# Patient Record
Sex: Male | Born: 1955
Health system: Southern US, Community
[De-identification: ages and names within clinical notes are randomized; demographics above are authoritative.]

## PROBLEM LIST (undated history)

## (undated) DIAGNOSIS — I10 Essential (primary) hypertension: Secondary | ICD-10-CM

## (undated) DIAGNOSIS — R51 Headache: Secondary | ICD-10-CM

## (undated) DIAGNOSIS — Z720 Tobacco use: Secondary | ICD-10-CM

## (undated) DIAGNOSIS — R519 Headache, unspecified: Secondary | ICD-10-CM

## (undated) DIAGNOSIS — N289 Disorder of kidney and ureter, unspecified: Secondary | ICD-10-CM

## (undated) DIAGNOSIS — E669 Obesity, unspecified: Secondary | ICD-10-CM

## (undated) DIAGNOSIS — E785 Hyperlipidemia, unspecified: Secondary | ICD-10-CM

## (undated) DIAGNOSIS — J189 Pneumonia, unspecified organism: Secondary | ICD-10-CM

## (undated) DIAGNOSIS — F1010 Alcohol abuse, uncomplicated: Secondary | ICD-10-CM

## (undated) DIAGNOSIS — D472 Monoclonal gammopathy: Secondary | ICD-10-CM

## (undated) DIAGNOSIS — H9190 Unspecified hearing loss, unspecified ear: Secondary | ICD-10-CM

## (undated) DIAGNOSIS — R600 Localized edema: Secondary | ICD-10-CM

## (undated) DIAGNOSIS — G4730 Sleep apnea, unspecified: Secondary | ICD-10-CM

## (undated) DIAGNOSIS — I509 Heart failure, unspecified: Secondary | ICD-10-CM

## (undated) DIAGNOSIS — K219 Gastro-esophageal reflux disease without esophagitis: Secondary | ICD-10-CM

## (undated) DIAGNOSIS — Z974 Presence of external hearing-aid: Secondary | ICD-10-CM

## (undated) DIAGNOSIS — K759 Inflammatory liver disease, unspecified: Secondary | ICD-10-CM

## (undated) HISTORY — DX: Monoclonal gammopathy: D47.2

## (undated) HISTORY — DX: Localized edema: R60.0

## (undated) HISTORY — DX: Disorder of kidney and ureter, unspecified: N28.9

## (undated) HISTORY — PX: OTHER SURGICAL HISTORY: SHX169

## (undated) HISTORY — PX: EYE SURGERY: SHX253

## (undated) HISTORY — PX: COLONOSCOPY: SHX174

## (undated) HISTORY — DX: Heart failure, unspecified: I50.9

---

## 1998-09-27 ENCOUNTER — Emergency Department (HOSPITAL_COMMUNITY): Admission: EM | Admit: 1998-09-27 | Discharge: 1998-09-27 | Payer: Self-pay | Admitting: Emergency Medicine

## 1998-09-27 ENCOUNTER — Encounter: Payer: Self-pay | Admitting: Emergency Medicine

## 2006-01-13 HISTORY — PX: OTHER SURGICAL HISTORY: SHX169

## 2006-01-25 ENCOUNTER — Inpatient Hospital Stay (HOSPITAL_COMMUNITY): Admission: RE | Admit: 2006-01-25 | Discharge: 2006-02-02 | Payer: Self-pay | Admitting: Orthopedic Surgery

## 2006-01-26 ENCOUNTER — Ambulatory Visit: Payer: Self-pay | Admitting: Infectious Diseases

## 2006-02-21 ENCOUNTER — Emergency Department (HOSPITAL_COMMUNITY): Admission: EM | Admit: 2006-02-21 | Discharge: 2006-02-21 | Payer: Self-pay | Admitting: Emergency Medicine

## 2006-02-22 ENCOUNTER — Ambulatory Visit (HOSPITAL_COMMUNITY): Admission: RE | Admit: 2006-02-22 | Discharge: 2006-02-22 | Payer: Self-pay | Admitting: Orthopedic Surgery

## 2011-12-17 DIAGNOSIS — I509 Heart failure, unspecified: Secondary | ICD-10-CM

## 2011-12-17 HISTORY — DX: Heart failure, unspecified: I50.9

## 2012-01-10 ENCOUNTER — Inpatient Hospital Stay (HOSPITAL_COMMUNITY)
Admission: EM | Admit: 2012-01-10 | Discharge: 2012-01-13 | DRG: 292 | Disposition: A | Discharge status: Home / Self Care | Payer: 59 | Attending: Internal Medicine | Admitting: Internal Medicine

## 2012-01-10 ENCOUNTER — Encounter (HOSPITAL_COMMUNITY): Payer: Self-pay | Admitting: Emergency Medicine

## 2012-01-10 ENCOUNTER — Emergency Department (HOSPITAL_COMMUNITY): Payer: Self-pay

## 2012-01-10 DIAGNOSIS — Z79899 Other long term (current) drug therapy: Secondary | ICD-10-CM

## 2012-01-10 DIAGNOSIS — N189 Chronic kidney disease, unspecified: Secondary | ICD-10-CM | POA: Diagnosis present

## 2012-01-10 DIAGNOSIS — N184 Chronic kidney disease, stage 4 (severe): Secondary | ICD-10-CM | POA: Diagnosis present

## 2012-01-10 DIAGNOSIS — E669 Obesity, unspecified: Secondary | ICD-10-CM

## 2012-01-10 DIAGNOSIS — E785 Hyperlipidemia, unspecified: Secondary | ICD-10-CM

## 2012-01-10 DIAGNOSIS — I129 Hypertensive chronic kidney disease with stage 1 through stage 4 chronic kidney disease, or unspecified chronic kidney disease: Secondary | ICD-10-CM | POA: Diagnosis present

## 2012-01-10 DIAGNOSIS — I5031 Acute diastolic (congestive) heart failure: Principal | ICD-10-CM | POA: Diagnosis present

## 2012-01-10 DIAGNOSIS — F17200 Nicotine dependence, unspecified, uncomplicated: Secondary | ICD-10-CM | POA: Diagnosis present

## 2012-01-10 DIAGNOSIS — IMO0002 Reserved for concepts with insufficient information to code with codable children: Secondary | ICD-10-CM

## 2012-01-10 DIAGNOSIS — N289 Disorder of kidney and ureter, unspecified: Secondary | ICD-10-CM

## 2012-01-10 DIAGNOSIS — F1010 Alcohol abuse, uncomplicated: Secondary | ICD-10-CM

## 2012-01-10 DIAGNOSIS — E119 Type 2 diabetes mellitus without complications: Secondary | ICD-10-CM | POA: Diagnosis present

## 2012-01-10 DIAGNOSIS — I10 Essential (primary) hypertension: Secondary | ICD-10-CM

## 2012-01-10 DIAGNOSIS — D649 Anemia, unspecified: Secondary | ICD-10-CM

## 2012-01-10 DIAGNOSIS — I509 Heart failure, unspecified: Secondary | ICD-10-CM

## 2012-01-10 DIAGNOSIS — M109 Gout, unspecified: Secondary | ICD-10-CM | POA: Diagnosis present

## 2012-01-10 DIAGNOSIS — Z720 Tobacco use: Secondary | ICD-10-CM

## 2012-01-10 HISTORY — DX: Hyperlipidemia, unspecified: E78.5

## 2012-01-10 HISTORY — DX: Alcohol abuse, uncomplicated: F10.10

## 2012-01-10 HISTORY — DX: Tobacco use: Z72.0

## 2012-01-10 HISTORY — DX: Essential (primary) hypertension: I10

## 2012-01-10 LAB — CBC
HCT: 35.9 % — ABNORMAL LOW (ref 39.0–52.0)
HCT: 36.9 % — ABNORMAL LOW (ref 39.0–52.0)
Hemoglobin: 11.6 g/dL — ABNORMAL LOW (ref 13.0–17.0)
Hemoglobin: 11.8 g/dL — ABNORMAL LOW (ref 13.0–17.0)
MCH: 24.4 pg — ABNORMAL LOW (ref 26.0–34.0)
MCH: 24.3 pg — ABNORMAL LOW (ref 26.0–34.0)
MCHC: 32.3 g/dL (ref 30.0–36.0)
MCHC: 32 g/dL (ref 30.0–36.0)
MCV: 75.9 fL — ABNORMAL LOW (ref 78.0–100.0)
Platelets: 202 10*3/uL (ref 150–400)
RDW: 15.2 % (ref 11.5–15.5)
RDW: 15.2 % (ref 11.5–15.5)

## 2012-01-10 LAB — BASIC METABOLIC PANEL
BUN: 15 mg/dL (ref 6–23)
Creatinine, Ser: 2.62 mg/dL — ABNORMAL HIGH (ref 0.50–1.35)
Glucose, Bld: 219 mg/dL — ABNORMAL HIGH (ref 70–99)
Potassium: 3.8 mEq/L (ref 3.5–5.1)
Sodium: 134 mEq/L — ABNORMAL LOW (ref 135–145)

## 2012-01-10 LAB — CARDIAC PANEL(CRET KIN+CKTOT+MB+TROPI)
CK, MB: 2.6 ng/mL (ref 0.3–4.0)
Troponin I: <0.3 ng/mL (ref <0.30)

## 2012-01-10 LAB — DIFFERENTIAL
Lymphocytes Relative: 13 % (ref 12–46)
Neutrophils Relative %: 77 % (ref 43–77)

## 2012-01-10 LAB — HEPATIC FUNCTION PANEL
ALT: 39 U/L (ref 0–53)
Bilirubin, Direct: 0.1 mg/dL (ref 0.0–0.3)
Indirect Bilirubin: 0.6 mg/dL (ref 0.3–0.9)
Total Protein: 7.1 g/dL (ref 6.0–8.3)

## 2012-01-10 LAB — TROPONIN I: Troponin I: <0.3 ng/mL (ref <0.30)

## 2012-01-10 LAB — URINE MICROSCOPIC-ADD ON

## 2012-01-10 LAB — CREATININE, SERUM: GFR calc non Af Amer: 25 mL/min — ABNORMAL LOW (ref >90)

## 2012-01-10 LAB — URINALYSIS, ROUTINE W REFLEX MICROSCOPIC
Leukocytes, UA: NEGATIVE
Protein, ur: >300 mg/dL — AB
Urobilinogen, UA: 1 mg/dL (ref 0.0–1.0)

## 2012-01-10 LAB — GLUCOSE, CAPILLARY: Glucose-Capillary: 217 mg/dL — ABNORMAL HIGH (ref 70–99)

## 2012-01-10 MED ORDER — ASPIRIN EC 81 MG PO TBEC
81.0000 mg | DELAYED_RELEASE_TABLET | Freq: Every day | ORAL | Status: DC
  Administered 2012-01-10 – 2012-01-13 (×4): 81 mg via ORAL
  Filled 2012-01-10 (×5): qty 1

## 2012-01-10 MED ORDER — FUROSEMIDE 10 MG/ML IJ SOLN
40.0000 mg | Freq: Once | INTRAMUSCULAR | Status: AC
  Administered 2012-01-10: 40 mg via INTRAVENOUS
  Filled 2012-01-10 (×2): qty 4

## 2012-01-10 MED ORDER — POTASSIUM CHLORIDE CRYS ER 20 MEQ PO TBCR
20.0000 meq | EXTENDED_RELEASE_TABLET | Freq: Two times a day (BID) | ORAL | Status: DC
  Administered 2012-01-10 – 2012-01-13 (×6): 20 meq via ORAL
  Filled 2012-01-10 (×9): qty 1

## 2012-01-10 MED ORDER — FUROSEMIDE 10 MG/ML IJ SOLN
40.0000 mg | Freq: Two times a day (BID) | INTRAMUSCULAR | Status: DC
  Administered 2012-01-11 – 2012-01-12 (×3): 40 mg via INTRAVENOUS
  Filled 2012-01-10 (×5): qty 4

## 2012-01-10 MED ORDER — VITAMIN B-1 100 MG PO TABS
100.0000 mg | ORAL_TABLET | Freq: Every day | ORAL | Status: DC
Start: 2012-01-10 — End: 2012-01-13
  Administered 2012-01-10 – 2012-01-13 (×4): 100 mg via ORAL
  Filled 2012-01-10 (×5): qty 1

## 2012-01-10 MED ORDER — COLCHICINE 0.6 MG PO TABS
0.6000 mg | ORAL_TABLET | Freq: Every day | ORAL | Status: DC
  Administered 2012-01-10 – 2012-01-13 (×4): 0.6 mg via ORAL
  Filled 2012-01-10 (×5): qty 1

## 2012-01-10 MED ORDER — SODIUM CHLORIDE 0.9 % IJ SOLN: 3.0000 mL | INTRAMUSCULAR | Status: DC | PRN

## 2012-01-10 MED ORDER — ONDANSETRON HCL 4 MG/2ML IJ SOLN: 4.0000 mg | Freq: Four times a day (QID) | INTRAMUSCULAR | Status: DC | PRN

## 2012-01-10 MED ORDER — SODIUM CHLORIDE 0.9 % IJ SOLN
3.0000 mL | Freq: Two times a day (BID) | INTRAMUSCULAR | Status: DC
  Administered 2012-01-10 – 2012-01-12 (×3): 3 mL via INTRAVENOUS
  Administered 2012-01-12: 12:00:00 via INTRAVENOUS
  Administered 2012-01-12 – 2012-01-13 (×2): 3 mL via INTRAVENOUS

## 2012-01-10 MED ORDER — ACETAMINOPHEN 325 MG PO TABS: 650.0000 mg | ORAL_TABLET | ORAL | Status: DC | PRN

## 2012-01-10 MED ORDER — LORAZEPAM 1 MG PO TABS: 1.0000 mg | ORAL_TABLET | ORAL | Status: DC | PRN

## 2012-01-10 MED ORDER — SODIUM CHLORIDE 0.9 % IV SOLN: Freq: Once | INTRAVENOUS | Status: DC

## 2012-01-10 MED ORDER — SODIUM CHLORIDE 0.9 % IV SOLN: 250.0000 mL | INTRAVENOUS | Status: DC | PRN

## 2012-01-10 MED ORDER — CARVEDILOL 3.125 MG PO TABS
3.1250 mg | ORAL_TABLET | Freq: Two times a day (BID) | ORAL | Status: DC
  Administered 2012-01-10 – 2012-01-12 (×4): 3.125 mg via ORAL
  Filled 2012-01-10 (×5): qty 1

## 2012-01-10 MED ORDER — ENOXAPARIN SODIUM 40 MG/0.4ML ~~LOC~~ SOLN
40.0000 mg | SUBCUTANEOUS | Status: DC
  Administered 2012-01-10 – 2012-01-12 (×3): 40 mg via SUBCUTANEOUS
  Filled 2012-01-10 (×5): qty 0.4

## 2012-01-10 MED ORDER — INSULIN ASPART 100 UNIT/ML ~~LOC~~ SOLN
0.0000 [IU] | Freq: Every day | SUBCUTANEOUS | Status: DC
  Administered 2012-01-10 – 2012-01-12 (×2): 3 [IU] via SUBCUTANEOUS

## 2012-01-10 MED ORDER — INSULIN ASPART 100 UNIT/ML ~~LOC~~ SOLN
0.0000 [IU] | Freq: Three times a day (TID) | SUBCUTANEOUS | Status: DC
  Administered 2012-01-11 (×2): 3 [IU] via SUBCUTANEOUS
  Administered 2012-01-11: 5 [IU] via SUBCUTANEOUS
  Administered 2012-01-12 (×3): 3 [IU] via SUBCUTANEOUS
  Administered 2012-01-13: 8 [IU] via SUBCUTANEOUS
  Administered 2012-01-13 (×2): 2 [IU] via SUBCUTANEOUS
  Filled 2012-01-10: qty 3

## 2012-01-10 MED ORDER — FOLIC ACID 1 MG PO TABS
1.0000 mg | ORAL_TABLET | Freq: Every day | ORAL | Status: DC
Start: 2012-01-10 — End: 2012-01-13
  Administered 2012-01-10 – 2012-01-13 (×4): 1 mg via ORAL
  Filled 2012-01-10 (×5): qty 1

## 2012-01-10 NOTE — H&P (Signed)
Hospital Admission Note Date: 01/10/2012  Patient name: [name] Medical record number: [id number] Date of birth: [birth year] Age: 56 y.o. Gender: male PCP: Gara Kroner, MD, MD  Attending physician: Jacquelynn Cree, MD Emergency Contact:  Kyah Rosser (wife) [phone] Code Status: Full  Chief Complaint: Shortness of breath  History of Present Illness: [name] is an 56 y.o. male with PMH of HTN, DM, and alcohol abuse who presented to the hospital with a 2 day history of the gradual onset dyspnea.  He noticed it for the first time when he was at work exerting himself by "running".  No associated fever, chills, but does endorse an occasional cough which is non-productive.  No associated chest pain or tightness.  He is now short of breath with mild exertion.  He has also noticed the recent onset of lower extremity edema.  Upon initial evaluation by the ED physician, he was noted to be non-compliant with his anti-hypertensive medication, and a CXR showed pulmonary edema.  He was referred to the hospitalist service for evaluation of his CHF.  Past Medical History Past Medical History  Diagnosis Date  . Diabetes mellitus   . Hypertension   . Hyperlipidemia   . Gout   . Alcohol abuse   . Tobacco abuse     Past Surgical History Past Surgical History  Procedure Date  . Left knee arthroscopy with generalized joint synovectomy, 01/2006  . Ganglion cyst removal x 2     Meds: Prior to Admission medications   Medication Sig Start Date End Date Taking? Authorizing Provider  colchicine 0.6 MG tablet Take 0.6 mg by mouth daily.   Yes Historical Provider, MD  indomethacin (INDOCIN) 50 MG capsule Take 50 mg by mouth 3 (three) times daily.   Yes Historical Provider, MD  metFORMIN (GLUCOPHAGE) 1000 MG tablet Take 1,000 mg by mouth daily.   Yes Historical Provider, MD  predniSONE (DELTASONE) 20 MG tablet Take 60 mg by mouth daily.   Yes Historical Provider, MD     Allergies: Penicillins  Social History: History   Social History  . Marital Status: Married    Spouse Name: [name]    Number of Children: 2  . Years of Education: 12   Occupational History  . Security    Social History Main Topics  . Smoking status: Current Everyday Smoker -- 0.3 packs/day for 33 years    Types: Cigarettes  . Smokeless tobacco: Never Used  . Alcohol Use: 21.0 oz/week    42 drink(s) per week  . Drug Use: No  . Sexually Active: Not on file   Other Topics Concern  . Not on file   Social History Narrative   Married, lives in [place] with wife.    Family History:  Family History  Problem Relation Age of Onset  . Lung cancer Father   . Diabetes Mother   . Heart disease Mother     Review of Systems: Constitutional: No fever or chills;  Appetite normal; No weight loss, steady weight gain.  HEENT: No blurry vision or diplopia, no pharyngitis or dysphagia CV: No chest pain or arrhythmia.  Resp: + SOB, + cough. GI: No N/V, no diarrhea, no melena or hematochezia.  GU: No dysuria or hematuria.  MSK: no myalgias/arthralgias.  Neuro:  No headache or focal neurological deficits.  Psych: No depression or anxiety.  Endo: No thyroid disease, + DM.  Skin: No rashes or lesions.  Heme: No anemia or blood dyscrasia   Physical Exam:  Blood pressure 163/104, pulse 94, temperature 98.5 F (36.9 C), temperature source Oral, resp. rate 24, SpO2 99.00%. BP 163/104  Pulse 94  Temp(Src) 98.5 F (36.9 C) (Oral)  Resp 24  SpO2 99%  General Appearance:    Alert, cooperative, no distress, appears stated age  Head:    Normocephalic, without obvious abnormality, atraumatic  Eyes:    PERRL, conjunctiva/corneas clear, EOM's intact  Ears:    Normal external ear canals, both ears  Nose:   Nares normal, septum midline, mucosa normal, no drainage    or sinus tenderness  Throat:   Lips, mucosa, and tongue normal; teeth and gums normal  Neck:   Supple, symmetrical, trachea midline, no  adenopathy;       thyroid:  No enlargement/tenderness/nodules; no carotid   bruit or JVD  Back:     Symmetric, no curvature, ROM normal, no CVA tenderness  Lungs:     Crackles bilaterally in bases, respirations unlabored  Chest wall:    No tenderness or deformity  Heart:    Regular rate and rhythm, S1 and S2 normal, no murmur, rub   or gallop  Abdomen:     Obese, soft, non-tender, bowel sounds active all four quadrants, no masses, no organomegaly  Extremities:   Extremities with 2+ edema  Pulses:   2+ and symmetric all extremities  Skin:   Skin color, texture, turgor normal, no rashes or lesions  Neurologic:   Non-focal   Lab results: Basic Metabolic Panel:  Lab [id number] 1438  NA 134*  K 3.8  CL 100  CO2 25  GLUCOSE 219*  BUN 15  CREATININE 2.62*  CALCIUM 8.9  MG --  PHOS --   GFR CrCl is unknown because there is no height on file for the current visit.  CBC:  Lab 01/10/12 1438  WBC 10.6*  NEUTROABS 8.1*  HGB 11.6*  HCT 35.9*  MCV 75.4*  PLT 202   Cardiac Enzymes:  Lab 01/10/12 1437  CKTOTAL --  CKMB --  CKMBINDEX --  TROPONINI <0.30   BNP:   Ref. Range 01/10/2012 14:36  Pro B Natriuretic peptide (BNP) Latest Range: 0-125 pg/mL 834.4 (H)   CBG:  Lab 01/10/12 1403  GLUCAP 217*    Imaging results:  Dg Chest Portable 1 View  01/10/2012  *RADIOLOGY REPORT*  Clinical Data: Shortness of breath since Saturday.  Coughing. History of hypertension.  History of diabetes.  Smoking.  PORTABLE CHEST - 1 VIEW  Comparison: 04/02/2008  Findings: The heart is enlarged.  There is central pulmonary edema. No focal consolidations or pleural effusions.  Visualized osseous structures have a normal appearance.  IMPRESSION: Cardiomegaly and pulmonary edema.  Original Report Authenticated By: Glenice Bow, M.D.    Assessment & Plan: Principal Problem:  *CHF (congestive heart failure) The patient likely has acute CHF (uncertain if this is systolic versus diastolic or a  combination of both). Will admit to telemetry, diurese with 40 mg of lasix IV Q 12 hours, and obtain a 2 D Echocardiogram for further evaluation.  He may have hypertensive cardiomyopathy related to his medical non-compliance, or alcohol related cardiomyopathy.  We will cycle cardiac markers Q 8 hours x 3 sets. Active Problems:  Hypertension Uncontrolled with history of medical non-compliance.  Will start on Lasix and Coreg.  Will hold off on ACE-I or ARB given current impairment in renal function.  Hyperlipidemia Not on any lipid lowering medicines.  Will check a FLP in the morning.  Gout Continue daily  colchicine.  Hold NSAIDS due to renal impairment.  Alcohol abuse We will monitor for signs of alcohol withdrawal.  Will provide thiamine and folic acid supplementation.  Tobacco abuse Will ask the nursing staff to provide tobacco cessation counseling.  Obesity Will ask the dietician to evaluate.  Will weigh to get BMI.  Chronic kidney disease Baseline renal function not known.  ? Component of acute versus CKD from uncontrolled HTN and/or DM.  Will monitor renal function closely, especially with diuresis.  Diabetes  Will check hemoglobin A1c and monitor.  Start SSI.  Hold Metformin for now.  Prophylaxis: Lovenox for DVT prophylaxis.  Time Spent On Admission: 1 hour.  Lynnett Langlinais 01/10/2012, 3:53 PM Pager [phone]

## 2012-01-10 NOTE — ED Provider Notes (Signed)
History     CSN: [id number]  Arrival date & time 01/10/12  1345   First MD Initiated Contact with Patient 01/10/12 1357      Chief Complaint  Patient presents with  . Shortness of Breath    (Consider location/radiation/quality/duration/timing/severity/associated sxs/prior treatment) Patient is a 56 y.o. male presenting with shortness of breath. The history is provided by the patient.  Shortness of Breath  Associated symptoms include shortness of breath.  He has noticed dyspnea for the last 2 days. It is worse with exertion and worse with laying flat. Last night he was unable to sleep. There is no associated chest pain, heaviness, tightness, or pressure. He denies nausea and vomiting but did have one episode of diaphoresis. He's had some mild left-sided abdominal discomfort which is improved with passing flatus. Symptoms are severe in that he cannot even walk across a room without getting dyspneic. He has not had similar problems before. He does have a history of hypertension diabetes and smokes cigars on average of one week. He went to see his PCP who referred him to the emergency department. Of note, the patient states that he is a heavy consumer of beer although not quantify how much. He also states he has not been taking his medication for at least several months.  Past Medical History  Diagnosis Date  . Diabetes mellitus   . Hypertension   . Hyperlipidemia     No past surgical history on file.  No family history on file.  History  Substance Use Topics  . Smoking status: Not on file  . Smokeless tobacco: Not on file  . Alcohol Use:       Review of Systems  Respiratory: Positive for shortness of breath.   All other systems reviewed and are negative.    Allergies  Penicillins  Home Medications  No current outpatient prescriptions on file.  BP 163/104  Pulse 94  Temp(Src) 98.5 F (36.9 C) (Oral)  Resp 24  SpO2 99%  Physical Exam  Nursing note and vitals  reviewed.  56 year old male who appears dyspneic at rest. He is obese. Vital signs are significant for tachypnea with respiratory rate of 24 and hypertension with blood pressure 163/104. Oxygen saturation is 95% which is normal. Head is normocephalic and atraumatic. PERRLA, EOMI. Oropharynx is clear. Neck is supple with out adenopathy. JVD is present at 90. Back has a 1+ presacral edema. Lungs are clear without rales, wheezes, or rhonchi. Heart has regular rate and rhythm without murmur. There is no chest wall tenderness. Abdomen is obese, soft, nontender without masses or hepatosplenomegaly. Easily reducible small umbilical hernia is present. Extremities have 2+ edema without cyanosis her toe range of motion is present. Skin is warm and dry without rash. Neurologic: Mental status is normal, cranial nerves are intact, there no focal motor or sensory deficits.  ED Course  Procedures (including critical care time)   Labs Reviewed  URINALYSIS, ROUTINE W REFLEX MICROSCOPIC  CBC  DIFFERENTIAL  BASIC METABOLIC PANEL  PRO B NATRIURETIC PEPTIDE  TROPONIN I   No results found.   Date: 01/10/2012  Rate: 100  Rhythm: normal sinus rhythm  QRS Axis: normal  Intervals: normal  ST/T Wave abnormalities: nonspecific T wave changes  Conduction Disutrbances:none  Narrative Interpretation: Low voltage QRS. Nonspecific diffuse T wave flattening. No old ECG available for comparison.  Old EKG Reviewed: none available  Chest x-ray confirms congestive heart failure with pulmonary edema. He is given a dose of Lasix intravenously. BNP  has come back elevated but not as high as I would've expected. He is also noted to have renal insufficiency and anemia which will need further evaluation. Case is discussed with Dr. Rockne Menghini who agrees to admit the patient.  1. CHF (congestive heart failure)   2. Anemia   3. Renal insufficiency   4. Hypertension       MDM  Exertional dyspnea which seems most likely to be due to  congestive heart failure. He shows definite evidence of fluid overload and neck vein distention as well. Cardiac markers, chest x-ray, ECG, and electrolyte panel will be checked.        Delora Fuel, MD [id number]

## 2012-01-10 NOTE — ED Notes (Signed)
Per EMS pt from DR office who had sudden SOB that started Saturday and is only SOB when he walks. Pt denies chest pain at this time. EKG normal sinus with PVC. Hypertensive at 172/109. CBG 223. Sats 95% on room air. Pt in no acute distress.

## 2012-01-10 NOTE — ED Notes (Signed)
CBG 186 

## 2012-01-10 NOTE — ED Notes (Signed)
Attempted to call report to floor, RN unable to take report at this time, states she has to discharge two patients and will then call me back.

## 2012-01-11 ENCOUNTER — Other Ambulatory Visit: Payer: Self-pay

## 2012-01-11 ENCOUNTER — Inpatient Hospital Stay (HOSPITAL_COMMUNITY): Payer: Self-pay

## 2012-01-11 LAB — LIPID PANEL
HDL: 49 mg/dL (ref >39)
LDL Cholesterol: 138 mg/dL — ABNORMAL HIGH (ref 0–99)
Total CHOL/HDL Ratio: 5 RATIO
Triglycerides: 289 mg/dL — ABNORMAL HIGH (ref <150)

## 2012-01-11 LAB — BASIC METABOLIC PANEL
BUN: 16 mg/dL (ref 6–23)
CO2: 26 mEq/L (ref 19–32)
Calcium: 9.3 mg/dL (ref 8.4–10.5)
Chloride: 100 mEq/L (ref 96–112)
Creatinine, Ser: 2.73 mg/dL — ABNORMAL HIGH (ref 0.50–1.35)
Glucose, Bld: 178 mg/dL — ABNORMAL HIGH (ref 70–99)

## 2012-01-11 LAB — CARDIAC PANEL(CRET KIN+CKTOT+MB+TROPI)
CK, MB: 2.5 ng/mL (ref 0.3–4.0)
Relative Index: 1.2 (ref 0.0–2.5)
Total CK: 209 U/L (ref 7–232)
Total CK: 190 U/L (ref 7–232)
Troponin I: <0.3 ng/mL (ref <0.30)
Troponin I: <0.3 ng/mL (ref <0.30)

## 2012-01-11 LAB — GLUCOSE, CAPILLARY: Glucose-Capillary: 223 mg/dL — ABNORMAL HIGH (ref 70–99)

## 2012-01-11 MED ORDER — INSULIN ASPART PROT & ASPART (70-30 MIX) 100 UNIT/ML ~~LOC~~ SUSP
10.0000 [IU] | Freq: Two times a day (BID) | SUBCUTANEOUS | Status: DC
  Administered 2012-01-11 – 2012-01-12 (×2): 10 [IU] via SUBCUTANEOUS
  Filled 2012-01-11: qty 3

## 2012-01-11 MED ORDER — ZOLPIDEM TARTRATE 5 MG PO TABS
5.0000 mg | ORAL_TABLET | Freq: Every evening | ORAL | Status: DC | PRN
  Administered 2012-01-11 – 2012-01-13 (×2): 5 mg via ORAL
  Filled 2012-01-11 (×2): qty 1

## 2012-01-11 MED ORDER — SIMVASTATIN 20 MG PO TABS
20.0000 mg | ORAL_TABLET | Freq: Every day | ORAL | Status: DC
  Administered 2012-01-11 – 2012-01-13 (×3): 20 mg via ORAL
  Filled 2012-01-11 (×4): qty 1

## 2012-01-11 NOTE — Progress Notes (Signed)
   CARE MANAGEMENT NOTE 01/11/2012  Patient:  KVION, LELL   Account Number:  [id number]  Date Initiated:  01/11/2012  Documentation initiated by:  [facility]  Subjective/Objective Assessment:   ADMITTED W/SOB.     Action/Plan:   FROM HOME W/SPOUSE.HAS PCP,PHARMACY,TRANSP.   Anticipated DC Date:  01/13/2012   Anticipated DC Plan:  [place]  CM consult  Medication Assistance      Choice offered to / List presented to:  C-1 Patient           Status of service:  In process, will continue to follow Medicare Important Message given?   (If response is "NO", the following Medicare IM given date fields will be blank) Date Medicare IM given:   Date Additional Medicare IM given:    Discharge Disposition:    Per UR Regulation:  Reviewed for med. necessity/level of care/duration of stay  Comments:  01/11/12 Maryon Kemnitz RN,BSN NCM [phone] SPOKE TO PATIENT ABOUT HHC.[facility] CHOSEN TO FOLLOW.PATIENT HAS QUES ABOUT COST,SERVICES.SUSAN DALE INFORMED & WILL TALK TO PATIENT ABOUT COST,& FINANCIAL FORM.RECOMMEND HHRN,CSW @ D/C IF MD,& PATIENT AGREE.QUALIFIES FOR INDIGENT FUNDS IF NEEDED.PROVIDED W/$4 [facility] MED LIST.

## 2012-01-11 NOTE — Progress Notes (Addendum)
Subjective: "I am feeling a whole lot better". Pt ambulating in room , gait steady, no SOB. Denies chest pain  Objective: Vital signs Filed Vitals:   01/10/12 1801 01/10/12 2045 01/10/12 2115 01/11/12 0436  BP: 156/101 176/110 153/91 129/85  Pulse: 91 85 98 80  Temp: 98.6 F (37 C) 97.6 F (36.4 C) 99.2 F (37.3 C) 98.4 F (36.9 C)  TempSrc: Oral Oral Oral Oral  Resp: 18 20 20 18   Height: 6' (1.829 m)     Weight: 143.654 kg (316 lb 11.2 oz)   139 kg (306 lb 7 oz)  SpO2: 95%  96% 94%   Weight change:  Last BM Date: 01/10/12  Intake/Output from previous day: 02/25 0701 - 02/26 0700 In: 240 [P.O.:240] Out: -  Total I/O In: 640 [P.O.:640] Out: -    Physical Exam: General: Alert, awake, oriented x3, in no acute distress.  HEENT: No bruits, no goiter. PERRL. Poor dentition. Mucus membranes mouth moist/pink Heart: Regular rate and rhythm, without murmurs, rubs, gallops. Lungs: Normal effort. Breath sounds with very fine crackles bases particularly on left otherwise clear to auscultation bilaterally. Abdomen: Obese, Soft, nontender, nondistended, positive bowel sounds. Extremities: No clubbing cyanosis  with positive pedal pulses. Trace to 1+LEE Neuro: Grossly intact, nonfocal. Speech clear. Facial symmetry    Lab Results: Basic Metabolic Panel:  Basename 01/11/12 0439 01/10/12 1845 01/10/12 1438  NA 135 -- 134*  K 3.8 -- 3.8  CL 100 -- 100  CO2 26 -- 25  GLUCOSE 178* -- 219*  BUN 16 -- 15  CREATININE 2.73* 2.68* --  CALCIUM 9.3 -- 8.9  MG -- -- --  PHOS -- -- --   Liver Function Tests:  Basename 01/10/12 1845  AST 22  ALT 39  ALKPHOS 89  BILITOT 0.7  PROT 7.1  ALBUMIN 2.6*   No results found for this basename: LIPASE:2,AMYLASE:2 in the last 72 hours No results found for this basename: AMMONIA:2 in the last 72 hours CBC:  Basename 01/10/12 1845 01/10/12 1438  WBC 10.5 10.6*  NEUTROABS -- 8.1*  HGB 11.8* 11.6*  HCT 36.9* 35.9*  MCV 75.9* 75.4*  PLT  206 202   Cardiac Enzymes:  Basename 01/11/12 0752 01/11/12 0025 01/10/12 1627  CKTOTAL 190 209 223  CKMB 2.3 2.5 2.6  CKMBINDEX -- -- --  TROPONINI <0.30 <0.30 <0.30   BNP:  Basename 01/10/12 1436  PROBNP 834.4*   D-Dimer: No results found for this basename: DDIMER:2 in the last 72 hours CBG:  Basename 01/11/12 1123 01/11/12 0717 01/10/12 2024 01/10/12 1711 01/10/12 1403  GLUCAP 223* 196* 258* 186* 217*   Hemoglobin A1C: No results found for this basename: HGBA1C in the last 72 hours Fasting Lipid Panel:  Basename 01/11/12 0439  CHOL 245*  HDL 49  LDLCALC 138*  TRIG 289*  CHOLHDL 5.0  LDLDIRECT --   Thyroid Function Tests:  Basename 01/10/12 1845  TSH 3.289  T4TOTAL --  FREET4 --  T3FREE --  THYROIDAB --   Anemia Panel: No results found for this basename: VITAMINB12,FOLATE,FERRITIN,TIBC,IRON,RETICCTPCT in the last 72 hours Coagulation: No results found for this basename: LABPROT:2,INR:2 in the last 72 hours Urine Drug Screen: Drugs of Abuse  No results found for this basename: labopia, cocainscrnur, labbenz, amphetmu, thcu, labbarb    Alcohol Level: No results found for this basename: ETH:2 in the last 72 hours Urinalysis:  Basename 01/10/12 1404  COLORURINE YELLOW  LABSPEC 1.023  PHURINE 6.0  GLUCOSEU >1000*  HGBUR TRACE*  BILIRUBINUR NEGATIVE  KETONESUR NEGATIVE  PROTEINUR >300*  UROBILINOGEN 1.0  NITRITE NEGATIVE  LEUKOCYTESUR NEGATIVE   Misc. Labs:  No results found for this or any previous visit (from the past 240 hour(s)).  Studies/Results: Dg Chest Portable 1 View  01/10/2012  *RADIOLOGY REPORT*  Clinical Data: Shortness of breath since Saturday.  Coughing. History of hypertension.  History of diabetes.  Smoking.  PORTABLE CHEST - 1 VIEW  Comparison: 04/02/2008  Findings: The heart is enlarged.  There is central pulmonary edema. No focal consolidations or pleural effusions.  Visualized osseous structures have a normal appearance.   IMPRESSION: Cardiomegaly and pulmonary edema.  Original Report Authenticated By: Glenice Bow, M.D.    Medications: Scheduled Meds:   . aspirin EC  81 mg Oral Daily  . carvedilol  3.125 mg Oral BID WC  . colchicine  0.6 mg Oral Daily  . enoxaparin  40 mg Subcutaneous Q24H  . folic acid  1 mg Oral Daily  . furosemide  40 mg Intravenous Once  . furosemide  40 mg Intravenous Q12H  . insulin aspart  0-15 Units Subcutaneous TID WC  . insulin aspart  0-5 Units Subcutaneous QHS  . potassium chloride  20 mEq Oral BID  . sodium chloride  3 mL Intravenous Q12H  . thiamine  100 mg Oral Daily  . DISCONTD: sodium chloride   Intravenous Once   Continuous Infusions:  PRN Meds:.sodium chloride, acetaminophen, LORazepam, ondansetron (ZOFRAN) IV, sodium chloride  Assessment/Plan:  Principal Problem:  *CHF (congestive heart failure) Active Problems:  Hypertension  Hyperlipidemia  Gout  Alcohol abuse  Tobacco abuse  Obesity  Chronic kidney disease  Diabetes mellitus  *CHF (congestive heart failure)  The patient likely has acute CHF (uncertain if this is systolic versus diastolic or a combination of both). Improved. 2decho results pending.  He may have hypertensive cardiomyopathy related to his medical non-compliance, or alcohol related cardiomyopathy.  CE neg to date. Denies CP Will continue lasix, strict I&O's daily weights. Today's weight 139kg down from 143.6kg at admission. Will evaluate and likely reduce lasix tomorrow.  Active Problems:  Hypertension  Uncontrolled with history of medical non-compliance. Will start on Lasix and Coreg. Will hold off on ACE-I or ARB given current impairment in renal function. SBP range 129-176. DBP range 110-85. Each successive reading has improved so will leave lasix and coreg as they are and evaluate BP. Will provide prn hydralazine. Hyperlipidemia  Not on any lipid lowering medicines. Will add statin. Elevated total chol and triglyserides  Gout    Continue daily colchicine. Hold NSAIDS due to renal impairment.  Alcohol abuse  We will monitor for signs of alcohol withdrawal. Will provide thiamine and folic acid supplementation.  Tobacco abuse  Will ask the nursing staff to provide tobacco cessation counseling.  Obesity  Will ask the dietician to evaluate. Will weigh to get BMI.  Chronic kidney disease  Baseline renal function not known. ? Component of acute versus CKD from uncontrolled HTN and/or DM. Will monitor renal function closely, especially with diuresis.  Diabetes   CBG range 186-258. Hemoglobin A1c pending. Start SSI. Hold Metformin for now. Will start low dose 70/30.  Prophylaxis: Lovenox for DVT prophylaxis.       LOS: 1 day   [facility] M 01/11/2012, 1:59 PM  Addendum: Patient seen and examined. Chart reviewed. Agree with Dyanne Carrel, NP.  Reports feeling better.  Also reports improvement in swelling.  Blood pressure improved, but still not adequately controlled, titrated up Coreg.  2-D echocardiogram done and pending.  Uncertain if the patient has underlying chronic kidney disease, will attempt to get records from primary care physician.  Will get renal ultrasound to evaluate for obstruction.  Hemoglobin A1c pending.  Depending on patient's hemoglobin A1c will likely determine if the patient will need insulin.  Given uncontrolled diabetes suspect patient will need insulin.  Will start the patient on 70/30 10 units subcutaneous and titrated up as tolerated.  LDL 138, given patient's risk factors not well controlled, start simvastatin.  Counseled the patient on smoking cessation.  Zeena Starkel A, MD 01/11/2012, 5:01 PM

## 2012-01-11 NOTE — Progress Notes (Signed)
Inpatient Diabetes Program Recommendations  AACE/ADA: New Consensus Statement on Inpatient Glycemic Control (2009)  Target Ranges:  Prepandial:   less than 140 mg/dL      Peak postprandial:   less than 180 mg/dL (1-2 hours)      Critically ill patients:  140 - 180 mg/dL   Reason for Visit: Elevated fasting glucose: 196 mg/dL  Inpatient Diabetes Program Recommendations Insulin - Basal: Consider adding Lantus 10 units daily

## 2012-01-11 NOTE — Progress Notes (Signed)
  Echocardiogram 2D Echocardiogram has been performed.  [name] 01/11/2012, 2:26 PM

## 2012-01-11 NOTE — Plan of Care (Signed)
Problem: Food- and Nutrition-Related Knowledge Deficit (NB-1.1) Goal: Nutrition education Formal process to instruct or train a patient/client in a skill or to impart knowledge to help patients/clients voluntarily manage or modify food choices and eating behavior to maintain or improve health.  Outcome: Completed/Met Date Met:  01/11/12 Pt and wife request education for DM management.  Reviewed DM basics with pt as wife was unavailable at this time.  Pt is motivated to control his diabetes better.  Provided handout "Nutrition for Type 2 Diabetes."  All questions answered.  Developed a meal plan with pt.  Pt verbalized understanding.  Encouraged pt to contact RD via nursing staff if he or wife have additional questions.

## 2012-01-12 DIAGNOSIS — I509 Heart failure, unspecified: Secondary | ICD-10-CM

## 2012-01-12 LAB — CBC
HCT: 40.7 % (ref 39.0–52.0)
MCH: 24.3 pg — ABNORMAL LOW (ref 26.0–34.0)
MCV: 76.6 fL — ABNORMAL LOW (ref 78.0–100.0)
RBC: 5.31 MIL/uL (ref 4.22–5.81)
WBC: 7.2 10*3/uL (ref 4.0–10.5)

## 2012-01-12 LAB — GLUCOSE, CAPILLARY
Glucose-Capillary: 163 mg/dL — ABNORMAL HIGH (ref 70–99)
Glucose-Capillary: 177 mg/dL — ABNORMAL HIGH (ref 70–99)

## 2012-01-12 LAB — BASIC METABOLIC PANEL
CO2: 29 mEq/L (ref 19–32)
Chloride: 101 mEq/L (ref 96–112)
Creatinine, Ser: 3.16 mg/dL — ABNORMAL HIGH (ref 0.50–1.35)
Glucose, Bld: 185 mg/dL — ABNORMAL HIGH (ref 70–99)
Sodium: 140 mEq/L (ref 135–145)

## 2012-01-12 MED ORDER — BD GETTING STARTED TAKE HOME KIT: 3/10ML X 30G SYRINGES
1.0000 | Freq: Once | Status: AC
  Administered 2012-01-12: 1
  Filled 2012-01-12: qty 1

## 2012-01-12 MED ORDER — CARVEDILOL 6.25 MG PO TABS
6.2500 mg | ORAL_TABLET | Freq: Two times a day (BID) | ORAL | Status: DC
  Administered 2012-01-12 – 2012-01-13 (×3): 6.25 mg via ORAL
  Filled 2012-01-12 (×5): qty 1

## 2012-01-12 MED ORDER — INSULIN ASPART PROT & ASPART (70-30 MIX) 100 UNIT/ML ~~LOC~~ SUSP
13.0000 [IU] | Freq: Two times a day (BID) | SUBCUTANEOUS | Status: DC
  Administered 2012-01-12 – 2012-01-13 (×2): 13 [IU] via SUBCUTANEOUS
  Filled 2012-01-12: qty 3

## 2012-01-12 MED ORDER — FUROSEMIDE 40 MG PO TABS
40.0000 mg | ORAL_TABLET | Freq: Two times a day (BID) | ORAL | Status: DC
  Administered 2012-01-12: 40 mg via ORAL
  Filled 2012-01-12 (×4): qty 1

## 2012-01-12 MED ORDER — FUROSEMIDE 40 MG PO TABS
40.0000 mg | ORAL_TABLET | Freq: Every day | ORAL | Status: DC
  Administered 2012-01-13: 40 mg via ORAL
  Filled 2012-01-12 (×2): qty 1

## 2012-01-12 MED ORDER — LIVING WELL WITH DIABETES BOOK
Freq: Once | Status: AC
  Administered 2012-01-12: 17:00:00
  Filled 2012-01-12: qty 1

## 2012-01-12 NOTE — Progress Notes (Signed)
Inpatient Diabetes Program Recommendations  AACE/ADA: New Consensus Statement on Inpatient Glycemic Control (2009)  Target Ranges:  Prepandial:   less than 140 mg/dL      Peak postprandial:   less than 180 mg/dL (1-2 hours)      Critically ill patients:  140 - 180 mg/dL   Reason for Visit: Hyperglycemia / Consult  Results for [name], [name] (MRN [id number]) as of 01/12/2012 16:28  Ref. Range 01/10/2012 14:38 01/11/2012 04:39 01/12/2012 04:43  Glucose Latest Range: 70-99 mg/dL 219 (H) 178 (H) 185 (H)  Results for [name], [name] (MRN [id number]) as of 01/12/2012 16:28  Ref. Range 01/11/2012 17:42 01/11/2012 19:58 01/12/2012 00:10 01/12/2012 07:41 01/12/2012 12:18  Glucose-Capillary Latest Range: 70-99 mg/dL 154 (H) 245 (H) 163 (H) 177 (H) 173 (H)    Recommend increasing 70/30 insulin to 15 units bid.  Please add CHO-mod med to diet.    Note: Pt will need prescription for glucose meter and supplies.  Encourage him to view videos on pt ed channel and ordered Living Well With Diabetes book.  Discussed importance of controlling blood sugars at home.  If pt to go home on insulin, will order insulin starter kit and RN to instruct on administration.  Pt to followup with [facility] PCP in the next week or two.  Answered questions.  Pt appears motivated to make changes and control blood sugars.

## 2012-01-12 NOTE — Consult Note (Signed)
Patient ID: [name] MRN: [id number] DOB/AGE: June 30, 1956 56 y.o.  Admit date: 01/10/2012 Referring Physician: Sherryl Manges, MD Primary Physician: [facility] with [facility] Primary Cardiologist: New to [facility] Reason for Consultation: Congestive heart failure  HPI: 56 y.o. male with history of HTN, DM, HLD, gout, tobacco abuse  and alcohol abuse who presented to the hospital with a 2 day history of the gradual onset dyspnea. He noticed it for the first time when he was at work exerting himself. No associated fever, chills, but does endorse an occasional cough which is non-productive. No associated chest pain or tightness. He had been out of his medications when he lost his job and was trying to save money. His CXR showed pulmonary edema. BNP 834. BP 163/104 at time of admission. Echo with LVEF of 60-65%. Cardiac enzymes negative. He has diuresed well over the last 48 hours. He was found to have renal insufficiency on admission with acute worsening with diuresis. Cardiology consulted for cardiac recommendations.   He tells me today that he feels much better. Breathing is better. No chest pain. He is anxious to go home. Will take all meds and stop smoking.    Past Medical History  Diagnosis Date  . Diabetes mellitus   . Hypertension   . Hyperlipidemia   . Gout   . Alcohol abuse   . Tobacco abuse     Family History  Problem Relation Age of Onset  . Lung cancer Father   . Diabetes Mother   . Heart disease Mother     History   Social History  . Marital Status: Married    Spouse Name: [name]    Number of Children: 2  . Years of Education: 12   Occupational History  . Security    Social History Main Topics  . Smoking status: Current Everyday Smoker -- 0.3 packs/day for 33 years    Types: Cigarettes  . Smokeless tobacco: Never Used  . Alcohol Use: 21.0 oz/week    42 drink(s) per week  . Drug Use: No  . Sexually Active: No   Other Topics Concern  . Not on file    Social History Narrative   Married, lives in [place] with wife.    Past Surgical History  Procedure Date  . Left knee arthroscopy with generalized joint synovectomy, 01/2006  . Ganglion cyst removal x 2     Allergies  Allergen Reactions  . Vioxx (Rofecoxib) Other (See Comments)    Gi upset.  Marland Kitchen Penicillins     Prescriptions prior to admission  Medication Sig Dispense Refill  . colchicine 0.6 MG tablet Take 0.6 mg by mouth daily.      . indomethacin (INDOCIN) 50 MG capsule Take 50 mg by mouth 3 (three) times daily.      . metFORMIN (GLUCOPHAGE) 1000 MG tablet Take 1,000 mg by mouth daily.      . predniSONE (DELTASONE) 20 MG tablet Take 60 mg by mouth daily.        Inpatient Meds:     . aspirin EC  81 mg Oral Daily  . bd getting started take home kit  1 kit Other Once  . carvedilol  6.25 mg Oral BID WC  . colchicine  0.6 mg Oral Daily  . enoxaparin  40 mg Subcutaneous Q24H  . folic acid  1 mg Oral Daily  . furosemide  40 mg Oral Daily  . insulin aspart  0-15 Units Subcutaneous TID WC  . insulin aspart  0-5 Units Subcutaneous QHS  . insulin aspart protamine-insulin aspart  13 Units Subcutaneous BID WC  . living well with diabetes book   Does not apply Once  . potassium chloride  20 mEq Oral BID  . simvastatin  20 mg Oral q1800  . sodium chloride  3 mL Intravenous Q12H  . thiamine  100 mg Oral Daily  . DISCONTD: carvedilol  3.125 mg Oral BID WC  . DISCONTD: furosemide  40 mg Intravenous Q12H  . DISCONTD: furosemide  40 mg Oral BID  . DISCONTD: insulin aspart protamine-insulin aspart  10 Units Subcutaneous BID WC     Review of systems complete and found to be negative unless listed above    Physical Exam: Blood pressure 133/87, pulse 82, temperature 97.8 F (36.6 C), temperature source Oral, resp. rate 18, height 6' (1.829 m), weight 308 lb 3.3 oz (139.8 kg), SpO2 96.00%.    General: Well developed, well nourished, NAD  HEENT: OP clear, mucus membranes moist  SKIN:  warm, dry. No rashes.  Neuro: No focal deficits  Musculoskeletal: Muscle strength 5/5 all ext  Psychiatric: Mood and affect normal  Neck: No JVD, no carotid bruits, no thyromegaly, no lymphadenopathy.  Lungs:Clear bilaterally, no wheezes, rhonci, crackles  Cardiovascular: Regular rate and rhythm. No murmurs, gallops or rubs.  Abdomen:Soft. Bowel sounds present. Non-tender.  Extremities: No lower extremity edema. Pulses are 2 + in the bilateral DP/PT.   Labs:   Lab Results  Component Value Date   WBC 7.2 01/12/2012   HGB 12.9* 01/12/2012   HCT 40.7 01/12/2012   MCV 76.6* 01/12/2012   PLT 237 01/12/2012    Lab 01/12/12 0443 01/10/12 1845  NA 140 --  K 4.0 --  CL 101 --  CO2 29 --  BUN 23 --  CREATININE 3.16* --  CALCIUM 9.6 --  PROT -- 7.1  BILITOT -- 0.7  ALKPHOS -- 89  ALT -- 39  AST -- 22  GLUCOSE 185* --   Lab Results  Component Value Date   CKTOTAL 190 01/11/2012   CKMB 2.3 01/11/2012   TROPONINI <0.30 01/11/2012   Echo: 01/11/12:  Left ventricle: The cavity size was normal. There was moderate concentric hypertrophy. Systolic function was normal. The estimated ejection fraction was in the range of 60% to 65%. Wall motion was normal; there were no regional wall motion abnormalities. Doppler parameters are consistent with abnormal left ventricular relaxation (grade 1 diastolic dysfunction). The E/e' ratio is >10, suggesting elevated LV filling pressure. - Left atrium: The atrium was mildly dilated.    EKG:  ASSESSMENT AND PLAN:   1. Volume overload/acute diastolic heart failure: Pulmonary edema much improved with diuresis. This most likely represents acute diastolic heart failure. He has no known CAD but has multiple cardiac risk factors including HTN, DM, HLD and tobacco abuse. I agree with the current medication regimen. May need to hold Lasix with acute worsening of renal function. We will follow him in our office. He will likely need a stress myoview as an  outpatient to exclude CAD. I suspect that he has some underlying CAD. With good control of BP, we can hopefully prevent future episodes of CHF.     SignedLauree Chandler 01/12/2012, 5:47 PM

## 2012-01-12 NOTE — Plan of Care (Signed)
Problem: Food- and Nutrition-Related Knowledge Deficit (NB-1.1) Goal: Nutrition education Formal process to instruct or train a patient/client in a skill or to impart knowledge to help patients/clients voluntarily manage or modify food choices and eating behavior to maintain or improve health.  Outcome: Completed/Met Date Met:  01/12/12 Pt stopped RD while walking by room and asks questions about Heart Healthy diet.  Pt states that he wants to make overall, widespread changes to his diet.  Review Low Sodium Nutrition Therapy with pt and discussed barriers to compliance.  Pt highly motivated.  Discussed small, progressive, change.  All questions answered at this time.  Expect good compliance.

## 2012-01-12 NOTE — Progress Notes (Signed)
Subjective: Up in chair watching TV. Denies CP/SOB.   Objective: Vital signs Filed Vitals:   01/11/12 0436 01/11/12 1521 01/11/12 2159 01/12/12 0438  BP: 129/85 162/90 123/81 157/91  Pulse: 80 74 77 71  Temp: 98.4 F (36.9 C) 97.7 F (36.5 C) 98.2 F (36.8 C) 98.1 F (36.7 C)  TempSrc: Oral Oral Oral Oral  Resp: 18 18 20 18   Height:      Weight: 139 kg (306 lb 7 oz)   139.8 kg (308 lb 3.3 oz)  SpO2: 94% 97% 93% 97%   Weight change: -3.854 kg (-8 lb 8 oz) Last BM Date: 01/11/12  Intake/Output from previous day: 02/26 0701 - 02/27 0700 In: 1532 [P.O.:1520; IV Piggyback:12] Out: -      Physical Exam: General: Alert, awake, oriented x3, in no acute distress. HEENT: No bruits, no goiter. Mucus membranes slightly dry/pink. Poor dentition Heart: Regular rate and rhythm, without murmurs, rubs, gallops. Lungs:Normal effort. Breath sounds somewhat distant with fine crackles right base otherwise clear to auscultation bilaterally. No wheeze, rhonchi Abdomen:Obese, Soft, nontender, nondistended, positive bowel sounds. Extremities: No clubbing cyanosis with positive pedal pulses. Trace to 1+ LEE Neuro: Grossly intact, nonfocal. Speech clear    Lab Results: Basic Metabolic Panel:  Basename 01/12/12 0443 01/11/12 0439  NA 140 135  K 4.0 3.8  CL 101 100  CO2 29 26  GLUCOSE 185* 178*  BUN 23 16  CREATININE 3.16* 2.73*  CALCIUM 9.6 9.3  MG -- --  PHOS -- --   Liver Function Tests:  Basename 01/10/12 1845  AST 22  ALT 39  ALKPHOS 89  BILITOT 0.7  PROT 7.1  ALBUMIN 2.6*   No results found for this basename: LIPASE:2,AMYLASE:2 in the last 72 hours No results found for this basename: AMMONIA:2 in the last 72 hours CBC:  Basename 01/12/12 0443 01/10/12 1845 01/10/12 1438  WBC 7.2 10.5 --  NEUTROABS -- -- 8.1*  HGB 12.9* 11.8* --  HCT 40.7 36.9* --  MCV 76.6* 75.9* --  PLT 237 206 --   Cardiac Enzymes:  Basename 01/11/12 0752 01/11/12 0025 01/10/12 1627    CKTOTAL 190 209 223  CKMB 2.3 2.5 2.6  CKMBINDEX -- -- --  TROPONINI <0.30 <0.30 <0.30   BNP:  Basename 01/10/12 1436  PROBNP 834.4*   D-Dimer: No results found for this basename: DDIMER:2 in the last 72 hours CBG:  Basename 01/12/12 0741 01/12/12 0010 01/11/12 1958 01/11/12 1742 01/11/12 1123 01/11/12 0717  GLUCAP 177* 163* 245* 154* 223* 196*   Hemoglobin A1C: No results found for this basename: HGBA1C in the last 72 hours Fasting Lipid Panel:  Basename 01/11/12 0439  CHOL 245*  HDL 49  LDLCALC 138*  TRIG 289*  CHOLHDL 5.0  LDLDIRECT --   Thyroid Function Tests:  Basename 01/10/12 1845  TSH 3.289  T4TOTAL --  FREET4 --  T3FREE --  THYROIDAB --   Anemia Panel: No results found for this basename: VITAMINB12,FOLATE,FERRITIN,TIBC,IRON,RETICCTPCT in the last 72 hours Coagulation: No results found for this basename: LABPROT:2,INR:2 in the last 72 hours Urine Drug Screen: Drugs of Abuse  No results found for this basename: labopia, cocainscrnur, labbenz, amphetmu, thcu, labbarb    Alcohol Level: No results found for this basename: ETH:2 in the last 72 hours Urinalysis:  Basename 01/10/12 1404  COLORURINE YELLOW  LABSPEC 1.023  PHURINE 6.0  GLUCOSEU >1000*  HGBUR TRACE*  BILIRUBINUR NEGATIVE  KETONESUR NEGATIVE  PROTEINUR >300*  UROBILINOGEN 1.0  NITRITE NEGATIVE  LEUKOCYTESUR NEGATIVE   Misc. Labs:  No results found for this or any previous visit (from the past 240 hour(s)).  Studies/Results: US Renal  01/11/2012  *RADIOLOGY REPORT*  Clinical Data: Acute renal failure  RENAL/URINARY TRACT ULTRASOUND COMPLETE  Comparison:  None.  Findings:  Right Kidney:  Normal in size, measuring 13 cm in length, however there is mild thinning of the renal cortex with increased cortical echogenicity.  There is an approximately 1.3 x 1.1 x 1.5 cm anechoic cyst within the superior aspect of the right kidney.  No right-sided urinary obstruction.  No perinephric fluid.   Left Kidney:  The left kidney is normal in size, measuring 11.4 cm in length, however similar to the right, demonstrates mild thinning of the renal cortex with increased cortical echogenicity.  No discrete renal lesions.  No urinary obstruction.  No perinephric fluid.  Bladder:  Normal for degree of distension.  Bilateral ureteral jets are identified.  IMPRESSION: 1.  Findings suggestive of medical renal disease.  No urinary obstruction. 2.  Right-sided renal cyst.  Original Report Authenticated By: Rachel Moulds, M.D.   Dg Chest Portable 1 View  01/10/2012  *RADIOLOGY REPORT*  Clinical Data: Shortness of breath since Saturday.  Coughing. History of hypertension.  History of diabetes.  Smoking.  PORTABLE CHEST - 1 VIEW  Comparison: 04/02/2008  Findings: The heart is enlarged.  There is central pulmonary edema. No focal consolidations or pleural effusions.  Visualized osseous structures have a normal appearance.  IMPRESSION: Cardiomegaly and pulmonary edema.  Original Report Authenticated By: Glenice Bow, M.D.    Medications: Scheduled Meds:   . aspirin EC  81 mg Oral Daily  . carvedilol  3.125 mg Oral BID WC  . colchicine  0.6 mg Oral Daily  . enoxaparin  40 mg Subcutaneous Q24H  . folic acid  1 mg Oral Daily  . furosemide  40 mg Intravenous Q12H  . insulin aspart  0-15 Units Subcutaneous TID WC  . insulin aspart  0-5 Units Subcutaneous QHS  . insulin aspart protamine-insulin aspart  10 Units Subcutaneous BID WC  . potassium chloride  20 mEq Oral BID  . simvastatin  20 mg Oral q1800  . sodium chloride  3 mL Intravenous Q12H  . thiamine  100 mg Oral Daily   Continuous Infusions:  PRN Meds:.sodium chloride, acetaminophen, LORazepam, ondansetron (ZOFRAN) IV, sodium chloride, zolpidem  Assessment/Plan:  Principal Problem:  *CHF (congestive heart failure) Active Problems:  Hypertension  Hyperlipidemia  Gout  Alcohol abuse  Tobacco abuse  Obesity  Chronic kidney disease   Diabetes mellitus  *CHF (congestive heart failure)  The patient likely has acute CHF. 2decho  Yields EF 60-65% with grade 1 diastolic dysfunction. CE neg to date. Denies CP Will continue lasix but change to po.  strict I&O's daily weights. Today's weight 139.8kg up from 139 yesterday. Wt 143.6 at admission  .  LE edema improved Active Problems:  Hypertension   Improved but remains Uncontrolled. (history of medical non-compliance due to finances). Will continue Lasix and increase Coreg. Will hold off on ACE-I or ARB given current impairment in renal function. SBP range 123-162. DBP range 92-85.  Hyperlipidemia  Not on any lipid lowering medicines. Will add statin. Elevated total chol and triglyserides  Gout  Continue daily colchicine. Hold NSAIDS due to renal impairment.  Alcohol abuse  We will monitor for signs of alcohol withdrawal. Will provide thiamine and folic acid supplementation.  Tobacco abuse  Will ask the nursing  staff to provide tobacco cessation counseling.  Obesity  Will ask the dietician to evaluate. Will weigh to get BMI.  Chronic kidney disease  Baseline renal function not known. Spoke with PCP yesterday who reports most recent lab work 2010 and creatinine 1.3 at that time. This time frame matches time frame pt became unemployed and found it difficult to get meds. Renal US neg for obstruction.  ? Component of acute versus CKD from uncontrolled HTN and/or DM. Creatinine increased likely related to lasix as well as chronic component. Will change lasix to po. Monitor.   Diabetes  CBG 163, 177.  Hemoglobin A1c 9.8  Continue 70/30 and  SSI. Hold Metformin for now.     LOS: 2 days   [facility] M 01/12/2012, 8:41 AM  Addendum:  Have reviewed clinical data and personally examined patient. I concur with PA's assessment and plan, but in addition, the following are pertinent. 1. Acute diastolic CHF: Heart failure is better compensated, but this is a new diagnosis, and patient will  need cardiology input and follow up. Have consulted [facility], accordingly. 2. ARF-on-CK: Patient's baseline creatinine is unknown, but is certainly creeping up from 2.62 at admission. Diuretics may be contributory. Will decrease Lasix further, to 40 mg daily, and continue to avoid nephrotoxins. If Creatinine worsened by 01/13/12, will have to consult Nephrology. 3. DM-2: Now insulin-requiring. Control is sub-optimal. Will change Novolog 70/30, to 13 units b.i.d. Further titration may be needed.  Comment: Patient will need prescription for Glucometer at discharge.

## 2012-01-13 DIAGNOSIS — I10 Essential (primary) hypertension: Secondary | ICD-10-CM

## 2012-01-13 LAB — BASIC METABOLIC PANEL
BUN: 32 mg/dL — ABNORMAL HIGH (ref 6–23)
CO2: 28 mEq/L (ref 19–32)
Chloride: 98 mEq/L (ref 96–112)
Glucose, Bld: 124 mg/dL — ABNORMAL HIGH (ref 70–99)
Potassium: 3.6 mEq/L (ref 3.5–5.1)

## 2012-01-13 LAB — GLUCOSE, CAPILLARY
Glucose-Capillary: 148 mg/dL — ABNORMAL HIGH (ref 70–99)
Glucose-Capillary: 251 mg/dL — ABNORMAL HIGH (ref 70–99)
Glucose-Capillary: 145 mg/dL — ABNORMAL HIGH (ref 70–99)

## 2012-01-13 MED ORDER — CARVEDILOL 6.25 MG PO TABS: 6.2500 mg | ORAL_TABLET | Freq: Two times a day (BID) | ORAL | Status: DC

## 2012-01-13 MED ORDER — THIAMINE HCL 100 MG PO TABS: 100.0000 mg | ORAL_TABLET | Freq: Every day | ORAL | Status: AC

## 2012-01-13 MED ORDER — INSULIN ASPART PROT & ASPART (70-30 MIX) 100 UNIT/ML ~~LOC~~ SUSP
15.0000 [IU] | Freq: Two times a day (BID) | SUBCUTANEOUS | Status: DC
  Administered 2012-01-13: 15 [IU] via SUBCUTANEOUS

## 2012-01-13 MED ORDER — POTASSIUM CHLORIDE CRYS ER 20 MEQ PO TBCR: 20.0000 meq | EXTENDED_RELEASE_TABLET | Freq: Two times a day (BID) | ORAL | Status: DC

## 2012-01-13 MED ORDER — ASPIRIN 81 MG PO TBEC: 81.0000 mg | DELAYED_RELEASE_TABLET | Freq: Every day | ORAL | Status: AC

## 2012-01-13 MED ORDER — INSULIN ASPART PROT & ASPART (70-30 MIX) 100 UNIT/ML ~~LOC~~ SUSP: 15.0000 [IU] | Freq: Two times a day (BID) | SUBCUTANEOUS | Status: DC

## 2012-01-13 MED ORDER — FOLIC ACID 1 MG PO TABS: 1.0000 mg | ORAL_TABLET | Freq: Every day | ORAL | Status: AC

## 2012-01-13 MED ORDER — FUROSEMIDE 40 MG PO TABS: 40.0000 mg | ORAL_TABLET | Freq: Two times a day (BID) | ORAL | Status: DC

## 2012-01-13 MED ORDER — INSULIN ASPART 100 UNIT/ML ~~LOC~~ SOLN: 0.0000 [IU] | Freq: Every day | SUBCUTANEOUS | Status: DC

## 2012-01-13 MED ORDER — PRAVASTATIN SODIUM 40 MG PO TABS: 40.0000 mg | ORAL_TABLET | Freq: Every day | ORAL | Status: DC

## 2012-01-13 MED ORDER — INSULIN ASPART 100 UNIT/ML ~~LOC~~ SOLN: 0.0000 [IU] | Freq: Three times a day (TID) | SUBCUTANEOUS | Status: DC

## 2012-01-13 MED ORDER — AMLODIPINE BESYLATE 5 MG PO TABS: 5.0000 mg | ORAL_TABLET | Freq: Every day | ORAL | Status: DC

## 2012-01-13 MED ORDER — AMLODIPINE BESYLATE 5 MG PO TABS
5.0000 mg | ORAL_TABLET | Freq: Every day | ORAL | Status: DC
  Administered 2012-01-13: 5 mg via ORAL
  Filled 2012-01-13 (×2): qty 1

## 2012-01-13 NOTE — Progress Notes (Signed)
@   Subjective:  Denies CP or dyspnea   Objective:  Filed Vitals:   01/12/12 0438 01/12/12 1338 01/12/12 2224 01/13/12 0442  BP: 157/91 133/87 146/97 152/93  Pulse: 71 82 71 70  Temp: 98.1 F (36.7 C) 97.8 F (36.6 C) 97.4 F (36.3 C) 98.1 F (36.7 C)  TempSrc: Oral Oral Oral Oral  Resp: 18 18 20 18   Height:      Weight: 308 lb 3.3 oz (139.8 kg)   305 lb 1.9 oz (138.4 kg)  SpO2: 97% 96% 99% 97%    Intake/Output from previous day:  Intake/Output Summary (Last 24 hours) at 01/13/12 0749 Last data filed at 01/13/12 0400  Gross per 24 hour  Intake    680 ml  Output   1800 ml  Net  -1120 ml    Physical Exam: Physical exam: Well-developed well-nourished in no acute distress.  Skin is warm and dry.  HEENT is normal.  Neck is supple. No thyromegaly.  Chest is clear to auscultation with normal expansion.  Cardiovascular exam is regular rate and rhythm.  Abdominal exam nontender or distended. No masses palpated. Extremities show no edema. neuro grossly intact    Lab Results: Basic Metabolic Panel:  Basename 01/13/12 0450 01/12/12 0443  NA 135 140  K 3.6 4.0  CL 98 101  CO2 28 29  GLUCOSE 124* 185*  BUN 32* 23  CREATININE 3.29* 3.16*  CALCIUM 9.4 9.6  MG -- --  PHOS -- --   CBC:  Basename 01/12/12 0443 01/10/12 1845 01/10/12 1438  WBC 7.2 10.5 --  NEUTROABS -- -- 8.1*  HGB 12.9* 11.8* --  HCT 40.7 36.9* --  MCV 76.6* 75.9* --  PLT 237 206 --   Cardiac Enzymes:  Basename 01/11/12 0752 01/11/12 0025 01/10/12 1627  CKTOTAL 190 209 223  CKMB 2.3 2.5 2.6  CKMBINDEX -- -- --  TROPONINI <0.30 <0.30 <0.30     Assessment/Plan:  1) Acute diastolic CHF - Symptoms resolved; continue present dose of lasix; patient can be dced from a cardiac standpoint and FU with Dr Angelena Form for cardiac issues; he will arrange outpatient functional study. LV function normal on echo but diastolic dysfunction and LVH noted. 2) Hypertension - BP elevated; add norvasc 5 mg daily  and titrate as outpatient. 3) Acute on chronic renal insuff - will need close fu with nephrology 4) Hyperlipidemia - would change zocor to pravachol at dc given potential interaction with norvasc. We will sign off; please call with questions.  Kirk Ruths 01/13/2012, 7:49 AM

## 2012-01-13 NOTE — Consult Note (Signed)
[name] 01/13/2012 [name] D Requesting Physician:  Dr. Blenda Nicely  Reason for Consult:  Renal failure, new onset? HPI: The patient is a 56 y.o. year-old male with hx of DM, HTN, gout and Etoh and tobacco use.  He was admitted Feb 25 with SOB and leg edema of recent onset. Creatinine was 2.6 on admit and has risen to 3.2 with diuresis.  The SOB has resolved, weight is down 5 kg since admit.    He denies knowledge of kidney disease.  His last MD visit before this was in 2010 at [facility] and his creat was 1.40 then.  UA now shows > 300 protein and is otherwise negative.  He was going to have knee surgery 3 yrs ago when they discovered his diabetes, rushed him to the hospital.  He's not sure how long he had been diabetic, wasn't seeing doctors prior to that.  He was working for [employer] but was laid off about a year ago, has no insurance, is working part-time and stopped both his BP and DM meds recently for financial reasons.  He drinks beer, up to 6pk per day, but can go days without drinking and has never had DT's.     Creatinine, Ser  Date/Time Value Range Status  01/13/2012  4:50 AM 3.29* 0.50-1.35 (mg/dL) Final  01/12/2012  4:43 AM 3.16* 0.50-1.35 (mg/dL) Final  01/11/2012  4:39 AM 2.73* 0.50-1.35 (mg/dL) Final  01/10/2012  6:45 PM 2.68* 0.50-1.35 (mg/dL) Final  01/10/2012  2:38 PM 2.62* 0.50-1.35 (mg/dL) Final    Past Medical History:  Past Medical History  Diagnosis Date  . Diabetes mellitus   . Hypertension   . Hyperlipidemia   . Gout   . Alcohol abuse   . Tobacco abuse     Past Surgical History:  Past Surgical History  Procedure Date  . Left knee arthroscopy with generalized joint synovectomy, 01/2006  . Ganglion cyst removal x 2     Family History:  Family History  Problem Relation Age of Onset  . Lung cancer Father   . Diabetes Mother   . Heart disease Mother    Social History:  reports that he has been smoking Cigarettes.  He has a 9.9 pack-year smoking history. He  has never used smokeless tobacco. He reports that he drinks about 21 ounces of alcohol per week. He reports that he does not use illicit drugs.  Allergies:  Allergies  Allergen Reactions  . Vioxx (Rofecoxib) Other (See Comments)    Gi upset.  Marland Kitchen Penicillins     Home medications: Prior to Admission medications   Medication Sig Start Date End Date Taking? Authorizing Provider  colchicine 0.6 MG tablet Take 0.6 mg by mouth daily.   Yes Historical Provider, MD  indomethacin (INDOCIN) 50 MG capsule Take 50 mg by mouth 3 (three) times daily.   Yes Historical Provider, MD  metFORMIN (GLUCOPHAGE) 1000 MG tablet Take 1,000 mg by mouth daily.   Yes Historical Provider, MD  predniSONE (DELTASONE) 20 MG tablet Take 60 mg by mouth daily.   Yes Historical Provider, MD    Inpatient medications:    . amLODipine  5 mg Oral Daily  . aspirin EC  81 mg Oral Daily  . bd getting started take home kit  1 kit Other Once  . carvedilol  6.25 mg Oral BID WC  . colchicine  0.6 mg Oral Daily  . enoxaparin  40 mg Subcutaneous Q24H  . folic acid  1 mg Oral Daily  .  furosemide  40 mg Oral Daily  . insulin aspart  0-15 Units Subcutaneous TID WC  . insulin aspart  0-5 Units Subcutaneous QHS  . insulin aspart protamine-insulin aspart  13 Units Subcutaneous BID WC  . living well with diabetes book   Does not apply Once  . potassium chloride  20 mEq Oral BID  . simvastatin  20 mg Oral q1800  . sodium chloride  3 mL Intravenous Q12H  . thiamine  100 mg Oral Daily  . DISCONTD: furosemide  40 mg Oral BID  . DISCONTD: insulin aspart protamine-insulin aspart  10 Units Subcutaneous BID WC    Review of Systems Gen:  Denies headache, fever, chills, sweats.  No weight loss. HEENT:  No visual change, sore throat, difficulty swallowing. Resp:  No difficulty breathing, DOE.  No cough or hemoptysis. Cardiac:  No chest pain, orthopnea, PND. + edema GI:   Denies abdominal pain.   No nausea, vomiting, diarrhea.  No  constipation. GU:  Denies difficulty or change in voiding.  No change in urine color.     MS:  Denies joint pain or swelling.   Derm:  Denies skin rash or itching.  No chronic skin conditions.  Neuro:   Denies focal weakness, memory problems, hx stroke or TIA.   Psych:  Denies symptoms of depression of anxiety.  No hallucination.    Physical Exam:  Blood pressure 143/94, pulse 71, temperature 97.8 F (36.6 C), temperature source Oral, resp. rate 22, height 6' (1.829 m), weight 138.4 kg (305 lb 1.9 oz), SpO2 96.00%.  Gen: alert, large framed obese BM, no distress, up in chair Skin: no rash, cyanosis Neck: no JVD, bruits or LAN Chest: clear bilat Heart: regular, no rub or gallop Abdomen: soft, obese nontender, no ascites, no masses Ext: 1+ ankle edema bilat Neuro: alert, Ox3, no focal deficit Heme/Lymph: no bruising or LAN  Labs: Basic Metabolic Panel:  Lab [id number] 0450 01/12/12 0443 01/11/12 0439 01/10/12 1845 01/10/12 1438  NA 135 140 135 -- 134*  K 3.6 4.0 3.8 -- 3.8  CL 98 101 100 -- 100  CO2 28 29 26  -- 25  GLUCOSE 124* 185* 178* -- 219*  BUN 32* 23 16 -- 15  CREATININE 3.29* 3.16* 2.73* 2.68* 2.62*  ALB -- -- -- -- --  CALCIUM 9.4 9.6 9.3 -- 8.9  PHOS -- -- -- -- --   Liver Function Tests:  Lab 01/10/12 1845  AST 22  ALT 39  ALKPHOS 89  BILITOT 0.7  PROT 7.1  ALBUMIN 2.6*   No results found for this basename: LIPASE:3,AMYLASE:3 in the last 168 hours No results found for this basename: AMMONIA:3 in the last 168 hours CBC:  Lab 01/12/12 0443 01/10/12 1845 01/10/12 1438  WBC 7.2 10.5 10.6*  NEUTROABS -- -- 8.1*  HGB 12.9* 11.8* 11.6*  HCT 40.7 36.9* 35.9*  MCV 76.6* 75.9* 75.4*  PLT 237 206 202   PT/INR: @labrcntip (inr:5) Cardiac Enzymes:  Lab 01/11/12 0752 01/11/12 0025 01/10/12 1627 01/10/12 1437  CKTOTAL 190 209 223 --  CKMB 2.3 2.5 2.6 --  CKMBINDEX -- -- -- --  TROPONINI <0.30 <0.30 <0.30 <0.30   CBG:  Lab 01/13/12 1127 01/13/12 0720  01/12/12 2058 01/12/12 1727 01/12/12 1218  GLUCAP 251* 148* 281* 152* 173*    Iron Studies: No results found for this basename: IRON:30,TIBC:30,TRANSFERRIN:30,FERRITIN:30 in the last 168 hours  Xrays/Other Studies: US Renal  01/11/2012  *RADIOLOGY REPORT*  Clinical Data: Acute renal failure  RENAL/URINARY  TRACT ULTRASOUND COMPLETE  Comparison:  None.  Findings:  Right Kidney:  Normal in size, measuring 13 cm in length, however there is mild thinning of the renal cortex with increased cortical echogenicity.  There is an approximately 1.3 x 1.1 x 1.5 cm anechoic cyst within the superior aspect of the right kidney.  No right-sided urinary obstruction.  No perinephric fluid.  Left Kidney:  The left kidney is normal in size, measuring 11.4 cm in length, however similar to the right, demonstrates mild thinning of the renal cortex with increased cortical echogenicity.  No discrete renal lesions.  No urinary obstruction.  No perinephric fluid.  Bladder:  Normal for degree of distension.  Bilateral ureteral jets are identified.  IMPRESSION: 1.  Findings suggestive of medical renal disease.  No urinary obstruction. 2.  Right-sided renal cyst.  Original Report Authenticated By: Rachel Moulds, M.D.    Assessment/Plan 1. Renal failure, mostl likely CKD due to DM and/or HTN. Stage IV, est GFR 23 ml/min- + proteinuria on UA, no hematuria, US shows echogenic kidneys with mild cortical thinning.  Creatinine was 1.40 in 2012, last lab we have from PCP's office.  Rise from 2.6 to 3.2 in hospital is appropriate for diuresis.  Wants to go home, can't afford hospital stay, will not order any other tests here. OK for discharge.  Have scheduled f/u with Dr. Moshe Cipro at [facility] on March 7th at [place] office will send appt info to him in the mail.  Would d/c on lasix 40 mg bid. 2. Diabetes mellitus 3. Hypertension 4. Tobacco and Etoh use 5. Obesity.  6. Pulm edema, EF normal- probably secondary to fluid volume overload  from CKD. Resolved.  Kelly Splinter  MD [employer] [phone] pgr    [phone] cell 01/13/2012, 3:29 PM

## 2012-01-13 NOTE — Progress Notes (Signed)
Subjective: Sitting up in chair eating breakfast. Denies CP, SOB.   Objective: Vital signs Filed Vitals:   01/12/12 1338 01/12/12 2224 01/13/12 0442 01/13/12 0800  BP: 133/87 146/97 152/93 151/94  Pulse: 82 71 70 69  Temp: 97.8 F (36.6 C) 97.4 F (36.3 C) 98.1 F (36.7 C) 97.8 F (36.6 C)  TempSrc: Oral Oral Oral Oral  Resp: 18 20 18 24   Height:      Weight:   138.4 kg (305 lb 1.9 oz)   SpO2: 96% 99% 97% 99%   Weight change: -1.4 kg (-3 lb 1.4 oz) Last BM Date: 01/12/12  Intake/Output from previous day: 02/27 0701 - 02/28 0700 In: 800 [P.O.:800] Out: 1800 [Urine:1800]     Physical Exam: General: Alert, awake, oriented x3, in no acute distress. HEENT: No bruits, no goiter. PERRL Mucus membranes moist/pink Heart: Regular rate and rhythm, without murmurs, rubs, gallops. No LEE Lungs: Normal effort. Breath sounds clear to auscultation bilaterally. No wheeze, rale Abdomen: Obese, Soft, nontender, nondistended, positive bowel sounds. Extremities: No clubbing cyanosis or edema with positive pedal pulses. Neuro: Grossly intact, nonfocal. Speech clear    Lab Results: Basic Metabolic Panel:  Basename 01/13/12 0450 01/12/12 0443  NA 135 140  K 3.6 4.0  CL 98 101  CO2 28 29  GLUCOSE 124* 185*  BUN 32* 23  CREATININE 3.29* 3.16*  CALCIUM 9.4 9.6  MG -- --  PHOS -- --   Liver Function Tests:  [facility] 01/10/12 1845  AST 22  ALT 39  ALKPHOS 89  BILITOT 0.7  PROT 7.1  ALBUMIN 2.6*   No results found for this basename: LIPASE:2,AMYLASE:2 in the last 72 hours No results found for this basename: AMMONIA:2 in the last 72 hours CBC:  Basename 01/12/12 0443 01/10/12 1845 01/10/12 1438  WBC 7.2 10.5 --  NEUTROABS -- -- 8.1*  HGB 12.9* 11.8* --  HCT 40.7 36.9* --  MCV 76.6* 75.9* --  PLT 237 206 --   Cardiac Enzymes:  Basename 01/11/12 0752 01/11/12 0025 01/10/12 1627  CKTOTAL 190 209 223  CKMB 2.3 2.5 2.6  CKMBINDEX -- -- --  TROPONINI <0.30 <0.30 <0.30     BNP:  Basename 01/10/12 1436  PROBNP 834.4*   D-Dimer: No results found for this basename: DDIMER:2 in the last 72 hours CBG:  Basename 01/13/12 0720 01/12/12 2058 01/12/12 1727 01/12/12 1218 01/12/12 0741 01/12/12 0010  GLUCAP 148* 281* 152* 173* 177* 163*   Hemoglobin A1C:  Basename 01/12/12 0443  HGBA1C 9.8*   Fasting Lipid Panel:  Basename 01/11/12 0439  CHOL 245*  HDL 49  LDLCALC 138*  TRIG 289*  CHOLHDL 5.0  LDLDIRECT --   Thyroid Function Tests:  Basename 01/10/12 1845  TSH 3.289  T4TOTAL --  FREET4 --  T3FREE --  THYROIDAB --   Anemia Panel: No results found for this basename: VITAMINB12,FOLATE,FERRITIN,TIBC,IRON,RETICCTPCT in the last 72 hours Coagulation: No results found for this basename: LABPROT:2,INR:2 in the last 72 hours Urine Drug Screen: Drugs of Abuse  No results found for this basename: labopia, cocainscrnur, labbenz, amphetmu, thcu, labbarb    Alcohol Level: No results found for this basename: ETH:2 in the last 72 hours Urinalysis:  Basename 01/10/12 1404  COLORURINE YELLOW  LABSPEC 1.023  PHURINE 6.0  GLUCOSEU >1000*  HGBUR TRACE*  BILIRUBINUR NEGATIVE  KETONESUR NEGATIVE  PROTEINUR >300*  UROBILINOGEN 1.0  NITRITE NEGATIVE  LEUKOCYTESUR NEGATIVE   Misc. Labs:  No results found for this or any previous visit (  from the past 240 hour(s)).  Studies/Results: US Renal  01/11/2012  *RADIOLOGY REPORT*  Clinical Data: Acute renal failure  RENAL/URINARY TRACT ULTRASOUND COMPLETE  Comparison:  None.  Findings:  Right Kidney:  Normal in size, measuring 13 cm in length, however there is mild thinning of the renal cortex with increased cortical echogenicity.  There is an approximately 1.3 x 1.1 x 1.5 cm anechoic cyst within the superior aspect of the right kidney.  No right-sided urinary obstruction.  No perinephric fluid.  Left Kidney:  The left kidney is normal in size, measuring 11.4 cm in length, however similar to the right,  demonstrates mild thinning of the renal cortex with increased cortical echogenicity.  No discrete renal lesions.  No urinary obstruction.  No perinephric fluid.  Bladder:  Normal for degree of distension.  Bilateral ureteral jets are identified.  IMPRESSION: 1.  Findings suggestive of medical renal disease.  No urinary obstruction. 2.  Right-sided renal cyst.  Original Report Authenticated By: Rachel Moulds, M.D.    Medications: Scheduled Meds:   . amLODipine  5 mg Oral Daily  . aspirin EC  81 mg Oral Daily  . bd getting started take home kit  1 kit Other Once  . carvedilol  6.25 mg Oral BID WC  . colchicine  0.6 mg Oral Daily  . enoxaparin  40 mg Subcutaneous Q24H  . folic acid  1 mg Oral Daily  . furosemide  40 mg Oral Daily  . insulin aspart  0-15 Units Subcutaneous TID WC  . insulin aspart  0-5 Units Subcutaneous QHS  . insulin aspart protamine-insulin aspart  13 Units Subcutaneous BID WC  . living well with diabetes book   Does not apply Once  . potassium chloride  20 mEq Oral BID  . simvastatin  20 mg Oral q1800  . sodium chloride  3 mL Intravenous Q12H  . thiamine  100 mg Oral Daily  . DISCONTD: carvedilol  3.125 mg Oral BID WC  . DISCONTD: furosemide  40 mg Intravenous Q12H  . DISCONTD: furosemide  40 mg Oral BID  . DISCONTD: insulin aspart protamine-insulin aspart  10 Units Subcutaneous BID WC   Continuous Infusions:  PRN Meds:.sodium chloride, acetaminophen, LORazepam, ondansetron (ZOFRAN) IV, sodium chloride, zolpidem  Assessment/Plan:  Principal Problem:  *CHF (congestive heart failure) Active Problems:  Hypertension  Hyperlipidemia  Gout  Alcohol abuse  Tobacco abuse  Obesity  Chronic kidney disease  Diabetes mellitus  Acute diastolic CHF (congestive heart failure)  Compensated.  2decho Yields EF 60-65% with grade 1 diastolic dysfunction. CE neg to date. Denies CP. Appreciate cardiology input.  Will continue lasix at lower dose. strict I&O's daily weights.  Today's weight 138.4< 139.8kg <139 . Wt 143.6 at admission .  OP functional study per cards Active Problems:  Hypertension  Improved but remains Uncontrolled. (history of medical non-compliance due to finances). Will continue Lasix and  Coreg. Will hold off on ACE-I or ARB given current impairment in renal function. SBP range 123-162. DBP range 92-85. Norvasc added per cards.  Hyperlipidemia  Zocor started 2/27. Will change to pravachol at discharge per cardiology due to potential interaction with norvasc Gout  Continue daily colchicine. Hold NSAIDS due to renal impairment.  Alcohol abuse  No signs of  alcohol withdrawal. Will provide thiamine and folic acid supplementation.  Tobacco abuse  Tobacco cessation counseling.  Obesity  Will ask the dietician to evaluate. Will weigh to get BMI.  Chronic kidney disease  Baseline renal  function not known. Spoke with PCP  who reports most recent lab work 2010 and creatinine 1.3 at that time. Creatinine 2.6 on admission. Renal US neg for obstruction. ? Component of acute versus CKD from uncontrolled HTN and/or DM. Creatinine increased likely related to lasix as well as chronic component. Lasix decrease and creatinine continues to increase. Renal consult requested.   Diabetes  CBG 163, 177. Hemoglobin A1c 9.8 Continue 70/30 and SSI.  Will require insulin at discharge. 70/30 increased to 13 units BID CBG range 148-130. Will change diet to carb modified and consider increasing insulin to 15 units BID per diabetes coordinator.      LOS: 3 days   [facility] M 01/13/2012, 8:30 AM

## 2012-01-13 NOTE — Discharge Instructions (Addendum)
Diabetes and Small Vessel Disease Small vessel disease (microvascular disease) includes nephropathy, retinopathy, and neuropathy. People with diabetes are at risk for these problems, but keeping blood glucose (sugar) controlled is helpful in preventing problems. DIABETIC KIDNEY PROBLEMS (DIABETIC NEPHROPATHY)  Diabetic nephropathy occurs in many patients with diabetes.   Damage to the small vessels in the kidneys is the leading cause of end-stage renal disease (ESRD).   Protein in the urine (albuminuria) in the range of 30 to 300 mg/24 h (microalbuminuria) is a sign of the earliest stage of diabetic nephropathy.   Good blood glucose (sugar) and blood pressure control significantly reduce the progression of nephropathy.  DIABETIC EYE PROBLEMS (DIABETIC RETINOPATHY)  Diabetic retinopathy is the most common cause of new cases of blindness in adults. It is related to the number of years you have had diabetes.   Common risk factors include high blood sugar (hyperglycemia), high blood pressure (hypertension), and poorly controlled blood lipids such as high blood cholesterol (hypercholesterolemia).  DIABETIC NERVE PROBLEMS (DIABETIC NEUROPATHY) Diabetic neuropathy is the most common, long-term complication of diabetes. It is responsible for more than half of leg amputations not due to accidents. The main risk for developing diabetic neuropathy seems to be uncontrolled blood sugars. Hyperglycemia damages the nerve fibers causing sensation (feeling) problems. The closer you can keep the following guidelines, the better chance you will have avoiding problems from small vessel disease.  Working toward near normal blood glucose or as normal as possible. You will need to keep your blood glucose and A1c at the target range prescribed by your caregiver.   Keep your blood pressure less than 130/80.   Keep your low-density lipoprotein (LDL) cholesterol (one of the fats in your blood) at less than 100 mg/dL.  An LDL less than 70 mg/dL may be recommended for high risk patients.  You cannot change your family history, but it is important to change the risk factors that you can. Risk factors you can control include:  Controlling high blood pressure.   Stopping smoking.   Using alcohol only in moderation. Generally, this means about one drink per day for women and two drinks per day for men.   Controlling your blood lipids (cholesterol and triglycerides).   Treating heart problems, if these are contributing to risk.  SEEK MEDICAL CARE IF:   You are having problems keeping your blood glucose in goal range.   You notice a change in your vision or new problems with your vision.   You have wound or sore that does not heal.   Your blood pressure is above the target range.  Document Released: 11/04/2003 Document Revised: 07/14/2011 Document Reviewed: 04/11/2009 [facility] Patient Information 2012 [place].Diabetes and Small Vessel Disease Small vessel disease (microvascular disease) includes nephropathy, retinopathy, and neuropathy. People with diabetes are at risk for these problems, but keeping blood glucose (sugar) controlled is helpful in preventing problems. DIABETIC KIDNEY PROBLEMS (DIABETIC NEPHROPATHY)  Diabetic nephropathy occurs in many patients with diabetes.   Damage to the small vessels in the kidneys is the leading cause of end-stage renal disease (ESRD).   Protein in the urine (albuminuria) in the range of 30 to 300 mg/24 h (microalbuminuria) is a sign of the earliest stage of diabetic nephropathy.   Good blood glucose (sugar) and blood pressure control significantly reduce the progression of nephropathy.  DIABETIC EYE PROBLEMS (DIABETIC RETINOPATHY)  Diabetic retinopathy is the most common cause of new cases of blindness in adults. It is related to the number of  years you have had diabetes.   Common risk factors include high blood sugar (hyperglycemia), high blood pressure  (hypertension), and poorly controlled blood lipids such as high blood cholesterol (hypercholesterolemia).  DIABETIC NERVE PROBLEMS (DIABETIC NEUROPATHY) Diabetic neuropathy is the most common, long-term complication of diabetes. It is responsible for more than half of leg amputations not due to accidents. The main risk for developing diabetic neuropathy seems to be uncontrolled blood sugars. Hyperglycemia damages the nerve fibers causing sensation (feeling) problems. The closer you can keep the following guidelines, the better chance you will have avoiding problems from small vessel disease.  Working toward near normal blood glucose or as normal as possible. You will need to keep your blood glucose and A1c at the target range prescribed by your caregiver.   Keep your blood pressure less than 130/80.   Keep your low-density lipoprotein (LDL) cholesterol (one of the fats in your blood) at less than 100 mg/dL. An LDL less than 70 mg/dL may be recommended for high risk patients.  You cannot change your family history, but it is important to change the risk factors that you can. Risk factors you can control include:  Controlling high blood pressure.   Stopping smoking.   Using alcohol only in moderation. Generally, this means about one drink per day for women and two drinks per day for men.   Controlling your blood lipids (cholesterol and triglycerides).   Treating heart problems, if these are contributing to risk.  SEEK MEDICAL CARE IF:   You are having problems keeping your blood glucose in goal range.   You notice a change in your vision or new problems with your vision.   You have wound or sore that does not heal.   Your blood pressure is above the target range.  Document Released: 11/04/2003 Document Revised: 07/14/2011 Document Reviewed: 04/11/2009 [facility] Patient Information 2012 [place].Heart Failure Heart failure (HF) means your heart has trouble pumping blood. The blood  is not circulated very well in your body because your heart is weak. HF may cause blood to back up into your lungs. This is commonly called "fluid in the lungs." HF may also cause your ankles and legs to puff up (swell). It is important to take good care of yourself when you have HF. [place]  Take your medicine as told by your doctor.   Do not stop taking your medicine unless told to by your doctor.   Be sure to get your medicine refilled before it runs out.   Do not skip any doses of medicine.   Tell your doctor if you cannot afford your medicine.   Keep a list of all the medicine you take. This should include the name, how much you take, and when you take it.   Ask your doctor if you have any questions about your medicine. Do not take over-the-counter medicine unless your doctor says it is okay.  What you eat  Do not drink alcohol unless your doctor says it is okay.   Avoid food that is high in fat. Avoid foods fried in oil or made with fat.   Eat a healthy diet. A dietitian can help you with healthy food choices.   Limit how much salt you eat. Do not eat more than 1500 milligrams (mg) of salt (sodium) a day.   Do not add salt to your food.   Do not eat food made with a lot of salt. Here are some examples:   Canned  vegetables.   Canned soups.   Canned drinks.   Hot dogs.   Fast food.   Pizza.   Chips.  Check your weight  Weigh yourself every morning. You should do this after you pee (urinate) and before you eat breakfast.   Wear the same amount of clothes each time you weigh yourself.   Write down your weight every day. Tell your doctor if you gain 3 lb/1.4 kg or more in 1 day or 5 lb/2.3 kg in a week.  Blood pressure monitoring  Buy a home blood pressure cuff.   Check your blood pressure as told by your doctor. Write down your blood pressure numbers on a sheet of paper.   Bring your blood pressure numbers to your doctor visits.   Smoking  Smoking is bad for your heart.   Ask your doctor how to stop smoking.  Exercise  Talk to your doctor about exercise.   Ask how much exercise is right for you.   Exercise as much as you can. Stop if you feel tired, have problems breathing, or have chest pain.  Keep all your doctor appointments. GET HELP RIGHT AWAY IF:   You have trouble breathing.   You have a cough that does not go away.   You cannot sleep because you have trouble breathing.   You gain 3 lb/1.4 kg or more in 1 day or 5 lb/2.3 kg in a week.   You have puffy ankles or legs.   You have an enlarged (bloated) belly (abdomen).   You pass out (faint).   You have really bad chest pain or pressure. This includes pain or pressure in your:   Arms.   Jaw.   Neck.   Back.  If you have any of the above problems, call your local emergency services (911 in [place] Do not drive yourself to the hospital. MAKE SURE YOU:   Understand these instructions.   Will watch your condition.   Will get help right away if you are not doing well or get worse.  Document Released: 08/10/2008 Document Revised: 07/14/2011 Document Reviewed: 03/04/2009 [facility] Patient Information 2012 [place].Hypertension Hypertension is another name for high blood pressure. High blood pressure may mean that your heart needs to work harder to pump blood. Blood pressure consists of two numbers, which includes a higher number over a lower number (example: 110/72). HOME CARE   Make lifestyle changes as told by your doctor. This may include weight loss and exercise.   Take your blood pressure medicine every day.   Limit how much salt you use.   Stop smoking if you smoke.   Do not use drugs.   Talk to your doctor if you are using decongestants or birth control pills. These medicines might make blood pressure higher.   Females should not drink more than 1 alcoholic drink per day. Males should not drink more than 2 alcoholic  drinks per day.   See your doctor as told.  GET HELP RIGHT AWAY IF:   You have a blood pressure reading with a top number of 180 or higher.   You get a very bad headache.   You get blurred or changing vision.   You feel confused.   You feel weak, numb, or faint.   You get chest or belly (abdominal) pain.   You throw up (vomit).   You cannot breathe very well.  MAKE SURE YOU:   Understand these instructions.   Will watch your condition.  Will get help right away if you are not doing well or get worse.  Document Released: 04/19/2008 Document Revised: 07/14/2011 Document Reviewed: 04/19/2008 [facility] Patient Information 2012 [place].Kidney Failure Kidney failure happens when the kidneys cannot remove waste and excess fluid that naturally builds up in your blood after your body breaks down food. This leads to a dangerous buildup of waste products and fluid in the blood. HOME CARE  Follow your diet as told by your doctor.   Take all medicines as told by your doctor.   Keep all of your dialysis appointments. Call if you are unable to keep an appointment.  GET HELP RIGHT AWAY IF:   You make a lot more or very little pee (urine).   Your face or ankles puff up (swell).   You develop shortness of breath.   You develop weakness, feel tired, or you do not feel hungry (appetite loss).   You feel poorly for no known reason.  MAKE SURE YOU:   Understand these instructions.   Will watch your condition.   Will get help right away if you are not doing well or get worse.  Document Released: 01/26/2010 Document Revised: 07/14/2011 Document Reviewed: 03/04/2010 [facility] Patient Information 2012 [place].Smoking Cessation This document explains the best ways for you to quit smoking and new treatments to help. It lists new medicines that can double or triple your chances of quitting and quitting for good. It also considers ways to avoid relapses and concerns you may  have about quitting, including weight gain. NICOTINE: A POWERFUL ADDICTION If you have tried to quit smoking, you know how hard it can be. It is hard because nicotine is a very addictive drug. For some people, it can be as addictive as heroin or cocaine. Usually, people make 2 or 3 tries, or more, before finally being able to quit. Each time you try to quit, you can learn about what helps and what hurts. Quitting takes hard work and a lot of effort, but you can quit smoking. QUITTING SMOKING IS ONE OF THE MOST IMPORTANT THINGS YOU WILL EVER DO.  You will live longer, feel better, and live better.   The impact on your body of quitting smoking is felt almost immediately:   Within 20 minutes, blood pressure decreases. Pulse returns to its normal level.   After 8 hours, carbon monoxide levels in the blood return to normal. Oxygen level increases.   After 24 hours, chance of heart attack starts to decrease. Breath, hair, and body stop smelling like smoke.   After 48 hours, damaged nerve endings begin to recover. Sense of taste and smell improve.   After 72 hours, the body is virtually free of nicotine. Bronchial tubes relax and breathing becomes easier.   After 2 to 12 weeks, lungs can hold more air. Exercise becomes easier and circulation improves.   Quitting will reduce your risk of having a heart attack, stroke, cancer, or lung disease:   After 1 year, the risk of coronary heart disease is cut in half.   After 5 years, the risk of stroke falls to the same as a nonsmoker.   After 10 years, the risk of lung cancer is cut in half and the risk of other cancers decreases significantly.   After 15 years, the risk of coronary heart disease drops, usually to the level of a nonsmoker.   If you are pregnant, quitting smoking will improve your chances of having a healthy baby.   The  people you live with, especially your children, will be healthier.   You will have extra money to spend on things  other than cigarettes.  FIVE KEYS TO QUITTING Studies have shown that these 5 steps will help you quit smoking and quit for good. You have the best chances of quitting if you use them together: 1. Get ready.  2. Get support and encouragement.  3. Learn new skills and behaviors.  4. Get medicine to reduce your nicotine addiction and use it correctly.  5. Be prepared for relapse or difficult situations. Be determined to continue trying to quit, even if you do not succeed at first.  1. GET READY  Set a quit date.   Change your environment.   Get rid of ALL cigarettes, ashtrays, matches, and lighters in your home, car, and place of work.   Do not let people smoke in your home.   Review your past attempts to quit. Think about what worked and what did not.   Once you quit, do not smoke. NOT EVEN A PUFF!  2. GET SUPPORT AND ENCOURAGEMENT Studies have shown that you have a better chance of being successful if you have help. You can get support in many ways.  Tell your family, friends, and coworkers that you are going to quit and need their support. Ask them not to smoke around you.   Talk to your caregivers (doctor, dentist, nurse, pharmacist, psychologist, and/or smoking counselor).   Get individual, group, or telephone counseling and support. The more counseling you have, the better your chances are of quitting. Programs are available at [employer] and health centers. Call your local health department for information about programs in your area.   Spiritual beliefs and practices may help some smokers quit.   Quit meters are Insurance underwriter that keep track of quit statistics, such as amount of "quit-time," cigarettes not smoked, and money saved.   Many smokers find one or more of the many self-help books available useful in helping them quit and stay off tobacco.  3. LEARN NEW SKILLS AND BEHAVIORS  Try to distract yourself from urges to smoke. Talk to  someone, go for a walk, or occupy your time with a task.   When you first try to quit, change your routine. Take a different route to work. Drink tea instead of coffee. Eat breakfast in a different place.   Do something to reduce your stress. Take a hot bath, exercise, or read a book.   Plan something enjoyable to do every day. Reward yourself for not smoking.   Explore interactive web-based programs that specialize in helping you quit.  4. GET MEDICINE AND USE IT CORRECTLY Medicines can help you stop smoking and decrease the urge to smoke. Combining medicine with the above behavioral methods and support can quadruple your chances of successfully quitting smoking. The [place]. Food and Drug Administration (FDA) has approved 7 medicines to help you quit smoking. These medicines fall into 3 categories.  Nicotine replacement therapy (delivers nicotine to your body without the negative effects and risks of smoking):   Nicotine gum: Available over-the-counter.   Nicotine lozenges: Available over-the-counter.   Nicotine inhaler: Available by prescription.   Nicotine nasal spray: Available by prescription.   Nicotine skin patches (transdermal): Available by prescription and over-the-counter.   Antidepressant medicine (helps people abstain from smoking, but how this works is unknown):   Bupropion sustained-release (SR) tablets: Available by prescription.   Nicotinic receptor partial agonist (  simulates the effect of nicotine in your brain):   Varenicline tartrate tablets: Available by prescription.   Ask your caregiver for advice about which medicines to use and how to use them. Carefully read the information on the package.   Everyone who is trying to quit may benefit from using a medicine. If you are pregnant or trying to become pregnant, nursing an infant, you are under age 82, or you smoke fewer than 10 cigarettes per day, talk to your caregiver before taking any nicotine replacement  medicines.   You should stop using a nicotine replacement product and call your caregiver if you experience nausea, dizziness, weakness, vomiting, fast or irregular heartbeat, mouth problems with the lozenge or gum, or redness or swelling of the skin around the patch that does not go away.   Do not use any other product containing nicotine while using a nicotine replacement product.   Talk to your caregiver before using these products if you have diabetes, heart disease, asthma, stomach ulcers, you had a recent heart attack, you have high blood pressure that is not controlled with medicine, a history of irregular heartbeat, or you have been prescribed medicine to help you quit smoking.  5. BE PREPARED FOR RELAPSE OR DIFFICULT SITUATIONS  Most relapses occur within the first 3 months after quitting. Do not be discouraged if you start smoking again. Remember, most people try several times before they finally quit.   You may have symptoms of withdrawal because your body is used to nicotine. You may crave cigarettes, be irritable, feel very hungry, cough often, get headaches, or have difficulty concentrating.   The withdrawal symptoms are only temporary. They are strongest when you first quit, but they will go away within 10 to 14 days.  Here are some difficult situations to watch for:  Alcohol. Avoid drinking alcohol. Drinking lowers your chances of successfully quitting.   Caffeine. Try to reduce the amount of caffeine you consume. It also lowers your chances of successfully quitting.   Other smokers. Being around smoking can make you want to smoke. Avoid smokers.   Weight gain. Many smokers will gain weight when they quit, usually less than 10 pounds. Eat a healthy diet and stay active. Do not let weight gain distract you from your main goal, quitting smoking. Some medicines that help you quit smoking may also help delay weight gain. You can always lose the weight gained after you quit.   Bad  mood or depression. There are a lot of ways to improve your mood other than smoking.  If you are having problems with any of these situations, talk to your caregiver. SPECIAL SITUATIONS AND CONDITIONS Studies suggest that everyone can quit smoking. Your situation or condition can give you a special reason to quit.  Pregnant women/new mothers: By quitting, you protect your baby's health and your own.   Hospitalized patients: By quitting, you reduce health problems and help healing.   Heart attack patients: By quitting, you reduce your risk of a second heart attack.   Lung, head, and neck cancer patients: By quitting, you reduce your chance of a second cancer.   Parents of children and adolescents: By quitting, you protect your children from illnesses caused by secondhand smoke.  QUESTIONS TO THINK ABOUT Think about the following questions before you try to stop smoking. You may want to talk about your answers with your caregiver.  Why do you want to quit?   If you tried to quit in the past,  what helped and what did not?   What will be the most difficult situations for you after you quit? How will you plan to handle them?   Who can help you through the tough times? Your family? Friends? Caregiver?   What pleasures do you get from smoking? What ways can you still get pleasure if you quit?  Here are some questions to ask your caregiver:  How can you help me to be successful at quitting?   What medicine do you think would be best for me and how should I take it?   What should I do if I need more help?   What is smoking withdrawal like? How can I get information on withdrawal?  Quitting takes hard work and a lot of effort, but you can quit smoking. FOR MORE INFORMATION  Smokefree.gov (Inrails.tn) provides free, accurate, evidence-based information and professional assistance to help support the immediate and long-term needs of people trying to quit smoking. Document  Released: 10/26/2001 Document Revised: 07/14/2011 Document Reviewed: 08/18/2009 [facility] Patient Information 2012 [place].

## 2012-01-13 NOTE — Discharge Summary (Signed)
Physician Discharge Summary  Patient ID: [name] MRN: [id number] DOB/AGE: 55-Jul-1957 55 y.o.  Admit date: 01/10/2012 Discharge date: 01/13/2012  Primary Care Physician:  Gara Kroner, MD, MD   Discharge Diagnoses:    Active Problems:  Hypertension  Hyperlipidemia  Gout  Alcohol abuse  Tobacco abuse  Obesity  Chronic kidney disease  Diabetes mellitus   Medication List  As of 01/13/2012  4:30 PM   STOP taking these medications         indomethacin 50 MG capsule      metFORMIN 1000 MG tablet      predniSONE 20 MG tablet         TAKE these medications         amLODipine 5 MG tablet   Commonly known as: NORVASC   Take 1 tablet (5 mg total) by mouth daily.      aspirin 81 MG EC tablet   Take 1 tablet (81 mg total) by mouth daily.      carvedilol 6.25 MG tablet   Commonly known as: COREG   Take 1 tablet (6.25 mg total) by mouth 2 (two) times daily with a meal.      colchicine 0.6 MG tablet   Take 0.6 mg by mouth daily.      folic acid 1 MG tablet   Commonly known as: FOLVITE   Take 1 tablet (1 mg total) by mouth daily.      furosemide 40 MG tablet   Commonly known as: LASIX   Take 1 tablet (40 mg total) by mouth 2 (two) times daily.      insulin aspart 100 UNIT/ML injection   Commonly known as: novoLOG   Inject 0-15 Units into the skin 3 (three) times daily with meals.      insulin aspart 100 UNIT/ML injection   Commonly known as: novoLOG   Inject 0-5 Units into the skin at bedtime.      insulin aspart protamine-insulin aspart (70-30) 100 UNIT/ML injection   Commonly known as: NOVOLOG 70/30   Inject 15 Units into the skin 2 (two) times daily with a meal. Inject 15 Units into the skin 2 (two) times daily with a meal. 70/30 Insulin should be given with a meal. NOTE: PRODUCT IS Novolog MIX 70/30 --- NOT FLOORSTOCK      potassium chloride SA 20 MEQ tablet   Commonly known as: K-DUR,KLOR-CON   Take 1 tablet (20 mEq total) by mouth 2 (two) times daily.       pravastatin 40 MG tablet   Commonly known as: PRAVACHOL   Take 1 tablet (40 mg total) by mouth daily.      thiamine 100 MG tablet   Take 1 tablet (100 mg total) by mouth daily.             Disposition and Follow-up: Pt is medically stable and ready for discharge to home. Has appointment with renal 01/20/12. Has appointment with cards 02/14/12. Will call to make appointment with PCP 1 week  Consults:  cardiology and nephrology  Physical Exam: General: Alert, awake, oriented x3, in no acute distress.  HEENT: No bruits, no goiter. PERRL Mucus membranes moist/pink  Heart: Regular rate and rhythm, without murmurs, rubs, gallops. No LEE  Lungs: Normal effort. Breath sounds clear to auscultation bilaterally. No wheeze, rale  Abdomen: Obese, Soft, nontender, nondistended, positive bowel sounds.  Extremities: No clubbing cyanosis or edema with positive pedal pulses.  Neuro: Grossly intact, nonfocal. Speech clear  Significant Diagnostic Studies:  Dg Chest Portable 1 View  01/10/2012  *RADIOLOGY REPORT*  Clinical Data: Shortness of breath since Saturday.  Coughing. History of hypertension.  History of diabetes.  Smoking.  PORTABLE CHEST - 1 VIEW  Comparison: 04/02/2008  Findings: The heart is enlarged.  There is central pulmonary edema. No focal consolidations or pleural effusions.  Visualized osseous structures have a normal appearance.  IMPRESSION: Cardiomegaly and pulmonary edema.  Original Report Authenticated By: Glenice Bow, M.D.    Labs Reviewed  URINALYSIS, ROUTINE W REFLEX MICROSCOPIC - Abnormal; Notable for the following:    Glucose, UA >1000 (*)    Hgb urine dipstick TRACE (*)    Protein, ur >300 (*)    All other components within normal limits  CBC - Abnormal; Notable for the following:    WBC 10.6 (*)    Hemoglobin 11.6 (*)    HCT 35.9 (*)    MCV 75.4 (*)    MCH 24.4 (*)    All other components within normal limits  DIFFERENTIAL - Abnormal; Notable for the  following:    Neutro Abs 8.1 (*)    All other components within normal limits  BASIC METABOLIC PANEL - Abnormal; Notable for the following:    Sodium 134 (*)    Glucose, Bld 219 (*)    Creatinine, Ser 2.62 (*)    GFR calc non Af Amer 26 (*)    GFR calc Af Amer 30 (*)    All other components within normal limits  PRO B NATRIURETIC PEPTIDE - Abnormal; Notable for the following:    Pro B Natriuretic peptide (BNP) 834.4 (*)    All other components within normal limits  GLUCOSE, CAPILLARY - Abnormal; Notable for the following:    Glucose-Capillary 217 (*)    All other components within normal limits  URINE MICROSCOPIC-ADD ON - Abnormal; Notable for the following:    Squamous Epithelial / LPF FEW (*)    Bacteria, UA FEW (*)    Casts HYALINE CASTS (*) GRANULAR CAST   All other components within normal limits  GLUCOSE, CAPILLARY - Abnormal; Notable for the following:    Glucose-Capillary 186 (*)    All other components within normal limits  BASIC METABOLIC PANEL - Abnormal; Notable for the following:    Glucose, Bld 178 (*)    Creatinine, Ser 2.73 (*)    GFR calc non Af Amer 25 (*)    GFR calc Af Amer 29 (*)    All other components within normal limits  CBC - Abnormal; Notable for the following:    Hemoglobin 11.8 (*)    HCT 36.9 (*)    MCV 75.9 (*)    MCH 24.3 (*)    All other components within normal limits  CREATININE, SERUM - Abnormal; Notable for the following:    Creatinine, Ser 2.68 (*)    GFR calc non Af Amer 25 (*)    GFR calc Af Amer 29 (*)    All other components within normal limits  LIPID PANEL - Abnormal; Notable for the following:    Cholesterol 245 (*)    Triglycerides 289 (*)    VLDL 58 (*)    LDL Cholesterol 138 (*)    All other components within normal limits  HEPATIC FUNCTION PANEL - Abnormal; Notable for the following:    Albumin 2.6 (*)    All other components within normal limits  GLUCOSE, CAPILLARY - Abnormal; Notable for the following:  Glucose-Capillary 258 (*)    All other components within normal limits  GLUCOSE, CAPILLARY - Abnormal; Notable for the following:    Glucose-Capillary 196 (*)    All other components within normal limits  GLUCOSE, CAPILLARY - Abnormal; Notable for the following:    Glucose-Capillary 223 (*)    All other components within normal limits  BASIC METABOLIC PANEL - Abnormal; Notable for the following:    Glucose, Bld 185 (*)    Creatinine, Ser 3.16 (*)    GFR calc non Af Amer 21 (*)    GFR calc Af Amer 24 (*)    All other components within normal limits  CBC - Abnormal; Notable for the following:    Hemoglobin 12.9 (*)    MCV 76.6 (*)    MCH 24.3 (*)    All other components within normal limits  GLUCOSE, CAPILLARY - Abnormal; Notable for the following:    Glucose-Capillary 154 (*)    All other components within normal limits  GLUCOSE, CAPILLARY - Abnormal; Notable for the following:    Glucose-Capillary 245 (*)    All other components within normal limits  GLUCOSE, CAPILLARY - Abnormal; Notable for the following:    Glucose-Capillary 163 (*)    All other components within normal limits  GLUCOSE, CAPILLARY - Abnormal; Notable for the following:    Glucose-Capillary 177 (*)    All other components within normal limits  HEMOGLOBIN A1C - Abnormal; Notable for the following:    Hemoglobin A1C 9.8 (*)    Mean Plasma Glucose 235 (*)    All other components within normal limits  GLUCOSE, CAPILLARY - Abnormal; Notable for the following:    Glucose-Capillary 173 (*)    All other components within normal limits  BASIC METABOLIC PANEL - Abnormal; Notable for the following:    Glucose, Bld 124 (*)    BUN 32 (*)    Creatinine, Ser 3.29 (*)    GFR calc non Af Amer 20 (*)    GFR calc Af Amer 23 (*)    All other components within normal limits  GLUCOSE, CAPILLARY - Abnormal; Notable for the following:    Glucose-Capillary 152 (*)    All other components within normal limits  GLUCOSE,  CAPILLARY - Abnormal; Notable for the following:    Glucose-Capillary 281 (*)    All other components within normal limits  GLUCOSE, CAPILLARY - Abnormal; Notable for the following:    Glucose-Capillary 148 (*)    All other components within normal limits  GLUCOSE, CAPILLARY - Abnormal; Notable for the following:    Glucose-Capillary 251 (*)    All other components within normal limits  TROPONIN I  CARDIAC PANEL(CRET KIN+CKTOT+MB+TROPI)  CARDIAC PANEL(CRET KIN+CKTOT+MB+TROPI)  TSH  CARDIAC PANEL(CRET KIN+CKTOT+MB+TROPI)  HEMOGLOBIN A1C        US Renal  01/11/2012  *RADIOLOGY REPORT*  Clinical Data: Acute renal failure  RENAL/URINARY TRACT ULTRASOUND COMPLETE  Comparison:  None.  Findings:  Right Kidney:  Normal in size, measuring 13 cm in length, however there is mild thinning of the renal cortex with increased cortical echogenicity.  There is an approximately 1.3 x 1.1 x 1.5 cm anechoic cyst within the superior aspect of the right kidney.  No right-sided urinary obstruction.  No perinephric fluid.  Left Kidney:  The left kidney is normal in size, measuring 11.4 cm in length, however similar to the right, demonstrates mild thinning of the renal cortex with increased cortical echogenicity.  No discrete renal lesions.  No  urinary obstruction.  No perinephric fluid.  Bladder:  Normal for degree of distension.  Bilateral ureteral jets are identified.  IMPRESSION: 1.  Findings suggestive of medical renal disease.  No urinary obstruction. 2.  Right-sided renal cyst.  Original Report Authenticated By: Rachel Moulds, M.D.   Dg Chest Portable 1 View  01/10/2012  *RADIOLOGY REPORT*  Clinical Data: Shortness of breath since Saturday.  Coughing. History of hypertension.  History of diabetes.  Smoking.  PORTABLE CHEST - 1 VIEW  Comparison: 04/02/2008  Findings: The heart is enlarged.  There is central pulmonary edema. No focal consolidations or pleural effusions.  Visualized osseous structures have a  normal appearance.  IMPRESSION: Cardiomegaly and pulmonary edema.  Original Report Authenticated By: Glenice Bow, M.D.       Brief H and P: For complete details please refer to admission H and P, but in brief  [name] is an 56 y.o. male with PMH of HTN, DM, and alcohol abuse who presented to the hospital on 01/10/12 with a 2 day history of the gradual onset dyspnea. He noticed it for the first time when he was at work exerting himself by "running". No associated fever, chills, but does endorse an occasional cough which is non-productive. No associated chest pain or tightness. He is presented short of breath with mild exertion. He has also noticed the recent onset of lower extremity edema. Upon initial evaluation by the ED physician, he was noted to be non-compliant with his anti-hypertensive medication, and a CXR showed pulmonary edema. He was referred to the hospitalist service for evaluation of his CHF.    Hospital Course:   Active Problems:  Hypertension  Hyperlipidemia  Gout  Alcohol abuse  Tobacco abuse  Obesity  Chronic kidney disease  Diabetes mellitus Acute diastolic CHF (congestive heart failure)  New diagnosis. Pt admitted to tele. Chest xray as above, BNP 832 at discharge. Pt. Provided with IV lasix and responded positively. At time of discharge compensated. Pt had a 2decho yielding EF 60-65% with grade 1 diastolic dysfunction. CE neg.  Pt followed by cardiology. Cardiology will follow OP to rule out CAD. At discharge will continue lasix at 40mg  BID (per renal).Pt started on coreg as well.  Today's weight 138.4< 139.8kg <139 . Wt 143.6 at admission .  Active Problems:  Hypertension Pt unable to get meds since lost job therefore noncompliant. At admission lasix and coreg started with poor control. Cardiology added norvasc. At time of discharge SBP range 146-152 DBP range 90-96.  Will continue Lasix and Coreg and norvasc. Will hold off on ACE-I or ARB given current  impairment in renal function.   Hyperlipidemia  Zocor started 2/27. Will change to pravachol at discharge per cardiology due to potential interaction with norvasc  Gout  Continue daily colchicine. Hold NSAIDS due to renal impairment.  Alcohol abuse  No signs of alcohol withdrawal. Will provide thiamine and folic acid supplementation.  Tobacco abuse  Tobacco cessation counseling.  Obesity  Pt seen by dietician and diabetes coordinator. Educated to carb modified diet  Chronic kidney disease  Baseline renal function not known. Spoke with PCP who reports most recent lab work 2010 and creatinine 1.3 at that time. Creatinine 2.6 on admission. Renal US neg for obstruction. ? Component of acute versus CKD from uncontrolled HTN and/or DM. Creatinine increased likely related to lasix as well as chronic component. Creatinine went from 2.6 to 3.2 during admission. Pt seen by renal 2/28. Recommended continuing lasix 40mg   BID. Has follow up appointment 01/20/12  Diabetes  CBG 163, 177. Hemoglobin A1c 9.8 Pt. Started on insulin this hospitalization and will continue 70/30 and SSI.  70/30 increased to 15 units BID . Pt seen by diabetes coordinator, has viewed the education videos, has demonstrated his ability to administer insulin. Will be given script for home glucose meter as well Will need to follow up with PCP in 2 weeks.    Time spent on Discharge: 40 minutes  Signed: Radene Gunning 01/13/2012, 4:30 PM  Addendum: I have reviewed clinical data, personally examined patient and discussed in detail, with both patient and PA. Have also reviewed consultation notes of Drs Murvin Natal and Renaldo Reel, and appreciate their input and recommendations, which will be implemented. I concur with PA's assessment and disposition plans. Patient is stable for discharge today.  C. Genevieve Arbaugh. MD.

## 2012-01-26 NOTE — Progress Notes (Signed)
I reviewed clinical data, and personally examined patient. I concur with PA's assessment and plan.  C.Rhys Anchondo. MD.

## 2012-02-14 ENCOUNTER — Ambulatory Visit: Payer: Self-pay | Admitting: Cardiovascular Disease

## 2012-02-18 ENCOUNTER — Telehealth: Payer: Self-pay | Admitting: Oncology

## 2012-02-18 NOTE — Telephone Encounter (Signed)
New pt referral received in scheduling 4/4. Pt contacted w/appt 4/5 (EM). Per pt he does not want appt due to he is going away and it took [place] to long to contact him. Called referring and lm for dr Shelva Majestic assistant to call me re above.

## 2012-02-18 NOTE — Telephone Encounter (Signed)
called pts home no answer no vm called cellphone and it was not in service called work number and was told I had the wrong number.  Will try home number on 04/08

## 2012-02-22 ENCOUNTER — Telehealth: Payer: Self-pay | Admitting: Oncology

## 2012-02-22 NOTE — Telephone Encounter (Signed)
S/w pt today re appt w/FS for 4/18 @ 10:30 am. Per pt he will keep appt.

## 2012-02-23 ENCOUNTER — Telehealth: Payer: Self-pay | Admitting: Oncology

## 2012-02-23 NOTE — Telephone Encounter (Signed)
Referred by Dr. Corliss Parish Dx- +UPEP/SPEP

## 2012-02-25 ENCOUNTER — Encounter: Payer: Self-pay | Admitting: Oncology

## 2012-02-25 DIAGNOSIS — D472 Monoclonal gammopathy: Secondary | ICD-10-CM

## 2012-02-25 DIAGNOSIS — N289 Disorder of kidney and ureter, unspecified: Secondary | ICD-10-CM | POA: Insufficient documentation

## 2012-02-25 HISTORY — DX: Monoclonal gammopathy: D47.2

## 2012-02-28 ENCOUNTER — Other Ambulatory Visit: Payer: Self-pay | Admitting: Lab

## 2012-02-28 ENCOUNTER — Ambulatory Visit: Payer: Self-pay

## 2012-02-28 ENCOUNTER — Ambulatory Visit: Payer: Self-pay | Admitting: Oncology

## 2012-03-01 ENCOUNTER — Encounter: Payer: Self-pay | Admitting: Oncology

## 2012-03-02 ENCOUNTER — Encounter: Payer: Self-pay | Admitting: Oncology

## 2012-03-02 ENCOUNTER — Ambulatory Visit (HOSPITAL_BASED_OUTPATIENT_CLINIC_OR_DEPARTMENT_OTHER): Payer: Self-pay | Admitting: Oncology

## 2012-03-02 ENCOUNTER — Other Ambulatory Visit (HOSPITAL_BASED_OUTPATIENT_CLINIC_OR_DEPARTMENT_OTHER): Payer: Self-pay | Admitting: Lab

## 2012-03-02 ENCOUNTER — Telehealth: Payer: Self-pay | Admitting: Oncology

## 2012-03-02 ENCOUNTER — Ambulatory Visit: Payer: Self-pay

## 2012-03-02 VITALS — BP 125/80 | HR 64 | Temp 97.7°F | Ht 72.0 in | Wt 294.6 lb

## 2012-03-02 DIAGNOSIS — R600 Localized edema: Secondary | ICD-10-CM | POA: Insufficient documentation

## 2012-03-02 DIAGNOSIS — N289 Disorder of kidney and ureter, unspecified: Secondary | ICD-10-CM

## 2012-03-02 DIAGNOSIS — D472 Monoclonal gammopathy: Secondary | ICD-10-CM

## 2012-03-02 DIAGNOSIS — R609 Edema, unspecified: Secondary | ICD-10-CM

## 2012-03-02 LAB — CBC WITH DIFFERENTIAL/PLATELET
Eosinophils Absolute: 0.1 10*3/uL (ref 0.0–0.5)
MCV: 74.9 fL — ABNORMAL LOW (ref 79.3–98.0)
MONO#: 0.5 10*3/uL (ref 0.1–0.9)
MONO%: 9.2 % (ref 0.0–14.0)
NEUT#: 3.4 10*3/uL (ref 1.5–6.5)
RBC: 5.77 10*6/uL (ref 4.20–5.82)
RDW: 13.7 % (ref 11.0–14.6)
WBC: 5.5 10*3/uL (ref 4.0–10.3)
lymph#: 1.5 10*3/uL (ref 0.9–3.3)
nRBC: 0 % (ref 0–0)

## 2012-03-02 NOTE — Progress Notes (Signed)
Patient came  In today patient is uninsured, I gave patient an EPP application, and a medicaid application. Patient stated that he was receiving unemployment and his wife works, I advised patient to turn in income for both he and his wife. Patient stated that he would send application in with correct information.

## 2012-03-02 NOTE — Patient Instructions (Signed)
1.  Positive monoclonal protein: - Possibilities:  Multiple myeloma vs MGUS (monoclonal gammopathy of undetermined significance:  Pre-myeloma; only 1 out of 4 patients with MGUS develop active myeloma) vs other related disease (amyloidosis).  2.  Work up: - Bone marrow biopsy - If bone marrow biopsy is negative; consider kidney biopsy.

## 2012-03-02 NOTE — Progress Notes (Signed)
[facility]  [phone] [phone]   [place]    Referral MD:  Dr. Corliss Parish  Reason for Referral:  Positive M-spike   HPI:  [name] is a 56 year-old [place] American man with history of HTN, DM, CKD with baseline Cr of 1.3.  He was admitted in Feb 2013 for SOB, edema; and was found to have diastolic CHF, renal insufficiency.  He was diuresed with better symptom control.  He was evaluated by Dr. Moshe Cipro due to renal insufficiency.  He had a SPEP with positive M-spike at 0.6mg /dL.  He was thus kindly referred to the [facility] for evaluation.  [name] presented to the clinic by himself today.  He still has bilateral pedal edema; however, much improved from 12/2011.  He no longer has SOB, DOE.  He has watery eyes from seasonal allergy.  Patient denies fatigue, headache, visual changes, confusion, drenching night sweats, palpable lymph node swelling, mucositis, odynophagia, dysphagia, nausea vomiting, jaundice, chest pain, palpitation, productive cough, gum bleeding, epistaxis, hematemesis, hemoptysis, abdominal pain, abdominal swelling, early satiety, melena, hematochezia, hematuria, skin rash, spontaneous bleeding, joint swelling, joint pain, heat or cold intolerance, bowel bladder incontinence, back pain, focal motor weakness, paresthesia, depression, suicidal or homocidal ideation, feeling hopelessness.     Past Medical History  Diagnosis Date  . Diabetes mellitus   . Hypertension   . Hyperlipidemia   . Gout   . Alcohol abuse   . Tobacco abuse   . Renal insufficiency   . Congestive heart disease 12/2011  . Hyperlipidemia   . Monoclonal gammopathies 02/25/2012  . Pedal edema   :  Past Surgical History  Procedure Date  . Left knee arthroscopy with generalized joint synovectomy, 01/2006  . Ganglion cyst removal x 2   :  Current Outpatient Prescriptions  Medication Sig Dispense Refill   . amLODipine (NORVASC) 5 MG tablet Take 1 tablet (5 mg total) by mouth daily.  30 tablet  0  . aspirin EC 81 MG EC tablet Take 1 tablet (81 mg total) by mouth daily.      . carvedilol (COREG) 6.25 MG tablet Take 1 tablet (6.25 mg total) by mouth 2 (two) times daily with a meal.  60 tablet  0  . furosemide (LASIX) 40 MG tablet Take 1 tablet (40 mg total) by mouth 2 (two) times daily.  60 tablet  0  . insulin aspart (NOVOLOG) 100 UNIT/ML injection Inject 0-15 Units into the skin 3 (three) times daily with meals.  1 pen  1  . insulin aspart (NOVOLOG) 100 UNIT/ML injection Inject 0-5 Units into the skin at bedtime.  1 pen  1  . insulin aspart protamine-insulin aspart (NOVOLOG 70/30) (70-30) 100 UNIT/ML injection Inject 15 Units into the skin 2 (two) times daily with a meal. Inject 15 Units into the skin 2 (two) times daily with a meal. 70/30 Insulin should be given with a meal. NOTE: PRODUCT IS Novolog MIX 70/30 --- NOT FLOORSTOCK  10 mL  0  . potassium chloride SA (K-DUR,KLOR-CON) 20 MEQ tablet Take 1 tablet (20 mEq total) by mouth 2 (two) times daily.  60 tablet  0  . pravastatin (PRAVACHOL) 40 MG tablet Take 1 tablet (40 mg total) by mouth daily.  30 tablet  0  . colchicine 0.6 MG tablet Take 0.6 mg by mouth daily.      . folic acid (FOLVITE) 1 MG tablet Take 1 tablet (1 mg total)  by mouth daily.      Marland Kitchen thiamine 100 MG tablet Take 1 tablet (100 mg total) by mouth daily.         Allergies  Allergen Reactions  . Vioxx (Rofecoxib) Other (See Comments)    Gi upset.  Marland Kitchen Penicillins   :  Family History  Problem Relation Age of Onset  . Lung cancer Father   . Diabetes Mother   . Heart disease Mother   :  History   Social History  . Marital Status: Married    Spouse Name: [name]    Number of Children: 2  . Years of Education: 12   Occupational History  . Security    Social History Main Topics  . Smoking status: Current Everyday Smoker -- 0.3 packs/day for 33 years    Types: Cigarettes   . Smokeless tobacco: Never Used  . Alcohol Use: 21.0 oz/week    42 drink(s) per week  . Drug Use: No  . Sexually Active: No   Other Topics Concern  . Not on file   Social History Narrative   Married, lives in [place] with wife.  :  Pertinent items are noted in HPI.  Exam: @IPVITALS @  General:  Obese man in no acute distress.  Eyes:  no scleral icterus.  ENT:  There were no oropharyngeal lesions.  Neck was without thyromegaly.  Lymphatics:  Negative cervical, supraclavicular or axillary adenopathy.  Respiratory: lungs were clear bilaterally without wheezing or crackles.  Cardiovascular:  Regular rate and rhythm, S1/S2, without murmur, rub or gallop.  There was 2+ bilateral pedal edema.  GI:  abdomen was soft, flat, nontender, nondistended, without organomegaly.  Muscoloskeletal:  no spinal tenderness of palpation of vertebral spine.  Skin exam was without echymosis, petichae.  Neuro exam was nonfocal.  Patient was able to get on and off exam table without assistance.  Gait was normal.  Patient was alerted and oriented.  Attention was good.   Language was appropriate.  Mood was normal without depression.  Speech was not pressured.  Thought content was not tangential.     Lab Results  Component Value Date   WBC 5.5 03/02/2012   HGB 13.5 03/02/2012   HCT 43.2 03/02/2012   PLT 206 03/02/2012   GLUCOSE 124* 01/13/2012   CHOL 245* 01/11/2012   TRIG 289* 01/11/2012   HDL 49 01/11/2012   LDLCALC 138* 01/11/2012   ALT 39 01/10/2012   AST 22 01/10/2012   NA 135 01/13/2012   K 3.6 01/13/2012   CL 98 01/13/2012   CREATININE 3.29* 01/13/2012   BUN 32* 01/13/2012   CO2 28 01/13/2012   TSH 3.289 01/10/2012   HGBA1C 9.8* 01/12/2012      Assessment and Plan:   1.  Diabetes mellitus, type II:  He is on Novolog per PCP.  2.  HTN:  Better controlled now on Amlodipine, Carvedilol, Lasix.   3.  Hyperlipidemia:  He is on Pravachol per PCP.   4.  Diastolic CHF:  euvolumic on exam today.  He is on Lasix,  Coreg per PCP.  5.  Renal insufficiency: - Ddx:  Most likely from long standing HTN, DM.  However, given positive M-spike will need to rule out myeloma.  Given his pedal edema and CHF, will also need to rule out light chain deposition disease and amyloidosis. - Work up:  24 hour urine collection for light chain, proteinuria.  If work up shows Bence Jones protein and negative amyloid on bone marrow  biopsy, I will discuss with Renal to see if kidney biopsy is appropriate.   6.  Positive M-spike: - Ddx:  MGUS vs myeloma vs  light chain deposition disease amyloidosis. - Work up:  I sent for serum light chain; UPEP.   I strongly recommended diagnostic bone marrow biopsy to rule out for myeloma and amyloidosis.  He requested that bone marrow biopsy be done by IR for sedation.  7.  Follow up:  RV with me in about 3-4 weeks to go over result and finalize diagnosis and treatment options.   The length of time of the face-to-face encounter was 45 minutes. More than 50% of time was spent counseling and coordination of care.

## 2012-03-02 NOTE — Telephone Encounter (Signed)
Gave pt appt for 03/27/12 MD only, talked to tiffany to schedule Bone Marrow, they will call pt for appt date, also faxed order to [facility] @ radiology scheduling.

## 2012-03-03 LAB — KAPPA/LAMBDA LIGHT CHAINS
Kappa free light chain: 2.72 mg/dL — ABNORMAL HIGH (ref 0.33–1.94)
Kappa:Lambda Ratio: 1.55 (ref 0.26–1.65)
Lambda Free Lght Chn: 1.75 mg/dL (ref 0.57–2.63)

## 2012-03-03 LAB — BASIC METABOLIC PANEL
BUN: 27 mg/dL — ABNORMAL HIGH (ref 6–23)
Calcium: 9.5 mg/dL (ref 8.4–10.5)
Creatinine, Ser: 2.93 mg/dL — ABNORMAL HIGH (ref 0.50–1.35)

## 2012-03-07 ENCOUNTER — Ambulatory Visit: Payer: Self-pay | Admitting: Lab

## 2012-03-08 ENCOUNTER — Ambulatory Visit: Payer: Self-pay | Admitting: Oncology

## 2012-03-08 LAB — CREATININE CLEARANCE, URINE, 24 HOUR
Creatinine, Urine: 52.6 mg/dL
Creatinine: 2.55 mg/dL — ABNORMAL HIGH (ref 0.50–1.35)
Urine Total Volume-CRCL: 3000 mL

## 2012-03-09 ENCOUNTER — Other Ambulatory Visit: Payer: Self-pay | Admitting: Radiology

## 2012-03-09 LAB — UIFE/LIGHT CHAINS/TP QN, 24-HR UR
Albumin, U: DETECTED
Alpha 1, Urine: DETECTED — AB
Free Kappa/Lambda Ratio: 7.97 ratio (ref 2.04–10.37)
Free Lambda Excretion/Day: 18.6 mg/d
Gamma Globulin, Urine: DETECTED — AB
Time: 24 hours

## 2012-03-10 ENCOUNTER — Other Ambulatory Visit: Payer: Self-pay | Admitting: Radiology

## 2012-03-13 ENCOUNTER — Ambulatory Visit (HOSPITAL_COMMUNITY)
Admission: RE | Admit: 2012-03-13 | Discharge: 2012-03-13 | Disposition: A | Discharge status: Home / Self Care | Payer: Self-pay | Source: Ambulatory Visit | Attending: Oncology | Admitting: Oncology

## 2012-03-13 DIAGNOSIS — N289 Disorder of kidney and ureter, unspecified: Secondary | ICD-10-CM | POA: Insufficient documentation

## 2012-03-13 DIAGNOSIS — E785 Hyperlipidemia, unspecified: Secondary | ICD-10-CM | POA: Insufficient documentation

## 2012-03-13 DIAGNOSIS — R718 Other abnormality of red blood cells: Secondary | ICD-10-CM | POA: Insufficient documentation

## 2012-03-13 DIAGNOSIS — I10 Essential (primary) hypertension: Secondary | ICD-10-CM | POA: Insufficient documentation

## 2012-03-13 DIAGNOSIS — F17200 Nicotine dependence, unspecified, uncomplicated: Secondary | ICD-10-CM | POA: Insufficient documentation

## 2012-03-13 DIAGNOSIS — R600 Localized edema: Secondary | ICD-10-CM

## 2012-03-13 DIAGNOSIS — E119 Type 2 diabetes mellitus without complications: Secondary | ICD-10-CM | POA: Insufficient documentation

## 2012-03-13 DIAGNOSIS — M109 Gout, unspecified: Secondary | ICD-10-CM | POA: Insufficient documentation

## 2012-03-13 DIAGNOSIS — D472 Monoclonal gammopathy: Secondary | ICD-10-CM | POA: Insufficient documentation

## 2012-03-13 LAB — CBC
Platelets: 215 10*3/uL (ref 150–400)
RDW: 13.7 % (ref 11.5–15.5)
WBC: 5.5 10*3/uL (ref 4.0–10.5)

## 2012-03-13 LAB — APTT: aPTT: 42 seconds — ABNORMAL HIGH (ref 24–37)

## 2012-03-13 LAB — GLUCOSE, CAPILLARY: Glucose-Capillary: 159 mg/dL — ABNORMAL HIGH (ref 70–99)

## 2012-03-13 LAB — PROTIME-INR: INR: 0.96 (ref 0.00–1.49)

## 2012-03-13 MED ORDER — MIDAZOLAM HCL 5 MG/5ML IJ SOLN
INTRAMUSCULAR | Status: AC | PRN
  Administered 2012-03-13: 4 mg via INTRAVENOUS

## 2012-03-13 MED ORDER — MIDAZOLAM HCL 2 MG/2ML IJ SOLN
INTRAMUSCULAR | Status: AC
  Filled 2012-03-13: qty 4

## 2012-03-13 MED ORDER — FENTANYL CITRATE 0.05 MG/ML IJ SOLN
INTRAMUSCULAR | Status: AC | PRN
  Administered 2012-03-13: 100 ug via INTRAVENOUS

## 2012-03-13 MED ORDER — FENTANYL CITRATE 0.05 MG/ML IJ SOLN
INTRAMUSCULAR | Status: AC
  Filled 2012-03-13: qty 4

## 2012-03-13 MED ORDER — SODIUM CHLORIDE 0.9 % IV SOLN
INTRAVENOUS | Status: DC
  Administered 2012-03-13: 08:00:00 via INTRAVENOUS

## 2012-03-13 NOTE — Progress Notes (Signed)
Ambulated in room and tolerated this well. dsg to post iliac area CDI

## 2012-03-13 NOTE — H&P (Signed)
[name] is an 56 y.o. male.   Chief Complaint: "I'm here for a bone marrow biopsy" HPI: Patient with history of renal insufficiency/proteinuria and recent SPEP revealing positive M-Spike presents today for CT guided bone marrow biopsy to rule out myeloma vs MGUS vs amyloidosis.  Past Medical History  Diagnosis Date  . Diabetes mellitus   . Hypertension   . Hyperlipidemia   . Gout   . Alcohol abuse   . Tobacco abuse   . Renal insufficiency   . Congestive heart disease 12/2011  . Hyperlipidemia   . Monoclonal gammopathies 02/25/2012  . Pedal edema     Past Surgical History  Procedure Date  . Left knee arthroscopy with generalized joint synovectomy, 01/2006  . Ganglion cyst removal x 2     Family History  Problem Relation Age of Onset  . Lung cancer Father   . Diabetes Mother   . Heart disease Mother    Social History:  reports that he has been smoking Cigarettes.  He has a 9.9 pack-year smoking history. He has never used smokeless tobacco. He reports that he drinks about 21 ounces of alcohol per week. He reports that he does not use illicit drugs.  Allergies:  Allergies  Allergen Reactions  . Vioxx (Rofecoxib) Other (See Comments)    Gi upset.  Marland Kitchen Penicillins      (Not in a hospital admission)  Results for orders placed during the hospital encounter of 03/13/12 (from the past 48 hour(s))  APTT     Status: Abnormal   Collection Time   03/13/12  8:00 AM      Component Value Range Comment   aPTT 42 (*) 24 - 37 (seconds)   CBC     Status: Abnormal (Preliminary result)   Collection Time   03/13/12  8:00 AM      Component Value Range Comment   WBC 5.5  4.0 - 10.5 (K/uL)    RBC 5.76  4.22 - 5.81 (MIL/uL)    Hemoglobin 13.8  13.0 - 17.0 (g/dL)    HCT 42.7  39.0 - 52.0 (%)    MCV 74.1 (*) 78.0 - 100.0 (fL)    MCH 24.0 (*) 26.0 - 34.0 (pg)    MCHC 32.3  30.0 - 36.0 (g/dL)    RDW 13.7  11.5 - 15.5 (%)    Platelets PENDING  150 - 400 (K/uL)   PROTIME-INR     Status:  Normal   Collection Time   03/13/12  8:00 AM      Component Value Range Comment   Prothrombin Time 13.0  11.6 - 15.2 (seconds)    INR 0.96  0.00 - 1.49     Results for orders placed during the hospital encounter of 03/13/12  APTT      Component Value Range   aPTT 42 (*) 24 - 37 (seconds)  CBC      Component Value Range   WBC 5.5  4.0 - 10.5 (K/uL)   RBC 5.76  4.22 - 5.81 (MIL/uL)   Hemoglobin 13.8  13.0 - 17.0 (g/dL)   HCT 42.7  39.0 - 52.0 (%)   MCV 74.1 (*) 78.0 - 100.0 (fL)   MCH 24.0 (*) 26.0 - 34.0 (pg)   MCHC 32.3  30.0 - 36.0 (g/dL)   RDW 13.7  11.5 - 15.5 (%)   Platelets 215  150 - 400 (K/uL)  PROTIME-INR      Component Value Range   Prothrombin Time 13.0  11.6 - 15.2 (seconds)   INR 0.96  0.00 - 1.49     Review of Systems  Constitutional: Negative for fever and chills.  Respiratory: Negative for cough and shortness of breath.   Cardiovascular: Negative for chest pain.  Gastrointestinal: Negative for nausea, vomiting and abdominal pain.  Musculoskeletal: Negative for back pain.  Neurological: Negative for headaches.  Endo/Heme/Allergies: Does not bruise/bleed easily.   Vitals:   BP  155/83  HR  67  R 18  TEMP  98  O2 SATS   100% RA Physical Exam  Constitutional: He is oriented to person, place, and time. He appears well-developed and well-nourished.  Cardiovascular: Normal rate and regular rhythm.   Respiratory:       Distant but clear BS bilaterally  GI: Soft. Bowel sounds are normal. There is no tenderness.       obese  Musculoskeletal: Normal range of motion.       Trace to 1 + edema bilat  Neurological: He is alert and oriented to person, place, and time.     Assessment/Plan Patient with renal insufficiency/proteinuria and recent SPEP revealing positive M-spike; plan is for CT guided bone marrow biopsy. Details/risks of procedure d/w pt /wife with their understanding and consent.  Rona Tomson,D KEVIN 03/13/2012, 8:34 AM

## 2012-03-13 NOTE — Discharge Instructions (Signed)
Do not drive  For 24 hours Do not go into public places today May resume your regular diet and take home medications as usual May experience small amount of tingling in leg (biopsy side) May take shower and remove bandage in am For any questions or concerns, call dr If bleeding occurs at site, hold pressure x10 minutes  If continues, call doctor

## 2012-03-13 NOTE — Procedures (Signed)
CT bone marrow core biopsy No complication No blood loss. See complete dictation in [facility].

## 2012-03-27 ENCOUNTER — Ambulatory Visit: Payer: Self-pay | Admitting: Oncology

## 2012-03-27 NOTE — Patient Instructions (Addendum)
A.  Interpretation of bone marrow biopsy: - 4% plasma cell.  But not enough for me to label this presentation as active multiple myeloma.  Your diagnosis is best described as MGUS (monoclonal gammopathy of undetermined significance).   B.  Proteinuria:  24-hour urine collection showed more than 3gm of protein in the urine.  This is due to diabetes.  - You need to follow up with your kidney doctor for monitor.   C.  Follow up with [place] in about 4 months to ensure that you have not developed active myeloma from MGUS.

## 2012-07-23 NOTE — Progress Notes (Signed)
No show.  Follow up prn.  

## 2014-01-04 ENCOUNTER — Inpatient Hospital Stay (HOSPITAL_COMMUNITY)
Admission: EM | Admit: 2014-01-04 | Discharge: 2014-01-10 | DRG: 673 | Disposition: A | Discharge status: Home / Self Care | Payer: Medicaid Other | Attending: Internal Medicine | Admitting: Internal Medicine

## 2014-01-04 ENCOUNTER — Emergency Department (HOSPITAL_COMMUNITY): Payer: Medicaid Other

## 2014-01-04 ENCOUNTER — Encounter (HOSPITAL_COMMUNITY): Payer: Self-pay | Admitting: Emergency Medicine

## 2014-01-04 ENCOUNTER — Ambulatory Visit
Admission: RE | Admit: 2014-01-04 | Discharge: 2014-01-04 | Disposition: A | Discharge status: Home / Self Care | Payer: Medicaid Other | Source: Ambulatory Visit | Attending: Family Medicine | Admitting: Family Medicine

## 2014-01-04 ENCOUNTER — Other Ambulatory Visit: Payer: Self-pay | Admitting: Family Medicine

## 2014-01-04 DIAGNOSIS — J209 Acute bronchitis, unspecified: Secondary | ICD-10-CM

## 2014-01-04 DIAGNOSIS — Z888 Allergy status to other drugs, medicaments and biological substances status: Secondary | ICD-10-CM

## 2014-01-04 DIAGNOSIS — R209 Unspecified disturbances of skin sensation: Secondary | ICD-10-CM | POA: Diagnosis not present

## 2014-01-04 DIAGNOSIS — N269 Renal sclerosis, unspecified: Secondary | ICD-10-CM | POA: Diagnosis present

## 2014-01-04 DIAGNOSIS — Z833 Family history of diabetes mellitus: Secondary | ICD-10-CM

## 2014-01-04 DIAGNOSIS — E872 Acidosis, unspecified: Secondary | ICD-10-CM | POA: Diagnosis present

## 2014-01-04 DIAGNOSIS — N179 Acute kidney failure, unspecified: Principal | ICD-10-CM | POA: Diagnosis present

## 2014-01-04 DIAGNOSIS — M109 Gout, unspecified: Secondary | ICD-10-CM | POA: Diagnosis present

## 2014-01-04 DIAGNOSIS — R600 Localized edema: Secondary | ICD-10-CM

## 2014-01-04 DIAGNOSIS — F17200 Nicotine dependence, unspecified, uncomplicated: Secondary | ICD-10-CM | POA: Diagnosis present

## 2014-01-04 DIAGNOSIS — Z880 Allergy status to penicillin: Secondary | ICD-10-CM

## 2014-01-04 DIAGNOSIS — E1129 Type 2 diabetes mellitus with other diabetic kidney complication: Secondary | ICD-10-CM | POA: Diagnosis present

## 2014-01-04 DIAGNOSIS — N289 Disorder of kidney and ureter, unspecified: Secondary | ICD-10-CM

## 2014-01-04 DIAGNOSIS — N189 Chronic kidney disease, unspecified: Secondary | ICD-10-CM

## 2014-01-04 DIAGNOSIS — N186 End stage renal disease: Secondary | ICD-10-CM | POA: Diagnosis present

## 2014-01-04 DIAGNOSIS — Z801 Family history of malignant neoplasm of trachea, bronchus and lung: Secondary | ICD-10-CM

## 2014-01-04 DIAGNOSIS — Z9119 Patient's noncompliance with other medical treatment and regimen: Secondary | ICD-10-CM

## 2014-01-04 DIAGNOSIS — I10 Essential (primary) hypertension: Secondary | ICD-10-CM

## 2014-01-04 DIAGNOSIS — Z8249 Family history of ischemic heart disease and other diseases of the circulatory system: Secondary | ICD-10-CM

## 2014-01-04 DIAGNOSIS — J189 Pneumonia, unspecified organism: Secondary | ICD-10-CM | POA: Diagnosis present

## 2014-01-04 DIAGNOSIS — F1010 Alcohol abuse, uncomplicated: Secondary | ICD-10-CM

## 2014-01-04 DIAGNOSIS — Z79899 Other long term (current) drug therapy: Secondary | ICD-10-CM

## 2014-01-04 DIAGNOSIS — Z794 Long term (current) use of insulin: Secondary | ICD-10-CM

## 2014-01-04 DIAGNOSIS — N2581 Secondary hyperparathyroidism of renal origin: Secondary | ICD-10-CM | POA: Diagnosis present

## 2014-01-04 DIAGNOSIS — N058 Unspecified nephritic syndrome with other morphologic changes: Secondary | ICD-10-CM | POA: Diagnosis present

## 2014-01-04 DIAGNOSIS — D509 Iron deficiency anemia, unspecified: Secondary | ICD-10-CM | POA: Diagnosis present

## 2014-01-04 DIAGNOSIS — E8770 Fluid overload, unspecified: Secondary | ICD-10-CM

## 2014-01-04 DIAGNOSIS — E119 Type 2 diabetes mellitus without complications: Secondary | ICD-10-CM | POA: Diagnosis present

## 2014-01-04 DIAGNOSIS — E875 Hyperkalemia: Secondary | ICD-10-CM | POA: Diagnosis present

## 2014-01-04 DIAGNOSIS — Z6841 Body Mass Index (BMI) 40.0 and over, adult: Secondary | ICD-10-CM

## 2014-01-04 DIAGNOSIS — I509 Heart failure, unspecified: Secondary | ICD-10-CM | POA: Diagnosis present

## 2014-01-04 DIAGNOSIS — E785 Hyperlipidemia, unspecified: Secondary | ICD-10-CM

## 2014-01-04 DIAGNOSIS — Z720 Tobacco use: Secondary | ICD-10-CM

## 2014-01-04 DIAGNOSIS — I120 Hypertensive chronic kidney disease with stage 5 chronic kidney disease or end stage renal disease: Secondary | ICD-10-CM | POA: Diagnosis present

## 2014-01-04 DIAGNOSIS — D472 Monoclonal gammopathy: Secondary | ICD-10-CM | POA: Diagnosis present

## 2014-01-04 DIAGNOSIS — Z91199 Patient's noncompliance with other medical treatment and regimen due to unspecified reason: Secondary | ICD-10-CM

## 2014-01-04 DIAGNOSIS — E669 Obesity, unspecified: Secondary | ICD-10-CM

## 2014-01-04 DIAGNOSIS — Z7982 Long term (current) use of aspirin: Secondary | ICD-10-CM

## 2014-01-04 DIAGNOSIS — R911 Solitary pulmonary nodule: Secondary | ICD-10-CM | POA: Diagnosis present

## 2014-01-04 LAB — BASIC METABOLIC PANEL
BUN: 69 mg/dL — ABNORMAL HIGH (ref 6–23)
CHLORIDE: 104 meq/L (ref 96–112)
CO2: 19 meq/L (ref 19–32)
Calcium: 8.5 mg/dL (ref 8.4–10.5)
Creatinine, Ser: 9.41 mg/dL — ABNORMAL HIGH (ref 0.50–1.35)
GFR calc Af Amer: 6 mL/min — ABNORMAL LOW (ref >90)
GFR calc non Af Amer: 5 mL/min — ABNORMAL LOW (ref >90)
Glucose, Bld: 149 mg/dL — ABNORMAL HIGH (ref 70–99)
POTASSIUM: 5.3 meq/L (ref 3.7–5.3)
SODIUM: 139 meq/L (ref 137–147)

## 2014-01-04 LAB — CBC
HCT: 29.4 % — ABNORMAL LOW (ref 39.0–52.0)
Hemoglobin: 8.9 g/dL — ABNORMAL LOW (ref 13.0–17.0)
MCH: 22.9 pg — ABNORMAL LOW (ref 26.0–34.0)
MCHC: 30.3 g/dL (ref 30.0–36.0)
MCV: 75.8 fL — ABNORMAL LOW (ref 78.0–100.0)
PLATELETS: 225 10*3/uL (ref 150–400)
RBC: 3.88 MIL/uL — AB (ref 4.22–5.81)
RDW: 15.9 % — ABNORMAL HIGH (ref 11.5–15.5)
WBC: 5.9 10*3/uL (ref 4.0–10.5)

## 2014-01-04 LAB — CBG MONITORING, ED: Glucose-Capillary: 95 mg/dL (ref 70–99)

## 2014-01-04 LAB — I-STAT TROPONIN, ED: TROPONIN I, POC: 0.03 ng/mL (ref 0.00–0.08)

## 2014-01-04 LAB — GLUCOSE, CAPILLARY: GLUCOSE-CAPILLARY: 128 mg/dL — AB (ref 70–99)

## 2014-01-04 LAB — PRO B NATRIURETIC PEPTIDE: PRO B NATRI PEPTIDE: 2553 pg/mL — AB (ref 0–125)

## 2014-01-04 MED ORDER — FUROSEMIDE 10 MG/ML IJ SOLN
100.0000 mg | Freq: Once | INTRAMUSCULAR | Status: AC
Start: 2014-01-04 — End: 2014-01-04
  Administered 2014-01-04: 100 mg via INTRAVENOUS
  Filled 2014-01-04: qty 10

## 2014-01-04 MED ORDER — CARVEDILOL 6.25 MG PO TABS
6.2500 mg | ORAL_TABLET | Freq: Two times a day (BID) | ORAL | Status: DC
  Administered 2014-01-04 – 2014-01-09 (×9): 6.25 mg via ORAL
  Filled 2014-01-04 (×14): qty 1

## 2014-01-04 MED ORDER — FUROSEMIDE 10 MG/ML IJ SOLN: 80.0000 mg | Freq: Four times a day (QID) | INTRAMUSCULAR | Status: DC

## 2014-01-04 MED ORDER — INSULIN ASPART PROT & ASPART (70-30 MIX) 100 UNIT/ML ~~LOC~~ SUSP
12.0000 [IU] | Freq: Every day | SUBCUTANEOUS | Status: DC
  Administered 2014-01-05 – 2014-01-09 (×5): 12 [IU] via SUBCUTANEOUS
  Filled 2014-01-04 (×2): qty 10

## 2014-01-04 MED ORDER — FUROSEMIDE 10 MG/ML IJ SOLN
160.0000 mg | Freq: Four times a day (QID) | INTRAVENOUS | Status: DC
  Filled 2014-01-04 (×2): qty 16

## 2014-01-04 MED ORDER — ASPIRIN 325 MG PO TABS
325.0000 mg | ORAL_TABLET | Freq: Every day | ORAL | Status: DC
  Administered 2014-01-05 – 2014-01-10 (×5): 325 mg via ORAL
  Filled 2014-01-04 (×7): qty 1

## 2014-01-04 MED ORDER — INSULIN ASPART 100 UNIT/ML ~~LOC~~ SOLN: 0.0000 [IU] | SUBCUTANEOUS | Status: DC

## 2014-01-04 MED ORDER — FUROSEMIDE 40 MG PO TABS
40.0000 mg | ORAL_TABLET | Freq: Two times a day (BID) | ORAL | Status: DC
  Filled 2014-01-04 (×3): qty 1

## 2014-01-04 MED ORDER — HEPARIN SODIUM (PORCINE) 5000 UNIT/ML IJ SOLN
5000.0000 [IU] | Freq: Three times a day (TID) | INTRAMUSCULAR | Status: DC
  Administered 2014-01-05 – 2014-01-09 (×13): 5000 [IU] via SUBCUTANEOUS
  Filled 2014-01-04 (×19): qty 1

## 2014-01-04 MED ORDER — OXYCODONE HCL 5 MG PO TABS
5.0000 mg | ORAL_TABLET | ORAL | Status: DC | PRN
  Administered 2014-01-04 – 2014-01-10 (×10): 5 mg via ORAL
  Filled 2014-01-04 (×10): qty 1

## 2014-01-04 MED ORDER — DEXTROSE 5 % IV SOLN: 500.0000 mg | Freq: Once | INTRAVENOUS | Status: DC

## 2014-01-04 MED ORDER — DEXTROSE 5 % IV SOLN: 1.0000 g | Freq: Once | INTRAVENOUS | Status: DC

## 2014-01-04 NOTE — Progress Notes (Signed)
Patient complaining of sharp pain in right side.  MD notified; oxycodone 5 mg ordered.  Will continue to assess.

## 2014-01-04 NOTE — ED Notes (Signed)
Pt in stating over the last two weeks he has noted increased shortness of breath with exertion and swelling in lower extremities, states he went to his MD thinking he had a cold and was told to come here in reference to some abnormal renal labs, pt speaking in full sentences, no distress noted

## 2014-01-04 NOTE — ED Notes (Signed)
Patient transported to CT 

## 2014-01-04 NOTE — ED Notes (Signed)
Dr. Walden at bedside 

## 2014-01-04 NOTE — Progress Notes (Signed)
Patient arrived to unit via stretcher with nurse tech.  Family with patient.  Patient oriented to unit and staff.  Patient educated on call bell; verbalized understanding.  Call bell within reach.  Bed in lowest position.  Patient had no complaints of pain at this time.  Vital signs stable.  Will continue to monitor.

## 2014-01-04 NOTE — ED Notes (Signed)
Dr. Gardner at bedside 

## 2014-01-04 NOTE — ED Provider Notes (Signed)
CSN: [id number]     Arrival date & time 01/04/14  1626 History   First MD Initiated Contact with Patient 01/04/14 1827     Chief Complaint  Patient presents with  . Shortness of Breath  . Abnormal Lab     (Consider location/radiation/quality/duration/timing/severity/associated sxs/prior Treatment) Patient is a 58 y.o. male presenting with shortness of breath. The history is provided by the patient.  Shortness of Breath Severity:  Moderate Onset quality:  Gradual Duration:  1 month Timing:  Constant Progression:  Worsening Chronicity:  New Context: not activity and not URI   Relieved by:  Nothing Worsened by:  Nothing tried Associated symptoms: no abdominal pain, no chest pain, no cough, no fever, no rash, no vomiting and no wheezing     Past Medical History  Diagnosis Date  . Diabetes mellitus   . Hypertension   . Hyperlipidemia   . Gout   . Alcohol abuse   . Tobacco abuse   . Renal insufficiency   . Congestive heart disease 12/2011  . Hyperlipidemia   . Monoclonal gammopathies 02/25/2012  . Pedal edema    Past Surgical History  Procedure Laterality Date  . Left knee arthroscopy with generalized joint synovectomy,  01/2006  . Ganglion cyst removal x 2     Family History  Problem Relation Age of Onset  . Lung cancer Father   . Diabetes Mother   . Heart disease Mother    History  Substance Use Topics  . Smoking status: Current Every Day Smoker -- 0.30 packs/day for 33 years    Types: Cigarettes  . Smokeless tobacco: Never Used  . Alcohol Use: 21.0 oz/week    42 drink(s) per week    Review of Systems  Constitutional: Negative for fever and chills.  Respiratory: Positive for shortness of breath (with exertion). Negative for cough, wheezing and stridor.   Cardiovascular: Negative for chest pain.  Gastrointestinal: Negative for vomiting and abdominal pain.  Skin: Negative for rash.  All other systems reviewed and are negative.      Allergies  Penicillins  and Vioxx  Home Medications   Current Outpatient Rx  Name  Route  Sig  Dispense  Refill  . allopurinol (ZYLOPRIM) 100 MG tablet   Oral   Take 100 mg by mouth daily.         Marland Kitchen aspirin 325 MG tablet   Oral   Take 325 mg by mouth daily.         . carvedilol (COREG) 6.25 MG tablet   Oral   Take 1 tablet (6.25 mg total) by mouth 2 (two) times daily with a meal.   60 tablet   0   . insulin aspart protamine-insulin aspart (NOVOLOG 70/30) (70-30) 100 UNIT/ML injection   Subcutaneous   Inject 12 Units into the skin daily with breakfast.          . OVER THE COUNTER MEDICATION   Apply externally   Apply 1 application topically daily as needed (for muscle pains). "flex all" spray         . potassium chloride SA (K-DUR,KLOR-CON) 20 MEQ tablet   Oral   Take 1 tablet (20 mEq total) by mouth 2 (two) times daily.   60 tablet   0   . Pseudoeph-Doxylamine-DM-APAP 30-6.25-15-325 MG CAPS   Oral   Take 2 capsules by mouth at bedtime as needed (for cold).          BP 194/89  Pulse 82  Temp(Src) 98.8  F (37.1 C) (Oral)  Resp 20  Wt 330 lb (149.687 kg)  SpO2 100% Physical Exam  Nursing note and vitals reviewed. Constitutional: He is oriented to person, place, and time. He appears well-developed and well-nourished. No distress.  HENT:  Head: Normocephalic and atraumatic.  Mouth/Throat: No oropharyngeal exudate.  Eyes: EOM are normal. Pupils are equal, round, and reactive to light.  Neck: Normal range of motion. Neck supple. No JVD present.  Cardiovascular: Normal rate and regular rhythm.  Exam reveals no friction rub.   No murmur heard. Pulmonary/Chest: Breath sounds normal. Tachypnea noted. No respiratory distress. He has no decreased breath sounds. He has no wheezes. He has no rales.  Mild labored respirations. Still able to speak in full sentences  Abdominal: He exhibits no distension. There is no tenderness. There is no rebound.  Musculoskeletal: Normal range of motion.  He exhibits edema (2+ up entire tibia).  Neurological: He is alert and oriented to person, place, and time. No cranial nerve deficit. He exhibits normal muscle tone.  Skin: No rash noted. He is not diaphoretic.    ED Course  Procedures (including critical care time) Labs Review Labs Reviewed  CBC - Abnormal; Notable for the following:    RBC 3.88 (*)    Hemoglobin 8.9 (*)    HCT 29.4 (*)    MCV 75.8 (*)    MCH 22.9 (*)    RDW 15.9 (*)    All other components within normal limits  BASIC METABOLIC PANEL - Abnormal; Notable for the following:    Glucose, Bld 149 (*)    BUN 69 (*)    Creatinine, Ser 9.41 (*)    GFR calc non Af Amer 5 (*)    GFR calc Af Amer 6 (*)    All other components within normal limits  PRO B NATRIURETIC PEPTIDE - Abnormal; Notable for the following:    Pro B Natriuretic peptide (BNP) 2553.0 (*)    All other components within normal limits  Randolm Idol, ED   Imaging Review Dg Chest 2 View  01/04/2014   CLINICAL DATA:  Bronchitis. Shortness of breath. History of congestive heart failure.  EXAM: CHEST  2 VIEW  COMPARISON:  01/10/2012.  04/02/2008.  FINDINGS: Cardiac silhouette is enlarged. The aorta is unfolded. There is extensive overlying soft tissue density. I do not think there is any dense consolidation, collapse or a fusion. Difficulty completely rule out a patchy infiltrate in the right lung, though I favor this is overlying soft tissue. The right hilum is prominent, more so than seen on some of the older studies. Because of the question of right hilar enlargement and right pulmonary parenchymal density, I would suggest that a chest CT to ensure that we are not missing neoplastic disease. No effusions. No significant bony finding.  IMPRESSION: Cardiac enlargement.  Unfolded aorta up.  Question right pulmonary parenchymal density in the lower lung with enlarged right hilum. Whereas this could be due to inflammatory disease, consider chest CT to rule out  neoplastic disease.   Electronically Signed   By: Nelson Chimes M.D.   On: 01/04/2014 11:47    EKG Interpretation    Date/Time:  Friday January 04 2014 16:34:52 EST Ventricular Rate:  88 PR Interval:  142 QRS Duration: 72 QT Interval:  370 QTC Calculation: 447 R Axis:   -4 Text Interpretation:  Normal sinus rhythm Possible Inferior infarct , age undetermined Cannot rule out Anterior infarct , age undetermined No acute changes Confirmed by  Mingo Amber  MD, Benjie Karvonen [phone]) on 01/04/2014 6:31:44 PM            MDM   Final diagnoses:  Acute on chronic renal failure  Volume overload    42M presents from clinic with acute on chronic renal failure. Hx of chronic kidney disease, followed by [facility]. Patient having progressively worsening SOB with DOE and inability to lie flat. Now having peripheral edema that is persistent also. Worsening over past 4-5 days. Patient here without JVD, no rales. Speaking in full sentences, mildly labored. No wheezing.   Patient's CXR shows right sided pneumonia, possibly post-obstructive mass. Cultures obtained, treated with CAP coverage and will obtain CT chest. Nephrology states they will see patient in the morning. Admitted to medicine.     Osvaldo Shipper, MD 01/04/14 2352

## 2014-01-04 NOTE — H&P (Signed)
[facility] History and Physical  [name] [id number] DOB: 03-25-1956 DOA: 01/04/2014  Referring physician: EDP PCP: Gara Kroner, MD   Chief Complaint: SOB   HPI: [name] is a 58 y.o. male who presents to the ED with 3 day history of worsening cough, productive of whitish sputum; SOB, worsening peripheral edema.  Patient has h/o severe CKD but isnt very compliant with follow up.  After extensive review of history and meds with family, ultimately it is determined that the patient ran out of his lasix in December, and despite prodding from family members, did not follow up with nephrology to get this refilled.  Review of Systems: Systems reviewed.  As above, otherwise negative  Past Medical History  Diagnosis Date  . Diabetes mellitus   . Hypertension   . Hyperlipidemia   . Gout   . Alcohol abuse   . Tobacco abuse   . Renal insufficiency   . Congestive heart disease 12/2011  . Hyperlipidemia   . Monoclonal gammopathies 02/25/2012  . Pedal edema    Past Surgical History  Procedure Laterality Date  . Left knee arthroscopy with generalized joint synovectomy,  01/2006  . Ganglion cyst removal x 2     Social History:  reports that he has been smoking Cigarettes.  He has a 9.9 pack-year smoking history. He has never used smokeless tobacco. He reports that he drinks about 21.0 ounces of alcohol per week. He reports that he does not use illicit drugs.  Allergies  Allergen Reactions  . Penicillins Nausea And Vomiting and Other (See Comments)    Stomach pains also  . Vioxx [Rofecoxib] Nausea Only and Other (See Comments)    Gi upset.    Family History  Problem Relation Age of Onset  . Lung cancer Father   . Diabetes Mother   . Heart disease Mother      Prior to Admission medications   Medication Sig Start Date End Date Taking? Authorizing Provider  allopurinol (ZYLOPRIM) 100 MG tablet Take 100 mg by mouth daily.   Yes Historical Provider, MD  aspirin  325 MG tablet Take 325 mg by mouth daily.   Yes Historical Provider, MD  carvedilol (COREG) 6.25 MG tablet Take 1 tablet (6.25 mg total) by mouth 2 (two) times daily with a meal. 01/13/12 01/04/14 Yes Lezlie Octave Black, NP  insulin aspart protamine-insulin aspart (NOVOLOG 70/30) (70-30) 100 UNIT/ML injection Inject 12 Units into the skin daily with breakfast.    Yes Historical Provider, MD  OVER THE COUNTER MEDICATION Apply 1 application topically daily as needed (for muscle pains). "flex all" spray   Yes Historical Provider, MD  potassium chloride SA (K-DUR,KLOR-CON) 20 MEQ tablet Take 1 tablet (20 mEq total) by mouth 2 (two) times daily. 01/13/12 01/04/14 Yes Radene Gunning, NP  Pseudoeph-Doxylamine-DM-APAP 30-6.25-15-325 MG CAPS Take 2 capsules by mouth at bedtime as needed (for cold).   Yes Historical Provider, MD   Physical Exam: Filed Vitals:   01/04/14 2100  BP: 165/100  Pulse: 80  Temp:   Resp: 21    BP 165/100  Pulse 80  Temp(Src) 98.8 F (37.1 C) (Oral)  Resp 21  Wt 149.687 kg (330 lb)  SpO2 99%  General Appearance:    Alert, oriented, no distress, appears stated age  Head:    Normocephalic, atraumatic  Eyes:    PERRL, EOMI, sclera non-icteric        Nose:   Nares without drainage or epistaxis. Mucosa, turbinates normal  Throat:   Moist mucous membranes. Oropharynx without erythema or exudate.  Neck:   Supple. No carotid bruits.  No thyromegaly.  No lymphadenopathy.   Back:     No CVA tenderness, no spinal tenderness  Lungs:     Faint breath sounds due to patient size  Chest wall:    No tenderness to palpitation  Heart:    Regular rate and rhythm without murmurs, gallops, rubs  Abdomen:     Soft, non-tender, obese, normal bowel sounds, no organomegaly  Genitalia:    deferred  Rectal:    deferred  Extremities:   4+ pitting edema BLE  Pulses:   2+ and symmetric all extremities  Skin:   Skin color, texture, turgor normal, no rashes or lesions  Lymph nodes:   Cervical,  supraclavicular, and axillary nodes normal  Neurologic:   CNII-XII intact. Normal strength, sensation and reflexes      throughout    Labs on Admission:  Basic Metabolic Panel:  Recent Labs Lab 01/04/14 1649  NA 139  K 5.3  CL 104  CO2 19  GLUCOSE 149*  BUN 69*  CREATININE 9.41*  CALCIUM 8.5   Liver Function Tests: No results found for this basename: AST, ALT, ALKPHOS, BILITOT, PROT, ALBUMIN,  in the last 168 hours No results found for this basename: LIPASE, AMYLASE,  in the last 168 hours No results found for this basename: AMMONIA,  in the last 168 hours CBC:  Recent Labs Lab 01/04/14 1649  WBC 5.9  HGB 8.9*  HCT 29.4*  MCV 75.8*  PLT 225   Cardiac Enzymes: No results found for this basename: CKTOTAL, CKMB, CKMBINDEX, TROPONINI,  in the last 168 hours  BNP (last 3 results)  Recent Labs  01/04/14 1649  PROBNP 2553.0*   CBG:  Recent Labs Lab 01/04/14 2041  GLUCAP 95    Radiological Exams on Admission: Dg Chest 2 View  01/04/2014   CLINICAL DATA:  Right-sided chest pain with chest congestion and shortness of breath.  EXAM: CHEST  2 VIEW  COMPARISON:  01/04/2014 and 01/10/2012 and 04/02/2008  FINDINGS: There is cardiomegaly. Pulmonary vascularity appears normal. There is a faint right perihilar infiltrate with fullness of the right hilum. There are no effusions. Left lung is clear. No acute osseous abnormality.  IMPRESSION: Right perihilar infiltrate with fullness in the right hilum and in the azygos region. This could represent pneumonia with adenopathy but the possibility of adenopathy and a mass with postobstructive infiltrate should be considered. Follow-up is recommended.   Electronically Signed   By: Rozetta Nunnery M.D.   On: 01/04/2014 19:10   Dg Chest 2 View  01/04/2014   CLINICAL DATA:  Bronchitis. Shortness of breath. History of congestive heart failure.  EXAM: CHEST  2 VIEW  COMPARISON:  01/10/2012.  04/02/2008.  FINDINGS: Cardiac silhouette is  enlarged. The aorta is unfolded. There is extensive overlying soft tissue density. I do not think there is any dense consolidation, collapse or a fusion. Difficulty completely rule out a patchy infiltrate in the right lung, though I favor this is overlying soft tissue. The right hilum is prominent, more so than seen on some of the older studies. Because of the question of right hilar enlargement and right pulmonary parenchymal density, I would suggest that a chest CT to ensure that we are not missing neoplastic disease. No effusions. No significant bony finding.  IMPRESSION: Cardiac enlargement.  Unfolded aorta up.  Question right pulmonary parenchymal density in the lower lung  with enlarged right hilum. Whereas this could be due to inflammatory disease, consider chest CT to rule out neoplastic disease.   Electronically Signed   By: Nelson Chimes M.D.   On: 01/04/2014 11:47   Ct Chest Wo Contrast  01/04/2014   CLINICAL DATA:  Short of breath.  Right flank lower ribcage pain.  EXAM: CT CHEST WITHOUT CONTRAST  TECHNIQUE: Multidetector CT imaging of the chest was performed following the standard protocol without IV contrast.  COMPARISON:  Chest radiograph 01/04/2014  FINDINGS: No pleural effusion identified. Peripheral and basilar predominant interstitial reticulation is noted. Right upper lobe peribronchovascular densities are identified, image 24/series 3. A few scattered peribronchovascular densities are noted in the central right lower lobe. Right apical nodule is identified measuring 6 mm, image 10/series 3.  The heart size is mild to moderately enlarged. There is no pericardial effusion. Prominent mediastinal lymph nodes are identified. Left paratracheal lymph node measures 1.6 cm, image 19/series 2. Right paratracheal lymph node is equal to 1.2 cm, image 20/series 2. The sub- carinal lymph node measures 1.6 cm, image 28/series 2.  There is no axillary or supraclavicular adenopathy. Incidental imaging through  the upper abdomen is unremarkable.  Review of the visualized bony structures demonstrates no aggressive lytic or sclerotic bone lesions.  IMPRESSION: 1. Right upper lobe peribronchovascular densities are favored to represent early pneumonia. Findings may account for the patient's right sided chest pain. 2. Right apical nodule measures 6 mm. If the patient is at high risk for bronchogenic carcinoma, follow-up chest CT at 6-12 months is recommended. If the patient is at low risk for bronchogenic carcinoma, follow-up chest CT at 12 months is recommended. This recommendation follows the consensus statement: Guidelines for Management of Small Pulmonary Nodules Detected on CT Scans: A Statement from the [place] as published in Radiology 2005;237:395-400. 3. Enlarged mediastinal lymph nodes are nonspecific. In the setting of infection it is conceivable that these could be reactive in etiology. Differential considerations include metastatic adenopathy or lymphoproliferative disorder.   Electronically Signed   By: Kerby Moors M.D.   On: 01/04/2014 20:27    EKG: Independently reviewed.  Assessment/Plan Principal Problem:   Acute on chronic kidney failure Active Problems:   Diabetes mellitus   Monoclonal gammopathies   AKI on CKD - unclear what patients baseline now is, I am suspicious however that the current BUN and Creatinine may be the patients baseline and his current symptoms represent progression to ESRD.  Patient is fairly heavily fluid overloaded on exam, giving lasix 100mg  IV x1 to see if there is any effect, nephrology to see in AM, have ordered his home dose of lasix for tomorrow in the mean time.  No indications for emergent dialysis tonight.  CT reviewed, given the lack of fever or chills, no WBC, no other SIRS criteria, as well as the obvious findings of fluid overload on exam in the setting of a patient with GFR of <10 not currently taking any diuretic at home, PNA is lower on the  differential and fluid overload is felt to be the most likely cause of his cough at this point.  DM2 - patient takes 70/30 once a day at home, will continue this and monitor CBG checks AC/HS.  MGUS - patient with MGUS diagnosed in 2013    Code Status: Full Code  Family Communication: Family at bedside Disposition Plan: Admit to inpatient   Time spent: 70 min  GARDNER, JARED M. [facility] Pager [phone]  If 7AM-7PM,  please contact the day team taking care of the patient [email or web address] Password [email or web address] 01/04/2014, 9:15 PM

## 2014-01-04 NOTE — ED Notes (Signed)
Patient c/o right flank pain Patient told to come over from [place] office, Dr. Edward Qualia, after seeking care for sob and cold symptoms with productive cough.  Patient previously visit same physician in November for cold symptoms, but this episode has been going on for 3 weeks and last night patient was very sob and unable to sleep.  Shortness of breath at rest, worsens with exertion, swelling in ankles.  Denies chest pain. Patient states he didn't feel bad until after he took the flu shot.   Still urinating normally  Worsening kidney failure pneumonia

## 2014-01-04 NOTE — ED Notes (Signed)
Checked Pt CBG level was 95mg /dL

## 2014-01-05 ENCOUNTER — Inpatient Hospital Stay (HOSPITAL_COMMUNITY): Payer: Medicaid Other

## 2014-01-05 ENCOUNTER — Encounter (HOSPITAL_COMMUNITY): Payer: Self-pay | Admitting: *Deleted

## 2014-01-05 DIAGNOSIS — I10 Essential (primary) hypertension: Secondary | ICD-10-CM

## 2014-01-05 DIAGNOSIS — E785 Hyperlipidemia, unspecified: Secondary | ICD-10-CM

## 2014-01-05 DIAGNOSIS — E8779 Other fluid overload: Secondary | ICD-10-CM

## 2014-01-05 LAB — CBC
HCT: 29.1 % — ABNORMAL LOW (ref 39.0–52.0)
Hemoglobin: 8.9 g/dL — ABNORMAL LOW (ref 13.0–17.0)
MCH: 23.2 pg — AB (ref 26.0–34.0)
MCHC: 30.6 g/dL (ref 30.0–36.0)
MCV: 75.8 fL — AB (ref 78.0–100.0)
PLATELETS: 228 10*3/uL (ref 150–400)
RBC: 3.84 MIL/uL — ABNORMAL LOW (ref 4.22–5.81)
RDW: 15.9 % — AB (ref 11.5–15.5)
WBC: 7.6 10*3/uL (ref 4.0–10.5)

## 2014-01-05 LAB — BASIC METABOLIC PANEL
BUN: 73 mg/dL — ABNORMAL HIGH (ref 6–23)
CALCIUM: 8.5 mg/dL (ref 8.4–10.5)
CO2: 18 mEq/L — ABNORMAL LOW (ref 19–32)
CREATININE: 9.97 mg/dL — AB (ref 0.50–1.35)
Chloride: 103 mEq/L (ref 96–112)
GFR calc non Af Amer: 5 mL/min — ABNORMAL LOW (ref >90)
GFR, EST AFRICAN AMERICAN: 6 mL/min — AB (ref >90)
Glucose, Bld: 144 mg/dL — ABNORMAL HIGH (ref 70–99)
Potassium: 5.8 mEq/L — ABNORMAL HIGH (ref 3.7–5.3)
Sodium: 137 mEq/L (ref 137–147)

## 2014-01-05 LAB — GLUCOSE, CAPILLARY
GLUCOSE-CAPILLARY: 116 mg/dL — AB (ref 70–99)
Glucose-Capillary: 142 mg/dL — ABNORMAL HIGH (ref 70–99)
Glucose-Capillary: 139 mg/dL — ABNORMAL HIGH (ref 70–99)
Glucose-Capillary: 129 mg/dL — ABNORMAL HIGH (ref 70–99)

## 2014-01-05 MED ORDER — LEVOFLOXACIN 500 MG PO TABS
500.0000 mg | ORAL_TABLET | ORAL | Status: DC
  Administered 2014-01-09: 500 mg via ORAL
  Filled 2014-01-05 (×2): qty 1

## 2014-01-05 MED ORDER — PNEUMOCOCCAL VAC POLYVALENT 25 MCG/0.5ML IJ INJ
0.5000 mL | INJECTION | INTRAMUSCULAR | Status: AC | PRN
  Administered 2014-01-05: 0.5 mL via INTRAMUSCULAR
  Filled 2014-01-05: qty 0.5

## 2014-01-05 MED ORDER — LEVOFLOXACIN 750 MG PO TABS
750.0000 mg | ORAL_TABLET | Freq: Once | ORAL | Status: AC
  Administered 2014-01-05: 750 mg via ORAL
  Filled 2014-01-05: qty 1

## 2014-01-05 MED ORDER — FUROSEMIDE 40 MG PO TABS
40.0000 mg | ORAL_TABLET | Freq: Two times a day (BID) | ORAL | Status: DC
  Administered 2014-01-06 – 2014-01-08 (×5): 40 mg via ORAL
  Filled 2014-01-05 (×7): qty 1

## 2014-01-05 MED ORDER — KIDNEY FAILURE BOOK
Freq: Once | Status: AC
  Administered 2014-01-05: 19:00:00
  Filled 2014-01-05 (×2): qty 1

## 2014-01-05 MED ORDER — FUROSEMIDE 10 MG/ML IJ SOLN
100.0000 mg | Freq: Once | INTRAVENOUS | Status: AC
  Administered 2014-01-05: 100 mg via INTRAVENOUS
  Filled 2014-01-05: qty 10

## 2014-01-05 MED ORDER — BIOTENE DRY MOUTH MT LIQD
15.0000 mL | Freq: Two times a day (BID) | OROMUCOSAL | Status: DC
  Administered 2014-01-05 – 2014-01-09 (×7): 15 mL via OROMUCOSAL

## 2014-01-05 NOTE — Progress Notes (Addendum)
[facility] PROGRESS NOTE [name] L. [name], MD               Pager [phone] (if 7P to 7A, page night hospitalist on [email or web address] TYSDAL [id number] DOB: 02/13/1956 DOA: 01/04/2014 PCP: Gara Kroner, MD  Assessment/Plan: 1. CAP - upper lobe PNA on CT w/ LAD. Start on levaquin dosed per pharmacy.  2. AoCKD w/ volume overload - stage V CKD at this time w/ GFR < 10 - noncompliant w/ home lasix . Responded well to lasix 100mg  IV last night, will repeat dose this AM and await nephro recs. BMet daily. Check mag and phos. Daily weights  3. MGUS - appears he was diagnosed in 2013 and then was noncomplaint w/ follow up. Will check SPEP/UPEP today to further evaluate   Principal Problem:   Acute on chronic kidney failure Active Problems:   Diabetes mellitus   Monoclonal gammopathies   Code Status: full Family Communication: none  Disposition Plan: 3-5 days   Velna Hatchet, MD  Internal Medicine Pager [phone].  If 7PM-7AM, please contact night-coverage at [email or web address], password [email or web address] 01/05/2014, 8:03 AM   LOS: 1 day   Brief narrative: CKD patient seen by [facility] who failed compliance w/ their lasix and presents w/ cough. CT shows possible PNA and volume overload. Nephro consulted in ED, to see in AM   Consultants:  [facility]  Procedures:  None   Antibiotics:  levaquin 2/21 -->  ?  HPI/Subjective: No events overnight  Objective: Filed Vitals:   01/04/14 2130 01/04/14 2145 01/04/14 2227 01/05/14 0519  BP: 140/74 113/79 151/88 154/82  Pulse: 74 75 76 69  Temp:   97.1 F (36.2 C) 98.1 F (36.7 C)  TempSrc:   Oral Oral  Resp: 28 28 24 24   Height:   6\' 1"  (1.854 m)   Weight:   144.198 kg (317 lb 14.4 oz)   SpO2: 100% 100% 100% 95%    Intake/Output Summary (Last 24 hours) at 01/05/14 0803 Last data filed at 01/05/14 0522  Gross per 24 hour  Intake      0 ml  Output    925 ml  Net   -925 ml    Exam:   General:  [name] in NAD    HEENT: MMM, anicteric   Cardiovascular: RRR, no MRG   Respiratory: crackles at bases, good air movement throughout  Abd: ND, NT, normal BS  Ext: 2+ pitting edema to midway shin B/L     Data Reviewed: Basic Metabolic Panel:  Recent Labs Lab 01/04/14 1649 01/05/14 0358  NA 139 137  K 5.3 5.8*  CL 104 103  CO2 19 18*  GLUCOSE 149* 144*  BUN 69* 73*  CREATININE 9.41* 9.97*  CALCIUM 8.5 8.5   Liver Function Tests: No results found for this basename: AST, ALT, ALKPHOS, BILITOT, PROT, ALBUMIN,  in the last 168 hours No results found for this basename: LIPASE, AMYLASE,  in the last 168 hours No results found for this basename: AMMONIA,  in the last 168 hours CBC:  Recent Labs Lab 01/04/14 1649 01/05/14 0358  WBC 5.9 7.6  HGB 8.9* 8.9*  HCT 29.4* 29.1*  MCV 75.8* 75.8*  PLT 225 228   Cardiac Enzymes: No results found for this basename: CKTOTAL, CKMB, CKMBINDEX, TROPONINI,  in the last 168 hours BNP: No components found with this basename: POCBNP,  CBG:  Recent Labs Lab 01/04/14 2041 01/04/14 2235 01/05/14 0726  GLUCAP 95 128* 142*  No results found for this or any previous visit (from the past 240 hour(s)).   Studies: Dg Chest 2 View  01/04/2014   CLINICAL DATA:  Right-sided chest pain with chest congestion and shortness of breath.  EXAM: CHEST  2 VIEW  COMPARISON:  01/04/2014 and 01/10/2012 and 04/02/2008  FINDINGS: There is cardiomegaly. Pulmonary vascularity appears normal. There is a faint right perihilar infiltrate with fullness of the right hilum. There are no effusions. Left lung is clear. No acute osseous abnormality.  IMPRESSION: Right perihilar infiltrate with fullness in the right hilum and in the azygos region. This could represent pneumonia with adenopathy but the possibility of adenopathy and a mass with postobstructive infiltrate should be considered. Follow-up is recommended.   Electronically Signed   By: Rozetta Nunnery M.D.   On: 01/04/2014  19:10   Dg Chest 2 View  01/04/2014   CLINICAL DATA:  Bronchitis. Shortness of breath. History of congestive heart failure.  EXAM: CHEST  2 VIEW  COMPARISON:  01/10/2012.  04/02/2008.  FINDINGS: Cardiac silhouette is enlarged. The aorta is unfolded. There is extensive overlying soft tissue density. I do not think there is any dense consolidation, collapse or a fusion. Difficulty completely rule out a patchy infiltrate in the right lung, though I favor this is overlying soft tissue. The right hilum is prominent, more so than seen on some of the older studies. Because of the question of right hilar enlargement and right pulmonary parenchymal density, I would suggest that a chest CT to ensure that we are not missing neoplastic disease. No effusions. No significant bony finding.  IMPRESSION: Cardiac enlargement.  Unfolded aorta up.  Question right pulmonary parenchymal density in the lower lung with enlarged right hilum. Whereas this could be due to inflammatory disease, consider chest CT to rule out neoplastic disease.   Electronically Signed   By: Nelson Chimes M.D.   On: 01/04/2014 11:47   Ct Chest Wo Contrast  01/04/2014   CLINICAL DATA:  Short of breath.  Right flank lower ribcage pain.  EXAM: CT CHEST WITHOUT CONTRAST  TECHNIQUE: Multidetector CT imaging of the chest was performed following the standard protocol without IV contrast.  COMPARISON:  Chest radiograph 01/04/2014  FINDINGS: No pleural effusion identified. Peripheral and basilar predominant interstitial reticulation is noted. Right upper lobe peribronchovascular densities are identified, image 24/series 3. A few scattered peribronchovascular densities are noted in the central right lower lobe. Right apical nodule is identified measuring 6 mm, image 10/series 3.  The heart size is mild to moderately enlarged. There is no pericardial effusion. Prominent mediastinal lymph nodes are identified. Left paratracheal lymph node measures 1.6 cm, image  19/series 2. Right paratracheal lymph node is equal to 1.2 cm, image 20/series 2. The sub- carinal lymph node measures 1.6 cm, image 28/series 2.  There is no axillary or supraclavicular adenopathy. Incidental imaging through the upper abdomen is unremarkable.  Review of the visualized bony structures demonstrates no aggressive lytic or sclerotic bone lesions.  IMPRESSION: 1. Right upper lobe peribronchovascular densities are favored to represent early pneumonia. Findings may account for the patient's right sided chest pain. 2. Right apical nodule measures 6 mm. If the patient is at high risk for bronchogenic carcinoma, follow-up chest CT at 6-12 months is recommended. If the patient is at low risk for bronchogenic carcinoma, follow-up chest CT at 12 months is recommended. This recommendation follows the consensus statement: Guidelines for Management of Small Pulmonary Nodules Detected on CT Scans: A Statement from  the [employer] as published in Radiology 2005;237:395-400. 3. Enlarged mediastinal lymph nodes are nonspecific. In the setting of infection it is conceivable that these could be reactive in etiology. Differential considerations include metastatic adenopathy or lymphoproliferative disorder.   Electronically Signed   By: Kerby Moors M.D.   On: 01/04/2014 20:27    Scheduled Meds: . antiseptic oral rinse  15 mL Mouth Rinse BID  . aspirin  325 mg Oral Daily  . carvedilol  6.25 mg Oral BID WC  . furosemide  100 mg Intravenous Once  . [START ON 01/06/2014] furosemide  40 mg Oral BID  . heparin  5,000 Units Subcutaneous 3 times per day  . insulin aspart protamine- aspart  12 Units Subcutaneous Q breakfast   Continuous Infusions:    Time Spent: 30 minutes

## 2014-01-05 NOTE — Consult Note (Signed)
Reason for Consult:AKI/CKD vs progressive CKD Referring Physician: [name], MD  [name] is an 58 y.o. male.  HPI: Pt is a 58 yo M with PMH sig for DM, HTN, MGUS, Etoh abuse, tobacco abuse, medical noncompliance, and CKD who was admitted with increasing SOB and lower ext edema.  He admits to nonadherence with diuretics for the last month and a half but has been taking KCl supplements and eating bananas.  He also has not followed up with Dr. Moshe Cipro (for CKD) nor with Dr. Lamonte Sakai (for MGUS) in 2 years.  He has not had labs in months and was not aware of his most recent Scr.  He denies any NSAIDs/Cox-II I's, N/V/anorexia/dysuria/pyuria/hematuria.  Does have a cousin on HD Mikel Cella).  Trend in Creatinine:  Creatinine, Ser  Date/Time Value Ref Range Status  01/05/2014  3:58 AM 9.97* 0.50 - 1.35 mg/dL Final  01/04/2014  4:49 PM 9.41* 0.50 - 1.35 mg/dL Final  03/07/2012  8:45 AM 2.55* 0.50 - 1.35 mg/dL Final  03/02/2012 10:51 AM 2.93* 0.50 - 1.35 mg/dL Final  01/13/2012  4:50 AM 3.29* 0.50 - 1.35 mg/dL Final  01/12/2012  4:43 AM 3.16* 0.50 - 1.35 mg/dL Final  01/11/2012  4:39 AM 2.73* 0.50 - 1.35 mg/dL Final  01/10/2012  6:45 PM 2.68* 0.50 - 1.35 mg/dL Final  01/10/2012  2:38 PM 2.62* 0.50 - 1.35 mg/dL Final    PMH:   Past Medical History  Diagnosis Date  . Diabetes mellitus   . Hypertension   . Hyperlipidemia   . Gout   . Alcohol abuse   . Tobacco abuse   . Renal insufficiency   . Congestive heart disease 12/2011  . Hyperlipidemia   . Monoclonal gammopathies 02/25/2012  . Pedal edema     PSH:   Past Surgical History  Procedure Laterality Date  . Left knee arthroscopy with generalized joint synovectomy,  01/2006  . Ganglion cyst removal x 2      Allergies:  Allergies  Allergen Reactions  . Penicillins Nausea And Vomiting and Other (See Comments)    Stomach pains also  . Vioxx [Rofecoxib] Nausea Only and Other (See Comments)    Gi upset.    Medications:   Prior  to Admission medications   Medication Sig Start Date End Date Taking? Authorizing Provider  allopurinol (ZYLOPRIM) 100 MG tablet Take 100 mg by mouth daily.   Yes Historical Provider, MD  aspirin 325 MG tablet Take 325 mg by mouth daily.   Yes Historical Provider, MD  carvedilol (COREG) 6.25 MG tablet Take 1 tablet (6.25 mg total) by mouth 2 (two) times daily with a meal. 01/13/12 01/04/14 Yes Lezlie Octave Black, NP  insulin aspart protamine-insulin aspart (NOVOLOG 70/30) (70-30) 100 UNIT/ML injection Inject 12 Units into the skin daily with breakfast.    Yes Historical Provider, MD  OVER THE COUNTER MEDICATION Apply 1 application topically daily as needed (for muscle pains). "flex all" spray   Yes Historical Provider, MD  potassium chloride SA (K-DUR,KLOR-CON) 20 MEQ tablet Take 1 tablet (20 mEq total) by mouth 2 (two) times daily. 01/13/12 01/04/14 Yes Radene Gunning, NP  Pseudoeph-Doxylamine-DM-APAP 30-6.25-15-325 MG CAPS Take 2 capsules by mouth at bedtime as needed (for cold).   Yes Historical Provider, MD    Inpatient medications: . antiseptic oral rinse  15 mL Mouth Rinse BID  . aspirin  325 mg Oral Daily  . carvedilol  6.25 mg Oral BID WC  . furosemide  100 mg  Intravenous Once  . [START ON 01/06/2014] furosemide  40 mg Oral BID  . heparin  5,000 Units Subcutaneous 3 times per day  . insulin aspart protamine- aspart  12 Units Subcutaneous Q breakfast  . [START ON 01/07/2014] levofloxacin  500 mg Oral Q48H  . levofloxacin  750 mg Oral Once    Discontinued Meds:   Medications Discontinued During This Encounter  Medication Reason  . acetaminophen (TYLENOL) 500 MG tablet Patient has not taken in last 30 days  . colchicine 0.6 MG tablet Patient has not taken in last 30 days  . furosemide (LASIX) 40 MG tablet Patient has not taken in last 30 days  . insulin aspart (NOVOLOG) 100 UNIT/ML injection Inpatient Standard  . pravastatin (PRAVACHOL) 40 MG tablet Patient has not taken in last 30 days  .  amLODipine (NORVASC) 5 MG tablet Patient has not taken in last 30 days  . azithromycin (ZITHROMAX) 500 mg in dextrose 5 % 250 mL IVPB   . cefTRIAXone (ROCEPHIN) 1 g in dextrose 5 % 50 mL IVPB   . furosemide (LASIX) 160 mg in dextrose 5 % 50 mL IVPB   . furosemide (LASIX) injection 80 mg   . insulin aspart (novoLOG) injection 0-9 Units   . furosemide (LASIX) tablet 40 mg     Social History:  reports that he has been smoking Cigarettes.  He has a 9.9 pack-year smoking history. He has never used smokeless tobacco. He reports that he drinks about 21.0 ounces of alcohol per week. He reports that he does not use illicit drugs.  Family History:   Family History  Problem Relation Age of Onset  . Lung cancer Father   . Diabetes Mother   . Heart disease Mother     A comprehensive review of systems was negative except for: Respiratory: positive for dyspnea on exertion Cardiovascular: positive for lower extremity edema Weight change:   Intake/Output Summary (Last 24 hours) at 01/05/14 1005 Last data filed at 01/05/14 1004  Gross per 24 hour  Intake    240 ml  Output    925 ml  Net   -685 ml   BP 146/79  Pulse 78  Temp(Src) 99 F (37.2 C) (Oral)  Resp 23  Ht 6\' 1"  (1.854 m)  Wt 144.198 kg (317 lb 14.4 oz)  BMI 41.95 kg/m2  SpO2 99% Filed Vitals:   01/04/14 2145 01/04/14 2227 01/05/14 0519 01/05/14 0943  BP: 113/79 151/88 154/82 146/79  Pulse: 75 76 69 78  Temp:  97.1 F (36.2 C) 98.1 F (36.7 C) 99 F (37.2 C)  TempSrc:  Oral Oral Oral  Resp: 28 24 24 23   Height:  6\' 1"  (1.854 m)    Weight:  144.198 kg (317 lb 14.4 oz)    SpO2: 100% 100% 95% 99%     General appearance: alert, cooperative and no distress Head: Normocephalic, without obvious abnormality, atraumatic Neck: no adenopathy, no carotid bruit, supple, symmetrical, trachea midline and thyroid not enlarged, symmetric, no tenderness/mass/nodules Resp: diminished breath sounds bibasilar Cardio: faint HS, no precordial  rub appreciated GI: obese, distended, +BS, soft, NT Extremities: edema tr pretib edema Neurologic: Grossly normal  Labs: Basic Metabolic Panel:  Recent Labs Lab 01/04/14 1649 01/05/14 0358  NA 139 137  K 5.3 5.8*  CL 104 103  CO2 19 18*  GLUCOSE 149* 144*  BUN 69* 73*  CREATININE 9.41* 9.97*  CALCIUM 8.5 8.5   Liver Function Tests: No results found for this basename: AST,  ALT, ALKPHOS, BILITOT, PROT, ALBUMIN,  in the last 168 hours No results found for this basename: LIPASE, AMYLASE,  in the last 168 hours No results found for this basename: AMMONIA,  in the last 168 hours CBC:  Recent Labs Lab 01/04/14 1649 01/05/14 0358  WBC 5.9 7.6  HGB 8.9* 8.9*  HCT 29.4* 29.1*  MCV 75.8* 75.8*  PLT 225 228   PT/INR: @LABRCNTIP (inr:5) Cardiac Enzymes: )No results found for this basename: CKTOTAL, CKMB, CKMBINDEX, TROPONINI,  in the last 168 hours CBG:  Recent Labs Lab 01/04/14 2041 01/04/14 2235 01/05/14 0726  GLUCAP 95 128* 142*    Iron Studies: No results found for this basename: IRON, TIBC, TRANSFERRIN, FERRITIN,  in the last 168 hours  Xrays/Other Studies: Dg Chest 2 View  01/04/2014   CLINICAL DATA:  Right-sided chest pain with chest congestion and shortness of breath.  EXAM: CHEST  2 VIEW  COMPARISON:  01/04/2014 and 01/10/2012 and 04/02/2008  FINDINGS: There is cardiomegaly. Pulmonary vascularity appears normal. There is a faint right perihilar infiltrate with fullness of the right hilum. There are no effusions. Left lung is clear. No acute osseous abnormality.  IMPRESSION: Right perihilar infiltrate with fullness in the right hilum and in the azygos region. This could represent pneumonia with adenopathy but the possibility of adenopathy and a mass with postobstructive infiltrate should be considered. Follow-up is recommended.   Electronically Signed   By: Rozetta Nunnery M.D.   On: 01/04/2014 19:10   Dg Chest 2 View  01/04/2014   CLINICAL DATA:  Bronchitis.  Shortness of breath. History of congestive heart failure.  EXAM: CHEST  2 VIEW  COMPARISON:  01/10/2012.  04/02/2008.  FINDINGS: Cardiac silhouette is enlarged. The aorta is unfolded. There is extensive overlying soft tissue density. I do not think there is any dense consolidation, collapse or a fusion. Difficulty completely rule out a patchy infiltrate in the right lung, though I favor this is overlying soft tissue. The right hilum is prominent, more so than seen on some of the older studies. Because of the question of right hilar enlargement and right pulmonary parenchymal density, I would suggest that a chest CT to ensure that we are not missing neoplastic disease. No effusions. No significant bony finding.  IMPRESSION: Cardiac enlargement.  Unfolded aorta up.  Question right pulmonary parenchymal density in the lower lung with enlarged right hilum. Whereas this could be due to inflammatory disease, consider chest CT to rule out neoplastic disease.   Electronically Signed   By: Nelson Chimes M.D.   On: 01/04/2014 11:47   Ct Chest Wo Contrast  01/04/2014   CLINICAL DATA:  Short of breath.  Right flank lower ribcage pain.  EXAM: CT CHEST WITHOUT CONTRAST  TECHNIQUE: Multidetector CT imaging of the chest was performed following the standard protocol without IV contrast.  COMPARISON:  Chest radiograph 01/04/2014  FINDINGS: No pleural effusion identified. Peripheral and basilar predominant interstitial reticulation is noted. Right upper lobe peribronchovascular densities are identified, image 24/series 3. A few scattered peribronchovascular densities are noted in the central right lower lobe. Right apical nodule is identified measuring 6 mm, image 10/series 3.  The heart size is mild to moderately enlarged. There is no pericardial effusion. Prominent mediastinal lymph nodes are identified. Left paratracheal lymph node measures 1.6 cm, image 19/series 2. Right paratracheal lymph node is equal to 1.2 cm, image  20/series 2. The sub- carinal lymph node measures 1.6 cm, image 28/series 2.  There is no axillary or  supraclavicular adenopathy. Incidental imaging through the upper abdomen is unremarkable.  Review of the visualized bony structures demonstrates no aggressive lytic or sclerotic bone lesions.  IMPRESSION: 1. Right upper lobe peribronchovascular densities are favored to represent early pneumonia. Findings may account for the patient's right sided chest pain. 2. Right apical nodule measures 6 mm. If the patient is at high risk for bronchogenic carcinoma, follow-up chest CT at 6-12 months is recommended. If the patient is at low risk for bronchogenic carcinoma, follow-up chest CT at 12 months is recommended. This recommendation follows the consensus statement: Guidelines for Management of Small Pulmonary Nodules Detected on CT Scans: A Statement from the [employer] [place] as published in Radiology 2005;237:395-400. 3. Enlarged mediastinal lymph nodes are nonspecific. In the setting of infection it is conceivable that these could be reactive in etiology. Differential considerations include metastatic adenopathy or lymphoproliferative disorder.   Electronically Signed   By: Kerby Moors M.D.   On: 01/04/2014 20:27     Assessment/Plan: 1. AKI/CKD (possibly related to conversion of MGUS to MM) vs. Progressive CKD now at ESRD due to poorly controlled DM and HTN.   1. Responding to IV lasix 2. Will need to have vein mapping and permanent access placed and likely tunneled HD cath as well for HD if renal function continues to deteriorate 3. Educate on RRT 4. Renal US eval size and # of kidneys (small shrunken will support progressive CKD although nephromegaly could support amyloidosis) 5. Stress importance of compliance and follow up 2. Hyperkalemia- cont to treat medically with lasix and follow 3. Metabolic acidosis- as above, should respond to lasix, but may need bicarb  4. MGUS- will recheck SPEP/UPEP which  has not been done in 2 years 5. HTN- variable.  Would not add ACE/ARB given advanced CKD and hyperkalemia. 6. Anemia- microcytic.  Check iron stores as well as SPEP/UPEP as above 7. RUL densities on CT scan- worrisome for bronchogenic carcinoma in pt with +tob history and Etoh abuse.  F/u CT in 6 months per Radiology 8. MBD- will check ca, phos, iPTH but will likely require vitamin D 9.    Vidit Boissonneault A 01/05/2014, 10:05 AM

## 2014-01-05 NOTE — Progress Notes (Signed)
ANTIBIOTIC CONSULT NOTE - INITIAL  Pharmacy Consult for Levofloxacin Indication: CAP  Allergies  Allergen Reactions  . Penicillins Nausea And Vomiting and Other (See Comments)    Stomach pains also  . Vioxx [Rofecoxib] Nausea Only and Other (See Comments)    Gi upset.    Patient Measurements: Height: 6\' 1"  (185.4 cm) Weight: 317 lb 14.4 oz (144.198 kg) IBW/kg (Calculated) : 79.9  Vital Signs: Temp: 98.1 F (36.7 C) (02/21 0519) Temp src: Oral (02/21 0519) BP: 154/82 mmHg (02/21 0519) Pulse Rate: 69 (02/21 0519) Intake/Output from previous day: 02/20 0701 - 02/21 0700 In: -  Out: 925 [Urine:925] Intake/Output from this shift:    Labs:  Recent Labs  01/04/14 1649 01/05/14 0358  WBC 5.9 7.6  HGB 8.9* 8.9*  PLT 225 228  CREATININE 9.41* 9.97*   Estimated Creatinine Clearance: 12.4 ml/min (by C-G formula based on Cr of 9.97). No results found for this basename: VANCOTROUGH, VANCOPEAK, VANCORANDOM, GENTTROUGH, GENTPEAK, GENTRANDOM, TOBRATROUGH, TOBRAPEAK, TOBRARND, AMIKACINPEAK, AMIKACINTROU, AMIKACIN,  in the last 72 hours   Microbiology: No results found for this or any previous visit (from the past 720 hour(s)).  Medical History: Past Medical History  Diagnosis Date  . Diabetes mellitus   . Hypertension   . Hyperlipidemia   . Gout   . Alcohol abuse   . Tobacco abuse   . Renal insufficiency   . Congestive heart disease 12/2011  . Hyperlipidemia   . Monoclonal gammopathies 02/25/2012  . Pedal edema    Assessment: 58 yo male admitted on 2/20 with 3 day history of worsening cough, productive sputum, SOB, and worsening peripheral edema. Pharmacy has been consulted to dose levofloxacin for CAP. CT showed upper lobe PNA. WBC wnl, afebrile. Patient has acute on chronic kidney disease. Scr 9.97 and CrCl ~ 12 mL/min.   Goal of Therapy:  Eradication of infection  Plan:  - Start levofloxacin PO 750mg  x 1, then 500mg  q48h - Monitor renal function, CX, WBC, and  temp. - Follow-up need for HD  [name] A. Pincus Badder, PharmD Clinical Pharmacist - Resident Pager: [phone] Pharmacy: [phone] 01/05/2014 8:46 AM    [name] A 01/05/2014,8:42 AM

## 2014-01-06 DIAGNOSIS — N184 Chronic kidney disease, stage 4 (severe): Secondary | ICD-10-CM

## 2014-01-06 DIAGNOSIS — J189 Pneumonia, unspecified organism: Secondary | ICD-10-CM

## 2014-01-06 LAB — BASIC METABOLIC PANEL
BUN: 77 mg/dL — AB (ref 6–23)
CALCIUM: 8.6 mg/dL (ref 8.4–10.5)
CHLORIDE: 100 meq/L (ref 96–112)
CO2: 21 mEq/L (ref 19–32)
Creatinine, Ser: 10.8 mg/dL — ABNORMAL HIGH (ref 0.50–1.35)
GFR calc Af Amer: 5 mL/min — ABNORMAL LOW (ref >90)
GFR calc non Af Amer: 5 mL/min — ABNORMAL LOW (ref >90)
Glucose, Bld: 160 mg/dL — ABNORMAL HIGH (ref 70–99)
Potassium: 5.5 mEq/L — ABNORMAL HIGH (ref 3.7–5.3)
Sodium: 138 mEq/L (ref 137–147)

## 2014-01-06 LAB — GLUCOSE, CAPILLARY
GLUCOSE-CAPILLARY: 118 mg/dL — AB (ref 70–99)
Glucose-Capillary: 146 mg/dL — ABNORMAL HIGH (ref 70–99)
Glucose-Capillary: 114 mg/dL — ABNORMAL HIGH (ref 70–99)
Glucose-Capillary: 110 mg/dL — ABNORMAL HIGH (ref 70–99)

## 2014-01-06 LAB — FERRITIN: FERRITIN: 98 ng/mL (ref 22–322)

## 2014-01-06 LAB — FOLATE RBC: RBC Folate: 821 ng/mL — ABNORMAL HIGH (ref >280)

## 2014-01-06 LAB — IRON AND TIBC
IRON: 30 ug/dL — AB (ref 42–135)
SATURATION RATIOS: 13 % — AB (ref 20–55)
TIBC: 240 ug/dL (ref 215–435)
UIBC: 210 ug/dL (ref 125–400)

## 2014-01-06 LAB — VITAMIN B12: VITAMIN B 12: 468 pg/mL (ref 211–911)

## 2014-01-06 MED ORDER — CEFUROXIME SODIUM 1.5 G IJ SOLR
1.5000 g | INTRAMUSCULAR | Status: AC
  Filled 2014-01-06: qty 1.5

## 2014-01-06 MED ORDER — SODIUM POLYSTYRENE SULFONATE 15 GM/60ML PO SUSP
30.0000 g | Freq: Once | ORAL | Status: AC
  Administered 2014-01-06: 30 g via ORAL
  Filled 2014-01-06: qty 120

## 2014-01-06 NOTE — Consult Note (Signed)
[facility] HISTORY AND PHYSICAL  Reason for consult: hemodialysis access Requesting: [place] History of Present Illness:  Patient is a 58 y.o. year old male who presents for placement of a permanent hemodialysis access. The patient is left handed.  The patient is not currently on hemodialysis.  The cause of renal failure is thought to be secondary to hypertension and diabetes.  Other chronic medical problems include hyperlipidemia obesity, CHF.  Past Medical History  Diagnosis Date  . Diabetes mellitus   . Hypertension   . Hyperlipidemia   . Gout   . Alcohol abuse   . Tobacco abuse   . Renal insufficiency   . Congestive heart disease 12/2011  . Hyperlipidemia   . Monoclonal gammopathies 02/25/2012  . Pedal edema     Past Surgical History  Procedure Laterality Date  . Left knee arthroscopy with generalized joint synovectomy,  01/2006  . Ganglion cyst removal x 2       Social History History  Substance Use Topics  . Smoking status: Current Every Day Smoker -- 0.30 packs/day for 33 years    Types: Cigarettes  . Smokeless tobacco: Never Used  . Alcohol Use: 21.0 oz/week    42 drink(s) per week    Family History Family History  Problem Relation Age of Onset  . Lung cancer Father   . Diabetes Mother   . Heart disease Mother     Allergies  Allergies  Allergen Reactions  . Penicillins Nausea And Vomiting and Other (See Comments)    Stomach pains also  . Vioxx [Rofecoxib] Nausea Only and Other (See Comments)    Gi upset.     Current Facility-Administered Medications  Medication Dose Route Frequency Provider Last Rate Last Dose  . antiseptic oral rinse (BIOTENE) solution 15 mL  15 mL Mouth Rinse BID Etta Quill, DO   15 mL at 01/06/14 0800  . aspirin tablet 325 mg  325 mg Oral Daily Etta Quill, DO   325 mg at 01/06/14 [id number]  . carvedilol (COREG) tablet 6.25 mg  6.25 mg Oral BID WC Etta Quill, DO   6.25 mg at 01/06/14 [id number]   . [START ON 01/07/2014] cefUROXime (ZINACEF) 1.5 g in dextrose 5 % 50 mL IVPB  1.5 g Intravenous On Call to [place], MD      . furosemide (LASIX) tablet 40 mg  40 mg Oral BID Velna Hatchet, MD   40 mg at 01/06/14 [id number]  . heparin injection 5,000 Units  5,000 Units Subcutaneous 3 times per day Etta Quill, DO   5,000 Units at 01/06/14 1411  . insulin aspart protamine- aspart (NOVOLOG MIX 70/30) injection 12 Units  12 Units Subcutaneous Q breakfast Etta Quill, DO   12 Units at 01/06/14 0800  . [START ON 01/07/2014] levofloxacin (LEVAQUIN) tablet 500 mg  500 mg Oral Q48H Ace Gins, RPH      . oxyCODONE (Oxy IR/ROXICODONE) immediate release tablet 5 mg  5 mg Oral Q4H PRN Ritta Slot, NP   5 mg at 01/06/14 0422    ROS:   General:  No weight loss, Fever, chills  HEENT: No recent headaches, no nasal bleeding, no visual changes, no sore throat  Neurologic: No dizziness, blackouts, seizures. No recent symptoms of stroke or mini- stroke. No recent episodes of slurred speech, or temporary blindness.  Cardiac: No recent episodes of chest pain/pressure, no shortness of breath at rest.  No shortness of  breath with exertion.  Denies history of atrial fibrillation or irregular heartbeat  Vascular: No history of rest pain in feet.  No history of claudication.  No history of non-healing ulcer, No history of DVT   Pulmonary: No home oxygen, no productive cough, no hemoptysis,  No asthma or wheezing  Musculoskeletal:  [ ]  Arthritis, [ ]  Low back pain,  [ ]  Joint pain  Hematologic:No history of hypercoagulable state.  No history of easy bleeding.  No history of anemia  Gastrointestinal: No hematochezia or melena,  No gastroesophageal reflux, no trouble swallowing  Urinary: [x ] chronic Kidney disease, [ ]  on HD - [ ]  MWF or [ ]  TTHS, [ ]  Burning with urination, [ ]  Frequent urination, [ ]  Difficulty urinating;   Skin: No rashes  Psychological: No history of anxiety,  No history  of depression   Physical Examination  Filed Vitals:   01/06/14 0500 01/06/14 0531 01/06/14 0900 01/06/14 1406  BP:  129/78 142/68 133/62  Pulse:  72 70 67  Temp:  97.6 F (36.4 C) 97.8 F (36.6 C) 98.7 F (37.1 C)  TempSrc:  Oral Oral Oral  Resp:  28 23 22   Height:      Weight: 319 lb 7.1 oz (144.9 kg)  315 lb 14.4 oz (143.291 kg)   SpO2:  99% 100% 99%    Body mass index is 41.69 kg/(m^2).  General:  Alert and oriented, no acute distress HEENT: Normal Neck: No bruit or JVD Pulmonary: Clear to auscultation bilaterally Cardiac: Regular Rate and Rhythm without murmur Gastrointestinal: Soft, non-tender, non-distended, no mass, no scars Skin: No rash Extremity Pulses:  2+ radial, brachial pulses bilaterally Musculoskeletal: No deformity or edema  Neurologic: Upper and lower extremity motor 5/5 and symmetric  DATA: Vein map pending  CBC    Component Value Date/Time   WBC 7.6 01/05/2014 0358   WBC 5.5 03/02/2012 1051   RBC 3.84* 01/05/2014 0358   RBC 5.77 03/02/2012 1051   HGB 8.9* 01/05/2014 0358   HGB 13.5 03/02/2012 1051   HCT 29.1* 01/05/2014 0358   HCT 43.2 03/02/2012 1051   PLT 228 01/05/2014 0358   PLT 206 03/02/2012 1051   MCV 75.8* 01/05/2014 0358   MCV 74.9* 03/02/2012 1051   MCH 23.2* 01/05/2014 0358   MCH 23.5* 03/02/2012 1051   MCHC 30.6 01/05/2014 0358   MCHC 31.3* 03/02/2012 1051   RDW 15.9* 01/05/2014 0358   RDW 13.7 03/02/2012 1051   LYMPHSABS 1.5 03/02/2012 1051   LYMPHSABS 1.4 01/10/2012 1438   MONOABS 0.5 03/02/2012 1051   MONOABS 1.0 01/10/2012 1438   EOSABS 0.1 03/02/2012 1051   EOSABS 0.0 01/10/2012 1438   BASOSABS 0.0 03/02/2012 1051   BASOSABS 0.1 01/10/2012 1438    BMET    Component Value Date/Time   NA 138 01/06/2014 0845   K 5.5* 01/06/2014 0845   CL 100 01/06/2014 0845   CO2 21 01/06/2014 0845   GLUCOSE 160* 01/06/2014 0845   BUN 77* 01/06/2014 0845   CREATININE 10.80* 01/06/2014 0845   CREATININE 2.55* 03/07/2012 0845   CALCIUM 8.6 01/06/2014 0845    GFRNONAA 5* 01/06/2014 0845   GFRAA 5* 01/06/2014 0845       ASSESSMENT: CKD 5 starting dialysis.  Pt needs vein map to decide on permanent access plan.  He is left hand dominant so will try to use right arm   PLAN:  Diatek catheter tomorrow Dr Bridgett Larsson.  Permanent access later this week. NPO p  midnight, consent.  Ruta Hinds, MD [facility] [place] Office: [phone] Pager: [phone]

## 2014-01-06 NOTE — Progress Notes (Signed)
Patient ID: [name], male   DOB: 1956/06/03, 58 y.o.   MRN: [id number] S:no new complaints O:BP 142/68  Pulse 70  Temp(Src) 97.8 F (36.6 C) (Oral)  Resp 23  Ht 6\' 1"  (1.854 m)  Wt 143.291 kg (315 lb 14.4 oz)  BMI 41.69 kg/m2  SpO2 100%  Intake/Output Summary (Last 24 hours) at 01/06/14 1102 Last data filed at 01/06/14 0948  Gross per 24 hour  Intake   1080 ml  Output   1300 ml  Net   -220 ml   Intake/Output: I/O last 3 completed shifts: In: 1080 [P.O.:1080] Out: 2075 [Urine:2075]  Intake/Output this shift:  Total I/O In: 240 [P.O.:240] Out: 150 [Urine:150] Weight change: -4.787 kg (-10 lb 8.9 oz) Gen:WD [name] in NAD CVS:no rub Resp:cta [id number], +BS, soft Ext:tr edema   Recent Labs Lab 01/04/14 1649 01/05/14 0358 01/06/14 0845  NA 139 137 138  K 5.3 5.8* 5.5*  CL 104 103 100  CO2 19 18* 21  GLUCOSE 149* 144* 160*  BUN 69* 73* 77*  CREATININE 9.41* 9.97* 10.80*  CALCIUM 8.5 8.5 8.6   Liver Function Tests: No results found for this basename: AST, ALT, ALKPHOS, BILITOT, PROT, ALBUMIN,  in the last 168 hours No results found for this basename: LIPASE, AMYLASE,  in the last 168 hours No results found for this basename: AMMONIA,  in the last 168 hours CBC:  Recent Labs Lab 01/04/14 1649 01/05/14 0358  WBC 5.9 7.6  HGB 8.9* 8.9*  HCT 29.4* 29.1*  MCV 75.8* 75.8*  PLT 225 228   Cardiac Enzymes: No results found for this basename: CKTOTAL, CKMB, CKMBINDEX, TROPONINI,  in the last 168 hours CBG:  Recent Labs Lab 01/05/14 0726 01/05/14 1200 01/05/14 1722 01/05/14 2122 01/06/14 0811  GLUCAP 142* 139* 116* 129* 146*    Iron Studies: No results found for this basename: IRON, TIBC, TRANSFERRIN, FERRITIN,  in the last 72 hours Studies/Results: Dg Chest 2 View  01/04/2014   CLINICAL DATA:  Right-sided chest pain with chest congestion and shortness of breath.  EXAM: CHEST  2 VIEW  COMPARISON:  01/04/2014 and 01/10/2012 and 04/02/2008   FINDINGS: There is cardiomegaly. Pulmonary vascularity appears normal. There is a faint right perihilar infiltrate with fullness of the right hilum. There are no effusions. Left lung is clear. No acute osseous abnormality.  IMPRESSION: Right perihilar infiltrate with fullness in the right hilum and in the azygos region. This could represent pneumonia with adenopathy but the possibility of adenopathy and a mass with postobstructive infiltrate should be considered. Follow-up is recommended.   Electronically Signed   By: Rozetta Nunnery M.D.   On: 01/04/2014 19:10   Dg Chest 2 View  01/04/2014   CLINICAL DATA:  Bronchitis. Shortness of breath. History of congestive heart failure.  EXAM: CHEST  2 VIEW  COMPARISON:  01/10/2012.  04/02/2008.  FINDINGS: Cardiac silhouette is enlarged. The aorta is unfolded. There is extensive overlying soft tissue density. I do not think there is any dense consolidation, collapse or a fusion. Difficulty completely rule out a patchy infiltrate in the right lung, though I favor this is overlying soft tissue. The right hilum is prominent, more so than seen on some of the older studies. Because of the question of right hilar enlargement and right pulmonary parenchymal density, I would suggest that a chest CT to ensure that we are not missing neoplastic disease. No effusions. No significant bony finding.  IMPRESSION: Cardiac enlargement.  Unfolded aorta  up.  Question right pulmonary parenchymal density in the lower lung with enlarged right hilum. Whereas this could be due to inflammatory disease, consider chest CT to rule out neoplastic disease.   Electronically Signed   By: Nelson Chimes M.D.   On: 01/04/2014 11:47   Ct Chest Wo Contrast  01/04/2014   CLINICAL DATA:  Short of breath.  Right flank lower ribcage pain.  EXAM: CT CHEST WITHOUT CONTRAST  TECHNIQUE: Multidetector CT imaging of the chest was performed following the standard protocol without IV contrast.  COMPARISON:  Chest  radiograph 01/04/2014  FINDINGS: No pleural effusion identified. Peripheral and basilar predominant interstitial reticulation is noted. Right upper lobe peribronchovascular densities are identified, image 24/series 3. A few scattered peribronchovascular densities are noted in the central right lower lobe. Right apical nodule is identified measuring 6 mm, image 10/series 3.  The heart size is mild to moderately enlarged. There is no pericardial effusion. Prominent mediastinal lymph nodes are identified. Left paratracheal lymph node measures 1.6 cm, image 19/series 2. Right paratracheal lymph node is equal to 1.2 cm, image 20/series 2. The sub- carinal lymph node measures 1.6 cm, image 28/series 2.  There is no axillary or supraclavicular adenopathy. Incidental imaging through the upper abdomen is unremarkable.  Review of the visualized bony structures demonstrates no aggressive lytic or sclerotic bone lesions.  IMPRESSION: 1. Right upper lobe peribronchovascular densities are favored to represent early pneumonia. Findings may account for the patient's right sided chest pain. 2. Right apical nodule measures 6 mm. If the patient is at high risk for bronchogenic carcinoma, follow-up chest CT at 6-12 months is recommended. If the patient is at low risk for bronchogenic carcinoma, follow-up chest CT at 12 months is recommended. This recommendation follows the consensus statement: Guidelines for Management of Small Pulmonary Nodules Detected on CT Scans: A Statement from the [place] as published in Radiology 2005;237:395-400. 3. Enlarged mediastinal lymph nodes are nonspecific. In the setting of infection it is conceivable that these could be reactive in etiology. Differential considerations include metastatic adenopathy or lymphoproliferative disorder.   Electronically Signed   By: Kerby Moors M.D.   On: 01/04/2014 20:27   US Renal  01/05/2014   CLINICAL DATA:  Acute on chronic kidney failure. Monoclonal  gammopathy. Diabetes.  EXAM: RENAL/URINARY TRACT ULTRASOUND COMPLETE  COMPARISON:  01/11/2012  FINDINGS: Right Kidney:  Length: 12.2 cm. Diffusely increased parenchymal echogenicity which is increased since previous exam. Several small renal cysts noted, but no masses identified. No evidence of hydronephrosis.  Left Kidney:  Length: 10.0 cm. Diffusely increased parenchymal echogenicity which is increased since previous study. No evidence of renal mass or hydronephrosis.  Bladder:  Appears normal for degree of bladder distention.  IMPRESSION: Diffusely increased renal parenchymal echogenicity, consistent with medical renal disease. No evidence of hydronephrosis.   Electronically Signed   By: Earle Gell M.D.   On: 01/05/2014 20:34   . antiseptic oral rinse  15 mL Mouth Rinse BID  . aspirin  325 mg Oral Daily  . carvedilol  6.25 mg Oral BID WC  . furosemide  40 mg Oral BID  . heparin  5,000 Units Subcutaneous 3 times per day  . insulin aspart protamine- aspart  12 Units Subcutaneous Q breakfast  . [START ON 01/07/2014] levofloxacin  500 mg Oral Q48H    BMET    Component Value Date/Time   NA 138 01/06/2014 0845   K 5.5* 01/06/2014 0845   CL 100 01/06/2014 0845  CO2 21 01/06/2014 0845   GLUCOSE 160* 01/06/2014 0845   BUN 77* 01/06/2014 0845   CREATININE 10.80* 01/06/2014 0845   CREATININE 2.55* 03/07/2012 0845   CALCIUM 8.6 01/06/2014 0845   GFRNONAA 5* 01/06/2014 0845   GFRAA 5* 01/06/2014 0845   CBC    Component Value Date/Time   WBC 7.6 01/05/2014 0358   WBC 5.5 03/02/2012 1051   RBC 3.84* 01/05/2014 0358   RBC 5.77 03/02/2012 1051   HGB 8.9* 01/05/2014 0358   HGB 13.5 03/02/2012 1051   HCT 29.1* 01/05/2014 0358   HCT 43.2 03/02/2012 1051   PLT 228 01/05/2014 0358   PLT 206 03/02/2012 1051   MCV 75.8* 01/05/2014 0358   MCV 74.9* 03/02/2012 1051   MCH 23.2* 01/05/2014 0358   MCH 23.5* 03/02/2012 1051   MCHC 30.6 01/05/2014 0358   MCHC 31.3* 03/02/2012 1051   RDW 15.9* 01/05/2014 0358   RDW 13.7  03/02/2012 1051   LYMPHSABS 1.5 03/02/2012 1051   LYMPHSABS 1.4 01/10/2012 1438   MONOABS 0.5 03/02/2012 1051   MONOABS 1.0 01/10/2012 1438   EOSABS 0.1 03/02/2012 1051   EOSABS 0.0 01/10/2012 1438   BASOSABS 0.0 03/02/2012 1051   BASOSABS 0.1 01/10/2012 1438     Assessment/Plan:  1. AKI/CKD (possibly related to conversion of MGUS to MM) vs. Progressive CKD now at ESRD due to poorly controlled DM and HTN.  1. Responding to IV lasix, however BUN and Scr cont to rise 2. Discussed severity and chronicity of his disease process and pt is amenable to proceed with initiating dialysis. 3. Discussed with  Dr. Oneida Alar and plan for tunneled HD cath tomorrow followed by HD and then will have vein mapping and permanent access placed later in his hospital stay and prior to discharge 4. Educate on RRT 5. Renal US c/w chronic medical disease, no hydro.   6. Stress importance of compliance and follow up 2. Hyperkalemia- cont to treat medically with lasix and follow. Will add kayexalate due to [facility] placement tomorrow 3. Metabolic acidosis- as above, should respond to lasix, but may need bicarb  4. MGUS- will recheck SPEP/UPEP which has not been done in 2 years 5. HTN- variable. Would not add ACE/ARB given advanced CKD and hyperkalemia. 6. Anemia- microcytic. Check iron stores as well as SPEP/UPEP as above 7. RUL densities on CT scan- worrisome for bronchogenic carcinoma in pt with +tob history and Etoh abuse. F/u CT in 6 months per Radiology 8. MBD- will check ca, phos, iPTH but will likely require vitamin D 9. Dispo- will need SW consult to help with financial issues and dialysis clearance  Keinan Brouillet A

## 2014-01-06 NOTE — Progress Notes (Signed)
[facility] PROGRESS NOTE Manessa Buley L. [name], MD               Pager [phone] (if 7P to 7A, page night hospitalist on [email or web address] Duran [id number] DOB: [birth year] DOA: 01/04/2014 PCP: Gara Kroner, MD  Assessment/Plan: 1. CAP - upper lobe PNA on CT w/ LAD. Start on levaquin dosed per pharmacy  q48 hours 2. AoCKD w/ volume overload - stage V CKD at this time w/ GFR < 10 - noncompliant w/ home lasix. Could be 2/2 MGUS/MM picture that has been untreated/unfollowed for past 2years. Nephro handling diuresis and lab work up. Appreciate their help. Pt is being educated on dialysis and will have vein mapping performed. All labs are pending this AM.  3.   MGUS - appears he was diagnosed in 2013 and then was noncomplaint w/ follow up.  SPEP/UPEP pending   Principal Problem:   Acute on chronic kidney failure Active Problems:   Diabetes mellitus   Monoclonal gammopathies   Code Status: full Family Communication: none  Disposition Plan: 3-5 days   Velna Hatchet, MD  Internal Medicine Pager [phone].  If 7PM-7AM, please contact night-coverage at [email or web address], password [facility] 01/06/2014, 9:23 AM   LOS: 2 days   Brief narrative: CKD patient seen by [facility] who failed compliance w/ their lasix and presents w/ cough. CT shows possible PNA and volume overload. Nephro consulted in ED, to see in AM   Consultants:  [facility]  Procedures:  None   Antibiotics:  levaquin 2/21 -->  ?  HPI/Subjective: No events overnight  Objective: Filed Vitals:   01/05/14 1809 01/05/14 2051 01/06/14 0500 01/06/14 0531  BP: 122/70 139/79  129/78  Pulse: 72 71  72  Temp: 97.9 F (36.6 C) 98 F (36.7 C)  97.6 F (36.4 C)  TempSrc: Oral Oral  Oral  Resp: 24 24  28   Height:      Weight:  144.9 kg (319 lb 7.1 oz) 144.9 kg (319 lb 7.1 oz)   SpO2: 99% 100%  99%    Intake/Output Summary (Last 24 hours) at 01/06/14 [id number] Last data filed at 01/06/14 0400  Gross per 24  hour  Intake   1080 ml  Output   1150 ml  Net    -70 ml    Exam:   General:  [name] in NAD   HEENT: MMM, anicteric   Cardiovascular: RRR, no MRG   Respiratory: crackles at bases, good air movement throughout  Abd: ND, NT, normal BS  Ext: trace pitting edema B/L    Data Reviewed: Basic Metabolic Panel:  Recent Labs Lab 01/04/14 1649 01/05/14 0358  NA 139 137  K 5.3 5.8*  CL 104 103  CO2 19 18*  GLUCOSE 149* 144*  BUN 69* 73*  CREATININE 9.41* 9.97*  CALCIUM 8.5 8.5   Liver Function Tests: No results found for this basename: AST, ALT, ALKPHOS, BILITOT, PROT, ALBUMIN,  in the last 168 hours No results found for this basename: LIPASE, AMYLASE,  in the last 168 hours No results found for this basename: AMMONIA,  in the last 168 hours CBC:  Recent Labs Lab 01/04/14 1649 01/05/14 0358  WBC 5.9 7.6  HGB 8.9* 8.9*  HCT 29.4* 29.1*  MCV 75.8* 75.8*  PLT 225 228   Cardiac Enzymes: No results found for this basename: CKTOTAL, CKMB, CKMBINDEX, TROPONINI,  in the last 168 hours BNP: No components found with this basename: POCBNP,  CBG:  Recent Labs Lab  01/05/14 0726 01/05/14 1200 01/05/14 1722 01/05/14 2122 01/06/14 0811  GLUCAP 142* 139* 116* 129* 146*    No results found for this or any previous visit (from the past 240 hour(s)).   Studies: Dg Chest 2 View  01/04/2014   CLINICAL DATA:  Right-sided chest pain with chest congestion and shortness of breath.  EXAM: CHEST  2 VIEW  COMPARISON:  01/04/2014 and 01/10/2012 and 04/02/2008  FINDINGS: There is cardiomegaly. Pulmonary vascularity appears normal. There is a faint right perihilar infiltrate with fullness of the right hilum. There are no effusions. Left lung is clear. No acute osseous abnormality.  IMPRESSION: Right perihilar infiltrate with fullness in the right hilum and in the azygos region. This could represent pneumonia with adenopathy but the possibility of adenopathy and a mass with  postobstructive infiltrate should be considered. Follow-up is recommended.   Electronically Signed   By: Rozetta Nunnery M.D.   On: 01/04/2014 19:10   Dg Chest 2 View  01/04/2014   CLINICAL DATA:  Bronchitis. Shortness of breath. History of congestive heart failure.  EXAM: CHEST  2 VIEW  COMPARISON:  01/10/2012.  04/02/2008.  FINDINGS: Cardiac silhouette is enlarged. The aorta is unfolded. There is extensive overlying soft tissue density. I do not think there is any dense consolidation, collapse or a fusion. Difficulty completely rule out a patchy infiltrate in the right lung, though I favor this is overlying soft tissue. The right hilum is prominent, more so than seen on some of the older studies. Because of the question of right hilar enlargement and right pulmonary parenchymal density, I would suggest that a chest CT to ensure that we are not missing neoplastic disease. No effusions. No significant bony finding.  IMPRESSION: Cardiac enlargement.  Unfolded aorta up.  Question right pulmonary parenchymal density in the lower lung with enlarged right hilum. Whereas this could be due to inflammatory disease, consider chest CT to rule out neoplastic disease.   Electronically Signed   By: Nelson Chimes M.D.   On: 01/04/2014 11:47   Ct Chest Wo Contrast  01/04/2014   CLINICAL DATA:  Short of breath.  Right flank lower ribcage pain.  EXAM: CT CHEST WITHOUT CONTRAST  TECHNIQUE: Multidetector CT imaging of the chest was performed following the standard protocol without IV contrast.  COMPARISON:  Chest radiograph 01/04/2014  FINDINGS: No pleural effusion identified. Peripheral and basilar predominant interstitial reticulation is noted. Right upper lobe peribronchovascular densities are identified, image 24/series 3. A few scattered peribronchovascular densities are noted in the central right lower lobe. Right apical nodule is identified measuring 6 mm, image 10/series 3.  The heart size is mild to moderately enlarged.  There is no pericardial effusion. Prominent mediastinal lymph nodes are identified. Left paratracheal lymph node measures 1.6 cm, image 19/series 2. Right paratracheal lymph node is equal to 1.2 cm, image 20/series 2. The sub- carinal lymph node measures 1.6 cm, image 28/series 2.  There is no axillary or supraclavicular adenopathy. Incidental imaging through the upper abdomen is unremarkable.  Review of the visualized bony structures demonstrates no aggressive lytic or sclerotic bone lesions.  IMPRESSION: 1. Right upper lobe peribronchovascular densities are favored to represent early pneumonia. Findings may account for the patient's right sided chest pain. 2. Right apical nodule measures 6 mm. If the patient is at high risk for bronchogenic carcinoma, follow-up chest CT at 6-12 months is recommended. If the patient is at low risk for bronchogenic carcinoma, follow-up chest CT at 12 months is recommended.  This recommendation follows the consensus statement: Guidelines for Management of Small Pulmonary Nodules Detected on CT Scans: A Statement from the [place] as published in Radiology 2005;237:395-400. 3. Enlarged mediastinal lymph nodes are nonspecific. In the setting of infection it is conceivable that these could be reactive in etiology. Differential considerations include metastatic adenopathy or lymphoproliferative disorder.   Electronically Signed   By: Kerby Moors M.D.   On: 01/04/2014 20:27    Scheduled Meds: . antiseptic oral rinse  15 mL Mouth Rinse BID  . aspirin  325 mg Oral Daily  . carvedilol  6.25 mg Oral BID WC  . furosemide  40 mg Oral BID  . heparin  5,000 Units Subcutaneous 3 times per day  . insulin aspart protamine- aspart  12 Units Subcutaneous Q breakfast  . [START ON 01/07/2014] levofloxacin  500 mg Oral Q48H   Continuous Infusions:    Time Spent: 30 minutes

## 2014-01-07 ENCOUNTER — Inpatient Hospital Stay (HOSPITAL_COMMUNITY): Payer: Medicaid Other | Admitting: Anesthesiology

## 2014-01-07 ENCOUNTER — Inpatient Hospital Stay (HOSPITAL_COMMUNITY): Payer: Medicaid Other

## 2014-01-07 ENCOUNTER — Encounter (HOSPITAL_COMMUNITY)
Admission: EM | Disposition: A | Discharge status: Home / Self Care | Payer: Self-pay | Source: Home / Self Care | Attending: Family Medicine

## 2014-01-07 ENCOUNTER — Encounter (HOSPITAL_COMMUNITY): Payer: Self-pay | Admitting: Anesthesiology

## 2014-01-07 ENCOUNTER — Encounter (HOSPITAL_COMMUNITY): Payer: Medicaid Other | Admitting: Anesthesiology

## 2014-01-07 DIAGNOSIS — N185 Chronic kidney disease, stage 5: Secondary | ICD-10-CM

## 2014-01-07 DIAGNOSIS — Z01810 Encounter for preprocedural cardiovascular examination: Secondary | ICD-10-CM

## 2014-01-07 HISTORY — PX: INSERTION OF DIALYSIS CATHETER: SHX1324

## 2014-01-07 LAB — GLUCOSE, CAPILLARY
GLUCOSE-CAPILLARY: 109 mg/dL — AB (ref 70–99)
Glucose-Capillary: 99 mg/dL (ref 70–99)
Glucose-Capillary: 112 mg/dL — ABNORMAL HIGH (ref 70–99)
Glucose-Capillary: 122 mg/dL — ABNORMAL HIGH (ref 70–99)
Glucose-Capillary: 151 mg/dL — ABNORMAL HIGH (ref 70–99)
Glucose-Capillary: 153 mg/dL — ABNORMAL HIGH (ref 70–99)

## 2014-01-07 LAB — RENAL FUNCTION PANEL
Albumin: 3 g/dL — ABNORMAL LOW (ref 3.5–5.2)
BUN: 78 mg/dL — ABNORMAL HIGH (ref 6–23)
CHLORIDE: 102 meq/L (ref 96–112)
CO2: 19 meq/L (ref 19–32)
Calcium: 8.2 mg/dL — ABNORMAL LOW (ref 8.4–10.5)
Creatinine, Ser: 10.81 mg/dL — ABNORMAL HIGH (ref 0.50–1.35)
GFR, EST AFRICAN AMERICAN: 5 mL/min — AB (ref >90)
GFR, EST NON AFRICAN AMERICAN: 5 mL/min — AB (ref >90)
Glucose, Bld: 108 mg/dL — ABNORMAL HIGH (ref 70–99)
Phosphorus: 7.3 mg/dL — ABNORMAL HIGH (ref 2.3–4.6)
Potassium: 5 mEq/L (ref 3.7–5.3)
SODIUM: 139 meq/L (ref 137–147)

## 2014-01-07 LAB — HEPATITIS PANEL, ACUTE
HCV AB: NEGATIVE
HEP B S AG: NEGATIVE
Hep A IgM: NONREACTIVE
Hep B C IgM: NONREACTIVE

## 2014-01-07 LAB — HEPATITIS B SURFACE ANTIBODY,QUALITATIVE: Hep B S Ab: NEGATIVE

## 2014-01-07 LAB — SURGICAL PCR SCREEN
MRSA, PCR: NEGATIVE
Staphylococcus aureus: POSITIVE — AB

## 2014-01-07 LAB — HEPATITIS B SURFACE ANTIGEN: Hepatitis B Surface Ag: NEGATIVE

## 2014-01-07 LAB — HEPATITIS B CORE ANTIBODY, TOTAL: Hep B Core Total Ab: NONREACTIVE

## 2014-01-07 LAB — PARATHYROID HORMONE, INTACT (NO CA): PTH: 1539.8 pg/mL — AB (ref 14.0–72.0)

## 2014-01-07 SURGERY — INSERTION OF DIALYSIS CATHETER
Anesthesia: Monitor Anesthesia Care | Site: Neck

## 2014-01-07 MED ORDER — NEPRO/CARBSTEADY PO LIQD: 237.0000 mL | ORAL | Status: DC | PRN

## 2014-01-07 MED ORDER — FENTANYL CITRATE 0.05 MG/ML IJ SOLN
INTRAMUSCULAR | Status: AC
  Filled 2014-01-07: qty 10

## 2014-01-07 MED ORDER — LIDOCAINE-PRILOCAINE 2.5-2.5 % EX CREA: 1.0000 "application " | TOPICAL_CREAM | CUTANEOUS | Status: DC | PRN

## 2014-01-07 MED ORDER — ALTEPLASE 2 MG IJ SOLR: 2.0000 mg | Freq: Once | INTRAMUSCULAR | Status: DC | PRN

## 2014-01-07 MED ORDER — 0.9 % SODIUM CHLORIDE (POUR BTL) OPTIME
TOPICAL | Status: DC | PRN
  Administered 2014-01-07: 1000 mL

## 2014-01-07 MED ORDER — PROPOFOL 10 MG/ML IV BOLUS
INTRAVENOUS | Status: AC
  Filled 2014-01-07: qty 20

## 2014-01-07 MED ORDER — FENTANYL CITRATE 0.05 MG/ML IJ SOLN: 25.0000 ug | INTRAMUSCULAR | Status: DC | PRN

## 2014-01-07 MED ORDER — ZOLPIDEM TARTRATE 5 MG PO TABS
5.0000 mg | ORAL_TABLET | Freq: Once | ORAL | Status: AC
  Administered 2014-01-08: 5 mg via ORAL
  Filled 2014-01-07: qty 1

## 2014-01-07 MED ORDER — MIDAZOLAM HCL 2 MG/2ML IJ SOLN
INTRAMUSCULAR | Status: AC
  Filled 2014-01-07: qty 2

## 2014-01-07 MED ORDER — SODIUM CHLORIDE 0.9 % IV SOLN: 100.0000 mL | INTRAVENOUS | Status: DC | PRN

## 2014-01-07 MED ORDER — HEPARIN SODIUM (PORCINE) 1000 UNIT/ML IJ SOLN
INTRAMUSCULAR | Status: AC
  Filled 2014-01-07: qty 1

## 2014-01-07 MED ORDER — MIDAZOLAM HCL 5 MG/5ML IJ SOLN
INTRAMUSCULAR | Status: DC | PRN
  Administered 2014-01-07 (×2): 1 mg via INTRAVENOUS

## 2014-01-07 MED ORDER — SODIUM CHLORIDE 0.9 % IR SOLN
Status: DC | PRN
  Administered 2014-01-07: 10:00:00

## 2014-01-07 MED ORDER — HEPARIN SODIUM (PORCINE) 1000 UNIT/ML IJ SOLN
INTRAMUSCULAR | Status: DC | PRN
  Administered 2014-01-07: 10 mL via INTRAVENOUS

## 2014-01-07 MED ORDER — FENTANYL CITRATE 0.05 MG/ML IJ SOLN
INTRAMUSCULAR | Status: DC | PRN
  Administered 2014-01-07 (×2): 50 ug via INTRAVENOUS

## 2014-01-07 MED ORDER — SODIUM CHLORIDE 0.9 % IV SOLN
INTRAVENOUS | Status: DC
  Administered 2014-01-07: 10:00:00 via INTRAVENOUS

## 2014-01-07 MED ORDER — PENTAFLUOROPROP-TETRAFLUOROETH EX AERO: 1.0000 "application " | INHALATION_SPRAY | CUTANEOUS | Status: DC | PRN

## 2014-01-07 MED ORDER — LIDOCAINE HCL (PF) 1 % IJ SOLN: 5.0000 mL | INTRAMUSCULAR | Status: DC | PRN

## 2014-01-07 MED ORDER — SODIUM CHLORIDE 0.9 % IV SOLN
INTRAVENOUS | Status: DC | PRN
  Administered 2014-01-07: 10:00:00 via INTRAVENOUS

## 2014-01-07 MED ORDER — HEPARIN SODIUM (PORCINE) 1000 UNIT/ML DIALYSIS: 20.0000 [IU]/kg | INTRAMUSCULAR | Status: DC | PRN

## 2014-01-07 MED ORDER — LIDOCAINE-EPINEPHRINE (PF) 1 %-1:200000 IJ SOLN
INTRAMUSCULAR | Status: DC | PRN
  Administered 2014-01-07: 30 mL

## 2014-01-07 MED ORDER — VANCOMYCIN HCL IN DEXTROSE 1-5 GM/200ML-% IV SOLN
1000.0000 mg | INTRAVENOUS | Status: AC
  Administered 2014-01-10: 1000 mg via INTRAVENOUS
  Filled 2014-01-07 (×2): qty 200

## 2014-01-07 MED ORDER — HEPARIN SODIUM (PORCINE) 1000 UNIT/ML DIALYSIS: 1000.0000 [IU] | INTRAMUSCULAR | Status: DC | PRN

## 2014-01-07 SURGICAL SUPPLY — 47 items
BAG DECANTER FOR FLEXI CONT (MISCELLANEOUS) ×2 IMPLANT
CATH CANNON HEMO 15F 50CM (CATHETERS) IMPLANT
CATH CANNON HEMO 15FR 19 (HEMODIALYSIS SUPPLIES) IMPLANT
CATH CANNON HEMO 15FR 23CM (HEMODIALYSIS SUPPLIES) ×2 IMPLANT
CATH CANNON HEMO 15FR 31CM (HEMODIALYSIS SUPPLIES) IMPLANT
CATH CANNON HEMO 15FR 32CM (HEMODIALYSIS SUPPLIES) IMPLANT
CATH STRAIGHT 5FR 65CM (CATHETERS) ×2 IMPLANT
COVER PROBE W GEL 5X96 (DRAPES) ×2 IMPLANT
COVER SURGICAL LIGHT HANDLE (MISCELLANEOUS) ×2 IMPLANT
DERMABOND ADHESIVE PROPEN (GAUZE/BANDAGES/DRESSINGS) ×1
DERMABOND ADVANCED (GAUZE/BANDAGES/DRESSINGS)
DERMABOND ADVANCED .7 DNX12 (GAUZE/BANDAGES/DRESSINGS) IMPLANT
DERMABOND ADVANCED .7 DNX6 (GAUZE/BANDAGES/DRESSINGS) ×1 IMPLANT
DRAPE C-ARM 42X72 X-RAY (DRAPES) ×2 IMPLANT
DRAPE CHEST BREAST 15X10 FENES (DRAPES) ×2 IMPLANT
GAUZE SPONGE 2X2 8PLY STRL LF (GAUZE/BANDAGES/DRESSINGS) ×1 IMPLANT
GAUZE SPONGE 4X4 16PLY XRAY LF (GAUZE/BANDAGES/DRESSINGS) ×2 IMPLANT
GLOVE BIO SURGEON STRL SZ7 (GLOVE) ×2 IMPLANT
GLOVE BIOGEL PI IND STRL 7.5 (GLOVE) ×1 IMPLANT
GLOVE BIOGEL PI INDICATOR 7.5 (GLOVE) ×1
GOWN STRL REUS W/ TWL LRG LVL3 (GOWN DISPOSABLE) ×3 IMPLANT
GOWN STRL REUS W/TWL LRG LVL3 (GOWN DISPOSABLE) ×3
GUIDEWIRE AMPLATZ STIFF 0.35 (WIRE) ×2 IMPLANT
KIT BASIN OR (CUSTOM PROCEDURE TRAY) ×2 IMPLANT
KIT ROOM TURNOVER OR (KITS) ×2 IMPLANT
NEEDLE 18GX1X1/2 (RX/OR ONLY) (NEEDLE) ×2 IMPLANT
NEEDLE HYPO 25GX1X1/2 BEV (NEEDLE) ×2 IMPLANT
NS IRRIG 1000ML POUR BTL (IV SOLUTION) ×2 IMPLANT
PACK SURGICAL SETUP 50X90 (CUSTOM PROCEDURE TRAY) ×2 IMPLANT
PAD ARMBOARD 7.5X6 YLW CONV (MISCELLANEOUS) ×4 IMPLANT
SET MICROPUNCTURE 5F STIFF (MISCELLANEOUS) IMPLANT
SOAP 2 % CHG 4 OZ (WOUND CARE) ×2 IMPLANT
SPONGE GAUZE 2X2 STER 10/PKG (GAUZE/BANDAGES/DRESSINGS) ×1
SPONGE GAUZE 4X4 12PLY (GAUZE/BANDAGES/DRESSINGS) ×2 IMPLANT
SUT ETHILON 3 0 PS 1 (SUTURE) ×2 IMPLANT
SUT MNCRL AB 4-0 PS2 18 (SUTURE) ×2 IMPLANT
SYR 20CC LL (SYRINGE) ×4 IMPLANT
SYR 30ML LL (SYRINGE) IMPLANT
SYR 3ML LL SCALE MARK (SYRINGE) ×2 IMPLANT
SYR 5ML LL (SYRINGE) ×2 IMPLANT
SYR CONTROL 10ML LL (SYRINGE) ×2 IMPLANT
SYRINGE 10CC LL (SYRINGE) ×2 IMPLANT
TAPE CLOTH SURG 4X10 WHT LF (GAUZE/BANDAGES/DRESSINGS) ×2 IMPLANT
TOWEL OR 17X24 6PK STRL BLUE (TOWEL DISPOSABLE) ×2 IMPLANT
TOWEL OR 17X26 10 PK STRL BLUE (TOWEL DISPOSABLE) ×2 IMPLANT
WATER STERILE IRR 1000ML POUR (IV SOLUTION) ×2 IMPLANT
WIRE AMPLATZ SS-J .035X180CM (WIRE) IMPLANT

## 2014-01-07 NOTE — H&P (View-Only) (Signed)
[facility] [place] HISTORY AND PHYSICAL  Reason for consult: hemodialysis access Requesting: [place] History of Present Illness:  Patient is a 58 y.o. year old male who presents for placement of a permanent hemodialysis access. The patient is left handed.  The patient is not currently on hemodialysis.  The cause of renal failure is thought to be secondary to hypertension and diabetes.  Other chronic medical problems include hyperlipidemia obesity, CHF.  Past Medical History  Diagnosis Date  . Diabetes mellitus   . Hypertension   . Hyperlipidemia   . Gout   . Alcohol abuse   . Tobacco abuse   . Renal insufficiency   . Congestive heart disease 12/2011  . Hyperlipidemia   . Monoclonal gammopathies 02/25/2012  . Pedal edema     Past Surgical History  Procedure Laterality Date  . Left knee arthroscopy with generalized joint synovectomy,  01/2006  . Ganglion cyst removal x 2       Social History History  Substance Use Topics  . Smoking status: Current Every Day Smoker -- 0.30 packs/day for 33 years    Types: Cigarettes  . Smokeless tobacco: Never Used  . Alcohol Use: 21.0 oz/week    42 drink(s) per week    Family History Family History  Problem Relation Age of Onset  . Lung cancer Father   . Diabetes Mother   . Heart disease Mother     Allergies  Allergies  Allergen Reactions  . Penicillins Nausea And Vomiting and Other (See Comments)    Stomach pains also  . Vioxx [Rofecoxib] Nausea Only and Other (See Comments)    Gi upset.     Current Facility-Administered Medications  Medication Dose Route Frequency Provider Last Rate Last Dose  . antiseptic oral rinse (BIOTENE) solution 15 mL  15 mL Mouth Rinse BID Etta Quill, DO   15 mL at 01/06/14 0800  . aspirin tablet 325 mg  325 mg Oral Daily Etta Quill, DO   325 mg at 01/06/14 [id number]  . carvedilol (COREG) tablet 6.25 mg  6.25 mg Oral BID WC Etta Quill, DO   6.25 mg at 01/06/14 [id number]   . [START ON 01/07/2014] cefUROXime (ZINACEF) 1.5 g in dextrose 5 % 50 mL IVPB  1.5 g Intravenous On Call to [place], MD      . furosemide (LASIX) tablet 40 mg  40 mg Oral BID Velna Hatchet, MD   40 mg at 01/06/14 [id number]  . heparin injection 5,000 Units  5,000 Units Subcutaneous 3 times per day Etta Quill, DO   5,000 Units at 01/06/14 1411  . insulin aspart protamine- aspart (NOVOLOG MIX 70/30) injection 12 Units  12 Units Subcutaneous Q breakfast Etta Quill, DO   12 Units at 01/06/14 0800  . [START ON 01/07/2014] levofloxacin (LEVAQUIN) tablet 500 mg  500 mg Oral Q48H Ace Gins, RPH      . oxyCODONE (Oxy IR/ROXICODONE) immediate release tablet 5 mg  5 mg Oral Q4H PRN Ritta Slot, NP   5 mg at 01/06/14 0422    ROS:   General:  No weight loss, Fever, chills  HEENT: No recent headaches, no nasal bleeding, no visual changes, no sore throat  Neurologic: No dizziness, blackouts, seizures. No recent symptoms of stroke or mini- stroke. No recent episodes of slurred speech, or temporary blindness.  Cardiac: No recent episodes of chest pain/pressure, no shortness of breath at rest.  No shortness of  breath with exertion.  Denies history of atrial fibrillation or irregular heartbeat  Vascular: No history of rest pain in feet.  No history of claudication.  No history of non-healing ulcer, No history of DVT   Pulmonary: No home oxygen, no productive cough, no hemoptysis,  No asthma or wheezing  Musculoskeletal:  [ ]  Arthritis, [ ]  Low back pain,  [ ]  Joint pain  Hematologic:No history of hypercoagulable state.  No history of easy bleeding.  No history of anemia  Gastrointestinal: No hematochezia or melena,  No gastroesophageal reflux, no trouble swallowing  Urinary: [x ] chronic Kidney disease, [ ]  on HD - [ ]  MWF or [ ]  TTHS, [ ]  Burning with urination, [ ]  Frequent urination, [ ]  Difficulty urinating;   Skin: No rashes  Psychological: No history of anxiety,  No history  of depression   Physical Examination  Filed Vitals:   01/06/14 0500 01/06/14 0531 01/06/14 0900 01/06/14 1406  BP:  129/78 142/68 133/62  Pulse:  72 70 67  Temp:  97.6 F (36.4 C) 97.8 F (36.6 C) 98.7 F (37.1 C)  TempSrc:  Oral Oral Oral  Resp:  28 23 22   Height:      Weight: 319 lb 7.1 oz (144.9 kg)  315 lb 14.4 oz (143.291 kg)   SpO2:  99% 100% 99%    Body mass index is 41.69 kg/(m^2).  General:  Alert and oriented, no acute distress HEENT: Normal Neck: No bruit or JVD Pulmonary: Clear to auscultation bilaterally Cardiac: Regular Rate and Rhythm without murmur Gastrointestinal: Soft, non-tender, non-distended, no mass, no scars Skin: No rash Extremity Pulses:  2+ radial, brachial pulses bilaterally Musculoskeletal: No deformity or edema  Neurologic: Upper and lower extremity motor 5/5 and symmetric  DATA: Vein map pending  CBC    Component Value Date/Time   WBC 7.6 01/05/2014 0358   WBC 5.5 03/02/2012 1051   RBC 3.84* 01/05/2014 0358   RBC 5.77 03/02/2012 1051   HGB 8.9* 01/05/2014 0358   HGB 13.5 03/02/2012 1051   HCT 29.1* 01/05/2014 0358   HCT 43.2 03/02/2012 1051   PLT 228 01/05/2014 0358   PLT 206 03/02/2012 1051   MCV 75.8* 01/05/2014 0358   MCV 74.9* 03/02/2012 1051   MCH 23.2* 01/05/2014 0358   MCH 23.5* 03/02/2012 1051   MCHC 30.6 01/05/2014 0358   MCHC 31.3* 03/02/2012 1051   RDW 15.9* 01/05/2014 0358   RDW 13.7 03/02/2012 1051   LYMPHSABS 1.5 03/02/2012 1051   LYMPHSABS 1.4 01/10/2012 1438   MONOABS 0.5 03/02/2012 1051   MONOABS 1.0 01/10/2012 1438   EOSABS 0.1 03/02/2012 1051   EOSABS 0.0 01/10/2012 1438   BASOSABS 0.0 03/02/2012 1051   BASOSABS 0.1 01/10/2012 1438    BMET    Component Value Date/Time   NA 138 01/06/2014 0845   K 5.5* 01/06/2014 0845   CL 100 01/06/2014 0845   CO2 21 01/06/2014 0845   GLUCOSE 160* 01/06/2014 0845   BUN 77* 01/06/2014 0845   CREATININE 10.80* 01/06/2014 0845   CREATININE 2.55* 03/07/2012 0845   CALCIUM 8.6 01/06/2014 0845    GFRNONAA 5* 01/06/2014 0845   GFRAA 5* 01/06/2014 0845       ASSESSMENT: CKD 5 starting dialysis.  Pt needs vein map to decide on permanent access plan.  He is left hand dominant so will try to use right arm   PLAN:  Diatek catheter tomorrow Dr Bridgett Larsson.  Permanent access later this week. NPO p  midnight, consent.  Ruta Hinds, MD [facility] [place] Office: [phone] Pager: [phone]

## 2014-01-07 NOTE — Procedures (Signed)
I have seen and examined this patient and agree with the plan of care. Patient seen in dialysis no complaints [name] W 01/07/2014, 12:39 PM

## 2014-01-07 NOTE — Interval H&P Note (Signed)
History and Physical Interval Note:  01/07/2014 7:22 AM  [name]  has presented today for surgery, with the diagnosis of Monoclonal gammopathies  The various methods of treatment have been discussed with the patient and family. After consideration of risks, benefits and other options for treatment, the patient has consented to  Procedure(s): INSERTION OF DIALYSIS CATHETER (N/A) as a surgical intervention .  The patient's history has been reviewed, patient examined, no change in status, stable for surgery.  I have reviewed the patient's chart and labs.  Questions were answered to the patient's satisfaction.     Kelley Knoth LIANG-YU

## 2014-01-07 NOTE — Progress Notes (Signed)
Right  Upper Extremity Vein Map    Cephalic  Segment Diameter Depth Comment  1. Axilla 2.56mm mm   2. Mid upper arm 3.16mm mm   3. Above AC 4.12mm mm   4. In AC 4.65mm mm   5. Below AC 4.68mm mm   6. Mid forearm 4.74mm mm   7. Wrist 3.30mm mm    mm mm    mm mm    mm mm      Left Upper Extremity Vein Map    Cephalic  Segment Diameter Depth Comment  1. Axilla 4.59mm mm   2. Mid upper arm 38mm mm   3. Above AC 3.61mm mm   4. In [facility] 4.21mm mm   5. Below AC 3.29mm mm   6. Mid forearm 5.101mm mm Branch  7. Wrist 3.84mm mm Branch   mm mm    mm mm    mm mm

## 2014-01-07 NOTE — Preoperative (Signed)
Beta Blockers   Reason not to administer Beta Blockers:Not Applicable 

## 2014-01-07 NOTE — Op Note (Signed)
OPERATIVE NOTE  PROCEDURE: 1. Right internal jugular vein tunneled dialysis catheter placement 2. Right internal jugular vein cannulation under ultrasound guidance  PRE-OPERATIVE DIAGNOSIS: end-stage renal failure  POST-OPERATIVE DIAGNOSIS: same as above  SURGEON: Adele Barthel, MD  ANESTHESIA: local and MAC  ESTIMATED BLOOD LOSS: 30 cc  FINDING(S): 1.  Tips of the catheter in the right atrium on fluoroscopy 2.  No obvious pneumothorax on fluoroscopy  SPECIMEN(S):  none  INDICATIONS:   [name] is a 58 y.o. male who presents with end stage renal disease.  The patient presents for tunneled dialysis catheter placement.  The patient is aware the risks of tunneled dialysis catheter placement include but are not limited to: bleeding, infection, central venous injury, pneumothorax, possible venous stenosis, possible malpositioning in the venous system, and possible infections related to long-term catheter presence.  The patient was aware of these risks and agreed to proceed.  DESCRIPTION: After written full informed consent was obtained from the patient, the patient was taken back to the operating room.  Prior to induction, the patient was given IV antibiotics.  After obtaining adequate sedation, the patient was prepped and draped in the standard fashion for a chest or neck tunneled dialysis catheter placement.  I anesthesized the neck cannulation site with local anesthetic.  Under ultrasound guidance, the right internal jugular vein was cannulated with the 18 gauge needle.  A J-wire was then placed down in the inferior vena cava under fluroscopic guidance.  The wire was then secured in place with a clamp to the drapes.  The cannulation site, the catheter exit site, and tract for the subcutaneous tunnel were then anesthesized with a total of 20 cc of a 1:1 mixture of 0.5% Marcaine without epinepherine and 1% Lidocaine with epinepherine.  I then made stab incisions at the neck and exit  sites.   I dissected from the exit site to the cannulation site with a metal tunneler.   The subcutaneous tunnel was dilated by passing a plastic dilator over the metal dissector. The wire was then unclamped and I removed the needle.  The skin tract and venotomy was dilated serially with dilators.  The medium dilator had problems dilating the tract, so I exchanged it for an endhole catheter.  The wire was then exchanged for an Amplatz wire.   Finally, the dilator-sheath was placed under fluroscopic guidance into the superior vena cava.  The dilator and wire were removed.  A 23 cm Diatek catheter was placed under fluoroscopic guidance down into the right atrium.  The sheath was broken and peeled away while holding the catheter cuff at the level of the skin.  The back end of this catheter was transected, revealing the two lumens of this catheter.  The ports were docked onto these two lumens.  The catheter hub was then screwed into place.  Each port was tested by aspirating and flushing.  No resistance was noted.  Each port was then thoroughly flushed with heparinized saline.  The catheter was secured in placed with two interrupted stitches of 3-0 Nylon tied to the catheter.  The neck incision was closed with a U-stitch of 4-0 Monocryl.  The neck and chest incision were cleaned and sterile bandages applied.  Each port was then loaded with concentrated heparin (1000 Units/mL) at the manufacturer recommended volumes to each port.  Sterile caps were applied to each port.  On completion fluoroscopy, the tips of the catheter were in the right atrium, and there was no  evidence of pneumothorax.  COMPLICATIONS: none  CONDITION: stable  Adele Barthel, MD [facility] [place] Office: [phone] Pager: [phone]  01/07/2014, 10:44 AM

## 2014-01-07 NOTE — Consult Note (Signed)
Vein mapping reviewed Catheter placed today  Has IV in right arm.  This is his dominant arm.  Filed Vitals:   01/07/14 1400 01/07/14 1430 01/07/14 1500 01/07/14 1504  BP: 136/82 142/87 148/78 144/82  Pulse: 55 55 57 67  Temp:    98.3 F (36.8 C)  TempSrc:    Oral  Resp:      Height:      Weight:    305 lb 1.9 oz (138.4 kg)  SpO2:    96%   Data: Vein map >41mm bilat  Will schedule for left radial cephalic AVF by my partner Dr Scot Dock on Thursday. Will do as outpt if discharged.\  Ruta Hinds, MD [facility] [place] Office: [phone] Pager: [phone]

## 2014-01-07 NOTE — Progress Notes (Signed)
   CARE MANAGEMENT NOTE 01/07/2014  Patient:  [name], [name]   Account Number:  [id number]  Date Initiated:  01/07/2014  Documentation initiated by:  Lizabeth Leyden  Subjective/Objective Assessment:   admitted with acute on chronic renal failure, CR 10.80     Action/Plan:   progression of care and discharge planning   Anticipated DC Date:     Anticipated DC Plan:  HOME/SELF CARE         Choice offered to / List presented to:             Status of service:  In process, will continue to follow Medicare Important Message given?   (If response is "NO", the following Medicare IM given date fields will be blank) Date Medicare IM given:   Date Additional Medicare IM given:    Discharge Disposition:    Per UR Regulation:    If discussed at Long Length of Stay Meetings, dates discussed:    Comments:  01/07/2014  [place], [place]  #1 HD:  01/07/2014  Financial Counselor/Brandi called lm regarding start of HD

## 2014-01-07 NOTE — Transfer of Care (Signed)
Immediate Anesthesia Transfer of Care Note  Patient: [name]  Procedure(s) Performed: Procedure(s): INSERTION OF DIALYSIS CATHETER (N/A)  Patient Location: PACU  Anesthesia Type:MAC  Level of Consciousness: awake, alert , oriented and sedated  Airway & Oxygen Therapy: Patient Spontanous Breathing and Patient connected to nasal cannula oxygen  Post-op Assessment: Report given to PACU RN, Post -op Vital signs reviewed and stable and Patient moving all extremities  Post vital signs: Reviewed and stable  Complications: No apparent anesthesia complications

## 2014-01-07 NOTE — Anesthesia Preprocedure Evaluation (Addendum)
Anesthesia Evaluation  Patient identified by MRN, date of birth, ID band Patient awake    Airway Mallampati: II      Dental   Pulmonary Current Smoker,  breath sounds clear to auscultation        Cardiovascular hypertension, +CHF Rhythm:Regular Rate:Normal     Neuro/Psych    GI/Hepatic negative GI ROS,   Endo/Other  diabetes  Renal/GU Renal disease     Musculoskeletal   Abdominal   Peds  Hematology   Anesthesia Other Findings   Reproductive/Obstetrics                         Anesthesia Physical Anesthesia Plan  ASA: III  Anesthesia Plan: MAC   Post-op Pain Management:    Induction: Intravenous  Airway Management Planned: Simple Face Mask  Additional Equipment:   Intra-op Plan:   Post-operative Plan:   Informed Consent: I have reviewed the patients History and Physical, chart, labs and discussed the procedure including the risks, benefits and alternatives for the proposed anesthesia with the patient or authorized representative who has indicated his/her understanding and acceptance.   Dental advisory given  Plan Discussed with: CRNA and Anesthesiologist  Anesthesia Plan Comments:        Anesthesia Quick Evaluation

## 2014-01-07 NOTE — Progress Notes (Signed)
Patient remains npo as ordered.

## 2014-01-07 NOTE — Anesthesia Postprocedure Evaluation (Signed)
  Anesthesia Post-op Note  Patient: [name]  Procedure(s) Performed: Procedure(s): INSERTION OF DIALYSIS CATHETER (N/A)  Patient Location: PACU  Anesthesia Type:MAC  Level of Consciousness: awake  Airway and Oxygen Therapy: Patient Spontanous Breathing  Post-op Pain: mild  Post-op Assessment: Post-op Vital signs reviewed  Post-op Vital Signs: Reviewed  Complications: No apparent anesthesia complications

## 2014-01-07 NOTE — Progress Notes (Signed)
[facility] PROGRESS NOTE  [name] [id number] DOB: [birth year] DOA: 01/04/2014 PCP: Gara Kroner, MD  Assessment/Plan: 1. Community acquired pneumonia- CXR  Showed upper lobe pneumonia, on Levaquin . 2. Acute on CKD- Patient getting HD today. 3. MGUS- Patient was diagnosed in 2013, and was noncompliant with follow up.    Code Status: Full code Family Communication: *No family at bedside Disposition Plan: *home when stable   Consultants:  None  Procedures:  none  Antibiotics:  *levaquin  HPI/Subjective: Patient seen and examined, getting HD today after vascular access was placed.  Objective: Filed Vitals:   01/07/14 1504  BP: 144/82  Pulse: 67  Temp: 98.3 F (36.8 C)  Resp:     Intake/Output Summary (Last 24 hours) at 01/07/14 1747 Last data filed at 01/07/14 1504  Gross per 24 hour  Intake    560 ml  Output   2300 ml  Net  -1740 ml   Filed Weights   01/06/14 2057 01/07/14 1225 01/07/14 1504  Weight: 143.291 kg (315 lb 14.4 oz) 145.4 kg (320 lb 8.8 oz) 138.4 kg (305 lb 1.9 oz)    Exam:  Physical Exam: Head: Normocephalic, atraumatic.  Eyes: No signs of jaundice, EOMI Nose: Mucous membranes dry.  Throat: Oropharynx nonerythematous, no exudate appreciated.  Neck: supple,No deformities, masses, or tenderness noted. Lungs: Normal respiratory effort. B/L Clear to auscultation, no crackles or wheezes.  Heart: Regular RR. S1 and S2 normal  Abdomen: BS normoactive. Soft, Nondistended, non-tender.  Extremities: No pretibial edema, no erythema   Data Reviewed: Basic Metabolic Panel:  Recent Labs Lab 01/04/14 1649 01/05/14 0358 01/06/14 0845 01/07/14 0647  NA 139 137 138 139  K 5.3 5.8* 5.5* 5.0  CL 104 103 100 102  CO2 19 18* 21 19  GLUCOSE 149* 144* 160* 108*  BUN 69* 73* 77* 78*  CREATININE 9.41* 9.97* 10.80* 10.81*  CALCIUM 8.5 8.5 8.6 8.2*  PHOS  --   --   --  7.3*   Liver Function Tests:  Recent Labs Lab  01/07/14 0647  ALBUMIN 3.0*   No results found for this basename: LIPASE, AMYLASE,  in the last 168 hours No results found for this basename: AMMONIA,  in the last 168 hours CBC:  Recent Labs Lab 01/04/14 1649 01/05/14 0358  WBC 5.9 7.6  HGB 8.9* 8.9*  HCT 29.4* 29.1*  MCV 75.8* 75.8*  PLT 225 228   Cardiac Enzymes: No results found for this basename: CKTOTAL, CKMB, CKMBINDEX, TROPONINI,  in the last 168 hours BNP (last 3 results)  Recent Labs  01/04/14 1649  PROBNP 2553.0*   CBG:  Recent Labs Lab 01/06/14 2100 01/07/14 0729 01/07/14 0929 01/07/14 1056 01/07/14 1147  GLUCAP 110* 109* 99 112* 122*    Recent Results (from the past 240 hour(s))  CULTURE, BLOOD (ROUTINE X 2)     Status: None   Collection Time    01/04/14  7:29 PM      Result Value Ref Range Status   Specimen Description BLOOD ARM LEFT   Final   Special Requests BOTTLES DRAWN AEROBIC AND ANAEROBIC 5CC   Final   Culture  Setup Time     Final   Value: 01/05/2014 00:56     Performed at [facility]   Culture     Final   Value:        BLOOD CULTURE RECEIVED NO GROWTH TO DATE CULTURE WILL BE HELD FOR 5 DAYS BEFORE ISSUING A FINAL NEGATIVE  REPORT     Performed at [facility]   Report Status PENDING   Incomplete  CULTURE, BLOOD (ROUTINE X 2)     Status: None   Collection Time    01/04/14  7:36 PM      Result Value Ref Range Status   Specimen Description BLOOD ARM RIGHT   Final   Special Requests BOTTLES DRAWN AEROBIC AND ANAEROBIC 5CC   Final   Culture  Setup Time     Final   Value: 01/05/2014 00:55     Performed at [facility]   Culture     Final   Value:        BLOOD CULTURE RECEIVED NO GROWTH TO DATE CULTURE WILL BE HELD FOR 5 DAYS BEFORE ISSUING A FINAL NEGATIVE REPORT     Performed at [facility]   Report Status PENDING   Incomplete     Studies: Dg Chest Port 1 View  01/07/2014   CLINICAL DATA:  58 year old male status post dialysis catheter placement.  Initial encounter.  EXAM: PORTABLE CHEST - 1 VIEW  COMPARISON:  Chest CT 12/2013 and earlier.  FINDINGS: Portable AP semi upright view at at 1054 hrs. Right IJ approach dual lumen dialysis catheter. Catheter tips project at the level of the lower SVC and at or just above the cavoatrial junction. No pneumothorax. Mildly lower lung volumes. Stable cardiac size and mediastinal contours. Visualized tracheal air column is within normal limits. Pulmonary vascular congestion without overt edema. No effusion or consolidation identified.  IMPRESSION: 1. Right IJ approach dialysis catheter placed with no pneumothorax. 2. Lower lung volumes.  Vascular congestion without overt edema.   Electronically Signed   By: Lars Pinks M.D.   On: 01/07/2014 11:12    Scheduled Meds: . antiseptic oral rinse  15 mL Mouth Rinse BID  . aspirin  325 mg Oral Daily  . carvedilol  6.25 mg Oral BID WC  . cefUROXime (ZINACEF)  IV  1.5 g Intravenous On Call to OR  . furosemide  40 mg Oral BID  . heparin  5,000 Units Subcutaneous 3 times per day  . insulin aspart protamine- aspart  12 Units Subcutaneous Q breakfast  . levofloxacin  500 mg Oral Q48H  . [START ON 01/10/2014] vancomycin  1,000 mg Intravenous To OR   Continuous Infusions: . sodium chloride 20 mL/hr at 01/07/14 [id number]    Principal Problem:   Acute on chronic kidney failure Active Problems:   Diabetes mellitus   Monoclonal gammopathies    Time spent: 20 min    [place] [facility] Pager [phone]. If 7PM-7AM, please contact night-coverage at [email or web address], password [facility] 01/07/2014, 5:47 PM  LOS: 3 days

## 2014-01-08 ENCOUNTER — Encounter (HOSPITAL_COMMUNITY): Payer: Self-pay | Admitting: Vascular Surgery

## 2014-01-08 ENCOUNTER — Inpatient Hospital Stay (HOSPITAL_COMMUNITY): Payer: Medicaid Other

## 2014-01-08 LAB — GLUCOSE, CAPILLARY
GLUCOSE-CAPILLARY: 117 mg/dL — AB (ref 70–99)
GLUCOSE-CAPILLARY: 131 mg/dL — AB (ref 70–99)
Glucose-Capillary: 142 mg/dL — ABNORMAL HIGH (ref 70–99)
Glucose-Capillary: 146 mg/dL — ABNORMAL HIGH (ref 70–99)

## 2014-01-08 LAB — RENAL FUNCTION PANEL
Albumin: 3 g/dL — ABNORMAL LOW (ref 3.5–5.2)
BUN: 64 mg/dL — AB (ref 6–23)
CALCIUM: 8.3 mg/dL — AB (ref 8.4–10.5)
CO2: 23 meq/L (ref 19–32)
Chloride: 99 mEq/L (ref 96–112)
Creatinine, Ser: 9.22 mg/dL — ABNORMAL HIGH (ref 0.50–1.35)
GFR calc Af Amer: 6 mL/min — ABNORMAL LOW (ref >90)
GFR calc non Af Amer: 6 mL/min — ABNORMAL LOW (ref >90)
GLUCOSE: 113 mg/dL — AB (ref 70–99)
PHOSPHORUS: 6.3 mg/dL — AB (ref 2.3–4.6)
POTASSIUM: 4.4 meq/L (ref 3.7–5.3)
SODIUM: 140 meq/L (ref 137–147)

## 2014-01-08 LAB — VITAMIN D 25 HYDROXY (VIT D DEFICIENCY, FRACTURES): VIT D 25 HYDROXY: 16 ng/mL — AB (ref 30–89)

## 2014-01-08 MED ORDER — CALCITRIOL 0.5 MCG PO CAPS
0.5000 ug | ORAL_CAPSULE | Freq: Every day | ORAL | Status: DC
  Administered 2014-01-09 – 2014-01-10 (×2): 0.5 ug via ORAL
  Filled 2014-01-08 (×3): qty 1

## 2014-01-08 MED ORDER — CALCIUM CARBONATE ANTACID 500 MG PO CHEW
200.0000 mg | CHEWABLE_TABLET | Freq: Three times a day (TID) | ORAL | Status: DC
  Administered 2014-01-09 – 2014-01-10 (×3): 200 mg via ORAL
  Filled 2014-01-08 (×8): qty 1

## 2014-01-08 NOTE — Progress Notes (Signed)
[facility] ROUNDING NOTE   Subjective:   Interval History: no complaints   Objective:  Vital signs in last 24 hours:  Temp:  [98 F (36.7 C)-99 F (37.2 C)] 98 F (36.7 C) (02/24 1337) Pulse Rate:  [55-77] 66 (02/24 1337) Resp:  [20] 20 (02/24 1337) BP: (142-173)/(78-93) 152/85 mmHg (02/24 1337) SpO2:  [95 %-98 %] 96 % (02/24 1337) Weight:  [138.398 kg (305 lb 1.8 oz)-138.4 kg (305 lb 1.9 oz)] 138.398 kg (305 lb 1.8 oz) (02/23 2032)  Weight change: 2.109 kg (4 lb 10.4 oz) Filed Weights   01/07/14 1225 01/07/14 1504 01/07/14 2032  Weight: 145.4 kg (320 lb 8.8 oz) 138.4 kg (305 lb 1.9 oz) 138.398 kg (305 lb 1.8 oz)    Intake/Output: I/O last 3 completed shifts: In: [id number] [P.O.:840; I.V.:200] Out: 3600 [Urine:2600; Other:985; Blood:15]   Intake/Output this shift:  Total I/O In: 360 [P.O.:360] Out: -   CVS- RRR RS- CTA ABD- BS present soft non-distended EXT- no edema   Basic Metabolic Panel:  Recent Labs Lab 01/04/14 1649 01/05/14 0358 01/06/14 0845 01/07/14 0647 01/08/14 0519  NA 139 137 138 139 140  K 5.3 5.8* 5.5* 5.0 4.4  CL 104 103 100 102 99  CO2 19 18* 21 19 23   GLUCOSE 149* 144* 160* 108* 113*  BUN 69* 73* 77* 78* 64*  CREATININE 9.41* 9.97* 10.80* 10.81* 9.22*  CALCIUM 8.5 8.5 8.6 8.2* 8.3*  PHOS  --   --   --  7.3* 6.3*    Liver Function Tests:  Recent Labs Lab 01/07/14 0647 01/08/14 0519  ALBUMIN 3.0* 3.0*   No results found for this basename: LIPASE, AMYLASE,  in the last 168 hours No results found for this basename: AMMONIA,  in the last 168 hours  CBC:  Recent Labs Lab 01/04/14 1649 01/05/14 0358  WBC 5.9 7.6  HGB 8.9* 8.9*  HCT 29.4* 29.1*  MCV 75.8* 75.8*  PLT 225 228    Cardiac Enzymes: No results found for this basename: CKTOTAL, CKMB, CKMBINDEX, TROPONINI,  in the last 168 hours  BNP: No components found with this basename: POCBNP,   CBG:  Recent Labs Lab 01/07/14 1147 01/07/14 1747  01/07/14 2032 01/08/14 0749 01/08/14 1205  GLUCAP 122* 151* 153* 117* 142*    Microbiology: Results for orders placed during the hospital encounter of 01/04/14  CULTURE, BLOOD (ROUTINE X 2)     Status: None   Collection Time    01/04/14  7:29 PM      Result Value Ref Range Status   Specimen Description BLOOD ARM LEFT   Final   Special Requests BOTTLES DRAWN AEROBIC AND ANAEROBIC 5CC   Final   Culture  Setup Time     Final   Value: 01/05/2014 00:56     Performed at [facility]   Culture     Final   Value:        BLOOD CULTURE RECEIVED NO GROWTH TO DATE CULTURE WILL BE HELD FOR 5 DAYS BEFORE ISSUING A FINAL NEGATIVE REPORT     Performed at [facility]   Report Status PENDING   Incomplete  CULTURE, BLOOD (ROUTINE X 2)     Status: None   Collection Time    01/04/14  7:36 PM      Result Value Ref Range Status   Specimen Description BLOOD ARM RIGHT   Final   Special Requests BOTTLES DRAWN AEROBIC AND ANAEROBIC 5CC   Final  Culture  Setup Time     Final   Value: 01/05/2014 00:55     Performed at [facility]   Culture     Final   Value:        BLOOD CULTURE RECEIVED NO GROWTH TO DATE CULTURE WILL BE HELD FOR 5 DAYS BEFORE ISSUING A FINAL NEGATIVE REPORT     Performed at [facility]   Report Status PENDING   Incomplete  SURGICAL PCR SCREEN     Status: Abnormal   Collection Time    01/07/14  5:25 PM      Result Value Ref Range Status   MRSA, PCR NEGATIVE  NEGATIVE Final   Staphylococcus aureus POSITIVE (*) NEGATIVE Final   Comment:            The Xpert SA Assay (FDA     approved for NASAL specimens     in patients over 47 years of age),     is one component of     a comprehensive surveillance     program.  Test performance has     been validated by [name] for patients greater     than or equal to 22 year old.     It is not intended     to diagnose infection nor to     guide or monitor treatment.    Coagulation Studies: No  results found for this basename: LABPROT, INR,  in the last 72 hours  Urinalysis: No results found for this basename: COLORURINE, APPERANCEUR, LABSPEC, PHURINE, GLUCOSEU, HGBUR, BILIRUBINUR, KETONESUR, PROTEINUR, UROBILINOGEN, NITRITE, LEUKOCYTESUR,  in the last 72 hours    Imaging: Dg Chest Port 1 View  01/07/2014   CLINICAL DATA:  58 year old male status post dialysis catheter placement. Initial encounter.  EXAM: PORTABLE CHEST - 1 VIEW  COMPARISON:  Chest CT 12/2013 and earlier.  FINDINGS: Portable AP semi upright view at at 1054 hrs. Right IJ approach dual lumen dialysis catheter. Catheter tips project at the level of the lower SVC and at or just above the cavoatrial junction. No pneumothorax. Mildly lower lung volumes. Stable cardiac size and mediastinal contours. Visualized tracheal air column is within normal limits. Pulmonary vascular congestion without overt edema. No effusion or consolidation identified.  IMPRESSION: 1. Right IJ approach dialysis catheter placed with no pneumothorax. 2. Lower lung volumes.  Vascular congestion without overt edema.   Electronically Signed   By: Lars Pinks M.D.   On: 01/07/2014 11:12   Dg Fluoro Guide Cv Line-no Report  01/07/2014   CLINICAL DATA: dialysis catheter   FLOURO GUIDE CV LINE  Fluoroscopy was utilized by the requesting physician.  No radiographic  interpretation.      Medications:   . sodium chloride 20 mL/hr at 01/07/14 0936   . antiseptic oral rinse  15 mL Mouth Rinse BID  . aspirin  325 mg Oral Daily  . carvedilol  6.25 mg Oral BID WC  . furosemide  40 mg Oral BID  . heparin  5,000 Units Subcutaneous 3 times per day  . insulin aspart protamine- aspart  12 Units Subcutaneous Q breakfast  . levofloxacin  500 mg Oral Q48H  . [START ON 01/10/2014] vancomycin  1,000 mg Intravenous To OR  . zolpidem  5 mg Oral Once   oxyCODONE  Assessment/ Plan:   1. ESRD secondary to Diabetic nephrosclerosis CLIP process  2. Hyperkalemia- resolved  with dialysis  3. Metabolic acidosis- resolved  4. MGUS-  recommend Heme - Onc follow up  5. HTN- variable. Improved with volume control  6. Anemia- ESA and   Iron  7. RUL densities on CT scan- worrisome for bronchogenic carcinoma recommend pulmonary appointment  8. MBD-  PTH greater than 1000  Phos greater than 6   Start binders and Vit D  9. Dispo- will need SW consult to help with financial issues and dialysis clearance     LOS: 4 [name] W @TODAY @2 :13 PM

## 2014-01-08 NOTE — Progress Notes (Signed)
Painful darkened spot above HD catheter noted in PUP rounds.  Dr. Web notified and stated that this is possibly related to lidocaine injection site.  Will continue to monitor.

## 2014-01-08 NOTE — Progress Notes (Signed)
[facility] PROGRESS NOTE  [name] [id number] DOB: [birth year] DOA: 01/04/2014 PCP: Gara Kroner, MD  Assessment/Plan:  1. Community acquired pneumonia- CXR  Showed upper lobe pneumonia, on Levaquin . 2. Acute on CKD- Patient getting HD today.Discussed with Dr Justin Mend, patient is scheduled to get fistula on Thursday, and can be discharged after that. 3. MGUS- Patient was diagnosed in 2013, and was noncompliant with follow up. Will need to follow up hematology/oncology as outpatient 4. Pulmonary nodule- CT scan chest shows right apical 6 mm nodule, will need repeat CT scan in 6 months.Follow up Pulmonary as outpatient. 5. Left hand numbness- ? Cause, Does not involve whole arm or leg,  it is intermittent and lasts only for few minutes. Doubt TIA or CVA. Will check CT head, if negative and symptoms persist consider MRI brain.   Code Status: Full code Family Communication: *No family at bedside Disposition Plan: *home when stable   Consultants:  None  Procedures:  none  Antibiotics:  *levaquin  HPI/Subjective: 58 y.o. male who presents to the ED with 3 day history of worsening cough, productive of whitish sputum; SOB, worsening peripheral edema. Patient has h/o severe CKD but isnt very compliant with follow up. After extensive review of history and meds with family, ultimately it is determined that the patient ran out of his lasix in December, and despite prodding from family members, did not follow up with nephrology to get this refilled  Patient seen and examined, complains of intermittent numbness in left hand and lateral part of left thigh. Symptoms generally last for few minutes.  Objective: Filed Vitals:   01/08/14 1337  BP: 152/85  Pulse: 66  Temp: 98 F (36.7 C)  Resp: 20    Intake/Output Summary (Last 24 hours) at 01/08/14 1705 Last data filed at 01/08/14 1300  Gross per 24 hour  Intake    840 ml  Output    650 ml  Net    190 ml   Filed Weights    01/07/14 1225 01/07/14 1504 01/07/14 2032  Weight: 145.4 kg (320 lb 8.8 oz) 138.4 kg (305 lb 1.9 oz) 138.398 kg (305 lb 1.8 oz)    Exam:  Physical Exam: Head: Normocephalic, atraumatic.  Eyes: No signs of jaundice, EOMI Nose: Mucous membranes dry.  Throat: Oropharynx nonerythematous, no exudate appreciated.  Neck: supple,No deformities, masses, or tenderness noted. Lungs: Normal respiratory effort. B/L Clear to auscultation, no crackles or wheezes.  Heart: Regular RR. S1 and S2 normal  Abdomen: BS normoactive. Soft, Nondistended, non-tender.  Extremities: No pretibial edema, no erythema   Data Reviewed: Basic Metabolic Panel:  Recent Labs Lab 01/04/14 1649 01/05/14 0358 01/06/14 0845 01/07/14 0647 01/08/14 0519  NA 139 137 138 139 140  K 5.3 5.8* 5.5* 5.0 4.4  CL 104 103 100 102 99  CO2 19 18* 21 19 23   GLUCOSE 149* 144* 160* 108* 113*  BUN 69* 73* 77* 78* 64*  CREATININE 9.41* 9.97* 10.80* 10.81* 9.22*  CALCIUM 8.5 8.5 8.6 8.2* 8.3*  PHOS  --   --   --  7.3* 6.3*   Liver Function Tests:  Recent Labs Lab 01/07/14 0647 01/08/14 0519  ALBUMIN 3.0* 3.0*   No results found for this basename: LIPASE, AMYLASE,  in the last 168 hours No results found for this basename: AMMONIA,  in the last 168 hours CBC:  Recent Labs Lab 01/04/14 1649 01/05/14 0358  WBC 5.9 7.6  HGB 8.9* 8.9*  HCT 29.4* 29.1*  MCV  75.8* 75.8*  PLT 225 228   Cardiac Enzymes: No results found for this basename: CKTOTAL, CKMB, CKMBINDEX, TROPONINI,  in the last 168 hours BNP (last 3 results)  Recent Labs  01/04/14 1649  PROBNP 2553.0*   CBG:  Recent Labs Lab 01/07/14 1147 01/07/14 1747 01/07/14 2032 01/08/14 0749 01/08/14 1205  GLUCAP 122* 151* 153* 117* 142*    Recent Results (from the past 240 hour(s))  CULTURE, BLOOD (ROUTINE X 2)     Status: None   Collection Time    01/04/14  7:29 PM      Result Value Ref Range Status   Specimen Description BLOOD ARM LEFT   Final    Special Requests BOTTLES DRAWN AEROBIC AND ANAEROBIC 5CC   Final   Culture  Setup Time     Final   Value: 01/05/2014 00:56     Performed at [facility]   Culture     Final   Value:        BLOOD CULTURE RECEIVED NO GROWTH TO DATE CULTURE WILL BE HELD FOR 5 DAYS BEFORE ISSUING A FINAL NEGATIVE REPORT     Performed at [facility]   Report Status PENDING   Incomplete  CULTURE, BLOOD (ROUTINE X 2)     Status: None   Collection Time    01/04/14  7:36 PM      Result Value Ref Range Status   Specimen Description BLOOD ARM RIGHT   Final   Special Requests BOTTLES DRAWN AEROBIC AND ANAEROBIC 5CC   Final   Culture  Setup Time     Final   Value: 01/05/2014 00:55     Performed at [facility]   Culture     Final   Value:        BLOOD CULTURE RECEIVED NO GROWTH TO DATE CULTURE WILL BE HELD FOR 5 DAYS BEFORE ISSUING A FINAL NEGATIVE REPORT     Performed at [facility]   Report Status PENDING   Incomplete  SURGICAL PCR SCREEN     Status: Abnormal   Collection Time    01/07/14  5:25 PM      Result Value Ref Range Status   MRSA, PCR NEGATIVE  NEGATIVE Final   Staphylococcus aureus POSITIVE (*) NEGATIVE Final   Comment:            The Xpert SA Assay (FDA     approved for NASAL specimens     in patients over 32 years of age),     is one component of     a comprehensive surveillance     program.  Test performance has     been validated by [name] for patients greater     than or equal to 64 year old.     It is not intended     to diagnose infection nor to     guide or monitor treatment.     Studies: Dg Chest Port 1 View  01/07/2014   CLINICAL DATA:  58 year old male status post dialysis catheter placement. Initial encounter.  EXAM: PORTABLE CHEST - 1 VIEW  COMPARISON:  Chest CT 12/2013 and earlier.  FINDINGS: Portable AP semi upright view at at 1054 hrs. Right IJ approach dual lumen dialysis catheter. Catheter tips project at the level of the lower  SVC and at or just above the cavoatrial junction. No pneumothorax. Mildly lower lung volumes. Stable cardiac size and mediastinal contours. Visualized tracheal air  column is within normal limits. Pulmonary vascular congestion without overt edema. No effusion or consolidation identified.  IMPRESSION: 1. Right IJ approach dialysis catheter placed with no pneumothorax. 2. Lower lung volumes.  Vascular congestion without overt edema.   Electronically Signed   By: Lars Pinks M.D.   On: 01/07/2014 11:12   Dg Fluoro Guide Cv Line-no Report  01/07/2014   CLINICAL DATA: dialysis catheter   FLOURO GUIDE CV LINE  Fluoroscopy was utilized by the requesting physician.  No radiographic  interpretation.     Scheduled Meds: . antiseptic oral rinse  15 mL Mouth Rinse BID  . aspirin  325 mg Oral Daily  . calcitRIOL  0.5 mcg Oral Daily  . calcium carbonate  200 mg of elemental calcium Oral TID WC  . carvedilol  6.25 mg Oral BID WC  . heparin  5,000 Units Subcutaneous 3 times per day  . insulin aspart protamine- aspart  12 Units Subcutaneous Q breakfast  . levofloxacin  500 mg Oral Q48H  . [START ON 01/10/2014] vancomycin  1,000 mg Intravenous To OR  . zolpidem  5 mg Oral Once   Continuous Infusions: . sodium chloride 20 mL/hr at 01/07/14 [id number]    Principal Problem:   Acute on chronic kidney failure Active Problems:   Diabetes mellitus   Monoclonal gammopathies    Time spent: 20 min    [facility] Pager [phone]. If 7PM-7AM, please contact night-coverage at [email or web address], password [facility] 01/08/2014, 5:05 PM  LOS: 4 days

## 2014-01-08 NOTE — Progress Notes (Signed)
ANTIBIOTIC CONSULT NOTE - FOLLOW-UP  Pharmacy Consult for Levofloxacin Indication: CAP  Allergies  Allergen Reactions  . Penicillins Nausea And Vomiting and Other (See Comments)    Stomach pains also  . Vioxx [Rofecoxib] Nausea Only and Other (See Comments)    Gi upset.    Patient Measurements: Height: 6\' 1"  (185.4 cm) Weight: 305 lb 1.8 oz (138.398 kg) IBW/kg (Calculated) : 79.9  Vital Signs: Temp: 98 F (36.7 C) (02/24 1337) Temp src: Oral (02/24 1337) BP: 152/85 mmHg (02/24 1337) Pulse Rate: 66 (02/24 1337) Intake/Output from previous day: 02/23 0701 - 02/24 0700 In: 680 [P.O.:480; I.V.:200] Out: 2300 [Urine:1300; Blood:15] Intake/Output from this shift: Total I/O In: 360 [P.O.:360] Out: -   Labs:  Recent Labs  01/06/14 0845 01/07/14 0647 01/08/14 0519  CREATININE 10.80* 10.81* 9.22*   Estimated Creatinine Clearance: 13.1 ml/min (by C-G formula based on Cr of 9.22).    Microbiology: Recent Results (from the past 720 hour(s))  CULTURE, BLOOD (ROUTINE X 2)     Status: None   Collection Time    01/04/14  7:29 PM      Result Value Ref Range Status   Specimen Description BLOOD ARM LEFT   Final   Special Requests BOTTLES DRAWN AEROBIC AND ANAEROBIC 5CC   Final   Culture  Setup Time     Final   Value: 01/05/2014 00:56     Performed at [facility]   Culture     Final   Value:        BLOOD CULTURE RECEIVED NO GROWTH TO DATE CULTURE WILL BE HELD FOR 5 DAYS BEFORE ISSUING A FINAL NEGATIVE REPORT     Performed at [facility]   Report Status PENDING   Incomplete  CULTURE, BLOOD (ROUTINE X 2)     Status: None   Collection Time    01/04/14  7:36 PM      Result Value Ref Range Status   Specimen Description BLOOD ARM RIGHT   Final   Special Requests BOTTLES DRAWN AEROBIC AND ANAEROBIC 5CC   Final   Culture  Setup Time     Final   Value: 01/05/2014 00:55     Performed at [facility]   Culture     Final   Value:        BLOOD CULTURE  RECEIVED NO GROWTH TO DATE CULTURE WILL BE HELD FOR 5 DAYS BEFORE ISSUING A FINAL NEGATIVE REPORT     Performed at [facility]   Report Status PENDING   Incomplete  SURGICAL PCR SCREEN     Status: Abnormal   Collection Time    01/07/14  5:25 PM      Result Value Ref Range Status   MRSA, PCR NEGATIVE  NEGATIVE Final   Staphylococcus aureus POSITIVE (*) NEGATIVE Final   Comment:            The Xpert SA Assay (FDA     approved for NASAL specimens     in patients over 27 years of age),     is one component of     a comprehensive surveillance     program.  Test performance has     been validated by [name] for patients greater     than or equal to 72 year old.     It is not intended     to diagnose infection nor to     guide or monitor treatment.  Medical History: Past Medical History  Diagnosis Date  . Diabetes mellitus   . Hypertension   . Hyperlipidemia   . Gout   . Alcohol abuse   . Tobacco abuse   . Renal insufficiency   . Congestive heart disease 12/2011  . Hyperlipidemia   . Monoclonal gammopathies 02/25/2012  . Pedal edema    Assessment: 58 yo male admitted on 2/20 with 3 day history of worsening cough, productive sputum, SOB, and worsening peripheral edema. Pharmacy has been consulted to dose levofloxacin for CAP. CT showed upper lobe PNA. WBC wnl, afebrile. Patient has acute on chronic kidney disease. Scr 9.97 and CrCl ~ 12 mL/min.  Will be new start to HD this admission.  First HD was 2/23.   Goal of Therapy:  Eradication of infection  Plan:  - Continue Levaquin 500mg  q48h - Anticipate no further dose adjustments needed, Rx will sign off. - Recommended length of therapy is 8 days for CAP. - Consider changing to Levaquin PO when able.  [employer], Pharm.D., BCPS Clinical Pharmacist Pager [phone] 01/08/2014 2:49 PM

## 2014-01-08 NOTE — Progress Notes (Signed)
Utilization review completed.  

## 2014-01-08 NOTE — Progress Notes (Signed)
01/08/2014 3:20 PM Hemodialysis Outpatient Note; This patient has been accepted at the [facility] dialysis center on a Tuesday, Thursday and Saturday 2nd shift schedule. The center is able to begin treatment on Thursday February 26,2015 at 11:00.The center wants confirmation that a medicaid application has in fact been initiated( I have left a message with financial counselor Velna Hatchet and am waiting on a return call). Thank you. [employer]

## 2014-01-09 LAB — PROTEIN ELECTROPHORESIS, SERUM
ALBUMIN ELP: 49.1 % — AB (ref 55.8–66.1)
ALPHA-1-GLOBULIN: 7.7 % — AB (ref 2.9–4.9)
ALPHA-2-GLOBULIN: 14.7 % — AB (ref 7.1–11.8)
BETA GLOBULIN: 5.5 % (ref 4.7–7.2)
Beta 2: 6.9 % — ABNORMAL HIGH (ref 3.2–6.5)
Gamma Globulin: 16.1 % (ref 11.1–18.8)
M-Spike, %: 0.52 g/dL
Total Protein ELP: 5.7 g/dL — ABNORMAL LOW (ref 6.0–8.3)

## 2014-01-09 LAB — RENAL FUNCTION PANEL
Albumin: 3 g/dL — ABNORMAL LOW (ref 3.5–5.2)
BUN: 69 mg/dL — AB (ref 6–23)
CALCIUM: 8.6 mg/dL (ref 8.4–10.5)
CO2: 23 mEq/L (ref 19–32)
CREATININE: 10.2 mg/dL — AB (ref 0.50–1.35)
Chloride: 104 mEq/L (ref 96–112)
GFR calc Af Amer: 6 mL/min — ABNORMAL LOW (ref >90)
GFR calc non Af Amer: 5 mL/min — ABNORMAL LOW (ref >90)
Glucose, Bld: 123 mg/dL — ABNORMAL HIGH (ref 70–99)
Phosphorus: 5.6 mg/dL — ABNORMAL HIGH (ref 2.3–4.6)
Potassium: 4.9 mEq/L (ref 3.7–5.3)
Sodium: 142 mEq/L (ref 137–147)

## 2014-01-09 LAB — CBC
HEMATOCRIT: 29 % — AB (ref 39.0–52.0)
Hemoglobin: 9 g/dL — ABNORMAL LOW (ref 13.0–17.0)
MCH: 23 pg — ABNORMAL LOW (ref 26.0–34.0)
MCHC: 31 g/dL (ref 30.0–36.0)
MCV: 74 fL — AB (ref 78.0–100.0)
PLATELETS: 236 10*3/uL (ref 150–400)
RBC: 3.92 MIL/uL — ABNORMAL LOW (ref 4.22–5.81)
RDW: 15.4 % (ref 11.5–15.5)
WBC: 7.1 10*3/uL (ref 4.0–10.5)

## 2014-01-09 LAB — KAPPA/LAMBDA LIGHT CHAINS
Kappa free light chain: 9.38 mg/dL — ABNORMAL HIGH (ref 0.33–1.94)
Kappa, lambda light chain ratio: 1.88 — ABNORMAL HIGH (ref 0.26–1.65)
Lambda free light chains: 5 mg/dL — ABNORMAL HIGH (ref 0.57–2.63)

## 2014-01-09 LAB — IMMUNOFIXATION ELECTROPHORESIS
IgA: 197 mg/dL (ref 68–379)
IgG (Immunoglobin G), Serum: 934 mg/dL (ref 650–1600)
IgM, Serum: 82 mg/dL (ref 41–251)
TOTAL PROTEIN ELP: 6.2 g/dL (ref 6.0–8.3)

## 2014-01-09 LAB — GLUCOSE, CAPILLARY
Glucose-Capillary: 130 mg/dL — ABNORMAL HIGH (ref 70–99)
Glucose-Capillary: 111 mg/dL — ABNORMAL HIGH (ref 70–99)
Glucose-Capillary: 133 mg/dL — ABNORMAL HIGH (ref 70–99)

## 2014-01-09 MED ORDER — LIDOCAINE-PRILOCAINE 2.5-2.5 % EX CREA: 1.0000 "application " | TOPICAL_CREAM | CUTANEOUS | Status: DC | PRN

## 2014-01-09 MED ORDER — HEPARIN SODIUM (PORCINE) 1000 UNIT/ML DIALYSIS: 1000.0000 [IU] | INTRAMUSCULAR | Status: DC | PRN

## 2014-01-09 MED ORDER — HEPARIN SODIUM (PORCINE) 1000 UNIT/ML DIALYSIS
1000.0000 [IU] | INTRAMUSCULAR | Status: DC | PRN
  Filled 2014-01-09: qty 1

## 2014-01-09 MED ORDER — SODIUM CHLORIDE 0.9 % IV SOLN: 100.0000 mL | INTRAVENOUS | Status: DC | PRN

## 2014-01-09 MED ORDER — PENTAFLUOROPROP-TETRAFLUOROETH EX AERO: 1.0000 "application " | INHALATION_SPRAY | CUTANEOUS | Status: DC | PRN

## 2014-01-09 MED ORDER — NEPRO/CARBSTEADY PO LIQD: 237.0000 mL | ORAL | Status: DC | PRN

## 2014-01-09 MED ORDER — ALTEPLASE 2 MG IJ SOLR
2.0000 mg | Freq: Once | INTRAMUSCULAR | Status: AC | PRN
  Filled 2014-01-09: qty 2

## 2014-01-09 MED ORDER — LIDOCAINE HCL (PF) 1 % IJ SOLN: 5.0000 mL | INTRAMUSCULAR | Status: DC | PRN

## 2014-01-09 MED ORDER — TRAZODONE HCL 50 MG PO TABS
50.0000 mg | ORAL_TABLET | Freq: Every evening | ORAL | Status: DC | PRN
  Administered 2014-01-09: 50 mg via ORAL
  Filled 2014-01-09: qty 1

## 2014-01-09 MED ORDER — DIPHENHYDRAMINE HCL 25 MG PO CAPS: 25.0000 mg | ORAL_CAPSULE | Freq: Every evening | ORAL | Status: DC | PRN

## 2014-01-09 MED ORDER — ALTEPLASE 2 MG IJ SOLR: 2.0000 mg | Freq: Once | INTRAMUSCULAR | Status: DC | PRN

## 2014-01-09 NOTE — Progress Notes (Signed)
[facility]                                                                              Patient Demographics  [name], is a 58 y.o. male, DOB - 1956/01/20, [id number]  Admit date - 01/04/2014   Admitting Physician Etta Quill, DO  Outpatient Primary MD for the patient is Nicholas Kroner, MD  LOS - 5   Chief Complaint  Patient presents with  . Shortness of Breath  . Abnormal Lab        Assessment & Plan   Community-acquired pneumonia -Chest x-ray showed upper lobe pneumonia -Will continue on Levaquin and vancomycin  End-stage renal disease -Secondary to diabetic nephrosclerosis -Nephrology consulted and following -Patient will receive a fistula tomorrow, 01/10/2014, by vascular surgery -Continue rocaltrol, calcium carbonate  MGUS -Patient was diagnosed in 2013, was noncompliant with followup -Will need outpatient hematology oncology followup  Pulmonary nodule -Seen on CT of the chest, right apical 6 mm nodule -Will need outpatient follow up with pulmonary as well as repeat CT of the chest in 6 months  Left hand numbness -Secondary to unknown cause at this time, possibly positional -Numbness is intermittent and last a few minutes -CT head shows no acute intracranial abnormality  -B12 level 468  Hypertension -Not well-controlled, will continue Coreg  Code Status: Full  Family Communication: None at bedside  Disposition Plan: Admitted, will likely be discharged on 01/10/2014 after receiving fistula placement  Time Spent in minutes   30 minutes  Procedures  None  Consults   Nephrology Vascular surgery  DVT Prophylaxis heparin  Lab Results  Component Value Date   PLT 228 01/05/2014    Medications  Scheduled Meds: . antiseptic oral rinse  15 mL Mouth Rinse BID  . aspirin  325 mg Oral Daily  . calcitRIOL  0.5 mcg Oral Daily  . calcium carbonate  200 mg of elemental calcium Oral TID WC  . carvedilol  6.25 mg Oral BID WC  .  heparin  5,000 Units Subcutaneous 3 times per day  . insulin aspart protamine- aspart  12 Units Subcutaneous Q breakfast  . levofloxacin  500 mg Oral Q48H  . [START ON 01/10/2014] vancomycin  1,000 mg Intravenous To OR   Continuous Infusions: . sodium chloride 20 mL/hr at 01/07/14 0936   PRN Meds:.sodium chloride, sodium chloride, alteplase, feeding supplement (NEPRO CARB STEADY), heparin, lidocaine (PF), lidocaine-prilocaine, oxyCODONE, pentafluoroprop-tetrafluoroeth  Antibiotics    Anti-infectives   Start     Dose/Rate Route Frequency Ordered Stop   01/10/14 0800  vancomycin (VANCOCIN) IVPB 1000 mg/200 mL premix     1,000 mg 200 mL/hr over 60 Minutes Intravenous To Surgery 01/07/14 1627 01/11/14 0800   01/07/14 1000  levofloxacin (LEVAQUIN) tablet 500 mg     500 mg Oral Every 48 hours 01/05/14 0847     01/07/14 0600  cefUROXime (ZINACEF) 1.5 g in dextrose 5 % 50 mL IVPB     1.5 g 100 mL/hr over 30 Minutes Intravenous On call to O.R. 01/06/14 1445 01/08/14 0559   01/05/14 1000  levofloxacin (LEVAQUIN) tablet 750 mg     750 mg Oral  Once 01/05/14 0847 01/05/14 1152  01/04/14 1930  cefTRIAXone (ROCEPHIN) 1 g in dextrose 5 % 50 mL IVPB  Status:  Discontinued     1 g 100 mL/hr over 30 Minutes Intravenous  Once 01/04/14 1920 01/04/14 2012   01/04/14 1930  azithromycin (ZITHROMAX) 500 mg in dextrose 5 % 250 mL IVPB  Status:  Discontinued     500 mg 250 mL/hr over 60 Minutes Intravenous  Once 01/04/14 1920 01/04/14 2012        Subjective:   [name] seen and examined today.  Patient currently has no complaints this morning.  He states he is fearful and anxious about his situation and getting a fistula.  Objective:   Filed Vitals:   01/09/14 0817 01/09/14 1148 01/09/14 1205 01/09/14 1210  BP: 170/85 174/73 163/91 167/96  Pulse: 106 77 72 70  Temp: 98.1 F (36.7 C) 98.2 F (36.8 C)    TempSrc: Oral Oral    Resp: 20 18    Height:      Weight:  143.5 kg (316 lb 5.8 oz)     SpO2: 96% 92%      Wt Readings from Last 3 Encounters:  01/09/14 143.5 kg (316 lb 5.8 oz)  01/09/14 143.5 kg (316 lb 5.8 oz)  03/02/12 133.63 kg (294 lb 9.6 oz)     Intake/Output Summary (Last 24 hours) at 01/09/14 1216 Last data filed at 01/09/14 1023  Gross per 24 hour  Intake    720 ml  Output   1400 ml  Net   -680 ml    Exam  General: Well developed, well nourished, NAD, appears stated age  HEENT: NCAT, PERRLA, EOMI, Anicteic Sclera, mucous membranes moist.   Neck: Supple, no JVD, no masses  Cardiovascular: S1 S2 auscultated, no rubs, murmurs or gallops. Regular rate and rhythm.  Respiratory: Clear to auscultation bilaterally with equal chest rise  Abdomen: Soft, obese, nontender, nondistended, + bowel sounds  Extremities: warm dry without cyanosis clubbing or edema  Neuro: AAOx3, cranial nerves grossly intact.   Skin: Without rashes exudates or nodules  Psych: Normal affect and demeanor with intact judgement and insight, anxious  Data Review   Micro Results Recent Results (from the past 240 hour(s))  CULTURE, BLOOD (ROUTINE X 2)     Status: None   Collection Time    01/04/14  7:29 PM      Result Value Ref Range Status   Specimen Description BLOOD ARM LEFT   Final   Special Requests BOTTLES DRAWN AEROBIC AND ANAEROBIC 5CC   Final   Culture  Setup Time     Final   Value: 01/05/2014 00:56     Performed at [facility]   Culture     Final   Value:        BLOOD CULTURE RECEIVED NO GROWTH TO DATE CULTURE WILL BE HELD FOR 5 DAYS BEFORE ISSUING A FINAL NEGATIVE REPORT     Performed at [facility]   Report Status PENDING   Incomplete  CULTURE, BLOOD (ROUTINE X 2)     Status: None   Collection Time    01/04/14  7:36 PM      Result Value Ref Range Status   Specimen Description BLOOD ARM RIGHT   Final   Special Requests BOTTLES DRAWN AEROBIC AND ANAEROBIC 5CC   Final   Culture  Setup Time     Final   Value: 01/05/2014 00:55     Performed at  [facility]  Final   Value:        BLOOD CULTURE RECEIVED NO GROWTH TO DATE CULTURE WILL BE HELD FOR 5 DAYS BEFORE ISSUING A FINAL NEGATIVE REPORT     Performed at [facility]   Report Status PENDING   Incomplete  SURGICAL PCR SCREEN     Status: Abnormal   Collection Time    01/07/14  5:25 PM      Result Value Ref Range Status   MRSA, PCR NEGATIVE  NEGATIVE Final   Staphylococcus aureus POSITIVE (*) NEGATIVE Final   Comment:            The Xpert SA Assay (FDA     approved for NASAL specimens     in patients over 70 years of age),     is one component of     a comprehensive surveillance     program.  Test performance has     been validated by [name] for patients greater     than or equal to 43 year old.     It is not intended     to diagnose infection nor to     guide or monitor treatment.    Radiology Reports Dg Chest 2 View  01/04/2014   CLINICAL DATA:  Right-sided chest pain with chest congestion and shortness of breath.  EXAM: CHEST  2 VIEW  COMPARISON:  01/04/2014 and 01/10/2012 and 04/02/2008  FINDINGS: There is cardiomegaly. Pulmonary vascularity appears normal. There is a faint right perihilar infiltrate with fullness of the right hilum. There are no effusions. Left lung is clear. No acute osseous abnormality.  IMPRESSION: Right perihilar infiltrate with fullness in the right hilum and in the azygos region. This could represent pneumonia with adenopathy but the possibility of adenopathy and a mass with postobstructive infiltrate should be considered. Follow-up is recommended.   Electronically Signed   By: Rozetta Nunnery M.D.   On: 01/04/2014 19:10   Dg Chest 2 View  01/04/2014   CLINICAL DATA:  Bronchitis. Shortness of breath. History of congestive heart failure.  EXAM: CHEST  2 VIEW  COMPARISON:  01/10/2012.  04/02/2008.  FINDINGS: Cardiac silhouette is enlarged. The aorta is unfolded. There is extensive overlying soft tissue density. I do  not think there is any dense consolidation, collapse or a fusion. Difficulty completely rule out a patchy infiltrate in the right lung, though I favor this is overlying soft tissue. The right hilum is prominent, more so than seen on some of the older studies. Because of the question of right hilar enlargement and right pulmonary parenchymal density, I would suggest that a chest CT to ensure that we are not missing neoplastic disease. No effusions. No significant bony finding.  IMPRESSION: Cardiac enlargement.  Unfolded aorta up.  Question right pulmonary parenchymal density in the lower lung with enlarged right hilum. Whereas this could be due to inflammatory disease, consider chest CT to rule out neoplastic disease.   Electronically Signed   By: Nelson Chimes M.D.   On: 01/04/2014 11:47   Ct Head Wo Contrast  01/08/2014   CLINICAL DATA:  Left hand numbness.  EXAM: CT HEAD WITHOUT CONTRAST  TECHNIQUE: Contiguous axial images were obtained from the base of the skull through the vertex without intravenous contrast.  COMPARISON:  None.  FINDINGS: There is no evidence for acute hemorrhage, hydrocephalus, mass lesion, or abnormal extra-axial fluid collection. No definite CT evidence for acute infarction.  Mild chronic mucosal disease  is seen in the maxillary sinuses bilaterally. Small lipoma is visible in the right frontal scalp.  IMPRESSION: No acute intracranial abnormality.   Electronically Signed   By: Misty Stanley M.D.   On: 01/08/2014 18:23   Ct Chest Wo Contrast  01/04/2014   CLINICAL DATA:  Short of breath.  Right flank lower ribcage pain.  EXAM: CT CHEST WITHOUT CONTRAST  TECHNIQUE: Multidetector CT imaging of the chest was performed following the standard protocol without IV contrast.  COMPARISON:  Chest radiograph 01/04/2014  FINDINGS: No pleural effusion identified. Peripheral and basilar predominant interstitial reticulation is noted. Right upper lobe peribronchovascular densities are identified, image  24/series 3. A few scattered peribronchovascular densities are noted in the central right lower lobe. Right apical nodule is identified measuring 6 mm, image 10/series 3.  The heart size is mild to moderately enlarged. There is no pericardial effusion. Prominent mediastinal lymph nodes are identified. Left paratracheal lymph node measures 1.6 cm, image 19/series 2. Right paratracheal lymph node is equal to 1.2 cm, image 20/series 2. The sub- carinal lymph node measures 1.6 cm, image 28/series 2.  There is no axillary or supraclavicular adenopathy. Incidental imaging through the upper abdomen is unremarkable.  Review of the visualized bony structures demonstrates no aggressive lytic or sclerotic bone lesions.  IMPRESSION: 1. Right upper lobe peribronchovascular densities are favored to represent early pneumonia. Findings may account for the patient's right sided chest pain. 2. Right apical nodule measures 6 mm. If the patient is at high risk for bronchogenic carcinoma, follow-up chest CT at 6-12 months is recommended. If the patient is at low risk for bronchogenic carcinoma, follow-up chest CT at 12 months is recommended. This recommendation follows the consensus statement: Guidelines for Management of Small Pulmonary Nodules Detected on CT Scans: A Statement from the [place] as published in Radiology 2005;237:395-400. 3. Enlarged mediastinal lymph nodes are nonspecific. In the setting of infection it is conceivable that these could be reactive in etiology. Differential considerations include metastatic adenopathy or lymphoproliferative disorder.   Electronically Signed   By: Kerby Moors M.D.   On: 01/04/2014 20:27   US Renal  01/05/2014   CLINICAL DATA:  Acute on chronic kidney failure. Monoclonal gammopathy. Diabetes.  EXAM: RENAL/URINARY TRACT ULTRASOUND COMPLETE  COMPARISON:  01/11/2012  FINDINGS: Right Kidney:  Length: 12.2 cm. Diffusely increased parenchymal echogenicity which is increased  since previous exam. Several small renal cysts noted, but no masses identified. No evidence of hydronephrosis.  Left Kidney:  Length: 10.0 cm. Diffusely increased parenchymal echogenicity which is increased since previous study. No evidence of renal mass or hydronephrosis.  Bladder:  Appears normal for degree of bladder distention.  IMPRESSION: Diffusely increased renal parenchymal echogenicity, consistent with medical renal disease. No evidence of hydronephrosis.   Electronically Signed   By: Earle Gell M.D.   On: 01/05/2014 20:34   Dg Chest Port 1 View  01/07/2014   CLINICAL DATA:  58 year old male status post dialysis catheter placement. Initial encounter.  EXAM: PORTABLE CHEST - 1 VIEW  COMPARISON:  Chest CT 12/2013 and earlier.  FINDINGS: Portable AP semi upright view at at 1054 hrs. Right IJ approach dual lumen dialysis catheter. Catheter tips project at the level of the lower SVC and at or just above the cavoatrial junction. No pneumothorax. Mildly lower lung volumes. Stable cardiac size and mediastinal contours. Visualized tracheal air column is within normal limits. Pulmonary vascular congestion without overt edema. No effusion or consolidation identified.  IMPRESSION: 1. Right IJ  approach dialysis catheter placed with no pneumothorax. 2. Lower lung volumes.  Vascular congestion without overt edema.   Electronically Signed   By: Lars Pinks M.D.   On: 01/07/2014 11:12   Dg Fluoro Guide Cv Line-no Report  01/07/2014   CLINICAL DATA: dialysis catheter   FLOURO GUIDE CV LINE  Fluoroscopy was utilized by the requesting physician.  No radiographic  interpretation.     CBC  Recent Labs Lab 01/04/14 1649 01/05/14 0358  WBC 5.9 7.6  HGB 8.9* 8.9*  HCT 29.4* 29.1*  PLT 225 228  MCV 75.8* 75.8*  MCH 22.9* 23.2*  MCHC 30.3 30.6  RDW 15.9* 15.9*    Chemistries   Recent Labs Lab 01/04/14 1649 01/05/14 0358 01/06/14 0845 01/07/14 0647 01/08/14 0519  NA 139 137 138 139 140  K 5.3 5.8*  5.5* 5.0 4.4  CL 104 103 100 102 99  CO2 19 18* 21 19 23   GLUCOSE 149* 144* 160* 108* 113*  BUN 69* 73* 77* 78* 64*  CREATININE 9.41* 9.97* 10.80* 10.81* 9.22*  CALCIUM 8.5 8.5 8.6 8.2* 8.3*   ------------------------------------------------------------------------------------------------------------------ estimated creatinine clearance is 13.3 ml/min (by C-G formula based on Cr of 9.22). ------------------------------------------------------------------------------------------------------------------ No results found for this basename: HGBA1C,  in the last 72 hours ------------------------------------------------------------------------------------------------------------------ No results found for this basename: CHOL, HDL, LDLCALC, TRIG, CHOLHDL, LDLDIRECT,  in the last 72 hours ------------------------------------------------------------------------------------------------------------------ No results found for this basename: TSH, T4TOTAL, FREET3, T3FREE, THYROIDAB,  in the last 72 hours ------------------------------------------------------------------------------------------------------------------ No results found for this basename: VITAMINB12, FOLATE, FERRITIN, TIBC, IRON, RETICCTPCT,  in the last 72 hours  Coagulation profile No results found for this basename: INR, PROTIME,  in the last 168 hours  No results found for this basename: DDIMER,  in the last 72 hours  Cardiac Enzymes No results found for this basename: CK, CKMB, TROPONINI, MYOGLOBIN,  in the last 168 hours ------------------------------------------------------------------------------------------------------------------ No components found with this basename: POCBNP,     Kortny Lirette D.O. on 01/09/2014 at 12:16 PM  Between 7am to 7pm - Pager - [phone]  After 7pm go to [email or web address] - password [email or web address]  And look for the night coverage person covering for me after hours  [facility] Office   [phone]

## 2014-01-09 NOTE — Progress Notes (Signed)
[name] ROUNDING NOTE   Subjective:   Interval History: no complaints  Objective:  Vital signs in last 24 hours:  Temp:  [97.6 F (36.4 C)-98.2 F (36.8 C)] 98.1 F (36.7 C) (02/25 0817) Pulse Rate:  [64-106] 106 (02/25 0817) Resp:  [20] 20 (02/25 0817) BP: (144-181)/(85-127) 170/85 mmHg (02/25 0817) SpO2:  [95 %-99 %] 96 % (02/25 0817) Weight:  [138.398 kg (305 lb 1.8 oz)] 138.398 kg (305 lb 1.8 oz) (02/24 2109)  Weight change: -7.002 kg (-15 lb 7 oz) Filed Weights   01/07/14 1504 01/07/14 2032 01/08/14 2109  Weight: 138.4 kg (305 lb 1.9 oz) 138.398 kg (305 lb 1.8 oz) 138.398 kg (305 lb 1.8 oz)    Intake/Output: I/O last 3 completed shifts: In: 1320 [P.O.:1320] Out: [id number] [Urine:1650]   Intake/Output this shift:     CVS- RRR RS- CTA ABD- BS present soft non-distended EXT- no edema   Basic Metabolic Panel:  Recent Labs Lab 01/04/14 1649 01/05/14 0358 01/06/14 0845 01/07/14 0647 01/08/14 0519  NA 139 137 138 139 140  K 5.3 5.8* 5.5* 5.0 4.4  CL 104 103 100 102 99  CO2 19 18* 21 19 23   GLUCOSE 149* 144* 160* 108* 113*  BUN 69* 73* 77* 78* 64*  CREATININE 9.41* 9.97* 10.80* 10.81* 9.22*  CALCIUM 8.5 8.5 8.6 8.2* 8.3*  PHOS  --   --   --  7.3* 6.3*    Liver Function Tests:  Recent Labs Lab 01/07/14 0647 01/08/14 0519  ALBUMIN 3.0* 3.0*   No results found for this basename: LIPASE, AMYLASE,  in the last 168 hours No results found for this basename: AMMONIA,  in the last 168 hours  CBC:  Recent Labs Lab 01/04/14 1649 01/05/14 0358  WBC 5.9 7.6  HGB 8.9* 8.9*  HCT 29.4* 29.1*  MCV 75.8* 75.8*  PLT 225 228    Cardiac Enzymes: No results found for this basename: CKTOTAL, CKMB, CKMBINDEX, TROPONINI,  in the last 168 hours  BNP: No components found with this basename: POCBNP,   CBG:  Recent Labs Lab 01/08/14 0749 01/08/14 1205 01/08/14 1739 01/08/14 2115 01/09/14 0801  GLUCAP 117* 142* 131* 146* 130*     Microbiology: Results for orders placed during the hospital encounter of 01/04/14  CULTURE, BLOOD (ROUTINE X 2)     Status: None   Collection Time    01/04/14  7:29 PM      Result Value Ref Range Status   Specimen Description BLOOD ARM LEFT   Final   Special Requests BOTTLES DRAWN AEROBIC AND ANAEROBIC 5CC   Final   Culture  Setup Time     Final   Value: 01/05/2014 00:56     Performed at [facility]   Culture     Final   Value:        BLOOD CULTURE RECEIVED NO GROWTH TO DATE CULTURE WILL BE HELD FOR 5 DAYS BEFORE ISSUING A FINAL NEGATIVE REPORT     Performed at [facility]   Report Status PENDING   Incomplete  CULTURE, BLOOD (ROUTINE X 2)     Status: None   Collection Time    01/04/14  7:36 PM      Result Value Ref Range Status   Specimen Description BLOOD ARM RIGHT   Final   Special Requests BOTTLES DRAWN AEROBIC AND ANAEROBIC 5CC   Final   Culture  Setup Time     Final   Value: 01/05/2014 00:55  Performed at [facility]     Final   Value:        BLOOD CULTURE RECEIVED NO GROWTH TO DATE CULTURE WILL BE HELD FOR 5 DAYS BEFORE ISSUING A FINAL NEGATIVE REPORT     Performed at [facility]   Report Status PENDING   Incomplete  SURGICAL PCR SCREEN     Status: Abnormal   Collection Time    01/07/14  5:25 PM      Result Value Ref Range Status   MRSA, PCR NEGATIVE  NEGATIVE Final   Staphylococcus aureus POSITIVE (*) NEGATIVE Final   Comment:            The Xpert SA Assay (FDA     approved for NASAL specimens     in patients over 39 years of age),     is one component of     a comprehensive surveillance     program.  Test performance has     been validated by [name] for patients greater     than or equal to 70 year old.     It is not intended     to diagnose infection nor to     guide or monitor treatment.    Coagulation Studies: No results found for this basename: LABPROT, INR,  in the last 72  hours  Urinalysis: No results found for this basename: COLORURINE, APPERANCEUR, LABSPEC, PHURINE, GLUCOSEU, HGBUR, BILIRUBINUR, KETONESUR, PROTEINUR, UROBILINOGEN, NITRITE, LEUKOCYTESUR,  in the last 72 hours    Imaging: Ct Head Wo Contrast  01/08/2014   CLINICAL DATA:  Left hand numbness.  EXAM: CT HEAD WITHOUT CONTRAST  TECHNIQUE: Contiguous axial images were obtained from the base of the skull through the vertex without intravenous contrast.  COMPARISON:  None.  FINDINGS: There is no evidence for acute hemorrhage, hydrocephalus, mass lesion, or abnormal extra-axial fluid collection. No definite CT evidence for acute infarction.  Mild chronic mucosal disease is seen in the maxillary sinuses bilaterally. Small lipoma is visible in the right frontal scalp.  IMPRESSION: No acute intracranial abnormality.   Electronically Signed   By: Misty Stanley M.D.   On: 01/08/2014 18:23   Dg Chest Port 1 View  01/07/2014   CLINICAL DATA:  58 year old male status post dialysis catheter placement. Initial encounter.  EXAM: PORTABLE CHEST - 1 VIEW  COMPARISON:  Chest CT 12/2013 and earlier.  FINDINGS: Portable AP semi upright view at at 1054 hrs. Right IJ approach dual lumen dialysis catheter. Catheter tips project at the level of the lower SVC and at or just above the cavoatrial junction. No pneumothorax. Mildly lower lung volumes. Stable cardiac size and mediastinal contours. Visualized tracheal air column is within normal limits. Pulmonary vascular congestion without overt edema. No effusion or consolidation identified.  IMPRESSION: 1. Right IJ approach dialysis catheter placed with no pneumothorax. 2. Lower lung volumes.  Vascular congestion without overt edema.   Electronically Signed   By: Lars Pinks M.D.   On: 01/07/2014 11:12   Dg Fluoro Guide Cv Line-no Report  01/07/2014   CLINICAL DATA: dialysis catheter   FLOURO GUIDE CV LINE  Fluoroscopy was utilized by the requesting physician.  No radiographic   interpretation.      Medications:   . sodium chloride 20 mL/hr at 01/07/14 0936   . antiseptic oral rinse  15 mL Mouth Rinse BID  . aspirin  325 mg Oral Daily  . calcitRIOL  0.5 mcg Oral Daily  . calcium carbonate  200 mg of elemental calcium Oral TID WC  . carvedilol  6.25 mg Oral BID WC  . heparin  5,000 Units Subcutaneous 3 times per day  . insulin aspart protamine- aspart  12 Units Subcutaneous Q breakfast  . levofloxacin  500 mg Oral Q48H  . [START ON 01/10/2014] vancomycin  1,000 mg Intravenous To OR   oxyCODONE  Assessment/ Plan:  1. ESRD secondary to Diabetic nephrosclerosis outpatient [facility] 2. Hyperkalemia- resolved with dialysis 3. Metabolic acidosis- resolved 4. MGUS- recommend Heme - Onc follow up 5. HTN- variable. Improved with volume control 6. Anemia- ESA and Iron 7. RUL densities on CT scan- worrisome for bronchogenic carcinoma recommend pulmonary appointment 8. MBD- PTH greater than 1000 Phos greater than 6 Start binders and Vit D 9. Dispo- will need SW consult to help with financial issues and dialysis clearance 10. Fistula to be placed tomorrow may D/C after placement    LOS: 5 [name] @TODAY @9 :28 AM

## 2014-01-09 NOTE — Plan of Care (Signed)
Problem: Food- and Nutrition-Related Knowledge Deficit (NB-1.1) Goal: Nutrition education Formal process to instruct or train a patient/client in a skill or to impart knowledge to help patients/clients voluntarily manage or modify food choices and eating behavior to maintain or improve health.  Outcome: Completed/Met Date Met:  01/09/14 Nutrition Education Note  Dietetic Intern consulted for nutrition education regarding renal diet education for new HD patient.  Dietetic Intern provided "Make Me a Meal" renal diet education handbook and "Renal Snacks and Meals" handout. Dietetic Intern explained why it is important to follow the renal diet guidelines and discussed how fluids, sodium, phosphorous, and potassium are related to HD. Discussed foods to avoid. Dietetic intern encouraged intake of fruits and vegetables low in potassium. Discouraged intake of high sodium and high phosphorous foods. Dietetic intern explained what spices and seasonings are safe to use at home.   Patient and wife were receptive to education. Teach back method used.  Expect good compliance.  Body mass index is 41.75 kg/(m^2). Patient meets criteria for obesity class III based on current BMI.  Current diet order is Renal/CHO modified, patient is consuming approximately 65-100% of meals at this time. Laps and medications reviewed. No further nutrition interventions warranted at this time. If additional nutrition issues arise, please re-consult RD.  Ashley Bower, Dietetic Intern Pager: [phone]  I agree with the above information and made appropriate revisions.   MS, RD, LDN Inpatient Registered Dietitian Pager: [phone] After-hours pager: [phone]      

## 2014-01-09 NOTE — Progress Notes (Signed)
Procedure details discussed with pt AV fistula right arm tomorrow.  Ruta Hinds, MD [facility] Office: [phone] Pager: [phone]

## 2014-01-10 ENCOUNTER — Inpatient Hospital Stay (HOSPITAL_COMMUNITY): Payer: Medicaid Other | Admitting: Anesthesiology

## 2014-01-10 ENCOUNTER — Encounter (HOSPITAL_COMMUNITY): Payer: Self-pay | Admitting: Certified Registered Nurse Anesthetist

## 2014-01-10 ENCOUNTER — Encounter (HOSPITAL_COMMUNITY): Payer: Medicaid Other | Admitting: Anesthesiology

## 2014-01-10 ENCOUNTER — Telehealth: Payer: Self-pay | Admitting: Vascular Surgery

## 2014-01-10 ENCOUNTER — Encounter (HOSPITAL_COMMUNITY)
Admission: EM | Disposition: A | Discharge status: Home / Self Care | Payer: Self-pay | Source: Home / Self Care | Attending: Family Medicine

## 2014-01-10 ENCOUNTER — Other Ambulatory Visit: Payer: Self-pay | Admitting: *Deleted

## 2014-01-10 DIAGNOSIS — Z4931 Encounter for adequacy testing for hemodialysis: Secondary | ICD-10-CM

## 2014-01-10 DIAGNOSIS — N186 End stage renal disease: Secondary | ICD-10-CM

## 2014-01-10 HISTORY — PX: AV FISTULA PLACEMENT: SHX1204

## 2014-01-10 LAB — GLUCOSE, CAPILLARY
GLUCOSE-CAPILLARY: 159 mg/dL — AB (ref 70–99)
GLUCOSE-CAPILLARY: 160 mg/dL — AB (ref 70–99)
Glucose-Capillary: 153 mg/dL — ABNORMAL HIGH (ref 70–99)
Glucose-Capillary: 204 mg/dL — ABNORMAL HIGH (ref 70–99)

## 2014-01-10 LAB — UIFE/LIGHT CHAINS/TP QN, 24-HR UR
ALPHA 1 UR: DETECTED — AB
Albumin, U: DETECTED
Alpha 2, Urine: DETECTED — AB
Beta, Urine: DETECTED — AB
FREE KAPPA/LAMBDA RATIO: 8.06 ratio (ref 2.04–10.37)
Free Kappa Lt Chains,Ur: 44 mg/dL — ABNORMAL HIGH (ref 0.14–2.42)
Free Lambda Lt Chains,Ur: 5.46 mg/dL — ABNORMAL HIGH (ref 0.02–0.67)
Gamma Globulin, Urine: DETECTED — AB
TOTAL PROTEIN, URINE-UPE24: 303.6 mg/dL

## 2014-01-10 LAB — VITAMIN D 1,25 DIHYDROXY
Vitamin D 1, 25 (OH)2 Total: 23 pg/mL (ref 18–72)
Vitamin D2 1, 25 (OH)2: <8 pg/mL
Vitamin D3 1, 25 (OH)2: 23 pg/mL

## 2014-01-10 SURGERY — ARTERIOVENOUS (AV) FISTULA CREATION
Anesthesia: Monitor Anesthesia Care | Site: Arm Lower | Laterality: Right

## 2014-01-10 MED ORDER — OXYCODONE HCL 5 MG PO TABS: 5.0000 mg | ORAL_TABLET | Freq: Once | ORAL | Status: DC | PRN

## 2014-01-10 MED ORDER — 0.9 % SODIUM CHLORIDE (POUR BTL) OPTIME
TOPICAL | Status: DC | PRN
  Administered 2014-01-10: 1000 mL

## 2014-01-10 MED ORDER — PROTAMINE SULFATE 10 MG/ML IV SOLN
INTRAVENOUS | Status: DC | PRN
  Administered 2014-01-10: 10 mg via INTRAVENOUS
  Administered 2014-01-10: 30 mg via INTRAVENOUS

## 2014-01-10 MED ORDER — HEPARIN SODIUM (PORCINE) 1000 UNIT/ML IJ SOLN
INTRAMUSCULAR | Status: DC | PRN
  Administered 2014-01-10: 8 mL via INTRAVENOUS

## 2014-01-10 MED ORDER — PROPOFOL 10 MG/ML IV BOLUS
INTRAVENOUS | Status: AC
  Filled 2014-01-10: qty 20

## 2014-01-10 MED ORDER — THROMBIN 20000 UNITS EX SOLR
CUTANEOUS | Status: AC
  Filled 2014-01-10: qty 20000

## 2014-01-10 MED ORDER — CALCITRIOL 0.5 MCG PO CAPS: 0.5000 ug | ORAL_CAPSULE | Freq: Every day | ORAL | Status: DC

## 2014-01-10 MED ORDER — MIDAZOLAM HCL 5 MG/5ML IJ SOLN
INTRAMUSCULAR | Status: DC | PRN
  Administered 2014-01-10: 1 mg via INTRAVENOUS

## 2014-01-10 MED ORDER — FENTANYL CITRATE 0.05 MG/ML IJ SOLN
INTRAMUSCULAR | Status: DC | PRN
  Administered 2014-01-10 (×8): 25 ug via INTRAVENOUS

## 2014-01-10 MED ORDER — MORPHINE SULFATE 2 MG/ML IJ SOLN: 1.0000 mg | INTRAMUSCULAR | Status: DC | PRN

## 2014-01-10 MED ORDER — PROPOFOL INFUSION 10 MG/ML OPTIME
INTRAVENOUS | Status: DC | PRN
  Administered 2014-01-10: 25 ug/kg/min via INTRAVENOUS

## 2014-01-10 MED ORDER — FENTANYL CITRATE 0.05 MG/ML IJ SOLN
INTRAMUSCULAR | Status: AC
  Filled 2014-01-10: qty 5

## 2014-01-10 MED ORDER — PROPOFOL 10 MG/ML IV BOLUS
INTRAVENOUS | Status: DC | PRN
  Administered 2014-01-10: 20 mg via INTRAVENOUS
  Administered 2014-01-10 (×2): 10 mg via INTRAVENOUS

## 2014-01-10 MED ORDER — HEPARIN SODIUM (PORCINE) 1000 UNIT/ML IJ SOLN
INTRAMUSCULAR | Status: AC
  Filled 2014-01-10: qty 1

## 2014-01-10 MED ORDER — LIDOCAINE HCL (CARDIAC) 20 MG/ML IV SOLN
INTRAVENOUS | Status: DC | PRN
  Administered 2014-01-10 (×2): 50 mg via INTRAVENOUS

## 2014-01-10 MED ORDER — LIDOCAINE HCL (PF) 1 % IJ SOLN
INTRAMUSCULAR | Status: AC
  Filled 2014-01-10: qty 30

## 2014-01-10 MED ORDER — MIDAZOLAM HCL 2 MG/2ML IJ SOLN
INTRAMUSCULAR | Status: AC
  Filled 2014-01-10: qty 2

## 2014-01-10 MED ORDER — PROTAMINE SULFATE 10 MG/ML IV SOLN
INTRAVENOUS | Status: AC
  Filled 2014-01-10: qty 5

## 2014-01-10 MED ORDER — OXYCODONE HCL 5 MG/5ML PO SOLN: 5.0000 mg | Freq: Once | ORAL | Status: DC | PRN

## 2014-01-10 MED ORDER — PROMETHAZINE HCL 25 MG/ML IJ SOLN: 6.2500 mg | INTRAMUSCULAR | Status: DC | PRN

## 2014-01-10 MED ORDER — LIDOCAINE HCL (CARDIAC) 20 MG/ML IV SOLN
INTRAVENOUS | Status: AC
  Filled 2014-01-10: qty 5

## 2014-01-10 MED ORDER — TRAZODONE HCL 50 MG PO TABS: 50.0000 mg | ORAL_TABLET | Freq: Every evening | ORAL | Status: DC | PRN

## 2014-01-10 MED ORDER — SODIUM CHLORIDE 0.9 % IR SOLN
Status: DC | PRN
  Administered 2014-01-10: 08:00:00

## 2014-01-10 MED ORDER — SODIUM CHLORIDE 0.9 % IV SOLN
INTRAVENOUS | Status: DC | PRN
  Administered 2014-01-10 (×2): via INTRAVENOUS

## 2014-01-10 MED ORDER — LIDOCAINE HCL (PF) 1 % IJ SOLN
INTRAMUSCULAR | Status: DC | PRN
  Administered 2014-01-10: 11 mL

## 2014-01-10 MED ORDER — NEPRO/CARBSTEADY PO LIQD: 237.0000 mL | ORAL | Status: DC | PRN

## 2014-01-10 SURGICAL SUPPLY — 38 items
ARMBAND PINK RESTRICT EXTREMIT (MISCELLANEOUS) ×2 IMPLANT
CANISTER SUCTION 2500CC (MISCELLANEOUS) ×2 IMPLANT
CLIP TI MEDIUM 6 (CLIP) ×2 IMPLANT
CLIP TI WIDE RED SMALL 6 (CLIP) ×6 IMPLANT
COVER PROBE W GEL 5X96 (DRAPES) IMPLANT
COVER SURGICAL LIGHT HANDLE (MISCELLANEOUS) ×2 IMPLANT
DECANTER SPIKE VIAL GLASS SM (MISCELLANEOUS) ×2 IMPLANT
DERMABOND ADVANCED (GAUZE/BANDAGES/DRESSINGS) ×1
DERMABOND ADVANCED .7 DNX12 (GAUZE/BANDAGES/DRESSINGS) ×1 IMPLANT
DRAIN PENROSE 1/2X12 LTX STRL (WOUND CARE) IMPLANT
ELECT REM PT RETURN 9FT ADLT (ELECTROSURGICAL) ×2
ELECTRODE REM PT RTRN 9FT ADLT (ELECTROSURGICAL) ×1 IMPLANT
GLOVE BIO SURGEON STRL SZ 6.5 (GLOVE) ×4 IMPLANT
GLOVE BIO SURGEON STRL SZ7.5 (GLOVE) ×2 IMPLANT
GLOVE BIOGEL PI IND STRL 6.5 (GLOVE) ×1 IMPLANT
GLOVE BIOGEL PI IND STRL 7.0 (GLOVE) ×1 IMPLANT
GLOVE BIOGEL PI IND STRL 8 (GLOVE) ×2 IMPLANT
GLOVE BIOGEL PI INDICATOR 6.5 (GLOVE) ×1
GLOVE BIOGEL PI INDICATOR 7.0 (GLOVE) ×1
GLOVE BIOGEL PI INDICATOR 8 (GLOVE) ×2
GOWN STRL REUS W/ TWL LRG LVL3 (GOWN DISPOSABLE) ×3 IMPLANT
GOWN STRL REUS W/ TWL XL LVL3 (GOWN DISPOSABLE) ×1 IMPLANT
GOWN STRL REUS W/TWL LRG LVL3 (GOWN DISPOSABLE) ×3
GOWN STRL REUS W/TWL XL LVL3 (GOWN DISPOSABLE) ×1
KIT BASIN OR (CUSTOM PROCEDURE TRAY) ×2 IMPLANT
KIT ROOM TURNOVER OR (KITS) ×2 IMPLANT
NS IRRIG 1000ML POUR BTL (IV SOLUTION) ×2 IMPLANT
PACK CV ACCESS (CUSTOM PROCEDURE TRAY) ×2 IMPLANT
PAD ARMBOARD 7.5X6 YLW CONV (MISCELLANEOUS) ×4 IMPLANT
SPONGE SURGIFOAM ABS GEL 100 (HEMOSTASIS) IMPLANT
SUT PROLENE 6 0 BV (SUTURE) ×2 IMPLANT
SUT VIC AB 3-0 SH 27 (SUTURE) ×1
SUT VIC AB 3-0 SH 27X BRD (SUTURE) ×1 IMPLANT
SUT VICRYL 4-0 PS2 18IN ABS (SUTURE) ×2 IMPLANT
TOWEL OR 17X24 6PK STRL BLUE (TOWEL DISPOSABLE) ×2 IMPLANT
TOWEL OR 17X26 10 PK STRL BLUE (TOWEL DISPOSABLE) ×2 IMPLANT
UNDERPAD 30X30 INCONTINENT (UNDERPADS AND DIAPERS) ×2 IMPLANT
WATER STERILE IRR 1000ML POUR (IV SOLUTION) ×2 IMPLANT

## 2014-01-10 NOTE — Preoperative (Signed)
Beta Blockers   Reason not to administer Beta Blockers:Not Applicable, took last PM

## 2014-01-10 NOTE — Op Note (Signed)
    NAME: [name]    MRN: [id number] DOB: [birth year]    DATE OF OPERATION: 01/10/2014  PREOP DIAGNOSIS: Stage V chronic kidney disease  POSTOP DIAGNOSIS: Same  PROCEDURE: Right radiocephalic AV fistula  SURGEON: Judeth Cornfield. Scot Dock, MD, FACS  ASSIST: Leontine Locket, PA  ANESTHESIA: local with sedation   EBL: minimal  INDICATIONS: [name] is a 58 y.o. male who presents for new access.  FINDINGS: a 4 mm forearm cephalic vein  TECHNIQUE: Patient was taken to the operating room and sedated by anesthesia. The right upper extremity was prepped and draped in the usual sterile fashion. A curvilinear incision was made at the right wrist. This was done after the skin was anesthetized. The radial artery was dissected free beneath the fascia and had a good pulse and was free of disease. The cephalic vein was mobilized with branches divided between clips and 3-0 silk ties. It was ligated distally and irrigated out with heparinized saline. It was an approximately 4 mm vein. The patient was heparinized. The radial artery was clamped proximally and distally and a longitudinal arteriotomy was made. The vein was spatulated and sewn end-to-side to the artery using continuous 6-0 Prolene suture. At the completion there was an excellent thrill in the fistula. Heparin was partially reversed with protamine. The wound was closed with deep layer 3-0 Vicryl and the skin closed with 4-0 Vicryl. Dermabond was applied. Patient tolerated the procedure well and transferred to the recovery room in stable condition.  Deitra Mayo, MD, FACS [facility]  DATE OF DICTATION:   01/10/2014

## 2014-01-10 NOTE — Transfer of Care (Signed)
Immediate Anesthesia Transfer of Care Note  Patient: [name]  Procedure(s) Performed: Procedure(s): ARTERIOVENOUS (AV) FISTULA CREATION- RIGHT RADIOCEPHALIC  (Right)  Patient Location: PACU  Anesthesia Type:MAC  Level of Consciousness: awake, alert , oriented, patient cooperative and responds to stimulation  Airway & Oxygen Therapy: Patient Spontanous Breathing  Post-op Assessment: Report given to PACU RN, Post -op Vital signs reviewed and stable and Patient moving all extremities X 4  Post vital signs: Reviewed and stable  Complications: No apparent anesthesia complications

## 2014-01-10 NOTE — Anesthesia Preprocedure Evaluation (Addendum)
Anesthesia Evaluation  Patient identified by MRN, date of birth, ID band Patient awake    Reviewed: Allergy & Precautions, H&P , NPO status , Patient's Chart, lab work & pertinent test results, reviewed documented beta blocker date and time   Airway Mallampati: I TM Distance: >3 FB Neck ROM: Full    Dental  (+) Dental Advisory Given, Missing,    Pulmonary Current Smoker,  breath sounds clear to auscultation        Cardiovascular hypertension, Pt. on medications and Pt. on home beta blockers +CHF Rhythm:Regular Rate:Normal     Neuro/Psych    GI/Hepatic negative GI ROS,   Endo/Other  diabetesMorbid obesity  Renal/GU Renal disease     Musculoskeletal   Abdominal (+) + obese,   Peds  Hematology   Anesthesia Other Findings   Reproductive/Obstetrics                         Anesthesia Physical Anesthesia Plan  ASA: III  Anesthesia Plan: MAC   Post-op Pain Management:    Induction: Intravenous  Airway Management Planned: Natural Airway  Additional Equipment:   Intra-op Plan:   Post-operative Plan:   Informed Consent: I have reviewed the patients History and Physical, chart, labs and discussed the procedure including the risks, benefits and alternatives for the proposed anesthesia with the patient or authorized representative who has indicated his/her understanding and acceptance.     Plan Discussed with: CRNA, Surgeon and Anesthesiologist  Anesthesia Plan Comments:        Anesthesia Quick Evaluation

## 2014-01-10 NOTE — H&P (View-Only) (Signed)
Procedure details discussed with pt AV fistula right arm tomorrow.  Ruta Hinds, MD [facility] [place] Office: [phone] Pager: [phone]

## 2014-01-10 NOTE — Progress Notes (Signed)
Patient was discharged home with family. Patient was discharged with prescription information, dialysis information, and d/c education on ESRD and PNA. Patient was told to follow up with appointments and to call with questions/concerns. Patient was d/c with belongings and was stable upon discharge.

## 2014-01-10 NOTE — Progress Notes (Signed)
[facility] ROUNDING NOTE   Subjective:   Interval History: status post AVF this morning  Objective:  Vital signs in last 24 hours:  Temp:  [97.7 F (36.5 C)-98.4 F (36.9 C)] 97.8 F (36.6 C) (02/26 0930) Pulse Rate:  [67-114] 71 (02/26 0930) Resp:  [14-22] 22 (02/26 0930) BP: (128-174)/(71-103) 135/75 mmHg (02/26 0930) SpO2:  [92 %-100 %] 93 % (02/26 0930) Weight:  [140.207 kg (309 lb 1.6 oz)-143.5 kg (316 lb 5.8 oz)] 140.207 kg (309 lb 1.6 oz) (02/25 2226)  Weight change: 5.102 kg (11 lb 4 oz) Filed Weights   01/09/14 1148 01/09/14 1505 01/09/14 2226  Weight: 143.5 kg (316 lb 5.8 oz) 140.5 kg (309 lb 11.9 oz) 140.207 kg (309 lb 1.6 oz)    Intake/Output: I/O last 3 completed shifts: In: 840 [P.O.:840] Out: 5050 [Urine:2050; Other:3000]   Intake/Output this shift:  Total I/O In: 500 [I.V.:500] Out: -   CVS- RRR RS- CTA ABD- BS present soft non-distended EXT- no edema   Basic Metabolic Panel:  Recent Labs Lab 01/05/14 0358 01/06/14 0845 01/07/14 0647 01/08/14 0519 01/09/14 1223  NA 137 138 139 140 142  K 5.8* 5.5* 5.0 4.4 4.9  CL 103 100 102 99 104  CO2 18* 21 19 23 23   GLUCOSE 144* 160* 108* 113* 123*  BUN 73* 77* 78* 64* 69*  CREATININE 9.97* 10.80* 10.81* 9.22* 10.20*  CALCIUM 8.5 8.6 8.2* 8.3* 8.6  PHOS  --   --  7.3* 6.3* 5.6*    Liver Function Tests:  Recent Labs Lab 01/07/14 0647 01/08/14 0519 01/09/14 1223  ALBUMIN 3.0* 3.0* 3.0*   No results found for this basename: LIPASE, AMYLASE,  in the last 168 hours No results found for this basename: AMMONIA,  in the last 168 hours  CBC:  Recent Labs Lab 01/04/14 1649 01/05/14 0358 01/09/14 1223  WBC 5.9 7.6 7.1  HGB 8.9* 8.9* 9.0*  HCT 29.4* 29.1* 29.0*  MCV 75.8* 75.8* 74.0*  PLT 225 228 236    Cardiac Enzymes: No results found for this basename: CKTOTAL, CKMB, CKMBINDEX, TROPONINI,  in the last 168 hours  BNP: No components found with this basename: POCBNP,    CBG:  Recent Labs Lab 01/09/14 1550 01/09/14 2222 01/10/14 0645 01/10/14 0907 01/10/14 0946  GLUCAP 111* 133* 153* 159* 160*    Microbiology: Results for orders placed during the hospital encounter of 01/04/14  CULTURE, BLOOD (ROUTINE X 2)     Status: None   Collection Time    01/04/14  7:29 PM      Result Value Ref Range Status   Specimen Description BLOOD ARM LEFT   Final   Special Requests BOTTLES DRAWN AEROBIC AND ANAEROBIC 5CC   Final   Culture  Setup Time     Final   Value: 01/05/2014 00:56     Performed at [facility]   Culture     Final   Value:        BLOOD CULTURE RECEIVED NO GROWTH TO DATE CULTURE WILL BE HELD FOR 5 DAYS BEFORE ISSUING A FINAL NEGATIVE REPORT     Performed at [facility]   Report Status PENDING   Incomplete  CULTURE, BLOOD (ROUTINE X 2)     Status: None   Collection Time    01/04/14  7:36 PM      Result Value Ref Range Status   Specimen Description BLOOD ARM RIGHT   Final   Special Requests BOTTLES DRAWN  AEROBIC AND ANAEROBIC 5CC   Final   Culture  Setup Time     Final   Value: 01/05/2014 00:55     Performed at [facility]   Culture     Final   Value:        BLOOD CULTURE RECEIVED NO GROWTH TO DATE CULTURE WILL BE HELD FOR 5 DAYS BEFORE ISSUING A FINAL NEGATIVE REPORT     Performed at [facility]   Report Status PENDING   Incomplete  SURGICAL PCR SCREEN     Status: Abnormal   Collection Time    01/07/14  5:25 PM      Result Value Ref Range Status   MRSA, PCR NEGATIVE  NEGATIVE Final   Staphylococcus aureus POSITIVE (*) NEGATIVE Final   Comment:            The Xpert SA Assay (FDA     approved for NASAL specimens     in patients over 44 years of age),     is one component of     a comprehensive surveillance     program.  Test performance has     been validated by [name] for patients greater     than or equal to 55 year old.     It is not intended     to diagnose infection nor to      guide or monitor treatment.    Coagulation Studies: No results found for this basename: LABPROT, INR,  in the last 72 hours  Urinalysis: No results found for this basename: COLORURINE, APPERANCEUR, LABSPEC, PHURINE, GLUCOSEU, HGBUR, BILIRUBINUR, KETONESUR, PROTEINUR, UROBILINOGEN, NITRITE, LEUKOCYTESUR,  in the last 72 hours    Imaging: Ct Head Wo Contrast  01/08/2014   CLINICAL DATA:  Left hand numbness.  EXAM: CT HEAD WITHOUT CONTRAST  TECHNIQUE: Contiguous axial images were obtained from the base of the skull through the vertex without intravenous contrast.  COMPARISON:  None.  FINDINGS: There is no evidence for acute hemorrhage, hydrocephalus, mass lesion, or abnormal extra-axial fluid collection. No definite CT evidence for acute infarction.  Mild chronic mucosal disease is seen in the maxillary sinuses bilaterally. Small lipoma is visible in the right frontal scalp.  IMPRESSION: No acute intracranial abnormality.   Electronically Signed   By: Misty Stanley M.D.   On: 01/08/2014 18:23     Medications:   . sodium chloride 20 mL/hr at 01/07/14 0936   . antiseptic oral rinse  15 mL Mouth Rinse BID  . aspirin  325 mg Oral Daily  . calcitRIOL  0.5 mcg Oral Daily  . calcium carbonate  200 mg of elemental calcium Oral TID WC  . carvedilol  6.25 mg Oral BID WC  . heparin  5,000 Units Subcutaneous 3 times per day  . insulin aspart protamine- aspart  12 Units Subcutaneous Q breakfast  . levofloxacin  500 mg Oral Q48H   sodium chloride, sodium chloride, diphenhydrAMINE, feeding supplement (NEPRO CARB STEADY), heparin, lidocaine (PF), lidocaine-prilocaine, oxyCODONE, pentafluoroprop-tetrafluoroeth, traZODone  Assessment/ Plan:  1. ESRD secondary to Diabetic nephrosclerosis outpatient [facility] 2. Hyperkalemia- resolved with dialysis 3. Metabolic acidosis- resolved 4. MGUS- recommend Heme - Onc follow up 5. HTN- variable. Improved with volume control 6. Anemia- ESA and Iron 7. RUL  densities on CT scan- worrisome for bronchogenic carcinoma recommend pulmonary appointment 8. MBD- PTH greater than 1000 Phos greater than 6 Start binders and Vit D 9. Dispo- will need SW consult  to help with financial issues and dialysis clearance  Fistula  placed   Discharge today     LOS: 6 [name] W @TODAY @11 :17 AM

## 2014-01-10 NOTE — Discharge Summary (Signed)
Physician Discharge Summary  [name] [id number] DOB: June 08, 1956 DOA: 01/04/2014  PCP: Gara Kroner, MD  Admit date: 01/04/2014 Discharge date: 01/10/2014  Time spent: 35 minutes  Recommendations for Outpatient Follow-up:  Patient will be discharged home. He should follow up with primary care physician one week of discharge.  He will also need followup with hematology/oncology. She will also need a repeat CT chest within 6 months to evaluate his pulmonary nodule.  He continue dialysis as scheduled. Patient will need to present to the dialysis center on Friday, 01/11/2014 to complete paperwork. He should continue his medications as prescribed. Patient should follow a renal the diet.  Discharge Diagnoses:  Community-acquired pneumonia, treated Interstitial disease, requiring hemodialysis MGUS, requiring for outpatient followup Pulmonary nodule, requiring outpatient followup Left hand numbness, resolved Hypertension, stable  Discharge Condition: Stable  Diet recommendation: Renal diet  Filed Weights   01/09/14 1148 01/09/14 1505 01/09/14 2226  Weight: 143.5 kg (316 lb 5.8 oz) 140.5 kg (309 lb 11.9 oz) 140.207 kg (309 lb 1.6 oz)    History of present illness:  [name] is a 58 y.o. male who presents to the ED with 3 day history of worsening cough, productive of whitish sputum; SOB, worsening peripheral edema. Patient has h/o severe CKD but isnt very compliant with follow up. After extensive review of history and meds with family, ultimately it is determined that the patient ran out of his lasix in December, and despite prodding from family members, did not follow up with nephrology to get this refilled.   Hospital Course:  Community-acquired pneumonia  -Chest x-ray showed upper lobe pneumonia  -Patient was placed on Levaquin and vancomycin -Patient completed his course of Levaquin in the hospital. He will not need outpatient antibiotics.  End-stage renal disease    -Secondary to diabetic nephrosclerosis  -Nephrology consulted and following  -Patient received a fistula today, by vascular surgery  -Continue rocaltrol, calcium carbonate   MGUS  -Patient was diagnosed in 2013, was noncompliant with followup  -Will need outpatient hematology oncology followup   Pulmonary nodule  -Seen on CT of the chest, right apical 6 mm nodule  -Will need outpatient follow up with pulmonary as well as repeat CT of the chest in 6 months   Left hand numbness  -Secondary to unknown cause at this time, possibly positional  -Numbness is intermittent and last a few minutes  -CT head shows no acute intracranial abnormality  -B12 level 468   Hypertension  -Not well-controlled, will continue Coreg  Procedures: Placement of Right radiocephalic AV fistula [id number]  Consultations: Nephrology Vascular surgery  Discharge Exam: Filed Vitals:   01/10/14 0930  BP: 135/75  Pulse: 71  Temp: 97.8 F (36.6 C)  Resp: 22   Exam  General: Well developed, well nourished, NAD, appears stated age  HEENT: NCAT, mucous membranes moist.  Neck: Supple, no JVD, no masses  Cardiovascular: S1 S2 auscultated, Regular rate and rhythm.  Respiratory: Clear to auscultation bilaterally with equal chest rise  Abdomen: Soft, obese, nontender, nondistended, + bowel sounds  Extremities: warm dry without cyanosis clubbing or edema  Neuro: AAOx3, cranial nerves grossly intact.  Skin: Without rashes exudates or nodules  Psych: Normal affect and demeanor with intact judgement and insight  Discharge Instructions      Discharge Orders   Future Orders Complete By Expires   Diet - low sodium heart healthy  As directed    Discharge instructions  As directed    Comments:  Patient will be discharged home. He should follow up with primary care physician one week of discharge.  He will also need followup with hematology/oncology. She will also need a repeat CT chest within 6 months to  evaluate his pulmonary nodule.  He continue dialysis as scheduled. Patient will need to present to the dialysis center on Friday, 01/11/2014 to complete paperwork. He should continue his medications as prescribed. Patient should follow a renal the diet.   Increase activity slowly  As directed        Medication List    STOP taking these medications       potassium chloride SA 20 MEQ tablet  Commonly known as:  K-DUR,KLOR-CON     Pseudoeph-Doxylamine-DM-APAP 30-6.25-15-325 MG Caps      TAKE these medications       allopurinol 100 MG tablet  Commonly known as:  ZYLOPRIM  Take 100 mg by mouth daily.     aspirin 325 MG tablet  Take 325 mg by mouth daily.     calcitRIOL 0.5 MCG capsule  Commonly known as:  ROCALTROL  Take 1 capsule (0.5 mcg total) by mouth daily.     carvedilol 6.25 MG tablet  Commonly known as:  COREG  Take 1 tablet (6.25 mg total) by mouth 2 (two) times daily with a meal.     feeding supplement (NEPRO CARB STEADY) Liqd  Take 237 mLs by mouth as needed (missed meal during dialysis.).     insulin aspart protamine- aspart (70-30) 100 UNIT/ML injection  Commonly known as:  NOVOLOG MIX 70/30  Inject 12 Units into the skin daily with breakfast.     OVER THE COUNTER MEDICATION  Apply 1 application topically daily as needed (for muscle pains). "flex all" spray     traZODone 50 MG tablet  Commonly known as:  DESYREL  Take 1 tablet (50 mg total) by mouth at bedtime as needed for sleep.       Allergies  Allergen Reactions  . Penicillins Nausea And Vomiting and Other (See Comments)    Stomach pains also  . Vioxx [Rofecoxib] Nausea Only and Other (See Comments)    Gi upset.   Follow-up Information   Follow up with Gara Kroner, MD. Schedule an appointment as soon as possible for a visit in 1 week.   Specialty:  Family Medicine   Contact information:   [address] [place] [address] [phone]       Schedule an appointment as soon  as possible for a visit with [facility]. ([facility])       Schedule an appointment as soon as possible for a visit with [facility]. (Pulmonary nodule)    Specialty:  Pulmonology   Contact information:   [address] [place] [address] [phone]      Call DICKSON,CHRISTOPHER S, MD. (As needed)    Specialty:  Vascular Surgery   Contact information:   [address] [place] [address] [phone]        The results of significant diagnostics from this hospitalization (including imaging, microbiology, ancillary and laboratory) are listed below for reference.    Significant Diagnostic Studies: Dg Chest 2 View  01/04/2014   CLINICAL DATA:  Right-sided chest pain with chest congestion and shortness of breath.  EXAM: CHEST  2 VIEW  COMPARISON:  01/04/2014 and 01/10/2012 and 04/02/2008  FINDINGS: There is cardiomegaly. Pulmonary vascularity appears normal. There is a faint right perihilar infiltrate with fullness of the right  hilum. There are no effusions. Left lung is clear. No acute osseous abnormality.  IMPRESSION: Right perihilar infiltrate with fullness in the right hilum and in the azygos region. This could represent pneumonia with adenopathy but the possibility of adenopathy and a mass with postobstructive infiltrate should be considered. Follow-up is recommended.   Electronically Signed   By: Rozetta Nunnery M.D.   On: 01/04/2014 19:10   Dg Chest 2 View  01/04/2014   CLINICAL DATA:  Bronchitis. Shortness of breath. History of congestive heart failure.  EXAM: CHEST  2 VIEW  COMPARISON:  01/10/2012.  04/02/2008.  FINDINGS: Cardiac silhouette is enlarged. The aorta is unfolded. There is extensive overlying soft tissue density. I do not think there is any dense consolidation, collapse or a fusion. Difficulty completely rule out a patchy infiltrate in the right lung, though I favor this is overlying soft tissue. The right hilum is prominent, more so than seen on some  of the older studies. Because of the question of right hilar enlargement and right pulmonary parenchymal density, I would suggest that a chest CT to ensure that we are not missing neoplastic disease. No effusions. No significant bony finding.  IMPRESSION: Cardiac enlargement.  Unfolded aorta up.  Question right pulmonary parenchymal density in the lower lung with enlarged right hilum. Whereas this could be due to inflammatory disease, consider chest CT to rule out neoplastic disease.   Electronically Signed   By: Nelson Chimes M.D.   On: 01/04/2014 11:47   Ct Head Wo Contrast  01/08/2014   CLINICAL DATA:  Left hand numbness.  EXAM: CT HEAD WITHOUT CONTRAST  TECHNIQUE: Contiguous axial images were obtained from the base of the skull through the vertex without intravenous contrast.  COMPARISON:  None.  FINDINGS: There is no evidence for acute hemorrhage, hydrocephalus, mass lesion, or abnormal extra-axial fluid collection. No definite CT evidence for acute infarction.  Mild chronic mucosal disease is seen in the maxillary sinuses bilaterally. Small lipoma is visible in the right frontal scalp.  IMPRESSION: No acute intracranial abnormality.   Electronically Signed   By: Misty Stanley M.D.   On: 01/08/2014 18:23   Ct Chest Wo Contrast  01/04/2014   CLINICAL DATA:  Short of breath.  Right flank lower ribcage pain.  EXAM: CT CHEST WITHOUT CONTRAST  TECHNIQUE: Multidetector CT imaging of the chest was performed following the standard protocol without IV contrast.  COMPARISON:  Chest radiograph 01/04/2014  FINDINGS: No pleural effusion identified. Peripheral and basilar predominant interstitial reticulation is noted. Right upper lobe peribronchovascular densities are identified, image 24/series 3. A few scattered peribronchovascular densities are noted in the central right lower lobe. Right apical nodule is identified measuring 6 mm, image 10/series 3.  The heart size is mild to moderately enlarged. There is no  pericardial effusion. Prominent mediastinal lymph nodes are identified. Left paratracheal lymph node measures 1.6 cm, image 19/series 2. Right paratracheal lymph node is equal to 1.2 cm, image 20/series 2. The sub- carinal lymph node measures 1.6 cm, image 28/series 2.  There is no axillary or supraclavicular adenopathy. Incidental imaging through the upper abdomen is unremarkable.  Review of the visualized bony structures demonstrates no aggressive lytic or sclerotic bone lesions.  IMPRESSION: 1. Right upper lobe peribronchovascular densities are favored to represent early pneumonia. Findings may account for the patient's right sided chest pain. 2. Right apical nodule measures 6 mm. If the patient is at high risk for bronchogenic carcinoma, follow-up chest CT at 6-12 months is  recommended. If the patient is at low risk for bronchogenic carcinoma, follow-up chest CT at 12 months is recommended. This recommendation follows the consensus statement: Guidelines for Management of Small Pulmonary Nodules Detected on CT Scans: A Statement from the [place] as published in Radiology 2005;237:395-400. 3. Enlarged mediastinal lymph nodes are nonspecific. In the setting of infection it is conceivable that these could be reactive in etiology. Differential considerations include metastatic adenopathy or lymphoproliferative disorder.   Electronically Signed   By: Kerby Moors M.D.   On: 01/04/2014 20:27   US Renal  01/05/2014   CLINICAL DATA:  Acute on chronic kidney failure. Monoclonal gammopathy. Diabetes.  EXAM: RENAL/URINARY TRACT ULTRASOUND COMPLETE  COMPARISON:  01/11/2012  FINDINGS: Right Kidney:  Length: 12.2 cm. Diffusely increased parenchymal echogenicity which is increased since previous exam. Several small renal cysts noted, but no masses identified. No evidence of hydronephrosis.  Left Kidney:  Length: 10.0 cm. Diffusely increased parenchymal echogenicity which is increased since previous study. No  evidence of renal mass or hydronephrosis.  Bladder:  Appears normal for degree of bladder distention.  IMPRESSION: Diffusely increased renal parenchymal echogenicity, consistent with medical renal disease. No evidence of hydronephrosis.   Electronically Signed   By: Earle Gell M.D.   On: 01/05/2014 20:34   Dg Chest Port 1 View  01/07/2014   CLINICAL DATA:  59 year old male status post dialysis catheter placement. Initial encounter.  EXAM: PORTABLE CHEST - 1 VIEW  COMPARISON:  Chest CT 12/2013 and earlier.  FINDINGS: Portable AP semi upright view at at 1054 hrs. Right IJ approach dual lumen dialysis catheter. Catheter tips project at the level of the lower SVC and at or just above the cavoatrial junction. No pneumothorax. Mildly lower lung volumes. Stable cardiac size and mediastinal contours. Visualized tracheal air column is within normal limits. Pulmonary vascular congestion without overt edema. No effusion or consolidation identified.  IMPRESSION: 1. Right IJ approach dialysis catheter placed with no pneumothorax. 2. Lower lung volumes.  Vascular congestion without overt edema.   Electronically Signed   By: Lars Pinks M.D.   On: 01/07/2014 11:12   Dg Fluoro Guide Cv Line-no Report  01/07/2014   CLINICAL DATA: dialysis catheter   FLOURO GUIDE CV LINE  Fluoroscopy was utilized by the requesting physician.  No radiographic  interpretation.     Microbiology: Recent Results (from the past 240 hour(s))  CULTURE, BLOOD (ROUTINE X 2)     Status: None   Collection Time    01/04/14  7:29 PM      Result Value Ref Range Status   Specimen Description BLOOD ARM LEFT   Final   Special Requests BOTTLES DRAWN AEROBIC AND ANAEROBIC 5CC   Final   Culture  Setup Time     Final   Value: 01/05/2014 00:56     Performed at [facility]   Culture     Final   Value:        BLOOD CULTURE RECEIVED NO GROWTH TO DATE CULTURE WILL BE HELD FOR 5 DAYS BEFORE ISSUING A FINAL NEGATIVE REPORT     Performed at [facility]   Report Status PENDING   Incomplete  CULTURE, BLOOD (ROUTINE X 2)     Status: None   Collection Time    01/04/14  7:36 PM      Result Value Ref Range Status   Specimen Description BLOOD ARM RIGHT   Final   Special Requests BOTTLES DRAWN AEROBIC AND ANAEROBIC 5CC  Final   Culture  Setup Time     Final   Value: 01/05/2014 00:55     Performed at [facility]   Culture     Final   Value:        BLOOD CULTURE RECEIVED NO GROWTH TO DATE CULTURE WILL BE HELD FOR 5 DAYS BEFORE ISSUING A FINAL NEGATIVE REPORT     Performed at [facility]   Report Status PENDING   Incomplete  SURGICAL PCR SCREEN     Status: Abnormal   Collection Time    01/07/14  5:25 PM      Result Value Ref Range Status   MRSA, PCR NEGATIVE  NEGATIVE Final   Staphylococcus aureus POSITIVE (*) NEGATIVE Final   Comment:            The Xpert SA Assay (FDA     approved for NASAL specimens     in patients over 63 years of age),     is one component of     a comprehensive surveillance     program.  Test performance has     been validated by [name] [facility] for patients greater     than or equal to 43 year old.     It is not intended     to diagnose infection nor to     guide or monitor treatment.     Labs: Basic Metabolic Panel:  Recent Labs Lab 01/05/14 0358 01/06/14 0845 01/07/14 0647 01/08/14 0519 01/09/14 1223  NA 137 138 139 140 142  K 5.8* 5.5* 5.0 4.4 4.9  CL 103 100 102 99 104  CO2 18* 21 19 23 23   GLUCOSE 144* 160* 108* 113* 123*  BUN 73* 77* 78* 64* 69*  CREATININE 9.97* 10.80* 10.81* 9.22* 10.20*  CALCIUM 8.5 8.6 8.2* 8.3* 8.6  PHOS  --   --  7.3* 6.3* 5.6*   Liver Function Tests:  Recent Labs Lab 01/07/14 0647 01/08/14 0519 01/09/14 1223  ALBUMIN 3.0* 3.0* 3.0*   No results found for this basename: LIPASE, AMYLASE,  in the last 168 hours No results found for this basename: AMMONIA,  in the last 168 hours CBC:  Recent Labs Lab 01/04/14 1649  01/05/14 0358 01/09/14 1223  WBC 5.9 7.6 7.1  HGB 8.9* 8.9* 9.0*  HCT 29.4* 29.1* 29.0*  MCV 75.8* 75.8* 74.0*  PLT 225 228 236   Cardiac Enzymes: No results found for this basename: CKTOTAL, CKMB, CKMBINDEX, TROPONINI,  in the last 168 hours BNP: BNP (last 3 results)  Recent Labs  01/04/14 1649  PROBNP 2553.0*   CBG:  Recent Labs Lab 01/09/14 2222 01/10/14 0645 01/10/14 0907 01/10/14 0946 01/10/14 1118  GLUCAP 133* 153* 159* 160* 204*       Signed:  Daviel Allegretto  [facility] 01/10/2014, 12:45 PM

## 2014-01-10 NOTE — Telephone Encounter (Addendum)
Message copied by Gena Fray on Thu Jan 10, 2014  1:11 PM ------      Message from: Peter Minium K      Created: Thu Jan 10, 2014  9:39 AM      Regarding: Schedule                   ----- Message -----         From: Angelia Mould, MD         Sent: 01/10/2014   9:04 AM           To: [facility] Charge Pool      Subject: charge                                                   PROCEDURE: Right radiocephalic AV fistula            SURGEON: Judeth Cornfield. Scot Dock, MD, FACS            ASSIST: Leontine Locket, PA      He'll need a follow up visit in 6 weeks with a duplex of his right AV fistula to check on the maturation of this fistula. Thank you. CD ------  01/10/14: lm for pt re appt, dpm

## 2014-01-10 NOTE — Interval H&P Note (Signed)
History and Physical Interval Note:  01/10/2014 7:18 AM  [name]  has presented today for surgery, with the diagnosis of End Stage Renal Disease  The various methods of treatment have been discussed with the patient and family. After consideration of risks, benefits and other options for treatment, the patient has consented to  Procedure(s): ARTERIOVENOUS (AV) FISTULA CREATION- RIGHT RADIOCEPHALIC  (Right) as a surgical intervention .  The patient's history has been reviewed, patient examined, no change in status, stable for surgery.  I have reviewed the patient's chart and labs.  Questions were answered to the patient's satisfaction.     DICKSON,CHRISTOPHER S

## 2014-01-10 NOTE — Discharge Instructions (Signed)
Pneumonia, Adult °Pneumonia is an infection of the lungs.  °CAUSES °Pneumonia may be caused by bacteria or a virus. Usually, these infections are caused by breathing infectious particles into the lungs (respiratory tract). °SYMPTOMS  °· Cough. °· Fever. °· Chest pain. °· Increased rate of breathing. °· Wheezing. °· Mucus production. °DIAGNOSIS  °If you have the common symptoms of pneumonia, your caregiver will typically confirm the diagnosis with a chest X-ray. The X-ray will show an abnormality in the lung (pulmonary infiltrate) if you have pneumonia. Other tests of your blood, urine, or sputum may be done to find the specific cause of your pneumonia. Your caregiver may also do tests (blood gases or pulse oximetry) to see how well your lungs are working. °TREATMENT  °Some forms of pneumonia may be spread to other people when you cough or sneeze. You may be asked to wear a mask before and during your exam. Pneumonia that is caused by bacteria is treated with antibiotic medicine. Pneumonia that is caused by the influenza virus may be treated with an antiviral medicine. Most other viral infections must run their course. These infections will not respond to antibiotics.  °PREVENTION °A pneumococcal shot (vaccine) is available to prevent a common bacterial cause of pneumonia. This is usually suggested for: °· People over 65 years old. °· Patients on chemotherapy. °· People with chronic lung problems, such as bronchitis or emphysema. °· People with immune system problems. °If you are over 65 or have a high risk condition, you may receive the pneumococcal vaccine if you have not received it before. In some countries, a routine influenza vaccine is also recommended. This vaccine can help prevent some cases of pneumonia. You may be offered the influenza vaccine as part of your care. °If you smoke, it is time to quit. You may receive instructions on how to stop smoking. Your caregiver can provide medicines and counseling to  help you quit. °HOME CARE INSTRUCTIONS  °· Cough suppressants may be used if you are losing too much rest. However, coughing protects you by clearing your lungs. You should avoid using cough suppressants if you can. °· Your caregiver may have prescribed medicine if he or she thinks your pneumonia is caused by a bacteria or influenza. Finish your medicine even if you start to feel better. °· Your caregiver may also prescribe an expectorant. This loosens the mucus to be coughed up. °· Only take over-the-counter or prescription medicines for pain, discomfort, or fever as directed by your caregiver. °· Do not smoke. Smoking is a common cause of bronchitis and can contribute to pneumonia. If you are a smoker and continue to smoke, your cough may last several weeks after your pneumonia has cleared. °· A cold steam vaporizer or humidifier in your room or home may help loosen mucus. °· Coughing is often worse at night. Sleeping in a semi-upright position in a recliner or using a couple pillows under your head will help with this. °· Get rest as you feel it is needed. Your body will usually let you know when you need to rest. °SEEK IMMEDIATE MEDICAL CARE IF:  °· Your illness becomes worse. This is especially true if you are elderly or weakened from any other disease. °· You cannot control your cough with suppressants and are losing sleep. °· You begin coughing up blood. °· You develop pain which is getting worse or is uncontrolled with medicines. °· You have a fever. °· Any of the symptoms which initially brought you in for treatment   are getting worse rather than better.  You develop shortness of breath or chest pain. MAKE SURE YOU:   Understand these instructions.  Will watch your condition.  Will get help right away if you are not doing well or get worse. Document Released: 11/01/2005 Document Revised: 01/24/2012 Document Reviewed: 01/21/2011 [facility] Patient Information 2014 [place], [place]. End-Stage Kidney  Disease The kidneys are two organs that lie on either side of the spine between the middle of the back and the front of the abdomen. The kidneys:   Remove wastes and extra water from the blood.   Produce important hormones. These help keep bones strong, regulate blood pressure, and help create red blood cells.   Balance the fluids and chemicals in the blood and tissues. End-stage kidney disease occurs when the kidneys are so damaged that they cannot do their job. When the kidneys cannot do their job, life-threatening problems occur. The body cannot stay clean and strong without the help of the kidneys. In end-stage kidney disease, the kidneys cannot get better.You need a new kidney or treatments to do some of the work healthy kidneys do in order to stay alive. CAUSES  End-stage kidney disease usually occurs when a long-lasting (chronic) kidney disease gets worse. It may also occur after the kidneys are suddenly damaged (acute kidney injury).  SYMPTOMS   Swelling (edema) of the legs, ankles, or feet.   Tiredness (lethargy).   Nausea or vomiting.   Confusion.   Problems with urination, such as:   Decreased urine production.   Frequent urination, especially at night.   Frequent accidents in children who are potty trained.   Muscle twitches and cramps.   Persistent itchiness.   Loss of appetite.   Headaches.   Abnormally dark or light skin.   Numbness in the hands or feet.   Easy bruising.   Frequent hiccups.   Menstruation stops. DIAGNOSIS  Your caregiver will measure your blood pressure and take some tests. These may include:   Urine tests.   Blood tests.   Imaging tests, such as:   An ultrasound exam.   Computed tomography (CT).  A kidney biopsy. TREATMENT  There are two treatments for end-stage kidney disease:   A procedure that removes toxic wastes from the body (dialysis).   Receiving a new kidney (kidney transplant). Both of  these treatments have serious risks and consequences. Your caregiver will help you determine which treatment is best for you based on your health, age, and other factors. In addition to having dialysis or a kidney transplant, you may need to take medicines to control high blood pressure (hypertension) and cholesterol and to decrease phosphorus levels in your blood.  HOME CARE INSTRUCTIONS  Follow your prescribed diet.   Only take over-the-counter or prescription medicines as directed by your caregiver.   Do not take any new medicines (prescription, over-the-counter, or nutritional supplements) unless approved by your caregiver. Many medicines can worsen your kidney damage or need to have the dose adjusted.   Keep all follow-up appointments. MAKE SURE YOU:  Understand these instructions.  Will watch your condition.  Will get help right away if you are not doing well or get worse Document Released: 01/22/2004 Document Revised: 10/18/2012 Document Reviewed: 06/30/2012 [facility] Patient Information 2014 [place].

## 2014-01-10 NOTE — Anesthesia Postprocedure Evaluation (Signed)
  Anesthesia Post-op Note  Patient: [name]  Procedure(s) Performed: Procedure(s): ARTERIOVENOUS (AV) FISTULA CREATION- RIGHT RADIOCEPHALIC  (Right)  Patient Location: PACU  Anesthesia Type:MAC  Level of Consciousness: awake and alert   Airway and Oxygen Therapy: Patient Spontanous Breathing  Post-op Pain: none  Post-op Assessment: Post-op Vital signs reviewed  Post-op Vital Signs: stable  Complications: No apparent anesthesia complications

## 2014-01-11 ENCOUNTER — Encounter (HOSPITAL_COMMUNITY): Payer: Self-pay | Admitting: Vascular Surgery

## 2014-01-11 LAB — CULTURE, BLOOD (ROUTINE X 2)
Culture: NO GROWTH
Culture: NO GROWTH

## 2014-01-12 DIAGNOSIS — D689 Coagulation defect, unspecified: Secondary | ICD-10-CM | POA: Insufficient documentation

## 2014-01-12 DIAGNOSIS — N189 Chronic kidney disease, unspecified: Secondary | ICD-10-CM | POA: Insufficient documentation

## 2014-01-12 DIAGNOSIS — D631 Anemia in chronic kidney disease: Secondary | ICD-10-CM | POA: Insufficient documentation

## 2014-01-15 ENCOUNTER — Telehealth: Payer: Self-pay

## 2014-01-15 DIAGNOSIS — G8918 Other acute postprocedural pain: Secondary | ICD-10-CM

## 2014-01-15 MED ORDER — OXYCODONE-ACETAMINOPHEN 5-325 MG PO TABS: ORAL_TABLET | ORAL | Status: DC

## 2014-01-15 NOTE — Telephone Encounter (Signed)
Phone call from pt's wife regarding need for pain medication.  Stated pt. was not given a prescription for pain medication, when he left hospital, following his surgery.  Stated pt. Has tried OTC pain medication, but it has not been effective.  Stated pt. Bumped his arm this morning, and c/o the right arm throbbing, now.  Stated the incision is okay; no sign of separation of the incision.   Will d/w MD in the office, and return call to the pt's wife.

## 2014-01-15 NOTE — Telephone Encounter (Signed)
Discussed w/ Dr. Donnetta Hutching.  Gave approval for ordering Percocet, # 30, no refills.  Notified pt's. wife that Rx will be available for pick-up, at office front desk.  Agrees w/ plan.

## 2014-01-21 DIAGNOSIS — IMO0002 Reserved for concepts with insufficient information to code with codable children: Secondary | ICD-10-CM | POA: Insufficient documentation

## 2014-01-21 DIAGNOSIS — J189 Pneumonia, unspecified organism: Secondary | ICD-10-CM | POA: Insufficient documentation

## 2014-01-21 DIAGNOSIS — E1165 Type 2 diabetes mellitus with hyperglycemia: Secondary | ICD-10-CM | POA: Insufficient documentation

## 2014-01-25 ENCOUNTER — Telehealth: Payer: Self-pay | Admitting: Hematology and Oncology

## 2014-01-25 NOTE — Telephone Encounter (Signed)
CALLED PATIENT TO GAVE APPT PER PATIENT CALL BACK MONDAY.

## 2014-01-28 ENCOUNTER — Telehealth: Payer: Self-pay | Admitting: Hematology and Oncology

## 2014-01-28 NOTE — Telephone Encounter (Signed)
S/W PATIENT AND GAVE NEW PATIENT APPT FOR 3/26 @ 10:15 W/DR. [place].  REFERRING DR. Moshe Cipro

## 2014-01-29 ENCOUNTER — Telehealth: Payer: Self-pay | Admitting: Hematology and Oncology

## 2014-01-29 ENCOUNTER — Ambulatory Visit: Payer: Self-pay | Admitting: Hematology and Oncology

## 2014-01-29 NOTE — Telephone Encounter (Signed)
C/D 01/29/14 for appt. 02/07/14 °

## 2014-02-07 ENCOUNTER — Ambulatory Visit: Payer: Self-pay | Admitting: Hematology and Oncology

## 2014-02-08 ENCOUNTER — Ambulatory Visit: Payer: Self-pay | Admitting: Cardiology

## 2014-02-12 ENCOUNTER — Encounter: Payer: Self-pay | Admitting: Vascular Surgery

## 2014-02-12 ENCOUNTER — Institutional Professional Consult (permissible substitution): Payer: Self-pay | Admitting: Emergency Medicine

## 2014-02-13 ENCOUNTER — Encounter (INDEPENDENT_AMBULATORY_CARE_PROVIDER_SITE_OTHER): Payer: Self-pay

## 2014-02-13 ENCOUNTER — Ambulatory Visit (HOSPITAL_COMMUNITY)
Admission: RE | Admit: 2014-02-13 | Discharge: 2014-02-13 | Disposition: A | Discharge status: Home / Self Care | Payer: 59 | Source: Ambulatory Visit | Attending: Vascular Surgery | Admitting: Vascular Surgery

## 2014-02-13 ENCOUNTER — Ambulatory Visit (INDEPENDENT_AMBULATORY_CARE_PROVIDER_SITE_OTHER): Payer: 59 | Admitting: Vascular Surgery

## 2014-02-13 ENCOUNTER — Encounter: Payer: Self-pay | Admitting: Vascular Surgery

## 2014-02-13 VITALS — BP 151/87 | HR 99 | Resp 18 | Ht 72.0 in | Wt 298.0 lb

## 2014-02-13 DIAGNOSIS — Z4931 Encounter for adequacy testing for hemodialysis: Secondary | ICD-10-CM

## 2014-02-13 DIAGNOSIS — N186 End stage renal disease: Secondary | ICD-10-CM

## 2014-02-13 NOTE — Progress Notes (Signed)
   Patient name: [name] MRN: [id number] DOB: 12-08-1955 Sex: male  REASON FOR VISIT: Follow up of AV fistula  HPI: [name] is a 58 y.o. male who had a right radiocephalic AV fistula placed on 01/10/2014. The vein was approximately 4 mm. He comes in for a 6 week follow up visit. He dialyzes with a tunneled dialysis catheter.he has no specific complaints on the right upper extremity except for some occasional paresthesias on the ulnar aspect of his hand.  REVIEW OF SYSTEMS: Valu.Nieves ] denotes positive finding; [  ] denotes negative finding  CARDIOVASCULAR:  [ ]  chest pain   [ ]  dyspnea on exertion    CONSTITUTIONAL:  [ ]  fever   [ ]  chills  PHYSICAL EXAM: Filed Vitals:   02/13/14 1254  BP: 151/87  Pulse: 99  Resp: 18  Height: 6' (1.829 m)  Weight: 298 lb (135.172 kg)   Body mass index is 40.41 kg/(m^2). GENERAL: The patient is a well-nourished male, in no acute distress. The vital signs are documented above. CARDIOVASCULAR: There is a regular rate and rhythm. PULMONARY: There is good air exchange bilaterally without wheezing or rales. His fistula has a good thrill which can be palpated up the majority of the forearm.  I have independently interpreted his duplex scan which shows that the diameters range from 0.47-0.57 cm. Depths look reasonable at 0.36-0.56. Aero some increased velocities in the proximal fistula beyond the anastomosis.  MEDICAL ISSUES: End stage renal disease His right radiocephalic AV fistula appears to be maturing adequately. There is some increased velocities in the proximal fistula, however, the fistula has a good thrill beyond this level so I'm not sure that this is significant. I've ordered a follow duplex scan in 6 weeks and I'll see him back at that time. Hopefully with continued enlargement of the fistula it will be ready for access soon after that.   [facility] [place]: [phone]

## 2014-02-13 NOTE — Addendum Note (Signed)
Addended by: Dorthula Rue L on: 02/13/2014 05:39 PM   Modules accepted: Orders

## 2014-02-13 NOTE — Assessment & Plan Note (Signed)
His right radiocephalic AV fistula appears to be maturing adequately. There is some increased velocities in the proximal fistula, however, the fistula has a good thrill beyond this level so I'm not sure that this is significant. I've ordered a follow duplex scan in 6 weeks and I'll see him back at that time. Hopefully with continued enlargement of the fistula it will be ready for access soon after that.

## 2014-02-18 ENCOUNTER — Ambulatory Visit: Payer: Self-pay | Admitting: Hematology and Oncology

## 2014-02-26 ENCOUNTER — Ambulatory Visit: Payer: Self-pay | Admitting: Cardiology

## 2014-03-06 ENCOUNTER — Institutional Professional Consult (permissible substitution): Payer: Self-pay | Admitting: Emergency Medicine

## 2014-03-12 ENCOUNTER — Ambulatory Visit: Payer: 59 | Admitting: Cardiology

## 2014-03-26 ENCOUNTER — Encounter: Payer: Self-pay | Admitting: Vascular Surgery

## 2014-03-27 ENCOUNTER — Ambulatory Visit (INDEPENDENT_AMBULATORY_CARE_PROVIDER_SITE_OTHER): Payer: 59 | Admitting: Vascular Surgery

## 2014-03-27 ENCOUNTER — Other Ambulatory Visit: Payer: Self-pay | Admitting: Vascular Surgery

## 2014-03-27 ENCOUNTER — Encounter: Payer: Self-pay | Admitting: Vascular Surgery

## 2014-03-27 ENCOUNTER — Ambulatory Visit (HOSPITAL_COMMUNITY)
Admission: RE | Admit: 2014-03-27 | Discharge: 2014-03-27 | Disposition: A | Discharge status: Home / Self Care | Payer: 59 | Source: Ambulatory Visit | Attending: Vascular Surgery | Admitting: Vascular Surgery

## 2014-03-27 VITALS — BP 106/67 | HR 76 | Resp 18 | Ht 72.0 in | Wt 301.0 lb

## 2014-03-27 DIAGNOSIS — Z4931 Encounter for adequacy testing for hemodialysis: Secondary | ICD-10-CM | POA: Diagnosis not present

## 2014-03-27 DIAGNOSIS — N186 End stage renal disease: Secondary | ICD-10-CM

## 2014-03-27 NOTE — Progress Notes (Signed)
[facility]  Subjective  - S/P Right radio cephalic fistula creation 6 weeks ago.  He reports no symptoms of steal.  He is currently on dialysis using a right IJ catheter.  Objective 106/67 76   18    Duplex  depth .28-0.4 Diameter 0.47-0.68  Incision has healed well Palpable radial pulses equal bilaterally. N/V/M intact right upper extremity. Palpable thrill best at the anastomosis.  Assessment/Planning:  6 weeks s/p AV fistula creation right forearm The fistula appears to be maturing well We will have him follow up for another duplex in 6 weeks before they use it for dialysis The patient was seen in conjunction with Dr. Rosilyn Mings Lennie Muckle 03/27/2014 2:43 PM --  Agree with above. I'll see him back in 6 weeks with a duplex to be sure that the fistula continues to mature. If his branches continued to enlarge were there any other concerns he could potentially require a fistulogram.  Deitra Mayo, MD, [place] [phone] 03/27/2014

## 2014-04-02 ENCOUNTER — Ambulatory Visit (INDEPENDENT_AMBULATORY_CARE_PROVIDER_SITE_OTHER): Payer: 59 | Admitting: Emergency Medicine

## 2014-04-02 ENCOUNTER — Encounter: Payer: Self-pay | Admitting: Emergency Medicine

## 2014-04-02 VITALS — BP 124/72 | HR 85 | Ht 72.0 in | Wt 306.0 lb

## 2014-04-02 DIAGNOSIS — G4730 Sleep apnea, unspecified: Secondary | ICD-10-CM

## 2014-04-02 DIAGNOSIS — R911 Solitary pulmonary nodule: Secondary | ICD-10-CM

## 2014-04-02 DIAGNOSIS — J309 Allergic rhinitis, unspecified: Secondary | ICD-10-CM

## 2014-04-02 MED ORDER — LORATADINE 10 MG PO TABS: 10.0000 mg | ORAL_TABLET | Freq: Every day | ORAL | Status: DC

## 2014-04-02 NOTE — Assessment & Plan Note (Signed)
-   trial loratadine +/- chlorpheniramine

## 2014-04-02 NOTE — Patient Instructions (Signed)
Start using loratadine 10mg  daily (Claritin) Use OTC decongestants that contain chlorpheniramine as directed We will repeat your CT scan of the chest in August 2015 We will arrange for a sleep study We may decide to perform breathing testing in the future Please follow with Dr Lamonte Sakai in 3 months after your CT scan

## 2014-04-02 NOTE — Assessment & Plan Note (Signed)
-   will order a sleep study for suspected OSA

## 2014-04-02 NOTE — Assessment & Plan Note (Signed)
Risk profile is increased by family hx and tobacco hx - repeat CT scan in 8/'15

## 2014-04-02 NOTE — Progress Notes (Signed)
HPI:  58 yo former smoker (11 pk-yrs) with CKD, HTN, DM, MGUS. He was admitted to [facility] 2/20-26 and dx with RUL HCAP. He was treated with abx and improved. He is now newly on dialysis. CT scan chest done 2/20 showed a 11mm RUL apical nodule. He is referred for hosp f/u and to follow the R apical nodule. PPD negative  He denies any cough or dyspnea. No wheezing. Remains very active.     Past Medical History  Diagnosis Date  . Diabetes mellitus   . Hypertension   . Hyperlipidemia   . Gout   . Alcohol abuse   . Tobacco abuse   . Renal insufficiency   . Congestive heart disease 12/2011  . Hyperlipidemia   . Monoclonal gammopathies 02/25/2012  . Pedal edema      Family History  Problem Relation Age of Onset  . Lung cancer Father   . Diabetes Mother   . Heart disease Mother      History   Social History  . Marital Status: Married    Spouse Name: [name]    Number of Children: 2  . Years of Education: 12   Occupational History  . Security    Social History Main Topics  . Smoking status: Former Smoker -- 0.30 packs/day for 33 years    Types: Cigarettes    Quit date: 07/16/2010  . Smokeless tobacco: Never Used  . Alcohol Use: 21.0 oz/week    42 drink(s) per week  . Drug Use: No  . Sexual Activity: No   Other Topics Concern  . Not on file   Social History Narrative   Married, lives in [place] with wife.  He has worked in [employer] Has lived in [place], [place], [place] He was in [employer], went to [place] and [place]   Allergies  Allergen Reactions  . Penicillins Nausea And Vomiting and Other (See Comments)    Stomach pains also  . Vioxx [Rofecoxib] Nausea Only and Other (See Comments)    Gi upset.     Outpatient Prescriptions Prior to Visit  Medication Sig Dispense Refill  . allopurinol (ZYLOPRIM) 100 MG tablet Take 100 mg by mouth daily.      . carvedilol (COREG) 6.25 MG tablet Take 1 tablet (6.25 mg total) by mouth 2 (two) times daily with a meal.  60 tablet  0  . insulin aspart  protamine-insulin aspart (NOVOLOG 70/30) (70-30) 100 UNIT/ML injection Inject 12 Units into the skin daily with breakfast.       . Nutritional Supplements (FEEDING SUPPLEMENT, NEPRO CARB STEADY,) LIQD Take 237 mLs by mouth as needed (missed meal during dialysis.).    0  . OVER THE COUNTER MEDICATION Apply 1 application topically daily as needed (for muscle pains). "flex all" spray      . oxyCODONE-acetaminophen (PERCOCET/ROXICET) 5-325 MG per tablet May take 1 tab q 4-6 hrs/ prn / pain  30 tablet  0  . traZODone (DESYREL) 50 MG tablet Take 1 tablet (50 mg total) by mouth at bedtime as needed for sleep.  30 tablet  0  . aspirin 325 MG tablet Take 325 mg by mouth daily.      . calcitRIOL (ROCALTROL) 0.5 MCG capsule Take 1 capsule (0.5 mcg total) by mouth daily.  30 capsule  0   No facility-administered medications prior to visit.     Filed Vitals:   04/02/14 1129  BP: 124/72  Pulse: 85  Height: 6' (1.829 m)  Weight: 306 lb (  138.801 kg)  SpO2: 98%   Gen: Pleasant, overwt, in no distress,  normal affect  ENT: No lesions,  mouth clear,  oropharynx clear, no postnasal drip  Neck: No JVD, no TMG, no carotid bruits  Lungs: No use of accessory muscles, clear without rales or rhonchi  Cardiovascular: RRR, heart sounds normal, no murmur or gallops, no peripheral edema  Musculoskeletal: No deformities, no cyanosis or clubbing  Neuro: alert, non focal  Skin: Warm, no lesions or rashes     03/04/14 --  COMPARISON: Chest radiograph 01/04/2014  FINDINGS:  No pleural effusion identified. Peripheral and basilar predominant  interstitial reticulation is noted. Right upper lobe  peribronchovascular densities are identified, image 24/series 3. A  few scattered peribronchovascular densities are noted in the central  right lower lobe. Right apical nodule is identified measuring 6 mm,  image 10/series 3.  The heart size is mild to moderately enlarged. There is no  pericardial effusion.  Prominent mediastinal lymph nodes are  identified. Left paratracheal lymph node measures 1.6 cm, image  19/series 2. Right paratracheal lymph node is equal to 1.2 cm, image  20/series 2. The sub- carinal lymph node measures 1.6 cm, image  28/series 2.  There is no axillary or supraclavicular adenopathy. Incidental  imaging through the upper abdomen is unremarkable.  Review of the visualized bony structures demonstrates no aggressive  lytic or sclerotic bone lesions.  IMPRESSION:  1. Right upper lobe peribronchovascular densities are favored to  represent early pneumonia. Findings may account for the patient's  right sided chest pain.  2. Right apical nodule measures 6 mm. If the patient is at high risk  for bronchogenic carcinoma, follow-up chest CT at 6-12 months is  recommended. If the patient is at low risk for bronchogenic  carcinoma, follow-up chest CT at 12 months is recommended. This  recommendation follows the consensus statement: Guidelines for  Management of Small Pulmonary Nodules Detected on CT Scans: A  Statement from the [employer] as published in Radiology  2005;237:395-400.  3. Enlarged mediastinal lymph nodes are nonspecific. In the setting  of infection it is conceivable that these could be reactive in  etiology. Differential considerations include metastatic adenopathy  or lymphoproliferative disorder   A/P:   Unspecified sleep apnea - will order a sleep study for suspected OSA  Pulmonary nodule Risk profile is increased by family hx and tobacco hx - repeat CT scan in 8/'15  Allergic rhinitis - trial loratadine +/- chlorpheniramine

## 2014-04-03 ENCOUNTER — Encounter: Payer: Self-pay | Admitting: Cardiology

## 2014-04-03 ENCOUNTER — Ambulatory Visit: Payer: 59 | Admitting: Cardiology

## 2014-04-11 ENCOUNTER — Ambulatory Visit: Payer: 59 | Admitting: Cardiovascular Disease

## 2014-05-03 ENCOUNTER — Encounter: Payer: Self-pay | Admitting: Cardiovascular Disease

## 2014-05-03 ENCOUNTER — Ambulatory Visit (INDEPENDENT_AMBULATORY_CARE_PROVIDER_SITE_OTHER): Payer: 59 | Admitting: Cardiovascular Disease

## 2014-05-03 VITALS — BP 118/68 | HR 84 | Ht 71.0 in | Wt 300.0 lb

## 2014-05-03 DIAGNOSIS — E785 Hyperlipidemia, unspecified: Secondary | ICD-10-CM

## 2014-05-03 DIAGNOSIS — I10 Essential (primary) hypertension: Secondary | ICD-10-CM

## 2014-05-03 NOTE — Assessment & Plan Note (Signed)
On statin therapy followed by his PCP 

## 2014-05-03 NOTE — Patient Instructions (Signed)
Your physician wants you to follow-up in: 1 year with Dr Berry. You will receive a reminder letter in the mail two months in advance. If you don't receive a letter, please call our office to schedule the follow-up appointment.  

## 2014-05-03 NOTE — Assessment & Plan Note (Signed)
Under good control and her medications 

## 2014-05-03 NOTE — Progress Notes (Signed)
05/03/2014 [name]   1956-02-20  [id number]  Primary Physician Gara Kroner, MD Primary Cardiologist: Lorretta Harp MD Renae Gloss   HPI:  [name] is a delightful 58 year old moderately overweight married African American male father of 2 children, grandfather of her grandchildren He is being seen for cardiovascular evaluation because of risk factors. He works Careers adviser. His cardiac risk factors are remarkable for treated hypertension, hyperlipidemia and diabetes. His sister apparently myocardial infarction. He has never had a heart or stroke and denies chest pain or shortness of breath. He did start hemodialysis and back in February as a fistula in his right upper extremity. He is also being evaluated for obstructive sleep apnea. He carries a diagnosis of MGUS.   Current Outpatient Prescriptions  Medication Sig Dispense Refill  . allopurinol (ZYLOPRIM) 100 MG tablet Take 100 mg by mouth daily.      Marland Kitchen amLODipine (NORVASC) 5 MG tablet Take 5 mg by mouth daily.      Marland Kitchen aspirin 81 MG tablet Take 81 mg by mouth daily.      . carvedilol (COREG) 6.25 MG tablet Take 1 tablet (6.25 mg total) by mouth 2 (two) times daily with a meal.  60 tablet  0  . indomethacin (INDOCIN) 50 MG capsule Take 50 mg by mouth 2 (two) times daily with a meal.      . insulin aspart protamine-insulin aspart (NOVOLOG 70/30) (70-30) 100 UNIT/ML injection Inject 12 Units into the skin daily with breakfast.       . lanthanum (FOSRENOL) 1000 MG chewable tablet Chew 1,000 mg by mouth 2 (two) times daily with a meal.      . loratadine (CLARITIN) 10 MG tablet Take 1 tablet (10 mg total) by mouth daily.  30 tablet  4  . Nutritional Supplements (FEEDING SUPPLEMENT, NEPRO CARB STEADY,) LIQD Take 237 mLs by mouth as needed (missed meal during dialysis.).    0  . OVER THE COUNTER MEDICATION Apply 1 application topically daily as needed (for muscle pains). "flex all" spray      .  oxyCODONE-acetaminophen (PERCOCET/ROXICET) 5-325 MG per tablet May take 1 tab q 4-6 hrs/ prn / pain  30 tablet  0  . pravastatin (PRAVACHOL) 40 MG tablet Take 40 mg by mouth daily.      . traZODone (DESYREL) 50 MG tablet Take 1 tablet (50 mg total) by mouth at bedtime as needed for sleep.  30 tablet  0   No current facility-administered medications for this visit.    Allergies  Allergen Reactions  . Penicillins Nausea And Vomiting and Other (See Comments)    Stomach pains also  . Vioxx [Rofecoxib] Nausea Only and Other (See Comments)    Gi upset.    History   Social History  . Marital Status: Married    Spouse Name: [name]    Number of Children: 2  . Years of Education: 12   Occupational History  . Security    Social History Main Topics  . Smoking status: Former Smoker -- 0.30 packs/day for 33 years    Types: Cigarettes    Quit date: 07/16/2010  . Smokeless tobacco: Never Used  . Alcohol Use: 21.0 oz/week    42 drink(s) per week  . Drug Use: No  . Sexual Activity: No   Other Topics Concern  . Not on file   Social History Narrative   Married, lives in [place] with wife.     Review of Systems:  General: negative for chills, fever, night sweats or weight changes.  Cardiovascular: negative for chest pain, dyspnea on exertion, edema, orthopnea, palpitations, paroxysmal nocturnal dyspnea or shortness of breath Dermatological: negative for rash Respiratory: negative for cough or wheezing Urologic: negative for hematuria Abdominal: negative for nausea, vomiting, diarrhea, bright red blood per rectum, melena, or hematemesis Neurologic: negative for visual changes, syncope, or dizziness All other systems reviewed and are otherwise negative except as noted above.    Blood pressure 118/68, pulse 84, height 5\' 11"  (1.803 m), weight 300 lb (136.079 kg).  General appearance: alert and no distress Neck: no adenopathy, no carotid bruit, no JVD, supple, symmetrical, trachea midline  and thyroid not enlarged, symmetric, no tenderness/mass/nodules Lungs: clear to auscultation bilaterally Heart: regular rate and rhythm, S1, S2 normal, no murmur, click, rub or gallop Extremities: extremities normal, atraumatic, no cyanosis or edema and 2+ pedal pulses bilaterally  EKG normal sinus rhythm 84 with left axis deviation question old inferior myocardial infarction  ASSESSMENT AND PLAN:   Hypertension Under good control and her medications  Hyperlipidemia On statin therapy followed by his PCP      Lorretta Harp MD [facility], [facility] 05/03/2014 3:37 PM

## 2014-05-07 ENCOUNTER — Encounter: Payer: Self-pay | Admitting: Vascular Surgery

## 2014-05-08 ENCOUNTER — Ambulatory Visit (HOSPITAL_COMMUNITY)
Admission: RE | Admit: 2014-05-08 | Discharge: 2014-05-08 | Disposition: A | Discharge status: Home / Self Care | Payer: 59 | Source: Ambulatory Visit | Attending: Vascular Surgery | Admitting: Vascular Surgery

## 2014-05-08 ENCOUNTER — Encounter: Payer: Self-pay | Admitting: Vascular Surgery

## 2014-05-08 ENCOUNTER — Ambulatory Visit (INDEPENDENT_AMBULATORY_CARE_PROVIDER_SITE_OTHER): Payer: Self-pay | Admitting: Vascular Surgery

## 2014-05-08 VITALS — BP 109/70 | HR 87 | Resp 18 | Ht 72.0 in | Wt 302.0 lb

## 2014-05-08 DIAGNOSIS — N186 End stage renal disease: Secondary | ICD-10-CM

## 2014-05-08 DIAGNOSIS — Z4931 Encounter for adequacy testing for hemodialysis: Secondary | ICD-10-CM

## 2014-05-08 NOTE — Progress Notes (Signed)
    Established Dialysis Access  History of Present Illness  [name] is a 58 y.o. ([birth year]) male who presents for follow-up of his right radiocephalic AV fistula performed on 01/10/14 by Dr. Scot Dock. He currently receives dialysis through his right IJ catheter on Mondays, Wednesdays, and Fridays. He denies any steal symptoms. He does note intermittent numbness in his hands bilaterally.   The patient's PMH, PSH, SH, FamHx, Med, and Allergies are unchanged from 03/27/14.  On ROS today: he denies hand pain, cramping or non healing wounds.   Physical Examination  Filed Vitals:   05/08/14 1503  BP: 109/70  Pulse: 87  Resp: 18  Height: 6' (1.829 m)  Weight: 302 lb (136.986 kg)   Body mass index is 40.95 kg/(m^2).  General: A&O x 3, WD obese male in NAD  Eyes: Pupils equal  Pulmonary: Non labored breathing.   Vascular: Vessel Right Left  Radial Palpable Palpable  Ulnar Palpable Palpable   Musculoskeletal: M/S 5/5 upper extremities. Extremities without  ischemic changes. Palpable thrill in right radiocephalic fistula.   Neurologic: Pain and light touch intact in extremities  Non-Invasive Vascular Imaging  right arm Access Duplex  (Date: 05/08/2014):   Diameters:  .32 cm  Depth:  .42 cm  PSV:  238 c/s  Medical Decision Making  [name] is a 58 y.o. male who presents with ESRD requiring hemodialysis.   Based on today's duplex studies, his right radiocephalic fistula is not ready for use.  He denies any steal symptoms.   Recommended fistulogram for evaluation of access. Discussed procedure, risks and benefits of procedure.  The patient has agreed to proceed with the above procedure which will be scheduled on an upcoming Tuesday or Thursday. He receives dialysis on Mondays, Wednesdays and Fridays.   Virgina Jock, PA-C [facility] [place] Office: [phone]  Agree with above. His duplex today shows elevated velocities in  the proximal fistula in addition to a large branch just below the antecubital level. Diameters at this point are not adequate. There is no significant plaque or thrombus noted. Given that the fistula to this point has not matured adequately I have recommended that we proceed with a fistulogram. He dialyzes on Monday Wednesdays and Fridays so we will schedule this on a Tuesday or Thursday.  Deitra Mayo, MD, [place] [phone] 05/08/2014   Pager: [phone]  05/08/2014, 3:34 PM

## 2014-05-09 ENCOUNTER — Other Ambulatory Visit: Payer: Self-pay

## 2014-05-10 ENCOUNTER — Encounter (HOSPITAL_COMMUNITY): Payer: Self-pay

## 2014-05-13 MED ORDER — SODIUM CHLORIDE 0.9 % IJ SOLN: 3.0000 mL | INTRAMUSCULAR | Status: DC | PRN

## 2014-05-14 ENCOUNTER — Encounter (HOSPITAL_COMMUNITY)
Admission: RE | Disposition: A | Discharge status: Home / Self Care | Payer: Self-pay | Source: Ambulatory Visit | Attending: Vascular Surgery

## 2014-05-14 ENCOUNTER — Ambulatory Visit (HOSPITAL_COMMUNITY)
Admission: RE | Admit: 2014-05-14 | Discharge: 2014-05-14 | Disposition: A | Discharge status: Home / Self Care | Payer: 59 | Source: Ambulatory Visit | Attending: Vascular Surgery | Admitting: Vascular Surgery

## 2014-05-14 DIAGNOSIS — E669 Obesity, unspecified: Secondary | ICD-10-CM | POA: Insufficient documentation

## 2014-05-14 DIAGNOSIS — N186 End stage renal disease: Secondary | ICD-10-CM | POA: Insufficient documentation

## 2014-05-14 DIAGNOSIS — Y832 Surgical operation with anastomosis, bypass or graft as the cause of abnormal reaction of the patient, or of later complication, without mention of misadventure at the time of the procedure: Secondary | ICD-10-CM | POA: Insufficient documentation

## 2014-05-14 DIAGNOSIS — T82898A Other specified complication of vascular prosthetic devices, implants and grafts, initial encounter: Secondary | ICD-10-CM

## 2014-05-14 DIAGNOSIS — T82598A Other mechanical complication of other cardiac and vascular devices and implants, initial encounter: Secondary | ICD-10-CM | POA: Insufficient documentation

## 2014-05-14 DIAGNOSIS — Z992 Dependence on renal dialysis: Secondary | ICD-10-CM | POA: Insufficient documentation

## 2014-05-14 DIAGNOSIS — Z6841 Body Mass Index (BMI) 40.0 and over, adult: Secondary | ICD-10-CM | POA: Insufficient documentation

## 2014-05-14 HISTORY — PX: SHUNTOGRAM: SHX5491

## 2014-05-14 LAB — POCT I-STAT, CHEM 8
BUN: 61 mg/dL — AB (ref 6–23)
CHLORIDE: 98 meq/L (ref 96–112)
CREATININE: 11.8 mg/dL — AB (ref 0.50–1.35)
Calcium, Ion: 1.33 mmol/L — ABNORMAL HIGH (ref 1.12–1.23)
Glucose, Bld: 146 mg/dL — ABNORMAL HIGH (ref 70–99)
HCT: 45 % (ref 39.0–52.0)
HEMOGLOBIN: 15.3 g/dL (ref 13.0–17.0)
POTASSIUM: 4.6 meq/L (ref 3.7–5.3)
SODIUM: 138 meq/L (ref 137–147)
TCO2: 25 mmol/L (ref 0–100)

## 2014-05-14 LAB — GLUCOSE, CAPILLARY
Glucose-Capillary: 150 mg/dL — ABNORMAL HIGH (ref 70–99)
Glucose-Capillary: 127 mg/dL — ABNORMAL HIGH (ref 70–99)

## 2014-05-14 SURGERY — ASSESSMENT, SHUNT FUNCTION, WITH CONTRAST RADIOGRAPHIC STUDY
Anesthesia: LOCAL | Laterality: Right

## 2014-05-14 MED ORDER — ACETAMINOPHEN 325 MG PO TABS: 325.0000 mg | ORAL_TABLET | ORAL | Status: DC | PRN

## 2014-05-14 MED ORDER — HEPARIN (PORCINE) IN NACL 2-0.9 UNIT/ML-% IJ SOLN
INTRAMUSCULAR | Status: AC
  Filled 2014-05-14: qty 500

## 2014-05-14 MED ORDER — LABETALOL HCL 5 MG/ML IV SOLN: 10.0000 mg | INTRAVENOUS | Status: DC | PRN

## 2014-05-14 MED ORDER — ACETAMINOPHEN 325 MG RE SUPP: 325.0000 mg | RECTAL | Status: DC | PRN

## 2014-05-14 MED ORDER — HYDRALAZINE HCL 20 MG/ML IJ SOLN: 10.0000 mg | INTRAMUSCULAR | Status: DC | PRN

## 2014-05-14 MED ORDER — LIDOCAINE HCL (PF) 1 % IJ SOLN
INTRAMUSCULAR | Status: AC
  Filled 2014-05-14: qty 30

## 2014-05-14 MED ORDER — METOPROLOL TARTRATE 1 MG/ML IV SOLN: 2.0000 mg | INTRAVENOUS | Status: DC | PRN

## 2014-05-14 NOTE — Op Note (Signed)
    Patient name: [name] MRN: [id number] DOB: [birth year] Sex: male  05/14/2014 Pre-operative Diagnosis: Non-maturing right radiocephalic fistula Post-operative diagnosis:  Same Surgeon:  Eldridge Abrahams Procedure Performed:  1.  ultrasound-guided access, right cephalic vein  2.  fistulogram     Indications:  The patient is status post right radiocephalic fistula which has not matured he comes in for further evaluation  Procedure:  The patient was identified in the holding area and taken to room 8.  The patient was then placed supine on the table and prepped and draped in the usual sterile fashion.  A time out was called.  Ultrasound was used to evaluate the fistula.  The vein was patent and compressible.  A digital ultrasound image was acquired.  The fistula was then accessed under ultrasound guidance using a micropuncture needle.  An 018 wire was then asvanced without resistance and a micropuncture sheath was placed.  Contrast injections were then performed through the sheath.  Findings:  The arterial venous anastomosis is widely patent.  There is a competing branches in the mid forearm as well as several branches to the deep system at the antecubital crease.  The cephalic vein is widely patent in the upper arm.  There is no significant central venous stenosis however there is sluggish flow around the dialysis catheter.  There was a slight area of narrowing near the cannulation site in the cephalic vein of the forearm which was felt to be secondary to spasm.   Intervention:  None  Impression:  #1  widely patent right radiocephalic fistula  #2  if the fistula does not go on to full maturation, consider branch ligation    V. Annamarie Major, M.D. [facility] [place] Office: [phone] Pager:  [phone]

## 2014-05-14 NOTE — H&P (View-Only) (Signed)
    Established Dialysis Access  History of Present Illness  [name] is a 58 y.o. (February 06, 1956) male who presents for follow-up of his right radiocephalic AV fistula performed on 01/10/14 by Dr. Scot Dock. He currently receives dialysis through his right IJ catheter on Mondays, Wednesdays, and Fridays. He denies any steal symptoms. He does note intermittent numbness in his hands bilaterally.   The patient's PMH, PSH, SH, FamHx, Med, and Allergies are unchanged from 03/27/14.  On ROS today: he denies hand pain, cramping or non healing wounds.   Physical Examination  Filed Vitals:   05/08/14 1503  BP: 109/70  Pulse: 87  Resp: 18  Height: 6' (1.829 m)  Weight: 302 lb (136.986 kg)   Body mass index is 40.95 kg/(m^2).  General: A&O x 3, WD obese male in NAD  Eyes: Pupils equal  Pulmonary: Non labored breathing.   Vascular: Vessel Right Left  Radial Palpable Palpable  Ulnar Palpable Palpable   Musculoskeletal: M/S 5/5 upper extremities. Extremities without  ischemic changes. Palpable thrill in right radiocephalic fistula.   Neurologic: Pain and light touch intact in extremities  Non-Invasive Vascular Imaging  right arm Access Duplex  (Date: 05/08/2014):   Diameters:  .32 cm  Depth:  .42 cm  PSV:  238 c/s  Medical Decision Making  [name] is a 58 y.o. male who presents with ESRD requiring hemodialysis.   Based on today's duplex studies, his right radiocephalic fistula is not ready for use.  He denies any steal symptoms.   Recommended fistulogram for evaluation of access. Discussed procedure, risks and benefits of procedure.  The patient has agreed to proceed with the above procedure which will be scheduled on an upcoming Tuesday or Thursday. He receives dialysis on Mondays, Wednesdays and Fridays.   Virgina Jock, PA-C [facility] [place] Office: [phone]  Agree with above. His duplex today shows elevated velocities in  the proximal fistula in addition to a large branch just below the antecubital level. Diameters at this point are not adequate. There is no significant plaque or thrombus noted. Given that the fistula to this point has not matured adequately I have recommended that we proceed with a fistulogram. He dialyzes on Monday Wednesdays and Fridays so we will schedule this on a Tuesday or Thursday.  Deitra Mayo, MD, [place] [phone] 05/08/2014   Pager: [phone]  05/08/2014, 3:34 PM

## 2014-05-14 NOTE — Interval H&P Note (Signed)
History and Physical Interval Note:  05/14/2014 7:38 AM  [name]  has presented today for surgery, with the diagnosis of instage renal  The various methods of treatment have been discussed with the patient and family. After consideration of risks, benefits and other options for treatment, the patient has consented to  Procedure(s): FISTULOGRAM (Right) as a surgical intervention .  The patient's history has been reviewed, patient examined, no change in status, stable for surgery.  I have reviewed the patient's chart and labs.  Questions were answered to the patient's satisfaction.     BRABHAM IV, V. WELLS

## 2014-05-14 NOTE — Discharge Instructions (Signed)
CALL [phone] DR BRABHAM'S OFFICE IF ANY PROBLEMS,QUESTIONS, OR CONCERNS; CALL IF ANY BLEEDING,DRAINAGE,FEVER,PAIN,SWELLING, OR REDNESS AT FISTULOGRAM SITE

## 2014-05-15 ENCOUNTER — Telehealth: Payer: Self-pay | Admitting: Emergency Medicine

## 2014-05-15 ENCOUNTER — Other Ambulatory Visit: Payer: Self-pay | Admitting: *Deleted

## 2014-05-15 NOTE — Telephone Encounter (Signed)
lmomtcb x1 

## 2014-05-16 NOTE — Telephone Encounter (Signed)
i spoke with [name] and she stated that she spoke with libby earlier about this pt and libby was going to call the insurance company to find out about the study since the pt is scheduled for this on Monday and the office is closed on Friday. Will forward to libby to advise if this was taken care of.  thanks

## 2014-05-16 NOTE — Telephone Encounter (Signed)
Called, spoke with pt -  He has a split night sleep study scheduled for Monday, July 6.  Per pt, he received a letter from [facility] stating they did not want to pay for the study because they didn't see a need for this. Per pt, it sounded like the insurance co wanted MD office to call them.   [name], please advise ASAP as this is scheduled for Monday and we are closed tomorrow d/t holiday if you have received information regarding this. Thank you.

## 2014-05-17 ENCOUNTER — Encounter (HOSPITAL_COMMUNITY): Payer: Self-pay | Admitting: Pharmacy Technician

## 2014-05-20 ENCOUNTER — Encounter (HOSPITAL_BASED_OUTPATIENT_CLINIC_OR_DEPARTMENT_OTHER): Payer: 59

## 2014-05-20 NOTE — Telephone Encounter (Signed)
Message sent to dr byrum to send order to do home study attended study denied by [facility] pt is aware and will agree to do [place]

## 2014-05-20 NOTE — Telephone Encounter (Signed)
PCCs, pls advise on this.  Sleep study is scheduled for tonight.  Thank you.

## 2014-05-27 ENCOUNTER — Encounter (HOSPITAL_COMMUNITY): Payer: Self-pay | Admitting: *Deleted

## 2014-05-27 MED ORDER — VANCOMYCIN HCL 10 G IV SOLR
1500.0000 mg | INTRAVENOUS | Status: AC
  Administered 2014-05-28: 1500 mg via INTRAVENOUS
  Filled 2014-05-27: qty 1500

## 2014-05-27 NOTE — Progress Notes (Signed)
Pt denies SOB and chest pain but is currently under the care of Dr. Malvin Johns ( cardiology) at [facility].

## 2014-05-27 NOTE — Progress Notes (Signed)
05/27/14 1806  OBSTRUCTIVE SLEEP APNEA  Have you ever been diagnosed with sleep apnea through a sleep study? No  Do you snore loudly (loud enough to be heard through closed doors)?  0  Do you often feel tired, fatigued, or sleepy during the daytime? 1  Has anyone observed you stop breathing during your sleep? 0  Do you have, or are you being treated for high blood pressure? 0  BMI more than 35 kg/m2? 1  Age over 57 years old? 1  Gender: 1  Obstructive Sleep Apnea Score 4

## 2014-05-28 ENCOUNTER — Ambulatory Visit (HOSPITAL_COMMUNITY): Payer: 59 | Admitting: Anesthesiology

## 2014-05-28 ENCOUNTER — Ambulatory Visit (HOSPITAL_COMMUNITY)
Admission: RE | Admit: 2014-05-28 | Discharge: 2014-05-28 | Disposition: A | Discharge status: Home / Self Care | Payer: 59 | Source: Ambulatory Visit | Attending: Vascular Surgery | Admitting: Vascular Surgery

## 2014-05-28 ENCOUNTER — Encounter (HOSPITAL_COMMUNITY): Payer: 59 | Admitting: Anesthesiology

## 2014-05-28 ENCOUNTER — Other Ambulatory Visit: Payer: Self-pay | Admitting: *Deleted

## 2014-05-28 ENCOUNTER — Other Ambulatory Visit: Payer: Self-pay

## 2014-05-28 ENCOUNTER — Encounter (HOSPITAL_COMMUNITY): Payer: Self-pay | Admitting: Anesthesiology

## 2014-05-28 ENCOUNTER — Telehealth: Payer: Self-pay | Admitting: Vascular Surgery

## 2014-05-28 ENCOUNTER — Encounter (HOSPITAL_COMMUNITY)
Admission: RE | Disposition: A | Discharge status: Home / Self Care | Payer: Self-pay | Source: Ambulatory Visit | Attending: Vascular Surgery

## 2014-05-28 DIAGNOSIS — Z48812 Encounter for surgical aftercare following surgery on the circulatory system: Secondary | ICD-10-CM

## 2014-05-28 DIAGNOSIS — T82898A Other specified complication of vascular prosthetic devices, implants and grafts, initial encounter: Secondary | ICD-10-CM

## 2014-05-28 DIAGNOSIS — N186 End stage renal disease: Secondary | ICD-10-CM

## 2014-05-28 DIAGNOSIS — N185 Chronic kidney disease, stage 5: Secondary | ICD-10-CM

## 2014-05-28 DIAGNOSIS — G4730 Sleep apnea, unspecified: Secondary | ICD-10-CM | POA: Insufficient documentation

## 2014-05-28 DIAGNOSIS — I509 Heart failure, unspecified: Secondary | ICD-10-CM | POA: Insufficient documentation

## 2014-05-28 DIAGNOSIS — I120 Hypertensive chronic kidney disease with stage 5 chronic kidney disease or end stage renal disease: Secondary | ICD-10-CM | POA: Insufficient documentation

## 2014-05-28 DIAGNOSIS — Z4931 Encounter for adequacy testing for hemodialysis: Secondary | ICD-10-CM

## 2014-05-28 DIAGNOSIS — E119 Type 2 diabetes mellitus without complications: Secondary | ICD-10-CM | POA: Insufficient documentation

## 2014-05-28 DIAGNOSIS — Y832 Surgical operation with anastomosis, bypass or graft as the cause of abnormal reaction of the patient, or of later complication, without mention of misadventure at the time of the procedure: Secondary | ICD-10-CM | POA: Insufficient documentation

## 2014-05-28 DIAGNOSIS — Z87891 Personal history of nicotine dependence: Secondary | ICD-10-CM | POA: Insufficient documentation

## 2014-05-28 DIAGNOSIS — Z6841 Body Mass Index (BMI) 40.0 and over, adult: Secondary | ICD-10-CM | POA: Insufficient documentation

## 2014-05-28 HISTORY — PX: LIGATION OF COMPETING BRANCHES OF ARTERIOVENOUS FISTULA: SHX5949

## 2014-05-28 LAB — GLUCOSE, CAPILLARY
GLUCOSE-CAPILLARY: 115 mg/dL — AB (ref 70–99)
Glucose-Capillary: 159 mg/dL — ABNORMAL HIGH (ref 70–99)

## 2014-05-28 LAB — POCT I-STAT 4, (NA,K, GLUC, HGB,HCT)
Glucose, Bld: 144 mg/dL — ABNORMAL HIGH (ref 70–99)
HCT: 40 % (ref 39.0–52.0)
HEMOGLOBIN: 13.6 g/dL (ref 13.0–17.0)
Potassium: 5 mEq/L (ref 3.7–5.3)
SODIUM: 140 meq/L (ref 137–147)

## 2014-05-28 SURGERY — LIGATION OF COMPETING BRANCHES OF ARTERIOVENOUS FISTULA
Anesthesia: Monitor Anesthesia Care | Site: Arm Upper | Laterality: Right

## 2014-05-28 MED ORDER — THROMBIN 20000 UNITS EX SOLR
CUTANEOUS | Status: AC
Start: 2014-05-28 — End: 2014-05-28
  Filled 2014-05-28: qty 20000

## 2014-05-28 MED ORDER — MIDAZOLAM HCL 5 MG/5ML IJ SOLN
INTRAMUSCULAR | Status: DC | PRN
  Administered 2014-05-28: 2 mg via INTRAVENOUS

## 2014-05-28 MED ORDER — 0.9 % SODIUM CHLORIDE (POUR BTL) OPTIME
TOPICAL | Status: DC | PRN
  Administered 2014-05-28: 1000 mL

## 2014-05-28 MED ORDER — LIDOCAINE-EPINEPHRINE (PF) 1 %-1:200000 IJ SOLN
INTRAMUSCULAR | Status: DC | PRN
  Administered 2014-05-28: 20 mL via INTRADERMAL

## 2014-05-28 MED ORDER — CHLORHEXIDINE GLUCONATE CLOTH 2 % EX PADS: 6.0000 | MEDICATED_PAD | Freq: Once | CUTANEOUS | Status: DC

## 2014-05-28 MED ORDER — SODIUM CHLORIDE 0.9 % IV SOLN
INTRAVENOUS | Status: DC | PRN
  Administered 2014-05-28: 07:00:00 via INTRAVENOUS

## 2014-05-28 MED ORDER — SODIUM CHLORIDE 0.9 % IV SOLN: INTRAVENOUS | Status: DC

## 2014-05-28 MED ORDER — FENTANYL CITRATE 0.05 MG/ML IJ SOLN
INTRAMUSCULAR | Status: AC
  Filled 2014-05-28: qty 5

## 2014-05-28 MED ORDER — PROPOFOL 10 MG/ML IV BOLUS
INTRAVENOUS | Status: AC
  Filled 2014-05-28: qty 20

## 2014-05-28 MED ORDER — HYDROMORPHONE HCL PF 1 MG/ML IJ SOLN: 0.2500 mg | INTRAMUSCULAR | Status: DC | PRN

## 2014-05-28 MED ORDER — FENTANYL CITRATE 0.05 MG/ML IJ SOLN
INTRAMUSCULAR | Status: DC | PRN
  Administered 2014-05-28: 50 ug via INTRAVENOUS
  Administered 2014-05-28: 25 ug via INTRAVENOUS
  Administered 2014-05-28: 50 ug via INTRAVENOUS
  Administered 2014-05-28: 25 ug via INTRAVENOUS

## 2014-05-28 MED ORDER — OXYCODONE HCL 5 MG PO TABS: 5.0000 mg | ORAL_TABLET | Freq: Four times a day (QID) | ORAL | Status: DC | PRN

## 2014-05-28 MED ORDER — ONDANSETRON HCL 4 MG/2ML IJ SOLN: 4.0000 mg | Freq: Once | INTRAMUSCULAR | Status: DC | PRN

## 2014-05-28 MED ORDER — LIDOCAINE HCL (CARDIAC) 20 MG/ML IV SOLN
INTRAVENOUS | Status: DC | PRN
  Administered 2014-05-28: 50 mg via INTRAVENOUS

## 2014-05-28 MED ORDER — OXYCODONE HCL 5 MG/5ML PO SOLN: 5.0000 mg | Freq: Once | ORAL | Status: AC | PRN

## 2014-05-28 MED ORDER — OXYCODONE HCL 5 MG PO TABS
5.0000 mg | ORAL_TABLET | Freq: Once | ORAL | Status: AC | PRN
  Administered 2014-05-28: 5 mg via ORAL

## 2014-05-28 MED ORDER — MIDAZOLAM HCL 2 MG/2ML IJ SOLN
INTRAMUSCULAR | Status: AC
  Filled 2014-05-28: qty 2

## 2014-05-28 MED ORDER — LIDOCAINE HCL (CARDIAC) 20 MG/ML IV SOLN
INTRAVENOUS | Status: AC
  Filled 2014-05-28: qty 5

## 2014-05-28 MED ORDER — PROPOFOL 10 MG/ML IV BOLUS
INTRAVENOUS | Status: DC | PRN
  Administered 2014-05-28 (×8): 20 mg via INTRAVENOUS

## 2014-05-28 MED ORDER — MEPERIDINE HCL 25 MG/ML IJ SOLN: 6.2500 mg | INTRAMUSCULAR | Status: DC | PRN

## 2014-05-28 MED ORDER — OXYCODONE HCL 5 MG PO TABS
ORAL_TABLET | ORAL | Status: AC
  Filled 2014-05-28: qty 1

## 2014-05-28 SURGICAL SUPPLY — 34 items
BLADE 10 SAFETY STRL DISP (BLADE) ×2 IMPLANT
CANISTER SUCTION 2500CC (MISCELLANEOUS) ×2 IMPLANT
CLIP TI MEDIUM 6 (CLIP) ×2 IMPLANT
CLIP TI WIDE RED SMALL 6 (CLIP) ×2 IMPLANT
COVER SURGICAL LIGHT HANDLE (MISCELLANEOUS) ×2 IMPLANT
DERMABOND ADVANCED (GAUZE/BANDAGES/DRESSINGS) ×1
DERMABOND ADVANCED .7 DNX12 (GAUZE/BANDAGES/DRESSINGS) ×1 IMPLANT
ELECT REM PT RETURN 9FT ADLT (ELECTROSURGICAL) ×2
ELECTRODE REM PT RTRN 9FT ADLT (ELECTROSURGICAL) ×1 IMPLANT
GEL ULTRASOUND 20GR AQUASONIC (MISCELLANEOUS) IMPLANT
GLOVE BIO SURGEON STRL SZ7.5 (GLOVE) ×2 IMPLANT
GLOVE BIOGEL PI IND STRL 8 (GLOVE) ×1 IMPLANT
GLOVE BIOGEL PI INDICATOR 8 (GLOVE) ×1
GOWN STRL REUS W/ TWL LRG LVL3 (GOWN DISPOSABLE) ×3 IMPLANT
GOWN STRL REUS W/TWL LRG LVL3 (GOWN DISPOSABLE) ×3
KIT BASIN OR (CUSTOM PROCEDURE TRAY) ×2 IMPLANT
KIT ROOM TURNOVER OR (KITS) ×2 IMPLANT
NS IRRIG 1000ML POUR BTL (IV SOLUTION) ×2 IMPLANT
PACK CV ACCESS (CUSTOM PROCEDURE TRAY) ×2 IMPLANT
PAD ARMBOARD 7.5X6 YLW CONV (MISCELLANEOUS) ×4 IMPLANT
SPONGE GAUZE 4X4 12PLY (GAUZE/BANDAGES/DRESSINGS) ×2 IMPLANT
SPONGE SURGIFOAM ABS GEL 100 (HEMOSTASIS) IMPLANT
SUT ETHILON 3 0 PS 1 (SUTURE) IMPLANT
SUT PROLENE 6 0 BV (SUTURE) ×2 IMPLANT
SUT SILK 0 TIES 10X30 (SUTURE) ×2 IMPLANT
SUT VIC AB 3-0 SH 27 (SUTURE) ×2
SUT VIC AB 3-0 SH 27X BRD (SUTURE) ×2 IMPLANT
SUT VICRYL 4-0 PS2 18IN ABS (SUTURE) ×4 IMPLANT
SWAB COLLECTION DEVICE MRSA (MISCELLANEOUS) IMPLANT
TOWEL OR 17X24 6PK STRL BLUE (TOWEL DISPOSABLE) ×2 IMPLANT
TOWEL OR 17X26 10 PK STRL BLUE (TOWEL DISPOSABLE) ×2 IMPLANT
TUBE ANAEROBIC SPECIMEN COL (MISCELLANEOUS) IMPLANT
UNDERPAD 30X30 INCONTINENT (UNDERPADS AND DIAPERS) ×2 IMPLANT
WATER STERILE IRR 1000ML POUR (IV SOLUTION) ×2 IMPLANT

## 2014-05-28 NOTE — H&P (View-Only) (Signed)
    Established Dialysis Access  History of Present Illness  [name] is a 58 y.o. (1956-05-12) male who presents for follow-up of his right radiocephalic AV fistula performed on 01/10/14 by Dr. Scot Dock. He currently receives dialysis through his right IJ catheter on Mondays, Wednesdays, and Fridays. He denies any steal symptoms. He does note intermittent numbness in his hands bilaterally.   The patient's PMH, PSH, SH, FamHx, Med, and Allergies are unchanged from 03/27/14.  On ROS today: he denies hand pain, cramping or non healing wounds.   Physical Examination  Filed Vitals:   05/08/14 1503  BP: 109/70  Pulse: 87  Resp: 18  Height: 6' (1.829 m)  Weight: 302 lb (136.986 kg)   Body mass index is 40.95 kg/(m^2).  General: A&O x 3, WD obese male in NAD  Eyes: Pupils equal  Pulmonary: Non labored breathing.   Vascular: Vessel Right Left  Radial Palpable Palpable  Ulnar Palpable Palpable   Musculoskeletal: M/S 5/5 upper extremities. Extremities without  ischemic changes. Palpable thrill in right radiocephalic fistula.   Neurologic: Pain and light touch intact in extremities  Non-Invasive Vascular Imaging  right arm Access Duplex  (Date: 05/08/2014):   Diameters:  .32 cm  Depth:  .42 cm  PSV:  238 c/s  Medical Decision Making  [name] is a 58 y.o. male who presents with ESRD requiring hemodialysis.   Based on today's duplex studies, his right radiocephalic fistula is not ready for use.  He denies any steal symptoms.   Recommended fistulogram for evaluation of access. Discussed procedure, risks and benefits of procedure.  The patient has agreed to proceed with the above procedure which will be scheduled on an upcoming Tuesday or Thursday. He receives dialysis on Mondays, Wednesdays and Fridays.   Virgina Jock, PA-C [facility] [place] Office: [phone]  Agree with above. His duplex today shows elevated velocities in  the proximal fistula in addition to a large branch just below the antecubital level. Diameters at this point are not adequate. There is no significant plaque or thrombus noted. Given that the fistula to this point has not matured adequately I have recommended that we proceed with a fistulogram. He dialyzes on Monday Wednesdays and Fridays so we will schedule this on a Tuesday or Thursday.  Deitra Mayo, MD, [place] [phone] 05/08/2014   Pager: [phone]  05/08/2014, 3:34 PM

## 2014-05-28 NOTE — Op Note (Signed)
    NAME: [name]   MRN: [id number] DOB: 03-25-1956    DATE OF OPERATION: 05/28/2014  PREOP DIAGNOSIS: stage V chronic kidney disease  POSTOP DIAGNOSIS: same  PROCEDURE: ligation of 4 competing branches of right radialcephalic AV fistula  SURGEON: Judeth Cornfield. Scot Dock, MD, FACS  ASSIST: none  ANESTHESIA: local with sedation   EBL: minimal  INDICATIONS: [name] is a 58 y.o. male who presents with a poorly functioning right radial cephalic AV fistula. He underwent a fistulogram which showed no real problems except for some competing branches. He is brought in for ligation of his competing branches.  FINDINGS: by duplex identified for competing branches which were ligated through separate transverse incisions. In the more proximal forearm the patient looks like he had a previous infiltrate.  TECHNIQUE: The patient was taken to the operating room and sedated by anesthesia. The right upper extremity was prepped and draped in usual sterile fashion. Using the ultrasound scanner identified for competing branches which were marked. After the skin was anesthetized, a transverse incision was made over each area at each level the competing branch was dissected free and ligated between 3-0 silk ties and divided. In addition the vein was deep to the fascia and I mobilized the fascia superiorly and inferiorly through each of the incisions. At the 2 incisions in the distal forearm there was some hematoma present suggesting that he had previously had an infiltrate. Hemostasis was obtained in the wounds. Each of the wounds closed with 4-0 subcuticular stitch. Dermabond was applied. Patient tolerated the procedure well was transferred to the recovery room in stable condition. All needle and sponge counts were correct.  Deitra Mayo, MD, FACS [facility]  DATE OF DICTATION:   05/28/2014

## 2014-05-28 NOTE — Transfer of Care (Signed)
Immediate Anesthesia Transfer of Care Note  Patient: [name]  Procedure(s) Performed: Procedure(s): LIGATION OF COMPETING BRANCHES OF ARTERIOVENOUS FISTULA (Right)  Patient Location: PACU  Anesthesia Type:MAC  Level of Consciousness: awake, alert  and oriented  Airway & Oxygen Therapy: Patient Spontanous Breathing and Patient connected to nasal cannula oxygen  Post-op Assessment: Report given to PACU RN, Post -op Vital signs reviewed and stable and Patient moving all extremities X 4  Post vital signs: Reviewed and stable  Complications: No apparent anesthesia complications

## 2014-05-28 NOTE — Telephone Encounter (Addendum)
Message copied by Gena Fray on Tue May 28, 2014  2:26 PM ------      Message from: Denman George      Created: Tue May 28, 2014  1:26 PM      Regarding: Steph log; and needs 6 wk duplex of access with office visit                   ----- Message -----         From: Angelia Mould, MD         Sent: 05/28/2014   9:04 AM           To: Vvs Charge Pool      Subject: charge and f/u                                           PROCEDURE: ligation of 4 competing branches of right radialcephalic AV fistula                  SURGEON: Judeth Cornfield. Scot Dock, MD, FACS                  ASSIST: none            He will need a follow up visit at proximally 6 weeks with a duplex at that time of his right radial cephalic AV fistula. Thank you. CD ------  05/28/14: spoke with pt to schedule appointment, dpm

## 2014-05-28 NOTE — Anesthesia Postprocedure Evaluation (Signed)
Anesthesia Post Note  Patient: [name]  Procedure(s) Performed: Procedure(s) (LRB): LIGATION OF COMPETING BRANCHES OF ARTERIOVENOUS FISTULA (Right)  Anesthesia type: general  Patient location: PACU  Post pain: Pain level controlled  Post assessment: Patient's Cardiovascular Status Stable  Last Vitals:  Filed Vitals:   05/28/14 0938  BP:   Pulse:   Temp: 36.4 C  Resp:     Post vital signs: Reviewed and stable  Level of consciousness: sedated  Complications: No apparent anesthesia complications

## 2014-05-28 NOTE — Interval H&P Note (Signed)
History and Physical Interval Note:  05/28/2014 7:22 AM  [name]  has presented today for surgery, with the diagnosis of Mechanical complication of other vascular device, implant, and graft   The various methods of treatment have been discussed with the patient and family. After consideration of risks, benefits and other options for treatment, the patient has consented to  Procedure(s): LIGATION OF COMPETING BRANCHES OF ARTERIOVENOUS FISTULA (Right) as a surgical intervention .  The patient's history has been reviewed, patient examined, no change in status, stable for surgery.  I have reviewed the patient's chart and labs.  Questions were answered to the patient's satisfaction.     Derrian Rodak S

## 2014-05-28 NOTE — Anesthesia Procedure Notes (Signed)
Procedure Name: MAC Date/Time: 05/28/2014 7:44 AM Performed by: Neldon Newport Pre-anesthesia Checklist: Timeout performed, Patient being monitored, Suction available, Emergency Drugs available and Patient identified Patient Re-evaluated:Patient Re-evaluated prior to inductionOxygen Delivery Method: Simple face mask Placement Confirmation: positive ETCO2

## 2014-05-28 NOTE — Progress Notes (Signed)
CALLED DR.OSSEY FOR SIGN OUT

## 2014-05-28 NOTE — Progress Notes (Signed)
Pt reports that he normally does not take Coreg until around 1100 in the morning. Dr. Conrad Rugby informed that pt did not take PTA, no new orders received. Pt also reports taking 8 units (1/2 normal dose) of 70/30 insulin last night.

## 2014-05-28 NOTE — Anesthesia Preprocedure Evaluation (Addendum)
Anesthesia Evaluation  Patient identified by MRN, date of birth, ID band Patient awake    Reviewed: Allergy & Precautions, H&P , NPO status , Patient's Chart, lab work & pertinent test results  Airway Mallampati: II TM Distance: >3 FB Neck ROM: full    Dental  (+) Dental Advidsory Given, Missing   Pulmonary sleep apnea , former smoker,          Cardiovascular hypertension, Pt. on medications +CHF     Neuro/Psych    GI/Hepatic   Endo/Other  diabetesMorbid obesity  Renal/GU ESRFRenal disease     Musculoskeletal   Abdominal   Peds  Hematology   Anesthesia Other Findings   Reproductive/Obstetrics                        Anesthesia Physical Anesthesia Plan  ASA: III  Anesthesia Plan: MAC   Post-op Pain Management:    Induction: Intravenous  Airway Management Planned: Natural Airway  Additional Equipment:   Intra-op Plan:   Post-operative Plan:   Informed Consent: I have reviewed the patients History and Physical, chart, labs and discussed the procedure including the risks, benefits and alternatives for the proposed anesthesia with the patient or authorized representative who has indicated his/her understanding and acceptance.   Dental Advisory Given  Plan Discussed with: CRNA and Surgeon  Anesthesia Plan Comments:        Anesthesia Quick Evaluation

## 2014-05-31 ENCOUNTER — Encounter (HOSPITAL_COMMUNITY): Payer: Self-pay | Admitting: Vascular Surgery

## 2014-06-07 ENCOUNTER — Other Ambulatory Visit: Payer: Self-pay | Admitting: Emergency Medicine

## 2014-06-07 DIAGNOSIS — G4733 Obstructive sleep apnea (adult) (pediatric): Secondary | ICD-10-CM

## 2014-06-18 ENCOUNTER — Other Ambulatory Visit: Payer: 59

## 2014-07-09 ENCOUNTER — Encounter: Payer: Self-pay | Admitting: Vascular Surgery

## 2014-07-09 ENCOUNTER — Ambulatory Visit (INDEPENDENT_AMBULATORY_CARE_PROVIDER_SITE_OTHER)
Admission: RE | Admit: 2014-07-09 | Discharge: 2014-07-09 | Disposition: A | Discharge status: Home / Self Care | Payer: 59 | Source: Ambulatory Visit | Attending: Emergency Medicine | Admitting: Emergency Medicine

## 2014-07-09 DIAGNOSIS — R911 Solitary pulmonary nodule: Secondary | ICD-10-CM

## 2014-07-10 ENCOUNTER — Other Ambulatory Visit (HOSPITAL_COMMUNITY): Payer: 59

## 2014-07-10 ENCOUNTER — Encounter: Payer: Self-pay | Admitting: Vascular Surgery

## 2014-07-10 ENCOUNTER — Ambulatory Visit (INDEPENDENT_AMBULATORY_CARE_PROVIDER_SITE_OTHER): Payer: 59 | Admitting: Vascular Surgery

## 2014-07-10 ENCOUNTER — Encounter: Payer: 59 | Admitting: Vascular Surgery

## 2014-07-10 ENCOUNTER — Ambulatory Visit (HOSPITAL_COMMUNITY)
Admission: RE | Admit: 2014-07-10 | Discharge: 2014-07-10 | Disposition: A | Discharge status: Home / Self Care | Payer: 59 | Source: Ambulatory Visit | Attending: Vascular Surgery | Admitting: Vascular Surgery

## 2014-07-10 VITALS — BP 125/82 | HR 81 | Ht 72.0 in | Wt 305.6 lb

## 2014-07-10 DIAGNOSIS — N186 End stage renal disease: Secondary | ICD-10-CM

## 2014-07-10 DIAGNOSIS — Z48812 Encounter for surgical aftercare following surgery on the circulatory system: Secondary | ICD-10-CM

## 2014-07-10 NOTE — Progress Notes (Signed)
   Patient name: [name] MRN: [id number] DOB: 1956/02/17 Sex: male  REASON FOR VISIT: follow up after ligation of competing branches a right radiocephalic AV fistula.  HPI: [name] is a 58 y.o. male who had a right radiocephalic AV fistula placed on 01/10/2014. Follow this he was noted to have multiple competing branches. He underwent ligation of 4 competing branches of his AV fistula on 05/28/2014. He comes in for a follow up visit. He is still using his catheter for dialysis.  REVIEW OF SYSTEMS: Valu.Nieves ] denotes positive finding; [  ] denotes negative finding  CARDIOVASCULAR:  [ ]  chest pain   [ ]  dyspnea on exertion    CONSTITUTIONAL:  [ ]  fever   [ ]  chills  PHYSICAL EXAM: Filed Vitals:   07/10/14 1132  BP: 125/82  Pulse: 81  Height: 6' (1.829 m)  Weight: 305 lb 9.6 oz (138.619 kg)  SpO2: 100%   Body mass index is 41.44 kg/(m^2). GENERAL: The patient is a well-nourished male, in no acute distress. The vital signs are documented above. CARDIOVASCULAR: There is a regular rate and rhythm. PULMONARY: There is good air exchange bilaterally without wheezing or rales. His right radiocephalic fistula has an excellent bruit and thrill. His incisions are healing nicely.  MEDICAL ISSUES:  End stage renal disease His right radiocephalic AV fistula is maturing nicely and has acceptable diameters and an acceptable depth. At this point I think he can be used for dialysis. Once we know that the fistula is working well arrangements can be made to have his catheter removed. I will see him back as needed   Edmonia Gonser S [facility] [place]: [phone]

## 2014-07-10 NOTE — Assessment & Plan Note (Signed)
His right radiocephalic AV fistula is maturing nicely and has acceptable diameters and an acceptable depth. At this point I think he can be used for dialysis. Once we know that the fistula is working well arrangements can be made to have his catheter removed. I will see him back as needed

## 2014-08-01 DIAGNOSIS — T829XXA Unspecified complication of cardiac and vascular prosthetic device, implant and graft, initial encounter: Secondary | ICD-10-CM | POA: Insufficient documentation

## 2014-08-15 ENCOUNTER — Ambulatory Visit (INDEPENDENT_AMBULATORY_CARE_PROVIDER_SITE_OTHER): Payer: 59 | Admitting: Emergency Medicine

## 2014-08-15 ENCOUNTER — Encounter: Payer: Self-pay | Admitting: Emergency Medicine

## 2014-08-15 VITALS — BP 130/60 | HR 87 | Temp 97.1°F | Ht 73.0 in | Wt 315.8 lb

## 2014-08-15 DIAGNOSIS — G4730 Sleep apnea, unspecified: Secondary | ICD-10-CM

## 2014-08-15 DIAGNOSIS — R911 Solitary pulmonary nodule: Secondary | ICD-10-CM

## 2014-08-15 NOTE — Progress Notes (Signed)
  HPI:  57 yo former smoker (11 pk-yrs) with CKD, HTN, DM, MGUS. He was admitted to [facility] 2/20-26 and dx with RUL HCAP. He was treated with abx and improved. He is now newly on dialysis. CT scan chest done 2/20 showed a 6mm RUL apical nodule. He is referred for hosp f/u and to follow the R apical nodule. PPD negative  He denies any cough or dyspnea. No wheezing. Remains very active.   ROV 08/15/14 - follow up for hx tobacco, suspected OSA, 55mm RUL nodule. The nodule has fully resolved on repeat CT scan 07/09/14.  He is doing well, some occasional cough but no dyspnea of exertional intolerance. Needs his PSG, split night, end of this month.    Filed Vitals:   08/15/14 0924  BP: 130/60  Pulse: 87  Temp: 97.1 F (36.2 C)  TempSrc: Oral  Height: 6\' 1"  (1.854 m)  Weight: 315 lb 12.8 oz (143.246 kg)  SpO2: 95%   Gen: Pleasant, overwt, in no distress,  normal affect  ENT: No lesions,  mouth clear,  oropharynx clear, no postnasal drip  Neck: No JVD, no TMG, no carotid bruits  Lungs: No use of accessory muscles, clear without rales or rhonchi  Cardiovascular: RRR, heart sounds normal, no murmur or gallops, no peripheral edema  Musculoskeletal: No deformities, no cyanosis or clubbing  Neuro: alert, non focal  Skin: Warm, no lesions or rashes   CT chest 10/1 --  COMPARISON: Chest CT 01/04/2014.  FINDINGS:  Mediastinum: Heart size is normal. There is no significant  pericardial fluid, thickening or pericardial calcification. Aberrant  right subclavian artery (normal anatomical variant) incidentally  noted. Right-sided internal jugular central venous catheter with tip  terminating in the right atrium. No pathologically enlarged  mediastinal or hilar lymph nodes. Please note that accurate  exclusion of hilar adenopathy is limited on noncontrast CT scans.  Esophagus is unremarkable in appearance.  Lungs/Pleura: Previously noted 6 mm nodule in the apex of the right  upper lobe has  resolved. Likewise, patchy areas of  peribronchovascular interstitial thickening and micronodularity in  the right upper lobe has also resolved compared to the prior  examination, indicative of a bronchopneumonia on the prior study. No  new suspicious appearing pulmonary nodules or masses. No new areas  of airspace consolidation. No pleural effusions.  Upper Abdomen: Unremarkable.  Musculoskeletal: There are no aggressive appearing lytic or blastic  lesions noted in the visualized portions of the skeleton.  IMPRESSION:  1. Previously noted right upper lobe nodule has resolved. No  suspicious nodules are noted on today's examination.  2. Resolution of previously noted right upper lobe bronchopneumonia.  3. Additional incidental findings, as above.    A/P:  Sleep apnea Needs his PSG end of October. Will likely start CPAP    Pulmonary nodule Resolved on CT scan from 8/'15

## 2014-08-15 NOTE — Patient Instructions (Signed)
Get your sleep study as planned We will order CPAP mask for you if needed. Follow with Dr Lamonte Sakai in 3 months or sooner if you have any problems.

## 2014-08-15 NOTE — Assessment & Plan Note (Signed)
Needs his PSG end of October. Will likely start CPAP

## 2014-08-15 NOTE — Assessment & Plan Note (Signed)
Resolved on CT scan from 8/'15

## 2014-08-16 ENCOUNTER — Other Ambulatory Visit: Payer: Self-pay | Admitting: Emergency Medicine

## 2014-08-16 ENCOUNTER — Telehealth: Payer: Self-pay | Admitting: Emergency Medicine

## 2014-08-16 DIAGNOSIS — G4710 Hypersomnia, unspecified: Secondary | ICD-10-CM

## 2014-08-16 DIAGNOSIS — G4730 Sleep apnea, unspecified: Principal | ICD-10-CM

## 2014-08-16 NOTE — Telephone Encounter (Signed)
Spoke to pt he is willing to do home sleep study i just need an order Joellen [place]

## 2014-08-16 NOTE — Telephone Encounter (Signed)
OK I will order

## 2014-09-09 ENCOUNTER — Telehealth: Payer: Self-pay | Admitting: Emergency Medicine

## 2014-09-09 NOTE — Telephone Encounter (Signed)
THE PROBLEM HAS BEEN THAT [facility] DENIED A IN LAB SLEEP STUDY SO THIS PT HAS TO DO A HOME STUDY PT AND WIFE ARE AWARE OF THIS AND THE IN LAB STUDY HAS BEEN CANCELLED AGAIN HE WILL PICK UP HOME STUDY EQUIPT 09/11/14 [name]

## 2014-09-09 NOTE — Telephone Encounter (Signed)
Spoke with pt wife Pt received letter stating he is scheduled for sleep study 09/10/14 at 8pm- pt wife called to confirm appt and was advised by sleep center that his appt was cancelled and this slot no longer existed. Pt rescheduled to Monday Nov 16 @ 8pm.  Will send to [place] to confirm that insurance is covering this test. Pt received letter stating coverage but I was advised to by sleep lab to still check with [facility] to confirm cov'g. Pt needs to be contacted once confirmed. Please advise. Thanks.

## 2014-09-10 ENCOUNTER — Encounter (HOSPITAL_BASED_OUTPATIENT_CLINIC_OR_DEPARTMENT_OTHER): Payer: 59

## 2014-09-11 ENCOUNTER — Encounter (HOSPITAL_BASED_OUTPATIENT_CLINIC_OR_DEPARTMENT_OTHER): Payer: 59

## 2014-09-11 ENCOUNTER — Ambulatory Visit: Payer: 59 | Admitting: Pulmonary Disease

## 2014-09-11 DIAGNOSIS — G4733 Obstructive sleep apnea (adult) (pediatric): Secondary | ICD-10-CM

## 2014-09-13 ENCOUNTER — Other Ambulatory Visit: Payer: Self-pay | Admitting: Pulmonary Disease

## 2014-09-13 ENCOUNTER — Telehealth: Payer: Self-pay | Admitting: Emergency Medicine

## 2014-09-13 DIAGNOSIS — G4733 Obstructive sleep apnea (adult) (pediatric): Secondary | ICD-10-CM

## 2014-09-13 NOTE — Telephone Encounter (Signed)
PCC's have ya'll tried calling pt? I don't see anything in epic. thanks

## 2014-09-13 NOTE — Telephone Encounter (Signed)
I spoke with [name] and advised him that order has been placed for a cpap titration study and we have scheduled this study. However, pt is needing the results of his home sleep test. [name]

## 2014-09-14 ENCOUNTER — Ambulatory Visit (INDEPENDENT_AMBULATORY_CARE_PROVIDER_SITE_OTHER): Payer: 59 | Admitting: Pulmonary Disease

## 2014-09-14 DIAGNOSIS — G4733 Obstructive sleep apnea (adult) (pediatric): Secondary | ICD-10-CM

## 2014-09-16 NOTE — Telephone Encounter (Signed)
The results were given to Vallarie Mare to be scanned, may not be in the record yet. When it shows up, please send to him

## 2014-09-16 NOTE — Telephone Encounter (Signed)
Sleep study results not seen in chart.  Dr. Lamonte Sakai please advise on this pt's sleep study results.  Thanks!

## 2014-09-17 ENCOUNTER — Encounter: Payer: Self-pay | Admitting: Emergency Medicine

## 2014-09-17 NOTE — Telephone Encounter (Signed)
Results in Epic.  Spoke with pt and advised of results.  Pt to have cpap titration study as scheduled.

## 2014-09-24 ENCOUNTER — Other Ambulatory Visit: Payer: Self-pay | Admitting: Emergency Medicine

## 2014-09-30 ENCOUNTER — Encounter (HOSPITAL_BASED_OUTPATIENT_CLINIC_OR_DEPARTMENT_OTHER): Payer: 59

## 2014-10-24 ENCOUNTER — Encounter (HOSPITAL_COMMUNITY): Payer: Self-pay | Admitting: Surgery

## 2014-12-03 ENCOUNTER — Telehealth: Payer: Self-pay | Admitting: Emergency Medicine

## 2014-12-03 DIAGNOSIS — G4730 Sleep apnea, unspecified: Secondary | ICD-10-CM

## 2014-12-03 NOTE — Telephone Encounter (Signed)
Pt last seen 10.1.15 by RB: Patient Instructions       Get your sleep study as planned We will order CPAP mask for you if needed. Follow with Dr Lamonte Sakai in 3 months or sooner if you have any problems.   Home sleep study was done - recs were to do a titration study in the sleep lab to determine CPAP vs BiPAP and w/ or w/o O2 Per [place], insurance will not pay for a CPAP titration > pt will need to be set up on a CPAP first on auto.  RB please advise, thank you.

## 2014-12-04 ENCOUNTER — Encounter (HOSPITAL_BASED_OUTPATIENT_CLINIC_OR_DEPARTMENT_OTHER): Payer: 59

## 2014-12-04 NOTE — Telephone Encounter (Signed)
Ok please set up an auto-set home study

## 2014-12-04 NOTE — Telephone Encounter (Signed)
Order place for new start CPAP on auto. Will forward to [place].

## 2014-12-05 NOTE — Telephone Encounter (Signed)
Order faxed to [place]

## 2014-12-26 ENCOUNTER — Other Ambulatory Visit: Payer: Self-pay | Admitting: Emergency Medicine

## 2015-01-17 ENCOUNTER — Ambulatory Visit: Payer: 59 | Admitting: Emergency Medicine

## 2015-01-27 ENCOUNTER — Telehealth: Payer: Self-pay | Admitting: Hematology

## 2015-01-27 NOTE — Telephone Encounter (Signed)
CALLED SHIRLEE AT [facility] TO INFORM HER THAT PATIENT HAD TOO MANY NO SHOWS AND CANCELLATIONS TO BE RESCHEDULED IN OUR OFFICE.

## 2015-02-04 ENCOUNTER — Telehealth: Payer: Self-pay | Admitting: Cardiovascular Disease

## 2015-02-05 NOTE — Telephone Encounter (Signed)
Closed encounter °

## 2015-03-13 ENCOUNTER — Encounter: Payer: Self-pay | Admitting: Emergency Medicine

## 2015-03-13 ENCOUNTER — Ambulatory Visit (INDEPENDENT_AMBULATORY_CARE_PROVIDER_SITE_OTHER): Payer: 59 | Admitting: Emergency Medicine

## 2015-03-13 VITALS — BP 110/70 | HR 91 | Ht 73.0 in | Wt 330.0 lb

## 2015-03-13 DIAGNOSIS — G4730 Sleep apnea, unspecified: Secondary | ICD-10-CM | POA: Diagnosis not present

## 2015-03-13 NOTE — Patient Instructions (Signed)
We will discuss with your CPAP company the potential to increase the humidity on your machine.  Follow with Dr Lamonte Sakai in 12 months or sooner if you have any problems

## 2015-03-13 NOTE — Assessment & Plan Note (Signed)
He feels better on CPAP and tries to be compliant. The biggest barrier right now is problems with dry throat after several hours. He will check to see if the humidity can be increased on his machine and we will inquire with his DME to see if this possible. It may be that he needs another machine if he is unable to change the humidity on this one

## 2015-03-13 NOTE — Progress Notes (Signed)
HPI:  59 yo former smoker (11 pk-yrs) with CKD, HTN, DM, MGUS. He was admitted to [facility] 2/20-26 and dx with RUL HCAP. He was treated with abx and improved. He is now newly on dialysis. CT scan chest done 2/20 showed a 24mm RUL apical nodule. He is referred for hosp f/u and to follow the R apical nodule. PPD negative  He denies any cough or dyspnea. No wheezing. Remains very active.   ROV 08/15/14 - follow up for hx tobacco, suspected OSA, 37mm RUL nodule. The nodule has fully resolved on repeat CT scan 07/09/14.  He is doing well, some occasional cough but no dyspnea of exertional intolerance. Needs his PSG, split night, end of this month.   ROV 03/13/15 -- follow up visit for OSA. We had followed a 59mm nodule that resolved on subsequent CT. Former smoker. He is wearing CPAP - knows that it is helping him. He takes it off after a few hours because his throat is getting so dry. He is using humidity, ? Setting. His DME is ? [employer]. He is on HD, wt is up about 15 lbs since our last visit. His exertional tolerance is good. He does admit to some daytime naps, especially when he hasn't worn his CPAP.    Filed Vitals:   03/13/15 0908  BP: 110/70  Pulse: 91  Height: 6\' 1"  (1.854 m)  Weight: 330 lb (149.687 kg)  SpO2: 94%   Gen: Pleasant, overwt, in no distress,  normal affect  ENT: No lesions,  mouth clear,  oropharynx clear, no postnasal drip  Neck: No JVD, no TMG, no carotid bruits  Lungs: No use of accessory muscles, clear without rales or rhonchi  Cardiovascular: RRR, heart sounds normal, no murmur or gallops, no peripheral edema  Musculoskeletal: No deformities, no cyanosis or clubbing  Neuro: alert, non focal  Skin: Warm, no lesions or rashes   CT chest 10/1 --  COMPARISON: Chest CT 01/04/2014.  FINDINGS:  Mediastinum: Heart size is normal. There is no significant  pericardial fluid, thickening or pericardial calcification. Aberrant  right subclavian artery (normal anatomical  variant) incidentally  noted. Right-sided internal jugular central venous catheter with tip  terminating in the right atrium. No pathologically enlarged  mediastinal or hilar lymph nodes. Please note that accurate  exclusion of hilar adenopathy is limited on noncontrast CT scans.  Esophagus is unremarkable in appearance.  Lungs/Pleura: Previously noted 6 mm nodule in the apex of the right  upper lobe has resolved. Likewise, patchy areas of  peribronchovascular interstitial thickening and micronodularity in  the right upper lobe has also resolved compared to the prior  examination, indicative of a bronchopneumonia on the prior study. No  new suspicious appearing pulmonary nodules or masses. No new areas  of airspace consolidation. No pleural effusions.  Upper Abdomen: Unremarkable.  Musculoskeletal: There are no aggressive appearing lytic or blastic  lesions noted in the visualized portions of the skeleton.  IMPRESSION:  1. Previously noted right upper lobe nodule has resolved. No  suspicious nodules are noted on today's examination.  2. Resolution of previously noted right upper lobe bronchopneumonia.  3. Additional incidental findings, as above.    A/P:  Sleep apnea He feels better on CPAP and tries to be compliant. The biggest barrier right now is problems with dry throat after several hours. He will check to see if the humidity can be increased on his machine and we will inquire with his DME to see if this possible. It may  be that he needs another machine if he is unable to change the humidity on this one

## 2015-04-03 ENCOUNTER — Telehealth: Payer: Self-pay

## 2015-04-03 DIAGNOSIS — M79601 Pain in right arm: Secondary | ICD-10-CM

## 2015-04-03 DIAGNOSIS — T829XXA Unspecified complication of cardiac and vascular prosthetic device, implant and graft, initial encounter: Secondary | ICD-10-CM

## 2015-04-03 NOTE — Telephone Encounter (Signed)
Phone call from pt's wife.  Reported pt. Is c/o increased pain in the area of his fistula before and after treatment.  Stated this has been occurring x 1.5 weeks.  Deneids pt. Has fever/ chills, redness/warmth @ AVF site, or drainage.  Also, denied swelling in arm.  Stated there are "3 little knots" where they stick his fistula.  Discussed with Dr. Oneida Alar.  Recommended to schedule duplex of access and office visit.

## 2015-04-04 ENCOUNTER — Encounter: Payer: Self-pay | Admitting: Vascular Surgery

## 2015-04-08 ENCOUNTER — Encounter: Payer: Self-pay | Admitting: Vascular Surgery

## 2015-04-08 ENCOUNTER — Ambulatory Visit (HOSPITAL_COMMUNITY)
Admission: RE | Admit: 2015-04-08 | Discharge: 2015-04-08 | Disposition: A | Discharge status: Home / Self Care | Payer: 59 | Source: Ambulatory Visit | Attending: Vascular Surgery | Admitting: Vascular Surgery

## 2015-04-08 ENCOUNTER — Ambulatory Visit (INDEPENDENT_AMBULATORY_CARE_PROVIDER_SITE_OTHER): Payer: 59 | Admitting: Vascular Surgery

## 2015-04-08 VITALS — BP 164/88 | HR 62 | Resp 22 | Ht 73.0 in | Wt 335.0 lb

## 2015-04-08 DIAGNOSIS — N186 End stage renal disease: Secondary | ICD-10-CM

## 2015-04-08 DIAGNOSIS — M79601 Pain in right arm: Secondary | ICD-10-CM | POA: Diagnosis not present

## 2015-04-08 DIAGNOSIS — Z9889 Other specified postprocedural states: Secondary | ICD-10-CM | POA: Diagnosis not present

## 2015-04-08 DIAGNOSIS — Y841 Kidney dialysis as the cause of abnormal reaction of the patient, or of later complication, without mention of misadventure at the time of the procedure: Secondary | ICD-10-CM | POA: Diagnosis not present

## 2015-04-08 DIAGNOSIS — T829XXA Unspecified complication of cardiac and vascular prosthetic device, implant and graft, initial encounter: Secondary | ICD-10-CM

## 2015-04-08 NOTE — Progress Notes (Signed)
Subjective:     Patient ID: [name], male   DOB: August 04, 1956, 59 y.o.   MRN: [id number]  HPI patient has end-stage renal disease and right radial-cephalic AV fistula which was placed February 2015. Fistula has functioned nicely. Earlier this week when it was accessed he had severe pain at the puncture site in the mid forearm which is [name] this is ever occurred. He also complains of occasional aching discomfort near the shoulder level. There have been good flows with the fistula. He's had no evidence of infection or bleeding.  Past Medical History  Diagnosis Date  . Diabetes mellitus   . Hypertension   . Hyperlipidemia   . Gout   . Alcohol abuse   . Tobacco abuse   . Renal insufficiency   . Congestive heart disease 12/2011  . Hyperlipidemia   . Monoclonal gammopathies 02/25/2012  . Pedal edema     History  Substance Use Topics  . Smoking status: Former Smoker -- 0.30 packs/day for 33 years    Types: Cigarettes    Quit date: 07/16/2010  . Smokeless tobacco: Never Used  . Alcohol Use: 21.0 oz/week    42 drink(s) per week    Family History  Problem Relation Age of Onset  . Lung cancer Father   . Diabetes Mother   . Heart disease Mother     Allergies  Allergen Reactions  . Penicillins Nausea And Vomiting and Other (See Comments)    Stomach pains also  . Vioxx [Rofecoxib] Nausea Only and Other (See Comments)    Gi upset.     Current outpatient prescriptions:  .  allopurinol (ZYLOPRIM) 100 MG tablet, Take 100 mg by mouth 2 (two) times daily as needed. , Disp: , Rfl:  .  aspirin EC 81 MG tablet, Take 81 mg by mouth daily., Disp: , Rfl:  .  carvedilol (COREG) 6.25 MG tablet, Take 1 tablet (6.25 mg total) by mouth 2 (two) times daily with a meal., Disp: 60 tablet, Rfl: 0 .  cinacalcet (SENSIPAR) 30 MG tablet, Take 30 mg by mouth daily., Disp: , Rfl:  .  indomethacin (INDOCIN) 50 MG capsule, Take 50 mg by mouth 2 (two) times daily as needed (gout flair up). , Disp: ,  Rfl:  .  insulin lispro (HUMALOG) 100 UNIT/ML cartridge, Inject into the skin 3 (three) times daily with meals., Disp: , Rfl:  .  insulin NPH Human (HUMULIN N,NOVOLIN N) 100 UNIT/ML injection, Inject into the skin at bedtime., Disp: , Rfl:  .  lanthanum (FOSRENOL) 1000 MG chewable tablet, Chew 1,000 mg by mouth 2 (two) times daily with a meal., Disp: , Rfl:  .  loratadine (CLARITIN) 10 MG tablet, TAKE 1 TABLET EVERY DAY, Disp: 30 tablet, Rfl: 1 .  polyvinyl alcohol-povidone (REFRESH) 1.4-0.6 % ophthalmic solution, 1-2 drops as needed., Disp: , Rfl:  .  pravastatin (PRAVACHOL) 40 MG tablet, Take 40 mg by mouth daily., Disp: , Rfl:  .  Insulin Lispro Prot & Lispro (HUMALOG MIX 75/25 KWIKPEN [place]), Inject 16 Units into the skin 2 (two) times daily., Disp: , Rfl:   Filed Vitals:   04/08/15 1540 04/08/15 1541  BP: 167/94 164/88  Pulse: 62 62  Resp: 24 22  Height: 6\' 1"  (1.854 m)   Weight: 335 lb (151.955 kg)     Body mass index is 44.21 kg/(m^2).           Review of Systems denies chest pain, dyspnea on exertion, claudication.  Objective:   Physical Exam BP 164/88 mmHg  Pulse 62  Resp 22  Ht 6\' 1"  (1.854 m)  Wt 335 lb (151.955 kg)  BMI 44.21 kg/m2  General alert and oriented 3 in no apparent distress Right upper extremity with excellent pulse and palpable thrill in forearm radial-cephalic AV fistula a few areas of dilatation but no pseudoaneurysm No evidence of infection or fluctuance Right hand well perfused  Today I ordered a duplex scan of the fistula in the right forearm which I reviewed and interpreted. There is excellent flow throughout the fistula. No competing branches are noted. No obvious    abnormalities are noted  Assessment:     patient with end-stage renal disease with nicely functioning right radial-cephalic AV fistula   1 episode of severe pain at puncture site earlier this week-no abnormalities noted on duplex scanning and physical exam    Would  recommend avoiding the site where the pain occurred in the right mid forearm. Do not know the etiology of aching discomfort in the proximal right upper arm near shoulder level. Fistula is functioning well. Return to see me on when necessary basis

## 2015-04-08 NOTE — Progress Notes (Signed)
Filed Vitals:   04/08/15 1540 04/08/15 1541  BP: 167/94 164/88  Pulse: 62 62  Resp: 24 22  Height: 6\' 1"  (1.854 m)   Weight: 335 lb (151.955 kg)

## 2015-05-06 ENCOUNTER — Ambulatory Visit (INDEPENDENT_AMBULATORY_CARE_PROVIDER_SITE_OTHER): Payer: 59 | Admitting: Cardiovascular Disease

## 2015-05-06 ENCOUNTER — Encounter: Payer: Self-pay | Admitting: Cardiovascular Disease

## 2015-05-06 VITALS — BP 106/68 | HR 80 | Ht 74.0 in | Wt 337.0 lb

## 2015-05-06 DIAGNOSIS — I10 Essential (primary) hypertension: Secondary | ICD-10-CM | POA: Diagnosis not present

## 2015-05-06 DIAGNOSIS — E785 Hyperlipidemia, unspecified: Secondary | ICD-10-CM

## 2015-05-06 NOTE — Patient Instructions (Signed)
We request that you follow-up in: 1 year  with an extender and in 2 years with Dr Andria Rhein will receive a reminder letter in the mail two months in advance. If you don't receive a letter, please call our office to schedule the follow-up appointment.

## 2015-05-06 NOTE — Progress Notes (Signed)
05/06/2015 [name]   Jan 06, 1956  [id number]  Primary Physician Gara Kroner, MD Primary Cardiologist: Lorretta Harp MD Renae Gloss   HPI:  [name] is a delightful 59 year old moderately overweight married African American male father of 2 children, grandfather of her grandchildren He was seen for cardiovascular evaluation because of risk factors. He works Careers adviser. His cardiac risk factors are remarkable for treated hypertension, hyperlipidemia and diabetes. His sister apparently myocardial infarction. He has never had a heart or stroke and denies chest pain or shortness of breath. He did start hemodialysis and back in February 2015 as a fistula in his right upper extremity. He is also being evaluated for obstructive sleep apnea. He carries a diagnosis of MGUS. Since I saw him a year ago he's remained stable without symptoms.   Current Outpatient Prescriptions  Medication Sig Dispense Refill  . allopurinol (ZYLOPRIM) 100 MG tablet Take 100 mg by mouth 2 (two) times daily as needed.     Marland Kitchen amLODipine (NORVASC) 5 MG tablet Take 5 mg by mouth daily.  5  . aspirin EC 81 MG tablet Take 81 mg by mouth daily.    . carvedilol (COREG) 6.25 MG tablet Take 1 tablet (6.25 mg total) by mouth 2 (two) times daily with a meal. 60 tablet 0  . cinacalcet (SENSIPAR) 30 MG tablet Take 30 mg by mouth daily.    . indomethacin (INDOCIN) 50 MG capsule Take 50 mg by mouth 2 (two) times daily as needed (gout flair up).     . insulin lispro (HUMALOG) 100 UNIT/ML cartridge Inject into the skin 3 (three) times daily with meals.    . Insulin Lispro Prot & Lispro (HUMALOG MIX 75/25 KWIKPEN [place]) Inject 16 Units into the skin 2 (two) times daily.    . insulin NPH Human (HUMULIN N,NOVOLIN N) 100 UNIT/ML injection Inject into the skin at bedtime.    Marland Kitchen lanthanum (FOSRENOL) 1000 MG chewable tablet Chew 1,000 mg by mouth 2 (two) times daily with a meal.    . latanoprost (XALATAN) 0.005 %  ophthalmic solution Place 1 drop into both eyes at bedtime.  12  . loratadine (CLARITIN) 10 MG tablet TAKE 1 TABLET EVERY DAY 30 tablet 1  . polyvinyl alcohol-povidone (REFRESH) 1.4-0.6 % ophthalmic solution 1-2 drops as needed.    . pravastatin (PRAVACHOL) 40 MG tablet Take 40 mg by mouth daily.     No current facility-administered medications for this visit.    Allergies  Allergen Reactions  . Penicillins Nausea And Vomiting and Other (See Comments)    Stomach pains also  . Vioxx [Rofecoxib] Nausea Only and Other (See Comments)    Gi upset.    History   Social History  . Marital Status: Married    Spouse Name: [name]  . Number of Children: 2  . Years of Education: 12   Occupational History  . Security    Social History Main Topics  . Smoking status: Former Smoker -- 0.30 packs/day for 33 years    Types: Cigarettes    Quit date: 07/16/2010  . Smokeless tobacco: Never Used  . Alcohol Use: 21.0 oz/week    42 drink(s) per week  . Drug Use: No  . Sexual Activity: No   Other Topics Concern  . Not on file   Social History Narrative   Married, lives in [place] with wife.     Review of Systems: General: negative for chills, fever, night sweats or weight changes.  Cardiovascular: negative for chest pain, dyspnea on exertion, edema, orthopnea, palpitations, paroxysmal nocturnal dyspnea or shortness of breath Dermatological: negative for rash Respiratory: negative for cough or wheezing Urologic: negative for hematuria Abdominal: negative for nausea, vomiting, diarrhea, bright red blood per rectum, melena, or hematemesis Neurologic: negative for visual changes, syncope, or dizziness All other systems reviewed and are otherwise negative except as noted above.    Blood pressure 106/68, pulse 80, height 6\' 2"  (1.88 m), weight 337 lb (152.862 kg).  General appearance: alert and no distress Neck: no adenopathy, no carotid bruit, no JVD, supple, symmetrical, trachea midline and  thyroid not enlarged, symmetric, no tenderness/mass/nodules Lungs: clear to auscultation bilaterally Heart: regular rate and rhythm, S1, S2 normal, no murmur, click, rub or gallop Extremities: extremities normal, atraumatic, no cyanosis or edema  EKG normal sinus rhythm at 80 with left axis deviation, left atrial enlargement and nonspecific ST and T-wave changes. There were inferior Q waves. I personally reviewed this EKG  ASSESSMENT AND PLAN:   Hypertension History of hypertension blood pressure measured at 106/68. He is on amlodipine, carvedilol. Continue current meds at current dosing  Hyperlipidemia History of hyperlipidemia on pravastatin followed by his PCP      Lorretta Harp MD [facility], [facility] 05/06/2015 8:20 AM

## 2015-05-06 NOTE — Assessment & Plan Note (Signed)
History of hyperlipidemia on pravastatin followed by his PCP 

## 2015-05-06 NOTE — Assessment & Plan Note (Signed)
History of hypertension blood pressure measured at 106/68. He is on amlodipine, carvedilol. Continue current meds at current dosing

## 2015-09-03 DIAGNOSIS — Z992 Dependence on renal dialysis: Secondary | ICD-10-CM | POA: Insufficient documentation

## 2015-09-16 ENCOUNTER — Encounter: Payer: Self-pay | Admitting: Podiatry

## 2015-09-16 ENCOUNTER — Ambulatory Visit (INDEPENDENT_AMBULATORY_CARE_PROVIDER_SITE_OTHER): Payer: 59 | Admitting: Podiatry

## 2015-09-16 VITALS — BP 133/79 | HR 96 | Resp 12

## 2015-09-16 DIAGNOSIS — M79674 Pain in right toe(s): Secondary | ICD-10-CM | POA: Diagnosis not present

## 2015-09-16 DIAGNOSIS — E1140 Type 2 diabetes mellitus with diabetic neuropathy, unspecified: Secondary | ICD-10-CM

## 2015-09-16 DIAGNOSIS — M79675 Pain in left toe(s): Secondary | ICD-10-CM | POA: Diagnosis not present

## 2015-09-16 DIAGNOSIS — B351 Tinea unguium: Secondary | ICD-10-CM | POA: Diagnosis not present

## 2015-09-16 NOTE — Patient Instructions (Signed)
Diabetes and Foot Care Diabetes may cause you to have problems because of poor blood supply (circulation) to your feet and legs. This may cause the skin on your feet to become thinner, break easier, and heal more slowly. Your skin may become dry, and the skin may peel and crack. You may also have nerve damage in your legs and feet causing decreased feeling in them. You may not notice minor injuries to your feet that could lead to infections or more serious problems. Taking care of your feet is one of the most important things you can do for yourself.  HOME CARE INSTRUCTIONS  Wear shoes at all times, even in the house. Do not go barefoot. Bare feet are easily injured.  Check your feet daily for blisters, cuts, and redness. If you cannot see the bottom of your feet, use a mirror or ask someone for help.  Wash your feet with warm water (do not use hot water) and mild soap. Then pat your feet and the areas between your toes until they are completely dry. Do not soak your feet as this can dry your skin.  Apply a moisturizing lotion or petroleum jelly (that does not contain alcohol and is unscented) to the skin on your feet and to dry, brittle toenails. Do not apply lotion between your toes.  Trim your toenails straight across. Do not dig under them or around the cuticle. File the edges of your nails with an emery board or nail file.  Do not cut corns or calluses or try to remove them with medicine.  Wear clean socks or stockings every day. Make sure they are not too tight. Do not wear knee-high stockings since they may decrease blood flow to your legs.  Wear shoes that fit properly and have enough cushioning. To break in new shoes, wear them for just a few hours a day. This prevents you from injuring your feet. Always look in your shoes before you put them on to be sure there are no objects inside.  Do not cross your legs. This may decrease the blood flow to your feet.  If you find a minor scrape,  cut, or break in the skin on your feet, keep it and the skin around it clean and dry. These areas may be cleansed with mild soap and water. Do not cleanse the area with peroxide, alcohol, or iodine.  When you remove an adhesive bandage, be sure not to damage the skin around it.  If you have a wound, look at it several times a day to make sure it is healing.  Do not use heating pads or hot water bottles. They may burn your skin. If you have lost feeling in your feet or legs, you may not know it is happening until it is too late.  Make sure your health care provider performs a complete foot exam at least annually or more often if you have foot problems. Report any cuts, sores, or bruises to your health care provider immediately. SEEK MEDICAL CARE IF:   You have an injury that is not healing.  You have cuts or breaks in the skin.  You have an ingrown nail.  You notice redness on your legs or feet.  You feel burning or tingling in your legs or feet.  You have pain or cramps in your legs and feet.  Your legs or feet are numb.  Your feet always feel cold. SEEK IMMEDIATE MEDICAL CARE IF:   There is increasing redness,   swelling, or pain in or around a wound.  There is a red line that goes up your leg.  Pus is coming from a wound.  You develop a fever or as directed by your health care provider.  You notice a bad smell coming from an ulcer or wound.   This information is not intended to replace advice given to you by your health care provider. Make sure you discuss any questions you have with your health care provider.   Document Released: 10/29/2000 Document Revised: 07/04/2013 Document Reviewed: 04/10/2013 Elsevier Interactive Patient Education 2016 [employer]  

## 2015-09-16 NOTE — Progress Notes (Signed)
   Subjective:    Patient ID: [name], male    DOB: [birth year], 59 y.o.   MRN: [id number]  HPI   This patient presents today concerned about thick and discolored toenails that are gradually increasing in thickness and deformity over the past year. Nails are cough walks and wearing shoes. He is attempted to the nails himself, however, not able to reduce the discomfort in the nails. Is also complaining of dry skin and has used Vaseline in the past.  He is a diabetic and denies any history of foot ulceration, claudication or amputation Works as a Warden/ranger occupation  Review of Systems  Eyes: Positive for redness.  Genitourinary: Positive for dysuria.  Musculoskeletal: Positive for back pain.       Objective:   Physical Exam  Orientated 3  Vascular: DP pulses 1/4 right and 2/4 left PT pulses 0/4 right and 1/4 left Capillary reflex immediate bilaterally  Neurological: Sensation to 10 g monofilament wire intact 2/5 bilaterally Vibratory sensation nonreactive bilaterally Ankle reflex equal and reactive bilaterally  Dermatological: Dry skin bilaterally The toenails are hypertrophic, discolored, incurvated, deformed, and tender direct palpation 6-10 No open skin lesions bilaterally  Musculoskeletal: There is no restriction ankle, subtalar, midtarsal joints bilaterally      Assessment & Plan:   Assessment: Diabetic peripheral neuropathy Diabetic peripheral arterial disease Symptomatic onychomycoses 6-10 Dry skin bilaterally  Plan: Today I reviewed the results of examination with patient today and gave him general information about diabetic foot care We discussed treatment of mycotic toenails and today I recommended debridement. He consents for the debridement The toenails 10 are debrided mechanically and electronically without a bleeding Patient advised to use all-purpose skin lotion twice a day for dry skin  Reappoint 3 months

## 2015-12-30 ENCOUNTER — Ambulatory Visit: Payer: 59 | Admitting: Podiatry

## 2016-01-21 ENCOUNTER — Encounter: Payer: Self-pay | Admitting: Podiatry

## 2016-01-21 ENCOUNTER — Ambulatory Visit (INDEPENDENT_AMBULATORY_CARE_PROVIDER_SITE_OTHER): Payer: BLUE CROSS/BLUE SHIELD | Admitting: Podiatry

## 2016-01-21 DIAGNOSIS — E1140 Type 2 diabetes mellitus with diabetic neuropathy, unspecified: Secondary | ICD-10-CM

## 2016-01-21 DIAGNOSIS — B351 Tinea unguium: Secondary | ICD-10-CM

## 2016-01-21 DIAGNOSIS — M79674 Pain in right toe(s): Secondary | ICD-10-CM | POA: Diagnosis not present

## 2016-01-21 DIAGNOSIS — M79675 Pain in left toe(s): Secondary | ICD-10-CM

## 2016-01-21 NOTE — Patient Instructions (Signed)
Diabetes and Foot Care Diabetes may cause you to have problems because of poor blood supply (circulation) to your feet and legs. This may cause the skin on your feet to become thinner, break easier, and heal more slowly. Your skin may become dry, and the skin may peel and crack. You may also have nerve damage in your legs and feet causing decreased feeling in them. You may not notice minor injuries to your feet that could lead to infections or more serious problems. Taking care of your feet is one of the most important things you can do for yourself.  HOME CARE INSTRUCTIONS  Wear shoes at all times, even in the house. Do not go barefoot. Bare feet are easily injured.  Check your feet daily for blisters, cuts, and redness. If you cannot see the bottom of your feet, use a mirror or ask someone for help.  Wash your feet with warm water (do not use hot water) and mild soap. Then pat your feet and the areas between your toes until they are completely dry. Do not soak your feet as this can dry your skin.  Apply a moisturizing lotion or petroleum jelly (that does not contain alcohol and is unscented) to the skin on your feet and to dry, brittle toenails. Do not apply lotion between your toes.  Trim your toenails straight across. Do not dig under them or around the cuticle. File the edges of your nails with an emery board or nail file.  Do not cut corns or calluses or try to remove them with medicine.  Wear clean socks or stockings every day. Make sure they are not too tight. Do not wear knee-high stockings since they may decrease blood flow to your legs.  Wear shoes that fit properly and have enough cushioning. To break in new shoes, wear them for just a few hours a day. This prevents you from injuring your feet. Always look in your shoes before you put them on to be sure there are no objects inside.  Do not cross your legs. This may decrease the blood flow to your feet.  If you find a minor scrape,  cut, or break in the skin on your feet, keep it and the skin around it clean and dry. These areas may be cleansed with mild soap and water. Do not cleanse the area with peroxide, alcohol, or iodine.  When you remove an adhesive bandage, be sure not to damage the skin around it.  If you have a wound, look at it several times a day to make sure it is healing.  Do not use heating pads or hot water bottles. They may burn your skin. If you have lost feeling in your feet or legs, you may not know it is happening until it is too late.  Make sure your health care provider performs a complete foot exam at least annually or more often if you have foot problems. Report any cuts, sores, or bruises to your health care provider immediately. SEEK MEDICAL CARE IF:   You have an injury that is not healing.  You have cuts or breaks in the skin.  You have an ingrown nail.  You notice redness on your legs or feet.  You feel burning or tingling in your legs or feet.  You have pain or cramps in your legs and feet.  Your legs or feet are numb.  Your feet always feel cold. SEEK IMMEDIATE MEDICAL CARE IF:   There is increasing redness,   swelling, or pain in or around a wound.  There is a red line that goes up your leg.  Pus is coming from a wound.  You develop a fever or as directed by your health care provider.  You notice a bad smell coming from an ulcer or wound.   This information is not intended to replace advice given to you by your health care provider. Make sure you discuss any questions you have with your health care provider.   Document Released: 10/29/2000 Document Revised: 07/04/2013 Document Reviewed: 04/10/2013 Elsevier Interactive Patient Education 2016 [employer]  

## 2016-01-22 NOTE — Progress Notes (Signed)
Patient ID: [name], male   DOB: [birth year], 60 y.o.   MRN: [id number]  Subjective: This patient presents today for scheduled visit complaining of thickened and elongated toenails which are uncomfortable walking wearing shoes and request toenail debridement.      Physical Exam  Orientated 3  Vascular: DP pulses 1/4 right and 2/4 left PT pulses 0/4 right and 1/4 left Capillary reflex immediate bilaterally  Neurological: Sensation to 10 g monofilament wire intact 2/5 bilaterally Vibratory sensation nonreactive bilaterally Ankle reflex equal and reactive bilaterally  Dermatological: Dry skin bilaterally The toenails are hypertrophic, discolored, incurvated, deformed, and tender direct palpation 6-10 No open skin lesions bilaterally  Musculoskeletal: There is no restriction ankle, subtalar, midtarsal joints bilaterally      Assessment & Plan:   Assessment: Diabetic peripheral neuropathy Diabetic peripheral arterial disease Symptomatic onychomycoses 6-10 Dry skin bilaterally  Plan: The toenails 10 are debrided mechanically and electronically without a bleeding Patient advised to use all-purpose skin lotion twice a day for dry skin  Reappoint 3 months

## 2016-02-12 ENCOUNTER — Telehealth: Payer: Self-pay | Admitting: Oncology

## 2016-02-12 ENCOUNTER — Encounter: Payer: Self-pay | Admitting: Oncology

## 2016-02-12 NOTE — Telephone Encounter (Signed)
Verified address and insurance, faxed referring provider [facility]), mailed new pt packet, scheduled intake

## 2016-02-16 ENCOUNTER — Telehealth: Payer: Self-pay | Admitting: Oncology

## 2016-02-16 NOTE — Telephone Encounter (Signed)
Pt's wife called regarding changing his appt due to a schedule conflict

## 2016-02-20 ENCOUNTER — Telehealth: Payer: Self-pay | Admitting: Oncology

## 2016-02-20 NOTE — Telephone Encounter (Signed)
Requested additional record from [facility] for upcoming appt.to be faxed

## 2016-02-25 ENCOUNTER — Ambulatory Visit: Payer: Self-pay | Admitting: Oncology

## 2016-02-26 ENCOUNTER — Encounter: Payer: Self-pay | Admitting: *Deleted

## 2016-03-04 ENCOUNTER — Ambulatory Visit: Payer: Self-pay | Admitting: Oncology

## 2016-03-18 ENCOUNTER — Ambulatory Visit: Payer: Self-pay | Admitting: Emergency Medicine

## 2016-03-30 ENCOUNTER — Ambulatory Visit (INDEPENDENT_AMBULATORY_CARE_PROVIDER_SITE_OTHER): Payer: BLUE CROSS/BLUE SHIELD | Admitting: Emergency Medicine

## 2016-03-30 ENCOUNTER — Encounter: Payer: Self-pay | Admitting: Emergency Medicine

## 2016-03-30 VITALS — BP 130/70 | HR 73 | Ht 73.0 in | Wt 347.0 lb

## 2016-03-30 DIAGNOSIS — G4730 Sleep apnea, unspecified: Secondary | ICD-10-CM | POA: Diagnosis not present

## 2016-03-30 NOTE — Progress Notes (Signed)
HPI:  60 yo former smoker (11 pk-yrs) with CKD, HTN, DM, MGUS. He was admitted to [facility] 2/20-26 and dx with RUL HCAP. He was treated with abx and improved. He is now newly on dialysis. CT scan chest done 2/20 showed a 82mm RUL apical nodule. He is referred for hosp f/u and to follow the R apical nodule. PPD negative  He denies any cough or dyspnea. No wheezing. Remains very active.   ROV 08/15/14 - follow up for hx tobacco, suspected OSA, 66mm RUL nodule. The nodule has fully resolved on repeat CT scan 07/09/14.  He is doing well, some occasional cough but no dyspnea of exertional intolerance. Needs his PSG, split night, end of this month.   ROV 03/13/15 -- follow up visit for OSA. We had followed a 56mm nodule that resolved on subsequent CT. Former smoker. He is wearing CPAP - knows that it is helping him. He takes it off after a few hours because his throat is getting so dry. He is using humidity, ? Setting. His DME is ? [employer]. He is on HD, wt is up about 15 lbs since our last visit. His exertional tolerance is good. He does admit to some daytime naps, especially when he hasn't worn his CPAP.   ROV 03/30/16 -- patient is a former smoker, hx HD, with a history of obstructive sleep apnea. This is an annual follow-up visit. He is having trouble wearing CPAP every night. Has to take it off after a couple of hours. Usually it is because he gets dry, even after adding humidity. Full face mask. He can tell a significant difference in daytime sleepiness and energy when he uses the mask. [name] Vitals:   03/30/16 1331  BP: 130/70  Pulse: 73  Height: 6\' 1"  (1.854 m)  Weight: 347 lb (157.398 kg)  SpO2: 97%   Gen: Pleasant, overwt, in no distress,  normal affect  ENT: No lesions,  mouth clear,  oropharynx clear, no postnasal drip  Neck: No JVD, no TMG, no carotid bruits  Lungs: No use of accessory muscles, clear without rales or rhonchi  Cardiovascular: RRR, heart sounds normal, no murmur or gallops,  no peripheral edema  Musculoskeletal: No deformities, no cyanosis or clubbing  Neuro: alert, non focal  Skin: Warm, no lesions or rashes   CT chest 10/1 --  COMPARISON: Chest CT 01/04/2014.  FINDINGS:  Mediastinum: Heart size is normal. There is no significant  pericardial fluid, thickening or pericardial calcification. Aberrant  right subclavian artery (normal anatomical variant) incidentally  noted. Right-sided internal jugular central venous catheter with tip  terminating in the right atrium. No pathologically enlarged  mediastinal or hilar lymph nodes. Please note that accurate  exclusion of hilar adenopathy is limited on noncontrast CT scans.  Esophagus is unremarkable in appearance.  Lungs/Pleura: Previously noted 6 mm nodule in the apex of the right  upper lobe has resolved. Likewise, patchy areas of  peribronchovascular interstitial thickening and micronodularity in  the right upper lobe has also resolved compared to the prior  examination, indicative of a bronchopneumonia on the prior study. No  new suspicious appearing pulmonary nodules or masses. No new areas  of airspace consolidation. No pleural effusions.  Upper Abdomen: Unremarkable.  Musculoskeletal: There are no aggressive appearing lytic or blastic  lesions noted in the visualized portions of the skeleton.  IMPRESSION:  1. Previously noted right upper lobe nodule has resolved. No  suspicious nodules are noted on today's examination.  2.  Resolution of previously noted right upper lobe bronchopneumonia.  3. Additional incidental findings, as above.    A/P:  Sleep apnea I asked him to check to see if he can increase the humidity on his device. He believes the mask fit is good and the dryness is the biggest problem. He will work on increasing the time that he uses his CPAP every night.  Try to increase the humidity on your CPAP machine Work hard on wearing the CPAP mask for as long as you can each  night Follow with Dr [name] in 12 months or sooner if you have any problems

## 2016-03-30 NOTE — Assessment & Plan Note (Signed)
I asked him to check to see if he can increase the humidity on his device. He believes the mask fit is good and the dryness is the biggest problem. He will work on increasing the time that he uses his CPAP every night.  Try to increase the humidity on your CPAP machine Work hard on wearing the CPAP mask for as long as you can each night Follow with Dr Lamonte Sakai in 12 months or sooner if you have any problems

## 2016-03-30 NOTE — Patient Instructions (Signed)
Try to increase the humidity on your CPAP machine Work hard on wearing the CPAP mask for as long as you can each night Follow with Dr Lamonte Sakai in 12 months or sooner if you have any problems

## 2016-04-20 ENCOUNTER — Ambulatory Visit: Payer: BLUE CROSS/BLUE SHIELD | Admitting: Podiatry

## 2016-05-11 ENCOUNTER — Ambulatory Visit (INDEPENDENT_AMBULATORY_CARE_PROVIDER_SITE_OTHER): Payer: BLUE CROSS/BLUE SHIELD | Admitting: Podiatry

## 2016-05-11 ENCOUNTER — Encounter: Payer: Self-pay | Admitting: Podiatry

## 2016-05-11 DIAGNOSIS — B351 Tinea unguium: Secondary | ICD-10-CM

## 2016-05-11 DIAGNOSIS — M79674 Pain in right toe(s): Secondary | ICD-10-CM | POA: Diagnosis not present

## 2016-05-11 DIAGNOSIS — M79675 Pain in left toe(s): Secondary | ICD-10-CM | POA: Diagnosis not present

## 2016-05-11 NOTE — Progress Notes (Signed)
Patient ID: [name], male   DOB: 12/06/1955, 60 y.o.   MRN: [id number]  Subjective: This patient presents today for scheduled visit complaining of thickened and elongated toenails which are uncomfortable walking wearing shoes and request toenail debridement.  Physical Exam  Orientated 3  Vascular: DP pulses 1/4 right and 2/4 left PT pulses 0/4 right and 1/4 left Capillary reflex immediate bilaterally  Neurological: Sensation to 10 g monofilament wire intact 2/5 bilaterally Vibratory sensation nonreactive bilaterally Ankle reflex equal and reactive bilaterally  Dermatological: Dry skin bilaterally The toenails are hypertrophic, discolored, incurvated, deformed, and tender direct palpation 6-10 No open skin lesions bilaterally  Musculoskeletal: There is no restriction ankle, subtalar, midtarsal joints bilaterally     Assessment & Plan:   Assessment: Diabetic peripheral neuropathy Diabetic peripheral arterial disease Symptomatic onychomycoses 6-10 Dry skin bilaterally  Plan: The toenails 10 are debrided mechanically and electronically without a bleeding Patient advised to use all-purpose skin lotion twice a day for dry skin  Reappoint 3 months

## 2016-05-11 NOTE — Patient Instructions (Signed)
Diabetes and Foot Care Diabetes may cause you to have problems because of poor blood supply (circulation) to your feet and legs. This may cause the skin on your feet to become thinner, break easier, and heal more slowly. Your skin may become dry, and the skin may peel and crack. You may also have nerve damage in your legs and feet causing decreased feeling in them. You may not notice minor injuries to your feet that could lead to infections or more serious problems. Taking care of your feet is one of the most important things you can do for yourself.  HOME CARE INSTRUCTIONS  Wear shoes at all times, even in the house. Do not go barefoot. Bare feet are easily injured.  Check your feet daily for blisters, cuts, and redness. If you cannot see the bottom of your feet, use a mirror or ask someone for help.  Wash your feet with warm water (do not use hot water) and mild soap. Then pat your feet and the areas between your toes until they are completely dry. Do not soak your feet as this can dry your skin.  Apply a moisturizing lotion or petroleum jelly (that does not contain alcohol and is unscented) to the skin on your feet and to dry, brittle toenails. Do not apply lotion between your toes.  Trim your toenails straight across. Do not dig under them or around the cuticle. File the edges of your nails with an emery board or nail file.  Do not cut corns or calluses or try to remove them with medicine.  Wear clean socks or stockings every day. Make sure they are not too tight. Do not wear knee-high stockings since they may decrease blood flow to your legs.  Wear shoes that fit properly and have enough cushioning. To break in new shoes, wear them for just a few hours a day. This prevents you from injuring your feet. Always look in your shoes before you put them on to be sure there are no objects inside.  Do not cross your legs. This may decrease the blood flow to your feet.  If you find a minor scrape,  cut, or break in the skin on your feet, keep it and the skin around it clean and dry. These areas may be cleansed with mild soap and water. Do not cleanse the area with peroxide, alcohol, or iodine.  When you remove an adhesive bandage, be sure not to damage the skin around it.  If you have a wound, look at it several times a day to make sure it is healing.  Do not use heating pads or hot water bottles. They may burn your skin. If you have lost feeling in your feet or legs, you may not know it is happening until it is too late.  Make sure your health care provider performs a complete foot exam at least annually or more often if you have foot problems. Report any cuts, sores, or bruises to your health care provider immediately. SEEK MEDICAL CARE IF:   You have an injury that is not healing.  You have cuts or breaks in the skin.  You have an ingrown nail.  You notice redness on your legs or feet.  You feel burning or tingling in your legs or feet.  You have pain or cramps in your legs and feet.  Your legs or feet are numb.  Your feet always feel cold. SEEK IMMEDIATE MEDICAL CARE IF:   There is increasing redness,   swelling, or pain in or around a wound.  There is a red line that goes up your leg.  Pus is coming from a wound.  You develop a fever or as directed by your health care provider.  You notice a bad smell coming from an ulcer or wound.   This information is not intended to replace advice given to you by your health care provider. Make sure you discuss any questions you have with your health care provider.   Document Released: 10/29/2000 Document Revised: 07/04/2013 Document Reviewed: 04/10/2013 Elsevier Interactive Patient Education 2016 [employer]  

## 2016-06-16 DIAGNOSIS — N186 End stage renal disease: Secondary | ICD-10-CM | POA: Diagnosis not present

## 2016-06-16 DIAGNOSIS — D509 Iron deficiency anemia, unspecified: Secondary | ICD-10-CM | POA: Diagnosis not present

## 2016-06-16 DIAGNOSIS — E119 Type 2 diabetes mellitus without complications: Secondary | ICD-10-CM | POA: Diagnosis not present

## 2016-06-16 DIAGNOSIS — N2581 Secondary hyperparathyroidism of renal origin: Secondary | ICD-10-CM | POA: Diagnosis not present

## 2016-06-18 DIAGNOSIS — N186 End stage renal disease: Secondary | ICD-10-CM | POA: Diagnosis not present

## 2016-06-18 DIAGNOSIS — E119 Type 2 diabetes mellitus without complications: Secondary | ICD-10-CM | POA: Diagnosis not present

## 2016-06-18 DIAGNOSIS — D509 Iron deficiency anemia, unspecified: Secondary | ICD-10-CM | POA: Diagnosis not present

## 2016-06-18 DIAGNOSIS — N2581 Secondary hyperparathyroidism of renal origin: Secondary | ICD-10-CM | POA: Diagnosis not present

## 2016-06-21 DIAGNOSIS — N186 End stage renal disease: Secondary | ICD-10-CM | POA: Diagnosis not present

## 2016-06-21 DIAGNOSIS — E119 Type 2 diabetes mellitus without complications: Secondary | ICD-10-CM | POA: Diagnosis not present

## 2016-06-21 DIAGNOSIS — N2581 Secondary hyperparathyroidism of renal origin: Secondary | ICD-10-CM | POA: Diagnosis not present

## 2016-06-21 DIAGNOSIS — D509 Iron deficiency anemia, unspecified: Secondary | ICD-10-CM | POA: Diagnosis not present

## 2016-06-23 DIAGNOSIS — N186 End stage renal disease: Secondary | ICD-10-CM | POA: Diagnosis not present

## 2016-06-23 DIAGNOSIS — E119 Type 2 diabetes mellitus without complications: Secondary | ICD-10-CM | POA: Diagnosis not present

## 2016-06-23 DIAGNOSIS — D509 Iron deficiency anemia, unspecified: Secondary | ICD-10-CM | POA: Diagnosis not present

## 2016-06-23 DIAGNOSIS — N2581 Secondary hyperparathyroidism of renal origin: Secondary | ICD-10-CM | POA: Diagnosis not present

## 2016-06-25 DIAGNOSIS — N2581 Secondary hyperparathyroidism of renal origin: Secondary | ICD-10-CM | POA: Diagnosis not present

## 2016-06-25 DIAGNOSIS — D509 Iron deficiency anemia, unspecified: Secondary | ICD-10-CM | POA: Diagnosis not present

## 2016-06-25 DIAGNOSIS — N186 End stage renal disease: Secondary | ICD-10-CM | POA: Diagnosis not present

## 2016-06-25 DIAGNOSIS — E119 Type 2 diabetes mellitus without complications: Secondary | ICD-10-CM | POA: Diagnosis not present

## 2016-06-28 DIAGNOSIS — N2581 Secondary hyperparathyroidism of renal origin: Secondary | ICD-10-CM | POA: Diagnosis not present

## 2016-06-28 DIAGNOSIS — E119 Type 2 diabetes mellitus without complications: Secondary | ICD-10-CM | POA: Diagnosis not present

## 2016-06-28 DIAGNOSIS — D509 Iron deficiency anemia, unspecified: Secondary | ICD-10-CM | POA: Diagnosis not present

## 2016-06-28 DIAGNOSIS — N186 End stage renal disease: Secondary | ICD-10-CM | POA: Diagnosis not present

## 2016-06-30 DIAGNOSIS — N2581 Secondary hyperparathyroidism of renal origin: Secondary | ICD-10-CM | POA: Diagnosis not present

## 2016-06-30 DIAGNOSIS — E119 Type 2 diabetes mellitus without complications: Secondary | ICD-10-CM | POA: Diagnosis not present

## 2016-06-30 DIAGNOSIS — D509 Iron deficiency anemia, unspecified: Secondary | ICD-10-CM | POA: Diagnosis not present

## 2016-06-30 DIAGNOSIS — N186 End stage renal disease: Secondary | ICD-10-CM | POA: Diagnosis not present

## 2016-07-02 DIAGNOSIS — D509 Iron deficiency anemia, unspecified: Secondary | ICD-10-CM | POA: Diagnosis not present

## 2016-07-02 DIAGNOSIS — E119 Type 2 diabetes mellitus without complications: Secondary | ICD-10-CM | POA: Diagnosis not present

## 2016-07-02 DIAGNOSIS — N186 End stage renal disease: Secondary | ICD-10-CM | POA: Diagnosis not present

## 2016-07-02 DIAGNOSIS — N2581 Secondary hyperparathyroidism of renal origin: Secondary | ICD-10-CM | POA: Diagnosis not present

## 2016-07-05 DIAGNOSIS — D509 Iron deficiency anemia, unspecified: Secondary | ICD-10-CM | POA: Diagnosis not present

## 2016-07-05 DIAGNOSIS — E119 Type 2 diabetes mellitus without complications: Secondary | ICD-10-CM | POA: Diagnosis not present

## 2016-07-05 DIAGNOSIS — N186 End stage renal disease: Secondary | ICD-10-CM | POA: Diagnosis not present

## 2016-07-05 DIAGNOSIS — N2581 Secondary hyperparathyroidism of renal origin: Secondary | ICD-10-CM | POA: Diagnosis not present

## 2016-07-07 DIAGNOSIS — E119 Type 2 diabetes mellitus without complications: Secondary | ICD-10-CM | POA: Diagnosis not present

## 2016-07-07 DIAGNOSIS — N186 End stage renal disease: Secondary | ICD-10-CM | POA: Diagnosis not present

## 2016-07-07 DIAGNOSIS — N2581 Secondary hyperparathyroidism of renal origin: Secondary | ICD-10-CM | POA: Diagnosis not present

## 2016-07-07 DIAGNOSIS — D509 Iron deficiency anemia, unspecified: Secondary | ICD-10-CM | POA: Diagnosis not present

## 2016-07-09 DIAGNOSIS — E119 Type 2 diabetes mellitus without complications: Secondary | ICD-10-CM | POA: Diagnosis not present

## 2016-07-09 DIAGNOSIS — N186 End stage renal disease: Secondary | ICD-10-CM | POA: Diagnosis not present

## 2016-07-09 DIAGNOSIS — N2581 Secondary hyperparathyroidism of renal origin: Secondary | ICD-10-CM | POA: Diagnosis not present

## 2016-07-09 DIAGNOSIS — D509 Iron deficiency anemia, unspecified: Secondary | ICD-10-CM | POA: Diagnosis not present

## 2016-07-12 DIAGNOSIS — E119 Type 2 diabetes mellitus without complications: Secondary | ICD-10-CM | POA: Diagnosis not present

## 2016-07-12 DIAGNOSIS — D509 Iron deficiency anemia, unspecified: Secondary | ICD-10-CM | POA: Diagnosis not present

## 2016-07-12 DIAGNOSIS — N2581 Secondary hyperparathyroidism of renal origin: Secondary | ICD-10-CM | POA: Diagnosis not present

## 2016-07-12 DIAGNOSIS — N186 End stage renal disease: Secondary | ICD-10-CM | POA: Diagnosis not present

## 2016-07-14 DIAGNOSIS — E119 Type 2 diabetes mellitus without complications: Secondary | ICD-10-CM | POA: Diagnosis not present

## 2016-07-14 DIAGNOSIS — N186 End stage renal disease: Secondary | ICD-10-CM | POA: Diagnosis not present

## 2016-07-14 DIAGNOSIS — D509 Iron deficiency anemia, unspecified: Secondary | ICD-10-CM | POA: Diagnosis not present

## 2016-07-14 DIAGNOSIS — N2581 Secondary hyperparathyroidism of renal origin: Secondary | ICD-10-CM | POA: Diagnosis not present

## 2016-07-15 DIAGNOSIS — N186 End stage renal disease: Secondary | ICD-10-CM | POA: Diagnosis not present

## 2016-07-15 DIAGNOSIS — E1129 Type 2 diabetes mellitus with other diabetic kidney complication: Secondary | ICD-10-CM | POA: Diagnosis not present

## 2016-07-15 DIAGNOSIS — Z992 Dependence on renal dialysis: Secondary | ICD-10-CM | POA: Diagnosis not present

## 2016-07-16 DIAGNOSIS — Z23 Encounter for immunization: Secondary | ICD-10-CM | POA: Diagnosis not present

## 2016-07-16 DIAGNOSIS — E119 Type 2 diabetes mellitus without complications: Secondary | ICD-10-CM | POA: Diagnosis not present

## 2016-07-16 DIAGNOSIS — N2581 Secondary hyperparathyroidism of renal origin: Secondary | ICD-10-CM | POA: Diagnosis not present

## 2016-07-16 DIAGNOSIS — D509 Iron deficiency anemia, unspecified: Secondary | ICD-10-CM | POA: Diagnosis not present

## 2016-07-16 DIAGNOSIS — N186 End stage renal disease: Secondary | ICD-10-CM | POA: Diagnosis not present

## 2016-07-19 DIAGNOSIS — Z23 Encounter for immunization: Secondary | ICD-10-CM | POA: Diagnosis not present

## 2016-07-19 DIAGNOSIS — N2581 Secondary hyperparathyroidism of renal origin: Secondary | ICD-10-CM | POA: Diagnosis not present

## 2016-07-19 DIAGNOSIS — D509 Iron deficiency anemia, unspecified: Secondary | ICD-10-CM | POA: Diagnosis not present

## 2016-07-19 DIAGNOSIS — E119 Type 2 diabetes mellitus without complications: Secondary | ICD-10-CM | POA: Diagnosis not present

## 2016-07-19 DIAGNOSIS — N186 End stage renal disease: Secondary | ICD-10-CM | POA: Diagnosis not present

## 2016-07-21 DIAGNOSIS — E119 Type 2 diabetes mellitus without complications: Secondary | ICD-10-CM | POA: Diagnosis not present

## 2016-07-21 DIAGNOSIS — N2581 Secondary hyperparathyroidism of renal origin: Secondary | ICD-10-CM | POA: Diagnosis not present

## 2016-07-21 DIAGNOSIS — N186 End stage renal disease: Secondary | ICD-10-CM | POA: Diagnosis not present

## 2016-07-21 DIAGNOSIS — Z23 Encounter for immunization: Secondary | ICD-10-CM | POA: Diagnosis not present

## 2016-07-21 DIAGNOSIS — D509 Iron deficiency anemia, unspecified: Secondary | ICD-10-CM | POA: Diagnosis not present

## 2016-07-23 DIAGNOSIS — Z23 Encounter for immunization: Secondary | ICD-10-CM | POA: Diagnosis not present

## 2016-07-23 DIAGNOSIS — N186 End stage renal disease: Secondary | ICD-10-CM | POA: Diagnosis not present

## 2016-07-23 DIAGNOSIS — D509 Iron deficiency anemia, unspecified: Secondary | ICD-10-CM | POA: Diagnosis not present

## 2016-07-23 DIAGNOSIS — N2581 Secondary hyperparathyroidism of renal origin: Secondary | ICD-10-CM | POA: Diagnosis not present

## 2016-07-23 DIAGNOSIS — E119 Type 2 diabetes mellitus without complications: Secondary | ICD-10-CM | POA: Diagnosis not present

## 2016-07-26 DIAGNOSIS — N2581 Secondary hyperparathyroidism of renal origin: Secondary | ICD-10-CM | POA: Diagnosis not present

## 2016-07-26 DIAGNOSIS — D509 Iron deficiency anemia, unspecified: Secondary | ICD-10-CM | POA: Diagnosis not present

## 2016-07-26 DIAGNOSIS — E119 Type 2 diabetes mellitus without complications: Secondary | ICD-10-CM | POA: Diagnosis not present

## 2016-07-26 DIAGNOSIS — Z23 Encounter for immunization: Secondary | ICD-10-CM | POA: Diagnosis not present

## 2016-07-26 DIAGNOSIS — N186 End stage renal disease: Secondary | ICD-10-CM | POA: Diagnosis not present

## 2016-07-28 DIAGNOSIS — N186 End stage renal disease: Secondary | ICD-10-CM | POA: Diagnosis not present

## 2016-07-28 DIAGNOSIS — N2581 Secondary hyperparathyroidism of renal origin: Secondary | ICD-10-CM | POA: Diagnosis not present

## 2016-07-28 DIAGNOSIS — Z23 Encounter for immunization: Secondary | ICD-10-CM | POA: Diagnosis not present

## 2016-07-28 DIAGNOSIS — E119 Type 2 diabetes mellitus without complications: Secondary | ICD-10-CM | POA: Diagnosis not present

## 2016-07-28 DIAGNOSIS — D509 Iron deficiency anemia, unspecified: Secondary | ICD-10-CM | POA: Diagnosis not present

## 2016-07-30 DIAGNOSIS — Z23 Encounter for immunization: Secondary | ICD-10-CM | POA: Diagnosis not present

## 2016-07-30 DIAGNOSIS — N186 End stage renal disease: Secondary | ICD-10-CM | POA: Diagnosis not present

## 2016-07-30 DIAGNOSIS — E119 Type 2 diabetes mellitus without complications: Secondary | ICD-10-CM | POA: Diagnosis not present

## 2016-07-30 DIAGNOSIS — N2581 Secondary hyperparathyroidism of renal origin: Secondary | ICD-10-CM | POA: Diagnosis not present

## 2016-07-30 DIAGNOSIS — D509 Iron deficiency anemia, unspecified: Secondary | ICD-10-CM | POA: Diagnosis not present

## 2016-08-02 DIAGNOSIS — D509 Iron deficiency anemia, unspecified: Secondary | ICD-10-CM | POA: Diagnosis not present

## 2016-08-02 DIAGNOSIS — Z23 Encounter for immunization: Secondary | ICD-10-CM | POA: Diagnosis not present

## 2016-08-02 DIAGNOSIS — N2581 Secondary hyperparathyroidism of renal origin: Secondary | ICD-10-CM | POA: Diagnosis not present

## 2016-08-02 DIAGNOSIS — E119 Type 2 diabetes mellitus without complications: Secondary | ICD-10-CM | POA: Diagnosis not present

## 2016-08-02 DIAGNOSIS — N186 End stage renal disease: Secondary | ICD-10-CM | POA: Diagnosis not present

## 2016-08-04 DIAGNOSIS — D509 Iron deficiency anemia, unspecified: Secondary | ICD-10-CM | POA: Diagnosis not present

## 2016-08-04 DIAGNOSIS — E119 Type 2 diabetes mellitus without complications: Secondary | ICD-10-CM | POA: Diagnosis not present

## 2016-08-04 DIAGNOSIS — Z23 Encounter for immunization: Secondary | ICD-10-CM | POA: Diagnosis not present

## 2016-08-04 DIAGNOSIS — N2581 Secondary hyperparathyroidism of renal origin: Secondary | ICD-10-CM | POA: Diagnosis not present

## 2016-08-04 DIAGNOSIS — N186 End stage renal disease: Secondary | ICD-10-CM | POA: Diagnosis not present

## 2016-08-06 DIAGNOSIS — Z23 Encounter for immunization: Secondary | ICD-10-CM | POA: Diagnosis not present

## 2016-08-06 DIAGNOSIS — E119 Type 2 diabetes mellitus without complications: Secondary | ICD-10-CM | POA: Diagnosis not present

## 2016-08-06 DIAGNOSIS — N186 End stage renal disease: Secondary | ICD-10-CM | POA: Diagnosis not present

## 2016-08-06 DIAGNOSIS — N2581 Secondary hyperparathyroidism of renal origin: Secondary | ICD-10-CM | POA: Diagnosis not present

## 2016-08-06 DIAGNOSIS — D509 Iron deficiency anemia, unspecified: Secondary | ICD-10-CM | POA: Diagnosis not present

## 2016-08-09 DIAGNOSIS — N2581 Secondary hyperparathyroidism of renal origin: Secondary | ICD-10-CM | POA: Diagnosis not present

## 2016-08-09 DIAGNOSIS — D509 Iron deficiency anemia, unspecified: Secondary | ICD-10-CM | POA: Diagnosis not present

## 2016-08-09 DIAGNOSIS — N186 End stage renal disease: Secondary | ICD-10-CM | POA: Diagnosis not present

## 2016-08-09 DIAGNOSIS — Z23 Encounter for immunization: Secondary | ICD-10-CM | POA: Diagnosis not present

## 2016-08-09 DIAGNOSIS — E119 Type 2 diabetes mellitus without complications: Secondary | ICD-10-CM | POA: Diagnosis not present

## 2016-08-10 ENCOUNTER — Ambulatory Visit: Payer: BLUE CROSS/BLUE SHIELD | Admitting: Podiatry

## 2016-08-11 DIAGNOSIS — Z23 Encounter for immunization: Secondary | ICD-10-CM | POA: Diagnosis not present

## 2016-08-11 DIAGNOSIS — N2581 Secondary hyperparathyroidism of renal origin: Secondary | ICD-10-CM | POA: Diagnosis not present

## 2016-08-11 DIAGNOSIS — E119 Type 2 diabetes mellitus without complications: Secondary | ICD-10-CM | POA: Diagnosis not present

## 2016-08-11 DIAGNOSIS — N186 End stage renal disease: Secondary | ICD-10-CM | POA: Diagnosis not present

## 2016-08-11 DIAGNOSIS — D509 Iron deficiency anemia, unspecified: Secondary | ICD-10-CM | POA: Diagnosis not present

## 2016-08-13 DIAGNOSIS — N2581 Secondary hyperparathyroidism of renal origin: Secondary | ICD-10-CM | POA: Diagnosis not present

## 2016-08-13 DIAGNOSIS — E119 Type 2 diabetes mellitus without complications: Secondary | ICD-10-CM | POA: Diagnosis not present

## 2016-08-13 DIAGNOSIS — Z23 Encounter for immunization: Secondary | ICD-10-CM | POA: Diagnosis not present

## 2016-08-13 DIAGNOSIS — D509 Iron deficiency anemia, unspecified: Secondary | ICD-10-CM | POA: Diagnosis not present

## 2016-08-13 DIAGNOSIS — N186 End stage renal disease: Secondary | ICD-10-CM | POA: Diagnosis not present

## 2016-08-14 DIAGNOSIS — Z992 Dependence on renal dialysis: Secondary | ICD-10-CM | POA: Diagnosis not present

## 2016-08-14 DIAGNOSIS — N186 End stage renal disease: Secondary | ICD-10-CM | POA: Diagnosis not present

## 2016-08-14 DIAGNOSIS — E1129 Type 2 diabetes mellitus with other diabetic kidney complication: Secondary | ICD-10-CM | POA: Diagnosis not present

## 2016-08-16 DIAGNOSIS — N2581 Secondary hyperparathyroidism of renal origin: Secondary | ICD-10-CM | POA: Diagnosis not present

## 2016-08-16 DIAGNOSIS — N186 End stage renal disease: Secondary | ICD-10-CM | POA: Diagnosis not present

## 2016-08-17 DIAGNOSIS — I509 Heart failure, unspecified: Secondary | ICD-10-CM | POA: Diagnosis not present

## 2016-08-17 DIAGNOSIS — G4730 Sleep apnea, unspecified: Secondary | ICD-10-CM | POA: Diagnosis not present

## 2016-08-17 DIAGNOSIS — D472 Monoclonal gammopathy: Secondary | ICD-10-CM | POA: Diagnosis not present

## 2016-08-17 DIAGNOSIS — E782 Mixed hyperlipidemia: Secondary | ICD-10-CM | POA: Diagnosis not present

## 2016-08-17 DIAGNOSIS — B351 Tinea unguium: Secondary | ICD-10-CM | POA: Diagnosis not present

## 2016-08-17 DIAGNOSIS — E0842 Diabetes mellitus due to underlying condition with diabetic polyneuropathy: Secondary | ICD-10-CM | POA: Diagnosis not present

## 2016-08-17 DIAGNOSIS — E1122 Type 2 diabetes mellitus with diabetic chronic kidney disease: Secondary | ICD-10-CM | POA: Diagnosis not present

## 2016-08-17 DIAGNOSIS — N186 End stage renal disease: Secondary | ICD-10-CM | POA: Diagnosis not present

## 2016-08-17 DIAGNOSIS — R600 Localized edema: Secondary | ICD-10-CM | POA: Diagnosis not present

## 2016-08-17 DIAGNOSIS — R911 Solitary pulmonary nodule: Secondary | ICD-10-CM | POA: Diagnosis not present

## 2016-08-17 DIAGNOSIS — I10 Essential (primary) hypertension: Secondary | ICD-10-CM | POA: Diagnosis not present

## 2016-08-17 DIAGNOSIS — M109 Gout, unspecified: Secondary | ICD-10-CM | POA: Diagnosis not present

## 2016-08-18 DIAGNOSIS — N186 End stage renal disease: Secondary | ICD-10-CM | POA: Diagnosis not present

## 2016-08-18 DIAGNOSIS — N2581 Secondary hyperparathyroidism of renal origin: Secondary | ICD-10-CM | POA: Diagnosis not present

## 2016-08-20 DIAGNOSIS — N186 End stage renal disease: Secondary | ICD-10-CM | POA: Diagnosis not present

## 2016-08-20 DIAGNOSIS — N2581 Secondary hyperparathyroidism of renal origin: Secondary | ICD-10-CM | POA: Diagnosis not present

## 2016-08-23 DIAGNOSIS — N186 End stage renal disease: Secondary | ICD-10-CM | POA: Diagnosis not present

## 2016-08-23 DIAGNOSIS — N2581 Secondary hyperparathyroidism of renal origin: Secondary | ICD-10-CM | POA: Diagnosis not present

## 2016-08-25 ENCOUNTER — Encounter: Payer: Self-pay | Admitting: Podiatry

## 2016-08-25 ENCOUNTER — Ambulatory Visit (INDEPENDENT_AMBULATORY_CARE_PROVIDER_SITE_OTHER): Payer: Medicare Other | Admitting: Podiatry

## 2016-08-25 ENCOUNTER — Ambulatory Visit (INDEPENDENT_AMBULATORY_CARE_PROVIDER_SITE_OTHER): Payer: Medicare Other

## 2016-08-25 VITALS — BP 90/66 | HR 88 | Temp 97.6°F | Resp 18

## 2016-08-25 DIAGNOSIS — B351 Tinea unguium: Secondary | ICD-10-CM

## 2016-08-25 DIAGNOSIS — M79674 Pain in right toe(s): Secondary | ICD-10-CM

## 2016-08-25 DIAGNOSIS — R52 Pain, unspecified: Secondary | ICD-10-CM | POA: Diagnosis not present

## 2016-08-25 DIAGNOSIS — N186 End stage renal disease: Secondary | ICD-10-CM | POA: Diagnosis not present

## 2016-08-25 DIAGNOSIS — M79675 Pain in left toe(s): Secondary | ICD-10-CM

## 2016-08-25 DIAGNOSIS — R609 Edema, unspecified: Secondary | ICD-10-CM

## 2016-08-25 DIAGNOSIS — N2581 Secondary hyperparathyroidism of renal origin: Secondary | ICD-10-CM | POA: Diagnosis not present

## 2016-08-25 NOTE — Progress Notes (Signed)
   Subjective:    Patient ID: [name], male    DOB: 07-27-1956, 60 y.o.   MRN: [id number]  HPI     This patient presents today stating that he is ongoing swelling in his left lower leg for approximately one month. The swelling does tends to reduce slightly in the a.m. increase in the p.m. Patient states that his primary care physician ordered a circulation test/ultrasound and he was told that he had no clots in the vein and he did not need any further follow-up according to the patient. The exam was done at [facility] according to the patient and the results are not visible online today Patient still has some concern about this swelling and would like to have further evaluation. This patient was scheduled for debridement of his toenails which she said are on cough walking wearing shoes. This patient is a diabetic with a history of neuropathy and peripheral arterial disease       \Review of Systems  Eyes:       Blurry  All other systems reviewed and are negative.      Objective:   Physical Exam   BP of 90/66 Pulse 88 Respiration 18 Temperature 97.6 F  Orientated 3  Vascular: Mild lower extremity edema with mild calf tenderness left Mild lower extremity edema right DP pulses 1/4 bilaterally PT pulses 1/4 bilaterally Capillary reflex within normal limits  Neurological: Sensation to 10 g monofilament wire intact 2/5 bilaterally Vibratory sensation nonreactive bilaterally Ankle reflexes equal and reactive bilaterally  Dermatological: No open skin lesions bilaterally The toenails are hypertrophic, elongated, discolored, deformed and tender to direct palpation 6-10  Musculoskeletal: There is no restriction ankle, subtalar, midtarsal joints bilaterally Manual motor testing dorsi flexion, plantar flexion, inversion, eversion 5/5 bilaterally  Xray examination weightbearing left foot dated 08/25/2016  Intact bony structures without fracture and/or dislocation Bone  density appears adequate Joint spaces appear adequate Small inferior calcaneal spur Occasional vascular calcifications noted Increased soft tissue density No gas noted  Radiographic impression: No acute bony abnormality noted in the weightbearing x-ray of the left foot dated 08/25/2016 Gean Birchwood DPM      Assessment & Plan:   Assessment: Peripheral arterial disease associated with decreased pulses Diabetic peripheral neuropathy associated with decreased sensation Edema left lower leg has been evaluated and a DVT has been ruled out, per patient Mycotic toenails 6-10  Plan: At this time because patient has had a vascular evaluation and a DVT has been ruled out per patient I'm referring patient to vascular and vein for further evaluation of this lower extremity edema. He was instructed that if you notice any significant change in the left lower leg swelling or any other symptoms present to the emergency department or contact the vascular surgeon's office  Toenails 6-10 are debrided mechanically and left without any bleeding  Reappoint 3 months

## 2016-08-25 NOTE — Patient Instructions (Signed)
Today for your scheduled visit for debridement of fungal toenails you gave a 1 month history of swelling in your left lower leg. Your primary care physician ordered a circulation test and according to your primary care physician the circulation test confirmed that there were no clots in your left leg in the past week from the date of this visit and he did not recommend any further follow-up Today I was able to feel your pulses in your feet, there are no open wounds. There is mild edema in the left lower leg and calf tenderness left Our office will contact vascular and vein for a vascular consult to evaluate the edema in your left leg. If this swelling becomes worse presents to emergency department if you're not able to see the vascular surgeon prior  Diabetes and Foot Care Diabetes may cause you to have problems because of poor blood supply (circulation) to your feet and legs. This may cause the skin on your feet to become thinner, break easier, and heal more slowly. Your skin may become dry, and the skin may peel and crack. You may also have nerve damage in your legs and feet causing decreased feeling in them. You may not notice minor injuries to your feet that could lead to infections or more serious problems. Taking care of your feet is one of the most important things you can do for yourself.  HOME CARE INSTRUCTIONS  Wear shoes at all times, even in the house. Do not go barefoot. Bare feet are easily injured.  Check your feet daily for blisters, cuts, and redness. If you cannot see the bottom of your feet, use a mirror or ask someone for help.  Wash your feet with warm water (do not use hot water) and mild soap. Then pat your feet and the areas between your toes until they are completely dry. Do not soak your feet as this can dry your skin.  Apply a moisturizing lotion or petroleum jelly (that does not contain alcohol and is unscented) to the skin on your feet and to dry, brittle toenails. Do not  apply lotion between your toes.  Trim your toenails straight across. Do not dig under them or around the cuticle. File the edges of your nails with an emery board or nail file.  Do not cut corns or calluses or try to remove them with medicine.  Wear clean socks or stockings every day. Make sure they are not too tight. Do not wear knee-high stockings since they may decrease blood flow to your legs.  Wear shoes that fit properly and have enough cushioning. To break in new shoes, wear them for just a few hours a day. This prevents you from injuring your feet. Always look in your shoes before you put them on to be sure there are no objects inside.  Do not cross your legs. This may decrease the blood flow to your feet.  If you find a minor scrape, cut, or break in the skin on your feet, keep it and the skin around it clean and dry. These areas may be cleansed with mild soap and water. Do not cleanse the area with peroxide, alcohol, or iodine.  When you remove an adhesive bandage, be sure not to damage the skin around it.  If you have a wound, look at it several times a day to make sure it is healing.  Do not use heating pads or hot water bottles. They may burn your skin. If you have lost  feeling in your feet or legs, you may not know it is happening until it is too late.  Make sure your health care provider performs a complete foot exam at least annually or more often if you have foot problems. Report any cuts, sores, or bruises to your health care provider immediately. SEEK MEDICAL CARE IF:   You have an injury that is not healing.  You have cuts or breaks in the skin.  You have an ingrown nail.  You notice redness on your legs or feet.  You feel burning or tingling in your legs or feet.  You have pain or cramps in your legs and feet.  Your legs or feet are numb.  Your feet always feel cold. SEEK IMMEDIATE MEDICAL CARE IF:   There is increasing redness, swelling, or pain in or  around a wound.  There is a red line that goes up your leg.  Pus is coming from a wound.  You develop a fever or as directed by your health care provider.  You notice a bad smell coming from an ulcer or wound.   This information is not intended to replace advice given to you by your health care provider. Make sure you discuss any questions you have with your health care provider.   Document Released: 10/29/2000 Document Revised: 07/04/2013 Document Reviewed: 04/10/2013 Elsevier Interactive Patient Education [employer].

## 2016-08-27 ENCOUNTER — Other Ambulatory Visit: Payer: Self-pay | Admitting: Vascular Surgery

## 2016-08-27 DIAGNOSIS — R609 Edema, unspecified: Secondary | ICD-10-CM

## 2016-08-27 DIAGNOSIS — N2581 Secondary hyperparathyroidism of renal origin: Secondary | ICD-10-CM | POA: Diagnosis not present

## 2016-08-27 DIAGNOSIS — N186 End stage renal disease: Secondary | ICD-10-CM | POA: Diagnosis not present

## 2016-08-30 DIAGNOSIS — N186 End stage renal disease: Secondary | ICD-10-CM | POA: Diagnosis not present

## 2016-08-30 DIAGNOSIS — N2581 Secondary hyperparathyroidism of renal origin: Secondary | ICD-10-CM | POA: Diagnosis not present

## 2016-09-01 DIAGNOSIS — N186 End stage renal disease: Secondary | ICD-10-CM | POA: Diagnosis not present

## 2016-09-01 DIAGNOSIS — N2581 Secondary hyperparathyroidism of renal origin: Secondary | ICD-10-CM | POA: Diagnosis not present

## 2016-09-03 DIAGNOSIS — N186 End stage renal disease: Secondary | ICD-10-CM | POA: Diagnosis not present

## 2016-09-03 DIAGNOSIS — N2581 Secondary hyperparathyroidism of renal origin: Secondary | ICD-10-CM | POA: Diagnosis not present

## 2016-09-06 DIAGNOSIS — N186 End stage renal disease: Secondary | ICD-10-CM | POA: Diagnosis not present

## 2016-09-06 DIAGNOSIS — N2581 Secondary hyperparathyroidism of renal origin: Secondary | ICD-10-CM | POA: Diagnosis not present

## 2016-09-08 DIAGNOSIS — N186 End stage renal disease: Secondary | ICD-10-CM | POA: Diagnosis not present

## 2016-09-08 DIAGNOSIS — E119 Type 2 diabetes mellitus without complications: Secondary | ICD-10-CM | POA: Diagnosis not present

## 2016-09-08 DIAGNOSIS — N2581 Secondary hyperparathyroidism of renal origin: Secondary | ICD-10-CM | POA: Diagnosis not present

## 2016-09-10 DIAGNOSIS — N186 End stage renal disease: Secondary | ICD-10-CM | POA: Diagnosis not present

## 2016-09-10 DIAGNOSIS — N2581 Secondary hyperparathyroidism of renal origin: Secondary | ICD-10-CM | POA: Diagnosis not present

## 2016-09-13 DIAGNOSIS — N186 End stage renal disease: Secondary | ICD-10-CM | POA: Diagnosis not present

## 2016-09-13 DIAGNOSIS — N2581 Secondary hyperparathyroidism of renal origin: Secondary | ICD-10-CM | POA: Diagnosis not present

## 2016-09-14 DIAGNOSIS — E1129 Type 2 diabetes mellitus with other diabetic kidney complication: Secondary | ICD-10-CM | POA: Diagnosis not present

## 2016-09-14 DIAGNOSIS — Z992 Dependence on renal dialysis: Secondary | ICD-10-CM | POA: Diagnosis not present

## 2016-09-14 DIAGNOSIS — N186 End stage renal disease: Secondary | ICD-10-CM | POA: Diagnosis not present

## 2016-09-15 DIAGNOSIS — N186 End stage renal disease: Secondary | ICD-10-CM | POA: Diagnosis not present

## 2016-09-15 DIAGNOSIS — N2581 Secondary hyperparathyroidism of renal origin: Secondary | ICD-10-CM | POA: Diagnosis not present

## 2016-09-17 DIAGNOSIS — N2581 Secondary hyperparathyroidism of renal origin: Secondary | ICD-10-CM | POA: Diagnosis not present

## 2016-09-17 DIAGNOSIS — N186 End stage renal disease: Secondary | ICD-10-CM | POA: Diagnosis not present

## 2016-09-20 DIAGNOSIS — N2581 Secondary hyperparathyroidism of renal origin: Secondary | ICD-10-CM | POA: Diagnosis not present

## 2016-09-20 DIAGNOSIS — N186 End stage renal disease: Secondary | ICD-10-CM | POA: Diagnosis not present

## 2016-09-22 DIAGNOSIS — N2581 Secondary hyperparathyroidism of renal origin: Secondary | ICD-10-CM | POA: Diagnosis not present

## 2016-09-22 DIAGNOSIS — N186 End stage renal disease: Secondary | ICD-10-CM | POA: Diagnosis not present

## 2016-09-24 DIAGNOSIS — N2581 Secondary hyperparathyroidism of renal origin: Secondary | ICD-10-CM | POA: Diagnosis not present

## 2016-09-24 DIAGNOSIS — N186 End stage renal disease: Secondary | ICD-10-CM | POA: Diagnosis not present

## 2016-09-27 DIAGNOSIS — N2581 Secondary hyperparathyroidism of renal origin: Secondary | ICD-10-CM | POA: Diagnosis not present

## 2016-09-27 DIAGNOSIS — N186 End stage renal disease: Secondary | ICD-10-CM | POA: Diagnosis not present

## 2016-09-29 ENCOUNTER — Encounter: Payer: Self-pay | Admitting: Vascular Surgery

## 2016-09-29 DIAGNOSIS — N186 End stage renal disease: Secondary | ICD-10-CM | POA: Diagnosis not present

## 2016-09-29 DIAGNOSIS — N2581 Secondary hyperparathyroidism of renal origin: Secondary | ICD-10-CM | POA: Diagnosis not present

## 2016-10-01 DIAGNOSIS — N186 End stage renal disease: Secondary | ICD-10-CM | POA: Diagnosis not present

## 2016-10-01 DIAGNOSIS — N2581 Secondary hyperparathyroidism of renal origin: Secondary | ICD-10-CM | POA: Diagnosis not present

## 2016-10-03 DIAGNOSIS — N186 End stage renal disease: Secondary | ICD-10-CM | POA: Diagnosis not present

## 2016-10-03 DIAGNOSIS — N2581 Secondary hyperparathyroidism of renal origin: Secondary | ICD-10-CM | POA: Diagnosis not present

## 2016-10-05 DIAGNOSIS — N2581 Secondary hyperparathyroidism of renal origin: Secondary | ICD-10-CM | POA: Diagnosis not present

## 2016-10-05 DIAGNOSIS — N186 End stage renal disease: Secondary | ICD-10-CM | POA: Diagnosis not present

## 2016-10-08 DIAGNOSIS — N186 End stage renal disease: Secondary | ICD-10-CM | POA: Diagnosis not present

## 2016-10-08 DIAGNOSIS — N2581 Secondary hyperparathyroidism of renal origin: Secondary | ICD-10-CM | POA: Diagnosis not present

## 2016-10-11 DIAGNOSIS — N186 End stage renal disease: Secondary | ICD-10-CM | POA: Diagnosis not present

## 2016-10-11 DIAGNOSIS — N2581 Secondary hyperparathyroidism of renal origin: Secondary | ICD-10-CM | POA: Diagnosis not present

## 2016-10-12 ENCOUNTER — Encounter: Payer: Self-pay | Admitting: Vascular Surgery

## 2016-10-12 ENCOUNTER — Ambulatory Visit (HOSPITAL_COMMUNITY)
Admission: RE | Admit: 2016-10-12 | Discharge: 2016-10-12 | Disposition: A | Discharge status: Home / Self Care | Payer: Medicare Other | Source: Ambulatory Visit | Attending: Vascular Surgery | Admitting: Vascular Surgery

## 2016-10-12 ENCOUNTER — Ambulatory Visit (INDEPENDENT_AMBULATORY_CARE_PROVIDER_SITE_OTHER): Payer: Medicare Other | Admitting: Vascular Surgery

## 2016-10-12 VITALS — BP 133/80 | HR 75 | Temp 98.4°F | Resp 20 | Ht 73.0 in | Wt 349.0 lb

## 2016-10-12 DIAGNOSIS — E1165 Type 2 diabetes mellitus with hyperglycemia: Secondary | ICD-10-CM | POA: Diagnosis not present

## 2016-10-12 DIAGNOSIS — I83893 Varicose veins of bilateral lower extremities with other complications: Secondary | ICD-10-CM | POA: Insufficient documentation

## 2016-10-12 DIAGNOSIS — I10 Essential (primary) hypertension: Secondary | ICD-10-CM | POA: Diagnosis not present

## 2016-10-12 DIAGNOSIS — E7800 Pure hypercholesterolemia, unspecified: Secondary | ICD-10-CM | POA: Diagnosis not present

## 2016-10-12 DIAGNOSIS — R2243 Localized swelling, mass and lump, lower limb, bilateral: Secondary | ICD-10-CM | POA: Diagnosis not present

## 2016-10-12 DIAGNOSIS — R609 Edema, unspecified: Secondary | ICD-10-CM | POA: Diagnosis not present

## 2016-10-12 DIAGNOSIS — N186 End stage renal disease: Secondary | ICD-10-CM | POA: Diagnosis not present

## 2016-10-12 NOTE — Progress Notes (Signed)
Vascular and Vein Specialist of [facility]  Patient name: [name] MRN: [id number] DOB: 1956/06/22 Sex: male  REASON FOR VISIT: Leg swelling  HPI: [name] is a 60 y.o. male, who presents for evaluation of bilateral leg swelling and pain. He states that his swelling is worse if he is been on his feet all day. He is known to our practice from having undergone right radiocephalic fistula in 9528 by Dr. Scot Dock. He is currently on hemodialysis. He denies any issues with this fistula. He has no history of DVT.  His past medical history of diabetes, hyperlipidemia, hypertension and congestive heart disease. Former smoker.  Past Medical History:  Diagnosis Date  . Alcohol abuse   . Congestive heart disease ([place]) 12/2011  . Diabetes mellitus   . Gout   . Hyperlipidemia   . Hyperlipidemia   . Hypertension   . Monoclonal gammopathies 02/25/2012  . Pedal edema   . Renal insufficiency   . Tobacco abuse     Family History  Problem Relation Age of Onset  . Lung cancer Father   . Diabetes Mother   . Heart disease Mother     SOCIAL HISTORY: Social History   Social History  . Marital status: Married    Spouse name: [name]  . Number of children: 2  . Years of education: 12   Occupational History  . Security    Social History Main Topics  . Smoking status: Former Smoker    Packs/day: 0.30    Years: 33.00    Types: Cigarettes    Quit date: 07/16/2010  . Smokeless tobacco: Never Used  . Alcohol use 21.0 oz/week    42 drink(s) per week  . Drug use: No  . Sexual activity: No   Other Topics Concern  . Not on file   Social History Narrative   Married, lives in [place] with wife.    Allergies  Allergen Reactions  . Penicillins Nausea And Vomiting and Other (See Comments)    Stomach pains also  . Vioxx [Rofecoxib] Nausea Only and Other (See Comments)    Gi upset.    Current Outpatient Prescriptions  Medication Sig Dispense Refill  . allopurinol (ZYLOPRIM) 100 MG  tablet Take 100 mg by mouth 2 (two) times daily as needed.     Marland Kitchen amLODipine (NORVASC) 10 MG tablet Take 10 mg by mouth at bedtime.    Marland Kitchen amLODipine (NORVASC) 5 MG tablet Take 5 mg by mouth.   5  . aspirin EC 81 MG tablet Take 81 mg by mouth daily.    . calcium acetate, Phos Binder, (PHOSLYRA) 667 MG/5ML SOLN Take 667 mg by mouth 3 (three) times daily with meals. Takes 4 capsules with each meal (3 times daily).    . carvedilol (COREG) 6.25 MG tablet Take 1 tablet (6.25 mg total) by mouth 2 (two) times daily with a meal. 60 tablet 0  . cinacalcet (SENSIPAR) 30 MG tablet Take 30 mg by mouth daily.    Marland Kitchen COLCRYS 0.6 MG tablet TK 1 C PO QD PRF GOUT  5  . furosemide (LASIX) 40 MG tablet TK 1 T PO QD  5  . gabapentin (NEURONTIN) 100 MG capsule TK 1 TO 3 CS PO ONCE D FOR ITCHING  12  . indomethacin (INDOCIN) 50 MG capsule Take 50 mg by mouth 2 (two) times daily as needed (gout flair up).     Ernest Mallick FLEXTOUCH 100 UNIT/ML Pen INJ 30 UNITS [place] HS  6  . loratadine (CLARITIN) 10 MG tablet TAKE 1 TABLET EVERY DAY 30 tablet 1  . NOVOLOG FLEXPEN 100 UNIT/ML FlexPen 15 Units. Injects 15 units before each meal (3 times daily),  6  . pravastatin (PRAVACHOL) 40 MG tablet Take 40 mg by mouth daily.    . insulin lispro (HUMALOG) 100 UNIT/ML cartridge Inject into the skin 3 (three) times daily with meals.    . Insulin Lispro Prot & Lispro (HUMALOG MIX 75/25 KWIKPEN [place]) Inject 30 Units into the skin at bedtime.     . insulin NPH Human (HUMULIN N,NOVOLIN N) 100 UNIT/ML injection Inject into the skin at bedtime.    Marland Kitchen lanthanum (FOSRENOL) 1000 MG chewable tablet Chew 1,000 mg by mouth 2 (two) times daily with a meal.    . latanoprost (XALATAN) 0.005 % ophthalmic solution Place 1 drop into both eyes at bedtime.  12  . polyvinyl alcohol-povidone (REFRESH) 1.4-0.6 % ophthalmic solution 1-2 drops as needed.     No current facility-administered medications for this visit.     REVIEW OF SYSTEMS:  [X]  denotes positive  finding, [ ]  denotes negative finding Cardiac  Comments:  Chest pain or chest pressure:    Shortness of breath upon exertion: x   Short of breath when lying flat:    Irregular heart rhythm:        Vascular    Pain in calf, thigh, or hip brought on by ambulation: x   Pain in feet at night that wakes you up from your sleep:  x   Blood clot in your veins:    Leg swelling:  x       Pulmonary    Oxygen at home:    Productive cough:     Wheezing:         Neurologic    Sudden weakness in arms or legs:     Sudden numbness in arms or legs:     Sudden onset of difficulty speaking or slurred speech:    Temporary loss of vision in one eye:     Problems with dizziness:         Gastrointestinal    Blood in stool:     Vomited blood:         Genitourinary    Burning when urinating:     Blood in urine:        Psychiatric    Major depression:         Hematologic    Bleeding problems:    Problems with blood clotting too easily:        Skin    Rashes or ulcers:        Constitutional    Fever or chills:      PHYSICAL EXAM: Vitals:   10/12/16 1343  BP: 133/80  Pulse: 75  Resp: 20  Temp: 98.4 F (36.9 C)  TempSrc: Oral  SpO2: 95%  Weight: (!) 349 lb (158.3 kg)  Height: 6\' 1"  (1.854 m)    GENERAL: The patient is a well-nourished male, in no acute distress. The vital signs are documented above. VASCULAR: 2+ DP pulses bilaterally. Bilateral leg swelling. Right forearm AV fistula. PULMONARY: Nonlabored respiratory effort. MUSCULOSKELETAL: There are no major deformities or cyanosis. NEUROLOGIC: No focal weakness or paresthesias are detected. SKIN: There are no ulcers or rashes noted. PSYCHIATRIC: The patient has a normal affect.  DATA:  Lower extremity venous reflux evaluation 10/04/2016  There is no evidence of DVT. Right common femoral reflux present.  There is left popliteal venous reflux present. The great saphenous veins are competent bilaterally.   MEDICAL  ISSUES: Bilateral lower extremity swelling  The patient swelling is not related to chronic venous insufficiency. His venous reflux evaluation was essentially normal. Clinically, this does not look like lymphedema. His swelling may be related to excess fluid retention. Advised him to talk to his nephrologist regarding this. Discussed elevation of the legs and compression stockings as supportive care. Assured the patient that he is not at risk for any limb loss. He has normal arterial function on exam. He'll follow up on an as-needed basis.  Virgina Jock, PA-C [facility] [place]   I have examined the patient, reviewed and agree with above. Reassured the patient and his wife that this is not limb threatening. Normal arterial exam. Does not have any duplex evidence of any significant venous reflux or obstruction. Suspect this is related to volume overload. Explain the importance of elevation and potential use of compression. Due to his morbid obesity this may be quite difficult for him to tolerate. Will see [place] again on an as-needed basis  Curt Jews, MD 10/12/2016 2:11 PM

## 2016-10-13 DIAGNOSIS — N186 End stage renal disease: Secondary | ICD-10-CM | POA: Diagnosis not present

## 2016-10-13 DIAGNOSIS — N2581 Secondary hyperparathyroidism of renal origin: Secondary | ICD-10-CM | POA: Diagnosis not present

## 2016-10-14 DIAGNOSIS — Z992 Dependence on renal dialysis: Secondary | ICD-10-CM | POA: Diagnosis not present

## 2016-10-14 DIAGNOSIS — E1129 Type 2 diabetes mellitus with other diabetic kidney complication: Secondary | ICD-10-CM | POA: Diagnosis not present

## 2016-10-14 DIAGNOSIS — N186 End stage renal disease: Secondary | ICD-10-CM | POA: Diagnosis not present

## 2016-10-15 DIAGNOSIS — N186 End stage renal disease: Secondary | ICD-10-CM | POA: Diagnosis not present

## 2016-10-15 DIAGNOSIS — E119 Type 2 diabetes mellitus without complications: Secondary | ICD-10-CM | POA: Diagnosis not present

## 2016-10-15 DIAGNOSIS — N2581 Secondary hyperparathyroidism of renal origin: Secondary | ICD-10-CM | POA: Diagnosis not present

## 2016-10-18 DIAGNOSIS — N186 End stage renal disease: Secondary | ICD-10-CM | POA: Diagnosis not present

## 2016-10-18 DIAGNOSIS — E119 Type 2 diabetes mellitus without complications: Secondary | ICD-10-CM | POA: Diagnosis not present

## 2016-10-18 DIAGNOSIS — N2581 Secondary hyperparathyroidism of renal origin: Secondary | ICD-10-CM | POA: Diagnosis not present

## 2016-10-19 DIAGNOSIS — E113293 Type 2 diabetes mellitus with mild nonproliferative diabetic retinopathy without macular edema, bilateral: Secondary | ICD-10-CM | POA: Diagnosis not present

## 2016-10-19 DIAGNOSIS — H40013 Open angle with borderline findings, low risk, bilateral: Secondary | ICD-10-CM | POA: Diagnosis not present

## 2016-10-19 DIAGNOSIS — H25813 Combined forms of age-related cataract, bilateral: Secondary | ICD-10-CM | POA: Diagnosis not present

## 2016-10-19 DIAGNOSIS — E1165 Type 2 diabetes mellitus with hyperglycemia: Secondary | ICD-10-CM | POA: Diagnosis not present

## 2016-10-20 DIAGNOSIS — N186 End stage renal disease: Secondary | ICD-10-CM | POA: Diagnosis not present

## 2016-10-20 DIAGNOSIS — E119 Type 2 diabetes mellitus without complications: Secondary | ICD-10-CM | POA: Diagnosis not present

## 2016-10-20 DIAGNOSIS — N2581 Secondary hyperparathyroidism of renal origin: Secondary | ICD-10-CM | POA: Diagnosis not present

## 2016-10-22 DIAGNOSIS — E119 Type 2 diabetes mellitus without complications: Secondary | ICD-10-CM | POA: Diagnosis not present

## 2016-10-22 DIAGNOSIS — N2581 Secondary hyperparathyroidism of renal origin: Secondary | ICD-10-CM | POA: Diagnosis not present

## 2016-10-22 DIAGNOSIS — N186 End stage renal disease: Secondary | ICD-10-CM | POA: Diagnosis not present

## 2016-10-25 DIAGNOSIS — N186 End stage renal disease: Secondary | ICD-10-CM | POA: Diagnosis not present

## 2016-10-25 DIAGNOSIS — N2581 Secondary hyperparathyroidism of renal origin: Secondary | ICD-10-CM | POA: Diagnosis not present

## 2016-10-25 DIAGNOSIS — E119 Type 2 diabetes mellitus without complications: Secondary | ICD-10-CM | POA: Diagnosis not present

## 2016-10-27 DIAGNOSIS — N186 End stage renal disease: Secondary | ICD-10-CM | POA: Diagnosis not present

## 2016-10-27 DIAGNOSIS — E119 Type 2 diabetes mellitus without complications: Secondary | ICD-10-CM | POA: Diagnosis not present

## 2016-10-27 DIAGNOSIS — N2581 Secondary hyperparathyroidism of renal origin: Secondary | ICD-10-CM | POA: Diagnosis not present

## 2016-10-29 DIAGNOSIS — N2581 Secondary hyperparathyroidism of renal origin: Secondary | ICD-10-CM | POA: Diagnosis not present

## 2016-10-29 DIAGNOSIS — E119 Type 2 diabetes mellitus without complications: Secondary | ICD-10-CM | POA: Diagnosis not present

## 2016-10-29 DIAGNOSIS — N186 End stage renal disease: Secondary | ICD-10-CM | POA: Diagnosis not present

## 2016-11-01 DIAGNOSIS — N186 End stage renal disease: Secondary | ICD-10-CM | POA: Diagnosis not present

## 2016-11-01 DIAGNOSIS — N2581 Secondary hyperparathyroidism of renal origin: Secondary | ICD-10-CM | POA: Diagnosis not present

## 2016-11-01 DIAGNOSIS — E119 Type 2 diabetes mellitus without complications: Secondary | ICD-10-CM | POA: Diagnosis not present

## 2016-11-03 DIAGNOSIS — N2581 Secondary hyperparathyroidism of renal origin: Secondary | ICD-10-CM | POA: Diagnosis not present

## 2016-11-03 DIAGNOSIS — N186 End stage renal disease: Secondary | ICD-10-CM | POA: Diagnosis not present

## 2016-11-03 DIAGNOSIS — E119 Type 2 diabetes mellitus without complications: Secondary | ICD-10-CM | POA: Diagnosis not present

## 2016-11-04 DIAGNOSIS — H25811 Combined forms of age-related cataract, right eye: Secondary | ICD-10-CM | POA: Diagnosis not present

## 2016-11-04 DIAGNOSIS — H2511 Age-related nuclear cataract, right eye: Secondary | ICD-10-CM | POA: Diagnosis not present

## 2016-11-05 DIAGNOSIS — N2581 Secondary hyperparathyroidism of renal origin: Secondary | ICD-10-CM | POA: Diagnosis not present

## 2016-11-05 DIAGNOSIS — N186 End stage renal disease: Secondary | ICD-10-CM | POA: Diagnosis not present

## 2016-11-05 DIAGNOSIS — E119 Type 2 diabetes mellitus without complications: Secondary | ICD-10-CM | POA: Diagnosis not present

## 2016-11-07 DIAGNOSIS — E119 Type 2 diabetes mellitus without complications: Secondary | ICD-10-CM | POA: Diagnosis not present

## 2016-11-07 DIAGNOSIS — N186 End stage renal disease: Secondary | ICD-10-CM | POA: Diagnosis not present

## 2016-11-07 DIAGNOSIS — N2581 Secondary hyperparathyroidism of renal origin: Secondary | ICD-10-CM | POA: Diagnosis not present

## 2016-11-10 DIAGNOSIS — E119 Type 2 diabetes mellitus without complications: Secondary | ICD-10-CM | POA: Diagnosis not present

## 2016-11-10 DIAGNOSIS — N2581 Secondary hyperparathyroidism of renal origin: Secondary | ICD-10-CM | POA: Diagnosis not present

## 2016-11-10 DIAGNOSIS — N186 End stage renal disease: Secondary | ICD-10-CM | POA: Diagnosis not present

## 2016-11-12 DIAGNOSIS — E119 Type 2 diabetes mellitus without complications: Secondary | ICD-10-CM | POA: Diagnosis not present

## 2016-11-12 DIAGNOSIS — N2581 Secondary hyperparathyroidism of renal origin: Secondary | ICD-10-CM | POA: Diagnosis not present

## 2016-11-12 DIAGNOSIS — N186 End stage renal disease: Secondary | ICD-10-CM | POA: Diagnosis not present

## 2016-11-14 DIAGNOSIS — E119 Type 2 diabetes mellitus without complications: Secondary | ICD-10-CM | POA: Diagnosis not present

## 2016-11-14 DIAGNOSIS — N2581 Secondary hyperparathyroidism of renal origin: Secondary | ICD-10-CM | POA: Diagnosis not present

## 2016-11-14 DIAGNOSIS — E1129 Type 2 diabetes mellitus with other diabetic kidney complication: Secondary | ICD-10-CM | POA: Diagnosis not present

## 2016-11-14 DIAGNOSIS — Z992 Dependence on renal dialysis: Secondary | ICD-10-CM | POA: Diagnosis not present

## 2016-11-14 DIAGNOSIS — N186 End stage renal disease: Secondary | ICD-10-CM | POA: Diagnosis not present

## 2016-11-17 ENCOUNTER — Ambulatory Visit (INDEPENDENT_AMBULATORY_CARE_PROVIDER_SITE_OTHER): Payer: Medicare Other | Admitting: Internal Medicine

## 2016-11-17 ENCOUNTER — Encounter: Payer: Self-pay | Admitting: Internal Medicine

## 2016-11-17 DIAGNOSIS — N2581 Secondary hyperparathyroidism of renal origin: Secondary | ICD-10-CM | POA: Diagnosis not present

## 2016-11-17 DIAGNOSIS — E119 Type 2 diabetes mellitus without complications: Secondary | ICD-10-CM | POA: Diagnosis not present

## 2016-11-17 DIAGNOSIS — N186 End stage renal disease: Secondary | ICD-10-CM | POA: Diagnosis not present

## 2016-11-17 DIAGNOSIS — B182 Chronic viral hepatitis C: Secondary | ICD-10-CM

## 2016-11-17 DIAGNOSIS — D509 Iron deficiency anemia, unspecified: Secondary | ICD-10-CM | POA: Diagnosis not present

## 2016-11-17 LAB — PROTIME-INR
INR: 1.1
Prothrombin Time: 11.2 s (ref 9.0–11.5)

## 2016-11-17 NOTE — Progress Notes (Signed)
[facility] for Infectious Disease   CC: consideration for treatment for chronic hepatitis C  HPI:  +[name] is a 61 y.o. male who presents for initial evaluation and management of chronic hepatitis C.  Patient tested positive last year during routine screening. Hepatitis C-associated risk factors present are: none. Patient denies intranasal drug use, IV drug abuse, multiple sexual partners, tattoos. Patient has not had other studies performed. Results: none. Patient has not had prior treatment for Hepatitis C. Patient does not have a past history of liver disease. Patient does have a family history of liver disease. Patient does not  have associated signs or symptoms related to liver disease.  Records reviewed from Epic and no hepatitis C antibody found.      Patient does not have documented immunity to Hepatitis A. Patient does not have documented immunity to Hepatitis B.    Review of Systems:   Constitutional: negative for fatigue and malaise Cardiovascular: negative for dyspnea Gastrointestinal: negative for diarrhea Musculoskeletal: negative for myalgias and arthralgias All other systems reviewed and are negative       Past Medical History:  Diagnosis Date  . Alcohol abuse   . Congestive heart disease ([place]) 12/2011  . Diabetes mellitus   . Gout   . Hyperlipidemia   . Hyperlipidemia   . Hypertension   . Monoclonal gammopathies 02/25/2012  . Pedal edema   . Renal insufficiency   . Tobacco abuse     Prior to Admission medications   Medication Sig Start Date End Date Taking? Authorizing Provider  allopurinol (ZYLOPRIM) 100 MG tablet Take 100 mg by mouth 2 (two) times daily as needed.    Yes Historical Provider, MD  amLODipine (NORVASC) 10 MG tablet Take 10 mg by mouth at bedtime.   Yes Historical Provider, MD  amLODipine (NORVASC) 5 MG tablet Take 5 mg by mouth.  04/29/15  Yes Historical Provider, MD  aspirin EC 81 MG tablet Take 81 mg by mouth daily.   Yes  Historical Provider, MD  calcium acetate, Phos Binder, (PHOSLYRA) 667 MG/5ML SOLN Take 667 mg by mouth 3 (three) times daily with meals. Takes 4 capsules with each meal (3 times daily).   Yes Historical Provider, MD  carvedilol (COREG) 6.25 MG tablet Take 1 tablet (6.25 mg total) by mouth 2 (two) times daily with a meal. 01/13/12  Yes Radene Gunning, NP  cinacalcet (SENSIPAR) 30 MG tablet Take 30 mg by mouth daily.   Yes Historical Provider, MD  COLCRYS 0.6 MG tablet TK 1 C PO QD PRF GOUT 08/17/16  Yes Historical Provider, MD  gabapentin (NEURONTIN) 100 MG capsule TK 1 TO 3 CS PO ONCE D FOR ITCHING 09/23/16  Yes Historical Provider, MD  indomethacin (INDOCIN) 50 MG capsule Take 50 mg by mouth 2 (two) times daily as needed (gout flair up).    Yes Historical Provider, MD  lanthanum (FOSRENOL) 1000 MG chewable tablet Chew 1,000 mg by mouth 2 (two) times daily with a meal.   Yes Historical Provider, MD  latanoprost (XALATAN) 0.005 % ophthalmic solution Place 1 drop into both eyes at bedtime. 04/03/15  Yes Historical Provider, MD  LEVEMIR FLEXTOUCH 100 UNIT/ML Pen INJ 30 UNITS [place] [facility] 08/11/16  Yes Historical Provider, MD  loratadine (CLARITIN) 10 MG tablet TAKE 1 TABLET EVERY DAY 12/26/14  Yes Collene Gobble, MD  polyvinyl alcohol-povidone (REFRESH) 1.4-0.6 % ophthalmic solution 1-2 drops as needed.   Yes Historical Provider, MD  pravastatin (PRAVACHOL) 40  MG tablet Take 40 mg by mouth daily.   Yes Historical Provider, MD  NOVOLOG FLEXPEN 100 UNIT/ML FlexPen 15 Units. Injects 15 units before each meal (3 times daily), 08/26/16   Historical Provider, MD    Allergies  Allergen Reactions  . Penicillins Nausea And Vomiting and Other (See Comments)    Stomach pains also  . Vioxx [Rofecoxib] Nausea Only and Other (See Comments)    Gi upset.    Social History  Substance Use Topics  . Smoking status: Former Smoker    Packs/day: 0.30    Years: 33.00    Types: Cigarettes    Quit date: 07/16/2010  . Smokeless  tobacco: Never Used  . Alcohol use No    Family History  Problem Relation Age of Onset  . Lung cancer Father   . Diabetes Mother   . Heart disease Mother   he thinks a cousin had liver disease Cousin with renal disease   Objective:  Constitutional: in no apparent distress and alert,  Vitals:   11/17/16 0838  BP: (!) 160/82  Pulse: 81  Temp: 97.6 F (36.4 C)   Eyes: anicteric Cardiovascular: Cor RRR Respiratory: CTA B; normal respiratory effort Gastrointestinal: Bowel sounds are normal, liver is not enlarged, spleen is not enlarged Musculoskeletal: no pedal edema noted Skin: negatives: no rash; no porphyria cutanea tarda Lymphatic: no cervical lymphadenopathy   Laboratory Genotype: No results found for: HCVGENOTYPE HCV viral load: No results found for: HCVQUANT Lab Results  Component Value Date   WBC 7.1 01/09/2014   HGB 13.6 05/28/2014   HCT 40.0 05/28/2014   MCV 74.0 (L) 01/09/2014   PLT 236 01/09/2014    Lab Results  Component Value Date   CREATININE 11.80 (H) 05/14/2014   BUN 61 (H) 05/14/2014   NA 140 05/28/2014   K 5.0 05/28/2014   CL 98 05/14/2014   CO2 23 01/09/2014    Lab Results  Component Value Date   ALT 39 01/10/2012   AST 22 01/10/2012   ALKPHOS 89 01/10/2012     Labs and history reviewed and show CHILD-PUGH unknown  5-6 points: Child class A 7-9 points: Child class B 10-15 points: Child class C  Lab Results  Component Value Date   INR 0.96 03/13/2012   BILITOT 0.7 01/10/2012   ALBUMIN 3.0 (L) 01/09/2014     Assessment: New Patient with Chronic Hepatitis C genotype unknown, untreated, with ESRD.  I discussed with the patient the lab findings that confirm chronic hepatitis C as well as the natural history and progression of disease including about 30% of people who develop cirrhosis of the liver if left untreated and once cirrhosis is established there is a 2-7% risk per year of liver cancer and liver failure.  I discussed the  importance of treatment and benefits in reducing the risk, even if significant liver fibrosis exists.   Plan: 1) Patient counseled extensively on limiting acetaminophen to no more than 2 grams daily, avoidance of alcohol. 2) Transmission discussed with patient including sexual transmission, sharing razors and toothbrush.   3) Will need referral to gastroenterology if concern for cirrhosis 4) Will need referral for substance abuse counseling: No.; Further work up to include urine drug screen  No. 5) Will prescribe Mayvret for 8 weeks 12 weeks 6) Hepatitis A and B titers 8) Pneumovax vaccine previously given 9) Further work up to include liver staging with elastography 10) will follow up after starting medication 11) can use Mavyret or zepatier with  ESRD, will check NS5A 12) on pravastatin, will assess for myalgias during treatment

## 2016-11-17 NOTE — Patient Instructions (Signed)
Date 11/17/16  Dear Mr Villamizar, As discussed in the [facility], your hepatitis C therapy will include the following medications:          Mavyret (glecaprevir 100 mg/pibrentasvir 40 mg): Take 3 tablets by mouth once daily for 8 or 12 weeks                                                   OR      Zepatier 1 tab daily for 12 weeks ---------------------------------------------------------------- Your HCV Treatment Start Date: TBA   Your HCV genotype:  unknown    Liver Fibrosis: TBD    ---------------------------------------------------------------- YOUR PHARMACY CONTACT:   [place] [facility] [place] Phone: [phone] Hours: Monday to Friday 7:30 am to 6:00 pm   Please always contact your pharmacy at least 3-4 business days before you run out of medications to ensure your next month's medication is ready or 1 week prior to running out if you receive it by mail.  Remember, each prescription is for 28 days. ---------------------------------------------------------------- GENERAL NOTES REGARDING YOUR HEPATITIS C MEDICATION:  Mavyret: - tablets are pink, oblong shape - take 3 tablets daily with food. - The tablets should be stored at room temperature.  - Acid reducing agents such as H2 blockers (ie. Pepcid (famotidine), Zantac (ranitidine), Tagamet (cimetidine), Axid (nizatidine) and proton pump inhibitors (ie. Prilosec (omeprazole), Protonix (pantoprazole), Nexium (esomeprazole), or Aciphex (rabeprazole)) can decrease effectiveness of Harvoni. Do not take until you have discussed with a health care provider.    -Antacids that contain magnesium and/or aluminum hydroxide (ie. Milk of Magensia, Rolaids, Gaviscon, Maalox, Mylanta, an dArthritis Pain Formula) can reduce absorption of Harvoni, so take them at least 4 hours before or after Harvoni.  -Calcium carbonate (calcium supplements or antacids such as Tums, Caltrate,  Os-Cal) needs to be taken at least 4 hours hours before or after Harvoni.  -St. John's wort or any products that contain St. John's wort like some herbal supplements  Please inform the office prior to starting any of these medications.  - The common side effects associated with Mavyret include:      1. Fatigue      2. Headache      3. Nausea      4. Diarrhea      5. Insomnia  Please note that this only lists the most common side effects and is NOT a comprehensive list of the potential side effects of these medications. For more information, please review the drug information sheets that come with your medication package from the pharmacy.  ---------------------------------------------------------------- GENERAL HELPFUL HINTS ON HCV THERAPY: 1. Stay well-hydrated. 2. Notify the [facility] of any changes in your other over-the-counter/herbal or prescription medications. 3. If you miss a dose of your medication, take the missed dose as soon as you remember. Return to your regular time/dose schedule the next day.  4.  Do not stop taking your medications without first talking with your healthcare provider. 5.  You may take Tylenol (acetaminophen), as long as the dose is less than 2000 mg (OR no more than 4 tablets of the Tylenol Extra Strengths 500mg  tablet) in 24 hours. 6.  You will see our pharmacist-specialist within the first 2 weeks of starting your medication to monitor for any possible side effects.  7.  You will have labs once during treatment, after soon after treatment completion and one final lab 6 months after treatment completion to verify the virus is out of your system.  Scharlene Gloss, [place] for [place], [place]  [phone]

## 2016-11-18 ENCOUNTER — Telehealth: Payer: Self-pay | Admitting: *Deleted

## 2016-11-18 LAB — CBC WITH DIFFERENTIAL/PLATELET
BASOS PCT: 1 %
Basophils Absolute: 82 cells/uL (ref 0–200)
EOS PCT: 2 %
Eosinophils Absolute: 164 cells/uL (ref 15–500)
HEMATOCRIT: 43.9 % (ref 38.5–50.0)
Hemoglobin: 13.2 g/dL (ref 13.2–17.1)
LYMPHS PCT: 30 %
Lymphs Abs: 2460 cells/uL (ref 850–3900)
MCH: 24.1 pg — ABNORMAL LOW (ref 27.0–33.0)
MCHC: 30.1 g/dL — AB (ref 32.0–36.0)
MCV: 80.1 fL (ref 80.0–100.0)
MONO ABS: 492 {cells}/uL (ref 200–950)
MONOS PCT: 6 %
NEUTROS PCT: 61 %
Neutro Abs: 5002 cells/uL (ref 1500–7800)
Platelets: 185 10*3/uL (ref 140–400)
RBC: 5.48 MIL/uL (ref 4.20–5.80)
RDW: 18.4 % — AB (ref 11.0–15.0)
WBC: 8.2 10*3/uL (ref 3.8–10.8)

## 2016-11-18 LAB — HEPATITIS A ANTIBODY, TOTAL: Hep A Total Ab: REACTIVE — AB

## 2016-11-18 LAB — COMPLETE METABOLIC PANEL WITH GFR
ALBUMIN: 3.9 g/dL (ref 3.6–5.1)
ALK PHOS: 160 U/L — AB (ref 40–115)
ALT: 65 U/L — AB (ref 9–46)
AST: 51 U/L — AB (ref 10–35)
BILIRUBIN TOTAL: 0.6 mg/dL (ref 0.2–1.2)
BUN: 95 mg/dL — AB (ref 7–25)
CO2: 23 mmol/L (ref 20–31)
CREATININE: 16.29 mg/dL — AB (ref 0.70–1.33)
Calcium: 9.7 mg/dL (ref 8.6–10.3)
Chloride: 99 mmol/L (ref 98–110)
GFR, Est African American: <4 mL/min — ABNORMAL LOW (ref >=60)
GFR, Est Non African American: 3 mL/min — ABNORMAL LOW (ref >=60)
GLUCOSE: 181 mg/dL — AB (ref 65–99)
Potassium: 6.4 mmol/L (ref 3.5–5.3)
SODIUM: 142 mmol/L (ref 135–146)
TOTAL PROTEIN: 7.1 g/dL (ref 6.1–8.1)

## 2016-11-18 LAB — HEPATITIS B SURFACE ANTIBODY,QUALITATIVE: Hep B S Ab: POSITIVE — AB

## 2016-11-18 LAB — HEPATITIS B CORE ANTIBODY, TOTAL: Hep B Core Total Ab: NONREACTIVE

## 2016-11-18 LAB — HEPATITIS B SURFACE ANTIGEN: Hepatitis B Surface Ag: NEGATIVE

## 2016-11-18 NOTE — Telephone Encounter (Signed)
Received alert call from [facility] that the patient has an alert High potassium of 6.4 and Creat of 16.29 repeated and verified.

## 2016-11-18 NOTE — Telephone Encounter (Signed)
Pt is ESRD on dialysis. No need to address.

## 2016-11-19 DIAGNOSIS — N186 End stage renal disease: Secondary | ICD-10-CM | POA: Diagnosis not present

## 2016-11-19 DIAGNOSIS — N2581 Secondary hyperparathyroidism of renal origin: Secondary | ICD-10-CM | POA: Diagnosis not present

## 2016-11-19 DIAGNOSIS — E119 Type 2 diabetes mellitus without complications: Secondary | ICD-10-CM | POA: Diagnosis not present

## 2016-11-19 DIAGNOSIS — D509 Iron deficiency anemia, unspecified: Secondary | ICD-10-CM | POA: Diagnosis not present

## 2016-11-20 LAB — LIVER FIBROSIS, FIBROTEST-ACTITEST
ALPHA-2-MACROGLOBULIN: 220 mg/dL (ref 106–279)
ALT: 60 U/L — AB (ref 9–46)
APOLIPOPROTEIN A1: 126 mg/dL (ref 94–176)
BILIRUBIN: 0.6 mg/dL (ref 0.2–1.2)
FIBROSIS SCORE: 0.62
GGT: 149 U/L — ABNORMAL HIGH (ref 3–85)
Haptoglobin: 95 mg/dL (ref 43–212)
Necroinflammat ACT Score: 0.47
Reference ID: 1762838

## 2016-11-22 DIAGNOSIS — D509 Iron deficiency anemia, unspecified: Secondary | ICD-10-CM | POA: Diagnosis not present

## 2016-11-22 DIAGNOSIS — E119 Type 2 diabetes mellitus without complications: Secondary | ICD-10-CM | POA: Diagnosis not present

## 2016-11-22 DIAGNOSIS — N2581 Secondary hyperparathyroidism of renal origin: Secondary | ICD-10-CM | POA: Diagnosis not present

## 2016-11-22 DIAGNOSIS — N186 End stage renal disease: Secondary | ICD-10-CM | POA: Diagnosis not present

## 2016-11-22 LAB — HCV RNA, QUANT REAL-TIME PCR W/REFLEX
HCV RNA, PCR, QN (LOG): 6.1 {Log_IU}/mL — AB
HCV RNA, PCR, QN: 1260000 [IU]/mL — AB

## 2016-11-22 LAB — HCV RNA,LIPA RFLX NS5A DRUG RESIST

## 2016-11-22 MED ORDER — GLECAPREVIR-PIBRENTASVIR 100-40 MG PO TABS: 3.0000 | ORAL_TABLET | Freq: Every day | ORAL | 1 refills | Status: DC

## 2016-11-22 NOTE — Addendum Note (Signed)
Addended by: Thayer Headings on: 11/22/2016 06:54 AM   Modules accepted: Orders

## 2016-11-23 ENCOUNTER — Encounter: Payer: Self-pay | Admitting: Podiatry

## 2016-11-23 ENCOUNTER — Ambulatory Visit (HOSPITAL_COMMUNITY)
Admission: RE | Admit: 2016-11-23 | Discharge: 2016-11-23 | Disposition: A | Discharge status: Home / Self Care | Payer: Medicare Other | Source: Ambulatory Visit | Attending: Internal Medicine | Admitting: Internal Medicine

## 2016-11-23 ENCOUNTER — Ambulatory Visit (INDEPENDENT_AMBULATORY_CARE_PROVIDER_SITE_OTHER): Payer: Medicare Other | Admitting: Podiatry

## 2016-11-23 VITALS — BP 158/88 | HR 79 | Resp 18

## 2016-11-23 DIAGNOSIS — M79675 Pain in left toe(s): Secondary | ICD-10-CM

## 2016-11-23 DIAGNOSIS — K760 Fatty (change of) liver, not elsewhere classified: Secondary | ICD-10-CM | POA: Diagnosis not present

## 2016-11-23 DIAGNOSIS — M79674 Pain in right toe(s): Secondary | ICD-10-CM

## 2016-11-23 DIAGNOSIS — B351 Tinea unguium: Secondary | ICD-10-CM

## 2016-11-23 DIAGNOSIS — B182 Chronic viral hepatitis C: Secondary | ICD-10-CM

## 2016-11-23 NOTE — Patient Instructions (Signed)

## 2016-11-23 NOTE — Progress Notes (Signed)
Patient ID: [name], male   DOB: 10-30-1956, 61 y.o.   MRN: [id number]   Subjective: This patient presents for scheduled visit complaining of uncomfortable toenails walking wearing shoes and requests toenail debridement  Objective:  Orientated 3  Vascular: Mild lower extremity edema with mild calf tenderness left Mild lower extremity edema right DP pulses 1/4 bilaterally PT pulses 1/4 bilaterally Capillary reflex within normal limits  Neurological: Sensation to 10 g monofilament wire intact 2/5 bilaterally Vibratory sensation nonreactive bilaterally Ankle reflexes equal and reactive bilaterally  Dermatological: No open skin lesions bilaterally The toenails are hypertrophic, elongated, discolored, deformed and tender to direct palpation 6-10  Musculoskeletal: There is no restriction ankle, subtalar, midtarsal joints bilaterally Manual motor testing dorsi flexion, plantar flexion, inversion, eversion 5/5 bilaterally  Assessment: Peripheral arterial disease associated with decreased pulses Diabetic peripheral neuropathy associated with decreased sensation Edema left lower leg has been evaluated and a DVT has been ruled out, per patient Mycotic toenails 6-10  Plan: Debridement of toenails 6-10 mechanically annulectomy without any bleeding  Reappoint 3 months

## 2016-11-24 ENCOUNTER — Other Ambulatory Visit: Payer: Self-pay | Admitting: Pharmacist Clinician (PhC)/ Clinical Pharmacy Specialist

## 2016-11-24 DIAGNOSIS — N2581 Secondary hyperparathyroidism of renal origin: Secondary | ICD-10-CM | POA: Diagnosis not present

## 2016-11-24 DIAGNOSIS — N186 End stage renal disease: Secondary | ICD-10-CM | POA: Diagnosis not present

## 2016-11-24 DIAGNOSIS — B182 Chronic viral hepatitis C: Secondary | ICD-10-CM

## 2016-11-24 DIAGNOSIS — E119 Type 2 diabetes mellitus without complications: Secondary | ICD-10-CM | POA: Diagnosis not present

## 2016-11-24 DIAGNOSIS — D509 Iron deficiency anemia, unspecified: Secondary | ICD-10-CM | POA: Diagnosis not present

## 2016-11-24 MED ORDER — GLECAPREVIR-PIBRENTASVIR 100-40 MG PO TABS: 3.0000 | ORAL_TABLET | Freq: Every day | ORAL | 1 refills | Status: DC

## 2016-11-26 DIAGNOSIS — E119 Type 2 diabetes mellitus without complications: Secondary | ICD-10-CM | POA: Diagnosis not present

## 2016-11-26 DIAGNOSIS — D509 Iron deficiency anemia, unspecified: Secondary | ICD-10-CM | POA: Diagnosis not present

## 2016-11-26 DIAGNOSIS — N186 End stage renal disease: Secondary | ICD-10-CM | POA: Diagnosis not present

## 2016-11-26 DIAGNOSIS — N2581 Secondary hyperparathyroidism of renal origin: Secondary | ICD-10-CM | POA: Diagnosis not present

## 2016-11-29 DIAGNOSIS — N186 End stage renal disease: Secondary | ICD-10-CM | POA: Diagnosis not present

## 2016-11-29 DIAGNOSIS — H2512 Age-related nuclear cataract, left eye: Secondary | ICD-10-CM | POA: Diagnosis not present

## 2016-11-29 DIAGNOSIS — E119 Type 2 diabetes mellitus without complications: Secondary | ICD-10-CM | POA: Diagnosis not present

## 2016-11-29 DIAGNOSIS — N2581 Secondary hyperparathyroidism of renal origin: Secondary | ICD-10-CM | POA: Diagnosis not present

## 2016-11-29 DIAGNOSIS — D509 Iron deficiency anemia, unspecified: Secondary | ICD-10-CM | POA: Diagnosis not present

## 2016-12-01 DIAGNOSIS — D509 Iron deficiency anemia, unspecified: Secondary | ICD-10-CM | POA: Diagnosis not present

## 2016-12-01 DIAGNOSIS — N2581 Secondary hyperparathyroidism of renal origin: Secondary | ICD-10-CM | POA: Diagnosis not present

## 2016-12-01 DIAGNOSIS — E119 Type 2 diabetes mellitus without complications: Secondary | ICD-10-CM | POA: Diagnosis not present

## 2016-12-01 DIAGNOSIS — N186 End stage renal disease: Secondary | ICD-10-CM | POA: Diagnosis not present

## 2016-12-03 DIAGNOSIS — D509 Iron deficiency anemia, unspecified: Secondary | ICD-10-CM | POA: Diagnosis not present

## 2016-12-03 DIAGNOSIS — E119 Type 2 diabetes mellitus without complications: Secondary | ICD-10-CM | POA: Diagnosis not present

## 2016-12-03 DIAGNOSIS — N186 End stage renal disease: Secondary | ICD-10-CM | POA: Diagnosis not present

## 2016-12-03 DIAGNOSIS — N2581 Secondary hyperparathyroidism of renal origin: Secondary | ICD-10-CM | POA: Diagnosis not present

## 2016-12-06 DIAGNOSIS — D509 Iron deficiency anemia, unspecified: Secondary | ICD-10-CM | POA: Diagnosis not present

## 2016-12-06 DIAGNOSIS — N186 End stage renal disease: Secondary | ICD-10-CM | POA: Diagnosis not present

## 2016-12-06 DIAGNOSIS — E119 Type 2 diabetes mellitus without complications: Secondary | ICD-10-CM | POA: Diagnosis not present

## 2016-12-06 DIAGNOSIS — N2581 Secondary hyperparathyroidism of renal origin: Secondary | ICD-10-CM | POA: Diagnosis not present

## 2016-12-08 ENCOUNTER — Encounter: Payer: Self-pay | Admitting: Pharmacy Technician

## 2016-12-08 DIAGNOSIS — E119 Type 2 diabetes mellitus without complications: Secondary | ICD-10-CM | POA: Diagnosis not present

## 2016-12-08 DIAGNOSIS — N186 End stage renal disease: Secondary | ICD-10-CM | POA: Diagnosis not present

## 2016-12-08 DIAGNOSIS — N2581 Secondary hyperparathyroidism of renal origin: Secondary | ICD-10-CM | POA: Diagnosis not present

## 2016-12-08 DIAGNOSIS — D509 Iron deficiency anemia, unspecified: Secondary | ICD-10-CM | POA: Diagnosis not present

## 2016-12-10 DIAGNOSIS — D509 Iron deficiency anemia, unspecified: Secondary | ICD-10-CM | POA: Diagnosis not present

## 2016-12-10 DIAGNOSIS — N2581 Secondary hyperparathyroidism of renal origin: Secondary | ICD-10-CM | POA: Diagnosis not present

## 2016-12-10 DIAGNOSIS — E119 Type 2 diabetes mellitus without complications: Secondary | ICD-10-CM | POA: Diagnosis not present

## 2016-12-10 DIAGNOSIS — N186 End stage renal disease: Secondary | ICD-10-CM | POA: Diagnosis not present

## 2016-12-13 DIAGNOSIS — D509 Iron deficiency anemia, unspecified: Secondary | ICD-10-CM | POA: Diagnosis not present

## 2016-12-13 DIAGNOSIS — N2581 Secondary hyperparathyroidism of renal origin: Secondary | ICD-10-CM | POA: Diagnosis not present

## 2016-12-13 DIAGNOSIS — E119 Type 2 diabetes mellitus without complications: Secondary | ICD-10-CM | POA: Diagnosis not present

## 2016-12-13 DIAGNOSIS — N186 End stage renal disease: Secondary | ICD-10-CM | POA: Diagnosis not present

## 2016-12-14 ENCOUNTER — Ambulatory Visit (INDEPENDENT_AMBULATORY_CARE_PROVIDER_SITE_OTHER): Payer: Medicare Other | Admitting: Pharmacist Clinician (PhC)/ Clinical Pharmacy Specialist

## 2016-12-14 DIAGNOSIS — B182 Chronic viral hepatitis C: Secondary | ICD-10-CM

## 2016-12-14 NOTE — Progress Notes (Signed)
Agreed with Taylor's note. He is doing well on his [place]. We have schedule him for all of the labs and cure visit with [place]. No vaccines needed for him.

## 2016-12-14 NOTE — Progress Notes (Addendum)
HPI: [name] is a 61 y.o. male who presents today for Hepatitis C follow up.  No results found for: HCVGENOTYPE, HEPCGENOTYPE  Allergies: Allergies  Allergen Reactions  . Penicillins Nausea And Vomiting and Other (See Comments)    Stomach pains also  . Vioxx [Rofecoxib] Nausea Only and Other (See Comments)    Gi upset.    Vitals:    Past Medical History: Past Medical History:  Diagnosis Date  . Alcohol abuse   . Congestive heart disease ([place]) 12/2011  . Diabetes mellitus   . Gout   . Hyperlipidemia   . Hyperlipidemia   . Hypertension   . Monoclonal gammopathies 02/25/2012  . Pedal edema   . Renal insufficiency   . Tobacco abuse     Social History: Social History   Social History  . Marital status: Married    Spouse name: [name]  . Number of children: 2  . Years of education: 12   Occupational History  . Security    Social History Main Topics  . Smoking status: Former Smoker    Packs/day: 0.30    Years: 33.00    Types: Cigarettes    Quit date: 07/16/2010  . Smokeless tobacco: Never Used  . Alcohol use No  . Drug use: No  . Sexual activity: No   Other Topics Concern  . Not on file   Social History Narrative   Married, lives in [place] with wife.    Labs: Hep B S Ab (no units)  Date Value  11/17/2016 POS (A)   Hepatitis B Surface Ag (no units)  Date Value  11/17/2016 NEGATIVE   HCV Ab (no units)  Date Value  01/05/2014 NEGATIVE    No results found for: HCVGENOTYPE, HEPCGENOTYPE  No flowsheet data found.  AST (U/L)  Date Value  11/17/2016 51 (H)  01/10/2012 22   ALT (U/L)  Date Value  11/17/2016 65 (H)  11/17/2016 60 (H)  01/10/2012 39   INR (no units)  Date Value  11/17/2016 1.1  03/13/2012 0.96    CrCl: CrCl cannot be calculated (Patient's most recent lab result is older than the maximum 21 days allowed.).  Fibrosis Score: F3 as assessed by Fibrosure  Child-Pugh Score: A  Previous Treatment  Regimen: None  Assessment: [name] is a 61 year old male who recently started treatment for his Hepatitis C with Mavyret. He started treatment on 11/29/2016 (expected completion date 01/24/2017) and is getting his medications from [facility]. I called the pharmacy and confirmed his start date of 1/15. [name] reports that he has experienced a few headaches when he was starting the medication but they have since resolved and he is doing well. He has not started any new medications or over the counter products since starting [place].   Recommendations: Continue Mavyret daily for a total of 8 weeks Check VL in 2 weeks Scheduled SVR12 labs (04/13/2017) and follow up appointment with pharmacy clinic after completion of treatment  Dimitri Ped, PharmD, BCPS PGY-2 Infectious Diseases Pharmacy Resident Pager: [phone] 12/14/2016, 8:54 AM

## 2016-12-14 NOTE — Patient Instructions (Signed)
Continue taking Mavyret daily for a total of 8 weeks We will get labs when you come back in 2 weeks Remember to call the clinic if you have any questions or start any new medications

## 2016-12-15 DIAGNOSIS — D509 Iron deficiency anemia, unspecified: Secondary | ICD-10-CM | POA: Diagnosis not present

## 2016-12-15 DIAGNOSIS — E119 Type 2 diabetes mellitus without complications: Secondary | ICD-10-CM | POA: Diagnosis not present

## 2016-12-15 DIAGNOSIS — Z992 Dependence on renal dialysis: Secondary | ICD-10-CM | POA: Diagnosis not present

## 2016-12-15 DIAGNOSIS — N2581 Secondary hyperparathyroidism of renal origin: Secondary | ICD-10-CM | POA: Diagnosis not present

## 2016-12-15 DIAGNOSIS — E1129 Type 2 diabetes mellitus with other diabetic kidney complication: Secondary | ICD-10-CM | POA: Diagnosis not present

## 2016-12-15 DIAGNOSIS — N186 End stage renal disease: Secondary | ICD-10-CM | POA: Diagnosis not present

## 2016-12-17 DIAGNOSIS — D509 Iron deficiency anemia, unspecified: Secondary | ICD-10-CM | POA: Diagnosis not present

## 2016-12-17 DIAGNOSIS — E119 Type 2 diabetes mellitus without complications: Secondary | ICD-10-CM | POA: Diagnosis not present

## 2016-12-17 DIAGNOSIS — N186 End stage renal disease: Secondary | ICD-10-CM | POA: Diagnosis not present

## 2016-12-17 DIAGNOSIS — N2581 Secondary hyperparathyroidism of renal origin: Secondary | ICD-10-CM | POA: Diagnosis not present

## 2016-12-20 ENCOUNTER — Other Ambulatory Visit: Payer: Self-pay | Admitting: Internal Medicine

## 2016-12-20 ENCOUNTER — Telehealth: Payer: Self-pay | Admitting: Pharmacist Clinician (PhC)/ Clinical Pharmacy Specialist

## 2016-12-20 DIAGNOSIS — N2581 Secondary hyperparathyroidism of renal origin: Secondary | ICD-10-CM | POA: Diagnosis not present

## 2016-12-20 DIAGNOSIS — E119 Type 2 diabetes mellitus without complications: Secondary | ICD-10-CM | POA: Diagnosis not present

## 2016-12-20 DIAGNOSIS — N186 End stage renal disease: Secondary | ICD-10-CM | POA: Diagnosis not present

## 2016-12-20 DIAGNOSIS — D509 Iron deficiency anemia, unspecified: Secondary | ICD-10-CM | POA: Diagnosis not present

## 2016-12-20 DIAGNOSIS — B182 Chronic viral hepatitis C: Secondary | ICD-10-CM

## 2016-12-20 NOTE — Telephone Encounter (Signed)
Made [name] a cure appt for June for his hep C.

## 2016-12-20 NOTE — Telephone Encounter (Signed)
Yes.  Filled today and won't need any more after that.  thanks

## 2016-12-20 NOTE — Telephone Encounter (Signed)
Is this refill appropriate for Mavyret, last filled today?

## 2016-12-21 NOTE — Addendum Note (Signed)
Addended by: Janyce Llanos F on: 12/21/2016 11:02 AM   Modules accepted: Orders

## 2016-12-22 DIAGNOSIS — D509 Iron deficiency anemia, unspecified: Secondary | ICD-10-CM | POA: Diagnosis not present

## 2016-12-22 DIAGNOSIS — E119 Type 2 diabetes mellitus without complications: Secondary | ICD-10-CM | POA: Diagnosis not present

## 2016-12-22 DIAGNOSIS — N186 End stage renal disease: Secondary | ICD-10-CM | POA: Diagnosis not present

## 2016-12-22 DIAGNOSIS — N2581 Secondary hyperparathyroidism of renal origin: Secondary | ICD-10-CM | POA: Diagnosis not present

## 2016-12-24 DIAGNOSIS — E119 Type 2 diabetes mellitus without complications: Secondary | ICD-10-CM | POA: Diagnosis not present

## 2016-12-24 DIAGNOSIS — D509 Iron deficiency anemia, unspecified: Secondary | ICD-10-CM | POA: Diagnosis not present

## 2016-12-24 DIAGNOSIS — N2581 Secondary hyperparathyroidism of renal origin: Secondary | ICD-10-CM | POA: Diagnosis not present

## 2016-12-24 DIAGNOSIS — N186 End stage renal disease: Secondary | ICD-10-CM | POA: Diagnosis not present

## 2016-12-27 DIAGNOSIS — N186 End stage renal disease: Secondary | ICD-10-CM | POA: Diagnosis not present

## 2016-12-27 DIAGNOSIS — N2581 Secondary hyperparathyroidism of renal origin: Secondary | ICD-10-CM | POA: Diagnosis not present

## 2016-12-27 DIAGNOSIS — E119 Type 2 diabetes mellitus without complications: Secondary | ICD-10-CM | POA: Diagnosis not present

## 2016-12-27 DIAGNOSIS — D509 Iron deficiency anemia, unspecified: Secondary | ICD-10-CM | POA: Diagnosis not present

## 2016-12-29 DIAGNOSIS — N2581 Secondary hyperparathyroidism of renal origin: Secondary | ICD-10-CM | POA: Diagnosis not present

## 2016-12-29 DIAGNOSIS — D509 Iron deficiency anemia, unspecified: Secondary | ICD-10-CM | POA: Diagnosis not present

## 2016-12-29 DIAGNOSIS — N186 End stage renal disease: Secondary | ICD-10-CM | POA: Diagnosis not present

## 2016-12-29 DIAGNOSIS — E119 Type 2 diabetes mellitus without complications: Secondary | ICD-10-CM | POA: Diagnosis not present

## 2016-12-30 ENCOUNTER — Other Ambulatory Visit: Payer: Medicare Other

## 2016-12-30 DIAGNOSIS — B182 Chronic viral hepatitis C: Secondary | ICD-10-CM | POA: Diagnosis not present

## 2016-12-30 DIAGNOSIS — H2512 Age-related nuclear cataract, left eye: Secondary | ICD-10-CM | POA: Diagnosis not present

## 2016-12-31 DIAGNOSIS — D509 Iron deficiency anemia, unspecified: Secondary | ICD-10-CM | POA: Diagnosis not present

## 2016-12-31 DIAGNOSIS — N2581 Secondary hyperparathyroidism of renal origin: Secondary | ICD-10-CM | POA: Diagnosis not present

## 2016-12-31 DIAGNOSIS — E119 Type 2 diabetes mellitus without complications: Secondary | ICD-10-CM | POA: Diagnosis not present

## 2016-12-31 DIAGNOSIS — N186 End stage renal disease: Secondary | ICD-10-CM | POA: Diagnosis not present

## 2017-01-03 DIAGNOSIS — N186 End stage renal disease: Secondary | ICD-10-CM | POA: Diagnosis not present

## 2017-01-03 DIAGNOSIS — E119 Type 2 diabetes mellitus without complications: Secondary | ICD-10-CM | POA: Diagnosis not present

## 2017-01-03 DIAGNOSIS — N2581 Secondary hyperparathyroidism of renal origin: Secondary | ICD-10-CM | POA: Diagnosis not present

## 2017-01-03 DIAGNOSIS — D509 Iron deficiency anemia, unspecified: Secondary | ICD-10-CM | POA: Diagnosis not present

## 2017-01-05 DIAGNOSIS — N186 End stage renal disease: Secondary | ICD-10-CM | POA: Diagnosis not present

## 2017-01-05 DIAGNOSIS — N2581 Secondary hyperparathyroidism of renal origin: Secondary | ICD-10-CM | POA: Diagnosis not present

## 2017-01-05 DIAGNOSIS — D509 Iron deficiency anemia, unspecified: Secondary | ICD-10-CM | POA: Diagnosis not present

## 2017-01-05 DIAGNOSIS — E119 Type 2 diabetes mellitus without complications: Secondary | ICD-10-CM | POA: Diagnosis not present

## 2017-01-05 LAB — HEPATITIS C RNA QUANTITATIVE
HCV QUANT LOG: NOT DETECTED {Log_IU}/mL
HCV QUANT: NOT DETECTED [IU]/mL

## 2017-01-07 DIAGNOSIS — N2581 Secondary hyperparathyroidism of renal origin: Secondary | ICD-10-CM | POA: Diagnosis not present

## 2017-01-07 DIAGNOSIS — N186 End stage renal disease: Secondary | ICD-10-CM | POA: Diagnosis not present

## 2017-01-07 DIAGNOSIS — D509 Iron deficiency anemia, unspecified: Secondary | ICD-10-CM | POA: Diagnosis not present

## 2017-01-07 DIAGNOSIS — E119 Type 2 diabetes mellitus without complications: Secondary | ICD-10-CM | POA: Diagnosis not present

## 2017-01-10 DIAGNOSIS — E119 Type 2 diabetes mellitus without complications: Secondary | ICD-10-CM | POA: Diagnosis not present

## 2017-01-10 DIAGNOSIS — N2581 Secondary hyperparathyroidism of renal origin: Secondary | ICD-10-CM | POA: Diagnosis not present

## 2017-01-10 DIAGNOSIS — D509 Iron deficiency anemia, unspecified: Secondary | ICD-10-CM | POA: Diagnosis not present

## 2017-01-10 DIAGNOSIS — N186 End stage renal disease: Secondary | ICD-10-CM | POA: Diagnosis not present

## 2017-01-12 DIAGNOSIS — E1129 Type 2 diabetes mellitus with other diabetic kidney complication: Secondary | ICD-10-CM | POA: Diagnosis not present

## 2017-01-12 DIAGNOSIS — Z992 Dependence on renal dialysis: Secondary | ICD-10-CM | POA: Diagnosis not present

## 2017-01-12 DIAGNOSIS — N2581 Secondary hyperparathyroidism of renal origin: Secondary | ICD-10-CM | POA: Diagnosis not present

## 2017-01-12 DIAGNOSIS — E119 Type 2 diabetes mellitus without complications: Secondary | ICD-10-CM | POA: Diagnosis not present

## 2017-01-12 DIAGNOSIS — N186 End stage renal disease: Secondary | ICD-10-CM | POA: Diagnosis not present

## 2017-01-12 DIAGNOSIS — D509 Iron deficiency anemia, unspecified: Secondary | ICD-10-CM | POA: Diagnosis not present

## 2017-01-13 ENCOUNTER — Encounter: Payer: Self-pay | Admitting: Vascular Surgery

## 2017-01-13 ENCOUNTER — Telehealth: Payer: Self-pay | Admitting: Vascular Surgery

## 2017-01-13 ENCOUNTER — Ambulatory Visit: Payer: Medicare Other | Admitting: Vascular Surgery

## 2017-01-13 DIAGNOSIS — N2581 Secondary hyperparathyroidism of renal origin: Secondary | ICD-10-CM | POA: Diagnosis not present

## 2017-01-13 DIAGNOSIS — Z992 Dependence on renal dialysis: Secondary | ICD-10-CM | POA: Diagnosis not present

## 2017-01-13 DIAGNOSIS — T82898A Other specified complication of vascular prosthetic devices, implants and grafts, initial encounter: Secondary | ICD-10-CM | POA: Diagnosis not present

## 2017-01-13 DIAGNOSIS — E119 Type 2 diabetes mellitus without complications: Secondary | ICD-10-CM | POA: Diagnosis not present

## 2017-01-13 DIAGNOSIS — N186 End stage renal disease: Secondary | ICD-10-CM | POA: Diagnosis not present

## 2017-01-13 NOTE — Telephone Encounter (Signed)
LVM for pt at 10:30am for today's urgent appt at Dr.Patel's request.  I called the patient at 3:20pm due to him not coming for scheduled appt. I rescheduled him for 01/18/17 to see Dr.Early. I offered the patient an appt for tomorrow 01/14/17 but he declined due to transportation/work issues. awt

## 2017-01-14 DIAGNOSIS — E119 Type 2 diabetes mellitus without complications: Secondary | ICD-10-CM | POA: Diagnosis not present

## 2017-01-14 DIAGNOSIS — N2581 Secondary hyperparathyroidism of renal origin: Secondary | ICD-10-CM | POA: Diagnosis not present

## 2017-01-14 DIAGNOSIS — N186 End stage renal disease: Secondary | ICD-10-CM | POA: Diagnosis not present

## 2017-01-17 ENCOUNTER — Other Ambulatory Visit: Payer: Self-pay | Admitting: *Deleted

## 2017-01-17 ENCOUNTER — Encounter: Payer: Self-pay | Admitting: *Deleted

## 2017-01-17 ENCOUNTER — Ambulatory Visit (INDEPENDENT_AMBULATORY_CARE_PROVIDER_SITE_OTHER): Payer: Medicare Other | Admitting: Surgery

## 2017-01-17 ENCOUNTER — Encounter: Payer: Self-pay | Admitting: Surgery

## 2017-01-17 VITALS — BP 107/73 | HR 82 | Temp 98.0°F | Resp 24 | Ht 73.0 in | Wt 343.0 lb

## 2017-01-17 DIAGNOSIS — E119 Type 2 diabetes mellitus without complications: Secondary | ICD-10-CM | POA: Diagnosis not present

## 2017-01-17 DIAGNOSIS — N186 End stage renal disease: Secondary | ICD-10-CM

## 2017-01-17 DIAGNOSIS — Z992 Dependence on renal dialysis: Secondary | ICD-10-CM

## 2017-01-17 DIAGNOSIS — N2581 Secondary hyperparathyroidism of renal origin: Secondary | ICD-10-CM | POA: Diagnosis not present

## 2017-01-17 NOTE — Progress Notes (Signed)
Vascular and Vein Specialist of [facility]  Patient name: [name] MRN: [id number] DOB: 1956-05-06 Sex: male   REASON FOR VISIT:    Fistula ulcer  HISOTRY OF PRESENT ILLNESS:    [name] is a 61 y.o. male who comes in today for evaluation of a ulcer on his right radiocephalic fistula which was created several years ago.  He did undergo branch ligation in order for his fistula to mature.  The patient currently is on dialysis Monday Wednesday Friday.  He is medically managed for hypercholesterolemia.  He takes a statin for hypercholesterolemia.  He is on multiple medications for diabetes.   PAST MEDICAL HISTORY:   Past Medical History:  Diagnosis Date  . Alcohol abuse   . Congestive heart disease ([place]) 12/2011  . Diabetes mellitus   . Gout   . Hyperlipidemia   . Hyperlipidemia   . Hypertension   . Monoclonal gammopathies 02/25/2012  . Pedal edema   . Renal insufficiency   . Tobacco abuse      FAMILY HISTORY:   Family History  Problem Relation Age of Onset  . Diabetes Mother   . Heart disease Mother   . Lung cancer Father     SOCIAL HISTORY:   Social History  Substance Use Topics  . Smoking status: Former Smoker    Packs/day: 0.30    Years: 33.00    Types: Cigarettes    Quit date: 07/16/2010  . Smokeless tobacco: Never Used  . Alcohol use No     ALLERGIES:   Allergies  Allergen Reactions  . Penicillins Nausea And Vomiting and Other (See Comments)    Stomach pains also  . Vioxx [Rofecoxib] Nausea Only and Other (See Comments)    Gi upset.     CURRENT MEDICATIONS:   Current Outpatient Prescriptions  Medication Sig Dispense Refill  . allopurinol (ZYLOPRIM) 100 MG tablet Take 100 mg by mouth 2 (two) times daily as needed.     Marland Kitchen amLODipine (NORVASC) 10 MG tablet Take 10 mg by mouth at bedtime.    Marland Kitchen amLODipine (NORVASC) 5 MG tablet Take 5 mg by mouth.   5  . aspirin EC 81 MG tablet Take 81 mg by mouth  daily.    . calcium acetate, Phos Binder, (PHOSLYRA) 667 MG/5ML SOLN Take 667 mg by mouth 3 (three) times daily with meals. Takes 4 capsules with each meal (3 times daily).    . carvedilol (COREG) 6.25 MG tablet Take 1 tablet (6.25 mg total) by mouth 2 (two) times daily with a meal. 60 tablet 0  . cinacalcet (SENSIPAR) 30 MG tablet Take 30 mg by mouth daily.    Marland Kitchen COLCRYS 0.6 MG tablet TK 1 C PO QD PRF GOUT  5  . gabapentin (NEURONTIN) 100 MG capsule TK 1 TO 3 CS PO ONCE D FOR ITCHING  12  . indomethacin (INDOCIN) 50 MG capsule Take 50 mg by mouth 2 (two) times daily as needed (gout flair up).     Marland Kitchen lanthanum (FOSRENOL) 1000 MG chewable tablet Chew 1,000 mg by mouth 2 (two) times daily with a meal.    . latanoprost (XALATAN) 0.005 % ophthalmic solution Place 1 drop into both eyes at bedtime.  12  . LEVEMIR FLEXTOUCH 100 UNIT/ML Pen INJ 30 UNITS [place] HS  6  . loratadine (CLARITIN) 10 MG tablet TAKE 1 TABLET EVERY DAY 30 tablet 1  . NOVOLOG FLEXPEN 100 UNIT/ML FlexPen 15 Units. Injects 15 units before each meal (  3 times daily),  6  . polyvinyl alcohol-povidone (REFRESH) 1.4-0.6 % ophthalmic solution 1-2 drops as needed.    . pravastatin (PRAVACHOL) 40 MG tablet Take 40 mg by mouth daily.     No current facility-administered medications for this visit.     REVIEW OF SYSTEMS:   [X]  denotes positive finding, [ ]  denotes negative finding Cardiac  Comments:  Chest pain or chest pressure:    Shortness of breath upon exertion:    Short of breath when lying flat:    Irregular heart rhythm:        Vascular    Pain in calf, thigh, or hip brought on by ambulation:    Pain in feet at night that wakes you up from your sleep:     Blood clot in your veins:    Leg swelling:         Pulmonary    Oxygen at home:    Productive cough:     Wheezing:         Neurologic    Sudden weakness in arms or legs:     Sudden numbness in arms or legs:     Sudden onset of difficulty speaking or slurred speech:      Temporary loss of vision in one eye:     Problems with dizziness:         Gastrointestinal    Blood in stool:     Vomited blood:         Genitourinary    Burning when urinating:     Blood in urine:        Psychiatric    Major depression:         Hematologic    Bleeding problems:    Problems with blood clotting too easily:        Skin    Rashes or ulcers:        Constitutional    Fever or chills:      PHYSICAL EXAM:   Vitals:   01/17/17 1515  BP: 107/73  Pulse: 82  Resp: (!) 24  Temp: 98 F (36.7 C)  TempSrc: Oral  SpO2: 92%  Weight: (!) 343 lb (155.6 kg)  Height: 6\' 1"  (1.854 m)    GENERAL: The patient is a well-nourished male, in no acute distress. The vital signs are documented above. CARDIAC: There is a regular rate and rhythm.  VASCULAR: Palpable thrill within right radiocephalic fistula ulcer.  There are 2 aneurysmal areas the more distal one near the arterial venous anastomosis has a thinned out area with a eschar over top of this. PULMONARY: Non-labored respirations MUSCULOSKELETAL: There are no major deformities or cyanosis. NEUROLOGIC: No focal weakness or paresthesias are detected. SKIN: There are no ulcers or rashes noted. PSYCHIATRIC: The patient has a normal affect.  STUDIES:   I have reviewed the images from [facility] which did not show any evidence of stenosis within the fistula in the arm.  Central venous images were taken, however they were difficult to interpret.  I did not see any obvious central venous stenosis  MEDICAL ISSUES:   Ulcerated area on right radiocephalic fistula overlying a small pseudoaneurysm: I discussed with the patient that this needs to be repaired.  More than likely I will be able to resect the area of skin overlying the fistula and then perform plication of the aneurysm followed by closure.  This is going to be scheduled for Wednesday, March 7    Annamarie Major, MD  Vascular and Vein Specialists of  [facility] [phone] Pager [phone]

## 2017-01-18 ENCOUNTER — Encounter (HOSPITAL_COMMUNITY): Payer: Self-pay | Admitting: *Deleted

## 2017-01-18 ENCOUNTER — Ambulatory Visit: Payer: Medicare Other | Admitting: Vascular Surgery

## 2017-01-18 MED ORDER — VANCOMYCIN HCL 10 G IV SOLR
1500.0000 mg | INTRAVENOUS | Status: AC
  Administered 2017-01-19: 1500 mg via INTRAVENOUS
  Filled 2017-01-18: qty 1500

## 2017-01-18 MED ORDER — SODIUM CHLORIDE 0.9 % IV SOLN
INTRAVENOUS | Status: DC
  Administered 2017-01-19: 11:00:00 via INTRAVENOUS

## 2017-01-18 NOTE — Progress Notes (Signed)
Spoke with pt's wife for pre-op call, pt handed phone to his wife. She denies cardiac history, chest pain or sob for pt. Pt is a type 2 diabetic. She is not sure what his A1C but stated that it was drawn at Dr. Fransico Setters office not long ago. I called Dr. Fransico Setters office and they do not have him as a pt. Pt's wife states pt usually has the dialysis center test his blood sugar and he told her it's been running "high" lately, but couldn't give a number. Pt is to have 3 hours of dialysis in the AM and then to arrive here at 10:30 AM per Dr. Stephens Shire instruction. I instructed pt's wife to have pt take 1/2 of his regular dose of Levemir insulin this evening (will take 15 units) and none of his Novolog in the AM. I instructed her to have pt check blood sugar in the AM when he gets up and every 2 hours prior to arrival to the hospital. She states she will have him have dialysis to check it for him. If blood sugar is 70 or below, treat with 1/2 cup of clear juice (apple or cranberry) and recheck blood sugar 15 minutes after drinking juice. If blood sugar continues to be 70 or below, call the Short Stay department and ask to speak to a nurse. She voiced understanding.

## 2017-01-19 ENCOUNTER — Ambulatory Visit (HOSPITAL_COMMUNITY): Payer: Medicare Other | Admitting: Certified Registered Nurse Anesthetist

## 2017-01-19 ENCOUNTER — Ambulatory Visit (HOSPITAL_COMMUNITY)
Admission: RE | Admit: 2017-01-19 | Discharge: 2017-01-19 | Disposition: A | Discharge status: Home / Self Care | Payer: Medicare Other | Source: Ambulatory Visit | Attending: Surgery | Admitting: Surgery

## 2017-01-19 ENCOUNTER — Encounter (HOSPITAL_COMMUNITY)
Admission: RE | Disposition: A | Discharge status: Home / Self Care | Payer: Self-pay | Source: Ambulatory Visit | Attending: Surgery

## 2017-01-19 ENCOUNTER — Encounter (HOSPITAL_COMMUNITY): Payer: Self-pay | Admitting: *Deleted

## 2017-01-19 DIAGNOSIS — Z833 Family history of diabetes mellitus: Secondary | ICD-10-CM | POA: Diagnosis not present

## 2017-01-19 DIAGNOSIS — I132 Hypertensive heart and chronic kidney disease with heart failure and with stage 5 chronic kidney disease, or end stage renal disease: Secondary | ICD-10-CM | POA: Insufficient documentation

## 2017-01-19 DIAGNOSIS — Z992 Dependence on renal dialysis: Secondary | ICD-10-CM | POA: Insufficient documentation

## 2017-01-19 DIAGNOSIS — L98491 Non-pressure chronic ulcer of skin of other sites limited to breakdown of skin: Secondary | ICD-10-CM | POA: Insufficient documentation

## 2017-01-19 DIAGNOSIS — Z8249 Family history of ischemic heart disease and other diseases of the circulatory system: Secondary | ICD-10-CM | POA: Diagnosis not present

## 2017-01-19 DIAGNOSIS — Z880 Allergy status to penicillin: Secondary | ICD-10-CM | POA: Diagnosis not present

## 2017-01-19 DIAGNOSIS — E1122 Type 2 diabetes mellitus with diabetic chronic kidney disease: Secondary | ICD-10-CM | POA: Insufficient documentation

## 2017-01-19 DIAGNOSIS — I509 Heart failure, unspecified: Secondary | ICD-10-CM | POA: Insufficient documentation

## 2017-01-19 DIAGNOSIS — T82898A Other specified complication of vascular prosthetic devices, implants and grafts, initial encounter: Secondary | ICD-10-CM | POA: Insufficient documentation

## 2017-01-19 DIAGNOSIS — M109 Gout, unspecified: Secondary | ICD-10-CM | POA: Insufficient documentation

## 2017-01-19 DIAGNOSIS — Z7982 Long term (current) use of aspirin: Secondary | ICD-10-CM | POA: Diagnosis not present

## 2017-01-19 DIAGNOSIS — N186 End stage renal disease: Secondary | ICD-10-CM | POA: Insufficient documentation

## 2017-01-19 DIAGNOSIS — D472 Monoclonal gammopathy: Secondary | ICD-10-CM | POA: Insufficient documentation

## 2017-01-19 DIAGNOSIS — Z888 Allergy status to other drugs, medicaments and biological substances status: Secondary | ICD-10-CM | POA: Insufficient documentation

## 2017-01-19 DIAGNOSIS — Z79899 Other long term (current) drug therapy: Secondary | ICD-10-CM | POA: Diagnosis not present

## 2017-01-19 DIAGNOSIS — N2581 Secondary hyperparathyroidism of renal origin: Secondary | ICD-10-CM | POA: Diagnosis not present

## 2017-01-19 DIAGNOSIS — X58XXXA Exposure to other specified factors, initial encounter: Secondary | ICD-10-CM | POA: Insufficient documentation

## 2017-01-19 DIAGNOSIS — Z794 Long term (current) use of insulin: Secondary | ICD-10-CM | POA: Diagnosis not present

## 2017-01-19 DIAGNOSIS — I878 Other specified disorders of veins: Secondary | ICD-10-CM | POA: Insufficient documentation

## 2017-01-19 DIAGNOSIS — Z87891 Personal history of nicotine dependence: Secondary | ICD-10-CM | POA: Insufficient documentation

## 2017-01-19 DIAGNOSIS — I770 Arteriovenous fistula, acquired: Secondary | ICD-10-CM | POA: Diagnosis not present

## 2017-01-19 DIAGNOSIS — E119 Type 2 diabetes mellitus without complications: Secondary | ICD-10-CM | POA: Diagnosis not present

## 2017-01-19 DIAGNOSIS — Z801 Family history of malignant neoplasm of trachea, bronchus and lung: Secondary | ICD-10-CM | POA: Insufficient documentation

## 2017-01-19 DIAGNOSIS — E7800 Pure hypercholesterolemia, unspecified: Secondary | ICD-10-CM | POA: Diagnosis not present

## 2017-01-19 DIAGNOSIS — I10 Essential (primary) hypertension: Secondary | ICD-10-CM | POA: Diagnosis not present

## 2017-01-19 DIAGNOSIS — E785 Hyperlipidemia, unspecified: Secondary | ICD-10-CM | POA: Diagnosis not present

## 2017-01-19 HISTORY — DX: Sleep apnea, unspecified: G47.30

## 2017-01-19 HISTORY — DX: Inflammatory liver disease, unspecified: K75.9

## 2017-01-19 HISTORY — PX: REVISON OF ARTERIOVENOUS FISTULA: SHX6074

## 2017-01-19 LAB — GLUCOSE, CAPILLARY: Glucose-Capillary: 231 mg/dL — ABNORMAL HIGH (ref 65–99)

## 2017-01-19 LAB — POCT I-STAT 4, (NA,K, GLUC, HGB,HCT)
GLUCOSE: 298 mg/dL — AB (ref 65–99)
HCT: 47 % (ref 39.0–52.0)
Hemoglobin: 16 g/dL (ref 13.0–17.0)
Potassium: 4.2 mmol/L (ref 3.5–5.1)
Sodium: 137 mmol/L (ref 135–145)

## 2017-01-19 SURGERY — REVISON OF ARTERIOVENOUS FISTULA
Anesthesia: General | Site: Arm Lower | Laterality: Right

## 2017-01-19 MED ORDER — CHLORHEXIDINE GLUCONATE CLOTH 2 % EX PADS: 6.0000 | MEDICATED_PAD | Freq: Once | CUTANEOUS | Status: DC

## 2017-01-19 MED ORDER — LIDOCAINE-EPINEPHRINE (PF) 1 %-1:200000 IJ SOLN
INTRAMUSCULAR | Status: AC
  Filled 2017-01-19: qty 30

## 2017-01-19 MED ORDER — PHENYLEPHRINE HCL 10 MG/ML IJ SOLN
INTRAVENOUS | Status: DC | PRN
  Administered 2017-01-19: 25 ug/min via INTRAVENOUS

## 2017-01-19 MED ORDER — PROPOFOL 10 MG/ML IV BOLUS
INTRAVENOUS | Status: DC | PRN
  Administered 2017-01-19: 200 mg via INTRAVENOUS

## 2017-01-19 MED ORDER — PROPOFOL 10 MG/ML IV BOLUS
INTRAVENOUS | Status: AC
  Filled 2017-01-19: qty 40

## 2017-01-19 MED ORDER — HEPARIN SODIUM (PORCINE) 1000 UNIT/ML IJ SOLN
INTRAMUSCULAR | Status: DC | PRN
  Administered 2017-01-19: 3000 [IU] via INTRAVENOUS

## 2017-01-19 MED ORDER — ONDANSETRON HCL 4 MG/2ML IJ SOLN
INTRAMUSCULAR | Status: AC
  Filled 2017-01-19: qty 2

## 2017-01-19 MED ORDER — PROMETHAZINE HCL 25 MG/ML IJ SOLN: 6.2500 mg | INTRAMUSCULAR | Status: DC | PRN

## 2017-01-19 MED ORDER — EPHEDRINE 5 MG/ML INJ
INTRAVENOUS | Status: AC
  Filled 2017-01-19: qty 10

## 2017-01-19 MED ORDER — FENTANYL CITRATE (PF) 250 MCG/5ML IJ SOLN
INTRAMUSCULAR | Status: AC
  Filled 2017-01-19: qty 5

## 2017-01-19 MED ORDER — FENTANYL CITRATE (PF) 100 MCG/2ML IJ SOLN
INTRAMUSCULAR | Status: DC | PRN
  Administered 2017-01-19: 150 ug via INTRAVENOUS

## 2017-01-19 MED ORDER — HYDROMORPHONE HCL 1 MG/ML IJ SOLN
0.2500 mg | INTRAMUSCULAR | Status: DC | PRN
  Administered 2017-01-19: 0.5 mg via INTRAVENOUS

## 2017-01-19 MED ORDER — INSULIN ASPART 100 UNIT/ML ~~LOC~~ SOLN
SUBCUTANEOUS | Status: AC
  Filled 2017-01-19: qty 1

## 2017-01-19 MED ORDER — MIDAZOLAM HCL 2 MG/2ML IJ SOLN
INTRAMUSCULAR | Status: AC
  Filled 2017-01-19: qty 2

## 2017-01-19 MED ORDER — LIDOCAINE HCL (CARDIAC) 20 MG/ML IV SOLN
INTRAVENOUS | Status: DC | PRN
  Administered 2017-01-19: 100 mg via INTRAVENOUS

## 2017-01-19 MED ORDER — PROTAMINE SULFATE 10 MG/ML IV SOLN
INTRAVENOUS | Status: DC | PRN
  Administered 2017-01-19: 20 mg via INTRAVENOUS

## 2017-01-19 MED ORDER — SUCCINYLCHOLINE CHLORIDE 200 MG/10ML IV SOSY
PREFILLED_SYRINGE | INTRAVENOUS | Status: AC
  Filled 2017-01-19: qty 10

## 2017-01-19 MED ORDER — INSULIN ASPART 100 UNIT/ML ~~LOC~~ SOLN
12.0000 [IU] | Freq: Once | SUBCUTANEOUS | Status: AC
  Administered 2017-01-19: 12 [IU] via SUBCUTANEOUS

## 2017-01-19 MED ORDER — ONDANSETRON HCL 4 MG/2ML IJ SOLN
INTRAMUSCULAR | Status: DC | PRN
Start: 2017-01-19 — End: 2017-01-19
  Administered 2017-01-19: 4 mg via INTRAVENOUS

## 2017-01-19 MED ORDER — SUGAMMADEX SODIUM 200 MG/2ML IV SOLN
INTRAVENOUS | Status: AC
  Filled 2017-01-19: qty 2

## 2017-01-19 MED ORDER — LIDOCAINE 2% (20 MG/ML) 5 ML SYRINGE
INTRAMUSCULAR | Status: AC
  Filled 2017-01-19: qty 5

## 2017-01-19 MED ORDER — SUCCINYLCHOLINE CHLORIDE 200 MG/10ML IV SOSY
PREFILLED_SYRINGE | INTRAVENOUS | Status: DC | PRN
  Administered 2017-01-19: 120 mg via INTRAVENOUS

## 2017-01-19 MED ORDER — 0.9 % SODIUM CHLORIDE (POUR BTL) OPTIME
TOPICAL | Status: DC | PRN
  Administered 2017-01-19: 1000 mL

## 2017-01-19 MED ORDER — OXYCODONE-ACETAMINOPHEN 5-325 MG PO TABS: 1.0000 | ORAL_TABLET | Freq: Four times a day (QID) | ORAL | 0 refills | Status: DC | PRN

## 2017-01-19 MED ORDER — INSULIN ASPART 100 UNIT/ML ~~LOC~~ SOLN
20.0000 [IU] | Freq: Once | SUBCUTANEOUS | Status: AC
  Administered 2017-01-19: 20 [IU] via SUBCUTANEOUS

## 2017-01-19 MED ORDER — MIDAZOLAM HCL 5 MG/5ML IJ SOLN
INTRAMUSCULAR | Status: DC | PRN
  Administered 2017-01-19: 2 mg via INTRAVENOUS

## 2017-01-19 MED ORDER — ROCURONIUM BROMIDE 50 MG/5ML IV SOSY
PREFILLED_SYRINGE | INTRAVENOUS | Status: AC
  Filled 2017-01-19: qty 5

## 2017-01-19 MED ORDER — PHENYLEPHRINE 40 MCG/ML (10ML) SYRINGE FOR IV PUSH (FOR BLOOD PRESSURE SUPPORT)
PREFILLED_SYRINGE | INTRAVENOUS | Status: AC
  Filled 2017-01-19: qty 10

## 2017-01-19 MED ORDER — SODIUM CHLORIDE 0.9 % IV SOLN
INTRAVENOUS | Status: DC | PRN
  Administered 2017-01-19: 13:00:00

## 2017-01-19 MED ORDER — HYDROMORPHONE HCL 1 MG/ML IJ SOLN
INTRAMUSCULAR | Status: DC
Start: 2017-01-19 — End: 2017-01-19
  Filled 2017-01-19: qty 0.5

## 2017-01-19 SURGICAL SUPPLY — 39 items
ARMBAND PINK RESTRICT EXTREMIT (MISCELLANEOUS) ×2 IMPLANT
CANISTER SUCT 3000ML PPV (MISCELLANEOUS) ×2 IMPLANT
CLIP TI MEDIUM 6 (CLIP) ×2 IMPLANT
CLIP TI WIDE RED SMALL 6 (CLIP) ×2 IMPLANT
COVER PROBE W GEL 5X96 (DRAPES) ×2 IMPLANT
CUFF TOURNIQUET SINGLE 24IN (TOURNIQUET CUFF) ×2 IMPLANT
DERMABOND ADVANCED (GAUZE/BANDAGES/DRESSINGS) ×1
DERMABOND ADVANCED .7 DNX12 (GAUZE/BANDAGES/DRESSINGS) ×1 IMPLANT
DRAPE U-SHAPE 47X51 STRL (DRAPES) ×2 IMPLANT
ELECT REM PT RETURN 9FT ADLT (ELECTROSURGICAL) ×2
ELECTRODE REM PT RTRN 9FT ADLT (ELECTROSURGICAL) ×1 IMPLANT
GLOVE BIOGEL PI IND STRL 6.5 (GLOVE) ×3 IMPLANT
GLOVE BIOGEL PI IND STRL 7.0 (GLOVE) ×1 IMPLANT
GLOVE BIOGEL PI IND STRL 7.5 (GLOVE) ×2 IMPLANT
GLOVE BIOGEL PI INDICATOR 6.5 (GLOVE) ×3
GLOVE BIOGEL PI INDICATOR 7.0 (GLOVE) ×1
GLOVE BIOGEL PI INDICATOR 7.5 (GLOVE) ×2
GLOVE ECLIPSE 6.5 STRL STRAW (GLOVE) ×2 IMPLANT
GLOVE ECLIPSE 7.0 STRL STRAW (GLOVE) ×2 IMPLANT
GLOVE SURG SS PI 6.5 STRL IVOR (GLOVE) ×2 IMPLANT
GLOVE SURG SS PI 7.5 STRL IVOR (GLOVE) ×2 IMPLANT
GOWN STRL REUS W/ TWL LRG LVL3 (GOWN DISPOSABLE) ×2 IMPLANT
GOWN STRL REUS W/ TWL XL LVL3 (GOWN DISPOSABLE) ×1 IMPLANT
GOWN STRL REUS W/TWL LRG LVL3 (GOWN DISPOSABLE) ×2
GOWN STRL REUS W/TWL XL LVL3 (GOWN DISPOSABLE) ×1
HEMOSTAT SNOW SURGICEL 2X4 (HEMOSTASIS) IMPLANT
KIT BASIN OR (CUSTOM PROCEDURE TRAY) ×2 IMPLANT
KIT ROOM TURNOVER OR (KITS) ×2 IMPLANT
LOOP VESSEL MAXI BLUE (MISCELLANEOUS) ×2 IMPLANT
NS IRRIG 1000ML POUR BTL (IV SOLUTION) ×2 IMPLANT
PACK CV ACCESS (CUSTOM PROCEDURE TRAY) ×2 IMPLANT
PAD ARMBOARD 7.5X6 YLW CONV (MISCELLANEOUS) ×4 IMPLANT
SUT PROLENE 5 0 C 1 24 (SUTURE) ×2 IMPLANT
SUT PROLENE 6 0 CC (SUTURE) ×2 IMPLANT
SUT VIC AB 3-0 SH 27 (SUTURE) ×1
SUT VIC AB 3-0 SH 27X BRD (SUTURE) ×1 IMPLANT
SUT VICRYL 4-0 PS2 18IN ABS (SUTURE) IMPLANT
UNDERPAD 30X30 (UNDERPADS AND DIAPERS) ×2 IMPLANT
WATER STERILE IRR 1000ML POUR (IV SOLUTION) ×2 IMPLANT

## 2017-01-19 NOTE — Interval H&P Note (Signed)
History and Physical Interval Note:  01/19/2017 11:51 AM  [name]  has presented today for surgery, with the diagnosis of Right arm fistula pseudoaneurysm  The various methods of treatment have been discussed with the patient and family. After consideration of risks, benefits and other options for treatment, the patient has consented to  Procedure(s): [name] (Right) as a surgical intervention .  The patient's history has been reviewed, patient examined, no change in status, stable for surgery.  I have reviewed the patient's chart and labs.  Questions were answered to the patient's satisfaction.     Annamarie Major

## 2017-01-19 NOTE — Transfer of Care (Signed)
Immediate Anesthesia Transfer of Care Note  Patient: [name]  Procedure(s) Performed: Procedure(s): PLICATION REPAIR OF ARTERIOVENOUS FISTULA PSEUDOANEURYSM (Right)  Patient Location: PACU  Anesthesia Type:General  Level of Consciousness: awake, alert  and oriented  Airway & Oxygen Therapy: Patient Spontanous Breathing and Patient connected to nasal cannula oxygen  Post-op Assessment: Report given to RN, Post -op Vital signs reviewed and stable and Patient moving all extremities X 4  Post vital signs: Reviewed and stable  Last Vitals:  Vitals:   01/19/17 1042 01/19/17 1046  BP: (!) 94/51 (!) 94/51  Pulse: 81 81  Resp: 18 18  Temp: 36.6 C 36.6 C    Last Pain:  Vitals:   01/19/17 1046  TempSrc: Oral      Patients Stated Pain Goal: 6 (38/18/29 9371)  Complications: No apparent anesthesia complications

## 2017-01-19 NOTE — Progress Notes (Signed)
Dr foster at bedside aware of [id number] order obtained and carried out

## 2017-01-19 NOTE — Anesthesia Preprocedure Evaluation (Signed)
Anesthesia Evaluation  Patient identified by MRN, date of birth, ID band Patient awake    Reviewed: Allergy & Precautions, H&P , NPO status , Patient's Chart, lab work & pertinent test results  Airway Mallampati: II  TM Distance: >3 FB Neck ROM: full    Dental  (+) Dental Advidsory Given, Missing   Pulmonary sleep apnea , former smoker,    Pulmonary exam normal        Cardiovascular hypertension, Pt. on medications +CHF  Normal cardiovascular exam     Neuro/Psych    GI/Hepatic negative GI ROS,   Endo/Other  diabetesMorbid obesity  Renal/GU ESRFRenal disease     Musculoskeletal   Abdominal   Peds  Hematology   Anesthesia Other Findings   Reproductive/Obstetrics                             Anesthesia Physical  Anesthesia Plan  ASA: III  Anesthesia Plan: General   Post-op Pain Management:    Induction: Intravenous  Airway Management Planned: Oral ETT  Additional Equipment:   Intra-op Plan:   Post-operative Plan: Extubation in OR  Informed Consent: I have reviewed the patients History and Physical, chart, labs and discussed the procedure including the risks, benefits and alternatives for the proposed anesthesia with the patient or authorized representative who has indicated his/her understanding and acceptance.   Dental Advisory Given  Plan Discussed with: CRNA and Surgeon  Anesthesia Plan Comments:         Anesthesia Quick Evaluation

## 2017-01-19 NOTE — Anesthesia Procedure Notes (Signed)
Procedure Name: Intubation Date/Time: 01/19/2017 12:23 PM Performed by: Shirlyn Goltz Pre-anesthesia Checklist: Patient identified, Emergency Drugs available, Suction available, Patient being monitored and Timeout performed Patient Re-evaluated:Patient Re-evaluated prior to inductionOxygen Delivery Method: Circle system utilized Preoxygenation: Pre-oxygenation with 100% oxygen Intubation Type: IV induction Ventilation: Mask ventilation without difficulty Laryngoscope Size: Mac and 4 Grade View: Grade I Tube size: 8.0 mm Number of attempts: 1 Airway Equipment and Method: Stylet Placement Confirmation: ETT inserted through vocal cords under direct vision,  positive ETCO2,  CO2 detector and breath sounds checked- equal and bilateral Secured at: 24 cm Tube secured with: Tape Dental Injury: Teeth and Oropharynx as per pre-operative assessment

## 2017-01-19 NOTE — Progress Notes (Signed)
Dr. Tobias Alexander made aware of I stat glucose of 298, order to give novolog SQ.

## 2017-01-19 NOTE — Anesthesia Postprocedure Evaluation (Addendum)
Anesthesia Post Note  Patient: [name]  Procedure(s) Performed: Procedure(s) (LRB): PLICATION REPAIR OF ARTERIOVENOUS FISTULA PSEUDOANEURYSM (Right)  Patient location during evaluation: PACU Anesthesia Type: General Level of consciousness: sedated Pain management: pain level controlled Vital Signs Assessment: post-procedure vital signs reviewed and stable Respiratory status: spontaneous breathing and respiratory function stable Cardiovascular status: stable Anesthetic complications: no       Last Vitals:  Vitals:   01/19/17 1400 01/19/17 1415  BP: 107/66 112/67  Pulse: 81 79  Resp: 14 20  Temp:  36.6 C    Last Pain:  Vitals:   01/19/17 1415  TempSrc:   PainSc: 3                  Carrick Rijos DANIEL

## 2017-01-19 NOTE — H&P (View-Only) (Signed)
[facility]  Patient name: [name] MRN: [id number] DOB: 1956-06-20 Sex: male   REASON FOR VISIT:    Fistula ulcer  HISOTRY OF PRESENT ILLNESS:    [name] is a 61 y.o. male who comes in today for evaluation of a ulcer on his right radiocephalic fistula which was created several years ago.  He did undergo branch ligation in order for his fistula to mature.  The patient currently is on dialysis Monday Wednesday Friday.  He is medically managed for hypercholesterolemia.  He takes a statin for hypercholesterolemia.  He is on multiple medications for diabetes.   PAST MEDICAL HISTORY:   Past Medical History:  Diagnosis Date  . Alcohol abuse   . Congestive heart disease ([place]) 12/2011  . Diabetes mellitus   . Gout   . Hyperlipidemia   . Hyperlipidemia   . Hypertension   . Monoclonal gammopathies 02/25/2012  . Pedal edema   . Renal insufficiency   . Tobacco abuse      FAMILY HISTORY:   Family History  Problem Relation Age of Onset  . Diabetes Mother   . Heart disease Mother   . Lung cancer Father     SOCIAL HISTORY:   Social History  Substance Use Topics  . Smoking status: Former Smoker    Packs/day: 0.30    Years: 33.00    Types: Cigarettes    Quit date: 07/16/2010  . Smokeless tobacco: Never Used  . Alcohol use No     ALLERGIES:   Allergies  Allergen Reactions  . Penicillins Nausea And Vomiting and Other (See Comments)    Stomach pains also  . Vioxx [Rofecoxib] Nausea Only and Other (See Comments)    Gi upset.     CURRENT MEDICATIONS:   Current Outpatient Prescriptions  Medication Sig Dispense Refill  . allopurinol (ZYLOPRIM) 100 MG tablet Take 100 mg by mouth 2 (two) times daily as needed.     Marland Kitchen amLODipine (NORVASC) 10 MG tablet Take 10 mg by mouth at bedtime.    Marland Kitchen amLODipine (NORVASC) 5 MG tablet Take 5 mg by mouth.   5  . aspirin EC 81 MG tablet Take 81 mg by mouth  daily.    . calcium acetate, Phos Binder, (PHOSLYRA) 667 MG/5ML SOLN Take 667 mg by mouth 3 (three) times daily with meals. Takes 4 capsules with each meal (3 times daily).    . carvedilol (COREG) 6.25 MG tablet Take 1 tablet (6.25 mg total) by mouth 2 (two) times daily with a meal. 60 tablet 0  . cinacalcet (SENSIPAR) 30 MG tablet Take 30 mg by mouth daily.    Marland Kitchen COLCRYS 0.6 MG tablet TK 1 C PO QD PRF GOUT  5  . gabapentin (NEURONTIN) 100 MG capsule TK 1 TO 3 CS PO ONCE D FOR ITCHING  12  . indomethacin (INDOCIN) 50 MG capsule Take 50 mg by mouth 2 (two) times daily as needed (gout flair up).     Marland Kitchen lanthanum (FOSRENOL) 1000 MG chewable tablet Chew 1,000 mg by mouth 2 (two) times daily with a meal.    . latanoprost (XALATAN) 0.005 % ophthalmic solution Place 1 drop into both eyes at bedtime.  12  . LEVEMIR FLEXTOUCH 100 UNIT/ML Pen INJ 30 UNITS  HS  6  . loratadine (CLARITIN) 10 MG tablet TAKE 1 TABLET EVERY DAY 30 tablet 1  . NOVOLOG FLEXPEN 100 UNIT/ML FlexPen 15 Units. Injects 15 units before each meal (  3 times daily),  6  . polyvinyl alcohol-povidone (REFRESH) 1.4-0.6 % ophthalmic solution 1-2 drops as needed.    . pravastatin (PRAVACHOL) 40 MG tablet Take 40 mg by mouth daily.     No current facility-administered medications for this visit.     REVIEW OF SYSTEMS:   [X]  denotes positive finding, [ ]  denotes negative finding Cardiac  Comments:  Chest pain or chest pressure:    Shortness of breath upon exertion:    Short of breath when lying flat:    Irregular heart rhythm:        Vascular    Pain in calf, thigh, or hip brought on by ambulation:    Pain in feet at night that wakes you up from your sleep:     Blood clot in your veins:    Leg swelling:         Pulmonary    Oxygen at home:    Productive cough:     Wheezing:         Neurologic    Sudden weakness in arms or legs:     Sudden numbness in arms or legs:     Sudden onset of difficulty speaking or slurred speech:      Temporary loss of vision in one eye:     Problems with dizziness:         Gastrointestinal    Blood in stool:     Vomited blood:         Genitourinary    Burning when urinating:     Blood in urine:        Psychiatric    Major depression:         Hematologic    Bleeding problems:    Problems with blood clotting too easily:        Skin    Rashes or ulcers:        Constitutional    Fever or chills:      PHYSICAL EXAM:   Vitals:   01/17/17 1515  BP: 107/73  Pulse: 82  Resp: (!) 24  Temp: 98 F (36.7 C)  TempSrc: Oral  SpO2: 92%  Weight: (!) 343 lb (155.6 kg)  Height: 6\' 1"  (1.854 m)    GENERAL: The patient is a well-nourished male, in no acute distress. The vital signs are documented above. CARDIAC: There is a regular rate and rhythm.  VASCULAR: Palpable thrill within right radiocephalic fistula ulcer.  There are 2 aneurysmal areas the more distal one near the arterial venous anastomosis has a thinned out area with a eschar over top of this. PULMONARY: Non-labored respirations MUSCULOSKELETAL: There are no major deformities or cyanosis. NEUROLOGIC: No focal weakness or paresthesias are detected. SKIN: There are no ulcers or rashes noted. PSYCHIATRIC: The patient has a normal affect.  STUDIES:   I have reviewed the images from [facility] which did not show any evidence of stenosis within the fistula in the arm.  Central venous images were taken, however they were difficult to interpret.  I did not see any obvious central venous stenosis  MEDICAL ISSUES:   Ulcerated area on right radiocephalic fistula overlying a small pseudoaneurysm: I discussed with the patient that this needs to be repaired.  More than likely I will be able to resect the area of skin overlying the fistula and then perform plication of the aneurysm followed by closure.  This is going to be scheduled for Wednesday, March 7    Annamarie Major, MD  [facility] of  [facility] [phone] Pager [phone]

## 2017-01-19 NOTE — Op Note (Signed)
    Patient name: [name] MRN: [id number] DOB: 1956/01/03 Sex: male  01/19/2017 Pre-operative Diagnosis: Ulcerated right radiocephalic fistula aneurysm Post-operative diagnosis:  Same Surgeon:  Annamarie Major Assistants:  Gerri Lins Procedure:   #1: Plication of right radiocephalic fistula aneurysm   #2: Resection of ulcer on right radiocephalic fistula Anesthesia:  Gen. Blood Loss:  See anesthesia record Specimens:  None  Findings:  Large aneurysm underlying the skin ulcer.  This was completely excised and plication of the aneurysm was performed  Indications:  The patient presented to the office with a large pseudoaneurysm over his right radiocephalic fistula with a eschar over top of the aneurysm.  According to the patient this is been there for approximately one month.  He therefore comes in today for repair  Procedure:  The patient was identified in the holding area and taken to [facility]  The patient was then placed supine on the table. general anesthesia was administered.  The patient was prepped and draped in the usual sterile fashion.  A time out was called and antibiotics were administered.  An elliptical incision was made around the ulcer and underlying pseudoaneurysm.  I dissected out the fistula proximal and distal to the pseudoaneurysm and then fully mobilized pseudoaneurysm.  Once I had adequate exposure, 3000 units of heparin was given.  The fistula was then occluded.  A #11 blade was used to open the fistula and then I used Metzenbaum scissors to resect the overlying skin and approximately 50% of the aneurysmal vein.  I then used a 5-0 Prolene in running fashion to close the fistula.  Before completion, the fistula was appropriately flushed.  Both clamps were released and there was good hemostasis.  25 mg of protamine were administered.  There was excellent thrill within the fistula and also a adequate Doppler signal which was fistula dependent.  Once I was satisfied  with hemostasis, the incision was closed with 2 layers of 3-0 Vicryl followed by Dermabond.   Disposition:  To PACU stable   V. Annamarie Major, M.D. [facility] [place] Office: [phone] Pager:  [phone]

## 2017-01-19 NOTE — OR Nursing (Signed)
Pt arrived in Phase 2 for D/c home, no D/C instructions in chart. Called Dr. Trula Slade who said that Gwenette Greet was putting them in

## 2017-01-20 ENCOUNTER — Encounter (HOSPITAL_COMMUNITY): Payer: Self-pay | Admitting: Surgery

## 2017-01-21 DIAGNOSIS — N2581 Secondary hyperparathyroidism of renal origin: Secondary | ICD-10-CM | POA: Diagnosis not present

## 2017-01-21 DIAGNOSIS — E119 Type 2 diabetes mellitus without complications: Secondary | ICD-10-CM | POA: Diagnosis not present

## 2017-01-21 DIAGNOSIS — N186 End stage renal disease: Secondary | ICD-10-CM | POA: Diagnosis not present

## 2017-01-24 ENCOUNTER — Encounter: Payer: Self-pay | Admitting: Vascular Surgery

## 2017-01-24 ENCOUNTER — Ambulatory Visit (INDEPENDENT_AMBULATORY_CARE_PROVIDER_SITE_OTHER): Payer: Self-pay | Admitting: Vascular Surgery

## 2017-01-24 ENCOUNTER — Telehealth: Payer: Self-pay

## 2017-01-24 ENCOUNTER — Encounter: Payer: Self-pay | Admitting: Surgery

## 2017-01-24 VITALS — BP 132/79 | HR 86 | Temp 97.4°F | Resp 16 | Ht 73.0 in | Wt 350.0 lb

## 2017-01-24 DIAGNOSIS — Z992 Dependence on renal dialysis: Secondary | ICD-10-CM

## 2017-01-24 DIAGNOSIS — N186 End stage renal disease: Secondary | ICD-10-CM

## 2017-01-24 DIAGNOSIS — E119 Type 2 diabetes mellitus without complications: Secondary | ICD-10-CM | POA: Diagnosis not present

## 2017-01-24 DIAGNOSIS — N2581 Secondary hyperparathyroidism of renal origin: Secondary | ICD-10-CM | POA: Diagnosis not present

## 2017-01-24 NOTE — Progress Notes (Signed)
Patient name: [name] MRN: [id number] DOB: 18-Apr-1956 Sex: male  REASON FOR VISIT: add-on  HPI: [name] is a 61 y.o. male who presents as an add-on today due to redness and pain with his right radiocephalic AV fistula. The patient recently underwent plication of right radiocephalic fistula aneurysm on 01/19/17. His wife reports some minimal clear drainage from the incision. He denies any fever or chills. He denies any pain in his right hand. He denies any issues with cannulating his fistula away from incision.   Current Outpatient Prescriptions  Medication Sig Dispense Refill  . acetaminophen (TYLENOL) 500 MG tablet Take 1,000 mg by mouth every 6 (six) hours as needed for moderate pain or headache.    . allopurinol (ZYLOPRIM) 100 MG tablet Take 100 mg by mouth 2 (two) times daily.     Marland Kitchen amLODipine (NORVASC) 10 MG tablet Take 10 mg by mouth daily.     Marland Kitchen aspirin EC 81 MG tablet Take 81 mg by mouth daily.    . carvedilol (COREG) 6.25 MG tablet Take 1 tablet (6.25 mg total) by mouth 2 (two) times daily with a meal. 60 tablet 0  . cinacalcet (SENSIPAR) 60 MG tablet Take 60 mg by mouth daily.    . ferric citrate (AURYXIA) 1 GM 210 MG(Fe) tablet Take 210 mg by mouth 3 (three) times daily with meals.    . furosemide (LASIX) 40 MG tablet Take 40 mg by mouth daily.    Marland Kitchen gabapentin (NEURONTIN) 100 MG capsule Take 200mg  twice daily  12  . indomethacin (INDOCIN) 50 MG capsule Take 50 mg by mouth daily.     Marland Kitchen LEVEMIR FLEXTOUCH 100 UNIT/ML Pen Inject 30 units at bedtime  6  . loratadine (CLARITIN) 10 MG tablet TAKE 1 TABLET EVERY DAY (Patient taking differently: TAKE 1 TABLET EVERY DAY AS NEEDED FOR ALLERGIES) 30 tablet 1  . MAVYRET 100-40 MG TABS Take 3 tablets by mouth daily.  1  . NOVOLOG FLEXPEN 100 UNIT/ML FlexPen Inject 20 Units into the skin 3 (three) times daily with meals.   6  . oxyCODONE-acetaminophen (PERCOCET/ROXICET) 5-325 MG tablet Take 1 tablet by mouth every 6 (six) hours as  needed. 15 tablet 0  . pravastatin (PRAVACHOL) 40 MG tablet Take 20 mg by mouth daily.      No current facility-administered medications for this visit.     REVIEW OF SYSTEMS:  [X]  denotes positive finding, [ ]  denotes negative finding Cardiac  Comments:  Chest pain or chest pressure:    Shortness of breath upon exertion:    Short of breath when lying flat:    Irregular heart rhythm:    Constitutional    Fever or chills:      PHYSICAL EXAM: Vitals:   01/24/17 1344  BP: 132/79  Pulse: 86  Resp: 16  Temp: 97.4 F (36.3 C)  TempSrc: Oral  SpO2: 95%  Weight: (!) 350 lb (158.8 kg)  Height: 6\' 1"  (1.854 m)    GENERAL: The patient is a well-nourished male, in no acute distress. The vital signs are documented above. VASCULAR: Right wrist incision with minimal serous drainage. Incision is intact. No erythema or fluctuance. Small aneurysmal area distal to incision. No skin thinning or ulceration.   MEDICAL ISSUES: S/p plication of right radiocephalic AV fistula 01/14/34  Incision is healing well. No signs of infection. Advised continued cannulation away from incision. Will have patient follow-up in 3 weeks to evaluate incision and see if this area  can be cannulated.   Virgina Jock, PA-C [facility] MD: Trula Slade

## 2017-01-24 NOTE — Telephone Encounter (Signed)
Pt's wife called to report the right arm incision is opened approx. size of 1/2 of penny.  Stated since Friday, he has c/o increased pain and has noticed increased redness @ incision.  Denied fever/ chills.  Appt. given for today, at 2:00 PM with PA.  Wife agreed with plan.

## 2017-01-26 DIAGNOSIS — E119 Type 2 diabetes mellitus without complications: Secondary | ICD-10-CM | POA: Diagnosis not present

## 2017-01-26 DIAGNOSIS — N2581 Secondary hyperparathyroidism of renal origin: Secondary | ICD-10-CM | POA: Diagnosis not present

## 2017-01-26 DIAGNOSIS — N186 End stage renal disease: Secondary | ICD-10-CM | POA: Diagnosis not present

## 2017-01-28 DIAGNOSIS — N186 End stage renal disease: Secondary | ICD-10-CM | POA: Diagnosis not present

## 2017-01-28 DIAGNOSIS — E119 Type 2 diabetes mellitus without complications: Secondary | ICD-10-CM | POA: Diagnosis not present

## 2017-01-28 DIAGNOSIS — N2581 Secondary hyperparathyroidism of renal origin: Secondary | ICD-10-CM | POA: Diagnosis not present

## 2017-01-31 DIAGNOSIS — E119 Type 2 diabetes mellitus without complications: Secondary | ICD-10-CM | POA: Diagnosis not present

## 2017-01-31 DIAGNOSIS — N2581 Secondary hyperparathyroidism of renal origin: Secondary | ICD-10-CM | POA: Diagnosis not present

## 2017-01-31 DIAGNOSIS — N186 End stage renal disease: Secondary | ICD-10-CM | POA: Diagnosis not present

## 2017-02-02 DIAGNOSIS — N186 End stage renal disease: Secondary | ICD-10-CM | POA: Diagnosis not present

## 2017-02-02 DIAGNOSIS — E119 Type 2 diabetes mellitus without complications: Secondary | ICD-10-CM | POA: Diagnosis not present

## 2017-02-02 DIAGNOSIS — N2581 Secondary hyperparathyroidism of renal origin: Secondary | ICD-10-CM | POA: Diagnosis not present

## 2017-02-03 ENCOUNTER — Ambulatory Visit (INDEPENDENT_AMBULATORY_CARE_PROVIDER_SITE_OTHER): Payer: Self-pay | Admitting: Physician Assistant

## 2017-02-03 ENCOUNTER — Encounter: Payer: Self-pay | Admitting: Vascular Surgery

## 2017-02-03 ENCOUNTER — Telehealth: Payer: Self-pay

## 2017-02-03 VITALS — BP 127/80 | HR 80 | Temp 97.3°F | Resp 20 | Ht 72.0 in | Wt 340.0 lb

## 2017-02-03 DIAGNOSIS — N186 End stage renal disease: Secondary | ICD-10-CM

## 2017-02-03 DIAGNOSIS — Z992 Dependence on renal dialysis: Secondary | ICD-10-CM

## 2017-02-03 NOTE — Telephone Encounter (Signed)
Phone call from pt's wife.  Reported that "a stitch has opened up on right arm incision, approx. size of tip of finger."  Reported there has been small amt. of blood oozing from the opening.  Reported pt. c/o increased pain.  Is requesting an appt. today to check the incision.  Appt. given @ 2:30 PM with the PA.  Wife agrees with plan.

## 2017-02-03 NOTE — Progress Notes (Signed)
POST OPERATIVE OFFICE NOTE    CC:  F/u for surgery  HPI:  This is a 61 y.o. male who is s/p  Procedure:   #1: Plication of right radiocephalic fistula aneurysm                         #2: Resection of ulcer on right radiocephalic fistula Incisional wound.  He is currently in HD using the fistula above the incision without difficulty.  He reports no fever or chills.  No drainage at wound site.    Allergies  Allergen Reactions  . Penicillins Nausea And Vomiting and Other (See Comments)    STOMACH PAIN  Has patient had a PCN reaction causing immediate rash, facial/tongue/throat swelling, SOB or lightheadedness with hypotension: No Has patient had a PCN reaction causing severe rash involving mucus membranes or skin necrosis: No Has patient had a PCN reaction that required hospitalization No Has patient had a PCN reaction occurring within the last 10 years: No If all of the above answers are "NO", then may proceed with Cephalosporin use.   . Vioxx [Rofecoxib] Nausea Only and Other (See Comments)    Gi upset.    Current Outpatient Prescriptions  Medication Sig Dispense Refill  . acetaminophen (TYLENOL) 500 MG tablet Take 1,000 mg by mouth every 6 (six) hours as needed for moderate pain or headache.    . allopurinol (ZYLOPRIM) 100 MG tablet Take 100 mg by mouth 2 (two) times daily.     Marland Kitchen amLODipine (NORVASC) 10 MG tablet Take 10 mg by mouth daily.     Marland Kitchen aspirin EC 81 MG tablet Take 81 mg by mouth daily.    . carvedilol (COREG) 6.25 MG tablet Take 1 tablet (6.25 mg total) by mouth 2 (two) times daily with a meal. 60 tablet 0  . cinacalcet (SENSIPAR) 60 MG tablet Take 60 mg by mouth daily.    . ferric citrate (AURYXIA) 1 GM 210 MG(Fe) tablet Take 210 mg by mouth 3 (three) times daily with meals.    . furosemide (LASIX) 40 MG tablet Take 40 mg by mouth daily.    Marland Kitchen gabapentin (NEURONTIN) 100 MG capsule Take 200mg  twice daily  12  . indomethacin (INDOCIN) 50 MG capsule Take 50 mg by mouth  daily.     Marland Kitchen LEVEMIR FLEXTOUCH 100 UNIT/ML Pen Inject 30 units at bedtime  6  . loratadine (CLARITIN) 10 MG tablet TAKE 1 TABLET EVERY DAY (Patient taking differently: TAKE 1 TABLET EVERY DAY AS NEEDED FOR ALLERGIES) 30 tablet 1  . MAVYRET 100-40 MG TABS Take 3 tablets by mouth daily.  1  . NOVOLOG FLEXPEN 100 UNIT/ML FlexPen Inject 20 Units into the skin 3 (three) times daily with meals.   6  . pravastatin (PRAVACHOL) 40 MG tablet Take 20 mg by mouth daily.     Marland Kitchen oxyCODONE-acetaminophen (PERCOCET/ROXICET) 5-325 MG tablet Take 1 tablet by mouth every 6 (six) hours as needed. (Patient not taking: Reported on 02/03/2017) 15 tablet 0   No current facility-administered medications for this visit.      ROS:  See HPI  Physical Exam:  Vitals:   02/03/17 1425  BP: 127/80  Pulse: 80  Resp: 20  Temp: 97.3 F (36.3 C)    Incision:  Right UE distal arm at incision superficial opening.  No visible fistula, no erythema, or drainage. Extremities:  Grip 5/5, numbness 4th and 5th digits.  No muscle waisting.     Assessment/Plan:  This is a 61 y.o. male who is s/p: Procedure:   #1: Plication of right radiocephalic fistula aneurysm                         #2: Resection of ulcer on right radiocephalic fistula  Dehiscence of incision superficial.  We started wet to dry saline dressings today.  He will do this at home daily.  I'll schedule him to come back in 2 weeks for a wound check.  Do not stick at incision site until completely healed.        Laurence Slate [facility] PA-C [facility] [phone]  Clinic MD:  Oneida Alar

## 2017-02-04 ENCOUNTER — Encounter: Payer: Self-pay | Admitting: Surgery

## 2017-02-04 DIAGNOSIS — N186 End stage renal disease: Secondary | ICD-10-CM | POA: Diagnosis not present

## 2017-02-04 DIAGNOSIS — E119 Type 2 diabetes mellitus without complications: Secondary | ICD-10-CM | POA: Diagnosis not present

## 2017-02-04 DIAGNOSIS — N2581 Secondary hyperparathyroidism of renal origin: Secondary | ICD-10-CM | POA: Diagnosis not present

## 2017-02-07 DIAGNOSIS — N2581 Secondary hyperparathyroidism of renal origin: Secondary | ICD-10-CM | POA: Diagnosis not present

## 2017-02-07 DIAGNOSIS — N186 End stage renal disease: Secondary | ICD-10-CM | POA: Diagnosis not present

## 2017-02-07 DIAGNOSIS — E119 Type 2 diabetes mellitus without complications: Secondary | ICD-10-CM | POA: Diagnosis not present

## 2017-02-09 DIAGNOSIS — E119 Type 2 diabetes mellitus without complications: Secondary | ICD-10-CM | POA: Diagnosis not present

## 2017-02-09 DIAGNOSIS — N186 End stage renal disease: Secondary | ICD-10-CM | POA: Diagnosis not present

## 2017-02-09 DIAGNOSIS — N2581 Secondary hyperparathyroidism of renal origin: Secondary | ICD-10-CM | POA: Diagnosis not present

## 2017-02-11 DIAGNOSIS — N186 End stage renal disease: Secondary | ICD-10-CM | POA: Diagnosis not present

## 2017-02-11 DIAGNOSIS — N2581 Secondary hyperparathyroidism of renal origin: Secondary | ICD-10-CM | POA: Diagnosis not present

## 2017-02-11 DIAGNOSIS — E119 Type 2 diabetes mellitus without complications: Secondary | ICD-10-CM | POA: Diagnosis not present

## 2017-02-12 DIAGNOSIS — N186 End stage renal disease: Secondary | ICD-10-CM | POA: Diagnosis not present

## 2017-02-12 DIAGNOSIS — Z992 Dependence on renal dialysis: Secondary | ICD-10-CM | POA: Diagnosis not present

## 2017-02-12 DIAGNOSIS — E1129 Type 2 diabetes mellitus with other diabetic kidney complication: Secondary | ICD-10-CM | POA: Diagnosis not present

## 2017-02-14 ENCOUNTER — Encounter: Payer: Self-pay | Admitting: Physician Assistant

## 2017-02-14 ENCOUNTER — Ambulatory Visit (INDEPENDENT_AMBULATORY_CARE_PROVIDER_SITE_OTHER): Payer: Self-pay | Admitting: Physician Assistant

## 2017-02-14 VITALS — BP 111/72 | HR 94 | Temp 97.7°F | Resp 16 | Ht 72.0 in

## 2017-02-14 DIAGNOSIS — N2581 Secondary hyperparathyroidism of renal origin: Secondary | ICD-10-CM | POA: Diagnosis not present

## 2017-02-14 DIAGNOSIS — Z992 Dependence on renal dialysis: Secondary | ICD-10-CM

## 2017-02-14 DIAGNOSIS — N186 End stage renal disease: Secondary | ICD-10-CM | POA: Diagnosis not present

## 2017-02-14 DIAGNOSIS — D509 Iron deficiency anemia, unspecified: Secondary | ICD-10-CM | POA: Diagnosis not present

## 2017-02-14 NOTE — Progress Notes (Signed)
POST OPERATIVE OFFICE NOTE    CC:  F/u for surgery  HPI:  This is a 61 y.o. male who is s/p who underwent plication of right radial cephalic AVF aneurysm and resection of ulcer on right radial cephalic AVF by Dr. Trula Slade on 01/19/17.  He presented a couple of weeks ago with a superficial dehiscence of his incision.  Wet to dry saline dressing changes were started for daily.  Pt states the wound is improving and getting smaller.  He states that he does get some tingling in his fingers.  Denies any motor or sensation changes.   He presents today for reevaluation.   Allergies  Allergen Reactions  . Penicillins Nausea And Vomiting and Other (See Comments)    STOMACH PAIN  Has patient had a PCN reaction causing immediate rash, facial/tongue/throat swelling, SOB or lightheadedness with hypotension: No Has patient had a PCN reaction causing severe rash involving mucus membranes or skin necrosis: No Has patient had a PCN reaction that required hospitalization No Has patient had a PCN reaction occurring within the last 10 years: No If all of the above answers are "NO", then may proceed with Cephalosporin use.   . Vioxx [Rofecoxib] Nausea Only and Other (See Comments)    Gi upset.    Current Outpatient Prescriptions  Medication Sig Dispense Refill  . acetaminophen (TYLENOL) 500 MG tablet Take 1,000 mg by mouth every 6 (six) hours as needed for moderate pain or headache.    . allopurinol (ZYLOPRIM) 100 MG tablet Take 100 mg by mouth 2 (two) times daily.     Marland Kitchen amLODipine (NORVASC) 10 MG tablet Take 10 mg by mouth daily.     Marland Kitchen aspirin EC 81 MG tablet Take 81 mg by mouth daily.    . carvedilol (COREG) 6.25 MG tablet Take 1 tablet (6.25 mg total) by mouth 2 (two) times daily with a meal. 60 tablet 0  . cinacalcet (SENSIPAR) 60 MG tablet Take 60 mg by mouth daily.    . ferric citrate (AURYXIA) 1 GM 210 MG(Fe) tablet Take 210 mg by mouth 3 (three) times daily with meals.    . furosemide (LASIX) 40 MG  tablet Take 40 mg by mouth daily.    Marland Kitchen gabapentin (NEURONTIN) 100 MG capsule Take 200mg  twice daily  12  . indomethacin (INDOCIN) 50 MG capsule Take 50 mg by mouth daily.     Marland Kitchen LEVEMIR FLEXTOUCH 100 UNIT/ML Pen Inject 30 units at bedtime  6  . loratadine (CLARITIN) 10 MG tablet TAKE 1 TABLET EVERY DAY (Patient taking differently: TAKE 1 TABLET EVERY DAY AS NEEDED FOR ALLERGIES) 30 tablet 1  . MAVYRET 100-40 MG TABS Take 3 tablets by mouth daily.  1  . NOVOLOG FLEXPEN 100 UNIT/ML FlexPen Inject 20 Units into the skin 3 (three) times daily with meals.   6  . pravastatin (PRAVACHOL) 40 MG tablet Take 20 mg by mouth daily.     Marland Kitchen oxyCODONE-acetaminophen (PERCOCET/ROXICET) 5-325 MG tablet Take 1 tablet by mouth every 6 (six) hours as needed. (Patient not taking: Reported on 02/14/2017) 15 tablet 0   No current facility-administered medications for this visit.      ROS:  See HPI  Physical Exam:  Vitals:   02/14/17 1337  BP: 111/72  Pulse: 94  Resp: 16  Temp: 97.7 F (36.5 C)    Incision:  2-4mm wide dehiscence of incision.  There is good granulation tissue present in the wound. No drainage.   Extremities:  Good  right hand grip; easily palpable right radial pulse.  Sensation and motor are in tact.  Fingertips are warm.  Fistula with +thrill.   Assessment/Plan:  This is a 61 y.o. male who is s/p: Plication of right RC AVF and resection of ulcer on the right RC AVF  -pt doing well and wound is clean and healing. -pt is doing dressing changes once a day.  I have asked him to change this to twice a day since the gauze is dry when he removes it.   -he has inquired about swimming at the [facility] for exercise.  He is not to swim until this wound has completely healed. -he will f/u in 2 weeks with the PA on a day that Dr. Trula Slade is in the office for a wound check.   Leontine Locket, PA-C [facility] [phone]  Clinic MD:  none

## 2017-02-15 DIAGNOSIS — B351 Tinea unguium: Secondary | ICD-10-CM | POA: Diagnosis not present

## 2017-02-15 DIAGNOSIS — R600 Localized edema: Secondary | ICD-10-CM | POA: Diagnosis not present

## 2017-02-15 DIAGNOSIS — E782 Mixed hyperlipidemia: Secondary | ICD-10-CM | POA: Diagnosis not present

## 2017-02-15 DIAGNOSIS — I509 Heart failure, unspecified: Secondary | ICD-10-CM | POA: Diagnosis not present

## 2017-02-15 DIAGNOSIS — I10 Essential (primary) hypertension: Secondary | ICD-10-CM | POA: Diagnosis not present

## 2017-02-15 DIAGNOSIS — D472 Monoclonal gammopathy: Secondary | ICD-10-CM | POA: Diagnosis not present

## 2017-02-15 DIAGNOSIS — G4730 Sleep apnea, unspecified: Secondary | ICD-10-CM | POA: Diagnosis not present

## 2017-02-15 DIAGNOSIS — E1122 Type 2 diabetes mellitus with diabetic chronic kidney disease: Secondary | ICD-10-CM | POA: Diagnosis not present

## 2017-02-15 DIAGNOSIS — M109 Gout, unspecified: Secondary | ICD-10-CM | POA: Diagnosis not present

## 2017-02-15 DIAGNOSIS — N186 End stage renal disease: Secondary | ICD-10-CM | POA: Diagnosis not present

## 2017-02-15 DIAGNOSIS — E0842 Diabetes mellitus due to underlying condition with diabetic polyneuropathy: Secondary | ICD-10-CM | POA: Diagnosis not present

## 2017-02-15 DIAGNOSIS — Z794 Long term (current) use of insulin: Secondary | ICD-10-CM | POA: Diagnosis not present

## 2017-02-16 DIAGNOSIS — N186 End stage renal disease: Secondary | ICD-10-CM | POA: Diagnosis not present

## 2017-02-16 DIAGNOSIS — N2581 Secondary hyperparathyroidism of renal origin: Secondary | ICD-10-CM | POA: Diagnosis not present

## 2017-02-16 DIAGNOSIS — D509 Iron deficiency anemia, unspecified: Secondary | ICD-10-CM | POA: Diagnosis not present

## 2017-02-17 ENCOUNTER — Encounter: Payer: Self-pay | Admitting: Surgery

## 2017-02-18 DIAGNOSIS — D509 Iron deficiency anemia, unspecified: Secondary | ICD-10-CM | POA: Diagnosis not present

## 2017-02-18 DIAGNOSIS — N2581 Secondary hyperparathyroidism of renal origin: Secondary | ICD-10-CM | POA: Diagnosis not present

## 2017-02-18 DIAGNOSIS — N186 End stage renal disease: Secondary | ICD-10-CM | POA: Diagnosis not present

## 2017-02-21 DIAGNOSIS — N186 End stage renal disease: Secondary | ICD-10-CM | POA: Diagnosis not present

## 2017-02-21 DIAGNOSIS — D509 Iron deficiency anemia, unspecified: Secondary | ICD-10-CM | POA: Diagnosis not present

## 2017-02-21 DIAGNOSIS — N2581 Secondary hyperparathyroidism of renal origin: Secondary | ICD-10-CM | POA: Diagnosis not present

## 2017-02-22 ENCOUNTER — Encounter: Payer: Self-pay | Admitting: Podiatry

## 2017-02-22 ENCOUNTER — Ambulatory Visit: Payer: Medicare Other | Admitting: Podiatry

## 2017-02-22 DIAGNOSIS — M79674 Pain in right toe(s): Secondary | ICD-10-CM

## 2017-02-22 DIAGNOSIS — E1142 Type 2 diabetes mellitus with diabetic polyneuropathy: Secondary | ICD-10-CM

## 2017-02-22 DIAGNOSIS — B351 Tinea unguium: Secondary | ICD-10-CM

## 2017-02-22 DIAGNOSIS — M79675 Pain in left toe(s): Secondary | ICD-10-CM

## 2017-02-22 NOTE — Progress Notes (Signed)
Patient ID: [name], male   DOB: [birth year], 61 y.o.   MRN: [id number]   Subjective: This patient presents for scheduled visit complaining of uncomfortable toenails walking wearing shoes and requests toenail debridement. He has also scheduled for diabetic shoe measurements  Objective:  Orientated 3  Vascular: Mild lower extremity edema with mild calf tenderness left Mild lower extremity edema right DP pulses 1/4 bilaterally PT pulses 1/4 bilaterally Capillary reflex within normal limits  Neurological: Sensation to 10 g monofilament wire intact 2/5 bilaterally Vibratory sensation nonreactive bilaterally Ankle reflexes equal and reactive bilaterally  Dermatological: No open skin lesions bilaterally Atrophic, shiny skin with absent hair growth bilaterally The toenails are hypertrophic, elongated, discolored, deformed and tender to direct palpation 6-10  Musculoskeletal: There is no restriction ankle, subtalar, midtarsal joints bilaterally Manual motor testing dorsi flexion, plantar flexion, inversion, eversion 5/5 bilaterally  Assessment: Peripheral arterial disease associated with decreased pulses Diabetic peripheral neuropathy associated with decreased sensation Edema left lower leg has been evaluated and a DVT has been ruled out,per patient Mycotic toenails 6-10  Plan: Debridement of toenails 6-10 mechanically and electrically without any bleeding Measurements obtain for diabetic shoes. Notify patient on receipt of diabetic shoes   Reappoint 3 months for nail debridement

## 2017-02-22 NOTE — Patient Instructions (Signed)

## 2017-02-23 DIAGNOSIS — N186 End stage renal disease: Secondary | ICD-10-CM | POA: Diagnosis not present

## 2017-02-23 DIAGNOSIS — D509 Iron deficiency anemia, unspecified: Secondary | ICD-10-CM | POA: Diagnosis not present

## 2017-02-23 DIAGNOSIS — N2581 Secondary hyperparathyroidism of renal origin: Secondary | ICD-10-CM | POA: Diagnosis not present

## 2017-02-25 DIAGNOSIS — N2581 Secondary hyperparathyroidism of renal origin: Secondary | ICD-10-CM | POA: Diagnosis not present

## 2017-02-25 DIAGNOSIS — D509 Iron deficiency anemia, unspecified: Secondary | ICD-10-CM | POA: Diagnosis not present

## 2017-02-25 DIAGNOSIS — N186 End stage renal disease: Secondary | ICD-10-CM | POA: Diagnosis not present

## 2017-02-28 ENCOUNTER — Ambulatory Visit: Payer: Medicare Other

## 2017-02-28 DIAGNOSIS — N186 End stage renal disease: Secondary | ICD-10-CM | POA: Diagnosis not present

## 2017-02-28 DIAGNOSIS — N2581 Secondary hyperparathyroidism of renal origin: Secondary | ICD-10-CM | POA: Diagnosis not present

## 2017-02-28 DIAGNOSIS — D509 Iron deficiency anemia, unspecified: Secondary | ICD-10-CM | POA: Diagnosis not present

## 2017-03-02 DIAGNOSIS — N186 End stage renal disease: Secondary | ICD-10-CM | POA: Diagnosis not present

## 2017-03-02 DIAGNOSIS — N2581 Secondary hyperparathyroidism of renal origin: Secondary | ICD-10-CM | POA: Diagnosis not present

## 2017-03-02 DIAGNOSIS — D509 Iron deficiency anemia, unspecified: Secondary | ICD-10-CM | POA: Diagnosis not present

## 2017-03-04 ENCOUNTER — Emergency Department (HOSPITAL_COMMUNITY): Payer: Medicare Other

## 2017-03-04 ENCOUNTER — Inpatient Hospital Stay (HOSPITAL_COMMUNITY)
Admission: EM | Admit: 2017-03-04 | Discharge: 2017-03-14 | DRG: 853 | Disposition: A | Discharge status: Home / Self Care | Payer: Medicare Other | Attending: Internal Medicine | Admitting: Internal Medicine

## 2017-03-04 ENCOUNTER — Encounter (HOSPITAL_COMMUNITY): Payer: Self-pay

## 2017-03-04 DIAGNOSIS — Z87891 Personal history of nicotine dependence: Secondary | ICD-10-CM | POA: Diagnosis not present

## 2017-03-04 DIAGNOSIS — I10 Essential (primary) hypertension: Secondary | ICD-10-CM | POA: Diagnosis present

## 2017-03-04 DIAGNOSIS — D509 Iron deficiency anemia, unspecified: Secondary | ICD-10-CM | POA: Diagnosis not present

## 2017-03-04 DIAGNOSIS — N186 End stage renal disease: Secondary | ICD-10-CM | POA: Diagnosis not present

## 2017-03-04 DIAGNOSIS — R6520 Severe sepsis without septic shock: Secondary | ICD-10-CM | POA: Diagnosis present

## 2017-03-04 DIAGNOSIS — Z9889 Other specified postprocedural states: Secondary | ICD-10-CM | POA: Diagnosis not present

## 2017-03-04 DIAGNOSIS — Z7982 Long term (current) use of aspirin: Secondary | ICD-10-CM

## 2017-03-04 DIAGNOSIS — A4150 Gram-negative sepsis, unspecified: Secondary | ICD-10-CM

## 2017-03-04 DIAGNOSIS — F1010 Alcohol abuse, uncomplicated: Secondary | ICD-10-CM | POA: Diagnosis not present

## 2017-03-04 DIAGNOSIS — M898X9 Other specified disorders of bone, unspecified site: Secondary | ICD-10-CM | POA: Diagnosis present

## 2017-03-04 DIAGNOSIS — R7881 Bacteremia: Secondary | ICD-10-CM | POA: Diagnosis not present

## 2017-03-04 DIAGNOSIS — R609 Edema, unspecified: Secondary | ICD-10-CM | POA: Diagnosis not present

## 2017-03-04 DIAGNOSIS — E872 Acidosis: Secondary | ICD-10-CM | POA: Diagnosis not present

## 2017-03-04 DIAGNOSIS — Z794 Long term (current) use of insulin: Secondary | ICD-10-CM

## 2017-03-04 DIAGNOSIS — E871 Hypo-osmolality and hyponatremia: Secondary | ICD-10-CM | POA: Diagnosis not present

## 2017-03-04 DIAGNOSIS — A4159 Other Gram-negative sepsis: Principal | ICD-10-CM | POA: Diagnosis present

## 2017-03-04 DIAGNOSIS — Z992 Dependence on renal dialysis: Secondary | ICD-10-CM

## 2017-03-04 DIAGNOSIS — G4733 Obstructive sleep apnea (adult) (pediatric): Secondary | ICD-10-CM | POA: Diagnosis present

## 2017-03-04 DIAGNOSIS — N492 Inflammatory disorders of scrotum: Secondary | ICD-10-CM | POA: Diagnosis not present

## 2017-03-04 DIAGNOSIS — B964 Proteus (mirabilis) (morganii) as the cause of diseases classified elsewhere: Secondary | ICD-10-CM | POA: Diagnosis present

## 2017-03-04 DIAGNOSIS — J9811 Atelectasis: Secondary | ICD-10-CM | POA: Diagnosis not present

## 2017-03-04 DIAGNOSIS — I509 Heart failure, unspecified: Secondary | ICD-10-CM | POA: Diagnosis present

## 2017-03-04 DIAGNOSIS — K429 Umbilical hernia without obstruction or gangrene: Secondary | ICD-10-CM | POA: Diagnosis not present

## 2017-03-04 DIAGNOSIS — I959 Hypotension, unspecified: Secondary | ICD-10-CM | POA: Diagnosis not present

## 2017-03-04 DIAGNOSIS — Z833 Family history of diabetes mellitus: Secondary | ICD-10-CM

## 2017-03-04 DIAGNOSIS — D472 Monoclonal gammopathy: Secondary | ICD-10-CM | POA: Diagnosis present

## 2017-03-04 DIAGNOSIS — I132 Hypertensive heart and chronic kidney disease with heart failure and with stage 5 chronic kidney disease, or end stage renal disease: Secondary | ICD-10-CM | POA: Diagnosis present

## 2017-03-04 DIAGNOSIS — Z880 Allergy status to penicillin: Secondary | ICD-10-CM

## 2017-03-04 DIAGNOSIS — Z888 Allergy status to other drugs, medicaments and biological substances status: Secondary | ICD-10-CM

## 2017-03-04 DIAGNOSIS — E785 Hyperlipidemia, unspecified: Secondary | ICD-10-CM | POA: Diagnosis not present

## 2017-03-04 DIAGNOSIS — E1129 Type 2 diabetes mellitus with other diabetic kidney complication: Secondary | ICD-10-CM | POA: Diagnosis not present

## 2017-03-04 DIAGNOSIS — J9601 Acute respiratory failure with hypoxia: Secondary | ICD-10-CM | POA: Diagnosis present

## 2017-03-04 DIAGNOSIS — K0889 Other specified disorders of teeth and supporting structures: Secondary | ICD-10-CM | POA: Diagnosis not present

## 2017-03-04 DIAGNOSIS — A419 Sepsis, unspecified organism: Secondary | ICD-10-CM | POA: Diagnosis not present

## 2017-03-04 DIAGNOSIS — R509 Fever, unspecified: Secondary | ICD-10-CM | POA: Diagnosis not present

## 2017-03-04 DIAGNOSIS — B1920 Unspecified viral hepatitis C without hepatic coma: Secondary | ICD-10-CM | POA: Diagnosis present

## 2017-03-04 DIAGNOSIS — R0600 Dyspnea, unspecified: Secondary | ICD-10-CM | POA: Diagnosis not present

## 2017-03-04 DIAGNOSIS — Z79899 Other long term (current) drug therapy: Secondary | ICD-10-CM

## 2017-03-04 DIAGNOSIS — M109 Gout, unspecified: Secondary | ICD-10-CM | POA: Diagnosis not present

## 2017-03-04 DIAGNOSIS — J9600 Acute respiratory failure, unspecified whether with hypoxia or hypercapnia: Secondary | ICD-10-CM

## 2017-03-04 DIAGNOSIS — N478 Other disorders of prepuce: Secondary | ICD-10-CM | POA: Diagnosis not present

## 2017-03-04 DIAGNOSIS — N4889 Other specified disorders of penis: Secondary | ICD-10-CM | POA: Diagnosis not present

## 2017-03-04 DIAGNOSIS — N2581 Secondary hyperparathyroidism of renal origin: Secondary | ICD-10-CM | POA: Diagnosis not present

## 2017-03-04 DIAGNOSIS — Z6841 Body Mass Index (BMI) 40.0 and over, adult: Secondary | ICD-10-CM

## 2017-03-04 DIAGNOSIS — R197 Diarrhea, unspecified: Secondary | ICD-10-CM | POA: Diagnosis present

## 2017-03-04 DIAGNOSIS — E0829 Diabetes mellitus due to underlying condition with other diabetic kidney complication: Secondary | ICD-10-CM | POA: Diagnosis not present

## 2017-03-04 DIAGNOSIS — N451 Epididymitis: Secondary | ICD-10-CM | POA: Diagnosis not present

## 2017-03-04 DIAGNOSIS — R69 Illness, unspecified: Secondary | ICD-10-CM

## 2017-03-04 DIAGNOSIS — E669 Obesity, unspecified: Secondary | ICD-10-CM | POA: Diagnosis present

## 2017-03-04 DIAGNOSIS — E119 Type 2 diabetes mellitus without complications: Secondary | ICD-10-CM

## 2017-03-04 DIAGNOSIS — E43 Unspecified severe protein-calorie malnutrition: Secondary | ICD-10-CM | POA: Diagnosis present

## 2017-03-04 DIAGNOSIS — N5089 Other specified disorders of the male genital organs: Secondary | ICD-10-CM

## 2017-03-04 DIAGNOSIS — N5082 Scrotal pain: Secondary | ICD-10-CM

## 2017-03-04 DIAGNOSIS — I120 Hypertensive chronic kidney disease with stage 5 chronic kidney disease or end stage renal disease: Secondary | ICD-10-CM | POA: Diagnosis not present

## 2017-03-04 DIAGNOSIS — D631 Anemia in chronic kidney disease: Secondary | ICD-10-CM | POA: Diagnosis not present

## 2017-03-04 DIAGNOSIS — Z9119 Patient's noncompliance with other medical treatment and regimen: Secondary | ICD-10-CM

## 2017-03-04 DIAGNOSIS — R0602 Shortness of breath: Secondary | ICD-10-CM | POA: Diagnosis not present

## 2017-03-04 DIAGNOSIS — E1122 Type 2 diabetes mellitus with diabetic chronic kidney disease: Secondary | ICD-10-CM | POA: Diagnosis present

## 2017-03-04 DIAGNOSIS — R0603 Acute respiratory distress: Secondary | ICD-10-CM

## 2017-03-04 DIAGNOSIS — Z8249 Family history of ischemic heart disease and other diseases of the circulatory system: Secondary | ICD-10-CM

## 2017-03-04 DIAGNOSIS — E1121 Type 2 diabetes mellitus with diabetic nephropathy: Secondary | ICD-10-CM | POA: Diagnosis not present

## 2017-03-04 DIAGNOSIS — Z886 Allergy status to analgesic agent status: Secondary | ICD-10-CM | POA: Diagnosis not present

## 2017-03-04 DIAGNOSIS — K760 Fatty (change of) liver, not elsewhere classified: Secondary | ICD-10-CM | POA: Diagnosis not present

## 2017-03-04 DIAGNOSIS — M1A9XX0 Chronic gout, unspecified, without tophus (tophi): Secondary | ICD-10-CM | POA: Diagnosis not present

## 2017-03-04 LAB — DIFFERENTIAL
Basophils Absolute: 0 10*3/uL (ref 0.0–0.1)
Basophils Relative: 0 %
EOS ABS: 0 10*3/uL (ref 0.0–0.7)
EOS PCT: 0 %
Lymphocytes Relative: 8 %
Lymphs Abs: 1.9 10*3/uL (ref 0.7–4.0)
MONO ABS: 2.6 10*3/uL — AB (ref 0.1–1.0)
Monocytes Relative: 11 %
Neutro Abs: 18.7 10*3/uL — ABNORMAL HIGH (ref 1.7–7.7)
Neutrophils Relative %: 81 %

## 2017-03-04 LAB — COMPREHENSIVE METABOLIC PANEL
ALBUMIN: 2.9 g/dL — AB (ref 3.5–5.0)
ALT: 24 U/L (ref 17–63)
AST: 49 U/L — AB (ref 15–41)
Alkaline Phosphatase: 125 U/L (ref 38–126)
Anion gap: 17 — ABNORMAL HIGH (ref 5–15)
BILIRUBIN TOTAL: 0.6 mg/dL (ref 0.3–1.2)
BUN: 44 mg/dL — AB (ref 6–20)
CHLORIDE: 89 mmol/L — AB (ref 101–111)
CO2: 26 mmol/L (ref 22–32)
CREATININE: 11.68 mg/dL — AB (ref 0.61–1.24)
Calcium: 8.5 mg/dL — ABNORMAL LOW (ref 8.9–10.3)
GFR calc Af Amer: 5 mL/min — ABNORMAL LOW (ref >60)
GFR, EST NON AFRICAN AMERICAN: 4 mL/min — AB (ref >60)
GLUCOSE: 322 mg/dL — AB (ref 65–99)
Potassium: 4.1 mmol/L (ref 3.5–5.1)
Sodium: 132 mmol/L — ABNORMAL LOW (ref 135–145)
Total Protein: 8.2 g/dL — ABNORMAL HIGH (ref 6.5–8.1)

## 2017-03-04 LAB — CBC
HCT: 34.8 % — ABNORMAL LOW (ref 39.0–52.0)
HEMATOCRIT: 33.8 % — AB (ref 39.0–52.0)
Hemoglobin: 11.2 g/dL — ABNORMAL LOW (ref 13.0–17.0)
Hemoglobin: 10.7 g/dL — ABNORMAL LOW (ref 13.0–17.0)
MCH: 25.2 pg — ABNORMAL LOW (ref 26.0–34.0)
MCH: 24.5 pg — AB (ref 26.0–34.0)
MCHC: 32.2 g/dL (ref 30.0–36.0)
MCHC: 31.7 g/dL (ref 30.0–36.0)
MCV: 78.2 fL (ref 78.0–100.0)
MCV: 77.3 fL — AB (ref 78.0–100.0)
Platelets: 140 10*3/uL — ABNORMAL LOW (ref 150–400)
Platelets: 146 10*3/uL — ABNORMAL LOW (ref 150–400)
RBC: 4.45 MIL/uL (ref 4.22–5.81)
RBC: 4.37 MIL/uL (ref 4.22–5.81)
RDW: 16.3 % — ABNORMAL HIGH (ref 11.5–15.5)
RDW: 16.4 % — ABNORMAL HIGH (ref 11.5–15.5)
WBC: 23.2 10*3/uL — AB (ref 4.0–10.5)
WBC: 19.1 10*3/uL — AB (ref 4.0–10.5)

## 2017-03-04 LAB — CREATININE, SERUM
Creatinine, Ser: 11.96 mg/dL — ABNORMAL HIGH (ref 0.61–1.24)
GFR calc non Af Amer: 4 mL/min — ABNORMAL LOW (ref >60)
GFR, EST AFRICAN AMERICAN: 5 mL/min — AB (ref >60)

## 2017-03-04 LAB — PROTIME-INR
INR: 1.21
PROTHROMBIN TIME: 15.3 s — AB (ref 11.4–15.2)

## 2017-03-04 LAB — I-STAT CG4 LACTIC ACID, ED
Lactic Acid, Venous: 3.3 mmol/L (ref 0.5–1.9)
Lactic Acid, Venous: 1.94 mmol/L (ref 0.5–1.9)

## 2017-03-04 LAB — BLOOD GAS, ARTERIAL
ACID-BASE EXCESS: 1.9 mmol/L (ref 0.0–2.0)
Bicarbonate: 25.9 mmol/L (ref 20.0–28.0)
Drawn by: 295031
O2 CONTENT: 3 L/min
O2 SAT: 91 %
PCO2 ART: 40.3 mmHg (ref 32.0–48.0)
Patient temperature: 98.6
pH, Arterial: 7.425 (ref 7.350–7.450)
pO2, Arterial: 68.3 mmHg — ABNORMAL LOW (ref 83.0–108.0)

## 2017-03-04 LAB — INFLUENZA PANEL BY PCR (TYPE A & B)
Influenza A By PCR: NEGATIVE
Influenza B By PCR: NEGATIVE

## 2017-03-04 LAB — GLUCOSE, CAPILLARY: Glucose-Capillary: 329 mg/dL — ABNORMAL HIGH (ref 65–99)

## 2017-03-04 LAB — CBG MONITORING, ED
GLUCOSE-CAPILLARY: 295 mg/dL — AB (ref 65–99)
Glucose-Capillary: 249 mg/dL — ABNORMAL HIGH (ref 65–99)

## 2017-03-04 LAB — I-STAT TROPONIN, ED: TROPONIN I, POC: 0.03 ng/mL (ref 0.00–0.08)

## 2017-03-04 LAB — LACTIC ACID, PLASMA: LACTIC ACID, VENOUS: 1.5 mmol/L (ref 0.5–1.9)

## 2017-03-04 MED ORDER — HEPARIN SODIUM (PORCINE) 5000 UNIT/ML IJ SOLN
5000.0000 [IU] | Freq: Three times a day (TID) | INTRAMUSCULAR | Status: DC
  Administered 2017-03-04 – 2017-03-06 (×5): 5000 [IU] via SUBCUTANEOUS
  Filled 2017-03-04 (×4): qty 1

## 2017-03-04 MED ORDER — GABAPENTIN 100 MG PO CAPS
200.0000 mg | ORAL_CAPSULE | Freq: Two times a day (BID) | ORAL | Status: DC
  Administered 2017-03-04 – 2017-03-14 (×20): 200 mg via ORAL
  Filled 2017-03-04 (×20): qty 2

## 2017-03-04 MED ORDER — INDOMETHACIN 50 MG PO CAPS: 50.0000 mg | ORAL_CAPSULE | Freq: Every day | ORAL | Status: DC

## 2017-03-04 MED ORDER — VANCOMYCIN HCL 10 G IV SOLR
2500.0000 mg | Freq: Once | INTRAVENOUS | Status: AC
  Administered 2017-03-04: 2500 mg via INTRAVENOUS
  Filled 2017-03-04: qty 2000

## 2017-03-04 MED ORDER — ACETAMINOPHEN 650 MG RE SUPP: 650.0000 mg | Freq: Four times a day (QID) | RECTAL | Status: DC | PRN

## 2017-03-04 MED ORDER — ONDANSETRON HCL 4 MG PO TABS: 4.0000 mg | ORAL_TABLET | Freq: Four times a day (QID) | ORAL | Status: DC | PRN

## 2017-03-04 MED ORDER — CINACALCET HCL 30 MG PO TABS
60.0000 mg | ORAL_TABLET | Freq: Every day | ORAL | Status: DC
  Administered 2017-03-05 – 2017-03-13 (×8): 60 mg via ORAL
  Filled 2017-03-04 (×8): qty 2

## 2017-03-04 MED ORDER — AMLODIPINE BESYLATE 5 MG PO TABS: 5.0000 mg | ORAL_TABLET | Freq: Every day | ORAL | Status: DC

## 2017-03-04 MED ORDER — SODIUM CHLORIDE 0.9 % IV BOLUS (SEPSIS)
1000.0000 mL | Freq: Once | INTRAVENOUS | Status: AC
  Administered 2017-03-04: 1000 mL via INTRAVENOUS

## 2017-03-04 MED ORDER — CARVEDILOL 6.25 MG PO TABS
6.2500 mg | ORAL_TABLET | Freq: Two times a day (BID) | ORAL | Status: DC
  Administered 2017-03-04 – 2017-03-05 (×3): 6.25 mg via ORAL
  Filled 2017-03-04 (×3): qty 1

## 2017-03-04 MED ORDER — INSULIN ASPART 100 UNIT/ML ~~LOC~~ SOLN
10.0000 [IU] | Freq: Once | SUBCUTANEOUS | Status: AC
  Administered 2017-03-04: 10 [IU] via SUBCUTANEOUS
  Filled 2017-03-04: qty 1

## 2017-03-04 MED ORDER — ONDANSETRON HCL 4 MG/2ML IJ SOLN
INTRAMUSCULAR | Status: AC
  Administered 2017-03-04: 4 mg via INTRAVENOUS
  Filled 2017-03-04: qty 2

## 2017-03-04 MED ORDER — PIPERACILLIN-TAZOBACTAM 3.375 G IVPB 30 MIN
3.3750 g | Freq: Once | INTRAVENOUS | Status: AC
  Administered 2017-03-04: 3.375 g via INTRAVENOUS
  Filled 2017-03-04: qty 50

## 2017-03-04 MED ORDER — INSULIN ASPART 100 UNIT/ML ~~LOC~~ SOLN
10.0000 [IU] | Freq: Three times a day (TID) | SUBCUTANEOUS | Status: DC
  Administered 2017-03-05 – 2017-03-06 (×4): 10 [IU] via SUBCUTANEOUS

## 2017-03-04 MED ORDER — PRAVASTATIN SODIUM 40 MG PO TABS
40.0000 mg | ORAL_TABLET | Freq: Every day | ORAL | Status: DC
  Administered 2017-03-04 – 2017-03-13 (×10): 40 mg via ORAL
  Filled 2017-03-04 (×10): qty 1

## 2017-03-04 MED ORDER — KETOROLAC TROMETHAMINE 30 MG/ML IJ SOLN
30.0000 mg | Freq: Four times a day (QID) | INTRAMUSCULAR | Status: DC
Start: 2017-03-05 — End: 2017-03-04

## 2017-03-04 MED ORDER — INSULIN DETEMIR 100 UNIT/ML ~~LOC~~ SOLN
15.0000 [IU] | Freq: Every day | SUBCUTANEOUS | Status: DC
  Administered 2017-03-04: 15 [IU] via SUBCUTANEOUS
  Filled 2017-03-04: qty 0.15

## 2017-03-04 MED ORDER — IOPAMIDOL (ISOVUE-370) INJECTION 76%
100.0000 mL | Freq: Once | INTRAVENOUS | Status: AC | PRN
  Administered 2017-03-04: 100 mL via INTRAVENOUS

## 2017-03-04 MED ORDER — VANCOMYCIN HCL IN DEXTROSE 1-5 GM/200ML-% IV SOLN: 1000.0000 mg | Freq: Once | INTRAVENOUS | Status: DC

## 2017-03-04 MED ORDER — IPRATROPIUM-ALBUTEROL 0.5-2.5 (3) MG/3ML IN SOLN
3.0000 mL | Freq: Once | RESPIRATORY_TRACT | Status: AC
  Administered 2017-03-04: 3 mL via RESPIRATORY_TRACT
  Filled 2017-03-04: qty 3

## 2017-03-04 MED ORDER — FERRIC CITRATE 1 GM 210 MG(FE) PO TABS
210.0000 mg | ORAL_TABLET | Freq: Three times a day (TID) | ORAL | Status: DC
  Administered 2017-03-04 – 2017-03-06 (×5): 210 mg via ORAL
  Filled 2017-03-04 (×6): qty 1

## 2017-03-04 MED ORDER — KETOROLAC TROMETHAMINE 30 MG/ML IJ SOLN
30.0000 mg | Freq: Four times a day (QID) | INTRAMUSCULAR | Status: AC | PRN
  Administered 2017-03-07: 30 mg via INTRAVENOUS
  Filled 2017-03-04: qty 1

## 2017-03-04 MED ORDER — LORATADINE 10 MG PO TABS: 10.0000 mg | ORAL_TABLET | Freq: Every day | ORAL | Status: DC | PRN

## 2017-03-04 MED ORDER — ONDANSETRON HCL 4 MG/2ML IJ SOLN: 4.0000 mg | Freq: Four times a day (QID) | INTRAMUSCULAR | Status: DC | PRN

## 2017-03-04 MED ORDER — PIPERACILLIN-TAZOBACTAM 3.375 G IVPB
3.3750 g | Freq: Two times a day (BID) | INTRAVENOUS | Status: DC
  Administered 2017-03-04 – 2017-03-05 (×2): 3.375 g via INTRAVENOUS
  Filled 2017-03-04 (×3): qty 50

## 2017-03-04 MED ORDER — ONDANSETRON HCL 4 MG/2ML IJ SOLN
4.0000 mg | Freq: Once | INTRAMUSCULAR | Status: AC
  Administered 2017-03-04: 4 mg via INTRAVENOUS

## 2017-03-04 MED ORDER — ALLOPURINOL 100 MG PO TABS
100.0000 mg | ORAL_TABLET | Freq: Two times a day (BID) | ORAL | Status: DC
  Administered 2017-03-04 – 2017-03-07 (×7): 100 mg via ORAL
  Filled 2017-03-04 (×7): qty 1

## 2017-03-04 MED ORDER — FUROSEMIDE 40 MG PO TABS: 40.0000 mg | ORAL_TABLET | Freq: Every day | ORAL | Status: DC

## 2017-03-04 MED ORDER — ASPIRIN EC 81 MG PO TBEC
81.0000 mg | DELAYED_RELEASE_TABLET | Freq: Every day | ORAL | Status: DC
  Administered 2017-03-04 – 2017-03-14 (×10): 81 mg via ORAL
  Filled 2017-03-04 (×10): qty 1

## 2017-03-04 MED ORDER — ACETAMINOPHEN 325 MG PO TABS
650.0000 mg | ORAL_TABLET | Freq: Four times a day (QID) | ORAL | Status: DC | PRN
  Administered 2017-03-04 – 2017-03-11 (×10): 650 mg via ORAL
  Filled 2017-03-04 (×9): qty 2

## 2017-03-04 NOTE — ED Notes (Signed)
Patient taken off BiPap per Intensivist/intensivist NP

## 2017-03-04 NOTE — Consult Note (Signed)
Name: [name] MRN: [id number] DOB: 10/29/1956    ADMISSION DATE:  03/04/2017 CONSULTATION DATE:  03/04/17  REFERRING MD :  Dr. Johnney Killian / EDP   CHIEF COMPLAINT:  SOB   HISTORY OF PRESENT ILLNESS:  61 y/o M who presented to [place] ER on 4/20 with complaints of shortness of breath, chills, and low blood pressure.   The patient has a history of ESRD on HD (M,W,F).  He went to dialysis as regularly planned and was concerned as he thought he might be over his dry weight.  The patient had noticed increased scrotal swelling over the past week with chaffing.  He denies sharp testicular pain or pain radiating into his groin.  He states he has tried ice to the scrotum and placing an onion at the site with some improvement.  He also has noted a couple episodes of diarrhea and vomiting (after eating a rice crispy cookie) over the past two days.  He reports the diarrhea was small volume and no blood.  His wife reports concern over his R forearm AV fistula > he recently had it manipulated and she has been concerned that it is not healing well.  She reports they have gone back to [facility] 3 times out of concerns.  At the dialysis center on 4/20, he was under his dry weight.  He was unable to tolerate HD due to low blood pressure and he was sent to the ER for evaluation.  He was also short of breath.    Initial ER evaluation found him to be short of breath and he was placed on BiPAP.  ABG evaluation showed 7.425 / 40 / 68 / 25.9.  He was hypotensive with BP in the 70's.  The patient was given 2L NS bolus with improvement of BP to 129/74.  BiPAP was removed and patient was placed on 3L [place].  CTA was assessed for PE and was negative.  CT was also negative for PNA or acute cardio-pulmoanry process.  It did show subsegmental atelectasis. Scrotal US was obtained and was unable to exclude bilateral epididymitis, small 0.6 cm area of increased echogenicity in left testicle (fat vs tumor), no evidence of torsion.    Labs - Na 132, K 4.1, Cl 89, glucose 322, BUN 44 / Cr 11.68, AG 17, troponin 0.03, lactic acid 3.30, WBC 23.2, hgb 11.2 and platelets 140.   He denies chest pain, pain with inspiration, syncope, dizziness, lightheadedness, melena, hematochezia.  PCCM consulted for evaluation of possible sepsis.   PAST MEDICAL HISTORY :   has a past medical history of Alcohol abuse; Congestive heart disease ([place]) (12/2011); Diabetes mellitus; Gout; Hepatitis; Hyperlipidemia; Hyperlipidemia; Hypertension; Monoclonal gammopathies (02/25/2012); Pedal edema; Renal insufficiency; Sleep apnea; and Tobacco abuse.   has a past surgical history that includes Left knee arthroscopy with generalized joint synovectomy, (01/2006); Ganglion cyst removal x 2; Insertion of dialysis catheter (N/A, 01/07/2014); AV fistula placement (Right, 01/10/2014); Colonoscopy; Ligation of competing branches of arteriovenous fistula (Right, 05/28/2014); shuntogram (Right, 05/14/2014); Eye surgery; and Revison of arteriovenous fistula (Right, 01/19/2017).  Prior to Admission medications   Medication Sig Start Date End Date Taking? Authorizing Provider  acetaminophen (TYLENOL) 500 MG tablet Take 1,000 mg by mouth every 6 (six) hours as needed for mild pain, moderate pain, fever or headache.    Yes Historical Provider, MD  allopurinol (ZYLOPRIM) 100 MG tablet Take 100 mg by mouth 2 (two) times daily.    Yes Historical Provider, MD  amLODipine (NORVASC)  5 MG tablet Take 5 mg by mouth daily.   Yes Historical Provider, MD  aspirin EC 81 MG tablet Take 81 mg by mouth daily.   Yes Historical Provider, MD  carvedilol (COREG) 6.25 MG tablet Take 1 tablet (6.25 mg total) by mouth 2 (two) times daily with a meal. 01/13/12  Yes Radene Gunning, NP  cinacalcet (SENSIPAR) 60 MG tablet Take 60 mg by mouth daily.   Yes Historical Provider, MD  ferric citrate (AURYXIA) 1 GM 210 MG(Fe) tablet Take 210 mg by mouth 3 (three) times daily with meals.   Yes Historical Provider, MD    furosemide (LASIX) 40 MG tablet Take 40 mg by mouth daily.   Yes Historical Provider, MD  gabapentin (NEURONTIN) 100 MG capsule Take 200 mg by mouth 2 (two) times daily.   Yes Historical Provider, MD  indomethacin (INDOCIN) 50 MG capsule Take 50 mg by mouth daily.    Yes Historical Provider, MD  Insulin Detemir (LEVEMIR FLEXTOUCH) 100 UNIT/ML Pen Inject 30 Units into the skin at bedtime.   Yes Historical Provider, MD  loratadine (CLARITIN) 10 MG tablet Take 10 mg by mouth daily as needed for allergies.   Yes Historical Provider, MD  NOVOLOG FLEXPEN 100 UNIT/ML FlexPen Inject 20 Units into the skin 3 (three) times daily with meals.    Yes Historical Provider, MD  pravastatin (PRAVACHOL) 40 MG tablet Take 40 mg by mouth daily.    Yes Historical Provider, MD    Allergies  Allergen Reactions  . Penicillins Nausea And Vomiting and Other (See Comments)    Has patient had a PCN reaction causing immediate rash, facial/tongue/throat swelling, SOB or lightheadedness with hypotension: No Has patient had a PCN reaction causing severe rash involving mucus membranes or skin necrosis: No Has patient had a PCN reaction that required hospitalization No Has patient had a PCN reaction occurring within the last 10 years: No If all of the above answers are "NO", then may proceed with Cephalosporin use.  . Vioxx [Rofecoxib] Nausea And Vomiting    FAMILY HISTORY:  family history includes Diabetes in his mother; Heart disease in his mother; Lung cancer in his father.  SOCIAL HISTORY:  reports that he quit smoking about 6 years ago. His smoking use included Cigarettes. He has a 9.90 pack-year smoking history. He has never used smokeless tobacco. He reports that he does not drink alcohol or use drugs.  REVIEW OF SYSTEMS:  POSTIVES IN BOLD Constitutional: Negative for fever, chills, weight loss, malaise/fatigue and diaphoresis.  HENT: Negative for hearing loss, ear pain, nosebleeds, congestion, sore throat, neck  pain, tinnitus and ear discharge.   Eyes: Negative for blurred vision, double vision, photophobia, pain, discharge and redness.  Respiratory: Negative for cough, hemoptysis, sputum production, shortness of breath, wheezing and stridor.   Cardiovascular: Negative for chest pain, palpitations, orthopnea, claudication, leg swelling and PND.  Gastrointestinal: Negative for heartburn, nausea, vomiting, abdominal pain, diarrhea, constipation, blood in stool and melena.  Genitourinary: Negative for dysuria, urgency, frequency, hematuria and flank pain.  Musculoskeletal: Negative for myalgias, back pain, joint pain and falls.  Skin: Negative for itching and rash.  Neurological: Negative for dizziness, tingling, tremors, sensory change, speech change, focal weakness, seizures, loss of consciousness, weakness and headaches.  Endo/Heme/Allergies: Negative for environmental allergies and polydipsia. Does not bruise/bleed easily.  SUBJECTIVE:   VITAL SIGNS: Temp:  [98.5 F (36.9 C)-100.3 F (37.9 C)] 100.3 F (37.9 C) (04/20 1216) Pulse Rate:  [88-96] 95 (04/20 1216) Resp:  [  17-36] 21 (04/20 1216) BP: (79-145)/(56-79) 145/59 (04/20 1216) SpO2:  [84 %-100 %] 91 % (04/20 1216) Weight:  [343 lb 14.7 oz (156 kg)] 343 lb 14.7 oz (156 kg) (04/20 0904)  PHYSICAL EXAMINATION: General:  Obese adult male in NAD HEENT: MM pink/moist, unable to appreciate JVD PSY: calm/appropriate Neuro: AAOx4, speech clear, MAE CV: s1s2 rrr, no m/r/g PULM: even/non-labored, lungs bilaterally clear  MQ:KMMN, non-tender, bsx4 active  Extremities: warm/dry, no edema  Skin: no rashes or lesions    Recent Labs Lab 03/04/17 0930  NA 132*  K 4.1  CL 89*  CO2 26  BUN 44*  CREATININE 11.68*  GLUCOSE 322*     Recent Labs Lab 03/04/17 0930  HGB 11.2*  HCT 34.8*  WBC 23.2*  PLT 140*    Dg Chest 2 View  Result Date: 03/04/2017 CLINICAL DATA:  Increased shortness of breath and decreased O2 saturations. EXAM:  CHEST  2 VIEW COMPARISON:  01/07/2014 FINDINGS: Azygos shadow and central vascular structures are mildly prominent. Findings could be related to vascular congestion. There is no frank pulmonary edema. Heart size is within limits. No pleural effusions. No acute bone abnormality. Negative for a pneumothorax. IMPRESSION: Probable vascular congestion without overt pulmonary edema. Electronically Signed   By: Markus Daft M.D.   On: 03/04/2017 09:25   US Scrotum  Result Date: 03/04/2017 CLINICAL DATA:  Scrotal pain and swelling. EXAM: SCROTAL ULTRASOUND DOPPLER ULTRASOUND OF THE TESTICLES TECHNIQUE: Complete ultrasound examination of the testicles, epididymis, and other scrotal structures was performed. Color and spectral Doppler ultrasound were also utilized to evaluate blood flow to the testicles. COMPARISON:  No recent prior. FINDINGS: Right testicle Measurements: 3.4 x 2.0 x 2.5 cm. No mass or microlithiasis visualized. Left testicle Measurements: 3.7 x 2.4 x 2.8 cm. 0.6 x 0.3 x 0.4 small area of increased echogenicity noted in the left testicle. Although this may represent a focal area of fat or a at left testicular tumor cannot be completely excluded. No microlithiasis visualized . Right epididymis:  Tail of the epididymis is prominent. Left epididymis: Prominent epididymis with heterogeneous echotexture. 7 mm epididymal cyst. Hydrocele:  None visualized. Varicocele: Could not perform Valsalva maneuver due to patient's clinical condition. Bilateral scrotal skin thickening. Pulsed Doppler interrogation of both testes demonstrates normal low resistance arterial and venous waveforms bilaterally. IMPRESSION: 1. Cannot exclude bilateral epididymitis. 2. Small 0.6 cm area of increased echogenicity noted in the left testicle. Although this may represent small area of fat a small left testicular tumor cannot be excluded no evidence of torsion. Electronically Signed   By: Marcello Moores  Register   On: 03/04/2017 11:32   [place]  Art/ven Flow Abd Pelv Doppler  Result Date: 03/04/2017 CLINICAL DATA:  Scrotal pain and swelling. EXAM: SCROTAL ULTRASOUND DOPPLER ULTRASOUND OF THE TESTICLES TECHNIQUE: Complete ultrasound examination of the testicles, epididymis, and other scrotal structures was performed. Color and spectral Doppler ultrasound were also utilized to evaluate blood flow to the testicles. COMPARISON:  No recent prior. FINDINGS: Right testicle Measurements: 3.4 x 2.0 x 2.5 cm. No mass or microlithiasis visualized. Left testicle Measurements: 3.7 x 2.4 x 2.8 cm. 0.6 x 0.3 x 0.4 small area of increased echogenicity noted in the left testicle. Although this may represent a focal area of fat or a at left testicular tumor cannot be completely excluded. No microlithiasis visualized . Right epididymis:  Tail of the epididymis is prominent. Left epididymis: Prominent epididymis with heterogeneous echotexture. 7 mm epididymal cyst. Hydrocele:  None visualized. Varicocele:  Could not perform Valsalva maneuver due to patient's clinical condition. Bilateral scrotal skin thickening. Pulsed Doppler interrogation of both testes demonstrates normal low resistance arterial and venous waveforms bilaterally. IMPRESSION: 1. Cannot exclude bilateral epididymitis. 2. Small 0.6 cm area of increased echogenicity noted in the left testicle. Although this may represent small area of fat a small left testicular tumor cannot be excluded no evidence of torsion. Electronically Signed   By: Marcello Moores  Register   On: 03/04/2017 11:32      SIGNIFICANT EVENTS  4/20  Admit with hypotension, possible sepsis  STUDIES:  CTA Chest 4/20 >> neg for PE, negative for PNA or acute cardio-pulmoanry process, + subsegmental atelectasis Scrotal US 4/20 >> unable to exclude bilateral epididymitis, small 0.6 cm area of increased echogenicity in left testicle (fat vs tumor), no evidence of torsion.     ASSESSMENT / PLAN:  Discussion:  61 y/o morbidly obese male admitted  4/20 with hypotension, elevated lactic acid (3), SOB and testicular pain/swelling.  Initial work up concerning for possible sepsis.  Patient has responded well to volume and is alert / oriented.  DDx for infection include possible bacteremia with recent AVF manipulation, epididymitis (see [place] above) and possible component of volume depletion given N/V.  PE ruled out.  Testicular torsion ruled out.     Sepsis - ddx as above Plan: Admit per [facility] to SDU  Empiric abx for nosocomial coverage Trend Lactic acid  Completed 2L NS in ER  Monitor BP trend on SDU  Follow blood cultures   ESRD on HD  Lactic Acidosis - suspect element of poor clearance with renal disease + acute hypotension  Plan: Will need to be admitted at [facility] due to intermittent HD needs  Will need Nephrology input for HD  Trend BMP / urinary output Replace electrolytes as indicated Trend lactate   Epididymitis - torsion ruled out via [place] Plan: May need Urology evaluation  ABX per primary    Nausea / Vomiting - isolated episodes, none today  Plan: PRN antiemetics Diet as tolerated    IDDM - poorly controlled  Plan: SSI per primary  Consider input from DM Coordinator      Obstructive Sleep Apnea  Hx RUL Apical Nodule - subsequent resolution on CT Former Smoker Plan: Nocturnal CPAP  Follow up with Pulmonary as outpatient for OSA > due for an appointment with Dr. Lamonte Sakai in May 2018  Hx Hepatitis C+ - treated per ID with [facility]: Per primary MD     PCCM will follow up in am 4/21.    Noe Gens, NP-[facility] Pgr: [phone] or if no answer [phone] 03/04/2017, 12:27 PM

## 2017-03-04 NOTE — H&P (Addendum)
History and Physical    [name] OIN:[id number] DOB: Feb 02, 1956 DOA: 03/04/2017    PCP: Gara Kroner, MD  Patient coming from: home  Chief Complaint: dyspnea  HPI: [name] is a 61 y.o. male with medical history of ESRD on HD, MGUS, Gout, Hep C (recently treated), morbid obesity, sleep apnea who presents from dialysis for sudden shortness of breath. The patient keeps falling asleep and so the wife helps with history. His BP apparently kept dropping while in dialysis. He completed dialysis. 1 pound of fluid was removed to bring him back to his baseline weight. Due to severe dyspnea he was sent to the hospital. No cough, congestion, runny nose or sore thoat. No abdominal pain. No fever or chills.  When I am about to leave the room his wife tells me that she has been trying to get him to go to the doctor for 3 days now mainly for vomiting and diarrhea. They ate out at a restaurant on the day of the [place] (Sunday) and he has since had diarrhea and vomiting. Yesterday he felt exhausted and could not go to work. He vomited again last night.   He also states that his scrotum has been swollen for a few days now. It did not hurt until it was palpated in the ER.   Wife states his old fistula wound does not look right to her.  ED Course:  BP 79/61, RR in 30s, needing 3-4 L to keep pulse ox in low 90s, HR in normal range Lactic acid 3.30 >> 1.94 WBC 23.2 CTA does not show PE but unable to see distal vessels, no infiltrate  Review of Systems:  All other systems reviewed and apart from HPI, are negative.  Past Medical History:  Diagnosis Date  . Alcohol abuse   . Congestive heart disease ([place]) 12/2011  . Diabetes mellitus   . Gout   . Hepatitis    being treated for Hepatitis C  . Hyperlipidemia   . Hyperlipidemia   . Hypertension   . Monoclonal gammopathies 02/25/2012  . Pedal edema   . Sleep apnea    uses cpap  . Tobacco abuse     Past Surgical History:  Procedure  Laterality Date  . AV FISTULA PLACEMENT Right 01/10/2014   Procedure: ARTERIOVENOUS (AV) FISTULA CREATION- RIGHT RADIOCEPHALIC ;  Surgeon: Angelia Mould, MD;  Location: [facility];  Service: Vascular;  Laterality: Right;  . COLONOSCOPY    . EYE SURGERY     cataract surgery  . Ganglion cyst removal x 2    . INSERTION OF DIALYSIS CATHETER N/A 01/07/2014   Procedure: INSERTION OF DIALYSIS CATHETER;  Surgeon: Conrad Tamarac, MD;  Location: [facility];  Service: Vascular;  Laterality: N/A;  . Left knee arthroscopy with generalized joint synovectomy,  01/2006  . LIGATION OF COMPETING BRANCHES OF ARTERIOVENOUS FISTULA Right 05/28/2014   Procedure: LIGATION OF COMPETING BRANCHES OF ARTERIOVENOUS FISTULA;  Surgeon: Angelia Mould, MD;  Location: [facility];  Service: Vascular;  Laterality: Right;  . REVISON OF ARTERIOVENOUS FISTULA Right 7/0/9628   Procedure: PLICATION REPAIR OF ARTERIOVENOUS FISTULA PSEUDOANEURYSM;  Surgeon: Serafina Mitchell, MD;  Location: [facility];  Service: Vascular;  Laterality: Right;  . SHUNTOGRAM Right 05/14/2014   Procedure: FISTULOGRAM;  Surgeon: Serafina Mitchell, MD;  Location: [facility] CATH LAB;  Service: Cardiovascular;  Laterality: Right;    Social History:   reports that he quit smoking about 6 years ago. His smoking use included Cigarettes.  He has a 9.90 pack-year smoking history. He has never used smokeless tobacco. He reports that he does not drink alcohol or use drugs.  Allergies  Allergen Reactions  . Penicillins Nausea And Vomiting and Other (See Comments)    Has patient had a PCN reaction causing immediate rash, facial/tongue/throat swelling, SOB or lightheadedness with hypotension: No Has patient had a PCN reaction causing severe rash involving mucus membranes or skin necrosis: No Has patient had a PCN reaction that required hospitalization No Has patient had a PCN reaction occurring within the last 10 years: No If all of the above answers are "NO", then may proceed with  Cephalosporin use.  . Vioxx [Rofecoxib] Nausea And Vomiting    Family History  Problem Relation Age of Onset  . Diabetes Mother   . Heart disease Mother   . Lung cancer Father      Prior to Admission medications   Medication Sig Start Date End Date Taking? Authorizing Provider  acetaminophen (TYLENOL) 500 MG tablet Take 1,000 mg by mouth every 6 (six) hours as needed for mild pain, moderate pain, fever or headache.    Yes Historical Provider, MD  allopurinol (ZYLOPRIM) 100 MG tablet Take 100 mg by mouth 2 (two) times daily.    Yes Historical Provider, MD  amLODipine (NORVASC) 5 MG tablet Take 5 mg by mouth daily.   Yes Historical Provider, MD  aspirin EC 81 MG tablet Take 81 mg by mouth daily.   Yes Historical Provider, MD  carvedilol (COREG) 6.25 MG tablet Take 1 tablet (6.25 mg total) by mouth 2 (two) times daily with a meal. 01/13/12  Yes Radene Gunning, NP  cinacalcet (SENSIPAR) 60 MG tablet Take 60 mg by mouth daily.   Yes Historical Provider, MD  ferric citrate (AURYXIA) 1 GM 210 MG(Fe) tablet Take 210 mg by mouth 3 (three) times daily with meals.   Yes Historical Provider, MD  furosemide (LASIX) 40 MG tablet Take 40 mg by mouth daily.   Yes Historical Provider, MD  gabapentin (NEURONTIN) 100 MG capsule Take 200 mg by mouth 2 (two) times daily.   Yes Historical Provider, MD  indomethacin (INDOCIN) 50 MG capsule Take 50 mg by mouth daily.    Yes Historical Provider, MD  Insulin Detemir (LEVEMIR FLEXTOUCH) 100 UNIT/ML Pen Inject 30 Units into the skin at bedtime.   Yes Historical Provider, MD  loratadine (CLARITIN) 10 MG tablet Take 10 mg by mouth daily as needed for allergies.   Yes Historical Provider, MD  NOVOLOG FLEXPEN 100 UNIT/ML FlexPen Inject 20 Units into the skin 3 (three) times daily with meals.    Yes Historical Provider, MD  pravastatin (PRAVACHOL) 40 MG tablet Take 40 mg by mouth daily.    Yes Historical Provider, MD    Physical Exam: Wt Readings from Last 3 Encounters:   03/04/17 (!) 156 kg (343 lb 14.7 oz)  02/03/17 (!) 154.2 kg (340 lb)  01/24/17 (!) 158.8 kg (350 lb)   Vitals:   03/04/17 1257 03/04/17 1300 03/04/17 1315 03/04/17 1330  BP: (!) 162/84 111/76  (!) 102/52  Pulse: 94 93    Resp: (!) 33 17 (!) 24 (!) 31  Temp:      TempSrc:      SpO2: 93% 93%    Weight:      Height:          Constitutional: NAD, calm, comfortable Eyes: PERTLA, lids and conjunctivae normal ENMT: Mucous membranes are moist. Posterior pharynx clear of  any exudate or lesions. Normal dentition.  Neck: normal, supple, no masses, no thyromegaly Respiratory: clear to auscultation bilaterally, no wheezing, no crackles. Normal respiratory effort. No accessory muscle use.  Cardiovascular: S1 & S2 heard, regular rate and rhythm, no murmurs / rubs / gallops. No extremity edema. 2+ pedal pulses. No carotid bruits.  Abdomen: No distension, no tenderness, no masses palpated. No hepatosplenomegaly. Bowel sounds normal.  GU: scrotal swelling and tenderness Musculoskeletal: no clubbing / cyanosis. No joint deformity upper and lower extremities. Good ROM, no contractures. Normal muscle tone.  Skin: no rashes, lesions, ulcers. No induration Neurologic: CN 2-12 grossly intact. Sensation intact, DTR normal. Strength 5/5 in all 4 limbs.  Psychiatric: Normal judgment and insight. Alert and oriented x 3. Normal mood.     Labs on Admission: I have personally reviewed following labs and imaging studies  CBC:  Recent Labs Lab 03/04/17 0930  WBC 23.2*  NEUTROABS 18.7*  HGB 11.2*  HCT 34.8*  MCV 78.2  PLT 213*   Basic Metabolic Panel:  Recent Labs Lab 03/04/17 0930  NA 132*  K 4.1  CL 89*  CO2 26  GLUCOSE 322*  BUN 44*  CREATININE 11.68*  CALCIUM 8.5*   GFR: Estimated Creatinine Clearance: 10.5 mL/min (A) (by C-G formula based on SCr of 11.68 mg/dL (H)). Liver Function Tests:  Recent Labs Lab 03/04/17 0930  AST 49*  ALT 24  ALKPHOS 125  BILITOT 0.6  PROT 8.2*   ALBUMIN 2.9*   No results for input(s): LIPASE, AMYLASE in the last 168 hours. No results for input(s): AMMONIA in the last 168 hours. Coagulation Profile:  Recent Labs Lab 03/04/17 0930  INR 1.21   Cardiac Enzymes: No results for input(s): CKTOTAL, CKMB, CKMBINDEX, TROPONINI in the last 168 hours. BNP (last 3 results) No results for input(s): PROBNP in the last 8760 hours. HbA1C: No results for input(s): HGBA1C in the last 72 hours. CBG: No results for input(s): GLUCAP in the last 168 hours. Lipid Profile: No results for input(s): CHOL, HDL, LDLCALC, TRIG, CHOLHDL, LDLDIRECT in the last 72 hours. Thyroid Function Tests: No results for input(s): TSH, T4TOTAL, FREET4, T3FREE, THYROIDAB in the last 72 hours. Anemia Panel: No results for input(s): VITAMINB12, FOLATE, FERRITIN, TIBC, IRON, RETICCTPCT in the last 72 hours. Urine analysis:    Component Value Date/Time   COLORURINE YELLOW 01/10/2012 1404   APPEARANCEUR CLEAR 01/10/2012 1404   LABSPEC 1.023 01/10/2012 1404   PHURINE 6.0 01/10/2012 1404   GLUCOSEU >1000 (A) 01/10/2012 1404   HGBUR TRACE (A) 01/10/2012 1404   BILIRUBINUR NEGATIVE 01/10/2012 1404   KETONESUR NEGATIVE 01/10/2012 1404   PROTEINUR >300 (A) 01/10/2012 1404   UROBILINOGEN 1.0 01/10/2012 1404   NITRITE NEGATIVE 01/10/2012 1404   LEUKOCYTESUR NEGATIVE 01/10/2012 1404   Sepsis Labs: @LABRCNTIP (procalcitonin:4,lacticidven:4) )No results found for this or any previous visit (from the past 240 hour(s)).   Radiological Exams on Admission: Dg Chest 2 View  Result Date: 03/04/2017 CLINICAL DATA:  Increased shortness of breath and decreased O2 saturations. EXAM: CHEST  2 VIEW COMPARISON:  01/07/2014 FINDINGS: Azygos shadow and central vascular structures are mildly prominent. Findings could be related to vascular congestion. There is no frank pulmonary edema. Heart size is within limits. No pleural effusions. No acute bone abnormality. Negative for a  pneumothorax. IMPRESSION: Probable vascular congestion without overt pulmonary edema. Electronically Signed   By: Markus Daft M.D.   On: 03/04/2017 09:25   Ct Angio Chest Pe W/cm &/or Wo Cm  Result Date: 03/04/2017 CLINICAL DATA:  61 year old male with increased shortness of breath and hypoxia following dialysis earlier today. EXAM: CT ANGIOGRAPHY CHEST WITH CONTRAST TECHNIQUE: Multidetector CT imaging of the chest was performed using the standard protocol during bolus administration of intravenous contrast. Multiplanar CT image reconstructions and MIPs were obtained to evaluate the vascular anatomy. CONTRAST:  100 mL Isovue 370 COMPARISON:  Chest x-ray 03/04/2017; prior CT scan of the chest 07/09/2014 FINDINGS: Cardiovascular: Limited opacification of the pulmonary arteries secondary to transient physiologic interruption of contrast. No central or proximal or lobar PE. No evidence of aortic dissection or aneurysm. Aberrant right subclavian artery noticed incidentally. The heart is enlarged. No pericardial effusion. Mediastinum/Nodes: Unremarkable CT appearance of the thyroid gland. No suspicious mediastinal or hilar adenopathy. No soft tissue mediastinal mass. The thoracic esophagus is unremarkable. Lungs/Pleura: Respiratory motion artifact limits evaluation for small pulmonary nodules. No focal airspace consolidation, pleural effusion, pneumothorax or pulmonary edema. Mild dependent atelectasis present in both lower lobes. Upper Abdomen: Although the liver is incompletely evaluated, the left hepatic lobe and caudate lobe appear relatively hypertrophied which may suggest early cirrhotic change. No acute abnormalities within the upper abdomen. Musculoskeletal: No acute fracture or aggressive appearing lytic or blastic osseous lesion. Review of the MIP images confirms the above findings. IMPRESSION: 1. No evidence of acute pulmonary embolus to the level of the proximal lobar arteries. Evaluation of the more distal  pulmonary arterial tree is nondiagnostic secondary to a combination of relatively poor vessel opacification and respiratory motion artifact. 2. No evidence of pneumonia or other acute cardiopulmonary process. 3. Bibasilar subsegmental atelectasis. 4. Aberrant right subclavian artery. 5. Although incompletely imaged, the morphology of the liver suggests the possibility of cirrhotic change. Does the patient have clinical signs/symptoms of cirrhosis? Electronically Signed   By: Jacqulynn Cadet M.D.   On: 03/04/2017 12:31   US Scrotum  Result Date: 03/04/2017 CLINICAL DATA:  Scrotal pain and swelling. EXAM: SCROTAL ULTRASOUND DOPPLER ULTRASOUND OF THE TESTICLES TECHNIQUE: Complete ultrasound examination of the testicles, epididymis, and other scrotal structures was performed. Color and spectral Doppler ultrasound were also utilized to evaluate blood flow to the testicles. COMPARISON:  No recent prior. FINDINGS: Right testicle Measurements: 3.4 x 2.0 x 2.5 cm. No mass or microlithiasis visualized. Left testicle Measurements: 3.7 x 2.4 x 2.8 cm. 0.6 x 0.3 x 0.4 small area of increased echogenicity noted in the left testicle. Although this may represent a focal area of fat or a at left testicular tumor cannot be completely excluded. No microlithiasis visualized . Right epididymis:  Tail of the epididymis is prominent. Left epididymis: Prominent epididymis with heterogeneous echotexture. 7 mm epididymal cyst. Hydrocele:  None visualized. Varicocele: Could not perform Valsalva maneuver due to patient's clinical condition. Bilateral scrotal skin thickening. Pulsed Doppler interrogation of both testes demonstrates normal low resistance arterial and venous waveforms bilaterally. IMPRESSION: 1. Cannot exclude bilateral epididymitis. 2. Small 0.6 cm area of increased echogenicity noted in the left testicle. Although this may represent small area of fat a small left testicular tumor cannot be excluded no evidence of torsion.  Electronically Signed   By: Marcello Moores  Register   On: 03/04/2017 11:32   [place] Art/ven Flow Abd Pelv Doppler  Result Date: 03/04/2017 CLINICAL DATA:  Scrotal pain and swelling. EXAM: SCROTAL ULTRASOUND DOPPLER ULTRASOUND OF THE TESTICLES TECHNIQUE: Complete ultrasound examination of the testicles, epididymis, and other scrotal structures was performed. Color and spectral Doppler ultrasound were also utilized to evaluate blood flow to the testicles. COMPARISON:  No recent prior. FINDINGS: Right testicle Measurements: 3.4 x 2.0 x 2.5 cm. No mass or microlithiasis visualized. Left testicle Measurements: 3.7 x 2.4 x 2.8 cm. 0.6 x 0.3 x 0.4 small area of increased echogenicity noted in the left testicle. Although this may represent a focal area of fat or a at left testicular tumor cannot be completely excluded. No microlithiasis visualized . Right epididymis:  Tail of the epididymis is prominent. Left epididymis: Prominent epididymis with heterogeneous echotexture. 7 mm epididymal cyst. Hydrocele:  None visualized. Varicocele: Could not perform Valsalva maneuver due to patient's clinical condition. Bilateral scrotal skin thickening. Pulsed Doppler interrogation of both testes demonstrates normal low resistance arterial and venous waveforms bilaterally. IMPRESSION: 1. Cannot exclude bilateral epididymitis. 2. Small 0.6 cm area of increased echogenicity noted in the left testicle. Although this may represent small area of fat a small left testicular tumor cannot be excluded no evidence of torsion. Electronically Signed   By: Marcello Moores  Register   On: 03/04/2017 11:32     EKG : pending  Assessment/Plan Principal Problem:   Acute respiratory failure with hypoxia  - CT chest negative - check Resp Virus panel and Influenza - ? Aspiration from vomiting -was on BiPAP initially- pulse ox on room air is 87% when he is alert- cont O2 as needed  Nausea, vomiting diarrhea - apparently not an issue today- will allow him to  have a solid diet- follow stool output  Severe Sepsis - temp of 100.3, hypotension, leukocytosis with Lactic acidosis - cont Vanc and Zosyn for now- blood cultures done - 2 L NS given in ER    Scrotal swelling  - Ultrasound suggestive of possible b/l epidydimitis- Dr Beatrix Fetters has told ER that he will see patient at [facility]    End stage renal disease - underwent dialysis today- will consult nephro - d/c Lasix- he does not make urine    Hypertension - hold Norvasc - holding parameters on Coreg    Hyperlipidemia - cont statin    Gout - allopurinol    Morbid Obesity - Body mass index is 46.64 kg/m.   OSA - CPAP ordered  Hep C  - treated    Diabetes mellitus 2 - on Levemir 30 BID and Novololg 20 TID with meals - will place on Levemir 15 BID and Novolog 10 TID with no sliding scale for now    Monoclonal gammopathies   DVT prophylaxis: Lovenox  Code Status: Full code  Family Communication: wife  Disposition Plan: med/surg  Consults called: nephro, Urology called by ER  Admission status: inpatient    Debbe Odea MD [facility] Pager: [email or web address] Password [email or web address] [phone], please contact night-coverage   03/04/2017, 2:40 PM

## 2017-03-04 NOTE — ED Notes (Signed)
rn at bedside collecting labs

## 2017-03-04 NOTE — ED Notes (Signed)
[facility] picking up patient. 

## 2017-03-04 NOTE — Consult Note (Signed)
Renal Service Consult Note [facility]  [name] 03/04/2017 [name] Requesting Physician:  Dr. Wynelle Cleveland  Reason for Consult:  ESRD pt with fevers, scrotal pain/ swelling HPI: The patient is a 61 y.o. year-old w/ history of ESRD on HD MWF, tobacco , HTN, HL, gout ,DM2 presenting to ED with somnolence, scrotal pain and fevers for last 2 days.  Had 2 hours dialysis today.  In ED WBC ^ 19-20k, +low grade fevers, pt tachypneic and borderline hypoxemic.  Had CT chest neg for PE or other findings ( no PNA, effusion, etc).  CXR clear.  Got 2L for hypotension (BP 70's) in ED, now BP 100/70. Got IV vanc/ zosyn, for transfer to [facility].  Urology called to see for possible scrotal infection.  Asked to see for ESRD.    Doesn't miss dialysis.  On norvasc/ coreg/ lasix for HTN.  Lives with his wife in [place].  No CP, no abd pain, +n/v x 2, no diarrhea.  No hx trauma.  Had R arm fistula revision w/ plication of aneurysms causing superficial ulceration 4-5 wks ago on 01/19/17.  They are sticking the proximal part of the fistula until these wounds all heal. No hx drainage per pt.    ROS  denies CP  no joint pain   no HA  no blurry vision  no rash  no diarrhe  no dysuria  no difficulty voiding  no change in urine color    Past Medical History  Past Medical History:  Diagnosis Date  . Alcohol abuse   . Congestive heart disease ([place]) 12/2011  . Diabetes mellitus   . Gout   . Hepatitis    being treated for Hepatitis C  . Hyperlipidemia   . Hyperlipidemia   . Hypertension   . Monoclonal gammopathies 02/25/2012  . Pedal edema   . Renal insufficiency   . Sleep apnea    uses cpap  . Tobacco abuse    Past Surgical History  Past Surgical History:  Procedure Laterality Date  . AV FISTULA PLACEMENT Right 01/10/2014   Procedure: ARTERIOVENOUS (AV) FISTULA CREATION- RIGHT RADIOCEPHALIC ;  Surgeon: Angelia Mould, MD;  Location: [place];  Service: Vascular;  Laterality: Right;   . COLONOSCOPY    . EYE SURGERY     cataract surgery  . Ganglion cyst removal x 2    . INSERTION OF DIALYSIS CATHETER N/A 01/07/2014   Procedure: INSERTION OF DIALYSIS CATHETER;  Surgeon: Conrad Whiteland, MD;  Location: [place];  Service: Vascular;  Laterality: N/A;  . Left knee arthroscopy with generalized joint synovectomy,  01/2006  . LIGATION OF COMPETING BRANCHES OF ARTERIOVENOUS FISTULA Right 05/28/2014   Procedure: LIGATION OF COMPETING BRANCHES OF ARTERIOVENOUS FISTULA;  Surgeon: Angelia Mould, MD;  Location: [place];  Service: Vascular;  Laterality: Right;  . REVISON OF ARTERIOVENOUS FISTULA Right 11/22/8414   Procedure: PLICATION REPAIR OF ARTERIOVENOUS FISTULA PSEUDOANEURYSM;  Surgeon: Serafina Mitchell, MD;  Location: [place];  Service: Vascular;  Laterality: Right;  . SHUNTOGRAM Right 05/14/2014   Procedure: FISTULOGRAM;  Surgeon: Serafina Mitchell, MD;  Location: [facility] CATH LAB;  Service: Cardiovascular;  Laterality: Right;   Family History  Family History  Problem Relation Age of Onset  . Diabetes Mother   . Heart disease Mother   . Lung cancer Father    Social History  reports that he quit smoking about 6 years ago. His smoking use included Cigarettes. He has a 9.90 pack-year smoking history.  He has never used smokeless tobacco. He reports that he does not drink alcohol or use drugs. Allergies  Allergies  Allergen Reactions  . Penicillins Nausea And Vomiting and Other (See Comments)    Has patient had a PCN reaction causing immediate rash, facial/tongue/throat swelling, SOB or lightheadedness with hypotension: No Has patient had a PCN reaction causing severe rash involving mucus membranes or skin necrosis: No Has patient had a PCN reaction that required hospitalization No Has patient had a PCN reaction occurring within the last 10 years: No If all of the above answers are "NO", then may proceed with Cephalosporin use.  . Vioxx [Rofecoxib] Nausea And Vomiting   Home  medications Prior to Admission medications   Medication Sig Start Date End Date Taking? Authorizing Provider  acetaminophen (TYLENOL) 500 MG tablet Take 1,000 mg by mouth every 6 (six) hours as needed for mild pain, moderate pain, fever or headache.    Yes Historical Provider, MD  allopurinol (ZYLOPRIM) 100 MG tablet Take 100 mg by mouth 2 (two) times daily.    Yes Historical Provider, MD  amLODipine (NORVASC) 5 MG tablet Take 5 mg by mouth daily.   Yes Historical Provider, MD  aspirin EC 81 MG tablet Take 81 mg by mouth daily.   Yes Historical Provider, MD  carvedilol (COREG) 6.25 MG tablet Take 1 tablet (6.25 mg total) by mouth 2 (two) times daily with a meal. 01/13/12  Yes Radene Gunning, NP  cinacalcet (SENSIPAR) 60 MG tablet Take 60 mg by mouth daily.   Yes Historical Provider, MD  ferric citrate (AURYXIA) 1 GM 210 MG(Fe) tablet Take 210 mg by mouth 3 (three) times daily with meals.   Yes Historical Provider, MD  furosemide (LASIX) 40 MG tablet Take 40 mg by mouth daily.   Yes Historical Provider, MD  gabapentin (NEURONTIN) 100 MG capsule Take 200 mg by mouth 2 (two) times daily.   Yes Historical Provider, MD  indomethacin (INDOCIN) 50 MG capsule Take 50 mg by mouth daily.    Yes Historical Provider, MD  Insulin Detemir (LEVEMIR FLEXTOUCH) 100 UNIT/ML Pen Inject 30 Units into the skin at bedtime.   Yes Historical Provider, MD  loratadine (CLARITIN) 10 MG tablet Take 10 mg by mouth daily as needed for allergies.   Yes Historical Provider, MD  NOVOLOG FLEXPEN 100 UNIT/ML FlexPen Inject 20 Units into the skin 3 (three) times daily with meals.    Yes Historical Provider, MD  pravastatin (PRAVACHOL) 40 MG tablet Take 40 mg by mouth daily.    Yes Historical Provider, MD   Liver Function Tests  Recent Labs Lab 03/04/17 0930  AST 49*  ALT 24  ALKPHOS 125  BILITOT 0.6  PROT 8.2*  ALBUMIN 2.9*   No results for input(s): LIPASE, AMYLASE in the last 168 hours. CBC  Recent Labs Lab  03/04/17 0930 03/04/17 1457  WBC 23.2* 19.1*  NEUTROABS 18.7*  --   HGB 11.2* 10.7*  HCT 34.8* 33.8*  MCV 78.2 77.3*  PLT 140* 174*   Basic Metabolic Panel  Recent Labs Lab 03/04/17 0930 03/04/17 1457  NA 132*  --   K 4.1  --   CL 89*  --   CO2 26  --   GLUCOSE 322*  --   BUN 44*  --   CREATININE 11.68* 11.96*  CALCIUM 8.5*  --    Iron/TIBC/Ferritin/ %Sat    Component Value Date/Time   IRON 30 (L) 01/05/2014 1225   TIBC 240 01/05/2014  1225   FERRITIN 98 01/05/2014 1225   IRONPCTSAT 13 (L) 01/05/2014 1225    Vitals:   03/04/17 1515 03/04/17 1516 03/04/17 1530 03/04/17 1600  BP:  117/73 128/89 140/79  Pulse: 92 94 92 96  Resp: (!) 37 (!) 33 (!) 26 (!) 34  Temp:      TempSrc:      SpO2: 94% 96% 92% 93%  Weight:      Height:       Exam Gen obese pleaseant AAM, tachypneic, but not in distress, in pain No rash, cyanosis or gangrene Sclera anicteric, throat clear  No jvd or bruits Chest clear bilat to bases RRR no MRG Abd soft ntnd no mass or ascites +bs obese GU penile/ scrotal edema 2-3+ with diffuse tenderness of penis and scrotum to palpation. No erythema , induration or fluctuance noted.  Did not cooperate with exam entirely.  MS no joint effusions or deformity Ext no LE edema.  RUA AVF +eschar over distal aspect of AVF where it is healing, no signs of infection Neuro is alert, Ox 3 , nf    Dialysis: MWF HD   Assessment: 1. Hypotension/ fevers - w/ tachypnea prob Duran/t sepsis. Sig scrotal pain and tenderness x 2 days. Possible GU infection.  Started on BS abx and urology consulted.   2. ESRD on HD MWF - had 2hr HD today, no good indication for HD at this time.  Will follow and hold off on HD hopefully until patient doing better.  3. Volume - no signs of pulm edema, CXR clear, no ^vol on exam 4. HTN - bp's soft, BP meds on hold 5. DM2 - on insulin, per primary 6. Anemia of CKD - get records 7. MBD of CKD - get records    Plan - nothing to recommend  at this time, will follow and reassess daily any need for HD.    Kelly Splinter MD [employer] pager [phone]   03/04/2017, 4:35 PM

## 2017-03-04 NOTE — ED Notes (Signed)
[facility] called for a bariatric bed for the patient and when it arrives, will call the Ed to transport patient.

## 2017-03-04 NOTE — ED Notes (Signed)
MD aware of I-STAT results

## 2017-03-04 NOTE — ED Notes (Signed)
Attempted to call report. Receiving nurse not available and will call back for report.

## 2017-03-04 NOTE — ED Provider Notes (Signed)
[facility] DEPT [facility] Provider Note   CSN: [id number] Arrival date & time: 03/04/17  0900     History   Chief Complaint Chief Complaint  Patient presents with  . Shortness of Breath    HPI [name] is a 61 y.o. male.  HPI Patient reports SOB onset shortly before dialysis. No assoc CP. Went to HD and only up 1.1 L. Dialysed without improvement. Placed on O2 but saturation was dropping down to 80s. Reports chronic LE edema (on exam L>R, patient believes this is chronic. Denies H/O DVT ) Reports newscrotal pain and swelling.  Past Medical History:  Diagnosis Date  . Alcohol abuse   . Congestive heart disease ([place]) 12/2011  . Diabetes mellitus   . Gout   . Hepatitis    being treated for Hepatitis C  . Hyperlipidemia   . Hyperlipidemia   . Hypertension   . Monoclonal gammopathies 02/25/2012  . Pedal edema   . Renal insufficiency   . Sleep apnea    uses cpap  . Tobacco abuse     Patient Active Problem List   Diagnosis Date Noted  . Acute respiratory failure with hypoxia ([place]) 03/04/2017  . Scrotal swelling 03/04/2017  . Chronic hepatitis C without hepatic coma ([place]) 11/17/2016  . Pulmonary nodule 04/02/2014  . Sleep apnea 04/02/2014  . Allergic rhinitis 04/02/2014  . End stage renal disease ([place]) 02/13/2014  . Pedal edema   . Monoclonal gammopathies 02/25/2012  . Obesity 01/10/2012  . Chronic kidney disease 01/10/2012  . Diabetes mellitus ([place]) 01/10/2012  . Hypertension   . Hyperlipidemia   . Gout   . Alcohol abuse   . Tobacco abuse     Past Surgical History:  Procedure Laterality Date  . AV FISTULA PLACEMENT Right 01/10/2014   Procedure: ARTERIOVENOUS (AV) FISTULA CREATION- RIGHT RADIOCEPHALIC ;  Surgeon: Angelia Mould, MD;  Location: [facility];  Service: Vascular;  Laterality: Right;  . COLONOSCOPY    . EYE SURGERY     cataract surgery  . Ganglion cyst removal x 2    . INSERTION OF DIALYSIS CATHETER N/A 01/07/2014   Procedure: INSERTION OF  DIALYSIS CATHETER;  Surgeon: Conrad Coronaca, MD;  Location: [place] [facility];  Service: Vascular;  Laterality: N/A;  . Left knee arthroscopy with generalized joint synovectomy,  01/2006  . LIGATION OF COMPETING BRANCHES OF ARTERIOVENOUS FISTULA Right 05/28/2014   Procedure: LIGATION OF COMPETING BRANCHES OF ARTERIOVENOUS FISTULA;  Surgeon: Angelia Mould, MD;  Location: [facility];  Service: Vascular;  Laterality: Right;  . REVISON OF ARTERIOVENOUS FISTULA Right 0/06/6760   Procedure: PLICATION REPAIR OF ARTERIOVENOUS FISTULA PSEUDOANEURYSM;  Surgeon: Serafina Mitchell, MD;  Location: [facility];  Service: Vascular;  Laterality: Right;  . SHUNTOGRAM Right 05/14/2014   Procedure: FISTULOGRAM;  Surgeon: Serafina Mitchell, MD;  Location: [facility] CATH LAB;  Service: Cardiovascular;  Laterality: Right;       Home Medications    Prior to Admission medications   Medication Sig Start Date End Date Taking? Authorizing Provider  acetaminophen (TYLENOL) 500 MG tablet Take 1,000 mg by mouth every 6 (six) hours as needed for mild pain, moderate pain, fever or headache.    Yes Historical Provider, MD  allopurinol (ZYLOPRIM) 100 MG tablet Take 100 mg by mouth 2 (two) times daily.    Yes Historical Provider, MD  amLODipine (NORVASC) 5 MG tablet Take 5 mg by mouth daily.   Yes Historical Provider, MD  aspirin EC 81 MG tablet Take  81 mg by mouth daily.   Yes Historical Provider, MD  carvedilol (COREG) 6.25 MG tablet Take 1 tablet (6.25 mg total) by mouth 2 (two) times daily with a meal. 01/13/12  Yes Radene Gunning, NP  cinacalcet (SENSIPAR) 60 MG tablet Take 60 mg by mouth daily.   Yes Historical Provider, MD  ferric citrate (AURYXIA) 1 GM 210 MG(Fe) tablet Take 210 mg by mouth 3 (three) times daily with meals.   Yes Historical Provider, MD  furosemide (LASIX) 40 MG tablet Take 40 mg by mouth daily.   Yes Historical Provider, MD  gabapentin (NEURONTIN) 100 MG capsule Take 200 mg by mouth 2 (two) times daily.   Yes Historical Provider, MD    indomethacin (INDOCIN) 50 MG capsule Take 50 mg by mouth daily.    Yes Historical Provider, MD  Insulin Detemir (LEVEMIR FLEXTOUCH) 100 UNIT/ML Pen Inject 30 Units into the skin at bedtime.   Yes Historical Provider, MD  loratadine (CLARITIN) 10 MG tablet Take 10 mg by mouth daily as needed for allergies.   Yes Historical Provider, MD  NOVOLOG FLEXPEN 100 UNIT/ML FlexPen Inject 20 Units into the skin 3 (three) times daily with meals.    Yes Historical Provider, MD  pravastatin (PRAVACHOL) 40 MG tablet Take 40 mg by mouth daily.    Yes Historical Provider, MD    Family History Family History  Problem Relation Age of Onset  . Diabetes Mother   . Heart disease Mother   . Lung cancer Father     Social History Social History  Substance Use Topics  . Smoking status: Former Smoker    Packs/day: 0.30    Years: 33.00    Types: Cigarettes    Quit date: 07/16/2010  . Smokeless tobacco: Never Used  . Alcohol use No     Allergies   Penicillins and Vioxx [rofecoxib]   Review of Systems Review of Systems 10 Systems reviewed and are negative for acute change except as noted in the HPI.  Physical Exam Updated Vital Signs BP (!) 102/50 (BP Location: Left Wrist)   Pulse 90   Temp 99.8 F (37.7 C) (Oral)   Resp 20   Ht 6' (1.829 m)   Wt (!) 342 lb 6.4 oz (155.3 kg)   SpO2 96%   BMI 46.44 kg/m   Physical Exam  Constitutional: He is oriented to person, place, and time.  Morbid obesity. Moderate I ncreased WOB with Tachypnea. Nontoxic alert.  HENT:  Head: Normocephalic and atraumatic.  Mouth/Throat: Oropharynx is clear and moist.  Eyes: EOM are normal. Pupils are equal, round, and reactive to light.  Neck: Neck supple.  Cardiovascular:  Distant HS, Regular.  Pulmonary/Chest:  Increased WOB with tachypnea. BS clear.  Abdominal:  Morbid central obesity. Nontender.  Genitourinary:  Genitourinary Comments: Scrotum not sig enlarged but posterior moderate thickened and tender.  Testicles mld tender.  Musculoskeletal:  LLE 2+edema nontender. RLE 1+edema nontender  Neurological: He is alert and oriented to person, place, and time. No cranial nerve deficit. He exhibits normal muscle tone. Coordination normal.  Skin: Skin is warm and dry.  Psychiatric: He has a normal mood and affect.     ED Treatments / Results  Labs (all labs ordered are listed, but only abnormal results are displayed) Labs Reviewed  BLOOD CULTURE ID PANEL (REFLEXED) - Abnormal; Notable for the following:       Result Value   Enterobacteriaceae species DETECTED (*)    Proteus species DETECTED (*)  All other components within normal limits  CBC - Abnormal; Notable for the following:    WBC 23.2 (*)    Hemoglobin 11.2 (*)    HCT 34.8 (*)    MCH 25.2 (*)    RDW 16.3 (*)    Platelets 140 (*)    All other components within normal limits  COMPREHENSIVE METABOLIC PANEL - Abnormal; Notable for the following:    Sodium 132 (*)    Chloride 89 (*)    Glucose, Bld 322 (*)    BUN 44 (*)    Creatinine, Ser 11.68 (*)    Calcium 8.5 (*)    Total Protein 8.2 (*)    Albumin 2.9 (*)    AST 49 (*)    GFR calc non Af Amer 4 (*)    GFR calc Af Amer 5 (*)    Anion gap 17 (*)    All other components within normal limits  PROTIME-INR - Abnormal; Notable for the following:    Prothrombin Time 15.3 (*)    All other components within normal limits  BLOOD GAS, ARTERIAL - Abnormal; Notable for the following:    pO2, Arterial 68.3 (*)    All other components within normal limits  DIFFERENTIAL - Abnormal; Notable for the following:    Neutro Abs 18.7 (*)    Monocytes Absolute 2.6 (*)    All other components within normal limits  CBC - Abnormal; Notable for the following:    WBC 19.1 (*)    Hemoglobin 10.7 (*)    HCT 33.8 (*)    MCV 77.3 (*)    MCH 24.5 (*)    RDW 16.4 (*)    Platelets 146 (*)    All other components within normal limits  CREATININE, SERUM - Abnormal; Notable for the following:      Creatinine, Ser 11.96 (*)    GFR calc non Af Amer 4 (*)    GFR calc Af Amer 5 (*)    All other components within normal limits  GLUCOSE, CAPILLARY - Abnormal; Notable for the following:    Glucose-Capillary 329 (*)    All other components within normal limits  GLUCOSE, CAPILLARY - Abnormal; Notable for the following:    Glucose-Capillary 359 (*)    All other components within normal limits  RENAL FUNCTION PANEL - Abnormal; Notable for the following:    Sodium 130 (*)    Chloride 87 (*)    Glucose, Bld 337 (*)    BUN 56 (*)    Creatinine, Ser 14.60 (*)    Phosphorus 7.9 (*)    Albumin 2.4 (*)    GFR calc non Af Amer 3 (*)    GFR calc Af Amer 4 (*)    Anion gap 21 (*)    All other components within normal limits  I-STAT CG4 LACTIC ACID, ED - Abnormal; Notable for the following:    Lactic Acid, Venous 3.30 (*)    All other components within normal limits  I-STAT CG4 LACTIC ACID, ED - Abnormal; Notable for the following:    Lactic Acid, Venous 1.94 (*)    All other components within normal limits  CBG MONITORING, ED - Abnormal; Notable for the following:    Glucose-Capillary 295 (*)    All other components within normal limits  CBG MONITORING, ED - Abnormal; Notable for the following:    Glucose-Capillary 249 (*)    All other components within normal limits  CULTURE, BLOOD (ROUTINE X 2)  CULTURE,  BLOOD (ROUTINE X 2)  MRSA PCR SCREENING  RESPIRATORY PANEL BY PCR  INFLUENZA PANEL BY PCR (TYPE A & B)  HIV ANTIBODY (ROUTINE TESTING)  LACTIC ACID, PLASMA  URINALYSIS, ROUTINE W REFLEX MICROSCOPIC  CBC  I-STAT TROPOININ, ED  I-STAT TROPOININ, ED  I-STAT CG4 LACTIC ACID, ED    EKG  EKG Interpretation None       Radiology Dg Chest 2 View  Result Date: 03/04/2017 CLINICAL DATA:  Increased shortness of breath and decreased O2 saturations. EXAM: CHEST  2 VIEW COMPARISON:  01/07/2014 FINDINGS: Azygos shadow and central vascular structures are mildly prominent. Findings  could be related to vascular congestion. There is no frank pulmonary edema. Heart size is within limits. No pleural effusions. No acute bone abnormality. Negative for a pneumothorax. IMPRESSION: Probable vascular congestion without overt pulmonary edema. Electronically Signed   By: Markus Daft M.D.   On: 03/04/2017 09:25   Ct Angio Chest Pe W/cm &/or Wo Cm  Result Date: 03/04/2017 CLINICAL DATA:  60 year old male with increased shortness of breath and hypoxia following dialysis earlier today. EXAM: CT ANGIOGRAPHY CHEST WITH CONTRAST TECHNIQUE: Multidetector CT imaging of the chest was performed using the standard protocol during bolus administration of intravenous contrast. Multiplanar CT image reconstructions and MIPs were obtained to evaluate the vascular anatomy. CONTRAST:  100 mL Isovue 370 COMPARISON:  Chest x-ray 03/04/2017; prior CT scan of the chest 07/09/2014 FINDINGS: Cardiovascular: Limited opacification of the pulmonary arteries secondary to transient physiologic interruption of contrast. No central or proximal or lobar PE. No evidence of aortic dissection or aneurysm. Aberrant right subclavian artery noticed incidentally. The heart is enlarged. No pericardial effusion. Mediastinum/Nodes: Unremarkable CT appearance of the thyroid gland. No suspicious mediastinal or hilar adenopathy. No soft tissue mediastinal mass. The thoracic esophagus is unremarkable. Lungs/Pleura: Respiratory motion artifact limits evaluation for small pulmonary nodules. No focal airspace consolidation, pleural effusion, pneumothorax or pulmonary edema. Mild dependent atelectasis present in both lower lobes. Upper Abdomen: Although the liver is incompletely evaluated, the left hepatic lobe and caudate lobe appear relatively hypertrophied which may suggest early cirrhotic change. No acute abnormalities within the upper abdomen. Musculoskeletal: No acute fracture or aggressive appearing lytic or blastic osseous lesion. Review of the  MIP images confirms the above findings. IMPRESSION: 1. No evidence of acute pulmonary embolus to the level of the proximal lobar arteries. Evaluation of the more distal pulmonary arterial tree is nondiagnostic secondary to a combination of relatively poor vessel opacification and respiratory motion artifact. 2. No evidence of pneumonia or other acute cardiopulmonary process. 3. Bibasilar subsegmental atelectasis. 4. Aberrant right subclavian artery. 5. Although incompletely imaged, the morphology of the liver suggests the possibility of cirrhotic change. Does the patient have clinical signs/symptoms of cirrhosis? Electronically Signed   By: Jacqulynn Cadet M.D.   On: 03/04/2017 12:31   US Scrotum  Result Date: 03/04/2017 CLINICAL DATA:  Scrotal pain and swelling. EXAM: SCROTAL ULTRASOUND DOPPLER ULTRASOUND OF THE TESTICLES TECHNIQUE: Complete ultrasound examination of the testicles, epididymis, and other scrotal structures was performed. Color and spectral Doppler ultrasound were also utilized to evaluate blood flow to the testicles. COMPARISON:  No recent prior. FINDINGS: Right testicle Measurements: 3.4 x 2.0 x 2.5 cm. No mass or microlithiasis visualized. Left testicle Measurements: 3.7 x 2.4 x 2.8 cm. 0.6 x 0.3 x 0.4 small area of increased echogenicity noted in the left testicle. Although this may represent a focal area of fat or a at left testicular tumor cannot be completely excluded.  No microlithiasis visualized . Right epididymis:  Tail of the epididymis is prominent. Left epididymis: Prominent epididymis with heterogeneous echotexture. 7 mm epididymal cyst. Hydrocele:  None visualized. Varicocele: Could not perform Valsalva maneuver due to patient's clinical condition. Bilateral scrotal skin thickening. Pulsed Doppler interrogation of both testes demonstrates normal low resistance arterial and venous waveforms bilaterally. IMPRESSION: 1. Cannot exclude bilateral epididymitis. 2. Small 0.6 cm area of  increased echogenicity noted in the left testicle. Although this may represent small area of fat a small left testicular tumor cannot be excluded no evidence of torsion. Electronically Signed   By: Marcello Moores  Register   On: 03/04/2017 11:32   [place] Art/ven Flow Abd Pelv Doppler  Result Date: 03/04/2017 CLINICAL DATA:  Scrotal pain and swelling. EXAM: SCROTAL ULTRASOUND DOPPLER ULTRASOUND OF THE TESTICLES TECHNIQUE: Complete ultrasound examination of the testicles, epididymis, and other scrotal structures was performed. Color and spectral Doppler ultrasound were also utilized to evaluate blood flow to the testicles. COMPARISON:  No recent prior. FINDINGS: Right testicle Measurements: 3.4 x 2.0 x 2.5 cm. No mass or microlithiasis visualized. Left testicle Measurements: 3.7 x 2.4 x 2.8 cm. 0.6 x 0.3 x 0.4 small area of increased echogenicity noted in the left testicle. Although this may represent a focal area of fat or a at left testicular tumor cannot be completely excluded. No microlithiasis visualized . Right epididymis:  Tail of the epididymis is prominent. Left epididymis: Prominent epididymis with heterogeneous echotexture. 7 mm epididymal cyst. Hydrocele:  None visualized. Varicocele: Could not perform Valsalva maneuver due to patient's clinical condition. Bilateral scrotal skin thickening. Pulsed Doppler interrogation of both testes demonstrates normal low resistance arterial and venous waveforms bilaterally. IMPRESSION: 1. Cannot exclude bilateral epididymitis. 2. Small 0.6 cm area of increased echogenicity noted in the left testicle. Although this may represent small area of fat a small left testicular tumor cannot be excluded no evidence of torsion. Electronically Signed   By: Marcello Moores  Register   On: 03/04/2017 11:32    Procedures Procedures (including critical care time) CRITICAL CARE Performed by: Charlesetta Shanks   Total critical care time: 60 minutes  Critical care time was exclusive of separately  billable procedures and treating other patients.  Critical care was necessary to treat or prevent imminent or life-threatening deterioration.  Critical care was time spent personally by me on the following activities: development of treatment plan with patient and/or surrogate as well as nursing, discussions with consultants, evaluation of patient's response to treatment, examination of patient, obtaining history from patient or surrogate, ordering and performing treatments and interventions, ordering and review of laboratory studies, ordering and review of radiographic studies, pulse oximetry and re-evaluation of patient's condition. Medications Ordered in ED Medications  piperacillin-tazobactam (ZOSYN) IVPB 3.375 g (3.375 g Intravenous New Bag/Given 03/05/17 1025)  allopurinol (ZYLOPRIM) tablet 100 mg (100 mg Oral Given 03/05/17 1026)  aspirin EC tablet 81 mg (81 mg Oral Given 03/05/17 1026)  carvedilol (COREG) tablet 6.25 mg (6.25 mg Oral Given 03/05/17 0846)  cinacalcet (SENSIPAR) tablet 60 mg (60 mg Oral Given 03/05/17 0846)  ferric citrate (AURYXIA) tablet 210 mg (210 mg Oral Given 03/05/17 0846)  gabapentin (NEURONTIN) capsule 200 mg (200 mg Oral Given 03/05/17 1026)  insulin aspart (novoLOG) injection 10 Units (10 Units Subcutaneous Given 03/05/17 0846)  pravastatin (PRAVACHOL) tablet 40 mg (40 mg Oral Given 03/04/17 2138)  heparin injection 5,000 Units (5,000 Units Subcutaneous Given 03/05/17 0641)  acetaminophen (TYLENOL) tablet 650 mg (650 mg Oral Given 03/05/17 0709)  Or  acetaminophen (TYLENOL) suppository 650 mg ( Rectal See Alternative 03/05/17 0709)  ondansetron (ZOFRAN) tablet 4 mg (not administered)    Or  ondansetron (ZOFRAN) injection 4 mg (not administered)  ketorolac (TORADOL) 30 MG/ML injection 30 mg (not administered)  zolpidem (AMBIEN) tablet 5 mg (not administered)  sorbitol 70 % solution 30 mL (not administered)  docusate sodium (ENEMEEZ) enema 283 mg (not administered)    camphor-menthol (SARNA) lotion 1 application (not administered)    And  hydrOXYzine (ATARAX/VISTARIL) tablet 25 mg (not administered)  calcium carbonate (dosed in mg elemental calcium) suspension 500 mg of elemental calcium (not administered)  feeding supplement (NEPRO CARB STEADY) liquid 237 mL (not administered)  insulin detemir (LEVEMIR) injection 20 Units (not administered)  ipratropium-albuterol (DUONEB) 0.5-2.5 (3) MG/3ML nebulizer solution 3 mL (3 mLs Nebulization Given 03/04/17 0939)  sodium chloride 0.9 % bolus 1,000 mL (0 mLs Intravenous Stopped 03/04/17 1302)  piperacillin-tazobactam (ZOSYN) IVPB 3.375 g (0 g Intravenous Stopped 03/04/17 1114)  vancomycin (VANCOCIN) 2,500 mg in sodium chloride 0.9 % 500 mL IVPB (0 mg Intravenous Stopped 03/04/17 1339)  ondansetron (ZOFRAN) injection 4 mg (4 mg Intravenous Given 03/04/17 1100)  sodium chloride 0.9 % bolus 1,000 mL (0 mLs Intravenous Stopped 03/04/17 1302)  iopamidol (ISOVUE-370) 76 % injection 100 mL (100 mLs Intravenous Contrast Given 03/04/17 1154)  insulin aspart (novoLOG) injection 10 Units (10 Units Subcutaneous Given 03/04/17 1303)     Initial Impression / Assessment and Plan / ED Course  I have reviewed the triage vital signs and the nursing notes.  Pertinent labs & imaging results that were available during my care of the patient were reviewed by me and considered in my medical decision making (see chart for details).  Clinical Course as of Mar 06 1115  Fri Mar 04, 2017  1009 Lactic acid elevated and hypotension documented. Sepsis protocol initiated. DDx scrotal infection, PE, or cardiac ischemia. Will consult intensivist.  [MP]  1330 Dr. Beatrix Fetters of urology consulted regarding scrotal swelling as possible source of sepsis. OK to proceed with transfer to [facility] and he will see the patient in consult.  [MP]    Clinical Course User Index [MP] Charlesetta Shanks, MD   Consult: Intensivist service. Consults: [facility]  hospitalist  Final Clinical Impressions(s) / ED Diagnoses   Final diagnoses:  Sepsis, due to unspecified organism ([place])  ESRD (end stage renal disease) on dialysis ([place])  Severe comorbid illness  Patient presented with significantly increasing shortness of breath despite being dialyzed and not having significant volume overload at that time. Differential diagnosis included sepsis, PE, cardiac ischemic event. PE ruled out by CT. Sepsis protocol initiated. Patient did have positive response to treatment and was showing signs of improvement at time of admission. Source of sepsis uncertain however patient did have new tender area in scrotum, urology consult. Also report of poorly healing fistula but no erythema or swelling. Patient has multiple risk factors and severe comorbid illness.  New Prescriptions Current Discharge Medication List       Charlesetta Shanks, MD 03/05/17 1119

## 2017-03-04 NOTE — ED Notes (Signed)
Ice to the scrotum.

## 2017-03-04 NOTE — Progress Notes (Addendum)
Pharmacy Antibiotic Note  [name] is a 61 y.o. male admitted on 03/04/2017 with sepsis.  Pharmacy has been consulted for vancomycin and Zosyn dosing.  Plan:  Zosyn 3.375 gr IV q12h over 4 hours ( HD dosing )   Vancomycin 2500 mg IV x1   Will order further vancomycin doses pending scheduling of HD/possible transfer to [facility]  Monitor clinical course, renal function, cultures as available  Height: 6' (182.9 cm) Weight: (!) 343 lb 14.7 oz (156 kg) IBW/kg (Calculated) : 77.6  Temp (24hrs), Avg:99.2 F (37.3 C), Min:98.5 F (36.9 C), Max:100.3 F (37.9 C)   Recent Labs Lab 03/04/17 0930 03/04/17 0953  WBC 23.2*  --   CREATININE 11.68*  --   LATICACIDVEN  --  3.30*    Estimated Creatinine Clearance: 10.5 mL/min (A) (by C-G formula based on SCr of 11.68 mg/dL (H)).    Allergies  Allergen Reactions  . Penicillins Nausea And Vomiting and Other (See Comments)    Has patient had a PCN reaction causing immediate rash, facial/tongue/throat swelling, SOB or lightheadedness with hypotension: No Has patient had a PCN reaction causing severe rash involving mucus membranes or skin necrosis: No Has patient had a PCN reaction that required hospitalization No Has patient had a PCN reaction occurring within the last 10 years: No If all of the above answers are "NO", then may proceed with Cephalosporin use.  . Vioxx [Rofecoxib] Nausea And Vomiting    Antimicrobials this admission: 4/20 vancomycin >>  4/20 Zosyn >>   Dose adjustments this admission: ---  Microbiology results: 4/20 BCx: sent 4/20 UCx: sent   Thank you for allowing pharmacy to be a part of this patient's care.   Royetta Asal, PharmD, BCPS Pager [phone] 03/04/2017 1:20 PM

## 2017-03-04 NOTE — ED Notes (Signed)
I called to enquire about why bed was not made ready and was told it was ready a long time ago and it was not clicked off, She thought it was tho.

## 2017-03-04 NOTE — ED Notes (Signed)
Pt is not able to urinate. 

## 2017-03-04 NOTE — ED Notes (Signed)
CODE SEPSIS CALLED BY SHEILA R.N.

## 2017-03-04 NOTE — Consult Note (Signed)
Urology Consult  Consulting MD: Wynelle Cleveland  CC: scrotal swelling, fever  HPI: This is a 61 year old male admitted to the medical service for fever, probable infection.  The patient has been on long-term hemodialysis. He has gout, congestive heart disease, diabetes mellitus,history of hepatitis C, hypertension, hyperlipidemia, sleep apnea.  He has recently developed fever, and has also had diarrhea. He presented to the emergency room with dyspnea as well as his fever.  He had dialysis earlier today.  On examination, the patient is noted to have significant scrotal edema and tenderness. He denies any recent scrotal trauma.  He denies prior history of scrotal swelling or infections.  He does not make urine. Urologic consultation is requested.  PMH: Past Medical History:  Diagnosis Date  . Alcohol abuse   . Congestive heart disease ([place]) 12/2011  . Diabetes mellitus   . Gout   . Hepatitis    being treated for Hepatitis C  . Hyperlipidemia   . Hyperlipidemia   . Hypertension   . Monoclonal gammopathies 02/25/2012  . Pedal edema   . Renal insufficiency   . Sleep apnea    uses cpap  . Tobacco abuse     PSH: Past Surgical History:  Procedure Laterality Date  . AV FISTULA PLACEMENT Right 01/10/2014   Procedure: ARTERIOVENOUS (AV) FISTULA CREATION- RIGHT RADIOCEPHALIC ;  Surgeon: Angelia Mould, MD;  Location: [facility];  Service: Vascular;  Laterality: Right;  . COLONOSCOPY    . EYE SURGERY     cataract surgery  . Ganglion cyst removal x 2    . INSERTION OF DIALYSIS CATHETER N/A 01/07/2014   Procedure: INSERTION OF DIALYSIS CATHETER;  Surgeon: Conrad Christian, MD;  Location: [facility];  Service: Vascular;  Laterality: N/A;  . Left knee arthroscopy with generalized joint synovectomy,  01/2006  . LIGATION OF COMPETING BRANCHES OF ARTERIOVENOUS FISTULA Right 05/28/2014   Procedure: LIGATION OF COMPETING BRANCHES OF ARTERIOVENOUS FISTULA;  Surgeon: Angelia Mould, MD;  Location: [facility];   Service: Vascular;  Laterality: Right;  . REVISON OF ARTERIOVENOUS FISTULA Right 4/0/9811   Procedure: PLICATION REPAIR OF ARTERIOVENOUS FISTULA PSEUDOANEURYSM;  Surgeon: Serafina Mitchell, MD;  Location: [facility];  Service: Vascular;  Laterality: Right;  . SHUNTOGRAM Right 05/14/2014   Procedure: FISTULOGRAM;  Surgeon: Serafina Mitchell, MD;  Location: [facility] CATH LAB;  Service: Cardiovascular;  Laterality: Right;    Allergies: Allergies  Allergen Reactions  . Penicillins Nausea And Vomiting and Other (See Comments)    Has patient had a PCN reaction causing immediate rash, facial/tongue/throat swelling, SOB or lightheadedness with hypotension: No Has patient had a PCN reaction causing severe rash involving mucus membranes or skin necrosis: No Has patient had a PCN reaction that required hospitalization No Has patient had a PCN reaction occurring within the last 10 years: No If all of the above answers are "NO", then may proceed with Cephalosporin use.  . Vioxx [Rofecoxib] Nausea And Vomiting    Medications:  (Not in a hospital admission)   Social History: Social History   Social History  . Marital status: Married    Spouse name: [name]  . Number of children: 2  . Years of education: 12   Occupational History  . Security    Social History Main Topics  . Smoking status: Former Smoker    Packs/day: 0.30    Years: 33.00    Types: Cigarettes    Quit date: 07/16/2010  . Smokeless tobacco: Never Used  .  Alcohol use No  . Drug use: No  . Sexual activity: No   Other Topics Concern  . Not on file   Social History Narrative   Married, lives in [place] with wife.    Family History: Family History  Problem Relation Age of Onset  . Diabetes Mother   . Heart disease Mother   . Lung cancer Father     Review of Systems: Positive: scrotal/penile swelling, scrotal tenderness, dyspnea, fever, diarrhea, nausea and vomiting.  Fatigue. Negative:   A further 10 point review of systems was  negative except what is listed in the HPI.  Physical Exam: @VITALS2 @ General: No acute distress.  Awake. Head:  Normocephalic.  Atraumatic. ENT:  EOMI.  Mucous membranes moist Neck:  Supple.  No lymphadenopathy. CV:  Tachycardia. Pulmonary: Patient is tachypnea. Abdomen: Soft.  Morbidly obese, non-tender to palpation. Skin:  Normal turgor.  No visible rash. Extremity: Significant bilateral lower extremity edema. Neurologic: Alert. Appropriate mood. Rectal:             Large buttocks precluded full rectal exam.  However, there is no evidence of perianal                          swelling/tenderness/perirectal abscess.  Penis:  Uncircumcised.  No lesions, but the patient has significant penile edema.  No                                     discrete lesion noted, no tenderness.  No erythema.Marland Kitchen Urethra: I was unable to retract the foreskin to evaluate the patient's glans/urethra Scrotum: Significant generalized edema.  There is no crepitus.  There is generalized                                         tenderness with more tenderness posteriorly.  I was unable to palpate or visualize                               any lesions. Testicles:  nonpalpable as I could not palpate through the edematous scrotal wall. Epididymis: nonpalpable  Studies:  Recent Labs     03/04/17  0930  03/04/17  1457  HGB  11.2*  10.7*  WBC  23.2*  19.1*  PLT  140*  146*    Recent Labs     03/04/17  0930  03/04/17  1457  NA  132*   --   K  4.1   --   CL  89*   --   CO2  26   --   BUN  44*   --   CREATININE  11.68*  11.96*  CALCIUM  8.5*   --   GFRNONAA  4*  4*  GFRAA  5*  5*     Recent Labs     03/04/17  0930  INR  1.21    I reviewed the patient's scrotal ultrasound.  There is significant scrotal wall edema.  No discrete abscess noted.  Small intratesticular abnormality in the patient's left testicle is, to me, clinically insignificant. Invalid input(s): ABG    Assessment:  Scrotal process,  possibly epididymitis or, more likely skin infection.  There is associated scrotal wall edema as  well as penile edema.  I do not see a forming abscess at this point, but this may be early in the patient's course.  I am not suspicious of Fournier's gangrene at this point, as all of the skin seems to be viable and there is no crepitus.  Plan: I agree with aggressive IV antibiotic management.  I will make sure the scrotum is elevated  He will need serial GU examinations, and if an abscess starts pointing, incision and drainage.    Pager:[phone]

## 2017-03-04 NOTE — ED Triage Notes (Signed)
Patient stated he had increased SOB prior to dialysis this AM. Dialysis started and patient had increased SOB. Patient wanted to have family bring him to the ED. Patient also c/o increased swelling to scrotum. Room air sats on arrival 84%.

## 2017-03-04 NOTE — ED Notes (Signed)
Meredith/respiratory therapy notified of order for CPAP and patient falls asleep during the day.

## 2017-03-04 NOTE — Progress Notes (Signed)
New Admission Note:   Arrival Method: From [facility] ED via stretcher Mental Orientation: Alert and oriented x4 Telemetry: N/A Assessment: Completed Skin: See doc flowsheet IV: L  FA- NSL                                                                                                                   Pain: Denies Tubes: N/A Safety Measures: Safety Fall Prevention Plan has been given, discussed and signed Admission: Completed [facility] Orientation: Patient has been orientated to the room, unit and staff.  Family:  Orders have been reviewed and implemented. Will continue to monitor the patient. Call light has been placed within reach and bed alarm has been activated.   [employer], RN-BC Phone number: [phone]

## 2017-03-04 NOTE — ED Notes (Signed)
Attempted x 2 to call report to [facility] at [facility]. No one came to the phone for report.

## 2017-03-04 NOTE — Progress Notes (Signed)
Patient has a temp of 103.1,MD on call notified. Darshana Curnutt, Wonda Cheng, Therapist, sports

## 2017-03-05 DIAGNOSIS — A419 Sepsis, unspecified organism: Secondary | ICD-10-CM

## 2017-03-05 DIAGNOSIS — N5089 Other specified disorders of the male genital organs: Secondary | ICD-10-CM

## 2017-03-05 DIAGNOSIS — N5082 Scrotal pain: Secondary | ICD-10-CM

## 2017-03-05 DIAGNOSIS — Z992 Dependence on renal dialysis: Secondary | ICD-10-CM

## 2017-03-05 DIAGNOSIS — Z794 Long term (current) use of insulin: Secondary | ICD-10-CM

## 2017-03-05 DIAGNOSIS — E1121 Type 2 diabetes mellitus with diabetic nephropathy: Secondary | ICD-10-CM

## 2017-03-05 DIAGNOSIS — N186 End stage renal disease: Secondary | ICD-10-CM

## 2017-03-05 DIAGNOSIS — E785 Hyperlipidemia, unspecified: Secondary | ICD-10-CM

## 2017-03-05 LAB — RESPIRATORY PANEL BY PCR
ADENOVIRUS-RVPPCR: NOT DETECTED
Bordetella pertussis: NOT DETECTED
CORONAVIRUS 229E-RVPPCR: NOT DETECTED
CORONAVIRUS HKU1-RVPPCR: NOT DETECTED
CORONAVIRUS OC43-RVPPCR: NOT DETECTED
Chlamydophila pneumoniae: NOT DETECTED
Coronavirus NL63: NOT DETECTED
Influenza A: NOT DETECTED
Influenza B: NOT DETECTED
METAPNEUMOVIRUS-RVPPCR: NOT DETECTED
Mycoplasma pneumoniae: NOT DETECTED
PARAINFLUENZA VIRUS 1-RVPPCR: NOT DETECTED
PARAINFLUENZA VIRUS 2-RVPPCR: NOT DETECTED
Parainfluenza Virus 3: NOT DETECTED
Parainfluenza Virus 4: NOT DETECTED
RHINOVIRUS / ENTEROVIRUS - RVPPCR: NOT DETECTED
Respiratory Syncytial Virus: NOT DETECTED

## 2017-03-05 LAB — BLOOD CULTURE ID PANEL (REFLEXED)

## 2017-03-05 LAB — RENAL FUNCTION PANEL
Albumin: 2.4 g/dL — ABNORMAL LOW (ref 3.5–5.0)
Anion gap: 21 — ABNORMAL HIGH (ref 5–15)
BUN: 56 mg/dL — ABNORMAL HIGH (ref 6–20)
CO2: 22 mmol/L (ref 22–32)
Calcium: 9.3 mg/dL (ref 8.9–10.3)
Chloride: 87 mmol/L — ABNORMAL LOW (ref 101–111)
Creatinine, Ser: 14.6 mg/dL — ABNORMAL HIGH (ref 0.61–1.24)
GFR calc Af Amer: 4 mL/min — ABNORMAL LOW (ref >60)
GFR calc non Af Amer: 3 mL/min — ABNORMAL LOW (ref >60)
Glucose, Bld: 337 mg/dL — ABNORMAL HIGH (ref 65–99)
Phosphorus: 7.9 mg/dL — ABNORMAL HIGH (ref 2.5–4.6)
Potassium: 5 mmol/L (ref 3.5–5.1)
Sodium: 130 mmol/L — ABNORMAL LOW (ref 135–145)

## 2017-03-05 LAB — GLUCOSE, CAPILLARY
GLUCOSE-CAPILLARY: 298 mg/dL — AB (ref 65–99)
GLUCOSE-CAPILLARY: 264 mg/dL — AB (ref 65–99)
GLUCOSE-CAPILLARY: 313 mg/dL — AB (ref 65–99)
Glucose-Capillary: 359 mg/dL — ABNORMAL HIGH (ref 65–99)

## 2017-03-05 LAB — MRSA PCR SCREENING: MRSA by PCR: NEGATIVE

## 2017-03-05 LAB — HIV ANTIBODY (ROUTINE TESTING W REFLEX): HIV Screen 4th Generation wRfx: NONREACTIVE

## 2017-03-05 MED ORDER — WHITE PETROLATUM GEL
Status: AC
  Filled 2017-03-05: qty 1

## 2017-03-05 MED ORDER — CAMPHOR-MENTHOL 0.5-0.5 % EX LOTN
1.0000 "application " | TOPICAL_LOTION | Freq: Three times a day (TID) | CUTANEOUS | Status: DC | PRN
  Filled 2017-03-05: qty 222

## 2017-03-05 MED ORDER — ONDANSETRON HCL 4 MG/2ML IJ SOLN: 4.0000 mg | Freq: Four times a day (QID) | INTRAMUSCULAR | Status: DC | PRN

## 2017-03-05 MED ORDER — ZOLPIDEM TARTRATE 5 MG PO TABS
5.0000 mg | ORAL_TABLET | Freq: Every evening | ORAL | Status: DC | PRN
  Administered 2017-03-08 – 2017-03-11 (×2): 5 mg via ORAL
  Filled 2017-03-05 (×3): qty 1

## 2017-03-05 MED ORDER — ONDANSETRON HCL 4 MG PO TABS: 4.0000 mg | ORAL_TABLET | Freq: Four times a day (QID) | ORAL | Status: DC | PRN

## 2017-03-05 MED ORDER — INSULIN DETEMIR 100 UNIT/ML ~~LOC~~ SOLN
20.0000 [IU] | Freq: Every day | SUBCUTANEOUS | Status: DC
  Administered 2017-03-05: 20 [IU] via SUBCUTANEOUS
  Filled 2017-03-05 (×2): qty 0.2

## 2017-03-05 MED ORDER — SORBITOL 70 % SOLN: 30.0000 mL | Status: DC | PRN

## 2017-03-05 MED ORDER — ACETAMINOPHEN 325 MG PO TABS: 650.0000 mg | ORAL_TABLET | Freq: Four times a day (QID) | ORAL | Status: DC | PRN

## 2017-03-05 MED ORDER — CALCIUM CARBONATE ANTACID 1250 MG/5ML PO SUSP
500.0000 mg | Freq: Four times a day (QID) | ORAL | Status: DC | PRN
  Filled 2017-03-05: qty 5

## 2017-03-05 MED ORDER — NEPRO/CARBSTEADY PO LIQD: 237.0000 mL | Freq: Three times a day (TID) | ORAL | Status: DC | PRN

## 2017-03-05 MED ORDER — DEXTROSE 5 % IV SOLN
2.0000 g | INTRAVENOUS | Status: DC
  Administered 2017-03-05 – 2017-03-07 (×2): 2 g via INTRAVENOUS
  Filled 2017-03-05 (×3): qty 2

## 2017-03-05 MED ORDER — ACETAMINOPHEN 650 MG RE SUPP: 650.0000 mg | Freq: Four times a day (QID) | RECTAL | Status: DC | PRN

## 2017-03-05 MED ORDER — DOCUSATE SODIUM 283 MG RE ENEM
1.0000 | ENEMA | RECTAL | Status: DC | PRN
  Filled 2017-03-05: qty 1

## 2017-03-05 MED ORDER — HYDROXYZINE HCL 25 MG PO TABS: 25.0000 mg | ORAL_TABLET | Freq: Three times a day (TID) | ORAL | Status: DC | PRN

## 2017-03-05 MED ORDER — VANCOMYCIN HCL IN DEXTROSE 750-5 MG/150ML-% IV SOLN
750.0000 mg | INTRAVENOUS | Status: AC
  Administered 2017-03-05: 750 mg via INTRAVENOUS
  Filled 2017-03-05: qty 150

## 2017-03-05 NOTE — Progress Notes (Signed)
Subjective: Patient reports continued scrotal pain. Fever to 103 this am.  Objective: Vital signs in last 24 hours: Temp:  [99.8 F (37.7 C)-103.1 F (39.5 C)] 99.8 F (37.7 C) (04/21 1000) Pulse Rate:  [72-103] 90 (04/21 1000) Resp:  [17-39] 20 (04/21 1000) BP: (94-162)/(47-89) 102/50 (04/21 1000) SpO2:  [91 %-100 %] 96 % (04/21 1000) Weight:  [155.3 kg (342 lb 6.4 oz)] 155.3 kg (342 lb 6.4 oz) (04/20 2214)  Intake/Output from previous day: 04/20 0701 - 04/21 0700 In: 2900 [P.O.:300; IV Piggyback:2600] Out: 1 [Stool:1] Intake/Output this shift: Total I/O In: 240 [P.O.:240] Out: -   Physical Exam:  Constitutional: Vital signs reviewed. WD WN in NAD   Eyes: PERRL, No scleral icterus.   Pulmonary/Chest: Normal effort Genitourinary: Penile edema somewhat worse. Scrotal edema/tenderness appears about the same. Posterior scrotal skin erosion 1.5 cm. No fluctuance or crepitus on exam. Extremities: No cyanosis or edema   Lab Results:  Recent Labs  03/04/17 0930 03/04/17 1457  HGB 11.2* 10.7*  HCT 34.8* 33.8*   BMET  Recent Labs  03/04/17 0930 03/04/17 1457 03/05/17 0900  NA 132*  --  130*  K 4.1  --  5.0  CL 89*  --  87*  CO2 26  --  22  GLUCOSE 322*  --  337*  BUN 44*  --  56*  CREATININE 11.68* 11.96* 14.60*  CALCIUM 8.5*  --  9.3    Recent Labs  03/04/17 0930  INR 1.21   No results for input(s): LABURIN in the last 72 hours. Results for orders placed or performed during the hospital encounter of 03/04/17  Culture, blood (routine x 2)     Status: None (Preliminary result)   Collection Time: 03/04/17  9:27 AM  Result Value Ref Range Status   Specimen Description BLOOD LEFT ANTECUBITAL  Final   Special Requests   Final    BOTTLES DRAWN AEROBIC AND ANAEROBIC Blood Culture adequate volume   Culture   Final    NO GROWTH < 24 HOURS Performed at [facility], [place] [address], [place], [place] [address]    Report Status PENDING  Incomplete   Culture, blood (routine x 2)     Status: None (Preliminary result)   Collection Time: 03/04/17 10:31 AM  Result Value Ref Range Status   Specimen Description BLOOD LEFT HAND  Final   Special Requests   Final    IN PEDIATRIC BOTTLE Blood Culture results may not be optimal due to an excessive volume of blood received in culture bottles   Culture  Setup Time   Final    GRAM NEGATIVE RODS IN PEDIATRIC BOTTLE Organism ID to follow CRITICAL RESULT CALLED TO, READ BACK BY AND VERIFIED WITH: R RUMBARGER 03/05/17 @ 0914 M VESTAL Performed at [facility], [place] [address] [place], [place] [address]    Culture GRAM NEGATIVE RODS  Final   Report Status PENDING  Incomplete  Blood Culture ID Panel (Reflexed)     Status: Abnormal   Collection Time: 03/04/17 10:31 AM  Result Value Ref Range Status   Enterococcus species NOT DETECTED NOT DETECTED Final   Listeria monocytogenes NOT DETECTED NOT DETECTED Final   Staphylococcus species NOT DETECTED NOT DETECTED Final   Staphylococcus aureus NOT DETECTED NOT DETECTED Final   Streptococcus species NOT DETECTED NOT DETECTED Final   Streptococcus agalactiae NOT DETECTED NOT DETECTED Final   Streptococcus pneumoniae NOT DETECTED NOT DETECTED Final   Streptococcus pyogenes NOT  DETECTED NOT DETECTED Final   Acinetobacter baumannii NOT DETECTED NOT DETECTED Final   Enterobacteriaceae species DETECTED (A) NOT DETECTED Final    Comment: Enterobacteriaceae represent a large family of gram-negative bacteria, not a single organism. CRITICAL RESULT CALLED TO, READ BACK BY AND VERIFIED WITH: R RUMBARGER,PHARMD AT 0914 03/05/17 BY L BENFIELD    Enterobacter cloacae complex NOT DETECTED NOT DETECTED Final   Escherichia coli NOT DETECTED NOT DETECTED Final   Klebsiella oxytoca NOT DETECTED NOT DETECTED Final   Klebsiella pneumoniae NOT DETECTED NOT DETECTED Final   Proteus species DETECTED (A) NOT DETECTED Final    Comment: CRITICAL RESULT CALLED TO, READ BACK  BY AND VERIFIED WITH: R RUMBARGER,PHARMD AT 0914 03/05/17 BY M VESTAL    Serratia marcescens NOT DETECTED NOT DETECTED Final   Carbapenem resistance NOT DETECTED NOT DETECTED Final   Haemophilus influenzae NOT DETECTED NOT DETECTED Final   Neisseria meningitidis NOT DETECTED NOT DETECTED Final   Pseudomonas aeruginosa NOT DETECTED NOT DETECTED Final   Candida albicans NOT DETECTED NOT DETECTED Final   Candida glabrata NOT DETECTED NOT DETECTED Final   Candida krusei NOT DETECTED NOT DETECTED Final   Candida parapsilosis NOT DETECTED NOT DETECTED Final   Candida tropicalis NOT DETECTED NOT DETECTED Final    Comment: Performed at [facility], [place] [address], [place], [place] [address]  MRSA PCR Screening     Status: None   Collection Time: 03/04/17  9:50 PM  Result Value Ref Range Status   MRSA by PCR NEGATIVE NEGATIVE Final    Comment:        The GeneXpert MRSA Assay (FDA approved for NASAL specimens only), is one component of a comprehensive MRSA colonization surveillance program. It is not intended to diagnose MRSA infection nor to guide or monitor treatment for MRSA infections.     Studies/Results: Dg Chest 2 View  Result Date: 03/04/2017 CLINICAL DATA:  Increased shortness of breath and decreased O2 saturations. EXAM: CHEST  2 VIEW COMPARISON:  01/07/2014 FINDINGS: Azygos shadow and central vascular structures are mildly prominent. Findings could be related to vascular congestion. There is no frank pulmonary edema. Heart size is within limits. No pleural effusions. No acute bone abnormality. Negative for a pneumothorax. IMPRESSION: Probable vascular congestion without overt pulmonary edema. Electronically Signed   By: Markus Daft M.D.   On: 03/04/2017 09:25   Ct Angio Chest Pe W/cm &/or Wo Cm  Result Date: 03/04/2017 CLINICAL DATA:  61 year old male with increased shortness of breath and hypoxia following dialysis earlier today. EXAM: CT ANGIOGRAPHY CHEST WITH CONTRAST  TECHNIQUE: Multidetector CT imaging of the chest was performed using the standard protocol during bolus administration of intravenous contrast. Multiplanar CT image reconstructions and MIPs were obtained to evaluate the vascular anatomy. CONTRAST:  100 mL Isovue 370 COMPARISON:  Chest x-ray 03/04/2017; prior CT scan of the chest 07/09/2014 FINDINGS: Cardiovascular: Limited opacification of the pulmonary arteries secondary to transient physiologic interruption of contrast. No central or proximal or lobar PE. No evidence of aortic dissection or aneurysm. Aberrant right subclavian artery noticed incidentally. The heart is enlarged. No pericardial effusion. Mediastinum/Nodes: Unremarkable CT appearance of the thyroid gland. No suspicious mediastinal or hilar adenopathy. No soft tissue mediastinal mass. The thoracic esophagus is unremarkable. Lungs/Pleura: Respiratory motion artifact limits evaluation for small pulmonary nodules. No focal airspace consolidation, pleural effusion, pneumothorax or pulmonary edema. Mild dependent atelectasis present in both lower lobes. Upper Abdomen: Although the liver is incompletely evaluated, the left hepatic lobe  and caudate lobe appear relatively hypertrophied which may suggest early cirrhotic change. No acute abnormalities within the upper abdomen. Musculoskeletal: No acute fracture or aggressive appearing lytic or blastic osseous lesion. Review of the MIP images confirms the above findings. IMPRESSION: 1. No evidence of acute pulmonary embolus to the level of the proximal lobar arteries. Evaluation of the more distal pulmonary arterial tree is nondiagnostic secondary to a combination of relatively poor vessel opacification and respiratory motion artifact. 2. No evidence of pneumonia or other acute cardiopulmonary process. 3. Bibasilar subsegmental atelectasis. 4. Aberrant right subclavian artery. 5. Although incompletely imaged, the morphology of the liver suggests the possibility  of cirrhotic change. Does the patient have clinical signs/symptoms of cirrhosis? Electronically Signed   By: Jacqulynn Cadet M.D.   On: 03/04/2017 12:31   US Scrotum  Result Date: 03/04/2017 CLINICAL DATA:  Scrotal pain and swelling. EXAM: SCROTAL ULTRASOUND DOPPLER ULTRASOUND OF THE TESTICLES TECHNIQUE: Complete ultrasound examination of the testicles, epididymis, and other scrotal structures was performed. Color and spectral Doppler ultrasound were also utilized to evaluate blood flow to the testicles. COMPARISON:  No recent prior. FINDINGS: Right testicle Measurements: 3.4 x 2.0 x 2.5 cm. No mass or microlithiasis visualized. Left testicle Measurements: 3.7 x 2.4 x 2.8 cm. 0.6 x 0.3 x 0.4 small area of increased echogenicity noted in the left testicle. Although this may represent a focal area of fat or a at left testicular tumor cannot be completely excluded. No microlithiasis visualized . Right epididymis:  Tail of the epididymis is prominent. Left epididymis: Prominent epididymis with heterogeneous echotexture. 7 mm epididymal cyst. Hydrocele:  None visualized. Varicocele: Could not perform Valsalva maneuver due to patient's clinical condition. Bilateral scrotal skin thickening. Pulsed Doppler interrogation of both testes demonstrates normal low resistance arterial and venous waveforms bilaterally. IMPRESSION: 1. Cannot exclude bilateral epididymitis. 2. Small 0.6 cm area of increased echogenicity noted in the left testicle. Although this may represent small area of fat a small left testicular tumor cannot be excluded no evidence of torsion. Electronically Signed   By: Marcello Moores  Register   On: 03/04/2017 11:32   [place] Art/ven Flow Abd Pelv Doppler  Result Date: 03/04/2017 CLINICAL DATA:  Scrotal pain and swelling. EXAM: SCROTAL ULTRASOUND DOPPLER ULTRASOUND OF THE TESTICLES TECHNIQUE: Complete ultrasound examination of the testicles, epididymis, and other scrotal structures was performed. Color and  spectral Doppler ultrasound were also utilized to evaluate blood flow to the testicles. COMPARISON:  No recent prior. FINDINGS: Right testicle Measurements: 3.4 x 2.0 x 2.5 cm. No mass or microlithiasis visualized. Left testicle Measurements: 3.7 x 2.4 x 2.8 cm. 0.6 x 0.3 x 0.4 small area of increased echogenicity noted in the left testicle. Although this may represent a focal area of fat or a at left testicular tumor cannot be completely excluded. No microlithiasis visualized . Right epididymis:  Tail of the epididymis is prominent. Left epididymis: Prominent epididymis with heterogeneous echotexture. 7 mm epididymal cyst. Hydrocele:  None visualized. Varicocele: Could not perform Valsalva maneuver due to patient's clinical condition. Bilateral scrotal skin thickening. Pulsed Doppler interrogation of both testes demonstrates normal low resistance arterial and venous waveforms bilaterally. IMPRESSION: 1. Cannot exclude bilateral epididymitis. 2. Small 0.6 cm area of increased echogenicity noted in the left testicle. Although this may represent small area of fat a small left testicular tumor cannot be excluded no evidence of torsion. Electronically Signed   By: Marcello Moores  Register   On: 03/04/2017 11:32    Assessment/Plan:   Scrotal infection. At  this point well-covered w/ IV abx. I see no evidence of Fourniere's gangrene or process that needs to be drained at this point. I'll continue close f/u. I think dialysis/filtration will help w/ some of the scrotal/penile edema.   LOS: 1 day   Jorja Loa 03/05/2017, 12:44 PM

## 2017-03-05 NOTE — Progress Notes (Signed)
PROGRESS NOTE    [name]  VZC:[id number] DOB: Mar 21, 1956 DOA: 03/04/2017 PCP: Gara Kroner, MD   Brief Narrative:  [name] is a 61 y.o. male with medical history of ESRD on HD, MGUS, Gout, Hep C (recently treated), morbid obesity, sleep apnea who presents from dialysis for sudden shortness of breath. The patient keeps falling asleep and so the wife helps with history. His BP apparently kept dropping while in dialysis. He completed dialysis. 1 pound of fluid was removed to bring him back to his baseline weight. Due to severe dyspnea he was sent to the hospital. No cough, congestion, runny nose or sore thoat. No abdominal pain. No fever or chills.  When I am about to leave the room his wife tells me that she has been trying to get him to go to the doctor for 3 days now mainly for vomiting and diarrhea. They ate out at a restaurant on the day of the [place] (Sunday) and he has since had diarrhea and vomiting. Yesterday he felt exhausted and could not go to work. He vomited again last night.   He also states that his scrotum has been swollen for a few days now. It did not hurt until it was palpated in the ER.     Assessment & Plan:   Principal Problem:   Acute respiratory failure with hypoxia (HCC) Active Problems:   Hypertension   Hyperlipidemia   Gout   Obesity   Diabetes mellitus (HCC)   Monoclonal gammopathies   End stage renal disease (HCC)   Scrotal swelling  #1 acute respiratory failure with hypoxia Questionable etiology. Patient is a morbidly obese gentleman who had presented with hypotension, elevated lactic acid level shortness of breath testicular pain and swelling. Concern for sepsis. Blood cultures 1 of 2 positive for Proteus species. Also concerning for epididymitis. CT angiogram chest negative for PE. Patient currently on empiric IV vancomycin and IV Rocephin. Patient has been seen in consultation by pulmonary. Follow.  #2 probable Proteus  bacteremia/Sepsis Blood cultures 1/2 positive for Proteus. Patient noted to have fevers and hypotension on admission. Patient currently on IV vancomycin and IV Zosyn. Will discontinue IV Zosyn and place on IV Rocephin. Follow.  #3 hypertension Hold antihypertensive medications.  #4 scrotal swelling/scrotal infection Patient has been seen in association by urology. Continue empiric IV antibiotics. Urology feels dialysis will help with some of the scrotal edema. Elevate scrotum.   DVT prophylaxis: Heparin Code Status: Full Family Communication: Updated patient and family at bedside. Disposition Plan: Home once shortness of breath has improved, clinical improvement.   Consultants:   Urology: Dr. Diona Fanti 03/04/2017  Pulmonary: Dr. Melvyn Novas 03/05/2017  Nephrology: Dr.Schertz 03/04/2017  Procedures:   CT angiogram chest 03/04/2017  Chest x-ray 03/04/2017  Ultrasound of the scrotum 03/04/2017  Antimicrobials:   IV vancomycin 03/04/2017  IV Zosyn 03/04/2017>>>> 03/05/2017  IV Rocephin 03/05/2017   Subjective: Patient still with complaints of shortness of breath on ambulation. Patient states feels a little bit better than on admission. Patient still with complaints of scrotal swelling. Patient denies any chest pain. Patient denies any cough.  Objective: Vitals:   03/04/17 2051 03/04/17 2214 03/05/17 0657 03/05/17 1000  BP: 121/69  (!) 94/49 (!) 102/50  Pulse: 99  72 90  Resp: (!) 22  (!) 21 20  Temp: (!) 103.1 F (39.5 C)  (!) 102.9 F (39.4 C) 99.8 F (37.7 C)  TempSrc: Oral   Oral  SpO2: 100%  93% 96%  Weight:  Marland Kitchen)  155.3 kg (342 lb 6.4 oz)    Height:        Intake/Output Summary (Last 24 hours) at 03/05/17 1425 Last data filed at 03/05/17 1334  Gross per 24 hour  Intake              830 ml  Output                1 ml  Net              829 ml   Filed Weights   03/04/17 0904 03/04/17 2214  Weight: (!) 156 kg (343 lb 14.7 oz) (!) 155.3 kg (342 lb 6.4 oz)     Examination:  General exam: Appears calm and comfortable  Respiratory system: Clear to auscultation. Decreased breath sounds in the bases. Respiratory effort normal. Cardiovascular system: S1 & S2 heard, RRR. No JVD, murmurs, rubs, gallops or clicks. No pedal edema. Gastrointestinal system: Abdomen is nondistended, soft and nontender. No organomegaly or masses felt. Normal bowel sounds heard. Central nervous system: Alert and oriented. No focal neurological deficits. Extremities: Symmetric 5 x 5 power. Genitourinary: Edematous scrotum and penis. Skin: No rashes, lesions or ulcers Psychiatry: Judgement and insight appear normal. Mood & affect appropriate.     Data Reviewed: I have personally reviewed following labs and imaging studies  CBC:  Recent Labs Lab 03/04/17 0930 03/04/17 1457  WBC 23.2* 19.1*  NEUTROABS 18.7*  --   HGB 11.2* 10.7*  HCT 34.8* 33.8*  MCV 78.2 77.3*  PLT 140* 379*   Basic Metabolic Panel:  Recent Labs Lab 03/04/17 0930 03/04/17 1457 03/05/17 0900  NA 132*  --  130*  K 4.1  --  5.0  CL 89*  --  87*  CO2 26  --  22  GLUCOSE 322*  --  337*  BUN 44*  --  56*  CREATININE 11.68* 11.96* 14.60*  CALCIUM 8.5*  --  9.3  PHOS  --   --  7.9*   GFR: Estimated Creatinine Clearance: 8.4 mL/min (A) (by C-G formula based on SCr of 14.6 mg/dL (H)). Liver Function Tests:  Recent Labs Lab 03/04/17 0930 03/05/17 0900  AST 49*  --   ALT 24  --   ALKPHOS 125  --   BILITOT 0.6  --   PROT 8.2*  --   ALBUMIN 2.9* 2.4*   No results for input(s): LIPASE, AMYLASE in the last 168 hours. No results for input(s): AMMONIA in the last 168 hours. Coagulation Profile:  Recent Labs Lab 03/04/17 0930  INR 1.21   Cardiac Enzymes: No results for input(s): CKTOTAL, CKMB, CKMBINDEX, TROPONINI in the last 168 hours. BNP (last 3 results) No results for input(s): PROBNP in the last 8760 hours. HbA1C: No results for input(s): HGBA1C in the last 72  hours. CBG:  Recent Labs Lab 03/04/17 1515 03/04/17 1837 03/04/17 2135 03/05/17 0745 03/05/17 1139  GLUCAP 295* 249* 329* 359* 298*   Lipid Profile: No results for input(s): CHOL, HDL, LDLCALC, TRIG, CHOLHDL, LDLDIRECT in the last 72 hours. Thyroid Function Tests: No results for input(s): TSH, T4TOTAL, FREET4, T3FREE, THYROIDAB in the last 72 hours. Anemia Panel: No results for input(s): VITAMINB12, FOLATE, FERRITIN, TIBC, IRON, RETICCTPCT in the last 72 hours. Sepsis Labs:  Recent Labs Lab 03/04/17 0240 03/04/17 1317 03/04/17 1700  LATICACIDVEN 3.30* 1.94* 1.5    Recent Results (from the past 240 hour(s))  Culture, blood (routine x 2)     Status: None (Preliminary result)  Collection Time: 03/04/17  9:27 AM  Result Value Ref Range Status   Specimen Description BLOOD LEFT ANTECUBITAL  Final   Special Requests   Final    BOTTLES DRAWN AEROBIC AND ANAEROBIC Blood Culture adequate volume   Culture   Final    NO GROWTH < 24 HOURS Performed at [facility], [address], [place], [place] [address]    Report Status PENDING  Incomplete  Culture, blood (routine x 2)     Status: None (Preliminary result)   Collection Time: 03/04/17 10:31 AM  Result Value Ref Range Status   Specimen Description BLOOD LEFT HAND  Final   Special Requests   Final    IN PEDIATRIC BOTTLE Blood Culture results may not be optimal due to an excessive volume of blood received in culture bottles   Culture  Setup Time   Final    GRAM NEGATIVE RODS IN PEDIATRIC BOTTLE Organism ID to follow CRITICAL RESULT CALLED TO, READ BACK BY AND VERIFIED WITH: R RUMBARGER 03/05/17 @ 0914 M VESTAL Performed at [facility], [place] [address] [place], [place] [address]    Culture GRAM NEGATIVE RODS  Final   Report Status PENDING  Incomplete  Blood Culture ID Panel (Reflexed)     Status: Abnormal   Collection Time: 03/04/17 10:31 AM  Result Value Ref Range Status   Enterococcus species NOT DETECTED  NOT DETECTED Final   Listeria monocytogenes NOT DETECTED NOT DETECTED Final   Staphylococcus species NOT DETECTED NOT DETECTED Final   Staphylococcus aureus NOT DETECTED NOT DETECTED Final   Streptococcus species NOT DETECTED NOT DETECTED Final   Streptococcus agalactiae NOT DETECTED NOT DETECTED Final   Streptococcus pneumoniae NOT DETECTED NOT DETECTED Final   Streptococcus pyogenes NOT DETECTED NOT DETECTED Final   Acinetobacter baumannii NOT DETECTED NOT DETECTED Final   Enterobacteriaceae species DETECTED (A) NOT DETECTED Final    Comment: Enterobacteriaceae represent a large family of gram-negative bacteria, not a single organism. CRITICAL RESULT CALLED TO, READ BACK BY AND VERIFIED WITH: R RUMBARGER,PHARMD AT 0914 03/05/17 BY L BENFIELD    Enterobacter cloacae complex NOT DETECTED NOT DETECTED Final   Escherichia coli NOT DETECTED NOT DETECTED Final   Klebsiella oxytoca NOT DETECTED NOT DETECTED Final   Klebsiella pneumoniae NOT DETECTED NOT DETECTED Final   Proteus species DETECTED (A) NOT DETECTED Final    Comment: CRITICAL RESULT CALLED TO, READ BACK BY AND VERIFIED WITH: R RUMBARGER,PHARMD AT 0914 03/05/17 BY M VESTAL    Serratia marcescens NOT DETECTED NOT DETECTED Final   Carbapenem resistance NOT DETECTED NOT DETECTED Final   Haemophilus influenzae NOT DETECTED NOT DETECTED Final   Neisseria meningitidis NOT DETECTED NOT DETECTED Final   Pseudomonas aeruginosa NOT DETECTED NOT DETECTED Final   Candida albicans NOT DETECTED NOT DETECTED Final   Candida glabrata NOT DETECTED NOT DETECTED Final   Candida krusei NOT DETECTED NOT DETECTED Final   Candida parapsilosis NOT DETECTED NOT DETECTED Final   Candida tropicalis NOT DETECTED NOT DETECTED Final    Comment: Performed at [facility], [place] [address], [place],  [address]  MRSA PCR Screening     Status: None   Collection Time: 03/04/17  9:50 PM  Result Value Ref Range Status   MRSA by PCR NEGATIVE NEGATIVE  Final    Comment:        The GeneXpert MRSA Assay (FDA approved for NASAL specimens only), is one component of a comprehensive MRSA colonization surveillance  program. It is not intended to diagnose MRSA infection nor to guide or monitor treatment for MRSA infections.          Radiology Studies: Dg Chest 2 View  Result Date: 03/04/2017 CLINICAL DATA:  Increased shortness of breath and decreased O2 saturations. EXAM: CHEST  2 VIEW COMPARISON:  01/07/2014 FINDINGS: Azygos shadow and central vascular structures are mildly prominent. Findings could be related to vascular congestion. There is no frank pulmonary edema. Heart size is within limits. No pleural effusions. No acute bone abnormality. Negative for a pneumothorax. IMPRESSION: Probable vascular congestion without overt pulmonary edema. Electronically Signed   By: Markus Daft M.D.   On: 03/04/2017 09:25   Ct Angio Chest Pe W/cm &/or Wo Cm  Result Date: 03/04/2017 CLINICAL DATA:  61 year old male with increased shortness of breath and hypoxia following dialysis earlier today. EXAM: CT ANGIOGRAPHY CHEST WITH CONTRAST TECHNIQUE: Multidetector CT imaging of the chest was performed using the standard protocol during bolus administration of intravenous contrast. Multiplanar CT image reconstructions and MIPs were obtained to evaluate the vascular anatomy. CONTRAST:  100 mL Isovue 370 COMPARISON:  Chest x-ray 03/04/2017; prior CT scan of the chest 07/09/2014 FINDINGS: Cardiovascular: Limited opacification of the pulmonary arteries secondary to transient physiologic interruption of contrast. No central or proximal or lobar PE. No evidence of aortic dissection or aneurysm. Aberrant right subclavian artery noticed incidentally. The heart is enlarged. No pericardial effusion. Mediastinum/Nodes: Unremarkable CT appearance of the thyroid gland. No suspicious mediastinal or hilar adenopathy. No soft tissue mediastinal mass. The thoracic esophagus is  unremarkable. Lungs/Pleura: Respiratory motion artifact limits evaluation for small pulmonary nodules. No focal airspace consolidation, pleural effusion, pneumothorax or pulmonary edema. Mild dependent atelectasis present in both lower lobes. Upper Abdomen: Although the liver is incompletely evaluated, the left hepatic lobe and caudate lobe appear relatively hypertrophied which may suggest early cirrhotic change. No acute abnormalities within the upper abdomen. Musculoskeletal: No acute fracture or aggressive appearing lytic or blastic osseous lesion. Review of the MIP images confirms the above findings. IMPRESSION: 1. No evidence of acute pulmonary embolus to the level of the proximal lobar arteries. Evaluation of the more distal pulmonary arterial tree is nondiagnostic secondary to a combination of relatively poor vessel opacification and respiratory motion artifact. 2. No evidence of pneumonia or other acute cardiopulmonary process. 3. Bibasilar subsegmental atelectasis. 4. Aberrant right subclavian artery. 5. Although incompletely imaged, the morphology of the liver suggests the possibility of cirrhotic change. Does the patient have clinical signs/symptoms of cirrhosis? Electronically Signed   By: Jacqulynn Cadet M.D.   On: 03/04/2017 12:31   US Scrotum  Result Date: 03/04/2017 CLINICAL DATA:  Scrotal pain and swelling. EXAM: SCROTAL ULTRASOUND DOPPLER ULTRASOUND OF THE TESTICLES TECHNIQUE: Complete ultrasound examination of the testicles, epididymis, and other scrotal structures was performed. Color and spectral Doppler ultrasound were also utilized to evaluate blood flow to the testicles. COMPARISON:  No recent prior. FINDINGS: Right testicle Measurements: 3.4 x 2.0 x 2.5 cm. No mass or microlithiasis visualized. Left testicle Measurements: 3.7 x 2.4 x 2.8 cm. 0.6 x 0.3 x 0.4 small area of increased echogenicity noted in the left testicle. Although this may represent a focal area of fat or a at left  testicular tumor cannot be completely excluded. No microlithiasis visualized . Right epididymis:  Tail of the epididymis is prominent. Left epididymis: Prominent epididymis with heterogeneous echotexture. 7 mm epididymal cyst. Hydrocele:  None visualized. Varicocele: Could not perform Valsalva maneuver due to patient's clinical condition.  Bilateral scrotal skin thickening. Pulsed Doppler interrogation of both testes demonstrates normal low resistance arterial and venous waveforms bilaterally. IMPRESSION: 1. Cannot exclude bilateral epididymitis. 2. Small 0.6 cm area of increased echogenicity noted in the left testicle. Although this may represent small area of fat a small left testicular tumor cannot be excluded no evidence of torsion. Electronically Signed   By: Marcello Moores  Register   On: 03/04/2017 11:32   [place] Art/ven Flow Abd Pelv Doppler  Result Date: 03/04/2017 CLINICAL DATA:  Scrotal pain and swelling. EXAM: SCROTAL ULTRASOUND DOPPLER ULTRASOUND OF THE TESTICLES TECHNIQUE: Complete ultrasound examination of the testicles, epididymis, and other scrotal structures was performed. Color and spectral Doppler ultrasound were also utilized to evaluate blood flow to the testicles. COMPARISON:  No recent prior. FINDINGS: Right testicle Measurements: 3.4 x 2.0 x 2.5 cm. No mass or microlithiasis visualized. Left testicle Measurements: 3.7 x 2.4 x 2.8 cm. 0.6 x 0.3 x 0.4 small area of increased echogenicity noted in the left testicle. Although this may represent a focal area of fat or a at left testicular tumor cannot be completely excluded. No microlithiasis visualized . Right epididymis:  Tail of the epididymis is prominent. Left epididymis: Prominent epididymis with heterogeneous echotexture. 7 mm epididymal cyst. Hydrocele:  None visualized. Varicocele: Could not perform Valsalva maneuver due to patient's clinical condition. Bilateral scrotal skin thickening. Pulsed Doppler interrogation of both testes demonstrates  normal low resistance arterial and venous waveforms bilaterally. IMPRESSION: 1. Cannot exclude bilateral epididymitis. 2. Small 0.6 cm area of increased echogenicity noted in the left testicle. Although this may represent small area of fat a small left testicular tumor cannot be excluded no evidence of torsion. Electronically Signed   By: [place]   On: 03/04/2017 11:32        Scheduled Meds: . allopurinol  100 mg Oral BID  . aspirin EC  81 mg Oral Daily  . carvedilol  6.25 mg Oral BID WC  . cinacalcet  60 mg Oral Q breakfast  . ferric citrate  210 mg Oral TID WC  . gabapentin  200 mg Oral BID  . heparin  5,000 Units Subcutaneous Q8H  . insulin aspart  10 Units Subcutaneous TID WC  . insulin detemir  20 Units Subcutaneous QHS  . pravastatin  40 mg Oral QHS  . white petrolatum       Continuous Infusions: . piperacillin-tazobactam (ZOSYN)  IV 3.375 g (03/05/17 1025)  . vancomycin       LOS: 1 day    Time spent: 22 minutes    THOMPSON,DANIEL, MD [facility] Pager [phone]  If 7PM-7AM, please contact night-coverage [email or web address] Password [email or web address] 03/05/2017, 2:25 PM

## 2017-03-05 NOTE — Progress Notes (Signed)
Patient arrived to unit per bed.  Reviewed treatment plan and this RN agrees.  Report received from bedside RN, Gwenlyn Perking.  Consent obtained.  Patient A & O  X 4. Lung sounds diminished to ausculation in all fields. Generalized edema. Cardiac: regular rate and rhythm.  Prepped RLAVF with alcohol and cannulated with two 15 gauge needles.  Pulsation of blood noted.  Flushed access well with saline per protocol.  Connected and secured lines and initiated tx at 1517.  UF goal of 500 mL and net fluid removal of 0 mL.  Will continue to monitor.

## 2017-03-05 NOTE — Progress Notes (Signed)
RT NOTE:   Pt refuses CPAP tonight. He wants to wear [place] instead. Pt understands to call RT if he changes his mind.

## 2017-03-05 NOTE — Progress Notes (Signed)
PHARMACY - PHYSICIAN COMMUNICATION CRITICAL VALUE ALERT - BLOOD CULTURE IDENTIFICATION (BCID)  Results for orders placed or performed during the hospital encounter of 03/04/17  Blood Culture ID Panel (Reflexed) (Collected: 03/04/2017 10:31 AM)  Result Value Ref Range   Enterococcus species NOT DETECTED NOT DETECTED   Listeria monocytogenes NOT DETECTED NOT DETECTED   Staphylococcus species NOT DETECTED NOT DETECTED   Staphylococcus aureus NOT DETECTED NOT DETECTED   Streptococcus species NOT DETECTED NOT DETECTED   Streptococcus agalactiae NOT DETECTED NOT DETECTED   Streptococcus pneumoniae NOT DETECTED NOT DETECTED   Streptococcus pyogenes NOT DETECTED NOT DETECTED   Acinetobacter baumannii NOT DETECTED NOT DETECTED   Enterobacteriaceae species DETECTED (A) NOT DETECTED   Enterobacter cloacae complex NOT DETECTED NOT DETECTED   Escherichia coli NOT DETECTED NOT DETECTED   Klebsiella oxytoca NOT DETECTED NOT DETECTED   Klebsiella pneumoniae NOT DETECTED NOT DETECTED   Proteus species DETECTED (A) NOT DETECTED   Serratia marcescens NOT DETECTED NOT DETECTED   Carbapenem resistance NOT DETECTED NOT DETECTED   Haemophilus influenzae NOT DETECTED NOT DETECTED   Neisseria meningitidis NOT DETECTED NOT DETECTED   Pseudomonas aeruginosa NOT DETECTED NOT DETECTED   Candida albicans NOT DETECTED NOT DETECTED   Candida glabrata NOT DETECTED NOT DETECTED   Candida krusei NOT DETECTED NOT DETECTED   Candida parapsilosis NOT DETECTED NOT DETECTED   Candida tropicalis NOT DETECTED NOT DETECTED    Name of physician (or Provider) Contacted: [place]  Changes to prescribed antibiotics required: Consider narrowing antibiotics to ceftriaxone 2gm IV Q24H. MD to evaluate.  Avie Checo, Rande Lawman 03/05/2017  10:38 AM

## 2017-03-05 NOTE — Progress Notes (Signed)
Dialysis treatment completed.  0 mL ultrafiltrated and net fluid removal -500 mL.    Patient status unchanged. Lung sounds diminished to ausculation in all fields. Generalized non pitting edema. Cardiac: Regular R&R.  Disconnected lines and removed needles.  Pressure held for 10 minutes and band aid/gauze dressing applied.  Report given to bedside RN, Gwenlyn Perking.

## 2017-03-05 NOTE — Progress Notes (Signed)
[facility] Progress Note   Subjective: pain in scrotum worse  Vitals:   03/04/17 2051 03/04/17 2214 03/05/17 0657 03/05/17 1000  BP: 121/69  (!) 94/49 (!) 102/50  Pulse: 99  72 90  Resp: (!) 22  (!) 21 20  Temp: (!) 103.1 F (39.5 C)  (!) 102.9 F (39.4 C) 99.8 F (37.7 C)  TempSrc: Oral   Oral  SpO2: 100%  93% 96%  Weight:  (!) 155.3 kg (342 lb 6.4 oz)    Height:        Inpatient medications: . allopurinol  100 mg Oral BID  . aspirin EC  81 mg Oral Daily  . carvedilol  6.25 mg Oral BID WC  . cinacalcet  60 mg Oral Q breakfast  . ferric citrate  210 mg Oral TID WC  . gabapentin  200 mg Oral BID  . heparin  5,000 Units Subcutaneous Q8H  . insulin aspart  10 Units Subcutaneous TID WC  . insulin detemir  20 Units Subcutaneous QHS  . pravastatin  40 mg Oral QHS  . white petrolatum       . piperacillin-tazobactam (ZOSYN)  IV 3.375 g (03/05/17 1025)   acetaminophen **OR** acetaminophen, calcium carbonate (dosed in mg elemental calcium), camphor-menthol **AND** hydrOXYzine, docusate sodium, feeding supplement (NEPRO CARB STEADY), ketorolac, ondansetron **OR** ondansetron (ZOFRAN) IV, sorbitol, zolpidem  Exam: Gen [name], alert Standing wt was 157.6kg No jvd or bruits Chest clear bilat to bases RRR no MRG Abd soft ntnd no mass or ascites +bs obese GU penile/ scrotal edema 2-3+ , very tender, no fluctuance or erythema MS no joint effusions or deformity Ext no LE edema.   RUA AVF +eschar over distal aspect of AVF where it is healing, no signs of infection Neuro is alert, Ox 3 , nf    Dialysis: MWF HD 96.0 kg edw   Assessment: 1. Hypotension/ fevers - w/ scrotal/ penile pain and swelling.  Per urology/ primary, on IV abx.   2. ESRD on HD MWF - plan short HD today, 2 hrs 3. Volume - CXR clear, up 1-2kg, no vol ^ on exam 4. HTN - bp's soft, BP meds on hold 5. DM2 - on insulin, per primary 6. Anemia of CKD - get records 7. MBD of CKD - get  records  Plan - short HD today, min UF   Kelly Splinter MD [place] pager [phone]   03/05/2017, 12:30 PM    Recent Labs Lab 03/04/17 0930 03/04/17 1457 03/05/17 0900  NA 132*  --  130*  K 4.1  --  5.0  CL 89*  --  87*  CO2 26  --  22  GLUCOSE 322*  --  337*  BUN 44*  --  56*  CREATININE 11.68* 11.96* 14.60*  CALCIUM 8.5*  --  9.3  PHOS  --   --  7.9*    Recent Labs Lab 03/04/17 0930 03/05/17 0900  AST 49*  --   ALT 24  --   ALKPHOS 125  --   BILITOT 0.6  --   PROT 8.2*  --   ALBUMIN 2.9* 2.4*    Recent Labs Lab 03/04/17 0930 03/04/17 1457  WBC 23.2* 19.1*  NEUTROABS 18.7*  --   HGB 11.2* 10.7*  HCT 34.8* 33.8*  MCV 78.2 77.3*  PLT 140* 146*   Iron/TIBC/Ferritin/ %Sat    Component Value Date/Time   IRON 30 (L) 01/05/2014 1225   TIBC 240 01/05/2014 1225  FERRITIN 98 01/05/2014 1225   IRONPCTSAT 13 (L) 01/05/2014 1225

## 2017-03-05 NOTE — Consult Note (Signed)
Name: [name] MRN: [id number] DOB: [birth year]    ADMISSION DATE:  03/04/2017 CONSULTATION DATE:  03/04/17  REFERRING MD :  Dr. Johnney Killian / EDP   CHIEF COMPLAINT:  SOB   HISTORY OF PRESENT ILLNESS:  61 y/o M who presented to [facility] on 4/20 with complaints of shortness of breath, chills, and low blood pressure.   The patient has a history of ESRD on HD (M,W,F).  He went to dialysis as regularly planned and was concerned as he thought he might be over his dry weight.  The patient had noticed increased scrotal swelling over the past week with chaffing.  He denies sharp testicular pain or pain radiating into his groin.  He states he has tried ice to the scrotum and placing an onion at the site with some improvement.  He also has noted a couple episodes of diarrhea and vomiting (after eating a rice crispy cookie) over the past two days.  He reports the diarrhea was small volume and no blood.  His wife reports concern over his R forearm AV fistula > he recently had it manipulated and she has been concerned that it is not healing well.  She reports they have gone back to [facility] 3 times out of concerns.  At the dialysis center on 4/20, he was under his dry weight.  He was unable to tolerate HD due to low blood pressure and he was sent to the ER for evaluation.  He was also short of breath.    Initial ER evaluation found him to be short of breath and he was placed on BiPAP.  ABG evaluation showed 7.425 / 40 / 68 / 25.9.  He was hypotensive with BP in the 70's.  The patient was given 2L NS bolus with improvement of BP to 129/74.  BiPAP was removed and patient was placed on 3L [place].  CTA was assessed for PE and was negative.  CT was also negative for PNA or acute cardio-pulmoanry process.  It did show subsegmental atelectasis. Scrotal US was obtained and was unable to exclude bilateral epididymitis, small 0.6 cm area of increased echogenicity in left testicle (fat vs tumor), no evidence of torsion.    Labs - Na 132, K 4.1, Cl 89, glucose 322, BUN 44 / Cr 11.68, AG 17, troponin 0.03, lactic acid 3.30, WBC 23.2, hgb 11.2 and platelets 140.   He denies chest pain, pain with inspiration, syncope, dizziness, lightheadedness, melena, hematochezia.  PCCM consulted for evaluation of possible sepsis.  .  SUBJECTIVE:  Much better/ comfortable at 30 degrees HOB on RA   VITAL SIGNS: Temp:  [99.8 F (37.7 C)-103.1 F (39.5 C)] 99.8 F (37.7 C) (04/21 1000) Pulse Rate:  [72-103] 90 (04/21 1000) Resp:  [17-39] 20 (04/21 1000) BP: (94-162)/(47-89) 102/50 (04/21 1000) SpO2:  [91 %-100 %] 96 % (04/21 1000) Weight:  [342 lb 6.4 oz (155.3 kg)] 342 lb 6.4 oz (155.3 kg) (04/20 2214)  PHYSICAL EXAMINATION: General:  Obese adult male in NAD HEENT: MM pink/moist, unable to appreciate JVD PSY: calm/appropriate Neuro: AAOx4, speech clear, MAE CV: s1s2 rrr, no m/r/g PULM: even/non-labored, lungs bilaterally clear  JS:HFWY, non-tender, bsx4 active  Extremities: warm/dry, no edema  Skin: no rashes or lesions    Recent Labs Lab 03/04/17 0930 03/04/17 1457 03/05/17 0900  NA 132*  --  130*  K 4.1  --  5.0  CL 89*  --  87*  CO2 26  --  22  BUN  44*  --  56*  CREATININE 11.68* 11.96* 14.60*  GLUCOSE 322*  --  337*     Recent Labs Lab 03/04/17 0930 03/04/17 1457  HGB 11.2* 10.7*  HCT 34.8* 33.8*  WBC 23.2* 19.1*  PLT 140* 146*    Dg Chest 2 View  Result Date: 03/04/2017 CLINICAL DATA:  Increased shortness of breath and decreased O2 saturations. EXAM: CHEST  2 VIEW COMPARISON:  01/07/2014 FINDINGS: Azygos shadow and central vascular structures are mildly prominent. Findings could be related to vascular congestion. There is no frank pulmonary edema. Heart size is within limits. No pleural effusions. No acute bone abnormality. Negative for a pneumothorax. IMPRESSION: Probable vascular congestion without overt pulmonary edema. Electronically Signed   By: Markus Daft M.D.   On: 03/04/2017 09:25     Ct Angio Chest Pe W/cm &/or Wo Cm  Result Date: 03/04/2017 CLINICAL DATA:  61 year old male with increased shortness of breath and hypoxia following dialysis earlier today. EXAM: CT ANGIOGRAPHY CHEST WITH CONTRAST TECHNIQUE: Multidetector CT imaging of the chest was performed using the standard protocol during bolus administration of intravenous contrast. Multiplanar CT image reconstructions and MIPs were obtained to evaluate the vascular anatomy. CONTRAST:  100 mL Isovue 370 COMPARISON:  Chest x-ray 03/04/2017; prior CT scan of the chest 07/09/2014 FINDINGS: Cardiovascular: Limited opacification of the pulmonary arteries secondary to transient physiologic interruption of contrast. No central or proximal or lobar PE. No evidence of aortic dissection or aneurysm. Aberrant right subclavian artery noticed incidentally. The heart is enlarged. No pericardial effusion. Mediastinum/Nodes: Unremarkable CT appearance of the thyroid gland. No suspicious mediastinal or hilar adenopathy. No soft tissue mediastinal mass. The thoracic esophagus is unremarkable. Lungs/Pleura: Respiratory motion artifact limits evaluation for small pulmonary nodules. No focal airspace consolidation, pleural effusion, pneumothorax or pulmonary edema. Mild dependent atelectasis present in both lower lobes. Upper Abdomen: Although the liver is incompletely evaluated, the left hepatic lobe and caudate lobe appear relatively hypertrophied which may suggest early cirrhotic change. No acute abnormalities within the upper abdomen. Musculoskeletal: No acute fracture or aggressive appearing lytic or blastic osseous lesion. Review of the MIP images confirms the above findings. IMPRESSION: 1. No evidence of acute pulmonary embolus to the level of the proximal lobar arteries. Evaluation of the more distal pulmonary arterial tree is nondiagnostic secondary to a combination of relatively poor vessel opacification and respiratory motion artifact. 2. No  evidence of pneumonia or other acute cardiopulmonary process. 3. Bibasilar subsegmental atelectasis. 4. Aberrant right subclavian artery. 5. Although incompletely imaged, the morphology of the liver suggests the possibility of cirrhotic change. Does the patient have clinical signs/symptoms of cirrhosis? Electronically Signed   By: Jacqulynn Cadet M.D.   On: 03/04/2017 12:31   US Scrotum  Result Date: 03/04/2017 CLINICAL DATA:  Scrotal pain and swelling. EXAM: SCROTAL ULTRASOUND DOPPLER ULTRASOUND OF THE TESTICLES TECHNIQUE: Complete ultrasound examination of the testicles, epididymis, and other scrotal structures was performed. Color and spectral Doppler ultrasound were also utilized to evaluate blood flow to the testicles. COMPARISON:  No recent prior. FINDINGS: Right testicle Measurements: 3.4 x 2.0 x 2.5 cm. No mass or microlithiasis visualized. Left testicle Measurements: 3.7 x 2.4 x 2.8 cm. 0.6 x 0.3 x 0.4 small area of increased echogenicity noted in the left testicle. Although this may represent a focal area of fat or a at left testicular tumor cannot be completely excluded. No microlithiasis visualized . Right epididymis:  Tail of the epididymis is prominent. Left epididymis: Prominent epididymis with heterogeneous echotexture.  7 mm epididymal cyst. Hydrocele:  None visualized. Varicocele: Could not perform Valsalva maneuver due to patient's clinical condition. Bilateral scrotal skin thickening. Pulsed Doppler interrogation of both testes demonstrates normal low resistance arterial and venous waveforms bilaterally. IMPRESSION: 1. Cannot exclude bilateral epididymitis. 2. Small 0.6 cm area of increased echogenicity noted in the left testicle. Although this may represent small area of fat a small left testicular tumor cannot be excluded no evidence of torsion. Electronically Signed   By: Marcello Moores  Register   On: 03/04/2017 11:32   [place] Art/ven Flow Abd Pelv Doppler  Result Date: 03/04/2017 CLINICAL DATA:   Scrotal pain and swelling. EXAM: SCROTAL ULTRASOUND DOPPLER ULTRASOUND OF THE TESTICLES TECHNIQUE: Complete ultrasound examination of the testicles, epididymis, and other scrotal structures was performed. Color and spectral Doppler ultrasound were also utilized to evaluate blood flow to the testicles. COMPARISON:  No recent prior. FINDINGS: Right testicle Measurements: 3.4 x 2.0 x 2.5 cm. No mass or microlithiasis visualized. Left testicle Measurements: 3.7 x 2.4 x 2.8 cm. 0.6 x 0.3 x 0.4 small area of increased echogenicity noted in the left testicle. Although this may represent a focal area of fat or a at left testicular tumor cannot be completely excluded. No microlithiasis visualized . Right epididymis:  Tail of the epididymis is prominent. Left epididymis: Prominent epididymis with heterogeneous echotexture. 7 mm epididymal cyst. Hydrocele:  None visualized. Varicocele: Could not perform Valsalva maneuver due to patient's clinical condition. Bilateral scrotal skin thickening. Pulsed Doppler interrogation of both testes demonstrates normal low resistance arterial and venous waveforms bilaterally. IMPRESSION: 1. Cannot exclude bilateral epididymitis. 2. Small 0.6 cm area of increased echogenicity noted in the left testicle. Although this may represent small area of fat a small left testicular tumor cannot be excluded no evidence of torsion. Electronically Signed   By: Marcello Moores  Register   On: 03/04/2017 11:32        STUDIES:  CTA Chest 4/20 >> neg for PE, negative for PNA or acute cardio-pulmoanry process, + subsegmental atelectasis Scrotal US 4/20 >> unable to exclude bilateral epididymitis, small 0.6 cm area of increased echogenicity in left testicle (fat vs tumor), no evidence of torsion.     ASSESSMENT / PLAN:  Discussion:  61 y/o morbidly obese male admitted 4/20 with hypotension, elevated lactic acid (3), SOB and testicular pain/swelling.  Initial work up concerning for possible sepsis.  Patient  has responded well to volume and is alert / oriented.  DDx for infection include possible bacteremia with recent AVF manipulation, epididymitis (see [place] above) and possible component of volume depletion given N/V.  PE ruled out.  Testicular torsion ruled out.     Sepsis - ddx as above, resolved (note low bp while still had hbp meds on board is no the same severity as sepsis with the same parameter s the meds on board Plan: Per [facility]     ESRD on HD  Lactic Acidosis - resolved  Plan: Per Renal/ [facility]    Epididymitis - torsion ruled out via [place] Plan: May need Urology evaluation  ABX per primary        IDDM - poorly controlled  Plan: SSI per primary  Consider input from DM Coordinator      Obstructive Sleep Apnea  Hx RUL Apical Nodule - subsequent resolution on CT Former Smoker Plan: Nocturnal CPAP  Follow up with Pulmonary as outpatient for OSA > due for an appointment with Dr. Lamonte Sakai in May 2018    Christinia Gully, MD Pulmonary and  [place] [phone] After 5:30 PM or weekends, use [facility] [phone]

## 2017-03-05 NOTE — Progress Notes (Signed)
Pharmacy Antibiotic Note  [name] is a 60 y.o. male admitted on 03/04/2017 with sepsis and a possible scrotal infection.  Pharmacy has been consulted for vancomycin and Zosyn dosing. Blood cultures from 4/20 show 1/2 with BCID of proteus.  WBC 19.1 and Tmax in the last 24 hours is 103.1. Patient is ESRD with HD MWF. His session was shortened yesterday so he will go for a short ~2.5 hour make-up session today.   Plan: Vancomycin 750 mg post-HD today Follow-up HD schedule for further Vancomycin dosing. Continue Zosyn 3.375 gr IV q12h over 4 hours ( HD dosing )   Monitor clinical course, renal function, cultures as available Narrow when able   Height: 6' (182.9 cm) Weight: (!) 342 lb 6.4 oz (155.3 kg) IBW/kg (Calculated) : 77.6  Temp (24hrs), Avg:101.9 F (38.8 C), Min:99.8 F (37.7 C), Max:103.1 F (39.5 C)   Recent Labs Lab 03/04/17 0930 03/04/17 0953 03/04/17 1317 03/04/17 1457 03/04/17 1700 03/05/17 0900  WBC 23.2*  --   --  19.1*  --   --   CREATININE 11.68*  --   --  11.96*  --  14.60*  LATICACIDVEN  --  3.30* 1.94*  --  1.5  --     Estimated Creatinine Clearance: 8.4 mL/min (A) (by C-G formula based on SCr of 14.6 mg/dL (H)).    Allergies  Allergen Reactions  . Penicillins Nausea And Vomiting and Other (See Comments)    Has patient had a PCN reaction causing immediate rash, facial/tongue/throat swelling, SOB or lightheadedness with hypotension: No Has patient had a PCN reaction causing severe rash involving mucus membranes or skin necrosis: No Has patient had a PCN reaction that required hospitalization No Has patient had a PCN reaction occurring within the last 10 years: No If all of the above answers are "NO", then may proceed with Cephalosporin use.  . Vioxx [Rofecoxib] Nausea And Vomiting    Antimicrobials this admission:  4/20 vancomycin >>  4/20 Zosyn >>    HD today (2.5 hours planned at BFR 400)   Microbiology results:  4/20 BCx: GNR in 1/2;  BCID proteus species  4/20 MRSA PCR: Negative   Thank you for allowing pharmacy to be a part of this patient's care.  Uvaldo Bristle, PharmD PGY1 Pharmacy Resident Pager [phone] 03/05/2017 1:32 PM

## 2017-03-06 ENCOUNTER — Inpatient Hospital Stay (HOSPITAL_COMMUNITY): Payer: Medicare Other

## 2017-03-06 ENCOUNTER — Encounter (HOSPITAL_COMMUNITY): Payer: Self-pay | Admitting: Radiology

## 2017-03-06 DIAGNOSIS — N5089 Other specified disorders of the male genital organs: Secondary | ICD-10-CM

## 2017-03-06 DIAGNOSIS — A4150 Gram-negative sepsis, unspecified: Secondary | ICD-10-CM

## 2017-03-06 DIAGNOSIS — I10 Essential (primary) hypertension: Secondary | ICD-10-CM

## 2017-03-06 LAB — RENAL FUNCTION PANEL
ALBUMIN: 2.1 g/dL — AB (ref 3.5–5.0)
Anion gap: 17 — ABNORMAL HIGH (ref 5–15)
BUN: 57 mg/dL — AB (ref 6–20)
CALCIUM: 9 mg/dL (ref 8.9–10.3)
CO2: 22 mmol/L (ref 22–32)
Chloride: 89 mmol/L — ABNORMAL LOW (ref 101–111)
Creatinine, Ser: 13.56 mg/dL — ABNORMAL HIGH (ref 0.61–1.24)
GFR calc Af Amer: 4 mL/min — ABNORMAL LOW (ref >60)
GFR, EST NON AFRICAN AMERICAN: 3 mL/min — AB (ref >60)
Glucose, Bld: 275 mg/dL — ABNORMAL HIGH (ref 65–99)
PHOSPHORUS: 7.7 mg/dL — AB (ref 2.5–4.6)
Potassium: 4.2 mmol/L (ref 3.5–5.1)
SODIUM: 128 mmol/L — AB (ref 135–145)

## 2017-03-06 LAB — BLOOD GAS, ARTERIAL
ACID-BASE DEFICIT: 1.7 mmol/L (ref 0.0–2.0)
Bicarbonate: 22.6 mmol/L (ref 20.0–28.0)
FIO2: 28
O2 SAT: 89.9 %
PH ART: 7.386 (ref 7.350–7.450)
Patient temperature: 98.6
pCO2 arterial: 38.5 mmHg (ref 32.0–48.0)
pO2, Arterial: 71.1 mmHg — ABNORMAL LOW (ref 83.0–108.0)

## 2017-03-06 LAB — CBC WITH DIFFERENTIAL/PLATELET
BASOS ABS: 0.1 10*3/uL (ref 0.0–0.1)
BASOS PCT: 0 %
EOS ABS: 0.1 10*3/uL (ref 0.0–0.7)
Eosinophils Relative: 1 %
HCT: 30.4 % — ABNORMAL LOW (ref 39.0–52.0)
HEMOGLOBIN: 9.7 g/dL — AB (ref 13.0–17.0)
Lymphocytes Relative: 10 %
Lymphs Abs: 1.8 10*3/uL (ref 0.7–4.0)
MCH: 24.2 pg — ABNORMAL LOW (ref 26.0–34.0)
MCHC: 31.9 g/dL (ref 30.0–36.0)
MCV: 75.8 fL — ABNORMAL LOW (ref 78.0–100.0)
MONOS PCT: 9 %
Monocytes Absolute: 1.6 10*3/uL — ABNORMAL HIGH (ref 0.1–1.0)
NEUTROS PCT: 81 %
Neutro Abs: 14.6 10*3/uL — ABNORMAL HIGH (ref 1.7–7.7)
Platelets: 126 10*3/uL — ABNORMAL LOW (ref 150–400)
RBC: 4.01 MIL/uL — ABNORMAL LOW (ref 4.22–5.81)
RDW: 16 % — ABNORMAL HIGH (ref 11.5–15.5)
WBC: 18.1 10*3/uL — AB (ref 4.0–10.5)

## 2017-03-06 LAB — GLUCOSE, CAPILLARY
GLUCOSE-CAPILLARY: 293 mg/dL — AB (ref 65–99)
GLUCOSE-CAPILLARY: 350 mg/dL — AB (ref 65–99)
Glucose-Capillary: 273 mg/dL — ABNORMAL HIGH (ref 65–99)
Glucose-Capillary: 392 mg/dL — ABNORMAL HIGH (ref 65–99)

## 2017-03-06 LAB — VANCOMYCIN, RANDOM: VANCOMYCIN RM: 37

## 2017-03-06 MED ORDER — INSULIN DETEMIR 100 UNIT/ML ~~LOC~~ SOLN
25.0000 [IU] | Freq: Every day | SUBCUTANEOUS | Status: DC
  Filled 2017-03-06: qty 0.25

## 2017-03-06 MED ORDER — SODIUM CHLORIDE 0.9 % IV SOLN: 62.5000 mg | INTRAVENOUS | Status: DC

## 2017-03-06 MED ORDER — INSULIN ASPART 100 UNIT/ML ~~LOC~~ SOLN
15.0000 [IU] | Freq: Three times a day (TID) | SUBCUTANEOUS | Status: DC
  Administered 2017-03-06 (×2): 15 [IU] via SUBCUTANEOUS

## 2017-03-06 MED ORDER — INSULIN DETEMIR 100 UNIT/ML ~~LOC~~ SOLN
30.0000 [IU] | Freq: Every day | SUBCUTANEOUS | Status: DC
Start: 2017-03-06 — End: 2017-03-07
  Administered 2017-03-06: 30 [IU] via SUBCUTANEOUS
  Filled 2017-03-06: qty 0.3

## 2017-03-06 MED ORDER — CALCITRIOL 0.5 MCG PO CAPS
3.5000 ug | ORAL_CAPSULE | ORAL | Status: DC
  Administered 2017-03-07 – 2017-03-10 (×2): 3.5 ug via ORAL
  Filled 2017-03-06 (×3): qty 7

## 2017-03-06 MED ORDER — IOPAMIDOL (ISOVUE-300) INJECTION 61%
INTRAVENOUS | Status: AC
  Administered 2017-03-06: 100 mL
  Filled 2017-03-06: qty 100

## 2017-03-06 MED ORDER — DARBEPOETIN ALFA 40 MCG/0.4ML IJ SOSY
40.0000 ug | PREFILLED_SYRINGE | INTRAMUSCULAR | Status: DC
  Administered 2017-03-07 – 2017-03-14 (×2): 40 ug via INTRAVENOUS
  Filled 2017-03-06 (×2): qty 0.4

## 2017-03-06 MED ORDER — FERRIC CITRATE 1 GM 210 MG(FE) PO TABS
630.0000 mg | ORAL_TABLET | Freq: Three times a day (TID) | ORAL | Status: DC
  Administered 2017-03-06 – 2017-03-14 (×19): 630 mg via ORAL
  Filled 2017-03-06 (×27): qty 3

## 2017-03-06 MED ORDER — INSULIN ASPART 100 UNIT/ML ~~LOC~~ SOLN
0.0000 [IU] | Freq: Three times a day (TID) | SUBCUTANEOUS | Status: DC
  Administered 2017-03-06: 5 [IU] via SUBCUTANEOUS
  Administered 2017-03-06: 9 [IU] via SUBCUTANEOUS
  Administered 2017-03-07: 2 [IU] via SUBCUTANEOUS
  Administered 2017-03-07 – 2017-03-10 (×4): 3 [IU] via SUBCUTANEOUS
  Administered 2017-03-11: 2 [IU] via SUBCUTANEOUS
  Administered 2017-03-12: 3 [IU] via SUBCUTANEOUS
  Administered 2017-03-12: 2 [IU] via SUBCUTANEOUS
  Administered 2017-03-13 – 2017-03-14 (×3): 3 [IU] via SUBCUTANEOUS
  Administered 2017-03-14: 2 [IU] via SUBCUTANEOUS

## 2017-03-06 MED ORDER — CALCITRIOL 0.5 MCG PO CAPS: 3.5000 ug | ORAL_CAPSULE | ORAL | Status: DC

## 2017-03-06 NOTE — Progress Notes (Signed)
RT NOTE:  Pt was not ready for CPAP when RT arrived. Pt understands to let RN know when he is ready. RT will return @ that time.

## 2017-03-06 NOTE — Progress Notes (Signed)
Subjective: Patient reports continued scrotal pain--?possibly better.  Objective: Vital signs in last 24 hours: Temp:  [98.2 F (36.8 C)-99.6 F (37.6 C)] 99.4 F (37.4 C) (04/22 0928) Pulse Rate:  [81-89] 81 (04/22 0928) Resp:  [18-22] 18 (04/22 0928) BP: (88-126)/(39-76) 114/53 (04/22 0928) SpO2:  [84 %-98 %] 98 % (04/22 0928) Weight:  [152 kg (335 lb 1.6 oz)-155.8 kg (343 lb 7.6 oz)] 152 kg (335 lb 1.6 oz) (04/21 2244)  Intake/Output from previous day: 04/21 0701 - 04/22 0700 In: 870 [P.O.:820; IV Piggyback:50] Out: -638 [Stool:2] Intake/Output this shift: Total I/O In: 240 [P.O.:240] Out: -   Physical Exam:  Constitutional: Vital signs reviewed. WD WN in NAD   Eyes: PERRL, No scleral icterus.   Pulmonary/Chest: Normal effort Abdominal: Obese. Non-tender, non-distended, bowel sounds are normal, no masses, organomegaly, or guarding present.  Genitourinary: continued significant scrotal/penile edema. Excoriation in posterior midline scrotum. No fluctuance or crepitus noted in scrotum. No purulent drainage,. Extremities: No cyanosis or edema   Lab Results:  Recent Labs  03/04/17 0930 03/04/17 1457 03/06/17 0701  HGB 11.2* 10.7* 9.7*  HCT 34.8* 33.8* 30.4*   BMET  Recent Labs  03/05/17 0900 03/06/17 0701  NA 130* 128*  K 5.0 4.2  CL 87* 89*  CO2 22 22  GLUCOSE 337* 275*  BUN 56* 57*  CREATININE 14.60* 13.56*  CALCIUM 9.3 9.0    Recent Labs  03/04/17 0930  INR 1.21   No results for input(s): LABURIN in the last 72 hours. Results for orders placed or performed during the hospital encounter of 03/04/17  Culture, blood (routine x 2)     Status: None (Preliminary result)   Collection Time: 03/04/17  9:27 AM  Result Value Ref Range Status   Specimen Description BLOOD LEFT ANTECUBITAL  Final   Special Requests   Final    BOTTLES DRAWN AEROBIC AND ANAEROBIC Blood Culture adequate volume   Culture   Final    NO GROWTH < 24 HOURS Performed at [facility], [place] [address] [place], [place] [address]    Report Status PENDING  Incomplete  Culture, blood (routine x 2)     Status: None (Preliminary result)   Collection Time: 03/04/17 10:31 AM  Result Value Ref Range Status   Specimen Description BLOOD LEFT HAND  Final   Special Requests   Final    IN PEDIATRIC BOTTLE Blood Culture results may not be optimal due to an excessive volume of blood received in culture bottles   Culture  Setup Time   Final    GRAM NEGATIVE RODS IN PEDIATRIC BOTTLE CRITICAL RESULT CALLED TO, READ BACK BY AND VERIFIED WITH: R RUMBARGER 03/05/17 @ 0914 M VESTAL Performed at [facility], [place] [address], [place], [place] [address]    Culture GRAM NEGATIVE RODS  Final   Report Status PENDING  Incomplete  Blood Culture ID Panel (Reflexed)     Status: Abnormal   Collection Time: 03/04/17 10:31 AM  Result Value Ref Range Status   Enterococcus species NOT DETECTED NOT DETECTED Final   Listeria monocytogenes NOT DETECTED NOT DETECTED Final   Staphylococcus species NOT DETECTED NOT DETECTED Final   Staphylococcus aureus NOT DETECTED NOT DETECTED Final   Streptococcus species NOT DETECTED NOT DETECTED Final   Streptococcus agalactiae NOT DETECTED NOT DETECTED Final   Streptococcus pneumoniae NOT DETECTED NOT DETECTED Final   Streptococcus pyogenes NOT DETECTED NOT DETECTED Final   Acinetobacter baumannii NOT DETECTED NOT  DETECTED Final   Enterobacteriaceae species DETECTED (A) NOT DETECTED Final    Comment: Enterobacteriaceae represent a large family of gram-negative bacteria, not a single organism. CRITICAL RESULT CALLED TO, READ BACK BY AND VERIFIED WITH: R RUMBARGER,PHARMD AT 0914 03/05/17 BY L BENFIELD    Enterobacter cloacae complex NOT DETECTED NOT DETECTED Final   Escherichia coli NOT DETECTED NOT DETECTED Final   Klebsiella oxytoca NOT DETECTED NOT DETECTED Final   Klebsiella pneumoniae NOT DETECTED NOT DETECTED Final   Proteus species DETECTED  (A) NOT DETECTED Final    Comment: CRITICAL RESULT CALLED TO, READ BACK BY AND VERIFIED WITH: R RUMBARGER,PHARMD AT 0914 03/05/17 BY M VESTAL    Serratia marcescens NOT DETECTED NOT DETECTED Final   Carbapenem resistance NOT DETECTED NOT DETECTED Final   Haemophilus influenzae NOT DETECTED NOT DETECTED Final   Neisseria meningitidis NOT DETECTED NOT DETECTED Final   Pseudomonas aeruginosa NOT DETECTED NOT DETECTED Final   Candida albicans NOT DETECTED NOT DETECTED Final   Candida glabrata NOT DETECTED NOT DETECTED Final   Candida krusei NOT DETECTED NOT DETECTED Final   Candida parapsilosis NOT DETECTED NOT DETECTED Final   Candida tropicalis NOT DETECTED NOT DETECTED Final    Comment: Performed at [facility], [place] [address] [place], [place] [address]  Respiratory Panel by PCR     Status: None   Collection Time: 03/04/17  2:56 PM  Result Value Ref Range Status   Adenovirus NOT DETECTED NOT DETECTED Final   Coronavirus 229E NOT DETECTED NOT DETECTED Final   Coronavirus HKU1 NOT DETECTED NOT DETECTED Final   Coronavirus NL63 NOT DETECTED NOT DETECTED Final   Coronavirus OC43 NOT DETECTED NOT DETECTED Final   Metapneumovirus NOT DETECTED NOT DETECTED Final   Rhinovirus / Enterovirus NOT DETECTED NOT DETECTED Final   Influenza A NOT DETECTED NOT DETECTED Final   Influenza B NOT DETECTED NOT DETECTED Final   Parainfluenza Virus 1 NOT DETECTED NOT DETECTED Final   Parainfluenza Virus 2 NOT DETECTED NOT DETECTED Final   Parainfluenza Virus 3 NOT DETECTED NOT DETECTED Final   Parainfluenza Virus 4 NOT DETECTED NOT DETECTED Final   Respiratory Syncytial Virus NOT DETECTED NOT DETECTED Final   Bordetella pertussis NOT DETECTED NOT DETECTED Final   Chlamydophila pneumoniae NOT DETECTED NOT DETECTED Final   Mycoplasma pneumoniae NOT DETECTED NOT DETECTED Final    Comment: Performed at [facility], [place] [address], [place],  [address]  MRSA PCR Screening     Status: None    Collection Time: 03/04/17  9:50 PM  Result Value Ref Range Status   MRSA by PCR NEGATIVE NEGATIVE Final    Comment:        The GeneXpert MRSA Assay (FDA approved for NASAL specimens only), is one component of a comprehensive MRSA colonization surveillance program. It is not intended to diagnose MRSA infection nor to guide or monitor treatment for MRSA infections.     Studies/Results: Ct Angio Chest Pe W/cm &/or Wo Cm  Result Date: 03/04/2017 CLINICAL DATA:  61 year old male with increased shortness of breath and hypoxia following dialysis earlier today. EXAM: CT ANGIOGRAPHY CHEST WITH CONTRAST TECHNIQUE: Multidetector CT imaging of the chest was performed using the standard protocol during bolus administration of intravenous contrast. Multiplanar CT image reconstructions and MIPs were obtained to evaluate the vascular anatomy. CONTRAST:  100 mL Isovue 370 COMPARISON:  Chest x-ray 03/04/2017; prior CT scan of the chest 07/09/2014 FINDINGS: Cardiovascular: Limited opacification of the pulmonary arteries  secondary to transient physiologic interruption of contrast. No central or proximal or lobar PE. No evidence of aortic dissection or aneurysm. Aberrant right subclavian artery noticed incidentally. The heart is enlarged. No pericardial effusion. Mediastinum/Nodes: Unremarkable CT appearance of the thyroid gland. No suspicious mediastinal or hilar adenopathy. No soft tissue mediastinal mass. The thoracic esophagus is unremarkable. Lungs/Pleura: Respiratory motion artifact limits evaluation for small pulmonary nodules. No focal airspace consolidation, pleural effusion, pneumothorax or pulmonary edema. Mild dependent atelectasis present in both lower lobes. Upper Abdomen: Although the liver is incompletely evaluated, the left hepatic lobe and caudate lobe appear relatively hypertrophied which may suggest early cirrhotic change. No acute abnormalities within the upper abdomen. Musculoskeletal: No  acute fracture or aggressive appearing lytic or blastic osseous lesion. Review of the MIP images confirms the above findings. IMPRESSION: 1. No evidence of acute pulmonary embolus to the level of the proximal lobar arteries. Evaluation of the more distal pulmonary arterial tree is nondiagnostic secondary to a combination of relatively poor vessel opacification and respiratory motion artifact. 2. No evidence of pneumonia or other acute cardiopulmonary process. 3. Bibasilar subsegmental atelectasis. 4. Aberrant right subclavian artery. 5. Although incompletely imaged, the morphology of the liver suggests the possibility of cirrhotic change. Does the patient have clinical signs/symptoms of cirrhosis? Electronically Signed   By: Jacqulynn Cadet M.D.   On: 03/04/2017 12:31    Assessment/Plan:   Scrotal infection with gm neg bacteremia. He is on appropriate abx. I still see no obvious source to drai @ present. I agree with CT A/P w/o contrast. Would like for scan to go to anus/scrotum to r/o perirectal/anal source and look for scrotal abscess. I'll followup on films later .   LOS: 2 days   Franchot Gallo M 03/06/2017, 11:27 AM

## 2017-03-06 NOTE — Progress Notes (Signed)
Subjective:  Tolerated HD yest/ scrotum pain improved some   Objective Vital signs in last 24 hours: Vitals:   03/05/17 1812 03/05/17 2244 03/06/17 0527 03/06/17 0928  BP: 122/76 (!) 103/57 (!) 113/58 (!) 114/53  Pulse: 88 89 81 81  Resp: (!) 22 20 20 18   Temp: 98.8 F (37.1 C) 98.2 F (36.8 C) 99.6 F (37.6 C) 99.4 F (37.4 C)  TempSrc: Oral Oral Oral Oral  SpO2: 98% (!) 84% 97% 98%  Weight:  (!) 152 kg (335 lb 1.6 oz)    Height:       Weight change: -0.7 kg (-1 lb 8.7 oz)  Physical Exam: General-  [name], alert NAD  Chest-CTA   Card- RRR no MRG Abd-  soft  obese,ntnd  GU- penile/ scrotal edema 2-3+ , very tender, no fluctuance or erythema Ext- 2+bpedal  edema.  Hemodialysis Access RUA AVF= + bruit  And  +eschar over distal aspect of AVF where it is healing, no signs of infection      OP Dialysis:MWF [facility]  4hr 71min  EDW 156 kg 200 dialyzer                           2k/ 2ca bath                            Heparin 10,000 bolus , 2000 units intermit.   Calcitrol 3.5 mcg q hd  Venofer 50 mg q weekly hd                      OP labs = HGB 12 (03/02/17 )  Ca 8.3 , phos 8.8  pth 1215 (  On 02/23/17 )   Problem/Plan: 1.  ESRD - HD MWF ahd short run yest  Am k= 4.2 o2 sat 98 % rm air  Next HD tomor Mon on schedule   2. Scrotal infection - Urology /admit RX  On  iv abx  3. Hypotension/ Volume-  CXR  clear on admit / bp soft  on admit /Bp stable this am 113/58/ ,114/53.   on Carvedilol 6.25 mg bid 4. Anemia of CKD- HGB 9.7 /Fe weekly HD , No ESA  As op  Start tomor on hd  aranesp 40 mcg  03/07/17  5. MBD-  On phos binder Auryxia   3 ac as op  And vit D on HD  With PTH ^^ 1215 / sensipar 60 mg qday  6. OSA- reports OP  Fu fro newer CPAP that does not dry mouth , refuses hosp cpap    Ernest Haber, PA-C [place] [phone] 03/06/2017,9:47 AM  LOS: 2 days   Pt seen, examined and agree w A/P as above.  He is defervescing, blood cx + for GNR's  (Proteus). BP's better, septic state resolving with IV abx.  Chest CT done earlier didn't include the kidneys - have d/w urology > will get CT abd/ pelvis today r/o abscess/ Orpah Greek, etc.   Kelly Splinter MD [facility] pager [phone]   03/06/2017, 11:19 AM    Labs: Basic Metabolic Panel:  Recent Labs Lab 03/04/17 0930 03/04/17 1457 03/05/17 0900 03/06/17 0701  NA 132*  --  130* 128*  K 4.1  --  5.0 4.2  CL 89*  --  87* 89*  CO2 26  --  22 22  GLUCOSE 322*  --  337* 275*  BUN 44*  --  56* 57*  CREATININE 11.68* 11.96* 14.60* 13.56*  CALCIUM 8.5*  --  9.3 9.0  PHOS  --   --  7.9* 7.7*   Liver Function Tests:  Recent Labs Lab 03/04/17 0930 03/05/17 0900 03/06/17 0701  AST 49*  --   --   ALT 24  --   --   ALKPHOS 125  --   --   BILITOT 0.6  --   --   PROT 8.2*  --   --   ALBUMIN 2.9* 2.4* 2.1*   No results for input(s): LIPASE, AMYLASE in the last 168 hours. No results for input(s): AMMONIA in the last 168 hours. CBC:  Recent Labs Lab 03/04/17 0930 03/04/17 1457 03/06/17 0701  WBC 23.2* 19.1* 18.1*  NEUTROABS 18.7*  --  14.6*  HGB 11.2* 10.7* 9.7*  HCT 34.8* 33.8* 30.4*  MCV 78.2 77.3* 75.8*  PLT 140* 146* 126*   Cardiac Enzymes: No results for input(s): CKTOTAL, CKMB, CKMBINDEX, TROPONINI in the last 168 hours. CBG:  Recent Labs Lab 03/05/17 0745 03/05/17 1139 03/05/17 1802 03/05/17 2149 03/06/17 0736  GLUCAP 359* 298* 264* 313* 273*    Studies/Results: Ct Angio Chest Pe W/cm &/or Wo Cm  Result Date: 03/04/2017 CLINICAL DATA:  61 year old male with increased shortness of breath and hypoxia following dialysis earlier today. EXAM: CT ANGIOGRAPHY CHEST WITH CONTRAST TECHNIQUE: Multidetector CT imaging of the chest was performed using the standard protocol during bolus administration of intravenous contrast. Multiplanar CT image reconstructions and MIPs were obtained to evaluate the vascular anatomy. CONTRAST:  100 mL Isovue 370  COMPARISON:  Chest x-ray 03/04/2017; prior CT scan of the chest 07/09/2014 FINDINGS: Cardiovascular: Limited opacification of the pulmonary arteries secondary to transient physiologic interruption of contrast. No central or proximal or lobar PE. No evidence of aortic dissection or aneurysm. Aberrant right subclavian artery noticed incidentally. The heart is enlarged. No pericardial effusion. Mediastinum/Nodes: Unremarkable CT appearance of the thyroid gland. No suspicious mediastinal or hilar adenopathy. No soft tissue mediastinal mass. The thoracic esophagus is unremarkable. Lungs/Pleura: Respiratory motion artifact limits evaluation for small pulmonary nodules. No focal airspace consolidation, pleural effusion, pneumothorax or pulmonary edema. Mild dependent atelectasis present in both lower lobes. Upper Abdomen: Although the liver is incompletely evaluated, the left hepatic lobe and caudate lobe appear relatively hypertrophied which may suggest early cirrhotic change. No acute abnormalities within the upper abdomen. Musculoskeletal: No acute fracture or aggressive appearing lytic or blastic osseous lesion. Review of the MIP images confirms the above findings. IMPRESSION: 1. No evidence of acute pulmonary embolus to the level of the proximal lobar arteries. Evaluation of the more distal pulmonary arterial tree is nondiagnostic secondary to a combination of relatively poor vessel opacification and respiratory motion artifact. 2. No evidence of pneumonia or other acute cardiopulmonary process. 3. Bibasilar subsegmental atelectasis. 4. Aberrant right subclavian artery. 5. Although incompletely imaged, the morphology of the liver suggests the possibility of cirrhotic change. Does the patient have clinical signs/symptoms of cirrhosis? Electronically Signed   By: Jacqulynn Cadet M.D.   On: 03/04/2017 12:31   US Scrotum  Result Date: 03/04/2017 CLINICAL DATA:  Scrotal pain and swelling. EXAM: SCROTAL ULTRASOUND  DOPPLER ULTRASOUND OF THE TESTICLES TECHNIQUE: Complete ultrasound examination of the testicles, epididymis, and other scrotal structures was performed. Color and spectral Doppler ultrasound were also utilized to evaluate blood flow to the testicles. COMPARISON:  No recent prior. FINDINGS: Right testicle Measurements: 3.4 x 2.0 x  2.5 cm. No mass or microlithiasis visualized. Left testicle Measurements: 3.7 x 2.4 x 2.8 cm. 0.6 x 0.3 x 0.4 small area of increased echogenicity noted in the left testicle. Although this may represent a focal area of fat or a at left testicular tumor cannot be completely excluded. No microlithiasis visualized . Right epididymis:  Tail of the epididymis is prominent. Left epididymis: Prominent epididymis with heterogeneous echotexture. 7 mm epididymal cyst. Hydrocele:  None visualized. Varicocele: Could not perform Valsalva maneuver due to patient's clinical condition. Bilateral scrotal skin thickening. Pulsed Doppler interrogation of both testes demonstrates normal low resistance arterial and venous waveforms bilaterally. IMPRESSION: 1. Cannot exclude bilateral epididymitis. 2. Small 0.6 cm area of increased echogenicity noted in the left testicle. Although this may represent small area of fat a small left testicular tumor cannot be excluded no evidence of torsion. Electronically Signed   By: Marcello Moores  Register   On: 03/04/2017 11:32   [place] Art/ven Flow Abd Pelv Doppler  Result Date: 03/04/2017 CLINICAL DATA:  Scrotal pain and swelling. EXAM: SCROTAL ULTRASOUND DOPPLER ULTRASOUND OF THE TESTICLES TECHNIQUE: Complete ultrasound examination of the testicles, epididymis, and other scrotal structures was performed. Color and spectral Doppler ultrasound were also utilized to evaluate blood flow to the testicles. COMPARISON:  No recent prior. FINDINGS: Right testicle Measurements: 3.4 x 2.0 x 2.5 cm. No mass or microlithiasis visualized. Left testicle Measurements: 3.7 x 2.4 x 2.8 cm. 0.6 x 0.3  x 0.4 small area of increased echogenicity noted in the left testicle. Although this may represent a focal area of fat or a at left testicular tumor cannot be completely excluded. No microlithiasis visualized . Right epididymis:  Tail of the epididymis is prominent. Left epididymis: Prominent epididymis with heterogeneous echotexture. 7 mm epididymal cyst. Hydrocele:  None visualized. Varicocele: Could not perform Valsalva maneuver due to patient's clinical condition. Bilateral scrotal skin thickening. Pulsed Doppler interrogation of both testes demonstrates normal low resistance arterial and venous waveforms bilaterally. IMPRESSION: 1. Cannot exclude bilateral epididymitis. 2. Small 0.6 cm area of increased echogenicity noted in the left testicle. Although this may represent small area of fat a small left testicular tumor cannot be excluded no evidence of torsion. Electronically Signed   By: Marcello Moores  Register   On: 03/04/2017 11:32   Medications: . cefTRIAXone (ROCEPHIN)  IV Stopped (03/05/17 1849)   . allopurinol  100 mg Oral BID  . aspirin EC  81 mg Oral Daily  . carvedilol  6.25 mg Oral BID WC  . cinacalcet  60 mg Oral Q breakfast  . ferric citrate  210 mg Oral TID WC  . gabapentin  200 mg Oral BID  . heparin  5,000 Units Subcutaneous Q8H  . insulin aspart  0-9 Units Subcutaneous TID WC  . insulin aspart  15 Units Subcutaneous TID WC  . insulin detemir  20 Units Subcutaneous QHS  . pravastatin  40 mg Oral QHS

## 2017-03-06 NOTE — Progress Notes (Signed)
PROGRESS NOTE    [name]  GQQ:[id number] DOB: June 22, 1956 DOA: 03/04/2017 PCP: Gara Kroner, MD   Brief Narrative:  [name] is a 61 y.o. male with medical history of ESRD on HD, MGUS, Gout, Hep C (recently treated), morbid obesity, sleep apnea who presents from dialysis for sudden shortness of breath. The patient keeps falling asleep and so the wife helps with history. His BP apparently kept dropping while in dialysis. He completed dialysis. 1 pound of fluid was removed to bring him back to his baseline weight. Due to severe dyspnea he was sent to the hospital. No cough, congestion, runny nose or sore thoat. No abdominal pain. No fever or chills.  When I am about to leave the room his wife tells me that she has been trying to get him to go to the doctor for 3 days now mainly for vomiting and diarrhea. They ate out at a restaurant on the day of the [place] (Sunday) and he has since had diarrhea and vomiting. Yesterday he felt exhausted and could not go to work. He vomited again last night.   He also states that his scrotum has been swollen for a few days now. It did not hurt until it was palpated in the ER.     Assessment & Plan:   Principal Problem:   Acute respiratory failure with hypoxia (HCC) Active Problems:   Hypertension   Hyperlipidemia   Gout   Obesity   Diabetes mellitus (HCC)   Monoclonal gammopathies   End stage renal disease (HCC)   Scrotal swelling   ESRD (end stage renal disease) on dialysis (HCC)   Scrotal pain   Sepsis ([place])  #1 acute respiratory failure with hypoxia Questionable etiology. Patient is a morbidly obese gentleman who had presented with hypotension, elevated lactic acid level shortness of breath testicular pain and swelling. Concern for sepsis. Patient states some improvement with shortness of breath after hemodialysis. Influenza PCR is negative. Patient refused C Pap overnight. ABG essentially normal. Patient with her hypoxia. Blood  cultures 1 of 2 positive for Proteus species. Also concerning for epididymitis. CT angiogram chest negative for PE. Patient currently on empiric IV vancomycin and IV Rocephin. Patient has been seen in consultation by pulmonary. Will discontinue IV vancomycin. Continue IV Rocephin. Follow.  #2 probable Proteus bacteremia/Sepsis Blood cultures 1/2 positive for Proteus. Likely may have seeded from the urine. Patient noted to have fevers and hypotension on admission. CT abdomen and pelvis has been ordered per nephrology. Patient currently on IV vancomycin and IV Rocephin. MRSA PCR negative. Will discontinue vancomycin.  #3 hypertension Hold antihypertensive medications.  #4 scrotal swelling/scrotal infection Patient has been seen in association by urology. Blood cultures growing Proteus. CT abdomen and pelvis has been ordered per nephrology. Continue empiric IV antibiotics. Urology feels dialysis will help with some of the scrotal edema. Elevate scrotum.  #5 diabetes mellitus Hemoglobin A1c pending. CBGs have ranged from 273-313. Increase meal coverage insulin to 15 units 3 times a day. Place on sliding scale insulin. Increase Levemir to 25 units at bedtime.  #6 hyperlipidemia Continue statin.  #7 gout Continue allopurinol.  #8 morbid obesity  #9 obstructive sleep apnea Patient refusing CPAP QHS. Patient has been advised that it's in his best interest to use cc Pap at bedtime.  #10 hepatitis C Treated.  DVT prophylaxis: Heparin Code Status: Full Family Communication: Updated patient and family at bedside. Disposition Plan: Home once shortness of breath has improved, clinical improvement and per nephrology  and urology..   Consultants:   Urology: Dr. Diona Fanti 03/04/2017  Pulmonary: Dr. Melvyn Novas 03/05/2017  Nephrology: Dr.Schertz 03/04/2017  Procedures:   CT angiogram chest 03/04/2017  Chest x-ray 03/04/2017  Ultrasound of the scrotum 03/04/2017  Antimicrobials:   IV  vancomycin 03/04/2017>>>>>> 03/06/2017  IV Zosyn 03/04/2017>>>> 03/05/2017  IV Rocephin 03/05/2017   Subjective: Patient states improvement with shortness of breath after hemodialysis. Patient feels some improvement with scrotal swelling. No chest pain. No cough.   Objective: Vitals:   03/05/17 1812 03/05/17 2244 03/06/17 0527 03/06/17 0928  BP: 122/76 (!) 103/57 (!) 113/58 (!) 114/53  Pulse: 88 89 81 81  Resp: (!) 22 20 20 18   Temp: 98.8 F (37.1 C) 98.2 F (36.8 C) 99.6 F (37.6 C) 99.4 F (37.4 C)  TempSrc: Oral Oral Oral Oral  SpO2: 98% (!) 84% 97% 98%  Weight:  (!) 152 kg (335 lb 1.6 oz)    Height:        Intake/Output Summary (Last 24 hours) at 03/06/17 1427 Last data filed at 03/06/17 0900  Gross per 24 hour  Intake              630 ml  Output             -498 ml  Net             1128 ml   Filed Weights   03/05/17 1503 03/05/17 1717 03/05/17 2244  Weight: (!) 155.3 kg (342 lb 6 oz) (!) 155.8 kg (343 lb 7.6 oz) (!) 152 kg (335 lb 1.6 oz)    Examination:  General exam: Appears calm and comfortable  Respiratory system: Clear to auscultation. Decreased breath sounds in the bases. Respiratory effort normal. Cardiovascular system: S1 & S2 heard, RRR. No JVD, murmurs, rubs, gallops or clicks. No pedal edema. Gastrointestinal system: Abdomen is nondistended, soft and nontender. No organomegaly or masses felt. Normal bowel sounds heard. Central nervous system: Alert and oriented. No focal neurological deficits. Extremities: Symmetric 5 x 5 power. Genitourinary: Edematous scrotum and penis. Skin: No rashes, lesions or ulcers Psychiatry: Judgement and insight appear normal. Mood & affect appropriate.     Data Reviewed: I have personally reviewed following labs and imaging studies  CBC:  Recent Labs Lab 03/04/17 0930 03/04/17 1457 03/06/17 0701  WBC 23.2* 19.1* 18.1*  NEUTROABS 18.7*  --  14.6*  HGB 11.2* 10.7* 9.7*  HCT 34.8* 33.8* 30.4*  MCV 78.2 77.3*  75.8*  PLT 140* 146* 226*   Basic Metabolic Panel:  Recent Labs Lab 03/04/17 0930 03/04/17 1457 03/05/17 0900 03/06/17 0701  NA 132*  --  130* 128*  K 4.1  --  5.0 4.2  CL 89*  --  87* 89*  CO2 26  --  22 22  GLUCOSE 322*  --  337* 275*  BUN 44*  --  56* 57*  CREATININE 11.68* 11.96* 14.60* 13.56*  CALCIUM 8.5*  --  9.3 9.0  PHOS  --   --  7.9* 7.7*   GFR: Estimated Creatinine Clearance: 8.9 mL/min (A) (by C-G formula based on SCr of 13.56 mg/dL (H)). Liver Function Tests:  Recent Labs Lab 03/04/17 0930 03/05/17 0900 03/06/17 0701  AST 49*  --   --   ALT 24  --   --   ALKPHOS 125  --   --   BILITOT 0.6  --   --   PROT 8.2*  --   --   ALBUMIN 2.9* 2.4* 2.1*  No results for input(s): LIPASE, AMYLASE in the last 168 hours. No results for input(s): AMMONIA in the last 168 hours. Coagulation Profile:  Recent Labs Lab 03/04/17 0930  INR 1.21   Cardiac Enzymes: No results for input(s): CKTOTAL, CKMB, CKMBINDEX, TROPONINI in the last 168 hours. BNP (last 3 results) No results for input(s): PROBNP in the last 8760 hours. HbA1C: No results for input(s): HGBA1C in the last 72 hours. CBG:  Recent Labs Lab 03/05/17 1139 03/05/17 1802 03/05/17 2149 03/06/17 0736 03/06/17 1142  GLUCAP 298* 264* 313* 273* 293*   Lipid Profile: No results for input(s): CHOL, HDL, LDLCALC, TRIG, CHOLHDL, LDLDIRECT in the last 72 hours. Thyroid Function Tests: No results for input(s): TSH, T4TOTAL, FREET4, T3FREE, THYROIDAB in the last 72 hours. Anemia Panel: No results for input(s): VITAMINB12, FOLATE, FERRITIN, TIBC, IRON, RETICCTPCT in the last 72 hours. Sepsis Labs:  Recent Labs Lab 03/04/17 9381 03/04/17 1317 03/04/17 1700  LATICACIDVEN 3.30* 1.94* 1.5    Recent Results (from the past 240 hour(s))  Culture, blood (routine x 2)     Status: None (Preliminary result)   Collection Time: 03/04/17  9:27 AM  Result Value Ref Range Status   Specimen Description BLOOD LEFT  ANTECUBITAL  Final   Special Requests   Final    BOTTLES DRAWN AEROBIC AND ANAEROBIC Blood Culture adequate volume   Culture   Final    NO GROWTH 2 DAYS Performed at [facility], [place] [address] [place], [place] [address]    Report Status PENDING  Incomplete  Culture, blood (routine x 2)     Status: Abnormal (Preliminary result)   Collection Time: 03/04/17 10:31 AM  Result Value Ref Range Status   Specimen Description BLOOD LEFT HAND  Final   Special Requests   Final    IN PEDIATRIC BOTTLE Blood Culture results may not be optimal due to an excessive volume of blood received in culture bottles   Culture  Setup Time   Final    GRAM NEGATIVE RODS IN PEDIATRIC BOTTLE CRITICAL RESULT CALLED TO, READ BACK BY AND VERIFIED WITH: R RUMBARGER 03/05/17 @ 0914 M VESTAL    Culture (A)  Final    PROTEUS MIRABILIS SUSCEPTIBILITIES TO FOLLOW Performed at [facility], [place] [address], [place], [place] [address]    Report Status PENDING  Incomplete  Blood Culture ID Panel (Reflexed)     Status: Abnormal   Collection Time: 03/04/17 10:31 AM  Result Value Ref Range Status   Enterococcus species NOT DETECTED NOT DETECTED Final   Listeria monocytogenes NOT DETECTED NOT DETECTED Final   Staphylococcus species NOT DETECTED NOT DETECTED Final   Staphylococcus aureus NOT DETECTED NOT DETECTED Final   Streptococcus species NOT DETECTED NOT DETECTED Final   Streptococcus agalactiae NOT DETECTED NOT DETECTED Final   Streptococcus pneumoniae NOT DETECTED NOT DETECTED Final   Streptococcus pyogenes NOT DETECTED NOT DETECTED Final   Acinetobacter baumannii NOT DETECTED NOT DETECTED Final   Enterobacteriaceae species DETECTED (A) NOT DETECTED Final    Comment: Enterobacteriaceae represent a large family of gram-negative bacteria, not a single organism. CRITICAL RESULT CALLED TO, READ BACK BY AND VERIFIED WITH: R RUMBARGER,PHARMD AT 2778 03/05/17 BY L BENFIELD    Enterobacter cloacae complex NOT  DETECTED NOT DETECTED Final   Escherichia coli NOT DETECTED NOT DETECTED Final   Klebsiella oxytoca NOT DETECTED NOT DETECTED Final   Klebsiella pneumoniae NOT DETECTED NOT DETECTED Final   Proteus species DETECTED (A) NOT  DETECTED Final    Comment: CRITICAL RESULT CALLED TO, READ BACK BY AND VERIFIED WITH: R RUMBARGER,PHARMD AT 0914 03/05/17 BY M VESTAL    Serratia marcescens NOT DETECTED NOT DETECTED Final   Carbapenem resistance NOT DETECTED NOT DETECTED Final   Haemophilus influenzae NOT DETECTED NOT DETECTED Final   Neisseria meningitidis NOT DETECTED NOT DETECTED Final   Pseudomonas aeruginosa NOT DETECTED NOT DETECTED Final   Candida albicans NOT DETECTED NOT DETECTED Final   Candida glabrata NOT DETECTED NOT DETECTED Final   Candida krusei NOT DETECTED NOT DETECTED Final   Candida parapsilosis NOT DETECTED NOT DETECTED Final   Candida tropicalis NOT DETECTED NOT DETECTED Final    Comment: Performed at [facility], [place] [address], [place], [place] [address]  Respiratory Panel by PCR     Status: None   Collection Time: 03/04/17  2:56 PM  Result Value Ref Range Status   Adenovirus NOT DETECTED NOT DETECTED Final   Coronavirus 229E NOT DETECTED NOT DETECTED Final   Coronavirus HKU1 NOT DETECTED NOT DETECTED Final   Coronavirus NL63 NOT DETECTED NOT DETECTED Final   Coronavirus OC43 NOT DETECTED NOT DETECTED Final   Metapneumovirus NOT DETECTED NOT DETECTED Final   Rhinovirus / Enterovirus NOT DETECTED NOT DETECTED Final   Influenza A NOT DETECTED NOT DETECTED Final   Influenza B NOT DETECTED NOT DETECTED Final   Parainfluenza Virus 1 NOT DETECTED NOT DETECTED Final   Parainfluenza Virus 2 NOT DETECTED NOT DETECTED Final   Parainfluenza Virus 3 NOT DETECTED NOT DETECTED Final   Parainfluenza Virus 4 NOT DETECTED NOT DETECTED Final   Respiratory Syncytial Virus NOT DETECTED NOT DETECTED Final   Bordetella pertussis NOT DETECTED NOT DETECTED Final   Chlamydophila  pneumoniae NOT DETECTED NOT DETECTED Final   Mycoplasma pneumoniae NOT DETECTED NOT DETECTED Final    Comment: Performed at [facility], [place] [address] [place], [place] [address]  MRSA PCR Screening     Status: None   Collection Time: 03/04/17  9:50 PM  Result Value Ref Range Status   MRSA by PCR NEGATIVE NEGATIVE Final    Comment:        The GeneXpert MRSA Assay (FDA approved for NASAL specimens only), is one component of a comprehensive MRSA colonization surveillance program. It is not intended to diagnose MRSA infection nor to guide or monitor treatment for MRSA infections.          Radiology Studies: No results found.      Scheduled Meds: . allopurinol  100 mg Oral BID  . aspirin EC  81 mg Oral Daily  . [START ON 03/07/2017] calcitRIOL  3.5 mcg Oral Q M,W,F-HD  . cinacalcet  60 mg Oral Q breakfast  . [START ON 03/07/2017] darbepoetin (ARANESP) injection - DIALYSIS  40 mcg Intravenous Q Mon-HD  . ferric citrate  630 mg Oral TID WC  . gabapentin  200 mg Oral BID  . insulin aspart  0-9 Units Subcutaneous TID WC  . insulin aspart  15 Units Subcutaneous TID WC  . insulin detemir  20 Units Subcutaneous QHS  . pravastatin  40 mg Oral QHS   Continuous Infusions: . cefTRIAXone (ROCEPHIN)  IV Stopped (03/05/17 1849)  . [START ON 03/09/2017] ferric gluconate (FERRLECIT/NULECIT) IV       LOS: 2 days    Time spent: 35 minutes    Caydance Kuehnle, MD [facility] Pager [phone]  If 7PM-7AM, please contact night-coverage [email or web address] Password [facility] [phone] 03/06/2017, 2:27 PM

## 2017-03-07 ENCOUNTER — Inpatient Hospital Stay (HOSPITAL_COMMUNITY): Payer: Medicare Other

## 2017-03-07 ENCOUNTER — Ambulatory Visit: Payer: Medicare Other

## 2017-03-07 DIAGNOSIS — R7881 Bacteremia: Secondary | ICD-10-CM

## 2017-03-07 DIAGNOSIS — Z880 Allergy status to penicillin: Secondary | ICD-10-CM

## 2017-03-07 DIAGNOSIS — Z886 Allergy status to analgesic agent status: Secondary | ICD-10-CM

## 2017-03-07 DIAGNOSIS — Z801 Family history of malignant neoplasm of trachea, bronchus and lung: Secondary | ICD-10-CM

## 2017-03-07 DIAGNOSIS — F1010 Alcohol abuse, uncomplicated: Secondary | ICD-10-CM

## 2017-03-07 DIAGNOSIS — R0600 Dyspnea, unspecified: Secondary | ICD-10-CM

## 2017-03-07 DIAGNOSIS — K0889 Other specified disorders of teeth and supporting structures: Secondary | ICD-10-CM

## 2017-03-07 DIAGNOSIS — M1A9XX0 Chronic gout, unspecified, without tophus (tophi): Secondary | ICD-10-CM

## 2017-03-07 DIAGNOSIS — Z833 Family history of diabetes mellitus: Secondary | ICD-10-CM

## 2017-03-07 DIAGNOSIS — Z87891 Personal history of nicotine dependence: Secondary | ICD-10-CM

## 2017-03-07 DIAGNOSIS — Z8249 Family history of ischemic heart disease and other diseases of the circulatory system: Secondary | ICD-10-CM

## 2017-03-07 DIAGNOSIS — N492 Inflammatory disorders of scrotum: Secondary | ICD-10-CM

## 2017-03-07 DIAGNOSIS — E1122 Type 2 diabetes mellitus with diabetic chronic kidney disease: Secondary | ICD-10-CM

## 2017-03-07 LAB — RENAL FUNCTION PANEL
ALBUMIN: 2 g/dL — AB (ref 3.5–5.0)
Anion gap: 17 — ABNORMAL HIGH (ref 5–15)
BUN: 76 mg/dL — ABNORMAL HIGH (ref 6–20)
CALCIUM: 8.6 mg/dL — AB (ref 8.9–10.3)
CHLORIDE: 91 mmol/L — AB (ref 101–111)
CO2: 20 mmol/L — ABNORMAL LOW (ref 22–32)
CREATININE: 15.04 mg/dL — AB (ref 0.61–1.24)
GFR calc Af Amer: 4 mL/min — ABNORMAL LOW (ref >60)
GFR, EST NON AFRICAN AMERICAN: 3 mL/min — AB (ref >60)
Glucose, Bld: 336 mg/dL — ABNORMAL HIGH (ref 65–99)
PHOSPHORUS: 8.7 mg/dL — AB (ref 2.5–4.6)
Potassium: 4.8 mmol/L (ref 3.5–5.1)
SODIUM: 128 mmol/L — AB (ref 135–145)

## 2017-03-07 LAB — GLUCOSE, CAPILLARY
Glucose-Capillary: 181 mg/dL — ABNORMAL HIGH (ref 65–99)
Glucose-Capillary: 220 mg/dL — ABNORMAL HIGH (ref 65–99)
Glucose-Capillary: 197 mg/dL — ABNORMAL HIGH (ref 65–99)

## 2017-03-07 LAB — ECHOCARDIOGRAM COMPLETE
Height: 72 in
WEIGHTICAEL: 5569.7 [oz_av]

## 2017-03-07 LAB — CULTURE, BLOOD (ROUTINE X 2)

## 2017-03-07 LAB — CBC
HCT: 31.3 % — ABNORMAL LOW (ref 39.0–52.0)
Hemoglobin: 10.1 g/dL — ABNORMAL LOW (ref 13.0–17.0)
MCH: 24.2 pg — AB (ref 26.0–34.0)
MCHC: 32.3 g/dL (ref 30.0–36.0)
MCV: 75.1 fL — ABNORMAL LOW (ref 78.0–100.0)
PLATELETS: 132 10*3/uL — AB (ref 150–400)
RBC: 4.17 MIL/uL — ABNORMAL LOW (ref 4.22–5.81)
RDW: 16.2 % — ABNORMAL HIGH (ref 11.5–15.5)
WBC: 14.2 10*3/uL — AB (ref 4.0–10.5)

## 2017-03-07 MED ORDER — INSULIN ASPART 100 UNIT/ML ~~LOC~~ SOLN
20.0000 [IU] | Freq: Three times a day (TID) | SUBCUTANEOUS | Status: DC
  Administered 2017-03-07 – 2017-03-09 (×3): 20 [IU] via SUBCUTANEOUS

## 2017-03-07 MED ORDER — CEFAZOLIN SODIUM-DEXTROSE 2-4 GM/100ML-% IV SOLN
2.0000 g | Freq: Once | INTRAVENOUS | Status: AC
  Administered 2017-03-07: 2 g via INTRAVENOUS
  Filled 2017-03-07: qty 100

## 2017-03-07 MED ORDER — CEFAZOLIN SODIUM-DEXTROSE 2-4 GM/100ML-% IV SOLN: 2.0000 g | INTRAVENOUS | Status: DC

## 2017-03-07 MED ORDER — INSULIN DETEMIR 100 UNIT/ML ~~LOC~~ SOLN
30.0000 [IU] | Freq: Two times a day (BID) | SUBCUTANEOUS | Status: DC
  Administered 2017-03-07 – 2017-03-08 (×3): 30 [IU] via SUBCUTANEOUS
  Filled 2017-03-07 (×4): qty 0.3

## 2017-03-07 MED ORDER — DARBEPOETIN ALFA 40 MCG/0.4ML IJ SOSY
PREFILLED_SYRINGE | INTRAMUSCULAR | Status: AC
  Administered 2017-03-07: 40 ug via INTRAVENOUS
  Filled 2017-03-07: qty 0.4

## 2017-03-07 NOTE — Consult Note (Addendum)
[name] for Infectious Disease  Date of Admission:  03/04/2017  Date of Consult:  03/07/2017  Reason for Consult: Scrotal celluliits Referring Physician: Thompson  Impression/Recommendation Scrotal cellulits  No abscess or Fourniere's on CT Proteus bacteremia AVF  Would change anbx to ancef Complete 2 weeks of anbx with HD Keep scrotum elevated.  f/u clinically wil f/u with [facility] for his AVF  Available as needed.   Thank you so much for this interesting consult,   [name] (pager) [phone] [email or web address]  [name] is an 61 y.o. male.  HPI: 61 yo M with ETOH abuse and DM, obesity, ESRD, comes to ED on 4-20 with SOB and n/v/diarrhea. He also complained of scrotal pain for several days. In ED he was hypoxic, tachypneic, hypotensive. His WBC was 23.2. Temp was 103.1.  He and his wife also voiced concerns about his HD fistula- it has been manipulated and he has had f[facility]u at [facility].  He was started on zosyn/vanco --> ceftriaxone.  His BCx were + for Proteus. He was eval by urology who did not feel he had Fourniere's. This was also r/o on CT scan.   Past Medical History:  Diagnosis Date  . Alcohol abuse   . Congestive heart disease ([place]) 12/2011  . Diabetes mellitus   . Gout   . Hepatitis    being treated for Hepatitis C  . Hyperlipidemia   . Hyperlipidemia   . Hypertension   . Monoclonal gammopathies 02/25/2012  . Pedal edema   . Renal insufficiency   . Sleep apnea    uses cpap  . Tobacco abuse     Past Surgical History:  Procedure Laterality Date  . AV FISTULA PLACEMENT Right 01/10/2014   Procedure: ARTERIOVENOUS (AV) FISTULA CREATION- RIGHT RADIOCEPHALIC ;  Surgeon: Angelia Mould, MD;  Location: [facility];  Service: Vascular;  Laterality: Right;  . COLONOSCOPY    . EYE SURGERY     cataract surgery  . Ganglion cyst removal x 2    . INSERTION OF DIALYSIS CATHETER N/A 01/07/2014   Procedure: INSERTION OF DIALYSIS CATHETER;   Surgeon: Conrad Manasquan, MD;  Location: [facility];  Service: Vascular;  Laterality: N/A;  . Left knee arthroscopy with generalized joint synovectomy,  01/2006  . LIGATION OF COMPETING BRANCHES OF ARTERIOVENOUS FISTULA Right 05/28/2014   Procedure: LIGATION OF COMPETING BRANCHES OF ARTERIOVENOUS FISTULA;  Surgeon: Angelia Mould, MD;  Location: [facility];  Service: Vascular;  Laterality: Right;  . REVISON OF ARTERIOVENOUS FISTULA Right 0/01/8881   Procedure: PLICATION REPAIR OF ARTERIOVENOUS FISTULA PSEUDOANEURYSM;  Surgeon: Serafina Mitchell, MD;  Location: [facility];  Service: Vascular;  Laterality: Right;  . SHUNTOGRAM Right 05/14/2014   Procedure: FISTULOGRAM;  Surgeon: Serafina Mitchell, MD;  Location: [facility] CATH LAB;  Service: Cardiovascular;  Laterality: Right;     Allergies  Allergen Reactions  . Penicillins Nausea And Vomiting and Other (See Comments)    Has patient had a PCN reaction causing immediate rash, facial/tongue/throat swelling, SOB or lightheadedness with hypotension: No Has patient had a PCN reaction causing severe rash involving mucus membranes or skin necrosis: No Has patient had a PCN reaction that required hospitalization No Has patient had a PCN reaction occurring within the last 10 years: No If all of the above answers are "NO", then may proceed with Cephalosporin use.  . Vioxx [Rofecoxib] Nausea And Vomiting    Medications:  Scheduled: . allopurinol  100 mg Oral BID  .  aspirin EC  81 mg Oral Daily  . calcitRIOL  3.5 mcg Oral Q M,W,F-HD  . cinacalcet  60 mg Oral Q breakfast  . darbepoetin (ARANESP) injection - DIALYSIS  40 mcg Intravenous Q Mon-HD  . ferric citrate  630 mg Oral TID WC  . gabapentin  200 mg Oral BID  . insulin aspart  0-9 Units Subcutaneous TID WC  . insulin aspart  20 Units Subcutaneous TID WC  . insulin detemir  30 Units Subcutaneous BID  . pravastatin  40 mg Oral QHS    Abtx:  Anti-infectives    Start     Dose/Rate Route Frequency Ordered Stop    03/05/17 1800  vancomycin (VANCOCIN) IVPB 750 mg/150 ml premix     750 mg 150 mL/hr over 60 Minutes Intravenous Every T-Th-Sa (Hemodialysis) 03/05/17 1341 03/05/17 2020   03/05/17 1500  cefTRIAXone (ROCEPHIN) 2 g in dextrose 5 % 50 mL IVPB     2 g 100 mL/hr over 30 Minutes Intravenous Every 24 hours 03/05/17 1427     03/04/17 2200  piperacillin-tazobactam (ZOSYN) IVPB 3.375 g  Status:  Discontinued     3.375 g 12.5 mL/hr over 240 Minutes Intravenous Every 12 hours 03/04/17 1318 03/05/17 1427   03/04/17 1030  vancomycin (VANCOCIN) 2,500 mg in sodium chloride 0.9 % 500 mL IVPB     2,500 mg 250 mL/hr over 120 Minutes Intravenous  Once 03/04/17 1016 03/04/17 1339   03/04/17 1015  piperacillin-tazobactam (ZOSYN) IVPB 3.375 g     3.375 g 100 mL/hr over 30 Minutes Intravenous  Once 03/04/17 1007 03/04/17 1114   03/04/17 1015  vancomycin (VANCOCIN) IVPB 1000 mg/200 mL premix  Status:  Discontinued     1,000 mg 200 mL/hr over 60 Minutes Intravenous  Once 03/04/17 1007 03/04/17 1015      Total days of antibiotics: 3          Social History:  reports that he quit smoking about 6 years ago. His smoking use included Cigarettes. He has a 9.90 pack-year smoking history. He has never used smokeless tobacco. He reports that he does not drink alcohol or use drugs.  Family History  Problem Relation Age of Onset  . Diabetes Mother   . Heart disease Mother   . Lung cancer Father     General ROS: anuric, +BM, neuropathy+ Please see HPI. 12 point ROS o/w (-)  Blood pressure (!) 112/45, pulse 87, temperature 98.5 F (36.9 C), temperature source Oral, resp. rate 20, height 6' (1.829 m), weight (!) 157.9 kg (348 lb 1.7 oz), SpO2 98 %. General appearance: alert, cooperative, no distress and morbidly obese Eyes: negative findings: pupils equal, round, reactive to light and accomodation Throat: normal findings: oropharynx pink & moist without lesions or evidence of thrush and abnormal findings: dentition:  poor Neck: no adenopathy and supple, symmetrical, trachea midline Lungs: diminished breath sounds anterior - bilateral Heart: regular rate and rhythm Abdomen: normal findings: bowel sounds normal and soft, non-tender and abnormal findings:  distended Male genitalia: superficial wounds on penis, scrotum. significant edema.  Extremities: edema non-pitting.  and no diabetic foot lesions.  Some crusting, small open wound with blood at AVF  Results for orders placed or performed during the hospital encounter of 03/04/17 (from the past 48 hour(s))  Glucose, capillary     Status: Abnormal   Collection Time: 03/05/17  6:02 PM  Result Value Ref Range   Glucose-Capillary 264 (H) 65 - 99 mg/dL  Glucose, capillary  Status: Abnormal   Collection Time: 03/05/17  9:49 PM  Result Value Ref Range   Glucose-Capillary 313 (H) 65 - 99 mg/dL  Vancomycin, random     Status: None   Collection Time: 03/06/17  7:01 AM  Result Value Ref Range   Vancomycin Rm 37     Comment:        Random Vancomycin therapeutic range is dependent on dosage and time of specimen collection. A peak range is 20.0-40.0 ug/mL A trough range is 5.0-15.0 ug/mL          CBC with Differential/Platelet     Status: Abnormal   Collection Time: 03/06/17  7:01 AM  Result Value Ref Range   WBC 18.1 (H) 4.0 - 10.5 K/uL   RBC 4.01 (L) 4.22 - 5.81 MIL/uL   Hemoglobin 9.7 (L) 13.0 - 17.0 g/dL   HCT 30.4 (L) 39.0 - 52.0 %   MCV 75.8 (L) 78.0 - 100.0 fL   MCH 24.2 (L) 26.0 - 34.0 pg   MCHC 31.9 30.0 - 36.0 g/dL   RDW 16.0 (H) 11.5 - 15.5 %   Platelets 126 (L) 150 - 400 K/uL   Neutrophils Relative % 81 %   Neutro Abs 14.6 (H) 1.7 - 7.7 K/uL   Lymphocytes Relative 10 %   Lymphs Abs 1.8 0.7 - 4.0 K/uL   Monocytes Relative 9 %   Monocytes Absolute 1.6 (H) 0.1 - 1.0 K/uL   Eosinophils Relative 1 %   Eosinophils Absolute 0.1 0.0 - 0.7 K/uL   Basophils Relative 0 %   Basophils Absolute 0.1 0.0 - 0.1 K/uL  Renal function panel      Status: Abnormal   Collection Time: 03/06/17  7:01 AM  Result Value Ref Range   Sodium 128 (L) 135 - 145 mmol/L   Potassium 4.2 3.5 - 5.1 mmol/L   Chloride 89 (L) 101 - 111 mmol/L   CO2 22 22 - 32 mmol/L   Glucose, Bld 275 (H) 65 - 99 mg/dL   BUN 57 (H) 6 - 20 mg/dL   Creatinine, Ser 13.56 (H) 0.61 - 1.24 mg/dL   Calcium 9.0 8.9 - 10.3 mg/dL   Phosphorus 7.7 (H) 2.5 - 4.6 mg/dL   Albumin 2.1 (L) 3.5 - 5.0 g/dL   GFR calc non Af Amer 3 (L) >60 mL/min   GFR calc Af Amer 4 (L) >60 mL/min    Comment: (NOTE) The eGFR has been calculated using the CKD EPI equation. This calculation has not been validated in all clinical situations. eGFR's persistently <60 mL/min signify possible Chronic Kidney Disease.    Anion gap 17 (H) 5 - 15  Glucose, capillary     Status: Abnormal   Collection Time: 03/06/17  7:36 AM  Result Value Ref Range   Glucose-Capillary 273 (H) 65 - 99 mg/dL  Blood gas, arterial     Status: Abnormal   Collection Time: 03/06/17  8:30 AM  Result Value Ref Range   FIO2 28.00    pH, Arterial 7.386 7.350 - 7.450   pCO2 arterial 38.5 32.0 - 48.0 mmHg   pO2, Arterial 71.1 (L) 83.0 - 108.0 mmHg   Bicarbonate 22.6 20.0 - 28.0 mmol/L   Acid-base deficit 1.7 0.0 - 2.0 mmol/L   O2 Saturation 89.9 %   Patient temperature 98.6    Allens test (pass/fail) PASS PASS  Glucose, capillary     Status: Abnormal   Collection Time: 03/06/17 11:42 AM  Result Value Ref Range  Glucose-Capillary 293 (H) 65 - 99 mg/dL  Glucose, capillary     Status: Abnormal   Collection Time: 03/06/17  5:26 PM  Result Value Ref Range   Glucose-Capillary 392 (H) 65 - 99 mg/dL  Glucose, capillary     Status: Abnormal   Collection Time: 03/06/17 10:14 PM  Result Value Ref Range   Glucose-Capillary 350 (H) 65 - 99 mg/dL  Renal function panel     Status: Abnormal   Collection Time: 03/07/17  5:55 AM  Result Value Ref Range   Sodium 128 (L) 135 - 145 mmol/L   Potassium 4.8 3.5 - 5.1 mmol/L   Chloride 91  (L) 101 - 111 mmol/L   CO2 20 (L) 22 - 32 mmol/L   Glucose, Bld 336 (H) 65 - 99 mg/dL   BUN 76 (H) 6 - 20 mg/dL   Creatinine, Ser 15.04 (H) 0.61 - 1.24 mg/dL   Calcium 8.6 (L) 8.9 - 10.3 mg/dL   Phosphorus 8.7 (H) 2.5 - 4.6 mg/dL   Albumin 2.0 (L) 3.5 - 5.0 g/dL   GFR calc non Af Amer 3 (L) >60 mL/min   GFR calc Af Amer 4 (L) >60 mL/min    Comment: (NOTE) The eGFR has been calculated using the CKD EPI equation. This calculation has not been validated in all clinical situations. eGFR's persistently <60 mL/min signify possible Chronic Kidney Disease.    Anion gap 17 (H) 5 - 15  CBC     Status: Abnormal   Collection Time: 03/07/17  5:55 AM  Result Value Ref Range   WBC 14.2 (H) 4.0 - 10.5 K/uL   RBC 4.17 (L) 4.22 - 5.81 MIL/uL   Hemoglobin 10.1 (L) 13.0 - 17.0 g/dL   HCT 31.3 (L) 39.0 - 52.0 %   MCV 75.1 (L) 78.0 - 100.0 fL   MCH 24.2 (L) 26.0 - 34.0 pg   MCHC 32.3 30.0 - 36.0 g/dL   RDW 16.2 (H) 11.5 - 15.5 %   Platelets 132 (L) 150 - 400 K/uL  Glucose, capillary     Status: Abnormal   Collection Time: 03/07/17  1:45 PM  Result Value Ref Range   Glucose-Capillary 181 (H) 65 - 99 mg/dL  Glucose, capillary     Status: Abnormal   Collection Time: 03/07/17  4:53 PM  Result Value Ref Range   Glucose-Capillary 220 (H) 65 - 99 mg/dL      Component Value Date/Time   SDES BLOOD LEFT HAND 03/04/2017 1031   SPECREQUEST  03/04/2017 1031    IN PEDIATRIC BOTTLE Blood Culture results may not be optimal due to an excessive volume of blood received in culture bottles   CULT PROTEUS MIRABILIS (A) 03/04/2017 1031   REPTSTATUS 03/07/2017 FINAL 03/04/2017 1031   Ct Abdomen Pelvis W Contrast  Result Date: 03/06/2017 CLINICAL DATA:  Scrotal pain and swelling for about a week. Question abscess. EXAM: CT ABDOMEN AND PELVIS WITH CONTRAST TECHNIQUE: Multidetector CT imaging of the abdomen and pelvis was performed using the standard protocol following bolus administration of intravenous contrast.  CONTRAST:  13m ISOVUE-300 IOPAMIDOL (ISOVUE-300) INJECTION 61% COMPARISON:  Ultrasound exam from 03/04/2017. FINDINGS: Lower chest: Dependent atelectasis in the lower lobes. Hepatobiliary: The liver shows diffusely decreased attenuation suggesting steatosis. Gallbladder decompressed. No intrahepatic or extrahepatic biliary dilation. Pancreas: No focal mass lesion. No dilatation of the main duct. No intraparenchymal cyst. No peripancreatic edema. Spleen: No splenomegaly. No focal mass lesion. Adrenals/Urinary Tract: Bilateral adrenal thickening without nodule or mass. 14 mm  hyperattenuating lesion is identified in the interpolar right kidney (image 45 series 9). No suspicious mass lesion in the left kidney. No evidence for hydroureter. Bladder wall appears thickened although bladder is decompressed. Stomach/Bowel: Stomach is nondistended. No gastric wall thickening. No evidence of outlet obstruction. Duodenum is normally positioned as is the ligament of Treitz. No small bowel wall thickening. No small bowel dilatation. The terminal ileum is normal. The appendix is not visualized, but there is no edema or inflammation in the region of the cecum. Diverticular changes are noted in the left colon without evidence of diverticulitis. Vascular/Lymphatic: There is abdominal aortic atherosclerosis without aneurysm. 13 mm short axis hepatoduodenal ligament lymph node is borderline to mildly enlarged. No retroperitoneal lymphadenopathy. No pelvic sidewall lymphadenopathy. Reproductive: Prostate gland unremarkable. Marked skin thickening and subcutaneous edema noted in the penis and scrotum. No focal or rim enhancing fluid collection is identified to suggest abscess. No evidence for soft tissue gas in the tissues of the perineum. Other: No intraperitoneal free fluid. Musculoskeletal: Bone windows reveal no worrisome lytic or sclerotic osseous lesions. IMPRESSION: 1. Marked skin thickening and subcutaneous edema in the soft  tissues of the penis and scrotum. No focal or rim enhancing fluid collection to suggest abscess. No gas in the scrotum or perineum to suggest Fournier's gangrene. 2. 14 mm hyperattenuating lesion interpolar right kidney could represent neoplasm. MRI without and with contrast recommended to further evaluate. 3. 13 mm short axis hepatoduodenal ligament lymph node upper normal to borderline increased but nonspecific. 4. Bilateral lower lobe atelectasis. 5.  Abdominal Aortic Atherosclerois (ICD10-170.0) 6. Fatty liver with asymmetric enlargement of left hepatic lobe. No contour nodularity to suggest overt cirrhosis. Electronically Signed   By: Misty Stanley M.D.   On: 03/06/2017 16:26   Dg Chest Port 1 View  Result Date: 03/07/2017 CLINICAL DATA:  Dialysis, shortness of breath EXAM: PORTABLE CHEST 1 VIEW COMPARISON:  CT chest 03/04/2017 FINDINGS: There is no focal parenchymal opacity. There is no pleural effusion or pneumothorax. There is stable cardiomegaly. The osseous structures are unremarkable. IMPRESSION: No active disease. Electronically Signed   By: Kathreen Devoid   On: 03/07/2017 09:24   Recent Results (from the past 240 hour(s))  Culture, blood (routine x 2)     Status: None (Preliminary result)   Collection Time: 03/04/17  9:27 AM  Result Value Ref Range Status   Specimen Description BLOOD LEFT ANTECUBITAL  Final   Special Requests   Final    BOTTLES DRAWN AEROBIC AND ANAEROBIC Blood Culture adequate volume   Culture   Final    NO GROWTH 3 DAYS Performed at [facility], [address] [place], [place] [address]    Report Status PENDING  Incomplete  Culture, blood (routine x 2)     Status: Abnormal   Collection Time: 03/04/17 10:31 AM  Result Value Ref Range Status   Specimen Description BLOOD LEFT HAND  Final   Special Requests   Final    IN PEDIATRIC BOTTLE Blood Culture results may not be optimal due to an excessive volume of blood received in culture bottles   Culture  Setup  Time   Final    GRAM NEGATIVE RODS IN PEDIATRIC BOTTLE CRITICAL RESULT CALLED TO, READ BACK BY AND VERIFIED WITH: R RUMBARGER 03/05/17 @ 0914 M VESTAL    Culture PROTEUS MIRABILIS (A)  Final   Report Status 03/07/2017 FINAL  Final   Organism ID, Bacteria PROTEUS MIRABILIS  Final  Susceptibility   Proteus mirabilis - MIC*    AMPICILLIN <=2 SENSITIVE Sensitive     CEFAZOLIN <=4 SENSITIVE Sensitive     CEFEPIME <=1 SENSITIVE Sensitive     CEFTAZIDIME <=1 SENSITIVE Sensitive     CEFTRIAXONE <=1 SENSITIVE Sensitive     CIPROFLOXACIN <=0.25 SENSITIVE Sensitive     GENTAMICIN <=1 SENSITIVE Sensitive     IMIPENEM 4 SENSITIVE Sensitive     TRIMETH/SULFA <=20 SENSITIVE Sensitive     AMPICILLIN/SULBACTAM <=2 SENSITIVE Sensitive     PIP/TAZO <=4 SENSITIVE Sensitive     * PROTEUS MIRABILIS  Blood Culture ID Panel (Reflexed)     Status: Abnormal   Collection Time: 03/04/17 10:31 AM  Result Value Ref Range Status   Enterococcus species NOT DETECTED NOT DETECTED Final   Listeria monocytogenes NOT DETECTED NOT DETECTED Final   Staphylococcus species NOT DETECTED NOT DETECTED Final   Staphylococcus aureus NOT DETECTED NOT DETECTED Final   Streptococcus species NOT DETECTED NOT DETECTED Final   Streptococcus agalactiae NOT DETECTED NOT DETECTED Final   Streptococcus pneumoniae NOT DETECTED NOT DETECTED Final   Streptococcus pyogenes NOT DETECTED NOT DETECTED Final   Acinetobacter baumannii NOT DETECTED NOT DETECTED Final   Enterobacteriaceae species DETECTED (A) NOT DETECTED Final    Comment: Enterobacteriaceae represent a large family of gram-negative bacteria, not a single organism. CRITICAL RESULT CALLED TO, READ BACK BY AND VERIFIED WITH: R RUMBARGER,PHARMD AT 0914 03/05/17 BY L BENFIELD    Enterobacter cloacae complex NOT DETECTED NOT DETECTED Final   Escherichia coli NOT DETECTED NOT DETECTED Final   Klebsiella oxytoca NOT DETECTED NOT DETECTED Final   Klebsiella pneumoniae NOT  DETECTED NOT DETECTED Final   Proteus species DETECTED (A) NOT DETECTED Final    Comment: CRITICAL RESULT CALLED TO, READ BACK BY AND VERIFIED WITH: R RUMBARGER,PHARMD AT 0914 03/05/17 BY M VESTAL    Serratia marcescens NOT DETECTED NOT DETECTED Final   Carbapenem resistance NOT DETECTED NOT DETECTED Final   Haemophilus influenzae NOT DETECTED NOT DETECTED Final   Neisseria meningitidis NOT DETECTED NOT DETECTED Final   Pseudomonas aeruginosa NOT DETECTED NOT DETECTED Final   Candida albicans NOT DETECTED NOT DETECTED Final   Candida glabrata NOT DETECTED NOT DETECTED Final   Candida krusei NOT DETECTED NOT DETECTED Final   Candida parapsilosis NOT DETECTED NOT DETECTED Final   Candida tropicalis NOT DETECTED NOT DETECTED Final    Comment: Performed at [facility], [place] [address], [place], [place] [address]  Respiratory Panel by PCR     Status: None   Collection Time: 03/04/17  2:56 PM  Result Value Ref Range Status   Adenovirus NOT DETECTED NOT DETECTED Final   Coronavirus 229E NOT DETECTED NOT DETECTED Final   Coronavirus HKU1 NOT DETECTED NOT DETECTED Final   Coronavirus NL63 NOT DETECTED NOT DETECTED Final   Coronavirus OC43 NOT DETECTED NOT DETECTED Final   Metapneumovirus NOT DETECTED NOT DETECTED Final   Rhinovirus / Enterovirus NOT DETECTED NOT DETECTED Final   Influenza A NOT DETECTED NOT DETECTED Final   Influenza B NOT DETECTED NOT DETECTED Final   Parainfluenza Virus 1 NOT DETECTED NOT DETECTED Final   Parainfluenza Virus 2 NOT DETECTED NOT DETECTED Final   Parainfluenza Virus 3 NOT DETECTED NOT DETECTED Final   Parainfluenza Virus 4 NOT DETECTED NOT DETECTED Final   Respiratory Syncytial Virus NOT DETECTED NOT DETECTED Final   Bordetella pertussis NOT DETECTED NOT DETECTED Final   Chlamydophila pneumoniae NOT DETECTED NOT DETECTED  Final   Mycoplasma pneumoniae NOT DETECTED NOT DETECTED Final    Comment: Performed at [facility], [place] [address]  [place], [place] [address]  MRSA PCR Screening     Status: None   Collection Time: 03/04/17  9:50 PM  Result Value Ref Range Status   MRSA by PCR NEGATIVE NEGATIVE Final    Comment:        The GeneXpert MRSA Assay (FDA approved for NASAL specimens only), is one component of a comprehensive MRSA colonization surveillance program. It is not intended to diagnose MRSA infection nor to guide or monitor treatment for MRSA infections.       03/07/2017, 5:28 PM     LOS: 3 days    Records and images were personally reviewed where available.

## 2017-03-07 NOTE — Consult Note (Addendum)
[facility] Nurse wound consult note Reason for Consult: Consult requested for scrotum;  Penis and scrotum are very swollen and urology has already been consulted; refer to their progress notes.   Wound type: Patchy areas of partial thickness skin loss to posterior scrotum where skin has been stretched very tight Wound bed: Pink, moist, painful wound bed Drainage (amount, consistency, odor) No odor or drainage Dressing procedure/placement/frequency: Xeroform gauze to promote drying and healing of the affected area.  Discussed plan of care with patient and family member at the bedside and they verbalized understanding. Please re-consult if further assistance is needed.  Thank-you,  Julien Girt MSN, [place], [place], [place], [place]

## 2017-03-07 NOTE — Progress Notes (Signed)
Subjective: Patient reports that he has some decreased pain in his scrotal area.  Objective: Vital signs in last 24 hours: Temp:  [97.6 F (36.4 C)-99.6 F (37.6 C)] 97.6 F (36.4 C) (04/23 1223) Pulse Rate:  [50-79] 68 (04/23 1223) Resp:  [17-23] 23 (04/23 1223) BP: (83-136)/(44-118) 101/62 (04/23 1223) SpO2:  [94 %-99 %] 98 % (04/23 1223) Weight:  [154 kg (339 lb 8 oz)-160.2 kg (353 lb 2.8 oz)] 157.9 kg (348 lb 1.7 oz) (04/23 1223)  Intake/Output from previous day: 04/22 0701 - 04/23 0700 In: 1080 [P.O.:1080] Out: 1 [Stool:1] Intake/Output this shift: No intake/output data recorded.  Physical Exam:  Constitutional: Vital signs reviewed. WD WN in NAD   Eyes: PERRL, No scleral icterus.   Pulmonary/Chest: Normal effort Abdominal: Soft. Non-tender, non-distended, bowel sounds are normal, no masses, organomegaly, or guarding present.  Genitourinary: Persistent scrotal and penile swelling.  The scrotal swelling is somewhat less.  There is still an excoriated area in his posterior scrotum.  No obvious crepitus.  No obvious fluctuant area.  The skin anteriorly is somewhat dry/scaly, indicating resolving edema/infection.  Lab Results:  Recent Labs  03/04/17 1457 03/06/17 0701 03/07/17 0555  HGB 10.7* 9.7* 10.1*  HCT 33.8* 30.4* 31.3*   BMET  Recent Labs  03/06/17 0701 03/07/17 0555  NA 128* 128*  K 4.2 4.8  CL 89* 91*  CO2 22 20*  GLUCOSE 275* 336*  BUN 57* 76*  CREATININE 13.56* 15.04*  CALCIUM 9.0 8.6*   No results for input(s): LABPT, INR in the last 72 hours. No results for input(s): LABURIN in the last 72 hours. Results for orders placed or performed during the hospital encounter of 03/04/17  Culture, blood (routine x 2)     Status: None (Preliminary result)   Collection Time: 03/04/17  9:27 AM  Result Value Ref Range Status   Specimen Description BLOOD LEFT ANTECUBITAL  Final   Special Requests   Final    BOTTLES DRAWN AEROBIC AND ANAEROBIC Blood  Culture adequate volume   Culture   Final    NO GROWTH 2 DAYS Performed at [facility], [place] [address] [place], [place] [address]    Report Status PENDING  Incomplete  Culture, blood (routine x 2)     Status: Abnormal (Preliminary result)   Collection Time: 03/04/17 10:31 AM  Result Value Ref Range Status   Specimen Description BLOOD LEFT HAND  Final   Special Requests   Final    IN PEDIATRIC BOTTLE Blood Culture results may not be optimal due to an excessive volume of blood received in culture bottles   Culture  Setup Time   Final    GRAM NEGATIVE RODS IN PEDIATRIC BOTTLE CRITICAL RESULT CALLED TO, READ BACK BY AND VERIFIED WITH: R RUMBARGER 03/05/17 @ 0914 M VESTAL    Culture (A)  Final    PROTEUS MIRABILIS SUSCEPTIBILITIES TO FOLLOW Performed at [facility], [place] [address], [place], [place] [address]    Report Status PENDING  Incomplete  Blood Culture ID Panel (Reflexed)     Status: Abnormal   Collection Time: 03/04/17 10:31 AM  Result Value Ref Range Status   Enterococcus species NOT DETECTED NOT DETECTED Final   Listeria monocytogenes NOT DETECTED NOT DETECTED Final   Staphylococcus species NOT DETECTED NOT DETECTED Final   Staphylococcus aureus NOT DETECTED NOT DETECTED Final   Streptococcus species NOT DETECTED NOT DETECTED Final   Streptococcus agalactiae NOT DETECTED NOT DETECTED Final  Streptococcus pneumoniae NOT DETECTED NOT DETECTED Final   Streptococcus pyogenes NOT DETECTED NOT DETECTED Final   Acinetobacter baumannii NOT DETECTED NOT DETECTED Final   Enterobacteriaceae species DETECTED (A) NOT DETECTED Final    Comment: Enterobacteriaceae represent a large family of gram-negative bacteria, not a single organism. CRITICAL RESULT CALLED TO, READ BACK BY AND VERIFIED WITH: R RUMBARGER,PHARMD AT 0914 03/05/17 BY L BENFIELD    Enterobacter cloacae complex NOT DETECTED NOT DETECTED Final   Escherichia coli NOT DETECTED NOT DETECTED Final   Klebsiella  oxytoca NOT DETECTED NOT DETECTED Final   Klebsiella pneumoniae NOT DETECTED NOT DETECTED Final   Proteus species DETECTED (A) NOT DETECTED Final    Comment: CRITICAL RESULT CALLED TO, READ BACK BY AND VERIFIED WITH: R RUMBARGER,PHARMD AT 0914 03/05/17 BY M VESTAL    Serratia marcescens NOT DETECTED NOT DETECTED Final   Carbapenem resistance NOT DETECTED NOT DETECTED Final   Haemophilus influenzae NOT DETECTED NOT DETECTED Final   Neisseria meningitidis NOT DETECTED NOT DETECTED Final   Pseudomonas aeruginosa NOT DETECTED NOT DETECTED Final   Candida albicans NOT DETECTED NOT DETECTED Final   Candida glabrata NOT DETECTED NOT DETECTED Final   Candida krusei NOT DETECTED NOT DETECTED Final   Candida parapsilosis NOT DETECTED NOT DETECTED Final   Candida tropicalis NOT DETECTED NOT DETECTED Final    Comment: Performed at [facility], [place] [address] [place], [place] [address]  Respiratory Panel by PCR     Status: None   Collection Time: 03/04/17  2:56 PM  Result Value Ref Range Status   Adenovirus NOT DETECTED NOT DETECTED Final   Coronavirus 229E NOT DETECTED NOT DETECTED Final   Coronavirus HKU1 NOT DETECTED NOT DETECTED Final   Coronavirus NL63 NOT DETECTED NOT DETECTED Final   Coronavirus OC43 NOT DETECTED NOT DETECTED Final   Metapneumovirus NOT DETECTED NOT DETECTED Final   Rhinovirus / Enterovirus NOT DETECTED NOT DETECTED Final   Influenza A NOT DETECTED NOT DETECTED Final   Influenza B NOT DETECTED NOT DETECTED Final   Parainfluenza Virus 1 NOT DETECTED NOT DETECTED Final   Parainfluenza Virus 2 NOT DETECTED NOT DETECTED Final   Parainfluenza Virus 3 NOT DETECTED NOT DETECTED Final   Parainfluenza Virus 4 NOT DETECTED NOT DETECTED Final   Respiratory Syncytial Virus NOT DETECTED NOT DETECTED Final   Bordetella pertussis NOT DETECTED NOT DETECTED Final   Chlamydophila pneumoniae NOT DETECTED NOT DETECTED Final   Mycoplasma pneumoniae NOT DETECTED NOT DETECTED Final     Comment: Performed at [facility], [place] [address] [place], [place] [address]  MRSA PCR Screening     Status: None   Collection Time: 03/04/17  9:50 PM  Result Value Ref Range Status   MRSA by PCR NEGATIVE NEGATIVE Final    Comment:        The GeneXpert MRSA Assay (FDA approved for NASAL specimens only), is one component of a comprehensive MRSA colonization surveillance program. It is not intended to diagnose MRSA infection nor to guide or monitor treatment for MRSA infections.     Studies/Results: Ct Abdomen Pelvis W Contrast  Result Date: 03/06/2017 CLINICAL DATA:  Scrotal pain and swelling for about a week. Question abscess. EXAM: CT ABDOMEN AND PELVIS WITH CONTRAST TECHNIQUE: Multidetector CT imaging of the abdomen and pelvis was performed using the standard protocol following bolus administration of intravenous contrast. CONTRAST:  142mL ISOVUE-300 IOPAMIDOL (ISOVUE-300) INJECTION 61% COMPARISON:  Ultrasound exam from 03/04/2017. FINDINGS: Lower chest: Dependent atelectasis  in the lower lobes. Hepatobiliary: The liver shows diffusely decreased attenuation suggesting steatosis. Gallbladder decompressed. No intrahepatic or extrahepatic biliary dilation. Pancreas: No focal mass lesion. No dilatation of the main duct. No intraparenchymal cyst. No peripancreatic edema. Spleen: No splenomegaly. No focal mass lesion. Adrenals/Urinary Tract: Bilateral adrenal thickening without nodule or mass. 14 mm hyperattenuating lesion is identified in the interpolar right kidney (image 45 series 9). No suspicious mass lesion in the left kidney. No evidence for hydroureter. Bladder wall appears thickened although bladder is decompressed. Stomach/Bowel: Stomach is nondistended. No gastric wall thickening. No evidence of outlet obstruction. Duodenum is normally positioned as is the ligament of Treitz. No small bowel wall thickening. No small bowel dilatation. The terminal ileum is normal. The appendix is  not visualized, but there is no edema or inflammation in the region of the cecum. Diverticular changes are noted in the left colon without evidence of diverticulitis. Vascular/Lymphatic: There is abdominal aortic atherosclerosis without aneurysm. 13 mm short axis hepatoduodenal ligament lymph node is borderline to mildly enlarged. No retroperitoneal lymphadenopathy. No pelvic sidewall lymphadenopathy. Reproductive: Prostate gland unremarkable. Marked skin thickening and subcutaneous edema noted in the penis and scrotum. No focal or rim enhancing fluid collection is identified to suggest abscess. No evidence for soft tissue gas in the tissues of the perineum. Other: No intraperitoneal free fluid. Musculoskeletal: Bone windows reveal no worrisome lytic or sclerotic osseous lesions. IMPRESSION: 1. Marked skin thickening and subcutaneous edema in the soft tissues of the penis and scrotum. No focal or rim enhancing fluid collection to suggest abscess. No gas in the scrotum or perineum to suggest Fournier's gangrene. 2. 14 mm hyperattenuating lesion interpolar right kidney could represent neoplasm. MRI without and with contrast recommended to further evaluate. 3. 13 mm short axis hepatoduodenal ligament lymph node upper normal to borderline increased but nonspecific. 4. Bilateral lower lobe atelectasis. 5.  Abdominal Aortic Atherosclerois (ICD10-170.0) 6. Fatty liver with asymmetric enlargement of left hepatic lobe. No contour nodularity to suggest overt cirrhosis. Electronically Signed   By: Misty Stanley M.D.   On: 03/06/2017 16:26   Dg Chest Port 1 View  Result Date: 03/07/2017 CLINICAL DATA:  Dialysis, shortness of breath EXAM: PORTABLE CHEST 1 VIEW COMPARISON:  CT chest 03/04/2017 FINDINGS: There is no focal parenchymal opacity. There is no pleural effusion or pneumothorax. There is stable cardiomegaly. The osseous structures are unremarkable. IMPRESSION: No active disease. Electronically Signed   By: Kathreen Devoid   On: 03/07/2017 09:24   I placed a 16 French Foley catheter with significant difficulty due to the significant foreskin edema.  I held pressure on the patient's penis for approximately 5 minutes to decrease edema. I was able to find the urethral meatus and place catheter.  Approximately 25 mL of old discolored urine was obtained.  This was sent for culture.  The catheter will be left in at least temporarily for bladder irrigations.  I reviewed the patient's CT scan from yesterday.  There is no obvious abscess within the scrotum.  No obvious perirectal or perianal process. Assessment/Plan:   Scrotal infection/edema, somewhat improving.  He also has bacteremia, secondary to Proteus.  Possible source is urine from pyocystis.    I will leave the catheter in for daily irrigations with saline.    Urine was cultured    Continue close observation of scrotal/genital process.   LOS: 3 days   Franchot Gallo M 03/07/2017, 1:10 PM

## 2017-03-07 NOTE — Progress Notes (Signed)
Pharmacy Antibiotic Note  [name] is a 61 y.o. male admitted on 03/04/2017 with sepsis and a possible scrotal infection.  Pharmacy has been consulted for cefazolin dosing.  ID recommends 2 weeks of Ancef with HD. Afebrile, WBC down to 14.2.  Plan: Give cefazolin 2g IV x 1 tonight, then start cefazolin 2g IV QHD-MWF May need to repeat dose tomorrow if getting extra HD per Renal Monitor clinical picture, renal function   Height: 6' (182.9 cm) Weight: (!) 348 lb 1.7 oz (157.9 kg) IBW/kg (Calculated) : 77.6  Temp (24hrs), Avg:98.4 F (36.9 C), Min:97.6 F (36.4 C), Max:99.6 F (37.6 C)   Recent Labs Lab 03/04/17 0930 03/04/17 0953 03/04/17 1317 03/04/17 1457 03/04/17 1700 03/05/17 0900 03/06/17 0701 03/07/17 0555  WBC 23.2*  --   --  19.1*  --   --  18.1* 14.2*  CREATININE 11.68*  --   --  11.96*  --  14.60* 13.56* 15.04*  LATICACIDVEN  --  3.30* 1.94*  --  1.5  --   --   --   VANCORANDOM  --   --   --   --   --   --  37  --     Estimated Creatinine Clearance: 8.2 mL/min (A) (by C-G formula based on SCr of 15.04 mg/dL (H)).    Allergies  Allergen Reactions  . Penicillins Nausea And Vomiting and Other (See Comments)    Has patient had a PCN reaction causing immediate rash, facial/tongue/throat swelling, SOB or lightheadedness with hypotension: No Has patient had a PCN reaction causing severe rash involving mucus membranes or skin necrosis: No Has patient had a PCN reaction that required hospitalization No Has patient had a PCN reaction occurring within the last 10 years: No If all of the above answers are "NO", then may proceed with Cephalosporin use.  . Vioxx [Rofecoxib] Nausea And Vomiting    Elenor Quinones, PharmD, BCPS Clinical Pharmacist Pager [phone] 03/07/2017 6:00 PM

## 2017-03-07 NOTE — Progress Notes (Signed)
2D Echocardiogram has been performed.  [name] 03/07/2017, 3:14 PM

## 2017-03-07 NOTE — Procedures (Signed)
Patient seen on Hemodialysis. QB 350, UF goal 3L Treatment adjusted as needed.  Elmarie Shiley MD [facility]. Office # [phone] Pager # [phone] 10:14 AM

## 2017-03-07 NOTE — Progress Notes (Signed)
PROGRESS NOTE    [name]  FFM:[id number] DOB: 1956-06-11 DOA: 03/04/2017 PCP: Gara Kroner, MD   Brief Narrative:  [name] is a 61 y.o. male with medical history of ESRD on HD, MGUS, Gout, Hep C (recently treated), morbid obesity, sleep apnea who presents from dialysis for sudden shortness of breath. The patient keeps falling asleep and so the wife helps with history. His BP apparently kept dropping while in dialysis. He completed dialysis. 1 pound of fluid was removed to bring him back to his baseline weight. Due to severe dyspnea he was sent to the hospital. No cough, congestion, runny nose or sore thoat. No abdominal pain. No fever or chills.  When I am about to leave the room his wife tells me that she has been trying to get him to go to the doctor for 3 days now mainly for vomiting and diarrhea. They ate out at a restaurant on the day of the [place] (Sunday) and he has since had diarrhea and vomiting. Yesterday he felt exhausted and could not go to work. He vomited again last night.   He also states that his scrotum has been swollen for a few days now. It did not hurt until it was palpated in the ER.     Assessment & Plan:   Principal Problem:   Acute respiratory failure with hypoxia (HCC) Active Problems:   Hypertension   Hyperlipidemia   Gout   Obesity   Diabetes mellitus (HCC)   Monoclonal gammopathies   End stage renal disease (HCC)   Scrotal swelling   ESRD (end stage renal disease) on dialysis (HCC)   Scrotal pain   Sepsis (HCC)   Gram-neg septicemia (HCC)   Scrotal edema  #1 acute respiratory failure with hypoxia Questionable etiology. Patient is a morbidly obese gentleman who had presented with hypotension, elevated lactic acid level shortness of breath testicular pain and swelling. Concern for sepsis. Patient states some improvement with shortness of breath after hemodialysis. Influenza PCR is negative. Patient refused C Pap overnight. ABG  essentially normal. Patient with her hypoxia. Blood cultures 1 of 2 positive for Proteus species. Also concerning for epididymitis. CT angiogram chest negative for PE. Patient currently on empiric IV Rocephin. Check a 2-D echo. IV vancomycin has been discontinued. Patient has been seen in consultation by pulmonary. Follow.  #2 probable Proteus bacteremia/Sepsis Blood cultures 1/2 positive for Proteus. Likely may have seeded from the urine. Patient noted to have fevers and hypotension on admission. CT abdomen and pelvis has been ordered per nephrology. Patient currently on IV Rocephin. MRSA PCR negative. IV vancomycin has been discontinued. Consult with ID for further evaluation and recommendations.  #3 hypertension Holding antihypertensive medications.  #4 scrotal swelling/scrotal infection Patient has been seen in association by urology. Blood cultures growing Proteus. CT abdomen and pelvis has been ordered per nephrology. Continue empiric IV antibiotics. Urology feels dialysis will help with some of the scrotal edema. Elevate scrotum.  #5 diabetes mellitus Hemoglobin A1c pending. CBGs have ranged from 273-392. Increase meal coverage insulin to 20 units 3 times a day. Place on sliding scale insulin. Increase Levemir to 30 units BID  #6 hyperlipidemia Continue statin.  #7 gout Continue allopurinol.  #8 morbid obesity  #9 obstructive sleep apnea Patient per wife did not get C Pap overnight. Patient has been advised that it's in his best interest to use CPAP at bedtime.  #10 hepatitis C Treated.  DVT prophylaxis: Heparin Code Status: Full Family Communication: Updated patient  and family at bedside. Disposition Plan: Home once shortness of breath has improved, clinical improvement and per nephrology and urology and pending ID recommendations..   Consultants:   Urology: Dr. Diona Fanti 03/04/2017  Pulmonary: Dr. Melvyn Novas 03/05/2017  Nephrology: Dr.Schertz 03/04/2017  Infectious  diseases: Dr. Johnnye Sima pending 03/07/2017  Procedures:   CT angiogram chest 03/04/2017  Chest x-ray 03/04/2017  Ultrasound of the scrotum 03/04/2017  Antimicrobials:   IV vancomycin 03/04/2017>>>>>> 03/06/2017  IV Zosyn 03/04/2017>>>> 03/05/2017  IV Rocephin 03/05/2017   Subjective: Patient in hemodialysis with CPAP on. Patient states was short of breath last night and early on this morning however has some improvement during hemodialysis. No chest pain.   Objective: Vitals:   03/07/17 1115 03/07/17 1135 03/07/17 1205 03/07/17 1223  BP: (!) 102/48 94/64 (!) 92/52 101/62  Pulse: 70 72 71 68  Resp:    (!) 23  Temp:    97.6 F (36.4 C)  TempSrc:    Axillary  SpO2:    98%  Weight:    (!) 157.9 kg (348 lb 1.7 oz)  Height:        Intake/Output Summary (Last 24 hours) at 03/07/17 1250 Last data filed at 03/07/17 0950  Gross per 24 hour  Intake              840 ml  Output                1 ml  Net              839 ml   Filed Weights   03/06/17 2216 03/07/17 0653 03/07/17 1223  Weight: (!) 154 kg (339 lb 8 oz) (!) 160.2 kg (353 lb 2.8 oz) (!) 157.9 kg (348 lb 1.7 oz)    Examination:  General exam: In HD  Respiratory system: Clear to auscultation anterior lung fields.. Decreased breath sounds in the bases. Respiratory effort normal. Cardiovascular system: S1 & S2 heard, RRR. No JVD, murmurs, rubs, gallops or clicks. No pedal edema. Gastrointestinal system: Abdomen is nondistended, soft and nontender. No organomegaly or masses felt. Normal bowel sounds heard. Central nervous system: Alert and oriented. No focal neurological deficits. Extremities: Symmetric 5 x 5 power. Genitourinary: Edematous scrotum and penis. Skin: No rashes, lesions or ulcers Psychiatry: Judgement and insight appear normal. Mood & affect appropriate.     Data Reviewed: I have personally reviewed following labs and imaging studies  CBC:  Recent Labs Lab 03/04/17 0930 03/04/17 1457  03/06/17 0701 03/07/17 0555  WBC 23.2* 19.1* 18.1* 14.2*  NEUTROABS 18.7*  --  14.6*  --   HGB 11.2* 10.7* 9.7* 10.1*  HCT 34.8* 33.8* 30.4* 31.3*  MCV 78.2 77.3* 75.8* 75.1*  PLT 140* 146* 126* 998*   Basic Metabolic Panel:  Recent Labs Lab 03/04/17 0930 03/04/17 1457 03/05/17 0900 03/06/17 0701 03/07/17 0555  NA 132*  --  130* 128* 128*  K 4.1  --  5.0 4.2 4.8  CL 89*  --  87* 89* 91*  CO2 26  --  22 22 20*  GLUCOSE 322*  --  337* 275* 336*  BUN 44*  --  56* 57* 76*  CREATININE 11.68* 11.96* 14.60* 13.56* 15.04*  CALCIUM 8.5*  --  9.3 9.0 8.6*  PHOS  --   --  7.9* 7.7* 8.7*   GFR: Estimated Creatinine Clearance: 8.2 mL/min (A) (by C-G formula based on SCr of 15.04 mg/dL (H)). Liver Function Tests:  Recent Labs Lab 03/04/17 0930 03/05/17 0900 03/06/17 0701  03/07/17 0555  AST 49*  --   --   --   ALT 24  --   --   --   ALKPHOS 125  --   --   --   BILITOT 0.6  --   --   --   PROT 8.2*  --   --   --   ALBUMIN 2.9* 2.4* 2.1* 2.0*   No results for input(s): LIPASE, AMYLASE in the last 168 hours. No results for input(s): AMMONIA in the last 168 hours. Coagulation Profile:  Recent Labs Lab 03/04/17 0930  INR 1.21   Cardiac Enzymes: No results for input(s): CKTOTAL, CKMB, CKMBINDEX, TROPONINI in the last 168 hours. BNP (last 3 results) No results for input(s): PROBNP in the last 8760 hours. HbA1C: No results for input(s): HGBA1C in the last 72 hours. CBG:  Recent Labs Lab 03/05/17 2149 03/06/17 0736 03/06/17 1142 03/06/17 1726 03/06/17 2214  GLUCAP 313* 273* 293* 392* 350*   Lipid Profile: No results for input(s): CHOL, HDL, LDLCALC, TRIG, CHOLHDL, LDLDIRECT in the last 72 hours. Thyroid Function Tests: No results for input(s): TSH, T4TOTAL, FREET4, T3FREE, THYROIDAB in the last 72 hours. Anemia Panel: No results for input(s): VITAMINB12, FOLATE, FERRITIN, TIBC, IRON, RETICCTPCT in the last 72 hours. Sepsis Labs:  Recent Labs Lab 03/04/17 3267  03/04/17 1317 03/04/17 1700  LATICACIDVEN 3.30* 1.94* 1.5    Recent Results (from the past 240 hour(s))  Culture, blood (routine x 2)     Status: None (Preliminary result)   Collection Time: 03/04/17  9:27 AM  Result Value Ref Range Status   Specimen Description BLOOD LEFT ANTECUBITAL  Final   Special Requests   Final    BOTTLES DRAWN AEROBIC AND ANAEROBIC Blood Culture adequate volume   Culture   Final    NO GROWTH 2 DAYS Performed at [facility], [place] [address], [place], [place] [address]    Report Status PENDING  Incomplete  Culture, blood (routine x 2)     Status: Abnormal (Preliminary result)   Collection Time: 03/04/17 10:31 AM  Result Value Ref Range Status   Specimen Description BLOOD LEFT HAND  Final   Special Requests   Final    IN PEDIATRIC BOTTLE Blood Culture results may not be optimal due to an excessive volume of blood received in culture bottles   Culture  Setup Time   Final    GRAM NEGATIVE RODS IN PEDIATRIC BOTTLE CRITICAL RESULT CALLED TO, READ BACK BY AND VERIFIED WITH: R RUMBARGER 03/05/17 @ 0914 M VESTAL    Culture (A)  Final    PROTEUS MIRABILIS SUSCEPTIBILITIES TO FOLLOW Performed at [facility], [place] [address], [place], [place] [address]    Report Status PENDING  Incomplete  Blood Culture ID Panel (Reflexed)     Status: Abnormal   Collection Time: 03/04/17 10:31 AM  Result Value Ref Range Status   Enterococcus species NOT DETECTED NOT DETECTED Final   Listeria monocytogenes NOT DETECTED NOT DETECTED Final   Staphylococcus species NOT DETECTED NOT DETECTED Final   Staphylococcus aureus NOT DETECTED NOT DETECTED Final   Streptococcus species NOT DETECTED NOT DETECTED Final   Streptococcus agalactiae NOT DETECTED NOT DETECTED Final   Streptococcus pneumoniae NOT DETECTED NOT DETECTED Final   Streptococcus pyogenes NOT DETECTED NOT DETECTED Final   Acinetobacter baumannii NOT DETECTED NOT DETECTED Final   Enterobacteriaceae species  DETECTED (A) NOT DETECTED Final    Comment: Enterobacteriaceae represent a large family  of gram-negative bacteria, not a single organism. CRITICAL RESULT CALLED TO, READ BACK BY AND VERIFIED WITH: R RUMBARGER,PHARMD AT 0914 03/05/17 BY L BENFIELD    Enterobacter cloacae complex NOT DETECTED NOT DETECTED Final   Escherichia coli NOT DETECTED NOT DETECTED Final   Klebsiella oxytoca NOT DETECTED NOT DETECTED Final   Klebsiella pneumoniae NOT DETECTED NOT DETECTED Final   Proteus species DETECTED (A) NOT DETECTED Final    Comment: CRITICAL RESULT CALLED TO, READ BACK BY AND VERIFIED WITH: R RUMBARGER,PHARMD AT 0914 03/05/17 BY M VESTAL    Serratia marcescens NOT DETECTED NOT DETECTED Final   Carbapenem resistance NOT DETECTED NOT DETECTED Final   Haemophilus influenzae NOT DETECTED NOT DETECTED Final   Neisseria meningitidis NOT DETECTED NOT DETECTED Final   Pseudomonas aeruginosa NOT DETECTED NOT DETECTED Final   Candida albicans NOT DETECTED NOT DETECTED Final   Candida glabrata NOT DETECTED NOT DETECTED Final   Candida krusei NOT DETECTED NOT DETECTED Final   Candida parapsilosis NOT DETECTED NOT DETECTED Final   Candida tropicalis NOT DETECTED NOT DETECTED Final    Comment: Performed at [facility], [place] [address], [place], [place] [address]  Respiratory Panel by PCR     Status: None   Collection Time: 03/04/17  2:56 PM  Result Value Ref Range Status   Adenovirus NOT DETECTED NOT DETECTED Final   Coronavirus 229E NOT DETECTED NOT DETECTED Final   Coronavirus HKU1 NOT DETECTED NOT DETECTED Final   Coronavirus NL63 NOT DETECTED NOT DETECTED Final   Coronavirus OC43 NOT DETECTED NOT DETECTED Final   Metapneumovirus NOT DETECTED NOT DETECTED Final   Rhinovirus / Enterovirus NOT DETECTED NOT DETECTED Final   Influenza A NOT DETECTED NOT DETECTED Final   Influenza B NOT DETECTED NOT DETECTED Final   Parainfluenza Virus 1 NOT DETECTED NOT DETECTED Final   Parainfluenza Virus 2 NOT  DETECTED NOT DETECTED Final   Parainfluenza Virus 3 NOT DETECTED NOT DETECTED Final   Parainfluenza Virus 4 NOT DETECTED NOT DETECTED Final   Respiratory Syncytial Virus NOT DETECTED NOT DETECTED Final   Bordetella pertussis NOT DETECTED NOT DETECTED Final   Chlamydophila pneumoniae NOT DETECTED NOT DETECTED Final   Mycoplasma pneumoniae NOT DETECTED NOT DETECTED Final    Comment: Performed at [facility], [place] [address], [place], [place] [address]  MRSA PCR Screening     Status: None   Collection Time: 03/04/17  9:50 PM  Result Value Ref Range Status   MRSA by PCR NEGATIVE NEGATIVE Final    Comment:        The GeneXpert MRSA Assay (FDA approved for NASAL specimens only), is one component of a comprehensive MRSA colonization surveillance program. It is not intended to diagnose MRSA infection nor to guide or monitor treatment for MRSA infections.          Radiology Studies: Ct Abdomen Pelvis W Contrast  Result Date: 03/06/2017 CLINICAL DATA:  Scrotal pain and swelling for about a week. Question abscess. EXAM: CT ABDOMEN AND PELVIS WITH CONTRAST TECHNIQUE: Multidetector CT imaging of the abdomen and pelvis was performed using the standard protocol following bolus administration of intravenous contrast. CONTRAST:  176mL ISOVUE-300 IOPAMIDOL (ISOVUE-300) INJECTION 61% COMPARISON:  Ultrasound exam from 03/04/2017. FINDINGS: Lower chest: Dependent atelectasis in the lower lobes. Hepatobiliary: The liver shows diffusely decreased attenuation suggesting steatosis. Gallbladder decompressed. No intrahepatic or extrahepatic biliary dilation. Pancreas: No focal mass lesion. No dilatation of the main duct. No intraparenchymal cyst. No peripancreatic edema.  Spleen: No splenomegaly. No focal mass lesion. Adrenals/Urinary Tract: Bilateral adrenal thickening without nodule or mass. 14 mm hyperattenuating lesion is identified in the interpolar right kidney (image 45 series 9). No suspicious mass  lesion in the left kidney. No evidence for hydroureter. Bladder wall appears thickened although bladder is decompressed. Stomach/Bowel: Stomach is nondistended. No gastric wall thickening. No evidence of outlet obstruction. Duodenum is normally positioned as is the ligament of Treitz. No small bowel wall thickening. No small bowel dilatation. The terminal ileum is normal. The appendix is not visualized, but there is no edema or inflammation in the region of the cecum. Diverticular changes are noted in the left colon without evidence of diverticulitis. Vascular/Lymphatic: There is abdominal aortic atherosclerosis without aneurysm. 13 mm short axis hepatoduodenal ligament lymph node is borderline to mildly enlarged. No retroperitoneal lymphadenopathy. No pelvic sidewall lymphadenopathy. Reproductive: Prostate gland unremarkable. Marked skin thickening and subcutaneous edema noted in the penis and scrotum. No focal or rim enhancing fluid collection is identified to suggest abscess. No evidence for soft tissue gas in the tissues of the perineum. Other: No intraperitoneal free fluid. Musculoskeletal: Bone windows reveal no worrisome lytic or sclerotic osseous lesions. IMPRESSION: 1. Marked skin thickening and subcutaneous edema in the soft tissues of the penis and scrotum. No focal or rim enhancing fluid collection to suggest abscess. No gas in the scrotum or perineum to suggest Fournier's gangrene. 2. 14 mm hyperattenuating lesion interpolar right kidney could represent neoplasm. MRI without and with contrast recommended to further evaluate. 3. 13 mm short axis hepatoduodenal ligament lymph node upper normal to borderline increased but nonspecific. 4. Bilateral lower lobe atelectasis. 5.  Abdominal Aortic Atherosclerois (ICD10-170.0) 6. Fatty liver with asymmetric enlargement of left hepatic lobe. No contour nodularity to suggest overt cirrhosis. Electronically Signed   By: Misty Stanley M.D.   On: 03/06/2017 16:26    Dg Chest Port 1 View  Result Date: 03/07/2017 CLINICAL DATA:  Dialysis, shortness of breath EXAM: PORTABLE CHEST 1 VIEW COMPARISON:  CT chest 03/04/2017 FINDINGS: There is no focal parenchymal opacity. There is no pleural effusion or pneumothorax. There is stable cardiomegaly. The osseous structures are unremarkable. IMPRESSION: No active disease. Electronically Signed   By: Kathreen Devoid   On: 03/07/2017 09:24        Scheduled Meds: . allopurinol  100 mg Oral BID  . aspirin EC  81 mg Oral Daily  . calcitRIOL  3.5 mcg Oral Q M,W,F-HD  . cinacalcet  60 mg Oral Q breakfast  . darbepoetin (ARANESP) injection - DIALYSIS  40 mcg Intravenous Q Mon-HD  . ferric citrate  630 mg Oral TID WC  . gabapentin  200 mg Oral BID  . insulin aspart  0-9 Units Subcutaneous TID WC  . insulin aspart  20 Units Subcutaneous TID WC  . insulin detemir  30 Units Subcutaneous BID  . pravastatin  40 mg Oral QHS   Continuous Infusions: . cefTRIAXone (ROCEPHIN)  IV Stopped (03/05/17 1849)  . [START ON 03/09/2017] ferric gluconate (FERRLECIT/NULECIT) IV       LOS: 3 days    Time spent: 35 minutes    THOMPSON,DANIEL, MD [facility] Pager [phone]  If 7PM-7AM, please contact night-coverage [email or web address] Password [facility] 03/07/2017, 12:50 PM

## 2017-03-07 NOTE — Plan of Care (Signed)
Problem: Skin Integrity: Goal: Risk for impaired skin integrity will decrease Outcome: Progressing Wound care orders received, implemented and explained to patient. Patient verbalized understanding.

## 2017-03-07 NOTE — Progress Notes (Signed)
Patient ID: [name], male   DOB: 04-07-1956, 61 y.o.   MRN: [id number]  [facility] Progress Note   Assessment/ Plan:   1. Hypotension/fever/scrotal and penile pain with swelling: on BS ABx and CT pelvis yesterday showed subcutaneous swelling over penis and scrotum without focal abscess or evidence of Fournier's. Suspicious renal lesion noted and likely to be addressed later as OP by urology. No indications for surgery noted 2. ESRD: continue HD on MWF schedule with supplemental treatments for volume excess--anticipated HD again tomorrow to improve oxygenation. Hyponatremia likely from hyperglycemia and high IDWG. 3. Anemia:borderline hgb, continue ESA 4. CKD-MBD:elevated phosphorus levels noted- restarted binders and sensipar 5. Hypertension:fair BP control, monitor with HD/UF  Subjective:   Concerns raised with some shortness of breath/chest pressure and abnormal EKG this AM for which he is now getting dialysis for UF and is on CPAP for OHS/OSA   Objective:   BP (!) 93/52 (BP Location: Left Arm)   Pulse 69   Temp 97.6 F (36.4 C) (Oral)   Resp (!) 21   Ht 6' (1.829 m)   Wt (!) 160.2 kg (353 lb 2.8 oz)   SpO2 96%   BMI 47.90 kg/m   Physical Exam: KFE:[id number] on dialysis BUY:[id number] regular rhythm and normal rate (71) Resp:transmitted sounds bilaterally with fine rales KRC:VKFM, obese, NT Ext:No LE edema  Labs: BMET  Recent Labs Lab 03/04/17 0930 03/04/17 1457 03/05/17 0900 03/06/17 0701 03/07/17 0555  NA 132*  --  130* 128* 128*  K 4.1  --  5.0 4.2 4.8  CL 89*  --  87* 89* 91*  CO2 26  --  22 22 20*  GLUCOSE 322*  --  337* 275* 336*  BUN 44*  --  56* 57* 76*  CREATININE 11.68* 11.96* 14.60* 13.56* 15.04*  CALCIUM 8.5*  --  9.3 9.0 8.6*  PHOS  --   --  7.9* 7.7* 8.7*   CBC  Recent Labs Lab 03/04/17 0930 03/04/17 1457 03/06/17 0701 03/07/17 0555  WBC 23.2* 19.1* 18.1* 14.2*  NEUTROABS 18.7*  --  14.6*  --   HGB 11.2* 10.7* 9.7*  10.1*  HCT 34.8* 33.8* 30.4* 31.3*  MCV 78.2 77.3* 75.8* 75.1*  PLT 140* 146* 126* 132*   Medications:    . allopurinol  100 mg Oral BID  . aspirin EC  81 mg Oral Daily  . calcitRIOL  3.5 mcg Oral Q M,W,F-HD  . cinacalcet  60 mg Oral Q breakfast  . darbepoetin (ARANESP) injection - DIALYSIS  40 mcg Intravenous Q Mon-HD  . ferric citrate  630 mg Oral TID WC  . gabapentin  200 mg Oral BID  . insulin aspart  0-9 Units Subcutaneous TID WC  . insulin aspart  20 Units Subcutaneous TID WC  . insulin detemir  30 Units Subcutaneous BID  . pravastatin  40 mg Oral QHS   Elmarie Shiley, MD 03/07/2017, 10:04 AM

## 2017-03-08 LAB — CBC
HEMATOCRIT: 30.6 % — AB (ref 39.0–52.0)
HEMOGLOBIN: 9.7 g/dL — AB (ref 13.0–17.0)
MCH: 24 pg — ABNORMAL LOW (ref 26.0–34.0)
MCHC: 31.7 g/dL (ref 30.0–36.0)
MCV: 75.6 fL — ABNORMAL LOW (ref 78.0–100.0)
Platelets: 166 10*3/uL (ref 150–400)
RBC: 4.05 MIL/uL — AB (ref 4.22–5.81)
RDW: 16.1 % — AB (ref 11.5–15.5)
WBC: 16.9 10*3/uL — AB (ref 4.0–10.5)

## 2017-03-08 LAB — RENAL FUNCTION PANEL
ALBUMIN: 1.9 g/dL — AB (ref 3.5–5.0)
ANION GAP: 15 (ref 5–15)
BUN: 48 mg/dL — ABNORMAL HIGH (ref 6–20)
CO2: 24 mmol/L (ref 22–32)
Calcium: 8.3 mg/dL — ABNORMAL LOW (ref 8.9–10.3)
Chloride: 94 mmol/L — ABNORMAL LOW (ref 101–111)
Creatinine, Ser: 10.61 mg/dL — ABNORMAL HIGH (ref 0.61–1.24)
GFR, EST AFRICAN AMERICAN: 5 mL/min — AB (ref >60)
GFR, EST NON AFRICAN AMERICAN: 5 mL/min — AB (ref >60)
Glucose, Bld: 115 mg/dL — ABNORMAL HIGH (ref 65–99)
PHOSPHORUS: 5.6 mg/dL — AB (ref 2.5–4.6)
POTASSIUM: 3.9 mmol/L (ref 3.5–5.1)
Sodium: 133 mmol/L — ABNORMAL LOW (ref 135–145)

## 2017-03-08 LAB — URINE CULTURE: Culture: NO GROWTH

## 2017-03-08 LAB — GLUCOSE, CAPILLARY
GLUCOSE-CAPILLARY: 255 mg/dL — AB (ref 65–99)
Glucose-Capillary: 92 mg/dL (ref 65–99)
Glucose-Capillary: 202 mg/dL — ABNORMAL HIGH (ref 65–99)
Glucose-Capillary: 110 mg/dL — ABNORMAL HIGH (ref 65–99)

## 2017-03-08 LAB — HEMOGLOBIN A1C
Hgb A1c MFr Bld: 13 % — ABNORMAL HIGH (ref 4.8–5.6)
Mean Plasma Glucose: 326 mg/dL

## 2017-03-08 MED ORDER — HEPARIN SODIUM (PORCINE) 1000 UNIT/ML DIALYSIS: 1000.0000 [IU] | INTRAMUSCULAR | Status: DC | PRN

## 2017-03-08 MED ORDER — CEFAZOLIN SODIUM-DEXTROSE 2-4 GM/100ML-% IV SOLN
2.0000 g | INTRAVENOUS | Status: DC
  Administered 2017-03-08: 2 g via INTRAVENOUS

## 2017-03-08 MED ORDER — HEPARIN SODIUM (PORCINE) 1000 UNIT/ML DIALYSIS
10000.0000 [IU] | Freq: Once | INTRAMUSCULAR | Status: DC
  Filled 2017-03-08: qty 10

## 2017-03-08 MED ORDER — CHLORPROMAZINE HCL 25 MG PO TABS
25.0000 mg | ORAL_TABLET | Freq: Three times a day (TID) | ORAL | Status: DC | PRN
  Administered 2017-03-08: 25 mg via ORAL
  Filled 2017-03-08 (×3): qty 1

## 2017-03-08 MED ORDER — SODIUM CHLORIDE 0.9 % IV SOLN: 100.0000 mL | INTRAVENOUS | Status: DC | PRN

## 2017-03-08 MED ORDER — ALLOPURINOL 100 MG PO TABS
100.0000 mg | ORAL_TABLET | Freq: Every day | ORAL | Status: DC
  Administered 2017-03-09 – 2017-03-14 (×6): 100 mg via ORAL
  Filled 2017-03-08 (×6): qty 1

## 2017-03-08 MED ORDER — INSULIN DETEMIR 100 UNIT/ML ~~LOC~~ SOLN
25.0000 [IU] | Freq: Two times a day (BID) | SUBCUTANEOUS | Status: DC
Start: 2017-03-08 — End: 2017-03-12
  Administered 2017-03-08 – 2017-03-12 (×8): 25 [IU] via SUBCUTANEOUS
  Filled 2017-03-08 (×9): qty 0.25

## 2017-03-08 MED ORDER — LIDOCAINE-PRILOCAINE 2.5-2.5 % EX CREA: 1.0000 "application " | TOPICAL_CREAM | CUTANEOUS | Status: DC | PRN

## 2017-03-08 MED ORDER — HYDROMORPHONE HCL 1 MG/ML IJ SOLN
INTRAMUSCULAR | Status: AC
  Filled 2017-03-08: qty 1

## 2017-03-08 MED ORDER — CEFAZOLIN SODIUM-DEXTROSE 2-4 GM/100ML-% IV SOLN
2.0000 g | Freq: Once | INTRAVENOUS | Status: AC
  Administered 2017-03-08: 2 g via INTRAVENOUS
  Filled 2017-03-08: qty 100

## 2017-03-08 MED ORDER — PENTAFLUOROPROP-TETRAFLUOROETH EX AERO: 1.0000 "application " | INHALATION_SPRAY | CUTANEOUS | Status: DC | PRN

## 2017-03-08 MED ORDER — ALTEPLASE 2 MG IJ SOLR: 2.0000 mg | Freq: Once | INTRAMUSCULAR | Status: DC | PRN

## 2017-03-08 MED ORDER — LIDOCAINE HCL (PF) 1 % IJ SOLN
5.0000 mL | INTRAMUSCULAR | Status: DC | PRN
  Filled 2017-03-08: qty 5

## 2017-03-08 NOTE — Care Management Important Message (Signed)
Important Message  Patient Details  Name: [name] MRN: [id number] Date of Birth: 11-Aug-1956   Medicare Important Message Given:  Yes    [name] 03/08/2017, 1:47 PM

## 2017-03-08 NOTE — Procedures (Addendum)
Patient seen on Hemodialysis. QB 400, UF goal 2.0L Treatment adjusted as needed.  Elmarie Shiley MD [facility]. Office # [phone] Pager # [phone] 1:35 PM

## 2017-03-08 NOTE — Progress Notes (Signed)
Schorr, NP notified; patients hiccups have been persistent since beginning of shift. Patient has not been able to rest despite RT adjusting CPAP settings for more comfort and RN administration of Ambien. New orders received for PRN thorazine.

## 2017-03-08 NOTE — Progress Notes (Signed)
PROGRESS NOTE    [name]  HYQ:[id number] DOB: 12-Nov-1956 DOA: 03/04/2017 PCP: Gara Kroner, MD   Brief Narrative:  [name] is a 61 y.o. male with medical history of ESRD on HD, MGUS, Gout, Hep C (recently treated), morbid obesity, sleep apnea who presents from dialysis for sudden shortness of breath. The patient keeps falling asleep and so the wife helps with history. His BP apparently kept dropping while in dialysis. He completed dialysis. 1 pound of fluid was removed to bring him back to his baseline weight. Due to severe dyspnea he was sent to the hospital. No cough, congestion, runny nose or sore thoat. No abdominal pain. No fever or chills.  When I am about to leave the room his wife tells me that she has been trying to get him to go to the doctor for 3 days now mainly for vomiting and diarrhea. They ate out at a restaurant on the day of the [place] (Sunday) and he has since had diarrhea and vomiting. Yesterday he felt exhausted and could not go to work. He vomited again last night.   He also states that his scrotum has been swollen for a few days now. It did not hurt until it was palpated in the ER.     Assessment & Plan:   Principal Problem:   Acute respiratory failure with hypoxia (HCC) Active Problems:   Hypertension   Hyperlipidemia   Gout   Obesity   Diabetes mellitus (HCC)   Monoclonal gammopathies   End stage renal disease (HCC)   Scrotal swelling   ESRD (end stage renal disease) on dialysis (HCC)   Scrotal pain   Sepsis (HCC)   Gram-neg septicemia (HCC)   Scrotal edema  #1 acute respiratory failure with hypoxia Questionable etiology. Patient is a morbidly obese gentleman who had presented with hypotension, elevated lactic acid level shortness of breath testicular pain and swelling. Concern for sepsis. Patient states some improvement with shortness of breath after hemodialysis. Influenza PCR is negative. Patient with some difficulty in keeping CPAP  ON THROUGHOUT THE NIGHT. ABG essentially normal. Patient with her hypoxia. Blood cultures 1/2 positive for Proteus species. Also concerning for epididymitis. CT angiogram chest negative for PE. Patient currently on empiric IV Rocephin. 2-D echo with normal EF 55-60% and grade 2 diastolic dysfunction . IV vancomycin has been discontinued. Patient has been seen in consultation by pulmonary. Patient has been changed to IV Ancef.Follow.  #2 probable Proteus bacteremia/Sepsis Blood cultures 1/2 positive for Proteus. Likely may have seeded from the urine. Patient noted to have fevers and hypotension on admission. CT abdomen and pelvis has been ordered per nephrology. Patient currently on IV Rocephin. MRSA PCR negative. IV vancomycin has been discontinued. Patient has a seen in consultation by ID and changed IV Rocephin to IV Ancef and recommended two-week course of IV antibiotics with hemodialysis.   #3 hypertension Holding antihypertensive medications.  #4 scrotal swelling/scrotal infection Patient has been seen in association by urology. Blood cultures growing Proteus. CT abdomen and pelvis has been ordered per nephrology. Continue empiric IV antibiotics. Urology feels dialysis will help with some of the scrotal edema. Elevate scrotum. Foley catheter ordered per urology.  #5 diabetes mellitus Hemoglobin A1c 13. CBGs have ranged from 92-220. Decrease meal coverage insulin to 15 units 3 times a day. Continue sliding scale insulin. Decrease Levemir to 25 units BID  #6 hyperlipidemia Continue statin.  #7 gout Continue allopurinol.  #8 morbid obesity  #9 obstructive sleep apnea Patient  has been advised that it's in his best interest to use CPAP at bedtime.CPAP QHS .  #10 hepatitis C Treated.  DVT prophylaxis: Heparin Code Status: Full Family Communication: Updated patient and family at bedside. Disposition Plan: Home once shortness of breath has improved, clinical improvement and per nephrology  and urology..   Consultants:   Urology: Dr. Diona Fanti 03/04/2017  Pulmonary: Dr. Melvyn Novas 03/05/2017  Nephrology: Dr.Schertz 03/04/2017  Infectious diseases: Dr. Johnnye Sima pending 03/07/2017  Procedures:   CT angiogram chest 03/04/2017  Chest x-ray 03/04/2017  Ultrasound of the scrotum 03/04/2017  2-D echo 03/07/2017   Antimicrobials:   IV vancomycin 03/04/2017>>>>>> 03/06/2017  IV Zosyn 03/04/2017>>>> 03/05/2017  IV Rocephin 03/05/2017>>>>> 03/07/2017  IV Ancef 03/07/2017   Subjective: Patient in hemodialysis. Patient states some improvement with shortness of breath. No chest pain. Patient noted to have low-grade fever of 100F today. Foley catheter placed  Objective: Vitals:   03/08/17 0441 03/08/17 1031 03/08/17 1248 03/08/17 1253  BP: (!) 122/59 (!) 114/59 (!) 126/54 (!) 108/51  Pulse: 86 82 80 81  Resp: (!) 22 (!) 26 20   Temp: 100 F (37.8 C) 98.6 F (37 C) 98.8 F (37.1 C)   TempSrc: Oral Oral Oral   SpO2: 92% 98% 98%   Weight:   (!) 159.8 kg (352 lb 4.7 oz)   Height:        Intake/Output Summary (Last 24 hours) at 03/08/17 1411 Last data filed at 03/08/17 1008  Gross per 24 hour  Intake             1180 ml  Output               25 ml  Net             1155 ml   Filed Weights   03/07/17 1223 03/07/17 2057 03/08/17 1248  Weight: (!) 157.9 kg (348 lb 1.7 oz) (!) 157.6 kg (347 lb 7.1 oz) (!) 159.8 kg (352 lb 4.7 oz)    Examination:  General exam: In HD  Respiratory system: Clear to auscultation anterior lung fields.. Decreased breath sounds in the bases. Respiratory effort normal. Cardiovascular system: S1 & S2 heard, RRR. No JVD, murmurs, rubs, gallops or clicks. No pedal edema. Gastrointestinal system: Abdomen is distended, soft and nontender. No organomegaly or masses felt. Normal bowel sounds heard. Central nervous system: Alert and oriented. No focal neurological deficits. Extremities: Symmetric 5 x 5 power. Genitourinary: Edematous scrotum  and penis. Skin: No rashes, lesions or ulcers Psychiatry: Judgement and insight appear normal. Mood & affect appropriate.     Data Reviewed: I have personally reviewed following labs and imaging studies  CBC:  Recent Labs Lab 03/04/17 0930 03/04/17 1457 03/06/17 0701 03/07/17 0555 03/08/17 0259  WBC 23.2* 19.1* 18.1* 14.2* 16.9*  NEUTROABS 18.7*  --  14.6*  --   --   HGB 11.2* 10.7* 9.7* 10.1* 9.7*  HCT 34.8* 33.8* 30.4* 31.3* 30.6*  MCV 78.2 77.3* 75.8* 75.1* 75.6*  PLT 140* 146* 126* 132* 322   Basic Metabolic Panel:  Recent Labs Lab 03/04/17 0930 03/04/17 1457 03/05/17 0900 03/06/17 0701 03/07/17 0555 03/08/17 0259  NA 132*  --  130* 128* 128* 133*  K 4.1  --  5.0 4.2 4.8 3.9  CL 89*  --  87* 89* 91* 94*  CO2 26  --  22 22 20* 24  GLUCOSE 322*  --  337* 275* 336* 115*  BUN 44*  --  56* 57* 76* 48*  CREATININE 11.68* 11.96* 14.60* 13.56* 15.04* 10.61*  CALCIUM 8.5*  --  9.3 9.0 8.6* 8.3*  PHOS  --   --  7.9* 7.7* 8.7* 5.6*   GFR: Estimated Creatinine Clearance: 11.7 mL/min (A) (by C-G formula based on SCr of 10.61 mg/dL (H)). Liver Function Tests:  Recent Labs Lab 03/04/17 0930 03/05/17 0900 03/06/17 0701 03/07/17 0555 03/08/17 0259  AST 49*  --   --   --   --   ALT 24  --   --   --   --   ALKPHOS 125  --   --   --   --   BILITOT 0.6  --   --   --   --   PROT 8.2*  --   --   --   --   ALBUMIN 2.9* 2.4* 2.1* 2.0* 1.9*   No results for input(s): LIPASE, AMYLASE in the last 168 hours. No results for input(s): AMMONIA in the last 168 hours. Coagulation Profile:  Recent Labs Lab 03/04/17 0930  INR 1.21   Cardiac Enzymes: No results for input(s): CKTOTAL, CKMB, CKMBINDEX, TROPONINI in the last 168 hours. BNP (last 3 results) No results for input(s): PROBNP in the last 8760 hours. HbA1C: No results for input(s): HGBA1C in the last 72 hours. CBG:  Recent Labs Lab 03/07/17 1345 03/07/17 1653 03/07/17 2054 03/08/17 0742 03/08/17 1145    GLUCAP 181* 220* 197* 92 202*   Lipid Profile: No results for input(s): CHOL, HDL, LDLCALC, TRIG, CHOLHDL, LDLDIRECT in the last 72 hours. Thyroid Function Tests: No results for input(s): TSH, T4TOTAL, FREET4, T3FREE, THYROIDAB in the last 72 hours. Anemia Panel: No results for input(s): VITAMINB12, FOLATE, FERRITIN, TIBC, IRON, RETICCTPCT in the last 72 hours. Sepsis Labs:  Recent Labs Lab 03/04/17 6734 03/04/17 1317 03/04/17 1700  LATICACIDVEN 3.30* 1.94* 1.5    Recent Results (from the past 240 hour(s))  Culture, blood (routine x 2)     Status: None (Preliminary result)   Collection Time: 03/04/17  9:27 AM  Result Value Ref Range Status   Specimen Description BLOOD LEFT ANTECUBITAL  Final   Special Requests   Final    BOTTLES DRAWN AEROBIC AND ANAEROBIC Blood Culture adequate volume   Culture   Final    NO GROWTH 4 DAYS Performed at [facility], [place] [address], [place], [place] [address]    Report Status PENDING  Incomplete  Culture, blood (routine x 2)     Status: Abnormal   Collection Time: 03/04/17 10:31 AM  Result Value Ref Range Status   Specimen Description BLOOD LEFT HAND  Final   Special Requests   Final    IN PEDIATRIC BOTTLE Blood Culture results may not be optimal due to an excessive volume of blood received in culture bottles   Culture  Setup Time   Final    GRAM NEGATIVE RODS IN PEDIATRIC BOTTLE CRITICAL RESULT CALLED TO, READ BACK BY AND VERIFIED WITH: R RUMBARGER 03/05/17 @ 0914 M VESTAL    Culture PROTEUS MIRABILIS (A)  Final   Report Status 03/07/2017 FINAL  Final   Organism ID, Bacteria PROTEUS MIRABILIS  Final      Susceptibility   Proteus mirabilis - MIC*    AMPICILLIN <=2 SENSITIVE Sensitive     CEFAZOLIN <=4 SENSITIVE Sensitive     CEFEPIME <=1 SENSITIVE Sensitive     CEFTAZIDIME <=1 SENSITIVE Sensitive     CEFTRIAXONE <=1 SENSITIVE Sensitive     CIPROFLOXACIN <=0.25  SENSITIVE Sensitive     GENTAMICIN <=1 SENSITIVE Sensitive      IMIPENEM 4 SENSITIVE Sensitive     TRIMETH/SULFA <=20 SENSITIVE Sensitive     AMPICILLIN/SULBACTAM <=2 SENSITIVE Sensitive     PIP/TAZO <=4 SENSITIVE Sensitive     * PROTEUS MIRABILIS  Blood Culture ID Panel (Reflexed)     Status: Abnormal   Collection Time: 03/04/17 10:31 AM  Result Value Ref Range Status   Enterococcus species NOT DETECTED NOT DETECTED Final   Listeria monocytogenes NOT DETECTED NOT DETECTED Final   Staphylococcus species NOT DETECTED NOT DETECTED Final   Staphylococcus aureus NOT DETECTED NOT DETECTED Final   Streptococcus species NOT DETECTED NOT DETECTED Final   Streptococcus agalactiae NOT DETECTED NOT DETECTED Final   Streptococcus pneumoniae NOT DETECTED NOT DETECTED Final   Streptococcus pyogenes NOT DETECTED NOT DETECTED Final   Acinetobacter baumannii NOT DETECTED NOT DETECTED Final   Enterobacteriaceae species DETECTED (A) NOT DETECTED Final    Comment: Enterobacteriaceae represent a large family of gram-negative bacteria, not a single organism. CRITICAL RESULT CALLED TO, READ BACK BY AND VERIFIED WITH: R RUMBARGER,PHARMD AT 0914 03/05/17 BY L BENFIELD    Enterobacter cloacae complex NOT DETECTED NOT DETECTED Final   Escherichia coli NOT DETECTED NOT DETECTED Final   Klebsiella oxytoca NOT DETECTED NOT DETECTED Final   Klebsiella pneumoniae NOT DETECTED NOT DETECTED Final   Proteus species DETECTED (A) NOT DETECTED Final    Comment: CRITICAL RESULT CALLED TO, READ BACK BY AND VERIFIED WITH: R RUMBARGER,PHARMD AT 0914 03/05/17 BY M VESTAL    Serratia marcescens NOT DETECTED NOT DETECTED Final   Carbapenem resistance NOT DETECTED NOT DETECTED Final   Haemophilus influenzae NOT DETECTED NOT DETECTED Final   Neisseria meningitidis NOT DETECTED NOT DETECTED Final   Pseudomonas aeruginosa NOT DETECTED NOT DETECTED Final   Candida albicans NOT DETECTED NOT DETECTED Final   Candida glabrata NOT DETECTED NOT DETECTED Final   Candida krusei NOT DETECTED NOT  DETECTED Final   Candida parapsilosis NOT DETECTED NOT DETECTED Final   Candida tropicalis NOT DETECTED NOT DETECTED Final    Comment: Performed at [facility], [place] [address], [place], [place] [address]  Respiratory Panel by PCR     Status: None   Collection Time: 03/04/17  2:56 PM  Result Value Ref Range Status   Adenovirus NOT DETECTED NOT DETECTED Final   Coronavirus 229E NOT DETECTED NOT DETECTED Final   Coronavirus HKU1 NOT DETECTED NOT DETECTED Final   Coronavirus NL63 NOT DETECTED NOT DETECTED Final   Coronavirus OC43 NOT DETECTED NOT DETECTED Final   Metapneumovirus NOT DETECTED NOT DETECTED Final   Rhinovirus / Enterovirus NOT DETECTED NOT DETECTED Final   Influenza A NOT DETECTED NOT DETECTED Final   Influenza B NOT DETECTED NOT DETECTED Final   Parainfluenza Virus 1 NOT DETECTED NOT DETECTED Final   Parainfluenza Virus 2 NOT DETECTED NOT DETECTED Final   Parainfluenza Virus 3 NOT DETECTED NOT DETECTED Final   Parainfluenza Virus 4 NOT DETECTED NOT DETECTED Final   Respiratory Syncytial Virus NOT DETECTED NOT DETECTED Final   Bordetella pertussis NOT DETECTED NOT DETECTED Final   Chlamydophila pneumoniae NOT DETECTED NOT DETECTED Final   Mycoplasma pneumoniae NOT DETECTED NOT DETECTED Final    Comment: Performed at [facility], [place] [address], [place],  [address]  MRSA PCR Screening     Status: None   Collection Time: 03/04/17  9:50 PM  Result Value Ref Range Status  MRSA by PCR NEGATIVE NEGATIVE Final    Comment:        The GeneXpert MRSA Assay (FDA approved for NASAL specimens only), is one component of a comprehensive MRSA colonization surveillance program. It is not intended to diagnose MRSA infection nor to guide or monitor treatment for MRSA infections.   Urine culture     Status: None   Collection Time: 03/07/17  1:10 PM  Result Value Ref Range Status   Specimen Description URINE, CATHETERIZED  Final   Special Requests NONE  Final    Culture NO GROWTH  Final   Report Status 03/08/2017 FINAL  Final         Radiology Studies: Ct Abdomen Pelvis W Contrast  Result Date: 03/06/2017 CLINICAL DATA:  Scrotal pain and swelling for about a week. Question abscess. EXAM: CT ABDOMEN AND PELVIS WITH CONTRAST TECHNIQUE: Multidetector CT imaging of the abdomen and pelvis was performed using the standard protocol following bolus administration of intravenous contrast. CONTRAST:  137mL ISOVUE-300 IOPAMIDOL (ISOVUE-300) INJECTION 61% COMPARISON:  Ultrasound exam from 03/04/2017. FINDINGS: Lower chest: Dependent atelectasis in the lower lobes. Hepatobiliary: The liver shows diffusely decreased attenuation suggesting steatosis. Gallbladder decompressed. No intrahepatic or extrahepatic biliary dilation. Pancreas: No focal mass lesion. No dilatation of the main duct. No intraparenchymal cyst. No peripancreatic edema. Spleen: No splenomegaly. No focal mass lesion. Adrenals/Urinary Tract: Bilateral adrenal thickening without nodule or mass. 14 mm hyperattenuating lesion is identified in the interpolar right kidney (image 45 series 9). No suspicious mass lesion in the left kidney. No evidence for hydroureter. Bladder wall appears thickened although bladder is decompressed. Stomach/Bowel: Stomach is nondistended. No gastric wall thickening. No evidence of outlet obstruction. Duodenum is normally positioned as is the ligament of Treitz. No small bowel wall thickening. No small bowel dilatation. The terminal ileum is normal. The appendix is not visualized, but there is no edema or inflammation in the region of the cecum. Diverticular changes are noted in the left colon without evidence of diverticulitis. Vascular/Lymphatic: There is abdominal aortic atherosclerosis without aneurysm. 13 mm short axis hepatoduodenal ligament lymph node is borderline to mildly enlarged. No retroperitoneal lymphadenopathy. No pelvic sidewall lymphadenopathy. Reproductive: Prostate  gland unremarkable. Marked skin thickening and subcutaneous edema noted in the penis and scrotum. No focal or rim enhancing fluid collection is identified to suggest abscess. No evidence for soft tissue gas in the tissues of the perineum. Other: No intraperitoneal free fluid. Musculoskeletal: Bone windows reveal no worrisome lytic or sclerotic osseous lesions. IMPRESSION: 1. Marked skin thickening and subcutaneous edema in the soft tissues of the penis and scrotum. No focal or rim enhancing fluid collection to suggest abscess. No gas in the scrotum or perineum to suggest Fournier's gangrene. 2. 14 mm hyperattenuating lesion interpolar right kidney could represent neoplasm. MRI without and with contrast recommended to further evaluate. 3. 13 mm short axis hepatoduodenal ligament lymph node upper normal to borderline increased but nonspecific. 4. Bilateral lower lobe atelectasis. 5.  Abdominal Aortic Atherosclerois (ICD10-170.0) 6. Fatty liver with asymmetric enlargement of left hepatic lobe. No contour nodularity to suggest overt cirrhosis. Electronically Signed   By: Misty Stanley M.D.   On: 03/06/2017 16:26   Dg Chest Port 1 View  Result Date: 03/07/2017 CLINICAL DATA:  Dialysis, shortness of breath EXAM: PORTABLE CHEST 1 VIEW COMPARISON:  CT chest 03/04/2017 FINDINGS: There is no focal parenchymal opacity. There is no pleural effusion or pneumothorax. There is stable cardiomegaly. The osseous structures are unremarkable. IMPRESSION: No active disease.  Electronically Signed   By: Kathreen Devoid   On: 03/07/2017 09:24        Scheduled Meds: . Derrill Memo ON 03/09/2017] allopurinol  100 mg Oral Daily  . aspirin EC  81 mg Oral Daily  . calcitRIOL  3.5 mcg Oral Q M,W,F-HD  . cinacalcet  60 mg Oral Q breakfast  . darbepoetin (ARANESP) injection - DIALYSIS  40 mcg Intravenous Q Mon-HD  . ferric citrate  630 mg Oral TID WC  . gabapentin  200 mg Oral BID  . heparin  10,000 Units Dialysis Once in dialysis  .  insulin aspart  0-9 Units Subcutaneous TID WC  . insulin aspart  20 Units Subcutaneous TID WC  . insulin detemir  30 Units Subcutaneous BID  . pravastatin  40 mg Oral QHS   Continuous Infusions: . sodium chloride    . sodium chloride    .  ceFAZolin (ANCEF) IV    . [START ON 03/09/2017]  ceFAZolin (ANCEF) IV       LOS: 4 days    Time spent: 59 minutes    THOMPSON,DANIEL, MD [facility] Pager [phone]  If 7PM-7AM, please contact night-coverage [email or web address] Password [email or web address] 03/08/2017, 2:11 PM

## 2017-03-08 NOTE — Progress Notes (Signed)
Patient not consistent with CPAP use throughout shift. Patient politely refused to put mask back on after 3am due to his complaint of it not working and it causing his mouth to be more dry than normal. Patient educated regarding importance of wearing mask versus risks of not wearing it and has verbalized understanding.

## 2017-03-08 NOTE — Progress Notes (Signed)
[facility] Progress Note   Subjective: Patient lying flat in bed, C/O dry mouth from wearing CPAP during night. No C/O SOB. C/O pain in penis after foley cath insertion yesterday.  NAD  Objective Vitals:   03/07/17 1347 03/07/17 1712 03/07/17 2057 03/08/17 0441  BP: (!) 146/71 (!) 112/45 111/71 (!) 122/59  Pulse: 83 87 82 86  Resp: (!) 21 20 19  (!) 22  Temp: 98.6 F (37 C) 98.5 F (36.9 C) 98.6 F (37 C) 100 F (37.8 C)  TempSrc: Oral Oral Oral Oral  SpO2: 99% 98% 91% 92%  Weight:   (!) 157.6 kg (347 lb 7.1 oz)   Height:       Physical Exam General: WNWD, NAD Heart: HS distant S1,S2,  Lungs: BBS, CTAB sl decreased in bases.  Abdomen: obese, active BS. Foley cath with gross hematuria present Extremities: 1+ bilateral pitting edema Dialysis Access: RUA AVF + bruit    Additional Objective Labs: Basic Metabolic Panel:  Recent Labs Lab 03/06/17 0701 03/07/17 0555 03/08/17 0259  NA 128* 128* 133*  K 4.2 4.8 3.9  CL 89* 91* 94*  CO2 22 20* 24  GLUCOSE 275* 336* 115*  BUN 57* 76* 48*  CREATININE 13.56* 15.04* 10.61*  CALCIUM 9.0 8.6* 8.3*  PHOS 7.7* 8.7* 5.6*   Liver Function Tests:  Recent Labs Lab 03/04/17 0930  03/06/17 0701 03/07/17 0555 03/08/17 0259  AST 49*  --   --   --   --   ALT 24  --   --   --   --   ALKPHOS 125  --   --   --   --   BILITOT 0.6  --   --   --   --   PROT 8.2*  --   --   --   --   ALBUMIN 2.9*  < > 2.1* 2.0* 1.9*  < > = values in this interval not displayed. No results for input(s): LIPASE, AMYLASE in the last 168 hours. CBC:  Recent Labs Lab 03/04/17 0930 03/04/17 1457 03/06/17 0701 03/07/17 0555 03/08/17 0259  WBC 23.2* 19.1* 18.1* 14.2* 16.9*  NEUTROABS 18.7*  --  14.6*  --   --   HGB 11.2* 10.7* 9.7* 10.1* 9.7*  HCT 34.8* 33.8* 30.4* 31.3* 30.6*  MCV 78.2 77.3* 75.8* 75.1* 75.6*  PLT 140* 146* 126* 132* 166   Blood Culture    Component Value Date/Time   SDES BLOOD LEFT HAND 03/04/2017 1031    SPECREQUEST  03/04/2017 1031    IN PEDIATRIC BOTTLE Blood Culture results may not be optimal due to an excessive volume of blood received in culture bottles   CULT PROTEUS MIRABILIS (A) 03/04/2017 1031   REPTSTATUS 03/07/2017 FINAL 03/04/2017 1031    Cardiac Enzymes: No results for input(s): CKTOTAL, CKMB, CKMBINDEX, TROPONINI in the last 168 hours. CBG:  Recent Labs Lab 03/06/17 2214 03/07/17 1345 03/07/17 1653 03/07/17 2054 03/08/17 0742  GLUCAP 350* 181* 220* 197* 92   Iron Studies: No results for input(s): IRON, TIBC, TRANSFERRIN, FERRITIN in the last 72 hours. @lablastinr3 @ Studies/Results: Ct Abdomen Pelvis W Contrast  Result Date: 03/06/2017 CLINICAL DATA:  Scrotal pain and swelling for about a week. Question abscess. EXAM: CT ABDOMEN AND PELVIS WITH CONTRAST TECHNIQUE: Multidetector CT imaging of the abdomen and pelvis was performed using the standard protocol following bolus administration of intravenous contrast. CONTRAST:  127mL ISOVUE-300 IOPAMIDOL (ISOVUE-300) INJECTION 61% COMPARISON:  Ultrasound exam from 03/04/2017. FINDINGS: Lower chest:  Dependent atelectasis in the lower lobes. Hepatobiliary: The liver shows diffusely decreased attenuation suggesting steatosis. Gallbladder decompressed. No intrahepatic or extrahepatic biliary dilation. Pancreas: No focal mass lesion. No dilatation of the main duct. No intraparenchymal cyst. No peripancreatic edema. Spleen: No splenomegaly. No focal mass lesion. Adrenals/Urinary Tract: Bilateral adrenal thickening without nodule or mass. 14 mm hyperattenuating lesion is identified in the interpolar right kidney (image 45 series 9). No suspicious mass lesion in the left kidney. No evidence for hydroureter. Bladder wall appears thickened although bladder is decompressed. Stomach/Bowel: Stomach is nondistended. No gastric wall thickening. No evidence of outlet obstruction. Duodenum is normally positioned as is the ligament of Treitz. No small  bowel wall thickening. No small bowel dilatation. The terminal ileum is normal. The appendix is not visualized, but there is no edema or inflammation in the region of the cecum. Diverticular changes are noted in the left colon without evidence of diverticulitis. Vascular/Lymphatic: There is abdominal aortic atherosclerosis without aneurysm. 13 mm short axis hepatoduodenal ligament lymph node is borderline to mildly enlarged. No retroperitoneal lymphadenopathy. No pelvic sidewall lymphadenopathy. Reproductive: Prostate gland unremarkable. Marked skin thickening and subcutaneous edema noted in the penis and scrotum. No focal or rim enhancing fluid collection is identified to suggest abscess. No evidence for soft tissue gas in the tissues of the perineum. Other: No intraperitoneal free fluid. Musculoskeletal: Bone windows reveal no worrisome lytic or sclerotic osseous lesions. IMPRESSION: 1. Marked skin thickening and subcutaneous edema in the soft tissues of the penis and scrotum. No focal or rim enhancing fluid collection to suggest abscess. No gas in the scrotum or perineum to suggest Fournier's gangrene. 2. 14 mm hyperattenuating lesion interpolar right kidney could represent neoplasm. MRI without and with contrast recommended to further evaluate. 3. 13 mm short axis hepatoduodenal ligament lymph node upper normal to borderline increased but nonspecific. 4. Bilateral lower lobe atelectasis. 5.  Abdominal Aortic Atherosclerois (ICD10-170.0) 6. Fatty liver with asymmetric enlargement of left hepatic lobe. No contour nodularity to suggest overt cirrhosis. Electronically Signed   By: Misty Stanley M.D.   On: 03/06/2017 16:26   Dg Chest Port 1 View  Result Date: 03/07/2017 CLINICAL DATA:  Dialysis, shortness of breath EXAM: PORTABLE CHEST 1 VIEW COMPARISON:  CT chest 03/04/2017 FINDINGS: There is no focal parenchymal opacity. There is no pleural effusion or pneumothorax. There is stable cardiomegaly. The osseous  structures are unremarkable. IMPRESSION: No active disease. Electronically Signed   By: Kathreen Devoid   On: 03/07/2017 09:24   Medications: . [START ON 03/09/2017]  ceFAZolin (ANCEF) IV    . [START ON 03/09/2017] ferric gluconate (FERRLECIT/NULECIT) IV     . allopurinol  100 mg Oral BID  . aspirin EC  81 mg Oral Daily  . calcitRIOL  3.5 mcg Oral Q M,W,F-HD  . cinacalcet  60 mg Oral Q breakfast  . darbepoetin (ARANESP) injection - DIALYSIS  40 mcg Intravenous Q Mon-HD  . ferric citrate  630 mg Oral TID WC  . gabapentin  200 mg Oral BID  . insulin aspart  0-9 Units Subcutaneous TID WC  . insulin aspart  20 Units Subcutaneous TID WC  . insulin detemir  30 Units Subcutaneous BID  . pravastatin  40 mg Oral QHS   HD Orders: [facility] MWF 4 hour 45 minutes 156 kg 200 NRe 550/800 2.0 K/ 2.0 Ca  Heparin 10,000 units IV TIW beginning of HD Heparin 2000 unit IV Mid run TIW Calcitriol 3.5 mcg PO TIW  Venofer 50 mg  IV weekly  Assessment/Plan: 1. Scrotal and Penile Pain/Swelling: ID/Urology following. Now with foley catheter-has hematuria-most likely D/T traumatic insertion. BC +Proteus mirabilis. Has been started on Ancef.  2. Volume Overload: Still with BLE pitting edema. HD 03/07/17 Remains 1.9 above EDW. Serial HD for volume removal. Attempt to get to EDW today and continue lowering volume as tolerated. Believe this will assist with scrotal swelling.  2. ESRD -MWF HD today off schedule. K+ 3.9 3. Anemia - HGB 9.7. No OP ESA-follow HGB.  4. Secondary hyperparathyroidism - Cont binders, VDRA, Sensipar. Phos 5.6 Ca 8.3 C Ca 9.98 5. HTN/volume - BP controlled. Volume as noted above 6. Nutrition - Albumin 1.9 severe protein calorie malnutrition in setting of infection/morbid obesity. Add prostat, renal vits. Renal/Carb Mod diet 7. OSA: Non-compliant with CPAP during night. Advised to wear as directed.  8. DM: per primary  Tieshia Rettinger H. Sylvanna Burggraf NP-C 03/08/2017, 9:06 AM  [employer] [phone]

## 2017-03-08 NOTE — Progress Notes (Signed)
Pharmacy Antibiotic Note  [name] is a 61 y.o. male admitted on 03/04/2017 with sepsis and a possible scrotal infection.  Pharmacy has been consulted for cefazolin dosing.  ID recommends 2 weeks of Ancef with HD. Patient to get an extra HD session today to help with scrotal swelling.  Plan: Cefazolin 2g IV x 1 tonight due to extra HD session Cefazolin 2g IV QHD-MWF Monitor clinical picture, renal function   Height: 6' (182.9 cm) Weight: (!) 347 lb 7.1 oz (157.6 kg) IBW/kg (Calculated) : 77.6  Temp (24hrs), Avg:98.6 F (37 C), Min:97.6 F (36.4 C), Max:100 F (37.8 C)   Recent Labs Lab 03/04/17 0930 03/04/17 0953 03/04/17 1317 03/04/17 1457 03/04/17 1700 03/05/17 0900 03/06/17 0701 03/07/17 0555 03/08/17 0259  WBC 23.2*  --   --  19.1*  --   --  18.1* 14.2* 16.9*  CREATININE 11.68*  --   --  11.96*  --  14.60* 13.56* 15.04* 10.61*  LATICACIDVEN  --  3.30* 1.94*  --  1.5  --   --   --   --   VANCORANDOM  --   --   --   --   --   --  37  --   --     Estimated Creatinine Clearance: 11.6 mL/min (A) (by C-G formula based on SCr of 10.61 mg/dL (H)).    Allergies  Allergen Reactions  . Penicillins Nausea And Vomiting and Other (See Comments)    Has patient had a PCN reaction causing immediate rash, facial/tongue/throat swelling, SOB or lightheadedness with hypotension: No Has patient had a PCN reaction causing severe rash involving mucus membranes or skin necrosis: No Has patient had a PCN reaction that required hospitalization No Has patient had a PCN reaction occurring within the last 10 years: No If all of the above answers are "NO", then may proceed with Cephalosporin use.  . Vioxx [Rofecoxib] Nausea And Vomiting   Antimicrobials this admission:  4/20 vancomycin>>4/22 4/20 Zosyn>>4/21 4/21 CTX >> 4/23 4/23 Ancef >>  Microbiology results:  4/20BCx: 1/2 proteus- pan sensitive 4/20 BCID: proteus species  4/20MRSA PCR: negative  4/20 resp panel:  negative 4/20 MRSA PCR: negative   Brytney Somes D. Hanne Kegg, PharmD, BCPS Clinical Pharmacist Pager: [phone] 03/08/2017 11:17 AM

## 2017-03-09 DIAGNOSIS — E0829 Diabetes mellitus due to underlying condition with other diabetic kidney complication: Secondary | ICD-10-CM

## 2017-03-09 LAB — CBC WITH DIFFERENTIAL/PLATELET
Basophils Absolute: 0.2 10*3/uL — ABNORMAL HIGH (ref 0.0–0.1)
Basophils Relative: 1 %
EOS PCT: 1 %
Eosinophils Absolute: 0.2 10*3/uL (ref 0.0–0.7)
HEMATOCRIT: 31.6 % — AB (ref 39.0–52.0)
HEMOGLOBIN: 10.1 g/dL — AB (ref 13.0–17.0)
Lymphocytes Relative: 14 %
Lymphs Abs: 2.8 10*3/uL (ref 0.7–4.0)
MCH: 24.3 pg — ABNORMAL LOW (ref 26.0–34.0)
MCHC: 32 g/dL (ref 30.0–36.0)
MCV: 76 fL — ABNORMAL LOW (ref 78.0–100.0)
MONOS PCT: 9 %
Monocytes Absolute: 1.8 10*3/uL — ABNORMAL HIGH (ref 0.1–1.0)
NEUTROS ABS: 14.9 10*3/uL — AB (ref 1.7–7.7)
Neutrophils Relative %: 75 %
Platelets: 191 10*3/uL (ref 150–400)
RBC: 4.16 MIL/uL — AB (ref 4.22–5.81)
RDW: 16.3 % — ABNORMAL HIGH (ref 11.5–15.5)
WBC: 19.9 10*3/uL — AB (ref 4.0–10.5)

## 2017-03-09 LAB — RENAL FUNCTION PANEL
Albumin: 2 g/dL — ABNORMAL LOW (ref 3.5–5.0)
Anion gap: 15 (ref 5–15)
BUN: 41 mg/dL — AB (ref 6–20)
CHLORIDE: 92 mmol/L — AB (ref 101–111)
CO2: 23 mmol/L (ref 22–32)
Calcium: 9.1 mg/dL (ref 8.9–10.3)
Creatinine, Ser: 10.36 mg/dL — ABNORMAL HIGH (ref 0.61–1.24)
GFR calc Af Amer: 6 mL/min — ABNORMAL LOW (ref >60)
GFR calc non Af Amer: 5 mL/min — ABNORMAL LOW (ref >60)
GLUCOSE: 220 mg/dL — AB (ref 65–99)
Phosphorus: 5.2 mg/dL — ABNORMAL HIGH (ref 2.5–4.6)
Potassium: 3.4 mmol/L — ABNORMAL LOW (ref 3.5–5.1)
Sodium: 130 mmol/L — ABNORMAL LOW (ref 135–145)

## 2017-03-09 LAB — GLUCOSE, CAPILLARY
GLUCOSE-CAPILLARY: 134 mg/dL — AB (ref 65–99)
Glucose-Capillary: 244 mg/dL — ABNORMAL HIGH (ref 65–99)
Glucose-Capillary: 120 mg/dL — ABNORMAL HIGH (ref 65–99)

## 2017-03-09 LAB — CULTURE, BLOOD (ROUTINE X 2)
Culture: NO GROWTH
SPECIAL REQUESTS: ADEQUATE

## 2017-03-09 MED ORDER — LIDOCAINE-EPINEPHRINE 1 %-1:100000 IJ SOLN
5.0000 mL | Freq: Once | INTRAMUSCULAR | Status: DC
  Filled 2017-03-09: qty 5

## 2017-03-09 MED ORDER — VANCOMYCIN HCL IN DEXTROSE 1-5 GM/200ML-% IV SOLN: 1000.0000 mg | INTRAVENOUS | Status: DC

## 2017-03-09 MED ORDER — INSULIN ASPART 100 UNIT/ML ~~LOC~~ SOLN
15.0000 [IU] | Freq: Three times a day (TID) | SUBCUTANEOUS | Status: DC
  Administered 2017-03-10 – 2017-03-12 (×4): 15 [IU] via SUBCUTANEOUS

## 2017-03-09 MED ORDER — VANCOMYCIN HCL 10 G IV SOLR
1500.0000 mg | INTRAVENOUS | Status: DC
  Filled 2017-03-09: qty 1500

## 2017-03-09 MED ORDER — VANCOMYCIN HCL 10 G IV SOLR
1500.0000 mg | Freq: Once | INTRAVENOUS | Status: AC
  Administered 2017-03-09: 1500 mg via INTRAVENOUS
  Filled 2017-03-09 (×2): qty 1500

## 2017-03-09 NOTE — Progress Notes (Signed)
Foley catheter removed per MD order.  Patient tolerated well.  

## 2017-03-09 NOTE — Progress Notes (Signed)
Pt AVF arterial cannulated with 15needle without issue. Venous needle no blood return. Charge nurse [place] cannulated again which is not working well.Dr Elmarie Shiley notified. Order received as follow: send pt back to his room, use same order to do hemodialysis tomorrow.Report given to Cassell Smiles RN.

## 2017-03-09 NOTE — Progress Notes (Signed)
Subjective: Patient reports scrotal pain a bit less  Objective: Vital signs in last 24 hours: Temp:  [98 F (36.7 C)-100.8 F (38.2 C)] 100.8 F (38.2 C) (04/25 0522) Pulse Rate:  [72-92] 92 (04/25 0522) Resp:  [16-26] 24 (04/25 0522) BP: (108-170)/(51-110) 118/58 (04/25 0522) SpO2:  [92 %-99 %] 92 % (04/25 0522) Weight:  [149.6 kg (329 lb 12.9 oz)-159.8 kg (352 lb 4.7 oz)] 149.6 kg (329 lb 12.9 oz) (04/25 0520)  Intake/Output from previous day: 04/24 0701 - 04/25 0700 In: 650 [P.O.:480; IV Piggyback:100] Out: 1700 [Urine:200] Intake/Output this shift: No intake/output data recorded.  Physical Exam:  Constitutional: Vital signs reviewed. WD WN in NAD   Eyes: PERRL, No scleral icterus.   Pulmonary/Chest: Normal effort Abdominal: Soft. Non-tender, non-distended, bowel sounds are normal, no masses, organomegaly, or guarding present.  Genitourinary: scrotal skin desquamating--skin appears viable w/o crepitus.                        Posterior skin necrotic now over fluctuant 2 cm area.  Extremities: No cyanosis or edema   Lab Results:  Recent Labs  03/07/17 0555 03/08/17 0259  HGB 10.1* 9.7*  HCT 31.3* 30.6*   BMET  Recent Labs  03/07/17 0555 03/08/17 0259  NA 128* 133*  K 4.8 3.9  CL 91* 94*  CO2 20* 24  GLUCOSE 336* 115*  BUN 76* 48*  CREATININE 15.04* 10.61*  CALCIUM 8.6* 8.3*   No results for input(s): LABPT, INR in the last 72 hours. No results for input(s): LABURIN in the last 72 hours. Results for orders placed or performed during the hospital encounter of 03/04/17  Culture, blood (routine x 2)     Status: None (Preliminary result)   Collection Time: 03/04/17  9:27 AM  Result Value Ref Range Status   Specimen Description BLOOD LEFT ANTECUBITAL  Final   Special Requests   Final    BOTTLES DRAWN AEROBIC AND ANAEROBIC Blood Culture adequate volume   Culture   Final    NO GROWTH 4 DAYS Performed at [facility], [place] [address], [place],  [place] [address]    Report Status PENDING  Incomplete  Culture, blood (routine x 2)     Status: Abnormal   Collection Time: 03/04/17 10:31 AM  Result Value Ref Range Status   Specimen Description BLOOD LEFT HAND  Final   Special Requests   Final    IN PEDIATRIC BOTTLE Blood Culture results may not be optimal due to an excessive volume of blood received in culture bottles   Culture  Setup Time   Final    GRAM NEGATIVE RODS IN PEDIATRIC BOTTLE CRITICAL RESULT CALLED TO, READ BACK BY AND VERIFIED WITH: R RUMBARGER 03/05/17 @ 0914 M VESTAL    Culture PROTEUS MIRABILIS (A)  Final   Report Status 03/07/2017 FINAL  Final   Organism ID, Bacteria PROTEUS MIRABILIS  Final      Susceptibility   Proteus mirabilis - MIC*    AMPICILLIN <=2 SENSITIVE Sensitive     CEFAZOLIN <=4 SENSITIVE Sensitive     CEFEPIME <=1 SENSITIVE Sensitive     CEFTAZIDIME <=1 SENSITIVE Sensitive     CEFTRIAXONE <=1 SENSITIVE Sensitive     CIPROFLOXACIN <=0.25 SENSITIVE Sensitive     GENTAMICIN <=1 SENSITIVE Sensitive     IMIPENEM 4 SENSITIVE Sensitive     TRIMETH/SULFA <=20 SENSITIVE Sensitive     AMPICILLIN/SULBACTAM <=2 SENSITIVE Sensitive     PIP/TAZO <=  4 SENSITIVE Sensitive     * PROTEUS MIRABILIS  Blood Culture ID Panel (Reflexed)     Status: Abnormal   Collection Time: 03/04/17 10:31 AM  Result Value Ref Range Status   Enterococcus species NOT DETECTED NOT DETECTED Final   Listeria monocytogenes NOT DETECTED NOT DETECTED Final   Staphylococcus species NOT DETECTED NOT DETECTED Final   Staphylococcus aureus NOT DETECTED NOT DETECTED Final   Streptococcus species NOT DETECTED NOT DETECTED Final   Streptococcus agalactiae NOT DETECTED NOT DETECTED Final   Streptococcus pneumoniae NOT DETECTED NOT DETECTED Final   Streptococcus pyogenes NOT DETECTED NOT DETECTED Final   Acinetobacter baumannii NOT DETECTED NOT DETECTED Final   Enterobacteriaceae species DETECTED (A) NOT DETECTED Final    Comment: Enterobacteriaceae  represent a large family of gram-negative bacteria, not a single organism. CRITICAL RESULT CALLED TO, READ BACK BY AND VERIFIED WITH: R RUMBARGER,PHARMD AT 0914 03/05/17 BY L BENFIELD    Enterobacter cloacae complex NOT DETECTED NOT DETECTED Final   Escherichia coli NOT DETECTED NOT DETECTED Final   Klebsiella oxytoca NOT DETECTED NOT DETECTED Final   Klebsiella pneumoniae NOT DETECTED NOT DETECTED Final   Proteus species DETECTED (A) NOT DETECTED Final    Comment: CRITICAL RESULT CALLED TO, READ BACK BY AND VERIFIED WITH: R RUMBARGER,PHARMD AT 0914 03/05/17 BY M VESTAL    Serratia marcescens NOT DETECTED NOT DETECTED Final   Carbapenem resistance NOT DETECTED NOT DETECTED Final   Haemophilus influenzae NOT DETECTED NOT DETECTED Final   Neisseria meningitidis NOT DETECTED NOT DETECTED Final   Pseudomonas aeruginosa NOT DETECTED NOT DETECTED Final   Candida albicans NOT DETECTED NOT DETECTED Final   Candida glabrata NOT DETECTED NOT DETECTED Final   Candida krusei NOT DETECTED NOT DETECTED Final   Candida parapsilosis NOT DETECTED NOT DETECTED Final   Candida tropicalis NOT DETECTED NOT DETECTED Final    Comment: Performed at [facility], [place] [address], [place], [place] [address]  Respiratory Panel by PCR     Status: None   Collection Time: 03/04/17  2:56 PM  Result Value Ref Range Status   Adenovirus NOT DETECTED NOT DETECTED Final   Coronavirus 229E NOT DETECTED NOT DETECTED Final   Coronavirus HKU1 NOT DETECTED NOT DETECTED Final   Coronavirus NL63 NOT DETECTED NOT DETECTED Final   Coronavirus OC43 NOT DETECTED NOT DETECTED Final   Metapneumovirus NOT DETECTED NOT DETECTED Final   Rhinovirus / Enterovirus NOT DETECTED NOT DETECTED Final   Influenza A NOT DETECTED NOT DETECTED Final   Influenza B NOT DETECTED NOT DETECTED Final   Parainfluenza Virus 1 NOT DETECTED NOT DETECTED Final   Parainfluenza Virus 2 NOT DETECTED NOT DETECTED Final   Parainfluenza Virus 3 NOT  DETECTED NOT DETECTED Final   Parainfluenza Virus 4 NOT DETECTED NOT DETECTED Final   Respiratory Syncytial Virus NOT DETECTED NOT DETECTED Final   Bordetella pertussis NOT DETECTED NOT DETECTED Final   Chlamydophila pneumoniae NOT DETECTED NOT DETECTED Final   Mycoplasma pneumoniae NOT DETECTED NOT DETECTED Final    Comment: Performed at [facility], [place] [address] [place], [place] [address]  MRSA PCR Screening     Status: None   Collection Time: 03/04/17  9:50 PM  Result Value Ref Range Status   MRSA by PCR NEGATIVE NEGATIVE Final    Comment:        The GeneXpert MRSA Assay (FDA approved for NASAL specimens only), is one component of a comprehensive MRSA colonization surveillance program.  It is not intended to diagnose MRSA infection nor to guide or monitor treatment for MRSA infections.   Urine culture     Status: None   Collection Time: 03/07/17  1:10 PM  Result Value Ref Range Status   Specimen Description URINE, CATHETERIZED  Final   Special Requests NONE  Final   Culture NO GROWTH  Final   Report Status 03/08/2017 FINAL  Final   Urine culture negative  Studies/Results: Dg Chest Port 1 View  Result Date: 03/07/2017 CLINICAL DATA:  Dialysis, shortness of breath EXAM: PORTABLE CHEST 1 VIEW COMPARISON:  CT chest 03/04/2017 FINDINGS: There is no focal parenchymal opacity. There is no pleural effusion or pneumothorax. There is stable cardiomegaly. The osseous structures are unremarkable. IMPRESSION: No active disease. Electronically Signed   By: Kathreen Devoid   On: 03/07/2017 09:24    Assessment/Plan:   Scrotal process--fluctuant area now present--I'll I&D later today. Is MRSA covered by current abx rx?  Urine culture neg--will d/c foley.   LOS: 5 days   Franchot Gallo M 03/09/2017, 7:30 AM

## 2017-03-09 NOTE — Progress Notes (Signed)
PROGRESS NOTE    [name]  UEA:[id number] DOB: [birth year] DOA: 03/04/2017 PCP: Gara Kroner, MD   Brief Narrative:  [name] is a 61 y.o. male with medical history of ESRD on HD, MGUS, Gout, Hep C (recently treated), morbid obesity, sleep apnea who presents from dialysis for sudden shortness of breath. The patient keeps falling asleep and so the wife helps with history. His BP apparently kept dropping while in dialysis. He completed dialysis. 1 pound of fluid was removed to bring him back to his baseline weight. Due to severe dyspnea he was sent to the hospital. No cough, congestion, runny nose or sore thoat. No abdominal pain. No fever or chills.  When I am about to leave the room his wife tells me that she has been trying to get him to go to the doctor for 3 days now mainly for vomiting and diarrhea. They ate out at a restaurant on the day of the [place] (Sunday) and he has since had diarrhea and vomiting. Yesterday he felt exhausted and could not go to work. He vomited again last night.   He also states that his scrotum has been swollen for a few days now. It did not hurt until it was palpated in the ER.   Patient noted to have a Proteus bacteremia likely seeded from the urine. Patient empirically on IV antibiotics. ID following. Urology also fallen a urology for a I and D to be done on scrotum 03/09/2017.    Assessment & Plan:   Principal Problem:   Acute respiratory failure with hypoxia (HCC) Active Problems:   Hypertension   Hyperlipidemia   Gout   Obesity   Diabetes mellitus (HCC)   Monoclonal gammopathies   End stage renal disease (HCC)   Scrotal swelling   ESRD (end stage renal disease) on dialysis (HCC)   Scrotal pain   Sepsis (HCC)   Gram-neg septicemia (HCC)   Scrotal edema  #1 acute respiratory failure with hypoxia Questionable etiology. Patient is a morbidly obese gentleman who had presented with hypotension, elevated lactic acid level shortness of  breath testicular pain and swelling. Concern for sepsis. Patient states some improvement with shortness of breath after hemodialysis. Influenza PCR is negative. Patient with some difficulty in keeping CPAP ON THROUGHOUT THE NIGHT. ABG essentially normal. Patient with her hypoxia. Blood cultures 1/2 positive for Proteus species. Also concerning for epididymitis. CT angiogram chest negative for PE. Patient currently on empiric IV Rocephin. 2-D echo with normal EF 55-60% and grade 2 diastolic dysfunction . IV vancomycin has been discontinued. Patient has been seen in consultation by pulmonary. Patient has been changed to IV Ancef.Follow.  #2 probable Proteus bacteremia/Sepsis Blood cultures 1/2 positive for Proteus. Likely may have seeded from the urine. Patient noted to have fevers and hypotension on admission. CT abdomen and pelvis has been ordered per nephrology. Patient currently on IV Rocephin. MRSA PCR negative. IV vancomycin has been discontinued. Patient has a seen in consultation by ID and changed IV Rocephin to IV Ancef and recommended two-week course of IV antibiotics with hemodialysis.   #3 hypertension Holding antihypertensive medications.  #4 scrotal swelling/scrotal infection Patient has been seen in association by urology. Blood cultures growing Proteus. CT abdomen and pelvis has been ordered per nephrology. Continue empiric IV antibiotics. Urology feels dialysis will help with some of the scrotal edema. Scrotum posterior skin now with some fluctuance. Patient for  I and D per urology later on today 03/09/2017. Foley cath has been discontinued  per urology. Elevate scrotum.   #5 diabetes mellitus Hemoglobin A1c 13. CBGs have ranged from 110-244. Decrease meal coverage insulin to 15 units 3 times a day. Continue sliding scale insulin. Decreased Levemir to 25 units BID  #6 hyperlipidemia Continue statin.  #7 gout Continue allopurinol.  #8 morbid obesity  #9 obstructive sleep  apnea Patient has been advised that it's in his best interest to use CPAP at bedtime.CPAP QHS .  #10 hepatitis C Treated.  DVT prophylaxis: Heparin Code Status: Full Family Communication: Updated patient. No family at bedside. Disposition Plan: Home once shortness of breath has improved, clinical improvement and per nephrology and urology..   Consultants:   Urology: Dr. Diona Fanti 03/04/2017  Pulmonary: Dr. Melvyn Novas 03/05/2017  Nephrology: Dr.Schertz 03/04/2017  Infectious diseases: Dr. Johnnye Sima 03/07/2017  Procedures:   CT angiogram chest 03/04/2017  Chest x-ray 03/04/2017  Ultrasound of the scrotum 03/04/2017  2-D echo 03/07/2017   I and D scrotum per urology pending 03/09/2017  Antimicrobials:   IV vancomycin 03/04/2017>>>>>> 03/06/2017  IV Zosyn 03/04/2017>>>> 03/05/2017  IV Rocephin 03/05/2017>>>>> 03/07/2017  IV Ancef 03/07/2017   Subjective: Patient in bed. Patient denies any shortness of breath. Patient denies any chest pain. Foley catheter removed. MAXIMUM TEMPERATURE 100.8.   Objective: Vitals:   03/09/17 0231 03/09/17 0520 03/09/17 0522 03/09/17 0700  BP:   (!) 118/58 106/62  Pulse:   92 89  Resp:   (!) 24 (!) 24  Temp:   (!) 100.8 F (38.2 C) 99.1 F (37.3 C)  TempSrc:    Oral  SpO2:   92% 94%  Weight: (!) 153 kg (337 lb 4.9 oz) (!) 149.6 kg (329 lb 12.9 oz)    Height:        Intake/Output Summary (Last 24 hours) at 03/09/17 0953 Last data filed at 03/09/17 0601  Gross per 24 hour  Intake              650 ml  Output             1700 ml  Net            -1050 ml   Filed Weights   03/08/17 2017 03/09/17 0231 03/09/17 0520  Weight: (!) 153 kg (337 lb 4.9 oz) (!) 153 kg (337 lb 4.9 oz) (!) 149.6 kg (329 lb 12.9 oz)    Examination:  General exam: NAD  Respiratory system: Clear to auscultation anterior lung fields.. Decreased breath sounds in the bases. Respiratory effort normal. Cardiovascular system: S1 & S2 heard, RRR. No JVD, murmurs,  rubs, gallops or clicks. No pedal edema. Gastrointestinal system: Abdomen is distended, soft and nontender. No organomegaly or masses felt. Normal bowel sounds heard. Central nervous system: Alert and oriented. No focal neurological deficits. Extremities: Symmetric 5 x 5 power. Genitourinary: Edematous scrotum and penis.Scrotal skin with some disquamination with some posterior necrotic skin over a fluctuant area. Skin: No rashes, lesions or ulcers Psychiatry: Judgement and insight appear normal. Mood & affect appropriate.     Data Reviewed: I have personally reviewed following labs and imaging studies  CBC:  Recent Labs Lab 03/04/17 0930 03/04/17 1457 03/06/17 0701 03/07/17 0555 03/08/17 0259 03/09/17 0717  WBC 23.2* 19.1* 18.1* 14.2* 16.9* 19.9*  NEUTROABS 18.7*  --  14.6*  --   --  PENDING  HGB 11.2* 10.7* 9.7* 10.1* 9.7* 10.1*  HCT 34.8* 33.8* 30.4* 31.3* 30.6* 31.6*  MCV 78.2 77.3* 75.8* 75.1* 75.6* 76.0*  PLT 140* 146* 126* 132* 166 191  Basic Metabolic Panel:  Recent Labs Lab 03/05/17 0900 03/06/17 0701 03/07/17 0555 03/08/17 0259 03/09/17 0717  NA 130* 128* 128* 133* 130*  K 5.0 4.2 4.8 3.9 3.4*  CL 87* 89* 91* 94* 92*  CO2 22 22 20* 24 23  GLUCOSE 337* 275* 336* 115* 220*  BUN 56* 57* 76* 48* 41*  CREATININE 14.60* 13.56* 15.04* 10.61* 10.36*  CALCIUM 9.3 9.0 8.6* 8.3* 9.1  PHOS 7.9* 7.7* 8.7* 5.6* 5.2*   GFR: Estimated Creatinine Clearance: 11.6 mL/min (A) (by C-G formula based on SCr of 10.36 mg/dL (H)). Liver Function Tests:  Recent Labs Lab 03/04/17 0930 03/05/17 0900 03/06/17 0701 03/07/17 0555 03/08/17 0259 03/09/17 0717  AST 49*  --   --   --   --   --   ALT 24  --   --   --   --   --   ALKPHOS 125  --   --   --   --   --   BILITOT 0.6  --   --   --   --   --   PROT 8.2*  --   --   --   --   --   ALBUMIN 2.9* 2.4* 2.1* 2.0* 1.9* 2.0*   No results for input(s): LIPASE, AMYLASE in the last 168 hours. No results for input(s): AMMONIA in  the last 168 hours. Coagulation Profile:  Recent Labs Lab 03/04/17 0930  INR 1.21   Cardiac Enzymes: No results for input(s): CKTOTAL, CKMB, CKMBINDEX, TROPONINI in the last 168 hours. BNP (last 3 results) No results for input(s): PROBNP in the last 8760 hours. HbA1C: No results for input(s): HGBA1C in the last 72 hours. CBG:  Recent Labs Lab 03/08/17 0742 03/08/17 1145 03/08/17 1658 03/08/17 2212 03/09/17 0803  GLUCAP 92 202* 110* 255* 244*   Lipid Profile: No results for input(s): CHOL, HDL, LDLCALC, TRIG, CHOLHDL, LDLDIRECT in the last 72 hours. Thyroid Function Tests: No results for input(s): TSH, T4TOTAL, FREET4, T3FREE, THYROIDAB in the last 72 hours. Anemia Panel: No results for input(s): VITAMINB12, FOLATE, FERRITIN, TIBC, IRON, RETICCTPCT in the last 72 hours. Sepsis Labs:  Recent Labs Lab 03/04/17 7824 03/04/17 1317 03/04/17 1700  LATICACIDVEN 3.30* 1.94* 1.5    Recent Results (from the past 240 hour(s))  Culture, blood (routine x 2)     Status: None (Preliminary result)   Collection Time: 03/04/17  9:27 AM  Result Value Ref Range Status   Specimen Description BLOOD LEFT ANTECUBITAL  Final   Special Requests   Final    BOTTLES DRAWN AEROBIC AND ANAEROBIC Blood Culture adequate volume   Culture   Final    NO GROWTH 4 DAYS Performed at [facility], [place] [employer] [address], [place], [place] [address]    Report Status PENDING  Incomplete  Culture, blood (routine x 2)     Status: Abnormal   Collection Time: 03/04/17 10:31 AM  Result Value Ref Range Status   Specimen Description BLOOD LEFT HAND  Final   Special Requests   Final    IN PEDIATRIC BOTTLE Blood Culture results may not be optimal due to an excessive volume of blood received in culture bottles   Culture  Setup Time   Final    GRAM NEGATIVE RODS IN PEDIATRIC BOTTLE CRITICAL RESULT CALLED TO, READ BACK BY AND VERIFIED WITH: R RUMBARGER 03/05/17 @ 0914 M VESTAL    Culture PROTEUS MIRABILIS (A)   Final   Report Status 03/07/2017  FINAL  Final   Organism ID, Bacteria PROTEUS MIRABILIS  Final      Susceptibility   Proteus mirabilis - MIC*    AMPICILLIN <=2 SENSITIVE Sensitive     CEFAZOLIN <=4 SENSITIVE Sensitive     CEFEPIME <=1 SENSITIVE Sensitive     CEFTAZIDIME <=1 SENSITIVE Sensitive     CEFTRIAXONE <=1 SENSITIVE Sensitive     CIPROFLOXACIN <=0.25 SENSITIVE Sensitive     GENTAMICIN <=1 SENSITIVE Sensitive     IMIPENEM 4 SENSITIVE Sensitive     TRIMETH/SULFA <=20 SENSITIVE Sensitive     AMPICILLIN/SULBACTAM <=2 SENSITIVE Sensitive     PIP/TAZO <=4 SENSITIVE Sensitive     * PROTEUS MIRABILIS  Blood Culture ID Panel (Reflexed)     Status: Abnormal   Collection Time: 03/04/17 10:31 AM  Result Value Ref Range Status   Enterococcus species NOT DETECTED NOT DETECTED Final   Listeria monocytogenes NOT DETECTED NOT DETECTED Final   Staphylococcus species NOT DETECTED NOT DETECTED Final   Staphylococcus aureus NOT DETECTED NOT DETECTED Final   Streptococcus species NOT DETECTED NOT DETECTED Final   Streptococcus agalactiae NOT DETECTED NOT DETECTED Final   Streptococcus pneumoniae NOT DETECTED NOT DETECTED Final   Streptococcus pyogenes NOT DETECTED NOT DETECTED Final   Acinetobacter baumannii NOT DETECTED NOT DETECTED Final   Enterobacteriaceae species DETECTED (A) NOT DETECTED Final    Comment: Enterobacteriaceae represent a large family of gram-negative bacteria, not a single organism. CRITICAL RESULT CALLED TO, READ BACK BY AND VERIFIED WITH: R RUMBARGER,PHARMD AT 0914 03/05/17 BY L BENFIELD    Enterobacter cloacae complex NOT DETECTED NOT DETECTED Final   Escherichia coli NOT DETECTED NOT DETECTED Final   Klebsiella oxytoca NOT DETECTED NOT DETECTED Final   Klebsiella pneumoniae NOT DETECTED NOT DETECTED Final   Proteus species DETECTED (A) NOT DETECTED Final    Comment: CRITICAL RESULT CALLED TO, READ BACK BY AND VERIFIED WITH: R RUMBARGER,PHARMD AT 0914 03/05/17 BY M  VESTAL    Serratia marcescens NOT DETECTED NOT DETECTED Final   Carbapenem resistance NOT DETECTED NOT DETECTED Final   Haemophilus influenzae NOT DETECTED NOT DETECTED Final   Neisseria meningitidis NOT DETECTED NOT DETECTED Final   Pseudomonas aeruginosa NOT DETECTED NOT DETECTED Final   Candida albicans NOT DETECTED NOT DETECTED Final   Candida glabrata NOT DETECTED NOT DETECTED Final   Candida krusei NOT DETECTED NOT DETECTED Final   Candida parapsilosis NOT DETECTED NOT DETECTED Final   Candida tropicalis NOT DETECTED NOT DETECTED Final    Comment: Performed at [facility], [place] [address] [place], [place] [address]  Respiratory Panel by PCR     Status: None   Collection Time: 03/04/17  2:56 PM  Result Value Ref Range Status   Adenovirus NOT DETECTED NOT DETECTED Final   Coronavirus 229E NOT DETECTED NOT DETECTED Final   Coronavirus HKU1 NOT DETECTED NOT DETECTED Final   Coronavirus NL63 NOT DETECTED NOT DETECTED Final   Coronavirus OC43 NOT DETECTED NOT DETECTED Final   Metapneumovirus NOT DETECTED NOT DETECTED Final   Rhinovirus / Enterovirus NOT DETECTED NOT DETECTED Final   Influenza A NOT DETECTED NOT DETECTED Final   Influenza B NOT DETECTED NOT DETECTED Final   Parainfluenza Virus 1 NOT DETECTED NOT DETECTED Final   Parainfluenza Virus 2 NOT DETECTED NOT DETECTED Final   Parainfluenza Virus 3 NOT DETECTED NOT DETECTED Final   Parainfluenza Virus 4 NOT DETECTED NOT DETECTED Final   Respiratory Syncytial Virus NOT DETECTED NOT DETECTED Final  Bordetella pertussis NOT DETECTED NOT DETECTED Final   Chlamydophila pneumoniae NOT DETECTED NOT DETECTED Final   Mycoplasma pneumoniae NOT DETECTED NOT DETECTED Final    Comment: Performed at [facility], [place] [address] [place], [place] [address]  MRSA PCR Screening     Status: None   Collection Time: 03/04/17  9:50 PM  Result Value Ref Range Status   MRSA by PCR NEGATIVE NEGATIVE Final    Comment:        The  GeneXpert MRSA Assay (FDA approved for NASAL specimens only), is one component of a comprehensive MRSA colonization surveillance program. It is not intended to diagnose MRSA infection nor to guide or monitor treatment for MRSA infections.   Urine culture     Status: None   Collection Time: 03/07/17  1:10 PM  Result Value Ref Range Status   Specimen Description URINE, CATHETERIZED  Final   Special Requests NONE  Final   Culture NO GROWTH  Final   Report Status 03/08/2017 FINAL  Final         Radiology Studies: No results found.      Scheduled Meds: . allopurinol  100 mg Oral Daily  . aspirin EC  81 mg Oral Daily  . calcitRIOL  3.5 mcg Oral Q M,W,F-HD  . cinacalcet  60 mg Oral Q breakfast  . darbepoetin (ARANESP) injection - DIALYSIS  40 mcg Intravenous Q Mon-HD  . ferric citrate  630 mg Oral TID WC  . gabapentin  200 mg Oral BID  . heparin  10,000 Units Dialysis Once in dialysis  . insulin aspart  0-9 Units Subcutaneous TID WC  . insulin aspart  20 Units Subcutaneous TID WC  . insulin detemir  25 Units Subcutaneous BID  . lidocaine-EPINEPHrine  5 mL Intradermal Once  . pravastatin  40 mg Oral QHS   Continuous Infusions: . sodium chloride    . sodium chloride    . vancomycin       LOS: 5 days    Time spent: 73 minutes    THOMPSON,DANIEL, MD [facility] Pager [phone]  If 7PM-7AM, please contact night-coverage [email or web address] Password [facility] 03/09/2017, 9:53 AM

## 2017-03-09 NOTE — Progress Notes (Addendum)
I discussed with the patient incision and drainage of his scrotum.  The patient's posterior scrotum was prepped with Betadine.  10 mL 1% lidocaine were used to infiltrate around the necrotic area in the posterior mid scrotum.  This necrotic area was then debrided sharply, removing approximately a 2 x 2 by 1 centimeter area of necrotic skin.  At the base of the wound, I then probed the right and left hemiscrotum as much as the patient left me.  I did not come across any purulent pockets.  The wound was then packed with sterile gauze wet-to-dry.  He tolerated the procedure well.  I will come by tomorrow.  If the process does not look significantly better, I will consider operative debridement/scrotal exploration on Friday.

## 2017-03-09 NOTE — Progress Notes (Signed)
Pharmacy Antibiotic Note  [name] is a 61 y.o. male admitted on 03/04/2017 with sepsis and a possible scrotal infection, also found to have Proteus bacteremia 1/2 (UC negative).  Pharmacy has been consulted for cefazolin + vancomycin dosing (vancomycin added per per Renal with ongoing close eval by Urology with plans for I&D of suspicious area of scrotom).  Random vancomycin level was 37 on 4/22 - calculated level now down to ~13 s/p 2 HD sessions. Will give vancomycin 1500mg  IV x 1 dose post-HD today for level calculated ~18, then continue usual vancomycin 1g IV doses with HD for now (would check level sooner rather later with weight ~150kg)  ID recommends 2 weeks of antibiotics with HD. Patient got an extra HD session 4/24 to help with scrotal swelling. Now back on schedule and tolerating full HD sessions.  Plan: Vancomycin 1500mg  IV x 1 QHD today; then vancomycin 1g IV QHD-MWF Continue Ancef 2g IV QHD-MWF Monitor clinical progress, c/s Pre-HD vancomycin level as indicated Monitor HD schedule/tolerance   Height: 6' (182.9 cm) Weight: (!) 329 lb 12.9 oz (149.6 kg) IBW/kg (Calculated) : 77.6  Temp (24hrs), Avg:99.1 F (37.3 C), Min:98 F (36.7 C), Max:100.8 F (38.2 C)   Recent Labs Lab 03/04/17 0953 03/04/17 1317 03/04/17 1457 03/04/17 1700 03/05/17 0900 03/06/17 0701 03/07/17 0555 03/08/17 0259 03/09/17 0717  WBC  --   --  19.1*  --   --  18.1* 14.2* 16.9* 19.9*  CREATININE  --   --  11.96*  --  14.60* 13.56* 15.04* 10.61* 10.36*  LATICACIDVEN 3.30* 1.94*  --  1.5  --   --   --   --   --   VANCORANDOM  --   --   --   --   --  37  --   --   --     Estimated Creatinine Clearance: 11.6 mL/min (A) (by C-G formula based on SCr of 10.36 mg/dL (H)).    Allergies  Allergen Reactions  . Penicillins Nausea And Vomiting and Other (See Comments)    Has patient had a PCN reaction causing immediate rash, facial/tongue/throat swelling, SOB or lightheadedness with  hypotension: No Has patient had a PCN reaction causing severe rash involving mucus membranes or skin necrosis: No Has patient had a PCN reaction that required hospitalization No Has patient had a PCN reaction occurring within the last 10 years: No If all of the above answers are "NO", then may proceed with Cephalosporin use.  . Vioxx [Rofecoxib] Nausea And Vomiting   Antimicrobials this admission:  4/20 vancomycin>>4/22; 4/25>> 4/20 Zosyn>>4/21 4/21 CTX >> 4/23 4/23 Ancef >>  Microbiology results:  4/20BCx: 1/2 proteus- pan sensitive 4/20 BCID: proteus species  4/20MRSA PCR: negative  4/20 resp panel: negative 4/20 MRSA PCR: negative   Elicia Lamp, PharmD, BCPS Clinical Pharmacist 03/09/2017 10:16 AM

## 2017-03-09 NOTE — Progress Notes (Signed)
Patient ID: [name], male   DOB: September 30, 1956, 61 y.o.   MRN: [id number]  [facility] Progress Note   Assessment/ Plan:   1. Hypotension/fever/scrotal and penile pain with swelling: With Proteus bacteremia (1/2 blood culture) but negative urine cultures. Ongoing close evaluation by urology with plans for incision and drainage of suspicious area of fluctuance on posterior aspect of scrotum. Currently on cefazolin which does not cover MRSA-will need to switch to vancomycin which can be administered at dialysis upon discharge. Foley catheter discontinued per urology recommendations.  2. ESRD: Underwent additional hemodialysis yesterday to try and see if aggressive ultrafiltration could positively impact his shortness of breath. Scheduled for routine hemodialysis today per his usual outpatient Monday/Wednesday/Friday schedule. 3. Anemia: Hemoglobin levels borderline, continue ESA  4. CKD-MBD Phosphorus levels improving on current binders, continue Sensipar and calcitriol for PTH suppression  5. Hypertension:fair BP control, monitor with HD/UF  Subjective:   Reports to be feeling somewhat better with regards to scrotal swelling and pain and penile discomfort feels much better after removal of Foley catheter.    Objective:   BP 106/62 (BP Location: Left Arm)   Pulse 89   Temp 99.1 F (37.3 C) (Oral)   Resp (!) 24   Ht 6' (1.829 m)   Wt (!) 149.6 kg (329 lb 12.9 oz)   SpO2 94%   BMI 44.73 kg/m   Physical Exam: WUJ:[id number] to be resting comfortably in bed WGN:FAOZH regular rhythm and normal rate, normal S1 and S2 Resp: Anteriorly clear to auscultation, no rales/rhonchi.  YQM:VHQI, obese, NT Ext:No LE edema  Labs: BMET  Recent Labs Lab 03/04/17 0930 03/04/17 1457 03/05/17 0900 03/06/17 0701 03/07/17 0555 03/08/17 0259 03/09/17 0717  NA 132*  --  130* 128* 128* 133* 130*  K 4.1  --  5.0 4.2 4.8 3.9 3.4*  CL 89*  --  87* 89* 91* 94* 92*  CO2 26  --  22 22 20*  24 23  GLUCOSE 322*  --  337* 275* 336* 115* 220*  BUN 44*  --  56* 57* 76* 48* 41*  CREATININE 11.68* 11.96* 14.60* 13.56* 15.04* 10.61* 10.36*  CALCIUM 8.5*  --  9.3 9.0 8.6* 8.3* 9.1  PHOS  --   --  7.9* 7.7* 8.7* 5.6* 5.2*   CBC  Recent Labs Lab 03/04/17 0930  03/06/17 0701 03/07/17 0555 03/08/17 0259 03/09/17 0717  WBC 23.2*  < > 18.1* 14.2* 16.9* 19.9*  NEUTROABS 18.7*  --  14.6*  --   --  PENDING  HGB 11.2*  < > 9.7* 10.1* 9.7* 10.1*  HCT 34.8*  < > 30.4* 31.3* 30.6* 31.6*  MCV 78.2  < > 75.8* 75.1* 75.6* 76.0*  PLT 140*  < > 126* 132* 166 191  < > = values in this interval not displayed. Medications:    . allopurinol  100 mg Oral Daily  . aspirin EC  81 mg Oral Daily  . calcitRIOL  3.5 mcg Oral Q M,W,F-HD  . cinacalcet  60 mg Oral Q breakfast  . darbepoetin (ARANESP) injection - DIALYSIS  40 mcg Intravenous Q Mon-HD  . ferric citrate  630 mg Oral TID WC  . gabapentin  200 mg Oral BID  . heparin  10,000 Units Dialysis Once in dialysis  . insulin aspart  0-9 Units Subcutaneous TID WC  . insulin aspart  20 Units Subcutaneous TID WC  . insulin detemir  25 Units Subcutaneous BID  . lidocaine-EPINEPHrine  5 mL  Intradermal Once  . pravastatin  40 mg Oral QHS   Elmarie Shiley, MD 03/09/2017, 9:27 AM

## 2017-03-10 LAB — CBC WITH DIFFERENTIAL/PLATELET
BASOS ABS: 0 10*3/uL (ref 0.0–0.1)
BLASTS: 0 %
Band Neutrophils: 6 %
Basophils Relative: 0 %
Eosinophils Absolute: 0.7 10*3/uL (ref 0.0–0.7)
Eosinophils Relative: 3 %
HEMATOCRIT: 30.7 % — AB (ref 39.0–52.0)
HEMOGLOBIN: 9.9 g/dL — AB (ref 13.0–17.0)
LYMPHS PCT: 16 %
Lymphs Abs: 3.7 10*3/uL (ref 0.7–4.0)
MCH: 24.4 pg — ABNORMAL LOW (ref 26.0–34.0)
MCHC: 32.2 g/dL (ref 30.0–36.0)
MCV: 75.8 fL — ABNORMAL LOW (ref 78.0–100.0)
METAMYELOCYTES PCT: 0 %
MONOS PCT: 11 %
Monocytes Absolute: 2.5 10*3/uL — ABNORMAL HIGH (ref 0.1–1.0)
Myelocytes: 0 %
Neutro Abs: 16.2 10*3/uL — ABNORMAL HIGH (ref 1.7–7.7)
Neutrophils Relative %: 64 %
Other: 0 %
Platelets: 249 10*3/uL (ref 150–400)
Promyelocytes Absolute: 0 %
RBC: 4.05 MIL/uL — AB (ref 4.22–5.81)
RDW: 16.8 % — ABNORMAL HIGH (ref 11.5–15.5)
WBC: 23.1 10*3/uL — AB (ref 4.0–10.5)
nRBC: 1 /100 WBC — ABNORMAL HIGH

## 2017-03-10 LAB — RENAL FUNCTION PANEL
ALBUMIN: 2 g/dL — AB (ref 3.5–5.0)
ANION GAP: 16 — AB (ref 5–15)
BUN: 52 mg/dL — AB (ref 6–20)
CO2: 21 mmol/L — ABNORMAL LOW (ref 22–32)
Calcium: 8.8 mg/dL — ABNORMAL LOW (ref 8.9–10.3)
Chloride: 92 mmol/L — ABNORMAL LOW (ref 101–111)
Creatinine, Ser: 12.52 mg/dL — ABNORMAL HIGH (ref 0.61–1.24)
GFR, EST AFRICAN AMERICAN: 4 mL/min — AB (ref >60)
GFR, EST NON AFRICAN AMERICAN: 4 mL/min — AB (ref >60)
Glucose, Bld: 84 mg/dL (ref 65–99)
PHOSPHORUS: 6.6 mg/dL — AB (ref 2.5–4.6)
POTASSIUM: 3.8 mmol/L (ref 3.5–5.1)
Sodium: 129 mmol/L — ABNORMAL LOW (ref 135–145)

## 2017-03-10 LAB — GLUCOSE, CAPILLARY
GLUCOSE-CAPILLARY: 91 mg/dL (ref 65–99)
Glucose-Capillary: 234 mg/dL — ABNORMAL HIGH (ref 65–99)
Glucose-Capillary: 214 mg/dL — ABNORMAL HIGH (ref 65–99)

## 2017-03-10 LAB — SURGICAL PCR SCREEN
MRSA, PCR: NEGATIVE
STAPHYLOCOCCUS AUREUS: NEGATIVE

## 2017-03-10 MED ORDER — HEPARIN SODIUM (PORCINE) 1000 UNIT/ML DIALYSIS: 4000.0000 [IU] | INTRAMUSCULAR | Status: DC | PRN

## 2017-03-10 MED ORDER — LIDOCAINE-PRILOCAINE 2.5-2.5 % EX CREA: 1.0000 "application " | TOPICAL_CREAM | CUTANEOUS | Status: DC | PRN

## 2017-03-10 MED ORDER — HEPARIN SODIUM (PORCINE) 1000 UNIT/ML DIALYSIS: 1000.0000 [IU] | INTRAMUSCULAR | Status: DC | PRN

## 2017-03-10 MED ORDER — PENTAFLUOROPROP-TETRAFLUOROETH EX AERO: 1.0000 "application " | INHALATION_SPRAY | CUTANEOUS | Status: DC | PRN

## 2017-03-10 MED ORDER — SODIUM CHLORIDE 0.9 % IV SOLN: 100.0000 mL | INTRAVENOUS | Status: DC | PRN

## 2017-03-10 MED ORDER — LIDOCAINE HCL (PF) 1 % IJ SOLN: 5.0000 mL | INTRAMUSCULAR | Status: DC | PRN

## 2017-03-10 MED ORDER — ALTEPLASE 2 MG IJ SOLR: 2.0000 mg | Freq: Once | INTRAMUSCULAR | Status: DC | PRN

## 2017-03-10 MED ORDER — CEFAZOLIN SODIUM-DEXTROSE 2-4 GM/100ML-% IV SOLN
2.0000 g | Freq: Once | INTRAVENOUS | Status: AC
  Administered 2017-03-10: 2 g via INTRAVENOUS
  Filled 2017-03-10: qty 100

## 2017-03-10 MED ORDER — ACETAMINOPHEN 325 MG PO TABS
ORAL_TABLET | ORAL | Status: AC
  Administered 2017-03-10: 650 mg via ORAL
  Filled 2017-03-10: qty 2

## 2017-03-10 MED ORDER — VANCOMYCIN HCL IN DEXTROSE 1-5 GM/200ML-% IV SOLN
1000.0000 mg | Freq: Once | INTRAVENOUS | Status: AC
  Administered 2017-03-10: 1000 mg via INTRAVENOUS
  Filled 2017-03-10: qty 200

## 2017-03-10 NOTE — Progress Notes (Signed)
Subjective: Patient reports that he is not feeling worse, but these had a lot of drainage from his scrotal area overnight.  Objective: Vital signs in last 24 hours: Temp:  [97.8 F (36.6 C)-99.3 F (37.4 C)] 98.5 F (36.9 C) (04/26 1130) Pulse Rate:  [69-82] 81 (04/26 1130) Resp:  [18-24] 18 (04/26 1130) BP: (103-154)/(43-88) 136/69 (04/26 1130) SpO2:  [93 %-96 %] 96 % (04/26 1130) Weight:  [154.8 kg (341 lb 4.4 oz)-158.2 kg (348 lb 12.8 oz)] 154.8 kg (341 lb 4.4 oz) (04/26 1130)  Intake/Output from previous day: 04/25 0701 - 04/26 0700 In: 720 [P.O.:720] Out: 1 [Stool:1] Intake/Output this shift: Total I/O In: 0  Out: 2868 [Other:2868]  Physical Exam:  Constitutional: Vital signs reviewed. WD WN in NAD   Eyes: PERRL, No scleral icterus.   Pulmonary/Chest: Normal effort Abdominal: Soft. Non-tender, non-distended, bowel sounds are normal, no masses, organomegaly, or guarding present.  Genitourinary: Persistent scrotal and penile swelling.  Anterior scrotum and scrotal skin seems viable.  Posteriorly, there is still a necrotic area with drainage.   Lab Results:  Recent Labs  03/08/17 0259 03/09/17 0717 03/10/17 0542  HGB 9.7* 10.1* 9.9*  HCT 30.6* 31.6* 30.7*   BMET  Recent Labs  03/09/17 0717 03/10/17 0542  NA 130* 129*  K 3.4* 3.8  CL 92* 92*  CO2 23 21*  GLUCOSE 220* 84  BUN 41* 52*  CREATININE 10.36* 12.52*  CALCIUM 9.1 8.8*   No results for input(s): LABPT, INR in the last 72 hours. No results for input(s): LABURIN in the last 72 hours. Results for orders placed or performed during the hospital encounter of 03/04/17  Culture, blood (routine x 2)     Status: None   Collection Time: 03/04/17  9:27 AM  Result Value Ref Range Status   Specimen Description BLOOD LEFT ANTECUBITAL  Final   Special Requests   Final    BOTTLES DRAWN AEROBIC AND ANAEROBIC Blood Culture adequate volume   Culture   Final    NO GROWTH 5 DAYS Performed at [facility], [place] [address], [place],  [address]    Report Status 03/09/2017 FINAL  Final  Culture, blood (routine x 2)     Status: Abnormal   Collection Time: 03/04/17 10:31 AM  Result Value Ref Range Status   Specimen Description BLOOD LEFT HAND  Final   Special Requests   Final    IN PEDIATRIC BOTTLE Blood Culture results may not be optimal due to an excessive volume of blood received in culture bottles   Culture  Setup Time   Final    GRAM NEGATIVE RODS IN PEDIATRIC BOTTLE CRITICAL RESULT CALLED TO, READ BACK BY AND VERIFIED WITH: R RUMBARGER 03/05/17 @ 0914 M VESTAL    Culture PROTEUS MIRABILIS (A)  Final   Report Status 03/07/2017 FINAL  Final   Organism ID, Bacteria PROTEUS MIRABILIS  Final      Susceptibility   Proteus mirabilis - MIC*    AMPICILLIN <=2 SENSITIVE Sensitive     CEFAZOLIN <=4 SENSITIVE Sensitive     CEFEPIME <=1 SENSITIVE Sensitive     CEFTAZIDIME <=1 SENSITIVE Sensitive     CEFTRIAXONE <=1 SENSITIVE Sensitive     CIPROFLOXACIN <=0.25 SENSITIVE Sensitive     GENTAMICIN <=1 SENSITIVE Sensitive     IMIPENEM 4 SENSITIVE Sensitive     TRIMETH/SULFA <=20 SENSITIVE Sensitive     AMPICILLIN/SULBACTAM <=2 SENSITIVE Sensitive     PIP/TAZO <=4 SENSITIVE Sensitive     *  PROTEUS MIRABILIS  Blood Culture ID Panel (Reflexed)     Status: Abnormal   Collection Time: 03/04/17 10:31 AM  Result Value Ref Range Status   Enterococcus species NOT DETECTED NOT DETECTED Final   Listeria monocytogenes NOT DETECTED NOT DETECTED Final   Staphylococcus species NOT DETECTED NOT DETECTED Final   Staphylococcus aureus NOT DETECTED NOT DETECTED Final   Streptococcus species NOT DETECTED NOT DETECTED Final   Streptococcus agalactiae NOT DETECTED NOT DETECTED Final   Streptococcus pneumoniae NOT DETECTED NOT DETECTED Final   Streptococcus pyogenes NOT DETECTED NOT DETECTED Final   Acinetobacter baumannii NOT DETECTED NOT DETECTED Final   Enterobacteriaceae species DETECTED (A) NOT  DETECTED Final    Comment: Enterobacteriaceae represent a large family of gram-negative bacteria, not a single organism. CRITICAL RESULT CALLED TO, READ BACK BY AND VERIFIED WITH: R RUMBARGER,PHARMD AT 0914 03/05/17 BY L BENFIELD    Enterobacter cloacae complex NOT DETECTED NOT DETECTED Final   Escherichia coli NOT DETECTED NOT DETECTED Final   Klebsiella oxytoca NOT DETECTED NOT DETECTED Final   Klebsiella pneumoniae NOT DETECTED NOT DETECTED Final   Proteus species DETECTED (A) NOT DETECTED Final    Comment: CRITICAL RESULT CALLED TO, READ BACK BY AND VERIFIED WITH: R RUMBARGER,PHARMD AT 0914 03/05/17 BY M VESTAL    Serratia marcescens NOT DETECTED NOT DETECTED Final   Carbapenem resistance NOT DETECTED NOT DETECTED Final   Haemophilus influenzae NOT DETECTED NOT DETECTED Final   Neisseria meningitidis NOT DETECTED NOT DETECTED Final   Pseudomonas aeruginosa NOT DETECTED NOT DETECTED Final   Candida albicans NOT DETECTED NOT DETECTED Final   Candida glabrata NOT DETECTED NOT DETECTED Final   Candida krusei NOT DETECTED NOT DETECTED Final   Candida parapsilosis NOT DETECTED NOT DETECTED Final   Candida tropicalis NOT DETECTED NOT DETECTED Final    Comment: Performed at [facility], [place] [address] [place], [place] [address]  Respiratory Panel by PCR     Status: None   Collection Time: 03/04/17  2:56 PM  Result Value Ref Range Status   Adenovirus NOT DETECTED NOT DETECTED Final   Coronavirus 229E NOT DETECTED NOT DETECTED Final   Coronavirus HKU1 NOT DETECTED NOT DETECTED Final   Coronavirus NL63 NOT DETECTED NOT DETECTED Final   Coronavirus OC43 NOT DETECTED NOT DETECTED Final   Metapneumovirus NOT DETECTED NOT DETECTED Final   Rhinovirus / Enterovirus NOT DETECTED NOT DETECTED Final   Influenza A NOT DETECTED NOT DETECTED Final   Influenza B NOT DETECTED NOT DETECTED Final   Parainfluenza Virus 1 NOT DETECTED NOT DETECTED Final   Parainfluenza Virus 2 NOT DETECTED NOT  DETECTED Final   Parainfluenza Virus 3 NOT DETECTED NOT DETECTED Final   Parainfluenza Virus 4 NOT DETECTED NOT DETECTED Final   Respiratory Syncytial Virus NOT DETECTED NOT DETECTED Final   Bordetella pertussis NOT DETECTED NOT DETECTED Final   Chlamydophila pneumoniae NOT DETECTED NOT DETECTED Final   Mycoplasma pneumoniae NOT DETECTED NOT DETECTED Final    Comment: Performed at [facility], [place] [address] [place], [place] [address]  MRSA PCR Screening     Status: None   Collection Time: 03/04/17  9:50 PM  Result Value Ref Range Status   MRSA by PCR NEGATIVE NEGATIVE Final    Comment:        The GeneXpert MRSA Assay (FDA approved for NASAL specimens only), is one component of a comprehensive MRSA colonization surveillance program. It is not intended to diagnose MRSA infection  nor to guide or monitor treatment for MRSA infections.   Urine culture     Status: None   Collection Time: 03/07/17  1:10 PM  Result Value Ref Range Status   Specimen Description URINE, CATHETERIZED  Final   Special Requests NONE  Final   Culture NO GROWTH  Final   Report Status 03/08/2017 FINAL  Final    Studies/Results: No results found.  Assessment/Plan:  It does not seem like his scrotal process is improving significantly.  At this point, I feel it's worthwhile to perform debridement and scrotal exploration under anesthesia.  I cannot find a source of obvious infection, i.e., an abscess cavity.  However, with his swelling and this drainage, I worry that there is significant necrosis underneath the skin, and a procedure to perhaps open up the scrotum, debride the old dead tissue on the scrotum and, possibly, the penis is necessary.  I've discussed this with the patient.  I will see about getting this added onto the schedule tomorrow.  LOS: 6 days   Franchot Gallo M 03/10/2017, 12:53 PM

## 2017-03-10 NOTE — Progress Notes (Signed)
Patient with temp 101.5,tylenol given as previously ordered. K Kirby,NP text paged. Will continue to monitor. [name], Wonda Cheng, Therapist, sports

## 2017-03-10 NOTE — Progress Notes (Signed)
Patient ID: [name], male   DOB: 09-Apr-1956, 61 y.o.   MRN: [id number]  [facility] Progress Note   Assessment/ Plan:   1. Hypotension/fever/scrotal and penile pain with swelling: With Proteus bacteremia (1/2 blood culture) but negative urine cultures. He underwent incision and drainage as well as debridement of necrotic area over the posterior scrotum yesterday and reports significant serosanguineous versus purulent drainage overnight. He'll be evaluated further by urology to see if he warrants further surgical exploration. 2. ESRD: He is usually on a Monday/Wednesday/Friday schedule but unfortunately could not get dialysis yesterday because of difficulty cannulating his fistula. On hemodialysis today after successful cannulation of his fistula-appears that his fistula might be more medial than perceived on physical exam. Recommend using SonoSite while here in the hospital. 3. Anemia: Hemoglobin levels borderline, continue ESA . No indications for transfusion 4. CKD-MBD Phosphorus levels improving on current binders, continue Sensipar and calcitriol for PTH suppression  5. Hypertension:fair BP control, monitor with HD/UF  Subjective:   Reports to be doing fairly-states some discomfort over the penile/scrotal area and reports that his bed linens were changed at least 4 or 5 times overnight because of drainage.    Objective:   BP 103/88 (BP Location: Left Arm)   Pulse 82   Temp 98.3 F (36.8 C) (Oral)   Resp (!) 24   Ht 6' (1.829 m)   Wt (!) 158.2 kg (348 lb 12.8 oz)   SpO2 96%   BMI 47.31 kg/m   Physical Exam: Gen: Resting comfortably at hemodialysis YDX:AJOIN regular rhythm and normal rate, normal S1 and S2 Resp: Anteriorly clear to auscultation, no rales/rhonchi.  OMV:[facility], obese, NT Ext:No LE edema  Labs: BMET  Recent Labs Lab 03/04/17 0930 03/04/17 1457 03/05/17 0900 03/06/17 0701 03/07/17 0555 03/08/17 0259 03/09/17 0717 03/10/17 0542  NA 132*   --  130* 128* 128* 133* 130* 129*  K 4.1  --  5.0 4.2 4.8 3.9 3.4* 3.8  CL 89*  --  87* 89* 91* 94* 92* 92*  CO2 26  --  22 22 20* 24 23 21*  GLUCOSE 322*  --  337* 275* 336* 115* 220* 84  BUN 44*  --  56* 57* 76* 48* 41* 52*  CREATININE 11.68* 11.96* 14.60* 13.56* 15.04* 10.61* 10.36* 12.52*  CALCIUM 8.5*  --  9.3 9.0 8.6* 8.3* 9.1 8.8*  PHOS  --   --  7.9* 7.7* 8.7* 5.6* 5.2* 6.6*   CBC  Recent Labs Lab 03/04/17 0930  03/06/17 0701 03/07/17 0555 03/08/17 0259 03/09/17 0717 03/10/17 0542  WBC 23.2*  < > 18.1* 14.2* 16.9* 19.9* 23.1*  NEUTROABS 18.7*  --  14.6*  --   --  14.9* PENDING  HGB 11.2*  < > 9.7* 10.1* 9.7* 10.1* 9.9*  HCT 34.8*  < > 30.4* 31.3* 30.6* 31.6* 30.7*  MCV 78.2  < > 75.8* 75.1* 75.6* 76.0* 75.8*  PLT 140*  < > 126* 132* 166 191 249  < > = values in this interval not displayed. Medications:    . allopurinol  100 mg Oral Daily  . aspirin EC  81 mg Oral Daily  . calcitRIOL  3.5 mcg Oral Q M,W,F-HD  . cinacalcet  60 mg Oral Q breakfast  . darbepoetin (ARANESP) injection - DIALYSIS  40 mcg Intravenous Q Mon-HD  . ferric citrate  630 mg Oral TID WC  . gabapentin  200 mg Oral BID  . heparin  10,000 Units Dialysis Once in  dialysis  . insulin aspart  0-9 Units Subcutaneous TID WC  . insulin aspart  15 Units Subcutaneous TID WC  . insulin detemir  25 Units Subcutaneous BID  . lidocaine-EPINEPHrine  5 mL Intradermal Once  . pravastatin  40 mg Oral QHS   Elmarie Shiley, MD 03/10/2017, 8:22 AM

## 2017-03-10 NOTE — Procedures (Signed)
Patient seen on Hemodialysis. QB 400, UF goal 3.5L Treatment adjusted as needed.  Elmarie Shiley MD [facility]. Office # [phone] Pager # [phone] 8:25 AM

## 2017-03-10 NOTE — Progress Notes (Signed)
[facility]                                                                              Patient Demographics  [name], is a 61 y.o. male, DOB - August 03, 1956, OJJ:[id number]  Admit date - 03/04/2017   Admitting Physician Debbe Odea, MD  Outpatient Primary MD for the patient is Gara Kroner, MD  Outpatient specialists:   LOS - 6  days    Chief Complaint  Patient presents with  . Shortness of Breath       Brief summary   Patient is a 61 year old male with ESRD on HD, MGUS, Gout, Hep C (recently treated), morbid obesity, sleep apnea who presents from dialysis for sudden shortness of breath and hypotension during the dialysis. Due to severe dyspnea, patient was sent to the hospital from dialysis center. Patient's wife reported that he was also having vomiting and diarrhea for 3 days prior to admission, patient reported his scrotum had been swollen for a few days. Patient noted to have a Proteus bacteremia likely seeded from the urine. Patient empirically on IV antibiotics. ID and urology following, patient underwent I & D on scrotum 03/09/2017.   Assessment & Plan    Principal Problem:  acute respiratory failure with hypoxia - Questionable etiology, morbidly obese patient with hypotension, elevated lactic acid level, sepsis, Proteus bacteremia, likely from scrotal abscess.  -  Patient  reported improvement with shortness of breath after hemodialysis. Influenza PCR is negative.  - Patient with some difficulty in keeping CPAP on through the night. ABG essentially normal.  - Blood cultures 1/2 positive for Proteus species. Also concerning for epididymitis.  - CT angiogram chest negative for PE.  -  2-D echo with normal EF 55-60% and grade 2 diastolic dysfunction .Patient has been seen in consultation by pulmonary. Patient has been changed to IV Ancef.  Active problems Proteus bacteremia/Sepsis - Blood cultures 1/2 positive for Proteus. Likely may have  seeded from the urine, presented with fevers and hypotension at the time of admission. -CT abdomen and pelvis showed subcutaneous edema in the soft tissues of the penis and scrotum however no focal or REM enhancing fluid collection to suggest abscess or any Fournier's gangrene  - ID was consulted and changed IV Rocephin to IV Ancef and recommended two-week course of IV antibiotics with hemodialysis.    hypertension Holding antihypertensive medications.   scrotal swelling/scrotal infection - Urology consulted, underwent I&D on 4/25, blood cultures positive for Proteus  - CT abdomen and pelvis negative for any abscess or Fournier's gangrene  - Seen by urology today recommended possible repeat I&D on 4/27    diabetes mellitus Hemoglobin A1c 13.  -  Continue Levemir, meal coverage, sliding scale insulin   hyperlipidemia Continue statin.  gout Continue allopurinol.   morbid obesity -BMI 46.7, Patient counseled on diet and weight control  obstructive sleep apnea Patient has been advised that it's in his best interest to use CPAP at bedtime, continue CPAP QHS   .   hepatitis C Treated.   Code Status: Full CODE STATUS DVT Prophylaxis: Heparin subcutaneous Family Communication: Discussed in detail with the patient,  all imaging results, lab results explained to the patient  Disposition Plan:   Time Spent in minutes   25 minutes  Procedures:    CT angiogram chest 03/04/2017  Chest x-ray 03/04/2017  Ultrasound of the scrotum 03/04/2017  2-D echo 03/07/2017   I&D on 4/25    Consultants:   Nephrology Urology Pulmonary Infectious disease  Antimicrobials:   IV vancomycin 03/04/2017>>>>>> 03/06/2017  IV Zosyn 03/04/2017>>>> 03/05/2017  IV Rocephin 03/05/2017>>>>> 03/07/2017  IV Ancef 03/07/2017    Medications  Scheduled Meds: . allopurinol  100 mg Oral Daily  . aspirin EC  81 mg Oral Daily  . calcitRIOL  3.5 mcg Oral Q M,W,F-HD  . cinacalcet  60  mg Oral Q breakfast  . darbepoetin (ARANESP) injection - DIALYSIS  40 mcg Intravenous Q Mon-HD  . ferric citrate  630 mg Oral TID WC  . gabapentin  200 mg Oral BID  . insulin aspart  0-9 Units Subcutaneous TID WC  . insulin aspart  15 Units Subcutaneous TID WC  . insulin detemir  25 Units Subcutaneous BID  . pravastatin  40 mg Oral QHS   Continuous Infusions: .  ceFAZolin (ANCEF) IV 2 g (03/10/17 1435)  . vancomycin     PRN Meds:.acetaminophen **OR** acetaminophen, calcium carbonate (dosed in mg elemental calcium), camphor-menthol **AND** hydrOXYzine, chlorproMAZINE, docusate sodium, feeding supplement (NEPRO CARB STEADY), ondansetron **OR** ondansetron (ZOFRAN) IV, sorbitol, zolpidem   Antibiotics   Anti-infectives    Start     Dose/Rate Route Frequency Ordered Stop   03/11/17 1200  vancomycin (VANCOCIN) IVPB 1000 mg/200 mL premix  Status:  Discontinued     1,000 mg 200 mL/hr over 60 Minutes Intravenous Every M-W-F (Hemodialysis) 03/09/17 0935 03/10/17 1348   03/10/17 1430  ceFAZolin (ANCEF) IVPB 2g/100 mL premix     2 g 200 mL/hr over 30 Minutes Intravenous  Once 03/10/17 1342     03/10/17 1430  vancomycin (VANCOCIN) IVPB 1000 mg/200 mL premix     1,000 mg 200 mL/hr over 60 Minutes Intravenous  Once 03/10/17 1345     03/09/17 2000  ceFAZolin (ANCEF) IVPB 2g/100 mL premix  Status:  Discontinued     2 g 200 mL/hr over 30 Minutes Intravenous Every M-W-F (2000) 03/08/17 1118 03/09/17 0935   03/09/17 1800  ceFAZolin (ANCEF) IVPB 2g/100 mL premix  Status:  Discontinued     2 g 200 mL/hr over 30 Minutes Intravenous Every M-W-F (Hemodialysis) 03/07/17 1759 03/08/17 1118   03/09/17 1530  vancomycin (VANCOCIN) 1,500 mg in sodium chloride 0.9 % 500 mL IVPB     1,500 mg 250 mL/hr over 120 Minutes Intravenous  Once 03/09/17 1445 03/09/17 2048   03/09/17 1200  vancomycin (VANCOCIN) 1,500 mg in sodium chloride 0.9 % 500 mL IVPB  Status:  Discontinued     1,500 mg 250 mL/hr over 120 Minutes  Intravenous Every Wed (Hemodialysis) 03/09/17 1011 03/09/17 1445   03/08/17 2000  ceFAZolin (ANCEF) IVPB 2g/100 mL premix     2 g 200 mL/hr over 30 Minutes Intravenous  Once 03/08/17 1118 03/08/17 2114   03/07/17 1830  ceFAZolin (ANCEF) IVPB 2g/100 mL premix     2 g 200 mL/hr over 30 Minutes Intravenous  Once 03/07/17 1758 03/07/17 2056   03/05/17 1800  vancomycin (VANCOCIN) IVPB 750 mg/150 ml premix     750 mg 150 mL/hr over 60 Minutes Intravenous Every T-Th-Sa (Hemodialysis) 03/05/17 1341 03/05/17 2020   03/05/17 1500  cefTRIAXone (ROCEPHIN) 2 g in dextrose 5 %  50 mL IVPB  Status:  Discontinued     2 g 100 mL/hr over 30 Minutes Intravenous Every 24 hours 03/05/17 1427 03/07/17 1753   03/04/17 2200  piperacillin-tazobactam (ZOSYN) IVPB 3.375 g  Status:  Discontinued     3.375 g 12.5 mL/hr over 240 Minutes Intravenous Every 12 hours 03/04/17 1318 03/05/17 1427   03/04/17 1030  vancomycin (VANCOCIN) 2,500 mg in sodium chloride 0.9 % 500 mL IVPB     2,500 mg 250 mL/hr over 120 Minutes Intravenous  Once 03/04/17 1016 03/04/17 1339   03/04/17 1015  piperacillin-tazobactam (ZOSYN) IVPB 3.375 g     3.375 g 100 mL/hr over 30 Minutes Intravenous  Once 03/04/17 1007 03/04/17 1114   03/04/17 1015  vancomycin (VANCOCIN) IVPB 1000 mg/200 mL premix  Status:  Discontinued     1,000 mg 200 mL/hr over 60 Minutes Intravenous  Once 03/04/17 1007 03/04/17 1015        Subjective:   Huie Ghuman was seen and examined today. Improving, afebrile, scrotal pain is improving. Patient denies dizziness, chest pain, shortness of breath, abdominal pain, N/V/D/C, new weakness, numbess, tingling. No acute events overnight.    Objective:   Vitals:   03/10/17 1030 03/10/17 1100 03/10/17 1130 03/10/17 1311  BP: (!) 148/66 (!) 154/43 136/69 130/61  Pulse: 76 74 81 83  Resp:   18 18  Temp:   98.5 F (36.9 C) 98.5 F (36.9 C)  TempSrc:   Oral Oral  SpO2:   96% 94%  Weight:   (!) 154.8 kg (341 lb 4.4 oz)     Height:        Intake/Output Summary (Last 24 hours) at 03/10/17 1438 Last data filed at 03/10/17 1300  Gross per 24 hour  Intake              720 ml  Output             2869 ml  Net            -2149 ml     Wt Readings from Last 3 Encounters:  03/10/17 (!) 154.8 kg (341 lb 4.4 oz)  02/03/17 (!) 154.2 kg (340 lb)  01/24/17 (!) 158.8 kg (350 lb)     Exam  General: Alert and oriented x 3, NAD  HEENT:    Neck: Supple, no JVD, no masses  Cardiovascular: S1 S2 auscultated, no rubs, murmurs or gallops. Regular rate and rhythm.  Respiratory: Clear to auscultation bilaterally, no wheezing, rales or rhonchi  Gastrointestinal: Soft, nontender, nondistended, + bowel sounds  Ext: no cyanosis clubbing or edema  Neuro: AAOx3, Cr N's II- XII. Strength 5/5 upper and lower extremities bilaterally  Skin: No rashes  Psych: Normal affect and demeanor, alert and oriented x3   Genitourinary: Persistent scrotal and penile swelling   Data Reviewed:  I have personally reviewed following labs and imaging studies  Micro Results Recent Results (from the past 240 hour(s))  Culture, blood (routine x 2)     Status: None   Collection Time: 03/04/17  9:27 AM  Result Value Ref Range Status   Specimen Description BLOOD LEFT ANTECUBITAL  Final   Special Requests   Final    BOTTLES DRAWN AEROBIC AND ANAEROBIC Blood Culture adequate volume   Culture   Final    NO GROWTH 5 DAYS Performed at [facility], [place] [address], [place], [place] [address]    Report Status 03/09/2017 FINAL  Final  Culture, blood (routine x  2)     Status: Abnormal   Collection Time: 03/04/17 10:31 AM  Result Value Ref Range Status   Specimen Description BLOOD LEFT HAND  Final   Special Requests   Final    IN PEDIATRIC BOTTLE Blood Culture results may not be optimal due to an excessive volume of blood received in culture bottles   Culture  Setup Time   Final    GRAM NEGATIVE RODS IN PEDIATRIC BOTTLE CRITICAL  RESULT CALLED TO, READ BACK BY AND VERIFIED WITH: R RUMBARGER 03/05/17 @ 0914 M VESTAL    Culture PROTEUS MIRABILIS (A)  Final   Report Status 03/07/2017 FINAL  Final   Organism ID, Bacteria PROTEUS MIRABILIS  Final      Susceptibility   Proteus mirabilis - MIC*    AMPICILLIN <=2 SENSITIVE Sensitive     CEFAZOLIN <=4 SENSITIVE Sensitive     CEFEPIME <=1 SENSITIVE Sensitive     CEFTAZIDIME <=1 SENSITIVE Sensitive     CEFTRIAXONE <=1 SENSITIVE Sensitive     CIPROFLOXACIN <=0.25 SENSITIVE Sensitive     GENTAMICIN <=1 SENSITIVE Sensitive     IMIPENEM 4 SENSITIVE Sensitive     TRIMETH/SULFA <=20 SENSITIVE Sensitive     AMPICILLIN/SULBACTAM <=2 SENSITIVE Sensitive     PIP/TAZO <=4 SENSITIVE Sensitive     * PROTEUS MIRABILIS  Blood Culture ID Panel (Reflexed)     Status: Abnormal   Collection Time: 03/04/17 10:31 AM  Result Value Ref Range Status   Enterococcus species NOT DETECTED NOT DETECTED Final   Listeria monocytogenes NOT DETECTED NOT DETECTED Final   Staphylococcus species NOT DETECTED NOT DETECTED Final   Staphylococcus aureus NOT DETECTED NOT DETECTED Final   Streptococcus species NOT DETECTED NOT DETECTED Final   Streptococcus agalactiae NOT DETECTED NOT DETECTED Final   Streptococcus pneumoniae NOT DETECTED NOT DETECTED Final   Streptococcus pyogenes NOT DETECTED NOT DETECTED Final   Acinetobacter baumannii NOT DETECTED NOT DETECTED Final   Enterobacteriaceae species DETECTED (A) NOT DETECTED Final    Comment: Enterobacteriaceae represent a large family of gram-negative bacteria, not a single organism. CRITICAL RESULT CALLED TO, READ BACK BY AND VERIFIED WITH: R RUMBARGER,PHARMD AT 0914 03/05/17 BY L BENFIELD    Enterobacter cloacae complex NOT DETECTED NOT DETECTED Final   Escherichia coli NOT DETECTED NOT DETECTED Final   Klebsiella oxytoca NOT DETECTED NOT DETECTED Final   Klebsiella pneumoniae NOT DETECTED NOT DETECTED Final   Proteus species DETECTED (A) NOT DETECTED  Final    Comment: CRITICAL RESULT CALLED TO, READ BACK BY AND VERIFIED WITH: R RUMBARGER,PHARMD AT 0914 03/05/17 BY M VESTAL    Serratia marcescens NOT DETECTED NOT DETECTED Final   Carbapenem resistance NOT DETECTED NOT DETECTED Final   Haemophilus influenzae NOT DETECTED NOT DETECTED Final   Neisseria meningitidis NOT DETECTED NOT DETECTED Final   Pseudomonas aeruginosa NOT DETECTED NOT DETECTED Final   Candida albicans NOT DETECTED NOT DETECTED Final   Candida glabrata NOT DETECTED NOT DETECTED Final   Candida krusei NOT DETECTED NOT DETECTED Final   Candida parapsilosis NOT DETECTED NOT DETECTED Final   Candida tropicalis NOT DETECTED NOT DETECTED Final    Comment: Performed at [facility], [place] [address] [place], [place] [address]  Respiratory Panel by PCR     Status: None   Collection Time: 03/04/17  2:56 PM  Result Value Ref Range Status   Adenovirus NOT DETECTED NOT DETECTED Final   Coronavirus 229E NOT DETECTED NOT DETECTED Final   Coronavirus  HKU1 NOT DETECTED NOT DETECTED Final   Coronavirus NL63 NOT DETECTED NOT DETECTED Final   Coronavirus OC43 NOT DETECTED NOT DETECTED Final   Metapneumovirus NOT DETECTED NOT DETECTED Final   Rhinovirus / Enterovirus NOT DETECTED NOT DETECTED Final   Influenza A NOT DETECTED NOT DETECTED Final   Influenza B NOT DETECTED NOT DETECTED Final   Parainfluenza Virus 1 NOT DETECTED NOT DETECTED Final   Parainfluenza Virus 2 NOT DETECTED NOT DETECTED Final   Parainfluenza Virus 3 NOT DETECTED NOT DETECTED Final   Parainfluenza Virus 4 NOT DETECTED NOT DETECTED Final   Respiratory Syncytial Virus NOT DETECTED NOT DETECTED Final   Bordetella pertussis NOT DETECTED NOT DETECTED Final   Chlamydophila pneumoniae NOT DETECTED NOT DETECTED Final   Mycoplasma pneumoniae NOT DETECTED NOT DETECTED Final    Comment: Performed at [facility], [place] [address], [place], [place] [address]  MRSA PCR Screening     Status: None   Collection  Time: 03/04/17  9:50 PM  Result Value Ref Range Status   MRSA by PCR NEGATIVE NEGATIVE Final    Comment:        The GeneXpert MRSA Assay (FDA approved for NASAL specimens only), is one component of a comprehensive MRSA colonization surveillance program. It is not intended to diagnose MRSA infection nor to guide or monitor treatment for MRSA infections.   Urine culture     Status: None   Collection Time: 03/07/17  1:10 PM  Result Value Ref Range Status   Specimen Description URINE, CATHETERIZED  Final   Special Requests NONE  Final   Culture NO GROWTH  Final   Report Status 03/08/2017 FINAL  Final    Radiology Reports Dg Chest 2 View  Result Date: 03/04/2017 CLINICAL DATA:  Increased shortness of breath and decreased O2 saturations. EXAM: CHEST  2 VIEW COMPARISON:  01/07/2014 FINDINGS: Azygos shadow and central vascular structures are mildly prominent. Findings could be related to vascular congestion. There is no frank pulmonary edema. Heart size is within limits. No pleural effusions. No acute bone abnormality. Negative for a pneumothorax. IMPRESSION: Probable vascular congestion without overt pulmonary edema. Electronically Signed   By: Markus Daft M.D.   On: 03/04/2017 09:25   Ct Angio Chest Pe W/cm &/or Wo Cm  Result Date: 03/04/2017 CLINICAL DATA:  61 year old male with increased shortness of breath and hypoxia following dialysis earlier today. EXAM: CT ANGIOGRAPHY CHEST WITH CONTRAST TECHNIQUE: Multidetector CT imaging of the chest was performed using the standard protocol during bolus administration of intravenous contrast. Multiplanar CT image reconstructions and MIPs were obtained to evaluate the vascular anatomy. CONTRAST:  100 mL Isovue 370 COMPARISON:  Chest x-ray 03/04/2017; prior CT scan of the chest 07/09/2014 FINDINGS: Cardiovascular: Limited opacification of the pulmonary arteries secondary to transient physiologic interruption of contrast. No central or proximal or lobar  PE. No evidence of aortic dissection or aneurysm. Aberrant right subclavian artery noticed incidentally. The heart is enlarged. No pericardial effusion. Mediastinum/Nodes: Unremarkable CT appearance of the thyroid gland. No suspicious mediastinal or hilar adenopathy. No soft tissue mediastinal mass. The thoracic esophagus is unremarkable. Lungs/Pleura: Respiratory motion artifact limits evaluation for small pulmonary nodules. No focal airspace consolidation, pleural effusion, pneumothorax or pulmonary edema. Mild dependent atelectasis present in both lower lobes. Upper Abdomen: Although the liver is incompletely evaluated, the left hepatic lobe and caudate lobe appear relatively hypertrophied which may suggest early cirrhotic change. No acute abnormalities within the upper abdomen. Musculoskeletal: No acute fracture or aggressive  appearing lytic or blastic osseous lesion. Review of the MIP images confirms the above findings. IMPRESSION: 1. No evidence of acute pulmonary embolus to the level of the proximal lobar arteries. Evaluation of the more distal pulmonary arterial tree is nondiagnostic secondary to a combination of relatively poor vessel opacification and respiratory motion artifact. 2. No evidence of pneumonia or other acute cardiopulmonary process. 3. Bibasilar subsegmental atelectasis. 4. Aberrant right subclavian artery. 5. Although incompletely imaged, the morphology of the liver suggests the possibility of cirrhotic change. Does the patient have clinical signs/symptoms of cirrhosis? Electronically Signed   By: Jacqulynn Cadet M.D.   On: 03/04/2017 12:31   US Scrotum  Result Date: 03/04/2017 CLINICAL DATA:  Scrotal pain and swelling. EXAM: SCROTAL ULTRASOUND DOPPLER ULTRASOUND OF THE TESTICLES TECHNIQUE: Complete ultrasound examination of the testicles, epididymis, and other scrotal structures was performed. Color and spectral Doppler ultrasound were also utilized to evaluate blood flow to the  testicles. COMPARISON:  No recent prior. FINDINGS: Right testicle Measurements: 3.4 x 2.0 x 2.5 cm. No mass or microlithiasis visualized. Left testicle Measurements: 3.7 x 2.4 x 2.8 cm. 0.6 x 0.3 x 0.4 small area of increased echogenicity noted in the left testicle. Although this may represent a focal area of fat or a at left testicular tumor cannot be completely excluded. No microlithiasis visualized . Right epididymis:  Tail of the epididymis is prominent. Left epididymis: Prominent epididymis with heterogeneous echotexture. 7 mm epididymal cyst. Hydrocele:  None visualized. Varicocele: Could not perform Valsalva maneuver due to patient's clinical condition. Bilateral scrotal skin thickening. Pulsed Doppler interrogation of both testes demonstrates normal low resistance arterial and venous waveforms bilaterally. IMPRESSION: 1. Cannot exclude bilateral epididymitis. 2. Small 0.6 cm area of increased echogenicity noted in the left testicle. Although this may represent small area of fat a small left testicular tumor cannot be excluded no evidence of torsion. Electronically Signed   By: Marcello Moores  Register   On: 03/04/2017 11:32   Ct Abdomen Pelvis W Contrast  Result Date: 03/06/2017 CLINICAL DATA:  Scrotal pain and swelling for about a week. Question abscess. EXAM: CT ABDOMEN AND PELVIS WITH CONTRAST TECHNIQUE: Multidetector CT imaging of the abdomen and pelvis was performed using the standard protocol following bolus administration of intravenous contrast. CONTRAST:  163mL ISOVUE-300 IOPAMIDOL (ISOVUE-300) INJECTION 61% COMPARISON:  Ultrasound exam from 03/04/2017. FINDINGS: Lower chest: Dependent atelectasis in the lower lobes. Hepatobiliary: The liver shows diffusely decreased attenuation suggesting steatosis. Gallbladder decompressed. No intrahepatic or extrahepatic biliary dilation. Pancreas: No focal mass lesion. No dilatation of the main duct. No intraparenchymal cyst. No peripancreatic edema. Spleen: No  splenomegaly. No focal mass lesion. Adrenals/Urinary Tract: Bilateral adrenal thickening without nodule or mass. 14 mm hyperattenuating lesion is identified in the interpolar right kidney (image 45 series 9). No suspicious mass lesion in the left kidney. No evidence for hydroureter. Bladder wall appears thickened although bladder is decompressed. Stomach/Bowel: Stomach is nondistended. No gastric wall thickening. No evidence of outlet obstruction. Duodenum is normally positioned as is the ligament of Treitz. No small bowel wall thickening. No small bowel dilatation. The terminal ileum is normal. The appendix is not visualized, but there is no edema or inflammation in the region of the cecum. Diverticular changes are noted in the left colon without evidence of diverticulitis. Vascular/Lymphatic: There is abdominal aortic atherosclerosis without aneurysm. 13 mm short axis hepatoduodenal ligament lymph node is borderline to mildly enlarged. No retroperitoneal lymphadenopathy. No pelvic sidewall lymphadenopathy. Reproductive: Prostate gland unremarkable. Marked skin thickening and  subcutaneous edema noted in the penis and scrotum. No focal or rim enhancing fluid collection is identified to suggest abscess. No evidence for soft tissue gas in the tissues of the perineum. Other: No intraperitoneal free fluid. Musculoskeletal: Bone windows reveal no worrisome lytic or sclerotic osseous lesions. IMPRESSION: 1. Marked skin thickening and subcutaneous edema in the soft tissues of the penis and scrotum. No focal or rim enhancing fluid collection to suggest abscess. No gas in the scrotum or perineum to suggest Fournier's gangrene. 2. 14 mm hyperattenuating lesion interpolar right kidney could represent neoplasm. MRI without and with contrast recommended to further evaluate. 3. 13 mm short axis hepatoduodenal ligament lymph node upper normal to borderline increased but nonspecific. 4. Bilateral lower lobe atelectasis. 5.   Abdominal Aortic Atherosclerois (ICD10-170.0) 6. Fatty liver with asymmetric enlargement of left hepatic lobe. No contour nodularity to suggest overt cirrhosis. Electronically Signed   By: Misty Stanley M.D.   On: 03/06/2017 16:26   [place] Art/ven Flow Abd Pelv Doppler  Result Date: 03/04/2017 CLINICAL DATA:  Scrotal pain and swelling. EXAM: SCROTAL ULTRASOUND DOPPLER ULTRASOUND OF THE TESTICLES TECHNIQUE: Complete ultrasound examination of the testicles, epididymis, and other scrotal structures was performed. Color and spectral Doppler ultrasound were also utilized to evaluate blood flow to the testicles. COMPARISON:  No recent prior. FINDINGS: Right testicle Measurements: 3.4 x 2.0 x 2.5 cm. No mass or microlithiasis visualized. Left testicle Measurements: 3.7 x 2.4 x 2.8 cm. 0.6 x 0.3 x 0.4 small area of increased echogenicity noted in the left testicle. Although this may represent a focal area of fat or a at left testicular tumor cannot be completely excluded. No microlithiasis visualized . Right epididymis:  Tail of the epididymis is prominent. Left epididymis: Prominent epididymis with heterogeneous echotexture. 7 mm epididymal cyst. Hydrocele:  None visualized. Varicocele: Could not perform Valsalva maneuver due to patient's clinical condition. Bilateral scrotal skin thickening. Pulsed Doppler interrogation of both testes demonstrates normal low resistance arterial and venous waveforms bilaterally. IMPRESSION: 1. Cannot exclude bilateral epididymitis. 2. Small 0.6 cm area of increased echogenicity noted in the left testicle. Although this may represent small area of fat a small left testicular tumor cannot be excluded no evidence of torsion. Electronically Signed   By: Marcello Moores  Register   On: 03/04/2017 11:32   Dg Chest Port 1 View  Result Date: 03/07/2017 CLINICAL DATA:  Dialysis, shortness of breath EXAM: PORTABLE CHEST 1 VIEW COMPARISON:  CT chest 03/04/2017 FINDINGS: There is no focal parenchymal  opacity. There is no pleural effusion or pneumothorax. There is stable cardiomegaly. The osseous structures are unremarkable. IMPRESSION: No active disease. Electronically Signed   By: Kathreen Devoid   On: 03/07/2017 09:24    Lab Data:  CBC:  Recent Labs Lab 03/04/17 0930  03/06/17 0701 03/07/17 0555 03/08/17 0259 03/09/17 0717 03/10/17 0542  WBC 23.2*  < > 18.1* 14.2* 16.9* 19.9* 23.1*  NEUTROABS 18.7*  --  14.6*  --   --  14.9* 16.2*  HGB 11.2*  < > 9.7* 10.1* 9.7* 10.1* 9.9*  HCT 34.8*  < > 30.4* 31.3* 30.6* 31.6* 30.7*  MCV 78.2  < > 75.8* 75.1* 75.6* 76.0* 75.8*  PLT 140*  < > 126* 132* 166 191 249  < > = values in this interval not displayed. Basic Metabolic Panel:  Recent Labs Lab 03/06/17 0701 03/07/17 0555 03/08/17 0259 03/09/17 0717 03/10/17 0542  NA 128* 128* 133* 130* 129*  K 4.2 4.8 3.9 3.4* 3.8  CL  89* 91* 94* 92* 92*  CO2 22 20* 24 23 21*  GLUCOSE 275* 336* 115* 220* 84  BUN 57* 76* 48* 41* 53*  CREATININE 13.56* 15.04* 10.61* 10.36* 12.52*  CALCIUM 9.0 8.6* 8.3* 9.1 8.8*  PHOS 7.7* 8.7* 5.6* 5.2* 6.6*   GFR: Estimated Creatinine Clearance: 9.7 mL/min (A) (by C-G formula based on SCr of 12.52 mg/dL (H)). Liver Function Tests:  Recent Labs Lab 03/04/17 0930  03/06/17 0701 03/07/17 0555 03/08/17 0259 03/09/17 0717 03/10/17 0542  AST 49*  --   --   --   --   --   --   ALT 24  --   --   --   --   --   --   ALKPHOS 125  --   --   --   --   --   --   BILITOT 0.6  --   --   --   --   --   --   PROT 8.2*  --   --   --   --   --   --   ALBUMIN 2.9*  < > 2.1* 2.0* 1.9* 2.0* 2.0*  < > = values in this interval not displayed. No results for input(s): LIPASE, AMYLASE in the last 168 hours. No results for input(s): AMMONIA in the last 168 hours. Coagulation Profile:  Recent Labs Lab 03/04/17 0930  INR 1.21   Cardiac Enzymes: No results for input(s): CKTOTAL, CKMB, CKMBINDEX, TROPONINI in the last 168 hours. BNP (last 3 results) No results for  input(s): PROBNP in the last 8760 hours. HbA1C: No results for input(s): HGBA1C in the last 72 hours. CBG:  Recent Labs Lab 03/08/17 2212 03/09/17 0803 03/09/17 1205 03/09/17 2052 03/10/17 1308  GLUCAP 255* 244* 120* 134* 234*   Lipid Profile: No results for input(s): CHOL, HDL, LDLCALC, TRIG, CHOLHDL, LDLDIRECT in the last 72 hours. Thyroid Function Tests: No results for input(s): TSH, T4TOTAL, FREET4, T3FREE, THYROIDAB in the last 72 hours. Anemia Panel: No results for input(s): VITAMINB12, FOLATE, FERRITIN, TIBC, IRON, RETICCTPCT in the last 72 hours. Urine analysis:    Component Value Date/Time   COLORURINE YELLOW 01/10/2012 1404   APPEARANCEUR CLEAR 01/10/2012 1404   LABSPEC 1.023 01/10/2012 1404   PHURINE 6.0 01/10/2012 1404   GLUCOSEU >1000 (A) 01/10/2012 1404   HGBUR TRACE (A) 01/10/2012 1404   BILIRUBINUR NEGATIVE 01/10/2012 1404   KETONESUR NEGATIVE 01/10/2012 1404   PROTEINUR >300 (A) 01/10/2012 1404   UROBILINOGEN 1.0 01/10/2012 1404   NITRITE NEGATIVE 01/10/2012 1404   LEUKOCYTESUR NEGATIVE 01/10/2012 1404     Liesa Tsan M.D. [facility] 03/10/2017, 2:38 PM  Pager: [phone] Between 7am to 7pm - call Pager - [phone]  After 7pm go to [email or web address] - password [email or web address]  Call night coverage person covering after 7pm

## 2017-03-11 ENCOUNTER — Encounter (HOSPITAL_COMMUNITY)
Admission: EM | Disposition: A | Discharge status: Home / Self Care | Payer: Self-pay | Source: Home / Self Care | Attending: Internal Medicine

## 2017-03-11 ENCOUNTER — Encounter (HOSPITAL_COMMUNITY): Payer: Self-pay | Admitting: Certified Registered Nurse Anesthetist

## 2017-03-11 ENCOUNTER — Inpatient Hospital Stay (HOSPITAL_COMMUNITY): Payer: Medicare Other | Admitting: Certified Registered Nurse Anesthetist

## 2017-03-11 HISTORY — PX: SCROTAL EXPLORATION: SHX2386

## 2017-03-11 LAB — RENAL FUNCTION PANEL
Albumin: 2.1 g/dL — ABNORMAL LOW (ref 3.5–5.0)
Anion gap: 15 (ref 5–15)
BUN: 38 mg/dL — ABNORMAL HIGH (ref 6–20)
CHLORIDE: 94 mmol/L — AB (ref 101–111)
CO2: 22 mmol/L (ref 22–32)
CREATININE: 10.28 mg/dL — AB (ref 0.61–1.24)
Calcium: 8.8 mg/dL — ABNORMAL LOW (ref 8.9–10.3)
GFR calc non Af Amer: 5 mL/min — ABNORMAL LOW (ref >60)
GFR, EST AFRICAN AMERICAN: 6 mL/min — AB (ref >60)
GLUCOSE: 189 mg/dL — AB (ref 65–99)
Phosphorus: 5.4 mg/dL — ABNORMAL HIGH (ref 2.5–4.6)
Potassium: 3.4 mmol/L — ABNORMAL LOW (ref 3.5–5.1)
Sodium: 131 mmol/L — ABNORMAL LOW (ref 135–145)

## 2017-03-11 LAB — GLUCOSE, CAPILLARY
GLUCOSE-CAPILLARY: 179 mg/dL — AB (ref 65–99)
GLUCOSE-CAPILLARY: 138 mg/dL — AB (ref 65–99)
Glucose-Capillary: 178 mg/dL — ABNORMAL HIGH (ref 65–99)
Glucose-Capillary: 264 mg/dL — ABNORMAL HIGH (ref 65–99)

## 2017-03-11 SURGERY — EXPLORATION, SCROTUM
Anesthesia: General | Site: Scrotum

## 2017-03-11 MED ORDER — CALCITRIOL 0.5 MCG PO CAPS
ORAL_CAPSULE | ORAL | Status: AC
Start: 2017-03-11 — End: 2017-03-11
  Filled 2017-03-11: qty 7

## 2017-03-11 MED ORDER — ACETAMINOPHEN 500 MG PO TABS: 1000.0000 mg | ORAL_TABLET | Freq: Four times a day (QID) | ORAL | Status: DC | PRN

## 2017-03-11 MED ORDER — EPHEDRINE 5 MG/ML INJ
INTRAVENOUS | Status: AC
  Filled 2017-03-11: qty 10

## 2017-03-11 MED ORDER — LIDOCAINE 2% (20 MG/ML) 5 ML SYRINGE
INTRAMUSCULAR | Status: AC
  Filled 2017-03-11: qty 5

## 2017-03-11 MED ORDER — VANCOMYCIN HCL IN DEXTROSE 1-5 GM/200ML-% IV SOLN
INTRAVENOUS | Status: AC
  Administered 2017-03-11: 1000 mg via INTRAVENOUS
  Filled 2017-03-11: qty 200

## 2017-03-11 MED ORDER — LIDOCAINE HCL (CARDIAC) 20 MG/ML IV SOLN
INTRAVENOUS | Status: DC | PRN
  Administered 2017-03-11: 60 mg via INTRAVENOUS

## 2017-03-11 MED ORDER — HYDROCODONE-ACETAMINOPHEN 5-325 MG PO TABS
1.0000 | ORAL_TABLET | ORAL | Status: DC | PRN
  Administered 2017-03-12: 2 via ORAL
  Administered 2017-03-12 (×2): 1 via ORAL
  Administered 2017-03-13: 2 via ORAL
  Administered 2017-03-13: 1 via ORAL
  Administered 2017-03-14: 2 via ORAL
  Filled 2017-03-11: qty 2
  Filled 2017-03-11: qty 1
  Filled 2017-03-11 (×2): qty 2
  Filled 2017-03-11 (×2): qty 1

## 2017-03-11 MED ORDER — MIDAZOLAM HCL 5 MG/5ML IJ SOLN
INTRAMUSCULAR | Status: DC | PRN
  Administered 2017-03-11: 1 mg via INTRAVENOUS

## 2017-03-11 MED ORDER — CALCITRIOL 0.5 MCG PO CAPS
3.5000 ug | ORAL_CAPSULE | ORAL | Status: DC
  Administered 2017-03-11 – 2017-03-14 (×2): 3.5 ug via ORAL
  Filled 2017-03-11: qty 7

## 2017-03-11 MED ORDER — PHENYLEPHRINE 40 MCG/ML (10ML) SYRINGE FOR IV PUSH (FOR BLOOD PRESSURE SUPPORT)
PREFILLED_SYRINGE | INTRAVENOUS | Status: AC
  Filled 2017-03-11: qty 10

## 2017-03-11 MED ORDER — EPHEDRINE SULFATE 50 MG/ML IJ SOLN
INTRAMUSCULAR | Status: DC | PRN
  Administered 2017-03-11: 5 mg via INTRAVENOUS

## 2017-03-11 MED ORDER — PROPOFOL 10 MG/ML IV BOLUS
INTRAVENOUS | Status: DC | PRN
  Administered 2017-03-11: 100 mg via INTRAVENOUS
  Administered 2017-03-11: 200 mg via INTRAVENOUS

## 2017-03-11 MED ORDER — MORPHINE SULFATE (PF) 2 MG/ML IV SOLN
2.0000 mg | Freq: Once | INTRAVENOUS | Status: AC
  Administered 2017-03-11: 2 mg via INTRAVENOUS
  Filled 2017-03-11: qty 1

## 2017-03-11 MED ORDER — PHENYLEPHRINE HCL 10 MG/ML IJ SOLN
INTRAMUSCULAR | Status: DC | PRN
  Administered 2017-03-11 (×3): 120 ug via INTRAVENOUS
  Administered 2017-03-11: 80 ug via INTRAVENOUS

## 2017-03-11 MED ORDER — SUCCINYLCHOLINE CHLORIDE 200 MG/10ML IV SOSY
PREFILLED_SYRINGE | INTRAVENOUS | Status: AC
  Filled 2017-03-11: qty 10

## 2017-03-11 MED ORDER — CEFAZOLIN SODIUM-DEXTROSE 2-4 GM/100ML-% IV SOLN
2.0000 g | INTRAVENOUS | Status: DC
  Administered 2017-03-11 – 2017-03-14 (×2): 2 g via INTRAVENOUS
  Filled 2017-03-11 (×2): qty 100

## 2017-03-11 MED ORDER — SODIUM CHLORIDE 0.9 % IV SOLN
INTRAVENOUS | Status: DC
  Administered 2017-03-11: 11:00:00 via INTRAVENOUS

## 2017-03-11 MED ORDER — FENTANYL CITRATE (PF) 100 MCG/2ML IJ SOLN
INTRAMUSCULAR | Status: DC | PRN
  Administered 2017-03-11: 125 ug via INTRAVENOUS
  Administered 2017-03-11: 25 ug via INTRAVENOUS
  Administered 2017-03-11: 50 ug via INTRAVENOUS
  Administered 2017-03-11 (×2): 25 ug via INTRAVENOUS

## 2017-03-11 MED ORDER — OXYCODONE HCL 5 MG/5ML PO SOLN: 5.0000 mg | Freq: Once | ORAL | Status: DC | PRN

## 2017-03-11 MED ORDER — MIDAZOLAM HCL 2 MG/2ML IJ SOLN
INTRAMUSCULAR | Status: AC
  Filled 2017-03-11: qty 2

## 2017-03-11 MED ORDER — FENTANYL CITRATE (PF) 100 MCG/2ML IJ SOLN: 25.0000 ug | INTRAMUSCULAR | Status: DC | PRN

## 2017-03-11 MED ORDER — SUCCINYLCHOLINE CHLORIDE 200 MG/10ML IV SOSY
PREFILLED_SYRINGE | INTRAVENOUS | Status: DC | PRN
Start: 2017-03-11 — End: 2017-03-11
  Administered 2017-03-11: 120 mg via INTRAVENOUS

## 2017-03-11 MED ORDER — FENTANYL CITRATE (PF) 250 MCG/5ML IJ SOLN
INTRAMUSCULAR | Status: AC
  Filled 2017-03-11: qty 5

## 2017-03-11 MED ORDER — VANCOMYCIN HCL IN DEXTROSE 1-5 GM/200ML-% IV SOLN
1000.0000 mg | INTRAVENOUS | Status: DC
  Administered 2017-03-11: 1000 mg via INTRAVENOUS
  Filled 2017-03-11: qty 200

## 2017-03-11 MED ORDER — ONDANSETRON HCL 4 MG/2ML IJ SOLN
INTRAMUSCULAR | Status: AC
  Filled 2017-03-11: qty 2

## 2017-03-11 MED ORDER — PHENYLEPHRINE HCL 10 MG/ML IJ SOLN
INTRAMUSCULAR | Status: DC | PRN
  Administered 2017-03-11: 50 ug/min via INTRAVENOUS

## 2017-03-11 MED ORDER — OXYCODONE HCL 5 MG PO TABS: 5.0000 mg | ORAL_TABLET | Freq: Once | ORAL | Status: DC | PRN

## 2017-03-11 MED ORDER — PROPOFOL 10 MG/ML IV BOLUS
INTRAVENOUS | Status: AC
  Filled 2017-03-11: qty 40

## 2017-03-11 MED ORDER — ONDANSETRON HCL 4 MG/2ML IJ SOLN: 4.0000 mg | Freq: Four times a day (QID) | INTRAMUSCULAR | Status: DC | PRN

## 2017-03-11 SURGICAL SUPPLY — 28 items
BAG URO CATCHER STRL LF (MISCELLANEOUS) IMPLANT
BLADE 10 SAFETY STRL DISP (BLADE) ×2 IMPLANT
BLADE SURG 15 STRL LF DISP TIS (BLADE) ×2 IMPLANT
BLADE SURG 15 STRL SS (BLADE) ×2
BNDG GAUZE ELAST 4 BULKY (GAUZE/BANDAGES/DRESSINGS) ×2 IMPLANT
CANISTER SUCT 3000ML PPV (MISCELLANEOUS) ×2 IMPLANT
CONT SPEC 4OZ CLIKSEAL STRL BL (MISCELLANEOUS) ×2 IMPLANT
DRAPE LAPAROTOMY T 102X78X121 (DRAPES) ×2 IMPLANT
DRSG PAD ABDOMINAL 8X10 ST (GAUZE/BANDAGES/DRESSINGS) ×4 IMPLANT
ELECT REM PT RETURN 9FT ADLT (ELECTROSURGICAL) ×2
ELECTRODE REM PT RTRN 9FT ADLT (ELECTROSURGICAL) ×1 IMPLANT
GAUZE PACKING IODOFORM 2 (PACKING) ×2 IMPLANT
GAUZE SPONGE 4X4 12PLY STRL (GAUZE/BANDAGES/DRESSINGS) ×2 IMPLANT
GLOVE SURG ORTHO 8.0 STRL STRW (GLOVE) ×2 IMPLANT
KIT BASIN OR (CUSTOM PROCEDURE TRAY) ×2 IMPLANT
KIT ROOM TURNOVER OR (KITS) ×2 IMPLANT
NS IRRIG 1000ML POUR BTL (IV SOLUTION) ×2 IMPLANT
PACK GENERAL/GYN (CUSTOM PROCEDURE TRAY) ×2 IMPLANT
PAD ARMBOARD 7.5X6 YLW CONV (MISCELLANEOUS) ×4 IMPLANT
SOL PREP POV-IOD 4OZ 10% (MISCELLANEOUS) ×2 IMPLANT
SPONGE LAP 18X18 X RAY DECT (DISPOSABLE) IMPLANT
SUT CHROMIC 3 0 SH 27 (SUTURE) ×2 IMPLANT
SUT PROLENE 4 0 PS 2 18 (SUTURE) ×2 IMPLANT
SUT VICRYL 4-0 PS2 18IN ABS (SUTURE) ×2 IMPLANT
SWAB COLLECTION DEVICE MRSA (MISCELLANEOUS) ×2 IMPLANT
TOWEL OR 17X24 6PK STRL BLUE (TOWEL DISPOSABLE) ×2 IMPLANT
TOWEL OR 17X26 10 PK STRL BLUE (TOWEL DISPOSABLE) ×2 IMPLANT
WATER STERILE IRR 1000ML POUR (IV SOLUTION) ×2 IMPLANT

## 2017-03-11 NOTE — Progress Notes (Signed)
  [facility] Progress Note   Subjective:  Seen in room. No CP or dyspnea. Febrile again overnight. Sched for scrotal I&D today and then HD afterwards. Edema is slowly improving.   Objective Vitals:   03/10/17 2100 03/11/17 0017 03/11/17 0433 03/11/17 0902  BP: (!) 131/51  (!) 143/71 122/68  Pulse: 83  84 84  Resp:   20 18  Temp: (!) 101.5 F (38.6 C) 99.5 F (37.5 C) 99 F (37.2 C) 98.9 F (37.2 C)  TempSrc: Oral Oral Oral Oral  SpO2: 94%  99% 93%  Weight: (!) 155.6 kg (343 lb)     Height:       Physical Exam General: Well appearing man, NAD. Heart: RRR; no murmur. Lungs: CTAB; no rales Extremities: trace LE edema Dialysis Access: R forearm AVF + thrill  Additional Objective Labs: Basic Metabolic Panel:  Recent Labs Lab 03/09/17 0717 03/10/17 0542 03/11/17 0631  NA 130* 129* 131*  K 3.4* 3.8 3.4*  CL 92* 92* 94*  CO2 23 21* 22  GLUCOSE 220* 84 189*  BUN 41* 52* 38*  CREATININE 10.36* 12.52* 10.28*  CALCIUM 9.1 8.8* 8.8*  PHOS 5.2* 6.6* 5.4*   Liver Function Tests:  Recent Labs Lab 03/09/17 0717 03/10/17 0542 03/11/17 0631  ALBUMIN 2.0* 2.0* 2.1*   CBC:  Recent Labs Lab 03/06/17 0701 03/07/17 0555 03/08/17 0259 03/09/17 0717 03/10/17 0542  WBC 18.1* 14.2* 16.9* 19.9* 23.1*  NEUTROABS 14.6*  --   --  14.9* 16.2*  HGB 9.7* 10.1* 9.7* 10.1* 9.9*  HCT 30.4* 31.3* 30.6* 31.6* 30.7*  MCV 75.8* 75.1* 75.6* 76.0* 75.8*  PLT 126* 132* 166 191 249   CBG:  Recent Labs Lab 03/09/17 2052 03/10/17 1308 03/10/17 1624 03/10/17 2136 03/11/17 0817  GLUCAP 134* 234* 214* 91 178*   Medications:  . allopurinol  100 mg Oral Daily  . aspirin EC  81 mg Oral Daily  . calcitRIOL  3.5 mcg Oral Q M,W,F-HD  . cinacalcet  60 mg Oral Q breakfast  . darbepoetin (ARANESP) injection - DIALYSIS  40 mcg Intravenous Q Mon-HD  . ferric citrate  630 mg Oral TID WC  . gabapentin  200 mg Oral BID  . insulin aspart  0-9 Units Subcutaneous TID WC  .  insulin aspart  15 Units Subcutaneous TID WC  . insulin detemir  25 Units Subcutaneous BID  . pravastatin  40 mg Oral QHS    Dialysis Orders: [facility]WF [facility]  4hr 54min  EDW 156 kg 200 dialyzer  2k/ 2ca bath    - Heparin 10,000 bolus , 2000 units intermit.    - Calcitrol 3.5 mcg q hd   - Venofer 50 mg q weekly   Assessment/Plan: 1. Hypotension/fever/scrotal and penile pain with swelling: + Proteus bacteremia (1/2 blood culture) but negative urine cultures. S/p I&D/debridement of necrotic area over the posterior scrotum 4/25, plan for repeat I&D 4/27. On IV Cefazolin, needs 2 week course of abx with HD. 2. ESRD: Continue MWF schedule (Wed HD changed to [place] this week d/t cannulation difficulties). No heparin s/p procedure. 3. Anemia: Hgb 9.9, continue ESA . No indications for transfusion. 4. CKD-MBD Phos better, continue Auryxia as binder. Continue Sensipar and calcitriol for PTH suppression. 5. Hypertension: BP fine, continue to UF as tolerated. 6. DM: On insulin, per primary.  Veneta Penton, PA-C 03/11/2017, 9:37 AM  [facility] Pager: [phone]

## 2017-03-11 NOTE — Progress Notes (Signed)
[facility]                                                                              Patient Demographics  Lenny Fiumara, is a 61 y.o. male, DOB - [birth year], ENI:[id number]  Admit date - 03/04/2017   Admitting Physician Debbe Odea, MD  Outpatient Primary MD for the patient is Gara Kroner, MD  Outpatient specialists:   LOS - 7  days    Chief Complaint  Patient presents with  . Shortness of Breath       Brief summary   Patient is a 61 year old male with ESRD on HD, MGUS, Gout, Hep C (recently treated), morbid obesity, sleep apnea who presents from dialysis for sudden shortness of breath and hypotension during the dialysis. Due to severe dyspnea, patient was sent to the hospital from dialysis center. Patient's wife reported that he was also having vomiting and diarrhea for 3 days prior to admission, patient reported his scrotum had been swollen for a few days. Patient noted to have a Proteus bacteremia likely seeded from the urine. Patient empirically on IV antibiotics. ID and urology following, patient underwent I & D on scrotum 03/09/2017.   Assessment & Plan    Principal Problem:  acute respiratory failure with hypoxia - Questionable etiology, morbidly obese patient with hypotension, elevated lactic acid level, sepsis, Proteus bacteremia, likely from scrotal abscess.  -  Patient  reported improvement with shortness of breath after hemodialysis. Influenza PCR is negative.  - Patient with some difficulty in keeping CPAP on through the night. ABG essentially normal.  - Blood cultures 1/2 positive for Proteus species. Also concerning for epididymitis.  - CT angiogram chest negative for PE.  -  2-D echo with normal EF 55-60% and grade 2 diastolic dysfunction .Patient has been seen in consultation by pulmonary. Patient has been changed to IV Ancef.  Active problems Proteus bacteremia/Sepsis, Scrotal abscess - Blood cultures 1/2 positive for Proteus.  Likely may have seeded from the urine, presented with fevers and hypotension at the time of admission. -CT abdomen and pelvis showed subcutaneous edema in the soft tissues of the penis and scrotum however no focal or rim enhancing fluid collection to suggest abscess or any Fournier's gangrene  - ID was consulted and changed IV Rocephin to IV Ancef and recommended two-week course of IV antibiotics with hemodialysis.    hypertension BP currently stable, Holding antihypertensive medications.  scrotal abscess - Urology consulted, underwent I&D on 4/25, blood cultures positive for Proteus  - CT abdomen and pelvis negative for any abscess or Fournier's gangrene  - Undergoing I&D again today, follow cultures  ESRD on HD MWF - Nephrology consulted, hemodialysis per schedule   diabetes mellitus Hemoglobin A1c 13.  -  Continue Levemir, meal coverage, sliding scale insulin   hyperlipidemia Continue statin.  gout Continue allopurinol.   morbid obesity -BMI 46.7, Patient counseled on diet and weight control  obstructive sleep apnea Patient has been advised that it's in his best interest to use CPAP at bedtime - continue CPAP QHS   hepatitis C Treated.   Code Status: Full CODE STATUS DVT Prophylaxis: Heparin subcutaneous Family  Communication: Discussed in detail with the patient, all imaging results, lab results explained to the patient  Disposition Plan: When cleared by urology and nephrology  Time Spent in minutes   25 minutes  Procedures:    CT angiogram chest 03/04/2017  Chest x-ray 03/04/2017  Ultrasound of the scrotum 03/04/2017  2-D echo 03/07/2017   I&D on 4/25  I&D on 4/27    Consultants:   Nephrology Urology Pulmonary Infectious disease  Antimicrobials:   IV vancomycin 03/04/2017>>>>>> 03/06/2017  IV Zosyn 03/04/2017>>>> 03/05/2017  IV Rocephin 03/05/2017>>>>> 03/07/2017  IV Ancef 03/07/2017    Medications  Scheduled Meds: . [MAR  Hold] allopurinol  100 mg Oral Daily  . [MAR Hold] aspirin EC  81 mg Oral Daily  . [MAR Hold] calcitRIOL  3.5 mcg Oral Q M,W,F-HD  . [MAR Hold] cinacalcet  60 mg Oral Q breakfast  . [MAR Hold] darbepoetin (ARANESP) injection - DIALYSIS  40 mcg Intravenous Q Mon-HD  . [MAR Hold] ferric citrate  630 mg Oral TID WC  . [MAR Hold] gabapentin  200 mg Oral BID  . [MAR Hold] insulin aspart  0-9 Units Subcutaneous TID WC  . [MAR Hold] insulin aspart  15 Units Subcutaneous TID WC  . [MAR Hold] insulin detemir  25 Units Subcutaneous BID  . [MAR Hold] pravastatin  40 mg Oral QHS   Continuous Infusions: . sodium chloride 10 mL/hr at 03/11/17 1115   PRN Meds:.[MAR Hold] acetaminophen **OR** [MAR Hold] acetaminophen, [MAR Hold] calcium carbonate (dosed in mg elemental calcium), [MAR Hold] camphor-menthol **AND** [MAR Hold] hydrOXYzine, [MAR Hold] chlorproMAZINE, [MAR Hold] docusate sodium, [MAR Hold] feeding supplement (NEPRO CARB STEADY), [MAR Hold] ondansetron **OR** [MAR Hold] ondansetron (ZOFRAN) IV, [MAR Hold] sorbitol, [MAR Hold] zolpidem   Antibiotics   Anti-infectives    Start     Dose/Rate Route Frequency Ordered Stop   03/11/17 1200  vancomycin (VANCOCIN) IVPB 1000 mg/200 mL premix  Status:  Discontinued     1,000 mg 200 mL/hr over 60 Minutes Intravenous Every M-W-F (Hemodialysis) 03/09/17 0935 03/10/17 1348   03/10/17 1430  ceFAZolin (ANCEF) IVPB 2g/100 mL premix     2 g 200 mL/hr over 30 Minutes Intravenous  Once 03/10/17 1342 03/10/17 1505   03/10/17 1430  vancomycin (VANCOCIN) IVPB 1000 mg/200 mL premix     1,000 mg 200 mL/hr over 60 Minutes Intravenous  Once 03/10/17 1345 03/10/17 1617   03/09/17 2000  ceFAZolin (ANCEF) IVPB 2g/100 mL premix  Status:  Discontinued     2 g 200 mL/hr over 30 Minutes Intravenous Every M-W-F (2000) 03/08/17 1118 03/09/17 0935   03/09/17 1800  ceFAZolin (ANCEF) IVPB 2g/100 mL premix  Status:  Discontinued     2 g 200 mL/hr over 30 Minutes Intravenous  Every M-W-F (Hemodialysis) 03/07/17 1759 03/08/17 1118   03/09/17 1530  vancomycin (VANCOCIN) 1,500 mg in sodium chloride 0.9 % 500 mL IVPB     1,500 mg 250 mL/hr over 120 Minutes Intravenous  Once 03/09/17 1445 03/09/17 2048   03/09/17 1200  vancomycin (VANCOCIN) 1,500 mg in sodium chloride 0.9 % 500 mL IVPB  Status:  Discontinued     1,500 mg 250 mL/hr over 120 Minutes Intravenous Every Wed (Hemodialysis) 03/09/17 1011 03/09/17 1445   03/08/17 2000  ceFAZolin (ANCEF) IVPB 2g/100 mL premix     2 g 200 mL/hr over 30 Minutes Intravenous  Once 03/08/17 1118 03/08/17 2114   03/07/17 1830  ceFAZolin (ANCEF) IVPB 2g/100 mL premix     2  g 200 mL/hr over 30 Minutes Intravenous  Once 03/07/17 1758 03/07/17 2056   03/05/17 1800  vancomycin (VANCOCIN) IVPB 750 mg/150 ml premix     750 mg 150 mL/hr over 60 Minutes Intravenous Every T-Th-Sa (Hemodialysis) 03/05/17 1341 03/05/17 2020   03/05/17 1500  cefTRIAXone (ROCEPHIN) 2 g in dextrose 5 % 50 mL IVPB  Status:  Discontinued     2 g 100 mL/hr over 30 Minutes Intravenous Every 24 hours 03/05/17 1427 03/07/17 1753   03/04/17 2200  piperacillin-tazobactam (ZOSYN) IVPB 3.375 g  Status:  Discontinued     3.375 g 12.5 mL/hr over 240 Minutes Intravenous Every 12 hours 03/04/17 1318 03/05/17 1427   03/04/17 1030  vancomycin (VANCOCIN) 2,500 mg in sodium chloride 0.9 % 500 mL IVPB     2,500 mg 250 mL/hr over 120 Minutes Intravenous  Once 03/04/17 1016 03/04/17 1339   03/04/17 1015  piperacillin-tazobactam (ZOSYN) IVPB 3.375 g     3.375 g 100 mL/hr over 30 Minutes Intravenous  Once 03/04/17 1007 03/04/17 1114   03/04/17 1015  vancomycin (VANCOCIN) IVPB 1000 mg/200 mL premix  Status:  Discontinued     1,000 mg 200 mL/hr over 60 Minutes Intravenous  Once 03/04/17 1007 03/04/17 1015        Subjective:   Ladarion Munyon was seen and examined today.Seen in the morning, no complaints, awaiting surgery. Patient denies dizziness, chest pain, shortness of  breath, abdominal pain, N/V/D/C, new weakness, numbess, tingling. He spiked fever overnight 101.67F  Objective:   Vitals:   03/10/17 2100 03/11/17 0017 03/11/17 0433 03/11/17 0902  BP: (!) 131/51  (!) 143/71 122/68  Pulse: 83  84 84  Resp:   20 18  Temp: (!) 101.5 F (38.6 C) 99.5 F (37.5 C) 99 F (37.2 C) 98.9 F (37.2 C)  TempSrc: Oral Oral Oral Oral  SpO2: 94%  99% 93%  Weight: (!) 155.6 kg (343 lb)     Height:        Intake/Output Summary (Last 24 hours) at 03/11/17 1302 Last data filed at 03/11/17 0903  Gross per 24 hour  Intake             1020 ml  Output              901 ml  Net              119 ml     Wt Readings from Last 3 Encounters:  03/10/17 (!) 155.6 kg (343 lb)  02/03/17 (!) 154.2 kg (340 lb)  01/24/17 (!) 158.8 kg (350 lb)     Exam  General: Alert and oriented x 3, NAD  HEENT:    Neck: Supple, no JVD, no masses  Cardiovascular: S1 S2 auscultated, no rubs, murmurs or gallops. Regular rate and rhythm.  Respiratory: Clear to auscultation bilaterally, no wheezing, rales or rhonchi  Gastrointestinal: Soft, nontender, nondistended, + bowel sounds  Ext: no cyanosis clubbing or edema  Neuro: No new deficits  Skin: No rashes  Psych: Normal affect and demeanor, alert and oriented x3   Genitourinary: Persistent scrotal and penile swelling   Data Reviewed:  I have personally reviewed following labs and imaging studies  Micro Results Recent Results (from the past 240 hour(s))  Culture, blood (routine x 2)     Status: None   Collection Time: 03/04/17  9:27 AM  Result Value Ref Range Status   Specimen Description BLOOD LEFT ANTECUBITAL  Final   Special Requests   Final  BOTTLES DRAWN AEROBIC AND ANAEROBIC Blood Culture adequate volume   Culture   Final    NO GROWTH 5 DAYS Performed at [facility], [place] [address], [place], [place] [address]    Report Status 03/09/2017 FINAL  Final  Culture, blood (routine x 2)     Status: Abnormal     Collection Time: 03/04/17 10:31 AM  Result Value Ref Range Status   Specimen Description BLOOD LEFT HAND  Final   Special Requests   Final    IN PEDIATRIC BOTTLE Blood Culture results may not be optimal due to an excessive volume of blood received in culture bottles   Culture  Setup Time   Final    GRAM NEGATIVE RODS IN PEDIATRIC BOTTLE CRITICAL RESULT CALLED TO, READ BACK BY AND VERIFIED WITH: R RUMBARGER 03/05/17 @ 0914 M VESTAL    Culture PROTEUS MIRABILIS (A)  Final   Report Status 03/07/2017 FINAL  Final   Organism ID, Bacteria PROTEUS MIRABILIS  Final      Susceptibility   Proteus mirabilis - MIC*    AMPICILLIN <=2 SENSITIVE Sensitive     CEFAZOLIN <=4 SENSITIVE Sensitive     CEFEPIME <=1 SENSITIVE Sensitive     CEFTAZIDIME <=1 SENSITIVE Sensitive     CEFTRIAXONE <=1 SENSITIVE Sensitive     CIPROFLOXACIN <=0.25 SENSITIVE Sensitive     GENTAMICIN <=1 SENSITIVE Sensitive     IMIPENEM 4 SENSITIVE Sensitive     TRIMETH/SULFA <=20 SENSITIVE Sensitive     AMPICILLIN/SULBACTAM <=2 SENSITIVE Sensitive     PIP/TAZO <=4 SENSITIVE Sensitive     * PROTEUS MIRABILIS  Blood Culture ID Panel (Reflexed)     Status: Abnormal   Collection Time: 03/04/17 10:31 AM  Result Value Ref Range Status   Enterococcus species NOT DETECTED NOT DETECTED Final   Listeria monocytogenes NOT DETECTED NOT DETECTED Final   Staphylococcus species NOT DETECTED NOT DETECTED Final   Staphylococcus aureus NOT DETECTED NOT DETECTED Final   Streptococcus species NOT DETECTED NOT DETECTED Final   Streptococcus agalactiae NOT DETECTED NOT DETECTED Final   Streptococcus pneumoniae NOT DETECTED NOT DETECTED Final   Streptococcus pyogenes NOT DETECTED NOT DETECTED Final   Acinetobacter baumannii NOT DETECTED NOT DETECTED Final   Enterobacteriaceae species DETECTED (A) NOT DETECTED Final    Comment: Enterobacteriaceae represent a large family of gram-negative bacteria, not a single organism. CRITICAL RESULT CALLED  TO, READ BACK BY AND VERIFIED WITH: R RUMBARGER,PHARMD AT 0914 03/05/17 BY L BENFIELD    Enterobacter cloacae complex NOT DETECTED NOT DETECTED Final   Escherichia coli NOT DETECTED NOT DETECTED Final   Klebsiella oxytoca NOT DETECTED NOT DETECTED Final   Klebsiella pneumoniae NOT DETECTED NOT DETECTED Final   Proteus species DETECTED (A) NOT DETECTED Final    Comment: CRITICAL RESULT CALLED TO, READ BACK BY AND VERIFIED WITH: R RUMBARGER,PHARMD AT 0914 03/05/17 BY M VESTAL    Serratia marcescens NOT DETECTED NOT DETECTED Final   Carbapenem resistance NOT DETECTED NOT DETECTED Final   Haemophilus influenzae NOT DETECTED NOT DETECTED Final   Neisseria meningitidis NOT DETECTED NOT DETECTED Final   Pseudomonas aeruginosa NOT DETECTED NOT DETECTED Final   Candida albicans NOT DETECTED NOT DETECTED Final   Candida glabrata NOT DETECTED NOT DETECTED Final   Candida krusei NOT DETECTED NOT DETECTED Final   Candida parapsilosis NOT DETECTED NOT DETECTED Final   Candida tropicalis NOT DETECTED NOT DETECTED Final    Comment: Performed at [facility], [place]  [address], [place], [place] [address]  Respiratory Panel by PCR     Status: None   Collection Time: 03/04/17  2:56 PM  Result Value Ref Range Status   Adenovirus NOT DETECTED NOT DETECTED Final   Coronavirus 229E NOT DETECTED NOT DETECTED Final   Coronavirus HKU1 NOT DETECTED NOT DETECTED Final   Coronavirus NL63 NOT DETECTED NOT DETECTED Final   Coronavirus OC43 NOT DETECTED NOT DETECTED Final   Metapneumovirus NOT DETECTED NOT DETECTED Final   Rhinovirus / Enterovirus NOT DETECTED NOT DETECTED Final   Influenza A NOT DETECTED NOT DETECTED Final   Influenza B NOT DETECTED NOT DETECTED Final   Parainfluenza Virus 1 NOT DETECTED NOT DETECTED Final   Parainfluenza Virus 2 NOT DETECTED NOT DETECTED Final   Parainfluenza Virus 3 NOT DETECTED NOT DETECTED Final   Parainfluenza Virus 4 NOT DETECTED NOT DETECTED Final   Respiratory  Syncytial Virus NOT DETECTED NOT DETECTED Final   Bordetella pertussis NOT DETECTED NOT DETECTED Final   Chlamydophila pneumoniae NOT DETECTED NOT DETECTED Final   Mycoplasma pneumoniae NOT DETECTED NOT DETECTED Final    Comment: Performed at [facility], [place] [address], [place], [place] [address]  MRSA PCR Screening     Status: None   Collection Time: 03/04/17  9:50 PM  Result Value Ref Range Status   MRSA by PCR NEGATIVE NEGATIVE Final    Comment:        The GeneXpert MRSA Assay (FDA approved for NASAL specimens only), is one component of a comprehensive MRSA colonization surveillance program. It is not intended to diagnose MRSA infection nor to guide or monitor treatment for MRSA infections.   Urine culture     Status: None   Collection Time: 03/07/17  1:10 PM  Result Value Ref Range Status   Specimen Description URINE, CATHETERIZED  Final   Special Requests NONE  Final   Culture NO GROWTH  Final   Report Status 03/08/2017 FINAL  Final  Surgical pcr screen     Status: None   Collection Time: 03/10/17  3:34 PM  Result Value Ref Range Status   MRSA, PCR NEGATIVE NEGATIVE Final   Staphylococcus aureus NEGATIVE NEGATIVE Final    Comment:        The Xpert SA Assay (FDA approved for NASAL specimens in patients over 36 years of age), is one component of a comprehensive surveillance program.  Test performance has been validated by [facility] for patients greater than or equal to 15 year old. It is not intended to diagnose infection nor to guide or monitor treatment.     Radiology Reports Dg Chest 2 View  Result Date: 03/04/2017 CLINICAL DATA:  Increased shortness of breath and decreased O2 saturations. EXAM: CHEST  2 VIEW COMPARISON:  01/07/2014 FINDINGS: Azygos shadow and central vascular structures are mildly prominent. Findings could be related to vascular congestion. There is no frank pulmonary edema. Heart size is within limits. No pleural effusions. No acute  bone abnormality. Negative for a pneumothorax. IMPRESSION: Probable vascular congestion without overt pulmonary edema. Electronically Signed   By: Markus Daft M.D.   On: 03/04/2017 09:25   Ct Angio Chest Pe W/cm &/or Wo Cm  Result Date: 03/04/2017 CLINICAL DATA:  60 year old male with increased shortness of breath and hypoxia following dialysis earlier today. EXAM: CT ANGIOGRAPHY CHEST WITH CONTRAST TECHNIQUE: Multidetector CT imaging of the chest was performed using the standard protocol during bolus administration of intravenous contrast. Multiplanar CT image reconstructions and MIPs  were obtained to evaluate the vascular anatomy. CONTRAST:  100 mL Isovue 370 COMPARISON:  Chest x-ray 03/04/2017; prior CT scan of the chest 07/09/2014 FINDINGS: Cardiovascular: Limited opacification of the pulmonary arteries secondary to transient physiologic interruption of contrast. No central or proximal or lobar PE. No evidence of aortic dissection or aneurysm. Aberrant right subclavian artery noticed incidentally. The heart is enlarged. No pericardial effusion. Mediastinum/Nodes: Unremarkable CT appearance of the thyroid gland. No suspicious mediastinal or hilar adenopathy. No soft tissue mediastinal mass. The thoracic esophagus is unremarkable. Lungs/Pleura: Respiratory motion artifact limits evaluation for small pulmonary nodules. No focal airspace consolidation, pleural effusion, pneumothorax or pulmonary edema. Mild dependent atelectasis present in both lower lobes. Upper Abdomen: Although the liver is incompletely evaluated, the left hepatic lobe and caudate lobe appear relatively hypertrophied which may suggest early cirrhotic change. No acute abnormalities within the upper abdomen. Musculoskeletal: No acute fracture or aggressive appearing lytic or blastic osseous lesion. Review of the MIP images confirms the above findings. IMPRESSION: 1. No evidence of acute pulmonary embolus to the level of the proximal lobar  arteries. Evaluation of the more distal pulmonary arterial tree is nondiagnostic secondary to a combination of relatively poor vessel opacification and respiratory motion artifact. 2. No evidence of pneumonia or other acute cardiopulmonary process. 3. Bibasilar subsegmental atelectasis. 4. Aberrant right subclavian artery. 5. Although incompletely imaged, the morphology of the liver suggests the possibility of cirrhotic change. Does the patient have clinical signs/symptoms of cirrhosis? Electronically Signed   By: Jacqulynn Cadet M.D.   On: 03/04/2017 12:31   US Scrotum  Result Date: 03/04/2017 CLINICAL DATA:  Scrotal pain and swelling. EXAM: SCROTAL ULTRASOUND DOPPLER ULTRASOUND OF THE TESTICLES TECHNIQUE: Complete ultrasound examination of the testicles, epididymis, and other scrotal structures was performed. Color and spectral Doppler ultrasound were also utilized to evaluate blood flow to the testicles. COMPARISON:  No recent prior. FINDINGS: Right testicle Measurements: 3.4 x 2.0 x 2.5 cm. No mass or microlithiasis visualized. Left testicle Measurements: 3.7 x 2.4 x 2.8 cm. 0.6 x 0.3 x 0.4 small area of increased echogenicity noted in the left testicle. Although this may represent a focal area of fat or a at left testicular tumor cannot be completely excluded. No microlithiasis visualized . Right epididymis:  Tail of the epididymis is prominent. Left epididymis: Prominent epididymis with heterogeneous echotexture. 7 mm epididymal cyst. Hydrocele:  None visualized. Varicocele: Could not perform Valsalva maneuver due to patient's clinical condition. Bilateral scrotal skin thickening. Pulsed Doppler interrogation of both testes demonstrates normal low resistance arterial and venous waveforms bilaterally. IMPRESSION: 1. Cannot exclude bilateral epididymitis. 2. Small 0.6 cm area of increased echogenicity noted in the left testicle. Although this may represent small area of fat a small left testicular tumor  cannot be excluded no evidence of torsion. Electronically Signed   By: Marcello Moores  Register   On: 03/04/2017 11:32   Ct Abdomen Pelvis W Contrast  Result Date: 03/06/2017 CLINICAL DATA:  Scrotal pain and swelling for about a week. Question abscess. EXAM: CT ABDOMEN AND PELVIS WITH CONTRAST TECHNIQUE: Multidetector CT imaging of the abdomen and pelvis was performed using the standard protocol following bolus administration of intravenous contrast. CONTRAST:  124mL ISOVUE-300 IOPAMIDOL (ISOVUE-300) INJECTION 61% COMPARISON:  Ultrasound exam from 03/04/2017. FINDINGS: Lower chest: Dependent atelectasis in the lower lobes. Hepatobiliary: The liver shows diffusely decreased attenuation suggesting steatosis. Gallbladder decompressed. No intrahepatic or extrahepatic biliary dilation. Pancreas: No focal mass lesion. No dilatation of the main duct. No intraparenchymal cyst. No  peripancreatic edema. Spleen: No splenomegaly. No focal mass lesion. Adrenals/Urinary Tract: Bilateral adrenal thickening without nodule or mass. 14 mm hyperattenuating lesion is identified in the interpolar right kidney (image 45 series 9). No suspicious mass lesion in the left kidney. No evidence for hydroureter. Bladder wall appears thickened although bladder is decompressed. Stomach/Bowel: Stomach is nondistended. No gastric wall thickening. No evidence of outlet obstruction. Duodenum is normally positioned as is the ligament of Treitz. No small bowel wall thickening. No small bowel dilatation. The terminal ileum is normal. The appendix is not visualized, but there is no edema or inflammation in the region of the cecum. Diverticular changes are noted in the left colon without evidence of diverticulitis. Vascular/Lymphatic: There is abdominal aortic atherosclerosis without aneurysm. 13 mm short axis hepatoduodenal ligament lymph node is borderline to mildly enlarged. No retroperitoneal lymphadenopathy. No pelvic sidewall lymphadenopathy.  Reproductive: Prostate gland unremarkable. Marked skin thickening and subcutaneous edema noted in the penis and scrotum. No focal or rim enhancing fluid collection is identified to suggest abscess. No evidence for soft tissue gas in the tissues of the perineum. Other: No intraperitoneal free fluid. Musculoskeletal: Bone windows reveal no worrisome lytic or sclerotic osseous lesions. IMPRESSION: 1. Marked skin thickening and subcutaneous edema in the soft tissues of the penis and scrotum. No focal or rim enhancing fluid collection to suggest abscess. No gas in the scrotum or perineum to suggest Fournier's gangrene. 2. 14 mm hyperattenuating lesion interpolar right kidney could represent neoplasm. MRI without and with contrast recommended to further evaluate. 3. 13 mm short axis hepatoduodenal ligament lymph node upper normal to borderline increased but nonspecific. 4. Bilateral lower lobe atelectasis. 5.  Abdominal Aortic Atherosclerois (ICD10-170.0) 6. Fatty liver with asymmetric enlargement of left hepatic lobe. No contour nodularity to suggest overt cirrhosis. Electronically Signed   By: Misty Stanley M.D.   On: 03/06/2017 16:26   [place] Art/ven Flow Abd Pelv Doppler  Result Date: 03/04/2017 CLINICAL DATA:  Scrotal pain and swelling. EXAM: SCROTAL ULTRASOUND DOPPLER ULTRASOUND OF THE TESTICLES TECHNIQUE: Complete ultrasound examination of the testicles, epididymis, and other scrotal structures was performed. Color and spectral Doppler ultrasound were also utilized to evaluate blood flow to the testicles. COMPARISON:  No recent prior. FINDINGS: Right testicle Measurements: 3.4 x 2.0 x 2.5 cm. No mass or microlithiasis visualized. Left testicle Measurements: 3.7 x 2.4 x 2.8 cm. 0.6 x 0.3 x 0.4 small area of increased echogenicity noted in the left testicle. Although this may represent a focal area of fat or a at left testicular tumor cannot be completely excluded. No microlithiasis visualized . Right epididymis:   Tail of the epididymis is prominent. Left epididymis: Prominent epididymis with heterogeneous echotexture. 7 mm epididymal cyst. Hydrocele:  None visualized. Varicocele: Could not perform Valsalva maneuver due to patient's clinical condition. Bilateral scrotal skin thickening. Pulsed Doppler interrogation of both testes demonstrates normal low resistance arterial and venous waveforms bilaterally. IMPRESSION: 1. Cannot exclude bilateral epididymitis. 2. Small 0.6 cm area of increased echogenicity noted in the left testicle. Although this may represent small area of fat a small left testicular tumor cannot be excluded no evidence of torsion. Electronically Signed   By: Marcello Moores  Register   On: 03/04/2017 11:32   Dg Chest Port 1 View  Result Date: 03/07/2017 CLINICAL DATA:  Dialysis, shortness of breath EXAM: PORTABLE CHEST 1 VIEW COMPARISON:  CT chest 03/04/2017 FINDINGS: There is no focal parenchymal opacity. There is no pleural effusion or pneumothorax. There is stable cardiomegaly. The osseous structures  are unremarkable. IMPRESSION: No active disease. Electronically Signed   By: Kathreen Devoid   On: 03/07/2017 09:24    Lab Data:  CBC:  Recent Labs Lab 03/06/17 0701 03/07/17 0555 03/08/17 0259 03/09/17 0717 03/10/17 0542  WBC 18.1* 14.2* 16.9* 19.9* 23.1*  NEUTROABS 14.6*  --   --  14.9* 16.2*  HGB 9.7* 10.1* 9.7* 10.1* 9.9*  HCT 30.4* 31.3* 30.6* 31.6* 30.7*  MCV 75.8* 75.1* 75.6* 76.0* 75.8*  PLT 126* 132* 166 191 103   Basic Metabolic Panel:  Recent Labs Lab 03/07/17 0555 03/08/17 0259 03/09/17 0717 03/10/17 0542 03/11/17 0631  NA 128* 133* 130* 129* 131*  K 4.8 3.9 3.4* 3.8 3.4*  CL 91* 94* 92* 92* 94*  CO2 20* 24 23 21* 22  GLUCOSE 336* 115* 220* 84 189*  BUN 76* 48* 41* 52* 38*  CREATININE 15.04* 10.61* 10.36* 12.52* 10.28*  CALCIUM 8.6* 8.3* 9.1 8.8* 8.8*  PHOS 8.7* 5.6* 5.2* 6.6* 5.4*   GFR: Estimated Creatinine Clearance: 11.9 mL/min (A) (by C-G formula based on SCr  of 10.28 mg/dL (H)). Liver Function Tests:  Recent Labs Lab 03/07/17 0555 03/08/17 0259 03/09/17 0717 03/10/17 0542 03/11/17 0631  ALBUMIN 2.0* 1.9* 2.0* 2.0* 2.1*   No results for input(s): LIPASE, AMYLASE in the last 168 hours. No results for input(s): AMMONIA in the last 168 hours. Coagulation Profile: No results for input(s): INR, PROTIME in the last 168 hours. Cardiac Enzymes: No results for input(s): CKTOTAL, CKMB, CKMBINDEX, TROPONINI in the last 168 hours. BNP (last 3 results) No results for input(s): PROBNP in the last 8760 hours. HbA1C: No results for input(s): HGBA1C in the last 72 hours. CBG:  Recent Labs Lab 03/10/17 1308 03/10/17 1624 03/10/17 2136 03/11/17 0817 03/11/17 1046  GLUCAP 234* 214* 91 178* 179*   Lipid Profile: No results for input(s): CHOL, HDL, LDLCALC, TRIG, CHOLHDL, LDLDIRECT in the last 72 hours. Thyroid Function Tests: No results for input(s): TSH, T4TOTAL, FREET4, T3FREE, THYROIDAB in the last 72 hours. Anemia Panel: No results for input(s): VITAMINB12, FOLATE, FERRITIN, TIBC, IRON, RETICCTPCT in the last 72 hours. Urine analysis:    Component Value Date/Time   COLORURINE YELLOW 01/10/2012 1404   APPEARANCEUR CLEAR 01/10/2012 1404   LABSPEC 1.023 01/10/2012 1404   PHURINE 6.0 01/10/2012 1404   GLUCOSEU >1000 (A) 01/10/2012 1404   HGBUR TRACE (A) 01/10/2012 1404   BILIRUBINUR NEGATIVE 01/10/2012 1404   KETONESUR NEGATIVE 01/10/2012 1404   PROTEINUR >300 (A) 01/10/2012 1404   UROBILINOGEN 1.0 01/10/2012 1404   NITRITE NEGATIVE 01/10/2012 1404   LEUKOCYTESUR NEGATIVE 01/10/2012 1404     Beth Spackman M.D. [facility] 03/11/2017, 1:02 PM  Pager: [phone] Between 7am to 7pm - call Pager - [phone]  After 7pm go to [email or web address] - password [email or web address]  Call night coverage person covering after 7pm

## 2017-03-11 NOTE — Progress Notes (Signed)
Patient back from [facility].Patient requests for pain medicine aside from tylenol. MD on call text paged. Order received. [name], Wonda Cheng, Therapist, sports

## 2017-03-11 NOTE — Anesthesia Procedure Notes (Signed)
Procedure Name: Intubation Date/Time: 03/11/2017 12:06 PM Performed by: Candis Shine Pre-anesthesia Checklist: Patient identified, Emergency Drugs available, Suction available and Patient being monitored Patient Re-evaluated:Patient Re-evaluated prior to inductionOxygen Delivery Method: Circle System Utilized Preoxygenation: Pre-oxygenation with 100% oxygen Intubation Type: IV induction and Rapid sequence Laryngoscope Size: Mac and 4 Grade View: Grade I Tube type: Oral Tube size: 7.5 mm Number of attempts: 1 Airway Equipment and Method: Stylet Placement Confirmation: ETT inserted through vocal cords under direct vision,  positive ETCO2 and breath sounds checked- equal and bilateral Secured at: 22 cm Tube secured with: Tape Dental Injury: Teeth and Oropharynx as per pre-operative assessment

## 2017-03-11 NOTE — Progress Notes (Signed)
Spoke with patient and wife regarding A1C=13%.  They state that he will be following up with Dr. Criss Rosales next week.  Explained that A1C of 13% means that blood sugars are averaging in the 300's.  Gave patient and wife handout on A1C results and standards of care.  They verbalized understanding.  He states he has no questions/needs at this time.  Will follow.  Thanks, Adah Perl, RN, BC-ADM Inpatient Diabetes Coordinator Pager [phone] (8a-5p)

## 2017-03-11 NOTE — Transfer of Care (Signed)
Immediate Anesthesia Transfer of Care Note  Patient: [name]  Procedure(s) Performed: Procedure(s): SCROTUM EXPLORATION AND DEBRIDEMENT (N/A)  Patient Location: PACU  Anesthesia Type:General  Level of Consciousness: awake, alert  and oriented  Airway & Oxygen Therapy: Patient Spontanous Breathing and Patient connected to face mask oxygen  Post-op Assessment: Report given to RN and Post -op Vital signs reviewed and stable  Post vital signs: Reviewed and stable  Last Vitals:  Vitals:   03/11/17 0902 03/11/17 1324  BP: 122/68   Pulse: 84 85  Resp: 18 (!) 22  Temp: 37.2 C (P) 36.9 C    Last Pain:  Vitals:   03/11/17 1324  TempSrc:   PainSc: (P) 0-No pain      Patients Stated Pain Goal: 0 (87/19/94 1290)  Complications: No apparent anesthesia complications

## 2017-03-11 NOTE — Care Management Important Message (Signed)
Important Message  Patient Details  Name: [name] MRN: [id number] Date of Birth: February 07, 1956   Medicare Important Message Given:  Yes    Nathen May 03/11/2017, 2:39 PM

## 2017-03-11 NOTE — Procedures (Signed)
Patient seen on Hemodialysis. QB 300, UF goal 3L Treatment adjusted as needed.  Elmarie Shiley MD [facility]. Office # [phone] Pager # [phone] 3:20 PM

## 2017-03-11 NOTE — Op Note (Signed)
Preoperative diagnosis: Possible scrotal abscess versus scrotal necrosis in diabetic hemodialysis patient.  Postoperative diagnosis: Scrotal abscess, left upper scrotum with necrosis of posterior scrotal skin.  Procedure: Scrotal exploration, incision and drainage of scrotal abscess, debridement of posterior scrotal skin.  Surgeon: Diona Fanti  Anesthesia: Gen.  Complications: None  Specimen: Culture of scrotal wound, to microbiology.  Estimated blood loss: Less than 100 mL  Indications: 61 year old obese diabetic male on hemodialysis, presenting about a week ago with scrotal swelling.  He was admitted for fever and diarrhea.  Incidental complaints of scrotal swelling were given by the patient.  At that time, scrotal ultrasound revealed normal testicles, without evidence of abscess.  Physical examination by me at the time of consultation revealed erythema and edema of the entire scrotum, but no evidence of crepitus or necrosis of the scrotal skin.  The patient was put on broad-spectrum antibiotics and admitted for close follow-up.  He has grown Proteus from his blood culture, but the patient had urine drawn from his bladder which was free of any bacteria.  He has been on broad-spectrum antibiotics, but has had no significant resolution of the scrotal process.  2 days ago he had debridement of a small necrotic area in the posterior scrotal area.  There was a fair amount of drainage after this.  Because of the patient's persistent scrotal process, he is taken the operating room at this point for debridement of his posterior scrotum as well as scrotal exploration and possible debridement of any devitalized scrotal skin.  The patient was informed of the seriousness of his process, being diabetic, and the fact that he may end up having significant debridement performed.  He is agreeable to this.  Description of procedure: The patient was properly identified in the holding area.  He has been on  broad-spectrum antibiotic management.  He was taken to the operating room where general anesthesia was administered.  He is placed in the dorsolithotomy position.  Genitalia perineum and lower abdomen were prepped and draped.  Proper timeout was performed.  Careful inspection was made of the patient's penis, suprapubic area, as well as scrotum.  There was no evident fluctuance or erythema of the suprapubic area.  There was significant persistent edema of the patient's foreskin.  Most of the scrotal skin looked viable, but there was fibrinous exudate with necrotic tissue within the posterior scrotal wound.  His wound was probed, and the cavity tracked up laterally and superiorly along the left scrotum.  I probed deep into this area, and purulent fluid returned.  I cultured this.  The scrotal wound was debrided, and there was normal/bleeding tissue.  A proximally 3 millimeters lateral to the fibrinous/necrotic tissue.  All necrotic tissue was removed with the Bovie.  Skin edges were a letter coagulated to produce hemostasis.  I then tracked up superiorly with my finger, and made a counterincision high in the left lateral scrotum.  This was opened up for a couple of centimeters.  I was unable to track up a little bit higher into his infrapubic region, and to the base of his penis on the left side.  This cavity incorporated these areas.  These areas were then irrigated.  No further tracking could be found with vigorous palpation of the abscess cavity.  The entire right hemiscrotum was intact.  I then placed 2 inch iodoform gauze into the superior and inferior scrotal wounds.  At this point, no necrotic tissue was remaining.  Hemostasis was excellent.  A large gauze dressing  was then placed on the patient's scrotum, and held in place with mesh underpants.  No gross tissue was sent for pathology.  The patient was awakened and taken to PACU in stable condition.  He tolerated the procedure well.

## 2017-03-11 NOTE — Anesthesia Preprocedure Evaluation (Signed)
Anesthesia Evaluation  Patient identified by MRN, date of birth, ID band Patient awake    Reviewed: Allergy & Precautions, H&P , NPO status , Patient's Chart, lab work & pertinent test results  Airway Mallampati: II   Neck ROM: full    Dental   Pulmonary sleep apnea , former smoker,    breath sounds clear to auscultation       Cardiovascular hypertension, +CHF   Rhythm:regular Rate:Normal     Neuro/Psych    GI/Hepatic (+)     substance abuse  alcohol use,   Endo/Other  diabetes, Type 2Morbid obesity  Renal/GU ESRF and DialysisRenal disease     Musculoskeletal   Abdominal   Peds  Hematology  (+) Blood dyscrasia, anemia ,   Anesthesia Other Findings   Reproductive/Obstetrics                             Anesthesia Physical Anesthesia Plan  ASA: IV  Anesthesia Plan: General   Post-op Pain Management:    Induction: Intravenous  Airway Management Planned: Oral ETT  Additional Equipment:   Intra-op Plan:   Post-operative Plan: Extubation in OR  Informed Consent: I have reviewed the patients History and Physical, chart, labs and discussed the procedure including the risks, benefits and alternatives for the proposed anesthesia with the patient or authorized representative who has indicated his/her understanding and acceptance.     Plan Discussed with: CRNA, Anesthesiologist and Surgeon  Anesthesia Plan Comments:         Anesthesia Quick Evaluation

## 2017-03-12 LAB — RENAL FUNCTION PANEL
Albumin: 2 g/dL — ABNORMAL LOW (ref 3.5–5.0)
Anion gap: 12 (ref 5–15)
BUN: 25 mg/dL — AB (ref 6–20)
CALCIUM: 8.3 mg/dL — AB (ref 8.9–10.3)
CO2: 24 mmol/L (ref 22–32)
CREATININE: 8.26 mg/dL — AB (ref 0.61–1.24)
Chloride: 95 mmol/L — ABNORMAL LOW (ref 101–111)
GFR, EST AFRICAN AMERICAN: 7 mL/min — AB (ref >60)
GFR, EST NON AFRICAN AMERICAN: 6 mL/min — AB (ref >60)
Glucose, Bld: 211 mg/dL — ABNORMAL HIGH (ref 65–99)
Phosphorus: 5.1 mg/dL — ABNORMAL HIGH (ref 2.5–4.6)
Potassium: 3.5 mmol/L (ref 3.5–5.1)
SODIUM: 131 mmol/L — AB (ref 135–145)

## 2017-03-12 LAB — GLUCOSE, CAPILLARY
GLUCOSE-CAPILLARY: 203 mg/dL — AB (ref 65–99)
GLUCOSE-CAPILLARY: 169 mg/dL — AB (ref 65–99)
GLUCOSE-CAPILLARY: 86 mg/dL (ref 65–99)
Glucose-Capillary: 169 mg/dL — ABNORMAL HIGH (ref 65–99)

## 2017-03-12 MED ORDER — SODIUM CHLORIDE 0.9 % IV BOLUS (SEPSIS)
250.0000 mL | Freq: Once | INTRAVENOUS | Status: AC
  Administered 2017-03-12: 250 mL via INTRAVENOUS

## 2017-03-12 MED ORDER — INSULIN DETEMIR 100 UNIT/ML ~~LOC~~ SOLN
20.0000 [IU] | Freq: Two times a day (BID) | SUBCUTANEOUS | Status: DC
  Administered 2017-03-12 – 2017-03-14 (×4): 20 [IU] via SUBCUTANEOUS
  Filled 2017-03-12 (×5): qty 0.2

## 2017-03-12 MED ORDER — INSULIN ASPART 100 UNIT/ML ~~LOC~~ SOLN
10.0000 [IU] | Freq: Three times a day (TID) | SUBCUTANEOUS | Status: DC
  Administered 2017-03-13 – 2017-03-14 (×3): 10 [IU] via SUBCUTANEOUS

## 2017-03-12 NOTE — Progress Notes (Signed)
[facility] Progress Note   Subjective:   Seen in room. Denies CP or dyspnea. He did have cannulation issues again yesterday with his R AVF, fistulogram was ordered to assess the arm.  Objective Vitals:   03/11/17 1900 03/11/17 1915 03/11/17 2020 03/12/17 0450  BP: (!) 113/50 (!) 107/58 (!) 118/58 (!) 95/46  Pulse: 79 77 82 80  Resp:  (!) 24 20 20   Temp:  97.6 F (36.4 C) 98.1 F (36.7 C) 98.4 F (36.9 C)  TempSrc:  Oral Oral Oral  SpO2:  96% 97% 97%  Weight:  (!) 153.2 kg (337 lb 11.9 oz)  (!) 153.3 kg (337 lb 15.4 oz)  Height:       Physical Exam General: Well appearing man, NAD. Heart: RRR; no murmur. Lungs: CTAB; no rales Extremities: trace LE edema Dialysis Access: R forearm AVF + thrill  Additional Objective Labs: Basic Metabolic Panel:  Recent Labs Lab 03/10/17 0542 03/11/17 0631 03/12/17 0342  NA 129* 131* 131*  K 3.8 3.4* 3.5  CL 92* 94* 95*  CO2 21* 22 24  GLUCOSE 84 189* 211*  BUN 52* 38* 25*  CREATININE 12.52* 10.28* 8.26*  CALCIUM 8.8* 8.8* 8.3*  PHOS 6.6* 5.4* 5.1*   Liver Function Tests:  Recent Labs Lab 03/10/17 0542 03/11/17 0631 03/12/17 0342  ALBUMIN 2.0* 2.1* 2.0*   CBC:  Recent Labs Lab 03/06/17 0701 03/07/17 0555 03/08/17 0259 03/09/17 0717 03/10/17 0542  WBC 18.1* 14.2* 16.9* 19.9* 23.1*  NEUTROABS 14.6*  --   --  14.9* 16.2*  HGB 9.7* 10.1* 9.7* 10.1* 9.9*  HCT 30.4* 31.3* 30.6* 31.6* 30.7*  MCV 75.8* 75.1* 75.6* 76.0* 75.8*  PLT 126* 132* 166 191 249   CBG:  Recent Labs Lab 03/11/17 0817 03/11/17 1046 03/11/17 1326 03/11/17 2126 03/12/17 0823  GLUCAP 178* 179* 138* 264* 203*   Medications: . sodium chloride 10 mL/hr at 03/11/17 1115  .  ceFAZolin (ANCEF) IV Stopped (03/11/17 2145)  . vancomycin Stopped (03/11/17 2035)   . allopurinol  100 mg Oral Daily  . aspirin EC  81 mg Oral Daily  . calcitRIOL  3.5 mcg Oral Q M,W,F-HD  . cinacalcet  60 mg Oral Q breakfast  . darbepoetin (ARANESP)  injection - DIALYSIS  40 mcg Intravenous Q Mon-HD  . ferric citrate  630 mg Oral TID WC  . gabapentin  200 mg Oral BID  . insulin aspart  0-9 Units Subcutaneous TID WC  . insulin aspart  15 Units Subcutaneous TID WC  . insulin detemir  25 Units Subcutaneous BID  . pravastatin  40 mg Oral QHS    Dialysis Orders: MWF [facility] 4hr 12min EDW 156 kg 200 dialyzer 2k/ 2ca bath  - Heparin 10,000 bolus , 2000 units intermit.  - Calcitrol 3.5 mcg q hd  - Venofer 50 mg q weekly   Assessment/Plan: 1. Scrotal abscess/sepsis: + Proteus bacteremia (1/2 blood culture) but negative urine cultures. S/p I&D/debridement of necrotic area over the posterior scrotum 4/25 and 4/27. WBC rising. On IV Cefazolin, needs 2 week course of abx with HD. 2. ESRD: Continue MWF schedule, next due 4/30. EDW will be changed on d/c, now ~153kg. 3. Anemia: Hgb 9.9, continue ESA . No indications for transfusion. 4. CKD-MBDPhos better, continue Auryxia as binder. Continue Sensipar and calcitriol for PTH suppression. 5. Hypertension: BP fine, continue to UF as tolerated. 6. DM: On insulin, per primary.  Veneta Penton, PA-C 03/12/2017, 8:48 AM  [facility] Pager: [phone]  

## 2017-03-12 NOTE — Progress Notes (Signed)
Pharmacy Antibiotic Note  [name] is a 61 y.o. male admitted on 03/04/2017 with sepsis and a possible scrotal infection, also found to have Proteus bacteremia 1/2 (UC negative).  Pharmacy has been consulted for cefazolin + vancomycin dosing (vancomycin added per per Renal with ongoing close eval by Urology with plans for I&D of suspicious area of scrotom).  ID recommends 2 weeks of antibiotics with HD. Had I&D on 4/25 without improvement, repeat I&D on 4/27 in OR.  Plan: Vancomycin 1g IV QHD-MWF Continue Ancef 2g IV QHD-MWF Monitor clinical progress, c/s Pre-HD vancomycin level on 4/30 Monitor HD schedule/tolerance   Height: 6' (182.9 cm) Weight: (!) 337 lb 15.4 oz (153.3 kg) IBW/kg (Calculated) : 77.6  Temp (24hrs), Avg:98 F (36.7 C), Min:97.4 F (36.3 C), Max:98.4 F (36.9 C)   Recent Labs Lab 03/06/17 0701 03/07/17 0555 03/08/17 0259 03/09/17 0717 03/10/17 0542 03/11/17 0631 03/12/17 0342  WBC 18.1* 14.2* 16.9* 19.9* 23.1*  --   --   CREATININE 13.56* 15.04* 10.61* 10.36* 12.52* 10.28* 8.26*  VANCORANDOM 37  --   --   --   --   --   --     Estimated Creatinine Clearance: 14.7 mL/min (A) (by C-G formula based on SCr of 8.26 mg/dL (H)).    Allergies  Allergen Reactions  . Penicillins Nausea And Vomiting and Other (See Comments)    Has patient had a PCN reaction causing immediate rash, facial/tongue/throat swelling, SOB or lightheadedness with hypotension: No Has patient had a PCN reaction causing severe rash involving mucus membranes or skin necrosis: No Has patient had a PCN reaction that required hospitalization No Has patient had a PCN reaction occurring within the last 10 years: No If all of the above answers are "NO", then may proceed with Cephalosporin use.  . Vioxx [Rofecoxib] Nausea And Vomiting   Antimicrobials this admission:  4/20 vancomycin>>4/22; 4/25>> 4/20 Zosyn>>4/21 4/21 CTX >> 4/23 4/23 Ancef >>  Antibiotic adjustments: 4/22 VR  56  Microbiology results:  4/20BCx: 1/2 proteus- pan sens 4/20 BCID: proteus species  4/20MRSA PCR: Negative  4/20 resp panel: neg 4/20, 4/26 MRSA PCR: neg 4/23: UCx: neg 4/27 Scrotal abscess: NGTD   Renold Genta, PharmD, BCPS Clinical Pharmacist Phone for today - [place] - [phone] 03/12/2017 10:36 AM

## 2017-03-12 NOTE — Progress Notes (Addendum)
[facility]                                                                              Patient Demographics  [name], is a 61 y.o. male, DOB - 02/03/1956, WSF:[id number]  Admit date - 03/04/2017   Admitting Physician Debbe Odea, MD  Outpatient Primary MD for the patient is Gara Kroner, MD  Outpatient specialists:   LOS - 8  days    Chief Complaint  Patient presents with  . Shortness of Breath       Brief summary   Patient is a 61 year old male with ESRD on HD, MGUS, Gout, Hep C (recently treated), morbid obesity, sleep apnea who presents from dialysis for sudden shortness of breath and hypotension during the dialysis. Due to severe dyspnea, patient was sent to the hospital from dialysis center. Patient's wife reported that he was also having vomiting and diarrhea for 3 days prior to admission, patient reported his scrotum had been swollen for a few days. Patient noted to have a Proteus bacteremia likely seeded from the urine. Patient empirically on IV antibiotics. ID and urology following, patient underwent I & D on scrotum 03/09/2017.   Assessment & Plan    Principal Problem:  acute respiratory failure with hypoxia - Questionable etiology, morbidly obese patient with hypotension, elevated lactic acid level, sepsis, Proteus bacteremia, likely from scrotal abscess.  -  Patient  reported improvement with shortness of breath after hemodialysis. Influenza PCR is negative.  - Patient with some difficulty in keeping CPAP on through the night. ABG essentially normal.  - Blood cultures 1/2 positive for Proteus species. Also concerning for epididymitis.  - CT angiogram chest negative for PE.  -  2-D echo with normal EF 55-60% and grade 2 diastolic dysfunction .Patient has been seen in consultation by pulmonary. Patient has been changed to IV Ancef.  Active problems Proteus bacteremia/Sepsis, Scrotal abscess - Blood cultures 1/2 positive for Proteus.  Likely may have seeded from the urine, presented with fevers and hypotension at the time of admission. -CT abdomen and pelvis showed subcutaneous edema in the soft tissues of the penis and scrotum however no focal or rim enhancing fluid collection to suggest abscess or any Fournier's gangrene  - ID was consulted and changed IV Rocephin to IV Ancef and recommended two-week course of IV antibiotics with hemodialysis.    hypertension Episode of hypotension after vomiting, gave IV fluid bolus of 2 50 mL, continue to hold antihypertensives  scrotal abscess - Urology consulted, underwent I&D on 4/25, blood cultures positive for Proteus  - CT abdomen and pelvis negative for any abscess or Fournier's gangrene  - Underwent I&D again on 4/27 , follow cultures  ESRD on HD MWF - Nephrology consulted, hemodialysis per schedule   diabetes mellitus Hemoglobin A1c 13.  -  Continue Levemir, meal coverage, sliding scale insulin   hyperlipidemia Continue statin.  gout Continue allopurinol.   morbid obesity -BMI 46.7, Patient counseled on diet and weight control  obstructive sleep apnea Patient has been advised that it's in his best interest to use CPAP at bedtime - continue CPAP QHS   hepatitis C  Treated.   Code Status: Full CODE STATUS DVT Prophylaxis: Heparin subcutaneous Family Communication: Discussed in detail with the patient, all imaging results, lab results explained to the patient  Disposition Plan: When cleared by urology and nephrology  Time Spent in minutes   25 minutes  Procedures:    CT angiogram chest 03/04/2017  Chest x-ray 03/04/2017  Ultrasound of the scrotum 03/04/2017  2-D echo 03/07/2017   I&D on 4/25  I&D on 4/27    Consultants:   Nephrology Urology Pulmonary Infectious disease  Antimicrobials:   IV vancomycin 03/04/2017>>>>>> 03/06/2017  IV Zosyn 03/04/2017>>>> 03/05/2017  IV Rocephin 03/05/2017>>>>> 03/07/2017  IV Ancef  03/07/2017    Medications  Scheduled Meds: . allopurinol  100 mg Oral Daily  . aspirin EC  81 mg Oral Daily  . calcitRIOL  3.5 mcg Oral Q M,W,F-HD  . cinacalcet  60 mg Oral Q breakfast  . darbepoetin (ARANESP) injection - DIALYSIS  40 mcg Intravenous Q Mon-HD  . ferric citrate  630 mg Oral TID WC  . gabapentin  200 mg Oral BID  . insulin aspart  0-9 Units Subcutaneous TID WC  . insulin aspart  15 Units Subcutaneous TID WC  . insulin detemir  25 Units Subcutaneous BID  . pravastatin  40 mg Oral QHS   Continuous Infusions: . sodium chloride 10 mL/hr at 03/11/17 1115  .  ceFAZolin (ANCEF) IV Stopped (03/11/17 2145)  . vancomycin Stopped (03/11/17 2035)   PRN Meds:.acetaminophen **OR** acetaminophen, calcium carbonate (dosed in mg elemental calcium), camphor-menthol **AND** hydrOXYzine, chlorproMAZINE, docusate sodium, feeding supplement (NEPRO CARB STEADY), HYDROcodone-acetaminophen, ondansetron **OR** ondansetron (ZOFRAN) IV, sorbitol, zolpidem   Antibiotics   Anti-infectives    Start     Dose/Rate Route Frequency Ordered Stop   03/11/17 1800  ceFAZolin (ANCEF) IVPB 2g/100 mL premix     2 g 200 mL/hr over 30 Minutes Intravenous Every M-W-F (1800) 03/11/17 1433     03/11/17 1530  vancomycin (VANCOCIN) IVPB 1000 mg/200 mL premix     1,000 mg 200 mL/hr over 60 Minutes Intravenous Every M-W-F (Hemodialysis) 03/11/17 1433     03/11/17 1200  vancomycin (VANCOCIN) IVPB 1000 mg/200 mL premix  Status:  Discontinued     1,000 mg 200 mL/hr over 60 Minutes Intravenous Every M-W-F (Hemodialysis) 03/09/17 0935 03/10/17 1348   03/10/17 1430  ceFAZolin (ANCEF) IVPB 2g/100 mL premix     2 g 200 mL/hr over 30 Minutes Intravenous  Once 03/10/17 1342 03/10/17 1505   03/10/17 1430  vancomycin (VANCOCIN) IVPB 1000 mg/200 mL premix     1,000 mg 200 mL/hr over 60 Minutes Intravenous  Once 03/10/17 1345 03/10/17 1617   03/09/17 2000  ceFAZolin (ANCEF) IVPB 2g/100 mL premix  Status:  Discontinued      2 g 200 mL/hr over 30 Minutes Intravenous Every M-W-F (2000) 03/08/17 1118 03/09/17 0935   03/09/17 1800  ceFAZolin (ANCEF) IVPB 2g/100 mL premix  Status:  Discontinued     2 g 200 mL/hr over 30 Minutes Intravenous Every M-W-F (Hemodialysis) 03/07/17 1759 03/08/17 1118   03/09/17 1530  vancomycin (VANCOCIN) 1,500 mg in sodium chloride 0.9 % 500 mL IVPB     1,500 mg 250 mL/hr over 120 Minutes Intravenous  Once 03/09/17 1445 03/09/17 2048   03/09/17 1200  vancomycin (VANCOCIN) 1,500 mg in sodium chloride 0.9 % 500 mL IVPB  Status:  Discontinued     1,500 mg 250 mL/hr over 120 Minutes Intravenous Every Wed (Hemodialysis) 03/09/17 1011 03/09/17 1445  03/08/17 2000  ceFAZolin (ANCEF) IVPB 2g/100 mL premix     2 g 200 mL/hr over 30 Minutes Intravenous  Once 03/08/17 1118 03/08/17 2114   03/07/17 1830  ceFAZolin (ANCEF) IVPB 2g/100 mL premix     2 g 200 mL/hr over 30 Minutes Intravenous  Once 03/07/17 1758 03/07/17 2056   03/05/17 1800  vancomycin (VANCOCIN) IVPB 750 mg/150 ml premix     750 mg 150 mL/hr over 60 Minutes Intravenous Every T-Th-Sa (Hemodialysis) 03/05/17 1341 03/05/17 2020   03/05/17 1500  cefTRIAXone (ROCEPHIN) 2 g in dextrose 5 % 50 mL IVPB  Status:  Discontinued     2 g 100 mL/hr over 30 Minutes Intravenous Every 24 hours 03/05/17 1427 03/07/17 1753   03/04/17 2200  piperacillin-tazobactam (ZOSYN) IVPB 3.375 g  Status:  Discontinued     3.375 g 12.5 mL/hr over 240 Minutes Intravenous Every 12 hours 03/04/17 1318 03/05/17 1427   03/04/17 1030  vancomycin (VANCOCIN) 2,500 mg in sodium chloride 0.9 % 500 mL IVPB     2,500 mg 250 mL/hr over 120 Minutes Intravenous  Once 03/04/17 1016 03/04/17 1339   03/04/17 1015  piperacillin-tazobactam (ZOSYN) IVPB 3.375 g     3.375 g 100 mL/hr over 30 Minutes Intravenous  Once 03/04/17 1007 03/04/17 1114   03/04/17 1015  vancomycin (VANCOCIN) IVPB 1000 mg/200 mL premix  Status:  Discontinued     1,000 mg 200 mL/hr over 60 Minutes  Intravenous  Once 03/04/17 1007 03/04/17 1015        Subjective:   Boone Gear was seen and examined today. Seen in the morning, no complaints, Subsequently had feeling of nausea and episode of vomiting with hypotension received IV fluid bolus of 250 mL. Patient denies dizziness, chest pain, shortness of breath, abdominal pain,D/C, new weakness, numbess, tingling.   Objective:   Vitals:   03/12/17 0932 03/12/17 0942 03/12/17 1102 03/12/17 1200  BP: (!) 51/39 (!) 84/37 (!) 97/48 123/64  Pulse: 89     Resp: 19     Temp: 97.9 F (36.6 C)     TempSrc: Oral     SpO2: 97%     Weight:      Height:        Intake/Output Summary (Last 24 hours) at 03/12/17 1248 Last data filed at 03/12/17 0932  Gross per 24 hour  Intake           1228.5 ml  Output             2100 ml  Net           -871.5 ml     Wt Readings from Last 3 Encounters:  03/12/17 (!) 153.3 kg (337 lb 15.4 oz)  02/03/17 (!) 154.2 kg (340 lb)  01/24/17 (!) 158.8 kg (350 lb)     Exam  General: Alert and oriented x 3, NAD  HEENT:    Neck: Supple, no JVD, no masses  Cardiovascular: S1 S2Clear, RRR  Respiratory: Clear to auscultation bilaterally, no wheezing, rales or rhonchi  Gastrointestinal: Soft, nontender, nondistended, + bowel sounds  Ext: no cyanosis clubbing or edema  Neuro: No new deficits  Skin: No rashes  Psych: Normal affect and demeanor, alert and oriented x3   Genitourinary: Dressing intact   Data Reviewed:  I have personally reviewed following labs and imaging studies  Micro Results Recent Results (from the past 240 hour(s))  Culture, blood (routine x 2)     Status: None   Collection Time:  03/04/17  9:27 AM  Result Value Ref Range Status   Specimen Description BLOOD LEFT ANTECUBITAL  Final   Special Requests   Final    BOTTLES DRAWN AEROBIC AND ANAEROBIC Blood Culture adequate volume   Culture   Final    NO GROWTH 5 DAYS Performed at [facility], [address],  [place], [place] [address]    Report Status 03/09/2017 FINAL  Final  Culture, blood (routine x 2)     Status: Abnormal   Collection Time: 03/04/17 10:31 AM  Result Value Ref Range Status   Specimen Description BLOOD LEFT HAND  Final   Special Requests   Final    IN PEDIATRIC BOTTLE Blood Culture results may not be optimal due to an excessive volume of blood received in culture bottles   Culture  Setup Time   Final    GRAM NEGATIVE RODS IN PEDIATRIC BOTTLE CRITICAL RESULT CALLED TO, READ BACK BY AND VERIFIED WITH: R RUMBARGER 03/05/17 @ 0914 M VESTAL    Culture PROTEUS MIRABILIS (A)  Final   Report Status 03/07/2017 FINAL  Final   Organism ID, Bacteria PROTEUS MIRABILIS  Final      Susceptibility   Proteus mirabilis - MIC*    AMPICILLIN <=2 SENSITIVE Sensitive     CEFAZOLIN <=4 SENSITIVE Sensitive     CEFEPIME <=1 SENSITIVE Sensitive     CEFTAZIDIME <=1 SENSITIVE Sensitive     CEFTRIAXONE <=1 SENSITIVE Sensitive     CIPROFLOXACIN <=0.25 SENSITIVE Sensitive     GENTAMICIN <=1 SENSITIVE Sensitive     IMIPENEM 4 SENSITIVE Sensitive     TRIMETH/SULFA <=20 SENSITIVE Sensitive     AMPICILLIN/SULBACTAM <=2 SENSITIVE Sensitive     PIP/TAZO <=4 SENSITIVE Sensitive     * PROTEUS MIRABILIS  Blood Culture ID Panel (Reflexed)     Status: Abnormal   Collection Time: 03/04/17 10:31 AM  Result Value Ref Range Status   Enterococcus species NOT DETECTED NOT DETECTED Final   Listeria monocytogenes NOT DETECTED NOT DETECTED Final   Staphylococcus species NOT DETECTED NOT DETECTED Final   Staphylococcus aureus NOT DETECTED NOT DETECTED Final   Streptococcus species NOT DETECTED NOT DETECTED Final   Streptococcus agalactiae NOT DETECTED NOT DETECTED Final   Streptococcus pneumoniae NOT DETECTED NOT DETECTED Final   Streptococcus pyogenes NOT DETECTED NOT DETECTED Final   Acinetobacter baumannii NOT DETECTED NOT DETECTED Final   Enterobacteriaceae species DETECTED (A) NOT DETECTED Final    Comment:  Enterobacteriaceae represent a large family of gram-negative bacteria, not a single organism. CRITICAL RESULT CALLED TO, READ BACK BY AND VERIFIED WITH: R RUMBARGER,PHARMD AT 4235 03/05/17 BY L BENFIELD    Enterobacter cloacae complex NOT DETECTED NOT DETECTED Final   Escherichia coli NOT DETECTED NOT DETECTED Final   Klebsiella oxytoca NOT DETECTED NOT DETECTED Final   Klebsiella pneumoniae NOT DETECTED NOT DETECTED Final   Proteus species DETECTED (A) NOT DETECTED Final    Comment: CRITICAL RESULT CALLED TO, READ BACK BY AND VERIFIED WITH: R RUMBARGER,PHARMD AT 0914 03/05/17 BY M VESTAL    Serratia marcescens NOT DETECTED NOT DETECTED Final   Carbapenem resistance NOT DETECTED NOT DETECTED Final   Haemophilus influenzae NOT DETECTED NOT DETECTED Final   Neisseria meningitidis NOT DETECTED NOT DETECTED Final   Pseudomonas aeruginosa NOT DETECTED NOT DETECTED Final   Candida albicans NOT DETECTED NOT DETECTED Final   Candida glabrata NOT DETECTED NOT DETECTED Final   Candida krusei NOT DETECTED NOT DETECTED Final  Candida parapsilosis NOT DETECTED NOT DETECTED Final   Candida tropicalis NOT DETECTED NOT DETECTED Final    Comment: Performed at [facility], [place] [address] [place], [place] [address]  Respiratory Panel by PCR     Status: None   Collection Time: 03/04/17  2:56 PM  Result Value Ref Range Status   Adenovirus NOT DETECTED NOT DETECTED Final   Coronavirus 229E NOT DETECTED NOT DETECTED Final   Coronavirus HKU1 NOT DETECTED NOT DETECTED Final   Coronavirus NL63 NOT DETECTED NOT DETECTED Final   Coronavirus OC43 NOT DETECTED NOT DETECTED Final   Metapneumovirus NOT DETECTED NOT DETECTED Final   Rhinovirus / Enterovirus NOT DETECTED NOT DETECTED Final   Influenza A NOT DETECTED NOT DETECTED Final   Influenza B NOT DETECTED NOT DETECTED Final   Parainfluenza Virus 1 NOT DETECTED NOT DETECTED Final   Parainfluenza Virus 2 NOT DETECTED NOT DETECTED Final   Parainfluenza  Virus 3 NOT DETECTED NOT DETECTED Final   Parainfluenza Virus 4 NOT DETECTED NOT DETECTED Final   Respiratory Syncytial Virus NOT DETECTED NOT DETECTED Final   Bordetella pertussis NOT DETECTED NOT DETECTED Final   Chlamydophila pneumoniae NOT DETECTED NOT DETECTED Final   Mycoplasma pneumoniae NOT DETECTED NOT DETECTED Final    Comment: Performed at [facility], [place] [address], [place], [place] [address]  MRSA PCR Screening     Status: None   Collection Time: 03/04/17  9:50 PM  Result Value Ref Range Status   MRSA by PCR NEGATIVE NEGATIVE Final    Comment:        The GeneXpert MRSA Assay (FDA approved for NASAL specimens only), is one component of a comprehensive MRSA colonization surveillance program. It is not intended to diagnose MRSA infection nor to guide or monitor treatment for MRSA infections.   Urine culture     Status: None   Collection Time: 03/07/17  1:10 PM  Result Value Ref Range Status   Specimen Description URINE, CATHETERIZED  Final   Special Requests NONE  Final   Culture NO GROWTH  Final   Report Status 03/08/2017 FINAL  Final  Surgical pcr screen     Status: None   Collection Time: 03/10/17  3:34 PM  Result Value Ref Range Status   MRSA, PCR NEGATIVE NEGATIVE Final   Staphylococcus aureus NEGATIVE NEGATIVE Final    Comment:        The Xpert SA Assay (FDA approved for NASAL specimens in patients over 64 years of age), is one component of a comprehensive surveillance program.  Test performance has been validated by [facility] for patients greater than or equal to 2 year old. It is not intended to diagnose infection nor to guide or monitor treatment.   Aerobic/Anaerobic Culture (surgical/deep wound)     Status: None (Preliminary result)   Collection Time: 03/11/17 12:32 PM  Result Value Ref Range Status   Specimen Description ABSCESS SCROTUM  Final   Special Requests NONE  Final   Gram Stain   Final    RARE WBC PRESENT, PREDOMINANTLY  PMN NO ORGANISMS SEEN    Culture CULTURE REINCUBATED FOR BETTER GROWTH  Final   Report Status PENDING  Incomplete    Radiology Reports Dg Chest 2 View  Result Date: 03/04/2017 CLINICAL DATA:  Increased shortness of breath and decreased O2 saturations. EXAM: CHEST  2 VIEW COMPARISON:  01/07/2014 FINDINGS: Azygos shadow and central vascular structures are mildly prominent. Findings could be related to vascular congestion.  There is no frank pulmonary edema. Heart size is within limits. No pleural effusions. No acute bone abnormality. Negative for a pneumothorax. IMPRESSION: Probable vascular congestion without overt pulmonary edema. Electronically Signed   By: Markus Daft M.D.   On: 03/04/2017 09:25   Ct Angio Chest Pe W/cm &/or Wo Cm  Result Date: 03/04/2017 CLINICAL DATA:  61 year old male with increased shortness of breath and hypoxia following dialysis earlier today. EXAM: CT ANGIOGRAPHY CHEST WITH CONTRAST TECHNIQUE: Multidetector CT imaging of the chest was performed using the standard protocol during bolus administration of intravenous contrast. Multiplanar CT image reconstructions and MIPs were obtained to evaluate the vascular anatomy. CONTRAST:  100 mL Isovue 370 COMPARISON:  Chest x-ray 03/04/2017; prior CT scan of the chest 07/09/2014 FINDINGS: Cardiovascular: Limited opacification of the pulmonary arteries secondary to transient physiologic interruption of contrast. No central or proximal or lobar PE. No evidence of aortic dissection or aneurysm. Aberrant right subclavian artery noticed incidentally. The heart is enlarged. No pericardial effusion. Mediastinum/Nodes: Unremarkable CT appearance of the thyroid gland. No suspicious mediastinal or hilar adenopathy. No soft tissue mediastinal mass. The thoracic esophagus is unremarkable. Lungs/Pleura: Respiratory motion artifact limits evaluation for small pulmonary nodules. No focal airspace consolidation, pleural effusion, pneumothorax or  pulmonary edema. Mild dependent atelectasis present in both lower lobes. Upper Abdomen: Although the liver is incompletely evaluated, the left hepatic lobe and caudate lobe appear relatively hypertrophied which may suggest early cirrhotic change. No acute abnormalities within the upper abdomen. Musculoskeletal: No acute fracture or aggressive appearing lytic or blastic osseous lesion. Review of the MIP images confirms the above findings. IMPRESSION: 1. No evidence of acute pulmonary embolus to the level of the proximal lobar arteries. Evaluation of the more distal pulmonary arterial tree is nondiagnostic secondary to a combination of relatively poor vessel opacification and respiratory motion artifact. 2. No evidence of pneumonia or other acute cardiopulmonary process. 3. Bibasilar subsegmental atelectasis. 4. Aberrant right subclavian artery. 5. Although incompletely imaged, the morphology of the liver suggests the possibility of cirrhotic change. Does the patient have clinical signs/symptoms of cirrhosis? Electronically Signed   By: Jacqulynn Cadet M.D.   On: 03/04/2017 12:31   US Scrotum  Result Date: 03/04/2017 CLINICAL DATA:  Scrotal pain and swelling. EXAM: SCROTAL ULTRASOUND DOPPLER ULTRASOUND OF THE TESTICLES TECHNIQUE: Complete ultrasound examination of the testicles, epididymis, and other scrotal structures was performed. Color and spectral Doppler ultrasound were also utilized to evaluate blood flow to the testicles. COMPARISON:  No recent prior. FINDINGS: Right testicle Measurements: 3.4 x 2.0 x 2.5 cm. No mass or microlithiasis visualized. Left testicle Measurements: 3.7 x 2.4 x 2.8 cm. 0.6 x 0.3 x 0.4 small area of increased echogenicity noted in the left testicle. Although this may represent a focal area of fat or a at left testicular tumor cannot be completely excluded. No microlithiasis visualized . Right epididymis:  Tail of the epididymis is prominent. Left epididymis: Prominent epididymis  with heterogeneous echotexture. 7 mm epididymal cyst. Hydrocele:  None visualized. Varicocele: Could not perform Valsalva maneuver due to patient's clinical condition. Bilateral scrotal skin thickening. Pulsed Doppler interrogation of both testes demonstrates normal low resistance arterial and venous waveforms bilaterally. IMPRESSION: 1. Cannot exclude bilateral epididymitis. 2. Small 0.6 cm area of increased echogenicity noted in the left testicle. Although this may represent small area of fat a small left testicular tumor cannot be excluded no evidence of torsion. Electronically Signed   By: Marcello Moores  Register   On: 03/04/2017 11:32  Ct Abdomen Pelvis W Contrast  Result Date: 03/06/2017 CLINICAL DATA:  Scrotal pain and swelling for about a week. Question abscess. EXAM: CT ABDOMEN AND PELVIS WITH CONTRAST TECHNIQUE: Multidetector CT imaging of the abdomen and pelvis was performed using the standard protocol following bolus administration of intravenous contrast. CONTRAST:  117mL ISOVUE-300 IOPAMIDOL (ISOVUE-300) INJECTION 61% COMPARISON:  Ultrasound exam from 03/04/2017. FINDINGS: Lower chest: Dependent atelectasis in the lower lobes. Hepatobiliary: The liver shows diffusely decreased attenuation suggesting steatosis. Gallbladder decompressed. No intrahepatic or extrahepatic biliary dilation. Pancreas: No focal mass lesion. No dilatation of the main duct. No intraparenchymal cyst. No peripancreatic edema. Spleen: No splenomegaly. No focal mass lesion. Adrenals/Urinary Tract: Bilateral adrenal thickening without nodule or mass. 14 mm hyperattenuating lesion is identified in the interpolar right kidney (image 45 series 9). No suspicious mass lesion in the left kidney. No evidence for hydroureter. Bladder wall appears thickened although bladder is decompressed. Stomach/Bowel: Stomach is nondistended. No gastric wall thickening. No evidence of outlet obstruction. Duodenum is normally positioned as is the ligament of  Treitz. No small bowel wall thickening. No small bowel dilatation. The terminal ileum is normal. The appendix is not visualized, but there is no edema or inflammation in the region of the cecum. Diverticular changes are noted in the left colon without evidence of diverticulitis. Vascular/Lymphatic: There is abdominal aortic atherosclerosis without aneurysm. 13 mm short axis hepatoduodenal ligament lymph node is borderline to mildly enlarged. No retroperitoneal lymphadenopathy. No pelvic sidewall lymphadenopathy. Reproductive: Prostate gland unremarkable. Marked skin thickening and subcutaneous edema noted in the penis and scrotum. No focal or rim enhancing fluid collection is identified to suggest abscess. No evidence for soft tissue gas in the tissues of the perineum. Other: No intraperitoneal free fluid. Musculoskeletal: Bone windows reveal no worrisome lytic or sclerotic osseous lesions. IMPRESSION: 1. Marked skin thickening and subcutaneous edema in the soft tissues of the penis and scrotum. No focal or rim enhancing fluid collection to suggest abscess. No gas in the scrotum or perineum to suggest Fournier's gangrene. 2. 14 mm hyperattenuating lesion interpolar right kidney could represent neoplasm. MRI without and with contrast recommended to further evaluate. 3. 13 mm short axis hepatoduodenal ligament lymph node upper normal to borderline increased but nonspecific. 4. Bilateral lower lobe atelectasis. 5.  Abdominal Aortic Atherosclerois (ICD10-170.0) 6. Fatty liver with asymmetric enlargement of left hepatic lobe. No contour nodularity to suggest overt cirrhosis. Electronically Signed   By: Misty Stanley M.D.   On: 03/06/2017 16:26   [place] Art/ven Flow Abd Pelv Doppler  Result Date: 03/04/2017 CLINICAL DATA:  Scrotal pain and swelling. EXAM: SCROTAL ULTRASOUND DOPPLER ULTRASOUND OF THE TESTICLES TECHNIQUE: Complete ultrasound examination of the testicles, epididymis, and other scrotal structures was  performed. Color and spectral Doppler ultrasound were also utilized to evaluate blood flow to the testicles. COMPARISON:  No recent prior. FINDINGS: Right testicle Measurements: 3.4 x 2.0 x 2.5 cm. No mass or microlithiasis visualized. Left testicle Measurements: 3.7 x 2.4 x 2.8 cm. 0.6 x 0.3 x 0.4 small area of increased echogenicity noted in the left testicle. Although this may represent a focal area of fat or a at left testicular tumor cannot be completely excluded. No microlithiasis visualized . Right epididymis:  Tail of the epididymis is prominent. Left epididymis: Prominent epididymis with heterogeneous echotexture. 7 mm epididymal cyst. Hydrocele:  None visualized. Varicocele: Could not perform Valsalva maneuver due to patient's clinical condition. Bilateral scrotal skin thickening. Pulsed Doppler interrogation of both testes demonstrates normal low resistance arterial  and venous waveforms bilaterally. IMPRESSION: 1. Cannot exclude bilateral epididymitis. 2. Small 0.6 cm area of increased echogenicity noted in the left testicle. Although this may represent small area of fat a small left testicular tumor cannot be excluded no evidence of torsion. Electronically Signed   By: Marcello Moores  Register   On: 03/04/2017 11:32   Dg Chest Port 1 View  Result Date: 03/07/2017 CLINICAL DATA:  Dialysis, shortness of breath EXAM: PORTABLE CHEST 1 VIEW COMPARISON:  CT chest 03/04/2017 FINDINGS: There is no focal parenchymal opacity. There is no pleural effusion or pneumothorax. There is stable cardiomegaly. The osseous structures are unremarkable. IMPRESSION: No active disease. Electronically Signed   By: Kathreen Devoid   On: 03/07/2017 09:24    Lab Data:  CBC:  Recent Labs Lab 03/06/17 0701 03/07/17 0555 03/08/17 0259 03/09/17 0717 03/10/17 0542  WBC 18.1* 14.2* 16.9* 19.9* 23.1*  NEUTROABS 14.6*  --   --  14.9* 16.2*  HGB 9.7* 10.1* 9.7* 10.1* 9.9*  HCT 30.4* 31.3* 30.6* 31.6* 30.7*  MCV 75.8* 75.1* 75.6*  76.0* 75.8*  PLT 126* 132* 166 191 852   Basic Metabolic Panel:  Recent Labs Lab 03/08/17 0259 03/09/17 0717 03/10/17 0542 03/11/17 0631 03/12/17 0342  NA 133* 130* 129* 131* 131*  K 3.9 3.4* 3.8 3.4* 3.5  CL 94* 92* 92* 94* 95*  CO2 24 23 21* 22 24  GLUCOSE 115* 220* 84 189* 211*  BUN 48* 41* 52* 38* 25*  CREATININE 10.61* 10.36* 12.52* 10.28* 8.26*  CALCIUM 8.3* 9.1 8.8* 8.8* 8.3*  PHOS 5.6* 5.2* 6.6* 5.4* 5.1*   GFR: Estimated Creatinine Clearance: 14.7 mL/min (A) (by C-G formula based on SCr of 8.26 mg/dL (H)). Liver Function Tests:  Recent Labs Lab 03/08/17 0259 03/09/17 0717 03/10/17 0542 03/11/17 0631 03/12/17 0342  ALBUMIN 1.9* 2.0* 2.0* 2.1* 2.0*   No results for input(s): LIPASE, AMYLASE in the last 168 hours. No results for input(s): AMMONIA in the last 168 hours. Coagulation Profile: No results for input(s): INR, PROTIME in the last 168 hours. Cardiac Enzymes: No results for input(s): CKTOTAL, CKMB, CKMBINDEX, TROPONINI in the last 168 hours. BNP (last 3 results) No results for input(s): PROBNP in the last 8760 hours. HbA1C: No results for input(s): HGBA1C in the last 72 hours. CBG:  Recent Labs Lab 03/11/17 1046 03/11/17 1326 03/11/17 2126 03/12/17 0823 03/12/17 1223  GLUCAP 179* 138* 264* 203* 169*   Lipid Profile: No results for input(s): CHOL, HDL, LDLCALC, TRIG, CHOLHDL, LDLDIRECT in the last 72 hours. Thyroid Function Tests: No results for input(s): TSH, T4TOTAL, FREET4, T3FREE, THYROIDAB in the last 72 hours. Anemia Panel: No results for input(s): VITAMINB12, FOLATE, FERRITIN, TIBC, IRON, RETICCTPCT in the last 72 hours. Urine analysis:    Component Value Date/Time   COLORURINE YELLOW 01/10/2012 1404   APPEARANCEUR CLEAR 01/10/2012 1404   LABSPEC 1.023 01/10/2012 1404   PHURINE 6.0 01/10/2012 1404   GLUCOSEU >1000 (A) 01/10/2012 1404   HGBUR TRACE (A) 01/10/2012 1404   BILIRUBINUR NEGATIVE 01/10/2012 1404   KETONESUR NEGATIVE  01/10/2012 1404   PROTEINUR >300 (A) 01/10/2012 1404   UROBILINOGEN 1.0 01/10/2012 1404   NITRITE NEGATIVE 01/10/2012 1404   LEUKOCYTESUR NEGATIVE 01/10/2012 1404     Jesiah Grismer M.D. [facility] 03/12/2017, 12:48 PM  Pager: [phone] Between 7am to 7pm - call Pager - [phone]  After 7pm go to [email or web address] - password [email or web address]  Call night coverage person covering after 7pm

## 2017-03-12 NOTE — Progress Notes (Signed)
1 Day Post-Op  Subjective:  1 - Scrotal Abscess - s/p operative I+D by Dahlstedet 03/11/17. WCX pending / NGTD. ON empiric ancef + vanc pending further CX data.  2 - ESRD - on IHD MWF throgh Rt arm AVF. No hydron on renal imaging x many.  Today "[name]" is stable. NO high grade fevers.   Objective: Vital signs in last 24 hours: Temp:  [97.4 F (36.3 C)-98.9 F (37.2 C)] 98.4 F (36.9 C) (04/28 0450) Pulse Rate:  [76-85] 80 (04/28 0450) Resp:  [18-24] 20 (04/28 0450) BP: (86-128)/(46-90) 95/46 (04/28 0450) SpO2:  [90 %-100 %] 97 % (04/28 0450) Weight:  [153.2 kg (337 lb 11.9 oz)-155.4 kg (342 lb 9.5 oz)] 153.3 kg (337 lb 15.4 oz) (04/28 0450) Last BM Date: 03/11/17  Intake/Output from previous day: 04/27 0701 - 04/28 0700 In: 988.5 [P.O.:120; I.V.:568.5; IV Piggyback:300] Out: 2105 [Blood:5] Intake/Output this shift: No intake/output data recorded.  General appearance: alert, cooperative, appears stated age and family at bedside Eyes: negative Nose: Nares normal. Septum midline. Mucosa normal. No drainage or sinus tenderness. Throat: lips, mucosa, and tongue normal; teeth and gums normal Neck: supple, symmetrical, trachea midline Back: symmetric, no curvature. ROM normal. No CVA tenderness. Resp: non-labored on room air at present Cardio: RRR GI: morbid truncal obesity limits sensitivity of exam. NO r/g  Male genitalia: significant penoscrtotal edema w/o crepitus. Prior left I+D site with scant non-foul drainage and packing material wtih approx 4cm externalized wick.  Extremities: extremities normal, atraumatic, no cyanosis or edema and RUE AVF with thrill.  Pulses: 2+ and symmetric Lymph nodes: Cervical, supraclavicular, and axillary nodes normal.  Lab Results:   Recent Labs  03/10/17 0542  WBC 23.1*  HGB 9.9*  HCT 30.7*  PLT 249   BMET  Recent Labs  03/11/17 0631 03/12/17 0342  NA 131* 131*  K 3.4* 3.5  CL 94* 95*  CO2 22 24  GLUCOSE 189* 211*  BUN 38*  25*  CREATININE 10.28* 8.26*  CALCIUM 8.8* 8.3*   PT/INR No results for input(s): LABPROT, INR in the last 72 hours. ABG No results for input(s): PHART, HCO3 in the last 72 hours.  Invalid input(s): PCO2, PO2  Studies/Results: No results found.  Anti-infectives: Anti-infectives    Start     Dose/Rate Route Frequency Ordered Stop   03/11/17 1800  ceFAZolin (ANCEF) IVPB 2g/100 mL premix     2 g 200 mL/hr over 30 Minutes Intravenous Every M-W-F (1800) 03/11/17 1433     03/11/17 1530  vancomycin (VANCOCIN) IVPB 1000 mg/200 mL premix     1,000 mg 200 mL/hr over 60 Minutes Intravenous Every M-W-F (Hemodialysis) 03/11/17 1433     03/11/17 1200  vancomycin (VANCOCIN) IVPB 1000 mg/200 mL premix  Status:  Discontinued     1,000 mg 200 mL/hr over 60 Minutes Intravenous Every M-W-F (Hemodialysis) 03/09/17 0935 03/10/17 1348   03/10/17 1430  ceFAZolin (ANCEF) IVPB 2g/100 mL premix     2 g 200 mL/hr over 30 Minutes Intravenous  Once 03/10/17 1342 03/10/17 1505   03/10/17 1430  vancomycin (VANCOCIN) IVPB 1000 mg/200 mL premix     1,000 mg 200 mL/hr over 60 Minutes Intravenous  Once 03/10/17 1345 03/10/17 1617   03/09/17 2000  ceFAZolin (ANCEF) IVPB 2g/100 mL premix  Status:  Discontinued     2 g 200 mL/hr over 30 Minutes Intravenous Every M-W-F (2000) 03/08/17 1118 03/09/17 0935   03/09/17 1800  ceFAZolin (ANCEF) IVPB 2g/100 mL premix  Status:  Discontinued     2 g 200 mL/hr over 30 Minutes Intravenous Every M-W-F (Hemodialysis) 03/07/17 1759 03/08/17 1118   03/09/17 1530  vancomycin (VANCOCIN) 1,500 mg in sodium chloride 0.9 % 500 mL IVPB     1,500 mg 250 mL/hr over 120 Minutes Intravenous  Once 03/09/17 1445 03/09/17 2048   03/09/17 1200  vancomycin (VANCOCIN) 1,500 mg in sodium chloride 0.9 % 500 mL IVPB  Status:  Discontinued     1,500 mg 250 mL/hr over 120 Minutes Intravenous Every Wed (Hemodialysis) 03/09/17 1011 03/09/17 1445   03/08/17 2000  ceFAZolin (ANCEF) IVPB 2g/100 mL  premix     2 g 200 mL/hr over 30 Minutes Intravenous  Once 03/08/17 1118 03/08/17 2114   03/07/17 1830  ceFAZolin (ANCEF) IVPB 2g/100 mL premix     2 g 200 mL/hr over 30 Minutes Intravenous  Once 03/07/17 1758 03/07/17 2056   03/05/17 1800  vancomycin (VANCOCIN) IVPB 750 mg/150 ml premix     750 mg 150 mL/hr over 60 Minutes Intravenous Every T-Th-Sa (Hemodialysis) 03/05/17 1341 03/05/17 2020   03/05/17 1500  cefTRIAXone (ROCEPHIN) 2 g in dextrose 5 % 50 mL IVPB  Status:  Discontinued     2 g 100 mL/hr over 30 Minutes Intravenous Every 24 hours 03/05/17 1427 03/07/17 1753   03/04/17 2200  piperacillin-tazobactam (ZOSYN) IVPB 3.375 g  Status:  Discontinued     3.375 g 12.5 mL/hr over 240 Minutes Intravenous Every 12 hours 03/04/17 1318 03/05/17 1427   03/04/17 1030  vancomycin (VANCOCIN) 2,500 mg in sodium chloride 0.9 % 500 mL IVPB     2,500 mg 250 mL/hr over 120 Minutes Intravenous  Once 03/04/17 1016 03/04/17 1339   03/04/17 1015  piperacillin-tazobactam (ZOSYN) IVPB 3.375 g     3.375 g 100 mL/hr over 30 Minutes Intravenous  Once 03/04/17 1007 03/04/17 1114   03/04/17 1015  vancomycin (VANCOCIN) IVPB 1000 mg/200 mL premix  Status:  Discontinued     1,000 mg 200 mL/hr over 60 Minutes Intravenous  Once 03/04/17 1007 03/04/17 1015      Assessment/Plan:  1 - Scrotal Abscess - now s/p I+D. Current packing to remain for now. Agree with current ABX pending further CX data. Will follow. Most of this appears chronic edema.   2 - ESRD - per nerphrology.   [facility], Assunta Pupo 03/12/2017

## 2017-03-12 NOTE — Progress Notes (Signed)
IR consulted for shuntogram of right radiocephalic fistula due to cannulation issues.  Patient currently able to undergo HD. Last HD session was yesterday evening.  Will plan for shuntogram early next week if patient remains inpatient.   Brynda Greathouse, MS RD PA-C 10:02 AM

## 2017-03-13 LAB — RENAL FUNCTION PANEL
ANION GAP: 14 (ref 5–15)
Albumin: 2.1 g/dL — ABNORMAL LOW (ref 3.5–5.0)
BUN: 35 mg/dL — ABNORMAL HIGH (ref 6–20)
CHLORIDE: 96 mmol/L — AB (ref 101–111)
CO2: 21 mmol/L — ABNORMAL LOW (ref 22–32)
Calcium: 8.6 mg/dL — ABNORMAL LOW (ref 8.9–10.3)
Creatinine, Ser: 11.16 mg/dL — ABNORMAL HIGH (ref 0.61–1.24)
GFR calc non Af Amer: 4 mL/min — ABNORMAL LOW (ref >60)
GFR, EST AFRICAN AMERICAN: 5 mL/min — AB (ref >60)
Glucose, Bld: 133 mg/dL — ABNORMAL HIGH (ref 65–99)
Phosphorus: 7.3 mg/dL — ABNORMAL HIGH (ref 2.5–4.6)
Potassium: 3.6 mmol/L (ref 3.5–5.1)
Sodium: 131 mmol/L — ABNORMAL LOW (ref 135–145)

## 2017-03-13 LAB — GLUCOSE, CAPILLARY
GLUCOSE-CAPILLARY: 217 mg/dL — AB (ref 65–99)
GLUCOSE-CAPILLARY: 107 mg/dL — AB (ref 65–99)
GLUCOSE-CAPILLARY: 186 mg/dL — AB (ref 65–99)
Glucose-Capillary: 223 mg/dL — ABNORMAL HIGH (ref 65–99)

## 2017-03-13 NOTE — Progress Notes (Signed)
Patient placed on CPAP at 10 cmH2O. Tolerating well at this time. No issues will call if any further assistance needed.

## 2017-03-13 NOTE — Progress Notes (Signed)
2 Days Post-Op  Subjective:  1 - Scrotal Abscess - s/p operative I+D by Dahlstedet 03/11/17. WCX pending / NGTD. ON empiric ancef + vanc pending further CX data.  2 - ESRD - on IHD MWF throgh Rt arm AVF. No hydron on renal imaging x many.  Today "[name]" is stable. Had scrotal packing changed yesterday.   Objective: Vital signs in last 24 hours: Temp:  [97.5 F (36.4 C)-98.4 F (36.9 C)] 97.5 F (36.4 C) (04/29 0422) Pulse Rate:  [78-89] 88 (04/29 0422) Resp:  [18-20] 20 (04/29 0422) BP: (51-126)/(37-74) 126/64 (04/29 0422) SpO2:  [95 %-97 %] 96 % (04/29 0422) Weight:  [154.3 kg (340 lb 2.7 oz)] 154.3 kg (340 lb 2.7 oz) (04/29 0500) Last BM Date: 03/11/17  Intake/Output from previous day: 04/28 0701 - 04/29 0700 In: 600 [P.O.:480; I.V.:120] Out: 1 [Stool:1] Intake/Output this shift: Total I/O In: 0  Out: 1 [Stool:1]  General appearance: alert, cooperative and appears stated age Eyes: negative Nose: Nares normal. Septum midline. Mucosa normal. No drainage or sinus tenderness. Throat: lips, mucosa, and tongue normal; teeth and gums normal Neck: supple, symmetrical, trachea midline Back: symmetric, no curvature. ROM normal. No CVA tenderness. Resp: non-labored on [place] O2 at present GI: soft, non-tender; bowel sounds normal; no masses,  no organomegaly and large obesity, reducible umbilical hernia. Male genitalia: stable large penoscrotal edema. Left lateral scrotal packing with scant non-foul wound drainage. No crepitus. Extremities: extremities normal, atraumatic, no cyanosis or edema and RUE AVF noted.  Lymph nodes: Cervical, supraclavicular, and axillary nodes normal. Neurologic: Grossly normal  Lab Results:  No results for input(s): WBC, HGB, HCT, PLT in the last 72 hours. BMET  Recent Labs  03/12/17 0342 03/13/17 0457  NA 131* 131*  K 3.5 3.6  CL 95* 96*  CO2 24 21*  GLUCOSE 211* 133*  BUN 25* 35*  CREATININE 8.26* 11.16*  CALCIUM 8.3* 8.6*   PT/INR No  results for input(s): LABPROT, INR in the last 72 hours. ABG No results for input(s): PHART, HCO3 in the last 72 hours.  Invalid input(s): PCO2, PO2  Studies/Results: No results found.  Anti-infectives: Anti-infectives    Start     Dose/Rate Route Frequency Ordered Stop   03/11/17 1800  ceFAZolin (ANCEF) IVPB 2g/100 mL premix     2 g 200 mL/hr over 30 Minutes Intravenous Every M-W-F (1800) 03/11/17 1433     03/11/17 1530  vancomycin (VANCOCIN) IVPB 1000 mg/200 mL premix     1,000 mg 200 mL/hr over 60 Minutes Intravenous Every M-W-F (Hemodialysis) 03/11/17 1433     03/11/17 1200  vancomycin (VANCOCIN) IVPB 1000 mg/200 mL premix  Status:  Discontinued     1,000 mg 200 mL/hr over 60 Minutes Intravenous Every M-W-F (Hemodialysis) 03/09/17 0935 03/10/17 1348   03/10/17 1430  ceFAZolin (ANCEF) IVPB 2g/100 mL premix     2 g 200 mL/hr over 30 Minutes Intravenous  Once 03/10/17 1342 03/10/17 1505   03/10/17 1430  vancomycin (VANCOCIN) IVPB 1000 mg/200 mL premix     1,000 mg 200 mL/hr over 60 Minutes Intravenous  Once 03/10/17 1345 03/10/17 1617   03/09/17 2000  ceFAZolin (ANCEF) IVPB 2g/100 mL premix  Status:  Discontinued     2 g 200 mL/hr over 30 Minutes Intravenous Every M-W-F (2000) 03/08/17 1118 03/09/17 0935   03/09/17 1800  ceFAZolin (ANCEF) IVPB 2g/100 mL premix  Status:  Discontinued     2 g 200 mL/hr over 30 Minutes Intravenous Every M-W-F (Hemodialysis)  03/07/17 1759 03/08/17 1118   03/09/17 1530  vancomycin (VANCOCIN) 1,500 mg in sodium chloride 0.9 % 500 mL IVPB     1,500 mg 250 mL/hr over 120 Minutes Intravenous  Once 03/09/17 1445 03/09/17 2048   03/09/17 1200  vancomycin (VANCOCIN) 1,500 mg in sodium chloride 0.9 % 500 mL IVPB  Status:  Discontinued     1,500 mg 250 mL/hr over 120 Minutes Intravenous Every Wed (Hemodialysis) 03/09/17 1011 03/09/17 1445   03/08/17 2000  ceFAZolin (ANCEF) IVPB 2g/100 mL premix     2 g 200 mL/hr over 30 Minutes Intravenous  Once 03/08/17  1118 03/08/17 2114   03/07/17 1830  ceFAZolin (ANCEF) IVPB 2g/100 mL premix     2 g 200 mL/hr over 30 Minutes Intravenous  Once 03/07/17 1758 03/07/17 2056   03/05/17 1800  vancomycin (VANCOCIN) IVPB 750 mg/150 ml premix     750 mg 150 mL/hr over 60 Minutes Intravenous Every T-Th-Sa (Hemodialysis) 03/05/17 1341 03/05/17 2020   03/05/17 1500  cefTRIAXone (ROCEPHIN) 2 g in dextrose 5 % 50 mL IVPB  Status:  Discontinued     2 g 100 mL/hr over 30 Minutes Intravenous Every 24 hours 03/05/17 1427 03/07/17 1753   03/04/17 2200  piperacillin-tazobactam (ZOSYN) IVPB 3.375 g  Status:  Discontinued     3.375 g 12.5 mL/hr over 240 Minutes Intravenous Every 12 hours 03/04/17 1318 03/05/17 1427   03/04/17 1030  vancomycin (VANCOCIN) 2,500 mg in sodium chloride 0.9 % 500 mL IVPB     2,500 mg 250 mL/hr over 120 Minutes Intravenous  Once 03/04/17 1016 03/04/17 1339   03/04/17 1015  piperacillin-tazobactam (ZOSYN) IVPB 3.375 g     3.375 g 100 mL/hr over 30 Minutes Intravenous  Once 03/04/17 1007 03/04/17 1114   03/04/17 1015  vancomycin (VANCOCIN) IVPB 1000 mg/200 mL premix  Status:  Discontinued     1,000 mg 200 mL/hr over 60 Minutes Intravenous  Once 03/04/17 1007 03/04/17 1015      Assessment/Plan:  1 - Scrotal Abscess - now s/p I+D.  Most of his penoscrotal swelling appears chronic edema and not infectious. Agree with current ABC pending further CX data.   2 - ESRD - per nerphrology.   [facility], Alma Muegge 03/13/2017

## 2017-03-13 NOTE — Progress Notes (Signed)
[facility]                                                                              Patient Demographics  [name], is a 61 y.o. male, DOB - 11-15-1956, FIE:[id number]  Admit date - 03/04/2017   Admitting Physician Debbe Odea, MD  Outpatient Primary MD for the patient is Gara Kroner, MD  Outpatient specialists:   LOS - 9  days    Chief Complaint  Patient presents with  . Shortness of Breath       Brief summary   Patient is a 61 year old male with ESRD on HD, MGUS, Gout, Hep C (recently treated), morbid obesity, sleep apnea who presents from dialysis for sudden shortness of breath and hypotension during the dialysis. Due to severe dyspnea, patient was sent to the hospital from dialysis center. Patient's wife reported that he was also having vomiting and diarrhea for 3 days prior to admission, patient reported his scrotum had been swollen for a few days. Patient noted to have a Proteus bacteremia likely seeded from the urine. Patient empirically on IV antibiotics. ID and urology following, patient underwent I & D on scrotum 03/09/2017.   Assessment & Plan    Principal Problem:  acute respiratory failure with hypoxia - Questionable etiology, morbidly obese patient with hypotension, elevated lactic acid level, sepsis, Proteus bacteremia, likely from scrotal abscess.  -  Patient  reported improvement with shortness of breath after hemodialysis. Influenza PCR is negative.  - Patient with some difficulty in keeping CPAP on through the night. ABG essentially normal.  - Blood cultures 1/2 positive for Proteus species. Also concerning for epididymitis.  - CT angiogram chest negative for PE.  -  2-D echo with normal EF 55-60% and grade 2 diastolic dysfunction .Patient has been seen in consultation by pulmonary. Patient has been changed to IV Ancef.  Active problems Proteus bacteremia/Sepsis, Scrotal abscess - Blood cultures 1/2 positive for Proteus.  Likely may have seeded from the urine, presented with fevers and hypotension at the time of admission. -CT abdomen and pelvis showed subcutaneous edema in the soft tissues of the penis and scrotum however no focal or rim enhancing fluid collection to suggest abscess or any Fournier's gangrene  - ID was consulted and changed IV Rocephin to IV Ancef and recommended two-week course of IV antibiotics with hemodialysis.    hypertension BP now stable- continue to hold antihypertensives  scrotal abscess - Urology consulted, underwent I&D on 4/25, blood cultures positive for Proteus  - CT abdomen and pelvis negative for any abscess or Fournier's gangrene  - Underwent I&D again on 4/27 , cultures still pending   ESRD on HD MWF - Nephrology consulted, hemodialysis per schedule   diabetes mellitus Hemoglobin A1c 13.  -  Continue Levemir, meal coverage, sliding scale insulin   hyperlipidemia Continue statin.  gout Continue allopurinol.   morbid obesity -BMI 46.7, Patient counseled on diet and weight control  obstructive sleep apnea Patient has been advised that it's in his best interest to use CPAP at bedtime - continue CPAP QHS   hepatitis C Treated.   Code Status: Full CODE STATUS  DVT Prophylaxis: Heparin subcutaneous Family Communication: Discussed in detail with the patient, all imaging results, lab results explained to the patient  Disposition Plan: When cleared by urology and nephrology  Time Spent in minutes   25 minutes  Procedures:    CT angiogram chest 03/04/2017  Chest x-ray 03/04/2017  Ultrasound of the scrotum 03/04/2017  2-D echo 03/07/2017   I&D on 4/25  I&D on 4/27    Consultants:   Nephrology Urology Pulmonary Infectious disease  Antimicrobials:   IV vancomycin 03/04/2017>>>>>> 03/06/2017  IV Zosyn 03/04/2017>>>> 03/05/2017  IV Rocephin 03/05/2017>>>>> 03/07/2017  IV Ancef 03/07/2017    Medications  Scheduled Meds: .  allopurinol  100 mg Oral Daily  . aspirin EC  81 mg Oral Daily  . calcitRIOL  3.5 mcg Oral Q M,W,F-HD  . cinacalcet  60 mg Oral Q breakfast  . darbepoetin (ARANESP) injection - DIALYSIS  40 mcg Intravenous Q Mon-HD  . ferric citrate  630 mg Oral TID WC  . gabapentin  200 mg Oral BID  . insulin aspart  0-9 Units Subcutaneous TID WC  . insulin aspart  10 Units Subcutaneous TID WC  . insulin detemir  20 Units Subcutaneous BID  . pravastatin  40 mg Oral QHS   Continuous Infusions: . sodium chloride 10 mL/hr at 03/11/17 1115  .  ceFAZolin (ANCEF) IV Stopped (03/11/17 2145)  . vancomycin Stopped (03/11/17 2035)   PRN Meds:.acetaminophen **OR** acetaminophen, calcium carbonate (dosed in mg elemental calcium), camphor-menthol **AND** hydrOXYzine, chlorproMAZINE, docusate sodium, feeding supplement (NEPRO CARB STEADY), HYDROcodone-acetaminophen, ondansetron **OR** ondansetron (ZOFRAN) IV, sorbitol, zolpidem   Antibiotics   Anti-infectives    Start     Dose/Rate Route Frequency Ordered Stop   03/11/17 1800  ceFAZolin (ANCEF) IVPB 2g/100 mL premix     2 g 200 mL/hr over 30 Minutes Intravenous Every M-W-F (1800) 03/11/17 1433     03/11/17 1530  vancomycin (VANCOCIN) IVPB 1000 mg/200 mL premix     1,000 mg 200 mL/hr over 60 Minutes Intravenous Every M-W-F (Hemodialysis) 03/11/17 1433     03/11/17 1200  vancomycin (VANCOCIN) IVPB 1000 mg/200 mL premix  Status:  Discontinued     1,000 mg 200 mL/hr over 60 Minutes Intravenous Every M-W-F (Hemodialysis) 03/09/17 0935 03/10/17 1348   03/10/17 1430  ceFAZolin (ANCEF) IVPB 2g/100 mL premix     2 g 200 mL/hr over 30 Minutes Intravenous  Once 03/10/17 1342 03/10/17 1505   03/10/17 1430  vancomycin (VANCOCIN) IVPB 1000 mg/200 mL premix     1,000 mg 200 mL/hr over 60 Minutes Intravenous  Once 03/10/17 1345 03/10/17 1617   03/09/17 2000  ceFAZolin (ANCEF) IVPB 2g/100 mL premix  Status:  Discontinued     2 g 200 mL/hr over 30 Minutes Intravenous Every  M-W-F (2000) 03/08/17 1118 03/09/17 0935   03/09/17 1800  ceFAZolin (ANCEF) IVPB 2g/100 mL premix  Status:  Discontinued     2 g 200 mL/hr over 30 Minutes Intravenous Every M-W-F (Hemodialysis) 03/07/17 1759 03/08/17 1118   03/09/17 1530  vancomycin (VANCOCIN) 1,500 mg in sodium chloride 0.9 % 500 mL IVPB     1,500 mg 250 mL/hr over 120 Minutes Intravenous  Once 03/09/17 1445 03/09/17 2048   03/09/17 1200  vancomycin (VANCOCIN) 1,500 mg in sodium chloride 0.9 % 500 mL IVPB  Status:  Discontinued     1,500 mg 250 mL/hr over 120 Minutes Intravenous Every Wed (Hemodialysis) 03/09/17 1011 03/09/17 1445   03/08/17 2000  ceFAZolin (ANCEF) IVPB 2g/100  mL premix     2 g 200 mL/hr over 30 Minutes Intravenous  Once 03/08/17 1118 03/08/17 2114   03/07/17 1830  ceFAZolin (ANCEF) IVPB 2g/100 mL premix     2 g 200 mL/hr over 30 Minutes Intravenous  Once 03/07/17 1758 03/07/17 2056   03/05/17 1800  vancomycin (VANCOCIN) IVPB 750 mg/150 ml premix     750 mg 150 mL/hr over 60 Minutes Intravenous Every T-Th-Sa (Hemodialysis) 03/05/17 1341 03/05/17 2020   03/05/17 1500  cefTRIAXone (ROCEPHIN) 2 g in dextrose 5 % 50 mL IVPB  Status:  Discontinued     2 g 100 mL/hr over 30 Minutes Intravenous Every 24 hours 03/05/17 1427 03/07/17 1753   03/04/17 2200  piperacillin-tazobactam (ZOSYN) IVPB 3.375 g  Status:  Discontinued     3.375 g 12.5 mL/hr over 240 Minutes Intravenous Every 12 hours 03/04/17 1318 03/05/17 1427   03/04/17 1030  vancomycin (VANCOCIN) 2,500 mg in sodium chloride 0.9 % 500 mL IVPB     2,500 mg 250 mL/hr over 120 Minutes Intravenous  Once 03/04/17 1016 03/04/17 1339   03/04/17 1015  piperacillin-tazobactam (ZOSYN) IVPB 3.375 g     3.375 g 100 mL/hr over 30 Minutes Intravenous  Once 03/04/17 1007 03/04/17 1114   03/04/17 1015  vancomycin (VANCOCIN) IVPB 1000 mg/200 mL premix  Status:  Discontinued     1,000 mg 200 mL/hr over 60 Minutes Intravenous  Once 03/04/17 1007 03/04/17 1015         Subjective:   Dyllin Gulley was seen and examined today. Feeling a lot better today, no complaints.   Patient denies dizziness, chest pain, shortness of breath, abdominal pain,D/C, new weakness, numbess, tingling.   Objective:   Vitals:   03/13/17 0135 03/13/17 0422 03/13/17 0500 03/13/17 0800  BP:  126/64  127/61  Pulse: 89 88  88  Resp: 20 20  16   Temp:  97.5 F (36.4 C)  97.9 F (36.6 C)  TempSrc:  Axillary  Oral  SpO2: 97% 96%    Weight:   (!) 154.3 kg (340 lb 2.7 oz)   Height:        Intake/Output Summary (Last 24 hours) at 03/13/17 1150 Last data filed at 03/13/17 9485  Gross per 24 hour  Intake              560 ml  Output                1 ml  Net              559 ml     Wt Readings from Last 3 Encounters:  03/13/17 (!) 154.3 kg (340 lb 2.7 oz)  02/03/17 (!) 154.2 kg (340 lb)  01/24/17 (!) 158.8 kg (350 lb)     Exam  General: Alert and oriented x 3, NAD  HEENT:    Neck: Supple, no JVD  Cardiovascular: S1 S2Clear, RRR  Respiratory: CTAB  Gastrointestinal: Soft, nontender, nondistended, + bowel sounds  Ext: no cyanosis clubbing or edema  Neuro: No new deficits  Skin: No rashes  Psych: Normal affect and demeanor, alert and oriented x3   Genitourinary: Dressing intact   Data Reviewed:  I have personally reviewed following labs and imaging studies  Micro Results Recent Results (from the past 240 hour(s))  Culture, blood (routine x 2)     Status: None   Collection Time: 03/04/17  9:27 AM  Result Value Ref Range Status   Specimen Description BLOOD LEFT ANTECUBITAL  Final   Special Requests   Final    BOTTLES DRAWN AEROBIC AND ANAEROBIC Blood Culture adequate volume   Culture   Final    NO GROWTH 5 DAYS Performed at [facility], [address] [place], [place] [address]    Report Status 03/09/2017 FINAL  Final  Culture, blood (routine x 2)     Status: Abnormal   Collection Time: 03/04/17 10:31 AM  Result Value Ref Range  Status   Specimen Description BLOOD LEFT HAND  Final   Special Requests   Final    IN PEDIATRIC BOTTLE Blood Culture results may not be optimal due to an excessive volume of blood received in culture bottles   Culture  Setup Time   Final    GRAM NEGATIVE RODS IN PEDIATRIC BOTTLE CRITICAL RESULT CALLED TO, READ BACK BY AND VERIFIED WITH: R RUMBARGER 03/05/17 @ 0914 M VESTAL    Culture PROTEUS MIRABILIS (A)  Final   Report Status 03/07/2017 FINAL  Final   Organism ID, Bacteria PROTEUS MIRABILIS  Final      Susceptibility   Proteus mirabilis - MIC*    AMPICILLIN <=2 SENSITIVE Sensitive     CEFAZOLIN <=4 SENSITIVE Sensitive     CEFEPIME <=1 SENSITIVE Sensitive     CEFTAZIDIME <=1 SENSITIVE Sensitive     CEFTRIAXONE <=1 SENSITIVE Sensitive     CIPROFLOXACIN <=0.25 SENSITIVE Sensitive     GENTAMICIN <=1 SENSITIVE Sensitive     IMIPENEM 4 SENSITIVE Sensitive     TRIMETH/SULFA <=20 SENSITIVE Sensitive     AMPICILLIN/SULBACTAM <=2 SENSITIVE Sensitive     PIP/TAZO <=4 SENSITIVE Sensitive     * PROTEUS MIRABILIS  Blood Culture ID Panel (Reflexed)     Status: Abnormal   Collection Time: 03/04/17 10:31 AM  Result Value Ref Range Status   Enterococcus species NOT DETECTED NOT DETECTED Final   Listeria monocytogenes NOT DETECTED NOT DETECTED Final   Staphylococcus species NOT DETECTED NOT DETECTED Final   Staphylococcus aureus NOT DETECTED NOT DETECTED Final   Streptococcus species NOT DETECTED NOT DETECTED Final   Streptococcus agalactiae NOT DETECTED NOT DETECTED Final   Streptococcus pneumoniae NOT DETECTED NOT DETECTED Final   Streptococcus pyogenes NOT DETECTED NOT DETECTED Final   Acinetobacter baumannii NOT DETECTED NOT DETECTED Final   Enterobacteriaceae species DETECTED (A) NOT DETECTED Final    Comment: Enterobacteriaceae represent a large family of gram-negative bacteria, not a single organism. CRITICAL RESULT CALLED TO, READ BACK BY AND VERIFIED WITH: R RUMBARGER,PHARMD AT 5456  03/05/17 BY L BENFIELD    Enterobacter cloacae complex NOT DETECTED NOT DETECTED Final   Escherichia coli NOT DETECTED NOT DETECTED Final   Klebsiella oxytoca NOT DETECTED NOT DETECTED Final   Klebsiella pneumoniae NOT DETECTED NOT DETECTED Final   Proteus species DETECTED (A) NOT DETECTED Final    Comment: CRITICAL RESULT CALLED TO, READ BACK BY AND VERIFIED WITH: R RUMBARGER,PHARMD AT 0914 03/05/17 BY M VESTAL    Serratia marcescens NOT DETECTED NOT DETECTED Final   Carbapenem resistance NOT DETECTED NOT DETECTED Final   Haemophilus influenzae NOT DETECTED NOT DETECTED Final   Neisseria meningitidis NOT DETECTED NOT DETECTED Final   Pseudomonas aeruginosa NOT DETECTED NOT DETECTED Final   Candida albicans NOT DETECTED NOT DETECTED Final   Candida glabrata NOT DETECTED NOT DETECTED Final   Candida krusei NOT DETECTED NOT DETECTED Final   Candida parapsilosis NOT DETECTED NOT DETECTED Final   Candida tropicalis NOT DETECTED NOT DETECTED Final  Comment: Performed at [facility], [place] [address] [place], [place] [address]  Respiratory Panel by PCR     Status: None   Collection Time: 03/04/17  2:56 PM  Result Value Ref Range Status   Adenovirus NOT DETECTED NOT DETECTED Final   Coronavirus 229E NOT DETECTED NOT DETECTED Final   Coronavirus HKU1 NOT DETECTED NOT DETECTED Final   Coronavirus NL63 NOT DETECTED NOT DETECTED Final   Coronavirus OC43 NOT DETECTED NOT DETECTED Final   Metapneumovirus NOT DETECTED NOT DETECTED Final   Rhinovirus / Enterovirus NOT DETECTED NOT DETECTED Final   Influenza A NOT DETECTED NOT DETECTED Final   Influenza B NOT DETECTED NOT DETECTED Final   Parainfluenza Virus 1 NOT DETECTED NOT DETECTED Final   Parainfluenza Virus 2 NOT DETECTED NOT DETECTED Final   Parainfluenza Virus 3 NOT DETECTED NOT DETECTED Final   Parainfluenza Virus 4 NOT DETECTED NOT DETECTED Final   Respiratory Syncytial Virus NOT DETECTED NOT DETECTED Final   Bordetella  pertussis NOT DETECTED NOT DETECTED Final   Chlamydophila pneumoniae NOT DETECTED NOT DETECTED Final   Mycoplasma pneumoniae NOT DETECTED NOT DETECTED Final    Comment: Performed at [facility], [place] [address], [place], [place] [address]  MRSA PCR Screening     Status: None   Collection Time: 03/04/17  9:50 PM  Result Value Ref Range Status   MRSA by PCR NEGATIVE NEGATIVE Final    Comment:        The GeneXpert MRSA Assay (FDA approved for NASAL specimens only), is one component of a comprehensive MRSA colonization surveillance program. It is not intended to diagnose MRSA infection nor to guide or monitor treatment for MRSA infections.   Urine culture     Status: None   Collection Time: 03/07/17  1:10 PM  Result Value Ref Range Status   Specimen Description URINE, CATHETERIZED  Final   Special Requests NONE  Final   Culture NO GROWTH  Final   Report Status 03/08/2017 FINAL  Final  Surgical pcr screen     Status: None   Collection Time: 03/10/17  3:34 PM  Result Value Ref Range Status   MRSA, PCR NEGATIVE NEGATIVE Final   Staphylococcus aureus NEGATIVE NEGATIVE Final    Comment:        The Xpert SA Assay (FDA approved for NASAL specimens in patients over 66 years of age), is one component of a comprehensive surveillance program.  Test performance has been validated by [facility] for patients greater than or equal to 46 year old. It is not intended to diagnose infection nor to guide or monitor treatment.   Aerobic/Anaerobic Culture (surgical/deep wound)     Status: Abnormal (Preliminary result)   Collection Time: 03/11/17 12:32 PM  Result Value Ref Range Status   Specimen Description ABSCESS SCROTUM  Final   Special Requests NONE  Final   Gram Stain   Final    RARE WBC PRESENT, PREDOMINANTLY PMN NO ORGANISMS SEEN    Culture (A)  Final    MULTIPLE ORGANISMS PRESENT, NONE PREDOMINANT NO ANAEROBES ISOLATED; CULTURE IN PROGRESS FOR 5 DAYS    Report Status  PENDING  Incomplete    Radiology Reports Dg Chest 2 View  Result Date: 03/04/2017 CLINICAL DATA:  Increased shortness of breath and decreased O2 saturations. EXAM: CHEST  2 VIEW COMPARISON:  01/07/2014 FINDINGS: Azygos shadow and central vascular structures are mildly prominent. Findings could be related to vascular congestion. There is no frank pulmonary  edema. Heart size is within limits. No pleural effusions. No acute bone abnormality. Negative for a pneumothorax. IMPRESSION: Probable vascular congestion without overt pulmonary edema. Electronically Signed   By: Markus Daft M.D.   On: 03/04/2017 09:25   Ct Angio Chest Pe W/cm &/or Wo Cm  Result Date: 03/04/2017 CLINICAL DATA:  61 year old male with increased shortness of breath and hypoxia following dialysis earlier today. EXAM: CT ANGIOGRAPHY CHEST WITH CONTRAST TECHNIQUE: Multidetector CT imaging of the chest was performed using the standard protocol during bolus administration of intravenous contrast. Multiplanar CT image reconstructions and MIPs were obtained to evaluate the vascular anatomy. CONTRAST:  100 mL Isovue 370 COMPARISON:  Chest x-ray 03/04/2017; prior CT scan of the chest 07/09/2014 FINDINGS: Cardiovascular: Limited opacification of the pulmonary arteries secondary to transient physiologic interruption of contrast. No central or proximal or lobar PE. No evidence of aortic dissection or aneurysm. Aberrant right subclavian artery noticed incidentally. The heart is enlarged. No pericardial effusion. Mediastinum/Nodes: Unremarkable CT appearance of the thyroid gland. No suspicious mediastinal or hilar adenopathy. No soft tissue mediastinal mass. The thoracic esophagus is unremarkable. Lungs/Pleura: Respiratory motion artifact limits evaluation for small pulmonary nodules. No focal airspace consolidation, pleural effusion, pneumothorax or pulmonary edema. Mild dependent atelectasis present in both lower lobes. Upper Abdomen: Although the liver  is incompletely evaluated, the left hepatic lobe and caudate lobe appear relatively hypertrophied which may suggest early cirrhotic change. No acute abnormalities within the upper abdomen. Musculoskeletal: No acute fracture or aggressive appearing lytic or blastic osseous lesion. Review of the MIP images confirms the above findings. IMPRESSION: 1. No evidence of acute pulmonary embolus to the level of the proximal lobar arteries. Evaluation of the more distal pulmonary arterial tree is nondiagnostic secondary to a combination of relatively poor vessel opacification and respiratory motion artifact. 2. No evidence of pneumonia or other acute cardiopulmonary process. 3. Bibasilar subsegmental atelectasis. 4. Aberrant right subclavian artery. 5. Although incompletely imaged, the morphology of the liver suggests the possibility of cirrhotic change. Does the patient have clinical signs/symptoms of cirrhosis? Electronically Signed   By: Jacqulynn Cadet M.D.   On: 03/04/2017 12:31   US Scrotum  Result Date: 03/04/2017 CLINICAL DATA:  Scrotal pain and swelling. EXAM: SCROTAL ULTRASOUND DOPPLER ULTRASOUND OF THE TESTICLES TECHNIQUE: Complete ultrasound examination of the testicles, epididymis, and other scrotal structures was performed. Color and spectral Doppler ultrasound were also utilized to evaluate blood flow to the testicles. COMPARISON:  No recent prior. FINDINGS: Right testicle Measurements: 3.4 x 2.0 x 2.5 cm. No mass or microlithiasis visualized. Left testicle Measurements: 3.7 x 2.4 x 2.8 cm. 0.6 x 0.3 x 0.4 small area of increased echogenicity noted in the left testicle. Although this may represent a focal area of fat or a at left testicular tumor cannot be completely excluded. No microlithiasis visualized . Right epididymis:  Tail of the epididymis is prominent. Left epididymis: Prominent epididymis with heterogeneous echotexture. 7 mm epididymal cyst. Hydrocele:  None visualized. Varicocele: Could not  perform Valsalva maneuver due to patient's clinical condition. Bilateral scrotal skin thickening. Pulsed Doppler interrogation of both testes demonstrates normal low resistance arterial and venous waveforms bilaterally. IMPRESSION: 1. Cannot exclude bilateral epididymitis. 2. Small 0.6 cm area of increased echogenicity noted in the left testicle. Although this may represent small area of fat a small left testicular tumor cannot be excluded no evidence of torsion. Electronically Signed   By: Marcello Moores  Register   On: 03/04/2017 11:32   Ct Abdomen Pelvis W Contrast  Result Date: 03/06/2017 CLINICAL DATA:  Scrotal pain and swelling for about a week. Question abscess. EXAM: CT ABDOMEN AND PELVIS WITH CONTRAST TECHNIQUE: Multidetector CT imaging of the abdomen and pelvis was performed using the standard protocol following bolus administration of intravenous contrast. CONTRAST:  12mL ISOVUE-300 IOPAMIDOL (ISOVUE-300) INJECTION 61% COMPARISON:  Ultrasound exam from 03/04/2017. FINDINGS: Lower chest: Dependent atelectasis in the lower lobes. Hepatobiliary: The liver shows diffusely decreased attenuation suggesting steatosis. Gallbladder decompressed. No intrahepatic or extrahepatic biliary dilation. Pancreas: No focal mass lesion. No dilatation of the main duct. No intraparenchymal cyst. No peripancreatic edema. Spleen: No splenomegaly. No focal mass lesion. Adrenals/Urinary Tract: Bilateral adrenal thickening without nodule or mass. 14 mm hyperattenuating lesion is identified in the interpolar right kidney (image 45 series 9). No suspicious mass lesion in the left kidney. No evidence for hydroureter. Bladder wall appears thickened although bladder is decompressed. Stomach/Bowel: Stomach is nondistended. No gastric wall thickening. No evidence of outlet obstruction. Duodenum is normally positioned as is the ligament of Treitz. No small bowel wall thickening. No small bowel dilatation. The terminal ileum is normal. The  appendix is not visualized, but there is no edema or inflammation in the region of the cecum. Diverticular changes are noted in the left colon without evidence of diverticulitis. Vascular/Lymphatic: There is abdominal aortic atherosclerosis without aneurysm. 13 mm short axis hepatoduodenal ligament lymph node is borderline to mildly enlarged. No retroperitoneal lymphadenopathy. No pelvic sidewall lymphadenopathy. Reproductive: Prostate gland unremarkable. Marked skin thickening and subcutaneous edema noted in the penis and scrotum. No focal or rim enhancing fluid collection is identified to suggest abscess. No evidence for soft tissue gas in the tissues of the perineum. Other: No intraperitoneal free fluid. Musculoskeletal: Bone windows reveal no worrisome lytic or sclerotic osseous lesions. IMPRESSION: 1. Marked skin thickening and subcutaneous edema in the soft tissues of the penis and scrotum. No focal or rim enhancing fluid collection to suggest abscess. No gas in the scrotum or perineum to suggest Fournier's gangrene. 2. 14 mm hyperattenuating lesion interpolar right kidney could represent neoplasm. MRI without and with contrast recommended to further evaluate. 3. 13 mm short axis hepatoduodenal ligament lymph node upper normal to borderline increased but nonspecific. 4. Bilateral lower lobe atelectasis. 5.  Abdominal Aortic Atherosclerois (ICD10-170.0) 6. Fatty liver with asymmetric enlargement of left hepatic lobe. No contour nodularity to suggest overt cirrhosis. Electronically Signed   By: Misty Stanley M.D.   On: 03/06/2017 16:26   [place] Art/ven Flow Abd Pelv Doppler  Result Date: 03/04/2017 CLINICAL DATA:  Scrotal pain and swelling. EXAM: SCROTAL ULTRASOUND DOPPLER ULTRASOUND OF THE TESTICLES TECHNIQUE: Complete ultrasound examination of the testicles, epididymis, and other scrotal structures was performed. Color and spectral Doppler ultrasound were also utilized to evaluate blood flow to the testicles.  COMPARISON:  No recent prior. FINDINGS: Right testicle Measurements: 3.4 x 2.0 x 2.5 cm. No mass or microlithiasis visualized. Left testicle Measurements: 3.7 x 2.4 x 2.8 cm. 0.6 x 0.3 x 0.4 small area of increased echogenicity noted in the left testicle. Although this may represent a focal area of fat or a at left testicular tumor cannot be completely excluded. No microlithiasis visualized . Right epididymis:  Tail of the epididymis is prominent. Left epididymis: Prominent epididymis with heterogeneous echotexture. 7 mm epididymal cyst. Hydrocele:  None visualized. Varicocele: Could not perform Valsalva maneuver due to patient's clinical condition. Bilateral scrotal skin thickening. Pulsed Doppler interrogation of both testes demonstrates normal low resistance arterial and venous waveforms bilaterally. IMPRESSION: 1.  Cannot exclude bilateral epididymitis. 2. Small 0.6 cm area of increased echogenicity noted in the left testicle. Although this may represent small area of fat a small left testicular tumor cannot be excluded no evidence of torsion. Electronically Signed   By: Marcello Moores  Register   On: 03/04/2017 11:32   Dg Chest Port 1 View  Result Date: 03/07/2017 CLINICAL DATA:  Dialysis, shortness of breath EXAM: PORTABLE CHEST 1 VIEW COMPARISON:  CT chest 03/04/2017 FINDINGS: There is no focal parenchymal opacity. There is no pleural effusion or pneumothorax. There is stable cardiomegaly. The osseous structures are unremarkable. IMPRESSION: No active disease. Electronically Signed   By: Kathreen Devoid   On: 03/07/2017 09:24    Lab Data:  CBC:  Recent Labs Lab 03/07/17 0555 03/08/17 0259 03/09/17 0717 03/10/17 0542  WBC 14.2* 16.9* 19.9* 23.1*  NEUTROABS  --   --  14.9* 16.2*  HGB 10.1* 9.7* 10.1* 9.9*  HCT 31.3* 30.6* 31.6* 30.7*  MCV 75.1* 75.6* 76.0* 75.8*  PLT 132* 166 191 937   Basic Metabolic Panel:  Recent Labs Lab 03/09/17 0717 03/10/17 0542 03/11/17 0631 03/12/17 0342  03/13/17 0457  NA 130* 129* 131* 131* 131*  K 3.4* 3.8 3.4* 3.5 3.6  CL 92* 92* 94* 95* 96*  CO2 23 21* 22 24 21*  GLUCOSE 220* 84 189* 211* 133*  BUN 41* 52* 38* 25* 35*  CREATININE 10.36* 12.52* 10.28* 8.26* 11.16*  CALCIUM 9.1 8.8* 8.8* 8.3* 8.6*  PHOS 5.2* 6.6* 5.4* 5.1* 7.3*   GFR: Estimated Creatinine Clearance: 10.9 mL/min (A) (by C-G formula based on SCr of 11.16 mg/dL (H)). Liver Function Tests:  Recent Labs Lab 03/09/17 0717 03/10/17 0542 03/11/17 0631 03/12/17 0342 03/13/17 0457  ALBUMIN 2.0* 2.0* 2.1* 2.0* 2.1*   No results for input(s): LIPASE, AMYLASE in the last 168 hours. No results for input(s): AMMONIA in the last 168 hours. Coagulation Profile: No results for input(s): INR, PROTIME in the last 168 hours. Cardiac Enzymes: No results for input(s): CKTOTAL, CKMB, CKMBINDEX, TROPONINI in the last 168 hours. BNP (last 3 results) No results for input(s): PROBNP in the last 8760 hours. HbA1C: No results for input(s): HGBA1C in the last 72 hours. CBG:  Recent Labs Lab 03/12/17 0823 03/12/17 1223 03/12/17 1653 03/12/17 2307 03/13/17 0828  GLUCAP 203* 169* 86 169* 223*   Lipid Profile: No results for input(s): CHOL, HDL, LDLCALC, TRIG, CHOLHDL, LDLDIRECT in the last 72 hours. Thyroid Function Tests: No results for input(s): TSH, T4TOTAL, FREET4, T3FREE, THYROIDAB in the last 72 hours. Anemia Panel: No results for input(s): VITAMINB12, FOLATE, FERRITIN, TIBC, IRON, RETICCTPCT in the last 72 hours. Urine analysis:    Component Value Date/Time   COLORURINE YELLOW 01/10/2012 1404   APPEARANCEUR CLEAR 01/10/2012 1404   LABSPEC 1.023 01/10/2012 1404   PHURINE 6.0 01/10/2012 1404   GLUCOSEU >1000 (A) 01/10/2012 1404   HGBUR TRACE (A) 01/10/2012 1404   BILIRUBINUR NEGATIVE 01/10/2012 1404   KETONESUR NEGATIVE 01/10/2012 1404   PROTEINUR >300 (A) 01/10/2012 1404   UROBILINOGEN 1.0 01/10/2012 1404   NITRITE NEGATIVE 01/10/2012 1404   LEUKOCYTESUR  NEGATIVE 01/10/2012 1404     Rayli Wiederhold M.D. [facility] 03/13/2017, 11:50 AM  Pager: [phone] Between 7am to 7pm - call Pager - [phone]  After 7pm go to [email or web address] - password [email or web address]  Call night coverage person covering after 7pm

## 2017-03-13 NOTE — Progress Notes (Signed)
  [facility] Progress Note   Subjective:  Seen in room. No CP or dyspnea. No new complaints. Vascular surgery unable to do fistulogram until early next week (should not hold up discharge as this can be done as outpatient).  Objective Vitals:   03/12/17 2304 03/13/17 0135 03/13/17 0422 03/13/17 0500  BP: 115/74  126/64   Pulse: 82 89 88   Resp: 18 20 20    Temp: 98.4 F (36.9 C)  97.5 F (36.4 C)   TempSrc: Oral  Axillary   SpO2: 95% 97% 96%   Weight: (!) 154.3 kg (340 lb 2.7 oz)   (!) 154.3 kg (340 lb 2.7 oz)  Height:       Physical Exam General: Well appearing, obese man, NAD. Heart: RRR; no murmur. Lungs: CTAB; no rales Extremities: 1+ LE edema Dialysis Access: R forearm AVF + thrill  Additional Objective Labs: Basic Metabolic Panel:  Recent Labs Lab 03/11/17 0631 03/12/17 0342 03/13/17 0457  NA 131* 131* 131*  K 3.4* 3.5 3.6  CL 94* 95* 96*  CO2 22 24 21*  GLUCOSE 189* 211* 133*  BUN 38* 25* 35*  CREATININE 10.28* 8.26* 11.16*  CALCIUM 8.8* 8.3* 8.6*  PHOS 5.4* 5.1* 7.3*   Liver Function Tests:  Recent Labs Lab 03/11/17 0631 03/12/17 0342 03/13/17 0457  ALBUMIN 2.1* 2.0* 2.1*   CBC:  Recent Labs Lab 03/07/17 0555 03/08/17 0259 03/09/17 0717 03/10/17 0542  WBC 14.2* 16.9* 19.9* 23.1*  NEUTROABS  --   --  14.9* 16.2*  HGB 10.1* 9.7* 10.1* 9.9*  HCT 31.3* 30.6* 31.6* 30.7*  MCV 75.1* 75.6* 76.0* 75.8*  PLT 132* 166 191 249  CBG:  Recent Labs Lab 03/12/17 0823 03/12/17 1223 03/12/17 1653 03/12/17 2307 03/13/17 0828  GLUCAP 203* 169* 86 169* 223*   Medications: . sodium chloride 10 mL/hr at 03/11/17 1115  .  ceFAZolin (ANCEF) IV Stopped (03/11/17 2145)  . vancomycin Stopped (03/11/17 2035)   . allopurinol  100 mg Oral Daily  . aspirin EC  81 mg Oral Daily  . calcitRIOL  3.5 mcg Oral Q M,W,F-HD  . cinacalcet  60 mg Oral Q breakfast  . darbepoetin (ARANESP) injection - DIALYSIS  40 mcg Intravenous Q Mon-HD  . ferric  citrate  630 mg Oral TID WC  . gabapentin  200 mg Oral BID  . insulin aspart  0-9 Units Subcutaneous TID WC  . insulin aspart  10 Units Subcutaneous TID WC  . insulin detemir  20 Units Subcutaneous BID  . pravastatin  40 mg Oral QHS    Dialysis Orders: MWF [facility] 4hr 69min EDW 156 kg 200 dialyzer 2k/ 2ca bath  - Heparin 10,000 bolus , 2000 units intermit.  - Calcitrol 3.5 mcg q hd  - Venofer 50 mg q weekly   Assessment/Plan: 1. Scrotal abscess/sepsis: +Proteus bacteremia (1/2 blood culture) but negative urine cultures. S/p I&D/debridement of necrotic area over the posterior scrotum 4/25 and 4/27. WBC rising. On IV Vanc/Cefazolin, needs 2 week course of abx with HD. 2. ESRD: Continue MWF schedule, next due 4/30. EDW will be changed on d/c, now ~153kg. 3. Anemia: Hgb 9.9, continue ESA . No indications for transfusion. 4. CKD-MBDPhos better, continue Auryxia as binder. Continue Sensipar and calcitriol for PTH suppression. 5. Hypertension: BP fine, continue to UF as tolerated. 6. DM: On insulin, per primary.  Veneta Penton, PA-C 03/13/2017, 9:21 AM  [facility] Pager: [phone]

## 2017-03-14 ENCOUNTER — Encounter (HOSPITAL_COMMUNITY): Payer: Self-pay | Admitting: Urology

## 2017-03-14 DIAGNOSIS — E1129 Type 2 diabetes mellitus with other diabetic kidney complication: Secondary | ICD-10-CM | POA: Diagnosis not present

## 2017-03-14 DIAGNOSIS — N186 End stage renal disease: Secondary | ICD-10-CM | POA: Diagnosis not present

## 2017-03-14 DIAGNOSIS — R197 Diarrhea, unspecified: Secondary | ICD-10-CM

## 2017-03-14 DIAGNOSIS — Z992 Dependence on renal dialysis: Secondary | ICD-10-CM | POA: Diagnosis not present

## 2017-03-14 DIAGNOSIS — K429 Umbilical hernia without obstruction or gangrene: Secondary | ICD-10-CM

## 2017-03-14 DIAGNOSIS — Z9889 Other specified postprocedural states: Secondary | ICD-10-CM

## 2017-03-14 LAB — RENAL FUNCTION PANEL
ALBUMIN: 2.1 g/dL — AB (ref 3.5–5.0)
Anion gap: 16 — ABNORMAL HIGH (ref 5–15)
BUN: 46 mg/dL — ABNORMAL HIGH (ref 6–20)
CO2: 18 mmol/L — ABNORMAL LOW (ref 22–32)
Calcium: 8.3 mg/dL — ABNORMAL LOW (ref 8.9–10.3)
Chloride: 94 mmol/L — ABNORMAL LOW (ref 101–111)
Creatinine, Ser: 13.61 mg/dL — ABNORMAL HIGH (ref 0.61–1.24)
GFR, EST AFRICAN AMERICAN: 4 mL/min — AB (ref >60)
GFR, EST NON AFRICAN AMERICAN: 3 mL/min — AB (ref >60)
GLUCOSE: 143 mg/dL — AB (ref 65–99)
POTASSIUM: 3.9 mmol/L (ref 3.5–5.1)
Phosphorus: 8.1 mg/dL — ABNORMAL HIGH (ref 2.5–4.6)
SODIUM: 128 mmol/L — AB (ref 135–145)

## 2017-03-14 LAB — CBC
HEMATOCRIT: 30.9 % — AB (ref 39.0–52.0)
Hemoglobin: 9.8 g/dL — ABNORMAL LOW (ref 13.0–17.0)
MCH: 24.4 pg — ABNORMAL LOW (ref 26.0–34.0)
MCHC: 31.7 g/dL (ref 30.0–36.0)
MCV: 76.9 fL — ABNORMAL LOW (ref 78.0–100.0)
Platelets: 309 10*3/uL (ref 150–400)
RBC: 4.02 MIL/uL — AB (ref 4.22–5.81)
RDW: 17.1 % — ABNORMAL HIGH (ref 11.5–15.5)
WBC: 17.2 10*3/uL — AB (ref 4.0–10.5)

## 2017-03-14 LAB — GLUCOSE, CAPILLARY
Glucose-Capillary: 189 mg/dL — ABNORMAL HIGH (ref 65–99)
Glucose-Capillary: 179 mg/dL — ABNORMAL HIGH (ref 65–99)

## 2017-03-14 MED ORDER — CEFAZOLIN SODIUM-DEXTROSE 2-4 GM/100ML-% IV SOLN: 2.0000 g | INTRAVENOUS | Status: DC

## 2017-03-14 MED ORDER — HYDROCODONE-ACETAMINOPHEN 5-325 MG PO TABS: 1.0000 | ORAL_TABLET | ORAL | 0 refills | Status: DC | PRN

## 2017-03-14 MED ORDER — DARBEPOETIN ALFA 40 MCG/0.4ML IJ SOSY
PREFILLED_SYRINGE | INTRAMUSCULAR | Status: AC
  Filled 2017-03-14: qty 0.4

## 2017-03-14 MED ORDER — ONDANSETRON HCL 4 MG PO TABS: 4.0000 mg | ORAL_TABLET | Freq: Four times a day (QID) | ORAL | 0 refills | Status: DC | PRN

## 2017-03-14 MED ORDER — ALLOPURINOL 100 MG PO TABS: 100.0000 mg | ORAL_TABLET | Freq: Every day | ORAL | 2 refills | Status: DC

## 2017-03-14 MED ORDER — CALCITRIOL 0.5 MCG PO CAPS
ORAL_CAPSULE | ORAL | Status: AC
Start: 2017-03-14 — End: 2017-03-14
  Filled 2017-03-14: qty 7

## 2017-03-14 MED ORDER — ALLOPURINOL 100 MG PO TABS: 100.0000 mg | ORAL_TABLET | Freq: Every day | ORAL | 2 refills | Status: AC

## 2017-03-14 MED ORDER — ENOXAPARIN SODIUM 40 MG/0.4ML ~~LOC~~ SOLN
40.0000 mg | SUBCUTANEOUS | Status: DC
  Administered 2017-03-14: 40 mg via SUBCUTANEOUS
  Filled 2017-03-14 (×2): qty 0.4

## 2017-03-14 MED ORDER — NOVOLOG FLEXPEN 100 UNIT/ML ~~LOC~~ SOPN: 10.0000 [IU] | PEN_INJECTOR | Freq: Three times a day (TID) | SUBCUTANEOUS | 6 refills | Status: DC

## 2017-03-14 MED ORDER — HYDROCODONE-ACETAMINOPHEN 5-325 MG PO TABS: 1.0000 | ORAL_TABLET | Freq: Four times a day (QID) | ORAL | 0 refills | Status: DC | PRN

## 2017-03-14 MED ORDER — HEPARIN SODIUM (PORCINE) 1000 UNIT/ML DIALYSIS: 20.0000 [IU]/kg | INTRAMUSCULAR | Status: DC | PRN

## 2017-03-14 NOTE — Progress Notes (Signed)
Patient discharged to home with family. All scripts reviewed. Follow up appts reviewed. IV removed. Patient understands wound care instructions as ordered. Patient left the unit in stable condition.  Sheliah Plane RN

## 2017-03-14 NOTE — Progress Notes (Signed)
INFECTIOUS DISEASE PROGRESS NOTE  ID: [name] is a 61 y.o. male with  Principal Problem:   Acute respiratory failure with hypoxia ([place]) Active Problems:   Hypertension   Hyperlipidemia   Gout   Obesity   Diabetes mellitus (HCC)   Monoclonal gammopathies   End stage renal disease (HCC)   Scrotal swelling   ESRD (end stage renal disease) on dialysis (HCC)   Scrotal pain   Sepsis ([place])   Gram-neg septicemia ([place])   Scrotal edema  Subjective: [name] is a 61 y.o. male admitted from ED on 4-20 with hypotension s/p HD, SOB, N/V/D and scrotal pain x 1 week. IN ED was hypoxic, tachypneic, hypotensive; WBC 23.2, Temp 103.1. Found have proteus bacteremia (1/2 BCx on 4/20) and many organisms present on surgical wound culture 4/27 >> pending final report.   Antibiotics:  4/23 >> current - cefazolin  4/20 >>  current - vancomycin 4/20 >> 4/20 - zosyn 4/21 >> 4/23 - ceftriaxone  Reports he is feeling much better since admission. Breathing improved. Awaiting for scrotal packing to be changed today and reports "a lot of drainage still." Only complaint is mild scrotal/penile pain and diarrhea that he feels started with antibiotics "at least a week now." Reports 3 - 4 liquid-to-semiformed bowel movements a day.   Abtx:  Anti-infectives    Start     Dose/Rate Route Frequency Ordered Stop   03/11/17 1800  ceFAZolin (ANCEF) IVPB 2g/100 mL premix     2 g 200 mL/hr over 30 Minutes Intravenous Every M-W-F (1800) 03/11/17 1433     03/11/17 1530  vancomycin (VANCOCIN) IVPB 1000 mg/200 mL premix  Status:  Discontinued     1,000 mg 200 mL/hr over 60 Minutes Intravenous Every M-W-F (Hemodialysis) 03/11/17 1433 03/13/17 1230   03/11/17 1200  vancomycin (VANCOCIN) IVPB 1000 mg/200 mL premix  Status:  Discontinued     1,000 mg 200 mL/hr over 60 Minutes Intravenous Every M-W-F (Hemodialysis) 03/09/17 0935 03/10/17 1348   03/10/17 1430  ceFAZolin (ANCEF) IVPB 2g/100 mL premix     2  g 200 mL/hr over 30 Minutes Intravenous  Once 03/10/17 1342 03/10/17 1505   03/10/17 1430  vancomycin (VANCOCIN) IVPB 1000 mg/200 mL premix     1,000 mg 200 mL/hr over 60 Minutes Intravenous  Once 03/10/17 1345 03/10/17 1617   03/09/17 2000  ceFAZolin (ANCEF) IVPB 2g/100 mL premix  Status:  Discontinued     2 g 200 mL/hr over 30 Minutes Intravenous Every M-W-F (2000) 03/08/17 1118 03/09/17 0935   03/09/17 1800  ceFAZolin (ANCEF) IVPB 2g/100 mL premix  Status:  Discontinued     2 g 200 mL/hr over 30 Minutes Intravenous Every M-W-F (Hemodialysis) 03/07/17 1759 03/08/17 1118   03/09/17 1530  vancomycin (VANCOCIN) 1,500 mg in sodium chloride 0.9 % 500 mL IVPB     1,500 mg 250 mL/hr over 120 Minutes Intravenous  Once 03/09/17 1445 03/09/17 2048   03/09/17 1200  vancomycin (VANCOCIN) 1,500 mg in sodium chloride 0.9 % 500 mL IVPB  Status:  Discontinued     1,500 mg 250 mL/hr over 120 Minutes Intravenous Every Wed (Hemodialysis) 03/09/17 1011 03/09/17 1445   03/08/17 2000  ceFAZolin (ANCEF) IVPB 2g/100 mL premix     2 g 200 mL/hr over 30 Minutes Intravenous  Once 03/08/17 1118 03/08/17 2114   03/07/17 1830  ceFAZolin (ANCEF) IVPB 2g/100 mL premix     2 g 200 mL/hr over 30 Minutes Intravenous  Once 03/07/17 1758 03/07/17 2056   03/05/17 1800  vancomycin (VANCOCIN) IVPB 750 mg/150 ml premix     750 mg 150 mL/hr over 60 Minutes Intravenous Every T-Th-Sa (Hemodialysis) 03/05/17 1341 03/05/17 2020   03/05/17 1500  cefTRIAXone (ROCEPHIN) 2 g in dextrose 5 % 50 mL IVPB  Status:  Discontinued     2 g 100 mL/hr over 30 Minutes Intravenous Every 24 hours 03/05/17 1427 03/07/17 1753   03/04/17 2200  piperacillin-tazobactam (ZOSYN) IVPB 3.375 g  Status:  Discontinued     3.375 g 12.5 mL/hr over 240 Minutes Intravenous Every 12 hours 03/04/17 1318 03/05/17 1427   03/04/17 1030  vancomycin (VANCOCIN) 2,500 mg in sodium chloride 0.9 % 500 mL IVPB     2,500 mg 250 mL/hr over 120 Minutes Intravenous  Once  03/04/17 1016 03/04/17 1339   03/04/17 1015  piperacillin-tazobactam (ZOSYN) IVPB 3.375 g     3.375 g 100 mL/hr over 30 Minutes Intravenous  Once 03/04/17 1007 03/04/17 1114   03/04/17 1015  vancomycin (VANCOCIN) IVPB 1000 mg/200 mL premix  Status:  Discontinued     1,000 mg 200 mL/hr over 60 Minutes Intravenous  Once 03/04/17 1007 03/04/17 1015      Medications:  Scheduled: . allopurinol  100 mg Oral Daily  . aspirin EC  81 mg Oral Daily  . calcitRIOL  3.5 mcg Oral Q M,W,F-HD  . cinacalcet  60 mg Oral Q breakfast  . darbepoetin (ARANESP) injection - DIALYSIS  40 mcg Intravenous Q Mon-HD  . enoxaparin (LOVENOX) injection  40 mg Subcutaneous Q24H  . ferric citrate  630 mg Oral TID WC  . gabapentin  200 mg Oral BID  . insulin aspart  0-9 Units Subcutaneous TID WC  . insulin aspart  10 Units Subcutaneous TID WC  . insulin detemir  20 Units Subcutaneous BID  . pravastatin  40 mg Oral QHS    Objective: Vital signs in last 24 hours: Temp:  [97.8 F (36.6 C)-98.9 F (37.2 C)] 97.8 F (36.6 C) (04/30 1206) Pulse Rate:  [61-88] 82 (04/30 1206) Resp:  [14-18] 16 (04/30 1206) BP: (94-143)/(54-75) 127/66 (04/30 1206) SpO2:  [91 %-100 %] 100 % (04/30 1206) Weight:  [335 lb 1.6 oz (152 kg)-341 lb 11.4 oz (155 kg)] 335 lb 1.6 oz (152 kg) (04/30 1142)   General appearance: alert, cooperative, no distress and Lying flat in bed  Resp: diminished breath sounds bibasilar Cardio: regular rate and rhythm, S1, S2 normal, no murmur, click, rub or gallop GI: abnormal findings:  distended, obese and umbilical hernia and non-tender Extremities: extremities normal, atraumatic, no cyanosis or edema Neurologic: Alert and oriented X 3, normal strength and tone. Normal symmetric reflexes. Normal coordination and gait Mental status: Alert, oriented, thought content appropriate Incision/Wound: L upper scrotal surgical incision with saturated packing. Serosang, non-purulent drainage present. No odor.  Scattered partial thickness wounds to scrotum with xeroform gauze.   Lab Results  Recent Labs  03/13/17 0457 03/14/17 0444  WBC  --  17.2*  HGB  --  9.8*  HCT  --  30.9*  NA 131* 128*  K 3.6 3.9  CL 96* 94*  CO2 21* 18*  BUN 35* 46*  CREATININE 11.16* 13.61*   Liver Panel  Recent Labs  03/13/17 0457 03/14/17 0444  ALBUMIN 2.1* 2.1*   Sedimentation Rate No results for input(s): ESRSEDRATE in the last 72 hours. C-Reactive Protein No results for input(s): CRP in the last 72 hours.  Microbiology: Recent Results (  from the past 240 hour(s))  Respiratory Panel by PCR     Status: None   Collection Time: 03/04/17  2:56 PM  Result Value Ref Range Status   Adenovirus NOT DETECTED NOT DETECTED Final   Coronavirus 229E NOT DETECTED NOT DETECTED Final   Coronavirus HKU1 NOT DETECTED NOT DETECTED Final   Coronavirus NL63 NOT DETECTED NOT DETECTED Final   Coronavirus OC43 NOT DETECTED NOT DETECTED Final   Metapneumovirus NOT DETECTED NOT DETECTED Final   Rhinovirus / Enterovirus NOT DETECTED NOT DETECTED Final   Influenza A NOT DETECTED NOT DETECTED Final   Influenza B NOT DETECTED NOT DETECTED Final   Parainfluenza Virus 1 NOT DETECTED NOT DETECTED Final   Parainfluenza Virus 2 NOT DETECTED NOT DETECTED Final   Parainfluenza Virus 3 NOT DETECTED NOT DETECTED Final   Parainfluenza Virus 4 NOT DETECTED NOT DETECTED Final   Respiratory Syncytial Virus NOT DETECTED NOT DETECTED Final   Bordetella pertussis NOT DETECTED NOT DETECTED Final   Chlamydophila pneumoniae NOT DETECTED NOT DETECTED Final   Mycoplasma pneumoniae NOT DETECTED NOT DETECTED Final    Comment: Performed at [facility], [place]. [address], [place],  [place] [address]  MRSA PCR Screening     Status: None   Collection Time: 03/04/17  9:50 PM  Result Value Ref Range Status   MRSA by PCR NEGATIVE NEGATIVE Final    Comment:        The GeneXpert MRSA Assay (FDA approved for NASAL specimens only), is one  component of a comprehensive MRSA colonization surveillance program. It is not intended to diagnose MRSA infection nor to guide or monitor treatment for MRSA infections.   Urine culture     Status: None   Collection Time: 03/07/17  1:10 PM  Result Value Ref Range Status   Specimen Description URINE, CATHETERIZED  Final   Special Requests NONE  Final   Culture NO GROWTH  Final   Report Status 03/08/2017 FINAL  Final  Surgical pcr screen     Status: None   Collection Time: 03/10/17  3:34 PM  Result Value Ref Range Status   MRSA, PCR NEGATIVE NEGATIVE Final   Staphylococcus aureus NEGATIVE NEGATIVE Final    Comment:        The Xpert SA Assay (FDA approved for NASAL specimens in patients over 42 years of age), is one component of a comprehensive surveillance program.  Test performance has been validated by [facility] for patients greater than or equal to 36 year old. It is not intended to diagnose infection nor to guide or monitor treatment.   Aerobic/Anaerobic Culture (surgical/deep wound)     Status: Abnormal (Preliminary result)   Collection Time: 03/11/17 12:32 PM  Result Value Ref Range Status   Specimen Description ABSCESS SCROTUM  Final   Special Requests NONE  Final   Gram Stain   Final    RARE WBC PRESENT, PREDOMINANTLY PMN NO ORGANISMS SEEN    Culture (A)  Final    MULTIPLE ORGANISMS PRESENT, NONE PREDOMINANT NO ANAEROBES ISOLATED; CULTURE IN PROGRESS FOR 5 DAYS    Report Status PENDING  Incomplete    Studies/Results: No results found.   Assessment/Plan: Scrotal cellulitis   -No Fourniere's gangrene or abscess on CT >> 4/22  -s/p I&D >> 4/25, 4/27   -Await final cultures - no anaerobes identified on preliminary report.   -Continue current plan with clinical improvement, no fevers and WBC trending down.   -D/C Vancomycin if MRSA neg   -  Per urology re: wound care   Proteus Bacteremia   -Cefazolin : day 10, getting with HD.   -Stop Date: 14 days  post d/c.    Diarrhea   -Worry about contamination of scrotal wounds/incisions.   Encouraged pt to keep scrotum elevated.           Janene Madeira, MSN, NP-C [facility] Cell: [phone]  Reviewed, confirmed [name]  03/14/2017, 2:11 PM  LOS: 10 days

## 2017-03-14 NOTE — Progress Notes (Signed)
  [facility] Progress Note   Subjective: doing well, no HD now  Vitals:   03/14/17 1000 03/14/17 1030 03/14/17 1100 03/14/17 1115  BP: (!) 94/54 124/62 121/72 (!) 143/71  Pulse: 87 88 86 82  Resp:      Temp:      TempSrc:      SpO2:      Weight:      Height:        Inpatient medications: . allopurinol  100 mg Oral Daily  . aspirin EC  81 mg Oral Daily  . calcitRIOL  3.5 mcg Oral Q M,W,F-HD  . cinacalcet  60 mg Oral Q breakfast  . darbepoetin (ARANESP) injection - DIALYSIS  40 mcg Intravenous Q Mon-HD  . ferric citrate  630 mg Oral TID WC  . gabapentin  200 mg Oral BID  . insulin aspart  0-9 Units Subcutaneous TID WC  . insulin aspart  10 Units Subcutaneous TID WC  . insulin detemir  20 Units Subcutaneous BID  . pravastatin  40 mg Oral QHS   . sodium chloride 10 mL/hr at 03/11/17 1115  .  ceFAZolin (ANCEF) IV Stopped (03/11/17 2145)   acetaminophen **OR** acetaminophen, calcium carbonate (dosed in mg elemental calcium), camphor-menthol **AND** hydrOXYzine, chlorproMAZINE, docusate sodium, feeding supplement (NEPRO CARB STEADY), heparin, HYDROcodone-acetaminophen, ondansetron **OR** ondansetron (ZOFRAN) IV, sorbitol, zolpidem  Exam: General: Well appearing, obese man, NAD. Heart: RRR; no murmur. Lungs: CTAB; no rales Extremities: 1+ LE edema Dialysis Access: R forearm AVF + thrill   Dialysis: [facility]  4h 78min  156 kg   200 dialyzer   2k/ 2ca bath  R arm AVF  Hep 10,000 bolus + 2000 midrun prn - Calcitrol 3.5 mcg q hd  - Venofer 50 mg q weekly   Assessment: 1. Scrotal abscess/sepsis: +Proteus bacteremia (1/2 blood culture). S/p I&D/debridement of necrotic posterior left scrotum on 4/25 and4/27. WBC down. Per ID needs 2 wks of Cefazolin, which would be through 03/25/17.   2. ESRD: cont MWF, drop dry wt to 153kg approx 3. Anemia: Hgb 9.9, continue ESA . No indications for transfusion. 4. CKD-MBDPhos better, continue Auryxia as binder. Continue  Sensipar and calcitriol for PTH suppression. 5. Hypertension: BP fine, continue to UF as tolerated. 6. DM: On insulin, per primary.   Plan - HD today, lower dry wt at dc.  IV Ancef thru 5/11.    Kelly Splinter MD [facility] pager [phone]   03/14/2017, 11:17 AM    Recent Labs Lab 03/12/17 0342 03/13/17 0457 03/14/17 0444  NA 131* 131* 128*  K 3.5 3.6 3.9  CL 95* 96* 94*  CO2 24 21* 18*  GLUCOSE 211* 133* 143*  BUN 25* 35* 46*  CREATININE 8.26* 11.16* 13.61*  CALCIUM 8.3* 8.6* 8.3*  PHOS 5.1* 7.3* 8.1*    Recent Labs Lab 03/12/17 0342 03/13/17 0457 03/14/17 0444  ALBUMIN 2.0* 2.1* 2.1*    Recent Labs Lab 03/09/17 0717 03/10/17 0542 03/14/17 0444  WBC 19.9* 23.1* 17.2*  NEUTROABS 14.9* 16.2*  --   HGB 10.1* 9.9* 9.8*  HCT 31.6* 30.7* 30.9*  MCV 76.0* 75.8* 76.9*  PLT 191 249 309   Iron/TIBC/Ferritin/ %Sat    Component Value Date/Time   IRON 30 (L) 01/05/2014 1225   TIBC 240 01/05/2014 1225   FERRITIN 98 01/05/2014 1225   IRONPCTSAT 13 (L) 01/05/2014 1225

## 2017-03-14 NOTE — Anesthesia Postprocedure Evaluation (Signed)
Anesthesia Post Note  Patient: [name]  Procedure(s) Performed: Procedure(s) (LRB): SCROTUM EXPLORATION AND DEBRIDEMENT (N/A)  Patient location during evaluation: PACU Anesthesia Type: General Level of consciousness: awake and alert and patient cooperative Pain management: pain level controlled Vital Signs Assessment: post-procedure vital signs reviewed and stable Respiratory status: spontaneous breathing and respiratory function stable Cardiovascular status: stable Anesthetic complications: no       Last Vitals:  Vitals:   03/14/17 0830 03/14/17 0900  BP: 118/65 126/68  Pulse: 71 81  Resp:    Temp:      Last Pain:  Vitals:   03/14/17 0830  TempSrc:   PainSc: [place]

## 2017-03-14 NOTE — Discharge Summary (Signed)
Physician Discharge Summary   Patient ID: [name] MRN: [id number] DOB/AGE: 1956-01-07 61 y.o.  Admit date: 03/04/2017 Discharge date: 03/14/2017  Primary Care Physician:  Gara Kroner, MD  Discharge Diagnoses:    . Acute respiratory failure with hypoxia (HCC) Proteus bacteremia with sepsis Scrotal abscess . Hypertension . End stage renal disease (HCC) on hemodialysis MWF . Morbid Obesity, BMI 46.7 . Gout . Monoclonal gammopathies . Hyperlipidemia Obstructive sleep apnea on CPAP History of Hepatitis C, treated   Consults: Nephrology Infectious disease Urology Interventional radiology Pulmonology  Recommendations for Outpatient Follow-up:  1. Patient is recommended to continue IV Ancef 2 g every Monday Wednesday Friday with hemodialysis 2. Follow final cultures of the scrotal abscess from I&D 3. WOUND CARE: posterior scrotal wound, change the dressing every 8 hours, using sterile gauze wet-to-dry with normal saline. Place mesh underpants to keep the dressing in place.   DIET: Carb modified diet    Allergies:   Allergies  Allergen Reactions  . Penicillins Nausea And Vomiting and Other (See Comments)    Has patient had a PCN reaction causing immediate rash, facial/tongue/throat swelling, SOB or lightheadedness with hypotension: No Has patient had a PCN reaction causing severe rash involving mucus membranes or skin necrosis: No Has patient had a PCN reaction that required hospitalization No Has patient had a PCN reaction occurring within the last 10 years: No If all of the above answers are "NO", then may proceed with Cephalosporin use.  . Vioxx [Rofecoxib] Nausea And Vomiting     DISCHARGE MEDICATIONS: Current Discharge Medication List    START taking these medications   Details  ceFAZolin (ANCEF) 2-4 GM/100ML-% IVPB Inject 100 mLs (2 g total) into the vein every Monday, Wednesday, and Friday. For 2 more weeks Qty: 1 each    HYDROcodone-acetaminophen  (NORCO/VICODIN) 5-325 MG tablet Take 1-2 tablets by mouth every 6 (six) hours as needed for severe pain. Qty: 30 tablet, Refills: 0    ondansetron (ZOFRAN) 4 MG tablet Take 1 tablet (4 mg total) by mouth every 6 (six) hours as needed for nausea. Qty: 20 tablet, Refills: 0      CONTINUE these medications which have CHANGED   Details  allopurinol (ZYLOPRIM) 100 MG tablet Take 1 tablet (100 mg total) by mouth daily. Qty: 30 tablet, Refills: 2    NOVOLOG FLEXPEN 100 UNIT/ML FlexPen Inject 10 Units into the skin 3 (three) times daily with meals. Qty: 15 mL, Refills: 6      CONTINUE these medications which have NOT CHANGED   Details  acetaminophen (TYLENOL) 500 MG tablet Take 1,000 mg by mouth every 6 (six) hours as needed for mild pain, moderate pain, fever or headache.     aspirin EC 81 MG tablet Take 81 mg by mouth daily.    cinacalcet (SENSIPAR) 60 MG tablet Take 60 mg by mouth daily.    ferric citrate (AURYXIA) 1 GM 210 MG(Fe) tablet Take 210 mg by mouth 3 (three) times daily with meals.    gabapentin (NEURONTIN) 100 MG capsule Take 200 mg by mouth 2 (two) times daily.    Insulin Detemir (LEVEMIR FLEXTOUCH) 100 UNIT/ML Pen Inject 30 Units into the skin at bedtime.    loratadine (CLARITIN) 10 MG tablet Take 10 mg by mouth daily as needed for allergies.    pravastatin (PRAVACHOL) 40 MG tablet Take 40 mg by mouth daily.       STOP taking these medications     amLODipine (NORVASC) 5 MG tablet  carvedilol (COREG) 6.25 MG tablet      furosemide (LASIX) 40 MG tablet      indomethacin (INDOCIN) 50 MG capsule          Brief H and P: For complete details please refer to admission H and P, but in brief Patient is a 61 year old male with ESRD on HD, MGUS, Gout, Hep C (recently treated), morbid obesity, sleep apnea who presents from dialysis for sudden shortness of breath and hypotension during the dialysis. Due to severe dyspnea, patient was sent to the hospital from dialysis  center. Patient's wife reported that he was also having vomiting and diarrhea for 3 days prior to admission, patient reported his scrotum had been swollen for a few days. Patient noted to have a Proteus bacteremia likely seeded from the urine. Patient empirically on IV antibiotics. ID and urology following, patient underwent I & D on scrotum 03/09/2017.  Hospital Course:  acute respiratory failure with hypoxia - Questionable etiology, morbidly obese patient with hypotension, elevated lactic acid level, sepsis, Proteus bacteremia, scrotal abscess.  -  Patient  reported improvement with shortness of breath after hemodialysis. Influenza PCR was negative.  - Patient with some difficulty in keeping CPAP on through the night. ABG essentially was normal.  - Blood cultures 1/2 positive for Proteus species. Also concerning for epididymitis.  - CT angiogram chest was negative for PE.  -  2-D echo with normal EF 55-60% and grade 2 diastolic dysfunction.Patient has been seen in consultation by pulmonary. Patient has been changed to IV Ancef.  Proteus bacteremia/Sepsis, Scrotal abscess - Blood cultures 1/2 positive for Proteus. Likely may have seeded from the urine, presented with fevers and hypotension at the time of admission. -CT abdomen and pelvis showed subcutaneous edema in the soft tissues of the penis and scrotum however no focal or rim enhancing fluid collection to suggest abscess or any Fournier's gangrene  - ID was consulted and changed IV Rocephin to IV Ancef and recommended two-week course of IV antibiotics with hemodialysis.   scrotal abscess - Urology was consulted, patient underwent underwent I&D on 4/25, then again on 4/27, cultures has been negative so far - blood cultures were positive for Proteus  - CT abdomen and pelvis negative for any abscess or Fournier's gangrene  - Infectious disease closely followed the patient, at the time of discharge, Dr. Johnnye Sima recommended two-week course of  IV Ancef with hemodialysis after discharge  ESRD on HD MWF - Nephrology was consulted, hemodialysis per schedule   diabetes mellitus Hemoglobin A1c 13.  -  Continue Levemir, meal coverage, sliding scale insulin   hyperlipidemia Continue statin.  gout Continue allopurinol.   morbid obesity -BMI 46.7, Patient counseled on diet and weight control  obstructive sleep apnea Patient has been advised that it's in his best interest to use CPAP at bedtime - continue CPAP QHS   hepatitis C Treated.  Day of Discharge BP 127/66 (BP Location: Left Arm)   Pulse 82   Temp 97.8 F (36.6 C) (Oral)   Resp 16   Ht 6' (1.829 m)   Wt (!) 152 kg (335 lb 1.6 oz)   SpO2 100%   BMI 45.45 kg/m   Physical Exam: General: Alert and awake oriented x3 not in any acute distress. HEENT: anicteric sclera, pupils reactive to light and accommodation CVS: S1-S2 clear no murmur rubs or gallops Chest: clear to auscultation bilaterally, no wheezing rales or rhonchi Abdomen: soft nontender, nondistended, normal bowel sounds Extremities: no cyanosis, clubbing or  edema noted bilaterally Neuro: Cranial nerves II-XII intact, no focal neurological deficits GU scrotal edema improving    The results of significant diagnostics from this hospitalization (including imaging, microbiology, ancillary and laboratory) are listed below for reference.    LAB RESULTS: Basic Metabolic Panel:  Recent Labs Lab 03/13/17 0457 03/14/17 0444  NA 131* 128*  K 3.6 3.9  CL 96* 94*  CO2 21* 18*  GLUCOSE 133* 143*  BUN 35* 46*  CREATININE 11.16* 13.61*  CALCIUM 8.6* 8.3*  PHOS 7.3* 8.1*   Liver Function Tests:  Recent Labs Lab 03/13/17 0457 03/14/17 0444  ALBUMIN 2.1* 2.1*   No results for input(s): LIPASE, AMYLASE in the last 168 hours. No results for input(s): AMMONIA in the last 168 hours. CBC:  Recent Labs Lab 03/10/17 0542 03/14/17 0444  WBC 23.1* 17.2*  NEUTROABS 16.2*  --   HGB 9.9* 9.8*   HCT 30.7* 30.9*  MCV 75.8* 76.9*  PLT 249 309   Cardiac Enzymes: No results for input(s): CKTOTAL, CKMB, CKMBINDEX, TROPONINI in the last 168 hours. BNP: Invalid input(s): POCBNP CBG:  Recent Labs Lab 03/13/17 2113 03/14/17 1204  GLUCAP 186* 189*    Significant Diagnostic Studies:  Dg Chest 2 View  Result Date: 03/04/2017 CLINICAL DATA:  Increased shortness of breath and decreased O2 saturations. EXAM: CHEST  2 VIEW COMPARISON:  01/07/2014 FINDINGS: Azygos shadow and central vascular structures are mildly prominent. Findings could be related to vascular congestion. There is no frank pulmonary edema. Heart size is within limits. No pleural effusions. No acute bone abnormality. Negative for a pneumothorax. IMPRESSION: Probable vascular congestion without overt pulmonary edema. Electronically Signed   By: Markus Daft M.D.   On: 03/04/2017 09:25   Ct Angio Chest Pe W/cm &/or Wo Cm  Result Date: 03/04/2017 CLINICAL DATA:  62 year old male with increased shortness of breath and hypoxia following dialysis earlier today. EXAM: CT ANGIOGRAPHY CHEST WITH CONTRAST TECHNIQUE: Multidetector CT imaging of the chest was performed using the standard protocol during bolus administration of intravenous contrast. Multiplanar CT image reconstructions and MIPs were obtained to evaluate the vascular anatomy. CONTRAST:  100 mL Isovue 370 COMPARISON:  Chest x-ray 03/04/2017; prior CT scan of the chest 07/09/2014 FINDINGS: Cardiovascular: Limited opacification of the pulmonary arteries secondary to transient physiologic interruption of contrast. No central or proximal or lobar PE. No evidence of aortic dissection or aneurysm. Aberrant right subclavian artery noticed incidentally. The heart is enlarged. No pericardial effusion. Mediastinum/Nodes: Unremarkable CT appearance of the thyroid gland. No suspicious mediastinal or hilar adenopathy. No soft tissue mediastinal mass. The thoracic esophagus is unremarkable.  Lungs/Pleura: Respiratory motion artifact limits evaluation for small pulmonary nodules. No focal airspace consolidation, pleural effusion, pneumothorax or pulmonary edema. Mild dependent atelectasis present in both lower lobes. Upper Abdomen: Although the liver is incompletely evaluated, the left hepatic lobe and caudate lobe appear relatively hypertrophied which may suggest early cirrhotic change. No acute abnormalities within the upper abdomen. Musculoskeletal: No acute fracture or aggressive appearing lytic or blastic osseous lesion. Review of the MIP images confirms the above findings. IMPRESSION: 1. No evidence of acute pulmonary embolus to the level of the proximal lobar arteries. Evaluation of the more distal pulmonary arterial tree is nondiagnostic secondary to a combination of relatively poor vessel opacification and respiratory motion artifact. 2. No evidence of pneumonia or other acute cardiopulmonary process. 3. Bibasilar subsegmental atelectasis. 4. Aberrant right subclavian artery. 5. Although incompletely imaged, the morphology of the liver suggests the possibility of cirrhotic  change. Does the patient have clinical signs/symptoms of cirrhosis? Electronically Signed   By: Jacqulynn Cadet M.D.   On: 03/04/2017 12:31   US Scrotum  Result Date: 03/04/2017 CLINICAL DATA:  Scrotal pain and swelling. EXAM: SCROTAL ULTRASOUND DOPPLER ULTRASOUND OF THE TESTICLES TECHNIQUE: Complete ultrasound examination of the testicles, epididymis, and other scrotal structures was performed. Color and spectral Doppler ultrasound were also utilized to evaluate blood flow to the testicles. COMPARISON:  No recent prior. FINDINGS: Right testicle Measurements: 3.4 x 2.0 x 2.5 cm. No mass or microlithiasis visualized. Left testicle Measurements: 3.7 x 2.4 x 2.8 cm. 0.6 x 0.3 x 0.4 small area of increased echogenicity noted in the left testicle. Although this may represent a focal area of fat or a at left testicular tumor  cannot be completely excluded. No microlithiasis visualized . Right epididymis:  Tail of the epididymis is prominent. Left epididymis: Prominent epididymis with heterogeneous echotexture. 7 mm epididymal cyst. Hydrocele:  None visualized. Varicocele: Could not perform Valsalva maneuver due to patient's clinical condition. Bilateral scrotal skin thickening. Pulsed Doppler interrogation of both testes demonstrates normal low resistance arterial and venous waveforms bilaterally. IMPRESSION: 1. Cannot exclude bilateral epididymitis. 2. Small 0.6 cm area of increased echogenicity noted in the left testicle. Although this may represent small area of fat a small left testicular tumor cannot be excluded no evidence of torsion. Electronically Signed   By: Marcello Moores  Register   On: 03/04/2017 11:32   [place] Art/ven Flow Abd Pelv Doppler  Result Date: 03/04/2017 CLINICAL DATA:  Scrotal pain and swelling. EXAM: SCROTAL ULTRASOUND DOPPLER ULTRASOUND OF THE TESTICLES TECHNIQUE: Complete ultrasound examination of the testicles, epididymis, and other scrotal structures was performed. Color and spectral Doppler ultrasound were also utilized to evaluate blood flow to the testicles. COMPARISON:  No recent prior. FINDINGS: Right testicle Measurements: 3.4 x 2.0 x 2.5 cm. No mass or microlithiasis visualized. Left testicle Measurements: 3.7 x 2.4 x 2.8 cm. 0.6 x 0.3 x 0.4 small area of increased echogenicity noted in the left testicle. Although this may represent a focal area of fat or a at left testicular tumor cannot be completely excluded. No microlithiasis visualized . Right epididymis:  Tail of the epididymis is prominent. Left epididymis: Prominent epididymis with heterogeneous echotexture. 7 mm epididymal cyst. Hydrocele:  None visualized. Varicocele: Could not perform Valsalva maneuver due to patient's clinical condition. Bilateral scrotal skin thickening. Pulsed Doppler interrogation of both testes demonstrates normal low  resistance arterial and venous waveforms bilaterally. IMPRESSION: 1. Cannot exclude bilateral epididymitis. 2. Small 0.6 cm area of increased echogenicity noted in the left testicle. Although this may represent small area of fat a small left testicular tumor cannot be excluded no evidence of torsion. Electronically Signed   By: Marcello Moores  Register   On: 03/04/2017 11:32    2D ECHO:Study Conclusions  - Left ventricle: The cavity size was mildly dilated. There was   mild concentric hypertrophy. Systolic function was normal. The   estimated ejection fraction was in the range of 55% to 60%.   Features are consistent with a pseudonormal left ventricular   filling pattern, with concomitant abnormal relaxation and   increased filling pressure (grade 2 diastolic dysfunction). - Left atrium: The atrium was mildly dilated.  Impressions:  - Poor technical quality study. Limited echo windows and off axis   imaging, poorly aligned Doppler signals.   Disposition and Follow-up: Discharge Instructions    Diet - low sodium heart healthy    Complete by:  As  directed    Discharge instructions    Complete by:  As directed    posterior scrotal wound, change the dressing every 8 hours, using sterile gauze wet-to-dry with normal saline.  Place mesh underpants to keep the dressing in place.   Increase activity slowly    Complete by:  As directed        DISPOSITION: home    DISCHARGE FOLLOW-UP Follow-up Information    Gara Kroner, MD. Schedule an appointment as soon as possible for a visit in 2 week(s).   Specialty:  Family Medicine Contact information: [place] [address] [phone]        Jorja Loa, MD. Schedule an appointment as soon as possible for a visit in 1 week(s).   Specialty:  Urology Why:  hospital follow-up Contact information: [place] [address] [phone]            Time spent on Discharge: 21mins    Signed:   Estill Cotta M.D. [facility] 03/14/2017, 4:58 PM Pager: [phone]

## 2017-03-14 NOTE — Evaluation (Signed)
Physical Therapy Evaluation Patient Details Name: [name] MRN: [id number] DOB: 20-Feb-1956 Today's Date: 03/14/2017   History of Present Illness  Patient is a 61 year old male with ESRD on HD, MGUS, Gout, Hep C (recently treated), morbid obesity, sleep apnea who presents from dialysis for sudden shortness of breath and hypotension during the dialysis. Due to severe dyspnea, patient was sent to the hospital from dialysis center. Patient's wife reported that he was also having vomiting and diarrhea for 3 days prior to admission, patient reported his scrotum had been swollen for a few days. Proteus bacteremia  Clinical Impression  Patient evaluated by Physical Therapy with no further acute PT needs identified, as pt is to dc today. All education has been completed and the patient has no further questions. Overall moving well, independently managing in the room; Noted some DOE with amb on room air; O2 sats 100%;  See below for any follow-up Physical Therapy or equipment needs. PT is signing off. Thank you for this referral.     Follow Up Recommendations Other (comment) [facility] for dressign changes and education)    Equipment Recommendations  None recommended by PT    Recommendations for Other Services       Precautions / Restrictions Restrictions Weight Bearing Restrictions: No      Mobility  Bed Mobility Overal bed mobility: Modified Independent             General bed mobility comments: used rails  Transfers   Equipment used: None Transfers: Sit to/from Stand Sit to Stand: Modified independent (Device/Increase time)         General transfer comment: increased time  Ambulation/Gait Ambulation/Gait assistance: Supervision Ambulation Distance (Feet): 120 Feet Assistive device: None Gait Pattern/deviations: Wide base of support   Gait velocity interpretation: Below normal speed for age/gender General Gait Details: Cues to self-monitor for activity tolerance; noted  some DOE; O2 sats 100%  Stairs Stairs: Yes Stairs assistance: Supervision Stair Management: One rail Left;Step to pattern;Forwards Number of Stairs: 3 General stair comments: slow moving, but no gross difficulty  Wheelchair Mobility    Modified Rankin (Stroke Patients Only)       Balance                                             Pertinent Vitals/Pain Pain Assessment: Faces Faces Pain Scale: Hurts a little bit Pain Location: discomfort at scrotum Pain Descriptors / Indicators: Discomfort;Grimacing Pain Intervention(s): Monitored during session    Home Living Family/patient expects to be discharged to:: Private residence Living Arrangements: Spouse/significant other Available Help at Discharge: Family;Available PRN/intermittently Type of Home: House Home Access: Stairs to enter Entrance Stairs-Rails: Left Entrance Stairs-Number of Steps: 5 Home Layout: One level Home Equipment: None      Prior Function Level of Independence: Independent         Comments: occasionally works Land at [employer]        Extremity/Trunk Assessment   Upper Extremity Assessment Upper Extremity Assessment: Overall WFL for tasks assessed    Lower Extremity Assessment Lower Extremity Assessment: Overall WFL for tasks assessed       Communication   Communication: No difficulties  Cognition Arousal/Alertness: Awake/alert Behavior During Therapy: WFL for tasks assessed/performed Overall Cognitive Status: Within Functional Limits for tasks assessed  General Comments      Exercises     Assessment/Plan    PT Assessment Patent does not need any further PT services  PT Problem List         PT Treatment Interventions      PT Goals (Current goals can be found in the Care Plan section)  Acute Rehab PT Goals Patient Stated Goal: hopes to be home soon PT Goal Formulation:  All assessment and education complete, DC therapy    Frequency     Barriers to discharge        Co-evaluation               AM-PAC PT "6 Clicks" Daily Activity  Outcome Measure Difficulty turning over in bed (including adjusting bedclothes, sheets and blankets)?: A Little Difficulty moving from lying on back to sitting on the side of the bed? : A Lot Difficulty sitting down on and standing up from a chair with arms (e.g., wheelchair, bedside commode, etc,.)?: A Little Help needed moving to and from a bed to chair (including a wheelchair)?: None Help needed walking in hospital room?: None Help needed climbing 3-5 steps with a railing? : A Little 6 Click Score: 19    End of Session   Activity Tolerance: Patient tolerated treatment well Patient left: in chair;with call bell/phone within reach Nurse Communication: Mobility status PT Visit Diagnosis: Other abnormalities of gait and mobility (R26.89)    Time: 4142-3953 (minus 5-7 minutes while RN changed dressing) PT Time Calculation (min) (ACUTE ONLY): 32 min   Charges:   PT Evaluation $PT Eval Low Complexity: 1 Procedure PT Treatments $Gait Training: 8-22 mins   PT G Codes:        Roney Marion, PT  Acute Rehabilitation Services Pager [phone] Office [phone]   [place] 03/14/2017, 5:33 PM

## 2017-03-14 NOTE — Progress Notes (Signed)
Patient slept well last PM.  No change in assessment noted.  Scrotal abdominal pad changed this morning before scheduled dialysis. All questions and concerns addressed.  Patient left via bed for dialysis at 0650.

## 2017-03-15 DIAGNOSIS — R7881 Bacteremia: Secondary | ICD-10-CM | POA: Insufficient documentation

## 2017-03-16 DIAGNOSIS — N2581 Secondary hyperparathyroidism of renal origin: Secondary | ICD-10-CM | POA: Diagnosis not present

## 2017-03-16 DIAGNOSIS — D509 Iron deficiency anemia, unspecified: Secondary | ICD-10-CM | POA: Diagnosis not present

## 2017-03-16 DIAGNOSIS — N186 End stage renal disease: Secondary | ICD-10-CM | POA: Diagnosis not present

## 2017-03-16 DIAGNOSIS — E119 Type 2 diabetes mellitus without complications: Secondary | ICD-10-CM | POA: Diagnosis not present

## 2017-03-16 DIAGNOSIS — R7881 Bacteremia: Secondary | ICD-10-CM | POA: Diagnosis not present

## 2017-03-16 DIAGNOSIS — D631 Anemia in chronic kidney disease: Secondary | ICD-10-CM | POA: Diagnosis not present

## 2017-03-16 LAB — AEROBIC/ANAEROBIC CULTURE W GRAM STAIN (SURGICAL/DEEP WOUND)

## 2017-03-16 LAB — AEROBIC/ANAEROBIC CULTURE (SURGICAL/DEEP WOUND)

## 2017-03-18 ENCOUNTER — Encounter (HOSPITAL_COMMUNITY): Payer: Self-pay | Admitting: *Deleted

## 2017-03-18 ENCOUNTER — Other Ambulatory Visit: Payer: Self-pay | Admitting: Urology

## 2017-03-18 DIAGNOSIS — N2581 Secondary hyperparathyroidism of renal origin: Secondary | ICD-10-CM | POA: Diagnosis not present

## 2017-03-18 DIAGNOSIS — D509 Iron deficiency anemia, unspecified: Secondary | ICD-10-CM | POA: Diagnosis not present

## 2017-03-18 DIAGNOSIS — R7881 Bacteremia: Secondary | ICD-10-CM | POA: Diagnosis not present

## 2017-03-18 DIAGNOSIS — D631 Anemia in chronic kidney disease: Secondary | ICD-10-CM | POA: Diagnosis not present

## 2017-03-18 DIAGNOSIS — N186 End stage renal disease: Secondary | ICD-10-CM | POA: Diagnosis not present

## 2017-03-18 DIAGNOSIS — E119 Type 2 diabetes mellitus without complications: Secondary | ICD-10-CM | POA: Diagnosis not present

## 2017-03-18 NOTE — Progress Notes (Signed)
Pt denies SOB and chest pain but stated that he cannot remember the name of his cardiologist. Pt denies having a stress test and cardiac cath. Pt made aware to stop taking vitamins, fish oil and herbal medications. Do not take any NSAIDs ie: Ibuprofen, Advil, Naproxen BC and Goody Powder. Pt made aware to take 24 units of Levemir the night before surgery. Pt made aware to take no insulin the morning of surgery, check his BG every 2 hours prior to arrival to hospital, treat a BG < 70 with 4 ounces of Apple or Cranberry Juice, wait 15 minutes after drinking the juice to recheck BG, if it remains < 70 call and speak with a nurse on the SS unit. Pt verbalized understanding of all pre-op instructions.

## 2017-03-18 NOTE — Progress Notes (Signed)
Pt made aware that he can eat something light " non-fat " the morning of procedure ( Per Dr. Marcie Bal, Anesthesia). Pt made aware to be completed breakfast by 6:30 A.M.. Pt verbalized understanding.

## 2017-03-21 ENCOUNTER — Ambulatory Visit (HOSPITAL_COMMUNITY): Payer: Medicare Other | Admitting: Certified Registered Nurse Anesthetist

## 2017-03-21 ENCOUNTER — Encounter (HOSPITAL_COMMUNITY)
Admission: RE | Disposition: A | Discharge status: Home / Self Care | Payer: Self-pay | Source: Ambulatory Visit | Attending: Urology

## 2017-03-21 ENCOUNTER — Encounter (HOSPITAL_COMMUNITY): Payer: Self-pay | Admitting: *Deleted

## 2017-03-21 ENCOUNTER — Ambulatory Visit (HOSPITAL_COMMUNITY)
Admission: RE | Admit: 2017-03-21 | Discharge: 2017-03-21 | Disposition: A | Discharge status: Home / Self Care | Payer: Medicare Other | Source: Ambulatory Visit | Attending: Urology | Admitting: Urology

## 2017-03-21 DIAGNOSIS — Z888 Allergy status to other drugs, medicaments and biological substances status: Secondary | ICD-10-CM | POA: Insufficient documentation

## 2017-03-21 DIAGNOSIS — D509 Iron deficiency anemia, unspecified: Secondary | ICD-10-CM | POA: Diagnosis not present

## 2017-03-21 DIAGNOSIS — N186 End stage renal disease: Secondary | ICD-10-CM | POA: Diagnosis not present

## 2017-03-21 DIAGNOSIS — R7881 Bacteremia: Secondary | ICD-10-CM | POA: Diagnosis not present

## 2017-03-21 DIAGNOSIS — Z6841 Body Mass Index (BMI) 40.0 and over, adult: Secondary | ICD-10-CM | POA: Diagnosis not present

## 2017-03-21 DIAGNOSIS — B1920 Unspecified viral hepatitis C without hepatic coma: Secondary | ICD-10-CM | POA: Diagnosis not present

## 2017-03-21 DIAGNOSIS — F419 Anxiety disorder, unspecified: Secondary | ICD-10-CM | POA: Insufficient documentation

## 2017-03-21 DIAGNOSIS — E119 Type 2 diabetes mellitus without complications: Secondary | ICD-10-CM | POA: Diagnosis not present

## 2017-03-21 DIAGNOSIS — D631 Anemia in chronic kidney disease: Secondary | ICD-10-CM | POA: Diagnosis not present

## 2017-03-21 DIAGNOSIS — I120 Hypertensive chronic kidney disease with stage 5 chronic kidney disease or end stage renal disease: Secondary | ICD-10-CM | POA: Diagnosis not present

## 2017-03-21 DIAGNOSIS — G4730 Sleep apnea, unspecified: Secondary | ICD-10-CM | POA: Diagnosis not present

## 2017-03-21 DIAGNOSIS — M109 Gout, unspecified: Secondary | ICD-10-CM | POA: Insufficient documentation

## 2017-03-21 DIAGNOSIS — E1122 Type 2 diabetes mellitus with diabetic chronic kidney disease: Secondary | ICD-10-CM | POA: Insufficient documentation

## 2017-03-21 DIAGNOSIS — I132 Hypertensive heart and chronic kidney disease with heart failure and with stage 5 chronic kidney disease, or end stage renal disease: Secondary | ICD-10-CM | POA: Diagnosis not present

## 2017-03-21 DIAGNOSIS — Z8619 Personal history of other infectious and parasitic diseases: Secondary | ICD-10-CM | POA: Diagnosis not present

## 2017-03-21 DIAGNOSIS — K219 Gastro-esophageal reflux disease without esophagitis: Secondary | ICD-10-CM | POA: Insufficient documentation

## 2017-03-21 DIAGNOSIS — Z87891 Personal history of nicotine dependence: Secondary | ICD-10-CM | POA: Diagnosis not present

## 2017-03-21 DIAGNOSIS — Z992 Dependence on renal dialysis: Secondary | ICD-10-CM | POA: Insufficient documentation

## 2017-03-21 DIAGNOSIS — Z880 Allergy status to penicillin: Secondary | ICD-10-CM | POA: Insufficient documentation

## 2017-03-21 DIAGNOSIS — I509 Heart failure, unspecified: Secondary | ICD-10-CM | POA: Diagnosis not present

## 2017-03-21 DIAGNOSIS — N492 Inflammatory disorders of scrotum: Secondary | ICD-10-CM | POA: Diagnosis not present

## 2017-03-21 DIAGNOSIS — L02215 Cutaneous abscess of perineum: Secondary | ICD-10-CM | POA: Diagnosis not present

## 2017-03-21 DIAGNOSIS — E669 Obesity, unspecified: Secondary | ICD-10-CM | POA: Insufficient documentation

## 2017-03-21 DIAGNOSIS — N2581 Secondary hyperparathyroidism of renal origin: Secondary | ICD-10-CM | POA: Diagnosis not present

## 2017-03-21 DIAGNOSIS — N5089 Other specified disorders of the male genital organs: Secondary | ICD-10-CM | POA: Diagnosis not present

## 2017-03-21 DIAGNOSIS — Z9849 Cataract extraction status, unspecified eye: Secondary | ICD-10-CM | POA: Insufficient documentation

## 2017-03-21 DIAGNOSIS — E785 Hyperlipidemia, unspecified: Secondary | ICD-10-CM | POA: Insufficient documentation

## 2017-03-21 HISTORY — DX: Obesity, unspecified: E66.9

## 2017-03-21 HISTORY — PX: SCROTAL EXPLORATION: SHX2386

## 2017-03-21 HISTORY — DX: Headache: R51

## 2017-03-21 HISTORY — DX: Pneumonia, unspecified organism: J18.9

## 2017-03-21 HISTORY — DX: Headache, unspecified: R51.9

## 2017-03-21 HISTORY — DX: Gastro-esophageal reflux disease without esophagitis: K21.9

## 2017-03-21 LAB — POCT I-STAT 4, (NA,K, GLUC, HGB,HCT)
GLUCOSE: 144 mg/dL — AB (ref 65–99)
HEMATOCRIT: 34 % — AB (ref 39.0–52.0)
Hemoglobin: 11.6 g/dL — ABNORMAL LOW (ref 13.0–17.0)
Potassium: 3.6 mmol/L (ref 3.5–5.1)
SODIUM: 135 mmol/L (ref 135–145)

## 2017-03-21 LAB — GLUCOSE, CAPILLARY: Glucose-Capillary: 109 mg/dL — ABNORMAL HIGH (ref 65–99)

## 2017-03-21 SURGERY — EXPLORATION, SCROTUM
Anesthesia: General | Site: Scrotum

## 2017-03-21 MED ORDER — FENTANYL CITRATE (PF) 100 MCG/2ML IJ SOLN
INTRAMUSCULAR | Status: AC
  Filled 2017-03-21: qty 2

## 2017-03-21 MED ORDER — ONDANSETRON HCL 4 MG/2ML IJ SOLN
INTRAMUSCULAR | Status: AC
  Filled 2017-03-21: qty 2

## 2017-03-21 MED ORDER — HYDROCODONE-ACETAMINOPHEN 5-325 MG PO TABS: 1.0000 | ORAL_TABLET | Freq: Four times a day (QID) | ORAL | 0 refills | Status: DC | PRN

## 2017-03-21 MED ORDER — DEXAMETHASONE SODIUM PHOSPHATE 10 MG/ML IJ SOLN
INTRAMUSCULAR | Status: AC
  Filled 2017-03-21: qty 1

## 2017-03-21 MED ORDER — ONDANSETRON HCL 4 MG/2ML IJ SOLN
INTRAMUSCULAR | Status: DC | PRN
  Administered 2017-03-21: 4 mg via INTRAVENOUS

## 2017-03-21 MED ORDER — FENTANYL CITRATE (PF) 250 MCG/5ML IJ SOLN
INTRAMUSCULAR | Status: AC
  Filled 2017-03-21: qty 5

## 2017-03-21 MED ORDER — 0.9 % SODIUM CHLORIDE (POUR BTL) OPTIME
TOPICAL | Status: DC | PRN
  Administered 2017-03-21: 1000 mL

## 2017-03-21 MED ORDER — PROPOFOL 10 MG/ML IV BOLUS
INTRAVENOUS | Status: DC | PRN
  Administered 2017-03-21: 130 mg via INTRAVENOUS

## 2017-03-21 MED ORDER — FENTANYL CITRATE (PF) 100 MCG/2ML IJ SOLN
25.0000 ug | INTRAMUSCULAR | Status: DC | PRN
  Administered 2017-03-21 (×3): 50 ug via INTRAVENOUS

## 2017-03-21 MED ORDER — MIDAZOLAM HCL 2 MG/2ML IJ SOLN
INTRAMUSCULAR | Status: DC | PRN
  Administered 2017-03-21: 2 mg via INTRAVENOUS

## 2017-03-21 MED ORDER — PHENYLEPHRINE 40 MCG/ML (10ML) SYRINGE FOR IV PUSH (FOR BLOOD PRESSURE SUPPORT)
PREFILLED_SYRINGE | INTRAVENOUS | Status: DC | PRN
  Administered 2017-03-21: 120 ug via INTRAVENOUS
  Administered 2017-03-21: 160 ug via INTRAVENOUS
  Administered 2017-03-21: 120 ug via INTRAVENOUS

## 2017-03-21 MED ORDER — SUCCINYLCHOLINE CHLORIDE 200 MG/10ML IV SOSY
PREFILLED_SYRINGE | INTRAVENOUS | Status: AC
  Filled 2017-03-21: qty 10

## 2017-03-21 MED ORDER — FENTANYL CITRATE (PF) 100 MCG/2ML IJ SOLN
INTRAMUSCULAR | Status: DC | PRN
  Administered 2017-03-21: 100 ug via INTRAVENOUS
  Administered 2017-03-21 (×2): 50 ug via INTRAVENOUS

## 2017-03-21 MED ORDER — MIDAZOLAM HCL 2 MG/2ML IJ SOLN
INTRAMUSCULAR | Status: AC
  Filled 2017-03-21: qty 2

## 2017-03-21 MED ORDER — SODIUM CHLORIDE 0.9 % IV SOLN
INTRAVENOUS | Status: DC
  Administered 2017-03-21: 15:00:00 via INTRAVENOUS
  Administered 2017-03-21: 10 mL/h via INTRAVENOUS
  Administered 2017-03-21: 17:00:00 via INTRAVENOUS

## 2017-03-21 MED ORDER — PHENYLEPHRINE 40 MCG/ML (10ML) SYRINGE FOR IV PUSH (FOR BLOOD PRESSURE SUPPORT)
PREFILLED_SYRINGE | INTRAVENOUS | Status: AC
  Filled 2017-03-21: qty 10

## 2017-03-21 MED ORDER — LIDOCAINE 2% (20 MG/ML) 5 ML SYRINGE
INTRAMUSCULAR | Status: DC | PRN
  Administered 2017-03-21: 60 mg via INTRAVENOUS

## 2017-03-21 MED ORDER — BUPIVACAINE HCL (PF) 0.25 % IJ SOLN
INTRAMUSCULAR | Status: AC
  Filled 2017-03-21: qty 30

## 2017-03-21 MED ORDER — SUCCINYLCHOLINE CHLORIDE 200 MG/10ML IV SOSY
PREFILLED_SYRINGE | INTRAVENOUS | Status: DC | PRN
  Administered 2017-03-21: 120 mg via INTRAVENOUS

## 2017-03-21 SURGICAL SUPPLY — 31 items
BAG URO CATCHER STRL LF (MISCELLANEOUS) IMPLANT
BLADE 10 SAFETY STRL DISP (BLADE) ×2 IMPLANT
BLADE SURG 15 STRL LF DISP TIS (BLADE) ×2 IMPLANT
BLADE SURG 15 STRL SS (BLADE) ×2
BNDG GAUZE ELAST 4 BULKY (GAUZE/BANDAGES/DRESSINGS) ×2 IMPLANT
CANISTER SUCT 3000ML PPV (MISCELLANEOUS) ×2 IMPLANT
CONT SPEC 4OZ CLIKSEAL STRL BL (MISCELLANEOUS) ×2 IMPLANT
DRAIN PENROSE 1/4X12 LTX STRL (WOUND CARE) ×2 IMPLANT
DRAPE LAPAROTOMY T 102X78X121 (DRAPES) ×2 IMPLANT
DRSG PAD ABDOMINAL 8X10 ST (GAUZE/BANDAGES/DRESSINGS) ×4 IMPLANT
ELECT REM PT RETURN 9FT ADLT (ELECTROSURGICAL) ×2
ELECTRODE REM PT RTRN 9FT ADLT (ELECTROSURGICAL) ×1 IMPLANT
GAUZE PACKING IODOFORM 1X5 (MISCELLANEOUS) ×2 IMPLANT
GAUZE SPONGE 4X4 12PLY STRL (GAUZE/BANDAGES/DRESSINGS) ×2 IMPLANT
GLOVE SURG ORTHO 8.0 STRL STRW (GLOVE) ×2 IMPLANT
KIT BASIN OR (CUSTOM PROCEDURE TRAY) ×2 IMPLANT
KIT ROOM TURNOVER OR (KITS) ×2 IMPLANT
NS IRRIG 1000ML POUR BTL (IV SOLUTION) ×2 IMPLANT
PACK GENERAL/GYN (CUSTOM PROCEDURE TRAY) ×2 IMPLANT
PAD ABD 8X10 STRL (GAUZE/BANDAGES/DRESSINGS) ×2 IMPLANT
PAD ARMBOARD 7.5X6 YLW CONV (MISCELLANEOUS) ×4 IMPLANT
SOL PREP POV-IOD 4OZ 10% (MISCELLANEOUS) ×2 IMPLANT
SPONGE LAP 18X18 X RAY DECT (DISPOSABLE) IMPLANT
SUT CHROMIC 3 0 SH 27 (SUTURE) ×2 IMPLANT
SUT ETHILON 3 0 PS 1 (SUTURE) ×2 IMPLANT
SUT PROLENE 4 0 PS 2 18 (SUTURE) ×2 IMPLANT
SUT VICRYL 4-0 PS2 18IN ABS (SUTURE) ×2 IMPLANT
SWAB COLLECTION DEVICE MRSA (MISCELLANEOUS) ×2 IMPLANT
TOWEL OR 17X24 6PK STRL BLUE (TOWEL DISPOSABLE) ×2 IMPLANT
TOWEL OR 17X26 10 PK STRL BLUE (TOWEL DISPOSABLE) ×2 IMPLANT
WATER STERILE IRR 1000ML POUR (IV SOLUTION) ×2 IMPLANT

## 2017-03-21 NOTE — Anesthesia Postprocedure Evaluation (Signed)
Anesthesia Post Note  Patient: [name]  Procedure(s) Performed: Procedure(s) (LRB): REPEAT SCROTAL DEBRIDEMENT (N/A)  Patient location during evaluation: PACU Anesthesia Type: General Level of consciousness: awake and alert Pain management: pain level controlled Vital Signs Assessment: post-procedure vital signs reviewed and stable Respiratory status: spontaneous breathing, nonlabored ventilation, respiratory function stable and patient connected to nasal cannula oxygen Cardiovascular status: blood pressure returned to baseline and stable Postop Assessment: no signs of nausea or vomiting Anesthetic complications: no       Last Vitals:  Vitals:   03/21/17 1715 03/21/17 1722  BP:  116/65  Pulse: 75 72  Resp: 19 16  Temp:  36.7 C    Last Pain:  Vitals:   03/21/17 1722  TempSrc:   PainSc: 4                  Cosandra Plouffe,W. EDMOND

## 2017-03-21 NOTE — Anesthesia Preprocedure Evaluation (Addendum)
Anesthesia Evaluation  Patient identified by MRN, date of birth, ID band Patient awake    Reviewed: Allergy & Precautions, NPO status , Patient's Chart, lab work & pertinent test results  History of Anesthesia Complications (+) Emergence Delirium  Airway Mallampati: II  TM Distance: >3 FB     Dental  (+) Dental Advisory Given, Teeth Intact, Missing,    Pulmonary sleep apnea and Continuous Positive Airway Pressure Ventilation , pneumonia, former smoker,    breath sounds clear to auscultation       Cardiovascular Exercise Tolerance: Poor hypertension, Pt. on medications +CHF   Rhythm:Regular Rate:Normal     Neuro/Psych  Headaches, Anxiety    GI/Hepatic GERD  ,(+) Hepatitis -, C  Endo/Other  diabetes  Renal/GU Dialysis and ESRFRenal diseaseDialysis M-W-F     Musculoskeletal   Abdominal (+) + obese,   Peds  Hematology   Anesthesia Other Findings   Reproductive/Obstetrics                          Anesthesia Physical Anesthesia Plan  ASA: III  Anesthesia Plan: General   Post-op Pain Management:    Induction: Intravenous  Airway Management Planned: Oral ETT  Additional Equipment:   Intra-op Plan:   Post-operative Plan: Extubation in OR  Informed Consent: I have reviewed the patients History and Physical, chart, labs and discussed the procedure including the risks, benefits and alternatives for the proposed anesthesia with the patient or authorized representative who has indicated his/her understanding and acceptance.   Dental advisory given  Plan Discussed with: CRNA and Anesthesiologist  Anesthesia Plan Comments:        Anesthesia Quick Evaluation

## 2017-03-21 NOTE — Transfer of Care (Signed)
Immediate Anesthesia Transfer of Care Note  Patient: [name]  Procedure(s) Performed: Procedure(s): REPEAT SCROTAL DEBRIDEMENT (N/A)  Patient Location: PACU  Anesthesia Type:General  Level of Consciousness: awake, alert , oriented and patient cooperative  Airway & Oxygen Therapy: Patient Spontanous Breathing and Patient connected to face mask oxygen  Post-op Assessment: Report given to RN and Post -op Vital signs reviewed and stable  Post vital signs: Reviewed and stable  Last Vitals:  Vitals:   03/21/17 1652 03/21/17 1700  BP: 112/63   Pulse: 85 86  Resp: 17 (!) 22  Temp:      Last Pain:  Vitals:   03/21/17 1713  TempSrc:   PainSc: 5       Patients Stated Pain Goal: 2 (29/09/03 0149)  Complications: No apparent anesthesia complications

## 2017-03-21 NOTE — Interval H&P Note (Signed)
History and Physical Interval Note:  03/21/2017 3:38 PM  [name]  has presented today for surgery, with the diagnosis of scrotal infection  The various methods of treatment have been discussed with the patient and family. After consideration of risks, benefits and other options for treatment, the patient has consented to  Procedure(s): REPEAT SCROTAL DEBRIDEMENT (N/A) as a surgical intervention .  The patient's history has been reviewed, patient examined, no change in status, stable for surgery.  I have reviewed the patient's chart and labs.  Questions were answered to the patient's satisfaction.     Jorja Loa

## 2017-03-21 NOTE — Op Note (Signed)
Preoperative diagnosis: Skin necrosis of the scrotum with recent history of scrotal infection.  Postoperative diagnosis: Same  Principal procedure: Debridement of scrotum, irrigation of scrotal wounds, placement of drain.  Surgeon: Diona Fanti  Anesthesia: Gen.  Complications: None.  Specimens: None.  Estimated blood loss: 25 mL  Drain: Penrose drain bridging left and right scrotal wounds.  Indications: 61 year old male with persistent necrotic area of his anterior scrotum, following drainage of a left scrotal abscess approximately 10 days ago.  The patient was seen in the office last week and had been developing necrotic area in his anterior midline scrotum.  He presents at this time for scrotal debridement and wound inspection.  Description of procedure: The patient was properly identified in the holding area.  He received preoperative IV antibiotics and dialysis earlier in the day.  He was taken to the operating room where general anesthesia was administered.  He was placed in the dorsolithotomy position.  Genitalia and perineum were prepped and draped.  Proper timeout was performed.  Inspection of the scrotum revealed no evidence of crepitus.  There was no active drainage from his scrotal wounds.  There was a necrotic area is anterior midline scrotum, the largest area was approximately 2 square centimeters.  A smaller inferior was about half the size of that.  There was mild induration surrounding these wounds.  The previously drained abscess cavity between the inferior midline scrotum just at the perineum and the left superior lateral, or scrotum was inspected.  There seem to be good granulation tissue, especially in the posterior scrotal wound.  No necrotic tissue was seen.  There was no new abscess cavity that could be identified.  There is no fluctuance of any part of the scrotum.  I then used the Bovie to cut away the necrotic tissue underlying the midline necrotic areas.  This actually  invaded about 1-2 centimeters within the scrotum, where necrotic tissue was easily cut away down to healthy bleeding tissue.  Probing circumferentially within this wound, no abscess could be seen, and there was surrounding tissue that appeared healthy.  The cavity extended from the midline scrotum to the left scrotal wound.  I then irrigated copiously with antibiotic irrigation, all 3 wound sites.  I then placed a quarter-inch Penrose drain bridging the anterior midline scrotal wound to the left lateral scrotal wound.  It was sutured in 2 places with 3-0 nylon sutures.  After adequate hemostasis was achieved, I then packed the wounds with 1 inch iodoform gauze.  An ABG dressing and mesh underpants were then placed.  Patient tolerated procedure well.  His awakened and taken to PACU in stable condition.

## 2017-03-21 NOTE — Transfer of Care (Signed)
Immediate Anesthesia Transfer of Care Note  Patient: [name]  Procedure(s) Performed: Procedure(s): REPEAT SCROTAL DEBRIDEMENT (N/A)  Patient Location: PACU  Anesthesia Type:General  Level of Consciousness: awake, alert , oriented and patient cooperative  Airway & Oxygen Therapy: Patient Spontanous Breathing and Patient connected to face mask oxygen  Post-op Assessment: Report given to RN and Post -op Vital signs reviewed and stable  Post vital signs: Reviewed and stable  Last Vitals:  Vitals:   03/21/17 1652 03/21/17 1700  BP: 112/63   Pulse: 85 86  Resp: 17 (!) 22  Temp:      Last Pain:  Vitals:   03/21/17 1713  TempSrc:   PainSc: 5       Patients Stated Pain Goal: 2 (70/23/01 7209)  Complications: No apparent anesthesia complications

## 2017-03-21 NOTE — Discharge Instructions (Signed)
1.  We will arrange home health care.  2.  Change dressing at least once a day.  It is fine to shower and wash those areas off in the shower.  3.  We will call to set up an appropriate follow-up.  4.  The small rubber drain that is coming out of the wound edges will eventually be removed

## 2017-03-21 NOTE — Anesthesia Procedure Notes (Addendum)
Procedure Name: Intubation Date/Time: 03/21/2017 3:47 PM Performed by: Wanita Chamberlain Pre-anesthesia Checklist: Patient identified, Timeout performed, Emergency Drugs available, Suction available and Patient being monitored Patient Re-evaluated:Patient Re-evaluated prior to inductionOxygen Delivery Method: Circle system utilized Preoxygenation: Pre-oxygenation with 100% oxygen Intubation Type: IV induction and Cricoid Pressure applied Ventilation: Mask ventilation with difficulty and Oral airway inserted - appropriate to patient size Laryngoscope Size: Mac and 4 Grade View: Grade I Tube type: Oral Laser Tube: Cuffed inflated with minimal occlusive pressure - saline Tube size: 7.5 mm Number of attempts: 1 Airway Equipment and Method: Stylet and Oral airway

## 2017-03-21 NOTE — H&P (Signed)
H&P  Chief Complaint: scrotal swelling/infection  History of Present Illness: 61 year old male, diabetic with congestive heart disease, end-stage renal disease on hemodialysis with multiple other medical problems, presents at this time for repeat scrotal debridement.  On 03/11/2017 he underwent anesthetic procedure for scrotal exploration, debridement, drainage of abscesses in the left hemiscrotum.  He followed up late last week for repeat check.  He did have new development of a necrotic area on the right inferior scrotum.  He presents at this time for debridement and wound exploration.  Past Medical History:  Diagnosis Date  . Alcohol abuse   . Congestive heart disease ([place]) 12/2011  . Diabetes mellitus   . GERD (gastroesophageal reflux disease)   . Gout   . Headache   . Hepatitis    being treated for Hepatitis C  . Hyperlipidemia   . Hyperlipidemia   . Hypertension   . Monoclonal gammopathies 02/25/2012  . Obesity   . Pedal edema   . Pneumonia   . Renal insufficiency   . Sleep apnea    uses cpap  . Tobacco abuse     Past Surgical History:  Procedure Laterality Date  . AV FISTULA PLACEMENT Right 01/10/2014   Procedure: ARTERIOVENOUS (AV) FISTULA CREATION- RIGHT RADIOCEPHALIC ;  Surgeon: Angelia Mould, MD;  Location: [place];  Service: Vascular;  Laterality: Right;  . COLONOSCOPY    . EYE SURGERY     cataract surgery  . Ganglion cyst removal x 2    . INSERTION OF DIALYSIS CATHETER N/A 01/07/2014   Procedure: INSERTION OF DIALYSIS CATHETER;  Surgeon: Conrad Willmar, MD;  Location: [place];  Service: Vascular;  Laterality: N/A;  . Left knee arthroscopy with generalized joint synovectomy,  01/2006  . LIGATION OF COMPETING BRANCHES OF ARTERIOVENOUS FISTULA Right 05/28/2014   Procedure: LIGATION OF COMPETING BRANCHES OF ARTERIOVENOUS FISTULA;  Surgeon: Angelia Mould, MD;  Location: [place];  Service: Vascular;  Laterality: Right;  . REVISON OF ARTERIOVENOUS FISTULA Right  11/20/1094   Procedure: PLICATION REPAIR OF ARTERIOVENOUS FISTULA PSEUDOANEURYSM;  Surgeon: Serafina Mitchell, MD;  Location: [place];  Service: Vascular;  Laterality: Right;  . SCROTAL EXPLORATION N/A 03/11/2017   Procedure: SCROTUM EXPLORATION AND DEBRIDEMENT;  Surgeon: Franchot Gallo, MD;  Location: [place];  Service: Urology;  Laterality: N/A;  . SHUNTOGRAM Right 05/14/2014   Procedure: FISTULOGRAM;  Surgeon: Serafina Mitchell, MD;  Location: [facility] CATH LAB;  Service: Cardiovascular;  Laterality: Right;    Home Medications:    Allergies:  Allergies  Allergen Reactions  . Penicillins Nausea And Vomiting and Other (See Comments)    Has patient had a PCN reaction causing immediate rash, facial/tongue/throat swelling, SOB or lightheadedness with hypotension: No Has patient had a PCN reaction causing severe rash involving mucus membranes or skin necrosis: No Has patient had a PCN reaction that required hospitalization No Has patient had a PCN reaction occurring within the last 10 years: No If all of the above answers are "NO", then may proceed with Cephalosporin use.  . Vioxx [Rofecoxib] Nausea And Vomiting    Family History  Problem Relation Age of Onset  . Diabetes Mother   . Heart disease Mother   . Lung cancer Father     Social History:  reports that he quit smoking about 6 years ago. His smoking use included Cigarettes. He has a 9.90 pack-year smoking history. He has never used smokeless tobacco. He reports that he does not drink alcohol or use drugs.  ROS: A complete review of systems was performed.  All systems are negative except for pertinent findings as noted.  Physical Exam:  Vital signs in last 24 hours: Temp:  [98.9 F (37.2 C)] 98.9 F (37.2 C) (05/07 1313) Pulse Rate:  [90] 90 (05/07 1313) Resp:  [18] 18 (05/07 1313) BP: (124)/(70) 124/70 (05/07 1313) SpO2:  [97 %] 97 % (05/07 1313) Weight:  [151 kg (332 lb 14.3 oz)] 151 kg (332 lb 14.3 oz) (05/07 1313) Constitutional:   Alert and oriented, No acute distress Cardiovascular: Regular rate and rhythm, No JVD Respiratory: Normal respiratory effort, Lungs clear bilaterally GI: Abdomen is soft, nontender, nondistended, no abdominal masses Genitourinary: No CVAT. Phallus is uncircumcised,foreskin is somewhat edematous.  Scrotum is edematous.  2 wounds in the lower left scrotum appeared clean at last exam.  There is a necrotic area in the anterior right/mid scrotum.  No crepitus, no significant induration. Rectal: Normal sphincter tone, no rectal masses, prostate is non tender and without nodularity.   Laboratory Data:   Recent Labs  03/21/17 1328  HGB 11.6*  HCT 34.0*     Recent Labs  03/21/17 1328  NA 135  K 3.6  GLUCOSE 144*     Results for orders placed or performed during the hospital encounter of 03/21/17 (from the past 24 hour(s))  I-STAT 4, (NA,K, GLUC, HGB,HCT)     Status: Abnormal   Collection Time: 03/21/17  1:28 PM  Result Value Ref Range   Sodium 135 135 - 145 mmol/L   Potassium 3.6 3.5 - 5.1 mmol/L   Glucose, Bld 144 (H) 65 - 99 mg/dL   HCT 34.0 (L) 39.0 - 52.0 %   Hemoglobin 11.6 (L) 13.0 - 17.0 g/dL   No results found for this or any previous visit (from the past 240 hour(s)).  Renal Function: No results for input(s): CREATININE in the last 168 hours. Estimated Creatinine Clearance: 8.7 mL/min (A) (by C-G formula based on SCr of 13.61 mg/dL (H)).  Radiologic Imaging: No results found.  Impression/Assessment:  Continue scrotal infection/wound complications  Plan:  Scrotal wound debridement, drainage of any potential abscesses.

## 2017-03-21 NOTE — Anesthesia Procedure Notes (Signed)
Performed by: Wanita Chamberlain

## 2017-03-22 ENCOUNTER — Encounter (HOSPITAL_COMMUNITY): Payer: Self-pay | Admitting: Urology

## 2017-03-23 DIAGNOSIS — R7881 Bacteremia: Secondary | ICD-10-CM | POA: Diagnosis not present

## 2017-03-23 DIAGNOSIS — D509 Iron deficiency anemia, unspecified: Secondary | ICD-10-CM | POA: Diagnosis not present

## 2017-03-23 DIAGNOSIS — N2581 Secondary hyperparathyroidism of renal origin: Secondary | ICD-10-CM | POA: Diagnosis not present

## 2017-03-23 DIAGNOSIS — N186 End stage renal disease: Secondary | ICD-10-CM | POA: Diagnosis not present

## 2017-03-23 DIAGNOSIS — D631 Anemia in chronic kidney disease: Secondary | ICD-10-CM | POA: Diagnosis not present

## 2017-03-23 DIAGNOSIS — E119 Type 2 diabetes mellitus without complications: Secondary | ICD-10-CM | POA: Diagnosis not present

## 2017-03-24 DIAGNOSIS — N492 Inflammatory disorders of scrotum: Secondary | ICD-10-CM | POA: Diagnosis not present

## 2017-03-24 DIAGNOSIS — Z992 Dependence on renal dialysis: Secondary | ICD-10-CM | POA: Diagnosis not present

## 2017-03-24 DIAGNOSIS — Z794 Long term (current) use of insulin: Secondary | ICD-10-CM | POA: Diagnosis not present

## 2017-03-24 DIAGNOSIS — N186 End stage renal disease: Secondary | ICD-10-CM | POA: Diagnosis not present

## 2017-03-24 DIAGNOSIS — E1122 Type 2 diabetes mellitus with diabetic chronic kidney disease: Secondary | ICD-10-CM | POA: Diagnosis not present

## 2017-03-24 DIAGNOSIS — B9689 Other specified bacterial agents as the cause of diseases classified elsewhere: Secondary | ICD-10-CM | POA: Diagnosis not present

## 2017-03-25 DIAGNOSIS — E119 Type 2 diabetes mellitus without complications: Secondary | ICD-10-CM | POA: Diagnosis not present

## 2017-03-25 DIAGNOSIS — N2581 Secondary hyperparathyroidism of renal origin: Secondary | ICD-10-CM | POA: Diagnosis not present

## 2017-03-25 DIAGNOSIS — N186 End stage renal disease: Secondary | ICD-10-CM | POA: Diagnosis not present

## 2017-03-25 DIAGNOSIS — D509 Iron deficiency anemia, unspecified: Secondary | ICD-10-CM | POA: Diagnosis not present

## 2017-03-25 DIAGNOSIS — R7881 Bacteremia: Secondary | ICD-10-CM | POA: Diagnosis not present

## 2017-03-25 DIAGNOSIS — D631 Anemia in chronic kidney disease: Secondary | ICD-10-CM | POA: Diagnosis not present

## 2017-03-28 DIAGNOSIS — R7881 Bacteremia: Secondary | ICD-10-CM | POA: Diagnosis not present

## 2017-03-28 DIAGNOSIS — D509 Iron deficiency anemia, unspecified: Secondary | ICD-10-CM | POA: Diagnosis not present

## 2017-03-28 DIAGNOSIS — D631 Anemia in chronic kidney disease: Secondary | ICD-10-CM | POA: Diagnosis not present

## 2017-03-28 DIAGNOSIS — L02215 Cutaneous abscess of perineum: Secondary | ICD-10-CM | POA: Diagnosis not present

## 2017-03-28 DIAGNOSIS — E119 Type 2 diabetes mellitus without complications: Secondary | ICD-10-CM | POA: Diagnosis not present

## 2017-03-28 DIAGNOSIS — N2581 Secondary hyperparathyroidism of renal origin: Secondary | ICD-10-CM | POA: Diagnosis not present

## 2017-03-28 DIAGNOSIS — N186 End stage renal disease: Secondary | ICD-10-CM | POA: Diagnosis not present

## 2017-03-29 DIAGNOSIS — B9689 Other specified bacterial agents as the cause of diseases classified elsewhere: Secondary | ICD-10-CM | POA: Diagnosis not present

## 2017-03-29 DIAGNOSIS — Z992 Dependence on renal dialysis: Secondary | ICD-10-CM | POA: Diagnosis not present

## 2017-03-29 DIAGNOSIS — G4733 Obstructive sleep apnea (adult) (pediatric): Secondary | ICD-10-CM | POA: Diagnosis not present

## 2017-03-29 DIAGNOSIS — Z794 Long term (current) use of insulin: Secondary | ICD-10-CM | POA: Diagnosis not present

## 2017-03-29 DIAGNOSIS — N492 Inflammatory disorders of scrotum: Secondary | ICD-10-CM | POA: Diagnosis not present

## 2017-03-29 DIAGNOSIS — E1122 Type 2 diabetes mellitus with diabetic chronic kidney disease: Secondary | ICD-10-CM | POA: Diagnosis not present

## 2017-03-29 DIAGNOSIS — N186 End stage renal disease: Secondary | ICD-10-CM | POA: Diagnosis not present

## 2017-03-30 DIAGNOSIS — N186 End stage renal disease: Secondary | ICD-10-CM | POA: Diagnosis not present

## 2017-03-30 DIAGNOSIS — N2581 Secondary hyperparathyroidism of renal origin: Secondary | ICD-10-CM | POA: Diagnosis not present

## 2017-03-30 DIAGNOSIS — E119 Type 2 diabetes mellitus without complications: Secondary | ICD-10-CM | POA: Diagnosis not present

## 2017-03-30 DIAGNOSIS — D631 Anemia in chronic kidney disease: Secondary | ICD-10-CM | POA: Diagnosis not present

## 2017-03-30 DIAGNOSIS — D509 Iron deficiency anemia, unspecified: Secondary | ICD-10-CM | POA: Diagnosis not present

## 2017-03-30 DIAGNOSIS — R7881 Bacteremia: Secondary | ICD-10-CM | POA: Diagnosis not present

## 2017-03-31 DIAGNOSIS — B351 Tinea unguium: Secondary | ICD-10-CM | POA: Diagnosis not present

## 2017-03-31 DIAGNOSIS — N186 End stage renal disease: Secondary | ICD-10-CM | POA: Diagnosis not present

## 2017-03-31 DIAGNOSIS — I509 Heart failure, unspecified: Secondary | ICD-10-CM | POA: Diagnosis not present

## 2017-03-31 DIAGNOSIS — G4730 Sleep apnea, unspecified: Secondary | ICD-10-CM | POA: Diagnosis not present

## 2017-03-31 DIAGNOSIS — E782 Mixed hyperlipidemia: Secondary | ICD-10-CM | POA: Diagnosis not present

## 2017-03-31 DIAGNOSIS — M109 Gout, unspecified: Secondary | ICD-10-CM | POA: Diagnosis not present

## 2017-03-31 DIAGNOSIS — Z992 Dependence on renal dialysis: Secondary | ICD-10-CM | POA: Diagnosis not present

## 2017-03-31 DIAGNOSIS — I10 Essential (primary) hypertension: Secondary | ICD-10-CM | POA: Diagnosis not present

## 2017-03-31 DIAGNOSIS — D472 Monoclonal gammopathy: Secondary | ICD-10-CM | POA: Diagnosis not present

## 2017-03-31 DIAGNOSIS — E1122 Type 2 diabetes mellitus with diabetic chronic kidney disease: Secondary | ICD-10-CM | POA: Diagnosis not present

## 2017-03-31 DIAGNOSIS — Z794 Long term (current) use of insulin: Secondary | ICD-10-CM | POA: Diagnosis not present

## 2017-03-31 DIAGNOSIS — B9689 Other specified bacterial agents as the cause of diseases classified elsewhere: Secondary | ICD-10-CM | POA: Diagnosis not present

## 2017-03-31 DIAGNOSIS — R600 Localized edema: Secondary | ICD-10-CM | POA: Diagnosis not present

## 2017-03-31 DIAGNOSIS — E0842 Diabetes mellitus due to underlying condition with diabetic polyneuropathy: Secondary | ICD-10-CM | POA: Diagnosis not present

## 2017-03-31 DIAGNOSIS — N492 Inflammatory disorders of scrotum: Secondary | ICD-10-CM | POA: Diagnosis not present

## 2017-04-01 DIAGNOSIS — D509 Iron deficiency anemia, unspecified: Secondary | ICD-10-CM | POA: Diagnosis not present

## 2017-04-01 DIAGNOSIS — R7881 Bacteremia: Secondary | ICD-10-CM | POA: Diagnosis not present

## 2017-04-01 DIAGNOSIS — N186 End stage renal disease: Secondary | ICD-10-CM | POA: Diagnosis not present

## 2017-04-01 DIAGNOSIS — N2581 Secondary hyperparathyroidism of renal origin: Secondary | ICD-10-CM | POA: Diagnosis not present

## 2017-04-01 DIAGNOSIS — D631 Anemia in chronic kidney disease: Secondary | ICD-10-CM | POA: Diagnosis not present

## 2017-04-01 DIAGNOSIS — E119 Type 2 diabetes mellitus without complications: Secondary | ICD-10-CM | POA: Diagnosis not present

## 2017-04-04 DIAGNOSIS — D631 Anemia in chronic kidney disease: Secondary | ICD-10-CM | POA: Diagnosis not present

## 2017-04-04 DIAGNOSIS — N186 End stage renal disease: Secondary | ICD-10-CM | POA: Diagnosis not present

## 2017-04-04 DIAGNOSIS — D509 Iron deficiency anemia, unspecified: Secondary | ICD-10-CM | POA: Diagnosis not present

## 2017-04-04 DIAGNOSIS — R7881 Bacteremia: Secondary | ICD-10-CM | POA: Diagnosis not present

## 2017-04-04 DIAGNOSIS — N2581 Secondary hyperparathyroidism of renal origin: Secondary | ICD-10-CM | POA: Diagnosis not present

## 2017-04-04 DIAGNOSIS — E119 Type 2 diabetes mellitus without complications: Secondary | ICD-10-CM | POA: Diagnosis not present

## 2017-04-05 DIAGNOSIS — B9689 Other specified bacterial agents as the cause of diseases classified elsewhere: Secondary | ICD-10-CM | POA: Diagnosis not present

## 2017-04-05 DIAGNOSIS — N492 Inflammatory disorders of scrotum: Secondary | ICD-10-CM | POA: Diagnosis not present

## 2017-04-05 DIAGNOSIS — I871 Compression of vein: Secondary | ICD-10-CM | POA: Diagnosis not present

## 2017-04-05 DIAGNOSIS — T82858A Stenosis of vascular prosthetic devices, implants and grafts, initial encounter: Secondary | ICD-10-CM | POA: Diagnosis not present

## 2017-04-05 DIAGNOSIS — N186 End stage renal disease: Secondary | ICD-10-CM | POA: Diagnosis not present

## 2017-04-05 DIAGNOSIS — E1122 Type 2 diabetes mellitus with diabetic chronic kidney disease: Secondary | ICD-10-CM | POA: Diagnosis not present

## 2017-04-05 DIAGNOSIS — Z794 Long term (current) use of insulin: Secondary | ICD-10-CM | POA: Diagnosis not present

## 2017-04-05 DIAGNOSIS — Z992 Dependence on renal dialysis: Secondary | ICD-10-CM | POA: Diagnosis not present

## 2017-04-06 DIAGNOSIS — D631 Anemia in chronic kidney disease: Secondary | ICD-10-CM | POA: Diagnosis not present

## 2017-04-06 DIAGNOSIS — N2581 Secondary hyperparathyroidism of renal origin: Secondary | ICD-10-CM | POA: Diagnosis not present

## 2017-04-06 DIAGNOSIS — E119 Type 2 diabetes mellitus without complications: Secondary | ICD-10-CM | POA: Diagnosis not present

## 2017-04-06 DIAGNOSIS — D509 Iron deficiency anemia, unspecified: Secondary | ICD-10-CM | POA: Diagnosis not present

## 2017-04-06 DIAGNOSIS — N186 End stage renal disease: Secondary | ICD-10-CM | POA: Diagnosis not present

## 2017-04-06 DIAGNOSIS — R7881 Bacteremia: Secondary | ICD-10-CM | POA: Diagnosis not present

## 2017-04-07 DIAGNOSIS — E1122 Type 2 diabetes mellitus with diabetic chronic kidney disease: Secondary | ICD-10-CM | POA: Diagnosis not present

## 2017-04-07 DIAGNOSIS — N492 Inflammatory disorders of scrotum: Secondary | ICD-10-CM | POA: Diagnosis not present

## 2017-04-07 DIAGNOSIS — Z992 Dependence on renal dialysis: Secondary | ICD-10-CM | POA: Diagnosis not present

## 2017-04-07 DIAGNOSIS — N186 End stage renal disease: Secondary | ICD-10-CM | POA: Diagnosis not present

## 2017-04-07 DIAGNOSIS — Z794 Long term (current) use of insulin: Secondary | ICD-10-CM | POA: Diagnosis not present

## 2017-04-07 DIAGNOSIS — B9689 Other specified bacterial agents as the cause of diseases classified elsewhere: Secondary | ICD-10-CM | POA: Diagnosis not present

## 2017-04-08 DIAGNOSIS — E119 Type 2 diabetes mellitus without complications: Secondary | ICD-10-CM | POA: Diagnosis not present

## 2017-04-08 DIAGNOSIS — D631 Anemia in chronic kidney disease: Secondary | ICD-10-CM | POA: Diagnosis not present

## 2017-04-08 DIAGNOSIS — R7881 Bacteremia: Secondary | ICD-10-CM | POA: Diagnosis not present

## 2017-04-08 DIAGNOSIS — N186 End stage renal disease: Secondary | ICD-10-CM | POA: Diagnosis not present

## 2017-04-08 DIAGNOSIS — N2581 Secondary hyperparathyroidism of renal origin: Secondary | ICD-10-CM | POA: Diagnosis not present

## 2017-04-08 DIAGNOSIS — D509 Iron deficiency anemia, unspecified: Secondary | ICD-10-CM | POA: Diagnosis not present

## 2017-04-11 DIAGNOSIS — N2581 Secondary hyperparathyroidism of renal origin: Secondary | ICD-10-CM | POA: Diagnosis not present

## 2017-04-11 DIAGNOSIS — D631 Anemia in chronic kidney disease: Secondary | ICD-10-CM | POA: Diagnosis not present

## 2017-04-11 DIAGNOSIS — R7881 Bacteremia: Secondary | ICD-10-CM | POA: Diagnosis not present

## 2017-04-11 DIAGNOSIS — D509 Iron deficiency anemia, unspecified: Secondary | ICD-10-CM | POA: Diagnosis not present

## 2017-04-11 DIAGNOSIS — N186 End stage renal disease: Secondary | ICD-10-CM | POA: Diagnosis not present

## 2017-04-11 DIAGNOSIS — E119 Type 2 diabetes mellitus without complications: Secondary | ICD-10-CM | POA: Diagnosis not present

## 2017-04-12 DIAGNOSIS — Z992 Dependence on renal dialysis: Secondary | ICD-10-CM | POA: Diagnosis not present

## 2017-04-12 DIAGNOSIS — B9689 Other specified bacterial agents as the cause of diseases classified elsewhere: Secondary | ICD-10-CM | POA: Diagnosis not present

## 2017-04-12 DIAGNOSIS — N492 Inflammatory disorders of scrotum: Secondary | ICD-10-CM | POA: Diagnosis not present

## 2017-04-12 DIAGNOSIS — E1122 Type 2 diabetes mellitus with diabetic chronic kidney disease: Secondary | ICD-10-CM | POA: Diagnosis not present

## 2017-04-12 DIAGNOSIS — N186 End stage renal disease: Secondary | ICD-10-CM | POA: Diagnosis not present

## 2017-04-12 DIAGNOSIS — Z794 Long term (current) use of insulin: Secondary | ICD-10-CM | POA: Diagnosis not present

## 2017-04-13 DIAGNOSIS — L02215 Cutaneous abscess of perineum: Secondary | ICD-10-CM | POA: Diagnosis not present

## 2017-04-13 DIAGNOSIS — D509 Iron deficiency anemia, unspecified: Secondary | ICD-10-CM | POA: Diagnosis not present

## 2017-04-13 DIAGNOSIS — N2581 Secondary hyperparathyroidism of renal origin: Secondary | ICD-10-CM | POA: Diagnosis not present

## 2017-04-13 DIAGNOSIS — D631 Anemia in chronic kidney disease: Secondary | ICD-10-CM | POA: Diagnosis not present

## 2017-04-13 DIAGNOSIS — N186 End stage renal disease: Secondary | ICD-10-CM | POA: Diagnosis not present

## 2017-04-13 DIAGNOSIS — R7881 Bacteremia: Secondary | ICD-10-CM | POA: Diagnosis not present

## 2017-04-13 DIAGNOSIS — E119 Type 2 diabetes mellitus without complications: Secondary | ICD-10-CM | POA: Diagnosis not present

## 2017-04-14 DIAGNOSIS — N186 End stage renal disease: Secondary | ICD-10-CM | POA: Diagnosis not present

## 2017-04-14 DIAGNOSIS — E1129 Type 2 diabetes mellitus with other diabetic kidney complication: Secondary | ICD-10-CM | POA: Diagnosis not present

## 2017-04-14 DIAGNOSIS — Z992 Dependence on renal dialysis: Secondary | ICD-10-CM | POA: Diagnosis not present

## 2017-04-15 DIAGNOSIS — N2581 Secondary hyperparathyroidism of renal origin: Secondary | ICD-10-CM | POA: Diagnosis not present

## 2017-04-15 DIAGNOSIS — D631 Anemia in chronic kidney disease: Secondary | ICD-10-CM | POA: Diagnosis not present

## 2017-04-15 DIAGNOSIS — D509 Iron deficiency anemia, unspecified: Secondary | ICD-10-CM | POA: Diagnosis not present

## 2017-04-15 DIAGNOSIS — N186 End stage renal disease: Secondary | ICD-10-CM | POA: Diagnosis not present

## 2017-04-15 DIAGNOSIS — E119 Type 2 diabetes mellitus without complications: Secondary | ICD-10-CM | POA: Diagnosis not present

## 2017-04-15 DIAGNOSIS — Z794 Long term (current) use of insulin: Secondary | ICD-10-CM | POA: Diagnosis not present

## 2017-04-15 DIAGNOSIS — E1122 Type 2 diabetes mellitus with diabetic chronic kidney disease: Secondary | ICD-10-CM | POA: Diagnosis not present

## 2017-04-15 DIAGNOSIS — Z992 Dependence on renal dialysis: Secondary | ICD-10-CM | POA: Diagnosis not present

## 2017-04-15 DIAGNOSIS — B9689 Other specified bacterial agents as the cause of diseases classified elsewhere: Secondary | ICD-10-CM | POA: Diagnosis not present

## 2017-04-15 DIAGNOSIS — N492 Inflammatory disorders of scrotum: Secondary | ICD-10-CM | POA: Diagnosis not present

## 2017-04-16 NOTE — Addendum Note (Signed)
Addendum  created 04/16/17 1103 by Duane Boston, MD   Sign clinical note

## 2017-04-18 DIAGNOSIS — N2581 Secondary hyperparathyroidism of renal origin: Secondary | ICD-10-CM | POA: Diagnosis not present

## 2017-04-18 DIAGNOSIS — D631 Anemia in chronic kidney disease: Secondary | ICD-10-CM | POA: Diagnosis not present

## 2017-04-18 DIAGNOSIS — N186 End stage renal disease: Secondary | ICD-10-CM | POA: Diagnosis not present

## 2017-04-18 DIAGNOSIS — E119 Type 2 diabetes mellitus without complications: Secondary | ICD-10-CM | POA: Diagnosis not present

## 2017-04-18 DIAGNOSIS — D509 Iron deficiency anemia, unspecified: Secondary | ICD-10-CM | POA: Diagnosis not present

## 2017-04-19 ENCOUNTER — Other Ambulatory Visit: Payer: Medicare Other

## 2017-04-19 DIAGNOSIS — N186 End stage renal disease: Secondary | ICD-10-CM | POA: Diagnosis not present

## 2017-04-19 DIAGNOSIS — Z794 Long term (current) use of insulin: Secondary | ICD-10-CM | POA: Diagnosis not present

## 2017-04-19 DIAGNOSIS — B182 Chronic viral hepatitis C: Secondary | ICD-10-CM | POA: Diagnosis not present

## 2017-04-19 DIAGNOSIS — B9689 Other specified bacterial agents as the cause of diseases classified elsewhere: Secondary | ICD-10-CM | POA: Diagnosis not present

## 2017-04-19 DIAGNOSIS — Z992 Dependence on renal dialysis: Secondary | ICD-10-CM | POA: Diagnosis not present

## 2017-04-19 DIAGNOSIS — E1122 Type 2 diabetes mellitus with diabetic chronic kidney disease: Secondary | ICD-10-CM | POA: Diagnosis not present

## 2017-04-19 DIAGNOSIS — N492 Inflammatory disorders of scrotum: Secondary | ICD-10-CM | POA: Diagnosis not present

## 2017-04-20 DIAGNOSIS — D631 Anemia in chronic kidney disease: Secondary | ICD-10-CM | POA: Diagnosis not present

## 2017-04-20 DIAGNOSIS — D509 Iron deficiency anemia, unspecified: Secondary | ICD-10-CM | POA: Diagnosis not present

## 2017-04-20 DIAGNOSIS — E119 Type 2 diabetes mellitus without complications: Secondary | ICD-10-CM | POA: Diagnosis not present

## 2017-04-20 DIAGNOSIS — N186 End stage renal disease: Secondary | ICD-10-CM | POA: Diagnosis not present

## 2017-04-20 DIAGNOSIS — N2581 Secondary hyperparathyroidism of renal origin: Secondary | ICD-10-CM | POA: Diagnosis not present

## 2017-04-21 DIAGNOSIS — Z794 Long term (current) use of insulin: Secondary | ICD-10-CM | POA: Diagnosis not present

## 2017-04-21 DIAGNOSIS — N186 End stage renal disease: Secondary | ICD-10-CM | POA: Diagnosis not present

## 2017-04-21 DIAGNOSIS — N492 Inflammatory disorders of scrotum: Secondary | ICD-10-CM | POA: Diagnosis not present

## 2017-04-21 DIAGNOSIS — B9689 Other specified bacterial agents as the cause of diseases classified elsewhere: Secondary | ICD-10-CM | POA: Diagnosis not present

## 2017-04-21 DIAGNOSIS — Z992 Dependence on renal dialysis: Secondary | ICD-10-CM | POA: Diagnosis not present

## 2017-04-21 DIAGNOSIS — E1122 Type 2 diabetes mellitus with diabetic chronic kidney disease: Secondary | ICD-10-CM | POA: Diagnosis not present

## 2017-04-21 LAB — HEPATITIS C RNA QUANTITATIVE
HCV QUANT: NOT DETECTED [IU]/mL
HCV Quantitative Log: <1.18 Log IU/mL

## 2017-04-22 DIAGNOSIS — D631 Anemia in chronic kidney disease: Secondary | ICD-10-CM | POA: Diagnosis not present

## 2017-04-22 DIAGNOSIS — D509 Iron deficiency anemia, unspecified: Secondary | ICD-10-CM | POA: Diagnosis not present

## 2017-04-22 DIAGNOSIS — E119 Type 2 diabetes mellitus without complications: Secondary | ICD-10-CM | POA: Diagnosis not present

## 2017-04-22 DIAGNOSIS — N186 End stage renal disease: Secondary | ICD-10-CM | POA: Diagnosis not present

## 2017-04-22 DIAGNOSIS — N2581 Secondary hyperparathyroidism of renal origin: Secondary | ICD-10-CM | POA: Diagnosis not present

## 2017-04-25 DIAGNOSIS — D509 Iron deficiency anemia, unspecified: Secondary | ICD-10-CM | POA: Diagnosis not present

## 2017-04-25 DIAGNOSIS — N186 End stage renal disease: Secondary | ICD-10-CM | POA: Diagnosis not present

## 2017-04-25 DIAGNOSIS — D631 Anemia in chronic kidney disease: Secondary | ICD-10-CM | POA: Diagnosis not present

## 2017-04-25 DIAGNOSIS — E119 Type 2 diabetes mellitus without complications: Secondary | ICD-10-CM | POA: Diagnosis not present

## 2017-04-25 DIAGNOSIS — N2581 Secondary hyperparathyroidism of renal origin: Secondary | ICD-10-CM | POA: Diagnosis not present

## 2017-04-26 ENCOUNTER — Ambulatory Visit: Payer: Medicare Other

## 2017-04-26 DIAGNOSIS — Z992 Dependence on renal dialysis: Secondary | ICD-10-CM | POA: Diagnosis not present

## 2017-04-26 DIAGNOSIS — Z794 Long term (current) use of insulin: Secondary | ICD-10-CM | POA: Diagnosis not present

## 2017-04-26 DIAGNOSIS — N186 End stage renal disease: Secondary | ICD-10-CM | POA: Diagnosis not present

## 2017-04-26 DIAGNOSIS — E1122 Type 2 diabetes mellitus with diabetic chronic kidney disease: Secondary | ICD-10-CM | POA: Diagnosis not present

## 2017-04-26 DIAGNOSIS — B9689 Other specified bacterial agents as the cause of diseases classified elsewhere: Secondary | ICD-10-CM | POA: Diagnosis not present

## 2017-04-26 DIAGNOSIS — N492 Inflammatory disorders of scrotum: Secondary | ICD-10-CM | POA: Diagnosis not present

## 2017-04-27 DIAGNOSIS — E119 Type 2 diabetes mellitus without complications: Secondary | ICD-10-CM | POA: Diagnosis not present

## 2017-04-27 DIAGNOSIS — D631 Anemia in chronic kidney disease: Secondary | ICD-10-CM | POA: Diagnosis not present

## 2017-04-27 DIAGNOSIS — D509 Iron deficiency anemia, unspecified: Secondary | ICD-10-CM | POA: Diagnosis not present

## 2017-04-27 DIAGNOSIS — N186 End stage renal disease: Secondary | ICD-10-CM | POA: Diagnosis not present

## 2017-04-27 DIAGNOSIS — N2581 Secondary hyperparathyroidism of renal origin: Secondary | ICD-10-CM | POA: Diagnosis not present

## 2017-04-28 DIAGNOSIS — Z992 Dependence on renal dialysis: Secondary | ICD-10-CM | POA: Diagnosis not present

## 2017-04-28 DIAGNOSIS — N492 Inflammatory disorders of scrotum: Secondary | ICD-10-CM | POA: Diagnosis not present

## 2017-04-28 DIAGNOSIS — Z794 Long term (current) use of insulin: Secondary | ICD-10-CM | POA: Diagnosis not present

## 2017-04-28 DIAGNOSIS — E1122 Type 2 diabetes mellitus with diabetic chronic kidney disease: Secondary | ICD-10-CM | POA: Diagnosis not present

## 2017-04-28 DIAGNOSIS — B9689 Other specified bacterial agents as the cause of diseases classified elsewhere: Secondary | ICD-10-CM | POA: Diagnosis not present

## 2017-04-28 DIAGNOSIS — N186 End stage renal disease: Secondary | ICD-10-CM | POA: Diagnosis not present

## 2017-04-29 DIAGNOSIS — N2581 Secondary hyperparathyroidism of renal origin: Secondary | ICD-10-CM | POA: Diagnosis not present

## 2017-04-29 DIAGNOSIS — E119 Type 2 diabetes mellitus without complications: Secondary | ICD-10-CM | POA: Diagnosis not present

## 2017-04-29 DIAGNOSIS — D631 Anemia in chronic kidney disease: Secondary | ICD-10-CM | POA: Diagnosis not present

## 2017-04-29 DIAGNOSIS — D509 Iron deficiency anemia, unspecified: Secondary | ICD-10-CM | POA: Diagnosis not present

## 2017-04-29 DIAGNOSIS — N186 End stage renal disease: Secondary | ICD-10-CM | POA: Diagnosis not present

## 2017-05-02 DIAGNOSIS — D509 Iron deficiency anemia, unspecified: Secondary | ICD-10-CM | POA: Diagnosis not present

## 2017-05-02 DIAGNOSIS — N2581 Secondary hyperparathyroidism of renal origin: Secondary | ICD-10-CM | POA: Diagnosis not present

## 2017-05-02 DIAGNOSIS — D631 Anemia in chronic kidney disease: Secondary | ICD-10-CM | POA: Diagnosis not present

## 2017-05-02 DIAGNOSIS — E119 Type 2 diabetes mellitus without complications: Secondary | ICD-10-CM | POA: Diagnosis not present

## 2017-05-02 DIAGNOSIS — N186 End stage renal disease: Secondary | ICD-10-CM | POA: Diagnosis not present

## 2017-05-03 DIAGNOSIS — Z794 Long term (current) use of insulin: Secondary | ICD-10-CM | POA: Diagnosis not present

## 2017-05-03 DIAGNOSIS — N186 End stage renal disease: Secondary | ICD-10-CM | POA: Diagnosis not present

## 2017-05-03 DIAGNOSIS — Z992 Dependence on renal dialysis: Secondary | ICD-10-CM | POA: Diagnosis not present

## 2017-05-03 DIAGNOSIS — E1122 Type 2 diabetes mellitus with diabetic chronic kidney disease: Secondary | ICD-10-CM | POA: Diagnosis not present

## 2017-05-03 DIAGNOSIS — B9689 Other specified bacterial agents as the cause of diseases classified elsewhere: Secondary | ICD-10-CM | POA: Diagnosis not present

## 2017-05-03 DIAGNOSIS — N492 Inflammatory disorders of scrotum: Secondary | ICD-10-CM | POA: Diagnosis not present

## 2017-05-04 DIAGNOSIS — D631 Anemia in chronic kidney disease: Secondary | ICD-10-CM | POA: Diagnosis not present

## 2017-05-04 DIAGNOSIS — N2581 Secondary hyperparathyroidism of renal origin: Secondary | ICD-10-CM | POA: Diagnosis not present

## 2017-05-04 DIAGNOSIS — E119 Type 2 diabetes mellitus without complications: Secondary | ICD-10-CM | POA: Diagnosis not present

## 2017-05-04 DIAGNOSIS — D509 Iron deficiency anemia, unspecified: Secondary | ICD-10-CM | POA: Diagnosis not present

## 2017-05-04 DIAGNOSIS — N186 End stage renal disease: Secondary | ICD-10-CM | POA: Diagnosis not present

## 2017-05-05 DIAGNOSIS — N492 Inflammatory disorders of scrotum: Secondary | ICD-10-CM | POA: Diagnosis not present

## 2017-05-05 DIAGNOSIS — Z794 Long term (current) use of insulin: Secondary | ICD-10-CM | POA: Diagnosis not present

## 2017-05-05 DIAGNOSIS — E1122 Type 2 diabetes mellitus with diabetic chronic kidney disease: Secondary | ICD-10-CM | POA: Diagnosis not present

## 2017-05-05 DIAGNOSIS — B9689 Other specified bacterial agents as the cause of diseases classified elsewhere: Secondary | ICD-10-CM | POA: Diagnosis not present

## 2017-05-05 DIAGNOSIS — N186 End stage renal disease: Secondary | ICD-10-CM | POA: Diagnosis not present

## 2017-05-05 DIAGNOSIS — Z992 Dependence on renal dialysis: Secondary | ICD-10-CM | POA: Diagnosis not present

## 2017-05-06 DIAGNOSIS — D509 Iron deficiency anemia, unspecified: Secondary | ICD-10-CM | POA: Diagnosis not present

## 2017-05-06 DIAGNOSIS — D631 Anemia in chronic kidney disease: Secondary | ICD-10-CM | POA: Diagnosis not present

## 2017-05-06 DIAGNOSIS — N186 End stage renal disease: Secondary | ICD-10-CM | POA: Diagnosis not present

## 2017-05-06 DIAGNOSIS — N2581 Secondary hyperparathyroidism of renal origin: Secondary | ICD-10-CM | POA: Diagnosis not present

## 2017-05-06 DIAGNOSIS — E119 Type 2 diabetes mellitus without complications: Secondary | ICD-10-CM | POA: Diagnosis not present

## 2017-05-09 DIAGNOSIS — D631 Anemia in chronic kidney disease: Secondary | ICD-10-CM | POA: Diagnosis not present

## 2017-05-09 DIAGNOSIS — E119 Type 2 diabetes mellitus without complications: Secondary | ICD-10-CM | POA: Diagnosis not present

## 2017-05-09 DIAGNOSIS — N2581 Secondary hyperparathyroidism of renal origin: Secondary | ICD-10-CM | POA: Diagnosis not present

## 2017-05-09 DIAGNOSIS — N186 End stage renal disease: Secondary | ICD-10-CM | POA: Diagnosis not present

## 2017-05-09 DIAGNOSIS — D509 Iron deficiency anemia, unspecified: Secondary | ICD-10-CM | POA: Diagnosis not present

## 2017-05-10 ENCOUNTER — Ambulatory Visit (INDEPENDENT_AMBULATORY_CARE_PROVIDER_SITE_OTHER): Payer: Self-pay | Admitting: Podiatry

## 2017-05-10 DIAGNOSIS — R200 Anesthesia of skin: Secondary | ICD-10-CM | POA: Diagnosis not present

## 2017-05-10 DIAGNOSIS — E1142 Type 2 diabetes mellitus with diabetic polyneuropathy: Secondary | ICD-10-CM | POA: Diagnosis not present

## 2017-05-10 DIAGNOSIS — R202 Paresthesia of skin: Secondary | ICD-10-CM | POA: Diagnosis not present

## 2017-05-10 DIAGNOSIS — L84 Corns and callosities: Secondary | ICD-10-CM | POA: Diagnosis not present

## 2017-05-10 DIAGNOSIS — E11628 Type 2 diabetes mellitus with other skin complications: Secondary | ICD-10-CM | POA: Diagnosis not present

## 2017-05-10 DIAGNOSIS — M79641 Pain in right hand: Secondary | ICD-10-CM | POA: Insufficient documentation

## 2017-05-11 DIAGNOSIS — E119 Type 2 diabetes mellitus without complications: Secondary | ICD-10-CM | POA: Diagnosis not present

## 2017-05-11 DIAGNOSIS — D509 Iron deficiency anemia, unspecified: Secondary | ICD-10-CM | POA: Diagnosis not present

## 2017-05-11 DIAGNOSIS — N2581 Secondary hyperparathyroidism of renal origin: Secondary | ICD-10-CM | POA: Diagnosis not present

## 2017-05-11 DIAGNOSIS — D631 Anemia in chronic kidney disease: Secondary | ICD-10-CM | POA: Diagnosis not present

## 2017-05-11 DIAGNOSIS — N186 End stage renal disease: Secondary | ICD-10-CM | POA: Diagnosis not present

## 2017-05-13 DIAGNOSIS — D509 Iron deficiency anemia, unspecified: Secondary | ICD-10-CM | POA: Diagnosis not present

## 2017-05-13 DIAGNOSIS — N2581 Secondary hyperparathyroidism of renal origin: Secondary | ICD-10-CM | POA: Diagnosis not present

## 2017-05-13 DIAGNOSIS — E119 Type 2 diabetes mellitus without complications: Secondary | ICD-10-CM | POA: Diagnosis not present

## 2017-05-13 DIAGNOSIS — N186 End stage renal disease: Secondary | ICD-10-CM | POA: Diagnosis not present

## 2017-05-13 DIAGNOSIS — D631 Anemia in chronic kidney disease: Secondary | ICD-10-CM | POA: Diagnosis not present

## 2017-05-14 DIAGNOSIS — E1129 Type 2 diabetes mellitus with other diabetic kidney complication: Secondary | ICD-10-CM | POA: Diagnosis not present

## 2017-05-14 DIAGNOSIS — Z992 Dependence on renal dialysis: Secondary | ICD-10-CM | POA: Diagnosis not present

## 2017-05-14 DIAGNOSIS — N186 End stage renal disease: Secondary | ICD-10-CM | POA: Diagnosis not present

## 2017-05-16 DIAGNOSIS — E119 Type 2 diabetes mellitus without complications: Secondary | ICD-10-CM | POA: Diagnosis not present

## 2017-05-16 DIAGNOSIS — D509 Iron deficiency anemia, unspecified: Secondary | ICD-10-CM | POA: Diagnosis not present

## 2017-05-16 DIAGNOSIS — D631 Anemia in chronic kidney disease: Secondary | ICD-10-CM | POA: Diagnosis not present

## 2017-05-16 DIAGNOSIS — N186 End stage renal disease: Secondary | ICD-10-CM | POA: Diagnosis not present

## 2017-05-16 DIAGNOSIS — N2581 Secondary hyperparathyroidism of renal origin: Secondary | ICD-10-CM | POA: Diagnosis not present

## 2017-05-16 NOTE — Progress Notes (Signed)
Patient came in today to pick up diabetic shoes and custom inserts.  Same was well pleased with fit and function.   The patient could ambulate without any discomfort; there were no signs of any quality issues. The foot ortheses offered full contact with plantar surface and contoured the arch well.   The shoes fit well with no heel slippage and areas of pressure concern.   Patient advised to contact us if any problems arise.  Patient also advised on how to report any issues.  Shoes dispensed Apex Lincoln T900M and 3 EVA 450 diabtic inserts.

## 2017-05-17 DIAGNOSIS — G5601 Carpal tunnel syndrome, right upper limb: Secondary | ICD-10-CM | POA: Diagnosis not present

## 2017-05-17 DIAGNOSIS — G6289 Other specified polyneuropathies: Secondary | ICD-10-CM | POA: Diagnosis not present

## 2017-05-18 DIAGNOSIS — D509 Iron deficiency anemia, unspecified: Secondary | ICD-10-CM | POA: Diagnosis not present

## 2017-05-18 DIAGNOSIS — N2581 Secondary hyperparathyroidism of renal origin: Secondary | ICD-10-CM | POA: Diagnosis not present

## 2017-05-18 DIAGNOSIS — D631 Anemia in chronic kidney disease: Secondary | ICD-10-CM | POA: Diagnosis not present

## 2017-05-18 DIAGNOSIS — N186 End stage renal disease: Secondary | ICD-10-CM | POA: Diagnosis not present

## 2017-05-18 DIAGNOSIS — E119 Type 2 diabetes mellitus without complications: Secondary | ICD-10-CM | POA: Diagnosis not present

## 2017-05-19 DIAGNOSIS — G5601 Carpal tunnel syndrome, right upper limb: Secondary | ICD-10-CM | POA: Insufficient documentation

## 2017-05-19 DIAGNOSIS — M79641 Pain in right hand: Secondary | ICD-10-CM | POA: Diagnosis not present

## 2017-05-20 DIAGNOSIS — N186 End stage renal disease: Secondary | ICD-10-CM | POA: Diagnosis not present

## 2017-05-20 DIAGNOSIS — E119 Type 2 diabetes mellitus without complications: Secondary | ICD-10-CM | POA: Diagnosis not present

## 2017-05-20 DIAGNOSIS — D631 Anemia in chronic kidney disease: Secondary | ICD-10-CM | POA: Diagnosis not present

## 2017-05-20 DIAGNOSIS — N2581 Secondary hyperparathyroidism of renal origin: Secondary | ICD-10-CM | POA: Diagnosis not present

## 2017-05-20 DIAGNOSIS — D509 Iron deficiency anemia, unspecified: Secondary | ICD-10-CM | POA: Diagnosis not present

## 2017-05-23 DIAGNOSIS — D631 Anemia in chronic kidney disease: Secondary | ICD-10-CM | POA: Diagnosis not present

## 2017-05-23 DIAGNOSIS — D509 Iron deficiency anemia, unspecified: Secondary | ICD-10-CM | POA: Diagnosis not present

## 2017-05-23 DIAGNOSIS — N186 End stage renal disease: Secondary | ICD-10-CM | POA: Diagnosis not present

## 2017-05-23 DIAGNOSIS — N2581 Secondary hyperparathyroidism of renal origin: Secondary | ICD-10-CM | POA: Diagnosis not present

## 2017-05-23 DIAGNOSIS — E119 Type 2 diabetes mellitus without complications: Secondary | ICD-10-CM | POA: Diagnosis not present

## 2017-05-24 ENCOUNTER — Other Ambulatory Visit: Payer: Medicare Other

## 2017-05-24 ENCOUNTER — Ambulatory Visit (INDEPENDENT_AMBULATORY_CARE_PROVIDER_SITE_OTHER): Payer: Medicare Other | Admitting: Podiatry

## 2017-05-24 ENCOUNTER — Encounter: Payer: Self-pay | Admitting: Podiatry

## 2017-05-24 DIAGNOSIS — E1142 Type 2 diabetes mellitus with diabetic polyneuropathy: Secondary | ICD-10-CM | POA: Diagnosis not present

## 2017-05-24 DIAGNOSIS — M79675 Pain in left toe(s): Secondary | ICD-10-CM

## 2017-05-24 DIAGNOSIS — M79674 Pain in right toe(s): Secondary | ICD-10-CM | POA: Diagnosis not present

## 2017-05-24 DIAGNOSIS — B351 Tinea unguium: Secondary | ICD-10-CM | POA: Diagnosis not present

## 2017-05-24 NOTE — Patient Instructions (Signed)
Remove the Band-Aid in the second right toe 1-3 days and continue to apply topical antibiotic ointment and Band-Aid daily until a scab forms  Diabetes and Foot Care Diabetes may cause you to have problems because of poor blood supply (circulation) to your feet and legs. This may cause the skin on your feet to become thinner, break easier, and heal more slowly. Your skin may become dry, and the skin may peel and crack. You may also have nerve damage in your legs and feet causing decreased feeling in them. You may not notice minor injuries to your feet that could lead to infections or more serious problems. Taking care of your feet is one of the most important things you can do for yourself. Follow these instructions at home:  Wear shoes at all times, even in the house. Do not go barefoot. Bare feet are easily injured.  Check your feet daily for blisters, cuts, and redness. If you cannot see the bottom of your feet, use a mirror or ask someone for help.  Wash your feet with warm water (do not use hot water) and mild soap. Then pat your feet and the areas between your toes until they are completely dry. Do not soak your feet as this can dry your skin.  Apply a moisturizing lotion or petroleum jelly (that does not contain alcohol and is unscented) to the skin on your feet and to dry, brittle toenails. Do not apply lotion between your toes.  Trim your toenails straight across. Do not dig under them or around the cuticle. File the edges of your nails with an emery board or nail file.  Do not cut corns or calluses or try to remove them with medicine.  Wear clean socks or stockings every day. Make sure they are not too tight. Do not wear knee-high stockings since they may decrease blood flow to your legs.  Wear shoes that fit properly and have enough cushioning. To break in new shoes, wear them for just a few hours a day. This prevents you from injuring your feet. Always look in your shoes before you put  them on to be sure there are no objects inside.  Do not cross your legs. This may decrease the blood flow to your feet.  If you find a minor scrape, cut, or break in the skin on your feet, keep it and the skin around it clean and dry. These areas may be cleansed with mild soap and water. Do not cleanse the area with peroxide, alcohol, or iodine.  When you remove an adhesive bandage, be sure not to damage the skin around it.  If you have a wound, look at it several times a day to make sure it is healing.  Do not use heating pads or hot water bottles. They may burn your skin. If you have lost feeling in your feet or legs, you may not know it is happening until it is too late.  Make sure your health care provider performs a complete foot exam at least annually or more often if you have foot problems. Report any cuts, sores, or bruises to your health care provider immediately. Contact a health care provider if:  You have an injury that is not healing.  You have cuts or breaks in the skin.  You have an ingrown nail.  You notice redness on your legs or feet.  You feel burning or tingling in your legs or feet.  You have pain or cramps in your  legs and feet.  Your legs or feet are numb.  Your feet always feel cold. Get help right away if:  There is increasing redness, swelling, or pain in or around a wound.  There is a red line that goes up your leg.  Pus is coming from a wound.  You develop a fever or as directed by your health care provider.  You notice a bad smell coming from an ulcer or wound. This information is not intended to replace advice given to you by your health care provider. Make sure you discuss any questions you have with your health care provider. Document Released: 10/29/2000 Document Revised: 04/08/2016 Document Reviewed: 04/10/2013 Elsevier Interactive Patient Education  2017 [name].

## 2017-05-24 NOTE — Progress Notes (Signed)
Patient ID: [name], male   DOB: September 28, 1956, 61 y.o.   MRN: [id number]    Subjective: This patient presents for scheduled visit complaining of uncomfortable toenails walking wearing shoes and requests toenail debridement. He has received his diabetic shoes and is wearing without any complaints  Objective:  Orientated 3  Vascular: Mild lower extremity edema with mild calf tenderness left Mild lower extremity edema right DP pulses 1/4 bilaterally PT pulses 1/4 bilaterally Capillary reflex within normal limits  Neurological: Sensation to 10 g monofilament wire intact 2/5 bilaterally Vibratory sensation nonreactive bilaterally Ankle reflexes equal and reactive bilaterally  Dermatological: No open skin lesions bilaterally Atrophic, shiny skin with absent hair growth bilaterally The toenails are hypertrophic, elongated, discolored, deformed and tender to direct palpation 6-10  Musculoskeletal: There is no restriction ankle, subtalar, midtarsal joints bilaterally Manual motor testing dorsi flexion, plantar flexion, inversion, eversion 5/5 bilaterally  Assessment: Peripheral arterial disease associated with decreased pulses Diabetic peripheral neuropathy associated with decreased sensation Edema left lower leg has been evaluated and a DVT has been ruled out,per patient Mycotic toenails 6-10  Plan: Debridement of toenails 6-10 mechanically and electrically with slight bleeding distal second right toe treated with topical antibiotic ointment and Band-Aid. Patient instructed removed Band-Aid 1-3 days and continue apply topical antibiotic ointment and Band-Aid daily until a scab forms  Reappoint 3 months

## 2017-05-25 DIAGNOSIS — D509 Iron deficiency anemia, unspecified: Secondary | ICD-10-CM | POA: Diagnosis not present

## 2017-05-25 DIAGNOSIS — N186 End stage renal disease: Secondary | ICD-10-CM | POA: Diagnosis not present

## 2017-05-25 DIAGNOSIS — E119 Type 2 diabetes mellitus without complications: Secondary | ICD-10-CM | POA: Diagnosis not present

## 2017-05-25 DIAGNOSIS — N2581 Secondary hyperparathyroidism of renal origin: Secondary | ICD-10-CM | POA: Diagnosis not present

## 2017-05-25 DIAGNOSIS — D631 Anemia in chronic kidney disease: Secondary | ICD-10-CM | POA: Diagnosis not present

## 2017-05-27 DIAGNOSIS — N186 End stage renal disease: Secondary | ICD-10-CM | POA: Diagnosis not present

## 2017-05-27 DIAGNOSIS — N2581 Secondary hyperparathyroidism of renal origin: Secondary | ICD-10-CM | POA: Diagnosis not present

## 2017-05-27 DIAGNOSIS — D631 Anemia in chronic kidney disease: Secondary | ICD-10-CM | POA: Diagnosis not present

## 2017-05-27 DIAGNOSIS — D509 Iron deficiency anemia, unspecified: Secondary | ICD-10-CM | POA: Diagnosis not present

## 2017-05-27 DIAGNOSIS — E119 Type 2 diabetes mellitus without complications: Secondary | ICD-10-CM | POA: Diagnosis not present

## 2017-05-30 DIAGNOSIS — N186 End stage renal disease: Secondary | ICD-10-CM | POA: Diagnosis not present

## 2017-05-30 DIAGNOSIS — D509 Iron deficiency anemia, unspecified: Secondary | ICD-10-CM | POA: Diagnosis not present

## 2017-05-30 DIAGNOSIS — N2581 Secondary hyperparathyroidism of renal origin: Secondary | ICD-10-CM | POA: Diagnosis not present

## 2017-05-30 DIAGNOSIS — D631 Anemia in chronic kidney disease: Secondary | ICD-10-CM | POA: Diagnosis not present

## 2017-05-30 DIAGNOSIS — E119 Type 2 diabetes mellitus without complications: Secondary | ICD-10-CM | POA: Diagnosis not present

## 2017-06-01 DIAGNOSIS — D509 Iron deficiency anemia, unspecified: Secondary | ICD-10-CM | POA: Diagnosis not present

## 2017-06-01 DIAGNOSIS — D631 Anemia in chronic kidney disease: Secondary | ICD-10-CM | POA: Diagnosis not present

## 2017-06-01 DIAGNOSIS — E119 Type 2 diabetes mellitus without complications: Secondary | ICD-10-CM | POA: Diagnosis not present

## 2017-06-01 DIAGNOSIS — N186 End stage renal disease: Secondary | ICD-10-CM | POA: Diagnosis not present

## 2017-06-01 DIAGNOSIS — N2581 Secondary hyperparathyroidism of renal origin: Secondary | ICD-10-CM | POA: Diagnosis not present

## 2017-06-02 DIAGNOSIS — G5601 Carpal tunnel syndrome, right upper limb: Secondary | ICD-10-CM | POA: Diagnosis not present

## 2017-06-03 DIAGNOSIS — D509 Iron deficiency anemia, unspecified: Secondary | ICD-10-CM | POA: Diagnosis not present

## 2017-06-03 DIAGNOSIS — E119 Type 2 diabetes mellitus without complications: Secondary | ICD-10-CM | POA: Diagnosis not present

## 2017-06-03 DIAGNOSIS — D631 Anemia in chronic kidney disease: Secondary | ICD-10-CM | POA: Diagnosis not present

## 2017-06-03 DIAGNOSIS — N2581 Secondary hyperparathyroidism of renal origin: Secondary | ICD-10-CM | POA: Diagnosis not present

## 2017-06-03 DIAGNOSIS — N186 End stage renal disease: Secondary | ICD-10-CM | POA: Diagnosis not present

## 2017-06-06 DIAGNOSIS — D631 Anemia in chronic kidney disease: Secondary | ICD-10-CM | POA: Diagnosis not present

## 2017-06-06 DIAGNOSIS — D509 Iron deficiency anemia, unspecified: Secondary | ICD-10-CM | POA: Diagnosis not present

## 2017-06-06 DIAGNOSIS — E119 Type 2 diabetes mellitus without complications: Secondary | ICD-10-CM | POA: Diagnosis not present

## 2017-06-06 DIAGNOSIS — N186 End stage renal disease: Secondary | ICD-10-CM | POA: Diagnosis not present

## 2017-06-06 DIAGNOSIS — N2581 Secondary hyperparathyroidism of renal origin: Secondary | ICD-10-CM | POA: Diagnosis not present

## 2017-06-08 DIAGNOSIS — D509 Iron deficiency anemia, unspecified: Secondary | ICD-10-CM | POA: Diagnosis not present

## 2017-06-08 DIAGNOSIS — E119 Type 2 diabetes mellitus without complications: Secondary | ICD-10-CM | POA: Diagnosis not present

## 2017-06-08 DIAGNOSIS — N2581 Secondary hyperparathyroidism of renal origin: Secondary | ICD-10-CM | POA: Diagnosis not present

## 2017-06-08 DIAGNOSIS — N186 End stage renal disease: Secondary | ICD-10-CM | POA: Diagnosis not present

## 2017-06-08 DIAGNOSIS — D631 Anemia in chronic kidney disease: Secondary | ICD-10-CM | POA: Diagnosis not present

## 2017-06-10 DIAGNOSIS — N2581 Secondary hyperparathyroidism of renal origin: Secondary | ICD-10-CM | POA: Diagnosis not present

## 2017-06-10 DIAGNOSIS — D509 Iron deficiency anemia, unspecified: Secondary | ICD-10-CM | POA: Diagnosis not present

## 2017-06-10 DIAGNOSIS — N186 End stage renal disease: Secondary | ICD-10-CM | POA: Diagnosis not present

## 2017-06-10 DIAGNOSIS — D631 Anemia in chronic kidney disease: Secondary | ICD-10-CM | POA: Diagnosis not present

## 2017-06-10 DIAGNOSIS — E119 Type 2 diabetes mellitus without complications: Secondary | ICD-10-CM | POA: Diagnosis not present

## 2017-06-13 DIAGNOSIS — D509 Iron deficiency anemia, unspecified: Secondary | ICD-10-CM | POA: Diagnosis not present

## 2017-06-13 DIAGNOSIS — N2581 Secondary hyperparathyroidism of renal origin: Secondary | ICD-10-CM | POA: Diagnosis not present

## 2017-06-13 DIAGNOSIS — E119 Type 2 diabetes mellitus without complications: Secondary | ICD-10-CM | POA: Diagnosis not present

## 2017-06-13 DIAGNOSIS — D631 Anemia in chronic kidney disease: Secondary | ICD-10-CM | POA: Diagnosis not present

## 2017-06-13 DIAGNOSIS — N186 End stage renal disease: Secondary | ICD-10-CM | POA: Diagnosis not present

## 2017-06-14 DIAGNOSIS — N186 End stage renal disease: Secondary | ICD-10-CM | POA: Diagnosis not present

## 2017-06-14 DIAGNOSIS — E1129 Type 2 diabetes mellitus with other diabetic kidney complication: Secondary | ICD-10-CM | POA: Diagnosis not present

## 2017-06-14 DIAGNOSIS — Z992 Dependence on renal dialysis: Secondary | ICD-10-CM | POA: Diagnosis not present

## 2017-06-15 DIAGNOSIS — D509 Iron deficiency anemia, unspecified: Secondary | ICD-10-CM | POA: Diagnosis not present

## 2017-06-15 DIAGNOSIS — N2581 Secondary hyperparathyroidism of renal origin: Secondary | ICD-10-CM | POA: Diagnosis not present

## 2017-06-15 DIAGNOSIS — N186 End stage renal disease: Secondary | ICD-10-CM | POA: Diagnosis not present

## 2017-06-15 DIAGNOSIS — Z23 Encounter for immunization: Secondary | ICD-10-CM | POA: Diagnosis not present

## 2017-06-15 DIAGNOSIS — E119 Type 2 diabetes mellitus without complications: Secondary | ICD-10-CM | POA: Diagnosis not present

## 2017-06-17 DIAGNOSIS — Z23 Encounter for immunization: Secondary | ICD-10-CM | POA: Diagnosis not present

## 2017-06-17 DIAGNOSIS — N186 End stage renal disease: Secondary | ICD-10-CM | POA: Diagnosis not present

## 2017-06-17 DIAGNOSIS — D509 Iron deficiency anemia, unspecified: Secondary | ICD-10-CM | POA: Diagnosis not present

## 2017-06-17 DIAGNOSIS — E119 Type 2 diabetes mellitus without complications: Secondary | ICD-10-CM | POA: Diagnosis not present

## 2017-06-17 DIAGNOSIS — N2581 Secondary hyperparathyroidism of renal origin: Secondary | ICD-10-CM | POA: Diagnosis not present

## 2017-06-20 DIAGNOSIS — Z23 Encounter for immunization: Secondary | ICD-10-CM | POA: Diagnosis not present

## 2017-06-20 DIAGNOSIS — N186 End stage renal disease: Secondary | ICD-10-CM | POA: Diagnosis not present

## 2017-06-20 DIAGNOSIS — E119 Type 2 diabetes mellitus without complications: Secondary | ICD-10-CM | POA: Diagnosis not present

## 2017-06-20 DIAGNOSIS — D509 Iron deficiency anemia, unspecified: Secondary | ICD-10-CM | POA: Diagnosis not present

## 2017-06-20 DIAGNOSIS — N2581 Secondary hyperparathyroidism of renal origin: Secondary | ICD-10-CM | POA: Diagnosis not present

## 2017-06-22 DIAGNOSIS — N2581 Secondary hyperparathyroidism of renal origin: Secondary | ICD-10-CM | POA: Diagnosis not present

## 2017-06-22 DIAGNOSIS — D509 Iron deficiency anemia, unspecified: Secondary | ICD-10-CM | POA: Diagnosis not present

## 2017-06-22 DIAGNOSIS — N186 End stage renal disease: Secondary | ICD-10-CM | POA: Diagnosis not present

## 2017-06-22 DIAGNOSIS — E119 Type 2 diabetes mellitus without complications: Secondary | ICD-10-CM | POA: Diagnosis not present

## 2017-06-22 DIAGNOSIS — Z23 Encounter for immunization: Secondary | ICD-10-CM | POA: Diagnosis not present

## 2017-06-24 DIAGNOSIS — D509 Iron deficiency anemia, unspecified: Secondary | ICD-10-CM | POA: Diagnosis not present

## 2017-06-24 DIAGNOSIS — N2581 Secondary hyperparathyroidism of renal origin: Secondary | ICD-10-CM | POA: Diagnosis not present

## 2017-06-24 DIAGNOSIS — N186 End stage renal disease: Secondary | ICD-10-CM | POA: Diagnosis not present

## 2017-06-24 DIAGNOSIS — Z23 Encounter for immunization: Secondary | ICD-10-CM | POA: Diagnosis not present

## 2017-06-24 DIAGNOSIS — E119 Type 2 diabetes mellitus without complications: Secondary | ICD-10-CM | POA: Diagnosis not present

## 2017-06-27 DIAGNOSIS — Z23 Encounter for immunization: Secondary | ICD-10-CM | POA: Diagnosis not present

## 2017-06-27 DIAGNOSIS — D509 Iron deficiency anemia, unspecified: Secondary | ICD-10-CM | POA: Diagnosis not present

## 2017-06-27 DIAGNOSIS — N2581 Secondary hyperparathyroidism of renal origin: Secondary | ICD-10-CM | POA: Diagnosis not present

## 2017-06-27 DIAGNOSIS — N186 End stage renal disease: Secondary | ICD-10-CM | POA: Diagnosis not present

## 2017-06-27 DIAGNOSIS — E119 Type 2 diabetes mellitus without complications: Secondary | ICD-10-CM | POA: Diagnosis not present

## 2017-06-29 DIAGNOSIS — N186 End stage renal disease: Secondary | ICD-10-CM | POA: Diagnosis not present

## 2017-06-29 DIAGNOSIS — N2581 Secondary hyperparathyroidism of renal origin: Secondary | ICD-10-CM | POA: Diagnosis not present

## 2017-06-29 DIAGNOSIS — Z23 Encounter for immunization: Secondary | ICD-10-CM | POA: Diagnosis not present

## 2017-06-29 DIAGNOSIS — D509 Iron deficiency anemia, unspecified: Secondary | ICD-10-CM | POA: Diagnosis not present

## 2017-06-29 DIAGNOSIS — E119 Type 2 diabetes mellitus without complications: Secondary | ICD-10-CM | POA: Diagnosis not present

## 2017-07-01 DIAGNOSIS — E119 Type 2 diabetes mellitus without complications: Secondary | ICD-10-CM | POA: Diagnosis not present

## 2017-07-01 DIAGNOSIS — D509 Iron deficiency anemia, unspecified: Secondary | ICD-10-CM | POA: Diagnosis not present

## 2017-07-01 DIAGNOSIS — N186 End stage renal disease: Secondary | ICD-10-CM | POA: Diagnosis not present

## 2017-07-01 DIAGNOSIS — Z23 Encounter for immunization: Secondary | ICD-10-CM | POA: Diagnosis not present

## 2017-07-01 DIAGNOSIS — N2581 Secondary hyperparathyroidism of renal origin: Secondary | ICD-10-CM | POA: Diagnosis not present

## 2017-07-04 ENCOUNTER — Ambulatory Visit (HOSPITAL_COMMUNITY): Payer: Medicare Other | Admitting: Certified Registered"

## 2017-07-04 ENCOUNTER — Encounter (HOSPITAL_COMMUNITY): Payer: Self-pay

## 2017-07-04 ENCOUNTER — Encounter (HOSPITAL_COMMUNITY)
Admission: AD | Disposition: A | Discharge status: Home / Self Care | Payer: Self-pay | Source: Ambulatory Visit | Attending: Vascular Surgery

## 2017-07-04 ENCOUNTER — Ambulatory Visit (HOSPITAL_COMMUNITY)
Admission: AD | Admit: 2017-07-04 | Discharge: 2017-07-04 | Disposition: A | Discharge status: Home / Self Care | Payer: Medicare Other | Source: Ambulatory Visit | Attending: Vascular Surgery | Admitting: Vascular Surgery

## 2017-07-04 DIAGNOSIS — E1122 Type 2 diabetes mellitus with diabetic chronic kidney disease: Secondary | ICD-10-CM | POA: Insufficient documentation

## 2017-07-04 DIAGNOSIS — Z992 Dependence on renal dialysis: Secondary | ICD-10-CM | POA: Diagnosis not present

## 2017-07-04 DIAGNOSIS — I120 Hypertensive chronic kidney disease with stage 5 chronic kidney disease or end stage renal disease: Secondary | ICD-10-CM | POA: Diagnosis not present

## 2017-07-04 DIAGNOSIS — E119 Type 2 diabetes mellitus without complications: Secondary | ICD-10-CM | POA: Diagnosis not present

## 2017-07-04 DIAGNOSIS — N2581 Secondary hyperparathyroidism of renal origin: Secondary | ICD-10-CM | POA: Diagnosis not present

## 2017-07-04 DIAGNOSIS — E669 Obesity, unspecified: Secondary | ICD-10-CM | POA: Diagnosis not present

## 2017-07-04 DIAGNOSIS — D509 Iron deficiency anemia, unspecified: Secondary | ICD-10-CM | POA: Diagnosis not present

## 2017-07-04 DIAGNOSIS — I871 Compression of vein: Secondary | ICD-10-CM | POA: Diagnosis not present

## 2017-07-04 DIAGNOSIS — Z87891 Personal history of nicotine dependence: Secondary | ICD-10-CM | POA: Diagnosis not present

## 2017-07-04 DIAGNOSIS — X58XXXA Exposure to other specified factors, initial encounter: Secondary | ICD-10-CM | POA: Insufficient documentation

## 2017-07-04 DIAGNOSIS — T82868A Thrombosis of vascular prosthetic devices, implants and grafts, initial encounter: Secondary | ICD-10-CM | POA: Diagnosis not present

## 2017-07-04 DIAGNOSIS — N186 End stage renal disease: Secondary | ICD-10-CM | POA: Insufficient documentation

## 2017-07-04 DIAGNOSIS — Z23 Encounter for immunization: Secondary | ICD-10-CM | POA: Diagnosis not present

## 2017-07-04 HISTORY — PX: THROMBECTOMY W/ EMBOLECTOMY: SHX2507

## 2017-07-04 LAB — POCT I-STAT 4, (NA,K, GLUC, HGB,HCT)
Glucose, Bld: 174 mg/dL — ABNORMAL HIGH (ref 65–99)
HCT: 45 % (ref 39.0–52.0)
Hemoglobin: 15.3 g/dL (ref 13.0–17.0)
Potassium: 5 mmol/L (ref 3.5–5.1)
SODIUM: 134 mmol/L — AB (ref 135–145)

## 2017-07-04 LAB — GLUCOSE, CAPILLARY
GLUCOSE-CAPILLARY: 143 mg/dL — AB (ref 65–99)
Glucose-Capillary: 148 mg/dL — ABNORMAL HIGH (ref 65–99)

## 2017-07-04 SURGERY — THROMBECTOMY ARTERIOVENOUS FISTULA
Anesthesia: General | Site: Arm Lower | Laterality: Right

## 2017-07-04 MED ORDER — CARVEDILOL 3.125 MG PO TABS
ORAL_TABLET | ORAL | Status: AC
  Filled 2017-07-04: qty 2

## 2017-07-04 MED ORDER — OXYCODONE-ACETAMINOPHEN 5-325 MG PO TABS
1.0000 | ORAL_TABLET | Freq: Once | ORAL | Status: AC
  Administered 2017-07-04: 1 via ORAL

## 2017-07-04 MED ORDER — SODIUM CHLORIDE 0.9 % IV SOLN
INTRAVENOUS | Status: DC | PRN
  Administered 2017-07-04: 50 mL

## 2017-07-04 MED ORDER — MIDAZOLAM HCL 5 MG/5ML IJ SOLN
INTRAMUSCULAR | Status: DC | PRN
  Administered 2017-07-04: 1 mg via INTRAVENOUS

## 2017-07-04 MED ORDER — VANCOMYCIN HCL 1000 MG IV SOLR
INTRAVENOUS | Status: DC | PRN
  Administered 2017-07-04: 1000 mg via INTRAVENOUS

## 2017-07-04 MED ORDER — MIDAZOLAM HCL 2 MG/2ML IJ SOLN
INTRAMUSCULAR | Status: AC
  Filled 2017-07-04: qty 2

## 2017-07-04 MED ORDER — FENTANYL CITRATE (PF) 250 MCG/5ML IJ SOLN
INTRAMUSCULAR | Status: AC
  Filled 2017-07-04: qty 5

## 2017-07-04 MED ORDER — VANCOMYCIN HCL IN DEXTROSE 1-5 GM/200ML-% IV SOLN
INTRAVENOUS | Status: AC
  Filled 2017-07-04: qty 200

## 2017-07-04 MED ORDER — ONDANSETRON HCL 4 MG/2ML IJ SOLN
INTRAMUSCULAR | Status: AC
  Filled 2017-07-04: qty 2

## 2017-07-04 MED ORDER — OXYCODONE-ACETAMINOPHEN 5-325 MG PO TABS
ORAL_TABLET | ORAL | Status: AC
  Administered 2017-07-04: 1 via ORAL
  Filled 2017-07-04: qty 1

## 2017-07-04 MED ORDER — OXYCODONE HCL 5 MG/5ML PO SOLN: 5.0000 mg | Freq: Once | ORAL | Status: DC | PRN

## 2017-07-04 MED ORDER — LIDOCAINE HCL (CARDIAC) 20 MG/ML IV SOLN
INTRAVENOUS | Status: DC | PRN
  Administered 2017-07-04: 80 mg via INTRAVENOUS

## 2017-07-04 MED ORDER — SODIUM CHLORIDE 0.9 % IR SOLN
Status: DC | PRN
  Administered 2017-07-04: 1000 mL

## 2017-07-04 MED ORDER — CARVEDILOL 6.25 MG PO TABS
6.2500 mg | ORAL_TABLET | Freq: Once | ORAL | Status: AC
  Administered 2017-07-04: 6.25 mg via ORAL

## 2017-07-04 MED ORDER — EPHEDRINE SULFATE 50 MG/ML IJ SOLN
INTRAMUSCULAR | Status: DC | PRN
  Administered 2017-07-04: 15 mg via INTRAVENOUS
  Administered 2017-07-04 (×2): 10 mg via INTRAVENOUS

## 2017-07-04 MED ORDER — PHENYLEPHRINE HCL 10 MG/ML IJ SOLN
INTRAMUSCULAR | Status: DC | PRN
  Administered 2017-07-04: 120 ug via INTRAVENOUS
  Administered 2017-07-04 (×2): 80 ug via INTRAVENOUS

## 2017-07-04 MED ORDER — PHENYLEPHRINE HCL 10 MG/ML IJ SOLN
INTRAMUSCULAR | Status: DC | PRN
  Administered 2017-07-04: 20 ug/min via INTRAVENOUS

## 2017-07-04 MED ORDER — SODIUM CHLORIDE 0.9 % IV SOLN
Freq: Once | INTRAVENOUS | Status: AC
  Administered 2017-07-04: 10:00:00 via INTRAVENOUS

## 2017-07-04 MED ORDER — OXYCODONE-ACETAMINOPHEN 5-325 MG PO TABS: 1.0000 | ORAL_TABLET | Freq: Four times a day (QID) | ORAL | 0 refills | Status: DC | PRN

## 2017-07-04 MED ORDER — ONDANSETRON HCL 4 MG/2ML IJ SOLN
INTRAMUSCULAR | Status: DC | PRN
  Administered 2017-07-04: 4 mg via INTRAVENOUS

## 2017-07-04 MED ORDER — FENTANYL CITRATE (PF) 100 MCG/2ML IJ SOLN
INTRAMUSCULAR | Status: DC | PRN
  Administered 2017-07-04 (×4): 50 ug via INTRAVENOUS

## 2017-07-04 MED ORDER — FENTANYL CITRATE (PF) 100 MCG/2ML IJ SOLN: 25.0000 ug | INTRAMUSCULAR | Status: DC | PRN

## 2017-07-04 MED ORDER — PROPOFOL 10 MG/ML IV BOLUS
INTRAVENOUS | Status: DC | PRN
  Administered 2017-07-04: 200 mg via INTRAVENOUS

## 2017-07-04 MED ORDER — LIDOCAINE-EPINEPHRINE 0.5 %-1:200000 IJ SOLN
INTRAMUSCULAR | Status: AC
  Filled 2017-07-04: qty 1

## 2017-07-04 MED ORDER — DEXTROSE 5 % IV SOLN
INTRAVENOUS | Status: AC
  Filled 2017-07-04: qty 1.5

## 2017-07-04 MED ORDER — PROPOFOL 10 MG/ML IV BOLUS
INTRAVENOUS | Status: AC
  Filled 2017-07-04: qty 20

## 2017-07-04 MED ORDER — SODIUM CHLORIDE 0.9 % IV SOLN
INTRAVENOUS | Status: DC | PRN
  Administered 2017-07-04 (×2): via INTRAVENOUS

## 2017-07-04 MED ORDER — ONDANSETRON HCL 4 MG/2ML IJ SOLN: 4.0000 mg | Freq: Once | INTRAMUSCULAR | Status: DC | PRN

## 2017-07-04 MED ORDER — OXYCODONE HCL 5 MG PO TABS: 5.0000 mg | ORAL_TABLET | Freq: Once | ORAL | Status: DC | PRN

## 2017-07-04 SURGICAL SUPPLY — 30 items
ARMBAND PINK RESTRICT EXTREMIT (MISCELLANEOUS) ×2 IMPLANT
CANISTER SUCT 3000ML PPV (MISCELLANEOUS) ×2 IMPLANT
CANNULA VESSEL 3MM 2 BLNT TIP (CANNULA) ×2 IMPLANT
CATH EMB 4FR 80CM (CATHETERS) ×2 IMPLANT
CLIP LIGATING EXTRA MED SLVR (CLIP) ×2 IMPLANT
CLIP LIGATING EXTRA SM BLUE (MISCELLANEOUS) ×2 IMPLANT
DECANTER SPIKE VIAL GLASS SM (MISCELLANEOUS) IMPLANT
DERMABOND ADVANCED (GAUZE/BANDAGES/DRESSINGS) ×1
DERMABOND ADVANCED .7 DNX12 (GAUZE/BANDAGES/DRESSINGS) ×1 IMPLANT
ELECT REM PT RETURN 9FT ADLT (ELECTROSURGICAL) ×2
ELECTRODE REM PT RTRN 9FT ADLT (ELECTROSURGICAL) ×1 IMPLANT
GLOVE BIO SURGEON STRL SZ 6.5 (GLOVE) ×2 IMPLANT
GLOVE BIO SURGEON STRL SZ7 (GLOVE) ×6 IMPLANT
GLOVE BIOGEL PI IND STRL 7.5 (GLOVE) ×1 IMPLANT
GLOVE BIOGEL PI INDICATOR 7.5 (GLOVE) ×1
GLOVE ECLIPSE 7.0 STRL STRAW (GLOVE) ×2 IMPLANT
GLOVE SS BIOGEL STRL SZ 7.5 (GLOVE) ×1 IMPLANT
GLOVE SUPERSENSE BIOGEL SZ 7.5 (GLOVE) ×1
GOWN STRL REUS W/ TWL LRG LVL3 (GOWN DISPOSABLE) ×3 IMPLANT
GOWN STRL REUS W/TWL LRG LVL3 (GOWN DISPOSABLE) ×3
KIT BASIN OR (CUSTOM PROCEDURE TRAY) ×2 IMPLANT
KIT ROOM TURNOVER OR (KITS) ×2 IMPLANT
NS IRRIG 1000ML POUR BTL (IV SOLUTION) ×2 IMPLANT
PACK CV ACCESS (CUSTOM PROCEDURE TRAY) ×2 IMPLANT
PAD ARMBOARD 7.5X6 YLW CONV (MISCELLANEOUS) ×4 IMPLANT
SUT PROLENE 6 0 CC (SUTURE) ×4 IMPLANT
SUT VIC AB 3-0 SH 27 (SUTURE) ×1
SUT VIC AB 3-0 SH 27X BRD (SUTURE) ×1 IMPLANT
UNDERPAD 30X30 (UNDERPADS AND DIAPERS) ×2 IMPLANT
WATER STERILE IRR 1000ML POUR (IV SOLUTION) ×2 IMPLANT

## 2017-07-04 NOTE — Discharge Instructions (Signed)
Be at your Dialysis center at 11:25 tomorrow 07/05/2017.

## 2017-07-04 NOTE — H&P (Signed)
   Patient name: [name] MRN: [id number] DOB: 1956-05-25 Sex: male    HPI: [name] is a 61 y.o. male presents today for thrombectomy and possible revision of his right arm AV fistula. He went to [facility] today for lysis and had incomplete lysis. Dr. Augustin Coupe has requested that we surgically revise his access.  Current Facility-Administered Medications  Medication Dose Route Frequency Provider Last Rate Last Dose  . carvedilol (COREG) 3.125 MG tablet              PHYSICAL EXAM: Vitals:   07/04/17 0919  BP: 140/86  Pulse: 79  Resp: 16  Temp: 97.9 F (36.6 C)  TempSrc: Oral  SpO2: 100%    GENERAL: The patient is a well-nourished male, in no acute distress. The vital signs are documented above. Patient does have a palpable thrill but there is thickening in his fistula.  MEDICAL ISSUES: Incomplete lysis of right arm fistula. Will take to the operating room for thrombectomy and possible revision   Rosetta Posner, MD [facility] Tel [phone] Pager [phone]

## 2017-07-04 NOTE — Op Note (Signed)
    OPERATIVE REPORT  DATE OF SURGERY: 07/04/2017  PATIENT: LENUS TRAUGER, 61 y.o. male MRN: [id number]  DOB: 1956/08/22  PRE-OPERATIVE DIAGNOSIS: End-stage renal disease with partially thrombosed right arm AV fistula  POST-OPERATIVE DIAGNOSIS:  Same  PROCEDURE: Exploration and removal of chronic thrombus near the arterial anastomosis  SURGEON:  Curt Jews, M.D.  PHYSICIAN ASSISTANT: Gerri Lins PA-C  ANESTHESIA:  Gen.  EBL: 50 ml  Total I/O In: 500 [I.V.:500] Out: 50 [Blood:50]  BLOOD ADMINISTERED: None  DRAINS: None  SPECIMEN: None  COUNTS CORRECT:  YES  PLAN OF CARE: PACU   PATIENT DISPOSITION:  PACU - hemodynamically stable  PROCEDURE DETAILS: The patient was seen earlier at [facility] where he had lysis. Dr. Augustin Coupe felt the patient had a coherent thrombus and was sent for surgical thrombectomy. The right arm was prepped and draped in usual sterile fashion. Initially incision was made near the wrist over the cephalic vein. The cephalic vein fistula was encircled and was occluded proximally and distally and was opened transversely. Fogarty passed easily through the arterial anastomosis with no evidence of stenosis. This was flushed with heparinized and reoccluded. 30 pack could not pass centrally having hanging up to just above this prior incision and area of prior revision. This incision was made over this area to expose the vein at this area. The vein was occluded proximally and distally was opened longitudinally through the prior longitudinal incision. There was some adherent chronic thrombus and a 5 day for 5 mm dilator then passed through this area without resistance. The Fogarty could pass centrally without resistance and no further thrombus was removed. The wounds were irrigated with saline and the vein was flushed with heparinized saline. The incisions and the vein were closed with running 6-0 Prolene suture. Clamps removed and excellent  thrill was noted. The wound irrigated with saline. Hemostasis tablet cautery. Wounds were closed with 3-0 Vicryl in the subcutaneous and subcuticular tissue. Sterile dressing was applied the patient was transferred to the recovery room   Rosetta Posner, M.D., [facility] 07/04/2017 2:37 PM

## 2017-07-04 NOTE — Transfer of Care (Signed)
Immediate Anesthesia Transfer of Care Note  Patient: [name]  Procedure(s) Performed: Procedure(s): EXPLORATION AND THROMBECTOMY ARTERIOVENOUS FISTULA RIGHT ARM (Right)  Patient Location: PACU  Anesthesia Type:General  Level of Consciousness: awake and patient cooperative  Airway & Oxygen Therapy: Patient Spontanous Breathing  Post-op Assessment: Report given to RN and Post -op Vital signs reviewed and stable  Post vital signs: Reviewed and stable  Last Vitals:  Vitals:   07/04/17 0919 07/04/17 1442  BP: 140/86   Pulse: 79   Resp: 16   Temp: 36.6 C (P) 36.4 C  SpO2: 100%     Last Pain:  Vitals:   07/04/17 1442  TempSrc:   PainSc: (P) 0-No pain      Patients Stated Pain Goal: 2 (04/75/33 9179)  Complications: No apparent anesthesia complications

## 2017-07-04 NOTE — Anesthesia Preprocedure Evaluation (Addendum)
Anesthesia Evaluation  Patient identified by MRN, date of birth, ID band Patient awake    Reviewed: Allergy & Precautions, NPO status , Patient's Chart, lab work & pertinent test results  Airway Mallampati: II  TM Distance: >3 FB Neck ROM: Full    Dental no notable dental hx. (+) Teeth Intact, Dental Advisory Given,    Pulmonary former smoker,    Pulmonary exam normal breath sounds clear to auscultation       Cardiovascular hypertension, Normal cardiovascular exam Rhythm:Regular Rate:Normal     Neuro/Psych    GI/Hepatic   Endo/Other  diabetes  Renal/GU      Musculoskeletal   Abdominal (+) + obese,   Peds  Hematology   Anesthesia Other Findings   Reproductive/Obstetrics                            Anesthesia Physical Anesthesia Plan  ASA: III  Anesthesia Plan: General   Post-op Pain Management:    Induction: Intravenous  PONV Risk Score and Plan: Ondansetron  Airway Management Planned: Oral ETT  Additional Equipment:   Intra-op Plan:   Post-operative Plan: Extubation in OR  Informed Consent: I have reviewed the patients History and Physical, chart, labs and discussed the procedure including the risks, benefits and alternatives for the proposed anesthesia with the patient or authorized representative who has indicated his/her understanding and acceptance.   Dental advisory given  Plan Discussed with: CRNA and Anesthesiologist  Anesthesia Plan Comments:         Anesthesia Quick Evaluation

## 2017-07-04 NOTE — Anesthesia Postprocedure Evaluation (Signed)
Anesthesia Post Note  Patient: CLARK CUFF  Procedure(s) Performed: Procedure(s) (LRB): EXPLORATION AND THROMBECTOMY ARTERIOVENOUS FISTULA RIGHT ARM (Right)     Patient location during evaluation: PACU Anesthesia Type: General Level of consciousness: awake and alert Pain management: pain level controlled Vital Signs Assessment: post-procedure vital signs reviewed and stable Respiratory status: spontaneous breathing, nonlabored ventilation and respiratory function stable Cardiovascular status: blood pressure returned to baseline and stable Postop Assessment: no signs of nausea or vomiting Anesthetic complications: no    Last Vitals:  Vitals:   07/04/17 1457 07/04/17 1515  BP: (!) 97/30 (!) 129/92  Pulse: 68 71  Resp: (!) 22 20  Temp:    SpO2: 95% 99%    Last Pain:  Vitals:   07/04/17 1515  TempSrc:   PainSc: 0-No pain                 Lynda Rainwater

## 2017-07-04 NOTE — Anesthesia Procedure Notes (Signed)
Procedure Name: LMA Insertion Date/Time: 07/04/2017 1:13 PM Performed by: Lance Coon Pre-anesthesia Checklist: Patient identified, Emergency Drugs available, Suction available, Patient being monitored and Timeout performed Patient Re-evaluated:Patient Re-evaluated prior to induction Oxygen Delivery Method: Circle system utilized Preoxygenation: Pre-oxygenation with 100% oxygen Induction Type: IV induction LMA: LMA inserted LMA Size: 5.0 Number of attempts: 1 Placement Confirmation: positive ETCO2 and breath sounds checked- equal and bilateral Tube secured with: Tape Dental Injury: Teeth and Oropharynx as per pre-operative assessment

## 2017-07-05 ENCOUNTER — Encounter (HOSPITAL_COMMUNITY): Payer: Self-pay | Admitting: Vascular Surgery

## 2017-07-06 DIAGNOSIS — D509 Iron deficiency anemia, unspecified: Secondary | ICD-10-CM | POA: Diagnosis not present

## 2017-07-06 DIAGNOSIS — Z23 Encounter for immunization: Secondary | ICD-10-CM | POA: Diagnosis not present

## 2017-07-06 DIAGNOSIS — E119 Type 2 diabetes mellitus without complications: Secondary | ICD-10-CM | POA: Diagnosis not present

## 2017-07-06 DIAGNOSIS — N2581 Secondary hyperparathyroidism of renal origin: Secondary | ICD-10-CM | POA: Diagnosis not present

## 2017-07-06 DIAGNOSIS — N186 End stage renal disease: Secondary | ICD-10-CM | POA: Diagnosis not present

## 2017-07-08 DIAGNOSIS — N186 End stage renal disease: Secondary | ICD-10-CM | POA: Diagnosis not present

## 2017-07-08 DIAGNOSIS — D509 Iron deficiency anemia, unspecified: Secondary | ICD-10-CM | POA: Diagnosis not present

## 2017-07-08 DIAGNOSIS — E119 Type 2 diabetes mellitus without complications: Secondary | ICD-10-CM | POA: Diagnosis not present

## 2017-07-08 DIAGNOSIS — N2581 Secondary hyperparathyroidism of renal origin: Secondary | ICD-10-CM | POA: Diagnosis not present

## 2017-07-08 DIAGNOSIS — Z23 Encounter for immunization: Secondary | ICD-10-CM | POA: Diagnosis not present

## 2017-07-11 DIAGNOSIS — D509 Iron deficiency anemia, unspecified: Secondary | ICD-10-CM | POA: Diagnosis not present

## 2017-07-11 DIAGNOSIS — Z23 Encounter for immunization: Secondary | ICD-10-CM | POA: Diagnosis not present

## 2017-07-11 DIAGNOSIS — N186 End stage renal disease: Secondary | ICD-10-CM | POA: Diagnosis not present

## 2017-07-11 DIAGNOSIS — E119 Type 2 diabetes mellitus without complications: Secondary | ICD-10-CM | POA: Diagnosis not present

## 2017-07-11 DIAGNOSIS — N2581 Secondary hyperparathyroidism of renal origin: Secondary | ICD-10-CM | POA: Diagnosis not present

## 2017-07-13 DIAGNOSIS — Z23 Encounter for immunization: Secondary | ICD-10-CM | POA: Diagnosis not present

## 2017-07-13 DIAGNOSIS — N2581 Secondary hyperparathyroidism of renal origin: Secondary | ICD-10-CM | POA: Diagnosis not present

## 2017-07-13 DIAGNOSIS — D509 Iron deficiency anemia, unspecified: Secondary | ICD-10-CM | POA: Diagnosis not present

## 2017-07-13 DIAGNOSIS — N186 End stage renal disease: Secondary | ICD-10-CM | POA: Diagnosis not present

## 2017-07-13 DIAGNOSIS — E119 Type 2 diabetes mellitus without complications: Secondary | ICD-10-CM | POA: Diagnosis not present

## 2017-07-15 DIAGNOSIS — E1129 Type 2 diabetes mellitus with other diabetic kidney complication: Secondary | ICD-10-CM | POA: Diagnosis not present

## 2017-07-15 DIAGNOSIS — N186 End stage renal disease: Secondary | ICD-10-CM | POA: Diagnosis not present

## 2017-07-15 DIAGNOSIS — D509 Iron deficiency anemia, unspecified: Secondary | ICD-10-CM | POA: Diagnosis not present

## 2017-07-15 DIAGNOSIS — Z992 Dependence on renal dialysis: Secondary | ICD-10-CM | POA: Diagnosis not present

## 2017-07-15 DIAGNOSIS — N2581 Secondary hyperparathyroidism of renal origin: Secondary | ICD-10-CM | POA: Diagnosis not present

## 2017-07-15 DIAGNOSIS — Z23 Encounter for immunization: Secondary | ICD-10-CM | POA: Diagnosis not present

## 2017-07-15 DIAGNOSIS — E119 Type 2 diabetes mellitus without complications: Secondary | ICD-10-CM | POA: Diagnosis not present

## 2017-07-18 DIAGNOSIS — N2581 Secondary hyperparathyroidism of renal origin: Secondary | ICD-10-CM | POA: Diagnosis not present

## 2017-07-18 DIAGNOSIS — E119 Type 2 diabetes mellitus without complications: Secondary | ICD-10-CM | POA: Diagnosis not present

## 2017-07-18 DIAGNOSIS — N186 End stage renal disease: Secondary | ICD-10-CM | POA: Diagnosis not present

## 2017-07-20 DIAGNOSIS — G4733 Obstructive sleep apnea (adult) (pediatric): Secondary | ICD-10-CM | POA: Diagnosis not present

## 2017-07-20 DIAGNOSIS — N186 End stage renal disease: Secondary | ICD-10-CM | POA: Diagnosis not present

## 2017-07-20 DIAGNOSIS — N2581 Secondary hyperparathyroidism of renal origin: Secondary | ICD-10-CM | POA: Diagnosis not present

## 2017-07-20 DIAGNOSIS — E119 Type 2 diabetes mellitus without complications: Secondary | ICD-10-CM | POA: Diagnosis not present

## 2017-07-22 DIAGNOSIS — E119 Type 2 diabetes mellitus without complications: Secondary | ICD-10-CM | POA: Diagnosis not present

## 2017-07-22 DIAGNOSIS — N2581 Secondary hyperparathyroidism of renal origin: Secondary | ICD-10-CM | POA: Diagnosis not present

## 2017-07-22 DIAGNOSIS — N186 End stage renal disease: Secondary | ICD-10-CM | POA: Diagnosis not present

## 2017-07-25 DIAGNOSIS — N2581 Secondary hyperparathyroidism of renal origin: Secondary | ICD-10-CM | POA: Diagnosis not present

## 2017-07-25 DIAGNOSIS — E119 Type 2 diabetes mellitus without complications: Secondary | ICD-10-CM | POA: Diagnosis not present

## 2017-07-25 DIAGNOSIS — N186 End stage renal disease: Secondary | ICD-10-CM | POA: Diagnosis not present

## 2017-07-27 DIAGNOSIS — N186 End stage renal disease: Secondary | ICD-10-CM | POA: Diagnosis not present

## 2017-07-27 DIAGNOSIS — E119 Type 2 diabetes mellitus without complications: Secondary | ICD-10-CM | POA: Diagnosis not present

## 2017-07-27 DIAGNOSIS — N2581 Secondary hyperparathyroidism of renal origin: Secondary | ICD-10-CM | POA: Diagnosis not present

## 2017-07-29 DIAGNOSIS — E119 Type 2 diabetes mellitus without complications: Secondary | ICD-10-CM | POA: Diagnosis not present

## 2017-07-29 DIAGNOSIS — N2581 Secondary hyperparathyroidism of renal origin: Secondary | ICD-10-CM | POA: Diagnosis not present

## 2017-07-29 DIAGNOSIS — N186 End stage renal disease: Secondary | ICD-10-CM | POA: Diagnosis not present

## 2017-08-01 DIAGNOSIS — N186 End stage renal disease: Secondary | ICD-10-CM | POA: Diagnosis not present

## 2017-08-01 DIAGNOSIS — N2581 Secondary hyperparathyroidism of renal origin: Secondary | ICD-10-CM | POA: Diagnosis not present

## 2017-08-01 DIAGNOSIS — E119 Type 2 diabetes mellitus without complications: Secondary | ICD-10-CM | POA: Diagnosis not present

## 2017-08-03 DIAGNOSIS — N186 End stage renal disease: Secondary | ICD-10-CM | POA: Diagnosis not present

## 2017-08-03 DIAGNOSIS — E119 Type 2 diabetes mellitus without complications: Secondary | ICD-10-CM | POA: Diagnosis not present

## 2017-08-03 DIAGNOSIS — N2581 Secondary hyperparathyroidism of renal origin: Secondary | ICD-10-CM | POA: Diagnosis not present

## 2017-08-05 DIAGNOSIS — E119 Type 2 diabetes mellitus without complications: Secondary | ICD-10-CM | POA: Diagnosis not present

## 2017-08-05 DIAGNOSIS — N2581 Secondary hyperparathyroidism of renal origin: Secondary | ICD-10-CM | POA: Diagnosis not present

## 2017-08-05 DIAGNOSIS — N186 End stage renal disease: Secondary | ICD-10-CM | POA: Diagnosis not present

## 2017-08-08 DIAGNOSIS — E119 Type 2 diabetes mellitus without complications: Secondary | ICD-10-CM | POA: Diagnosis not present

## 2017-08-08 DIAGNOSIS — N186 End stage renal disease: Secondary | ICD-10-CM | POA: Diagnosis not present

## 2017-08-08 DIAGNOSIS — N2581 Secondary hyperparathyroidism of renal origin: Secondary | ICD-10-CM | POA: Diagnosis not present

## 2017-08-09 DIAGNOSIS — H40013 Open angle with borderline findings, low risk, bilateral: Secondary | ICD-10-CM | POA: Diagnosis not present

## 2017-08-09 DIAGNOSIS — E113293 Type 2 diabetes mellitus with mild nonproliferative diabetic retinopathy without macular edema, bilateral: Secondary | ICD-10-CM | POA: Diagnosis not present

## 2017-08-09 DIAGNOSIS — Z961 Presence of intraocular lens: Secondary | ICD-10-CM | POA: Diagnosis not present

## 2017-08-10 DIAGNOSIS — E119 Type 2 diabetes mellitus without complications: Secondary | ICD-10-CM | POA: Diagnosis not present

## 2017-08-10 DIAGNOSIS — N186 End stage renal disease: Secondary | ICD-10-CM | POA: Diagnosis not present

## 2017-08-10 DIAGNOSIS — N2581 Secondary hyperparathyroidism of renal origin: Secondary | ICD-10-CM | POA: Diagnosis not present

## 2017-08-12 DIAGNOSIS — E119 Type 2 diabetes mellitus without complications: Secondary | ICD-10-CM | POA: Diagnosis not present

## 2017-08-12 DIAGNOSIS — N2581 Secondary hyperparathyroidism of renal origin: Secondary | ICD-10-CM | POA: Diagnosis not present

## 2017-08-12 DIAGNOSIS — N186 End stage renal disease: Secondary | ICD-10-CM | POA: Diagnosis not present

## 2017-08-14 DIAGNOSIS — Z992 Dependence on renal dialysis: Secondary | ICD-10-CM | POA: Diagnosis not present

## 2017-08-14 DIAGNOSIS — E1129 Type 2 diabetes mellitus with other diabetic kidney complication: Secondary | ICD-10-CM | POA: Diagnosis not present

## 2017-08-14 DIAGNOSIS — N186 End stage renal disease: Secondary | ICD-10-CM | POA: Diagnosis not present

## 2017-08-15 DIAGNOSIS — D509 Iron deficiency anemia, unspecified: Secondary | ICD-10-CM | POA: Diagnosis not present

## 2017-08-15 DIAGNOSIS — N186 End stage renal disease: Secondary | ICD-10-CM | POA: Diagnosis not present

## 2017-08-15 DIAGNOSIS — N2581 Secondary hyperparathyroidism of renal origin: Secondary | ICD-10-CM | POA: Diagnosis not present

## 2017-08-15 DIAGNOSIS — E119 Type 2 diabetes mellitus without complications: Secondary | ICD-10-CM | POA: Diagnosis not present

## 2017-08-17 DIAGNOSIS — N2581 Secondary hyperparathyroidism of renal origin: Secondary | ICD-10-CM | POA: Diagnosis not present

## 2017-08-17 DIAGNOSIS — N186 End stage renal disease: Secondary | ICD-10-CM | POA: Diagnosis not present

## 2017-08-17 DIAGNOSIS — D509 Iron deficiency anemia, unspecified: Secondary | ICD-10-CM | POA: Diagnosis not present

## 2017-08-17 DIAGNOSIS — E119 Type 2 diabetes mellitus without complications: Secondary | ICD-10-CM | POA: Diagnosis not present

## 2017-08-18 DIAGNOSIS — E782 Mixed hyperlipidemia: Secondary | ICD-10-CM | POA: Diagnosis not present

## 2017-08-18 DIAGNOSIS — D472 Monoclonal gammopathy: Secondary | ICD-10-CM | POA: Diagnosis not present

## 2017-08-18 DIAGNOSIS — M109 Gout, unspecified: Secondary | ICD-10-CM | POA: Diagnosis not present

## 2017-08-18 DIAGNOSIS — I10 Essential (primary) hypertension: Secondary | ICD-10-CM | POA: Diagnosis not present

## 2017-08-18 DIAGNOSIS — E1122 Type 2 diabetes mellitus with diabetic chronic kidney disease: Secondary | ICD-10-CM | POA: Diagnosis not present

## 2017-08-18 DIAGNOSIS — G4730 Sleep apnea, unspecified: Secondary | ICD-10-CM | POA: Diagnosis not present

## 2017-08-18 DIAGNOSIS — I509 Heart failure, unspecified: Secondary | ICD-10-CM | POA: Diagnosis not present

## 2017-08-18 DIAGNOSIS — Z6841 Body Mass Index (BMI) 40.0 and over, adult: Secondary | ICD-10-CM | POA: Diagnosis not present

## 2017-08-18 DIAGNOSIS — N186 End stage renal disease: Secondary | ICD-10-CM | POA: Diagnosis not present

## 2017-08-18 DIAGNOSIS — E0842 Diabetes mellitus due to underlying condition with diabetic polyneuropathy: Secondary | ICD-10-CM | POA: Diagnosis not present

## 2017-08-18 DIAGNOSIS — R600 Localized edema: Secondary | ICD-10-CM | POA: Diagnosis not present

## 2017-08-19 DIAGNOSIS — E119 Type 2 diabetes mellitus without complications: Secondary | ICD-10-CM | POA: Diagnosis not present

## 2017-08-19 DIAGNOSIS — D509 Iron deficiency anemia, unspecified: Secondary | ICD-10-CM | POA: Diagnosis not present

## 2017-08-19 DIAGNOSIS — N2581 Secondary hyperparathyroidism of renal origin: Secondary | ICD-10-CM | POA: Diagnosis not present

## 2017-08-19 DIAGNOSIS — N186 End stage renal disease: Secondary | ICD-10-CM | POA: Diagnosis not present

## 2017-08-22 DIAGNOSIS — N2581 Secondary hyperparathyroidism of renal origin: Secondary | ICD-10-CM | POA: Diagnosis not present

## 2017-08-22 DIAGNOSIS — E119 Type 2 diabetes mellitus without complications: Secondary | ICD-10-CM | POA: Diagnosis not present

## 2017-08-22 DIAGNOSIS — N186 End stage renal disease: Secondary | ICD-10-CM | POA: Diagnosis not present

## 2017-08-22 DIAGNOSIS — D509 Iron deficiency anemia, unspecified: Secondary | ICD-10-CM | POA: Diagnosis not present

## 2017-08-22 DIAGNOSIS — T82868A Thrombosis of vascular prosthetic devices, implants and grafts, initial encounter: Secondary | ICD-10-CM | POA: Diagnosis not present

## 2017-08-22 DIAGNOSIS — I871 Compression of vein: Secondary | ICD-10-CM | POA: Diagnosis not present

## 2017-08-22 DIAGNOSIS — Z992 Dependence on renal dialysis: Secondary | ICD-10-CM | POA: Diagnosis not present

## 2017-08-23 ENCOUNTER — Ambulatory Visit: Payer: Medicare Other | Admitting: Podiatry

## 2017-08-23 DIAGNOSIS — B351 Tinea unguium: Secondary | ICD-10-CM

## 2017-08-23 DIAGNOSIS — M79675 Pain in left toe(s): Secondary | ICD-10-CM

## 2017-08-23 DIAGNOSIS — E1142 Type 2 diabetes mellitus with diabetic polyneuropathy: Secondary | ICD-10-CM

## 2017-08-23 DIAGNOSIS — M79674 Pain in right toe(s): Secondary | ICD-10-CM

## 2017-08-23 NOTE — Progress Notes (Signed)
Patient ID: [name], male   DOB: [birth year], 61 y.o.   MRN: [id number]    Subjective: This patient presents for scheduled visit complaining of uncomfortable toenails walking wearing shoes and requests toenail debridement.He has received his diabetic shoes and is wearing without any complaints  Objective:  Orientated 3  Vascular: Mild lower extremity edema with mild calf tenderness left Mild lower extremity edema right DP pulses 1/4 bilaterally PT pulses 1/4 bilaterally Capillary reflex within normal limits  Neurological: Sensation to 10 g monofilament wire intact 2/5 bilaterally Vibratory sensation nonreactive bilaterally Ankle reflexes equal and reactive bilaterally  Dermatological: No open skin lesions bilaterally Atrophic, shiny skin with absent hair growth bilaterally The toenails are hypertrophic, elongated, discolored, deformed and tender to direct palpation 6-10  Musculoskeletal: There is no restriction ankle, subtalar, midtarsal joints bilaterally Manual motor testing dorsi flexion, plantar flexion, inversion, eversion 5/5 bilaterally  Assessment: Peripheral arterial disease associated with decreased pulses Diabetic peripheral neuropathy associated with decreased sensation Edema left lower leg has been evaluated and a DVT has been ruled out,per patient Mycotic toenails 6-10  Plan: Debridement of toenails 6-10 mechanically and electrically with slight bleeding distal second right toe treated with topical antibiotic ointment and Band-Aid. Patient instructed removed Band-Aid 1-3 days and continue apply topical antibiotic ointment and Band-Aid daily until a scab forms  Reappoint 3 months

## 2017-08-23 NOTE — Patient Instructions (Signed)

## 2017-08-24 DIAGNOSIS — N2581 Secondary hyperparathyroidism of renal origin: Secondary | ICD-10-CM | POA: Diagnosis not present

## 2017-08-24 DIAGNOSIS — E119 Type 2 diabetes mellitus without complications: Secondary | ICD-10-CM | POA: Diagnosis not present

## 2017-08-24 DIAGNOSIS — N186 End stage renal disease: Secondary | ICD-10-CM | POA: Diagnosis not present

## 2017-08-24 DIAGNOSIS — D509 Iron deficiency anemia, unspecified: Secondary | ICD-10-CM | POA: Diagnosis not present

## 2017-08-26 DIAGNOSIS — N2581 Secondary hyperparathyroidism of renal origin: Secondary | ICD-10-CM | POA: Diagnosis not present

## 2017-08-26 DIAGNOSIS — E119 Type 2 diabetes mellitus without complications: Secondary | ICD-10-CM | POA: Diagnosis not present

## 2017-08-26 DIAGNOSIS — N186 End stage renal disease: Secondary | ICD-10-CM | POA: Diagnosis not present

## 2017-08-26 DIAGNOSIS — D509 Iron deficiency anemia, unspecified: Secondary | ICD-10-CM | POA: Diagnosis not present

## 2017-08-29 DIAGNOSIS — E119 Type 2 diabetes mellitus without complications: Secondary | ICD-10-CM | POA: Diagnosis not present

## 2017-08-29 DIAGNOSIS — N2581 Secondary hyperparathyroidism of renal origin: Secondary | ICD-10-CM | POA: Diagnosis not present

## 2017-08-29 DIAGNOSIS — D509 Iron deficiency anemia, unspecified: Secondary | ICD-10-CM | POA: Diagnosis not present

## 2017-08-29 DIAGNOSIS — N186 End stage renal disease: Secondary | ICD-10-CM | POA: Diagnosis not present

## 2017-08-31 DIAGNOSIS — D509 Iron deficiency anemia, unspecified: Secondary | ICD-10-CM | POA: Diagnosis not present

## 2017-08-31 DIAGNOSIS — N186 End stage renal disease: Secondary | ICD-10-CM | POA: Diagnosis not present

## 2017-08-31 DIAGNOSIS — E119 Type 2 diabetes mellitus without complications: Secondary | ICD-10-CM | POA: Diagnosis not present

## 2017-08-31 DIAGNOSIS — N2581 Secondary hyperparathyroidism of renal origin: Secondary | ICD-10-CM | POA: Diagnosis not present

## 2017-09-02 DIAGNOSIS — N186 End stage renal disease: Secondary | ICD-10-CM | POA: Diagnosis not present

## 2017-09-02 DIAGNOSIS — E119 Type 2 diabetes mellitus without complications: Secondary | ICD-10-CM | POA: Diagnosis not present

## 2017-09-02 DIAGNOSIS — D509 Iron deficiency anemia, unspecified: Secondary | ICD-10-CM | POA: Diagnosis not present

## 2017-09-02 DIAGNOSIS — N2581 Secondary hyperparathyroidism of renal origin: Secondary | ICD-10-CM | POA: Diagnosis not present

## 2017-09-05 DIAGNOSIS — E119 Type 2 diabetes mellitus without complications: Secondary | ICD-10-CM | POA: Diagnosis not present

## 2017-09-05 DIAGNOSIS — D509 Iron deficiency anemia, unspecified: Secondary | ICD-10-CM | POA: Diagnosis not present

## 2017-09-05 DIAGNOSIS — N186 End stage renal disease: Secondary | ICD-10-CM | POA: Diagnosis not present

## 2017-09-05 DIAGNOSIS — N2581 Secondary hyperparathyroidism of renal origin: Secondary | ICD-10-CM | POA: Diagnosis not present

## 2017-09-07 DIAGNOSIS — D509 Iron deficiency anemia, unspecified: Secondary | ICD-10-CM | POA: Diagnosis not present

## 2017-09-07 DIAGNOSIS — N2581 Secondary hyperparathyroidism of renal origin: Secondary | ICD-10-CM | POA: Diagnosis not present

## 2017-09-07 DIAGNOSIS — N186 End stage renal disease: Secondary | ICD-10-CM | POA: Diagnosis not present

## 2017-09-07 DIAGNOSIS — E119 Type 2 diabetes mellitus without complications: Secondary | ICD-10-CM | POA: Diagnosis not present

## 2017-09-09 ENCOUNTER — Ambulatory Visit (INDEPENDENT_AMBULATORY_CARE_PROVIDER_SITE_OTHER): Payer: Medicare Other | Admitting: Orthopaedic Surgery

## 2017-09-09 DIAGNOSIS — N186 End stage renal disease: Secondary | ICD-10-CM | POA: Diagnosis not present

## 2017-09-09 DIAGNOSIS — N2581 Secondary hyperparathyroidism of renal origin: Secondary | ICD-10-CM | POA: Diagnosis not present

## 2017-09-09 DIAGNOSIS — D509 Iron deficiency anemia, unspecified: Secondary | ICD-10-CM | POA: Diagnosis not present

## 2017-09-09 DIAGNOSIS — E119 Type 2 diabetes mellitus without complications: Secondary | ICD-10-CM | POA: Diagnosis not present

## 2017-09-12 DIAGNOSIS — E119 Type 2 diabetes mellitus without complications: Secondary | ICD-10-CM | POA: Diagnosis not present

## 2017-09-12 DIAGNOSIS — D509 Iron deficiency anemia, unspecified: Secondary | ICD-10-CM | POA: Diagnosis not present

## 2017-09-12 DIAGNOSIS — N2581 Secondary hyperparathyroidism of renal origin: Secondary | ICD-10-CM | POA: Diagnosis not present

## 2017-09-12 DIAGNOSIS — N186 End stage renal disease: Secondary | ICD-10-CM | POA: Diagnosis not present

## 2017-09-14 DIAGNOSIS — E1129 Type 2 diabetes mellitus with other diabetic kidney complication: Secondary | ICD-10-CM | POA: Diagnosis not present

## 2017-09-14 DIAGNOSIS — E119 Type 2 diabetes mellitus without complications: Secondary | ICD-10-CM | POA: Diagnosis not present

## 2017-09-14 DIAGNOSIS — Z992 Dependence on renal dialysis: Secondary | ICD-10-CM | POA: Diagnosis not present

## 2017-09-14 DIAGNOSIS — N186 End stage renal disease: Secondary | ICD-10-CM | POA: Diagnosis not present

## 2017-09-14 DIAGNOSIS — N2581 Secondary hyperparathyroidism of renal origin: Secondary | ICD-10-CM | POA: Diagnosis not present

## 2017-09-14 DIAGNOSIS — D509 Iron deficiency anemia, unspecified: Secondary | ICD-10-CM | POA: Diagnosis not present

## 2017-09-16 DIAGNOSIS — N186 End stage renal disease: Secondary | ICD-10-CM | POA: Diagnosis not present

## 2017-09-16 DIAGNOSIS — N2581 Secondary hyperparathyroidism of renal origin: Secondary | ICD-10-CM | POA: Diagnosis not present

## 2017-09-16 DIAGNOSIS — D509 Iron deficiency anemia, unspecified: Secondary | ICD-10-CM | POA: Diagnosis not present

## 2017-09-16 DIAGNOSIS — E119 Type 2 diabetes mellitus without complications: Secondary | ICD-10-CM | POA: Diagnosis not present

## 2017-09-19 DIAGNOSIS — N186 End stage renal disease: Secondary | ICD-10-CM | POA: Diagnosis not present

## 2017-09-19 DIAGNOSIS — E119 Type 2 diabetes mellitus without complications: Secondary | ICD-10-CM | POA: Diagnosis not present

## 2017-09-19 DIAGNOSIS — N2581 Secondary hyperparathyroidism of renal origin: Secondary | ICD-10-CM | POA: Diagnosis not present

## 2017-09-19 DIAGNOSIS — D509 Iron deficiency anemia, unspecified: Secondary | ICD-10-CM | POA: Diagnosis not present

## 2017-09-21 DIAGNOSIS — N2581 Secondary hyperparathyroidism of renal origin: Secondary | ICD-10-CM | POA: Diagnosis not present

## 2017-09-21 DIAGNOSIS — N186 End stage renal disease: Secondary | ICD-10-CM | POA: Diagnosis not present

## 2017-09-21 DIAGNOSIS — E119 Type 2 diabetes mellitus without complications: Secondary | ICD-10-CM | POA: Diagnosis not present

## 2017-09-21 DIAGNOSIS — D509 Iron deficiency anemia, unspecified: Secondary | ICD-10-CM | POA: Diagnosis not present

## 2017-09-23 DIAGNOSIS — D509 Iron deficiency anemia, unspecified: Secondary | ICD-10-CM | POA: Diagnosis not present

## 2017-09-23 DIAGNOSIS — N186 End stage renal disease: Secondary | ICD-10-CM | POA: Diagnosis not present

## 2017-09-23 DIAGNOSIS — N2581 Secondary hyperparathyroidism of renal origin: Secondary | ICD-10-CM | POA: Diagnosis not present

## 2017-09-23 DIAGNOSIS — E119 Type 2 diabetes mellitus without complications: Secondary | ICD-10-CM | POA: Diagnosis not present

## 2017-09-26 DIAGNOSIS — D509 Iron deficiency anemia, unspecified: Secondary | ICD-10-CM | POA: Diagnosis not present

## 2017-09-26 DIAGNOSIS — N2581 Secondary hyperparathyroidism of renal origin: Secondary | ICD-10-CM | POA: Diagnosis not present

## 2017-09-26 DIAGNOSIS — E119 Type 2 diabetes mellitus without complications: Secondary | ICD-10-CM | POA: Diagnosis not present

## 2017-09-26 DIAGNOSIS — N186 End stage renal disease: Secondary | ICD-10-CM | POA: Diagnosis not present

## 2017-09-28 DIAGNOSIS — D509 Iron deficiency anemia, unspecified: Secondary | ICD-10-CM | POA: Diagnosis not present

## 2017-09-28 DIAGNOSIS — N186 End stage renal disease: Secondary | ICD-10-CM | POA: Diagnosis not present

## 2017-09-28 DIAGNOSIS — N2581 Secondary hyperparathyroidism of renal origin: Secondary | ICD-10-CM | POA: Diagnosis not present

## 2017-09-28 DIAGNOSIS — E119 Type 2 diabetes mellitus without complications: Secondary | ICD-10-CM | POA: Diagnosis not present

## 2017-09-30 DIAGNOSIS — E119 Type 2 diabetes mellitus without complications: Secondary | ICD-10-CM | POA: Diagnosis not present

## 2017-09-30 DIAGNOSIS — N186 End stage renal disease: Secondary | ICD-10-CM | POA: Diagnosis not present

## 2017-09-30 DIAGNOSIS — D509 Iron deficiency anemia, unspecified: Secondary | ICD-10-CM | POA: Diagnosis not present

## 2017-09-30 DIAGNOSIS — N2581 Secondary hyperparathyroidism of renal origin: Secondary | ICD-10-CM | POA: Diagnosis not present

## 2017-10-02 DIAGNOSIS — E119 Type 2 diabetes mellitus without complications: Secondary | ICD-10-CM | POA: Diagnosis not present

## 2017-10-02 DIAGNOSIS — D509 Iron deficiency anemia, unspecified: Secondary | ICD-10-CM | POA: Diagnosis not present

## 2017-10-02 DIAGNOSIS — N186 End stage renal disease: Secondary | ICD-10-CM | POA: Diagnosis not present

## 2017-10-02 DIAGNOSIS — N2581 Secondary hyperparathyroidism of renal origin: Secondary | ICD-10-CM | POA: Diagnosis not present

## 2017-10-04 DIAGNOSIS — N2581 Secondary hyperparathyroidism of renal origin: Secondary | ICD-10-CM | POA: Diagnosis not present

## 2017-10-04 DIAGNOSIS — N186 End stage renal disease: Secondary | ICD-10-CM | POA: Diagnosis not present

## 2017-10-04 DIAGNOSIS — E119 Type 2 diabetes mellitus without complications: Secondary | ICD-10-CM | POA: Diagnosis not present

## 2017-10-04 DIAGNOSIS — D509 Iron deficiency anemia, unspecified: Secondary | ICD-10-CM | POA: Diagnosis not present

## 2017-10-07 DIAGNOSIS — N186 End stage renal disease: Secondary | ICD-10-CM | POA: Diagnosis not present

## 2017-10-07 DIAGNOSIS — D509 Iron deficiency anemia, unspecified: Secondary | ICD-10-CM | POA: Diagnosis not present

## 2017-10-07 DIAGNOSIS — N2581 Secondary hyperparathyroidism of renal origin: Secondary | ICD-10-CM | POA: Diagnosis not present

## 2017-10-07 DIAGNOSIS — E119 Type 2 diabetes mellitus without complications: Secondary | ICD-10-CM | POA: Diagnosis not present

## 2017-10-10 DIAGNOSIS — N186 End stage renal disease: Secondary | ICD-10-CM | POA: Diagnosis not present

## 2017-10-10 DIAGNOSIS — N2581 Secondary hyperparathyroidism of renal origin: Secondary | ICD-10-CM | POA: Diagnosis not present

## 2017-10-10 DIAGNOSIS — E119 Type 2 diabetes mellitus without complications: Secondary | ICD-10-CM | POA: Diagnosis not present

## 2017-10-10 DIAGNOSIS — D509 Iron deficiency anemia, unspecified: Secondary | ICD-10-CM | POA: Diagnosis not present

## 2017-10-12 DIAGNOSIS — E119 Type 2 diabetes mellitus without complications: Secondary | ICD-10-CM | POA: Diagnosis not present

## 2017-10-12 DIAGNOSIS — D509 Iron deficiency anemia, unspecified: Secondary | ICD-10-CM | POA: Diagnosis not present

## 2017-10-12 DIAGNOSIS — N186 End stage renal disease: Secondary | ICD-10-CM | POA: Diagnosis not present

## 2017-10-12 DIAGNOSIS — N2581 Secondary hyperparathyroidism of renal origin: Secondary | ICD-10-CM | POA: Diagnosis not present

## 2017-10-14 DIAGNOSIS — N186 End stage renal disease: Secondary | ICD-10-CM | POA: Diagnosis not present

## 2017-10-14 DIAGNOSIS — E1129 Type 2 diabetes mellitus with other diabetic kidney complication: Secondary | ICD-10-CM | POA: Diagnosis not present

## 2017-10-14 DIAGNOSIS — D509 Iron deficiency anemia, unspecified: Secondary | ICD-10-CM | POA: Diagnosis not present

## 2017-10-14 DIAGNOSIS — E119 Type 2 diabetes mellitus without complications: Secondary | ICD-10-CM | POA: Diagnosis not present

## 2017-10-14 DIAGNOSIS — N2581 Secondary hyperparathyroidism of renal origin: Secondary | ICD-10-CM | POA: Diagnosis not present

## 2017-10-14 DIAGNOSIS — Z992 Dependence on renal dialysis: Secondary | ICD-10-CM | POA: Diagnosis not present

## 2017-10-17 DIAGNOSIS — N2581 Secondary hyperparathyroidism of renal origin: Secondary | ICD-10-CM | POA: Diagnosis not present

## 2017-10-17 DIAGNOSIS — N186 End stage renal disease: Secondary | ICD-10-CM | POA: Diagnosis not present

## 2017-10-17 DIAGNOSIS — D509 Iron deficiency anemia, unspecified: Secondary | ICD-10-CM | POA: Diagnosis not present

## 2017-10-17 DIAGNOSIS — E119 Type 2 diabetes mellitus without complications: Secondary | ICD-10-CM | POA: Diagnosis not present

## 2017-10-19 DIAGNOSIS — N2581 Secondary hyperparathyroidism of renal origin: Secondary | ICD-10-CM | POA: Diagnosis not present

## 2017-10-19 DIAGNOSIS — E119 Type 2 diabetes mellitus without complications: Secondary | ICD-10-CM | POA: Diagnosis not present

## 2017-10-19 DIAGNOSIS — D509 Iron deficiency anemia, unspecified: Secondary | ICD-10-CM | POA: Diagnosis not present

## 2017-10-19 DIAGNOSIS — N186 End stage renal disease: Secondary | ICD-10-CM | POA: Diagnosis not present

## 2017-10-21 DIAGNOSIS — D509 Iron deficiency anemia, unspecified: Secondary | ICD-10-CM | POA: Diagnosis not present

## 2017-10-21 DIAGNOSIS — E119 Type 2 diabetes mellitus without complications: Secondary | ICD-10-CM | POA: Diagnosis not present

## 2017-10-21 DIAGNOSIS — N2581 Secondary hyperparathyroidism of renal origin: Secondary | ICD-10-CM | POA: Diagnosis not present

## 2017-10-21 DIAGNOSIS — N186 End stage renal disease: Secondary | ICD-10-CM | POA: Diagnosis not present

## 2017-10-24 DIAGNOSIS — N186 End stage renal disease: Secondary | ICD-10-CM | POA: Diagnosis not present

## 2017-10-24 DIAGNOSIS — E119 Type 2 diabetes mellitus without complications: Secondary | ICD-10-CM | POA: Diagnosis not present

## 2017-10-24 DIAGNOSIS — D509 Iron deficiency anemia, unspecified: Secondary | ICD-10-CM | POA: Diagnosis not present

## 2017-10-24 DIAGNOSIS — N2581 Secondary hyperparathyroidism of renal origin: Secondary | ICD-10-CM | POA: Diagnosis not present

## 2017-10-26 DIAGNOSIS — N2581 Secondary hyperparathyroidism of renal origin: Secondary | ICD-10-CM | POA: Diagnosis not present

## 2017-10-26 DIAGNOSIS — N186 End stage renal disease: Secondary | ICD-10-CM | POA: Diagnosis not present

## 2017-10-26 DIAGNOSIS — D509 Iron deficiency anemia, unspecified: Secondary | ICD-10-CM | POA: Diagnosis not present

## 2017-10-26 DIAGNOSIS — E119 Type 2 diabetes mellitus without complications: Secondary | ICD-10-CM | POA: Diagnosis not present

## 2017-10-28 DIAGNOSIS — D509 Iron deficiency anemia, unspecified: Secondary | ICD-10-CM | POA: Diagnosis not present

## 2017-10-28 DIAGNOSIS — E119 Type 2 diabetes mellitus without complications: Secondary | ICD-10-CM | POA: Diagnosis not present

## 2017-10-28 DIAGNOSIS — N186 End stage renal disease: Secondary | ICD-10-CM | POA: Diagnosis not present

## 2017-10-28 DIAGNOSIS — N2581 Secondary hyperparathyroidism of renal origin: Secondary | ICD-10-CM | POA: Diagnosis not present

## 2017-10-31 DIAGNOSIS — N2581 Secondary hyperparathyroidism of renal origin: Secondary | ICD-10-CM | POA: Diagnosis not present

## 2017-10-31 DIAGNOSIS — D509 Iron deficiency anemia, unspecified: Secondary | ICD-10-CM | POA: Diagnosis not present

## 2017-10-31 DIAGNOSIS — N186 End stage renal disease: Secondary | ICD-10-CM | POA: Diagnosis not present

## 2017-10-31 DIAGNOSIS — E119 Type 2 diabetes mellitus without complications: Secondary | ICD-10-CM | POA: Diagnosis not present

## 2017-11-02 DIAGNOSIS — D509 Iron deficiency anemia, unspecified: Secondary | ICD-10-CM | POA: Diagnosis not present

## 2017-11-02 DIAGNOSIS — N2581 Secondary hyperparathyroidism of renal origin: Secondary | ICD-10-CM | POA: Diagnosis not present

## 2017-11-02 DIAGNOSIS — E119 Type 2 diabetes mellitus without complications: Secondary | ICD-10-CM | POA: Diagnosis not present

## 2017-11-02 DIAGNOSIS — N186 End stage renal disease: Secondary | ICD-10-CM | POA: Diagnosis not present

## 2017-11-04 DIAGNOSIS — D509 Iron deficiency anemia, unspecified: Secondary | ICD-10-CM | POA: Diagnosis not present

## 2017-11-04 DIAGNOSIS — N186 End stage renal disease: Secondary | ICD-10-CM | POA: Diagnosis not present

## 2017-11-04 DIAGNOSIS — N2581 Secondary hyperparathyroidism of renal origin: Secondary | ICD-10-CM | POA: Diagnosis not present

## 2017-11-04 DIAGNOSIS — E119 Type 2 diabetes mellitus without complications: Secondary | ICD-10-CM | POA: Diagnosis not present

## 2017-11-06 DIAGNOSIS — N2581 Secondary hyperparathyroidism of renal origin: Secondary | ICD-10-CM | POA: Diagnosis not present

## 2017-11-06 DIAGNOSIS — D509 Iron deficiency anemia, unspecified: Secondary | ICD-10-CM | POA: Diagnosis not present

## 2017-11-06 DIAGNOSIS — N186 End stage renal disease: Secondary | ICD-10-CM | POA: Diagnosis not present

## 2017-11-06 DIAGNOSIS — E119 Type 2 diabetes mellitus without complications: Secondary | ICD-10-CM | POA: Diagnosis not present

## 2017-11-09 DIAGNOSIS — N186 End stage renal disease: Secondary | ICD-10-CM | POA: Diagnosis not present

## 2017-11-09 DIAGNOSIS — N2581 Secondary hyperparathyroidism of renal origin: Secondary | ICD-10-CM | POA: Diagnosis not present

## 2017-11-09 DIAGNOSIS — E119 Type 2 diabetes mellitus without complications: Secondary | ICD-10-CM | POA: Diagnosis not present

## 2017-11-09 DIAGNOSIS — D509 Iron deficiency anemia, unspecified: Secondary | ICD-10-CM | POA: Diagnosis not present

## 2017-11-11 DIAGNOSIS — E119 Type 2 diabetes mellitus without complications: Secondary | ICD-10-CM | POA: Diagnosis not present

## 2017-11-11 DIAGNOSIS — N186 End stage renal disease: Secondary | ICD-10-CM | POA: Diagnosis not present

## 2017-11-11 DIAGNOSIS — D509 Iron deficiency anemia, unspecified: Secondary | ICD-10-CM | POA: Diagnosis not present

## 2017-11-11 DIAGNOSIS — N2581 Secondary hyperparathyroidism of renal origin: Secondary | ICD-10-CM | POA: Diagnosis not present

## 2017-11-13 DIAGNOSIS — E119 Type 2 diabetes mellitus without complications: Secondary | ICD-10-CM | POA: Diagnosis not present

## 2017-11-13 DIAGNOSIS — N2581 Secondary hyperparathyroidism of renal origin: Secondary | ICD-10-CM | POA: Diagnosis not present

## 2017-11-13 DIAGNOSIS — N186 End stage renal disease: Secondary | ICD-10-CM | POA: Diagnosis not present

## 2017-11-13 DIAGNOSIS — D509 Iron deficiency anemia, unspecified: Secondary | ICD-10-CM | POA: Diagnosis not present

## 2017-11-14 DIAGNOSIS — E1129 Type 2 diabetes mellitus with other diabetic kidney complication: Secondary | ICD-10-CM | POA: Diagnosis not present

## 2017-11-14 DIAGNOSIS — N186 End stage renal disease: Secondary | ICD-10-CM | POA: Diagnosis not present

## 2017-11-14 DIAGNOSIS — Z992 Dependence on renal dialysis: Secondary | ICD-10-CM | POA: Diagnosis not present

## 2017-11-16 DIAGNOSIS — N186 End stage renal disease: Secondary | ICD-10-CM | POA: Diagnosis not present

## 2017-11-16 DIAGNOSIS — E119 Type 2 diabetes mellitus without complications: Secondary | ICD-10-CM | POA: Diagnosis not present

## 2017-11-16 DIAGNOSIS — D509 Iron deficiency anemia, unspecified: Secondary | ICD-10-CM | POA: Diagnosis not present

## 2017-11-16 DIAGNOSIS — N2581 Secondary hyperparathyroidism of renal origin: Secondary | ICD-10-CM | POA: Diagnosis not present

## 2017-11-18 DIAGNOSIS — N186 End stage renal disease: Secondary | ICD-10-CM | POA: Diagnosis not present

## 2017-11-18 DIAGNOSIS — E119 Type 2 diabetes mellitus without complications: Secondary | ICD-10-CM | POA: Diagnosis not present

## 2017-11-18 DIAGNOSIS — D509 Iron deficiency anemia, unspecified: Secondary | ICD-10-CM | POA: Diagnosis not present

## 2017-11-18 DIAGNOSIS — N2581 Secondary hyperparathyroidism of renal origin: Secondary | ICD-10-CM | POA: Diagnosis not present

## 2017-11-21 ENCOUNTER — Ambulatory Visit: Payer: Medicare Other | Admitting: Podiatry

## 2017-11-21 DIAGNOSIS — D509 Iron deficiency anemia, unspecified: Secondary | ICD-10-CM | POA: Diagnosis not present

## 2017-11-21 DIAGNOSIS — E119 Type 2 diabetes mellitus without complications: Secondary | ICD-10-CM | POA: Diagnosis not present

## 2017-11-21 DIAGNOSIS — N2581 Secondary hyperparathyroidism of renal origin: Secondary | ICD-10-CM | POA: Diagnosis not present

## 2017-11-21 DIAGNOSIS — N186 End stage renal disease: Secondary | ICD-10-CM | POA: Diagnosis not present

## 2017-11-23 DIAGNOSIS — N2581 Secondary hyperparathyroidism of renal origin: Secondary | ICD-10-CM | POA: Diagnosis not present

## 2017-11-23 DIAGNOSIS — D509 Iron deficiency anemia, unspecified: Secondary | ICD-10-CM | POA: Diagnosis not present

## 2017-11-23 DIAGNOSIS — E119 Type 2 diabetes mellitus without complications: Secondary | ICD-10-CM | POA: Diagnosis not present

## 2017-11-23 DIAGNOSIS — N186 End stage renal disease: Secondary | ICD-10-CM | POA: Diagnosis not present

## 2017-11-24 ENCOUNTER — Ambulatory Visit (INDEPENDENT_AMBULATORY_CARE_PROVIDER_SITE_OTHER): Payer: Medicare Other | Admitting: Podiatry

## 2017-11-24 ENCOUNTER — Encounter: Payer: Self-pay | Admitting: Podiatry

## 2017-11-24 DIAGNOSIS — B351 Tinea unguium: Secondary | ICD-10-CM | POA: Diagnosis not present

## 2017-11-24 DIAGNOSIS — E1142 Type 2 diabetes mellitus with diabetic polyneuropathy: Secondary | ICD-10-CM

## 2017-11-24 DIAGNOSIS — M79674 Pain in right toe(s): Secondary | ICD-10-CM | POA: Diagnosis not present

## 2017-11-24 DIAGNOSIS — M79675 Pain in left toe(s): Secondary | ICD-10-CM

## 2017-11-24 NOTE — Progress Notes (Signed)
Subjective:   Patient ID: [name], male   DOB: 63 y.o.   MRN: [id number]   HPI Patient presents with incurvated nailbeds 1-5 both feet that are thick yellow brittle and possible for the patient to cut   ROS      Objective:  Physical Exam  Neurovascular status intact with mycotic nailbeds 1-5 bilateral that are painful when palpated     Assessment:  Ingrown toenail deformity with mycotic component 1-5 both feet with pain     Plan:  Reviewed condition debrided nailbeds 1-5 both feet with no iatrogenic bleeding and instructed on continued routine care to be done

## 2017-11-25 DIAGNOSIS — N186 End stage renal disease: Secondary | ICD-10-CM | POA: Diagnosis not present

## 2017-11-25 DIAGNOSIS — E119 Type 2 diabetes mellitus without complications: Secondary | ICD-10-CM | POA: Diagnosis not present

## 2017-11-25 DIAGNOSIS — D509 Iron deficiency anemia, unspecified: Secondary | ICD-10-CM | POA: Diagnosis not present

## 2017-11-25 DIAGNOSIS — N2581 Secondary hyperparathyroidism of renal origin: Secondary | ICD-10-CM | POA: Diagnosis not present

## 2017-11-28 DIAGNOSIS — E119 Type 2 diabetes mellitus without complications: Secondary | ICD-10-CM | POA: Diagnosis not present

## 2017-11-28 DIAGNOSIS — N2581 Secondary hyperparathyroidism of renal origin: Secondary | ICD-10-CM | POA: Diagnosis not present

## 2017-11-28 DIAGNOSIS — N186 End stage renal disease: Secondary | ICD-10-CM | POA: Diagnosis not present

## 2017-11-28 DIAGNOSIS — D509 Iron deficiency anemia, unspecified: Secondary | ICD-10-CM | POA: Diagnosis not present

## 2017-11-30 DIAGNOSIS — N2581 Secondary hyperparathyroidism of renal origin: Secondary | ICD-10-CM | POA: Diagnosis not present

## 2017-11-30 DIAGNOSIS — N186 End stage renal disease: Secondary | ICD-10-CM | POA: Diagnosis not present

## 2017-11-30 DIAGNOSIS — E119 Type 2 diabetes mellitus without complications: Secondary | ICD-10-CM | POA: Diagnosis not present

## 2017-11-30 DIAGNOSIS — D509 Iron deficiency anemia, unspecified: Secondary | ICD-10-CM | POA: Diagnosis not present

## 2017-12-02 DIAGNOSIS — D509 Iron deficiency anemia, unspecified: Secondary | ICD-10-CM | POA: Diagnosis not present

## 2017-12-02 DIAGNOSIS — E119 Type 2 diabetes mellitus without complications: Secondary | ICD-10-CM | POA: Diagnosis not present

## 2017-12-02 DIAGNOSIS — N2581 Secondary hyperparathyroidism of renal origin: Secondary | ICD-10-CM | POA: Diagnosis not present

## 2017-12-02 DIAGNOSIS — N186 End stage renal disease: Secondary | ICD-10-CM | POA: Diagnosis not present

## 2017-12-05 DIAGNOSIS — N186 End stage renal disease: Secondary | ICD-10-CM | POA: Diagnosis not present

## 2017-12-05 DIAGNOSIS — E119 Type 2 diabetes mellitus without complications: Secondary | ICD-10-CM | POA: Diagnosis not present

## 2017-12-05 DIAGNOSIS — N2581 Secondary hyperparathyroidism of renal origin: Secondary | ICD-10-CM | POA: Diagnosis not present

## 2017-12-05 DIAGNOSIS — D509 Iron deficiency anemia, unspecified: Secondary | ICD-10-CM | POA: Diagnosis not present

## 2017-12-07 DIAGNOSIS — E119 Type 2 diabetes mellitus without complications: Secondary | ICD-10-CM | POA: Diagnosis not present

## 2017-12-07 DIAGNOSIS — N186 End stage renal disease: Secondary | ICD-10-CM | POA: Diagnosis not present

## 2017-12-07 DIAGNOSIS — D509 Iron deficiency anemia, unspecified: Secondary | ICD-10-CM | POA: Diagnosis not present

## 2017-12-07 DIAGNOSIS — N2581 Secondary hyperparathyroidism of renal origin: Secondary | ICD-10-CM | POA: Diagnosis not present

## 2017-12-09 DIAGNOSIS — D509 Iron deficiency anemia, unspecified: Secondary | ICD-10-CM | POA: Diagnosis not present

## 2017-12-09 DIAGNOSIS — N186 End stage renal disease: Secondary | ICD-10-CM | POA: Diagnosis not present

## 2017-12-09 DIAGNOSIS — E119 Type 2 diabetes mellitus without complications: Secondary | ICD-10-CM | POA: Diagnosis not present

## 2017-12-09 DIAGNOSIS — N2581 Secondary hyperparathyroidism of renal origin: Secondary | ICD-10-CM | POA: Diagnosis not present

## 2017-12-12 DIAGNOSIS — E119 Type 2 diabetes mellitus without complications: Secondary | ICD-10-CM | POA: Diagnosis not present

## 2017-12-12 DIAGNOSIS — N186 End stage renal disease: Secondary | ICD-10-CM | POA: Diagnosis not present

## 2017-12-12 DIAGNOSIS — N2581 Secondary hyperparathyroidism of renal origin: Secondary | ICD-10-CM | POA: Diagnosis not present

## 2017-12-12 DIAGNOSIS — D509 Iron deficiency anemia, unspecified: Secondary | ICD-10-CM | POA: Diagnosis not present

## 2017-12-14 DIAGNOSIS — N2581 Secondary hyperparathyroidism of renal origin: Secondary | ICD-10-CM | POA: Diagnosis not present

## 2017-12-14 DIAGNOSIS — N186 End stage renal disease: Secondary | ICD-10-CM | POA: Diagnosis not present

## 2017-12-14 DIAGNOSIS — D509 Iron deficiency anemia, unspecified: Secondary | ICD-10-CM | POA: Diagnosis not present

## 2017-12-14 DIAGNOSIS — E119 Type 2 diabetes mellitus without complications: Secondary | ICD-10-CM | POA: Diagnosis not present

## 2017-12-15 DIAGNOSIS — N186 End stage renal disease: Secondary | ICD-10-CM | POA: Diagnosis not present

## 2017-12-15 DIAGNOSIS — T82858A Stenosis of vascular prosthetic devices, implants and grafts, initial encounter: Secondary | ICD-10-CM | POA: Diagnosis not present

## 2017-12-15 DIAGNOSIS — Z992 Dependence on renal dialysis: Secondary | ICD-10-CM | POA: Diagnosis not present

## 2017-12-15 DIAGNOSIS — E1129 Type 2 diabetes mellitus with other diabetic kidney complication: Secondary | ICD-10-CM | POA: Diagnosis not present

## 2017-12-15 DIAGNOSIS — I871 Compression of vein: Secondary | ICD-10-CM | POA: Diagnosis not present

## 2017-12-16 DIAGNOSIS — E1129 Type 2 diabetes mellitus with other diabetic kidney complication: Secondary | ICD-10-CM | POA: Diagnosis not present

## 2017-12-16 DIAGNOSIS — Z992 Dependence on renal dialysis: Secondary | ICD-10-CM | POA: Diagnosis not present

## 2017-12-16 DIAGNOSIS — D509 Iron deficiency anemia, unspecified: Secondary | ICD-10-CM | POA: Diagnosis not present

## 2017-12-16 DIAGNOSIS — N186 End stage renal disease: Secondary | ICD-10-CM | POA: Diagnosis not present

## 2017-12-16 DIAGNOSIS — N2581 Secondary hyperparathyroidism of renal origin: Secondary | ICD-10-CM | POA: Diagnosis not present

## 2017-12-16 DIAGNOSIS — E119 Type 2 diabetes mellitus without complications: Secondary | ICD-10-CM | POA: Diagnosis not present

## 2017-12-19 DIAGNOSIS — N186 End stage renal disease: Secondary | ICD-10-CM | POA: Diagnosis not present

## 2017-12-19 DIAGNOSIS — N2581 Secondary hyperparathyroidism of renal origin: Secondary | ICD-10-CM | POA: Diagnosis not present

## 2017-12-19 DIAGNOSIS — E119 Type 2 diabetes mellitus without complications: Secondary | ICD-10-CM | POA: Diagnosis not present

## 2017-12-19 DIAGNOSIS — D509 Iron deficiency anemia, unspecified: Secondary | ICD-10-CM | POA: Diagnosis not present

## 2017-12-21 DIAGNOSIS — E119 Type 2 diabetes mellitus without complications: Secondary | ICD-10-CM | POA: Diagnosis not present

## 2017-12-21 DIAGNOSIS — N186 End stage renal disease: Secondary | ICD-10-CM | POA: Diagnosis not present

## 2017-12-21 DIAGNOSIS — D509 Iron deficiency anemia, unspecified: Secondary | ICD-10-CM | POA: Diagnosis not present

## 2017-12-21 DIAGNOSIS — N2581 Secondary hyperparathyroidism of renal origin: Secondary | ICD-10-CM | POA: Diagnosis not present

## 2017-12-23 DIAGNOSIS — E119 Type 2 diabetes mellitus without complications: Secondary | ICD-10-CM | POA: Diagnosis not present

## 2017-12-23 DIAGNOSIS — N2581 Secondary hyperparathyroidism of renal origin: Secondary | ICD-10-CM | POA: Diagnosis not present

## 2017-12-23 DIAGNOSIS — N186 End stage renal disease: Secondary | ICD-10-CM | POA: Diagnosis not present

## 2017-12-23 DIAGNOSIS — D509 Iron deficiency anemia, unspecified: Secondary | ICD-10-CM | POA: Diagnosis not present

## 2017-12-26 DIAGNOSIS — N186 End stage renal disease: Secondary | ICD-10-CM | POA: Diagnosis not present

## 2017-12-26 DIAGNOSIS — D509 Iron deficiency anemia, unspecified: Secondary | ICD-10-CM | POA: Diagnosis not present

## 2017-12-26 DIAGNOSIS — E119 Type 2 diabetes mellitus without complications: Secondary | ICD-10-CM | POA: Diagnosis not present

## 2017-12-26 DIAGNOSIS — N2581 Secondary hyperparathyroidism of renal origin: Secondary | ICD-10-CM | POA: Diagnosis not present

## 2017-12-28 DIAGNOSIS — N186 End stage renal disease: Secondary | ICD-10-CM | POA: Diagnosis not present

## 2017-12-28 DIAGNOSIS — N2581 Secondary hyperparathyroidism of renal origin: Secondary | ICD-10-CM | POA: Diagnosis not present

## 2017-12-28 DIAGNOSIS — D509 Iron deficiency anemia, unspecified: Secondary | ICD-10-CM | POA: Diagnosis not present

## 2017-12-28 DIAGNOSIS — E119 Type 2 diabetes mellitus without complications: Secondary | ICD-10-CM | POA: Diagnosis not present

## 2017-12-30 DIAGNOSIS — N186 End stage renal disease: Secondary | ICD-10-CM | POA: Diagnosis not present

## 2017-12-30 DIAGNOSIS — N2581 Secondary hyperparathyroidism of renal origin: Secondary | ICD-10-CM | POA: Diagnosis not present

## 2017-12-30 DIAGNOSIS — D509 Iron deficiency anemia, unspecified: Secondary | ICD-10-CM | POA: Diagnosis not present

## 2017-12-30 DIAGNOSIS — E119 Type 2 diabetes mellitus without complications: Secondary | ICD-10-CM | POA: Diagnosis not present

## 2018-01-02 DIAGNOSIS — N186 End stage renal disease: Secondary | ICD-10-CM | POA: Diagnosis not present

## 2018-01-02 DIAGNOSIS — E119 Type 2 diabetes mellitus without complications: Secondary | ICD-10-CM | POA: Diagnosis not present

## 2018-01-02 DIAGNOSIS — N2581 Secondary hyperparathyroidism of renal origin: Secondary | ICD-10-CM | POA: Diagnosis not present

## 2018-01-02 DIAGNOSIS — D509 Iron deficiency anemia, unspecified: Secondary | ICD-10-CM | POA: Diagnosis not present

## 2018-01-04 DIAGNOSIS — N186 End stage renal disease: Secondary | ICD-10-CM | POA: Diagnosis not present

## 2018-01-04 DIAGNOSIS — N2581 Secondary hyperparathyroidism of renal origin: Secondary | ICD-10-CM | POA: Diagnosis not present

## 2018-01-04 DIAGNOSIS — D509 Iron deficiency anemia, unspecified: Secondary | ICD-10-CM | POA: Diagnosis not present

## 2018-01-04 DIAGNOSIS — E119 Type 2 diabetes mellitus without complications: Secondary | ICD-10-CM | POA: Diagnosis not present

## 2018-01-06 DIAGNOSIS — E119 Type 2 diabetes mellitus without complications: Secondary | ICD-10-CM | POA: Diagnosis not present

## 2018-01-06 DIAGNOSIS — D509 Iron deficiency anemia, unspecified: Secondary | ICD-10-CM | POA: Diagnosis not present

## 2018-01-06 DIAGNOSIS — N2581 Secondary hyperparathyroidism of renal origin: Secondary | ICD-10-CM | POA: Diagnosis not present

## 2018-01-06 DIAGNOSIS — N186 End stage renal disease: Secondary | ICD-10-CM | POA: Diagnosis not present

## 2018-01-09 DIAGNOSIS — N2581 Secondary hyperparathyroidism of renal origin: Secondary | ICD-10-CM | POA: Diagnosis not present

## 2018-01-09 DIAGNOSIS — N186 End stage renal disease: Secondary | ICD-10-CM | POA: Diagnosis not present

## 2018-01-09 DIAGNOSIS — E119 Type 2 diabetes mellitus without complications: Secondary | ICD-10-CM | POA: Diagnosis not present

## 2018-01-09 DIAGNOSIS — D509 Iron deficiency anemia, unspecified: Secondary | ICD-10-CM | POA: Diagnosis not present

## 2018-01-11 DIAGNOSIS — N186 End stage renal disease: Secondary | ICD-10-CM | POA: Diagnosis not present

## 2018-01-11 DIAGNOSIS — D509 Iron deficiency anemia, unspecified: Secondary | ICD-10-CM | POA: Diagnosis not present

## 2018-01-11 DIAGNOSIS — E119 Type 2 diabetes mellitus without complications: Secondary | ICD-10-CM | POA: Diagnosis not present

## 2018-01-11 DIAGNOSIS — N2581 Secondary hyperparathyroidism of renal origin: Secondary | ICD-10-CM | POA: Diagnosis not present

## 2018-01-13 DIAGNOSIS — E1129 Type 2 diabetes mellitus with other diabetic kidney complication: Secondary | ICD-10-CM | POA: Diagnosis not present

## 2018-01-13 DIAGNOSIS — N186 End stage renal disease: Secondary | ICD-10-CM | POA: Diagnosis not present

## 2018-01-13 DIAGNOSIS — N2581 Secondary hyperparathyroidism of renal origin: Secondary | ICD-10-CM | POA: Diagnosis not present

## 2018-01-13 DIAGNOSIS — E119 Type 2 diabetes mellitus without complications: Secondary | ICD-10-CM | POA: Diagnosis not present

## 2018-01-13 DIAGNOSIS — Z992 Dependence on renal dialysis: Secondary | ICD-10-CM | POA: Diagnosis not present

## 2018-01-16 DIAGNOSIS — E119 Type 2 diabetes mellitus without complications: Secondary | ICD-10-CM | POA: Diagnosis not present

## 2018-01-16 DIAGNOSIS — N186 End stage renal disease: Secondary | ICD-10-CM | POA: Diagnosis not present

## 2018-01-16 DIAGNOSIS — N2581 Secondary hyperparathyroidism of renal origin: Secondary | ICD-10-CM | POA: Diagnosis not present

## 2018-01-18 DIAGNOSIS — N186 End stage renal disease: Secondary | ICD-10-CM | POA: Diagnosis not present

## 2018-01-18 DIAGNOSIS — N2581 Secondary hyperparathyroidism of renal origin: Secondary | ICD-10-CM | POA: Diagnosis not present

## 2018-01-18 DIAGNOSIS — E119 Type 2 diabetes mellitus without complications: Secondary | ICD-10-CM | POA: Diagnosis not present

## 2018-01-20 DIAGNOSIS — N2581 Secondary hyperparathyroidism of renal origin: Secondary | ICD-10-CM | POA: Diagnosis not present

## 2018-01-20 DIAGNOSIS — N186 End stage renal disease: Secondary | ICD-10-CM | POA: Diagnosis not present

## 2018-01-20 DIAGNOSIS — E119 Type 2 diabetes mellitus without complications: Secondary | ICD-10-CM | POA: Diagnosis not present

## 2018-01-23 DIAGNOSIS — E119 Type 2 diabetes mellitus without complications: Secondary | ICD-10-CM | POA: Diagnosis not present

## 2018-01-23 DIAGNOSIS — N186 End stage renal disease: Secondary | ICD-10-CM | POA: Diagnosis not present

## 2018-01-23 DIAGNOSIS — N2581 Secondary hyperparathyroidism of renal origin: Secondary | ICD-10-CM | POA: Diagnosis not present

## 2018-01-25 DIAGNOSIS — N2581 Secondary hyperparathyroidism of renal origin: Secondary | ICD-10-CM | POA: Diagnosis not present

## 2018-01-25 DIAGNOSIS — E119 Type 2 diabetes mellitus without complications: Secondary | ICD-10-CM | POA: Diagnosis not present

## 2018-01-25 DIAGNOSIS — N186 End stage renal disease: Secondary | ICD-10-CM | POA: Diagnosis not present

## 2018-01-27 DIAGNOSIS — N186 End stage renal disease: Secondary | ICD-10-CM | POA: Diagnosis not present

## 2018-01-27 DIAGNOSIS — E119 Type 2 diabetes mellitus without complications: Secondary | ICD-10-CM | POA: Diagnosis not present

## 2018-01-27 DIAGNOSIS — N2581 Secondary hyperparathyroidism of renal origin: Secondary | ICD-10-CM | POA: Diagnosis not present

## 2018-01-30 DIAGNOSIS — N186 End stage renal disease: Secondary | ICD-10-CM | POA: Diagnosis not present

## 2018-01-30 DIAGNOSIS — E119 Type 2 diabetes mellitus without complications: Secondary | ICD-10-CM | POA: Diagnosis not present

## 2018-01-30 DIAGNOSIS — N2581 Secondary hyperparathyroidism of renal origin: Secondary | ICD-10-CM | POA: Diagnosis not present

## 2018-02-01 DIAGNOSIS — N2581 Secondary hyperparathyroidism of renal origin: Secondary | ICD-10-CM | POA: Diagnosis not present

## 2018-02-01 DIAGNOSIS — N186 End stage renal disease: Secondary | ICD-10-CM | POA: Diagnosis not present

## 2018-02-01 DIAGNOSIS — E119 Type 2 diabetes mellitus without complications: Secondary | ICD-10-CM | POA: Diagnosis not present

## 2018-02-03 DIAGNOSIS — N2581 Secondary hyperparathyroidism of renal origin: Secondary | ICD-10-CM | POA: Diagnosis not present

## 2018-02-03 DIAGNOSIS — E119 Type 2 diabetes mellitus without complications: Secondary | ICD-10-CM | POA: Diagnosis not present

## 2018-02-03 DIAGNOSIS — N186 End stage renal disease: Secondary | ICD-10-CM | POA: Diagnosis not present

## 2018-02-06 DIAGNOSIS — E119 Type 2 diabetes mellitus without complications: Secondary | ICD-10-CM | POA: Diagnosis not present

## 2018-02-06 DIAGNOSIS — N2581 Secondary hyperparathyroidism of renal origin: Secondary | ICD-10-CM | POA: Diagnosis not present

## 2018-02-06 DIAGNOSIS — N186 End stage renal disease: Secondary | ICD-10-CM | POA: Diagnosis not present

## 2018-02-07 DIAGNOSIS — I10 Essential (primary) hypertension: Secondary | ICD-10-CM | POA: Diagnosis not present

## 2018-02-07 DIAGNOSIS — N186 End stage renal disease: Secondary | ICD-10-CM | POA: Diagnosis not present

## 2018-02-07 DIAGNOSIS — E1165 Type 2 diabetes mellitus with hyperglycemia: Secondary | ICD-10-CM | POA: Diagnosis not present

## 2018-02-07 DIAGNOSIS — E1140 Type 2 diabetes mellitus with diabetic neuropathy, unspecified: Secondary | ICD-10-CM | POA: Diagnosis not present

## 2018-02-07 DIAGNOSIS — E7800 Pure hypercholesterolemia, unspecified: Secondary | ICD-10-CM | POA: Diagnosis not present

## 2018-02-08 DIAGNOSIS — N2581 Secondary hyperparathyroidism of renal origin: Secondary | ICD-10-CM | POA: Diagnosis not present

## 2018-02-08 DIAGNOSIS — E119 Type 2 diabetes mellitus without complications: Secondary | ICD-10-CM | POA: Diagnosis not present

## 2018-02-08 DIAGNOSIS — N186 End stage renal disease: Secondary | ICD-10-CM | POA: Diagnosis not present

## 2018-02-10 DIAGNOSIS — N186 End stage renal disease: Secondary | ICD-10-CM | POA: Diagnosis not present

## 2018-02-10 DIAGNOSIS — N2581 Secondary hyperparathyroidism of renal origin: Secondary | ICD-10-CM | POA: Diagnosis not present

## 2018-02-10 DIAGNOSIS — E119 Type 2 diabetes mellitus without complications: Secondary | ICD-10-CM | POA: Diagnosis not present

## 2018-02-13 DIAGNOSIS — E119 Type 2 diabetes mellitus without complications: Secondary | ICD-10-CM | POA: Diagnosis not present

## 2018-02-13 DIAGNOSIS — N186 End stage renal disease: Secondary | ICD-10-CM | POA: Diagnosis not present

## 2018-02-13 DIAGNOSIS — Z992 Dependence on renal dialysis: Secondary | ICD-10-CM | POA: Diagnosis not present

## 2018-02-13 DIAGNOSIS — E1129 Type 2 diabetes mellitus with other diabetic kidney complication: Secondary | ICD-10-CM | POA: Diagnosis not present

## 2018-02-13 DIAGNOSIS — N2581 Secondary hyperparathyroidism of renal origin: Secondary | ICD-10-CM | POA: Diagnosis not present

## 2018-02-15 DIAGNOSIS — N2581 Secondary hyperparathyroidism of renal origin: Secondary | ICD-10-CM | POA: Diagnosis not present

## 2018-02-15 DIAGNOSIS — N186 End stage renal disease: Secondary | ICD-10-CM | POA: Diagnosis not present

## 2018-02-15 DIAGNOSIS — E119 Type 2 diabetes mellitus without complications: Secondary | ICD-10-CM | POA: Diagnosis not present

## 2018-02-17 DIAGNOSIS — N2581 Secondary hyperparathyroidism of renal origin: Secondary | ICD-10-CM | POA: Diagnosis not present

## 2018-02-17 DIAGNOSIS — N186 End stage renal disease: Secondary | ICD-10-CM | POA: Diagnosis not present

## 2018-02-17 DIAGNOSIS — E119 Type 2 diabetes mellitus without complications: Secondary | ICD-10-CM | POA: Diagnosis not present

## 2018-02-20 DIAGNOSIS — N186 End stage renal disease: Secondary | ICD-10-CM | POA: Diagnosis not present

## 2018-02-20 DIAGNOSIS — N2581 Secondary hyperparathyroidism of renal origin: Secondary | ICD-10-CM | POA: Diagnosis not present

## 2018-02-20 DIAGNOSIS — E119 Type 2 diabetes mellitus without complications: Secondary | ICD-10-CM | POA: Diagnosis not present

## 2018-02-22 DIAGNOSIS — E119 Type 2 diabetes mellitus without complications: Secondary | ICD-10-CM | POA: Diagnosis not present

## 2018-02-22 DIAGNOSIS — N186 End stage renal disease: Secondary | ICD-10-CM | POA: Diagnosis not present

## 2018-02-22 DIAGNOSIS — N2581 Secondary hyperparathyroidism of renal origin: Secondary | ICD-10-CM | POA: Diagnosis not present

## 2018-02-23 ENCOUNTER — Encounter: Payer: Self-pay | Admitting: Podiatry

## 2018-02-23 ENCOUNTER — Ambulatory Visit (INDEPENDENT_AMBULATORY_CARE_PROVIDER_SITE_OTHER): Payer: Medicare Other | Admitting: Podiatry

## 2018-02-23 DIAGNOSIS — E1142 Type 2 diabetes mellitus with diabetic polyneuropathy: Secondary | ICD-10-CM

## 2018-02-23 DIAGNOSIS — M79674 Pain in right toe(s): Secondary | ICD-10-CM | POA: Diagnosis not present

## 2018-02-23 DIAGNOSIS — B351 Tinea unguium: Secondary | ICD-10-CM

## 2018-02-23 DIAGNOSIS — M79675 Pain in left toe(s): Secondary | ICD-10-CM

## 2018-02-23 DIAGNOSIS — M2040 Other hammer toe(s) (acquired), unspecified foot: Secondary | ICD-10-CM

## 2018-02-23 NOTE — Progress Notes (Signed)
Subjective:   Patient ID: [name], male   DOB: 62 y.o.   MRN: [id number]   HPI Patient presents with chronic foot swelling nail disease 1-5 both feet that he cannot cut himself long-term diabetes and digital deformities bilateral   ROS      Objective:  Physical Exam  Neurovascular status unchanged with diminished sharp dull vibratory noted and edema in the ankle region with negative Homans sign noted.  Incurvated nailbeds 1-5 both feet that are thick yellow brittle with lesion sub-left metatarsal and digital deformities     Assessment:  Chronic mycotic nail infection with pain bilateral with lesion and edema noted     Plan:  Debride painful nailbeds 1-5 both feet with no iatrogenic bleeding and we will have patient get diabetic shoes in order to provide for better support of the feet and to hopefully reduce any chances for ulceration

## 2018-02-24 DIAGNOSIS — N2581 Secondary hyperparathyroidism of renal origin: Secondary | ICD-10-CM | POA: Diagnosis not present

## 2018-02-24 DIAGNOSIS — E119 Type 2 diabetes mellitus without complications: Secondary | ICD-10-CM | POA: Diagnosis not present

## 2018-02-24 DIAGNOSIS — N186 End stage renal disease: Secondary | ICD-10-CM | POA: Diagnosis not present

## 2018-02-27 DIAGNOSIS — N186 End stage renal disease: Secondary | ICD-10-CM | POA: Diagnosis not present

## 2018-02-27 DIAGNOSIS — N2581 Secondary hyperparathyroidism of renal origin: Secondary | ICD-10-CM | POA: Diagnosis not present

## 2018-02-27 DIAGNOSIS — E119 Type 2 diabetes mellitus without complications: Secondary | ICD-10-CM | POA: Diagnosis not present

## 2018-03-01 DIAGNOSIS — N2581 Secondary hyperparathyroidism of renal origin: Secondary | ICD-10-CM | POA: Diagnosis not present

## 2018-03-01 DIAGNOSIS — N186 End stage renal disease: Secondary | ICD-10-CM | POA: Diagnosis not present

## 2018-03-01 DIAGNOSIS — E119 Type 2 diabetes mellitus without complications: Secondary | ICD-10-CM | POA: Diagnosis not present

## 2018-03-03 DIAGNOSIS — N186 End stage renal disease: Secondary | ICD-10-CM | POA: Diagnosis not present

## 2018-03-03 DIAGNOSIS — E119 Type 2 diabetes mellitus without complications: Secondary | ICD-10-CM | POA: Diagnosis not present

## 2018-03-03 DIAGNOSIS — N2581 Secondary hyperparathyroidism of renal origin: Secondary | ICD-10-CM | POA: Diagnosis not present

## 2018-03-06 DIAGNOSIS — N2581 Secondary hyperparathyroidism of renal origin: Secondary | ICD-10-CM | POA: Diagnosis not present

## 2018-03-06 DIAGNOSIS — E119 Type 2 diabetes mellitus without complications: Secondary | ICD-10-CM | POA: Diagnosis not present

## 2018-03-06 DIAGNOSIS — N186 End stage renal disease: Secondary | ICD-10-CM | POA: Diagnosis not present

## 2018-03-08 DIAGNOSIS — N2581 Secondary hyperparathyroidism of renal origin: Secondary | ICD-10-CM | POA: Diagnosis not present

## 2018-03-08 DIAGNOSIS — E119 Type 2 diabetes mellitus without complications: Secondary | ICD-10-CM | POA: Diagnosis not present

## 2018-03-08 DIAGNOSIS — N186 End stage renal disease: Secondary | ICD-10-CM | POA: Diagnosis not present

## 2018-03-10 DIAGNOSIS — E119 Type 2 diabetes mellitus without complications: Secondary | ICD-10-CM | POA: Diagnosis not present

## 2018-03-10 DIAGNOSIS — N2581 Secondary hyperparathyroidism of renal origin: Secondary | ICD-10-CM | POA: Diagnosis not present

## 2018-03-10 DIAGNOSIS — N186 End stage renal disease: Secondary | ICD-10-CM | POA: Diagnosis not present

## 2018-03-13 ENCOUNTER — Telehealth: Payer: Self-pay | Admitting: Podiatry

## 2018-03-13 DIAGNOSIS — N186 End stage renal disease: Secondary | ICD-10-CM | POA: Diagnosis not present

## 2018-03-13 DIAGNOSIS — E119 Type 2 diabetes mellitus without complications: Secondary | ICD-10-CM | POA: Diagnosis not present

## 2018-03-13 DIAGNOSIS — N2581 Secondary hyperparathyroidism of renal origin: Secondary | ICD-10-CM | POA: Diagnosis not present

## 2018-03-13 NOTE — Telephone Encounter (Signed)
pts wife left voicemail [phone] asking for a call back.   Lvm for pts wife to give me a call back. It looks like pt needs an appt for diabetic shoe measurements.

## 2018-03-14 DIAGNOSIS — N186 End stage renal disease: Secondary | ICD-10-CM | POA: Diagnosis not present

## 2018-03-14 DIAGNOSIS — R600 Localized edema: Secondary | ICD-10-CM | POA: Diagnosis not present

## 2018-03-14 DIAGNOSIS — E782 Mixed hyperlipidemia: Secondary | ICD-10-CM | POA: Diagnosis not present

## 2018-03-14 DIAGNOSIS — D472 Monoclonal gammopathy: Secondary | ICD-10-CM | POA: Diagnosis not present

## 2018-03-14 DIAGNOSIS — E1142 Type 2 diabetes mellitus with diabetic polyneuropathy: Secondary | ICD-10-CM | POA: Diagnosis not present

## 2018-03-14 DIAGNOSIS — I509 Heart failure, unspecified: Secondary | ICD-10-CM | POA: Diagnosis not present

## 2018-03-14 DIAGNOSIS — G4730 Sleep apnea, unspecified: Secondary | ICD-10-CM | POA: Diagnosis not present

## 2018-03-14 DIAGNOSIS — Z6841 Body Mass Index (BMI) 40.0 and over, adult: Secondary | ICD-10-CM | POA: Diagnosis not present

## 2018-03-14 DIAGNOSIS — E1122 Type 2 diabetes mellitus with diabetic chronic kidney disease: Secondary | ICD-10-CM | POA: Diagnosis not present

## 2018-03-14 DIAGNOSIS — I10 Essential (primary) hypertension: Secondary | ICD-10-CM | POA: Diagnosis not present

## 2018-03-14 DIAGNOSIS — M109 Gout, unspecified: Secondary | ICD-10-CM | POA: Diagnosis not present

## 2018-03-15 DIAGNOSIS — D509 Iron deficiency anemia, unspecified: Secondary | ICD-10-CM | POA: Diagnosis not present

## 2018-03-15 DIAGNOSIS — E1129 Type 2 diabetes mellitus with other diabetic kidney complication: Secondary | ICD-10-CM | POA: Diagnosis not present

## 2018-03-15 DIAGNOSIS — N2581 Secondary hyperparathyroidism of renal origin: Secondary | ICD-10-CM | POA: Diagnosis not present

## 2018-03-15 DIAGNOSIS — Z992 Dependence on renal dialysis: Secondary | ICD-10-CM | POA: Diagnosis not present

## 2018-03-15 DIAGNOSIS — N186 End stage renal disease: Secondary | ICD-10-CM | POA: Diagnosis not present

## 2018-03-15 DIAGNOSIS — E119 Type 2 diabetes mellitus without complications: Secondary | ICD-10-CM | POA: Diagnosis not present

## 2018-03-17 DIAGNOSIS — D509 Iron deficiency anemia, unspecified: Secondary | ICD-10-CM | POA: Diagnosis not present

## 2018-03-17 DIAGNOSIS — E119 Type 2 diabetes mellitus without complications: Secondary | ICD-10-CM | POA: Diagnosis not present

## 2018-03-17 DIAGNOSIS — N2581 Secondary hyperparathyroidism of renal origin: Secondary | ICD-10-CM | POA: Diagnosis not present

## 2018-03-17 DIAGNOSIS — N186 End stage renal disease: Secondary | ICD-10-CM | POA: Diagnosis not present

## 2018-03-20 DIAGNOSIS — N2581 Secondary hyperparathyroidism of renal origin: Secondary | ICD-10-CM | POA: Diagnosis not present

## 2018-03-20 DIAGNOSIS — D509 Iron deficiency anemia, unspecified: Secondary | ICD-10-CM | POA: Diagnosis not present

## 2018-03-20 DIAGNOSIS — N186 End stage renal disease: Secondary | ICD-10-CM | POA: Diagnosis not present

## 2018-03-20 DIAGNOSIS — E119 Type 2 diabetes mellitus without complications: Secondary | ICD-10-CM | POA: Diagnosis not present

## 2018-03-22 DIAGNOSIS — D509 Iron deficiency anemia, unspecified: Secondary | ICD-10-CM | POA: Diagnosis not present

## 2018-03-22 DIAGNOSIS — N2581 Secondary hyperparathyroidism of renal origin: Secondary | ICD-10-CM | POA: Diagnosis not present

## 2018-03-22 DIAGNOSIS — E119 Type 2 diabetes mellitus without complications: Secondary | ICD-10-CM | POA: Diagnosis not present

## 2018-03-22 DIAGNOSIS — N186 End stage renal disease: Secondary | ICD-10-CM | POA: Diagnosis not present

## 2018-03-24 DIAGNOSIS — N2581 Secondary hyperparathyroidism of renal origin: Secondary | ICD-10-CM | POA: Diagnosis not present

## 2018-03-24 DIAGNOSIS — D509 Iron deficiency anemia, unspecified: Secondary | ICD-10-CM | POA: Diagnosis not present

## 2018-03-24 DIAGNOSIS — E119 Type 2 diabetes mellitus without complications: Secondary | ICD-10-CM | POA: Diagnosis not present

## 2018-03-24 DIAGNOSIS — N186 End stage renal disease: Secondary | ICD-10-CM | POA: Diagnosis not present

## 2018-03-27 DIAGNOSIS — N186 End stage renal disease: Secondary | ICD-10-CM | POA: Diagnosis not present

## 2018-03-27 DIAGNOSIS — N2581 Secondary hyperparathyroidism of renal origin: Secondary | ICD-10-CM | POA: Diagnosis not present

## 2018-03-27 DIAGNOSIS — D509 Iron deficiency anemia, unspecified: Secondary | ICD-10-CM | POA: Diagnosis not present

## 2018-03-27 DIAGNOSIS — E119 Type 2 diabetes mellitus without complications: Secondary | ICD-10-CM | POA: Diagnosis not present

## 2018-03-29 DIAGNOSIS — N186 End stage renal disease: Secondary | ICD-10-CM | POA: Diagnosis not present

## 2018-03-29 DIAGNOSIS — E119 Type 2 diabetes mellitus without complications: Secondary | ICD-10-CM | POA: Diagnosis not present

## 2018-03-29 DIAGNOSIS — N2581 Secondary hyperparathyroidism of renal origin: Secondary | ICD-10-CM | POA: Diagnosis not present

## 2018-03-29 DIAGNOSIS — D509 Iron deficiency anemia, unspecified: Secondary | ICD-10-CM | POA: Diagnosis not present

## 2018-03-31 DIAGNOSIS — D509 Iron deficiency anemia, unspecified: Secondary | ICD-10-CM | POA: Diagnosis not present

## 2018-03-31 DIAGNOSIS — N2581 Secondary hyperparathyroidism of renal origin: Secondary | ICD-10-CM | POA: Diagnosis not present

## 2018-03-31 DIAGNOSIS — N186 End stage renal disease: Secondary | ICD-10-CM | POA: Diagnosis not present

## 2018-03-31 DIAGNOSIS — E119 Type 2 diabetes mellitus without complications: Secondary | ICD-10-CM | POA: Diagnosis not present

## 2018-04-03 DIAGNOSIS — E119 Type 2 diabetes mellitus without complications: Secondary | ICD-10-CM | POA: Diagnosis not present

## 2018-04-03 DIAGNOSIS — N186 End stage renal disease: Secondary | ICD-10-CM | POA: Diagnosis not present

## 2018-04-03 DIAGNOSIS — D509 Iron deficiency anemia, unspecified: Secondary | ICD-10-CM | POA: Diagnosis not present

## 2018-04-03 DIAGNOSIS — N2581 Secondary hyperparathyroidism of renal origin: Secondary | ICD-10-CM | POA: Diagnosis not present

## 2018-04-05 DIAGNOSIS — N2581 Secondary hyperparathyroidism of renal origin: Secondary | ICD-10-CM | POA: Diagnosis not present

## 2018-04-05 DIAGNOSIS — D509 Iron deficiency anemia, unspecified: Secondary | ICD-10-CM | POA: Diagnosis not present

## 2018-04-05 DIAGNOSIS — N186 End stage renal disease: Secondary | ICD-10-CM | POA: Diagnosis not present

## 2018-04-05 DIAGNOSIS — E119 Type 2 diabetes mellitus without complications: Secondary | ICD-10-CM | POA: Diagnosis not present

## 2018-04-07 DIAGNOSIS — N2581 Secondary hyperparathyroidism of renal origin: Secondary | ICD-10-CM | POA: Diagnosis not present

## 2018-04-07 DIAGNOSIS — E119 Type 2 diabetes mellitus without complications: Secondary | ICD-10-CM | POA: Diagnosis not present

## 2018-04-07 DIAGNOSIS — N186 End stage renal disease: Secondary | ICD-10-CM | POA: Diagnosis not present

## 2018-04-07 DIAGNOSIS — D509 Iron deficiency anemia, unspecified: Secondary | ICD-10-CM | POA: Diagnosis not present

## 2018-04-10 DIAGNOSIS — E119 Type 2 diabetes mellitus without complications: Secondary | ICD-10-CM | POA: Diagnosis not present

## 2018-04-10 DIAGNOSIS — N186 End stage renal disease: Secondary | ICD-10-CM | POA: Diagnosis not present

## 2018-04-10 DIAGNOSIS — N2581 Secondary hyperparathyroidism of renal origin: Secondary | ICD-10-CM | POA: Diagnosis not present

## 2018-04-10 DIAGNOSIS — D509 Iron deficiency anemia, unspecified: Secondary | ICD-10-CM | POA: Diagnosis not present

## 2018-04-12 DIAGNOSIS — D509 Iron deficiency anemia, unspecified: Secondary | ICD-10-CM | POA: Diagnosis not present

## 2018-04-12 DIAGNOSIS — N186 End stage renal disease: Secondary | ICD-10-CM | POA: Diagnosis not present

## 2018-04-12 DIAGNOSIS — N2581 Secondary hyperparathyroidism of renal origin: Secondary | ICD-10-CM | POA: Diagnosis not present

## 2018-04-12 DIAGNOSIS — E119 Type 2 diabetes mellitus without complications: Secondary | ICD-10-CM | POA: Diagnosis not present

## 2018-04-14 DIAGNOSIS — N186 End stage renal disease: Secondary | ICD-10-CM | POA: Diagnosis not present

## 2018-04-14 DIAGNOSIS — E119 Type 2 diabetes mellitus without complications: Secondary | ICD-10-CM | POA: Diagnosis not present

## 2018-04-14 DIAGNOSIS — N2581 Secondary hyperparathyroidism of renal origin: Secondary | ICD-10-CM | POA: Diagnosis not present

## 2018-04-14 DIAGNOSIS — D509 Iron deficiency anemia, unspecified: Secondary | ICD-10-CM | POA: Diagnosis not present

## 2018-04-15 DIAGNOSIS — Z992 Dependence on renal dialysis: Secondary | ICD-10-CM | POA: Diagnosis not present

## 2018-04-15 DIAGNOSIS — N186 End stage renal disease: Secondary | ICD-10-CM | POA: Diagnosis not present

## 2018-04-15 DIAGNOSIS — E1129 Type 2 diabetes mellitus with other diabetic kidney complication: Secondary | ICD-10-CM | POA: Diagnosis not present

## 2018-04-17 DIAGNOSIS — E119 Type 2 diabetes mellitus without complications: Secondary | ICD-10-CM | POA: Diagnosis not present

## 2018-04-17 DIAGNOSIS — N186 End stage renal disease: Secondary | ICD-10-CM | POA: Diagnosis not present

## 2018-04-17 DIAGNOSIS — N2581 Secondary hyperparathyroidism of renal origin: Secondary | ICD-10-CM | POA: Diagnosis not present

## 2018-04-18 ENCOUNTER — Ambulatory Visit: Payer: Medicare Other

## 2018-04-18 DIAGNOSIS — I771 Stricture of artery: Secondary | ICD-10-CM | POA: Diagnosis not present

## 2018-04-18 DIAGNOSIS — Z992 Dependence on renal dialysis: Secondary | ICD-10-CM | POA: Diagnosis not present

## 2018-04-18 DIAGNOSIS — T82898A Other specified complication of vascular prosthetic devices, implants and grafts, initial encounter: Secondary | ICD-10-CM | POA: Diagnosis not present

## 2018-04-18 DIAGNOSIS — N186 End stage renal disease: Secondary | ICD-10-CM | POA: Diagnosis not present

## 2018-04-18 DIAGNOSIS — T82858A Stenosis of vascular prosthetic devices, implants and grafts, initial encounter: Secondary | ICD-10-CM | POA: Diagnosis not present

## 2018-04-19 DIAGNOSIS — N186 End stage renal disease: Secondary | ICD-10-CM | POA: Diagnosis not present

## 2018-04-19 DIAGNOSIS — E119 Type 2 diabetes mellitus without complications: Secondary | ICD-10-CM | POA: Diagnosis not present

## 2018-04-19 DIAGNOSIS — N2581 Secondary hyperparathyroidism of renal origin: Secondary | ICD-10-CM | POA: Diagnosis not present

## 2018-04-21 DIAGNOSIS — E119 Type 2 diabetes mellitus without complications: Secondary | ICD-10-CM | POA: Diagnosis not present

## 2018-04-21 DIAGNOSIS — N2581 Secondary hyperparathyroidism of renal origin: Secondary | ICD-10-CM | POA: Diagnosis not present

## 2018-04-21 DIAGNOSIS — N186 End stage renal disease: Secondary | ICD-10-CM | POA: Diagnosis not present

## 2018-04-24 DIAGNOSIS — N2581 Secondary hyperparathyroidism of renal origin: Secondary | ICD-10-CM | POA: Diagnosis not present

## 2018-04-24 DIAGNOSIS — N186 End stage renal disease: Secondary | ICD-10-CM | POA: Diagnosis not present

## 2018-04-24 DIAGNOSIS — E119 Type 2 diabetes mellitus without complications: Secondary | ICD-10-CM | POA: Diagnosis not present

## 2018-04-26 DIAGNOSIS — E119 Type 2 diabetes mellitus without complications: Secondary | ICD-10-CM | POA: Diagnosis not present

## 2018-04-26 DIAGNOSIS — N2581 Secondary hyperparathyroidism of renal origin: Secondary | ICD-10-CM | POA: Diagnosis not present

## 2018-04-26 DIAGNOSIS — N186 End stage renal disease: Secondary | ICD-10-CM | POA: Diagnosis not present

## 2018-04-28 DIAGNOSIS — N2581 Secondary hyperparathyroidism of renal origin: Secondary | ICD-10-CM | POA: Diagnosis not present

## 2018-04-28 DIAGNOSIS — E119 Type 2 diabetes mellitus without complications: Secondary | ICD-10-CM | POA: Diagnosis not present

## 2018-04-28 DIAGNOSIS — N186 End stage renal disease: Secondary | ICD-10-CM | POA: Diagnosis not present

## 2018-05-01 DIAGNOSIS — N2581 Secondary hyperparathyroidism of renal origin: Secondary | ICD-10-CM | POA: Diagnosis not present

## 2018-05-01 DIAGNOSIS — E119 Type 2 diabetes mellitus without complications: Secondary | ICD-10-CM | POA: Diagnosis not present

## 2018-05-01 DIAGNOSIS — N186 End stage renal disease: Secondary | ICD-10-CM | POA: Diagnosis not present

## 2018-05-03 DIAGNOSIS — N2581 Secondary hyperparathyroidism of renal origin: Secondary | ICD-10-CM | POA: Diagnosis not present

## 2018-05-03 DIAGNOSIS — E119 Type 2 diabetes mellitus without complications: Secondary | ICD-10-CM | POA: Diagnosis not present

## 2018-05-03 DIAGNOSIS — N186 End stage renal disease: Secondary | ICD-10-CM | POA: Diagnosis not present

## 2018-05-05 DIAGNOSIS — N186 End stage renal disease: Secondary | ICD-10-CM | POA: Diagnosis not present

## 2018-05-05 DIAGNOSIS — E119 Type 2 diabetes mellitus without complications: Secondary | ICD-10-CM | POA: Diagnosis not present

## 2018-05-05 DIAGNOSIS — N2581 Secondary hyperparathyroidism of renal origin: Secondary | ICD-10-CM | POA: Diagnosis not present

## 2018-05-08 DIAGNOSIS — N2581 Secondary hyperparathyroidism of renal origin: Secondary | ICD-10-CM | POA: Diagnosis not present

## 2018-05-08 DIAGNOSIS — N186 End stage renal disease: Secondary | ICD-10-CM | POA: Diagnosis not present

## 2018-05-08 DIAGNOSIS — E119 Type 2 diabetes mellitus without complications: Secondary | ICD-10-CM | POA: Diagnosis not present

## 2018-05-10 DIAGNOSIS — N2581 Secondary hyperparathyroidism of renal origin: Secondary | ICD-10-CM | POA: Diagnosis not present

## 2018-05-10 DIAGNOSIS — N186 End stage renal disease: Secondary | ICD-10-CM | POA: Diagnosis not present

## 2018-05-10 DIAGNOSIS — E119 Type 2 diabetes mellitus without complications: Secondary | ICD-10-CM | POA: Diagnosis not present

## 2018-05-12 DIAGNOSIS — E119 Type 2 diabetes mellitus without complications: Secondary | ICD-10-CM | POA: Diagnosis not present

## 2018-05-12 DIAGNOSIS — N2581 Secondary hyperparathyroidism of renal origin: Secondary | ICD-10-CM | POA: Diagnosis not present

## 2018-05-12 DIAGNOSIS — N186 End stage renal disease: Secondary | ICD-10-CM | POA: Diagnosis not present

## 2018-05-15 DIAGNOSIS — N186 End stage renal disease: Secondary | ICD-10-CM | POA: Diagnosis not present

## 2018-05-15 DIAGNOSIS — Z992 Dependence on renal dialysis: Secondary | ICD-10-CM | POA: Diagnosis not present

## 2018-05-15 DIAGNOSIS — E119 Type 2 diabetes mellitus without complications: Secondary | ICD-10-CM | POA: Diagnosis not present

## 2018-05-15 DIAGNOSIS — N2581 Secondary hyperparathyroidism of renal origin: Secondary | ICD-10-CM | POA: Diagnosis not present

## 2018-05-15 DIAGNOSIS — E1129 Type 2 diabetes mellitus with other diabetic kidney complication: Secondary | ICD-10-CM | POA: Diagnosis not present

## 2018-05-16 ENCOUNTER — Other Ambulatory Visit: Payer: Medicare Other | Admitting: Orthotics

## 2018-05-16 ENCOUNTER — Ambulatory Visit (INDEPENDENT_AMBULATORY_CARE_PROVIDER_SITE_OTHER): Payer: Medicare Other | Admitting: Orthotics

## 2018-05-16 DIAGNOSIS — L84 Corns and callosities: Secondary | ICD-10-CM | POA: Diagnosis not present

## 2018-05-16 DIAGNOSIS — E11628 Type 2 diabetes mellitus with other skin complications: Secondary | ICD-10-CM

## 2018-05-16 DIAGNOSIS — E1142 Type 2 diabetes mellitus with diabetic polyneuropathy: Secondary | ICD-10-CM | POA: Diagnosis not present

## 2018-05-16 DIAGNOSIS — M2040 Other hammer toe(s) (acquired), unspecified foot: Secondary | ICD-10-CM

## 2018-05-17 DIAGNOSIS — N186 End stage renal disease: Secondary | ICD-10-CM | POA: Diagnosis not present

## 2018-05-17 DIAGNOSIS — N2581 Secondary hyperparathyroidism of renal origin: Secondary | ICD-10-CM | POA: Diagnosis not present

## 2018-05-17 DIAGNOSIS — E119 Type 2 diabetes mellitus without complications: Secondary | ICD-10-CM | POA: Diagnosis not present

## 2018-05-19 DIAGNOSIS — E119 Type 2 diabetes mellitus without complications: Secondary | ICD-10-CM | POA: Diagnosis not present

## 2018-05-19 DIAGNOSIS — N2581 Secondary hyperparathyroidism of renal origin: Secondary | ICD-10-CM | POA: Diagnosis not present

## 2018-05-19 DIAGNOSIS — N186 End stage renal disease: Secondary | ICD-10-CM | POA: Diagnosis not present

## 2018-05-22 DIAGNOSIS — N2581 Secondary hyperparathyroidism of renal origin: Secondary | ICD-10-CM | POA: Diagnosis not present

## 2018-05-22 DIAGNOSIS — N186 End stage renal disease: Secondary | ICD-10-CM | POA: Diagnosis not present

## 2018-05-22 DIAGNOSIS — E119 Type 2 diabetes mellitus without complications: Secondary | ICD-10-CM | POA: Diagnosis not present

## 2018-05-24 DIAGNOSIS — N186 End stage renal disease: Secondary | ICD-10-CM | POA: Diagnosis not present

## 2018-05-24 DIAGNOSIS — N2581 Secondary hyperparathyroidism of renal origin: Secondary | ICD-10-CM | POA: Diagnosis not present

## 2018-05-24 DIAGNOSIS — E119 Type 2 diabetes mellitus without complications: Secondary | ICD-10-CM | POA: Diagnosis not present

## 2018-05-25 ENCOUNTER — Ambulatory Visit: Payer: Medicare Other | Admitting: Orthotics

## 2018-05-25 ENCOUNTER — Other Ambulatory Visit: Payer: Medicare Other

## 2018-05-25 DIAGNOSIS — G4733 Obstructive sleep apnea (adult) (pediatric): Secondary | ICD-10-CM | POA: Diagnosis not present

## 2018-05-26 DIAGNOSIS — E119 Type 2 diabetes mellitus without complications: Secondary | ICD-10-CM | POA: Diagnosis not present

## 2018-05-26 DIAGNOSIS — N186 End stage renal disease: Secondary | ICD-10-CM | POA: Diagnosis not present

## 2018-05-26 DIAGNOSIS — N2581 Secondary hyperparathyroidism of renal origin: Secondary | ICD-10-CM | POA: Diagnosis not present

## 2018-05-26 NOTE — Progress Notes (Signed)

## 2018-05-29 DIAGNOSIS — N186 End stage renal disease: Secondary | ICD-10-CM | POA: Diagnosis not present

## 2018-05-29 DIAGNOSIS — N2581 Secondary hyperparathyroidism of renal origin: Secondary | ICD-10-CM | POA: Diagnosis not present

## 2018-05-29 DIAGNOSIS — E119 Type 2 diabetes mellitus without complications: Secondary | ICD-10-CM | POA: Diagnosis not present

## 2018-05-31 DIAGNOSIS — N186 End stage renal disease: Secondary | ICD-10-CM | POA: Diagnosis not present

## 2018-05-31 DIAGNOSIS — N2581 Secondary hyperparathyroidism of renal origin: Secondary | ICD-10-CM | POA: Diagnosis not present

## 2018-05-31 DIAGNOSIS — E119 Type 2 diabetes mellitus without complications: Secondary | ICD-10-CM | POA: Diagnosis not present

## 2018-06-02 DIAGNOSIS — N186 End stage renal disease: Secondary | ICD-10-CM | POA: Diagnosis not present

## 2018-06-02 DIAGNOSIS — N2581 Secondary hyperparathyroidism of renal origin: Secondary | ICD-10-CM | POA: Diagnosis not present

## 2018-06-02 DIAGNOSIS — E119 Type 2 diabetes mellitus without complications: Secondary | ICD-10-CM | POA: Diagnosis not present

## 2018-06-05 DIAGNOSIS — N2581 Secondary hyperparathyroidism of renal origin: Secondary | ICD-10-CM | POA: Diagnosis not present

## 2018-06-05 DIAGNOSIS — E119 Type 2 diabetes mellitus without complications: Secondary | ICD-10-CM | POA: Diagnosis not present

## 2018-06-05 DIAGNOSIS — N186 End stage renal disease: Secondary | ICD-10-CM | POA: Diagnosis not present

## 2018-06-07 DIAGNOSIS — E119 Type 2 diabetes mellitus without complications: Secondary | ICD-10-CM | POA: Diagnosis not present

## 2018-06-07 DIAGNOSIS — N2581 Secondary hyperparathyroidism of renal origin: Secondary | ICD-10-CM | POA: Diagnosis not present

## 2018-06-07 DIAGNOSIS — N186 End stage renal disease: Secondary | ICD-10-CM | POA: Diagnosis not present

## 2018-06-09 DIAGNOSIS — N186 End stage renal disease: Secondary | ICD-10-CM | POA: Diagnosis not present

## 2018-06-09 DIAGNOSIS — E119 Type 2 diabetes mellitus without complications: Secondary | ICD-10-CM | POA: Diagnosis not present

## 2018-06-09 DIAGNOSIS — N2581 Secondary hyperparathyroidism of renal origin: Secondary | ICD-10-CM | POA: Diagnosis not present

## 2018-06-12 DIAGNOSIS — E119 Type 2 diabetes mellitus without complications: Secondary | ICD-10-CM | POA: Diagnosis not present

## 2018-06-12 DIAGNOSIS — N2581 Secondary hyperparathyroidism of renal origin: Secondary | ICD-10-CM | POA: Diagnosis not present

## 2018-06-12 DIAGNOSIS — N186 End stage renal disease: Secondary | ICD-10-CM | POA: Diagnosis not present

## 2018-06-14 DIAGNOSIS — N2581 Secondary hyperparathyroidism of renal origin: Secondary | ICD-10-CM | POA: Diagnosis not present

## 2018-06-14 DIAGNOSIS — N186 End stage renal disease: Secondary | ICD-10-CM | POA: Diagnosis not present

## 2018-06-14 DIAGNOSIS — E119 Type 2 diabetes mellitus without complications: Secondary | ICD-10-CM | POA: Diagnosis not present

## 2018-06-15 DIAGNOSIS — E1129 Type 2 diabetes mellitus with other diabetic kidney complication: Secondary | ICD-10-CM | POA: Diagnosis not present

## 2018-06-15 DIAGNOSIS — Z992 Dependence on renal dialysis: Secondary | ICD-10-CM | POA: Diagnosis not present

## 2018-06-15 DIAGNOSIS — N186 End stage renal disease: Secondary | ICD-10-CM | POA: Diagnosis not present

## 2018-06-16 DIAGNOSIS — N2581 Secondary hyperparathyroidism of renal origin: Secondary | ICD-10-CM | POA: Diagnosis not present

## 2018-06-16 DIAGNOSIS — E119 Type 2 diabetes mellitus without complications: Secondary | ICD-10-CM | POA: Diagnosis not present

## 2018-06-16 DIAGNOSIS — N186 End stage renal disease: Secondary | ICD-10-CM | POA: Diagnosis not present

## 2018-06-19 DIAGNOSIS — N2581 Secondary hyperparathyroidism of renal origin: Secondary | ICD-10-CM | POA: Diagnosis not present

## 2018-06-19 DIAGNOSIS — N186 End stage renal disease: Secondary | ICD-10-CM | POA: Diagnosis not present

## 2018-06-19 DIAGNOSIS — E119 Type 2 diabetes mellitus without complications: Secondary | ICD-10-CM | POA: Diagnosis not present

## 2018-06-21 DIAGNOSIS — E119 Type 2 diabetes mellitus without complications: Secondary | ICD-10-CM | POA: Diagnosis not present

## 2018-06-21 DIAGNOSIS — N186 End stage renal disease: Secondary | ICD-10-CM | POA: Diagnosis not present

## 2018-06-21 DIAGNOSIS — N2581 Secondary hyperparathyroidism of renal origin: Secondary | ICD-10-CM | POA: Diagnosis not present

## 2018-06-22 ENCOUNTER — Ambulatory Visit: Payer: Medicare Other | Admitting: Podiatry

## 2018-06-23 DIAGNOSIS — N186 End stage renal disease: Secondary | ICD-10-CM | POA: Diagnosis not present

## 2018-06-23 DIAGNOSIS — E119 Type 2 diabetes mellitus without complications: Secondary | ICD-10-CM | POA: Diagnosis not present

## 2018-06-23 DIAGNOSIS — N2581 Secondary hyperparathyroidism of renal origin: Secondary | ICD-10-CM | POA: Diagnosis not present

## 2018-06-26 DIAGNOSIS — N186 End stage renal disease: Secondary | ICD-10-CM | POA: Diagnosis not present

## 2018-06-26 DIAGNOSIS — E119 Type 2 diabetes mellitus without complications: Secondary | ICD-10-CM | POA: Diagnosis not present

## 2018-06-26 DIAGNOSIS — N2581 Secondary hyperparathyroidism of renal origin: Secondary | ICD-10-CM | POA: Diagnosis not present

## 2018-06-28 DIAGNOSIS — E119 Type 2 diabetes mellitus without complications: Secondary | ICD-10-CM | POA: Diagnosis not present

## 2018-06-28 DIAGNOSIS — N186 End stage renal disease: Secondary | ICD-10-CM | POA: Diagnosis not present

## 2018-06-28 DIAGNOSIS — N2581 Secondary hyperparathyroidism of renal origin: Secondary | ICD-10-CM | POA: Diagnosis not present

## 2018-06-30 DIAGNOSIS — N2581 Secondary hyperparathyroidism of renal origin: Secondary | ICD-10-CM | POA: Diagnosis not present

## 2018-06-30 DIAGNOSIS — N186 End stage renal disease: Secondary | ICD-10-CM | POA: Diagnosis not present

## 2018-06-30 DIAGNOSIS — E119 Type 2 diabetes mellitus without complications: Secondary | ICD-10-CM | POA: Diagnosis not present

## 2018-07-03 DIAGNOSIS — E119 Type 2 diabetes mellitus without complications: Secondary | ICD-10-CM | POA: Diagnosis not present

## 2018-07-03 DIAGNOSIS — N186 End stage renal disease: Secondary | ICD-10-CM | POA: Diagnosis not present

## 2018-07-03 DIAGNOSIS — N2581 Secondary hyperparathyroidism of renal origin: Secondary | ICD-10-CM | POA: Diagnosis not present

## 2018-07-05 DIAGNOSIS — N186 End stage renal disease: Secondary | ICD-10-CM | POA: Diagnosis not present

## 2018-07-05 DIAGNOSIS — E119 Type 2 diabetes mellitus without complications: Secondary | ICD-10-CM | POA: Diagnosis not present

## 2018-07-05 DIAGNOSIS — N2581 Secondary hyperparathyroidism of renal origin: Secondary | ICD-10-CM | POA: Diagnosis not present

## 2018-07-07 DIAGNOSIS — N2581 Secondary hyperparathyroidism of renal origin: Secondary | ICD-10-CM | POA: Diagnosis not present

## 2018-07-07 DIAGNOSIS — N186 End stage renal disease: Secondary | ICD-10-CM | POA: Diagnosis not present

## 2018-07-07 DIAGNOSIS — E119 Type 2 diabetes mellitus without complications: Secondary | ICD-10-CM | POA: Diagnosis not present

## 2018-07-10 DIAGNOSIS — N186 End stage renal disease: Secondary | ICD-10-CM | POA: Diagnosis not present

## 2018-07-10 DIAGNOSIS — N2581 Secondary hyperparathyroidism of renal origin: Secondary | ICD-10-CM | POA: Diagnosis not present

## 2018-07-10 DIAGNOSIS — E119 Type 2 diabetes mellitus without complications: Secondary | ICD-10-CM | POA: Diagnosis not present

## 2018-07-12 DIAGNOSIS — E119 Type 2 diabetes mellitus without complications: Secondary | ICD-10-CM | POA: Diagnosis not present

## 2018-07-12 DIAGNOSIS — N2581 Secondary hyperparathyroidism of renal origin: Secondary | ICD-10-CM | POA: Diagnosis not present

## 2018-07-12 DIAGNOSIS — N186 End stage renal disease: Secondary | ICD-10-CM | POA: Diagnosis not present

## 2018-07-13 ENCOUNTER — Ambulatory Visit (INDEPENDENT_AMBULATORY_CARE_PROVIDER_SITE_OTHER): Payer: Medicare Other | Admitting: Podiatry

## 2018-07-13 ENCOUNTER — Encounter: Payer: Self-pay | Admitting: Podiatry

## 2018-07-13 DIAGNOSIS — M79675 Pain in left toe(s): Secondary | ICD-10-CM

## 2018-07-13 DIAGNOSIS — B351 Tinea unguium: Secondary | ICD-10-CM

## 2018-07-13 DIAGNOSIS — M79674 Pain in right toe(s): Secondary | ICD-10-CM | POA: Diagnosis not present

## 2018-07-13 DIAGNOSIS — D649 Anemia, unspecified: Secondary | ICD-10-CM | POA: Insufficient documentation

## 2018-07-13 DIAGNOSIS — M25469 Effusion, unspecified knee: Secondary | ICD-10-CM | POA: Insufficient documentation

## 2018-07-13 DIAGNOSIS — E1142 Type 2 diabetes mellitus with diabetic polyneuropathy: Secondary | ICD-10-CM

## 2018-07-13 DIAGNOSIS — I10 Essential (primary) hypertension: Secondary | ICD-10-CM | POA: Insufficient documentation

## 2018-07-13 NOTE — Progress Notes (Signed)
Subjective:   Patient ID: [name], male   DOB: 62 y.o.   MRN: [id number]   HPI Patient presents with nail disease 1-5 both feet that are thick yellow brittle and moderately painful F2 thick yellow brittle nailbeds that are possible for him to cut due to obesity diabetes with a painful component   ROS      Objective:  Physical Exam  Listed above     Assessment:  Mycotic nail infection with pain 1-5 both feet     Plan:  Debride painful nailbeds 1-5 both feet with no iatrogenic bleeding noted and also talked about his high A1c and he is promising the skin to work to get it down

## 2018-07-14 DIAGNOSIS — E119 Type 2 diabetes mellitus without complications: Secondary | ICD-10-CM | POA: Diagnosis not present

## 2018-07-14 DIAGNOSIS — N2581 Secondary hyperparathyroidism of renal origin: Secondary | ICD-10-CM | POA: Diagnosis not present

## 2018-07-14 DIAGNOSIS — N186 End stage renal disease: Secondary | ICD-10-CM | POA: Diagnosis not present

## 2018-07-16 DIAGNOSIS — E1129 Type 2 diabetes mellitus with other diabetic kidney complication: Secondary | ICD-10-CM | POA: Diagnosis not present

## 2018-07-16 DIAGNOSIS — N186 End stage renal disease: Secondary | ICD-10-CM | POA: Diagnosis not present

## 2018-07-16 DIAGNOSIS — Z992 Dependence on renal dialysis: Secondary | ICD-10-CM | POA: Diagnosis not present

## 2018-07-17 DIAGNOSIS — E119 Type 2 diabetes mellitus without complications: Secondary | ICD-10-CM | POA: Diagnosis not present

## 2018-07-17 DIAGNOSIS — N186 End stage renal disease: Secondary | ICD-10-CM | POA: Diagnosis not present

## 2018-07-17 DIAGNOSIS — Z23 Encounter for immunization: Secondary | ICD-10-CM | POA: Diagnosis not present

## 2018-07-17 DIAGNOSIS — N2581 Secondary hyperparathyroidism of renal origin: Secondary | ICD-10-CM | POA: Diagnosis not present

## 2018-07-17 DIAGNOSIS — D509 Iron deficiency anemia, unspecified: Secondary | ICD-10-CM | POA: Diagnosis not present

## 2018-07-19 DIAGNOSIS — D509 Iron deficiency anemia, unspecified: Secondary | ICD-10-CM | POA: Diagnosis not present

## 2018-07-19 DIAGNOSIS — Z23 Encounter for immunization: Secondary | ICD-10-CM | POA: Diagnosis not present

## 2018-07-19 DIAGNOSIS — N186 End stage renal disease: Secondary | ICD-10-CM | POA: Diagnosis not present

## 2018-07-19 DIAGNOSIS — E119 Type 2 diabetes mellitus without complications: Secondary | ICD-10-CM | POA: Diagnosis not present

## 2018-07-19 DIAGNOSIS — N2581 Secondary hyperparathyroidism of renal origin: Secondary | ICD-10-CM | POA: Diagnosis not present

## 2018-07-20 DIAGNOSIS — E1165 Type 2 diabetes mellitus with hyperglycemia: Secondary | ICD-10-CM | POA: Diagnosis not present

## 2018-07-20 DIAGNOSIS — E7800 Pure hypercholesterolemia, unspecified: Secondary | ICD-10-CM | POA: Diagnosis not present

## 2018-07-20 DIAGNOSIS — N186 End stage renal disease: Secondary | ICD-10-CM | POA: Diagnosis not present

## 2018-07-20 DIAGNOSIS — I10 Essential (primary) hypertension: Secondary | ICD-10-CM | POA: Diagnosis not present

## 2018-07-20 DIAGNOSIS — E1140 Type 2 diabetes mellitus with diabetic neuropathy, unspecified: Secondary | ICD-10-CM | POA: Diagnosis not present

## 2018-07-21 DIAGNOSIS — D509 Iron deficiency anemia, unspecified: Secondary | ICD-10-CM | POA: Diagnosis not present

## 2018-07-21 DIAGNOSIS — E119 Type 2 diabetes mellitus without complications: Secondary | ICD-10-CM | POA: Diagnosis not present

## 2018-07-21 DIAGNOSIS — N186 End stage renal disease: Secondary | ICD-10-CM | POA: Diagnosis not present

## 2018-07-21 DIAGNOSIS — N2581 Secondary hyperparathyroidism of renal origin: Secondary | ICD-10-CM | POA: Diagnosis not present

## 2018-07-21 DIAGNOSIS — Z23 Encounter for immunization: Secondary | ICD-10-CM | POA: Diagnosis not present

## 2018-07-24 DIAGNOSIS — N186 End stage renal disease: Secondary | ICD-10-CM | POA: Diagnosis not present

## 2018-07-24 DIAGNOSIS — D509 Iron deficiency anemia, unspecified: Secondary | ICD-10-CM | POA: Diagnosis not present

## 2018-07-24 DIAGNOSIS — N2581 Secondary hyperparathyroidism of renal origin: Secondary | ICD-10-CM | POA: Diagnosis not present

## 2018-07-24 DIAGNOSIS — E119 Type 2 diabetes mellitus without complications: Secondary | ICD-10-CM | POA: Diagnosis not present

## 2018-07-24 DIAGNOSIS — Z23 Encounter for immunization: Secondary | ICD-10-CM | POA: Diagnosis not present

## 2018-07-26 DIAGNOSIS — N2581 Secondary hyperparathyroidism of renal origin: Secondary | ICD-10-CM | POA: Diagnosis not present

## 2018-07-26 DIAGNOSIS — D509 Iron deficiency anemia, unspecified: Secondary | ICD-10-CM | POA: Diagnosis not present

## 2018-07-26 DIAGNOSIS — E119 Type 2 diabetes mellitus without complications: Secondary | ICD-10-CM | POA: Diagnosis not present

## 2018-07-26 DIAGNOSIS — Z23 Encounter for immunization: Secondary | ICD-10-CM | POA: Diagnosis not present

## 2018-07-26 DIAGNOSIS — N186 End stage renal disease: Secondary | ICD-10-CM | POA: Diagnosis not present

## 2018-07-28 DIAGNOSIS — N186 End stage renal disease: Secondary | ICD-10-CM | POA: Diagnosis not present

## 2018-07-28 DIAGNOSIS — N2581 Secondary hyperparathyroidism of renal origin: Secondary | ICD-10-CM | POA: Diagnosis not present

## 2018-07-28 DIAGNOSIS — E119 Type 2 diabetes mellitus without complications: Secondary | ICD-10-CM | POA: Diagnosis not present

## 2018-07-28 DIAGNOSIS — Z23 Encounter for immunization: Secondary | ICD-10-CM | POA: Diagnosis not present

## 2018-07-28 DIAGNOSIS — D509 Iron deficiency anemia, unspecified: Secondary | ICD-10-CM | POA: Diagnosis not present

## 2018-07-31 DIAGNOSIS — D509 Iron deficiency anemia, unspecified: Secondary | ICD-10-CM | POA: Diagnosis not present

## 2018-07-31 DIAGNOSIS — N186 End stage renal disease: Secondary | ICD-10-CM | POA: Diagnosis not present

## 2018-07-31 DIAGNOSIS — E119 Type 2 diabetes mellitus without complications: Secondary | ICD-10-CM | POA: Diagnosis not present

## 2018-07-31 DIAGNOSIS — N2581 Secondary hyperparathyroidism of renal origin: Secondary | ICD-10-CM | POA: Diagnosis not present

## 2018-07-31 DIAGNOSIS — Z23 Encounter for immunization: Secondary | ICD-10-CM | POA: Diagnosis not present

## 2018-08-02 DIAGNOSIS — E119 Type 2 diabetes mellitus without complications: Secondary | ICD-10-CM | POA: Diagnosis not present

## 2018-08-02 DIAGNOSIS — D509 Iron deficiency anemia, unspecified: Secondary | ICD-10-CM | POA: Diagnosis not present

## 2018-08-02 DIAGNOSIS — N186 End stage renal disease: Secondary | ICD-10-CM | POA: Diagnosis not present

## 2018-08-02 DIAGNOSIS — Z23 Encounter for immunization: Secondary | ICD-10-CM | POA: Diagnosis not present

## 2018-08-02 DIAGNOSIS — N2581 Secondary hyperparathyroidism of renal origin: Secondary | ICD-10-CM | POA: Diagnosis not present

## 2018-08-03 ENCOUNTER — Other Ambulatory Visit: Payer: Self-pay | Admitting: *Deleted

## 2018-08-03 ENCOUNTER — Other Ambulatory Visit: Payer: Self-pay

## 2018-08-03 ENCOUNTER — Encounter: Payer: Self-pay | Admitting: Family

## 2018-08-03 ENCOUNTER — Encounter: Payer: Self-pay | Admitting: *Deleted

## 2018-08-03 ENCOUNTER — Ambulatory Visit (INDEPENDENT_AMBULATORY_CARE_PROVIDER_SITE_OTHER): Payer: Medicare Other | Admitting: Family

## 2018-08-03 VITALS — BP 122/68 | HR 89 | Temp 98.2°F | Resp 21 | Ht 72.0 in | Wt 330.6 lb

## 2018-08-03 DIAGNOSIS — Z992 Dependence on renal dialysis: Secondary | ICD-10-CM | POA: Diagnosis not present

## 2018-08-03 DIAGNOSIS — T82590D Other mechanical complication of surgically created arteriovenous fistula, subsequent encounter: Secondary | ICD-10-CM

## 2018-08-03 DIAGNOSIS — N186 End stage renal disease: Secondary | ICD-10-CM

## 2018-08-03 NOTE — H&P (View-Only) (Signed)
CC: non bleeding ulceration at aneurysmal degeneration of right forearm AV fistula  History of Present Illness  [name] is a 62 y.o. (25-Feb-1956) male who is s/p plication of right radial cephalic AVF aneurysm and resection of ulcer on right radial cephalic AVF by Dr. Trula Slade on 01/19/17.  He presented on 02-03-17 with a superficial dehiscence of his incision.  Wet to dry daily saline dressing changes were started.  The wound was improving and getting smaller.  He had some tingling in his fingers, no motor changes.  He then underwent exploration and removal of chronic thrombus near the arterial anastomosis on 07-04-17 by Dr. Donnetta Hutching for partially thrombosed right arm AV fistula.   He was last evaluated on 02-14-17 by S. Rhyne PA-C. At that time he was doing well and wound was clean and healing. Pt was doing dressing changes once a day; pt was advised to change this to twice a day since the gauze is dry when he removes it.   He inquired about swimming at the [facility] for exercise.  He is not to swim until that wound had completely healed. He was advised to f/u in 2 weeks with the PA on a day that Dr. Trula Slade was in the office for a wound check.  He has not returned until today. He returns at the request of his kidney center and Dr. Joelyn Oms for evaluation of enlarged scab on his right forearm AVF. Pt states he first noticed this several weeks ago. He denies any bleeding from this. He does water aerobics and the scab becomes soft.   He dialyzes M-W-F via left forearm AV fistua at [facility] on [address], [place]. He has tingling and numbness in his right hand during HD. He had carpal tunnel surgery in the right wrist, apparently the thinking was that this would help, but pt states the tingling and numbness has been worse since this.   He is under evaluation for kidney transplant at [facility].   He states that he uses a humidified C-PAP, but has to remove the mask at times due to  his mouth drying out.    Past Medical History:  Diagnosis Date  . Alcohol abuse   . Congestive heart disease ([place]) 12/2011  . Diabetes mellitus   . GERD (gastroesophageal reflux disease)   . Gout   . Headache   . Hepatitis    being treated for Hepatitis C  . Hyperlipidemia   . Hyperlipidemia   . Hypertension   . Monoclonal gammopathies 02/25/2012  . Obesity   . Pedal edema   . Pneumonia   . Renal insufficiency   . Sleep apnea    uses cpap  . Tobacco abuse     Social History Social History   Tobacco Use  . Smoking status: Former Smoker    Packs/day: 0.30    Years: 33.00    Pack years: 9.90    Types: Cigarettes    Last attempt to quit: 07/16/2010    Years since quitting: 8.0  . Smokeless tobacco: Never Used  Substance Use Topics  . Alcohol use: No  . Drug use: No    Family History Family History  Problem Relation Age of Onset  . Diabetes Mother   . Heart disease Mother   . Lung cancer Father     Surgical History Past Surgical History:  Procedure Laterality Date  . AV FISTULA PLACEMENT Right 01/10/2014   Procedure: ARTERIOVENOUS (AV) FISTULA CREATION- RIGHT  RADIOCEPHALIC ;  Surgeon: Angelia Mould, MD;  Location: [place];  Service: Vascular;  Laterality: Right;  . COLONOSCOPY    . EYE SURGERY     cataract surgery  . Ganglion cyst removal x 2    . INSERTION OF DIALYSIS CATHETER N/A 01/07/2014   Procedure: INSERTION OF DIALYSIS CATHETER;  Surgeon: Conrad Eagarville, MD;  Location: [place];  Service: Vascular;  Laterality: N/A;  . Left knee arthroscopy with generalized joint synovectomy,  01/2006  . LIGATION OF COMPETING BRANCHES OF ARTERIOVENOUS FISTULA Right 05/28/2014   Procedure: LIGATION OF COMPETING BRANCHES OF ARTERIOVENOUS FISTULA;  Surgeon: Angelia Mould, MD;  Location: [place];  Service: Vascular;  Laterality: Right;  . REVISON OF ARTERIOVENOUS FISTULA Right 04/17/1307   Procedure: PLICATION REPAIR OF ARTERIOVENOUS FISTULA PSEUDOANEURYSM;  Surgeon:  Serafina Mitchell, MD;  Location: [place];  Service: Vascular;  Laterality: Right;  . SCROTAL EXPLORATION N/A 03/11/2017   Procedure: SCROTUM EXPLORATION AND DEBRIDEMENT;  Surgeon: Franchot Gallo, MD;  Location: [place];  Service: Urology;  Laterality: N/A;  . SCROTAL EXPLORATION N/A 03/21/2017   Procedure: REPEAT SCROTAL DEBRIDEMENT;  Surgeon: Franchot Gallo, MD;  Location: [place];  Service: Urology;  Laterality: N/A;  . SHUNTOGRAM Right 05/14/2014   Procedure: FISTULOGRAM;  Surgeon: Serafina Mitchell, MD;  Location: [facility] CATH LAB;  Service: Cardiovascular;  Laterality: Right;  . THROMBECTOMY W/ EMBOLECTOMY Right 07/04/2017   Procedure: EXPLORATION AND THROMBECTOMY ARTERIOVENOUS FISTULA RIGHT ARM;  Surgeon: Rosetta Posner, MD;  Location: [place];  Service: Vascular;  Laterality: Right;    Allergies  Allergen Reactions  . Penicillins Nausea And Vomiting and Other (See Comments)    Has patient had a PCN reaction causing immediate rash, facial/tongue/throat swelling, SOB or lightheadedness with hypotension: No Has patient had a PCN reaction causing severe rash involving mucus membranes or skin necrosis: No Has patient had a PCN reaction that required hospitalization No Has patient had a PCN reaction occurring within the last 10 years: No If all of the above answers are "NO", then may proceed with Cephalosporin use.  . Vioxx [Rofecoxib] Nausea And Vomiting    Current Outpatient Medications  Medication Sig Dispense Refill  . acetaminophen (TYLENOL) 500 MG tablet Take 1,000 mg by mouth every 6 (six) hours as needed for mild pain, moderate pain, fever or headache.     . allopurinol (ZYLOPRIM) 100 MG tablet Take 1 tablet (100 mg total) by mouth daily. (Patient taking differently: Take 200 mg by mouth daily. ) 30 tablet 2  . amLODipine (NORVASC) 5 MG tablet Take 5 mg by mouth daily.    . calcium acetate (PHOSLO) 667 MG capsule Take 2,668 mg by mouth 3 (three) times daily with meals.    . carvedilol (COREG)  6.25 MG tablet Take 6.25 mg by mouth 2 (two) times daily with a meal.    . ceFAZolin (ANCEF) 2-4 GM/100ML-% IVPB Inject 100 mLs (2 g total) into the vein every Monday, Wednesday, and Friday. For 2 more weeks 1 each   . cinacalcet (SENSIPAR) 60 MG tablet Take 60 mg by mouth daily.    . furosemide (LASIX) 40 MG tablet Take 40 mg by mouth daily.    Marland Kitchen gabapentin (NEURONTIN) 100 MG capsule Take 100 mg by mouth 3 (three) times daily.     Marland Kitchen HYDROcodone-acetaminophen (NORCO/VICODIN) 5-325 MG tablet Take 1 tablet by mouth every 6 (six) hours as needed for severe pain. 20 tablet 0  . Insulin Detemir (LEVEMIR FLEXTOUCH)  100 UNIT/ML Pen Inject 30 Units into the skin 2 (two) times daily.     Marland Kitchen NOVOLOG FLEXPEN 100 UNIT/ML FlexPen Inject 10 Units into the skin 3 (three) times daily with meals. (Patient taking differently: Inject 15 Units into the skin 2 (two) times daily. ) 15 mL 6  . ondansetron (ZOFRAN) 4 MG tablet Take 1 tablet (4 mg total) by mouth every 6 (six) hours as needed for nausea. 20 tablet 0  . pravastatin (PRAVACHOL) 20 MG tablet Take 20 mg by mouth daily.     Marland Kitchen oxyCODONE-acetaminophen (PERCOCET/ROXICET) 5-325 MG tablet Take 1 tablet by mouth every 6 (six) hours as needed. (Patient not taking: Reported on 08/03/2018) 6 tablet 0   No current facility-administered medications for this visit.      REVIEW OF SYSTEMS: see HPI for pertinent positives and negatives    PHYSICAL EXAMINATION:  Vitals:   08/03/18 1013  BP: 122/68  Pulse: 89  Resp: (!) 21  Temp: 98.2 F (36.8 C)  TempSrc: Oral  SpO2: 96%  Weight: (!) 330 lb 9.6 oz (150 kg)  Height: 6' (1.829 m)   Body mass index is 44.84 kg/m.  General: Morbidly obese male.   HEENT:  No gross abnormalities Pulmonary: Respirations are non-labored Abdomen: Soft and non-tender with. Musculoskeletal: There are no major deformities.   Neurologic: No focal weakness or paresthesias are detected, Skin: There are no ulcer or rashes  noted. Psychiatric: The patient has normal affect. Cardiovascular: There is a regular rate and rhythm without significant murmur appreciated.  Right AVF thrill is faintly palpable, bruit is faintly audible. Two aneurysmal degenerative changes of right forearm AV fistula, the distal aneurysm has a scab, no bleeding. See photo below.    Right forearm AVF, ulcer at one of the two small aneurysms     Medical Decision Making  DEVIAN BARTOLOMEI is a 62 y.o. male who is s/p plication of right radial cephalic AVF aneurysm and resection of ulcer on right radial cephalic AVF by Dr. Trula Slade on 01/19/17.  He then underwent exploration and removal of chronic thrombus near the arterial anastomosis on 07-04-17 by Dr. Donnetta Hutching for partially thrombosed right arm AV fistula.   He returns with an ulceration on the distal aneurysmal degeneration of his right forearm AV fistula; this has not had any bleeding.   Dr. Oneida Alar spoke with and examined pt. Will schedule for plication right arm AVF ulcer and aneurysm to try to salvage the AVF, by Dr. Scot Dock next Tuesday, 08-08-18 (a non HD day). Pt stated he did not want to abandon this AVF and create a new access unless this AVF is not salvageable. He is undergoing evaluation for renal transplant.     Nicholas Chambers, RN, MSN, FNP-C [facility] Office: [phone]  08/03/2018, 10:56 AM  Clinic MD: Oneida Alar

## 2018-08-03 NOTE — Progress Notes (Signed)
CC: non bleeding ulceration at aneurysmal degeneration of right forearm AV fistula  History of Present Illness  [name] is a 62 y.o. (10-30-1956) male who is s/p plication of right radial cephalic AVF aneurysm and resection of ulcer on right radial cephalic AVF by Dr. Trula Slade on 01/19/17.  He presented on 02-03-17 with a superficial dehiscence of his incision.  Wet to dry daily saline dressing changes were started.  The wound was improving and getting smaller.  He had some tingling in his fingers, no motor changes.  He then underwent exploration and removal of chronic thrombus near the arterial anastomosis on 07-04-17 by Dr. Donnetta Hutching for partially thrombosed right arm AV fistula.   He was last evaluated on 02-14-17 by S. Rhyne PA-C. At that time he was doing well and wound was clean and healing. Pt was doing dressing changes once a day; pt was advised to change this to twice a day since the gauze is dry when he removes it.   He inquired about swimming at the [facility] for exercise.  He is not to swim until that wound had completely healed. He was advised to f/u in 2 weeks with the PA on a day that Dr. Trula Slade was in the office for a wound check.  He has not returned until today. He returns at the request of his kidney center and Dr. Joelyn Oms for evaluation of enlarged scab on his right forearm AVF. Pt states he first noticed this several weeks ago. He denies any bleeding from this. He does water aerobics and the scab becomes soft.   He dialyzes M-W-F via left forearm AV fistua at [facility] on [address], [place]. He has tingling and numbness in his right hand during HD. He had carpal tunnel surgery in the right wrist, apparently the thinking was that this would help, but pt states the tingling and numbness has been worse since this.   He is under evaluation for kidney transplant at [facility].   He states that he uses a humidified C-PAP, but has to remove the mask at times due to  his mouth drying out.    Past Medical History:  Diagnosis Date  . Alcohol abuse   . Congestive heart disease ([place]) 12/2011  . Diabetes mellitus   . GERD (gastroesophageal reflux disease)   . Gout   . Headache   . Hepatitis    being treated for Hepatitis C  . Hyperlipidemia   . Hyperlipidemia   . Hypertension   . Monoclonal gammopathies 02/25/2012  . Obesity   . Pedal edema   . Pneumonia   . Renal insufficiency   . Sleep apnea    uses cpap  . Tobacco abuse     Social History Social History   Tobacco Use  . Smoking status: Former Smoker    Packs/day: 0.30    Years: 33.00    Pack years: 9.90    Types: Cigarettes    Last attempt to quit: 07/16/2010    Years since quitting: 8.0  . Smokeless tobacco: Never Used  Substance Use Topics  . Alcohol use: No  . Drug use: No    Family History Family History  Problem Relation Age of Onset  . Diabetes Mother   . Heart disease Mother   . Lung cancer Father     Surgical History Past Surgical History:  Procedure Laterality Date  . AV FISTULA PLACEMENT Right 01/10/2014   Procedure: ARTERIOVENOUS (AV) FISTULA CREATION- RIGHT  RADIOCEPHALIC ;  Surgeon: Angelia Mould, MD;  Location: [place];  Service: Vascular;  Laterality: Right;  . COLONOSCOPY    . EYE SURGERY     cataract surgery  . Ganglion cyst removal x 2    . INSERTION OF DIALYSIS CATHETER N/A 01/07/2014   Procedure: INSERTION OF DIALYSIS CATHETER;  Surgeon: Conrad [place], MD;  Location: [place];  Service: Vascular;  Laterality: N/A;  . Left knee arthroscopy with generalized joint synovectomy,  01/2006  . LIGATION OF COMPETING BRANCHES OF ARTERIOVENOUS FISTULA Right 05/28/2014   Procedure: LIGATION OF COMPETING BRANCHES OF ARTERIOVENOUS FISTULA;  Surgeon: Angelia Mould, MD;  Location: [place];  Service: Vascular;  Laterality: Right;  . REVISON OF ARTERIOVENOUS FISTULA Right 7/0/3500   Procedure: PLICATION REPAIR OF ARTERIOVENOUS FISTULA PSEUDOANEURYSM;  Surgeon:  Serafina Mitchell, MD;  Location: [place];  Service: Vascular;  Laterality: Right;  . SCROTAL EXPLORATION N/A 03/11/2017   Procedure: SCROTUM EXPLORATION AND DEBRIDEMENT;  Surgeon: Franchot Gallo, MD;  Location: [place];  Service: Urology;  Laterality: N/A;  . SCROTAL EXPLORATION N/A 03/21/2017   Procedure: REPEAT SCROTAL DEBRIDEMENT;  Surgeon: Franchot Gallo, MD;  Location: [place];  Service: Urology;  Laterality: N/A;  . SHUNTOGRAM Right 05/14/2014   Procedure: FISTULOGRAM;  Surgeon: Serafina Mitchell, MD;  Location: [facility] CATH LAB;  Service: Cardiovascular;  Laterality: Right;  . THROMBECTOMY W/ EMBOLECTOMY Right 07/04/2017   Procedure: EXPLORATION AND THROMBECTOMY ARTERIOVENOUS FISTULA RIGHT ARM;  Surgeon: Rosetta Posner, MD;  Location: [place];  Service: Vascular;  Laterality: Right;    Allergies  Allergen Reactions  . Penicillins Nausea And Vomiting and Other (See Comments)    Has patient had a PCN reaction causing immediate rash, facial/tongue/throat swelling, SOB or lightheadedness with hypotension: No Has patient had a PCN reaction causing severe rash involving mucus membranes or skin necrosis: No Has patient had a PCN reaction that required hospitalization No Has patient had a PCN reaction occurring within the last 10 years: No If all of the above answers are "NO", then may proceed with Cephalosporin use.  . Vioxx [Rofecoxib] Nausea And Vomiting    Current Outpatient Medications  Medication Sig Dispense Refill  . acetaminophen (TYLENOL) 500 MG tablet Take 1,000 mg by mouth every 6 (six) hours as needed for mild pain, moderate pain, fever or headache.     . allopurinol (ZYLOPRIM) 100 MG tablet Take 1 tablet (100 mg total) by mouth daily. (Patient taking differently: Take 200 mg by mouth daily. ) 30 tablet 2  . amLODipine (NORVASC) 5 MG tablet Take 5 mg by mouth daily.    . calcium acetate (PHOSLO) 667 MG capsule Take 2,668 mg by mouth 3 (three) times daily with meals.    . carvedilol (COREG)  6.25 MG tablet Take 6.25 mg by mouth 2 (two) times daily with a meal.    . ceFAZolin (ANCEF) 2-4 GM/100ML-% IVPB Inject 100 mLs (2 g total) into the vein every Monday, Wednesday, and Friday. For 2 more weeks 1 each   . cinacalcet (SENSIPAR) 60 MG tablet Take 60 mg by mouth daily.    . furosemide (LASIX) 40 MG tablet Take 40 mg by mouth daily.    Marland Kitchen gabapentin (NEURONTIN) 100 MG capsule Take 100 mg by mouth 3 (three) times daily.     Marland Kitchen HYDROcodone-acetaminophen (NORCO/VICODIN) 5-325 MG tablet Take 1 tablet by mouth every 6 (six) hours as needed for severe pain. 20 tablet 0  . Insulin Detemir (LEVEMIR FLEXTOUCH)  100 UNIT/ML Pen Inject 30 Units into the skin 2 (two) times daily.     Marland Kitchen NOVOLOG FLEXPEN 100 UNIT/ML FlexPen Inject 10 Units into the skin 3 (three) times daily with meals. (Patient taking differently: Inject 15 Units into the skin 2 (two) times daily. ) 15 mL 6  . ondansetron (ZOFRAN) 4 MG tablet Take 1 tablet (4 mg total) by mouth every 6 (six) hours as needed for nausea. 20 tablet 0  . pravastatin (PRAVACHOL) 20 MG tablet Take 20 mg by mouth daily.     Marland Kitchen oxyCODONE-acetaminophen (PERCOCET/ROXICET) 5-325 MG tablet Take 1 tablet by mouth every 6 (six) hours as needed. (Patient not taking: Reported on 08/03/2018) 6 tablet 0   No current facility-administered medications for this visit.      REVIEW OF SYSTEMS: see HPI for pertinent positives and negatives    PHYSICAL EXAMINATION:  Vitals:   08/03/18 1013  BP: 122/68  Pulse: 89  Resp: (!) 21  Temp: 98.2 F (36.8 C)  TempSrc: Oral  SpO2: 96%  Weight: (!) 330 lb 9.6 oz (150 kg)  Height: 6' (1.829 m)   Body mass index is 44.84 kg/m.  General: Morbidly obese male.   HEENT:  No gross abnormalities Pulmonary: Respirations are non-labored Abdomen: Soft and non-tender with. Musculoskeletal: There are no major deformities.   Neurologic: No focal weakness or paresthesias are detected, Skin: There are no ulcer or rashes  noted. Psychiatric: The patient has normal affect. Cardiovascular: There is a regular rate and rhythm without significant murmur appreciated.  Right AVF thrill is faintly palpable, bruit is faintly audible. Two aneurysmal degenerative changes of right forearm AV fistula, the distal aneurysm has a scab, no bleeding. See photo below.    Right forearm AVF, ulcer at one of the two small aneurysms     Medical Decision Making  REQUAN HARDGE is a 62 y.o. male who is s/p plication of right radial cephalic AVF aneurysm and resection of ulcer on right radial cephalic AVF by Dr. Trula Slade on 01/19/17.  He then underwent exploration and removal of chronic thrombus near the arterial anastomosis on 07-04-17 by Dr. Donnetta Hutching for partially thrombosed right arm AV fistula.   He returns with an ulceration on the distal aneurysmal degeneration of his right forearm AV fistula; this has not had any bleeding.   Dr. Oneida Alar spoke with and examined pt. Will schedule for plication right arm AVF ulcer and aneurysm to try to salvage the AVF, by Dr. Scot Dock next Tuesday, 08-08-18 (a non HD day). Pt stated he did not want to abandon this AVF and create a new access unless this AVF is not salvageable. He is undergoing evaluation for renal transplant.     Clemon Chambers, RN, MSN, FNP-C [facility] [place] Office: [phone]  08/03/2018, 10:56 AM  Clinic MD: Oneida Alar

## 2018-08-04 DIAGNOSIS — E119 Type 2 diabetes mellitus without complications: Secondary | ICD-10-CM | POA: Diagnosis not present

## 2018-08-04 DIAGNOSIS — N2581 Secondary hyperparathyroidism of renal origin: Secondary | ICD-10-CM | POA: Diagnosis not present

## 2018-08-04 DIAGNOSIS — D509 Iron deficiency anemia, unspecified: Secondary | ICD-10-CM | POA: Diagnosis not present

## 2018-08-04 DIAGNOSIS — Z23 Encounter for immunization: Secondary | ICD-10-CM | POA: Diagnosis not present

## 2018-08-04 DIAGNOSIS — N186 End stage renal disease: Secondary | ICD-10-CM | POA: Diagnosis not present

## 2018-08-07 DIAGNOSIS — D509 Iron deficiency anemia, unspecified: Secondary | ICD-10-CM | POA: Diagnosis not present

## 2018-08-07 DIAGNOSIS — N186 End stage renal disease: Secondary | ICD-10-CM | POA: Diagnosis not present

## 2018-08-07 DIAGNOSIS — N2581 Secondary hyperparathyroidism of renal origin: Secondary | ICD-10-CM | POA: Diagnosis not present

## 2018-08-07 DIAGNOSIS — Z23 Encounter for immunization: Secondary | ICD-10-CM | POA: Diagnosis not present

## 2018-08-07 DIAGNOSIS — E119 Type 2 diabetes mellitus without complications: Secondary | ICD-10-CM | POA: Diagnosis not present

## 2018-08-07 MED ORDER — VANCOMYCIN HCL 10 G IV SOLR
1500.0000 mg | INTRAVENOUS | Status: AC
  Administered 2018-08-08: 1500 mg via INTRAVENOUS
  Filled 2018-08-07: qty 1500

## 2018-08-07 NOTE — Progress Notes (Signed)
Several unsuccessful attempts were made to contact pt. Pre-op instructions given only; please complete assessment on DOS. Pt requested that spouse, [name], be provided with the pre-op instructions. Spouse made aware to have pt stop taking vitamins, fish oil and herbal medications. Do not take any NSAIDs ie: Ibuprofen, Advil, Naproxen (Aleve), Motrin, BC and Goody Powder. Spouse made aware to have pt take only 5 units of Levemir insulin tonight. Spouse stated that pt does not have supplies needed to check blood glucose. No insulin DOS. Spouse verbalized understanding of all pre-op instructions.

## 2018-08-08 ENCOUNTER — Ambulatory Visit (HOSPITAL_COMMUNITY): Payer: Medicare Other | Admitting: Certified Registered Nurse Anesthetist

## 2018-08-08 ENCOUNTER — Ambulatory Visit (HOSPITAL_COMMUNITY)
Admission: RE | Admit: 2018-08-08 | Discharge: 2018-08-08 | Disposition: A | Discharge status: Home / Self Care | Payer: Medicare Other | Source: Ambulatory Visit | Attending: Vascular Surgery | Admitting: Vascular Surgery

## 2018-08-08 ENCOUNTER — Other Ambulatory Visit: Payer: Self-pay

## 2018-08-08 ENCOUNTER — Encounter (HOSPITAL_COMMUNITY)
Admission: RE | Disposition: A | Discharge status: Home / Self Care | Payer: Self-pay | Source: Ambulatory Visit | Attending: Vascular Surgery

## 2018-08-08 ENCOUNTER — Encounter (HOSPITAL_COMMUNITY): Payer: Self-pay

## 2018-08-08 DIAGNOSIS — E1122 Type 2 diabetes mellitus with diabetic chronic kidney disease: Secondary | ICD-10-CM | POA: Diagnosis not present

## 2018-08-08 DIAGNOSIS — Y832 Surgical operation with anastomosis, bypass or graft as the cause of abnormal reaction of the patient, or of later complication, without mention of misadventure at the time of the procedure: Secondary | ICD-10-CM | POA: Diagnosis not present

## 2018-08-08 DIAGNOSIS — M109 Gout, unspecified: Secondary | ICD-10-CM | POA: Diagnosis not present

## 2018-08-08 DIAGNOSIS — Z9989 Dependence on other enabling machines and devices: Secondary | ICD-10-CM | POA: Diagnosis not present

## 2018-08-08 DIAGNOSIS — Z794 Long term (current) use of insulin: Secondary | ICD-10-CM | POA: Insufficient documentation

## 2018-08-08 DIAGNOSIS — Z6841 Body Mass Index (BMI) 40.0 and over, adult: Secondary | ICD-10-CM | POA: Insufficient documentation

## 2018-08-08 DIAGNOSIS — I132 Hypertensive heart and chronic kidney disease with heart failure and with stage 5 chronic kidney disease, or end stage renal disease: Secondary | ICD-10-CM | POA: Diagnosis not present

## 2018-08-08 DIAGNOSIS — Z79899 Other long term (current) drug therapy: Secondary | ICD-10-CM | POA: Insufficient documentation

## 2018-08-08 DIAGNOSIS — G4730 Sleep apnea, unspecified: Secondary | ICD-10-CM | POA: Diagnosis not present

## 2018-08-08 DIAGNOSIS — T82898A Other specified complication of vascular prosthetic devices, implants and grafts, initial encounter: Secondary | ICD-10-CM | POA: Insufficient documentation

## 2018-08-08 DIAGNOSIS — N186 End stage renal disease: Secondary | ICD-10-CM | POA: Insufficient documentation

## 2018-08-08 DIAGNOSIS — T82590A Other mechanical complication of surgically created arteriovenous fistula, initial encounter: Secondary | ICD-10-CM | POA: Diagnosis not present

## 2018-08-08 DIAGNOSIS — Z87891 Personal history of nicotine dependence: Secondary | ICD-10-CM | POA: Insufficient documentation

## 2018-08-08 DIAGNOSIS — I509 Heart failure, unspecified: Secondary | ICD-10-CM | POA: Diagnosis not present

## 2018-08-08 HISTORY — PX: REVISON OF ARTERIOVENOUS FISTULA: SHX6074

## 2018-08-08 LAB — GLUCOSE, CAPILLARY
GLUCOSE-CAPILLARY: 244 mg/dL — AB (ref 70–99)
GLUCOSE-CAPILLARY: 193 mg/dL — AB (ref 70–99)

## 2018-08-08 LAB — POCT I-STAT 4, (NA,K, GLUC, HGB,HCT)
GLUCOSE: 252 mg/dL — AB (ref 70–99)
HEMATOCRIT: 44 % (ref 39.0–52.0)
HEMOGLOBIN: 15 g/dL (ref 13.0–17.0)
POTASSIUM: 4.9 mmol/L (ref 3.5–5.1)
SODIUM: 136 mmol/L (ref 135–145)

## 2018-08-08 LAB — HEMOGLOBIN A1C
Hgb A1c MFr Bld: 10.5 % — ABNORMAL HIGH (ref 4.8–5.6)
Mean Plasma Glucose: 254.65 mg/dL

## 2018-08-08 SURGERY — REVISON OF ARTERIOVENOUS FISTULA
Anesthesia: General | Site: Arm Lower | Laterality: Right

## 2018-08-08 MED ORDER — SUCCINYLCHOLINE CHLORIDE 200 MG/10ML IV SOSY
PREFILLED_SYRINGE | INTRAVENOUS | Status: DC | PRN
  Administered 2018-08-08: 120 mg via INTRAVENOUS

## 2018-08-08 MED ORDER — LIDOCAINE 2% (20 MG/ML) 5 ML SYRINGE
INTRAMUSCULAR | Status: AC
  Filled 2018-08-08: qty 5

## 2018-08-08 MED ORDER — FENTANYL CITRATE (PF) 100 MCG/2ML IJ SOLN: 25.0000 ug | INTRAMUSCULAR | Status: DC | PRN

## 2018-08-08 MED ORDER — LIDOCAINE 2% (20 MG/ML) 5 ML SYRINGE
INTRAMUSCULAR | Status: DC | PRN
  Administered 2018-08-08: 60 mg via INTRAVENOUS

## 2018-08-08 MED ORDER — SODIUM CHLORIDE 0.9 % IV SOLN
INTRAVENOUS | Status: AC
  Filled 2018-08-08 (×2): qty 1.2

## 2018-08-08 MED ORDER — PROPOFOL 10 MG/ML IV BOLUS
INTRAVENOUS | Status: AC
  Filled 2018-08-08: qty 20

## 2018-08-08 MED ORDER — SUGAMMADEX SODIUM 500 MG/5ML IV SOLN
INTRAVENOUS | Status: AC
  Filled 2018-08-08: qty 5

## 2018-08-08 MED ORDER — SODIUM CHLORIDE 0.9 % IV SOLN
INTRAVENOUS | Status: DC
  Administered 2018-08-08: 09:00:00 via INTRAVENOUS

## 2018-08-08 MED ORDER — HEPARIN SODIUM (PORCINE) 1000 UNIT/ML IJ SOLN
INTRAMUSCULAR | Status: DC | PRN
  Administered 2018-08-08: 12000 [IU] via INTRAVENOUS

## 2018-08-08 MED ORDER — PHENYLEPHRINE 40 MCG/ML (10ML) SYRINGE FOR IV PUSH (FOR BLOOD PRESSURE SUPPORT)
PREFILLED_SYRINGE | INTRAVENOUS | Status: DC | PRN
  Administered 2018-08-08: 40 ug via INTRAVENOUS
  Administered 2018-08-08 (×2): 80 ug via INTRAVENOUS
  Administered 2018-08-08: 40 ug via INTRAVENOUS

## 2018-08-08 MED ORDER — LIDOCAINE-EPINEPHRINE (PF) 1 %-1:200000 IJ SOLN
INTRAMUSCULAR | Status: AC
  Filled 2018-08-08: qty 60

## 2018-08-08 MED ORDER — SODIUM CHLORIDE 0.9 % IV SOLN
INTRAVENOUS | Status: DC | PRN
  Administered 2018-08-08: 500 mL

## 2018-08-08 MED ORDER — PROPOFOL 1000 MG/100ML IV EMUL
INTRAVENOUS | Status: AC
  Filled 2018-08-08: qty 100

## 2018-08-08 MED ORDER — HEPARIN SODIUM (PORCINE) 1000 UNIT/ML IJ SOLN
INTRAMUSCULAR | Status: AC
  Filled 2018-08-08: qty 1

## 2018-08-08 MED ORDER — EPHEDRINE SULFATE-NACL 50-0.9 MG/10ML-% IV SOSY
PREFILLED_SYRINGE | INTRAVENOUS | Status: DC | PRN
  Administered 2018-08-08 (×2): 5 mg via INTRAVENOUS

## 2018-08-08 MED ORDER — SODIUM CHLORIDE 0.9 % IR SOLN
Status: DC | PRN
  Administered 2018-08-08: 1000 mL

## 2018-08-08 MED ORDER — PROPOFOL 10 MG/ML IV BOLUS
INTRAVENOUS | Status: DC | PRN
  Administered 2018-08-08: 200 mg via INTRAVENOUS

## 2018-08-08 MED ORDER — LIDOCAINE-EPINEPHRINE (PF) 1 %-1:200000 IJ SOLN
INTRAMUSCULAR | Status: DC | PRN
  Administered 2018-08-08: 30 mL

## 2018-08-08 MED ORDER — ONDANSETRON HCL 4 MG/2ML IJ SOLN
INTRAMUSCULAR | Status: AC
  Filled 2018-08-08: qty 2

## 2018-08-08 MED ORDER — PROTAMINE SULFATE 10 MG/ML IV SOLN
INTRAVENOUS | Status: AC
  Filled 2018-08-08: qty 5

## 2018-08-08 MED ORDER — MIDAZOLAM HCL 2 MG/2ML IJ SOLN
INTRAMUSCULAR | Status: AC
  Filled 2018-08-08: qty 2

## 2018-08-08 MED ORDER — FENTANYL CITRATE (PF) 100 MCG/2ML IJ SOLN
INTRAMUSCULAR | Status: DC | PRN
  Administered 2018-08-08 (×2): 50 ug via INTRAVENOUS

## 2018-08-08 MED ORDER — SODIUM CHLORIDE 0.9 % IV SOLN
INTRAVENOUS | Status: DC | PRN
  Administered 2018-08-08: 30 ug/min via INTRAVENOUS

## 2018-08-08 MED ORDER — ONDANSETRON HCL 4 MG/2ML IJ SOLN
INTRAMUSCULAR | Status: DC | PRN
  Administered 2018-08-08: 4 mg via INTRAVENOUS

## 2018-08-08 MED ORDER — ESMOLOL HCL 100 MG/10ML IV SOLN
INTRAVENOUS | Status: AC
  Filled 2018-08-08: qty 10

## 2018-08-08 MED ORDER — FENTANYL CITRATE (PF) 250 MCG/5ML IJ SOLN
INTRAMUSCULAR | Status: AC
  Filled 2018-08-08: qty 5

## 2018-08-08 MED ORDER — VANCOMYCIN HCL IN DEXTROSE 1-5 GM/200ML-% IV SOLN
INTRAVENOUS | Status: AC
  Filled 2018-08-08: qty 200

## 2018-08-08 MED ORDER — OXYCODONE-ACETAMINOPHEN 5-325 MG PO TABS: 1.0000 | ORAL_TABLET | Freq: Four times a day (QID) | ORAL | 0 refills | Status: DC | PRN

## 2018-08-08 MED ORDER — INSULIN ASPART 100 UNIT/ML ~~LOC~~ SOLN
6.0000 [IU] | Freq: Once | SUBCUTANEOUS | Status: AC
  Administered 2018-08-08: 6 [IU] via SUBCUTANEOUS

## 2018-08-08 SURGICAL SUPPLY — 44 items
ARMBAND PINK RESTRICT EXTREMIT (MISCELLANEOUS) ×2 IMPLANT
CANISTER SUCT 3000ML PPV (MISCELLANEOUS) ×2 IMPLANT
CANNULA VESSEL 3MM 2 BLNT TIP (CANNULA) ×2 IMPLANT
CATH EMB 4FR 40CM (CATHETERS) ×2 IMPLANT
CLIP VESOCCLUDE MED 6/CT (CLIP) ×2 IMPLANT
CLIP VESOCCLUDE SM WIDE 6/CT (CLIP) ×2 IMPLANT
COVER PROBE W GEL 5X96 (DRAPES) IMPLANT
CUFF TOURNIQUET SINGLE 18IN (TOURNIQUET CUFF) ×2 IMPLANT
DERMABOND ADVANCED (GAUZE/BANDAGES/DRESSINGS) ×1
DERMABOND ADVANCED .7 DNX12 (GAUZE/BANDAGES/DRESSINGS) ×1 IMPLANT
ELECT REM PT RETURN 9FT ADLT (ELECTROSURGICAL) ×2
ELECTRODE REM PT RTRN 9FT ADLT (ELECTROSURGICAL) ×1 IMPLANT
GLOVE BIO SURGEON STRL SZ 6.5 (GLOVE) ×2 IMPLANT
GLOVE BIO SURGEON STRL SZ7 (GLOVE) ×2 IMPLANT
GLOVE BIO SURGEON STRL SZ7.5 (GLOVE) ×2 IMPLANT
GLOVE BIOGEL PI IND STRL 6.5 (GLOVE) ×2 IMPLANT
GLOVE BIOGEL PI IND STRL 7.0 (GLOVE) ×2 IMPLANT
GLOVE BIOGEL PI IND STRL 7.5 (GLOVE) ×1 IMPLANT
GLOVE BIOGEL PI IND STRL 8 (GLOVE) ×1 IMPLANT
GLOVE BIOGEL PI INDICATOR 6.5 (GLOVE) ×2
GLOVE BIOGEL PI INDICATOR 7.0 (GLOVE) ×2
GLOVE BIOGEL PI INDICATOR 7.5 (GLOVE) ×1
GLOVE BIOGEL PI INDICATOR 8 (GLOVE) ×1
GLOVE ECLIPSE 7.0 STRL STRAW (GLOVE) ×2 IMPLANT
GOWN STRL REUS W/ TWL LRG LVL3 (GOWN DISPOSABLE) ×3 IMPLANT
GOWN STRL REUS W/ TWL XL LVL3 (GOWN DISPOSABLE) ×1 IMPLANT
GOWN STRL REUS W/TWL LRG LVL3 (GOWN DISPOSABLE) ×3
GOWN STRL REUS W/TWL XL LVL3 (GOWN DISPOSABLE) ×1
KIT BASIN OR (CUSTOM PROCEDURE TRAY) ×2 IMPLANT
KIT TURNOVER KIT B (KITS) ×2 IMPLANT
NS IRRIG 1000ML POUR BTL (IV SOLUTION) ×2 IMPLANT
PACK CV ACCESS (CUSTOM PROCEDURE TRAY) ×2 IMPLANT
PAD ARMBOARD 7.5X6 YLW CONV (MISCELLANEOUS) ×4 IMPLANT
SLEEVE SURGEON STRL (DRAPES) ×2 IMPLANT
SPONGE SURGIFOAM ABS GEL 100 (HEMOSTASIS) IMPLANT
SUT PROLENE 5 0 C 1 24 (SUTURE) ×2 IMPLANT
SUT PROLENE 6 0 BV (SUTURE) ×2 IMPLANT
SUT VIC AB 3-0 SH 27 (SUTURE) ×1
SUT VIC AB 3-0 SH 27X BRD (SUTURE) ×1 IMPLANT
SUT VIC AB 4-0 PS2 27 (SUTURE) ×2 IMPLANT
SUT VICRYL 4-0 PS2 18IN ABS (SUTURE) ×2 IMPLANT
TOWEL GREEN STERILE (TOWEL DISPOSABLE) ×2 IMPLANT
UNDERPAD 30X30 (UNDERPADS AND DIAPERS) ×2 IMPLANT
WATER STERILE IRR 1000ML POUR (IV SOLUTION) ×2 IMPLANT

## 2018-08-08 NOTE — Discharge Instructions (Signed)
Do not stick at new incisions for 8 weeks stick above and below.

## 2018-08-08 NOTE — Anesthesia Postprocedure Evaluation (Signed)
Anesthesia Post Note  Patient: DEUCE PATERNOSTER  Procedure(s) Performed: REVISION PLICATION OF ARTERIOVENOUS FISTULA FOREARM (Right Arm Lower)     Patient location during evaluation: PACU Anesthesia Type: General Level of consciousness: awake and alert Pain management: pain level controlled Vital Signs Assessment: post-procedure vital signs reviewed and stable Respiratory status: spontaneous breathing, nonlabored ventilation and respiratory function stable Cardiovascular status: blood pressure returned to baseline and stable Postop Assessment: no apparent nausea or vomiting Anesthetic complications: no    Last Vitals:  Vitals:   08/08/18 1235 08/08/18 1254  BP: (!) 150/86 127/78  Pulse: 68 66  Resp: (!) 26 (!) 29  Temp:    SpO2: 98% 97%    Last Pain:  Vitals:   08/08/18 1254  TempSrc:   PainSc: 0-No pain                 Cailan Antonucci,W. EDMOND

## 2018-08-08 NOTE — Transfer of Care (Signed)
Immediate Anesthesia Transfer of Care Note  Patient: [name]  Procedure(s) Performed: REVISION PLICATION OF ARTERIOVENOUS FISTULA FOREARM (Right Arm Lower)  Patient Location: PACU  Anesthesia Type:General  Level of Consciousness: awake, alert , oriented and patient cooperative  Airway & Oxygen Therapy: Patient Spontanous Breathing and Patient connected to face mask oxygen  Post-op Assessment: Report given to RN, Post -op Vital signs reviewed and stable and Patient moving all extremities  Post vital signs: Reviewed and stable  Last Vitals:  Vitals Value Taken Time  BP 148/88 08/08/2018 11:50 AM  Temp    Pulse 71 08/08/2018 11:55 AM  Resp 28 08/08/2018 11:55 AM  SpO2 100 % 08/08/2018 11:55 AM  Vitals shown include unvalidated device data.  Last Pain:  Vitals:   08/08/18 0826  TempSrc: Oral  PainSc:          Complications: No apparent anesthesia complications

## 2018-08-08 NOTE — Anesthesia Procedure Notes (Addendum)
Procedure Name: Intubation Date/Time: 08/08/2018 10:36 AM Performed by: Colin Benton, CRNA Pre-anesthesia Checklist: Patient identified, Emergency Drugs available, Suction available and Patient being monitored Patient Re-evaluated:Patient Re-evaluated prior to induction Oxygen Delivery Method: Circle System Utilized Preoxygenation: Pre-oxygenation with 100% oxygen Induction Type: IV induction and Rapid sequence Ventilation: Two handed mask ventilation required Laryngoscope Size: Glidescope and 4 Grade View: Grade I Tube type: Oral Tube size: 7.5 mm Number of attempts: 2 Airway Equipment and Method: Stylet,  Oral airway and Video-laryngoscopy Placement Confirmation: ETT inserted through vocal cords under direct vision,  positive ETCO2 and breath sounds checked- equal and bilateral Secured at: 22 cm Tube secured with: Tape Dental Injury: Teeth and Oropharynx as per pre-operative assessment  Difficulty Due To: Difficult Airway- due to limited oral opening and Difficult Airway- due to large tongue Comments: SRNA placed LMA, TV 200's, readjusted, TV's still 200's. Converted, pt given succ and masked. SRNA Mandy DL x1 with Mac 4 no view, Masked and DL with glidescope.

## 2018-08-08 NOTE — Anesthesia Preprocedure Evaluation (Addendum)
Anesthesia Evaluation  Patient identified by MRN, date of birth, ID band Patient awake    Reviewed: Allergy & Precautions, H&P , NPO status , Patient's Chart, lab work & pertinent test results, reviewed documented beta blocker date and time   Airway Mallampati: II  TM Distance: >3 FB Neck ROM: Full    Dental no notable dental hx. (+) Dental Advisory Given,    Pulmonary sleep apnea and Continuous Positive Airway Pressure Ventilation , former smoker,    Pulmonary exam normal breath sounds clear to auscultation       Cardiovascular hypertension, Pt. on medications and Pt. on home beta blockers +CHF   Rhythm:Regular Rate:Normal     Neuro/Psych  Headaches, negative psych ROS   GI/Hepatic GERD  Controlled,(+) Hepatitis -, C  Endo/Other  diabetes, Insulin DependentMorbid obesity  Renal/GU ESRF and DialysisRenal disease  negative genitourinary   Musculoskeletal   Abdominal   Peds  Hematology negative hematology ROS (+) anemia ,   Anesthesia Other Findings   Reproductive/Obstetrics negative OB ROS                           Anesthesia Physical Anesthesia Plan  ASA: III  Anesthesia Plan: General   Post-op Pain Management:    Induction: Intravenous  PONV Risk Score and Plan: 3 and Ondansetron, Midazolam and Treatment may vary due to age or medical condition  Airway Management Planned: LMA  Additional Equipment:   Intra-op Plan:   Post-operative Plan: Extubation in OR  Informed Consent: I have reviewed the patients History and Physical, chart, labs and discussed the procedure including the risks, benefits and alternatives for the proposed anesthesia with the patient or authorized representative who has indicated his/her understanding and acceptance.   Dental advisory given  Plan Discussed with: CRNA  Anesthesia Plan Comments:         Anesthesia Quick Evaluation

## 2018-08-08 NOTE — Op Note (Signed)
    NAME: [name]    MRN: [id number] DOB: January 29, 1956    DATE OF OPERATION: 08/08/2018  PREOP DIAGNOSIS:    Eschar overlying right forearm AV fistula x2  POSTOP DIAGNOSIS:    Same  PROCEDURE:    Excision of eschar overlying right forearm AV fistula and repair of fistula x2  SURGEON: Judeth Cornfield. Scot Dock, MD, FACS  ASSIST: Laurence Slate, PA  ANESTHESIA: General  EBL: Minimal  INDICATIONS:    [name] is a 62 y.o. male who has a right forearm AV fistula.  He was seen in the office by the nurse practitioner and had 2 areas with the skin had formed erosion overlying the fistula.  He was set up for repair of this.  FINDINGS:   I was able to excise the 2 eschars in place a 5-0 Prolene at the site where this was adjacent to the fistula.  There should be enough room to cannulate above this.  He was very reluctant to consider placement of a catheter.  I also discussed with patient and the family that at some point he will likely need to be evaluated for new access.  TECHNIQUE:   The patient was taken to the operating room and received a general anesthetic.  The right arm was prepped and draped in usual sterile fashion.  A elliptical incision was made over the 2 eschars and I undermined the skin in both directions for both incisions such that the there was enough tissue to close over the fistula after this was repaired.  The patient was then heparinized.  I then placed a tourniquet on the upper arm.  The arm was exsanguinated and tourniquet inflated 200 mmHg.  I then excised the eschars which were adjacent to the fistulas.  At the more distal when I made a small transverse venotomy and passed the Fogarty catheter up the fistula and did not retrieve any clot.  There is some laminated clot within an aneurysm of the fistula but I was afraid that it to address this would not leave any further room to cannulate the fistula.  This venotomy was closed with a running 5-0 Prolene suture.   The hole in the second area was closed with a 5-0 Prolene.  Hemostasis was obtained in the wounds.  The wounds were then closed with running 4-0 Vicryl.  Dermabond was applied.  The patient tolerated the procedure well was transferred to the recovery room in stable condition.  All needle sponge counts were correct. Deitra Mayo, MD, FACS [facility]  DATE OF DICTATION:   08/08/2018

## 2018-08-08 NOTE — Interval H&P Note (Signed)
History and Physical Interval Note:  08/08/2018 9:53 AM  [name]  has presented today for surgery, with the diagnosis of fistula pseudoaneurysm  The various methods of treatment have been discussed with the patient and family. After consideration of risks, benefits and other options for treatment, the patient has consented to  Procedure(s): [place] (Right) as a surgical intervention .  The patient's history has been reviewed, patient examined, no change in status, stable for surgery.  I have reviewed the patient's chart and labs.  Questions were answered to the patient's satisfaction.     Deitra Mayo

## 2018-08-09 ENCOUNTER — Encounter (HOSPITAL_COMMUNITY): Payer: Self-pay | Admitting: Vascular Surgery

## 2018-08-09 ENCOUNTER — Telehealth: Payer: Self-pay | Admitting: Vascular Surgery

## 2018-08-09 DIAGNOSIS — D509 Iron deficiency anemia, unspecified: Secondary | ICD-10-CM | POA: Diagnosis not present

## 2018-08-09 DIAGNOSIS — N2581 Secondary hyperparathyroidism of renal origin: Secondary | ICD-10-CM | POA: Diagnosis not present

## 2018-08-09 DIAGNOSIS — Z23 Encounter for immunization: Secondary | ICD-10-CM | POA: Diagnosis not present

## 2018-08-09 DIAGNOSIS — E119 Type 2 diabetes mellitus without complications: Secondary | ICD-10-CM | POA: Diagnosis not present

## 2018-08-09 DIAGNOSIS — N186 End stage renal disease: Secondary | ICD-10-CM | POA: Diagnosis not present

## 2018-08-09 NOTE — Telephone Encounter (Signed)
sch appt spk to pt may have to resch due to HD 08/23/18 130pm  P/o PA

## 2018-08-11 DIAGNOSIS — N186 End stage renal disease: Secondary | ICD-10-CM | POA: Diagnosis not present

## 2018-08-11 DIAGNOSIS — Z23 Encounter for immunization: Secondary | ICD-10-CM | POA: Diagnosis not present

## 2018-08-11 DIAGNOSIS — N2581 Secondary hyperparathyroidism of renal origin: Secondary | ICD-10-CM | POA: Diagnosis not present

## 2018-08-11 DIAGNOSIS — D509 Iron deficiency anemia, unspecified: Secondary | ICD-10-CM | POA: Diagnosis not present

## 2018-08-11 DIAGNOSIS — E119 Type 2 diabetes mellitus without complications: Secondary | ICD-10-CM | POA: Diagnosis not present

## 2018-08-14 DIAGNOSIS — E119 Type 2 diabetes mellitus without complications: Secondary | ICD-10-CM | POA: Diagnosis not present

## 2018-08-14 DIAGNOSIS — N186 End stage renal disease: Secondary | ICD-10-CM | POA: Diagnosis not present

## 2018-08-14 DIAGNOSIS — Z23 Encounter for immunization: Secondary | ICD-10-CM | POA: Diagnosis not present

## 2018-08-14 DIAGNOSIS — N2581 Secondary hyperparathyroidism of renal origin: Secondary | ICD-10-CM | POA: Diagnosis not present

## 2018-08-14 DIAGNOSIS — D509 Iron deficiency anemia, unspecified: Secondary | ICD-10-CM | POA: Diagnosis not present

## 2018-08-15 DIAGNOSIS — E1129 Type 2 diabetes mellitus with other diabetic kidney complication: Secondary | ICD-10-CM | POA: Diagnosis not present

## 2018-08-15 DIAGNOSIS — N186 End stage renal disease: Secondary | ICD-10-CM | POA: Diagnosis not present

## 2018-08-15 DIAGNOSIS — Z992 Dependence on renal dialysis: Secondary | ICD-10-CM | POA: Diagnosis not present

## 2018-08-16 DIAGNOSIS — N186 End stage renal disease: Secondary | ICD-10-CM | POA: Diagnosis not present

## 2018-08-16 DIAGNOSIS — D509 Iron deficiency anemia, unspecified: Secondary | ICD-10-CM | POA: Diagnosis not present

## 2018-08-16 DIAGNOSIS — N2581 Secondary hyperparathyroidism of renal origin: Secondary | ICD-10-CM | POA: Diagnosis not present

## 2018-08-18 DIAGNOSIS — D509 Iron deficiency anemia, unspecified: Secondary | ICD-10-CM | POA: Diagnosis not present

## 2018-08-18 DIAGNOSIS — N2581 Secondary hyperparathyroidism of renal origin: Secondary | ICD-10-CM | POA: Diagnosis not present

## 2018-08-18 DIAGNOSIS — N186 End stage renal disease: Secondary | ICD-10-CM | POA: Diagnosis not present

## 2018-08-21 DIAGNOSIS — D509 Iron deficiency anemia, unspecified: Secondary | ICD-10-CM | POA: Diagnosis not present

## 2018-08-21 DIAGNOSIS — N2581 Secondary hyperparathyroidism of renal origin: Secondary | ICD-10-CM | POA: Diagnosis not present

## 2018-08-21 DIAGNOSIS — N186 End stage renal disease: Secondary | ICD-10-CM | POA: Diagnosis not present

## 2018-08-23 DIAGNOSIS — D509 Iron deficiency anemia, unspecified: Secondary | ICD-10-CM | POA: Diagnosis not present

## 2018-08-23 DIAGNOSIS — N2581 Secondary hyperparathyroidism of renal origin: Secondary | ICD-10-CM | POA: Diagnosis not present

## 2018-08-23 DIAGNOSIS — N186 End stage renal disease: Secondary | ICD-10-CM | POA: Diagnosis not present

## 2018-08-23 NOTE — Progress Notes (Deleted)
  POST OPERATIVE OFFICE NOTE    CC:  F/u for surgery  HPI:   [name] is a 62 y.o. male who has a right forearm AV fistula.  He was seen in the office by the nurse practitioner and had 2 areas with the skin had formed erosion overlying the fistula.  He was set up for repair of this.  He is here today for follow up visit after   Excision of eschar overlying right forearm AV fistula and repair of fistula x2.    Allergies  Allergen Reactions  . Penicillins Nausea And Vomiting and Other (See Comments)    Has patient had a PCN reaction causing immediate rash, facial/tongue/throat swelling, SOB or lightheadedness with hypotension: No Has patient had a PCN reaction causing severe rash involving mucus membranes or skin necrosis: No Has patient had a PCN reaction that required hospitalization No Has patient had a PCN reaction occurring within the last 10 years: No If all of the above answers are "NO", then may proceed with Cephalosporin use.  . Vioxx [Rofecoxib] Nausea And Vomiting    Current Outpatient Medications  Medication Sig Dispense Refill  . acetaminophen (TYLENOL) 500 MG tablet Take 1,000 mg by mouth every 6 (six) hours as needed for mild pain, moderate pain, fever or headache.     . allopurinol (ZYLOPRIM) 100 MG tablet Take 1 tablet (100 mg total) by mouth daily. (Patient taking differently: Take 100 mg by mouth 2 (two) times daily. ) 30 tablet 2  . amLODipine (NORVASC) 5 MG tablet Take 5 mg by mouth daily.    [name] 1 GM 210 MG(Fe) tablet Take 420 mg by mouth 2 (two) times daily with a meal.   12  . carvedilol (COREG) 6.25 MG tablet Take 6.25 mg by mouth 2 (two) times daily with a meal.    . cinacalcet (SENSIPAR) 90 MG tablet Take 90 mg by mouth every Monday, Wednesday, and Friday.     . diphenhydrAMINE (BENADRYL) 25 MG tablet Take 25 mg by mouth every Monday, Wednesday, and Friday.    . furosemide (LASIX) 40 MG tablet Take 40 mg by mouth daily.    Marland Kitchen gabapentin (NEURONTIN)  100 MG capsule Take 100 mg by mouth 2 (two) times daily.     . Insulin Detemir (LEVEMIR FLEXTOUCH) 100 UNIT/ML Pen Inject 10 Units into the skin at bedtime.     Marland Kitchen NOVOLOG FLEXPEN 100 UNIT/ML FlexPen Inject 10 Units into the skin 3 (three) times daily with meals. (Patient taking differently: Inject 3 Units into the skin 2 (two) times daily. ) 15 mL 6  . oxyCODONE-acetaminophen (PERCOCET/ROXICET) 5-325 MG tablet Take 1 tablet by mouth every 6 (six) hours as needed. 10 tablet 0  . pravastatin (PRAVACHOL) 20 MG tablet Take 20 mg by mouth daily.      No current facility-administered medications for this visit.      ROS:  See HPI  Physical Exam:  There were no vitals filed for this visit.  Incision:  *** Extremities:  *** Neuro: *** Abdomen:  ***  Assessment/Plan:  This is a 62 y.o. male who is s/p: ***  -***   Leontine Locket, PA-C [facility] [phone]  Clinic MD:  ***

## 2018-08-24 DIAGNOSIS — Z961 Presence of intraocular lens: Secondary | ICD-10-CM | POA: Diagnosis not present

## 2018-08-24 DIAGNOSIS — E113293 Type 2 diabetes mellitus with mild nonproliferative diabetic retinopathy without macular edema, bilateral: Secondary | ICD-10-CM | POA: Diagnosis not present

## 2018-08-24 DIAGNOSIS — H40013 Open angle with borderline findings, low risk, bilateral: Secondary | ICD-10-CM | POA: Diagnosis not present

## 2018-08-25 DIAGNOSIS — N186 End stage renal disease: Secondary | ICD-10-CM | POA: Diagnosis not present

## 2018-08-25 DIAGNOSIS — N2581 Secondary hyperparathyroidism of renal origin: Secondary | ICD-10-CM | POA: Diagnosis not present

## 2018-08-25 DIAGNOSIS — D509 Iron deficiency anemia, unspecified: Secondary | ICD-10-CM | POA: Diagnosis not present

## 2018-08-28 DIAGNOSIS — N186 End stage renal disease: Secondary | ICD-10-CM | POA: Diagnosis not present

## 2018-08-28 DIAGNOSIS — D509 Iron deficiency anemia, unspecified: Secondary | ICD-10-CM | POA: Diagnosis not present

## 2018-08-28 DIAGNOSIS — N2581 Secondary hyperparathyroidism of renal origin: Secondary | ICD-10-CM | POA: Diagnosis not present

## 2018-08-29 ENCOUNTER — Encounter: Payer: Self-pay | Admitting: Vascular Surgery

## 2018-08-29 DIAGNOSIS — G4733 Obstructive sleep apnea (adult) (pediatric): Secondary | ICD-10-CM | POA: Diagnosis not present

## 2018-08-30 DIAGNOSIS — N2581 Secondary hyperparathyroidism of renal origin: Secondary | ICD-10-CM | POA: Diagnosis not present

## 2018-08-30 DIAGNOSIS — N186 End stage renal disease: Secondary | ICD-10-CM | POA: Diagnosis not present

## 2018-08-30 DIAGNOSIS — D509 Iron deficiency anemia, unspecified: Secondary | ICD-10-CM | POA: Diagnosis not present

## 2018-09-01 DIAGNOSIS — D509 Iron deficiency anemia, unspecified: Secondary | ICD-10-CM | POA: Diagnosis not present

## 2018-09-01 DIAGNOSIS — N186 End stage renal disease: Secondary | ICD-10-CM | POA: Diagnosis not present

## 2018-09-01 DIAGNOSIS — N2581 Secondary hyperparathyroidism of renal origin: Secondary | ICD-10-CM | POA: Diagnosis not present

## 2018-09-04 DIAGNOSIS — N186 End stage renal disease: Secondary | ICD-10-CM | POA: Diagnosis not present

## 2018-09-04 DIAGNOSIS — D509 Iron deficiency anemia, unspecified: Secondary | ICD-10-CM | POA: Diagnosis not present

## 2018-09-04 DIAGNOSIS — N2581 Secondary hyperparathyroidism of renal origin: Secondary | ICD-10-CM | POA: Diagnosis not present

## 2018-09-06 DIAGNOSIS — D509 Iron deficiency anemia, unspecified: Secondary | ICD-10-CM | POA: Diagnosis not present

## 2018-09-06 DIAGNOSIS — N186 End stage renal disease: Secondary | ICD-10-CM | POA: Diagnosis not present

## 2018-09-06 DIAGNOSIS — E119 Type 2 diabetes mellitus without complications: Secondary | ICD-10-CM | POA: Diagnosis not present

## 2018-09-06 DIAGNOSIS — N2581 Secondary hyperparathyroidism of renal origin: Secondary | ICD-10-CM | POA: Diagnosis not present

## 2018-09-08 DIAGNOSIS — N2581 Secondary hyperparathyroidism of renal origin: Secondary | ICD-10-CM | POA: Diagnosis not present

## 2018-09-08 DIAGNOSIS — N186 End stage renal disease: Secondary | ICD-10-CM | POA: Diagnosis not present

## 2018-09-08 DIAGNOSIS — D509 Iron deficiency anemia, unspecified: Secondary | ICD-10-CM | POA: Diagnosis not present

## 2018-09-11 DIAGNOSIS — D509 Iron deficiency anemia, unspecified: Secondary | ICD-10-CM | POA: Diagnosis not present

## 2018-09-11 DIAGNOSIS — N186 End stage renal disease: Secondary | ICD-10-CM | POA: Diagnosis not present

## 2018-09-11 DIAGNOSIS — N2581 Secondary hyperparathyroidism of renal origin: Secondary | ICD-10-CM | POA: Diagnosis not present

## 2018-09-13 ENCOUNTER — Other Ambulatory Visit: Payer: Self-pay

## 2018-09-13 ENCOUNTER — Ambulatory Visit (INDEPENDENT_AMBULATORY_CARE_PROVIDER_SITE_OTHER): Payer: Self-pay | Admitting: Physician Assistant

## 2018-09-13 VITALS — BP 174/89 | HR 83 | Temp 98.0°F | Resp 18 | Ht 73.0 in | Wt 328.0 lb

## 2018-09-13 DIAGNOSIS — N186 End stage renal disease: Secondary | ICD-10-CM | POA: Diagnosis not present

## 2018-09-13 DIAGNOSIS — Z992 Dependence on renal dialysis: Secondary | ICD-10-CM

## 2018-09-13 DIAGNOSIS — N2581 Secondary hyperparathyroidism of renal origin: Secondary | ICD-10-CM | POA: Diagnosis not present

## 2018-09-13 DIAGNOSIS — D509 Iron deficiency anemia, unspecified: Secondary | ICD-10-CM | POA: Diagnosis not present

## 2018-09-13 NOTE — Progress Notes (Signed)
POST OPERATIVE OFFICE NOTE    CC:  F/u for surgery  HPI:  This is a 62 y.o. male who is s/p excision of eschar overlying right forearm fistula by Dr. Scot Dock on 08/08/18.  He had been seen in the office by the NP and had 2 areas where there was errosion over the fistula and was recommended to undergo revision.  He returns today for follow up.  He states that he has done well since surgery.  They are able to use his fistula proximal to the area repaired.  He states that there is an area on the fistula that is not healing.  He states it bled after surgery, but has not bled since.  He states he has some bumps around the medial portion of the distal incision that he states were worse, but have gotten better.  It itches but is better with rubbing alcohol rubbed on it.    He dialyzes M/W/F at the location on [place].    Allergies  Allergen Reactions  . Penicillins Nausea And Vomiting and Other (See Comments)    Has patient had a PCN reaction causing immediate rash, facial/tongue/throat swelling, SOB or lightheadedness with hypotension: No Has patient had a PCN reaction causing severe rash involving mucus membranes or skin necrosis: No Has patient had a PCN reaction that required hospitalization No Has patient had a PCN reaction occurring within the last 10 years: No If all of the above answers are "NO", then may proceed with Cephalosporin use.  . Vioxx [Rofecoxib] Nausea And Vomiting    Current Outpatient Medications  Medication Sig Dispense Refill  . acetaminophen (TYLENOL) 500 MG tablet Take 1,000 mg by mouth every 6 (six) hours as needed for mild pain, moderate pain, fever or headache.     . allopurinol (ZYLOPRIM) 100 MG tablet Take 1 tablet (100 mg total) by mouth daily. (Patient taking differently: Take 100 mg by mouth 2 (two) times daily. ) 30 tablet 2  . amLODipine (NORVASC) 5 MG tablet Take 5 mg by mouth daily.    [name] 1 GM 210 MG(Fe) tablet Take 420 mg by mouth 2 (two)  times daily with a meal.   12  . carvedilol (COREG) 6.25 MG tablet Take 6.25 mg by mouth 2 (two) times daily with a meal.    . cinacalcet (SENSIPAR) 90 MG tablet Take 90 mg by mouth every Monday, Wednesday, and Friday.     . diphenhydrAMINE (BENADRYL) 25 MG tablet Take 25 mg by mouth every Monday, Wednesday, and Friday.    . furosemide (LASIX) 40 MG tablet Take 40 mg by mouth daily.    Marland Kitchen gabapentin (NEURONTIN) 100 MG capsule Take 100 mg by mouth 2 (two) times daily.     . Insulin Detemir (LEVEMIR FLEXTOUCH) 100 UNIT/ML Pen Inject 10 Units into the skin at bedtime.     Marland Kitchen NOVOLOG FLEXPEN 100 UNIT/ML FlexPen Inject 10 Units into the skin 3 (three) times daily with meals. (Patient taking differently: Inject 3 Units into the skin 2 (two) times daily. ) 15 mL 6  . oxyCODONE-acetaminophen (PERCOCET/ROXICET) 5-325 MG tablet Take 1 tablet by mouth every 6 (six) hours as needed. 10 tablet 0  . pravastatin (PRAVACHOL) 20 MG tablet Take 20 mg by mouth daily.      No current facility-administered medications for this visit.      ROS:  See HPI  Physical Exam:  Today's Vitals   09/13/18 1448  BP: (!) 174/89  Pulse: 83  Resp: 18  Temp: 98 F (36.7 C)  TempSrc: Oral  SpO2: 97%  Weight: (!) 328 lb (148.8 kg)  Height: 6\' 1"  (1.854 m)   Body mass index is 43.27 kg/m.  Incision:  Proximal incision is healed; distal incision still with superficial scab; there is some dermabond left on the incision.  Extremities:  +thrill/bruit within fistula; +palpable right radial pulse     Assessment/Plan:  This is a 62 y.o. male who is s/p: excision of eschar overlying right forearm fistula by Dr. Scot Dock on 08/08/18.  -pt has done well since surgery.  They are able to use his fistula proximal to the repaired area.  There is a superficial scab over the area of repair.  Given the fistula was repaired with prolene, I don't feel this is a scab over the fistula and feel that it is superficial wound.  Would continue  not to stick this area.  -I also removed 2 spitting stitches from the proximal and distal incision as well as removed some dermabond.  -I am going to have the pt follow up with me on Tuesday, November 5 in a week to check his wound.   -I did advise him to hold off on water aerobics until the wound has completely healed.     Leontine Locket, PA-C [facility] [phone]  Clinic MD:  Scot Dock

## 2018-09-14 DIAGNOSIS — M109 Gout, unspecified: Secondary | ICD-10-CM | POA: Diagnosis not present

## 2018-09-14 DIAGNOSIS — Z1211 Encounter for screening for malignant neoplasm of colon: Secondary | ICD-10-CM | POA: Diagnosis not present

## 2018-09-14 DIAGNOSIS — E0842 Diabetes mellitus due to underlying condition with diabetic polyneuropathy: Secondary | ICD-10-CM | POA: Diagnosis not present

## 2018-09-14 DIAGNOSIS — I10 Essential (primary) hypertension: Secondary | ICD-10-CM | POA: Diagnosis not present

## 2018-09-14 DIAGNOSIS — R600 Localized edema: Secondary | ICD-10-CM | POA: Diagnosis not present

## 2018-09-14 DIAGNOSIS — I509 Heart failure, unspecified: Secondary | ICD-10-CM | POA: Diagnosis not present

## 2018-09-14 DIAGNOSIS — N186 End stage renal disease: Secondary | ICD-10-CM | POA: Diagnosis not present

## 2018-09-14 DIAGNOSIS — G4730 Sleep apnea, unspecified: Secondary | ICD-10-CM | POA: Diagnosis not present

## 2018-09-14 DIAGNOSIS — E782 Mixed hyperlipidemia: Secondary | ICD-10-CM | POA: Diagnosis not present

## 2018-09-14 DIAGNOSIS — D472 Monoclonal gammopathy: Secondary | ICD-10-CM | POA: Diagnosis not present

## 2018-09-14 DIAGNOSIS — E1122 Type 2 diabetes mellitus with diabetic chronic kidney disease: Secondary | ICD-10-CM | POA: Diagnosis not present

## 2018-09-15 DIAGNOSIS — E119 Type 2 diabetes mellitus without complications: Secondary | ICD-10-CM | POA: Diagnosis not present

## 2018-09-15 DIAGNOSIS — N186 End stage renal disease: Secondary | ICD-10-CM | POA: Diagnosis not present

## 2018-09-15 DIAGNOSIS — Z992 Dependence on renal dialysis: Secondary | ICD-10-CM | POA: Diagnosis not present

## 2018-09-15 DIAGNOSIS — N2581 Secondary hyperparathyroidism of renal origin: Secondary | ICD-10-CM | POA: Diagnosis not present

## 2018-09-15 DIAGNOSIS — E1129 Type 2 diabetes mellitus with other diabetic kidney complication: Secondary | ICD-10-CM | POA: Diagnosis not present

## 2018-09-18 DIAGNOSIS — E119 Type 2 diabetes mellitus without complications: Secondary | ICD-10-CM | POA: Diagnosis not present

## 2018-09-18 DIAGNOSIS — N186 End stage renal disease: Secondary | ICD-10-CM | POA: Diagnosis not present

## 2018-09-18 DIAGNOSIS — N2581 Secondary hyperparathyroidism of renal origin: Secondary | ICD-10-CM | POA: Diagnosis not present

## 2018-09-19 ENCOUNTER — Ambulatory Visit (INDEPENDENT_AMBULATORY_CARE_PROVIDER_SITE_OTHER): Payer: Self-pay | Admitting: Physician Assistant

## 2018-09-19 ENCOUNTER — Ambulatory Visit: Payer: Medicare Other | Admitting: Vascular Surgery

## 2018-09-19 ENCOUNTER — Encounter: Payer: Self-pay | Admitting: Physician Assistant

## 2018-09-19 DIAGNOSIS — N186 End stage renal disease: Secondary | ICD-10-CM

## 2018-09-19 DIAGNOSIS — Z992 Dependence on renal dialysis: Secondary | ICD-10-CM

## 2018-09-19 NOTE — Progress Notes (Signed)
POST OPERATIVE OFFICE NOTE    CC:  F/u for surgery  HPI:  This is a 62 y.o. male who is s/p excision of eschar overlying right forearm fistula by Dr. Scot Dock on 08/08/18.  He had been seen in the office by the NP and had 2 areas where there was errosion over the fistula and was recommended to undergo revision.  He presented last week for his post operative follow up.  At that time, he had a scab on the distal incision that was superficial.     He comes back today to check his incision.  He states that it looks better than when he was here before.  They continue to use the fistula without difficulty.  He is working on the process for kidney transplant.    Allergies  Allergen Reactions  . Penicillins Nausea And Vomiting and Other (See Comments)    Has patient had a PCN reaction causing immediate rash, facial/tongue/throat swelling, SOB or lightheadedness with hypotension: No Has patient had a PCN reaction causing severe rash involving mucus membranes or skin necrosis: No Has patient had a PCN reaction that required hospitalization No Has patient had a PCN reaction occurring within the last 10 years: No If all of the above answers are "NO", then may proceed with Cephalosporin use.  . Vioxx [Rofecoxib] Nausea And Vomiting    Current Outpatient Medications  Medication Sig Dispense Refill  . acetaminophen (TYLENOL) 500 MG tablet Take 1,000 mg by mouth every 6 (six) hours as needed for mild pain, moderate pain, fever or headache.     . allopurinol (ZYLOPRIM) 100 MG tablet Take 1 tablet (100 mg total) by mouth daily. (Patient taking differently: Take 100 mg by mouth 2 (two) times daily. ) 30 tablet 2  . amLODipine (NORVASC) 5 MG tablet Take 5 mg by mouth daily.    [name] 1 GM 210 MG(Fe) tablet Take 420 mg by mouth 2 (two) times daily with a meal.   12  . carvedilol (COREG) 6.25 MG tablet Take 6.25 mg by mouth 2 (two) times daily with a meal.    . cinacalcet (SENSIPAR) 90 MG tablet Take 90  mg by mouth every Monday, Wednesday, and Friday.     . diphenhydrAMINE (BENADRYL) 25 MG tablet Take 25 mg by mouth every Monday, Wednesday, and Friday.    . furosemide (LASIX) 40 MG tablet Take 40 mg by mouth daily.    Marland Kitchen gabapentin (NEURONTIN) 100 MG capsule Take 100 mg by mouth 2 (two) times daily.     . Insulin Detemir (LEVEMIR FLEXTOUCH) 100 UNIT/ML Pen Inject 10 Units into the skin at bedtime.     . insulin glargine (LANTUS) 100 UNIT/ML injection Inject into the skin.    Marland Kitchen NOVOLOG FLEXPEN 100 UNIT/ML FlexPen Inject 10 Units into the skin 3 (three) times daily with meals. (Patient taking differently: Inject 3 Units into the skin 2 (two) times daily. ) 15 mL 6  . oxyCODONE-acetaminophen (PERCOCET/ROXICET) 5-325 MG tablet Take 1 tablet by mouth every 6 (six) hours as needed. (Patient not taking: Reported on 09/13/2018) 10 tablet 0  . pravastatin (PRAVACHOL) 20 MG tablet Take 20 mg by mouth daily.      No current facility-administered medications for this visit.      ROS:  See HPI  Physical Exam:  There were no vitals filed for this visit.   Incision:  The scab on the distal incision has healed.  The skin is still thin around the distal  incision.  Extremities:  +thrill within the fistula and easily palpable.   +palpable radial pulse.    Assessment/Plan:  This is a 62 y.o. male who is s/p: excision of eschar overlying right forearm fistula by Dr. Scot Dock on 08/08/18  -the pt's distal incision has healed nicely and the scab has healed.   -recommend avoiding the distal incision for at least a couple of months  -he may start going to water aerobics -f/u with [facility] as needed.   Leontine Locket, PA-C [facility] [phone]  Clinic MD:  Pt seen with Dr. Donnetta Hutching

## 2018-09-20 ENCOUNTER — Ambulatory Visit: Payer: Medicare Other

## 2018-09-20 DIAGNOSIS — E119 Type 2 diabetes mellitus without complications: Secondary | ICD-10-CM | POA: Diagnosis not present

## 2018-09-20 DIAGNOSIS — N186 End stage renal disease: Secondary | ICD-10-CM | POA: Diagnosis not present

## 2018-09-20 DIAGNOSIS — N2581 Secondary hyperparathyroidism of renal origin: Secondary | ICD-10-CM | POA: Diagnosis not present

## 2018-09-22 DIAGNOSIS — E119 Type 2 diabetes mellitus without complications: Secondary | ICD-10-CM | POA: Diagnosis not present

## 2018-09-22 DIAGNOSIS — N186 End stage renal disease: Secondary | ICD-10-CM | POA: Diagnosis not present

## 2018-09-22 DIAGNOSIS — N2581 Secondary hyperparathyroidism of renal origin: Secondary | ICD-10-CM | POA: Diagnosis not present

## 2018-09-25 DIAGNOSIS — E119 Type 2 diabetes mellitus without complications: Secondary | ICD-10-CM | POA: Diagnosis not present

## 2018-09-25 DIAGNOSIS — N2581 Secondary hyperparathyroidism of renal origin: Secondary | ICD-10-CM | POA: Diagnosis not present

## 2018-09-25 DIAGNOSIS — N186 End stage renal disease: Secondary | ICD-10-CM | POA: Diagnosis not present

## 2018-09-27 DIAGNOSIS — N2581 Secondary hyperparathyroidism of renal origin: Secondary | ICD-10-CM | POA: Diagnosis not present

## 2018-09-27 DIAGNOSIS — N186 End stage renal disease: Secondary | ICD-10-CM | POA: Diagnosis not present

## 2018-09-27 DIAGNOSIS — E119 Type 2 diabetes mellitus without complications: Secondary | ICD-10-CM | POA: Diagnosis not present

## 2018-09-29 DIAGNOSIS — E119 Type 2 diabetes mellitus without complications: Secondary | ICD-10-CM | POA: Diagnosis not present

## 2018-09-29 DIAGNOSIS — N2581 Secondary hyperparathyroidism of renal origin: Secondary | ICD-10-CM | POA: Diagnosis not present

## 2018-09-29 DIAGNOSIS — N186 End stage renal disease: Secondary | ICD-10-CM | POA: Diagnosis not present

## 2018-10-02 DIAGNOSIS — E119 Type 2 diabetes mellitus without complications: Secondary | ICD-10-CM | POA: Diagnosis not present

## 2018-10-02 DIAGNOSIS — N186 End stage renal disease: Secondary | ICD-10-CM | POA: Diagnosis not present

## 2018-10-02 DIAGNOSIS — N2581 Secondary hyperparathyroidism of renal origin: Secondary | ICD-10-CM | POA: Diagnosis not present

## 2018-10-04 DIAGNOSIS — N186 End stage renal disease: Secondary | ICD-10-CM | POA: Diagnosis not present

## 2018-10-04 DIAGNOSIS — E119 Type 2 diabetes mellitus without complications: Secondary | ICD-10-CM | POA: Diagnosis not present

## 2018-10-04 DIAGNOSIS — N2581 Secondary hyperparathyroidism of renal origin: Secondary | ICD-10-CM | POA: Diagnosis not present

## 2018-10-06 DIAGNOSIS — N186 End stage renal disease: Secondary | ICD-10-CM | POA: Diagnosis not present

## 2018-10-06 DIAGNOSIS — N2581 Secondary hyperparathyroidism of renal origin: Secondary | ICD-10-CM | POA: Diagnosis not present

## 2018-10-06 DIAGNOSIS — E119 Type 2 diabetes mellitus without complications: Secondary | ICD-10-CM | POA: Diagnosis not present

## 2018-10-08 DIAGNOSIS — E119 Type 2 diabetes mellitus without complications: Secondary | ICD-10-CM | POA: Diagnosis not present

## 2018-10-08 DIAGNOSIS — N186 End stage renal disease: Secondary | ICD-10-CM | POA: Diagnosis not present

## 2018-10-08 DIAGNOSIS — N2581 Secondary hyperparathyroidism of renal origin: Secondary | ICD-10-CM | POA: Diagnosis not present

## 2018-10-10 ENCOUNTER — Ambulatory Visit: Payer: Medicare Other | Admitting: Podiatry

## 2018-10-10 DIAGNOSIS — N2581 Secondary hyperparathyroidism of renal origin: Secondary | ICD-10-CM | POA: Diagnosis not present

## 2018-10-10 DIAGNOSIS — E119 Type 2 diabetes mellitus without complications: Secondary | ICD-10-CM | POA: Diagnosis not present

## 2018-10-10 DIAGNOSIS — N186 End stage renal disease: Secondary | ICD-10-CM | POA: Diagnosis not present

## 2018-10-13 DIAGNOSIS — N2581 Secondary hyperparathyroidism of renal origin: Secondary | ICD-10-CM | POA: Diagnosis not present

## 2018-10-13 DIAGNOSIS — N186 End stage renal disease: Secondary | ICD-10-CM | POA: Diagnosis not present

## 2018-10-13 DIAGNOSIS — E119 Type 2 diabetes mellitus without complications: Secondary | ICD-10-CM | POA: Diagnosis not present

## 2018-10-15 DIAGNOSIS — E1129 Type 2 diabetes mellitus with other diabetic kidney complication: Secondary | ICD-10-CM | POA: Diagnosis not present

## 2018-10-15 DIAGNOSIS — Z992 Dependence on renal dialysis: Secondary | ICD-10-CM | POA: Diagnosis not present

## 2018-10-15 DIAGNOSIS — N186 End stage renal disease: Secondary | ICD-10-CM | POA: Diagnosis not present

## 2018-10-16 DIAGNOSIS — E119 Type 2 diabetes mellitus without complications: Secondary | ICD-10-CM | POA: Diagnosis not present

## 2018-10-16 DIAGNOSIS — N186 End stage renal disease: Secondary | ICD-10-CM | POA: Diagnosis not present

## 2018-10-16 DIAGNOSIS — N2581 Secondary hyperparathyroidism of renal origin: Secondary | ICD-10-CM | POA: Diagnosis not present

## 2018-10-16 DIAGNOSIS — D509 Iron deficiency anemia, unspecified: Secondary | ICD-10-CM | POA: Diagnosis not present

## 2018-10-17 DIAGNOSIS — N186 End stage renal disease: Secondary | ICD-10-CM | POA: Diagnosis not present

## 2018-10-17 DIAGNOSIS — I10 Essential (primary) hypertension: Secondary | ICD-10-CM | POA: Diagnosis not present

## 2018-10-17 DIAGNOSIS — E7800 Pure hypercholesterolemia, unspecified: Secondary | ICD-10-CM | POA: Diagnosis not present

## 2018-10-17 DIAGNOSIS — E1165 Type 2 diabetes mellitus with hyperglycemia: Secondary | ICD-10-CM | POA: Diagnosis not present

## 2018-10-17 DIAGNOSIS — E1140 Type 2 diabetes mellitus with diabetic neuropathy, unspecified: Secondary | ICD-10-CM | POA: Diagnosis not present

## 2018-10-18 DIAGNOSIS — D509 Iron deficiency anemia, unspecified: Secondary | ICD-10-CM | POA: Diagnosis not present

## 2018-10-18 DIAGNOSIS — N186 End stage renal disease: Secondary | ICD-10-CM | POA: Diagnosis not present

## 2018-10-18 DIAGNOSIS — E119 Type 2 diabetes mellitus without complications: Secondary | ICD-10-CM | POA: Diagnosis not present

## 2018-10-18 DIAGNOSIS — N2581 Secondary hyperparathyroidism of renal origin: Secondary | ICD-10-CM | POA: Diagnosis not present

## 2018-10-19 ENCOUNTER — Encounter: Payer: Self-pay | Admitting: Podiatry

## 2018-10-19 ENCOUNTER — Ambulatory Visit (INDEPENDENT_AMBULATORY_CARE_PROVIDER_SITE_OTHER): Payer: Medicare Other | Admitting: Podiatry

## 2018-10-19 DIAGNOSIS — B351 Tinea unguium: Secondary | ICD-10-CM

## 2018-10-19 DIAGNOSIS — B353 Tinea pedis: Secondary | ICD-10-CM

## 2018-10-19 DIAGNOSIS — M79674 Pain in right toe(s): Secondary | ICD-10-CM | POA: Diagnosis not present

## 2018-10-19 DIAGNOSIS — M79675 Pain in left toe(s): Secondary | ICD-10-CM | POA: Diagnosis not present

## 2018-10-19 MED ORDER — CLOTRIMAZOLE 1 % EX CREA: TOPICAL_CREAM | CUTANEOUS | 2 refills | Status: DC

## 2018-10-19 NOTE — Progress Notes (Signed)
Subjective: [name] presents today with painful, thick toenails 1-5 b/l that he cannot cut and which interfere with daily activities.  Pain is aggravated when wearing enclosed shoe gear.  Today, [name] complains of itching and dry skin under his toes and on the posterior aspect of his heel/lower leg. He denies any blisters or open wounds.  Objective: Vascular Examination: Capillary refill time immmediate x 10 digits Dorsalis pedis and Posterior tibial pulses 1/4 b/l Digital hair x 10 digits absent Skin temperature gradient WNL b/l Mild LE edema b/l ankles/feet  Dermatological Examination: Skin with normal texture and tone b/l  Toenails 1-5 b/l discolored, thick, dystrophic with subungual debris and pain with palpation to nailbeds due to thickness of nails.  Mild dry, scaling noted plantar digits 1-5 b/l and posterior heel  Musculoskeletal: Muscle strength 5/5 to all LE muscle groups  Neurological: Sensation diminished with 10 gram monofilament. Vibratory sensation diminished b/l  Assessment: 1. Painful onychomycosis toenails 1-5 b/l  2. Tinea pedis b/l  Plan: 1. Toenails 1-5 b/l were debrided in length and girth without iatrogenic bleeding. 2. Discussed tinea pedis with pt. Rx sent for Clotrimazole Cream 1% to be applied bid to both feet and between toes for 4 weeks. 3. Patient to continue soft, supportive shoe gear 4. Patient to report any pedal injuries to medical professional immediately. 5. Follow up 3 months. Patient/POA to call should there be a concern in the interim.

## 2018-10-20 DIAGNOSIS — N186 End stage renal disease: Secondary | ICD-10-CM | POA: Diagnosis not present

## 2018-10-20 DIAGNOSIS — N2581 Secondary hyperparathyroidism of renal origin: Secondary | ICD-10-CM | POA: Diagnosis not present

## 2018-10-20 DIAGNOSIS — D509 Iron deficiency anemia, unspecified: Secondary | ICD-10-CM | POA: Diagnosis not present

## 2018-10-20 DIAGNOSIS — E119 Type 2 diabetes mellitus without complications: Secondary | ICD-10-CM | POA: Diagnosis not present

## 2018-10-23 DIAGNOSIS — N2581 Secondary hyperparathyroidism of renal origin: Secondary | ICD-10-CM | POA: Diagnosis not present

## 2018-10-23 DIAGNOSIS — N186 End stage renal disease: Secondary | ICD-10-CM | POA: Diagnosis not present

## 2018-10-23 DIAGNOSIS — D509 Iron deficiency anemia, unspecified: Secondary | ICD-10-CM | POA: Diagnosis not present

## 2018-10-23 DIAGNOSIS — E119 Type 2 diabetes mellitus without complications: Secondary | ICD-10-CM | POA: Diagnosis not present

## 2018-10-25 DIAGNOSIS — N2581 Secondary hyperparathyroidism of renal origin: Secondary | ICD-10-CM | POA: Diagnosis not present

## 2018-10-25 DIAGNOSIS — E119 Type 2 diabetes mellitus without complications: Secondary | ICD-10-CM | POA: Diagnosis not present

## 2018-10-25 DIAGNOSIS — N186 End stage renal disease: Secondary | ICD-10-CM | POA: Diagnosis not present

## 2018-10-25 DIAGNOSIS — D509 Iron deficiency anemia, unspecified: Secondary | ICD-10-CM | POA: Diagnosis not present

## 2018-10-26 DIAGNOSIS — J019 Acute sinusitis, unspecified: Secondary | ICD-10-CM | POA: Diagnosis not present

## 2018-10-27 DIAGNOSIS — N2581 Secondary hyperparathyroidism of renal origin: Secondary | ICD-10-CM | POA: Diagnosis not present

## 2018-10-27 DIAGNOSIS — E119 Type 2 diabetes mellitus without complications: Secondary | ICD-10-CM | POA: Diagnosis not present

## 2018-10-27 DIAGNOSIS — D509 Iron deficiency anemia, unspecified: Secondary | ICD-10-CM | POA: Diagnosis not present

## 2018-10-27 DIAGNOSIS — N186 End stage renal disease: Secondary | ICD-10-CM | POA: Diagnosis not present

## 2018-10-30 DIAGNOSIS — E119 Type 2 diabetes mellitus without complications: Secondary | ICD-10-CM | POA: Diagnosis not present

## 2018-10-30 DIAGNOSIS — N186 End stage renal disease: Secondary | ICD-10-CM | POA: Diagnosis not present

## 2018-10-30 DIAGNOSIS — N2581 Secondary hyperparathyroidism of renal origin: Secondary | ICD-10-CM | POA: Diagnosis not present

## 2018-10-30 DIAGNOSIS — D509 Iron deficiency anemia, unspecified: Secondary | ICD-10-CM | POA: Diagnosis not present

## 2018-11-01 DIAGNOSIS — D509 Iron deficiency anemia, unspecified: Secondary | ICD-10-CM | POA: Diagnosis not present

## 2018-11-01 DIAGNOSIS — N2581 Secondary hyperparathyroidism of renal origin: Secondary | ICD-10-CM | POA: Diagnosis not present

## 2018-11-01 DIAGNOSIS — E119 Type 2 diabetes mellitus without complications: Secondary | ICD-10-CM | POA: Diagnosis not present

## 2018-11-01 DIAGNOSIS — N186 End stage renal disease: Secondary | ICD-10-CM | POA: Diagnosis not present

## 2018-11-03 DIAGNOSIS — E119 Type 2 diabetes mellitus without complications: Secondary | ICD-10-CM | POA: Diagnosis not present

## 2018-11-03 DIAGNOSIS — N2581 Secondary hyperparathyroidism of renal origin: Secondary | ICD-10-CM | POA: Diagnosis not present

## 2018-11-03 DIAGNOSIS — D509 Iron deficiency anemia, unspecified: Secondary | ICD-10-CM | POA: Diagnosis not present

## 2018-11-03 DIAGNOSIS — N186 End stage renal disease: Secondary | ICD-10-CM | POA: Diagnosis not present

## 2018-11-05 DIAGNOSIS — N186 End stage renal disease: Secondary | ICD-10-CM | POA: Diagnosis not present

## 2018-11-05 DIAGNOSIS — N2581 Secondary hyperparathyroidism of renal origin: Secondary | ICD-10-CM | POA: Diagnosis not present

## 2018-11-05 DIAGNOSIS — D509 Iron deficiency anemia, unspecified: Secondary | ICD-10-CM | POA: Diagnosis not present

## 2018-11-05 DIAGNOSIS — E119 Type 2 diabetes mellitus without complications: Secondary | ICD-10-CM | POA: Diagnosis not present

## 2018-11-07 DIAGNOSIS — N186 End stage renal disease: Secondary | ICD-10-CM | POA: Diagnosis not present

## 2018-11-07 DIAGNOSIS — N2581 Secondary hyperparathyroidism of renal origin: Secondary | ICD-10-CM | POA: Diagnosis not present

## 2018-11-07 DIAGNOSIS — E119 Type 2 diabetes mellitus without complications: Secondary | ICD-10-CM | POA: Diagnosis not present

## 2018-11-07 DIAGNOSIS — D509 Iron deficiency anemia, unspecified: Secondary | ICD-10-CM | POA: Diagnosis not present

## 2018-11-10 DIAGNOSIS — N186 End stage renal disease: Secondary | ICD-10-CM | POA: Diagnosis not present

## 2018-11-10 DIAGNOSIS — N2581 Secondary hyperparathyroidism of renal origin: Secondary | ICD-10-CM | POA: Diagnosis not present

## 2018-11-10 DIAGNOSIS — D509 Iron deficiency anemia, unspecified: Secondary | ICD-10-CM | POA: Diagnosis not present

## 2018-11-10 DIAGNOSIS — E119 Type 2 diabetes mellitus without complications: Secondary | ICD-10-CM | POA: Diagnosis not present

## 2018-11-12 DIAGNOSIS — E119 Type 2 diabetes mellitus without complications: Secondary | ICD-10-CM | POA: Diagnosis not present

## 2018-11-12 DIAGNOSIS — N186 End stage renal disease: Secondary | ICD-10-CM | POA: Diagnosis not present

## 2018-11-12 DIAGNOSIS — N2581 Secondary hyperparathyroidism of renal origin: Secondary | ICD-10-CM | POA: Diagnosis not present

## 2018-11-12 DIAGNOSIS — D509 Iron deficiency anemia, unspecified: Secondary | ICD-10-CM | POA: Diagnosis not present

## 2018-11-14 DIAGNOSIS — N186 End stage renal disease: Secondary | ICD-10-CM | POA: Diagnosis not present

## 2018-11-14 DIAGNOSIS — E119 Type 2 diabetes mellitus without complications: Secondary | ICD-10-CM | POA: Diagnosis not present

## 2018-11-14 DIAGNOSIS — N2581 Secondary hyperparathyroidism of renal origin: Secondary | ICD-10-CM | POA: Diagnosis not present

## 2018-11-14 DIAGNOSIS — D509 Iron deficiency anemia, unspecified: Secondary | ICD-10-CM | POA: Diagnosis not present

## 2018-11-15 DIAGNOSIS — Z992 Dependence on renal dialysis: Secondary | ICD-10-CM | POA: Diagnosis not present

## 2018-11-15 DIAGNOSIS — N186 End stage renal disease: Secondary | ICD-10-CM | POA: Diagnosis not present

## 2018-11-15 DIAGNOSIS — E1129 Type 2 diabetes mellitus with other diabetic kidney complication: Secondary | ICD-10-CM | POA: Diagnosis not present

## 2018-11-17 DIAGNOSIS — E119 Type 2 diabetes mellitus without complications: Secondary | ICD-10-CM | POA: Diagnosis not present

## 2018-11-17 DIAGNOSIS — D509 Iron deficiency anemia, unspecified: Secondary | ICD-10-CM | POA: Diagnosis not present

## 2018-11-17 DIAGNOSIS — N2581 Secondary hyperparathyroidism of renal origin: Secondary | ICD-10-CM | POA: Diagnosis not present

## 2018-11-17 DIAGNOSIS — N186 End stage renal disease: Secondary | ICD-10-CM | POA: Diagnosis not present

## 2018-11-20 DIAGNOSIS — N186 End stage renal disease: Secondary | ICD-10-CM | POA: Diagnosis not present

## 2018-11-20 DIAGNOSIS — E119 Type 2 diabetes mellitus without complications: Secondary | ICD-10-CM | POA: Diagnosis not present

## 2018-11-20 DIAGNOSIS — N2581 Secondary hyperparathyroidism of renal origin: Secondary | ICD-10-CM | POA: Diagnosis not present

## 2018-11-20 DIAGNOSIS — D509 Iron deficiency anemia, unspecified: Secondary | ICD-10-CM | POA: Diagnosis not present

## 2018-11-22 DIAGNOSIS — N186 End stage renal disease: Secondary | ICD-10-CM | POA: Diagnosis not present

## 2018-11-22 DIAGNOSIS — D509 Iron deficiency anemia, unspecified: Secondary | ICD-10-CM | POA: Diagnosis not present

## 2018-11-22 DIAGNOSIS — N2581 Secondary hyperparathyroidism of renal origin: Secondary | ICD-10-CM | POA: Diagnosis not present

## 2018-11-22 DIAGNOSIS — E119 Type 2 diabetes mellitus without complications: Secondary | ICD-10-CM | POA: Diagnosis not present

## 2018-11-24 DIAGNOSIS — E119 Type 2 diabetes mellitus without complications: Secondary | ICD-10-CM | POA: Diagnosis not present

## 2018-11-24 DIAGNOSIS — N2581 Secondary hyperparathyroidism of renal origin: Secondary | ICD-10-CM | POA: Diagnosis not present

## 2018-11-24 DIAGNOSIS — D509 Iron deficiency anemia, unspecified: Secondary | ICD-10-CM | POA: Diagnosis not present

## 2018-11-24 DIAGNOSIS — R600 Localized edema: Secondary | ICD-10-CM | POA: Diagnosis not present

## 2018-11-24 DIAGNOSIS — N186 End stage renal disease: Secondary | ICD-10-CM | POA: Diagnosis not present

## 2018-11-27 DIAGNOSIS — N2581 Secondary hyperparathyroidism of renal origin: Secondary | ICD-10-CM | POA: Diagnosis not present

## 2018-11-27 DIAGNOSIS — D509 Iron deficiency anemia, unspecified: Secondary | ICD-10-CM | POA: Diagnosis not present

## 2018-11-27 DIAGNOSIS — N186 End stage renal disease: Secondary | ICD-10-CM | POA: Diagnosis not present

## 2018-11-27 DIAGNOSIS — E119 Type 2 diabetes mellitus without complications: Secondary | ICD-10-CM | POA: Diagnosis not present

## 2018-11-29 ENCOUNTER — Other Ambulatory Visit: Payer: Self-pay | Admitting: Family Medicine

## 2018-11-29 DIAGNOSIS — D509 Iron deficiency anemia, unspecified: Secondary | ICD-10-CM | POA: Diagnosis not present

## 2018-11-29 DIAGNOSIS — N2581 Secondary hyperparathyroidism of renal origin: Secondary | ICD-10-CM | POA: Diagnosis not present

## 2018-11-29 DIAGNOSIS — R600 Localized edema: Secondary | ICD-10-CM

## 2018-11-29 DIAGNOSIS — E119 Type 2 diabetes mellitus without complications: Secondary | ICD-10-CM | POA: Diagnosis not present

## 2018-11-29 DIAGNOSIS — N186 End stage renal disease: Secondary | ICD-10-CM | POA: Diagnosis not present

## 2018-12-01 DIAGNOSIS — E119 Type 2 diabetes mellitus without complications: Secondary | ICD-10-CM | POA: Diagnosis not present

## 2018-12-01 DIAGNOSIS — N186 End stage renal disease: Secondary | ICD-10-CM | POA: Diagnosis not present

## 2018-12-01 DIAGNOSIS — D509 Iron deficiency anemia, unspecified: Secondary | ICD-10-CM | POA: Diagnosis not present

## 2018-12-01 DIAGNOSIS — N2581 Secondary hyperparathyroidism of renal origin: Secondary | ICD-10-CM | POA: Diagnosis not present

## 2018-12-04 DIAGNOSIS — N2581 Secondary hyperparathyroidism of renal origin: Secondary | ICD-10-CM | POA: Diagnosis not present

## 2018-12-04 DIAGNOSIS — E119 Type 2 diabetes mellitus without complications: Secondary | ICD-10-CM | POA: Diagnosis not present

## 2018-12-04 DIAGNOSIS — D509 Iron deficiency anemia, unspecified: Secondary | ICD-10-CM | POA: Diagnosis not present

## 2018-12-04 DIAGNOSIS — N186 End stage renal disease: Secondary | ICD-10-CM | POA: Diagnosis not present

## 2018-12-05 ENCOUNTER — Ambulatory Visit
Admission: RE | Admit: 2018-12-05 | Discharge: 2018-12-05 | Disposition: A | Discharge status: Home / Self Care | Payer: Medicare Other | Source: Ambulatory Visit | Attending: Family Medicine | Admitting: Family Medicine

## 2018-12-05 ENCOUNTER — Other Ambulatory Visit: Payer: Medicare Other

## 2018-12-05 DIAGNOSIS — R600 Localized edema: Secondary | ICD-10-CM

## 2018-12-05 DIAGNOSIS — N289 Disorder of kidney and ureter, unspecified: Secondary | ICD-10-CM | POA: Diagnosis not present

## 2018-12-05 MED ORDER — IOPAMIDOL (ISOVUE-300) INJECTION 61%
125.0000 mL | Freq: Once | INTRAVENOUS | Status: AC | PRN
  Administered 2018-12-05: 125 mL via INTRAVENOUS

## 2018-12-05 MED ORDER — IOHEXOL 300 MG/ML  SOLN
25.0000 mL | Freq: Once | INTRAMUSCULAR | Status: AC | PRN
  Administered 2018-12-05: 25 mL via ORAL

## 2018-12-06 DIAGNOSIS — D509 Iron deficiency anemia, unspecified: Secondary | ICD-10-CM | POA: Diagnosis not present

## 2018-12-06 DIAGNOSIS — N186 End stage renal disease: Secondary | ICD-10-CM | POA: Diagnosis not present

## 2018-12-06 DIAGNOSIS — N2581 Secondary hyperparathyroidism of renal origin: Secondary | ICD-10-CM | POA: Diagnosis not present

## 2018-12-06 DIAGNOSIS — E119 Type 2 diabetes mellitus without complications: Secondary | ICD-10-CM | POA: Diagnosis not present

## 2018-12-07 DIAGNOSIS — N2889 Other specified disorders of kidney and ureter: Secondary | ICD-10-CM | POA: Diagnosis not present

## 2018-12-07 DIAGNOSIS — R600 Localized edema: Secondary | ICD-10-CM | POA: Diagnosis not present

## 2018-12-08 DIAGNOSIS — N186 End stage renal disease: Secondary | ICD-10-CM | POA: Diagnosis not present

## 2018-12-08 DIAGNOSIS — D509 Iron deficiency anemia, unspecified: Secondary | ICD-10-CM | POA: Diagnosis not present

## 2018-12-08 DIAGNOSIS — N2581 Secondary hyperparathyroidism of renal origin: Secondary | ICD-10-CM | POA: Diagnosis not present

## 2018-12-08 DIAGNOSIS — E119 Type 2 diabetes mellitus without complications: Secondary | ICD-10-CM | POA: Diagnosis not present

## 2018-12-11 ENCOUNTER — Other Ambulatory Visit: Payer: Self-pay | Admitting: Family Medicine

## 2018-12-11 DIAGNOSIS — D509 Iron deficiency anemia, unspecified: Secondary | ICD-10-CM | POA: Diagnosis not present

## 2018-12-11 DIAGNOSIS — N2581 Secondary hyperparathyroidism of renal origin: Secondary | ICD-10-CM | POA: Diagnosis not present

## 2018-12-11 DIAGNOSIS — E119 Type 2 diabetes mellitus without complications: Secondary | ICD-10-CM | POA: Diagnosis not present

## 2018-12-11 DIAGNOSIS — N2889 Other specified disorders of kidney and ureter: Secondary | ICD-10-CM

## 2018-12-11 DIAGNOSIS — N186 End stage renal disease: Secondary | ICD-10-CM | POA: Diagnosis not present

## 2018-12-13 DIAGNOSIS — N186 End stage renal disease: Secondary | ICD-10-CM | POA: Diagnosis not present

## 2018-12-13 DIAGNOSIS — N2581 Secondary hyperparathyroidism of renal origin: Secondary | ICD-10-CM | POA: Diagnosis not present

## 2018-12-13 DIAGNOSIS — D509 Iron deficiency anemia, unspecified: Secondary | ICD-10-CM | POA: Diagnosis not present

## 2018-12-13 DIAGNOSIS — E119 Type 2 diabetes mellitus without complications: Secondary | ICD-10-CM | POA: Diagnosis not present

## 2018-12-14 ENCOUNTER — Ambulatory Visit
Admission: RE | Admit: 2018-12-14 | Discharge: 2018-12-14 | Disposition: A | Discharge status: Home / Self Care | Payer: Medicare Other | Source: Ambulatory Visit | Attending: Family Medicine | Admitting: Family Medicine

## 2018-12-14 DIAGNOSIS — N281 Cyst of kidney, acquired: Secondary | ICD-10-CM | POA: Diagnosis not present

## 2018-12-14 DIAGNOSIS — N2889 Other specified disorders of kidney and ureter: Secondary | ICD-10-CM

## 2018-12-15 DIAGNOSIS — D509 Iron deficiency anemia, unspecified: Secondary | ICD-10-CM | POA: Diagnosis not present

## 2018-12-15 DIAGNOSIS — N186 End stage renal disease: Secondary | ICD-10-CM | POA: Diagnosis not present

## 2018-12-15 DIAGNOSIS — N2581 Secondary hyperparathyroidism of renal origin: Secondary | ICD-10-CM | POA: Diagnosis not present

## 2018-12-15 DIAGNOSIS — E119 Type 2 diabetes mellitus without complications: Secondary | ICD-10-CM | POA: Diagnosis not present

## 2018-12-16 DIAGNOSIS — N186 End stage renal disease: Secondary | ICD-10-CM | POA: Diagnosis not present

## 2018-12-16 DIAGNOSIS — E1129 Type 2 diabetes mellitus with other diabetic kidney complication: Secondary | ICD-10-CM | POA: Diagnosis not present

## 2018-12-16 DIAGNOSIS — Z992 Dependence on renal dialysis: Secondary | ICD-10-CM | POA: Diagnosis not present

## 2018-12-18 DIAGNOSIS — D631 Anemia in chronic kidney disease: Secondary | ICD-10-CM | POA: Diagnosis not present

## 2018-12-18 DIAGNOSIS — N186 End stage renal disease: Secondary | ICD-10-CM | POA: Diagnosis not present

## 2018-12-18 DIAGNOSIS — E119 Type 2 diabetes mellitus without complications: Secondary | ICD-10-CM | POA: Diagnosis not present

## 2018-12-18 DIAGNOSIS — D509 Iron deficiency anemia, unspecified: Secondary | ICD-10-CM | POA: Diagnosis not present

## 2018-12-18 DIAGNOSIS — N2581 Secondary hyperparathyroidism of renal origin: Secondary | ICD-10-CM | POA: Diagnosis not present

## 2018-12-20 DIAGNOSIS — D509 Iron deficiency anemia, unspecified: Secondary | ICD-10-CM | POA: Diagnosis not present

## 2018-12-20 DIAGNOSIS — D631 Anemia in chronic kidney disease: Secondary | ICD-10-CM | POA: Diagnosis not present

## 2018-12-20 DIAGNOSIS — E119 Type 2 diabetes mellitus without complications: Secondary | ICD-10-CM | POA: Diagnosis not present

## 2018-12-20 DIAGNOSIS — N2581 Secondary hyperparathyroidism of renal origin: Secondary | ICD-10-CM | POA: Diagnosis not present

## 2018-12-20 DIAGNOSIS — N186 End stage renal disease: Secondary | ICD-10-CM | POA: Diagnosis not present

## 2018-12-22 DIAGNOSIS — D631 Anemia in chronic kidney disease: Secondary | ICD-10-CM | POA: Diagnosis not present

## 2018-12-22 DIAGNOSIS — N186 End stage renal disease: Secondary | ICD-10-CM | POA: Diagnosis not present

## 2018-12-22 DIAGNOSIS — N2581 Secondary hyperparathyroidism of renal origin: Secondary | ICD-10-CM | POA: Diagnosis not present

## 2018-12-22 DIAGNOSIS — D509 Iron deficiency anemia, unspecified: Secondary | ICD-10-CM | POA: Diagnosis not present

## 2018-12-22 DIAGNOSIS — E119 Type 2 diabetes mellitus without complications: Secondary | ICD-10-CM | POA: Diagnosis not present

## 2018-12-23 ENCOUNTER — Encounter (HOSPITAL_COMMUNITY): Payer: Self-pay | Admitting: *Deleted

## 2018-12-23 ENCOUNTER — Other Ambulatory Visit: Payer: Self-pay

## 2018-12-23 ENCOUNTER — Emergency Department (HOSPITAL_COMMUNITY): Payer: Medicare Other

## 2018-12-23 ENCOUNTER — Emergency Department (HOSPITAL_COMMUNITY)
Admission: EM | Admit: 2018-12-23 | Discharge: 2018-12-23 | Disposition: A | Discharge status: Home / Self Care | Payer: Medicare Other | Attending: Emergency Medicine | Admitting: Emergency Medicine

## 2018-12-23 DIAGNOSIS — M14672 Charcot's joint, left ankle and foot: Secondary | ICD-10-CM | POA: Diagnosis not present

## 2018-12-23 DIAGNOSIS — Z992 Dependence on renal dialysis: Secondary | ICD-10-CM | POA: Insufficient documentation

## 2018-12-23 DIAGNOSIS — Z794 Long term (current) use of insulin: Secondary | ICD-10-CM | POA: Insufficient documentation

## 2018-12-23 DIAGNOSIS — Z87891 Personal history of nicotine dependence: Secondary | ICD-10-CM | POA: Insufficient documentation

## 2018-12-23 DIAGNOSIS — N186 End stage renal disease: Secondary | ICD-10-CM | POA: Insufficient documentation

## 2018-12-23 DIAGNOSIS — M7989 Other specified soft tissue disorders: Secondary | ICD-10-CM | POA: Diagnosis not present

## 2018-12-23 DIAGNOSIS — Z79899 Other long term (current) drug therapy: Secondary | ICD-10-CM | POA: Diagnosis not present

## 2018-12-23 DIAGNOSIS — F1010 Alcohol abuse, uncomplicated: Secondary | ICD-10-CM | POA: Insufficient documentation

## 2018-12-23 DIAGNOSIS — I120 Hypertensive chronic kidney disease with stage 5 chronic kidney disease or end stage renal disease: Secondary | ICD-10-CM | POA: Diagnosis not present

## 2018-12-23 DIAGNOSIS — M25572 Pain in left ankle and joints of left foot: Secondary | ICD-10-CM | POA: Diagnosis present

## 2018-12-23 DIAGNOSIS — E1122 Type 2 diabetes mellitus with diabetic chronic kidney disease: Secondary | ICD-10-CM | POA: Insufficient documentation

## 2018-12-23 MED ORDER — HYDROCODONE-ACETAMINOPHEN 5-325 MG PO TABS: 1.0000 | ORAL_TABLET | Freq: Four times a day (QID) | ORAL | 0 refills | Status: DC | PRN

## 2018-12-23 NOTE — ED Notes (Signed)
PT DISCHARGED. INSTRUCTIONS AND PRESCRIPTION GIVEN. AAOX4. PT IN NO APPARENT DISTRESS OR PAIN. THE OPPORTUNITY TO ASK QUESTIONS WAS PROVIDED. 

## 2018-12-23 NOTE — Discharge Instructions (Addendum)
Use the cam walker keep it on at all times.  Make an appointment to follow-up with orthopedics information provided above.  Take the hydrocodone as needed for pain.

## 2018-12-23 NOTE — ED Provider Notes (Signed)
[facility] DEPT Provider Note   CSN: [id number] Arrival date & time: 12/23/18  1326     History   Chief Complaint Chief Complaint  Patient presents with  . Leg Pain    left  . Leg Swelling    left    HPI [name] is a 63 y.o. male.  Patient with concern for pain and deformity to his left ankle.  He is also had swelling to that leg.  His regular doctor did an ultrasound of that leg in the last week and was negative for any deep vein thrombosis.  Patient without any fall or injury.  Patient is a dialysis patient normally dialyzed Monday Wednesdays and Fridays.  And he did have dialysis on Friday.  Patient also has a history of congestive heart failure.  Hypertension.  Past history of alcohol abuse.  And diabetes.  The ankles been difficult to walk on his been using a cane.     Past Medical History:  Diagnosis Date  . Alcohol abuse   . Congestive heart disease ([place]) 12/2011  . Diabetes mellitus   . GERD (gastroesophageal reflux disease)   . Gout   . Headache   . Hepatitis    being treated for Hepatitis C  . Hyperlipidemia   . Hyperlipidemia   . Hypertension   . Monoclonal gammopathies 02/25/2012  . Obesity   . Pedal edema   . Pneumonia   . Renal insufficiency   . Sleep apnea    uses cpap  . Tobacco abuse     Patient Active Problem List   Diagnosis Date Noted  . Effusion of lower leg joint 07/13/2018  . Absolute anemia 07/13/2018  . Benign essential HTN 07/13/2018  . Carpal tunnel syndrome of right wrist 05/19/2017  . Hand pain, right 05/10/2017  . Gram-neg septicemia ([place])   . Scrotal edema   . ESRD (end stage renal disease) on dialysis ([place])   . Scrotal pain   . Sepsis ([place])   . Acute respiratory failure with hypoxia ([place]) 03/04/2017  . Scrotal swelling 03/04/2017  . Chronic hepatitis C without hepatic coma ([place]) 11/17/2016  . Pulmonary nodule 04/02/2014  . Sleep apnea 04/02/2014  . Allergic rhinitis 04/02/2014  .  End stage renal disease ([place]) 02/13/2014  . Diabetes mellitus type 2, uncontrolled ([place]) 01/21/2014  . PNA (pneumonia) 01/21/2014  . Pedal edema   . Monoclonal gammopathies 02/25/2012  . Obesity 01/10/2012  . Chronic kidney disease 01/10/2012  . Diabetes mellitus ([place]) 01/10/2012  . Hypertension   . Hyperlipidemia   . Gout   . Alcohol abuse   . Tobacco abuse     Past Surgical History:  Procedure Laterality Date  . AV FISTULA PLACEMENT Right 01/10/2014   Procedure: ARTERIOVENOUS (AV) FISTULA CREATION- RIGHT RADIOCEPHALIC ;  Surgeon: Angelia Mould, MD;  Location: [facility];  Service: Vascular;  Laterality: Right;  . COLONOSCOPY    . EYE SURGERY     cataract surgery, bilateral  . Ganglion cyst removal x 2    . INSERTION OF DIALYSIS CATHETER N/A 01/07/2014   Procedure: INSERTION OF DIALYSIS CATHETER;  Surgeon: Conrad Theodore, MD;  Location: [facility];  Service: Vascular;  Laterality: N/A;  . Left knee arthroscopy with generalized joint synovectomy,  01/2006  . LIGATION OF COMPETING BRANCHES OF ARTERIOVENOUS FISTULA Right 05/28/2014   Procedure: LIGATION OF COMPETING BRANCHES OF ARTERIOVENOUS FISTULA;  Surgeon: Angelia Mould, MD;  Location: [facility];  Service: Vascular;  Laterality: Right;  . REVISON OF ARTERIOVENOUS FISTULA Right 11/20/1094   Procedure: PLICATION REPAIR OF ARTERIOVENOUS FISTULA PSEUDOANEURYSM;  Surgeon: Serafina Mitchell, MD;  Location: [facility] OR;  Service: Vascular;  Laterality: Right;  . REVISON OF ARTERIOVENOUS FISTULA Right 08/08/2018   Procedure: REVISION PLICATION OF ARTERIOVENOUS FISTULA FOREARM;  Surgeon: Angelia Mould, MD;  Location: [facility];  Service: Vascular;  Laterality: Right;  . SCROTAL EXPLORATION N/A 03/11/2017   Procedure: SCROTUM EXPLORATION AND DEBRIDEMENT;  Surgeon: Franchot Gallo, MD;  Location: [facility];  Service: Urology;  Laterality: N/A;  . SCROTAL EXPLORATION N/A 03/21/2017   Procedure: REPEAT SCROTAL DEBRIDEMENT;  Surgeon: Franchot Gallo, MD;   Location: [facility];  Service: Urology;  Laterality: N/A;  . SHUNTOGRAM Right 05/14/2014   Procedure: FISTULOGRAM;  Surgeon: Serafina Mitchell, MD;  Location: [facility] CATH LAB;  Service: Cardiovascular;  Laterality: Right;  . THROMBECTOMY W/ EMBOLECTOMY Right 07/04/2017   Procedure: EXPLORATION AND THROMBECTOMY ARTERIOVENOUS FISTULA RIGHT ARM;  Surgeon: Rosetta Posner, MD;  Location: [facility] OR;  Service: Vascular;  Laterality: Right;        Home Medications    Prior to Admission medications   Medication Sig Start Date End Date Taking? Authorizing Provider  acetaminophen (TYLENOL) 500 MG tablet Take 1,000 mg by mouth every 6 (six) hours as needed for mild pain, moderate pain, fever or headache.     [provider]  allopurinol (ZYLOPRIM) 100 MG tablet Take 1 tablet (100 mg total) by mouth daily. Patient taking differently: Take 100 mg by mouth 2 (two) times daily.  03/14/17   Rai, Ripudeep K, MD  amLODipine (NORVASC) 5 MG tablet Take 5 mg by mouth daily.    [provider]  AURYXIA 1 GM 210 MG(Fe) tablet Take 420 mg by mouth 2 (two) times daily with a meal.  07/14/18   [provider]  carvedilol (COREG) 6.25 MG tablet Take 6.25 mg by mouth 2 (two) times daily with a meal.    [provider]  cinacalcet (SENSIPAR) 90 MG tablet Take 90 mg by mouth every Monday, Wednesday, and Friday.     [provider]  clotrimazole (LOTRIMIN) 1 % cream Apply to both feet and between toes twice daily 10/19/18   Marzetta Board, DPM  diphenhydrAMINE (BENADRYL) 25 MG tablet Take 25 mg by mouth every Monday, Wednesday, and Friday.    [provider]  furosemide (LASIX) 40 MG tablet Take 40 mg by mouth daily.    [provider]  gabapentin (NEURONTIN) 100 MG capsule Take 100 mg by mouth 2 (two) times daily.     [provider]  HYDROcodone-acetaminophen (NORCO/VICODIN) 5-325 MG tablet Take 1 tablet by mouth every 6 (six) hours as needed for moderate pain.  12/23/18   Fredia Sorrow, MD  Insulin Detemir (LEVEMIR FLEXTOUCH) 100 UNIT/ML Pen Inject 10 Units into the skin at bedtime.     [provider]  insulin glargine (LANTUS) 100 UNIT/ML injection Inject into the skin.    [provider]  NOVOLOG FLEXPEN 100 UNIT/ML FlexPen Inject 10 Units into the skin 3 (three) times daily with meals. Patient taking differently: Inject 3 Units into the skin 2 (two) times daily.  03/14/17   Rai, Vernelle Emerald, MD  oxyCODONE-acetaminophen (PERCOCET/ROXICET) 5-325 MG tablet Take 1 tablet by mouth every 6 (six) hours as needed. 08/08/18   Ulyses Amor, PA-C  pravastatin (PRAVACHOL) 20 MG tablet Take 20 mg by mouth daily.     [provider]    Family History Family History  Problem Relation Age of Onset  . Diabetes Mother   . Heart disease Mother   . Lung cancer Father     Social History Social History   Tobacco Use  . Smoking status: Former Smoker    Packs/day: 0.30    Years: 33.00    Pack years: 9.90    Types: Cigarettes    Last attempt to quit: 07/16/2010    Years since quitting: 8.4  . Smokeless tobacco: Never Used  Substance Use Topics  . Alcohol use: No  . Drug use: No     Allergies   Penicillins and Vioxx [rofecoxib]   Review of Systems Review of Systems  Constitutional: Negative for chills and fever.  HENT: Negative for rhinorrhea and sore throat.   Eyes: Negative for visual disturbance.  Respiratory: Negative for cough and shortness of breath.   Cardiovascular: Positive for leg swelling. Negative for chest pain.  Gastrointestinal: Negative for abdominal pain, diarrhea, nausea and vomiting.  Genitourinary: Negative for dysuria.  Musculoskeletal: Positive for arthralgias and joint swelling. Negative for back pain and neck pain.  Skin: Negative for rash.  Neurological: Negative for dizziness, light-headedness and headaches.  Hematological: Does not bruise/bleed easily.  Psychiatric/Behavioral: Negative for  confusion.     Physical Exam Updated Vital Signs BP (!) 168/95 (BP Location: Left Arm)   Pulse 65   Temp 98.3 F (36.8 C) (Oral)   Resp 16   Ht 1.803 m (5\' 11" )   Wt (!) 140.6 kg   SpO2 95%   BMI 43.24 kg/m   Physical Exam Vitals signs and nursing note reviewed.  Constitutional:      Appearance: He is well-developed.  HENT:     Head: Normocephalic and atraumatic.     Mouth/Throat:     Mouth: Mucous membranes are moist.  Eyes:     Conjunctiva/sclera: Conjunctivae normal.  Neck:     Musculoskeletal: Neck supple.  Cardiovascular:     Rate and Rhythm: Normal rate and regular rhythm.     Heart sounds: No murmur.  Pulmonary:     Effort: Pulmonary effort is normal. No respiratory distress.     Breath sounds: Normal breath sounds.  Abdominal:     Palpations: Abdomen is soft.     Tenderness: There is no abdominal tenderness.  Musculoskeletal:        General: Swelling and deformity present.     Comments: AV fistula good thrill right arm.  The left leg with swelling from the knee down.  Is warm good cap refill.  Deformity to the ankle.  Right leg without as much swelling.  Skin:    General: Skin is warm and dry.  Neurological:     General: No focal deficit present.     Mental Status: He is alert and oriented to person, place, and time.      ED Treatments / Results  Labs (all labs ordered are listed, but only abnormal results are displayed) Labs Reviewed - No data to display  EKG None  Radiology Dg Ankle Complete Left  Result Date: 12/23/2018 CLINICAL DATA:  Popping sound at LEFT ankle with walking, sometimes does not feel the bottom of his foot, history of diabetes mellitus EXAM: LEFT ANKLE COMPLETE - 3+ VIEW COMPARISON:  LEFT foot radiographs 08/25/2016 FINDINGS: Diffuse soft tissue swelling of the lower leg, ankle and into foot. Bones appear mildly demineralized. Extensive dystrophic soft tissue calcifications at the lower leg. Bone destruction and  collapse of  anterior portion of calcaneus consistent with Charcot change. This extends into and involves the anterior subtalar joint and the calcaneocuboid joint. Additional bony fragment lateral to the distal fibula cranial to the posterior calcaneus on the oblique view. Ankle joint space preserved. No additional fracture, dislocation or bone destruction. Extensive small vessel vascular calcifications. IMPRESSION: Lab destruction involving the anterior portion of the calcaneus extending into the anterior subtalar joint and calcaneocuboid joint compatible with advanced Charcot changes. Extensive soft tissue swelling of the lower leg and foot with significant dystrophic soft tissue calcifications at the distal LEFT lower leg. Electronically Signed   By: Lavonia Dana M.D.   On: 12/23/2018 21:53    Procedures Procedures (including critical care time)  Medications Ordered in ED Medications - No data to display   Initial Impression / Assessment and Plan / ED Course  I have reviewed the triage vital signs and the nursing notes.  Pertinent labs & imaging results that were available during my care of the patient were reviewed by me and considered in my medical decision making (see chart for details).     Patient deformity to the left ankle was due to Charcot deformity.  Discussed with Dr. Reynaldo Minium.  Recommends Cam Walker the follow-up in the office.  Also as stated before patient states that he had ultrasound to rule out DVT in that leg this past week and it was negative.  Unable to find that report said it was done outside our system.  Patient is a dialysis patient normally dialyzed Monday Wednesdays and Fridays.  He was dialyzed on Friday.  He has no concerns from a dialysis standpoint.  Patient also given hydrocodone to help with the pain.  Final Clinical Impressions(s) / ED Diagnoses   Final diagnoses:  Charcot's joint of ankle, left    ED Discharge Orders         Ordered    HYDROcodone-acetaminophen  (NORCO/VICODIN) 5-325 MG tablet  Every 6 hours PRN     12/23/18 2331           Fredia Sorrow, MD 12/23/18 2343

## 2018-12-23 NOTE — ED Triage Notes (Signed)
Pt reports leg swelling and pain x 1 month.  Pt reports that recently when he walks he hears a "popping" sound where the bone his on his ankle. Pt denies SOB. Pt states that sometime he has not feeling the bottom on his foot.  Pt states he is a diabetic. Pt is a dialysis pt and gets dialysis on MWF. Pt ambulates with a cane in triage.

## 2018-12-25 ENCOUNTER — Other Ambulatory Visit: Payer: Self-pay

## 2018-12-25 ENCOUNTER — Telehealth: Payer: Self-pay

## 2018-12-25 DIAGNOSIS — N2581 Secondary hyperparathyroidism of renal origin: Secondary | ICD-10-CM | POA: Diagnosis not present

## 2018-12-25 DIAGNOSIS — E119 Type 2 diabetes mellitus without complications: Secondary | ICD-10-CM | POA: Diagnosis not present

## 2018-12-25 DIAGNOSIS — D631 Anemia in chronic kidney disease: Secondary | ICD-10-CM | POA: Diagnosis not present

## 2018-12-25 DIAGNOSIS — N186 End stage renal disease: Secondary | ICD-10-CM | POA: Diagnosis not present

## 2018-12-25 DIAGNOSIS — D509 Iron deficiency anemia, unspecified: Secondary | ICD-10-CM | POA: Diagnosis not present

## 2018-12-25 DIAGNOSIS — R609 Edema, unspecified: Secondary | ICD-10-CM

## 2018-12-25 NOTE — Telephone Encounter (Signed)
Pt's wife called asking if pt should keep his appt, as he was seen in ED this weekend for left foot joint pain. It does not appear any blood flow studies were performed, so I recommended that he keep his appt but that it ultimately their decision. She said she wanted pt to still be seen. No further questions at this time.

## 2018-12-26 ENCOUNTER — Telehealth: Payer: Self-pay | Admitting: Podiatry

## 2018-12-26 NOTE — Telephone Encounter (Signed)
I'm currently a pt there and I need someone to call me back about some stuff I need done.

## 2018-12-27 ENCOUNTER — Telehealth: Payer: Self-pay | Admitting: Podiatry

## 2018-12-27 DIAGNOSIS — D509 Iron deficiency anemia, unspecified: Secondary | ICD-10-CM | POA: Diagnosis not present

## 2018-12-27 DIAGNOSIS — N186 End stage renal disease: Secondary | ICD-10-CM | POA: Diagnosis not present

## 2018-12-27 DIAGNOSIS — D631 Anemia in chronic kidney disease: Secondary | ICD-10-CM | POA: Diagnosis not present

## 2018-12-27 DIAGNOSIS — E119 Type 2 diabetes mellitus without complications: Secondary | ICD-10-CM | POA: Diagnosis not present

## 2018-12-27 DIAGNOSIS — N2581 Secondary hyperparathyroidism of renal origin: Secondary | ICD-10-CM | POA: Diagnosis not present

## 2018-12-27 NOTE — Telephone Encounter (Signed)
I called pt and informed that our doctors should evaluate him prior to offering specific treatments and would need imaging and an appt. Pt states understanding and I transferred to scheduler.

## 2018-12-27 NOTE — Telephone Encounter (Signed)
Left message informing pt, leave his request on my voicemail, I would contact him again with more information.

## 2018-12-27 NOTE — Telephone Encounter (Signed)
I went to the hospital on Saturday and was told I have deteriorating bones in my ankle. Is this something you can treat me for? If you could please call me back.

## 2018-12-29 DIAGNOSIS — E119 Type 2 diabetes mellitus without complications: Secondary | ICD-10-CM | POA: Diagnosis not present

## 2018-12-29 DIAGNOSIS — M14672 Charcot's joint, left ankle and foot: Secondary | ICD-10-CM | POA: Diagnosis not present

## 2018-12-29 DIAGNOSIS — N186 End stage renal disease: Secondary | ICD-10-CM | POA: Diagnosis not present

## 2018-12-29 DIAGNOSIS — M79672 Pain in left foot: Secondary | ICD-10-CM | POA: Diagnosis not present

## 2018-12-29 DIAGNOSIS — D631 Anemia in chronic kidney disease: Secondary | ICD-10-CM | POA: Diagnosis not present

## 2018-12-29 DIAGNOSIS — D509 Iron deficiency anemia, unspecified: Secondary | ICD-10-CM | POA: Diagnosis not present

## 2018-12-29 DIAGNOSIS — N2581 Secondary hyperparathyroidism of renal origin: Secondary | ICD-10-CM | POA: Diagnosis not present

## 2019-01-01 DIAGNOSIS — E119 Type 2 diabetes mellitus without complications: Secondary | ICD-10-CM | POA: Diagnosis not present

## 2019-01-01 DIAGNOSIS — N2581 Secondary hyperparathyroidism of renal origin: Secondary | ICD-10-CM | POA: Diagnosis not present

## 2019-01-01 DIAGNOSIS — D509 Iron deficiency anemia, unspecified: Secondary | ICD-10-CM | POA: Diagnosis not present

## 2019-01-01 DIAGNOSIS — D631 Anemia in chronic kidney disease: Secondary | ICD-10-CM | POA: Diagnosis not present

## 2019-01-01 DIAGNOSIS — N186 End stage renal disease: Secondary | ICD-10-CM | POA: Diagnosis not present

## 2019-01-02 ENCOUNTER — Ambulatory Visit: Payer: Medicare Other | Admitting: Vascular Surgery

## 2019-01-02 ENCOUNTER — Encounter (HOSPITAL_COMMUNITY): Payer: Medicare Other

## 2019-01-03 DIAGNOSIS — D509 Iron deficiency anemia, unspecified: Secondary | ICD-10-CM | POA: Diagnosis not present

## 2019-01-03 DIAGNOSIS — N2581 Secondary hyperparathyroidism of renal origin: Secondary | ICD-10-CM | POA: Diagnosis not present

## 2019-01-03 DIAGNOSIS — E119 Type 2 diabetes mellitus without complications: Secondary | ICD-10-CM | POA: Diagnosis not present

## 2019-01-03 DIAGNOSIS — N186 End stage renal disease: Secondary | ICD-10-CM | POA: Diagnosis not present

## 2019-01-03 DIAGNOSIS — D631 Anemia in chronic kidney disease: Secondary | ICD-10-CM | POA: Diagnosis not present

## 2019-01-05 DIAGNOSIS — N2581 Secondary hyperparathyroidism of renal origin: Secondary | ICD-10-CM | POA: Diagnosis not present

## 2019-01-05 DIAGNOSIS — D509 Iron deficiency anemia, unspecified: Secondary | ICD-10-CM | POA: Diagnosis not present

## 2019-01-05 DIAGNOSIS — D631 Anemia in chronic kidney disease: Secondary | ICD-10-CM | POA: Diagnosis not present

## 2019-01-05 DIAGNOSIS — E119 Type 2 diabetes mellitus without complications: Secondary | ICD-10-CM | POA: Diagnosis not present

## 2019-01-05 DIAGNOSIS — N186 End stage renal disease: Secondary | ICD-10-CM | POA: Diagnosis not present

## 2019-01-08 DIAGNOSIS — E119 Type 2 diabetes mellitus without complications: Secondary | ICD-10-CM | POA: Diagnosis not present

## 2019-01-08 DIAGNOSIS — D509 Iron deficiency anemia, unspecified: Secondary | ICD-10-CM | POA: Diagnosis not present

## 2019-01-08 DIAGNOSIS — D631 Anemia in chronic kidney disease: Secondary | ICD-10-CM | POA: Diagnosis not present

## 2019-01-08 DIAGNOSIS — N2581 Secondary hyperparathyroidism of renal origin: Secondary | ICD-10-CM | POA: Diagnosis not present

## 2019-01-08 DIAGNOSIS — N186 End stage renal disease: Secondary | ICD-10-CM | POA: Diagnosis not present

## 2019-01-10 DIAGNOSIS — N2581 Secondary hyperparathyroidism of renal origin: Secondary | ICD-10-CM | POA: Diagnosis not present

## 2019-01-10 DIAGNOSIS — D509 Iron deficiency anemia, unspecified: Secondary | ICD-10-CM | POA: Diagnosis not present

## 2019-01-10 DIAGNOSIS — N186 End stage renal disease: Secondary | ICD-10-CM | POA: Diagnosis not present

## 2019-01-10 DIAGNOSIS — E119 Type 2 diabetes mellitus without complications: Secondary | ICD-10-CM | POA: Diagnosis not present

## 2019-01-10 DIAGNOSIS — D631 Anemia in chronic kidney disease: Secondary | ICD-10-CM | POA: Diagnosis not present

## 2019-01-11 DIAGNOSIS — M14672 Charcot's joint, left ankle and foot: Secondary | ICD-10-CM | POA: Diagnosis not present

## 2019-01-12 DIAGNOSIS — D631 Anemia in chronic kidney disease: Secondary | ICD-10-CM | POA: Diagnosis not present

## 2019-01-12 DIAGNOSIS — N186 End stage renal disease: Secondary | ICD-10-CM | POA: Diagnosis not present

## 2019-01-12 DIAGNOSIS — E119 Type 2 diabetes mellitus without complications: Secondary | ICD-10-CM | POA: Diagnosis not present

## 2019-01-12 DIAGNOSIS — N2581 Secondary hyperparathyroidism of renal origin: Secondary | ICD-10-CM | POA: Diagnosis not present

## 2019-01-12 DIAGNOSIS — D509 Iron deficiency anemia, unspecified: Secondary | ICD-10-CM | POA: Diagnosis not present

## 2019-01-14 DIAGNOSIS — Z992 Dependence on renal dialysis: Secondary | ICD-10-CM | POA: Diagnosis not present

## 2019-01-14 DIAGNOSIS — E1129 Type 2 diabetes mellitus with other diabetic kidney complication: Secondary | ICD-10-CM | POA: Diagnosis not present

## 2019-01-14 DIAGNOSIS — N186 End stage renal disease: Secondary | ICD-10-CM | POA: Diagnosis not present

## 2019-01-15 DIAGNOSIS — D631 Anemia in chronic kidney disease: Secondary | ICD-10-CM | POA: Diagnosis not present

## 2019-01-15 DIAGNOSIS — N2581 Secondary hyperparathyroidism of renal origin: Secondary | ICD-10-CM | POA: Diagnosis not present

## 2019-01-15 DIAGNOSIS — D509 Iron deficiency anemia, unspecified: Secondary | ICD-10-CM | POA: Diagnosis not present

## 2019-01-15 DIAGNOSIS — N186 End stage renal disease: Secondary | ICD-10-CM | POA: Diagnosis not present

## 2019-01-15 DIAGNOSIS — E119 Type 2 diabetes mellitus without complications: Secondary | ICD-10-CM | POA: Diagnosis not present

## 2019-01-17 DIAGNOSIS — D509 Iron deficiency anemia, unspecified: Secondary | ICD-10-CM | POA: Diagnosis not present

## 2019-01-17 DIAGNOSIS — E119 Type 2 diabetes mellitus without complications: Secondary | ICD-10-CM | POA: Diagnosis not present

## 2019-01-17 DIAGNOSIS — N2581 Secondary hyperparathyroidism of renal origin: Secondary | ICD-10-CM | POA: Diagnosis not present

## 2019-01-17 DIAGNOSIS — D631 Anemia in chronic kidney disease: Secondary | ICD-10-CM | POA: Diagnosis not present

## 2019-01-17 DIAGNOSIS — N186 End stage renal disease: Secondary | ICD-10-CM | POA: Diagnosis not present

## 2019-01-17 IMAGING — US US ART/VEN ABD/PELV/SCROTUM DOPPLER LTD
1 series · 13 of 25 positions shown · non-contrast
Comparison: No recent prior.

CLINICAL DATA: Scrotal pain and swelling.

EXAM:
SCROTAL ULTRASOUND
DOPPLER ULTRASOUND OF THE TESTICLES
TECHNIQUE: Complete ultrasound examination of the testicles, epididymis, and
other scrotal structures was performed. Color and spectral Doppler
ultrasound were also utilized to evaluate blood flow to the
testicles.

[Series 1: us art/ven abd/pelv/scrotum doppler ltd · 0.06mm/px · 78 acquisitions, 13 frames shown]
[im 1/78]
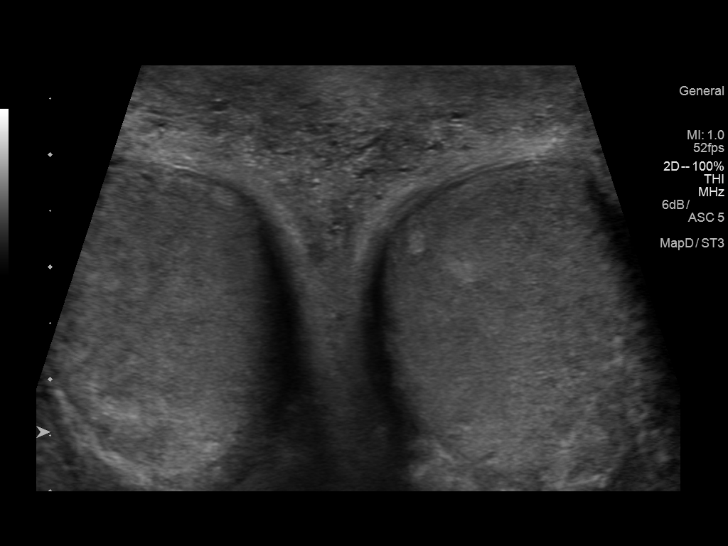
[im 7/78]
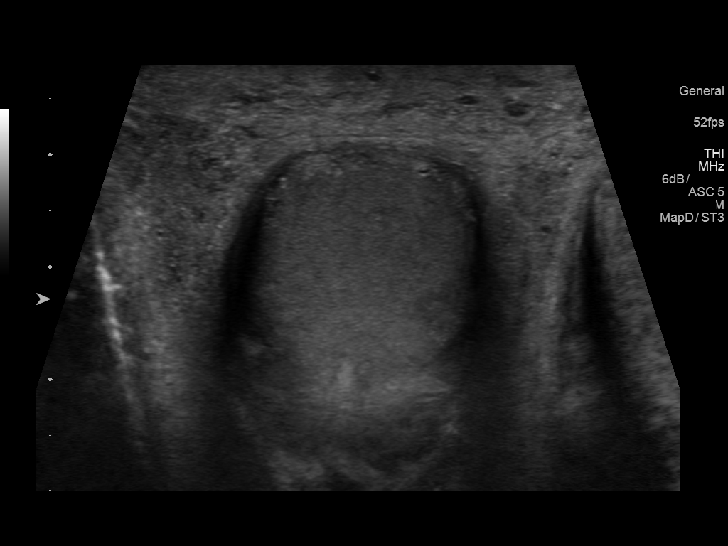
[im 13/78]
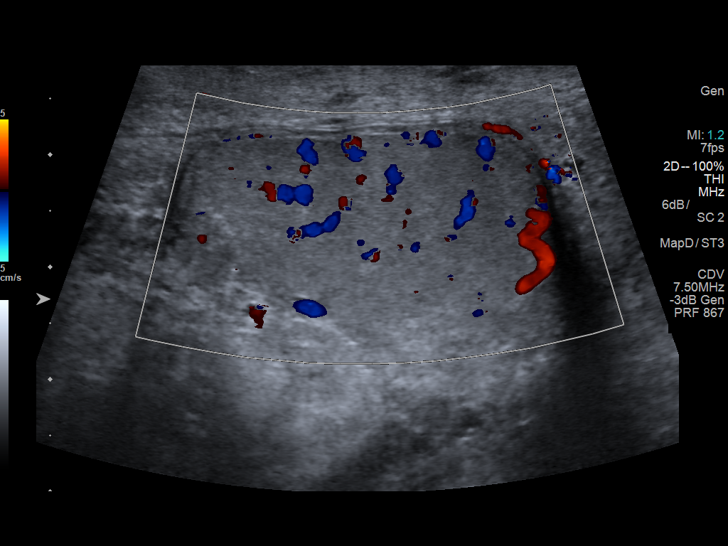
[im 20/78]
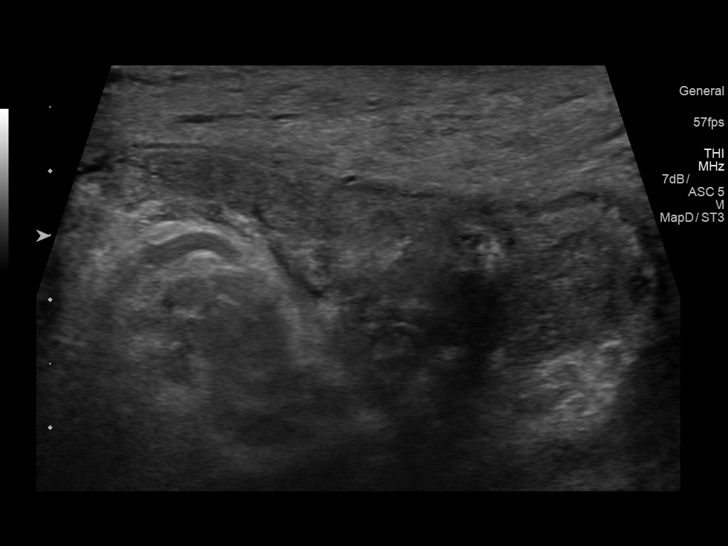
[im 26/78]
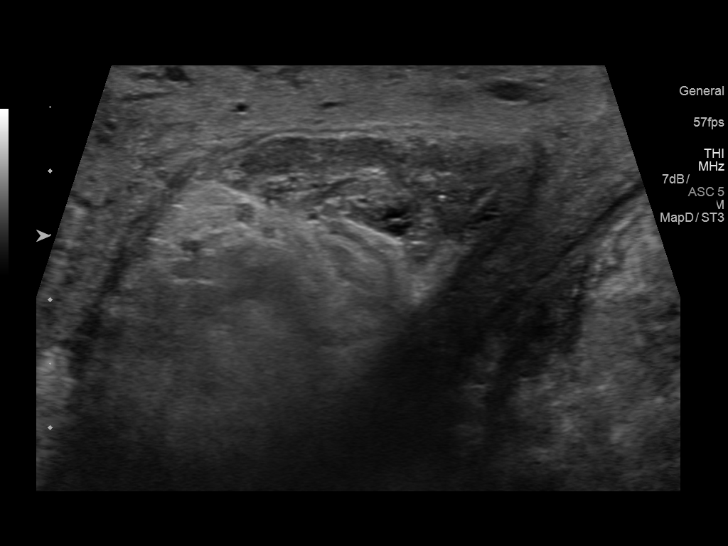
[im 33/78]
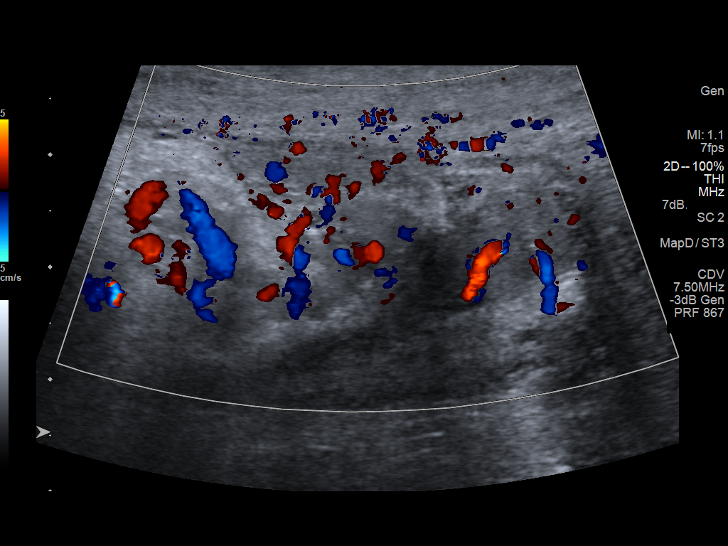
[im 39/78]
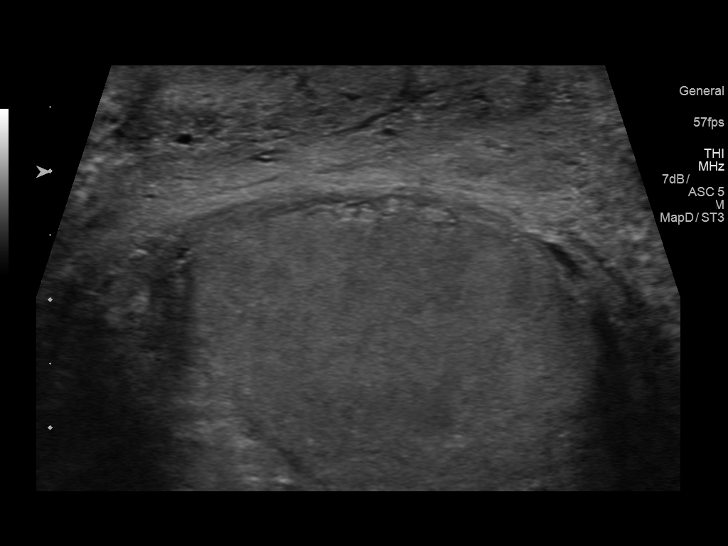
[im 45/78]
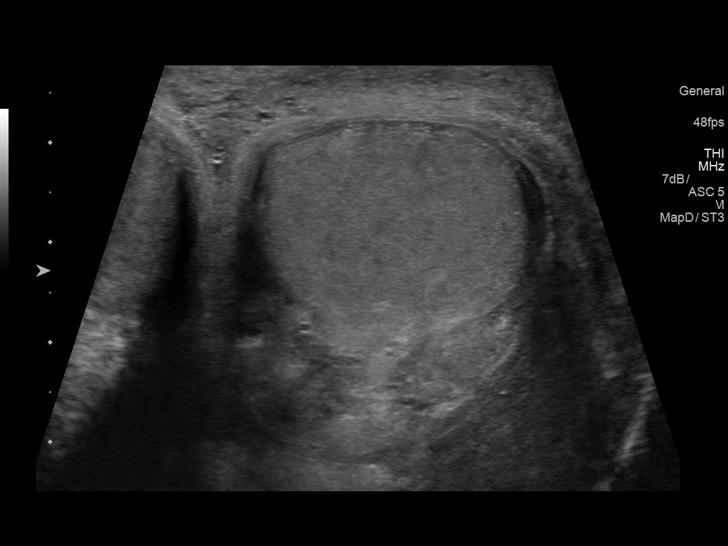
[im 52/78]
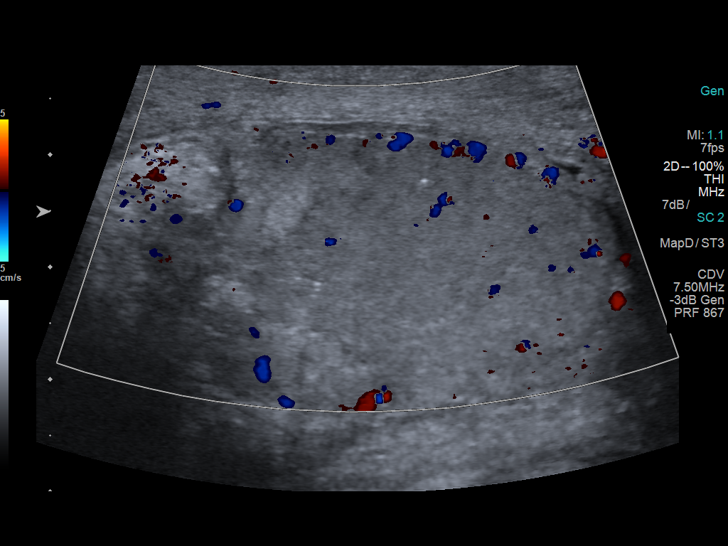
[im 58/78]
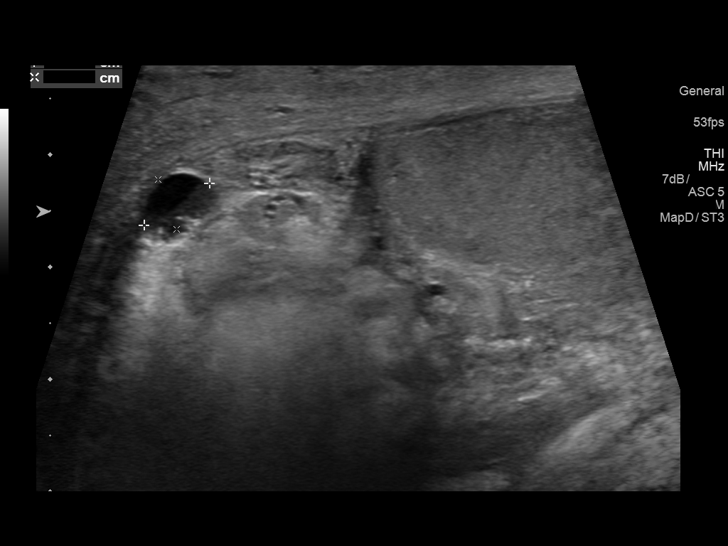
[im 65/78]
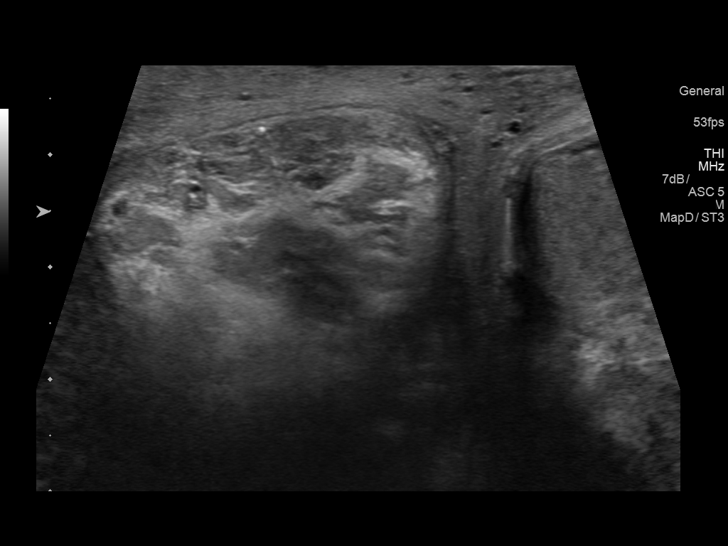
[im 71/78]
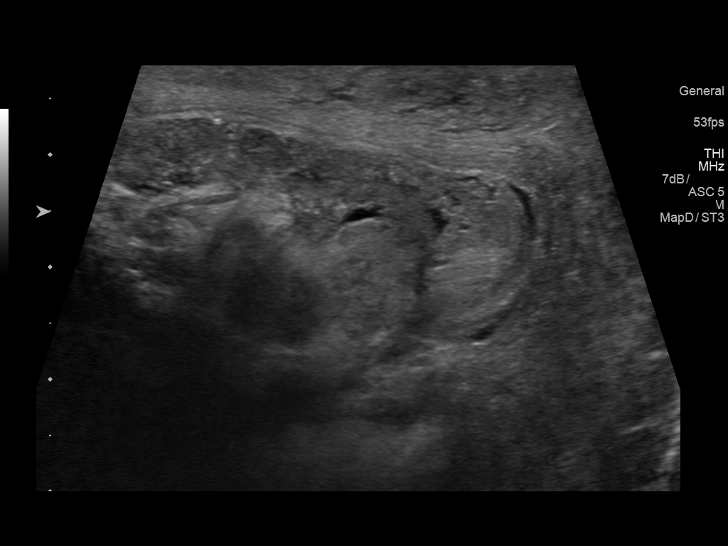
[im 78/78]
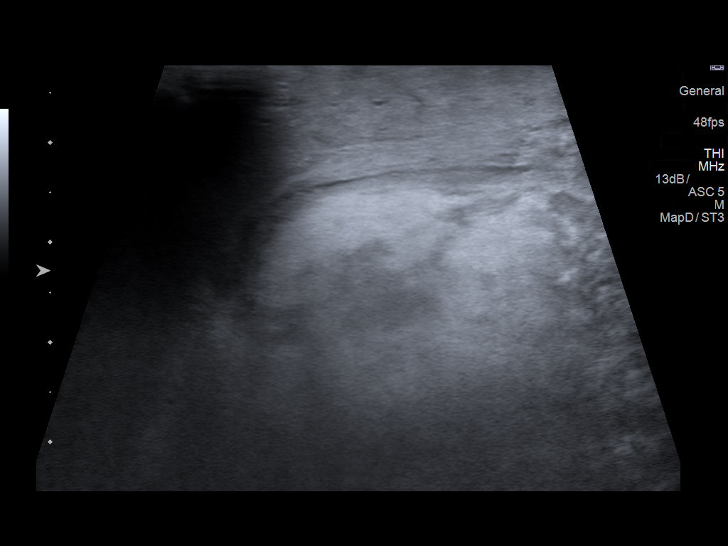

[13 of 25 positions shown; findings below may reference images not displayed]

FINDINGS: Right testicle

Measurements: 3.4 x 2.0 x 2.5 cm. No mass or microlithiasis
visualized.

Left testicle

Measurements: 3.7 x 2.4 x 2.8 cm. 0.6 x 0.3 x 0.4 small area of
increased echogenicity noted in the left testicle. Although this may
represent a focal area of fat or a at left testicular tumor cannot
be completely excluded. No microlithiasis visualized .

Right epididymis:  Tail of the epididymis is prominent.

Left epididymis: Prominent epididymis with heterogeneous
echotexture. 7 mm epididymal cyst.

Hydrocele:  None visualized.

Varicocele: Could not perform Valsalva maneuver due to patient's
clinical condition.

Bilateral scrotal skin thickening.

Pulsed Doppler interrogation of both testes demonstrates normal low
resistance arterial and venous waveforms bilaterally.
IMPRESSION: 1. Cannot exclude bilateral epididymitis.

2. Small 0.6 cm area of increased echogenicity noted in the left
testicle. Although this may represent small area of fat a small left
testicular tumor cannot be excluded no evidence of torsion.

## 2019-01-18 ENCOUNTER — Ambulatory Visit: Payer: Medicare Other | Admitting: Podiatry

## 2019-01-19 DIAGNOSIS — D631 Anemia in chronic kidney disease: Secondary | ICD-10-CM | POA: Diagnosis not present

## 2019-01-19 DIAGNOSIS — E119 Type 2 diabetes mellitus without complications: Secondary | ICD-10-CM | POA: Diagnosis not present

## 2019-01-19 DIAGNOSIS — N186 End stage renal disease: Secondary | ICD-10-CM | POA: Diagnosis not present

## 2019-01-19 DIAGNOSIS — N2581 Secondary hyperparathyroidism of renal origin: Secondary | ICD-10-CM | POA: Diagnosis not present

## 2019-01-19 DIAGNOSIS — D509 Iron deficiency anemia, unspecified: Secondary | ICD-10-CM | POA: Diagnosis not present

## 2019-01-21 IMAGING — CT CT ANGIO CHEST
2 of 6 series · 18 of 36 positions shown · IV contrast (ISOVUE 370)
Comparison: Chest x-ray 03/04/2017; prior CT scan of the chest
07/09/2014

CLINICAL DATA: 60-year-old male with increased shortness of breath
and hypoxia following dialysis earlier today.

EXAM:
CT ANGIOGRAPHY CHEST WITH CONTRAST
TECHNIQUE: Multidetector CT imaging of the chest was performed using the
standard protocol during bolus administration of intravenous
contrast. Multiplanar CT image reconstructions and MIPs were
obtained to evaluate the vascular anatomy.
CONTRAST:  100 mL Isovue 370

[Series 6: thins for pacs · axial · 0.75mm/px · z∈[-291,-45]mm · 17 of 274 slices shown]
[im 14/274  lung]
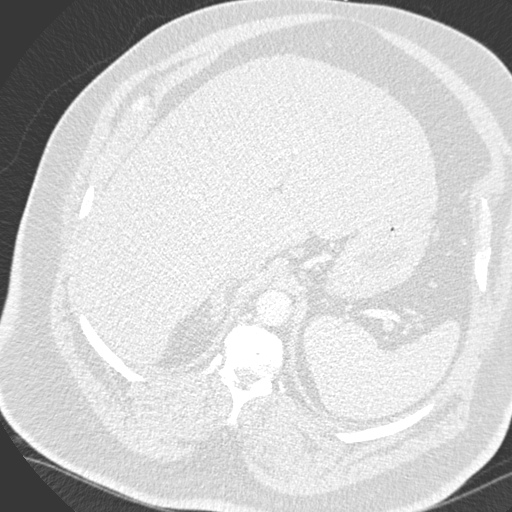
[im 28/274  mediastinal]
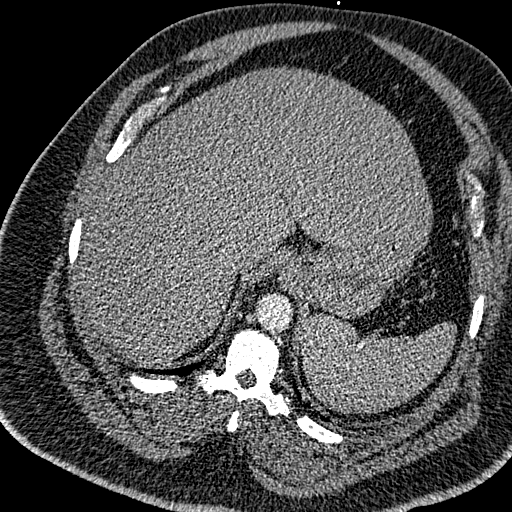
[im 41/274  lung]
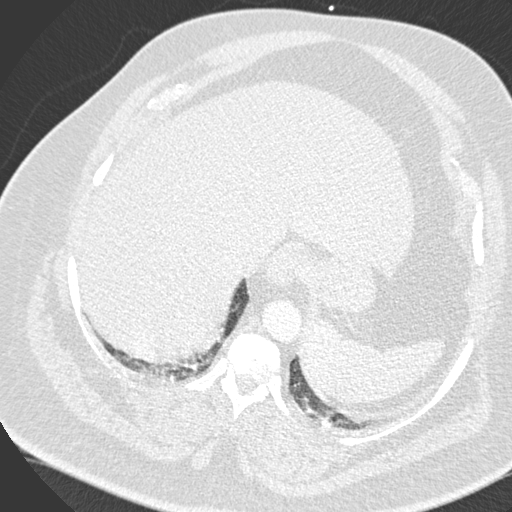
[im 55/274  mediastinal]
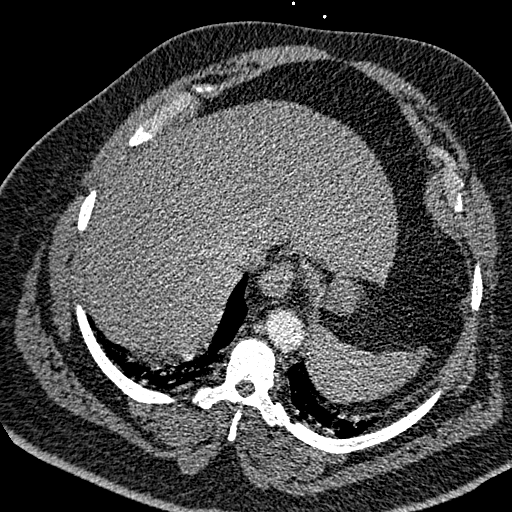
[im 82/274  lung]
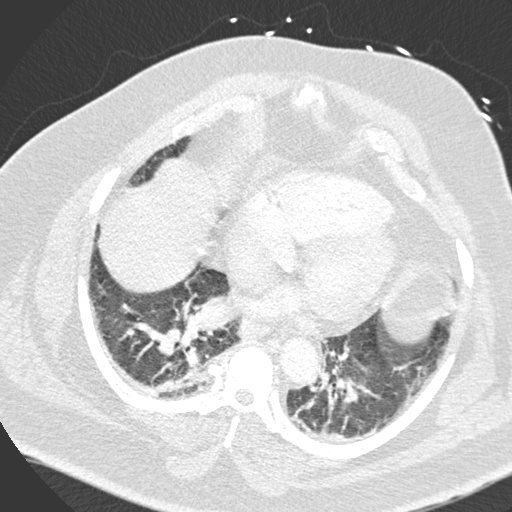
[im 96/274  mediastinal]
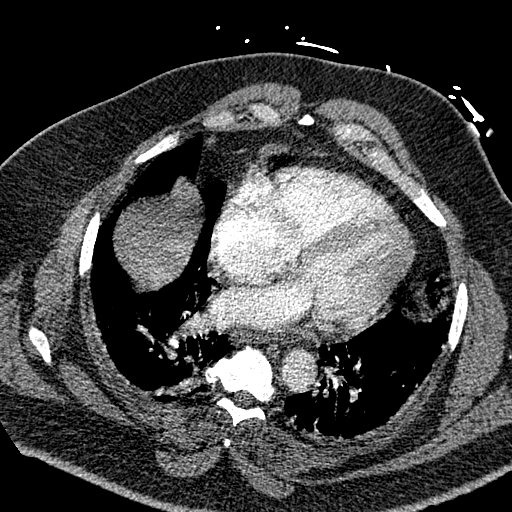
[im 110/274  lung]
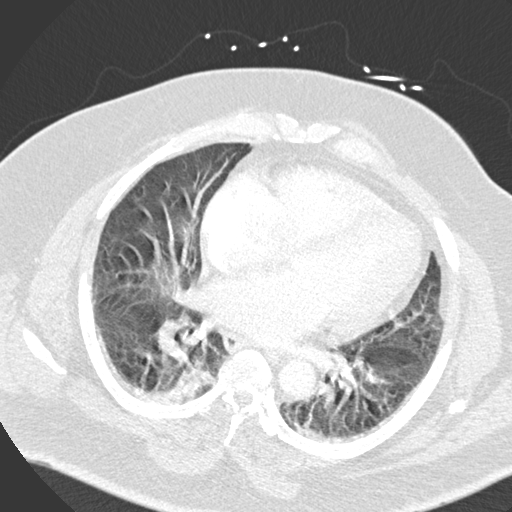
[im 123/274  mediastinal]
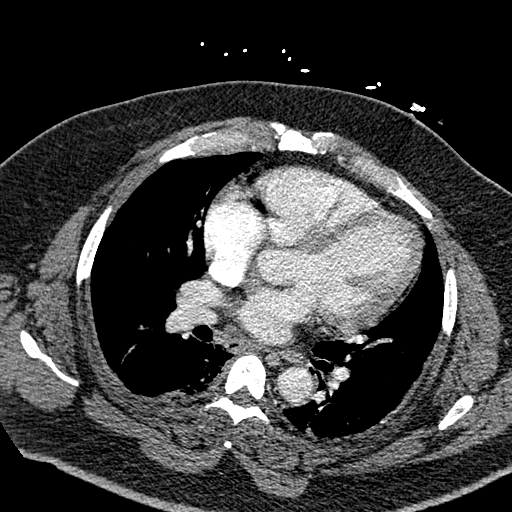
[im 137/274  lung]
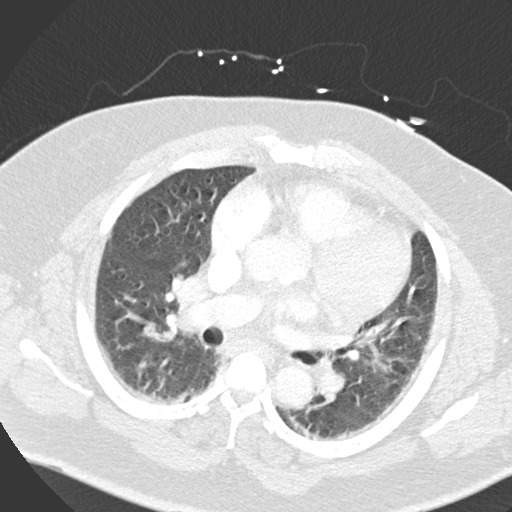
[im 151/274  mediastinal]
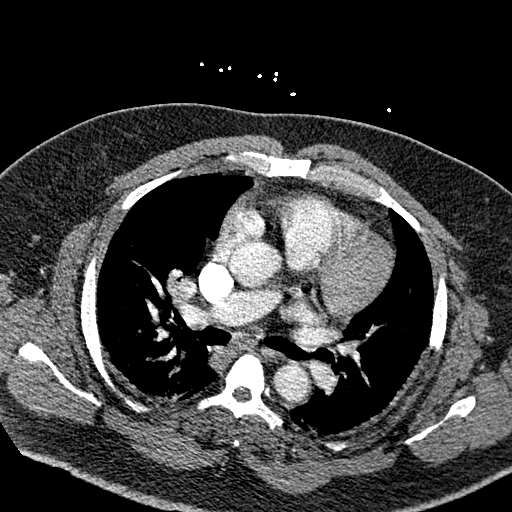
[im 164/274  lung]
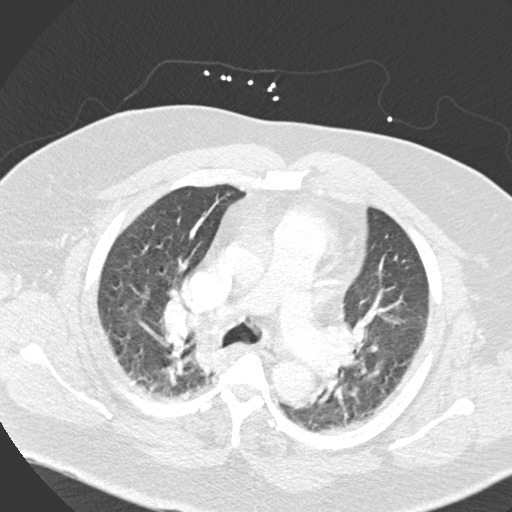
[im 178/274  mediastinal]
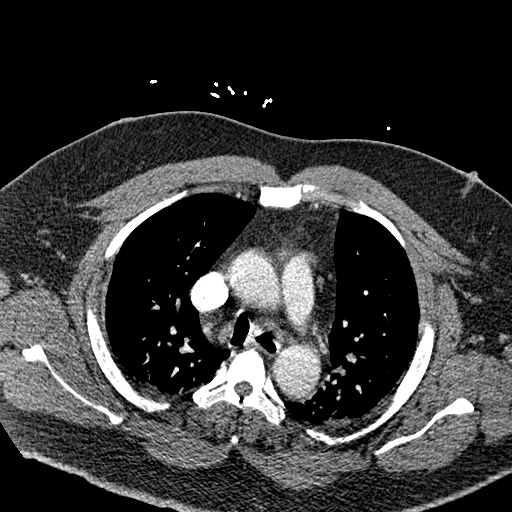
[im 192/274  lung]
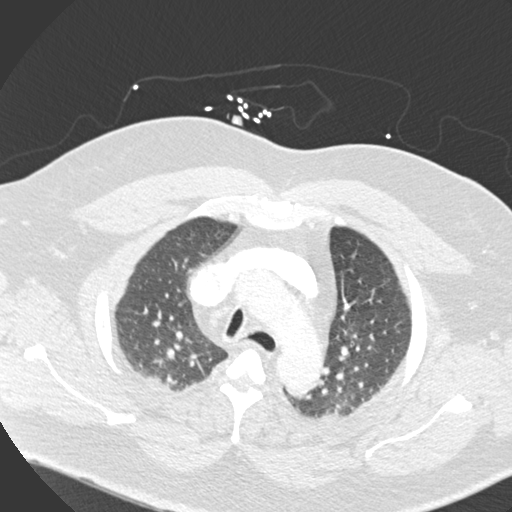
[im 219/274  mediastinal]
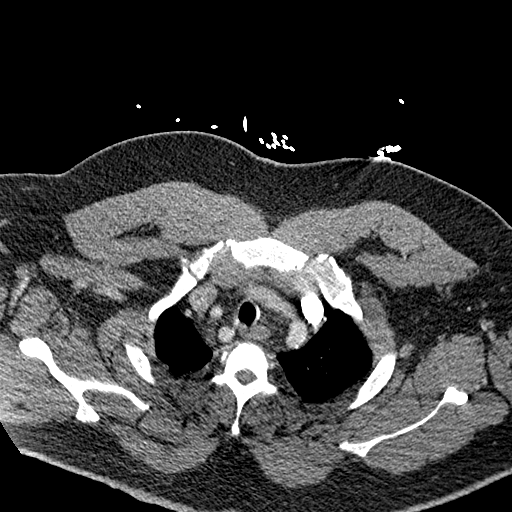
[im 233/274  lung]
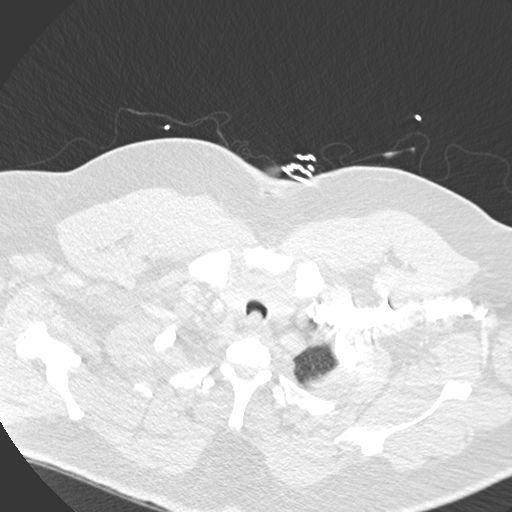
[im 246/274  mediastinal]
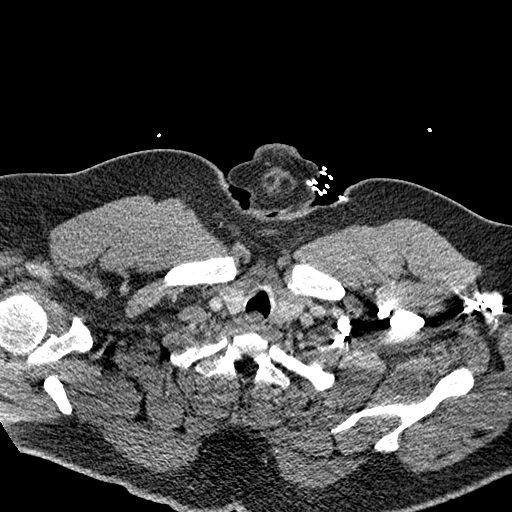
[im 260/274  lung]
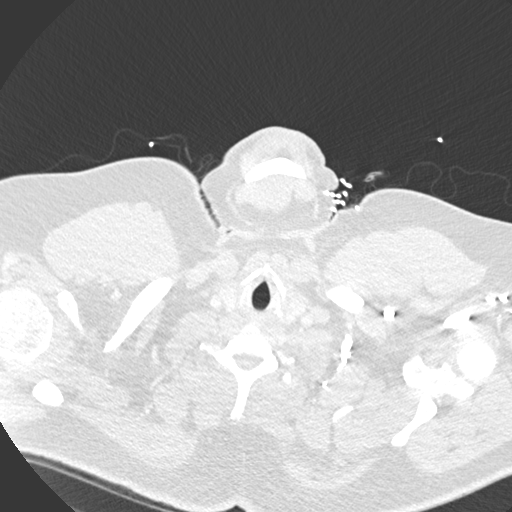

[Series 8: coronal mpr · coronal · 0.54mm/px · 1 of 181 slices shown]
[im 91/181  mediastinal]
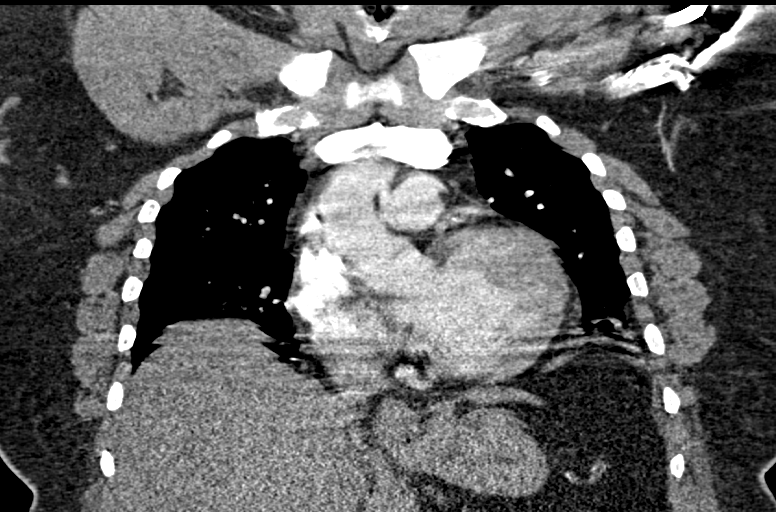

[18 of 36 positions shown; findings below may reference images not displayed]

FINDINGS: Cardiovascular: Limited opacification of the pulmonary arteries
secondary to transient physiologic interruption of contrast. No
central or proximal or lobar PE. No evidence of aortic dissection or
aneurysm. Aberrant right subclavian artery noticed incidentally. The
heart is enlarged. No pericardial effusion.

Mediastinum/Nodes: Unremarkable CT appearance of the thyroid gland.
No suspicious mediastinal or hilar adenopathy. No soft tissue
mediastinal mass. The thoracic esophagus is unremarkable.

Lungs/Pleura: Respiratory motion artifact limits evaluation for
small pulmonary nodules. No focal airspace consolidation, pleural
effusion, pneumothorax or pulmonary edema. Mild dependent
atelectasis present in both lower lobes.

Upper Abdomen: Although the liver is incompletely evaluated, the
left hepatic lobe and caudate lobe appear relatively hypertrophied
which may suggest early cirrhotic change. No acute abnormalities
within the upper abdomen.

Musculoskeletal: No acute fracture or aggressive appearing lytic or
blastic osseous lesion.

Review of the MIP images confirms the above findings.
IMPRESSION: 1. No evidence of acute pulmonary embolus to the level of the
proximal lobar arteries. Evaluation of the more distal pulmonary
arterial tree is nondiagnostic secondary to a combination of
relatively poor vessel opacification and respiratory motion
artifact.
2. No evidence of pneumonia or other acute cardiopulmonary process.
3. Bibasilar subsegmental atelectasis.
4. Aberrant right subclavian artery.
5. Although incompletely imaged, the morphology of the liver
suggests the possibility of cirrhotic change. Does the patient have
clinical signs/symptoms of cirrhosis?

## 2019-01-22 DIAGNOSIS — N186 End stage renal disease: Secondary | ICD-10-CM | POA: Diagnosis not present

## 2019-01-22 DIAGNOSIS — N2581 Secondary hyperparathyroidism of renal origin: Secondary | ICD-10-CM | POA: Diagnosis not present

## 2019-01-22 DIAGNOSIS — D509 Iron deficiency anemia, unspecified: Secondary | ICD-10-CM | POA: Diagnosis not present

## 2019-01-22 DIAGNOSIS — E119 Type 2 diabetes mellitus without complications: Secondary | ICD-10-CM | POA: Diagnosis not present

## 2019-01-22 DIAGNOSIS — D631 Anemia in chronic kidney disease: Secondary | ICD-10-CM | POA: Diagnosis not present

## 2019-01-23 DIAGNOSIS — M14672 Charcot's joint, left ankle and foot: Secondary | ICD-10-CM | POA: Diagnosis not present

## 2019-01-24 IMAGING — CR DG CHEST 1V PORT
1 series · 1 of 1 positions shown · non-contrast
Comparison: CT chest 03/04/2017

CLINICAL DATA: Dialysis, shortness of breath

EXAM:
PORTABLE CHEST 1 VIEW

[AP]
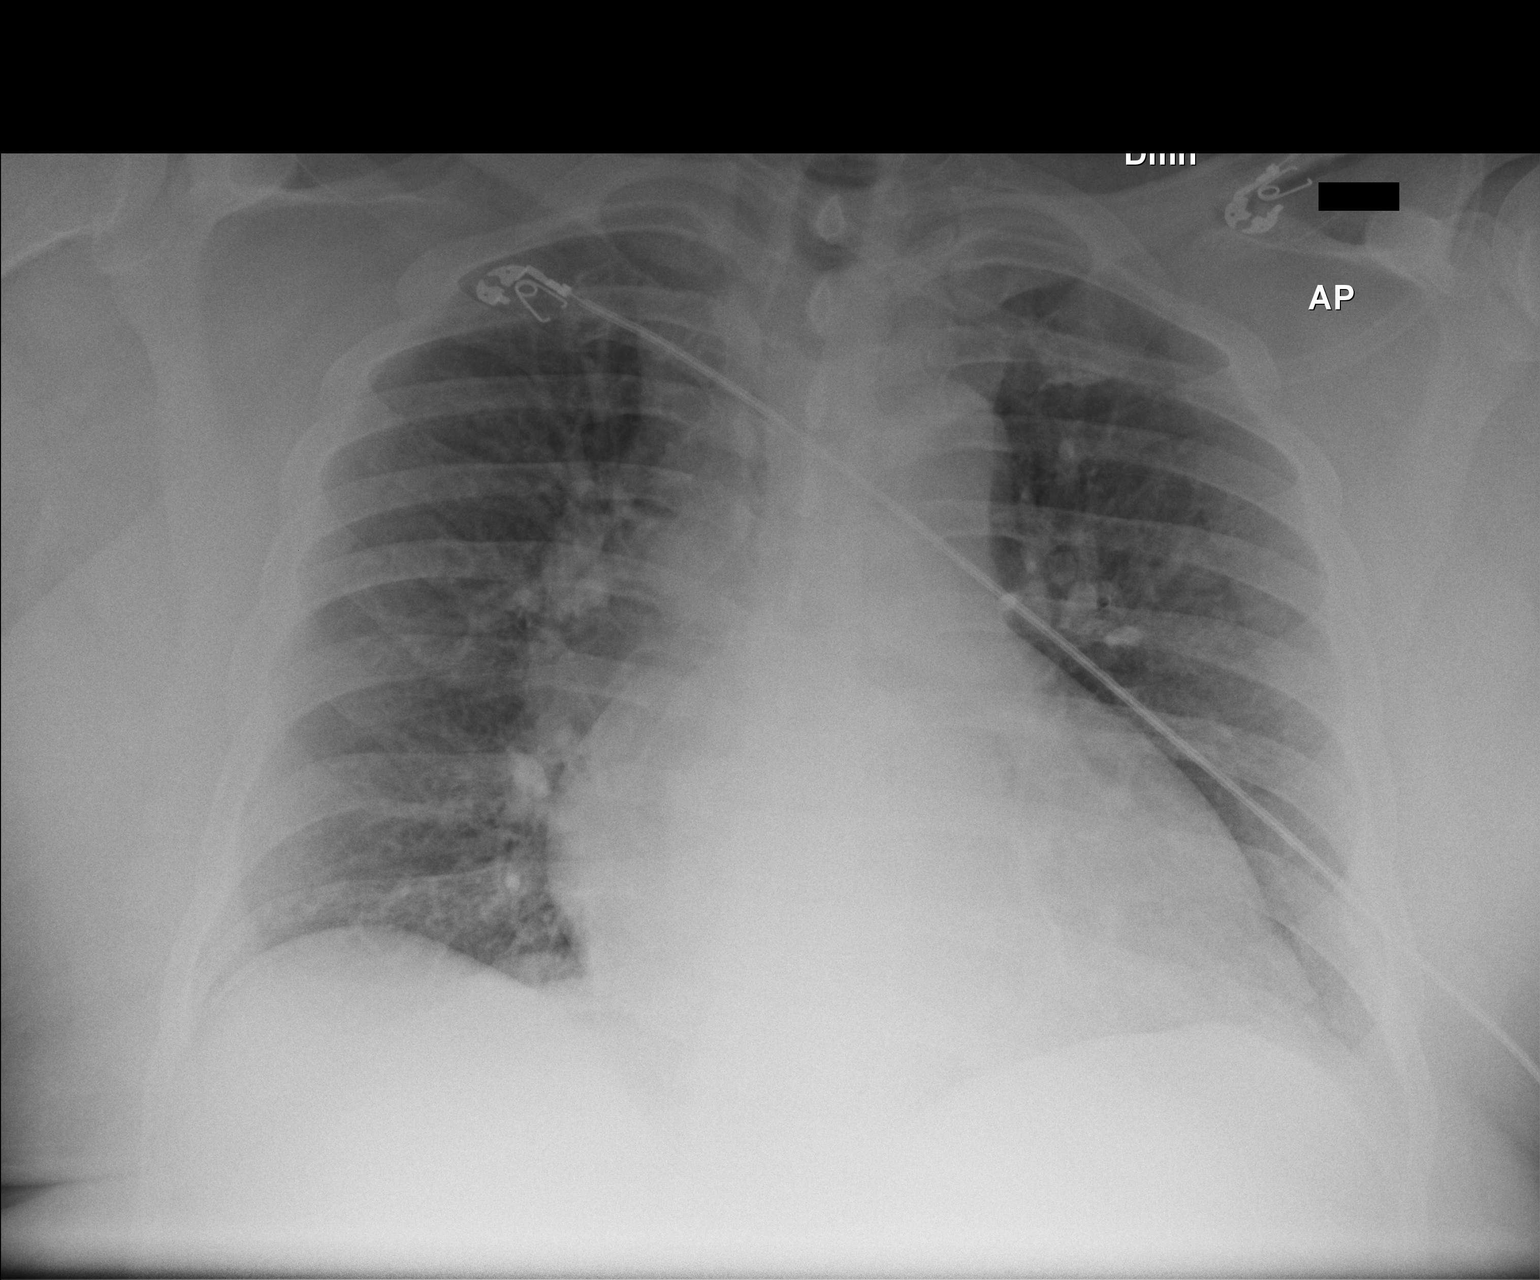

[1 of 1 positions shown; findings below may reference images not displayed]

FINDINGS: There is no focal parenchymal opacity. There is no pleural effusion
or pneumothorax. There is stable cardiomegaly.

The osseous structures are unremarkable.
IMPRESSION: No active disease.

## 2019-01-26 DIAGNOSIS — E119 Type 2 diabetes mellitus without complications: Secondary | ICD-10-CM | POA: Diagnosis not present

## 2019-01-26 DIAGNOSIS — N2581 Secondary hyperparathyroidism of renal origin: Secondary | ICD-10-CM | POA: Diagnosis not present

## 2019-01-26 DIAGNOSIS — D631 Anemia in chronic kidney disease: Secondary | ICD-10-CM | POA: Diagnosis not present

## 2019-01-26 DIAGNOSIS — N186 End stage renal disease: Secondary | ICD-10-CM | POA: Diagnosis not present

## 2019-01-26 DIAGNOSIS — D509 Iron deficiency anemia, unspecified: Secondary | ICD-10-CM | POA: Diagnosis not present

## 2019-01-29 DIAGNOSIS — N2581 Secondary hyperparathyroidism of renal origin: Secondary | ICD-10-CM | POA: Diagnosis not present

## 2019-01-29 DIAGNOSIS — N186 End stage renal disease: Secondary | ICD-10-CM | POA: Diagnosis not present

## 2019-01-29 DIAGNOSIS — D509 Iron deficiency anemia, unspecified: Secondary | ICD-10-CM | POA: Diagnosis not present

## 2019-01-29 DIAGNOSIS — D631 Anemia in chronic kidney disease: Secondary | ICD-10-CM | POA: Diagnosis not present

## 2019-01-29 DIAGNOSIS — E119 Type 2 diabetes mellitus without complications: Secondary | ICD-10-CM | POA: Diagnosis not present

## 2019-01-31 DIAGNOSIS — N2581 Secondary hyperparathyroidism of renal origin: Secondary | ICD-10-CM | POA: Diagnosis not present

## 2019-01-31 DIAGNOSIS — E119 Type 2 diabetes mellitus without complications: Secondary | ICD-10-CM | POA: Diagnosis not present

## 2019-01-31 DIAGNOSIS — N186 End stage renal disease: Secondary | ICD-10-CM | POA: Diagnosis not present

## 2019-01-31 DIAGNOSIS — D509 Iron deficiency anemia, unspecified: Secondary | ICD-10-CM | POA: Diagnosis not present

## 2019-01-31 DIAGNOSIS — D631 Anemia in chronic kidney disease: Secondary | ICD-10-CM | POA: Diagnosis not present

## 2019-02-02 DIAGNOSIS — E119 Type 2 diabetes mellitus without complications: Secondary | ICD-10-CM | POA: Diagnosis not present

## 2019-02-02 DIAGNOSIS — D631 Anemia in chronic kidney disease: Secondary | ICD-10-CM | POA: Diagnosis not present

## 2019-02-02 DIAGNOSIS — N2581 Secondary hyperparathyroidism of renal origin: Secondary | ICD-10-CM | POA: Diagnosis not present

## 2019-02-02 DIAGNOSIS — D509 Iron deficiency anemia, unspecified: Secondary | ICD-10-CM | POA: Diagnosis not present

## 2019-02-02 DIAGNOSIS — N186 End stage renal disease: Secondary | ICD-10-CM | POA: Diagnosis not present

## 2019-02-05 DIAGNOSIS — N2581 Secondary hyperparathyroidism of renal origin: Secondary | ICD-10-CM | POA: Diagnosis not present

## 2019-02-05 DIAGNOSIS — N186 End stage renal disease: Secondary | ICD-10-CM | POA: Diagnosis not present

## 2019-02-05 DIAGNOSIS — D631 Anemia in chronic kidney disease: Secondary | ICD-10-CM | POA: Diagnosis not present

## 2019-02-05 DIAGNOSIS — D509 Iron deficiency anemia, unspecified: Secondary | ICD-10-CM | POA: Diagnosis not present

## 2019-02-05 DIAGNOSIS — E119 Type 2 diabetes mellitus without complications: Secondary | ICD-10-CM | POA: Diagnosis not present

## 2019-02-07 DIAGNOSIS — N186 End stage renal disease: Secondary | ICD-10-CM | POA: Diagnosis not present

## 2019-02-07 DIAGNOSIS — E119 Type 2 diabetes mellitus without complications: Secondary | ICD-10-CM | POA: Diagnosis not present

## 2019-02-07 DIAGNOSIS — D509 Iron deficiency anemia, unspecified: Secondary | ICD-10-CM | POA: Diagnosis not present

## 2019-02-07 DIAGNOSIS — D631 Anemia in chronic kidney disease: Secondary | ICD-10-CM | POA: Diagnosis not present

## 2019-02-07 DIAGNOSIS — N2581 Secondary hyperparathyroidism of renal origin: Secondary | ICD-10-CM | POA: Diagnosis not present

## 2019-02-08 DIAGNOSIS — M14672 Charcot's joint, left ankle and foot: Secondary | ICD-10-CM | POA: Diagnosis not present

## 2019-02-09 DIAGNOSIS — N186 End stage renal disease: Secondary | ICD-10-CM | POA: Diagnosis not present

## 2019-02-09 DIAGNOSIS — D509 Iron deficiency anemia, unspecified: Secondary | ICD-10-CM | POA: Diagnosis not present

## 2019-02-09 DIAGNOSIS — E119 Type 2 diabetes mellitus without complications: Secondary | ICD-10-CM | POA: Diagnosis not present

## 2019-02-09 DIAGNOSIS — N2581 Secondary hyperparathyroidism of renal origin: Secondary | ICD-10-CM | POA: Diagnosis not present

## 2019-02-09 DIAGNOSIS — D631 Anemia in chronic kidney disease: Secondary | ICD-10-CM | POA: Diagnosis not present

## 2019-02-12 DIAGNOSIS — N2581 Secondary hyperparathyroidism of renal origin: Secondary | ICD-10-CM | POA: Diagnosis not present

## 2019-02-12 DIAGNOSIS — E119 Type 2 diabetes mellitus without complications: Secondary | ICD-10-CM | POA: Diagnosis not present

## 2019-02-12 DIAGNOSIS — D509 Iron deficiency anemia, unspecified: Secondary | ICD-10-CM | POA: Diagnosis not present

## 2019-02-12 DIAGNOSIS — N186 End stage renal disease: Secondary | ICD-10-CM | POA: Diagnosis not present

## 2019-02-12 DIAGNOSIS — D631 Anemia in chronic kidney disease: Secondary | ICD-10-CM | POA: Diagnosis not present

## 2019-02-14 DIAGNOSIS — Z23 Encounter for immunization: Secondary | ICD-10-CM | POA: Diagnosis not present

## 2019-02-14 DIAGNOSIS — E1129 Type 2 diabetes mellitus with other diabetic kidney complication: Secondary | ICD-10-CM | POA: Diagnosis not present

## 2019-02-14 DIAGNOSIS — N186 End stage renal disease: Secondary | ICD-10-CM | POA: Diagnosis not present

## 2019-02-14 DIAGNOSIS — Z992 Dependence on renal dialysis: Secondary | ICD-10-CM | POA: Diagnosis not present

## 2019-02-14 DIAGNOSIS — E119 Type 2 diabetes mellitus without complications: Secondary | ICD-10-CM | POA: Diagnosis not present

## 2019-02-14 DIAGNOSIS — N2581 Secondary hyperparathyroidism of renal origin: Secondary | ICD-10-CM | POA: Diagnosis not present

## 2019-02-15 ENCOUNTER — Ambulatory Visit: Payer: Medicare Other | Admitting: Podiatry

## 2019-02-16 DIAGNOSIS — N2581 Secondary hyperparathyroidism of renal origin: Secondary | ICD-10-CM | POA: Diagnosis not present

## 2019-02-16 DIAGNOSIS — E119 Type 2 diabetes mellitus without complications: Secondary | ICD-10-CM | POA: Diagnosis not present

## 2019-02-16 DIAGNOSIS — Z23 Encounter for immunization: Secondary | ICD-10-CM | POA: Diagnosis not present

## 2019-02-16 DIAGNOSIS — N186 End stage renal disease: Secondary | ICD-10-CM | POA: Diagnosis not present

## 2019-02-19 DIAGNOSIS — N2581 Secondary hyperparathyroidism of renal origin: Secondary | ICD-10-CM | POA: Diagnosis not present

## 2019-02-19 DIAGNOSIS — N186 End stage renal disease: Secondary | ICD-10-CM | POA: Diagnosis not present

## 2019-02-19 DIAGNOSIS — Z23 Encounter for immunization: Secondary | ICD-10-CM | POA: Diagnosis not present

## 2019-02-19 DIAGNOSIS — E119 Type 2 diabetes mellitus without complications: Secondary | ICD-10-CM | POA: Diagnosis not present

## 2019-02-20 ENCOUNTER — Ambulatory Visit: Payer: Medicare Other | Admitting: Podiatry

## 2019-02-21 ENCOUNTER — Ambulatory Visit (INDEPENDENT_AMBULATORY_CARE_PROVIDER_SITE_OTHER): Payer: Medicare Other | Admitting: Podiatry

## 2019-02-21 ENCOUNTER — Other Ambulatory Visit: Payer: Self-pay

## 2019-02-21 VITALS — BP 172/82 | HR 78 | Temp 98.8°F

## 2019-02-21 DIAGNOSIS — M79675 Pain in left toe(s): Secondary | ICD-10-CM | POA: Diagnosis not present

## 2019-02-21 DIAGNOSIS — E1142 Type 2 diabetes mellitus with diabetic polyneuropathy: Secondary | ICD-10-CM

## 2019-02-21 DIAGNOSIS — M14672 Charcot's joint, left ankle and foot: Secondary | ICD-10-CM | POA: Diagnosis not present

## 2019-02-21 DIAGNOSIS — N186 End stage renal disease: Secondary | ICD-10-CM | POA: Diagnosis not present

## 2019-02-21 DIAGNOSIS — B351 Tinea unguium: Secondary | ICD-10-CM | POA: Diagnosis not present

## 2019-02-21 DIAGNOSIS — M79674 Pain in right toe(s): Secondary | ICD-10-CM | POA: Diagnosis not present

## 2019-02-21 DIAGNOSIS — N2581 Secondary hyperparathyroidism of renal origin: Secondary | ICD-10-CM | POA: Diagnosis not present

## 2019-02-21 DIAGNOSIS — Z23 Encounter for immunization: Secondary | ICD-10-CM | POA: Diagnosis not present

## 2019-02-21 DIAGNOSIS — E119 Type 2 diabetes mellitus without complications: Secondary | ICD-10-CM | POA: Diagnosis not present

## 2019-02-21 NOTE — Progress Notes (Signed)
Subjective: [name] presents today for preventative diabetic foot care. He presents with thick toenails 1-5 b/l that he cannot cut and which interfere with daily activities.   He has h/o Charcot neuroarthropathy and was recently seen in February for Charcot flare. He is being followed by Ortho, Drs. Maxwell Caul and [employer]. He states he should be getting his cast removed today.  [name], MD is his PCP.    Current Outpatient Medications:  .  acetaminophen (TYLENOL) 500 MG tablet, Take 1,000 mg by mouth every 6 (six) hours as needed for mild pain, moderate pain, fever or headache. , Disp: , Rfl:  .  allopurinol (ZYLOPRIM) 100 MG tablet, Take 1 tablet (100 mg total) by mouth daily. (Patient taking differently: Take 100 mg by mouth 2 (two) times daily. ), Disp: 30 tablet, Rfl: 2 .  amLODipine (NORVASC) 5 MG tablet, Take 5 mg by mouth daily., Disp: , Rfl:  .  AURYXIA 1 GM 210 MG(Fe) tablet, Take 420 mg by mouth 2 (two) times daily with a meal. , Disp: , Rfl: 12 .  benzonatate (TESSALON) 100 MG capsule, TAKE 1 CAPSULE AS NEEDED THREE TIMES A DAY AS NEEDED FOR COUGH ORALLY 10 DAYS, Disp: , Rfl:  .  carvedilol (COREG) 6.25 MG tablet, Take 6.25 mg by mouth 2 (two) times daily with a meal., Disp: , Rfl:  .  cinacalcet (SENSIPAR) 90 MG tablet, Take 90 mg by mouth every Monday, Wednesday, and Friday. , Disp: , Rfl:  .  clotrimazole (LOTRIMIN) 1 % cream, Apply to both feet and between toes twice daily, Disp: 30 g, Rfl: 2 .  COLCRYS 0.6 MG tablet, 1 TABLET ONE TIME AS NEEDED FOR GOUT MAY REPEAT DOSE IN 2 WEEKS IF NEEDED, Disp: , Rfl:  .  diphenhydrAMINE (BENADRYL) 25 MG tablet, Take 25 mg by mouth every Monday, Wednesday, and Friday., Disp: , Rfl:  .  doxycycline (VIBRAMYCIN) 100 MG capsule, TAKE 1 CAPSULE BY MOUTH TWICE A DAY FOR 10 DAYS, Disp: , Rfl:  .  furosemide (LASIX) 40 MG tablet, Take 40 mg by mouth daily., Disp: , Rfl:  .  gabapentin (NEURONTIN) 100 MG capsule, Take 100 mg by mouth 2 (two)  times daily. , Disp: , Rfl:  .  HYDROcodone-acetaminophen (NORCO/VICODIN) 5-325 MG tablet, Take 1 tablet by mouth every 6 (six) hours as needed for moderate pain., Disp: 14 tablet, Rfl: 0 .  Insulin Detemir (LEVEMIR FLEXTOUCH) 100 UNIT/ML Pen, Inject 10 Units into the skin at bedtime. , Disp: , Rfl:  .  insulin glargine (LANTUS) 100 UNIT/ML injection, Inject into the skin., Disp: , Rfl:  .  NOVOLOG FLEXPEN 100 UNIT/ML FlexPen, Inject 10 Units into the skin 3 (three) times daily with meals. (Patient taking differently: Inject 3 Units into the skin 2 (two) times daily. ), Disp: 15 mL, Rfl: 6 .  oxyCODONE-acetaminophen (PERCOCET/ROXICET) 5-325 MG tablet, Take 1 tablet by mouth every 6 (six) hours as needed., Disp: 10 tablet, Rfl: 0 .  pravastatin (PRAVACHOL) 20 MG tablet, Take 20 mg by mouth daily. , Disp: , Rfl:   Allergies  Allergen Reactions  . Penicillins Nausea And Vomiting and Other (See Comments)    Has patient had a PCN reaction causing immediate rash, facial/tongue/throat swelling, SOB or lightheadedness with hypotension: No Has patient had a PCN reaction causing severe rash involving mucus membranes or skin necrosis: No Has patient had a PCN reaction that required hospitalization No Has patient had a PCN reaction occurring within the last  10 years: No If all of the above answers are "NO", then may proceed with Cephalosporin use.  . Vioxx [Rofecoxib] Nausea And Vomiting    Objective:  Vascular Examination: Capillary refill time delayed x 10 digits  Dorsalis pedis palpable right LE; unable to assess due to below knee cast on LLE  Posterior tibial pulse 1/4 right LE;unable to assess due to below knee cast on LLE  Digital hair absent x 10 digits  Skin temperature gradient WNL RLE; unable to assess due to below knee cast on LLE  Dermatological Examination: Skin with normal turgor, texture and tone  Toenails 1-5 b/l discolored, thick, dystrophic with subungual debris and pain with  palpation to nailbeds due to thickness of nails.  Musculoskeletal: Muscle strength 5/5 to all LE muscle groups RLE  Purple below knee cast LLE clean, dry and intact.  No pain, crepitus or joint limitation noted with ROM RLE  Neurological: Diminished sensation with 10 gram monofilament.  Assessment: Onychomycosis toenails 1-5 b/l  NIDDM with peripheral neuropathy Charcot neuroarthropathy  Plan: 1. Toenails 1-5 b/l were debrided in length and girth without iatrogenic bleeding. 2. Patient to continue soft, supportive shoe gear daily. 3. Patient to report any pedal injuries to medical professional immediately. 4. Follow up 3 months.  5. Patient/POA to call should there be a concern in the interim.

## 2019-02-22 ENCOUNTER — Encounter: Payer: Self-pay | Admitting: Podiatry

## 2019-02-22 ENCOUNTER — Ambulatory Visit: Payer: Medicare Other | Admitting: Podiatry

## 2019-02-22 DIAGNOSIS — I10 Essential (primary) hypertension: Secondary | ICD-10-CM | POA: Diagnosis not present

## 2019-02-22 DIAGNOSIS — E7800 Pure hypercholesterolemia, unspecified: Secondary | ICD-10-CM | POA: Diagnosis not present

## 2019-02-22 DIAGNOSIS — E1165 Type 2 diabetes mellitus with hyperglycemia: Secondary | ICD-10-CM | POA: Diagnosis not present

## 2019-02-22 DIAGNOSIS — E1140 Type 2 diabetes mellitus with diabetic neuropathy, unspecified: Secondary | ICD-10-CM | POA: Diagnosis not present

## 2019-02-22 DIAGNOSIS — N186 End stage renal disease: Secondary | ICD-10-CM | POA: Diagnosis not present

## 2019-02-23 DIAGNOSIS — E119 Type 2 diabetes mellitus without complications: Secondary | ICD-10-CM | POA: Diagnosis not present

## 2019-02-23 DIAGNOSIS — N2581 Secondary hyperparathyroidism of renal origin: Secondary | ICD-10-CM | POA: Diagnosis not present

## 2019-02-23 DIAGNOSIS — N186 End stage renal disease: Secondary | ICD-10-CM | POA: Diagnosis not present

## 2019-02-23 DIAGNOSIS — Z23 Encounter for immunization: Secondary | ICD-10-CM | POA: Diagnosis not present

## 2019-02-26 DIAGNOSIS — Z23 Encounter for immunization: Secondary | ICD-10-CM | POA: Diagnosis not present

## 2019-02-26 DIAGNOSIS — E119 Type 2 diabetes mellitus without complications: Secondary | ICD-10-CM | POA: Diagnosis not present

## 2019-02-26 DIAGNOSIS — N2581 Secondary hyperparathyroidism of renal origin: Secondary | ICD-10-CM | POA: Diagnosis not present

## 2019-02-26 DIAGNOSIS — N186 End stage renal disease: Secondary | ICD-10-CM | POA: Diagnosis not present

## 2019-02-28 DIAGNOSIS — N186 End stage renal disease: Secondary | ICD-10-CM | POA: Diagnosis not present

## 2019-02-28 DIAGNOSIS — E119 Type 2 diabetes mellitus without complications: Secondary | ICD-10-CM | POA: Diagnosis not present

## 2019-02-28 DIAGNOSIS — N2581 Secondary hyperparathyroidism of renal origin: Secondary | ICD-10-CM | POA: Diagnosis not present

## 2019-02-28 DIAGNOSIS — Z23 Encounter for immunization: Secondary | ICD-10-CM | POA: Diagnosis not present

## 2019-03-02 DIAGNOSIS — Z23 Encounter for immunization: Secondary | ICD-10-CM | POA: Diagnosis not present

## 2019-03-02 DIAGNOSIS — N186 End stage renal disease: Secondary | ICD-10-CM | POA: Diagnosis not present

## 2019-03-02 DIAGNOSIS — N2581 Secondary hyperparathyroidism of renal origin: Secondary | ICD-10-CM | POA: Diagnosis not present

## 2019-03-02 DIAGNOSIS — E119 Type 2 diabetes mellitus without complications: Secondary | ICD-10-CM | POA: Diagnosis not present

## 2019-03-05 DIAGNOSIS — N2581 Secondary hyperparathyroidism of renal origin: Secondary | ICD-10-CM | POA: Diagnosis not present

## 2019-03-05 DIAGNOSIS — Z23 Encounter for immunization: Secondary | ICD-10-CM | POA: Diagnosis not present

## 2019-03-05 DIAGNOSIS — E119 Type 2 diabetes mellitus without complications: Secondary | ICD-10-CM | POA: Diagnosis not present

## 2019-03-05 DIAGNOSIS — N186 End stage renal disease: Secondary | ICD-10-CM | POA: Diagnosis not present

## 2019-03-07 DIAGNOSIS — E119 Type 2 diabetes mellitus without complications: Secondary | ICD-10-CM | POA: Diagnosis not present

## 2019-03-07 DIAGNOSIS — M14672 Charcot's joint, left ankle and foot: Secondary | ICD-10-CM | POA: Diagnosis not present

## 2019-03-07 DIAGNOSIS — N2581 Secondary hyperparathyroidism of renal origin: Secondary | ICD-10-CM | POA: Diagnosis not present

## 2019-03-07 DIAGNOSIS — N186 End stage renal disease: Secondary | ICD-10-CM | POA: Diagnosis not present

## 2019-03-07 DIAGNOSIS — Z23 Encounter for immunization: Secondary | ICD-10-CM | POA: Diagnosis not present

## 2019-03-09 DIAGNOSIS — N186 End stage renal disease: Secondary | ICD-10-CM | POA: Diagnosis not present

## 2019-03-09 DIAGNOSIS — N2581 Secondary hyperparathyroidism of renal origin: Secondary | ICD-10-CM | POA: Diagnosis not present

## 2019-03-09 DIAGNOSIS — Z23 Encounter for immunization: Secondary | ICD-10-CM | POA: Diagnosis not present

## 2019-03-09 DIAGNOSIS — E119 Type 2 diabetes mellitus without complications: Secondary | ICD-10-CM | POA: Diagnosis not present

## 2019-03-12 DIAGNOSIS — N186 End stage renal disease: Secondary | ICD-10-CM | POA: Diagnosis not present

## 2019-03-12 DIAGNOSIS — N2581 Secondary hyperparathyroidism of renal origin: Secondary | ICD-10-CM | POA: Diagnosis not present

## 2019-03-12 DIAGNOSIS — E119 Type 2 diabetes mellitus without complications: Secondary | ICD-10-CM | POA: Diagnosis not present

## 2019-03-12 DIAGNOSIS — Z23 Encounter for immunization: Secondary | ICD-10-CM | POA: Diagnosis not present

## 2019-03-14 DIAGNOSIS — N2581 Secondary hyperparathyroidism of renal origin: Secondary | ICD-10-CM | POA: Diagnosis not present

## 2019-03-14 DIAGNOSIS — N186 End stage renal disease: Secondary | ICD-10-CM | POA: Diagnosis not present

## 2019-03-14 DIAGNOSIS — Z23 Encounter for immunization: Secondary | ICD-10-CM | POA: Diagnosis not present

## 2019-03-14 DIAGNOSIS — E119 Type 2 diabetes mellitus without complications: Secondary | ICD-10-CM | POA: Diagnosis not present

## 2019-03-15 DIAGNOSIS — N186 End stage renal disease: Secondary | ICD-10-CM | POA: Diagnosis not present

## 2019-03-15 DIAGNOSIS — Z992 Dependence on renal dialysis: Secondary | ICD-10-CM | POA: Diagnosis not present

## 2019-03-15 DIAGNOSIS — I871 Compression of vein: Secondary | ICD-10-CM | POA: Diagnosis not present

## 2019-03-16 DIAGNOSIS — E1129 Type 2 diabetes mellitus with other diabetic kidney complication: Secondary | ICD-10-CM | POA: Diagnosis not present

## 2019-03-16 DIAGNOSIS — E119 Type 2 diabetes mellitus without complications: Secondary | ICD-10-CM | POA: Diagnosis not present

## 2019-03-16 DIAGNOSIS — Z992 Dependence on renal dialysis: Secondary | ICD-10-CM | POA: Diagnosis not present

## 2019-03-16 DIAGNOSIS — N2581 Secondary hyperparathyroidism of renal origin: Secondary | ICD-10-CM | POA: Diagnosis not present

## 2019-03-16 DIAGNOSIS — N186 End stage renal disease: Secondary | ICD-10-CM | POA: Diagnosis not present

## 2019-03-19 DIAGNOSIS — N2581 Secondary hyperparathyroidism of renal origin: Secondary | ICD-10-CM | POA: Diagnosis not present

## 2019-03-19 DIAGNOSIS — E119 Type 2 diabetes mellitus without complications: Secondary | ICD-10-CM | POA: Diagnosis not present

## 2019-03-19 DIAGNOSIS — N186 End stage renal disease: Secondary | ICD-10-CM | POA: Diagnosis not present

## 2019-03-20 DIAGNOSIS — E0842 Diabetes mellitus due to underlying condition with diabetic polyneuropathy: Secondary | ICD-10-CM | POA: Diagnosis not present

## 2019-03-20 DIAGNOSIS — G4730 Sleep apnea, unspecified: Secondary | ICD-10-CM | POA: Diagnosis not present

## 2019-03-20 DIAGNOSIS — D472 Monoclonal gammopathy: Secondary | ICD-10-CM | POA: Diagnosis not present

## 2019-03-20 DIAGNOSIS — E1122 Type 2 diabetes mellitus with diabetic chronic kidney disease: Secondary | ICD-10-CM | POA: Diagnosis not present

## 2019-03-20 DIAGNOSIS — E782 Mixed hyperlipidemia: Secondary | ICD-10-CM | POA: Diagnosis not present

## 2019-03-20 DIAGNOSIS — I10 Essential (primary) hypertension: Secondary | ICD-10-CM | POA: Diagnosis not present

## 2019-03-20 DIAGNOSIS — R600 Localized edema: Secondary | ICD-10-CM | POA: Diagnosis not present

## 2019-03-20 DIAGNOSIS — N186 End stage renal disease: Secondary | ICD-10-CM | POA: Diagnosis not present

## 2019-03-20 DIAGNOSIS — I509 Heart failure, unspecified: Secondary | ICD-10-CM | POA: Diagnosis not present

## 2019-03-20 DIAGNOSIS — M109 Gout, unspecified: Secondary | ICD-10-CM | POA: Diagnosis not present

## 2019-03-21 DIAGNOSIS — N2581 Secondary hyperparathyroidism of renal origin: Secondary | ICD-10-CM | POA: Diagnosis not present

## 2019-03-21 DIAGNOSIS — M14679 Charcot's joint, unspecified ankle and foot: Secondary | ICD-10-CM | POA: Diagnosis not present

## 2019-03-21 DIAGNOSIS — E119 Type 2 diabetes mellitus without complications: Secondary | ICD-10-CM | POA: Diagnosis not present

## 2019-03-21 DIAGNOSIS — E1140 Type 2 diabetes mellitus with diabetic neuropathy, unspecified: Secondary | ICD-10-CM | POA: Diagnosis not present

## 2019-03-21 DIAGNOSIS — M79672 Pain in left foot: Secondary | ICD-10-CM | POA: Diagnosis not present

## 2019-03-21 DIAGNOSIS — N186 End stage renal disease: Secondary | ICD-10-CM | POA: Diagnosis not present

## 2019-03-23 DIAGNOSIS — N186 End stage renal disease: Secondary | ICD-10-CM | POA: Diagnosis not present

## 2019-03-23 DIAGNOSIS — E119 Type 2 diabetes mellitus without complications: Secondary | ICD-10-CM | POA: Diagnosis not present

## 2019-03-23 DIAGNOSIS — N2581 Secondary hyperparathyroidism of renal origin: Secondary | ICD-10-CM | POA: Diagnosis not present

## 2019-03-26 DIAGNOSIS — N186 End stage renal disease: Secondary | ICD-10-CM | POA: Diagnosis not present

## 2019-03-26 DIAGNOSIS — N2581 Secondary hyperparathyroidism of renal origin: Secondary | ICD-10-CM | POA: Diagnosis not present

## 2019-03-26 DIAGNOSIS — E119 Type 2 diabetes mellitus without complications: Secondary | ICD-10-CM | POA: Diagnosis not present

## 2019-03-28 DIAGNOSIS — N186 End stage renal disease: Secondary | ICD-10-CM | POA: Diagnosis not present

## 2019-03-28 DIAGNOSIS — E119 Type 2 diabetes mellitus without complications: Secondary | ICD-10-CM | POA: Diagnosis not present

## 2019-03-28 DIAGNOSIS — N2581 Secondary hyperparathyroidism of renal origin: Secondary | ICD-10-CM | POA: Diagnosis not present

## 2019-03-30 DIAGNOSIS — E119 Type 2 diabetes mellitus without complications: Secondary | ICD-10-CM | POA: Diagnosis not present

## 2019-03-30 DIAGNOSIS — N186 End stage renal disease: Secondary | ICD-10-CM | POA: Diagnosis not present

## 2019-03-30 DIAGNOSIS — N2581 Secondary hyperparathyroidism of renal origin: Secondary | ICD-10-CM | POA: Diagnosis not present

## 2019-04-02 DIAGNOSIS — N186 End stage renal disease: Secondary | ICD-10-CM | POA: Diagnosis not present

## 2019-04-02 DIAGNOSIS — N2581 Secondary hyperparathyroidism of renal origin: Secondary | ICD-10-CM | POA: Diagnosis not present

## 2019-04-02 DIAGNOSIS — E119 Type 2 diabetes mellitus without complications: Secondary | ICD-10-CM | POA: Diagnosis not present

## 2019-04-04 DIAGNOSIS — N2581 Secondary hyperparathyroidism of renal origin: Secondary | ICD-10-CM | POA: Diagnosis not present

## 2019-04-04 DIAGNOSIS — N186 End stage renal disease: Secondary | ICD-10-CM | POA: Diagnosis not present

## 2019-04-04 DIAGNOSIS — E119 Type 2 diabetes mellitus without complications: Secondary | ICD-10-CM | POA: Diagnosis not present

## 2019-04-06 DIAGNOSIS — E119 Type 2 diabetes mellitus without complications: Secondary | ICD-10-CM | POA: Diagnosis not present

## 2019-04-06 DIAGNOSIS — N186 End stage renal disease: Secondary | ICD-10-CM | POA: Diagnosis not present

## 2019-04-06 DIAGNOSIS — N2581 Secondary hyperparathyroidism of renal origin: Secondary | ICD-10-CM | POA: Diagnosis not present

## 2019-04-09 DIAGNOSIS — N186 End stage renal disease: Secondary | ICD-10-CM | POA: Diagnosis not present

## 2019-04-09 DIAGNOSIS — N2581 Secondary hyperparathyroidism of renal origin: Secondary | ICD-10-CM | POA: Diagnosis not present

## 2019-04-09 DIAGNOSIS — E119 Type 2 diabetes mellitus without complications: Secondary | ICD-10-CM | POA: Diagnosis not present

## 2019-04-11 DIAGNOSIS — N186 End stage renal disease: Secondary | ICD-10-CM | POA: Diagnosis not present

## 2019-04-11 DIAGNOSIS — E119 Type 2 diabetes mellitus without complications: Secondary | ICD-10-CM | POA: Diagnosis not present

## 2019-04-11 DIAGNOSIS — N2581 Secondary hyperparathyroidism of renal origin: Secondary | ICD-10-CM | POA: Diagnosis not present

## 2019-04-13 DIAGNOSIS — N186 End stage renal disease: Secondary | ICD-10-CM | POA: Diagnosis not present

## 2019-04-13 DIAGNOSIS — N2581 Secondary hyperparathyroidism of renal origin: Secondary | ICD-10-CM | POA: Diagnosis not present

## 2019-04-13 DIAGNOSIS — E119 Type 2 diabetes mellitus without complications: Secondary | ICD-10-CM | POA: Diagnosis not present

## 2019-04-16 DIAGNOSIS — E875 Hyperkalemia: Secondary | ICD-10-CM | POA: Diagnosis not present

## 2019-04-16 DIAGNOSIS — E119 Type 2 diabetes mellitus without complications: Secondary | ICD-10-CM | POA: Diagnosis not present

## 2019-04-16 DIAGNOSIS — N186 End stage renal disease: Secondary | ICD-10-CM | POA: Diagnosis not present

## 2019-04-16 DIAGNOSIS — E1129 Type 2 diabetes mellitus with other diabetic kidney complication: Secondary | ICD-10-CM | POA: Diagnosis not present

## 2019-04-16 DIAGNOSIS — N2581 Secondary hyperparathyroidism of renal origin: Secondary | ICD-10-CM | POA: Diagnosis not present

## 2019-04-16 DIAGNOSIS — Z992 Dependence on renal dialysis: Secondary | ICD-10-CM | POA: Diagnosis not present

## 2019-04-18 DIAGNOSIS — E119 Type 2 diabetes mellitus without complications: Secondary | ICD-10-CM | POA: Diagnosis not present

## 2019-04-18 DIAGNOSIS — N186 End stage renal disease: Secondary | ICD-10-CM | POA: Diagnosis not present

## 2019-04-18 DIAGNOSIS — E875 Hyperkalemia: Secondary | ICD-10-CM | POA: Diagnosis not present

## 2019-04-18 DIAGNOSIS — N2581 Secondary hyperparathyroidism of renal origin: Secondary | ICD-10-CM | POA: Diagnosis not present

## 2019-04-20 DIAGNOSIS — E119 Type 2 diabetes mellitus without complications: Secondary | ICD-10-CM | POA: Diagnosis not present

## 2019-04-20 DIAGNOSIS — N186 End stage renal disease: Secondary | ICD-10-CM | POA: Diagnosis not present

## 2019-04-20 DIAGNOSIS — E875 Hyperkalemia: Secondary | ICD-10-CM | POA: Diagnosis not present

## 2019-04-20 DIAGNOSIS — N2581 Secondary hyperparathyroidism of renal origin: Secondary | ICD-10-CM | POA: Diagnosis not present

## 2019-04-23 DIAGNOSIS — N186 End stage renal disease: Secondary | ICD-10-CM | POA: Diagnosis not present

## 2019-04-23 DIAGNOSIS — E119 Type 2 diabetes mellitus without complications: Secondary | ICD-10-CM | POA: Diagnosis not present

## 2019-04-23 DIAGNOSIS — N2581 Secondary hyperparathyroidism of renal origin: Secondary | ICD-10-CM | POA: Diagnosis not present

## 2019-04-23 DIAGNOSIS — M14679 Charcot's joint, unspecified ankle and foot: Secondary | ICD-10-CM | POA: Diagnosis not present

## 2019-04-23 DIAGNOSIS — E875 Hyperkalemia: Secondary | ICD-10-CM | POA: Diagnosis not present

## 2019-04-23 DIAGNOSIS — E1140 Type 2 diabetes mellitus with diabetic neuropathy, unspecified: Secondary | ICD-10-CM | POA: Diagnosis not present

## 2019-04-23 DIAGNOSIS — M79672 Pain in left foot: Secondary | ICD-10-CM | POA: Diagnosis not present

## 2019-04-25 DIAGNOSIS — N2581 Secondary hyperparathyroidism of renal origin: Secondary | ICD-10-CM | POA: Diagnosis not present

## 2019-04-25 DIAGNOSIS — E119 Type 2 diabetes mellitus without complications: Secondary | ICD-10-CM | POA: Diagnosis not present

## 2019-04-25 DIAGNOSIS — E875 Hyperkalemia: Secondary | ICD-10-CM | POA: Diagnosis not present

## 2019-04-25 DIAGNOSIS — N186 End stage renal disease: Secondary | ICD-10-CM | POA: Diagnosis not present

## 2019-04-27 DIAGNOSIS — N186 End stage renal disease: Secondary | ICD-10-CM | POA: Diagnosis not present

## 2019-04-27 DIAGNOSIS — E119 Type 2 diabetes mellitus without complications: Secondary | ICD-10-CM | POA: Diagnosis not present

## 2019-04-27 DIAGNOSIS — N2581 Secondary hyperparathyroidism of renal origin: Secondary | ICD-10-CM | POA: Diagnosis not present

## 2019-04-27 DIAGNOSIS — E875 Hyperkalemia: Secondary | ICD-10-CM | POA: Diagnosis not present

## 2019-04-30 DIAGNOSIS — N186 End stage renal disease: Secondary | ICD-10-CM | POA: Diagnosis not present

## 2019-04-30 DIAGNOSIS — E119 Type 2 diabetes mellitus without complications: Secondary | ICD-10-CM | POA: Diagnosis not present

## 2019-04-30 DIAGNOSIS — E875 Hyperkalemia: Secondary | ICD-10-CM | POA: Diagnosis not present

## 2019-04-30 DIAGNOSIS — N2581 Secondary hyperparathyroidism of renal origin: Secondary | ICD-10-CM | POA: Diagnosis not present

## 2019-05-02 DIAGNOSIS — N2581 Secondary hyperparathyroidism of renal origin: Secondary | ICD-10-CM | POA: Diagnosis not present

## 2019-05-02 DIAGNOSIS — N186 End stage renal disease: Secondary | ICD-10-CM | POA: Diagnosis not present

## 2019-05-02 DIAGNOSIS — E119 Type 2 diabetes mellitus without complications: Secondary | ICD-10-CM | POA: Diagnosis not present

## 2019-05-02 DIAGNOSIS — E875 Hyperkalemia: Secondary | ICD-10-CM | POA: Diagnosis not present

## 2019-05-04 DIAGNOSIS — E119 Type 2 diabetes mellitus without complications: Secondary | ICD-10-CM | POA: Diagnosis not present

## 2019-05-04 DIAGNOSIS — E875 Hyperkalemia: Secondary | ICD-10-CM | POA: Diagnosis not present

## 2019-05-04 DIAGNOSIS — N2581 Secondary hyperparathyroidism of renal origin: Secondary | ICD-10-CM | POA: Diagnosis not present

## 2019-05-04 DIAGNOSIS — N186 End stage renal disease: Secondary | ICD-10-CM | POA: Diagnosis not present

## 2019-05-07 DIAGNOSIS — N186 End stage renal disease: Secondary | ICD-10-CM | POA: Diagnosis not present

## 2019-05-07 DIAGNOSIS — E875 Hyperkalemia: Secondary | ICD-10-CM | POA: Diagnosis not present

## 2019-05-07 DIAGNOSIS — N2581 Secondary hyperparathyroidism of renal origin: Secondary | ICD-10-CM | POA: Diagnosis not present

## 2019-05-07 DIAGNOSIS — E119 Type 2 diabetes mellitus without complications: Secondary | ICD-10-CM | POA: Diagnosis not present

## 2019-05-09 DIAGNOSIS — E875 Hyperkalemia: Secondary | ICD-10-CM | POA: Diagnosis not present

## 2019-05-09 DIAGNOSIS — N186 End stage renal disease: Secondary | ICD-10-CM | POA: Diagnosis not present

## 2019-05-09 DIAGNOSIS — E119 Type 2 diabetes mellitus without complications: Secondary | ICD-10-CM | POA: Diagnosis not present

## 2019-05-09 DIAGNOSIS — N2581 Secondary hyperparathyroidism of renal origin: Secondary | ICD-10-CM | POA: Diagnosis not present

## 2019-05-11 DIAGNOSIS — N2581 Secondary hyperparathyroidism of renal origin: Secondary | ICD-10-CM | POA: Diagnosis not present

## 2019-05-11 DIAGNOSIS — E875 Hyperkalemia: Secondary | ICD-10-CM | POA: Diagnosis not present

## 2019-05-11 DIAGNOSIS — N186 End stage renal disease: Secondary | ICD-10-CM | POA: Diagnosis not present

## 2019-05-11 DIAGNOSIS — E119 Type 2 diabetes mellitus without complications: Secondary | ICD-10-CM | POA: Diagnosis not present

## 2019-05-14 DIAGNOSIS — N186 End stage renal disease: Secondary | ICD-10-CM | POA: Diagnosis not present

## 2019-05-14 DIAGNOSIS — E875 Hyperkalemia: Secondary | ICD-10-CM | POA: Diagnosis not present

## 2019-05-14 DIAGNOSIS — N2581 Secondary hyperparathyroidism of renal origin: Secondary | ICD-10-CM | POA: Diagnosis not present

## 2019-05-14 DIAGNOSIS — E119 Type 2 diabetes mellitus without complications: Secondary | ICD-10-CM | POA: Diagnosis not present

## 2019-05-16 DIAGNOSIS — E875 Hyperkalemia: Secondary | ICD-10-CM | POA: Diagnosis not present

## 2019-05-16 DIAGNOSIS — E1129 Type 2 diabetes mellitus with other diabetic kidney complication: Secondary | ICD-10-CM | POA: Diagnosis not present

## 2019-05-16 DIAGNOSIS — N2581 Secondary hyperparathyroidism of renal origin: Secondary | ICD-10-CM | POA: Diagnosis not present

## 2019-05-16 DIAGNOSIS — Z992 Dependence on renal dialysis: Secondary | ICD-10-CM | POA: Diagnosis not present

## 2019-05-16 DIAGNOSIS — E119 Type 2 diabetes mellitus without complications: Secondary | ICD-10-CM | POA: Diagnosis not present

## 2019-05-16 DIAGNOSIS — N186 End stage renal disease: Secondary | ICD-10-CM | POA: Diagnosis not present

## 2019-05-18 DIAGNOSIS — E875 Hyperkalemia: Secondary | ICD-10-CM | POA: Diagnosis not present

## 2019-05-18 DIAGNOSIS — E119 Type 2 diabetes mellitus without complications: Secondary | ICD-10-CM | POA: Diagnosis not present

## 2019-05-18 DIAGNOSIS — N186 End stage renal disease: Secondary | ICD-10-CM | POA: Diagnosis not present

## 2019-05-18 DIAGNOSIS — N2581 Secondary hyperparathyroidism of renal origin: Secondary | ICD-10-CM | POA: Diagnosis not present

## 2019-05-21 DIAGNOSIS — N186 End stage renal disease: Secondary | ICD-10-CM | POA: Diagnosis not present

## 2019-05-21 DIAGNOSIS — E119 Type 2 diabetes mellitus without complications: Secondary | ICD-10-CM | POA: Diagnosis not present

## 2019-05-21 DIAGNOSIS — E875 Hyperkalemia: Secondary | ICD-10-CM | POA: Diagnosis not present

## 2019-05-21 DIAGNOSIS — N2581 Secondary hyperparathyroidism of renal origin: Secondary | ICD-10-CM | POA: Diagnosis not present

## 2019-05-23 DIAGNOSIS — N2581 Secondary hyperparathyroidism of renal origin: Secondary | ICD-10-CM | POA: Diagnosis not present

## 2019-05-23 DIAGNOSIS — E119 Type 2 diabetes mellitus without complications: Secondary | ICD-10-CM | POA: Diagnosis not present

## 2019-05-23 DIAGNOSIS — E875 Hyperkalemia: Secondary | ICD-10-CM | POA: Diagnosis not present

## 2019-05-23 DIAGNOSIS — N186 End stage renal disease: Secondary | ICD-10-CM | POA: Diagnosis not present

## 2019-05-24 ENCOUNTER — Ambulatory Visit: Payer: Medicare Other | Admitting: Podiatry

## 2019-05-25 ENCOUNTER — Ambulatory Visit (INDEPENDENT_AMBULATORY_CARE_PROVIDER_SITE_OTHER): Payer: Medicare Other | Admitting: Podiatry

## 2019-05-25 ENCOUNTER — Other Ambulatory Visit: Payer: Self-pay

## 2019-05-25 ENCOUNTER — Encounter: Payer: Self-pay | Admitting: Podiatry

## 2019-05-25 VITALS — Temp 98.5°F

## 2019-05-25 DIAGNOSIS — E1142 Type 2 diabetes mellitus with diabetic polyneuropathy: Secondary | ICD-10-CM | POA: Diagnosis not present

## 2019-05-25 DIAGNOSIS — E119 Type 2 diabetes mellitus without complications: Secondary | ICD-10-CM | POA: Diagnosis not present

## 2019-05-25 DIAGNOSIS — E875 Hyperkalemia: Secondary | ICD-10-CM | POA: Diagnosis not present

## 2019-05-25 DIAGNOSIS — B351 Tinea unguium: Secondary | ICD-10-CM | POA: Diagnosis not present

## 2019-05-25 DIAGNOSIS — N186 End stage renal disease: Secondary | ICD-10-CM | POA: Diagnosis not present

## 2019-05-25 DIAGNOSIS — N2581 Secondary hyperparathyroidism of renal origin: Secondary | ICD-10-CM | POA: Diagnosis not present

## 2019-05-25 NOTE — Patient Instructions (Signed)
Diabetes Mellitus and Foot Care Foot care is an important part of your health, especially when you have diabetes. Diabetes may cause you to have problems because of poor blood flow (circulation) to your feet and legs, which can cause your skin to:  Become thinner and drier.  Break more easily.  Heal more slowly.  Peel and crack. You may also have nerve damage (neuropathy) in your legs and feet, causing decreased feeling in them. This means that you may not notice minor injuries to your feet that could lead to more serious problems. Noticing and addressing any potential problems early is the best way to prevent future foot problems. How to care for your feet Foot hygiene  Wash your feet daily with warm water and mild soap. Do not use hot water. Then, pat your feet and the areas between your toes until they are completely dry. Do not soak your feet as this can dry your skin.  Trim your toenails straight across. Do not dig under them or around the cuticle. File the edges of your nails with an emery board or nail file.  Apply a moisturizing lotion or petroleum jelly to the skin on your feet and to dry, brittle toenails. Use lotion that does not contain alcohol and is unscented. Do not apply lotion between your toes. Shoes and socks  Wear clean socks or stockings every day. Make sure they are not too tight. Do not wear knee-high stockings since they may decrease blood flow to your legs.  Wear shoes that fit properly and have enough cushioning. Always look in your shoes before you put them on to be sure there are no objects inside.  To break in new shoes, wear them for just a few hours a day. This prevents injuries on your feet. Wounds, scrapes, corns, and calluses  Check your feet daily for blisters, cuts, bruises, sores, and redness. If you cannot see the bottom of your feet, use a mirror or ask someone for help.  Do not cut corns or calluses or try to remove them with medicine.  If you  find a minor scrape, cut, or break in the skin on your feet, keep it and the skin around it clean and dry. You may clean these areas with mild soap and water. Do not clean the area with peroxide, alcohol, or iodine.  If you have a wound, scrape, corn, or callus on your foot, look at it several times a day to make sure it is healing and not infected. Check for: ? Redness, swelling, or pain. ? Fluid or blood. ? Warmth. ? Pus or a bad smell. General instructions  Do not cross your legs. This may decrease blood flow to your feet.  Do not use heating pads or hot water bottles on your feet. They may burn your skin. If you have lost feeling in your feet or legs, you may not know this is happening until it is too late.  Protect your feet from hot and cold by wearing shoes, such as at the beach or on hot pavement.  Schedule a complete foot exam at least once a year (annually) or more often if you have foot problems. If you have foot problems, report any cuts, sores, or bruises to your health care provider immediately. Contact a health care provider if:  You have a medical condition that increases your risk of infection and you have any cuts, sores, or bruises on your feet.  You have an injury that is not   healing.  You have redness on your legs or feet.  You feel burning or tingling in your legs or feet.  You have pain or cramps in your legs and feet.  Your legs or feet are numb.  Your feet always feel cold.  You have pain around a toenail. Get help right away if:  You have a wound, scrape, corn, or callus on your foot and: ? You have pain, swelling, or redness that gets worse. ? You have fluid or blood coming from the wound, scrape, corn, or callus. ? Your wound, scrape, corn, or callus feels warm to the touch. ? You have pus or a bad smell coming from the wound, scrape, corn, or callus. ? You have a fever. ? You have a red line going up your leg. Summary  Check your feet every day  for cuts, sores, red spots, swelling, and blisters.  Moisturize feet and legs daily.  Wear shoes that fit properly and have enough cushioning.  If you have foot problems, report any cuts, sores, or bruises to your health care provider immediately.  Schedule a complete foot exam at least once a year (annually) or more often if you have foot problems. This information is not intended to replace advice given to you by your health care provider. Make sure you discuss any questions you have with your health care provider. Document Released: 10/29/2000 Document Revised: 12/14/2017 Document Reviewed: 12/03/2016 Elsevier Patient Education  2020 [employer]   Onychomycosis/Fungal Toenails  WHAT IS IT? An infection that lies within the keratin of your nail plate that is caused by a fungus.  WHY ME? Fungal infections affect all ages, sexes, races, and creeds.  There may be many factors that predispose you to a fungal infection such as age, coexisting medical conditions such as diabetes, or an autoimmune disease; stress, medications, fatigue, genetics, etc.  Bottom line: fungus thrives in a warm, moist environment and your shoes offer such a location.  IS IT CONTAGIOUS? Theoretically, yes.  You do not want to share shoes, nail clippers or files with someone who has fungal toenails.  Walking around barefoot in the same room or sleeping in the same bed is unlikely to transfer the organism.  It is important to realize, however, that fungus can spread easily from one nail to the next on the same foot.  HOW DO WE TREAT THIS?  There are several ways to treat this condition.  Treatment may depend on many factors such as age, medications, pregnancy, liver and kidney conditions, etc.  It is best to ask your doctor which options are available to you.  1. No treatment.   Unlike many other medical concerns, you can live with this condition.  However for many people this can be a painful condition and may lead to  ingrown toenails or a bacterial infection.  It is recommended that you keep the nails cut short to help reduce the amount of fungal nail. 2. Topical treatment.  These range from herbal remedies to prescription strength nail lacquers.  About 40-50% effective, topicals require twice daily application for approximately 9 to 12 months or until an entirely new nail has grown out.  The most effective topicals are medical grade medications available through physicians offices. 3. Oral antifungal medications.  With an 80-90% cure rate, the most common oral medication requires 3 to 4 months of therapy and stays in your system for a year as the new nail grows out.  Oral antifungal medications do require   blood work to make sure it is a safe drug for you.  A liver function panel will be performed prior to starting the medication and after the first month of treatment.  It is important to have the blood work performed to avoid any harmful side effects.  In general, this medication safe but blood work is required. 4. Laser Therapy.  This treatment is performed by applying a specialized laser to the affected nail plate.  This therapy is noninvasive, fast, and non-painful.  It is not covered by insurance and is therefore, out of pocket.  The results have been very good with a 80-95% cure rate.  The [facility] is the only practice in the area to offer this therapy. 5. Permanent Nail Avulsion.  Removing the entire nail so that a new nail will not grow back. 

## 2019-05-27 NOTE — Progress Notes (Signed)
Subjective: [name] presents today with painful, thick toenails 1-5 b/l that he cannot cut and which interfere with daily activities.  Pain is aggravated when wearing enclosed shoe gear.  He has a CROW walker for his left LE for Charcot of the left foot/ankle. He is followed by Drs. Maxwell Caul and [place] for this.  [name], MD is his PCP.   Current Outpatient Medications:  .  acetaminophen (TYLENOL) 500 MG tablet, Take 1,000 mg by mouth every 6 (six) hours as needed for mild pain, moderate pain, fever or headache. , Disp: , Rfl:  .  allopurinol (ZYLOPRIM) 100 MG tablet, Take 1 tablet (100 mg total) by mouth daily. (Patient taking differently: Take 100 mg by mouth 2 (two) times daily. ), Disp: 30 tablet, Rfl: 2 .  amLODipine (NORVASC) 5 MG tablet, Take 5 mg by mouth daily., Disp: , Rfl:  .  AURYXIA 1 GM 210 MG(Fe) tablet, Take 420 mg by mouth 2 (two) times daily with a meal. , Disp: , Rfl: 12 .  benzonatate (TESSALON) 100 MG capsule, TAKE 1 CAPSULE AS NEEDED THREE TIMES A DAY AS NEEDED FOR COUGH ORALLY 10 DAYS, Disp: , Rfl:  .  carvedilol (COREG) 6.25 MG tablet, Take 6.25 mg by mouth 2 (two) times daily with a meal., Disp: , Rfl:  .  cinacalcet (SENSIPAR) 90 MG tablet, Take 90 mg by mouth every Monday, Wednesday, and Friday. , Disp: , Rfl:  .  clotrimazole (LOTRIMIN) 1 % cream, Apply to both feet and between toes twice daily, Disp: 30 g, Rfl: 2 .  COLCRYS 0.6 MG tablet, 1 TABLET ONE TIME AS NEEDED FOR GOUT MAY REPEAT DOSE IN 2 WEEKS IF NEEDED, Disp: , Rfl:  .  diphenhydrAMINE (BENADRYL) 25 MG tablet, Take 25 mg by mouth every Monday, Wednesday, and Friday., Disp: , Rfl:  .  doxycycline (VIBRAMYCIN) 100 MG capsule, TAKE 1 CAPSULE BY MOUTH TWICE A DAY FOR 10 DAYS, Disp: , Rfl:  .  furosemide (LASIX) 40 MG tablet, Take 40 mg by mouth daily., Disp: , Rfl:  .  gabapentin (NEURONTIN) 100 MG capsule, Take 100 mg by mouth 2 (two) times daily. , Disp: , Rfl:  .  HYDROcodone-acetaminophen  (NORCO/VICODIN) 5-325 MG tablet, Take 1 tablet by mouth every 6 (six) hours as needed for moderate pain., Disp: 14 tablet, Rfl: 0 .  Insulin Detemir (LEVEMIR FLEXTOUCH) 100 UNIT/ML Pen, Inject 10 Units into the skin at bedtime. , Disp: , Rfl:  .  insulin glargine (LANTUS) 100 UNIT/ML injection, Inject into the skin., Disp: , Rfl:  .  NOVOLOG FLEXPEN 100 UNIT/ML FlexPen, Inject 10 Units into the skin 3 (three) times daily with meals. (Patient taking differently: Inject 3 Units into the skin 2 (two) times daily. ), Disp: 15 mL, Rfl: 6 .  oxyCODONE-acetaminophen (PERCOCET/ROXICET) 5-325 MG tablet, Take 1 tablet by mouth every 6 (six) hours as needed., Disp: 10 tablet, Rfl: 0 .  pravastatin (PRAVACHOL) 20 MG tablet, Take 20 mg by mouth daily. , Disp: , Rfl:   Allergies  Allergen Reactions  . Penicillins Nausea And Vomiting and Other (See Comments)    Has patient had a PCN reaction causing immediate rash, facial/tongue/throat swelling, SOB or lightheadedness with hypotension: No Has patient had a PCN reaction causing severe rash involving mucus membranes or skin necrosis: No Has patient had a PCN reaction that required hospitalization No Has patient had a PCN reaction occurring within the last 10 years: No If all of the above  answers are "NO", then may proceed with Cephalosporin use.  . Vioxx [Rofecoxib] Nausea And Vomiting    Objective: Vitals:   05/25/19 1133  Temp: 98.5 F (36.9 C)    Vascular Examination: Capillary refill time delayed x 10 digits.  Dorsalis pedis palpable b/l LE.  Posterior tibial pulses palpable b/l.  Digital hair absent x 10 digits.  Skin temperature gradient WNL b/l.  Dermatological Examination: Skin with normal turgor, texture and tone b/l.  Dressing (ace bandage) noted left ankle. Clean, dry and intact.   Toenails 1-5 b/l discolored, thick, dystrophic with subungual debris and pain with palpation to nailbeds due to thickness of  nails.  Musculoskeletal: Muscle strength 5/5 to all LE muscle groups b/l.  Charcot deformity noted left ankle.  No pain, crepitus or joint limitation noted with ROM.   Neurological: Sensation diminished b/l with 10 gram monofilament.  Vibratory sensation intact.  Assessment: Painful onychomycosis toenails 1-5 b/l  NIDDM with neuropathy Charcot neuroarthropathy left ankle/foot  Plan: 1. Toenails 1-5 b/l were debrided in length and girth without iatrogenic bleeding. 2. Patient to continue soft, supportive shoe gear daily. 3. Patient to report any pedal injuries to medical professional immediately. 4. Follow up 3 months.  5. Patient/POA to call should there be a concern in the interim.

## 2019-05-28 DIAGNOSIS — E119 Type 2 diabetes mellitus without complications: Secondary | ICD-10-CM | POA: Diagnosis not present

## 2019-05-28 DIAGNOSIS — N2581 Secondary hyperparathyroidism of renal origin: Secondary | ICD-10-CM | POA: Diagnosis not present

## 2019-05-28 DIAGNOSIS — E875 Hyperkalemia: Secondary | ICD-10-CM | POA: Diagnosis not present

## 2019-05-28 DIAGNOSIS — N186 End stage renal disease: Secondary | ICD-10-CM | POA: Diagnosis not present

## 2019-05-30 DIAGNOSIS — N186 End stage renal disease: Secondary | ICD-10-CM | POA: Diagnosis not present

## 2019-05-30 DIAGNOSIS — N2581 Secondary hyperparathyroidism of renal origin: Secondary | ICD-10-CM | POA: Diagnosis not present

## 2019-05-30 DIAGNOSIS — E119 Type 2 diabetes mellitus without complications: Secondary | ICD-10-CM | POA: Diagnosis not present

## 2019-05-30 DIAGNOSIS — E875 Hyperkalemia: Secondary | ICD-10-CM | POA: Diagnosis not present

## 2019-06-01 DIAGNOSIS — E875 Hyperkalemia: Secondary | ICD-10-CM | POA: Diagnosis not present

## 2019-06-01 DIAGNOSIS — E119 Type 2 diabetes mellitus without complications: Secondary | ICD-10-CM | POA: Diagnosis not present

## 2019-06-01 DIAGNOSIS — N2581 Secondary hyperparathyroidism of renal origin: Secondary | ICD-10-CM | POA: Diagnosis not present

## 2019-06-01 DIAGNOSIS — I871 Compression of vein: Secondary | ICD-10-CM | POA: Diagnosis not present

## 2019-06-01 DIAGNOSIS — N186 End stage renal disease: Secondary | ICD-10-CM | POA: Diagnosis not present

## 2019-06-01 DIAGNOSIS — Z992 Dependence on renal dialysis: Secondary | ICD-10-CM | POA: Diagnosis not present

## 2019-06-01 DIAGNOSIS — T82868A Thrombosis of vascular prosthetic devices, implants and grafts, initial encounter: Secondary | ICD-10-CM | POA: Diagnosis not present

## 2019-06-04 DIAGNOSIS — N186 End stage renal disease: Secondary | ICD-10-CM | POA: Diagnosis not present

## 2019-06-04 DIAGNOSIS — I871 Compression of vein: Secondary | ICD-10-CM | POA: Diagnosis not present

## 2019-06-04 DIAGNOSIS — Z992 Dependence on renal dialysis: Secondary | ICD-10-CM | POA: Diagnosis not present

## 2019-06-06 DIAGNOSIS — E119 Type 2 diabetes mellitus without complications: Secondary | ICD-10-CM | POA: Diagnosis not present

## 2019-06-06 DIAGNOSIS — N2581 Secondary hyperparathyroidism of renal origin: Secondary | ICD-10-CM | POA: Diagnosis not present

## 2019-06-06 DIAGNOSIS — N186 End stage renal disease: Secondary | ICD-10-CM | POA: Diagnosis not present

## 2019-06-06 DIAGNOSIS — E875 Hyperkalemia: Secondary | ICD-10-CM | POA: Diagnosis not present

## 2019-06-08 DIAGNOSIS — N186 End stage renal disease: Secondary | ICD-10-CM | POA: Diagnosis not present

## 2019-06-08 DIAGNOSIS — E119 Type 2 diabetes mellitus without complications: Secondary | ICD-10-CM | POA: Diagnosis not present

## 2019-06-08 DIAGNOSIS — N2581 Secondary hyperparathyroidism of renal origin: Secondary | ICD-10-CM | POA: Diagnosis not present

## 2019-06-08 DIAGNOSIS — E875 Hyperkalemia: Secondary | ICD-10-CM | POA: Diagnosis not present

## 2019-06-11 DIAGNOSIS — N186 End stage renal disease: Secondary | ICD-10-CM | POA: Diagnosis not present

## 2019-06-11 DIAGNOSIS — N2581 Secondary hyperparathyroidism of renal origin: Secondary | ICD-10-CM | POA: Diagnosis not present

## 2019-06-11 DIAGNOSIS — E119 Type 2 diabetes mellitus without complications: Secondary | ICD-10-CM | POA: Diagnosis not present

## 2019-06-11 DIAGNOSIS — E875 Hyperkalemia: Secondary | ICD-10-CM | POA: Diagnosis not present

## 2019-06-12 DIAGNOSIS — H40013 Open angle with borderline findings, low risk, bilateral: Secondary | ICD-10-CM | POA: Diagnosis not present

## 2019-06-12 DIAGNOSIS — Z961 Presence of intraocular lens: Secondary | ICD-10-CM | POA: Diagnosis not present

## 2019-06-13 DIAGNOSIS — N186 End stage renal disease: Secondary | ICD-10-CM | POA: Diagnosis not present

## 2019-06-13 DIAGNOSIS — E119 Type 2 diabetes mellitus without complications: Secondary | ICD-10-CM | POA: Diagnosis not present

## 2019-06-13 DIAGNOSIS — E875 Hyperkalemia: Secondary | ICD-10-CM | POA: Diagnosis not present

## 2019-06-13 DIAGNOSIS — N2581 Secondary hyperparathyroidism of renal origin: Secondary | ICD-10-CM | POA: Diagnosis not present

## 2019-06-15 DIAGNOSIS — E875 Hyperkalemia: Secondary | ICD-10-CM | POA: Diagnosis not present

## 2019-06-15 DIAGNOSIS — E119 Type 2 diabetes mellitus without complications: Secondary | ICD-10-CM | POA: Diagnosis not present

## 2019-06-15 DIAGNOSIS — N2581 Secondary hyperparathyroidism of renal origin: Secondary | ICD-10-CM | POA: Diagnosis not present

## 2019-06-15 DIAGNOSIS — N186 End stage renal disease: Secondary | ICD-10-CM | POA: Diagnosis not present

## 2019-06-16 DIAGNOSIS — E1129 Type 2 diabetes mellitus with other diabetic kidney complication: Secondary | ICD-10-CM | POA: Diagnosis not present

## 2019-06-16 DIAGNOSIS — Z992 Dependence on renal dialysis: Secondary | ICD-10-CM | POA: Diagnosis not present

## 2019-06-16 DIAGNOSIS — N186 End stage renal disease: Secondary | ICD-10-CM | POA: Diagnosis not present

## 2019-06-18 DIAGNOSIS — Z992 Dependence on renal dialysis: Secondary | ICD-10-CM | POA: Diagnosis not present

## 2019-06-18 DIAGNOSIS — N2581 Secondary hyperparathyroidism of renal origin: Secondary | ICD-10-CM | POA: Diagnosis not present

## 2019-06-18 DIAGNOSIS — N186 End stage renal disease: Secondary | ICD-10-CM | POA: Diagnosis not present

## 2019-06-18 DIAGNOSIS — E119 Type 2 diabetes mellitus without complications: Secondary | ICD-10-CM | POA: Diagnosis not present

## 2019-06-20 DIAGNOSIS — Z992 Dependence on renal dialysis: Secondary | ICD-10-CM | POA: Diagnosis not present

## 2019-06-20 DIAGNOSIS — N186 End stage renal disease: Secondary | ICD-10-CM | POA: Diagnosis not present

## 2019-06-20 DIAGNOSIS — E119 Type 2 diabetes mellitus without complications: Secondary | ICD-10-CM | POA: Diagnosis not present

## 2019-06-20 DIAGNOSIS — N2581 Secondary hyperparathyroidism of renal origin: Secondary | ICD-10-CM | POA: Diagnosis not present

## 2019-06-22 DIAGNOSIS — Z992 Dependence on renal dialysis: Secondary | ICD-10-CM | POA: Diagnosis not present

## 2019-06-22 DIAGNOSIS — N186 End stage renal disease: Secondary | ICD-10-CM | POA: Diagnosis not present

## 2019-06-22 DIAGNOSIS — E119 Type 2 diabetes mellitus without complications: Secondary | ICD-10-CM | POA: Diagnosis not present

## 2019-06-22 DIAGNOSIS — N2581 Secondary hyperparathyroidism of renal origin: Secondary | ICD-10-CM | POA: Diagnosis not present

## 2019-06-25 DIAGNOSIS — Z992 Dependence on renal dialysis: Secondary | ICD-10-CM | POA: Diagnosis not present

## 2019-06-25 DIAGNOSIS — E119 Type 2 diabetes mellitus without complications: Secondary | ICD-10-CM | POA: Diagnosis not present

## 2019-06-25 DIAGNOSIS — N186 End stage renal disease: Secondary | ICD-10-CM | POA: Diagnosis not present

## 2019-06-25 DIAGNOSIS — N2581 Secondary hyperparathyroidism of renal origin: Secondary | ICD-10-CM | POA: Diagnosis not present

## 2019-06-27 DIAGNOSIS — Z992 Dependence on renal dialysis: Secondary | ICD-10-CM | POA: Diagnosis not present

## 2019-06-27 DIAGNOSIS — N186 End stage renal disease: Secondary | ICD-10-CM | POA: Diagnosis not present

## 2019-06-27 DIAGNOSIS — N2581 Secondary hyperparathyroidism of renal origin: Secondary | ICD-10-CM | POA: Diagnosis not present

## 2019-06-27 DIAGNOSIS — E119 Type 2 diabetes mellitus without complications: Secondary | ICD-10-CM | POA: Diagnosis not present

## 2019-06-29 DIAGNOSIS — N2581 Secondary hyperparathyroidism of renal origin: Secondary | ICD-10-CM | POA: Diagnosis not present

## 2019-06-29 DIAGNOSIS — Z992 Dependence on renal dialysis: Secondary | ICD-10-CM | POA: Diagnosis not present

## 2019-06-29 DIAGNOSIS — N186 End stage renal disease: Secondary | ICD-10-CM | POA: Diagnosis not present

## 2019-06-29 DIAGNOSIS — E119 Type 2 diabetes mellitus without complications: Secondary | ICD-10-CM | POA: Diagnosis not present

## 2019-07-02 DIAGNOSIS — N2581 Secondary hyperparathyroidism of renal origin: Secondary | ICD-10-CM | POA: Diagnosis not present

## 2019-07-02 DIAGNOSIS — E119 Type 2 diabetes mellitus without complications: Secondary | ICD-10-CM | POA: Diagnosis not present

## 2019-07-02 DIAGNOSIS — N186 End stage renal disease: Secondary | ICD-10-CM | POA: Diagnosis not present

## 2019-07-02 DIAGNOSIS — Z992 Dependence on renal dialysis: Secondary | ICD-10-CM | POA: Diagnosis not present

## 2019-07-04 DIAGNOSIS — E119 Type 2 diabetes mellitus without complications: Secondary | ICD-10-CM | POA: Diagnosis not present

## 2019-07-04 DIAGNOSIS — N2581 Secondary hyperparathyroidism of renal origin: Secondary | ICD-10-CM | POA: Diagnosis not present

## 2019-07-04 DIAGNOSIS — Z992 Dependence on renal dialysis: Secondary | ICD-10-CM | POA: Diagnosis not present

## 2019-07-04 DIAGNOSIS — N186 End stage renal disease: Secondary | ICD-10-CM | POA: Diagnosis not present

## 2019-07-06 DIAGNOSIS — Z992 Dependence on renal dialysis: Secondary | ICD-10-CM | POA: Diagnosis not present

## 2019-07-06 DIAGNOSIS — N2581 Secondary hyperparathyroidism of renal origin: Secondary | ICD-10-CM | POA: Diagnosis not present

## 2019-07-06 DIAGNOSIS — E119 Type 2 diabetes mellitus without complications: Secondary | ICD-10-CM | POA: Diagnosis not present

## 2019-07-06 DIAGNOSIS — N186 End stage renal disease: Secondary | ICD-10-CM | POA: Diagnosis not present

## 2019-07-09 DIAGNOSIS — N186 End stage renal disease: Secondary | ICD-10-CM | POA: Diagnosis not present

## 2019-07-09 DIAGNOSIS — E119 Type 2 diabetes mellitus without complications: Secondary | ICD-10-CM | POA: Diagnosis not present

## 2019-07-09 DIAGNOSIS — Z992 Dependence on renal dialysis: Secondary | ICD-10-CM | POA: Diagnosis not present

## 2019-07-09 DIAGNOSIS — N2581 Secondary hyperparathyroidism of renal origin: Secondary | ICD-10-CM | POA: Diagnosis not present

## 2019-07-11 DIAGNOSIS — E119 Type 2 diabetes mellitus without complications: Secondary | ICD-10-CM | POA: Diagnosis not present

## 2019-07-11 DIAGNOSIS — Z992 Dependence on renal dialysis: Secondary | ICD-10-CM | POA: Diagnosis not present

## 2019-07-11 DIAGNOSIS — N186 End stage renal disease: Secondary | ICD-10-CM | POA: Diagnosis not present

## 2019-07-11 DIAGNOSIS — N2581 Secondary hyperparathyroidism of renal origin: Secondary | ICD-10-CM | POA: Diagnosis not present

## 2019-07-13 DIAGNOSIS — Z992 Dependence on renal dialysis: Secondary | ICD-10-CM | POA: Diagnosis not present

## 2019-07-13 DIAGNOSIS — N186 End stage renal disease: Secondary | ICD-10-CM | POA: Diagnosis not present

## 2019-07-13 DIAGNOSIS — N2581 Secondary hyperparathyroidism of renal origin: Secondary | ICD-10-CM | POA: Diagnosis not present

## 2019-07-13 DIAGNOSIS — E119 Type 2 diabetes mellitus without complications: Secondary | ICD-10-CM | POA: Diagnosis not present

## 2019-07-16 DIAGNOSIS — Z992 Dependence on renal dialysis: Secondary | ICD-10-CM | POA: Diagnosis not present

## 2019-07-16 DIAGNOSIS — E119 Type 2 diabetes mellitus without complications: Secondary | ICD-10-CM | POA: Diagnosis not present

## 2019-07-16 DIAGNOSIS — N186 End stage renal disease: Secondary | ICD-10-CM | POA: Diagnosis not present

## 2019-07-16 DIAGNOSIS — N2581 Secondary hyperparathyroidism of renal origin: Secondary | ICD-10-CM | POA: Diagnosis not present

## 2019-07-17 DIAGNOSIS — E1129 Type 2 diabetes mellitus with other diabetic kidney complication: Secondary | ICD-10-CM | POA: Diagnosis not present

## 2019-07-17 DIAGNOSIS — Z992 Dependence on renal dialysis: Secondary | ICD-10-CM | POA: Diagnosis not present

## 2019-07-17 DIAGNOSIS — N186 End stage renal disease: Secondary | ICD-10-CM | POA: Diagnosis not present

## 2019-07-18 DIAGNOSIS — Z23 Encounter for immunization: Secondary | ICD-10-CM | POA: Diagnosis not present

## 2019-07-18 DIAGNOSIS — N2581 Secondary hyperparathyroidism of renal origin: Secondary | ICD-10-CM | POA: Diagnosis not present

## 2019-07-18 DIAGNOSIS — Z992 Dependence on renal dialysis: Secondary | ICD-10-CM | POA: Diagnosis not present

## 2019-07-18 DIAGNOSIS — N186 End stage renal disease: Secondary | ICD-10-CM | POA: Diagnosis not present

## 2019-07-18 DIAGNOSIS — E119 Type 2 diabetes mellitus without complications: Secondary | ICD-10-CM | POA: Diagnosis not present

## 2019-07-20 DIAGNOSIS — E119 Type 2 diabetes mellitus without complications: Secondary | ICD-10-CM | POA: Diagnosis not present

## 2019-07-20 DIAGNOSIS — Z992 Dependence on renal dialysis: Secondary | ICD-10-CM | POA: Diagnosis not present

## 2019-07-20 DIAGNOSIS — N186 End stage renal disease: Secondary | ICD-10-CM | POA: Diagnosis not present

## 2019-07-20 DIAGNOSIS — Z23 Encounter for immunization: Secondary | ICD-10-CM | POA: Diagnosis not present

## 2019-07-20 DIAGNOSIS — N2581 Secondary hyperparathyroidism of renal origin: Secondary | ICD-10-CM | POA: Diagnosis not present

## 2019-07-23 DIAGNOSIS — N2581 Secondary hyperparathyroidism of renal origin: Secondary | ICD-10-CM | POA: Diagnosis not present

## 2019-07-23 DIAGNOSIS — Z23 Encounter for immunization: Secondary | ICD-10-CM | POA: Diagnosis not present

## 2019-07-23 DIAGNOSIS — E119 Type 2 diabetes mellitus without complications: Secondary | ICD-10-CM | POA: Diagnosis not present

## 2019-07-23 DIAGNOSIS — N186 End stage renal disease: Secondary | ICD-10-CM | POA: Diagnosis not present

## 2019-07-23 DIAGNOSIS — Z992 Dependence on renal dialysis: Secondary | ICD-10-CM | POA: Diagnosis not present

## 2019-07-25 DIAGNOSIS — E119 Type 2 diabetes mellitus without complications: Secondary | ICD-10-CM | POA: Diagnosis not present

## 2019-07-25 DIAGNOSIS — Z23 Encounter for immunization: Secondary | ICD-10-CM | POA: Diagnosis not present

## 2019-07-25 DIAGNOSIS — N186 End stage renal disease: Secondary | ICD-10-CM | POA: Diagnosis not present

## 2019-07-25 DIAGNOSIS — N2581 Secondary hyperparathyroidism of renal origin: Secondary | ICD-10-CM | POA: Diagnosis not present

## 2019-07-25 DIAGNOSIS — Z992 Dependence on renal dialysis: Secondary | ICD-10-CM | POA: Diagnosis not present

## 2019-07-27 DIAGNOSIS — Z23 Encounter for immunization: Secondary | ICD-10-CM | POA: Diagnosis not present

## 2019-07-27 DIAGNOSIS — N186 End stage renal disease: Secondary | ICD-10-CM | POA: Diagnosis not present

## 2019-07-27 DIAGNOSIS — N2581 Secondary hyperparathyroidism of renal origin: Secondary | ICD-10-CM | POA: Diagnosis not present

## 2019-07-27 DIAGNOSIS — E119 Type 2 diabetes mellitus without complications: Secondary | ICD-10-CM | POA: Diagnosis not present

## 2019-07-27 DIAGNOSIS — Z992 Dependence on renal dialysis: Secondary | ICD-10-CM | POA: Diagnosis not present

## 2019-07-30 ENCOUNTER — Other Ambulatory Visit: Payer: Self-pay | Admitting: *Deleted

## 2019-07-30 DIAGNOSIS — T82868A Thrombosis of vascular prosthetic devices, implants and grafts, initial encounter: Secondary | ICD-10-CM | POA: Diagnosis not present

## 2019-07-30 DIAGNOSIS — Z992 Dependence on renal dialysis: Secondary | ICD-10-CM | POA: Diagnosis not present

## 2019-07-30 DIAGNOSIS — N186 End stage renal disease: Secondary | ICD-10-CM | POA: Diagnosis not present

## 2019-07-31 ENCOUNTER — Other Ambulatory Visit (HOSPITAL_COMMUNITY)
Admission: RE | Admit: 2019-07-31 | Discharge: 2019-07-31 | Disposition: A | Discharge status: Home / Self Care | Payer: Medicare Other | Source: Ambulatory Visit | Attending: Vascular Surgery | Admitting: Vascular Surgery

## 2019-07-31 DIAGNOSIS — Z01812 Encounter for preprocedural laboratory examination: Secondary | ICD-10-CM | POA: Diagnosis not present

## 2019-07-31 DIAGNOSIS — Z20828 Contact with and (suspected) exposure to other viral communicable diseases: Secondary | ICD-10-CM | POA: Diagnosis not present

## 2019-07-31 LAB — SARS CORONAVIRUS 2 (TAT 6-24 HRS): SARS Coronavirus 2: NEGATIVE

## 2019-08-01 ENCOUNTER — Encounter (HOSPITAL_COMMUNITY): Payer: Self-pay | Admitting: *Deleted

## 2019-08-01 ENCOUNTER — Other Ambulatory Visit: Payer: Self-pay

## 2019-08-01 DIAGNOSIS — E119 Type 2 diabetes mellitus without complications: Secondary | ICD-10-CM | POA: Diagnosis not present

## 2019-08-01 DIAGNOSIS — Z23 Encounter for immunization: Secondary | ICD-10-CM | POA: Diagnosis not present

## 2019-08-01 DIAGNOSIS — Z992 Dependence on renal dialysis: Secondary | ICD-10-CM | POA: Diagnosis not present

## 2019-08-01 DIAGNOSIS — N2581 Secondary hyperparathyroidism of renal origin: Secondary | ICD-10-CM | POA: Diagnosis not present

## 2019-08-01 DIAGNOSIS — N186 End stage renal disease: Secondary | ICD-10-CM | POA: Diagnosis not present

## 2019-08-01 MED ORDER — VANCOMYCIN HCL 10 G IV SOLR
1500.0000 mg | INTRAVENOUS | Status: AC
  Administered 2019-08-02: 1500 mg via INTRAVENOUS
  Filled 2019-08-01: qty 1500

## 2019-08-01 NOTE — Progress Notes (Signed)
Pt denies SOB and chest pain but stated that he cannot remember the name of his cardiologist " I went to [place]." Pt denies having a cardiac cath but stated that a stress test was recently performed at the [facility] in [place]; nurse requested records and awaiting response. Pt made aware to stop taking vitamins, fish oil and herbal medications. Do not take any NSAIDs ie: Ibuprofen, Advil, Naproxen BC and Goody Powder. Pt made aware to take 10 units of Levemir the night before surgery. Pt made aware to take no insulin the morning of surgery, check his CBG every 2 hours prior to arrival to hospital, treat a CBG < 70 with 4 ounces of Apple or Cranberry Juice, wait 15 minutes after drinking the juice to recheck CBG, if it remains < 70 call and speak with a nurse on the SS unit. Pt verbalized understanding of all pre-op instructions.

## 2019-08-02 ENCOUNTER — Ambulatory Visit (HOSPITAL_COMMUNITY)
Admission: RE | Admit: 2019-08-02 | Discharge: 2019-08-02 | Disposition: A | Discharge status: Home / Self Care | Payer: Medicare Other | Source: Ambulatory Visit | Attending: Vascular Surgery | Admitting: Vascular Surgery

## 2019-08-02 ENCOUNTER — Ambulatory Visit (HOSPITAL_COMMUNITY): Payer: Medicare Other | Admitting: Certified Registered Nurse Anesthetist

## 2019-08-02 ENCOUNTER — Encounter (HOSPITAL_COMMUNITY): Payer: Self-pay

## 2019-08-02 ENCOUNTER — Encounter (HOSPITAL_COMMUNITY)
Admission: RE | Disposition: A | Discharge status: Home / Self Care | Payer: Self-pay | Source: Ambulatory Visit | Attending: Vascular Surgery

## 2019-08-02 DIAGNOSIS — M109 Gout, unspecified: Secondary | ICD-10-CM | POA: Diagnosis not present

## 2019-08-02 DIAGNOSIS — B1920 Unspecified viral hepatitis C without hepatic coma: Secondary | ICD-10-CM | POA: Insufficient documentation

## 2019-08-02 DIAGNOSIS — Z7682 Awaiting organ transplant status: Secondary | ICD-10-CM | POA: Diagnosis not present

## 2019-08-02 DIAGNOSIS — T82590A Other mechanical complication of surgically created arteriovenous fistula, initial encounter: Secondary | ICD-10-CM | POA: Diagnosis not present

## 2019-08-02 DIAGNOSIS — E785 Hyperlipidemia, unspecified: Secondary | ICD-10-CM | POA: Diagnosis not present

## 2019-08-02 DIAGNOSIS — I509 Heart failure, unspecified: Secondary | ICD-10-CM | POA: Insufficient documentation

## 2019-08-02 DIAGNOSIS — Z880 Allergy status to penicillin: Secondary | ICD-10-CM | POA: Insufficient documentation

## 2019-08-02 DIAGNOSIS — K219 Gastro-esophageal reflux disease without esophagitis: Secondary | ICD-10-CM | POA: Insufficient documentation

## 2019-08-02 DIAGNOSIS — T82898A Other specified complication of vascular prosthetic devices, implants and grafts, initial encounter: Secondary | ICD-10-CM | POA: Diagnosis not present

## 2019-08-02 DIAGNOSIS — Z888 Allergy status to other drugs, medicaments and biological substances status: Secondary | ICD-10-CM | POA: Diagnosis not present

## 2019-08-02 DIAGNOSIS — H9193 Unspecified hearing loss, bilateral: Secondary | ICD-10-CM | POA: Insufficient documentation

## 2019-08-02 DIAGNOSIS — T82868A Thrombosis of vascular prosthetic devices, implants and grafts, initial encounter: Secondary | ICD-10-CM | POA: Diagnosis not present

## 2019-08-02 DIAGNOSIS — E1122 Type 2 diabetes mellitus with diabetic chronic kidney disease: Secondary | ICD-10-CM | POA: Diagnosis not present

## 2019-08-02 DIAGNOSIS — Z794 Long term (current) use of insulin: Secondary | ICD-10-CM | POA: Insufficient documentation

## 2019-08-02 DIAGNOSIS — D472 Monoclonal gammopathy: Secondary | ICD-10-CM | POA: Diagnosis not present

## 2019-08-02 DIAGNOSIS — R51 Headache: Secondary | ICD-10-CM | POA: Insufficient documentation

## 2019-08-02 DIAGNOSIS — G4730 Sleep apnea, unspecified: Secondary | ICD-10-CM | POA: Diagnosis not present

## 2019-08-02 DIAGNOSIS — N186 End stage renal disease: Secondary | ICD-10-CM | POA: Diagnosis not present

## 2019-08-02 DIAGNOSIS — G709 Myoneural disorder, unspecified: Secondary | ICD-10-CM | POA: Diagnosis not present

## 2019-08-02 DIAGNOSIS — I120 Hypertensive chronic kidney disease with stage 5 chronic kidney disease or end stage renal disease: Secondary | ICD-10-CM | POA: Diagnosis not present

## 2019-08-02 DIAGNOSIS — Z992 Dependence on renal dialysis: Secondary | ICD-10-CM | POA: Insufficient documentation

## 2019-08-02 DIAGNOSIS — Z6839 Body mass index (BMI) 39.0-39.9, adult: Secondary | ICD-10-CM | POA: Diagnosis not present

## 2019-08-02 DIAGNOSIS — Z87891 Personal history of nicotine dependence: Secondary | ICD-10-CM | POA: Diagnosis not present

## 2019-08-02 DIAGNOSIS — Z79899 Other long term (current) drug therapy: Secondary | ICD-10-CM | POA: Diagnosis not present

## 2019-08-02 DIAGNOSIS — E669 Obesity, unspecified: Secondary | ICD-10-CM | POA: Insufficient documentation

## 2019-08-02 DIAGNOSIS — I132 Hypertensive heart and chronic kidney disease with heart failure and with stage 5 chronic kidney disease, or end stage renal disease: Secondary | ICD-10-CM | POA: Insufficient documentation

## 2019-08-02 HISTORY — PX: REVISON OF ARTERIOVENOUS FISTULA: SHX6074

## 2019-08-02 HISTORY — DX: Presence of external hearing-aid: Z97.4

## 2019-08-02 HISTORY — DX: Unspecified hearing loss, unspecified ear: H91.90

## 2019-08-02 LAB — GLUCOSE, CAPILLARY
Glucose-Capillary: 99 mg/dL (ref 70–99)
Glucose-Capillary: 97 mg/dL (ref 70–99)

## 2019-08-02 LAB — POCT I-STAT, CHEM 8
BUN: 42 mg/dL — ABNORMAL HIGH (ref 8–23)
Calcium, Ion: 0.99 mmol/L — ABNORMAL LOW (ref 1.15–1.40)
Chloride: 100 mmol/L (ref 98–111)
Creatinine, Ser: 13.5 mg/dL — ABNORMAL HIGH (ref 0.61–1.24)
Glucose, Bld: 101 mg/dL — ABNORMAL HIGH (ref 70–99)
HCT: 48 % (ref 39.0–52.0)
Hemoglobin: 16.3 g/dL (ref 13.0–17.0)
Potassium: 5.3 mmol/L — ABNORMAL HIGH (ref 3.5–5.1)
Sodium: 133 mmol/L — ABNORMAL LOW (ref 135–145)
TCO2: 23 mmol/L (ref 22–32)

## 2019-08-02 LAB — CBC
HCT: 45.5 % (ref 39.0–52.0)
Hemoglobin: 14.3 g/dL (ref 13.0–17.0)
MCH: 25.4 pg — ABNORMAL LOW (ref 26.0–34.0)
MCHC: 31.4 g/dL (ref 30.0–36.0)
MCV: 80.8 fL (ref 80.0–100.0)
Platelets: 185 10*3/uL (ref 150–400)
RBC: 5.63 MIL/uL (ref 4.22–5.81)
RDW: 18.4 % — ABNORMAL HIGH (ref 11.5–15.5)
WBC: 7.2 10*3/uL (ref 4.0–10.5)
nRBC: 0 % (ref 0.0–0.2)

## 2019-08-02 SURGERY — REVISON OF ARTERIOVENOUS FISTULA
Anesthesia: General | Site: Arm Lower | Laterality: Right

## 2019-08-02 MED ORDER — ACETAMINOPHEN 500 MG PO TABS: 1000.0000 mg | ORAL_TABLET | Freq: Once | ORAL | Status: DC | PRN

## 2019-08-02 MED ORDER — PROPOFOL 10 MG/ML IV BOLUS
INTRAVENOUS | Status: AC
  Filled 2019-08-02: qty 20

## 2019-08-02 MED ORDER — FENTANYL CITRATE (PF) 250 MCG/5ML IJ SOLN
INTRAMUSCULAR | Status: AC
  Filled 2019-08-02: qty 5

## 2019-08-02 MED ORDER — LIDOCAINE 2% (20 MG/ML) 5 ML SYRINGE
INTRAMUSCULAR | Status: DC | PRN
  Administered 2019-08-02: 100 mg via INTRAVENOUS

## 2019-08-02 MED ORDER — OXYCODONE HCL 5 MG/5ML PO SOLN: 5.0000 mg | Freq: Once | ORAL | Status: AC | PRN

## 2019-08-02 MED ORDER — LIDOCAINE HCL 1 % IJ SOLN
INTRAMUSCULAR | Status: AC
  Filled 2019-08-02: qty 20

## 2019-08-02 MED ORDER — OXYCODONE HCL 5 MG PO TABS
ORAL_TABLET | ORAL | Status: AC
  Filled 2019-08-02: qty 1

## 2019-08-02 MED ORDER — 0.9 % SODIUM CHLORIDE (POUR BTL) OPTIME
TOPICAL | Status: DC | PRN
  Administered 2019-08-02: 1000 mL

## 2019-08-02 MED ORDER — SODIUM CHLORIDE 0.9 % IV SOLN
INTRAVENOUS | Status: DC
  Administered 2019-08-02: 08:00:00 via INTRAVENOUS

## 2019-08-02 MED ORDER — FENTANYL CITRATE (PF) 100 MCG/2ML IJ SOLN
INTRAMUSCULAR | Status: DC | PRN
  Administered 2019-08-02 (×4): 50 ug via INTRAVENOUS

## 2019-08-02 MED ORDER — MIDAZOLAM HCL 2 MG/2ML IJ SOLN
INTRAMUSCULAR | Status: AC
  Filled 2019-08-02: qty 2

## 2019-08-02 MED ORDER — ONDANSETRON HCL 4 MG/2ML IJ SOLN
INTRAMUSCULAR | Status: DC | PRN
  Administered 2019-08-02: 4 mg via INTRAVENOUS

## 2019-08-02 MED ORDER — FENTANYL CITRATE (PF) 100 MCG/2ML IJ SOLN
INTRAMUSCULAR | Status: AC
  Administered 2019-08-02: 25 ug via INTRAVENOUS
  Filled 2019-08-02: qty 2

## 2019-08-02 MED ORDER — DEXAMETHASONE SODIUM PHOSPHATE 10 MG/ML IJ SOLN
INTRAMUSCULAR | Status: DC | PRN
  Administered 2019-08-02: 4 mg via INTRAVENOUS

## 2019-08-02 MED ORDER — ACETAMINOPHEN 160 MG/5ML PO SOLN: 1000.0000 mg | Freq: Once | ORAL | Status: DC | PRN

## 2019-08-02 MED ORDER — PROPOFOL 10 MG/ML IV BOLUS
INTRAVENOUS | Status: DC | PRN
  Administered 2019-08-02: 150 mg via INTRAVENOUS
  Administered 2019-08-02: 20 mg via INTRAVENOUS

## 2019-08-02 MED ORDER — HYDROCODONE-ACETAMINOPHEN 5-325 MG PO TABS: 1.0000 | ORAL_TABLET | Freq: Four times a day (QID) | ORAL | 0 refills | Status: DC | PRN

## 2019-08-02 MED ORDER — SODIUM CHLORIDE 0.9 % IV SOLN
INTRAVENOUS | Status: AC
  Filled 2019-08-02: qty 1.2

## 2019-08-02 MED ORDER — ACETAMINOPHEN 10 MG/ML IV SOLN
INTRAVENOUS | Status: AC
  Administered 2019-08-02: 1000 mg via INTRAVENOUS
  Filled 2019-08-02: qty 100

## 2019-08-02 MED ORDER — ACETAMINOPHEN 10 MG/ML IV SOLN
1000.0000 mg | Freq: Once | INTRAVENOUS | Status: DC | PRN
  Administered 2019-08-02: 11:00:00 1000 mg via INTRAVENOUS

## 2019-08-02 MED ORDER — SODIUM CHLORIDE 0.9 % IV SOLN
INTRAVENOUS | Status: DC | PRN
  Administered 2019-08-02: 25 ug/min via INTRAVENOUS

## 2019-08-02 MED ORDER — MIDAZOLAM HCL 5 MG/5ML IJ SOLN
INTRAMUSCULAR | Status: DC | PRN
  Administered 2019-08-02: 1 mg via INTRAVENOUS

## 2019-08-02 MED ORDER — SODIUM CHLORIDE 0.9 % IV SOLN
INTRAVENOUS | Status: DC | PRN
  Administered 2019-08-02: 500 mL

## 2019-08-02 MED ORDER — PHENYLEPHRINE 40 MCG/ML (10ML) SYRINGE FOR IV PUSH (FOR BLOOD PRESSURE SUPPORT)
PREFILLED_SYRINGE | INTRAVENOUS | Status: AC
  Filled 2019-08-02: qty 10

## 2019-08-02 MED ORDER — PHENYLEPHRINE 40 MCG/ML (10ML) SYRINGE FOR IV PUSH (FOR BLOOD PRESSURE SUPPORT)
PREFILLED_SYRINGE | INTRAVENOUS | Status: DC | PRN
  Administered 2019-08-02 (×2): 80 ug via INTRAVENOUS

## 2019-08-02 MED ORDER — OXYCODONE HCL 5 MG PO TABS
5.0000 mg | ORAL_TABLET | Freq: Once | ORAL | Status: AC | PRN
  Administered 2019-08-02: 5 mg via ORAL

## 2019-08-02 MED ORDER — FENTANYL CITRATE (PF) 100 MCG/2ML IJ SOLN
25.0000 ug | INTRAMUSCULAR | Status: DC | PRN
  Administered 2019-08-02 (×3): 25 ug via INTRAVENOUS

## 2019-08-02 SURGICAL SUPPLY — 31 items
ARMBAND PINK RESTRICT EXTREMIT (MISCELLANEOUS) ×2 IMPLANT
CANISTER SUCT 3000ML PPV (MISCELLANEOUS) ×2 IMPLANT
CLIP VESOCCLUDE MED 6/CT (CLIP) ×2 IMPLANT
CLIP VESOCCLUDE SM WIDE 6/CT (CLIP) ×2 IMPLANT
COVER PROBE W GEL 5X96 (DRAPES) ×2 IMPLANT
COVER WAND RF STERILE (DRAPES) ×1 IMPLANT
DERMABOND ADVANCED (GAUZE/BANDAGES/DRESSINGS) ×1
DERMABOND ADVANCED .7 DNX12 (GAUZE/BANDAGES/DRESSINGS) ×1 IMPLANT
ELECT REM PT RETURN 9FT ADLT (ELECTROSURGICAL) ×2
ELECTRODE REM PT RTRN 9FT ADLT (ELECTROSURGICAL) ×1 IMPLANT
GLOVE BIO SURGEON STRL SZ7.5 (GLOVE) ×3 IMPLANT
GLOVE ECLIPSE 7.5 STRL STRAW (GLOVE) ×1 IMPLANT
GOWN STRL REUS W/ TWL LRG LVL3 (GOWN DISPOSABLE) ×2 IMPLANT
GOWN STRL REUS W/ TWL XL LVL3 (GOWN DISPOSABLE) ×1 IMPLANT
GOWN STRL REUS W/TWL LRG LVL3 (GOWN DISPOSABLE) ×1
GOWN STRL REUS W/TWL XL LVL3 (GOWN DISPOSABLE) ×3
GRAFT COLLAGEN VASCULAR 7X40 (Vascular Products) ×1 IMPLANT
KIT BASIN OR (CUSTOM PROCEDURE TRAY) ×2 IMPLANT
KIT TURNOVER KIT B (KITS) ×2 IMPLANT
NS IRRIG 1000ML POUR BTL (IV SOLUTION) ×2 IMPLANT
PACK CV ACCESS (CUSTOM PROCEDURE TRAY) ×2 IMPLANT
PAD ARMBOARD 7.5X6 YLW CONV (MISCELLANEOUS) ×4 IMPLANT
SUT MNCRL AB 4-0 PS2 18 (SUTURE) ×2 IMPLANT
SUT PROLENE 5 0 C 1 24 (SUTURE) ×2 IMPLANT
SUT PROLENE 6 0 BV (SUTURE) ×1 IMPLANT
SUT SILK 2 0 SH (SUTURE) ×1 IMPLANT
SUT VIC AB 3-0 SH 27 (SUTURE) ×2
SUT VIC AB 3-0 SH 27X BRD (SUTURE) ×1 IMPLANT
TOWEL GREEN STERILE (TOWEL DISPOSABLE) ×2 IMPLANT
UNDERPAD 30X30 (UNDERPADS AND DIAPERS) ×2 IMPLANT
WATER STERILE IRR 1000ML POUR (IV SOLUTION) ×2 IMPLANT

## 2019-08-02 NOTE — Anesthesia Procedure Notes (Signed)
Procedure Name: LMA Insertion Date/Time: 08/02/2019 9:44 AM Performed by: Colin Benton, CRNA Pre-anesthesia Checklist: Patient identified, Emergency Drugs available, Suction available and Patient being monitored Patient Re-evaluated:Patient Re-evaluated prior to induction Oxygen Delivery Method: Circle system utilized Preoxygenation: Pre-oxygenation with 100% oxygen Induction Type: IV induction Ventilation: Mask ventilation without difficulty LMA: LMA inserted LMA Size: 5.0 Number of attempts: 1 Placement Confirmation: positive ETCO2 and breath sounds checked- equal and bilateral Tube secured with: Tape Dental Injury: Teeth and Oropharynx as per pre-operative assessment

## 2019-08-02 NOTE — Transfer of Care (Signed)
Immediate Anesthesia Transfer of Care Note  Patient: [name]  Procedure(s) Performed: Artegraft placement right arm arteriovenous graft (Right Arm Lower)  Patient Location: PACU  Anesthesia Type:General  Level of Consciousness: awake, alert , oriented and patient cooperative  Airway & Oxygen Therapy: Patient Spontanous Breathing and Patient connected to face mask oxygen  Post-op Assessment: Report given to RN and Post -op Vital signs reviewed and stable  Post vital signs: Reviewed and stable  Last Vitals:  Vitals Value Taken Time  BP 127/72 08/02/19 1057  Temp    Pulse 66 08/02/19 1058  Resp 20 08/02/19 1058  SpO2 99 % 08/02/19 1058  Vitals shown include unvalidated device data.  Last Pain:  Vitals:   08/02/19 0757  TempSrc: Oral  PainSc: 0-No pain      Patients Stated Pain Goal: 3 (45/85/92 9244)  Complications: No apparent anesthesia complications

## 2019-08-02 NOTE — Op Note (Signed)
    Patient name: [name] MRN: [id number] DOB: Mar 25, 1956 Sex: male  08/02/2019 Pre-operative Diagnosis: End-stage renal disease, nonfunctional right arm AV fistula Post-operative diagnosis:  Same Surgeon:  Eda Paschal. Donzetta Matters, MD Assistant: Arlee Muslim, PA Procedure Performed:  Revision of right arm AV fistula with interposition Artegraft  Indications: 63 year old male with end-stage renal disease currently on dialysis via catheter.  He previously had a right arm radiocephalic AV fistula that has mostly thrombosed.  Is now indicated for revision.  Findings: Fistula was patent at the anastomosis and the veins were patent below the antecubitum.  Interposition Artegraft was placed.  There was some pulsatility but was patent at completion there was a palpable radial pulse the wrist confirmed with Doppler.   Procedure:  The patient was identified in the holding area and taken to the operating room was placed supine operative when general anesthesia induced.  Sterilely prepped draped in the right upper extremity usual fashion antibiotics were minister and timeout was called.  Ultrasound was used to identify the patent fistula just above the wrist anastomosis and then again at the antecubitum.  We made incisions at previous scars dissected down around the fistula proximally and distally placed Vesseloops around it.  An Artegraft was tunneled between the 2 incisions.  Near the antecubitum we clamped proximally distally and transected the vein.  We tied off distally on the arm.  We then sewed it end-to-end to the vein toward the antecubitum more proximally on the arm.  This was done with 5-0 Prolene.  We completed that we flushed through the Artegraft with heparinized saline.  We then turned our attention towards the previous anastomosis.  We clamped the fistula near the previous anastomosis and transected it.  We did evacuate the blood from the previous fistula and then tied off with 2-0 silk suture.  We  spatulated the Artegraft trimmed to size and sewed end-to-end with 5-0 Prolene suture.  Prior to completion we allowed flushing all directions.  Upon completion there was good pulsatility in the graft.  We irrigated the wounds obtain hemostasis closed in layers of Vicryl Monocryl.  He was awakened anesthesia having tolerated procedure without any complication.  All counts were correct at completion.  EBL: 50 cc   Veola Cafaro C. Donzetta Matters, MD [facility] Office: [phone] Pager: [phone]

## 2019-08-02 NOTE — Anesthesia Postprocedure Evaluation (Signed)
Anesthesia Post Note  Patient: [name]  Procedure(s) Performed: Artegraft placement right arm arteriovenous graft (Right Arm Lower)     Patient location during evaluation: PACU Anesthesia Type: General Level of consciousness: awake and alert Pain management: pain level controlled Vital Signs Assessment: post-procedure vital signs reviewed and stable Respiratory status: spontaneous breathing, nonlabored ventilation, respiratory function stable and patient connected to nasal cannula oxygen Cardiovascular status: blood pressure returned to baseline and stable Postop Assessment: no apparent nausea or vomiting Anesthetic complications: no    Last Vitals:  Vitals:   08/02/19 1130 08/02/19 1137  BP: 123/82 129/85  Pulse: 64 63  Resp: (!) 23 (!) 21  Temp: 36.4 C   SpO2: 96% 95%    Last Pain:  Vitals:   08/02/19 1144  TempSrc:   PainSc: 5                  Abhijot Straughter

## 2019-08-02 NOTE — Anesthesia Preprocedure Evaluation (Signed)
Anesthesia Evaluation  Patient identified by MRN, date of birth, ID band Patient awake    Reviewed: Allergy & Precautions, NPO status , Patient's Chart, lab work & pertinent test results, reviewed documented beta blocker date and time   History of Anesthesia Complications Negative for: history of anesthetic complications  Airway Mallampati: IV  TM Distance: >3 FB Neck ROM: Full    Dental  (+) Missing, Dental Advisory Given,    Pulmonary neg shortness of breath, sleep apnea , neg recent URI, former smoker,    breath sounds clear to auscultation       Cardiovascular hypertension, Pt. on medications and Pt. on home beta blockers (-) angina+CHF   Rhythm:Regular     Neuro/Psych  Headaches, PSYCHIATRIC DISORDERS  Neuromuscular disease    GI/Hepatic GERD  Controlled,(+) Hepatitis -, C  Endo/Other  diabetes, Insulin DependentMorbid obesity  Renal/GU ESRF and DialysisRenal disease     Musculoskeletal   Abdominal   Peds  Hematology   Anesthesia Other Findings   Reproductive/Obstetrics                             Anesthesia Physical Anesthesia Plan  ASA: IV  Anesthesia Plan: General   Post-op Pain Management:    Induction: Intravenous  PONV Risk Score and Plan: 2 and Ondansetron and Dexamethasone  Airway Management Planned: LMA  Additional Equipment: None  Intra-op Plan:   Post-operative Plan: Extubation in OR  Informed Consent: I have reviewed the patients History and Physical, chart, labs and discussed the procedure including the risks, benefits and alternatives for the proposed anesthesia with the patient or authorized representative who has indicated his/her understanding and acceptance.     Dental advisory given  Plan Discussed with: CRNA and Surgeon  Anesthesia Plan Comments:         Anesthesia Quick Evaluation

## 2019-08-02 NOTE — Discharge Instructions (Signed)
° °  [facility] ° °Discharge Instructions ° °AV Fistula or Graft Surgery for Dialysis Access ° °Please refer to the following instructions for your post-procedure care. Your surgeon or physician assistant will discuss any changes with you. ° °Activity ° °You may drive the day following your surgery, if you are comfortable and no longer taking prescription pain medication. Resume full activity as the soreness in your incision resolves. ° °Bathing/Showering ° °You may shower after you go home. Keep your incision dry for 48 hours. Do not soak in a bathtub, hot tub, or swim until the incision heals completely. You may not shower if you have a hemodialysis catheter. ° °Incision Care ° °Clean your incision with mild soap and water after 48 hours. Pat the area dry with a clean towel. You do not need a bandage unless otherwise instructed. Do not apply any ointments or creams to your incision. You may have skin glue on your incision. Do not peel it off. It will come off on its own in about one week. Your arm may swell a bit after surgery. To reduce swelling use pillows to elevate your arm so it is above your heart. Your doctor will tell you if you need to lightly wrap your arm with an ACE bandage. ° °Diet ° °Resume your normal diet. There are not special food restrictions following this procedure. In order to heal from your surgery, it is CRITICAL to get adequate nutrition. Your body requires vitamins, minerals, and protein. Vegetables are the best source of vitamins and minerals. Vegetables also provide the perfect balance of protein. Processed food has little nutritional value, so try to avoid this. ° °Medications ° °Resume taking all of your medications. If your incision is causing pain, you may take over-the counter pain relievers such as acetaminophen (Tylenol). If you were prescribed a stronger pain medication, please be aware these medications can cause nausea and constipation. Prevent  nausea by taking the medication with a snack or meal. Avoid constipation by drinking plenty of fluids and eating foods with high amount of fiber, such as fruits, vegetables, and grains. Do not take Tylenol if you are taking prescription pain medications. ° ° ° ° °Follow up °Your surgeon may want to see you in the office following your access surgery. If so, this will be arranged at the time of your surgery. ° °Please call us immediately for any of the following conditions: ° °Increased pain, redness, drainage (pus) from your incision site °Fever of 101 degrees or higher °Severe or worsening pain at your incision site °Hand pain or numbness. ° °Reduce your risk of vascular disease: ° °Stop smoking. If you would like help, call [facility] at 1-800-QUIT-NOW (1-800-784-8669) or [facility] at 336-586-4000 ° °Manage your cholesterol °Maintain a desired weight °Control your diabetes °Keep your blood pressure down ° °Dialysis ° °It will take several weeks to several months for your new dialysis access to be ready for use. Your surgeon will determine when it is OK to use it. Your nephrologist will continue to direct your dialysis. You can continue to use your Permcath until your new access is ready for use. ° °If you have any questions, please call the office at [phone]. ° °

## 2019-08-02 NOTE — Progress Notes (Signed)
Dr. Ermalene Postin aware of K 5.3, Na 133

## 2019-08-02 NOTE — H&P (Signed)
H+P    History of Present Illness: This is a 63 y.o. male history of end-stage renal disease on dialysis via forearm fistula.  He has recently noted this has thrombosed.  It is still open proximally.  He is on dialysis via catheter.  Patient adamantly does not want anything in his right upper arm as he is on a kidney transplant list.  Patient has no complaints today.  Past Medical History:  Diagnosis Date  . Alcohol abuse   . Congestive heart disease ([place]) 12/2011  . Diabetes mellitus   . GERD (gastroesophageal reflux disease)   . Gout   . Headache   . Hepatitis    being treated for Hepatitis C  . HOH (hard of hearing)   . Hyperlipidemia   . Hyperlipidemia   . Hypertension   . Monoclonal gammopathies 02/25/2012  . Obesity   . Pedal edema   . Pneumonia   . Renal insufficiency   . Sleep apnea    uses cpap  . Tobacco abuse   . Wears hearing aid    b/l    Past Surgical History:  Procedure Laterality Date  . AV FISTULA PLACEMENT Right 01/10/2014   Procedure: ARTERIOVENOUS (AV) FISTULA CREATION- RIGHT RADIOCEPHALIC ;  Surgeon: Angelia Mould, MD;  Location: [place];  Service: Vascular;  Laterality: Right;  . COLONOSCOPY    . EYE SURGERY     cataract surgery, bilateral  . Ganglion cyst removal x 2    . INSERTION OF DIALYSIS CATHETER N/A 01/07/2014   Procedure: INSERTION OF DIALYSIS CATHETER;  Surgeon: Conrad Westminster, MD;  Location: [place];  Service: Vascular;  Laterality: N/A;  . Left knee arthroscopy with generalized joint synovectomy,  01/2006  . LIGATION OF COMPETING BRANCHES OF ARTERIOVENOUS FISTULA Right 05/28/2014   Procedure: LIGATION OF COMPETING BRANCHES OF ARTERIOVENOUS FISTULA;  Surgeon: Angelia Mould, MD;  Location: [facility];  Service: Vascular;  Laterality: Right;  . REVISON OF ARTERIOVENOUS FISTULA Right 0/07/3817   Procedure: PLICATION REPAIR OF ARTERIOVENOUS FISTULA PSEUDOANEURYSM;  Surgeon: Serafina Mitchell, MD;  Location: [place] [facility];  Service: Vascular;   Laterality: Right;  . REVISON OF ARTERIOVENOUS FISTULA Right 08/08/2018   Procedure: REVISION PLICATION OF ARTERIOVENOUS FISTULA FOREARM;  Surgeon: Angelia Mould, MD;  Location: [facility];  Service: Vascular;  Laterality: Right;  . SCROTAL EXPLORATION N/A 03/11/2017   Procedure: SCROTUM EXPLORATION AND DEBRIDEMENT;  Surgeon: Franchot Gallo, MD;  Location: [facility];  Service: Urology;  Laterality: N/A;  . SCROTAL EXPLORATION N/A 03/21/2017   Procedure: REPEAT SCROTAL DEBRIDEMENT;  Surgeon: Franchot Gallo, MD;  Location: [facility];  Service: Urology;  Laterality: N/A;  . SHUNTOGRAM Right 05/14/2014   Procedure: FISTULOGRAM;  Surgeon: Serafina Mitchell, MD;  Location: [facility];  Service: Cardiovascular;  Laterality: Right;  . THROMBECTOMY W/ EMBOLECTOMY Right 07/04/2017   Procedure: EXPLORATION AND THROMBECTOMY ARTERIOVENOUS FISTULA RIGHT ARM;  Surgeon: Rosetta Posner, MD;  Location: [place];  Service: Vascular;  Laterality: Right;    Allergies  Allergen Reactions  . Penicillins Nausea And Vomiting and Other (See Comments)    Has patient had a PCN reaction causing immediate rash, facial/tongue/throat swelling, SOB or lightheadedness with hypotension: No Has patient had a PCN reaction causing severe rash involving mucus membranes or skin necrosis: No Has patient had a PCN reaction that required hospitalization No Has patient had a PCN reaction occurring within the last 10 years: No If all of the above answers are "NO", then may  proceed with Cephalosporin use.  Marland Kitchen Propolis Nausea And Vomiting  . Vioxx [Rofecoxib] Nausea And Vomiting    Prior to Admission medications   Medication Sig Start Date End Date Taking? Authorizing Provider  acetaminophen (TYLENOL) 325 MG tablet Take 650 mg by mouth every 6 (six) hours as needed for mild pain, moderate pain, fever or headache.    Yes [provider]  allopurinol (ZYLOPRIM) 100 MG tablet Take 1 tablet (100 mg total) by mouth daily. Patient taking  differently: Take 100 mg by mouth 2 (two) times daily.  03/14/17  Yes Rai, Ripudeep K, MD  amLODipine (NORVASC) 5 MG tablet Take 5 mg by mouth daily.   Yes [provider]  AURYXIA 1 GM 210 MG(Fe) tablet Take 420 mg by mouth 2 (two) times daily with a meal.  07/14/18  Yes [provider]  carvedilol (COREG) 6.25 MG tablet Take 6.25 mg by mouth 2 (two) times daily with a meal.   Yes [provider]  cinacalcet (SENSIPAR) 90 MG tablet Take 90 mg by mouth every Monday, Wednesday, and Friday.    Yes [provider]  diphenhydrAMINE (BENADRYL) 25 MG tablet Take 25 mg by mouth every Monday, Wednesday, and Friday.   Yes [provider]  furosemide (LASIX) 40 MG tablet Take 40 mg by mouth daily.   Yes [provider]  gabapentin (NEURONTIN) 100 MG capsule Take 100 mg by mouth 2 (two) times daily.    Yes [provider]  Insulin Detemir (LEVEMIR FLEXTOUCH) 100 UNIT/ML Pen Inject 20 Units into the skin at bedtime.    Yes [provider]  pravastatin (PRAVACHOL) 20 MG tablet Take 20 mg by mouth daily.    Yes [provider]  clotrimazole (LOTRIMIN) 1 % cream Apply to both feet and between toes twice daily Patient not taking: Reported on 07/30/2019 10/19/18   Marzetta Board, DPM  COLCRYS 0.6 MG tablet Take 0.6 mg by mouth daily as needed (gout).  01/28/19   [provider]    Social History   Socioeconomic History  . Marital status: Married    Spouse name: [name]  . Number of children: 2  . Years of education: 74  . Highest education level: Not on file  Occupational History  . Occupation: Security  Social Needs  . Financial resource strain: Not on file  . Food insecurity    Worry: Not on file    Inability: Not on file  . Transportation needs    Medical: Not on file    Non-medical: Not on file  Tobacco Use  . Smoking status: Former Smoker    Packs/day: 0.30    Years: 33.00    Pack years: 9.90    Types:  Cigarettes    Quit date: 07/16/2010    Years since quitting: 9.0  . Smokeless tobacco: Never Used  Substance and Sexual Activity  . Alcohol use: No  . Drug use: No  . Sexual activity: Yes  Lifestyle  . Physical activity    Days per week: Not on file    Minutes per session: Not on file  . Stress: Not on file  Relationships  . Social Herbalist on phone: Not on file    Gets together: Not on file    Attends religious service: Not on file    Active member of club or organization: Not on file    Attends meetings of clubs or organizations: Not on file  Relationship status: Not on file  . Intimate partner violence    Fear of current or ex partner: Not on file    Emotionally abused: Not on file    Physically abused: Not on file    Forced sexual activity: Not on file  Other Topics Concern  . Not on file  Social History Narrative   Married, lives in [place] with wife.    Family History  Problem Relation Age of Onset  . Diabetes Mother   . Heart disease Mother   . Lung cancer Father     ROS:  No complaints today   Physical Examination  There were no vitals filed for this visit. There is no height or weight on file to calculate BMI.  General: nad HENT: WNL Pulmonary: normal non-labored breathing Cardiac: Palpable right/left radial pulse  Extremities: right arm avf has thrombosed Musculoskeletal: no muscle wasting or atrophy  Neurologic: A&O X 3  CBC    Component Value Date/Time   WBC 17.2 (H) 03/14/2017 0444   RBC 4.02 (L) 03/14/2017 0444   HGB 15.0 08/08/2018 0837   HGB 13.5 03/02/2012 1051   HCT 44.0 08/08/2018 0837   HCT 43.2 03/02/2012 1051   PLT 309 03/14/2017 0444   PLT 206 03/02/2012 1051   MCV 76.9 (L) 03/14/2017 0444   MCV 74.9 (L) 03/02/2012 1051   MCH 24.4 (L) 03/14/2017 0444   MCHC 31.7 03/14/2017 0444   RDW 17.1 (H) 03/14/2017 0444   RDW 13.7 03/02/2012 1051   LYMPHSABS 3.7 03/10/2017 0542   LYMPHSABS 1.5 03/02/2012 1051   MONOABS 2.5  (H) 03/10/2017 0542   MONOABS 0.5 03/02/2012 1051   EOSABS 0.7 03/10/2017 0542   EOSABS 0.1 03/02/2012 1051   BASOSABS 0.0 03/10/2017 0542   BASOSABS 0.0 03/02/2012 1051    BMET    Component Value Date/Time   NA 136 08/08/2018 0837   K 4.9 08/08/2018 0837   CL 94 (L) 03/14/2017 0444   CO2 18 (L) 03/14/2017 0444   GLUCOSE 252 (H) 08/08/2018 0837   BUN 46 (H) 03/14/2017 0444   CREATININE 13.61 (H) 03/14/2017 0444   CREATININE 16.29 (H) 11/17/2016 0908   CALCIUM 8.3 (L) 03/14/2017 0444   GFRNONAA 3 (L) 03/14/2017 0444   GFRNONAA 3 (L) 11/17/2016 0908   GFRAA 4 (L) 03/14/2017 0444   GFRAA <4 (L) 11/17/2016 0908    COAGS: Lab Results  Component Value Date   INR 1.21 03/04/2017   INR 1.1 11/17/2016   INR 0.96 03/13/2012      ASSESSMENT/PLAN: This is a 63 y.o. male with end-stage renal disease previously on dialysis via right arm fistula.  Patient is adamant did not have an upper arm fistula.  We will plan right arm AV grafting versus thrombectomy of current fistula.  I discussed with him that he might require upper arm access and he is unwilling to proceed with this today.  There is a possibility we complete the operation and he has no access of the his existing catheter.  Patient is okay with this as he is on transplant list.  Takira Sherrin C. Donzetta Matters, MD [facility] Office: [phone] Pager: [phone]

## 2019-08-03 ENCOUNTER — Encounter (HOSPITAL_COMMUNITY): Payer: Self-pay | Admitting: Vascular Surgery

## 2019-08-03 DIAGNOSIS — N186 End stage renal disease: Secondary | ICD-10-CM | POA: Diagnosis not present

## 2019-08-03 DIAGNOSIS — E119 Type 2 diabetes mellitus without complications: Secondary | ICD-10-CM | POA: Diagnosis not present

## 2019-08-03 DIAGNOSIS — Z992 Dependence on renal dialysis: Secondary | ICD-10-CM | POA: Diagnosis not present

## 2019-08-03 DIAGNOSIS — Z23 Encounter for immunization: Secondary | ICD-10-CM | POA: Diagnosis not present

## 2019-08-03 DIAGNOSIS — N2581 Secondary hyperparathyroidism of renal origin: Secondary | ICD-10-CM | POA: Diagnosis not present

## 2019-08-06 DIAGNOSIS — Z992 Dependence on renal dialysis: Secondary | ICD-10-CM | POA: Diagnosis not present

## 2019-08-06 DIAGNOSIS — Z23 Encounter for immunization: Secondary | ICD-10-CM | POA: Diagnosis not present

## 2019-08-06 DIAGNOSIS — E119 Type 2 diabetes mellitus without complications: Secondary | ICD-10-CM | POA: Diagnosis not present

## 2019-08-06 DIAGNOSIS — N186 End stage renal disease: Secondary | ICD-10-CM | POA: Diagnosis not present

## 2019-08-06 DIAGNOSIS — N2581 Secondary hyperparathyroidism of renal origin: Secondary | ICD-10-CM | POA: Diagnosis not present

## 2019-08-06 LAB — POCT I-STAT, CHEM 8
BUN: 69 mg/dL — ABNORMAL HIGH (ref 8–23)
Calcium, Ion: 0.98 mmol/L — ABNORMAL LOW (ref 1.15–1.40)
Chloride: 100 mmol/L (ref 98–111)
Creatinine, Ser: 14.4 mg/dL — ABNORMAL HIGH (ref 0.61–1.24)
Glucose, Bld: 101 mg/dL — ABNORMAL HIGH (ref 70–99)
HCT: 51 % (ref 39.0–52.0)
Hemoglobin: 17.3 g/dL — ABNORMAL HIGH (ref 13.0–17.0)
Potassium: 7.8 mmol/L (ref 3.5–5.1)
Sodium: 131 mmol/L — ABNORMAL LOW (ref 135–145)
TCO2: 25 mmol/L (ref 22–32)

## 2019-08-08 DIAGNOSIS — N186 End stage renal disease: Secondary | ICD-10-CM | POA: Diagnosis not present

## 2019-08-08 DIAGNOSIS — N2581 Secondary hyperparathyroidism of renal origin: Secondary | ICD-10-CM | POA: Diagnosis not present

## 2019-08-08 DIAGNOSIS — Z23 Encounter for immunization: Secondary | ICD-10-CM | POA: Diagnosis not present

## 2019-08-08 DIAGNOSIS — Z992 Dependence on renal dialysis: Secondary | ICD-10-CM | POA: Diagnosis not present

## 2019-08-08 DIAGNOSIS — E119 Type 2 diabetes mellitus without complications: Secondary | ICD-10-CM | POA: Diagnosis not present

## 2019-08-10 DIAGNOSIS — Z992 Dependence on renal dialysis: Secondary | ICD-10-CM | POA: Diagnosis not present

## 2019-08-10 DIAGNOSIS — N2581 Secondary hyperparathyroidism of renal origin: Secondary | ICD-10-CM | POA: Diagnosis not present

## 2019-08-10 DIAGNOSIS — Z23 Encounter for immunization: Secondary | ICD-10-CM | POA: Diagnosis not present

## 2019-08-10 DIAGNOSIS — E119 Type 2 diabetes mellitus without complications: Secondary | ICD-10-CM | POA: Diagnosis not present

## 2019-08-10 DIAGNOSIS — N186 End stage renal disease: Secondary | ICD-10-CM | POA: Diagnosis not present

## 2019-08-13 DIAGNOSIS — N2581 Secondary hyperparathyroidism of renal origin: Secondary | ICD-10-CM | POA: Diagnosis not present

## 2019-08-13 DIAGNOSIS — Z23 Encounter for immunization: Secondary | ICD-10-CM | POA: Diagnosis not present

## 2019-08-13 DIAGNOSIS — Z992 Dependence on renal dialysis: Secondary | ICD-10-CM | POA: Diagnosis not present

## 2019-08-13 DIAGNOSIS — E119 Type 2 diabetes mellitus without complications: Secondary | ICD-10-CM | POA: Diagnosis not present

## 2019-08-13 DIAGNOSIS — N186 End stage renal disease: Secondary | ICD-10-CM | POA: Diagnosis not present

## 2019-08-15 DIAGNOSIS — N2581 Secondary hyperparathyroidism of renal origin: Secondary | ICD-10-CM | POA: Diagnosis not present

## 2019-08-15 DIAGNOSIS — Z23 Encounter for immunization: Secondary | ICD-10-CM | POA: Diagnosis not present

## 2019-08-15 DIAGNOSIS — E119 Type 2 diabetes mellitus without complications: Secondary | ICD-10-CM | POA: Diagnosis not present

## 2019-08-15 DIAGNOSIS — Z992 Dependence on renal dialysis: Secondary | ICD-10-CM | POA: Diagnosis not present

## 2019-08-15 DIAGNOSIS — N186 End stage renal disease: Secondary | ICD-10-CM | POA: Diagnosis not present

## 2019-08-16 DIAGNOSIS — E1129 Type 2 diabetes mellitus with other diabetic kidney complication: Secondary | ICD-10-CM | POA: Diagnosis not present

## 2019-08-16 DIAGNOSIS — Z992 Dependence on renal dialysis: Secondary | ICD-10-CM | POA: Diagnosis not present

## 2019-08-16 DIAGNOSIS — N186 End stage renal disease: Secondary | ICD-10-CM | POA: Diagnosis not present

## 2019-08-17 DIAGNOSIS — E119 Type 2 diabetes mellitus without complications: Secondary | ICD-10-CM | POA: Diagnosis not present

## 2019-08-17 DIAGNOSIS — D689 Coagulation defect, unspecified: Secondary | ICD-10-CM | POA: Diagnosis not present

## 2019-08-17 DIAGNOSIS — Z992 Dependence on renal dialysis: Secondary | ICD-10-CM | POA: Diagnosis not present

## 2019-08-17 DIAGNOSIS — N186 End stage renal disease: Secondary | ICD-10-CM | POA: Diagnosis not present

## 2019-08-17 DIAGNOSIS — N2581 Secondary hyperparathyroidism of renal origin: Secondary | ICD-10-CM | POA: Diagnosis not present

## 2019-08-17 DIAGNOSIS — T8249XA Other complication of vascular dialysis catheter, initial encounter: Secondary | ICD-10-CM | POA: Diagnosis not present

## 2019-08-20 DIAGNOSIS — D689 Coagulation defect, unspecified: Secondary | ICD-10-CM | POA: Diagnosis not present

## 2019-08-20 DIAGNOSIS — N186 End stage renal disease: Secondary | ICD-10-CM | POA: Diagnosis not present

## 2019-08-20 DIAGNOSIS — N2581 Secondary hyperparathyroidism of renal origin: Secondary | ICD-10-CM | POA: Diagnosis not present

## 2019-08-20 DIAGNOSIS — E119 Type 2 diabetes mellitus without complications: Secondary | ICD-10-CM | POA: Diagnosis not present

## 2019-08-20 DIAGNOSIS — Z992 Dependence on renal dialysis: Secondary | ICD-10-CM | POA: Diagnosis not present

## 2019-08-22 DIAGNOSIS — E119 Type 2 diabetes mellitus without complications: Secondary | ICD-10-CM | POA: Diagnosis not present

## 2019-08-22 DIAGNOSIS — Z992 Dependence on renal dialysis: Secondary | ICD-10-CM | POA: Diagnosis not present

## 2019-08-22 DIAGNOSIS — N186 End stage renal disease: Secondary | ICD-10-CM | POA: Diagnosis not present

## 2019-08-22 DIAGNOSIS — N2581 Secondary hyperparathyroidism of renal origin: Secondary | ICD-10-CM | POA: Diagnosis not present

## 2019-08-22 DIAGNOSIS — D689 Coagulation defect, unspecified: Secondary | ICD-10-CM | POA: Diagnosis not present

## 2019-08-24 DIAGNOSIS — N186 End stage renal disease: Secondary | ICD-10-CM | POA: Diagnosis not present

## 2019-08-24 DIAGNOSIS — E119 Type 2 diabetes mellitus without complications: Secondary | ICD-10-CM | POA: Diagnosis not present

## 2019-08-24 DIAGNOSIS — D689 Coagulation defect, unspecified: Secondary | ICD-10-CM | POA: Diagnosis not present

## 2019-08-24 DIAGNOSIS — Z992 Dependence on renal dialysis: Secondary | ICD-10-CM | POA: Diagnosis not present

## 2019-08-24 DIAGNOSIS — N2581 Secondary hyperparathyroidism of renal origin: Secondary | ICD-10-CM | POA: Diagnosis not present

## 2019-08-27 DIAGNOSIS — D689 Coagulation defect, unspecified: Secondary | ICD-10-CM | POA: Diagnosis not present

## 2019-08-27 DIAGNOSIS — N2581 Secondary hyperparathyroidism of renal origin: Secondary | ICD-10-CM | POA: Diagnosis not present

## 2019-08-27 DIAGNOSIS — Z992 Dependence on renal dialysis: Secondary | ICD-10-CM | POA: Diagnosis not present

## 2019-08-27 DIAGNOSIS — N186 End stage renal disease: Secondary | ICD-10-CM | POA: Diagnosis not present

## 2019-08-27 DIAGNOSIS — E119 Type 2 diabetes mellitus without complications: Secondary | ICD-10-CM | POA: Diagnosis not present

## 2019-08-28 ENCOUNTER — Encounter: Payer: Self-pay | Admitting: Podiatry

## 2019-08-28 ENCOUNTER — Ambulatory Visit (INDEPENDENT_AMBULATORY_CARE_PROVIDER_SITE_OTHER): Payer: Medicare Other | Admitting: Podiatry

## 2019-08-28 ENCOUNTER — Other Ambulatory Visit: Payer: Self-pay

## 2019-08-28 DIAGNOSIS — B351 Tinea unguium: Secondary | ICD-10-CM

## 2019-08-28 DIAGNOSIS — M14672 Charcot's joint, left ankle and foot: Secondary | ICD-10-CM

## 2019-08-28 DIAGNOSIS — E1142 Type 2 diabetes mellitus with diabetic polyneuropathy: Secondary | ICD-10-CM

## 2019-08-29 DIAGNOSIS — D689 Coagulation defect, unspecified: Secondary | ICD-10-CM | POA: Diagnosis not present

## 2019-08-29 DIAGNOSIS — Z992 Dependence on renal dialysis: Secondary | ICD-10-CM | POA: Diagnosis not present

## 2019-08-29 DIAGNOSIS — E119 Type 2 diabetes mellitus without complications: Secondary | ICD-10-CM | POA: Diagnosis not present

## 2019-08-29 DIAGNOSIS — N186 End stage renal disease: Secondary | ICD-10-CM | POA: Diagnosis not present

## 2019-08-29 DIAGNOSIS — N2581 Secondary hyperparathyroidism of renal origin: Secondary | ICD-10-CM | POA: Diagnosis not present

## 2019-08-31 DIAGNOSIS — Z992 Dependence on renal dialysis: Secondary | ICD-10-CM | POA: Diagnosis not present

## 2019-08-31 DIAGNOSIS — D689 Coagulation defect, unspecified: Secondary | ICD-10-CM | POA: Diagnosis not present

## 2019-08-31 DIAGNOSIS — E119 Type 2 diabetes mellitus without complications: Secondary | ICD-10-CM | POA: Diagnosis not present

## 2019-08-31 DIAGNOSIS — N186 End stage renal disease: Secondary | ICD-10-CM | POA: Diagnosis not present

## 2019-08-31 DIAGNOSIS — N2581 Secondary hyperparathyroidism of renal origin: Secondary | ICD-10-CM | POA: Diagnosis not present

## 2019-09-02 NOTE — Progress Notes (Signed)
Subjective:  [name] presents to clinic for preventative diabetic footcare on today with cc of  painful, thick, discolored, elongated toenails 1-5 b/l that become tender and cannot cut because of thickness. Pain is aggravated when wearing enclosed shoe gear.  [name] has h/o Charcot neuroarthropathy of the left foot/ankle. He has CROW walker and has been followed in the past by [facility]. He relates desire to have our clinic follow him for that as well.  He does have a CROW walker for the left lower extremity.  Current Outpatient Medications on File Prior to Visit  Medication Sig Dispense Refill  . heparin 1000 UNIT/ML injection Heparin Sodium (Porcine) 1,000 Units/mL Systemic    . acetaminophen (TYLENOL) 325 MG tablet Take 650 mg by mouth every 6 (six) hours as needed for mild pain, moderate pain, fever or headache.     . allopurinol (ZYLOPRIM) 100 MG tablet Take 1 tablet (100 mg total) by mouth daily. (Patient taking differently: Take 100 mg by mouth 2 (two) times daily. ) 30 tablet 2  . amLODipine (NORVASC) 5 MG tablet Take 5 mg by mouth daily.    [name] 1 GM 210 MG(Fe) tablet Take 420 mg by mouth 2 (two) times daily with a meal.   12  . carvedilol (COREG) 6.25 MG tablet Take 6.25 mg by mouth 2 (two) times daily with a meal.    . cinacalcet (SENSIPAR) 90 MG tablet Take 90 mg by mouth every Monday, Wednesday, and Friday.     . clotrimazole (LOTRIMIN) 1 % cream Apply to both feet and between toes twice daily (Patient not taking: Reported on 07/30/2019) 30 g 2  . COLCRYS 0.6 MG tablet Take 0.6 mg by mouth daily as needed (gout).     Marland Kitchen diphenhydrAMINE (BENADRYL) 25 MG tablet Take 25 mg by mouth every Monday, Wednesday, and Friday.    . furosemide (LASIX) 40 MG tablet Take 40 mg by mouth daily.    Marland Kitchen gabapentin (NEURONTIN) 100 MG capsule Take 100 mg by mouth 2 (two) times daily.     Marland Kitchen HYDROcodone-acetaminophen (NORCO) 5-325 MG tablet Take 1 tablet by mouth every 6 (six) hours as  needed for moderate pain. 12 tablet 0  . Insulin Detemir (LEVEMIR FLEXTOUCH) 100 UNIT/ML Pen Inject 20 Units into the skin at bedtime.     Marland Kitchen omeprazole (PRILOSEC) 20 MG capsule Take by mouth daily.    . pravastatin (PRAVACHOL) 20 MG tablet Take 20 mg by mouth daily.      No current facility-administered medications on file prior to visit.      Allergies  Allergen Reactions  . Penicillins Nausea And Vomiting and Other (See Comments)    Has patient had a PCN reaction causing immediate rash, facial/tongue/throat swelling, SOB or lightheadedness with hypotension: No Has patient had a PCN reaction causing severe rash involving mucus membranes or skin necrosis: No Has patient had a PCN reaction that required hospitalization No Has patient had a PCN reaction occurring within the last 10 years: No If all of the above answers are "NO", then may proceed with Cephalosporin use.  Marland Kitchen Propolis Nausea And Vomiting  . Vioxx [Rofecoxib] Nausea And Vomiting     Objective: There were no vitals filed for this visit.  Physical Examination:  Vascular Examination: Capillary refill time delayed x 10 digits.  Palpable DP/PT pulses b/l.  Digital hair absent b/l.  No edema noted b/l.  Skin temperature gradient WNL b/l.  Dermatological Examination: Skin with normal turgor,  texture and tone b/l.  Ace bandage left LE clean, dry and intact.   No open wounds b/l.  No interdigital macerations noted b/l.  Elongated, thick, discolored brittle toenails with subungual debris and pain on dorsal palpation of nailbeds 1-5 b/l.  Musculoskeletal Examination: Muscle strength 5/5 to all muscle groups b/l.  Charcot deformity left ankle/foot  No pain, crepitus or joint discomfort with active/passive ROM.  Neurological Examination: Sensation diminihsed b/l with 10 gram monofilament.  Assessment: Mycotic nail infection with pain 1-5 b/l NIDDM with neuropathy Charcot neuroarthropathy left  ankle/foot  Plan: 1. Toenails 1-5 b/l were debrided in length and girth without iatrogenic laceration. 2.  Will refer him to Dr. Amalia Hailey for evaluation of left ankle Charcot. I have asked him to bring CROW walker to the appointment so Dr. Amalia Hailey can evaluate his device. I have also asked [name] to request medical records from [facility] in order for Dr. Amalia Hailey to review his history. 3.  Continue CROW walker left LE. 4.  Report any pedal injuries to medical professional. 5.  Follow up 3 months for his routine foot care with me. 6.  Patient/POA to call should there be a question/concern in there interim.

## 2019-09-03 DIAGNOSIS — E119 Type 2 diabetes mellitus without complications: Secondary | ICD-10-CM | POA: Diagnosis not present

## 2019-09-03 DIAGNOSIS — N2581 Secondary hyperparathyroidism of renal origin: Secondary | ICD-10-CM | POA: Diagnosis not present

## 2019-09-03 DIAGNOSIS — D689 Coagulation defect, unspecified: Secondary | ICD-10-CM | POA: Diagnosis not present

## 2019-09-03 DIAGNOSIS — N186 End stage renal disease: Secondary | ICD-10-CM | POA: Diagnosis not present

## 2019-09-03 DIAGNOSIS — Z992 Dependence on renal dialysis: Secondary | ICD-10-CM | POA: Diagnosis not present

## 2019-09-05 DIAGNOSIS — N186 End stage renal disease: Secondary | ICD-10-CM | POA: Diagnosis not present

## 2019-09-05 DIAGNOSIS — D689 Coagulation defect, unspecified: Secondary | ICD-10-CM | POA: Diagnosis not present

## 2019-09-05 DIAGNOSIS — E119 Type 2 diabetes mellitus without complications: Secondary | ICD-10-CM | POA: Diagnosis not present

## 2019-09-05 DIAGNOSIS — Z992 Dependence on renal dialysis: Secondary | ICD-10-CM | POA: Diagnosis not present

## 2019-09-05 DIAGNOSIS — N2581 Secondary hyperparathyroidism of renal origin: Secondary | ICD-10-CM | POA: Diagnosis not present

## 2019-09-07 DIAGNOSIS — E119 Type 2 diabetes mellitus without complications: Secondary | ICD-10-CM | POA: Diagnosis not present

## 2019-09-07 DIAGNOSIS — N2581 Secondary hyperparathyroidism of renal origin: Secondary | ICD-10-CM | POA: Diagnosis not present

## 2019-09-07 DIAGNOSIS — N186 End stage renal disease: Secondary | ICD-10-CM | POA: Diagnosis not present

## 2019-09-07 DIAGNOSIS — Z992 Dependence on renal dialysis: Secondary | ICD-10-CM | POA: Diagnosis not present

## 2019-09-07 DIAGNOSIS — D689 Coagulation defect, unspecified: Secondary | ICD-10-CM | POA: Diagnosis not present

## 2019-09-10 DIAGNOSIS — N2581 Secondary hyperparathyroidism of renal origin: Secondary | ICD-10-CM | POA: Diagnosis not present

## 2019-09-10 DIAGNOSIS — N186 End stage renal disease: Secondary | ICD-10-CM | POA: Diagnosis not present

## 2019-09-10 DIAGNOSIS — E119 Type 2 diabetes mellitus without complications: Secondary | ICD-10-CM | POA: Diagnosis not present

## 2019-09-10 DIAGNOSIS — Z992 Dependence on renal dialysis: Secondary | ICD-10-CM | POA: Diagnosis not present

## 2019-09-10 DIAGNOSIS — D689 Coagulation defect, unspecified: Secondary | ICD-10-CM | POA: Diagnosis not present

## 2019-09-11 ENCOUNTER — Other Ambulatory Visit (HOSPITAL_COMMUNITY): Payer: Self-pay | Admitting: Nephrology

## 2019-09-11 ENCOUNTER — Other Ambulatory Visit (HOSPITAL_COMMUNITY): Payer: Self-pay | Admitting: Family Medicine

## 2019-09-11 DIAGNOSIS — Z992 Dependence on renal dialysis: Secondary | ICD-10-CM

## 2019-09-11 DIAGNOSIS — N186 End stage renal disease: Secondary | ICD-10-CM

## 2019-09-12 ENCOUNTER — Ambulatory Visit: Payer: Medicare Other | Admitting: Podiatry

## 2019-09-12 DIAGNOSIS — N186 End stage renal disease: Secondary | ICD-10-CM | POA: Diagnosis not present

## 2019-09-12 DIAGNOSIS — E119 Type 2 diabetes mellitus without complications: Secondary | ICD-10-CM | POA: Diagnosis not present

## 2019-09-12 DIAGNOSIS — D689 Coagulation defect, unspecified: Secondary | ICD-10-CM | POA: Diagnosis not present

## 2019-09-12 DIAGNOSIS — Z992 Dependence on renal dialysis: Secondary | ICD-10-CM | POA: Diagnosis not present

## 2019-09-12 DIAGNOSIS — N2581 Secondary hyperparathyroidism of renal origin: Secondary | ICD-10-CM | POA: Diagnosis not present

## 2019-09-14 DIAGNOSIS — Z992 Dependence on renal dialysis: Secondary | ICD-10-CM | POA: Diagnosis not present

## 2019-09-14 DIAGNOSIS — N186 End stage renal disease: Secondary | ICD-10-CM | POA: Diagnosis not present

## 2019-09-14 DIAGNOSIS — N2581 Secondary hyperparathyroidism of renal origin: Secondary | ICD-10-CM | POA: Diagnosis not present

## 2019-09-14 DIAGNOSIS — E119 Type 2 diabetes mellitus without complications: Secondary | ICD-10-CM | POA: Diagnosis not present

## 2019-09-14 DIAGNOSIS — D689 Coagulation defect, unspecified: Secondary | ICD-10-CM | POA: Diagnosis not present

## 2019-09-16 DIAGNOSIS — Z992 Dependence on renal dialysis: Secondary | ICD-10-CM | POA: Diagnosis not present

## 2019-09-16 DIAGNOSIS — N186 End stage renal disease: Secondary | ICD-10-CM | POA: Diagnosis not present

## 2019-09-16 DIAGNOSIS — E1129 Type 2 diabetes mellitus with other diabetic kidney complication: Secondary | ICD-10-CM | POA: Diagnosis not present

## 2019-09-16 HISTORY — PX: OTHER SURGICAL HISTORY: SHX169

## 2019-09-17 DIAGNOSIS — Z992 Dependence on renal dialysis: Secondary | ICD-10-CM | POA: Diagnosis not present

## 2019-09-17 DIAGNOSIS — N186 End stage renal disease: Secondary | ICD-10-CM | POA: Diagnosis not present

## 2019-09-17 DIAGNOSIS — E119 Type 2 diabetes mellitus without complications: Secondary | ICD-10-CM | POA: Diagnosis not present

## 2019-09-17 DIAGNOSIS — N2581 Secondary hyperparathyroidism of renal origin: Secondary | ICD-10-CM | POA: Diagnosis not present

## 2019-09-19 ENCOUNTER — Ambulatory Visit (INDEPENDENT_AMBULATORY_CARE_PROVIDER_SITE_OTHER): Payer: Medicare Other | Admitting: Podiatry

## 2019-09-19 ENCOUNTER — Ambulatory Visit (INDEPENDENT_AMBULATORY_CARE_PROVIDER_SITE_OTHER): Payer: Medicare Other

## 2019-09-19 ENCOUNTER — Other Ambulatory Visit: Payer: Self-pay

## 2019-09-19 DIAGNOSIS — Z992 Dependence on renal dialysis: Secondary | ICD-10-CM | POA: Diagnosis not present

## 2019-09-19 DIAGNOSIS — M14672 Charcot's joint, left ankle and foot: Secondary | ICD-10-CM

## 2019-09-19 DIAGNOSIS — E119 Type 2 diabetes mellitus without complications: Secondary | ICD-10-CM | POA: Diagnosis not present

## 2019-09-19 DIAGNOSIS — R600 Localized edema: Secondary | ICD-10-CM | POA: Diagnosis not present

## 2019-09-19 DIAGNOSIS — N186 End stage renal disease: Secondary | ICD-10-CM | POA: Diagnosis not present

## 2019-09-19 DIAGNOSIS — M79672 Pain in left foot: Secondary | ICD-10-CM

## 2019-09-19 DIAGNOSIS — N2581 Secondary hyperparathyroidism of renal origin: Secondary | ICD-10-CM | POA: Diagnosis not present

## 2019-09-19 NOTE — Progress Notes (Signed)
   HPI: 63 y.o. male presenting today as referral from Dr. Adah Perl for evaluation of Charcot deformity to the left foot.  Patient has noticed a significant amount of swelling to the left foot and ankle over the past several months.  He was seen here in the office on 08/28/2019 and was referred to to my care for management of the Charcot deformity.  He is currently ambulating in immobilization cam boot.  No new complaints at this time  Past Medical History:  Diagnosis Date  . Alcohol abuse   . Congestive heart disease ([place]) 12/2011  . Diabetes mellitus   . GERD (gastroesophageal reflux disease)   . Gout   . Headache   . Hepatitis    being treated for Hepatitis C  . HOH (hard of hearing)   . Hyperlipidemia   . Hyperlipidemia   . Hypertension   . Monoclonal gammopathies 02/25/2012  . Obesity   . Pedal edema   . Pneumonia   . Renal insufficiency   . Sleep apnea    uses cpap  . Tobacco abuse   . Wears hearing aid    b/l     Physical Exam: General: The patient is alert and oriented x3 in no acute distress.  Dermatology: Skin is warm, dry and supple bilateral lower extremities. Negative for open lesions or macerations.  Vascular: Palpable pedal pulses bilaterally.  Heavy edema noted diffusely to the right foot and ankle. Capillary refill within normal limits.  Neurological: Epicritic and protective threshold absent bilaterally.   Musculoskeletal Exam: Limited range of motion to the pedal and ankle joints of the foot.  Muscle strength 5/5 in all groups bilateral.  Negative for any significant pain on palpation  Radiographic Exam:  Osseous erosion and joint destruction noted through the tarsal joints and midfoot with medial longitudinal arch collapse.  Almost complete erosion of the talar neck noted.  Findings consistent with Charcot neuroarthropathy  Assessment: 1.  Charcot neuroarthropathy left foot 2.  Edema left foot and ankle   Plan of Care:  1. Patient evaluated. X-Rays  reviewed.  2.  Multilayer Unna boot compression wrap applied to the left lower extremity 3.  Continue nonweightbearing in the immobilization cam boot.  I explained that he needs to be strictly nonweightbearing to the lower extremity and allow for the bones to heal in the Charcot to stabilize 4.  Return to clinic in 2 weeks to reevaluate the edema   Edrick Kins, DPM [facility]  Dr. Edrick Kins, DPM    [address], [place] [address]                Office [phone]  Fax [phone]

## 2019-09-20 ENCOUNTER — Encounter (HOSPITAL_COMMUNITY): Payer: Self-pay

## 2019-09-20 ENCOUNTER — Ambulatory Visit (HOSPITAL_COMMUNITY): Admission: RE | Admit: 2019-09-20 | Payer: Medicare Other | Source: Ambulatory Visit

## 2019-09-20 DIAGNOSIS — N186 End stage renal disease: Secondary | ICD-10-CM | POA: Diagnosis not present

## 2019-09-20 DIAGNOSIS — Z992 Dependence on renal dialysis: Secondary | ICD-10-CM | POA: Diagnosis not present

## 2019-09-20 DIAGNOSIS — I871 Compression of vein: Secondary | ICD-10-CM | POA: Diagnosis not present

## 2019-09-20 DIAGNOSIS — T82858A Stenosis of vascular prosthetic devices, implants and grafts, initial encounter: Secondary | ICD-10-CM | POA: Diagnosis not present

## 2019-09-21 ENCOUNTER — Other Ambulatory Visit: Payer: Self-pay | Admitting: Podiatry

## 2019-09-21 DIAGNOSIS — M14672 Charcot's joint, left ankle and foot: Secondary | ICD-10-CM

## 2019-09-21 DIAGNOSIS — N186 End stage renal disease: Secondary | ICD-10-CM | POA: Diagnosis not present

## 2019-09-21 DIAGNOSIS — E119 Type 2 diabetes mellitus without complications: Secondary | ICD-10-CM | POA: Diagnosis not present

## 2019-09-21 DIAGNOSIS — Z992 Dependence on renal dialysis: Secondary | ICD-10-CM | POA: Diagnosis not present

## 2019-09-21 DIAGNOSIS — N2581 Secondary hyperparathyroidism of renal origin: Secondary | ICD-10-CM | POA: Diagnosis not present

## 2019-09-24 DIAGNOSIS — E119 Type 2 diabetes mellitus without complications: Secondary | ICD-10-CM | POA: Diagnosis not present

## 2019-09-24 DIAGNOSIS — Z992 Dependence on renal dialysis: Secondary | ICD-10-CM | POA: Diagnosis not present

## 2019-09-24 DIAGNOSIS — N2581 Secondary hyperparathyroidism of renal origin: Secondary | ICD-10-CM | POA: Diagnosis not present

## 2019-09-24 DIAGNOSIS — N186 End stage renal disease: Secondary | ICD-10-CM | POA: Diagnosis not present

## 2019-09-26 DIAGNOSIS — E119 Type 2 diabetes mellitus without complications: Secondary | ICD-10-CM | POA: Diagnosis not present

## 2019-09-26 DIAGNOSIS — N2581 Secondary hyperparathyroidism of renal origin: Secondary | ICD-10-CM | POA: Diagnosis not present

## 2019-09-26 DIAGNOSIS — Z992 Dependence on renal dialysis: Secondary | ICD-10-CM | POA: Diagnosis not present

## 2019-09-26 DIAGNOSIS — N186 End stage renal disease: Secondary | ICD-10-CM | POA: Diagnosis not present

## 2019-09-27 DIAGNOSIS — I10 Essential (primary) hypertension: Secondary | ICD-10-CM | POA: Diagnosis not present

## 2019-09-27 DIAGNOSIS — N186 End stage renal disease: Secondary | ICD-10-CM | POA: Diagnosis not present

## 2019-09-27 DIAGNOSIS — E1122 Type 2 diabetes mellitus with diabetic chronic kidney disease: Secondary | ICD-10-CM | POA: Diagnosis not present

## 2019-09-27 DIAGNOSIS — Z0000 Encounter for general adult medical examination without abnormal findings: Secondary | ICD-10-CM | POA: Diagnosis not present

## 2019-09-27 DIAGNOSIS — E0842 Diabetes mellitus due to underlying condition with diabetic polyneuropathy: Secondary | ICD-10-CM | POA: Diagnosis not present

## 2019-09-27 DIAGNOSIS — D472 Monoclonal gammopathy: Secondary | ICD-10-CM | POA: Diagnosis not present

## 2019-09-27 DIAGNOSIS — Z125 Encounter for screening for malignant neoplasm of prostate: Secondary | ICD-10-CM | POA: Diagnosis not present

## 2019-09-27 DIAGNOSIS — M109 Gout, unspecified: Secondary | ICD-10-CM | POA: Diagnosis not present

## 2019-09-27 DIAGNOSIS — I509 Heart failure, unspecified: Secondary | ICD-10-CM | POA: Diagnosis not present

## 2019-09-27 DIAGNOSIS — E782 Mixed hyperlipidemia: Secondary | ICD-10-CM | POA: Diagnosis not present

## 2019-09-27 DIAGNOSIS — G4730 Sleep apnea, unspecified: Secondary | ICD-10-CM | POA: Diagnosis not present

## 2019-09-28 DIAGNOSIS — N186 End stage renal disease: Secondary | ICD-10-CM | POA: Diagnosis not present

## 2019-09-28 DIAGNOSIS — E119 Type 2 diabetes mellitus without complications: Secondary | ICD-10-CM | POA: Diagnosis not present

## 2019-09-28 DIAGNOSIS — Z992 Dependence on renal dialysis: Secondary | ICD-10-CM | POA: Diagnosis not present

## 2019-09-28 DIAGNOSIS — N2581 Secondary hyperparathyroidism of renal origin: Secondary | ICD-10-CM | POA: Diagnosis not present

## 2019-10-01 DIAGNOSIS — Z992 Dependence on renal dialysis: Secondary | ICD-10-CM | POA: Diagnosis not present

## 2019-10-01 DIAGNOSIS — E119 Type 2 diabetes mellitus without complications: Secondary | ICD-10-CM | POA: Diagnosis not present

## 2019-10-01 DIAGNOSIS — N2581 Secondary hyperparathyroidism of renal origin: Secondary | ICD-10-CM | POA: Diagnosis not present

## 2019-10-01 DIAGNOSIS — N186 End stage renal disease: Secondary | ICD-10-CM | POA: Diagnosis not present

## 2019-10-03 ENCOUNTER — Ambulatory Visit (INDEPENDENT_AMBULATORY_CARE_PROVIDER_SITE_OTHER): Payer: Medicare Other | Admitting: Podiatry

## 2019-10-03 ENCOUNTER — Other Ambulatory Visit: Payer: Self-pay

## 2019-10-03 DIAGNOSIS — R600 Localized edema: Secondary | ICD-10-CM | POA: Diagnosis not present

## 2019-10-03 DIAGNOSIS — M14672 Charcot's joint, left ankle and foot: Secondary | ICD-10-CM

## 2019-10-03 DIAGNOSIS — N186 End stage renal disease: Secondary | ICD-10-CM | POA: Diagnosis not present

## 2019-10-03 DIAGNOSIS — E119 Type 2 diabetes mellitus without complications: Secondary | ICD-10-CM | POA: Diagnosis not present

## 2019-10-03 DIAGNOSIS — N2581 Secondary hyperparathyroidism of renal origin: Secondary | ICD-10-CM | POA: Diagnosis not present

## 2019-10-03 DIAGNOSIS — Z992 Dependence on renal dialysis: Secondary | ICD-10-CM | POA: Diagnosis not present

## 2019-10-05 DIAGNOSIS — E119 Type 2 diabetes mellitus without complications: Secondary | ICD-10-CM | POA: Diagnosis not present

## 2019-10-05 DIAGNOSIS — N186 End stage renal disease: Secondary | ICD-10-CM | POA: Diagnosis not present

## 2019-10-05 DIAGNOSIS — Z992 Dependence on renal dialysis: Secondary | ICD-10-CM | POA: Diagnosis not present

## 2019-10-05 DIAGNOSIS — N2581 Secondary hyperparathyroidism of renal origin: Secondary | ICD-10-CM | POA: Diagnosis not present

## 2019-10-07 DIAGNOSIS — N186 End stage renal disease: Secondary | ICD-10-CM | POA: Diagnosis not present

## 2019-10-07 DIAGNOSIS — Z992 Dependence on renal dialysis: Secondary | ICD-10-CM | POA: Diagnosis not present

## 2019-10-07 DIAGNOSIS — E119 Type 2 diabetes mellitus without complications: Secondary | ICD-10-CM | POA: Diagnosis not present

## 2019-10-07 DIAGNOSIS — N2581 Secondary hyperparathyroidism of renal origin: Secondary | ICD-10-CM | POA: Diagnosis not present

## 2019-10-09 DIAGNOSIS — E119 Type 2 diabetes mellitus without complications: Secondary | ICD-10-CM | POA: Diagnosis not present

## 2019-10-09 DIAGNOSIS — N186 End stage renal disease: Secondary | ICD-10-CM | POA: Diagnosis not present

## 2019-10-09 DIAGNOSIS — N2581 Secondary hyperparathyroidism of renal origin: Secondary | ICD-10-CM | POA: Diagnosis not present

## 2019-10-09 DIAGNOSIS — Z992 Dependence on renal dialysis: Secondary | ICD-10-CM | POA: Diagnosis not present

## 2019-10-10 DIAGNOSIS — Z452 Encounter for adjustment and management of vascular access device: Secondary | ICD-10-CM | POA: Diagnosis not present

## 2019-10-12 DIAGNOSIS — Z992 Dependence on renal dialysis: Secondary | ICD-10-CM | POA: Diagnosis not present

## 2019-10-12 DIAGNOSIS — E119 Type 2 diabetes mellitus without complications: Secondary | ICD-10-CM | POA: Diagnosis not present

## 2019-10-12 DIAGNOSIS — N186 End stage renal disease: Secondary | ICD-10-CM | POA: Diagnosis not present

## 2019-10-12 DIAGNOSIS — N2581 Secondary hyperparathyroidism of renal origin: Secondary | ICD-10-CM | POA: Diagnosis not present

## 2019-10-14 NOTE — Progress Notes (Signed)
   HPI: 63 y.o. male presenting today for follow up evaluation of Charcot neuropathy and foot and ankle edema of the left foot. He states the unna boot helped while he was wearing it but as soon as it was removed the swelling returned. He denies any other modifying factors or complaints at this time. Patient is here for further evaluation and treatment.   Past Medical History:  Diagnosis Date  . Alcohol abuse   . Congestive heart disease ([place]) 12/2011  . Diabetes mellitus   . GERD (gastroesophageal reflux disease)   . Gout   . Headache   . Hepatitis    being treated for Hepatitis C  . HOH (hard of hearing)   . Hyperlipidemia   . Hyperlipidemia   . Hypertension   . Monoclonal gammopathies 02/25/2012  . Obesity   . Pedal edema   . Pneumonia   . Renal insufficiency   . Sleep apnea    uses cpap  . Tobacco abuse   . Wears hearing aid    b/l     Physical Exam: General: The patient is alert and oriented x3 in no acute distress.  Dermatology: Skin is warm, dry and supple bilateral lower extremities. Negative for open lesions or macerations.  Vascular: Palpable pedal pulses bilaterally.  Heavy edema noted diffusely to the right foot and ankle. Capillary refill within normal limits.  Neurological: Epicritic and protective threshold absent bilaterally.   Musculoskeletal Exam: Limited range of motion to the pedal and ankle joints of the foot.  Muscle strength 5/5 in all groups bilateral.  Negative for any significant pain on palpation  Assessment: 1.  Charcot neuroarthropathy left foot 2.  Edema left foot and ankle   Plan of Care:  1. Patient evaluated. 2. Multilayer Unna boot compression wrap applied to the left lower extremity 3. Patient may bear weight in his CROW boot.  4. Return to clinic in 3 weeks.   Having kidney surgery on 10/29/2019.    Edrick Kins, DPM [facility]  Dr. Edrick Kins, DPM    [address], [place] [address]                Office [phone]  Fax [phone]

## 2019-10-15 DIAGNOSIS — N186 End stage renal disease: Secondary | ICD-10-CM | POA: Diagnosis not present

## 2019-10-15 DIAGNOSIS — E119 Type 2 diabetes mellitus without complications: Secondary | ICD-10-CM | POA: Diagnosis not present

## 2019-10-15 DIAGNOSIS — N2581 Secondary hyperparathyroidism of renal origin: Secondary | ICD-10-CM | POA: Diagnosis not present

## 2019-10-15 DIAGNOSIS — Z992 Dependence on renal dialysis: Secondary | ICD-10-CM | POA: Diagnosis not present

## 2019-10-23 DIAGNOSIS — Z992 Dependence on renal dialysis: Secondary | ICD-10-CM | POA: Diagnosis not present

## 2019-10-23 DIAGNOSIS — N186 End stage renal disease: Secondary | ICD-10-CM | POA: Diagnosis not present

## 2019-10-23 DIAGNOSIS — I871 Compression of vein: Secondary | ICD-10-CM | POA: Diagnosis not present

## 2019-10-23 DIAGNOSIS — T82858A Stenosis of vascular prosthetic devices, implants and grafts, initial encounter: Secondary | ICD-10-CM | POA: Diagnosis not present

## 2019-10-24 ENCOUNTER — Ambulatory Visit (INDEPENDENT_AMBULATORY_CARE_PROVIDER_SITE_OTHER): Payer: Medicare Other | Admitting: Podiatry

## 2019-10-24 ENCOUNTER — Encounter: Payer: Self-pay | Admitting: Podiatry

## 2019-10-24 ENCOUNTER — Other Ambulatory Visit: Payer: Self-pay

## 2019-10-24 DIAGNOSIS — R600 Localized edema: Secondary | ICD-10-CM

## 2019-10-24 DIAGNOSIS — M14672 Charcot's joint, left ankle and foot: Secondary | ICD-10-CM

## 2019-10-28 NOTE — Progress Notes (Signed)
   HPI: 63 y.o. male presenting today for follow up evaluation of Charcot neuropathy and foot and ankle edema of the left foot. He states he is improving. He has had the unna boot and CROW boot in place as directed. He denies any aggravating factors. Patient is here for further evaluation and treatment.   Past Medical History:  Diagnosis Date  . Alcohol abuse   . Congestive heart disease ([place]) 12/2011  . Diabetes mellitus   . GERD (gastroesophageal reflux disease)   . Gout   . Headache   . Hepatitis    being treated for Hepatitis C  . HOH (hard of hearing)   . Hyperlipidemia   . Hyperlipidemia   . Hypertension   . Monoclonal gammopathies 02/25/2012  . Obesity   . Pedal edema   . Pneumonia   . Renal insufficiency   . Sleep apnea    uses cpap  . Tobacco abuse   . Wears hearing aid    b/l     Physical Exam: General: The patient is alert and oriented x3 in no acute distress.  Dermatology: Skin is warm, dry and supple bilateral lower extremities. Negative for open lesions or macerations.  Vascular: Palpable pedal pulses bilaterally.  Heavy edema noted diffusely to the right foot and ankle. Capillary refill within normal limits.  Neurological: Epicritic and protective threshold absent bilaterally.   Musculoskeletal Exam: Limited range of motion to the pedal and ankle joints of the foot.  Muscle strength 5/5 in all groups bilateral.  Negative for any significant pain on palpation  Assessment: 1.  Charcot neuroarthropathy left foot 2.  Edema left foot and ankle - improved    Plan of Care:  1. Patient evaluated. 2. Compression anklet dispensed.  3. Continue CROW boot.  4. Return to clinic in 3 months for routine care.    Having kidney surgery on 10/29/2019.    Edrick Kins, DPM [facility]  Dr. Edrick Kins, DPM    [address],  [address]                Office [phone]  Fax [phone]

## 2019-11-01 DIAGNOSIS — T8241XD Breakdown (mechanical) of vascular dialysis catheter, subsequent encounter: Secondary | ICD-10-CM | POA: Diagnosis not present

## 2019-11-16 DIAGNOSIS — E1129 Type 2 diabetes mellitus with other diabetic kidney complication: Secondary | ICD-10-CM | POA: Diagnosis not present

## 2019-11-16 DIAGNOSIS — N186 End stage renal disease: Secondary | ICD-10-CM | POA: Diagnosis not present

## 2019-11-16 DIAGNOSIS — Z992 Dependence on renal dialysis: Secondary | ICD-10-CM | POA: Diagnosis not present

## 2019-11-17 DIAGNOSIS — N186 End stage renal disease: Secondary | ICD-10-CM | POA: Diagnosis not present

## 2019-11-17 DIAGNOSIS — D631 Anemia in chronic kidney disease: Secondary | ICD-10-CM | POA: Diagnosis not present

## 2019-11-17 DIAGNOSIS — Z992 Dependence on renal dialysis: Secondary | ICD-10-CM | POA: Diagnosis not present

## 2019-11-17 DIAGNOSIS — D509 Iron deficiency anemia, unspecified: Secondary | ICD-10-CM | POA: Diagnosis not present

## 2019-11-17 DIAGNOSIS — N2581 Secondary hyperparathyroidism of renal origin: Secondary | ICD-10-CM | POA: Diagnosis not present

## 2019-11-17 DIAGNOSIS — D689 Coagulation defect, unspecified: Secondary | ICD-10-CM | POA: Diagnosis not present

## 2019-11-19 DIAGNOSIS — Z992 Dependence on renal dialysis: Secondary | ICD-10-CM | POA: Diagnosis not present

## 2019-11-19 DIAGNOSIS — N2581 Secondary hyperparathyroidism of renal origin: Secondary | ICD-10-CM | POA: Diagnosis not present

## 2019-11-19 DIAGNOSIS — D509 Iron deficiency anemia, unspecified: Secondary | ICD-10-CM | POA: Diagnosis not present

## 2019-11-19 DIAGNOSIS — D689 Coagulation defect, unspecified: Secondary | ICD-10-CM | POA: Diagnosis not present

## 2019-11-19 DIAGNOSIS — N186 End stage renal disease: Secondary | ICD-10-CM | POA: Diagnosis not present

## 2019-11-19 DIAGNOSIS — D631 Anemia in chronic kidney disease: Secondary | ICD-10-CM | POA: Diagnosis not present

## 2019-11-21 DIAGNOSIS — D689 Coagulation defect, unspecified: Secondary | ICD-10-CM | POA: Diagnosis not present

## 2019-11-21 DIAGNOSIS — N2581 Secondary hyperparathyroidism of renal origin: Secondary | ICD-10-CM | POA: Diagnosis not present

## 2019-11-21 DIAGNOSIS — D509 Iron deficiency anemia, unspecified: Secondary | ICD-10-CM | POA: Diagnosis not present

## 2019-11-21 DIAGNOSIS — D631 Anemia in chronic kidney disease: Secondary | ICD-10-CM | POA: Diagnosis not present

## 2019-11-21 DIAGNOSIS — N186 End stage renal disease: Secondary | ICD-10-CM | POA: Diagnosis not present

## 2019-11-21 DIAGNOSIS — Z992 Dependence on renal dialysis: Secondary | ICD-10-CM | POA: Diagnosis not present

## 2019-11-23 DIAGNOSIS — D509 Iron deficiency anemia, unspecified: Secondary | ICD-10-CM | POA: Diagnosis not present

## 2019-11-23 DIAGNOSIS — N186 End stage renal disease: Secondary | ICD-10-CM | POA: Diagnosis not present

## 2019-11-23 DIAGNOSIS — Z992 Dependence on renal dialysis: Secondary | ICD-10-CM | POA: Diagnosis not present

## 2019-11-23 DIAGNOSIS — N2581 Secondary hyperparathyroidism of renal origin: Secondary | ICD-10-CM | POA: Diagnosis not present

## 2019-11-23 DIAGNOSIS — D631 Anemia in chronic kidney disease: Secondary | ICD-10-CM | POA: Diagnosis not present

## 2019-11-23 DIAGNOSIS — D689 Coagulation defect, unspecified: Secondary | ICD-10-CM | POA: Diagnosis not present

## 2019-11-26 DIAGNOSIS — N2581 Secondary hyperparathyroidism of renal origin: Secondary | ICD-10-CM | POA: Diagnosis not present

## 2019-11-26 DIAGNOSIS — D509 Iron deficiency anemia, unspecified: Secondary | ICD-10-CM | POA: Diagnosis not present

## 2019-11-26 DIAGNOSIS — N186 End stage renal disease: Secondary | ICD-10-CM | POA: Diagnosis not present

## 2019-11-26 DIAGNOSIS — D689 Coagulation defect, unspecified: Secondary | ICD-10-CM | POA: Diagnosis not present

## 2019-11-26 DIAGNOSIS — Z992 Dependence on renal dialysis: Secondary | ICD-10-CM | POA: Diagnosis not present

## 2019-11-26 DIAGNOSIS — D631 Anemia in chronic kidney disease: Secondary | ICD-10-CM | POA: Diagnosis not present

## 2019-11-28 DIAGNOSIS — D509 Iron deficiency anemia, unspecified: Secondary | ICD-10-CM | POA: Diagnosis not present

## 2019-11-28 DIAGNOSIS — D689 Coagulation defect, unspecified: Secondary | ICD-10-CM | POA: Diagnosis not present

## 2019-11-28 DIAGNOSIS — Z992 Dependence on renal dialysis: Secondary | ICD-10-CM | POA: Diagnosis not present

## 2019-11-28 DIAGNOSIS — N2581 Secondary hyperparathyroidism of renal origin: Secondary | ICD-10-CM | POA: Diagnosis not present

## 2019-11-28 DIAGNOSIS — N186 End stage renal disease: Secondary | ICD-10-CM | POA: Diagnosis not present

## 2019-11-28 DIAGNOSIS — D631 Anemia in chronic kidney disease: Secondary | ICD-10-CM | POA: Diagnosis not present

## 2019-11-30 DIAGNOSIS — D509 Iron deficiency anemia, unspecified: Secondary | ICD-10-CM | POA: Diagnosis not present

## 2019-11-30 DIAGNOSIS — N2581 Secondary hyperparathyroidism of renal origin: Secondary | ICD-10-CM | POA: Diagnosis not present

## 2019-11-30 DIAGNOSIS — N186 End stage renal disease: Secondary | ICD-10-CM | POA: Diagnosis not present

## 2019-11-30 DIAGNOSIS — D689 Coagulation defect, unspecified: Secondary | ICD-10-CM | POA: Diagnosis not present

## 2019-11-30 DIAGNOSIS — D631 Anemia in chronic kidney disease: Secondary | ICD-10-CM | POA: Diagnosis not present

## 2019-11-30 DIAGNOSIS — Z992 Dependence on renal dialysis: Secondary | ICD-10-CM | POA: Diagnosis not present

## 2019-12-03 DIAGNOSIS — D689 Coagulation defect, unspecified: Secondary | ICD-10-CM | POA: Diagnosis not present

## 2019-12-03 DIAGNOSIS — Z992 Dependence on renal dialysis: Secondary | ICD-10-CM | POA: Diagnosis not present

## 2019-12-03 DIAGNOSIS — N186 End stage renal disease: Secondary | ICD-10-CM | POA: Diagnosis not present

## 2019-12-03 DIAGNOSIS — D631 Anemia in chronic kidney disease: Secondary | ICD-10-CM | POA: Diagnosis not present

## 2019-12-03 DIAGNOSIS — D509 Iron deficiency anemia, unspecified: Secondary | ICD-10-CM | POA: Diagnosis not present

## 2019-12-03 DIAGNOSIS — N2581 Secondary hyperparathyroidism of renal origin: Secondary | ICD-10-CM | POA: Diagnosis not present

## 2019-12-04 ENCOUNTER — Ambulatory Visit (INDEPENDENT_AMBULATORY_CARE_PROVIDER_SITE_OTHER): Payer: Medicare Other | Admitting: Podiatry

## 2019-12-04 ENCOUNTER — Other Ambulatory Visit: Payer: Self-pay

## 2019-12-04 ENCOUNTER — Encounter: Payer: Self-pay | Admitting: Podiatry

## 2019-12-04 DIAGNOSIS — M79675 Pain in left toe(s): Secondary | ICD-10-CM | POA: Diagnosis not present

## 2019-12-04 DIAGNOSIS — M79674 Pain in right toe(s): Secondary | ICD-10-CM | POA: Diagnosis not present

## 2019-12-04 DIAGNOSIS — E1142 Type 2 diabetes mellitus with diabetic polyneuropathy: Secondary | ICD-10-CM

## 2019-12-04 DIAGNOSIS — M14672 Charcot's joint, left ankle and foot: Secondary | ICD-10-CM

## 2019-12-04 DIAGNOSIS — B351 Tinea unguium: Secondary | ICD-10-CM | POA: Diagnosis not present

## 2019-12-05 DIAGNOSIS — N186 End stage renal disease: Secondary | ICD-10-CM | POA: Diagnosis not present

## 2019-12-05 DIAGNOSIS — N2581 Secondary hyperparathyroidism of renal origin: Secondary | ICD-10-CM | POA: Diagnosis not present

## 2019-12-05 DIAGNOSIS — D509 Iron deficiency anemia, unspecified: Secondary | ICD-10-CM | POA: Diagnosis not present

## 2019-12-05 DIAGNOSIS — Z992 Dependence on renal dialysis: Secondary | ICD-10-CM | POA: Diagnosis not present

## 2019-12-05 DIAGNOSIS — D689 Coagulation defect, unspecified: Secondary | ICD-10-CM | POA: Diagnosis not present

## 2019-12-05 DIAGNOSIS — D631 Anemia in chronic kidney disease: Secondary | ICD-10-CM | POA: Diagnosis not present

## 2019-12-07 DIAGNOSIS — D689 Coagulation defect, unspecified: Secondary | ICD-10-CM | POA: Diagnosis not present

## 2019-12-07 DIAGNOSIS — N186 End stage renal disease: Secondary | ICD-10-CM | POA: Diagnosis not present

## 2019-12-07 DIAGNOSIS — N2581 Secondary hyperparathyroidism of renal origin: Secondary | ICD-10-CM | POA: Diagnosis not present

## 2019-12-07 DIAGNOSIS — D509 Iron deficiency anemia, unspecified: Secondary | ICD-10-CM | POA: Diagnosis not present

## 2019-12-07 DIAGNOSIS — D631 Anemia in chronic kidney disease: Secondary | ICD-10-CM | POA: Diagnosis not present

## 2019-12-07 DIAGNOSIS — Z992 Dependence on renal dialysis: Secondary | ICD-10-CM | POA: Diagnosis not present

## 2019-12-08 NOTE — Progress Notes (Signed)
Subjective: [name] presents today for follow up of preventative diabetic foot care: and painful mycotic nails b/l that are difficult to trim. Pain interferes with ambulation. Aggravating factors include wearing enclosed shoe gear. Pain is relieved with periodic professional debridement.   Allergies  Allergen Reactions  . Penicillin G   . Penicillins Nausea And Vomiting and Other (See Comments)    Has patient had a PCN reaction causing immediate rash, facial/tongue/throat swelling, SOB or lightheadedness with hypotension: No Has patient had a PCN reaction causing severe rash involving mucus membranes or skin necrosis: No Has patient had a PCN reaction that required hospitalization No Has patient had a PCN reaction occurring within the last 10 years: No If all of the above answers are "NO", then may proceed with Cephalosporin use.  Marland Kitchen Propolis Nausea And Vomiting  . Vioxx [Rofecoxib] Nausea And Vomiting     Objective: There were no vitals filed for this visit.  Vascular Examination:  capillary fill time to digits <3s b/l, palpable DP pulses b/l, palpable PT pulses b/l, pedal hair absent b/l and skin temperature gradient within normal limits b/l  Dermatological Examination: Pedal skin with normal turgor, texture and tone bilaterally., No open wounds bilaterally. and No interdigital macerations bilaterally.  onychomycosis of the toenails b/l with elongation, dystrophy, discoloration, thickening, subungual debris and pain to palpation of nail beds  Musculoskeletal: normal muscle strength 5/5 to all lower extremity muscle groups bilaterally, limited ROM left ankle and Charcot deformity left LE.  Neurological: sensation absent with 10g monofilament and vibratory sensation absent  Assessment: 1. Pain due to onychomycosis of toenails of both feet   2. Diabetic peripheral neuropathy associated with type 2 diabetes mellitus (HCC)   3. Charcot ankle, left     Plan: Continue  diabetic foot care principles. Literature dispensed on today.  -Toenails 1-5 b/l were debrided in length and girth without iatrogenic bleeding. -Patient to continue soft, supportive shoe gear daily. -Patient to report any pedal injuries to medical professional immediately. -Patient/POA to call should there be question/concern in the interim.  Return in about 9 weeks (around 02/05/2020).

## 2019-12-10 DIAGNOSIS — D509 Iron deficiency anemia, unspecified: Secondary | ICD-10-CM | POA: Diagnosis not present

## 2019-12-10 DIAGNOSIS — N186 End stage renal disease: Secondary | ICD-10-CM | POA: Diagnosis not present

## 2019-12-10 DIAGNOSIS — Z992 Dependence on renal dialysis: Secondary | ICD-10-CM | POA: Diagnosis not present

## 2019-12-10 DIAGNOSIS — N2581 Secondary hyperparathyroidism of renal origin: Secondary | ICD-10-CM | POA: Diagnosis not present

## 2019-12-10 DIAGNOSIS — D631 Anemia in chronic kidney disease: Secondary | ICD-10-CM | POA: Diagnosis not present

## 2019-12-10 DIAGNOSIS — D689 Coagulation defect, unspecified: Secondary | ICD-10-CM | POA: Diagnosis not present

## 2019-12-12 DIAGNOSIS — D509 Iron deficiency anemia, unspecified: Secondary | ICD-10-CM | POA: Diagnosis not present

## 2019-12-12 DIAGNOSIS — N2581 Secondary hyperparathyroidism of renal origin: Secondary | ICD-10-CM | POA: Diagnosis not present

## 2019-12-12 DIAGNOSIS — N186 End stage renal disease: Secondary | ICD-10-CM | POA: Diagnosis not present

## 2019-12-12 DIAGNOSIS — D631 Anemia in chronic kidney disease: Secondary | ICD-10-CM | POA: Diagnosis not present

## 2019-12-12 DIAGNOSIS — E119 Type 2 diabetes mellitus without complications: Secondary | ICD-10-CM | POA: Diagnosis not present

## 2019-12-12 DIAGNOSIS — D689 Coagulation defect, unspecified: Secondary | ICD-10-CM | POA: Diagnosis not present

## 2019-12-12 DIAGNOSIS — Z992 Dependence on renal dialysis: Secondary | ICD-10-CM | POA: Diagnosis not present

## 2019-12-14 DIAGNOSIS — D689 Coagulation defect, unspecified: Secondary | ICD-10-CM | POA: Diagnosis not present

## 2019-12-14 DIAGNOSIS — N2581 Secondary hyperparathyroidism of renal origin: Secondary | ICD-10-CM | POA: Diagnosis not present

## 2019-12-14 DIAGNOSIS — N186 End stage renal disease: Secondary | ICD-10-CM | POA: Diagnosis not present

## 2019-12-14 DIAGNOSIS — D631 Anemia in chronic kidney disease: Secondary | ICD-10-CM | POA: Diagnosis not present

## 2019-12-14 DIAGNOSIS — D509 Iron deficiency anemia, unspecified: Secondary | ICD-10-CM | POA: Diagnosis not present

## 2019-12-14 DIAGNOSIS — Z992 Dependence on renal dialysis: Secondary | ICD-10-CM | POA: Diagnosis not present

## 2019-12-17 DIAGNOSIS — N2581 Secondary hyperparathyroidism of renal origin: Secondary | ICD-10-CM | POA: Diagnosis not present

## 2019-12-17 DIAGNOSIS — D689 Coagulation defect, unspecified: Secondary | ICD-10-CM | POA: Diagnosis not present

## 2019-12-17 DIAGNOSIS — Z992 Dependence on renal dialysis: Secondary | ICD-10-CM | POA: Diagnosis not present

## 2019-12-17 DIAGNOSIS — D631 Anemia in chronic kidney disease: Secondary | ICD-10-CM | POA: Diagnosis not present

## 2019-12-17 DIAGNOSIS — Z23 Encounter for immunization: Secondary | ICD-10-CM | POA: Diagnosis not present

## 2019-12-17 DIAGNOSIS — D509 Iron deficiency anemia, unspecified: Secondary | ICD-10-CM | POA: Diagnosis not present

## 2019-12-17 DIAGNOSIS — E1129 Type 2 diabetes mellitus with other diabetic kidney complication: Secondary | ICD-10-CM | POA: Diagnosis not present

## 2019-12-17 DIAGNOSIS — N186 End stage renal disease: Secondary | ICD-10-CM | POA: Diagnosis not present

## 2019-12-18 DIAGNOSIS — Z961 Presence of intraocular lens: Secondary | ICD-10-CM | POA: Diagnosis not present

## 2019-12-18 DIAGNOSIS — Z794 Long term (current) use of insulin: Secondary | ICD-10-CM | POA: Diagnosis not present

## 2019-12-18 DIAGNOSIS — H40013 Open angle with borderline findings, low risk, bilateral: Secondary | ICD-10-CM | POA: Diagnosis not present

## 2019-12-18 DIAGNOSIS — E119 Type 2 diabetes mellitus without complications: Secondary | ICD-10-CM | POA: Diagnosis not present

## 2019-12-19 DIAGNOSIS — Z992 Dependence on renal dialysis: Secondary | ICD-10-CM | POA: Diagnosis not present

## 2019-12-19 DIAGNOSIS — N2581 Secondary hyperparathyroidism of renal origin: Secondary | ICD-10-CM | POA: Diagnosis not present

## 2019-12-19 DIAGNOSIS — D509 Iron deficiency anemia, unspecified: Secondary | ICD-10-CM | POA: Diagnosis not present

## 2019-12-19 DIAGNOSIS — N186 End stage renal disease: Secondary | ICD-10-CM | POA: Diagnosis not present

## 2019-12-19 DIAGNOSIS — D689 Coagulation defect, unspecified: Secondary | ICD-10-CM | POA: Diagnosis not present

## 2019-12-19 DIAGNOSIS — D631 Anemia in chronic kidney disease: Secondary | ICD-10-CM | POA: Diagnosis not present

## 2019-12-21 DIAGNOSIS — Z992 Dependence on renal dialysis: Secondary | ICD-10-CM | POA: Diagnosis not present

## 2019-12-21 DIAGNOSIS — D509 Iron deficiency anemia, unspecified: Secondary | ICD-10-CM | POA: Diagnosis not present

## 2019-12-21 DIAGNOSIS — N2581 Secondary hyperparathyroidism of renal origin: Secondary | ICD-10-CM | POA: Diagnosis not present

## 2019-12-21 DIAGNOSIS — N186 End stage renal disease: Secondary | ICD-10-CM | POA: Diagnosis not present

## 2019-12-21 DIAGNOSIS — D689 Coagulation defect, unspecified: Secondary | ICD-10-CM | POA: Diagnosis not present

## 2019-12-21 DIAGNOSIS — D631 Anemia in chronic kidney disease: Secondary | ICD-10-CM | POA: Diagnosis not present

## 2019-12-24 ENCOUNTER — Other Ambulatory Visit: Payer: Self-pay

## 2019-12-24 DIAGNOSIS — N186 End stage renal disease: Secondary | ICD-10-CM

## 2019-12-24 DIAGNOSIS — Z992 Dependence on renal dialysis: Secondary | ICD-10-CM

## 2019-12-24 DIAGNOSIS — N2581 Secondary hyperparathyroidism of renal origin: Secondary | ICD-10-CM | POA: Diagnosis not present

## 2019-12-24 DIAGNOSIS — D689 Coagulation defect, unspecified: Secondary | ICD-10-CM | POA: Diagnosis not present

## 2019-12-24 DIAGNOSIS — D631 Anemia in chronic kidney disease: Secondary | ICD-10-CM | POA: Diagnosis not present

## 2019-12-24 DIAGNOSIS — D509 Iron deficiency anemia, unspecified: Secondary | ICD-10-CM | POA: Diagnosis not present

## 2019-12-24 NOTE — Addendum Note (Signed)
Addended by: Karn Cassis on: 12/24/2019 05:26 PM   Modules accepted: Orders

## 2019-12-25 ENCOUNTER — Ambulatory Visit (INDEPENDENT_AMBULATORY_CARE_PROVIDER_SITE_OTHER)
Admission: RE | Admit: 2019-12-25 | Discharge: 2019-12-25 | Disposition: A | Discharge status: Home / Self Care | Payer: Medicare Other | Source: Ambulatory Visit | Attending: Vascular Surgery | Admitting: Vascular Surgery

## 2019-12-25 ENCOUNTER — Other Ambulatory Visit: Payer: Self-pay

## 2019-12-25 ENCOUNTER — Ambulatory Visit (HOSPITAL_COMMUNITY)
Admission: RE | Admit: 2019-12-25 | Discharge: 2019-12-25 | Disposition: A | Discharge status: Home / Self Care | Payer: Medicare Other | Source: Ambulatory Visit | Attending: Vascular Surgery | Admitting: Vascular Surgery

## 2019-12-25 ENCOUNTER — Ambulatory Visit (INDEPENDENT_AMBULATORY_CARE_PROVIDER_SITE_OTHER): Payer: Medicare Other | Admitting: Vascular Surgery

## 2019-12-25 ENCOUNTER — Encounter: Payer: Self-pay | Admitting: Vascular Surgery

## 2019-12-25 VITALS — BP 114/78 | HR 86 | Temp 98.0°F | Resp 20 | Ht 72.0 in | Wt 297.0 lb

## 2019-12-25 DIAGNOSIS — N186 End stage renal disease: Secondary | ICD-10-CM | POA: Diagnosis not present

## 2019-12-25 DIAGNOSIS — Z992 Dependence on renal dialysis: Secondary | ICD-10-CM

## 2019-12-25 NOTE — Progress Notes (Signed)
[facility]  Patient name: [name] MRN: [id number] DOB: 09-Aug-1956 Sex: male  REASON FOR VISIT: Discuss new access for hemodialysis  HPI: [name] is a 64 y.o. male here today for discussion of new access for hemodialysis.  He had been undergoing dialysis via a right radiocephalic fistula placed years ago.  He had undergone a revision in September 2020 and had replacement of a segment with Artegraft.  He underwent right nephrectomy and reports that his stool occluded during this time.  He is currently being dialyzed via a tunneled hemodialysis catheter.  He reports no infection with this but has had some difficulty with runs of this.  He is left-handed.  Past Medical History:  Diagnosis Date  . Alcohol abuse   . Congestive heart disease ([place]) 12/2011  . Diabetes mellitus   . GERD (gastroesophageal reflux disease)   . Gout   . Headache   . Hepatitis    being treated for Hepatitis C  . HOH (hard of hearing)   . Hyperlipidemia   . Hyperlipidemia   . Hypertension   . Monoclonal gammopathies 02/25/2012  . Obesity   . Pedal edema   . Pneumonia   . Renal insufficiency   . Sleep apnea    uses cpap  . Tobacco abuse   . Wears hearing aid    b/l    Family History  Problem Relation Age of Onset  . Diabetes Mother   . Heart disease Mother   . Lung cancer Father     SOCIAL HISTORY: Social History   Tobacco Use  . Smoking status: Former Smoker    Packs/day: 0.30    Years: 33.00    Pack years: 9.90    Types: Cigarettes    Quit date: 07/16/2010    Years since quitting: 9.4  . Smokeless tobacco: Never Used  Substance Use Topics  . Alcohol use: No    Allergies  Allergen Reactions  . Penicillin G   . Penicillins Nausea And Vomiting and Other (See Comments)    Has patient had a PCN reaction causing immediate rash, facial/tongue/throat swelling, SOB or lightheadedness with hypotension: No Has  patient had a PCN reaction causing severe rash involving mucus membranes or skin necrosis: No Has patient had a PCN reaction that required hospitalization No Has patient had a PCN reaction occurring within the last 10 years: No If all of the above answers are "NO", then may proceed with Cephalosporin use.  Marland Kitchen Propolis Nausea And Vomiting  . Vioxx [Rofecoxib] Nausea And Vomiting    Current Outpatient Medications  Medication Sig Dispense Refill  . acetaminophen (TYLENOL) 325 MG tablet Take 650 mg by mouth every 6 (six) hours as needed for mild pain, moderate pain, fever or headache.     . allopurinol (ZYLOPRIM) 100 MG tablet Take 1 tablet (100 mg total) by mouth daily. (Patient taking differently: Take 100 mg by mouth 2 (two) times daily. ) 30 tablet 2  . amLODipine (NORVASC) 5 MG tablet Take 5 mg by mouth daily.    [name] 1 GM 210 MG(Fe) tablet Take 420 mg by mouth 2 (two) times daily with a meal.   12  . carvedilol (COREG) 6.25 MG tablet Take 6.25 mg by mouth 2 (two) times daily with a meal.    . cinacalcet (SENSIPAR) 90 MG tablet Take 90 mg by mouth every Monday, Wednesday, and Friday.     . clotrimazole (LOTRIMIN) 1 % cream  Apply to both feet and between toes twice daily 30 g 2  . COLCRYS 0.6 MG tablet Take 0.6 mg by mouth daily as needed (gout).     Marland Kitchen diphenhydrAMINE (BENADRYL) 25 MG tablet Take 25 mg by mouth every Monday, Wednesday, and Friday.    . furosemide (LASIX) 40 MG tablet Take 40 mg by mouth daily.    Marland Kitchen gabapentin (NEURONTIN) 100 MG capsule Take 100 mg by mouth 2 (two) times daily.     . heparin 1000 UNIT/ML injection Heparin Sodium (Porcine) 1,000 Units/mL Systemic    . HYDROcodone-acetaminophen (NORCO) 5-325 MG tablet Take 1 tablet by mouth every 6 (six) hours as needed for moderate pain. 12 tablet 0  . Insulin Detemir (LEVEMIR FLEXTOUCH) 100 UNIT/ML Pen Inject 20 Units into the skin at bedtime.     Marland Kitchen omeprazole (PRILOSEC) 20 MG capsule Take by mouth daily.    . pravastatin  (PRAVACHOL) 20 MG tablet Take 20 mg by mouth daily.      No current facility-administered medications for this visit.    REVIEW OF SYSTEMS:  [X]  denotes positive finding, [ ]  denotes negative finding Cardiac  Comments:  Chest pain or chest pressure:    Shortness of breath upon exertion:    Short of breath when lying flat:    Irregular heart rhythm:        Vascular    Pain in calf, thigh, or hip brought on by ambulation:    Pain in feet at night that wakes you up from your sleep:     Blood clot in your veins:    Leg swelling:           PHYSICAL EXAM: Vitals:   12/25/19 0904  BP: 114/78  Pulse: 86  Resp: 20  Temp: 98 F (36.7 C)  SpO2: 97%  Weight: 297 lb (134.7 kg)  Height: 6' (1.829 m)    GENERAL: The patient is a well-nourished male, in no acute distress. The vital signs are documented above. CARDIOVASCULAR: 2+ radial pulses bilaterally.  Large superficial cephalic vein at the wrist on the left.  He does have occluded somewhat aneurysmal and scarred right radiocephalic fistula. PULMONARY: There is good air exchange  MUSCULOSKELETAL: There are no major deformities or cyanosis. NEUROLOGIC: No focal weakness or paresthesias are detected. SKIN: There are no ulcers or rashes noted. PSYCHIATRIC: The patient has a normal affect.  DATA:  Duplex today reveals normal arterial flow bilaterally.  He does have occlusion of his cephalic vein in his right upper arm as well extending to the subclavian.  He does have a large caliber basilic vein.  He has a nice sized cephalic vein in his left forearm.  MEDICAL ISSUES: Had long discussion the patient regarding options.  If he wants to continue dialysis in his nondominant arm, I would recommend a basilic vein transposition fistula.  This in all likelihood could be done in 1 setting.  He is not pleased with the idea of access in his upper arm.  I explained that he does appear to have an excellent cephalic vein in his left forearm and would  be a candidate for a radiocephalic fistula in his dominant left arm.  He will be scheduled to soon as possible on a nondialysis day.  He will make a final decision regarding laterality.    Rosetta Posner, MD FACS [facility] Tel [phone] Pager [phone]

## 2019-12-26 DIAGNOSIS — D689 Coagulation defect, unspecified: Secondary | ICD-10-CM | POA: Diagnosis not present

## 2019-12-26 DIAGNOSIS — D509 Iron deficiency anemia, unspecified: Secondary | ICD-10-CM | POA: Diagnosis not present

## 2019-12-26 DIAGNOSIS — N2581 Secondary hyperparathyroidism of renal origin: Secondary | ICD-10-CM | POA: Diagnosis not present

## 2019-12-26 DIAGNOSIS — D631 Anemia in chronic kidney disease: Secondary | ICD-10-CM | POA: Diagnosis not present

## 2019-12-26 DIAGNOSIS — Z992 Dependence on renal dialysis: Secondary | ICD-10-CM | POA: Diagnosis not present

## 2019-12-26 DIAGNOSIS — N186 End stage renal disease: Secondary | ICD-10-CM | POA: Diagnosis not present

## 2019-12-28 DIAGNOSIS — D509 Iron deficiency anemia, unspecified: Secondary | ICD-10-CM | POA: Diagnosis not present

## 2019-12-28 DIAGNOSIS — D689 Coagulation defect, unspecified: Secondary | ICD-10-CM | POA: Diagnosis not present

## 2019-12-28 DIAGNOSIS — Z992 Dependence on renal dialysis: Secondary | ICD-10-CM | POA: Diagnosis not present

## 2019-12-28 DIAGNOSIS — D631 Anemia in chronic kidney disease: Secondary | ICD-10-CM | POA: Diagnosis not present

## 2019-12-28 DIAGNOSIS — N2581 Secondary hyperparathyroidism of renal origin: Secondary | ICD-10-CM | POA: Diagnosis not present

## 2019-12-28 DIAGNOSIS — N186 End stage renal disease: Secondary | ICD-10-CM | POA: Diagnosis not present

## 2019-12-31 DIAGNOSIS — Z992 Dependence on renal dialysis: Secondary | ICD-10-CM | POA: Diagnosis not present

## 2019-12-31 DIAGNOSIS — D509 Iron deficiency anemia, unspecified: Secondary | ICD-10-CM | POA: Diagnosis not present

## 2019-12-31 DIAGNOSIS — D689 Coagulation defect, unspecified: Secondary | ICD-10-CM | POA: Diagnosis not present

## 2019-12-31 DIAGNOSIS — D631 Anemia in chronic kidney disease: Secondary | ICD-10-CM | POA: Diagnosis not present

## 2019-12-31 DIAGNOSIS — N2581 Secondary hyperparathyroidism of renal origin: Secondary | ICD-10-CM | POA: Diagnosis not present

## 2019-12-31 DIAGNOSIS — N186 End stage renal disease: Secondary | ICD-10-CM | POA: Diagnosis not present

## 2020-01-02 DIAGNOSIS — D509 Iron deficiency anemia, unspecified: Secondary | ICD-10-CM | POA: Diagnosis not present

## 2020-01-02 DIAGNOSIS — N186 End stage renal disease: Secondary | ICD-10-CM | POA: Diagnosis not present

## 2020-01-02 DIAGNOSIS — Z992 Dependence on renal dialysis: Secondary | ICD-10-CM | POA: Diagnosis not present

## 2020-01-02 DIAGNOSIS — D689 Coagulation defect, unspecified: Secondary | ICD-10-CM | POA: Diagnosis not present

## 2020-01-02 DIAGNOSIS — D631 Anemia in chronic kidney disease: Secondary | ICD-10-CM | POA: Diagnosis not present

## 2020-01-02 DIAGNOSIS — N2581 Secondary hyperparathyroidism of renal origin: Secondary | ICD-10-CM | POA: Diagnosis not present

## 2020-01-07 DIAGNOSIS — D631 Anemia in chronic kidney disease: Secondary | ICD-10-CM | POA: Diagnosis not present

## 2020-01-07 DIAGNOSIS — D689 Coagulation defect, unspecified: Secondary | ICD-10-CM | POA: Diagnosis not present

## 2020-01-07 DIAGNOSIS — K219 Gastro-esophageal reflux disease without esophagitis: Secondary | ICD-10-CM | POA: Diagnosis not present

## 2020-01-07 DIAGNOSIS — N2581 Secondary hyperparathyroidism of renal origin: Secondary | ICD-10-CM | POA: Diagnosis not present

## 2020-01-07 DIAGNOSIS — R05 Cough: Secondary | ICD-10-CM | POA: Diagnosis not present

## 2020-01-07 DIAGNOSIS — R112 Nausea with vomiting, unspecified: Secondary | ICD-10-CM | POA: Diagnosis not present

## 2020-01-07 DIAGNOSIS — Z992 Dependence on renal dialysis: Secondary | ICD-10-CM | POA: Diagnosis not present

## 2020-01-07 DIAGNOSIS — N186 End stage renal disease: Secondary | ICD-10-CM | POA: Diagnosis not present

## 2020-01-07 DIAGNOSIS — D509 Iron deficiency anemia, unspecified: Secondary | ICD-10-CM | POA: Diagnosis not present

## 2020-01-08 ENCOUNTER — Other Ambulatory Visit (HOSPITAL_COMMUNITY)
Admission: RE | Admit: 2020-01-08 | Discharge: 2020-01-08 | Disposition: A | Discharge status: Home / Self Care | Payer: Medicare Other | Source: Ambulatory Visit | Attending: Vascular Surgery | Admitting: Vascular Surgery

## 2020-01-08 DIAGNOSIS — N186 End stage renal disease: Secondary | ICD-10-CM | POA: Diagnosis not present

## 2020-01-08 DIAGNOSIS — J13 Pneumonia due to Streptococcus pneumoniae: Secondary | ICD-10-CM | POA: Diagnosis not present

## 2020-01-08 DIAGNOSIS — J1282 Pneumonia due to coronavirus disease 2019: Secondary | ICD-10-CM | POA: Diagnosis not present

## 2020-01-08 DIAGNOSIS — J9601 Acute respiratory failure with hypoxia: Secondary | ICD-10-CM | POA: Diagnosis not present

## 2020-01-08 DIAGNOSIS — Z20822 Contact with and (suspected) exposure to covid-19: Secondary | ICD-10-CM | POA: Insufficient documentation

## 2020-01-08 DIAGNOSIS — U071 COVID-19: Secondary | ICD-10-CM | POA: Diagnosis not present

## 2020-01-08 DIAGNOSIS — Z01812 Encounter for preprocedural laboratory examination: Secondary | ICD-10-CM | POA: Insufficient documentation

## 2020-01-08 DIAGNOSIS — A403 Sepsis due to Streptococcus pneumoniae: Secondary | ICD-10-CM | POA: Diagnosis not present

## 2020-01-08 DIAGNOSIS — R0602 Shortness of breath: Secondary | ICD-10-CM | POA: Diagnosis not present

## 2020-01-08 DIAGNOSIS — E8770 Fluid overload, unspecified: Secondary | ICD-10-CM | POA: Diagnosis not present

## 2020-01-08 DIAGNOSIS — R05 Cough: Secondary | ICD-10-CM | POA: Diagnosis not present

## 2020-01-08 LAB — SARS CORONAVIRUS 2 (TAT 6-24 HRS): SARS Coronavirus 2: NEGATIVE

## 2020-01-09 ENCOUNTER — Encounter (HOSPITAL_COMMUNITY): Payer: Self-pay | Admitting: Certified Registered Nurse Anesthetist

## 2020-01-09 ENCOUNTER — Encounter (HOSPITAL_COMMUNITY): Payer: Self-pay | Admitting: Vascular Surgery

## 2020-01-09 ENCOUNTER — Other Ambulatory Visit: Payer: Self-pay

## 2020-01-09 MED ORDER — VANCOMYCIN HCL 1500 MG/300ML IV SOLN
1500.0000 mg | INTRAVENOUS | Status: DC
  Filled 2020-01-09 (×2): qty 300

## 2020-01-09 NOTE — Progress Notes (Signed)
Pt denies SOB and chest pain. Pt denies having a cardiac cath but stated that a stress test was recently performed at the [facility] in [place] and an EKG and chest x ray was performed at the [facility] in [place]; nurse requested records and awaiting response. Pt stated that he will bring medications in with him on DOS.  Pt made aware to stop taking vitamins, fish oiland herbal medications. Do not take any NSAIDs ie: Ibuprofen, Advil, Naproxen BC and Goody Powder. Pt made aware to take 10 units of Levemir insulin at HS. Pt made aware to check his CBG every 2 hours prior to arrival to hospital, treat a CBG < 70 with 4 ounces of Apple or Cranberry Juice, wait 15 minutes after drinking the juice to recheck CBG, if it remains < 70 call and speak with a nurse on the SS unit. Pt reminded to quarantine. Pt made aware to call surgeon today, if he plans to cancel surgery. Pt verbalized understanding of all pre-op instructions.

## 2020-01-10 ENCOUNTER — Ambulatory Visit (HOSPITAL_COMMUNITY): Admission: RE | Admit: 2020-01-10 | Payer: Medicare Other | Source: Ambulatory Visit | Admitting: Vascular Surgery

## 2020-01-10 SURGERY — TRANSPOSITION, VEIN, BASILIC
Anesthesia: Monitor Anesthesia Care | Laterality: Bilateral

## 2020-01-11 ENCOUNTER — Emergency Department (HOSPITAL_COMMUNITY)
Admission: EM | Admit: 2020-01-11 | Discharge: 2020-01-11 | Disposition: A | Discharge status: Home / Self Care | Payer: Medicare Other | Source: Home / Self Care | Attending: Emergency Medicine | Admitting: Emergency Medicine

## 2020-01-11 ENCOUNTER — Emergency Department (HOSPITAL_COMMUNITY): Payer: Medicare Other

## 2020-01-11 ENCOUNTER — Other Ambulatory Visit: Payer: Self-pay

## 2020-01-11 ENCOUNTER — Encounter (HOSPITAL_COMMUNITY): Payer: Self-pay

## 2020-01-11 ENCOUNTER — Inpatient Hospital Stay (HOSPITAL_COMMUNITY)
Admission: EM | Admit: 2020-01-11 | Discharge: 2020-01-21 | DRG: 871 | Disposition: A | Discharge status: Home / Self Care | Payer: Medicare Other | Attending: Internal Medicine | Admitting: Internal Medicine

## 2020-01-11 ENCOUNTER — Encounter (HOSPITAL_COMMUNITY): Payer: Self-pay | Admitting: Emergency Medicine

## 2020-01-11 DIAGNOSIS — E1129 Type 2 diabetes mellitus with other diabetic kidney complication: Secondary | ICD-10-CM | POA: Diagnosis not present

## 2020-01-11 DIAGNOSIS — N2581 Secondary hyperparathyroidism of renal origin: Secondary | ICD-10-CM | POA: Diagnosis present

## 2020-01-11 DIAGNOSIS — G4730 Sleep apnea, unspecified: Secondary | ICD-10-CM | POA: Diagnosis not present

## 2020-01-11 DIAGNOSIS — E0829 Diabetes mellitus due to underlying condition with other diabetic kidney complication: Secondary | ICD-10-CM | POA: Diagnosis not present

## 2020-01-11 DIAGNOSIS — L97529 Non-pressure chronic ulcer of other part of left foot with unspecified severity: Secondary | ICD-10-CM | POA: Diagnosis present

## 2020-01-11 DIAGNOSIS — E1122 Type 2 diabetes mellitus with diabetic chronic kidney disease: Secondary | ICD-10-CM | POA: Diagnosis present

## 2020-01-11 DIAGNOSIS — Z794 Long term (current) use of insulin: Secondary | ICD-10-CM | POA: Insufficient documentation

## 2020-01-11 DIAGNOSIS — E871 Hypo-osmolality and hyponatremia: Secondary | ICD-10-CM

## 2020-01-11 DIAGNOSIS — R9389 Abnormal findings on diagnostic imaging of other specified body structures: Secondary | ICD-10-CM | POA: Diagnosis not present

## 2020-01-11 DIAGNOSIS — E11621 Type 2 diabetes mellitus with foot ulcer: Secondary | ICD-10-CM | POA: Diagnosis present

## 2020-01-11 DIAGNOSIS — E1165 Type 2 diabetes mellitus with hyperglycemia: Secondary | ICD-10-CM | POA: Diagnosis present

## 2020-01-11 DIAGNOSIS — U071 COVID-19: Secondary | ICD-10-CM | POA: Diagnosis present

## 2020-01-11 DIAGNOSIS — Z880 Allergy status to penicillin: Secondary | ICD-10-CM

## 2020-01-11 DIAGNOSIS — I10 Essential (primary) hypertension: Secondary | ICD-10-CM | POA: Diagnosis not present

## 2020-01-11 DIAGNOSIS — Z6837 Body mass index (BMI) 37.0-37.9, adult: Secondary | ICD-10-CM

## 2020-01-11 DIAGNOSIS — E119 Type 2 diabetes mellitus without complications: Secondary | ICD-10-CM

## 2020-01-11 DIAGNOSIS — K5732 Diverticulitis of large intestine without perforation or abscess without bleeding: Secondary | ICD-10-CM | POA: Diagnosis present

## 2020-01-11 DIAGNOSIS — Z905 Acquired absence of kidney: Secondary | ICD-10-CM

## 2020-01-11 DIAGNOSIS — R059 Cough, unspecified: Secondary | ICD-10-CM

## 2020-01-11 DIAGNOSIS — A403 Sepsis due to Streptococcus pneumoniae: Secondary | ICD-10-CM | POA: Diagnosis present

## 2020-01-11 DIAGNOSIS — Z85528 Personal history of other malignant neoplasm of kidney: Secondary | ICD-10-CM

## 2020-01-11 DIAGNOSIS — K5730 Diverticulosis of large intestine without perforation or abscess without bleeding: Secondary | ICD-10-CM | POA: Diagnosis not present

## 2020-01-11 DIAGNOSIS — Z452 Encounter for adjustment and management of vascular access device: Secondary | ICD-10-CM | POA: Diagnosis not present

## 2020-01-11 DIAGNOSIS — G4733 Obstructive sleep apnea (adult) (pediatric): Secondary | ICD-10-CM | POA: Diagnosis present

## 2020-01-11 DIAGNOSIS — J9691 Respiratory failure, unspecified with hypoxia: Secondary | ICD-10-CM

## 2020-01-11 DIAGNOSIS — D631 Anemia in chronic kidney disease: Secondary | ICD-10-CM | POA: Diagnosis present

## 2020-01-11 DIAGNOSIS — B182 Chronic viral hepatitis C: Secondary | ICD-10-CM | POA: Diagnosis present

## 2020-01-11 DIAGNOSIS — E870 Hyperosmolality and hypernatremia: Secondary | ICD-10-CM | POA: Diagnosis present

## 2020-01-11 DIAGNOSIS — E875 Hyperkalemia: Secondary | ICD-10-CM | POA: Diagnosis not present

## 2020-01-11 DIAGNOSIS — L8990 Pressure ulcer of unspecified site, unspecified stage: Secondary | ICD-10-CM | POA: Insufficient documentation

## 2020-01-11 DIAGNOSIS — J13 Pneumonia due to Streptococcus pneumoniae: Secondary | ICD-10-CM | POA: Diagnosis present

## 2020-01-11 DIAGNOSIS — Z833 Family history of diabetes mellitus: Secondary | ICD-10-CM

## 2020-01-11 DIAGNOSIS — Z87891 Personal history of nicotine dependence: Secondary | ICD-10-CM | POA: Insufficient documentation

## 2020-01-11 DIAGNOSIS — Z801 Family history of malignant neoplasm of trachea, bronchus and lung: Secondary | ICD-10-CM

## 2020-01-11 DIAGNOSIS — I132 Hypertensive heart and chronic kidney disease with heart failure and with stage 5 chronic kidney disease, or end stage renal disease: Secondary | ICD-10-CM | POA: Insufficient documentation

## 2020-01-11 DIAGNOSIS — N186 End stage renal disease: Secondary | ICD-10-CM | POA: Diagnosis present

## 2020-01-11 DIAGNOSIS — J1282 Pneumonia due to coronavirus disease 2019: Secondary | ICD-10-CM | POA: Diagnosis present

## 2020-01-11 DIAGNOSIS — I120 Hypertensive chronic kidney disease with stage 5 chronic kidney disease or end stage renal disease: Secondary | ICD-10-CM | POA: Diagnosis not present

## 2020-01-11 DIAGNOSIS — E785 Hyperlipidemia, unspecified: Secondary | ICD-10-CM | POA: Diagnosis present

## 2020-01-11 DIAGNOSIS — R05 Cough: Secondary | ICD-10-CM | POA: Insufficient documentation

## 2020-01-11 DIAGNOSIS — D689 Coagulation defect, unspecified: Secondary | ICD-10-CM | POA: Diagnosis not present

## 2020-01-11 DIAGNOSIS — E8779 Other fluid overload: Secondary | ICD-10-CM

## 2020-01-11 DIAGNOSIS — K219 Gastro-esophageal reflux disease without esophagitis: Secondary | ICD-10-CM | POA: Diagnosis present

## 2020-01-11 DIAGNOSIS — R0602 Shortness of breath: Secondary | ICD-10-CM | POA: Diagnosis not present

## 2020-01-11 DIAGNOSIS — R918 Other nonspecific abnormal finding of lung field: Secondary | ICD-10-CM

## 2020-01-11 DIAGNOSIS — E11610 Type 2 diabetes mellitus with diabetic neuropathic arthropathy: Secondary | ICD-10-CM | POA: Diagnosis present

## 2020-01-11 DIAGNOSIS — Z8249 Family history of ischemic heart disease and other diseases of the circulatory system: Secondary | ICD-10-CM

## 2020-01-11 DIAGNOSIS — Z992 Dependence on renal dialysis: Secondary | ICD-10-CM

## 2020-01-11 DIAGNOSIS — J9601 Acute respiratory failure with hypoxia: Secondary | ICD-10-CM | POA: Diagnosis present

## 2020-01-11 DIAGNOSIS — D649 Anemia, unspecified: Secondary | ICD-10-CM | POA: Diagnosis not present

## 2020-01-11 DIAGNOSIS — Z4901 Encounter for fitting and adjustment of extracorporeal dialysis catheter: Secondary | ICD-10-CM | POA: Diagnosis not present

## 2020-01-11 DIAGNOSIS — Z79899 Other long term (current) drug therapy: Secondary | ICD-10-CM

## 2020-01-11 DIAGNOSIS — D509 Iron deficiency anemia, unspecified: Secondary | ICD-10-CM | POA: Diagnosis not present

## 2020-01-11 DIAGNOSIS — I509 Heart failure, unspecified: Secondary | ICD-10-CM | POA: Diagnosis present

## 2020-01-11 DIAGNOSIS — E8770 Fluid overload, unspecified: Secondary | ICD-10-CM | POA: Diagnosis not present

## 2020-01-11 DIAGNOSIS — M109 Gout, unspecified: Secondary | ICD-10-CM | POA: Diagnosis present

## 2020-01-11 LAB — CBC WITH DIFFERENTIAL/PLATELET
Abs Immature Granulocytes: 1.03 10*3/uL — ABNORMAL HIGH (ref 0.00–0.07)
Abs Immature Granulocytes: 2.18 10*3/uL — ABNORMAL HIGH (ref 0.00–0.07)
Basophils Absolute: 0.1 10*3/uL (ref 0.0–0.1)
Basophils Absolute: 0.1 10*3/uL (ref 0.0–0.1)
Basophils Relative: 0 %
Basophils Relative: 0 %
Eosinophils Absolute: 0.1 10*3/uL (ref 0.0–0.5)
Eosinophils Absolute: 0 10*3/uL (ref 0.0–0.5)
Eosinophils Relative: 0 %
Eosinophils Relative: 0 %
HCT: 33.5 % — ABNORMAL LOW (ref 39.0–52.0)
HCT: 28.7 % — ABNORMAL LOW (ref 39.0–52.0)
Hemoglobin: 10.5 g/dL — ABNORMAL LOW (ref 13.0–17.0)
Hemoglobin: 9 g/dL — ABNORMAL LOW (ref 13.0–17.0)
Immature Granulocytes: 4 %
Immature Granulocytes: 6 %
Lymphocytes Relative: 4 %
Lymphocytes Relative: 3 %
Lymphs Abs: 0.9 10*3/uL (ref 0.7–4.0)
Lymphs Abs: 1 10*3/uL (ref 0.7–4.0)
MCH: 24 pg — ABNORMAL LOW (ref 26.0–34.0)
MCH: 23.6 pg — ABNORMAL LOW (ref 26.0–34.0)
MCHC: 31.3 g/dL (ref 30.0–36.0)
MCHC: 31.4 g/dL (ref 30.0–36.0)
MCV: 76.7 fL — ABNORMAL LOW (ref 80.0–100.0)
MCV: 75.3 fL — ABNORMAL LOW (ref 80.0–100.0)
Monocytes Absolute: 1.2 10*3/uL — ABNORMAL HIGH (ref 0.1–1.0)
Monocytes Absolute: 1.5 10*3/uL — ABNORMAL HIGH (ref 0.1–1.0)
Monocytes Relative: 5 %
Monocytes Relative: 4 %
Neutro Abs: 20.5 10*3/uL — ABNORMAL HIGH (ref 1.7–7.7)
Neutro Abs: 29.8 10*3/uL — ABNORMAL HIGH (ref 1.7–7.7)
Neutrophils Relative %: 87 %
Neutrophils Relative %: 87 %
Platelets: 383 10*3/uL (ref 150–400)
Platelets: 303 10*3/uL (ref 150–400)
RBC: 4.37 MIL/uL (ref 4.22–5.81)
RBC: 3.81 MIL/uL — ABNORMAL LOW (ref 4.22–5.81)
RDW: 18.4 % — ABNORMAL HIGH (ref 11.5–15.5)
RDW: 17.9 % — ABNORMAL HIGH (ref 11.5–15.5)
WBC: 23.7 10*3/uL — ABNORMAL HIGH (ref 4.0–10.5)
WBC: 34.5 10*3/uL — ABNORMAL HIGH (ref 4.0–10.5)
nRBC: 0.1 % (ref 0.0–0.2)
nRBC: 0.1 % (ref 0.0–0.2)

## 2020-01-11 LAB — RESPIRATORY PANEL BY RT PCR (FLU A&B, COVID)
Influenza A by PCR: NEGATIVE
Influenza B by PCR: NEGATIVE
SARS Coronavirus 2 by RT PCR: POSITIVE — AB

## 2020-01-11 LAB — BASIC METABOLIC PANEL
Anion gap: 18 — ABNORMAL HIGH (ref 5–15)
BUN: 57 mg/dL — ABNORMAL HIGH (ref 8–23)
CO2: 22 mmol/L (ref 22–32)
Calcium: 8.5 mg/dL — ABNORMAL LOW (ref 8.9–10.3)
Chloride: 90 mmol/L — ABNORMAL LOW (ref 98–111)
Creatinine, Ser: 16.89 mg/dL — ABNORMAL HIGH (ref 0.61–1.24)
GFR calc Af Amer: 3 mL/min — ABNORMAL LOW (ref >60)
GFR calc non Af Amer: 3 mL/min — ABNORMAL LOW (ref >60)
Glucose, Bld: 135 mg/dL — ABNORMAL HIGH (ref 70–99)
Potassium: 5.3 mmol/L — ABNORMAL HIGH (ref 3.5–5.1)
Sodium: 130 mmol/L — ABNORMAL LOW (ref 135–145)

## 2020-01-11 LAB — LACTIC ACID, PLASMA
Lactic Acid, Venous: 2.2 mmol/L (ref 0.5–1.9)
Lactic Acid, Venous: 1.9 mmol/L (ref 0.5–1.9)

## 2020-01-11 LAB — COMPREHENSIVE METABOLIC PANEL
ALT: 10 U/L (ref 0–44)
AST: 15 U/L (ref 15–41)
Albumin: 3.3 g/dL — ABNORMAL LOW (ref 3.5–5.0)
Alkaline Phosphatase: 91 U/L (ref 38–126)
Anion gap: 21 — ABNORMAL HIGH (ref 5–15)
BUN: 83 mg/dL — ABNORMAL HIGH (ref 8–23)
CO2: 16 mmol/L — ABNORMAL LOW (ref 22–32)
Calcium: 9.1 mg/dL (ref 8.9–10.3)
Chloride: 91 mmol/L — ABNORMAL LOW (ref 98–111)
Creatinine, Ser: 22.62 mg/dL — ABNORMAL HIGH (ref 0.61–1.24)
GFR calc Af Amer: 2 mL/min — ABNORMAL LOW (ref >60)
GFR calc non Af Amer: 2 mL/min — ABNORMAL LOW (ref >60)
Glucose, Bld: 120 mg/dL — ABNORMAL HIGH (ref 70–99)
Potassium: 6.1 mmol/L — ABNORMAL HIGH (ref 3.5–5.1)
Sodium: 128 mmol/L — ABNORMAL LOW (ref 135–145)
Total Bilirubin: 0.9 mg/dL (ref 0.3–1.2)
Total Protein: 8.8 g/dL — ABNORMAL HIGH (ref 6.5–8.1)

## 2020-01-11 LAB — D-DIMER, QUANTITATIVE: D-Dimer, Quant: 10.25 ug/mL-FEU — ABNORMAL HIGH (ref 0.00–0.50)

## 2020-01-11 LAB — HEPATIC FUNCTION PANEL
ALT: 10 U/L (ref 0–44)
AST: 18 U/L (ref 15–41)
Albumin: 3.2 g/dL — ABNORMAL LOW (ref 3.5–5.0)
Alkaline Phosphatase: 84 U/L (ref 38–126)
Bilirubin, Direct: 0.3 mg/dL — ABNORMAL HIGH (ref 0.0–0.2)
Indirect Bilirubin: 0.3 mg/dL (ref 0.3–0.9)
Total Bilirubin: 0.6 mg/dL (ref 0.3–1.2)
Total Protein: 8.1 g/dL (ref 6.5–8.1)

## 2020-01-11 LAB — FIBRINOGEN: Fibrinogen: >800 mg/dL — ABNORMAL HIGH (ref 210–475)

## 2020-01-11 LAB — PROCALCITONIN: Procalcitonin: 22.59 ng/mL

## 2020-01-11 LAB — TRIGLYCERIDES: Triglycerides: 235 mg/dL — ABNORMAL HIGH (ref <150)

## 2020-01-11 LAB — LACTATE DEHYDROGENASE: LDH: 394 U/L — ABNORMAL HIGH (ref 98–192)

## 2020-01-11 LAB — C-REACTIVE PROTEIN: CRP: 34.4 mg/dL — ABNORMAL HIGH (ref <1.0)

## 2020-01-11 LAB — BRAIN NATRIURETIC PEPTIDE: B Natriuretic Peptide: 103.8 pg/mL — ABNORMAL HIGH (ref 0.0–100.0)

## 2020-01-11 LAB — FERRITIN: Ferritin: 1284 ng/mL — ABNORMAL HIGH (ref 24–336)

## 2020-01-11 MED ORDER — SODIUM CHLORIDE 0.9 % IV SOLN
200.0000 mg | Freq: Once | INTRAVENOUS | Status: AC
  Administered 2020-01-11: 200 mg via INTRAVENOUS
  Filled 2020-01-11: qty 40

## 2020-01-11 MED ORDER — SODIUM CHLORIDE 0.9 % IV SOLN
1.0000 g | Freq: Every day | INTRAVENOUS | Status: DC
  Administered 2020-01-12: 1 g via INTRAVENOUS
  Filled 2020-01-11: qty 10

## 2020-01-11 MED ORDER — PANTOPRAZOLE SODIUM 40 MG PO TBEC
40.0000 mg | DELAYED_RELEASE_TABLET | Freq: Every day | ORAL | Status: DC
  Administered 2020-01-12 – 2020-01-21 (×10): 40 mg via ORAL
  Filled 2020-01-11 (×10): qty 1

## 2020-01-11 MED ORDER — SODIUM CHLORIDE 0.9 % IV SOLN
500.0000 mg | INTRAVENOUS | Status: DC
  Administered 2020-01-11: 500 mg via INTRAVENOUS
  Filled 2020-01-11 (×2): qty 500

## 2020-01-11 MED ORDER — ALLOPURINOL 100 MG PO TABS
100.0000 mg | ORAL_TABLET | Freq: Two times a day (BID) | ORAL | Status: DC
  Administered 2020-01-12 – 2020-01-21 (×20): 100 mg via ORAL
  Filled 2020-01-11 (×22): qty 1

## 2020-01-11 MED ORDER — DEXAMETHASONE SODIUM PHOSPHATE 10 MG/ML IJ SOLN
6.0000 mg | INTRAMUSCULAR | Status: DC
  Administered 2020-01-11 – 2020-01-17 (×6): 6 mg via INTRAVENOUS
  Filled 2020-01-11 (×6): qty 1

## 2020-01-11 MED ORDER — SODIUM CHLORIDE 0.9 % IV SOLN: 100.0000 mg | INTRAVENOUS | Status: DC

## 2020-01-11 MED ORDER — SODIUM CHLORIDE 0.9 % IV SOLN
25.0000 mg | Freq: Once | INTRAVENOUS | Status: AC
  Administered 2020-01-12: 25 mg via INTRAVENOUS
  Filled 2020-01-11: qty 1

## 2020-01-11 MED ORDER — DIPHENHYDRAMINE HCL 25 MG PO CAPS
25.0000 mg | ORAL_CAPSULE | ORAL | Status: DC
  Administered 2020-01-14 – 2020-01-21 (×4): 25 mg via ORAL
  Filled 2020-01-11 (×5): qty 1

## 2020-01-11 MED ORDER — ALBUTEROL SULFATE HFA 108 (90 BASE) MCG/ACT IN AERS: 2.0000 | INHALATION_SPRAY | RESPIRATORY_TRACT | Status: DC | PRN

## 2020-01-11 MED ORDER — ALBUTEROL SULFATE HFA 108 (90 BASE) MCG/ACT IN AERS
4.0000 | INHALATION_SPRAY | Freq: Once | RESPIRATORY_TRACT | Status: AC
  Administered 2020-01-11: 4 via RESPIRATORY_TRACT
  Filled 2020-01-11: qty 6.7

## 2020-01-11 MED ORDER — ACETAMINOPHEN 325 MG PO TABS
650.0000 mg | ORAL_TABLET | Freq: Four times a day (QID) | ORAL | Status: DC | PRN
  Administered 2020-01-19: 650 mg via ORAL
  Filled 2020-01-11: qty 2

## 2020-01-11 MED ORDER — SODIUM CHLORIDE 0.9 % IV SOLN
100.0000 mg | Freq: Every day | INTRAVENOUS | Status: AC
  Administered 2020-01-12 – 2020-01-15 (×4): 100 mg via INTRAVENOUS
  Filled 2020-01-11 (×4): qty 20

## 2020-01-11 MED ORDER — CINACALCET HCL 30 MG PO TABS
90.0000 mg | ORAL_TABLET | ORAL | Status: DC
  Administered 2020-01-14 – 2020-01-21 (×4): 90 mg via ORAL
  Filled 2020-01-11 (×7): qty 3

## 2020-01-11 MED ORDER — AMLODIPINE BESYLATE 5 MG PO TABS
5.0000 mg | ORAL_TABLET | Freq: Every day | ORAL | Status: DC
  Administered 2020-01-13 – 2020-01-16 (×4): 5 mg via ORAL
  Filled 2020-01-11 (×8): qty 1

## 2020-01-11 MED ORDER — MIRTAZAPINE 15 MG PO TABS
15.0000 mg | ORAL_TABLET | Freq: Every evening | ORAL | Status: DC | PRN
  Administered 2020-01-21: 15 mg via ORAL
  Filled 2020-01-11 (×2): qty 1

## 2020-01-11 MED ORDER — PRAVASTATIN SODIUM 10 MG PO TABS
20.0000 mg | ORAL_TABLET | Freq: Every day | ORAL | Status: DC
  Administered 2020-01-12 – 2020-01-21 (×10): 20 mg via ORAL
  Filled 2020-01-11 (×10): qty 2

## 2020-01-11 MED ORDER — ALBUTEROL SULFATE HFA 108 (90 BASE) MCG/ACT IN AERS
4.0000 | INHALATION_SPRAY | Freq: Once | RESPIRATORY_TRACT | Status: AC
  Administered 2020-01-11: 4 via RESPIRATORY_TRACT

## 2020-01-11 MED ORDER — INSULIN ASPART 100 UNIT/ML ~~LOC~~ SOLN
0.0000 [IU] | Freq: Three times a day (TID) | SUBCUTANEOUS | Status: DC
  Administered 2020-01-12: 2 [IU] via SUBCUTANEOUS
  Administered 2020-01-12: 5 [IU] via SUBCUTANEOUS
  Administered 2020-01-13: 2 [IU] via SUBCUTANEOUS
  Administered 2020-01-13: 8 [IU] via SUBCUTANEOUS
  Administered 2020-01-13: 2 [IU] via SUBCUTANEOUS
  Administered 2020-01-14: 3 [IU] via SUBCUTANEOUS
  Administered 2020-01-14: 2 [IU] via SUBCUTANEOUS
  Administered 2020-01-15 (×3): 5 [IU] via SUBCUTANEOUS
  Administered 2020-01-16: 3 [IU] via SUBCUTANEOUS
  Administered 2020-01-16: 11 [IU] via SUBCUTANEOUS
  Administered 2020-01-17: 5 [IU] via SUBCUTANEOUS
  Administered 2020-01-17: 3 [IU] via SUBCUTANEOUS
  Administered 2020-01-17 – 2020-01-18 (×2): 5 [IU] via SUBCUTANEOUS
  Administered 2020-01-19 (×2): 3 [IU] via SUBCUTANEOUS
  Administered 2020-01-20: 5 [IU] via SUBCUTANEOUS
  Administered 2020-01-20 (×2): 2 [IU] via SUBCUTANEOUS
  Administered 2020-01-21: 3 [IU] via SUBCUTANEOUS
  Filled 2020-01-11: qty 0.15

## 2020-01-11 MED ORDER — AEROCHAMBER Z-STAT PLUS/MEDIUM MISC
1.0000 | Freq: Once | Status: AC
  Administered 2020-01-11: 1
  Filled 2020-01-11: qty 1

## 2020-01-11 MED ORDER — ALBUTEROL SULFATE HFA 108 (90 BASE) MCG/ACT IN AERS
6.0000 | INHALATION_SPRAY | Freq: Once | RESPIRATORY_TRACT | Status: AC
  Administered 2020-01-11: 6 via RESPIRATORY_TRACT
  Filled 2020-01-11: qty 6.7

## 2020-01-11 MED ORDER — CARVEDILOL 6.25 MG PO TABS
6.2500 mg | ORAL_TABLET | Freq: Two times a day (BID) | ORAL | Status: DC
  Administered 2020-01-13 – 2020-01-19 (×11): 6.25 mg via ORAL
  Filled 2020-01-11 (×17): qty 1

## 2020-01-11 MED ORDER — IPRATROPIUM BROMIDE HFA 17 MCG/ACT IN AERS
2.0000 | INHALATION_SPRAY | Freq: Once | RESPIRATORY_TRACT | Status: AC
  Administered 2020-01-11: 2 via RESPIRATORY_TRACT
  Filled 2020-01-11: qty 12.9

## 2020-01-11 MED ORDER — FERRIC CITRATE 1 GM 210 MG(FE) PO TABS
420.0000 mg | ORAL_TABLET | Freq: Two times a day (BID) | ORAL | Status: DC
  Administered 2020-01-12 – 2020-01-16 (×9): 420 mg via ORAL
  Filled 2020-01-11 (×9): qty 2

## 2020-01-11 MED ORDER — FUROSEMIDE 40 MG PO TABS
40.0000 mg | ORAL_TABLET | Freq: Every day | ORAL | Status: DC
  Administered 2020-01-12 – 2020-01-18 (×7): 40 mg via ORAL
  Filled 2020-01-11 (×9): qty 1

## 2020-01-11 MED ORDER — VANCOMYCIN HCL 10 G IV SOLR
2500.0000 mg | Freq: Once | INTRAVENOUS | Status: AC
  Administered 2020-01-11: 2500 mg via INTRAVENOUS
  Filled 2020-01-11: qty 2500

## 2020-01-11 MED ORDER — ONDANSETRON HCL 4 MG PO TABS
4.0000 mg | ORAL_TABLET | Freq: Three times a day (TID) | ORAL | Status: DC | PRN
  Filled 2020-01-11: qty 1

## 2020-01-11 MED ORDER — HEPARIN SODIUM (PORCINE) 5000 UNIT/ML IJ SOLN
5000.0000 [IU] | Freq: Three times a day (TID) | INTRAMUSCULAR | Status: DC
  Administered 2020-01-12: 5000 [IU] via SUBCUTANEOUS
  Filled 2020-01-11: qty 1

## 2020-01-11 MED ORDER — SODIUM CHLORIDE 0.9 % IV SOLN
1.0000 g | Freq: Once | INTRAVENOUS | Status: AC
  Administered 2020-01-11: 1 g via INTRAVENOUS
  Filled 2020-01-11: qty 1

## 2020-01-11 NOTE — ED Provider Notes (Signed)
[facility] DEPT Provider Note  CSN: [id number] Arrival date & time: 01/11/20 3875  Chief Complaint(s) Shortness of Breath  HPI [name] is a 64 y.o. male with a past medical history listed below including ESRD on dialysis Monday Wednesday Friday who presents to the emergency department with 1 week of shortness of breath.  Patient reports that he has dialysis on Monday but was under his dry weight.  He missed his Wednesday session.  Reports that he began having some dry cough earlier today and having hiccups.  No chest pain.  He is endorsing orthopnea.  No nausea vomiting.  No abdominal pain.  No worsening peripheral edema.  No fevers or chills.  Reports that he has had similar episodes in the past that improved with breathing treatment.  Of note patient was tested for COVID-19 on February 23 and was negative.  HPI  Past Medical History Past Medical History:  Diagnosis Date  . Alcohol abuse   . Congestive heart disease ([place]) 12/2011  . Diabetes mellitus   . GERD (gastroesophageal reflux disease)   . Gout   . Headache   . Hepatitis    being treated for Hepatitis C  . HOH (hard of hearing)   . Hyperlipidemia   . Hyperlipidemia   . Hypertension   . Monoclonal gammopathies 02/25/2012  . Obesity   . Pedal edema   . Pneumonia   . Renal insufficiency   . Sleep apnea    uses cpap  . Tobacco abuse   . Wears hearing aid    b/l   Patient Active Problem List   Diagnosis Date Noted  . Effusion of lower leg joint 07/13/2018  . Absolute anemia 07/13/2018  . Benign essential HTN 07/13/2018  . Carpal tunnel syndrome of right wrist 05/19/2017  . Hand pain, right 05/10/2017  . Gram-neg septicemia ([place])   . Scrotal edema   . ESRD (end stage renal disease) on dialysis ([place])   . Scrotal pain   . Sepsis ([place])   . Acute respiratory failure with hypoxia ([place]) 03/04/2017  . Scrotal swelling 03/04/2017  . Chronic hepatitis C without hepatic coma ([place])  11/17/2016  . Pulmonary nodule 04/02/2014  . Sleep apnea 04/02/2014  . Allergic rhinitis 04/02/2014  . End stage renal disease ([place]) 02/13/2014  . Diabetes mellitus type 2, uncontrolled ([place]) 01/21/2014  . PNA (pneumonia) 01/21/2014  . Pedal edema   . Monoclonal gammopathies 02/25/2012  . Obesity 01/10/2012  . Chronic kidney disease 01/10/2012  . Diabetes mellitus ([place]) 01/10/2012  . Hypertension   . Hyperlipidemia   . Gout   . Alcohol abuse   . Tobacco abuse    Home Medication(s) Prior to Admission medications   Medication Sig Start Date End Date Taking? Authorizing Provider  acetaminophen (TYLENOL) 325 MG tablet Take 650 mg by mouth every 6 (six) hours as needed for mild pain, moderate pain, fever or headache.     [provider]  allopurinol (ZYLOPRIM) 100 MG tablet Take 1 tablet (100 mg total) by mouth daily. Patient taking differently: Take 100 mg by mouth 2 (two) times daily.  03/14/17   Rai, Ripudeep K, MD  amLODipine (NORVASC) 5 MG tablet Take 5 mg by mouth daily.    [provider]  AURYXIA 1 GM 210 MG(Fe) tablet Take 420 mg by mouth 2 (two) times daily with a meal.  07/14/18   [provider]  carvedilol (COREG) 6.25 MG tablet Take 6.25 mg by mouth 2 (  two) times daily with a meal.    [provider]  cinacalcet (SENSIPAR) 90 MG tablet Take 90 mg by mouth every Monday, Wednesday, and Friday.     [provider]  clotrimazole (LOTRIMIN) 1 % cream Apply to both feet and between toes twice daily 10/19/18   [place], Stephani Police, DPM  COLCRYS 0.6 MG tablet Take 0.6 mg by mouth daily as needed (gout).  01/28/19   [provider]  diphenhydrAMINE (BENADRYL) 25 MG tablet Take 25 mg by mouth every Monday, Wednesday, and Friday.    [provider]  furosemide (LASIX) 40 MG tablet Take 40 mg by mouth daily.    [provider]  gabapentin (NEURONTIN) 100 MG capsule Take 100 mg by mouth 2 (two) times daily.     [provider]  heparin 1000 UNIT/ML injection Heparin Sodium (Porcine) 1,000 Units/mL Systemic 12/20/18 06/04/20  [provider]  HYDROcodone-acetaminophen (NORCO) 5-325 MG tablet Take 1 tablet by mouth every 6 (six) hours as needed for moderate pain. 08/02/19   Dagoberto Ligas, PA-C  Insulin Detemir (LEVEMIR FLEXTOUCH) 100 UNIT/ML Pen Inject 20 Units into the skin at bedtime.     [provider]  omeprazole (PRILOSEC) 20 MG capsule Take by mouth daily. 08/03/19   [provider]  pravastatin (PRAVACHOL) 20 MG tablet Take 20 mg by mouth daily.     [provider]                                                                                                                                    Past Surgical History Past Surgical History:  Procedure Laterality Date  . AV FISTULA PLACEMENT Right 01/10/2014   Procedure: ARTERIOVENOUS (AV) FISTULA CREATION- RIGHT RADIOCEPHALIC ;  Surgeon: Angelia Mould, MD;  Location: [facility];  Service: Vascular;  Laterality: Right;  . COLONOSCOPY    . EYE SURGERY     cataract surgery, bilateral  . Ganglion cyst removal x 2    . INSERTION OF DIALYSIS CATHETER N/A 01/07/2014   Procedure: INSERTION OF DIALYSIS CATHETER;  Surgeon: Conrad Pukwana, MD;  Location: [facility];  Service: Vascular;  Laterality: N/A;  . Left knee arthroscopy with generalized joint synovectomy,  01/2006  . LIGATION OF COMPETING BRANCHES OF ARTERIOVENOUS FISTULA Right 05/28/2014   Procedure: LIGATION OF COMPETING BRANCHES OF ARTERIOVENOUS FISTULA;  Surgeon: Angelia Mould, MD;  Location: [facility];  Service: Vascular;  Laterality: Right;  . REVISON OF ARTERIOVENOUS FISTULA Right 12/19/5571   Procedure: PLICATION REPAIR OF ARTERIOVENOUS FISTULA PSEUDOANEURYSM;  Surgeon: Serafina Mitchell, MD;  Location: [facility];  Service: Vascular;  Laterality: Right;  . REVISON OF ARTERIOVENOUS FISTULA Right 08/08/2018   Procedure: REVISION PLICATION OF ARTERIOVENOUS FISTULA FOREARM;   Surgeon: Angelia Mould, MD;  Location: [facility];  Service: Vascular;  Laterality: Right;  . REVISON OF ARTERIOVENOUS FISTULA Right 08/02/2019   Procedure: Artegraft placement  right arm arteriovenous graft;  Surgeon: Waynetta Sandy, MD;  Location: [facility];  Service: Vascular;  Laterality: Right;  . SCROTAL EXPLORATION N/A 03/11/2017   Procedure: SCROTUM EXPLORATION AND DEBRIDEMENT;  Surgeon: Franchot Gallo, MD;  Location: [facility];  Service: Urology;  Laterality: N/A;  . SCROTAL EXPLORATION N/A 03/21/2017   Procedure: REPEAT SCROTAL DEBRIDEMENT;  Surgeon: Franchot Gallo, MD;  Location: [facility];  Service: Urology;  Laterality: N/A;  . SHUNTOGRAM Right 05/14/2014   Procedure: FISTULOGRAM;  Surgeon: Serafina Mitchell, MD;  Location: [facility] CATH LAB;  Service: Cardiovascular;  Laterality: Right;  . THROMBECTOMY W/ EMBOLECTOMY Right 07/04/2017   Procedure: EXPLORATION AND THROMBECTOMY ARTERIOVENOUS FISTULA RIGHT ARM;  Surgeon: Rosetta Posner, MD;  Location: [facility];  Service: Vascular;  Laterality: Right;   Family History Family History  Problem Relation Age of Onset  . Diabetes Mother   . Heart disease Mother   . Lung cancer Father     Social History Social History   Tobacco Use  . Smoking status: Former Smoker    Packs/day: 0.30    Years: 33.00    Pack years: 9.90    Types: Cigarettes    Quit date: 07/16/2010    Years since quitting: 9.4  . Smokeless tobacco: Never Used  Substance Use Topics  . Alcohol use: No  . Drug use: No   Allergies Penicillin g, Penicillins, Propolis, and Vioxx [rofecoxib]  Review of Systems Review of Systems All other systems are reviewed and are negative for acute change except as noted in the HPI  Physical Exam Vital Signs  I have reviewed the triage vital signs BP 124/82 (BP Location: Left Arm)   Pulse 100   Temp 99.2 F (37.3 C) (Oral)   Resp (!) 23   Ht 6\' 1"  (1.854 m)   Wt 128 kg   SpO2 92%   BMI 37.23 kg/m   Physical Exam Vitals  reviewed.  Constitutional:      General: He is not in acute distress.    Appearance: He is well-developed. He is not diaphoretic.  HENT:     Head: Normocephalic and atraumatic.     Nose: Nose normal.  Eyes:     General: No scleral icterus.       Right eye: No discharge.        Left eye: No discharge.     Conjunctiva/sclera: Conjunctivae normal.     Pupils: Pupils are equal, round, and reactive to light.  Cardiovascular:     Rate and Rhythm: Normal rate and regular rhythm.     Heart sounds: No murmur. No friction rub. No gallop.   Pulmonary:     Effort: Pulmonary effort is normal. Tachypnea (mild) present. No respiratory distress.     Breath sounds: Normal breath sounds. No stridor. No wheezing, rhonchi or rales.  Abdominal:     General: There is no distension.     Palpations: Abdomen is soft.     Tenderness: There is no abdominal tenderness.  Musculoskeletal:        General: No tenderness.     Cervical back: Normal range of motion and neck supple.  Skin:    General: Skin is warm and dry.     Findings: No erythema or rash.  Neurological:     Mental Status: He is alert and oriented to person, place, and time.     ED Results and Treatments Labs (all labs ordered are listed, but only abnormal results are displayed) Labs Reviewed  CBC WITH DIFFERENTIAL/PLATELET - Abnormal; Notable for the following components:      Result Value   WBC 23.7 (*)    Hemoglobin 10.5 (*)    HCT 33.5 (*)    MCV 76.7 (*)    MCH 24.0 (*)    RDW 18.4 (*)    Neutro Abs 20.5 (*)    Monocytes Absolute 1.2 (*)    Abs Immature Granulocytes 1.03 (*)    All other components within normal limits  COMPREHENSIVE METABOLIC PANEL - Abnormal; Notable for the following components:   Sodium 128 (*)    Potassium 6.1 (*)    Chloride 91 (*)    CO2 16 (*)    Glucose, Bld 120 (*)    BUN 83 (*)    Creatinine, Ser 22.62 (*)    Total Protein 8.8 (*)    Albumin 3.3 (*)    GFR calc non Af Amer 2 (*)    GFR calc  Af Amer 2 (*)    Anion gap 21 (*)    All other components within normal limits                                                                                                                         EKG  EKG Interpretation  Date/Time:  Friday January 11 2020 05:04:01 EST Ventricular Rate:  100 PR Interval:    QRS Duration: 87 QT Interval:  320 QTC Calculation: 413 R Axis:   -35 Text Interpretation: Sinus tachycardia Inferior infarct, old Consider anterior infarct No significant change since last tracing Confirmed by Addison Lank [id number]) on 01/11/2020 5:15:19 AM      Radiology DG Chest Port 1 View  Result Date: 01/11/2020 CLINICAL DATA:  Cough, short of breath EXAM: PORTABLE CHEST 1 VIEW COMPARISON:  03/07/2017 FINDINGS: Single frontal view of the chest demonstrates a left internal jugular dialysis catheter tip overlying atrial caval junction. Vascular stent overlies the right axilla. Cardiac silhouette is stable. There is a 7.3 cm mass projecting over the right anterior first and second ribs. CT chest with IV contrast recommended for suspected neoplasm. There is central vascular congestion with diffuse interstitial prominence consistent with mild fluid overload. Right hilar prominence could also reflect lymphadenopathy. No effusion or pneumothorax. No acute bony abnormality. IMPRESSION: 1. 7.3 cm right upper lobe mass consistent with neoplasm. CT chest with IV contrast is recommended. 2. Right hilar prominence could reflect underlying adenopathy. 3. Vascular congestion and interstitial edema. Electronically Signed   By: Randa Ngo M.D.   On: 01/11/2020 04:02    Pertinent labs & imaging results that were available during my care of the patient were reviewed by me and considered in my medical decision making (see chart for details).  Medications Ordered in ED Medications  albuterol (VENTOLIN HFA) 108 (90 Base) MCG/ACT inhaler 4 puff (has no administration in time range)  ipratropium  (ATROVENT HFA) inhaler 2 puff (2 puffs Inhalation Given 01/11/20 0406)  aerochamber Z-Stat Plus/medium 1  each (1 each Other Given 01/11/20 0403)  albuterol (VENTOLIN HFA) 108 (90 Base) MCG/ACT inhaler 6 puff (6 puffs Inhalation Given 01/11/20 0403)                                                                                                                                    Procedures Procedures  (including critical care time)  Medical Decision Making / ED Course I have reviewed the nursing notes for this encounter and the patient's prior records (if available in EHR or on provided paperwork).   [name] was evaluated in Emergency Department on 01/11/2020 for the symptoms described in the history of present illness. He was evaluated in the context of the global COVID-19 pandemic, which necessitated consideration that the patient might be at risk for infection with the SARS-CoV-2 virus that causes COVID-19. Institutional protocols and algorithms that pertain to the evaluation of patients at risk for COVID-19 are in a state of rapid change based on information released by regulatory bodies including the CDC and federal and state organizations. These policies and algorithms were followed during the patient's care in the ED.  Patient presents with shortness of breath. Patient is mildly tachypneic but satting well on room air.  Lungs clear to auscultation bilaterally.  No evidence of significant volume overload on exam.  Patient provided with albuterol inhaler which improved his work of breathing. Labs notable for mild hyperkalemia.  No EKG changes. Chest x-ray with evidence of mild pulmonary edema vascular congestion.  Also noted large right upper lobe mass concerning for neoplasm.  Patient made aware of this finding. Given his significant improvement in work of breathing.  Recommended he go straight to his morning dialysis session.  Also recommended strongly that he make an appointment with  his primary care provider and[facility]or [facility] to [facility] this [facility].     Final Clinical Impression(s) / ED Diagnoses Final diagnoses:  Cough  Other hypervolemia  Mass of upper lobe of right lung    The patient appears reasonably screened and/or stabilized for discharge and I doubt any other medical condition or other [facility] requiring further screening, evaluation, or treatment in the ED at this time prior to discharge. Safe for discharge with strict return precautions.  Disposition: Discharge  Condition: Good  I have discussed the results, Dx and Tx plan with the patient/family who expressed understanding and agree(s) with the plan. Discharge instructions discussed at length. The patient/family was given strict return precautions who verbalized understanding of the instructions. No further questions at time of discharge.    ED Discharge Orders    None       Follow Up: [name], MD [place] [id number] [phone]  Schedule an appointment as soon as possible for a visit  you will need to make an appointment for your PCP and/or [facility] to have further work-up regarding your right lung mass.  This chart was dictated using voice recognition software.  Despite best efforts to proofread,  errors can occur which can change the documentation meaning.   Fatima Blank, MD 01/11/20 [phone]

## 2020-01-11 NOTE — H&P (Addendum)
History and Physical    [name] GHW:[id number] DOB: 1956-01-02 DOA: 01/11/2020  PCP: [name], MD  Patient coming from: Home, lives with wife  I have personally briefly reviewed patient's old medical records in [place]  Chief Complaint: "not Feeling well"  HPI: [name] is a 64 y.o. male with medical history significant for  HTN, OSA, chronic Hep C, Type 2 diabetes and ESRD on HD MW/F who presents with concerns of "not feeling well."  He has been having symptoms of cough, chills and sneezing for the past week.  Also has right-sided chest pain associated and worse with coughing.  Denies any fever.  Has decreased appetite and vomited once.  No diarrhea abdominal pain.  Denies any shortness of breath.  Denies any recent sick contacts.  He was just evaluated earlier in the ED this morning for shortness of breath thought possibly due to fluid overload since he missed dialysis on Wednesday. He also had CXR findings concerning for a right upper lobe lung mass and [facility]. He completed dialysis this morning but continued to feel poorly and presented to ED for re-evaluation.  In the ED, He had temperature of 99.61F, tachycardia and tachypneic requiring up to 3L Leukocytosis of 34.5K, hemoglobin of 8 which is down from normal 5 months ago. Na of 130, K of 5.3, glucose of 135 and creatinine of 16.89 which is mildly elevated from baseline of around 14. Anion gap of 18. COVID PCR positive.   He was started on Vanc and cefepime for concerns of superimposed bacteria pneumonia. CXR shows improvement to the right upper lobe lesion that could possibly reflect prominent pulmonary vasculature vs airspace disease.   Review of Systems: Constitutional: No Weight Change, No Fever ENT/Mouth: No sore throat, No Rhinorrhea Eyes: No Eye Pain, No Vision Changes Cardiovascular: + Chest Pain, no SOB Respiratory: + Cough, No Sputum, No Wheezing, no Dyspnea   Gastrointestinal: No Nausea, + Vomiting, No Diarrhea, No Constipation, No Pain Genitourinary: no Urinary Incontinence Musculoskeletal: No Arthralgias, No Myalgias Skin: No Skin Lesions, No Pruritus, Neuro: no Weakness, No Numbness,  No Loss of Consciousness, No Syncope Psych: No Anxiety/Panic, No Depression, no decrease appetite Heme/Lymph: No Bruising, No Bleeding   Past Medical History:  Diagnosis Date  . Alcohol abuse   . Congestive heart disease ([place]) 12/2011  . Diabetes mellitus   . GERD (gastroesophageal reflux disease)   . Gout   . Headache   . Hepatitis    being treated for Hepatitis C  . HOH (hard of hearing)   . Hyperlipidemia   . Hyperlipidemia   . Hypertension   . Monoclonal gammopathies 02/25/2012  . Obesity   . Pedal edema   . Pneumonia   . Renal insufficiency   . Sleep apnea    uses cpap  . Tobacco abuse   . Wears hearing aid    b/l    Past Surgical History:  Procedure Laterality Date  . AV FISTULA PLACEMENT Right 01/10/2014   Procedure: ARTERIOVENOUS (AV) FISTULA CREATION- RIGHT RADIOCEPHALIC ;  Surgeon: Angelia Mould, MD;  Location: [place];  Service: Vascular;  Laterality: Right;  . COLONOSCOPY    . EYE SURGERY     cataract surgery, bilateral  . Ganglion cyst removal x 2    . INSERTION OF DIALYSIS CATHETER N/A 01/07/2014   Procedure: INSERTION OF DIALYSIS CATHETER;  Surgeon: Conrad Bellingham, MD;  Location: [place];  Service: Vascular;  Laterality: N/A;  . Left knee arthroscopy with generalized joint synovectomy,  01/2006  . LIGATION OF COMPETING BRANCHES OF ARTERIOVENOUS FISTULA Right 05/28/2014   Procedure: LIGATION OF COMPETING BRANCHES OF ARTERIOVENOUS FISTULA;  Surgeon: Angelia Mould, MD;  Location: [place];  Service: Vascular;  Laterality: Right;  . REVISON OF ARTERIOVENOUS FISTULA Right 03/20/3874   Procedure: PLICATION REPAIR OF ARTERIOVENOUS FISTULA PSEUDOANEURYSM;  Surgeon: Serafina Mitchell, MD;  Location: [place];  Service: Vascular;   Laterality: Right;  . REVISON OF ARTERIOVENOUS FISTULA Right 08/08/2018   Procedure: REVISION PLICATION OF ARTERIOVENOUS FISTULA FOREARM;  Surgeon: Angelia Mould, MD;  Location: [place];  Service: Vascular;  Laterality: Right;  . REVISON OF ARTERIOVENOUS FISTULA Right 08/02/2019   Procedure: Artegraft placement right arm arteriovenous graft;  Surgeon: Waynetta Sandy, MD;  Location: [place];  Service: Vascular;  Laterality: Right;  . SCROTAL EXPLORATION N/A 03/11/2017   Procedure: SCROTUM EXPLORATION AND DEBRIDEMENT;  Surgeon: Franchot Gallo, MD;  Location: [place];  Service: Urology;  Laterality: N/A;  . SCROTAL EXPLORATION N/A 03/21/2017   Procedure: REPEAT SCROTAL DEBRIDEMENT;  Surgeon: Franchot Gallo, MD;  Location: [place];  Service: Urology;  Laterality: N/A;  . SHUNTOGRAM Right 05/14/2014   Procedure: FISTULOGRAM;  Surgeon: Serafina Mitchell, MD;  Location[facility] CATH LAB;  Service: Cardiovascular;  Laterality: Right;  . THROMBECTOMY W/ EMBOLECTOMY Right 07/04/2017   Procedure: EXPLORATION AND THROMBECTOMY ARTERIOVENOUS FISTULA RIGHT ARM;  Surgeon: Rosetta Posner, MD;  Location: [place];  Service: Vascular;  Laterality: Right;     reports that he quit smoking about 9 years ago. His smoking use included cigarettes. He has a 9.90 pack-year smoking history. He has never used smokeless tobacco. He reports that he does not drink alcohol or use drugs.  Allergies  Allergen Reactions  . Penicillin G   . Penicillins Nausea And Vomiting and Other (See Comments)    Has patient had a PCN reaction causing immediate rash, facial/tongue/throat swelling, SOB or lightheadedness with hypotension: No Has patient had a PCN reaction causing severe rash involving mucus membranes or skin necrosis: No Has patient had a PCN reaction that required hospitalization No Has patient had a PCN reaction occurring within the last 10 years: No If all of the above answers are "NO", then may proceed with Cephalosporin  use.  Marland Kitchen Propolis Nausea And Vomiting  . Vioxx [Rofecoxib] Nausea And Vomiting    Family History  Problem Relation Age of Onset  . Diabetes Mother   . Heart disease Mother   . Lung cancer Father      Prior to Admission medications   Medication Sig Start Date End Date Taking? Authorizing Provider  acetaminophen (TYLENOL) 325 MG tablet Take 650 mg by mouth every 6 (six) hours as needed for mild pain, moderate pain, fever or headache.     [provider]  allopurinol (ZYLOPRIM) 100 MG tablet Take 1 tablet (100 mg total) by mouth daily. Patient taking differently: Take 100 mg by mouth 2 (two) times daily.  03/14/17   Rai, Ripudeep K, MD  amLODipine (NORVASC) 5 MG tablet Take 5 mg by mouth daily.    [provider]  AURYXIA 1 GM 210 MG(Fe) tablet Take 420 mg by mouth 2 (two) times daily with a meal.  07/14/18   [provider]  carvedilol (COREG) 6.25 MG tablet Take 6.25 mg by mouth 2 (two) times daily with a meal.    [provider]  cinacalcet (SENSIPAR) 90 MG  tablet Take 90 mg by mouth every Monday, Wednesday, and Friday.     [provider]  clotrimazole (LOTRIMIN) 1 % cream Apply to both feet and between toes twice daily 10/19/18   [place], Stephani Police, DPM  COLCRYS 0.6 MG tablet Take 0.6 mg by mouth daily as needed (gout).  01/28/19   [provider]  diphenhydrAMINE (BENADRYL) 25 MG tablet Take 25 mg by mouth every Monday, Wednesday, and Friday.    [provider]  furosemide (LASIX) 40 MG tablet Take 40 mg by mouth daily.    [provider]  gabapentin (NEURONTIN) 100 MG capsule Take 100 mg by mouth 2 (two) times daily.     [provider]  heparin 1000 UNIT/ML injection Heparin Sodium (Porcine) 1,000 Units/mL Systemic 12/20/18 06/04/20  [provider]  HYDROcodone-acetaminophen (NORCO) 5-325 MG tablet Take 1 tablet by mouth every 6 (six) hours as needed for moderate pain. 08/02/19   Dagoberto Ligas, PA-C   Insulin Detemir (LEVEMIR FLEXTOUCH) 100 UNIT/ML Pen Inject 20 Units into the skin at bedtime.     [provider]  omeprazole (PRILOSEC) 20 MG capsule Take by mouth daily. 08/03/19   [provider]  pravastatin (PRAVACHOL) 20 MG tablet Take 20 mg by mouth daily.     [provider]    Physical Exam: Vitals:   01/11/20 1727 01/11/20 1730 01/11/20 1800 01/11/20 1830  BP:  (!) 142/86 132/69 108/73  Pulse: 91 91 92 90  Resp:  (!) 30 (!) 38 (!) 34  Temp:      TempSrc:      SpO2: 94% 95% 96% 94%  Weight:      Height:        Constitutional: NAD, calm, comfortable, mildly ill-appearing morbidly obese male laying flat in bed Vitals:   01/11/20 1727 01/11/20 1730 01/11/20 1800 01/11/20 1830  BP:  (!) 142/86 132/69 108/73  Pulse: 91 91 92 90  Resp:  (!) 30 (!) 38 (!) 34  Temp:      TempSrc:      SpO2: 94% 95% 96% 94%  Weight:      Height:       Eyes: PERRL, lids and conjunctivae normal ENMT: Mucous membranes are moist.  Neck: normal, supple, no masses, no thyromegaly Respiratory: clear to auscultation bilaterally, no wheezing, no crackles.  Tachypneic on 3 L via nasal cannula.  No accessory muscle use.  Cardiovascular: Tachycardic with continuous hiccups, no murmurs / rubs / gallops. No extremity edema.  Left sided dialysis port in place.  Abdomen: no tenderness, no masses palpated. Bowel sounds positive.  Musculoskeletal: no clubbing / cyanosis. No joint deformity upper and lower extremities. Good ROM, no contractures. Normal muscle tone.  Left lower extremity wrapped in Ace bandages. Skin: no rashes, lesions, ulcers. No induration Neurologic: CN 2-12 grossly intact. Sensation intact. Strength 5/5 in all 4.  Psychiatric: Normal judgment and insight. Alert and oriented x 3. Normal mood.    Labs on Admission: I have personally reviewed following labs and imaging studies  CBC: Recent Labs  Lab 01/11/20 0333 01/11/20 1610  WBC 23.7* 34.5*  NEUTROABS  20.5* 29.8*  HGB 10.5* 9.0*  HCT 33.5* 28.7*  MCV 76.7* 75.3*  PLT 383 710   Basic Metabolic Panel: Recent Labs  Lab 01/11/20 0333 01/11/20 1610  NA 128* 130*  K 6.1* 5.3*  CL 91* 90*  CO2 16* 22  GLUCOSE 120* 135*  BUN 83* 57*  CREATININE 22.62* 16.89*  CALCIUM  9.1 8.5*   GFR: Estimated Creatinine Clearance: 6.4 mL/min (A) (by C-G formula based on SCr of 16.89 mg/dL (H)). Liver Function Tests: Recent Labs  Lab 01/11/20 0333 01/11/20 1756  AST 15 18  ALT 10 10  ALKPHOS 91 84  BILITOT 0.9 0.6  PROT 8.8* 8.1  ALBUMIN 3.3* 3.2*   No results for input(s): LIPASE, AMYLASE in the last 168 hours. No results for input(s): AMMONIA in the last 168 hours. Coagulation Profile: No results for input(s): INR, PROTIME in the last 168 hours. Cardiac Enzymes: No results for input(s): CKTOTAL, CKMB, CKMBINDEX, TROPONINI in the last 168 hours. BNP (last 3 results) No results for input(s): PROBNP in the last 8760 hours. HbA1C: No results for input(s): HGBA1C in the last 72 hours. CBG: No results for input(s): GLUCAP in the last 168 hours. Lipid Profile: No results for input(s): CHOL, HDL, LDLCALC, TRIG, CHOLHDL, LDLDIRECT in the last 72 hours. Thyroid Function Tests: No results for input(s): TSH, T4TOTAL, FREET4, T3FREE, THYROIDAB in the last 72 hours. Anemia Panel: Recent Labs    01/11/20 1610  FERRITIN 1,284*   Urine analysis:    Component Value Date/Time   COLORURINE YELLOW 01/10/2012 [place] 01/10/2012 1404   LABSPEC 1.023 01/10/2012 1404   PHURINE 6.0 01/10/2012 1404   GLUCOSEU >1000 (A) 01/10/2012 1404   HGBUR TRACE (A) 01/10/2012 1404   BILIRUBINUR NEGATIVE 01/10/2012 1404   KETONESUR NEGATIVE 01/10/2012 1404   PROTEINUR >300 (A) 01/10/2012 1404   UROBILINOGEN 1.0 01/10/2012 1404   NITRITE NEGATIVE 01/10/2012 1404   LEUKOCYTESUR NEGATIVE 01/10/2012 1404    Radiological Exams on Admission: DG Chest Port 1 View  Result Date:  01/11/2020 CLINICAL DATA:  Shortness of breath EXAM: PORTABLE CHEST 1 VIEW COMPARISON:  01/10/2019 FINDINGS: There is bilateral diffuse interstitial thickening. There is a nodular appearance along the right suprahilar region likely reflecting prominent pulmonary vasculature or airspace disease. There is no pleural effusion or pneumothorax. The heart mediastinum are stable. There is a large bore left-sided central venous catheter with the tip projecting over the cavoatrial junction. There is no acute osseous abnormality. IMPRESSION: Diffuse bilateral interstitial thickening likely reflecting interstitial edema. Nodular appearance along the right suprahilar region likely reflecting prominent pulmonary vasculature or airspace disease. Electronically Signed   By: Kathreen Devoid   On: 01/11/2020 16:31   DG Chest Port 1 View  Result Date: 01/11/2020 CLINICAL DATA:  Cough, short of breath EXAM: PORTABLE CHEST 1 VIEW COMPARISON:  03/07/2017 FINDINGS: Single frontal view of the chest demonstrates a left internal jugular dialysis catheter tip overlying atrial caval junction. Vascular stent overlies the right axilla. Cardiac silhouette is stable. There is a 7.3 cm mass projecting over the right anterior first and second ribs. CT chest with IV contrast recommended for suspected neoplasm. There is central vascular congestion with diffuse interstitial prominence consistent with mild fluid overload. Right hilar prominence could also reflect lymphadenopathy. No effusion or pneumothorax. No acute bony abnormality. IMPRESSION: 1. 7.3 cm right upper lobe mass consistent with neoplasm. CT chest with IV contrast is recommended. 2. Right hilar prominence could reflect underlying adenopathy. 3. Vascular congestion and interstitial edema. Electronically Signed   By: Randa Ngo M.D.   On: 01/11/2020 04:02    EKG: Independently reviewed.   Assessment/Plan  Acute hypoxic respiratory failure secondary to COVID pneumonia with  suspected superimposed bacterial pneumonia On 3L  Maintain O2 > 92%  Received Vanc and Cefepime in ED. Will switch to Rocephin and Azithromycin  IV Decadron  Remdesivir Monitor inflammatory markers CXR from earlier ED evaluation showed possible right upper lobe mass that appears to be more pulmonary vasculature on repeat. Will need to consider repeat CXR after clinical improvement to see if CT chest is warranted.   ESRD on HD M/W/F Creatinine mildly elevated at 16.89 with anion gap of 18 Will monitor and transfer to [facility] for regular dialysis  Nephrology will need to be consulted at [facility]  Anemia Hemoglobin of 8 which is down from normal 5 months ago Could be secondary to dialysis this morning and anemia of chronic disease. Continue to monitor and can do further work-up pending repeat  Hyponatremia Sodium of 130 Continue to monitor for now.  Might consider small fluid bolus.  Mild hyperkalemia Potassium of 5.3 Monitor for now-consider Kayexalate if continues to have upward trend  Type 2 diabetes Moderate SSI for now  HTN  Continue amlodipine, Coreg and Lasix  OSA Not on CPAP  Hyperlipidemia Continue statin  Morbid obesity BMI greater than 37  DVT prophylaxis: Heparin Code Status: Full Family Communication: Plan discussed with patient at bedside  disposition Plan: Home with at least 2 midnight stays  Consults called:  Admission status: inpatient with at least 2 midnights due to acute hypoxic respiratory failure due to Covid pneumonia and superimposed bacterial infection.  Will need to be transferred to [facility] also for dialysis.     Orene Desanctis DO [facility]   If 7PM-7AM, please contact night-coverage [email or web address]   01/11/2020, 7:04 PM

## 2020-01-11 NOTE — ED Triage Notes (Signed)
Pt arriving POV with complaint of shortness of breath and cough that began tonight. Pt also reports he missed a dialysis appointment.

## 2020-01-11 NOTE — Discharge Instructions (Signed)
Please go straight to your dialysis appointment at 6:30 this morning.  Additionally you will need to make an appointment for [facility] or [facility] to have further [facility]-up regarding your right lung mass.

## 2020-01-11 NOTE — ED Notes (Signed)
[facility] called for transport. Should arrive around 2040

## 2020-01-11 NOTE — ED Provider Notes (Signed)
[facility] DEPT Provider Note   CSN: [id number] Arrival date & time: 01/11/20  1438     History Chief Complaint  Patient presents with  . Emesis  . Shortness of Breath  . Sore Throat    [name] is a 64 y.o. male.  Patient complains of shortness of breath.  Patient has a history of kidney disease and dialysis.  Patient was seen earlier today with a lung mass and elevated white count he was discharged home to get dialysis,   But returns today  The history is provided by the patient. No language interpreter was used.  Shortness of Breath Severity:  Moderate Onset quality:  Sudden Timing:  Constant Progression:  Worsening Chronicity:  Recurrent Context: activity   Relieved by:  Nothing Worsened by:  Activity Ineffective treatments:  None tried Associated symptoms: no abdominal pain, no chest pain, no cough, no headaches and no rash        Past Medical History:  Diagnosis Date  . Alcohol abuse   . Congestive heart disease ([place]) 12/2011  . Diabetes mellitus   . GERD (gastroesophageal reflux disease)   . Gout   . Headache   . Hepatitis    being treated for Hepatitis C  . HOH (hard of hearing)   . Hyperlipidemia   . Hyperlipidemia   . Hypertension   . Monoclonal gammopathies 02/25/2012  . Obesity   . Pedal edema   . Pneumonia   . Renal insufficiency   . Sleep apnea    uses cpap  . Tobacco abuse   . Wears hearing aid    b/l    Patient Active Problem List   Diagnosis Date Noted  . Effusion of lower leg joint 07/13/2018  . Absolute anemia 07/13/2018  . Benign essential HTN 07/13/2018  . Carpal tunnel syndrome of right wrist 05/19/2017  . Hand pain, right 05/10/2017  . Gram-neg septicemia ([place])   . Scrotal edema   . ESRD (end stage renal disease) on dialysis ([place])   . Scrotal pain   . Sepsis ([place])   . Acute respiratory failure with hypoxia ([place]) 03/04/2017  . Scrotal swelling 03/04/2017  . Chronic hepatitis C without  hepatic coma ([place]) 11/17/2016  . Pulmonary nodule 04/02/2014  . Sleep apnea 04/02/2014  . Allergic rhinitis 04/02/2014  . End stage renal disease ([place]) 02/13/2014  . Diabetes mellitus type 2, uncontrolled ([place]) 01/21/2014  . PNA (pneumonia) 01/21/2014  . Pedal edema   . Monoclonal gammopathies 02/25/2012  . Obesity 01/10/2012  . Chronic kidney disease 01/10/2012  . Diabetes mellitus ([place]) 01/10/2012  . Hypertension   . Hyperlipidemia   . Gout   . Alcohol abuse   . Tobacco abuse     Past Surgical History:  Procedure Laterality Date  . AV FISTULA PLACEMENT Right 01/10/2014   Procedure: ARTERIOVENOUS (AV) FISTULA CREATION- RIGHT RADIOCEPHALIC ;  Surgeon: Angelia Mould, MD;  Location: [facility];  Service: Vascular;  Laterality: Right;  . COLONOSCOPY    . EYE SURGERY     cataract surgery, bilateral  . Ganglion cyst removal x 2    . INSERTION OF DIALYSIS CATHETER N/A 01/07/2014   Procedure: INSERTION OF DIALYSIS CATHETER;  Surgeon: Conrad Scottsburg, MD;  Location: [facility];  Service: Vascular;  Laterality: N/A;  . Left knee arthroscopy with generalized joint synovectomy,  01/2006  . LIGATION OF COMPETING BRANCHES OF ARTERIOVENOUS FISTULA Right 05/28/2014   Procedure: LIGATION OF COMPETING BRANCHES OF ARTERIOVENOUS FISTULA;  Surgeon: Angelia Mould, MD;  Location: [facility];  Service: Vascular;  Laterality: Right;  . REVISON OF ARTERIOVENOUS FISTULA Right 11/20/1094   Procedure: PLICATION REPAIR OF ARTERIOVENOUS FISTULA PSEUDOANEURYSM;  Surgeon: Serafina Mitchell, MD;  Location: [facility];  Service: Vascular;  Laterality: Right;  . REVISON OF ARTERIOVENOUS FISTULA Right 08/08/2018   Procedure: REVISION PLICATION OF ARTERIOVENOUS FISTULA FOREARM;  Surgeon: Angelia Mould, MD;  Location: [facility];  Service: Vascular;  Laterality: Right;  . REVISON OF ARTERIOVENOUS FISTULA Right 08/02/2019   Procedure: Artegraft placement right arm arteriovenous graft;  Surgeon: Waynetta Sandy, MD;   Location: [facility];  Service: Vascular;  Laterality: Right;  . SCROTAL EXPLORATION N/A 03/11/2017   Procedure: SCROTUM EXPLORATION AND DEBRIDEMENT;  Surgeon: Franchot Gallo, MD;  Location: [facility];  Service: Urology;  Laterality: N/A;  . SCROTAL EXPLORATION N/A 03/21/2017   Procedure: REPEAT SCROTAL DEBRIDEMENT;  Surgeon: Franchot Gallo, MD;  Location: [facility];  Service: Urology;  Laterality: N/A;  . SHUNTOGRAM Right 05/14/2014   Procedure: FISTULOGRAM;  Surgeon: Serafina Mitchell, MD;  Location: [facility] CATH LAB;  Service: Cardiovascular;  Laterality: Right;  . THROMBECTOMY W/ EMBOLECTOMY Right 07/04/2017   Procedure: EXPLORATION AND THROMBECTOMY ARTERIOVENOUS FISTULA RIGHT ARM;  Surgeon: Rosetta Posner, MD;  Location: [facility];  Service: Vascular;  Laterality: Right;       Family History  Problem Relation Age of Onset  . Diabetes Mother   . Heart disease Mother   . Lung cancer Father     Social History   Tobacco Use  . Smoking status: Former Smoker    Packs/day: 0.30    Years: 33.00    Pack years: 9.90    Types: Cigarettes    Quit date: 07/16/2010    Years since quitting: 9.4  . Smokeless tobacco: Never Used  Substance Use Topics  . Alcohol use: No  . Drug use: No    Home Medications Prior to Admission medications   Medication Sig Start Date End Date Taking? Authorizing Provider  acetaminophen (TYLENOL) 325 MG tablet Take 650 mg by mouth every 6 (six) hours as needed for mild pain, moderate pain, fever or headache.     [provider]  allopurinol (ZYLOPRIM) 100 MG tablet Take 1 tablet (100 mg total) by mouth daily. Patient taking differently: Take 100 mg by mouth 2 (two) times daily.  03/14/17   Rai, Ripudeep K, MD  amLODipine (NORVASC) 5 MG tablet Take 5 mg by mouth daily.    [provider]  AURYXIA 1 GM 210 MG(Fe) tablet Take 420 mg by mouth 2 (two) times daily with a meal.  07/14/18   [provider]  carvedilol (COREG) 6.25 MG tablet Take 6.25 mg by mouth 2  (two) times daily with a meal.    [provider]  cinacalcet (SENSIPAR) 90 MG tablet Take 90 mg by mouth every Monday, Wednesday, and Friday.     [provider]  clotrimazole (LOTRIMIN) 1 % cream Apply to both feet and between toes twice daily 10/19/18   [place], Stephani Police, DPM  COLCRYS 0.6 MG tablet Take 0.6 mg by mouth daily as needed (gout).  01/28/19   [provider]  diphenhydrAMINE (BENADRYL) 25 MG tablet Take 25 mg by mouth every Monday, Wednesday, and Friday.    [provider]  furosemide (LASIX) 40 MG tablet Take 40 mg by mouth daily.    [provider]  gabapentin (NEURONTIN) 100 MG capsule Take 100 mg  by mouth 2 (two) times daily.     [provider]  heparin 1000 UNIT/ML injection Heparin Sodium (Porcine) 1,000 Units/mL Systemic 12/20/18 06/04/20  [provider]  HYDROcodone-acetaminophen (NORCO) 5-325 MG tablet Take 1 tablet by mouth every 6 (six) hours as needed for moderate pain. 08/02/19   Dagoberto Ligas, PA-C  Insulin Detemir (LEVEMIR FLEXTOUCH) 100 UNIT/ML Pen Inject 20 Units into the skin at bedtime.     [provider]  omeprazole (PRILOSEC) 20 MG capsule Take by mouth daily. 08/03/19   [provider]  pravastatin (PRAVACHOL) 20 MG tablet Take 20 mg by mouth daily.     [provider]    Allergies    Penicillin g, Penicillins, Propolis, and Vioxx [rofecoxib]  Review of Systems   Review of Systems  Constitutional: Negative for appetite change and fatigue.  HENT: Negative for congestion, ear discharge and sinus pressure.   Eyes: Negative for discharge.  Respiratory: Positive for shortness of breath. Negative for cough.   Cardiovascular: Negative for chest pain.  Gastrointestinal: Negative for abdominal pain and diarrhea.  Genitourinary: Negative for frequency and hematuria.  Musculoskeletal: Negative for back pain.  Skin: Negative for rash.  Neurological: Negative for seizures and  headaches.  Psychiatric/Behavioral: Negative for hallucinations.    Physical Exam Updated Vital Signs BP 105/64   Pulse 91   Temp 97.9 F (36.6 C) (Oral)   Resp (!) 26   Ht 6\' 1"  (1.854 m)   Wt 128 kg   SpO2 94%   BMI 37.23 kg/m   Physical Exam Vitals and nursing note reviewed.  Constitutional:      Appearance: He is well-developed.  HENT:     Head: Normocephalic.     Nose: Nose normal.  Eyes:     General: No scleral icterus.    Conjunctiva/sclera: Conjunctivae normal.  Neck:     Thyroid: No thyromegaly.  Cardiovascular:     Rate and Rhythm: Normal rate and regular rhythm.     Heart sounds: No murmur. No friction rub. No gallop.   Pulmonary:     Breath sounds: No stridor. No wheezing or rales.  Chest:     Chest wall: No tenderness.  Abdominal:     General: There is no distension.     Tenderness: There is no abdominal tenderness. There is no rebound.  Musculoskeletal:        General: Normal range of motion.     Cervical back: Neck supple.  Lymphadenopathy:     Cervical: No cervical adenopathy.  Skin:    Findings: No erythema or rash.  Neurological:     Mental Status: He is oriented to person, place, and time.     Motor: No abnormal muscle tone.     Coordination: Coordination normal.  Psychiatric:        Behavior: Behavior normal.     ED Results / Procedures / Treatments   Labs (all labs ordered are listed, but only abnormal results are displayed) Labs Reviewed  CBC WITH DIFFERENTIAL/PLATELET - Abnormal; Notable for the following components:      Result Value   WBC 34.5 (*)    RBC 3.81 (*)    Hemoglobin 9.0 (*)    HCT 28.7 (*)    MCV 75.3 (*)    MCH 23.6 (*)    RDW 17.9 (*)    Neutro Abs 29.8 (*)    Monocytes Absolute 1.5 (*)    Abs Immature Granulocytes 2.18 (*)    All other  components within normal limits  BASIC METABOLIC PANEL - Abnormal; Notable for the following components:   Sodium 130 (*)    Potassium 5.3 (*)    Chloride 90 (*)     Glucose, Bld 135 (*)    BUN 57 (*)    Creatinine, Ser 16.89 (*)    Calcium 8.5 (*)    GFR calc non Af Amer 3 (*)    GFR calc Af Amer 3 (*)    Anion gap 18 (*)    All other components within normal limits  RESPIRATORY PANEL BY RT PCR (FLU A&B, COVID)  CULTURE, BLOOD (ROUTINE X 2)  CULTURE, BLOOD (ROUTINE X 2)  BRAIN NATRIURETIC PEPTIDE  LACTIC ACID, PLASMA  LACTIC ACID, PLASMA    EKG None  Radiology DG Chest Port 1 View  Result Date: 01/11/2020 CLINICAL DATA:  Shortness of breath EXAM: PORTABLE CHEST 1 VIEW COMPARISON:  01/10/2019 FINDINGS: There is bilateral diffuse interstitial thickening. There is a nodular appearance along the right suprahilar region likely reflecting prominent pulmonary vasculature or airspace disease. There is no pleural effusion or pneumothorax. The heart mediastinum are stable. There is a large bore left-sided central venous catheter with the tip projecting over the cavoatrial junction. There is no acute osseous abnormality. IMPRESSION: Diffuse bilateral interstitial thickening likely reflecting interstitial edema. Nodular appearance along the right suprahilar region likely reflecting prominent pulmonary vasculature or airspace disease. Electronically Signed   By: Kathreen Devoid   On: 01/11/2020 16:31   DG Chest Port 1 View  Result Date: 01/11/2020 CLINICAL DATA:  Cough, short of breath EXAM: PORTABLE CHEST 1 VIEW COMPARISON:  03/07/2017 FINDINGS: Single frontal view of the chest demonstrates a left internal jugular dialysis catheter tip overlying atrial caval junction. Vascular stent overlies the right axilla. Cardiac silhouette is stable. There is a 7.3 cm mass projecting over the right anterior first and second ribs. CT chest with IV contrast recommended for suspected neoplasm. There is central vascular congestion with diffuse interstitial prominence consistent with mild fluid overload. Right hilar prominence could also reflect lymphadenopathy. No effusion or  pneumothorax. No acute bony abnormality. IMPRESSION: 1. 7.3 cm right upper lobe mass consistent with neoplasm. CT chest with IV contrast is recommended. 2. Right hilar prominence could reflect underlying adenopathy. 3. Vascular congestion and interstitial edema. Electronically Signed   By: Randa Ngo M.D.   On: 01/11/2020 04:02    Procedures Procedures (including critical care time)  Medications Ordered in ED Medications  ceFEPIme (MAXIPIME) 1 g in sodium chloride 0.9 % 100 mL IVPB (has no administration in time range)  vancomycin (VANCOCIN) 2,500 mg in sodium chloride 0.9 % 500 mL IVPB (has no administration in time range)  albuterol (VENTOLIN HFA) 108 (90 Base) MCG/ACT inhaler 4 puff (4 puffs Inhalation Given 01/11/20 1640)    ED Course  I have reviewed the triage vital signs and the nursing notes.  Pertinent labs & imaging results that were available during my care of the patient were reviewed by me and considered in my medical decision making (see chart for details).    MDM Rules/Calculators/A&P                     CRITICAL CARE Performed by: Milton Ferguson Total critical care time: 40 minutes Critical care time was exclusive of separately billable procedures and treating other patients. Critical care was necessary to treat or prevent imminent or life-threatening deterioration. Critical care was time spent personally by me on the following activities:  development of treatment plan with patient and/or surrogate as well as nursing, discussions with consultants, evaluation of patient's response to treatment, examination of patient, obtaining history from patient or surrogate, ordering and performing treatments and interventions, ordering and review of laboratory studies, ordering and review of radiographic studies, pulse oximetry and re-evaluation of patient's condition.  Patient has been hypoxic on room air.  Pulse ox 80%.  Patient improved with 2 L oxygen.  Patient has elevated wbc.   He will be admitted for hypoxia and leukocytosis Final Clinical Impression(s) / ED Diagnoses Final diagnoses:  None    Rx / DC Orders ED Discharge Orders    None       Milton Ferguson, MD 01/13/20 1211

## 2020-01-11 NOTE — ED Notes (Signed)
[facility] called back, there will be a delay of several hours in transport.

## 2020-01-11 NOTE — ED Triage Notes (Signed)
Patient states that he was here this AM with same symptoms of SOB, sore throat, and emesis.  Patient states, "I ain't doing no better."  Patient states, "I was told I had a spot on my chest and to call the doctor." Patient states he went home and was having trouble breathing.

## 2020-01-11 NOTE — Progress Notes (Signed)
A consult was received from an ED physician for vancomycin per pharmacy dosing.  The patient's profile has been reviewed for ht/wt/allergies/indication/available labs.    TBW 128 kg. Patient has ESRD on HD.  A one time order has been placed for vancomycin 2500 mg.    Further antibiotics/pharmacy consults should be ordered by admitting physician if indicated.                       Thank you, Lenis Noon, PharmD 01/11/2020  5:00 PM

## 2020-01-11 NOTE — ED Notes (Signed)
Patient removed from oxygen per MD, oxygen in low 80s RAN, 3 L [place] applied.

## 2020-01-11 NOTE — ED Provider Notes (Signed)
Given patient's WBC in addition to new oxygen requirement and possible pneumonia on chest x-ray, he was started being treated for hospital-acquired pneumonia.  Vanco and cefepime.  Now, his Covid test is positive.  This is likely more explaining his abnormal chest x-ray findings and hypoxia. He is on 3L O2. I discussed his diagnosis with him.  Symptoms started on 2/24.  He will need admission.  CRITICAL CARE Performed by: Ephraim Hamburger   Total critical care time: 35 minutes  Critical care time was exclusive of separately billable procedures and treating other patients.  Critical care was necessary to treat or prevent imminent or life-threatening deterioration.  Critical care was time spent personally by me on the following activities: development of treatment plan with patient and/or surrogate as well as nursing, discussions with consultants, evaluation of patient's response to treatment, examination of patient, obtaining history from patient or surrogate, ordering and performing treatments and interventions, ordering and review of laboratory studies, ordering and review of radiographic studies, pulse oximetry and re-evaluation of patient's condition.    [name] was evaluated in Emergency Department on 01/11/2020 for the symptoms described in the history of present illness. He was evaluated in the context of the global COVID-19 pandemic, which necessitated consideration that the patient might be at risk for infection with the SARS-CoV-2 virus that causes COVID-19. Institutional protocols and algorithms that pertain to the evaluation of patients at risk for COVID-19 are in a state of rapid change based on information released by regulatory bodies including the CDC and federal and state organizations. These policies and algorithms were followed during the patient's care in the ED.    Sherwood Gambler, MD 01/11/20 [phone]

## 2020-01-12 ENCOUNTER — Encounter (HOSPITAL_COMMUNITY): Payer: Self-pay | Admitting: Family Medicine

## 2020-01-12 ENCOUNTER — Inpatient Hospital Stay (HOSPITAL_COMMUNITY): Payer: Medicare Other

## 2020-01-12 DIAGNOSIS — U071 COVID-19: Secondary | ICD-10-CM

## 2020-01-12 DIAGNOSIS — R9389 Abnormal findings on diagnostic imaging of other specified body structures: Secondary | ICD-10-CM

## 2020-01-12 DIAGNOSIS — J1282 Pneumonia due to coronavirus disease 2019: Secondary | ICD-10-CM

## 2020-01-12 DIAGNOSIS — Z992 Dependence on renal dialysis: Secondary | ICD-10-CM

## 2020-01-12 DIAGNOSIS — L8990 Pressure ulcer of unspecified site, unspecified stage: Secondary | ICD-10-CM | POA: Insufficient documentation

## 2020-01-12 DIAGNOSIS — N186 End stage renal disease: Secondary | ICD-10-CM

## 2020-01-12 LAB — CBC
HCT: 27.6 % — ABNORMAL LOW (ref 39.0–52.0)
Hemoglobin: 8.8 g/dL — ABNORMAL LOW (ref 13.0–17.0)
MCH: 23.8 pg — ABNORMAL LOW (ref 26.0–34.0)
MCHC: 31.9 g/dL (ref 30.0–36.0)
MCV: 74.6 fL — ABNORMAL LOW (ref 80.0–100.0)
Platelets: 323 10*3/uL (ref 150–400)
RBC: 3.7 MIL/uL — ABNORMAL LOW (ref 4.22–5.81)
RDW: 17.4 % — ABNORMAL HIGH (ref 11.5–15.5)
WBC: 36.5 10*3/uL — ABNORMAL HIGH (ref 4.0–10.5)
nRBC: 0 % (ref 0.0–0.2)

## 2020-01-12 LAB — BASIC METABOLIC PANEL
Anion gap: 19 — ABNORMAL HIGH (ref 5–15)
BUN: 66 mg/dL — ABNORMAL HIGH (ref 8–23)
CO2: 19 mmol/L — ABNORMAL LOW (ref 22–32)
Calcium: 8.6 mg/dL — ABNORMAL LOW (ref 8.9–10.3)
Chloride: 91 mmol/L — ABNORMAL LOW (ref 98–111)
Creatinine, Ser: 17.51 mg/dL — ABNORMAL HIGH (ref 0.61–1.24)
GFR calc Af Amer: 3 mL/min — ABNORMAL LOW (ref >60)
GFR calc non Af Amer: 3 mL/min — ABNORMAL LOW (ref >60)
Glucose, Bld: 209 mg/dL — ABNORMAL HIGH (ref 70–99)
Potassium: 4.8 mmol/L (ref 3.5–5.1)
Sodium: 129 mmol/L — ABNORMAL LOW (ref 135–145)

## 2020-01-12 LAB — PROCALCITONIN: Procalcitonin: 28.29 ng/mL

## 2020-01-12 LAB — MRSA PCR SCREENING: MRSA by PCR: NEGATIVE

## 2020-01-12 LAB — GLUCOSE, CAPILLARY
Glucose-Capillary: 169 mg/dL — ABNORMAL HIGH (ref 70–99)
Glucose-Capillary: 202 mg/dL — ABNORMAL HIGH (ref 70–99)
Glucose-Capillary: 144 mg/dL — ABNORMAL HIGH (ref 70–99)
Glucose-Capillary: 116 mg/dL — ABNORMAL HIGH (ref 70–99)
Glucose-Capillary: 267 mg/dL — ABNORMAL HIGH (ref 70–99)

## 2020-01-12 LAB — MAGNESIUM: Magnesium: 1.9 mg/dL (ref 1.7–2.4)

## 2020-01-12 LAB — HEPATITIS B SURFACE ANTIGEN: Hepatitis B Surface Ag: NONREACTIVE

## 2020-01-12 LAB — HEPATITIS B SURFACE ANTIBODY,QUALITATIVE: Hep B S Ab: REACTIVE — AB

## 2020-01-12 LAB — BRAIN NATRIURETIC PEPTIDE: B Natriuretic Peptide: 320.2 pg/mL — ABNORMAL HIGH (ref 0.0–100.0)

## 2020-01-12 LAB — C-REACTIVE PROTEIN: CRP: 42.4 mg/dL — ABNORMAL HIGH (ref <1.0)

## 2020-01-12 LAB — HEPATITIS B CORE ANTIBODY, TOTAL: Hep B Core Total Ab: NONREACTIVE

## 2020-01-12 LAB — D-DIMER, QUANTITATIVE: D-Dimer, Quant: 8.79 ug/mL-FEU — ABNORMAL HIGH (ref 0.00–0.50)

## 2020-01-12 MED ORDER — SODIUM CHLORIDE 0.9 % IV SOLN
1.0000 g | INTRAVENOUS | Status: DC
  Administered 2020-01-12 – 2020-01-13 (×2): 1 g via INTRAVENOUS
  Filled 2020-01-12 (×3): qty 1

## 2020-01-12 MED ORDER — CHLORHEXIDINE GLUCONATE CLOTH 2 % EX PADS
6.0000 | MEDICATED_PAD | Freq: Every day | CUTANEOUS | Status: DC
  Administered 2020-01-12 – 2020-01-16 (×5): 6 via TOPICAL

## 2020-01-12 MED ORDER — VANCOMYCIN VARIABLE DOSE PER UNSTABLE RENAL FUNCTION (PHARMACIST DOSING): Status: DC

## 2020-01-12 MED ORDER — HEPARIN SODIUM (PORCINE) 1000 UNIT/ML DIALYSIS: 5000.0000 [IU] | INTRAMUSCULAR | Status: AC | PRN

## 2020-01-12 MED ORDER — SODIUM CHLORIDE 0.9 % IV SOLN
25.0000 mg | Freq: Three times a day (TID) | INTRAVENOUS | Status: DC | PRN
  Administered 2020-01-12: 25 mg via INTRAVENOUS
  Filled 2020-01-12 (×2): qty 1

## 2020-01-12 MED ORDER — CHLORHEXIDINE GLUCONATE CLOTH 2 % EX PADS
6.0000 | MEDICATED_PAD | Freq: Every day | CUTANEOUS | Status: DC
  Administered 2020-01-12 – 2020-01-13 (×2): 6 via TOPICAL

## 2020-01-12 MED ORDER — HEPARIN SODIUM (PORCINE) 10000 UNIT/ML IJ SOLN
10000.0000 [IU] | Freq: Three times a day (TID) | INTRAMUSCULAR | Status: DC
  Administered 2020-01-12 – 2020-01-14 (×5): 10000 [IU] via SUBCUTANEOUS
  Filled 2020-01-12 (×7): qty 1

## 2020-01-12 NOTE — Consult Note (Signed)
NAME:  [name], MRN:  [id number], DOB:  [birth year], LOS: 1 ADMISSION DATE:  01/11/2020, CONSULTATION DATE:  01/12/20 REFERRING MD:  Candiss Norse, CHIEF COMPLAINT:  unwellness   Brief History   64 year old man with hx of HTN, OSA, HepC, DM, ESRD on HD p/w URI symptoms and right chest pain found to be COVID positive as well as abnormal CXR.  History of present illness   64 year old man with hx of HTN, OSA, HepC, DM, ESRD on HD p/w URI symptoms and right chest pain found to be COVID positive as well as abnormal CXR.  He is a pretty poor historian and denies complaints to me.  Specifically I asked about cough, hemoptysis, chest pain, weight loss, fatigue, N/V/D and he denied all of these although it looks like he had many of these symptoms on admission.  Distant > 10 years ago history of ~10 pack years smoking. CXR showing mass-like consolidation in RUL for which pulmonology is consulted.   Past Medical History   Past Medical History:  Diagnosis Date  . Alcohol abuse   . Congestive heart disease ([place]) 12/2011  . Diabetes mellitus   . GERD (gastroesophageal reflux disease)   . Gout   . Headache   . Hepatitis    being treated for Hepatitis C  . HOH (hard of hearing)   . Hyperlipidemia   . Hyperlipidemia   . Hypertension   . Monoclonal gammopathies 02/25/2012  . Obesity   . Pedal edema   . Pneumonia   . Renal insufficiency   . Sleep apnea    uses cpap  . Tobacco abuse   . Wears hearing aid    b/l     Significant Hospital Events   2/26 admitted  Consults:  PCCM, Nephrology  Procedures:  N/A  Significant Diagnostic Tests:  CT Chest>>  Micro Data:  COVID + Pct + Blood cx x 2 >>  Antimicrobials:  Vanc>> Cefepime>> Remdesivir>>   Interim history/subjective:  Consulted  Objective   Blood pressure (!) 111/53, pulse 78, temperature 97.7 F (36.5 C), temperature source Oral, resp. rate (!) 21, height 6\' 1"  (1.854 m), weight 132.2 kg, SpO2 97 %.         Intake/Output Summary (Last 24 hours) at 01/12/2020 1451 Last data filed at 01/12/2020 1300 Gross per 24 hour  Intake 1181.68 ml  Output 2 ml  Net 1179.68 ml   Filed Weights   01/11/20 1453 01/12/20 0529  Weight: 128 kg 132.2 kg    Examination: General: no acute distress breathing comfortably on RA HENT: MMM, malampatti 3 Lungs: Scattered rhonci, no accessory muscle use Cardiovascular: RRR, ext warm Abdomen: Soft, +BS Extremities: Trace edema Neuro: Moves all 4 ext to command Skin: R tunneled HD line CDI  Resolved Hospital Problem list   N/A  Assessment & Plan:  Abnormal CXR chest- relatively minimal smoking history.  Reasonable to get a CT and figure out what we are dealing with.  Plan to narrow abx rapidly. - CT Chest w/o contrast is fine, further recs pending results of this - Encourage IS  COVID +: management per primary  ESRD: HD per primary  Labs   CBC: Recent Labs  Lab 01/11/20 0333 01/11/20 1610 01/12/20 0849  WBC 23.7* 34.5* 36.5*  NEUTROABS 20.5* 29.8*  --   HGB 10.5* 9.0* 8.8*  HCT 33.5* 28.7* 27.6*  MCV 76.7* 75.3* 74.6*  PLT 383 303 939    Basic Metabolic Panel: Recent Labs  Lab 01/11/20 0333 01/11/20 1610 01/12/20 0849  NA 128* 130* 129*  K 6.1* 5.3* 4.8  CL 91* 90* 91*  CO2 16* 22 19*  GLUCOSE 120* 135* 209*  BUN 83* 57* 66*  CREATININE 22.62* 16.89* 17.51*  CALCIUM 9.1 8.5* 8.6*  MG  --   --  1.9   GFR: Estimated Creatinine Clearance: 6.2 mL/min (A) (by C-G formula based on SCr of 17.51 mg/dL (H)). Recent Labs  Lab 01/11/20 0333 01/11/20 1610 01/11/20 1746 01/11/20 1756 01/11/20 2030 01/12/20 0849  PROCALCITON  --   --   --  22.59  --  28.29  WBC 23.7* 34.5*  --   --   --  36.5*  LATICACIDVEN  --   --  2.2*  --  1.9  --     Liver Function Tests: Recent Labs  Lab 01/11/20 0333 01/11/20 1756  AST 15 18  ALT 10 10  ALKPHOS 91 84  BILITOT 0.9 0.6  PROT 8.8* 8.1  ALBUMIN 3.3* 3.2*   No results for input(s):  LIPASE, AMYLASE in the last 168 hours. No results for input(s): AMMONIA in the last 168 hours.  ABG    Component Value Date/Time   PHART 7.386 03/06/2017 0830   PCO2ART 38.5 03/06/2017 0830   PO2ART 71.1 (L) 03/06/2017 0830   HCO3 22.6 03/06/2017 0830   TCO2 23 08/02/2019 0823   ACIDBASEDEF 1.7 03/06/2017 0830   O2SAT 89.9 03/06/2017 0830     Coagulation Profile: No results for input(s): INR, PROTIME in the last 168 hours.  Cardiac Enzymes: No results for input(s): CKTOTAL, CKMB, CKMBINDEX, TROPONINI in the last 168 hours.  HbA1C: Hgb A1c MFr Bld  Date/Time Value Ref Range Status  08/08/2018 08:55 AM 10.5 (H) 4.8 - 5.6 % Final    Comment:    (NOTE) Pre diabetes:          5.7%-6.4% Diabetes:              >6.4% Glycemic control for   <7.0% adults with diabetes   03/06/2017 08:30 AM 13.0 (H) 4.8 - 5.6 % Final    Comment:    (NOTE)         Pre-diabetes: 5.7 - 6.4         Diabetes: >6.4         Glycemic control for adults with diabetes: <7.0     CBG: Recent Labs  Lab 01/12/20 0135 01/12/20 0842 01/12/20 1224  GLUCAP 169* 202* 144*    Review of Systems:    Positive Symptoms in bold: (denied everything to me)  Constitutional fevers, chills, weight loss, fatigue, anorexia, malaise  Eyes decreased vision, double vision, eye irritation  Ears, Nose, Mouth, Throat sore throat, trouble swallowing, sinus congestion  Cardiovascular chest pain, paroxysmal nocturnal dyspnea, lower ext edema, palpitations   Respiratory SOB, cough, DOE, hemoptysis, wheezing  Gastrointestinal nausea, vomiting, diarrhea  Genitourinary burning with urination, trouble urinating  Musculoskeletal joint aches, joint swelling, back pain  Integumentary  rashes, skin lesions  Neurological focal weakness, focal numbness, trouble speaking, headaches  Psychiatric depression, anxiety, confusion  Endocrine polyuria, polydipsia, cold intolerance, heat intolerance  Hematologic abnormal bruising,  abnormal bleeding, unexplained nose bleeds  Allergic/Immunologic recurrent infections, hives, swollen lymph nodes     Past Medical History  He,  has a past medical history of Alcohol abuse, Congestive heart disease ([place]) (12/2011), Diabetes mellitus, GERD (gastroesophageal reflux disease), Gout, Headache, Hepatitis, HOH (hard of hearing), Hyperlipidemia, Hyperlipidemia, Hypertension, Monoclonal gammopathies (02/25/2012), Obesity,  Pedal edema, Pneumonia, Renal insufficiency, Sleep apnea, Tobacco abuse, and Wears hearing aid.   Surgical History    Past Surgical History:  Procedure Laterality Date  . AV FISTULA PLACEMENT Right 01/10/2014   Procedure: ARTERIOVENOUS (AV) FISTULA CREATION- RIGHT RADIOCEPHALIC ;  Surgeon: Angelia Mould, MD;  Location: [facility];  Service: Vascular;  Laterality: Right;  . COLONOSCOPY    . EYE SURGERY     cataract surgery, bilateral  . Ganglion cyst removal x 2    . INSERTION OF DIALYSIS CATHETER N/A 01/07/2014   Procedure: INSERTION OF DIALYSIS CATHETER;  Surgeon: Conrad Lake Waynoka, MD;  Location: [facility];  Service: Vascular;  Laterality: N/A;  . Left knee arthroscopy with generalized joint synovectomy,  01/2006  . LIGATION OF COMPETING BRANCHES OF ARTERIOVENOUS FISTULA Right 05/28/2014   Procedure: LIGATION OF COMPETING BRANCHES OF ARTERIOVENOUS FISTULA;  Surgeon: Angelia Mould, MD;  Location: [facility];  Service: Vascular;  Laterality: Right;  . REVISON OF ARTERIOVENOUS FISTULA Right 0/0/9381   Procedure: PLICATION REPAIR OF ARTERIOVENOUS FISTULA PSEUDOANEURYSM;  Surgeon: Serafina Mitchell, MD;  Location: [facility];  Service: Vascular;  Laterality: Right;  . REVISON OF ARTERIOVENOUS FISTULA Right 08/08/2018   Procedure: REVISION PLICATION OF ARTERIOVENOUS FISTULA FOREARM;  Surgeon: Angelia Mould, MD;  Location: [facility];  Service: Vascular;  Laterality: Right;  . REVISON OF ARTERIOVENOUS FISTULA Right 08/02/2019   Procedure: Artegraft placement right arm arteriovenous  graft;  Surgeon: Waynetta Sandy, MD;  Location: [facility];  Service: Vascular;  Laterality: Right;  . SCROTAL EXPLORATION N/A 03/11/2017   Procedure: SCROTUM EXPLORATION AND DEBRIDEMENT;  Surgeon: Franchot Gallo, MD;  Location: [facility];  Service: Urology;  Laterality: N/A;  . SCROTAL EXPLORATION N/A 03/21/2017   Procedure: REPEAT SCROTAL DEBRIDEMENT;  Surgeon: Franchot Gallo, MD;  Location: [facility];  Service: Urology;  Laterality: N/A;  . SHUNTOGRAM Right 05/14/2014   Procedure: FISTULOGRAM;  Surgeon: Serafina Mitchell, MD;  [facility] CATH LAB;  Service: Cardiovascular;  Laterality: Right;  . THROMBECTOMY W/ EMBOLECTOMY Right 07/04/2017   Procedure: EXPLORATION AND THROMBECTOMY ARTERIOVENOUS FISTULA RIGHT ARM;  Surgeon: Rosetta Posner, MD;  Location: [facility] OR;  Service: Vascular;  Laterality: Right;     Social History   reports that he quit smoking about 9 years ago. His smoking use included cigarettes. He has a 9.90 pack-year smoking history. He has never used smokeless tobacco. He reports that he does not drink alcohol or use drugs.   Family History   His family history includes Diabetes in his mother; Heart disease in his mother; Lung cancer in his father.   Allergies Allergies  Allergen Reactions  . Penicillin G   . Penicillins Nausea And Vomiting and Other (See Comments)    Has patient had a PCN reaction causing immediate rash, facial/tongue/throat swelling, SOB or lightheadedness with hypotension: No Has patient had a PCN reaction causing severe rash involving mucus membranes or skin necrosis: No Has patient had a PCN reaction that required hospitalization No Has patient had a PCN reaction occurring within the last 10 years: No If all of the above answers are "NO", then may proceed with Cephalosporin use.  Marland Kitchen Propolis Nausea And Vomiting  . Vioxx [Rofecoxib] Nausea And Vomiting     Home Medications  Prior to Admission medications   Medication Sig Start Date End Date Taking?  Authorizing Provider  acetaminophen (TYLENOL) 325 MG tablet Take 650 mg by mouth every 6 (six) hours as needed for mild pain, moderate  pain, fever or headache.    Yes [provider]  allopurinol (ZYLOPRIM) 100 MG tablet Take 1 tablet (100 mg total) by mouth daily. Patient taking differently: Take 100 mg by mouth 2 (two) times daily.  03/14/17  Yes Rai, Ripudeep K, MD  amLODipine (NORVASC) 5 MG tablet Take 5 mg by mouth daily.   Yes [provider]  AURYXIA 1 GM 210 MG(Fe) tablet Take 420 mg by mouth 2 (two) times daily with a meal.  07/14/18  Yes [provider]  carvedilol (COREG) 6.25 MG tablet Take 6.25 mg by mouth 2 (two) times daily with a meal.   Yes [provider]  cinacalcet (SENSIPAR) 90 MG tablet Take 90 mg by mouth every Monday, Wednesday, and Friday.    Yes [provider]  diphenhydrAMINE (BENADRYL) 25 MG tablet Take 25 mg by mouth every Monday, Wednesday, and Friday.   Yes [provider]  furosemide (LASIX) 40 MG tablet Take 40 mg by mouth daily.   Yes [provider]  Insulin Detemir (LEVEMIR FLEXTOUCH) 100 UNIT/ML Pen Inject 20 Units into the skin at bedtime.    Yes [provider]  mirtazapine (REMERON) 15 MG tablet Take 15 mg by mouth at bedtime as needed (sleep).   Yes [provider]  omeprazole (PRILOSEC) 20 MG capsule Take 20 mg by mouth daily.  08/03/19  Yes [provider]  ondansetron (ZOFRAN) 4 MG tablet Take 4 mg by mouth 3 (three) times daily as needed for nausea/vomiting. 01/07/20  Yes [provider]  pravastatin (PRAVACHOL) 20 MG tablet Take 20 mg by mouth daily.    Yes [provider]  clotrimazole (LOTRIMIN) 1 % cream Apply to both feet and between toes twice daily Patient not taking: Reported on 01/11/2020 10/19/18   Marzetta Board, DPM  HYDROcodone-acetaminophen (NORCO) 5-325 MG tablet Take 1 tablet by mouth every 6 (six) hours as needed for moderate  pain. Patient not taking: Reported on 01/11/2020 08/02/19   Dagoberto Ligas, PA-C

## 2020-01-12 NOTE — Progress Notes (Signed)
Pharmacy Antibiotic Note  [name] is a 64 y.o. male admitted on 01/11/2020 with COVID/ pneumonia. Patient present with malaise. Bcx 1/4 positive for GPC in chains. NO BCID. WBC 36.5, CRP 42.4, PCT 28.29.  Pharmacy has been consulted for cefepime and vancomycin dosing.  Plan: Given Vancomycin 2500 mg x1 dose on 2/26; no standing Vancomycin order due to no standing HD plans  - Will follow HD plans and place orders with HD sessions  Given Cefepime 1 gm IV x1 dose on 2/26 in ED; Will start cefepime 1 gm IV  q24hrs at 1800, today.  - Monitor cultures, clinical improvement and inflammatory markers.   Height: 6\' 1"  (185.4 cm) Weight: 291 lb 7.2 oz (132.2 kg) IBW/kg (Calculated) : 79.9  Temp (24hrs), Avg:97.9 F (36.6 C), Min:97.4 F (36.3 C), Max:98.6 F (37 C)  Recent Labs  Lab 01/11/20 0333 01/11/20 1610 01/11/20 1746 01/11/20 2030 01/12/20 0849  WBC 23.7* 34.5*  --   --  36.5*  CREATININE 22.62* 16.89*  --   --  17.51*  LATICACIDVEN  --   --  2.2* 1.9  --     Estimated Creatinine Clearance: 6.2 mL/min (A) (by C-G formula based on SCr of 17.51 mg/dL (H)).    Allergies  Allergen Reactions  . Penicillin G   . Penicillins Nausea And Vomiting and Other (See Comments)    Has patient had a PCN reaction causing immediate rash, facial/tongue/throat swelling, SOB or lightheadedness with hypotension: No Has patient had a PCN reaction causing severe rash involving mucus membranes or skin necrosis: No Has patient had a PCN reaction that required hospitalization No Has patient had a PCN reaction occurring within the last 10 years: No If all of the above answers are "NO", then may proceed with Cephalosporin use.  Marland Kitchen Propolis Nausea And Vomiting  . Vioxx [Rofecoxib] Nausea And Vomiting    Antimicrobials this admission: 2/26 Remdesivir >>  2/26 Vancomycin x1 dose  2/26 Cefepime >>  2/26 Azithromycin >> 2/27 2/26 Ceftriaxone >> 2/27   Microbiology results: 2/26 BCx: 1/4 GPC   2/27 MRSA PCR: Negative     Thank you for allowing pharmacy to be a part of this patient's care.  Acey Lav, PharmD  PGY1 Acute Care Pharmacy Resident 01/12/2020 1:49 PM

## 2020-01-12 NOTE — Progress Notes (Signed)
Inpatient Diabetes Program Recommendations  AACE/ADA: New Consensus Statement on Inpatient Glycemic Control (2015)  Target Ranges:  Prepandial:   less than 140 mg/dL      Peak postprandial:   less than 180 mg/dL (1-2 hours)      Critically ill patients:  140 - 180 mg/dL   Lab Results  Component Value Date   GLUCAP 144 (H) 01/12/2020   HGBA1C 10.5 (H) 08/08/2018    Review of Glycemic Control  Diabetes history: DM2 Outpatient Diabetes medications: Levemir 20 units QHS Current orders for Inpatient glycemic control: Novolog 0-15 units tidwc  On Decadron 6 mg Q24H HgbA1C pending. Previously 10.5% on 08/08/18 CBGs 202, 144 mg/dL thus far today  Inpatient Diabetes Program Recommendations:     Reduce Novolog to 0-9 units tidwc and HS (renal) Add tradjenta 5 mg QD Will likely need meal coverage insulin with Decadron. Awaiting HgbA1C results.  Will continue to follow.  Thank you. Lorenda Peck, RD, LDN, CDE Inpatient Diabetes Coordinator [phone]

## 2020-01-12 NOTE — Progress Notes (Signed)
Paged on call provider to make aware pt refusing to drink contrast for CT abdomen and refusing CT at this time. Will continue to monitor.

## 2020-01-12 NOTE — Progress Notes (Signed)
1 /4 [facility] PHARMACY - PHYSICIAN COMMUNICATION CRITICAL VALUE ALERT - BLOOD CULTURE IDENTIFICATION (BCID)  [name] is an 64 y.o. male who presented to [facility] on 01/11/2020 with a chief complaint of malaise  Assessment:  BCx 1/4 + GPC in chains. No BCID. Being treated for CAP.   Name of physician (or Provider) Contacted: Dr. Candiss Norse  Current antibiotics: azithromycin, ceftriaxone  Changes to prescribed antibiotics recommended:  Patient is on recommended antibiotics - No changes needed   Benetta Spar, PharmD, BCPS, [facility] Clinical Pharmacist  Please check AMION for all [facility] phone numbers After 10:00 PM, call [place] [phone]

## 2020-01-12 NOTE — Progress Notes (Addendum)
PROGRESS NOTE                                                                                                                                                                                                             Patient Demographics:    [name], is a 64 y.o. male, DOB - October 11, 1956, OUZ:[id number]  Outpatient Primary MD for the patient is [name], MD    LOS - 1  Admit date - 01/11/2020    Chief Complaint  Patient presents with  . Emesis  . Shortness of Breath  . Sore Throat       Brief Narrative  [name] is a 64 y.o. male with medical history significant for  HTN, OSA, chronic Hep C, Type 2 diabetes and ESRD on HD MW/F who presents with concerns of "not feeling well."  His work-up suggested pneumonia with chest x-ray showing right upper lobe mass suspicious for malignancy.  He also had COVID-19 infection and was admitted to the hospital.   Subjective:    [name] today has, No headache, No chest pain, No abdominal pain - No Nausea, No new weakness tingling or numbness, no cough or shortness of breath   Assessment  & Plan :     1. Acute Hypoxic Resp. Failure, positive COVID-19 infection and possible mild pneumonia, also newly diagnosed right upper lobe mass suspicious for postobstructive bacterial pneumonia-   I think his main issue is sepsis from right upper lobe mass with postobstructive bacterial pneumonia, he appears quite toxic and sick, procalcitonin and CRP are both elevated, he is on Decadron which I will continue, continue remdesivir, have consulted pharmacy to place him on vancomycin and cefepime.  Will obtain CT scan chest abdomen pelvis noncontrast, monitor him closely with his blood cultures and inflammatory markers.  His condition is quite tenuous.  Have also discussed with pulmonary critical care they will see the patient.  Encouraged the patient to sit up in chair in the daytime  use I-S and flutter valve for pulmonary toiletry and then prone in bed when at night.   SpO2: 97 % O2 Flow Rate (L/min): 2 L/min  Recent Labs  Lab 01/08/20 1010 01/11/20 1610 01/11/20 1615 01/11/20 1756 01/12/20 0849  CRP  --  34.4*  --   --  42.4*  DDIMER  --  10.25*  --   --  8.79*  FERRITIN  --  1,284*  --   --   --   BNP  --  103.8*  --   --  320.2*  PROCALCITON  --   --   --  22.59 28.29  SARSCOV2NAA NEGATIVE  --  POSITIVE*  --   --     Hepatic Function Latest Ref Rng & Units 01/11/2020 01/11/2020 03/14/2017  Total Protein 6.5 - 8.1 g/dL 8.1 8.8(H) -  Albumin 3.5 - 5.0 g/dL 3.2(L) 3.3(L) 2.1(L)  AST 15 - 41 U/L 18 15 -  ALT 0 - 44 U/L 10 10 -  Alk Phosphatase 38 - 126 U/L 84 91 -  Total Bilirubin 0.3 - 1.2 mg/dL 0.6 0.9 -  Bilirubin, Direct 0.0 - 0.2 mg/dL 0.3(H) - -    2.  Right upper lobe mass.  New diagnosis.  See above.  3.  ESRD, Monday Wednesday Friday schedule.  Nephrology consulted.  4.  Anemia of chronic disease.  Hemoglobin close to 8.  Will monitor.  No need for transfusion.  5.  Hyponatremia and hypernatremia.  Management per nephrology and HD.  6.  OSA.  Supplemental oxygen at night, does not use CPAP.  7.  Dyslipidemia.  On statin.  8.  Essential hypertension.  On combination of Norvasc, Coreg and Lasix continue.  Still makes some urine.  9.  Morbid obesity.  BMI 37.  Follow with PCP.  10.  Left foot Charcot joint, right lower extremity shallow 1 cm noninfected ulcer present on admission.  Supportive care.   11.  DM type II.  SSI for now.  Poor outpatient glycemic control due to hyperglycemia, A1c of 10.5.  Diabetic and insulin education will be provided.  Lab Results  Component Value Date   HGBA1C 10.5 (H) 08/08/2018   CBG (last 3)  Recent Labs    01/12/20 0135 01/12/20 0842  GLUCAP 169* 202*       Condition - Extremely Guarded  Family Communication  : Left message for wife on her cell phone on 01/12/2020 at 12:05 PM  Code Status :  Full code  Diet :    Diet Order            Diet renal with fluid restriction Fluid restriction: 1200 mL Fluid; Room service appropriate? Yes; Fluid consistency: Thin  Diet effective now               Disposition Plan  : Stay in the hospital, is septic with bacterial infection likely postobstructive pneumonia.  Has new lung mass which needs to be worked up highly suspicious for cancer.  Consults  : Pulmonary critical care called  Procedures  :    CT chest, abdomen and pelvis noncontrast ordered  PUD Prophylaxis : PPI  DVT Prophylaxis  :   Heparin   Lab Results  Component Value Date   PLT 323 01/12/2020    Inpatient Medications  Scheduled Meds: . allopurinol  100 mg Oral BID  . amLODipine  5 mg Oral Daily  . carvedilol  6.25 mg Oral BID WC  . Chlorhexidine Gluconate Cloth  6 each Topical Daily  . Chlorhexidine Gluconate Cloth  6 each Topical Q0600  . [START ON 01/14/2020] cinacalcet  90 mg Oral Q M,W,F  . dexamethasone (DECADRON) injection  6 mg Intravenous Q24H  . [START ON 01/14/2020] diphenhydrAMINE  25 mg Oral Q M,W,F  . ferric citrate  420 mg Oral BID WC  .  furosemide  40 mg Oral Daily  . heparin  5,000 Units Subcutaneous Q8H  . insulin aspart  0-15 Units Subcutaneous TID WC  . pantoprazole  40 mg Oral Daily  . pravastatin  20 mg Oral Daily   Continuous Infusions: . azithromycin Stopped (01/11/20 2235)  . chlorproMAZINE (THORAZINE) IV    . remdesivir 100 mg in NS 100 mL 100 mg (01/12/20 0951)   PRN Meds:.acetaminophen, chlorproMAZINE (THORAZINE) IV, mirtazapine, ondansetron  Antibiotics  :    Anti-infectives (From admission, onward)   Start     Dose/Rate Route Frequency Ordered Stop   01/12/20 1000  remdesivir 100 mg in sodium chloride 0.9 % 100 mL IVPB     100 mg 200 mL/hr over 30 Minutes Intravenous Daily 01/11/20 1943 01/16/20 0959   01/12/20 0100  cefTRIAXone (ROCEPHIN) 1 g in sodium chloride 0.9 % 100 mL IVPB  Status:  Discontinued     1 g 200 mL/hr  over 30 Minutes Intravenous Daily at bedtime 01/11/20 1903 01/12/20 1204   01/11/20 2030  remdesivir 100 mg in sodium chloride 0.9 % 100 mL IVPB  Status:  Discontinued     100 mg 200 mL/hr over 30 Minutes Intravenous Every 1 hr x 2 01/11/20 1942 01/11/20 1944   01/11/20 2000  azithromycin (ZITHROMAX) 500 mg in sodium chloride 0.9 % 250 mL IVPB     500 mg 250 mL/hr over 60 Minutes Intravenous Every 24 hours 01/11/20 1903     01/11/20 2000  remdesivir 200 mg in sodium chloride 0.9% 250 mL IVPB     200 mg 580 mL/hr over 30 Minutes Intravenous Once 01/11/20 1945 01/11/20 2130   01/11/20 1730  vancomycin (VANCOCIN) 2,500 mg in sodium chloride 0.9 % 500 mL IVPB     2,500 mg 250 mL/hr over 120 Minutes Intravenous  Once 01/11/20 1657 01/11/20 2005   01/11/20 1715  ceFEPIme (MAXIPIME) 1 g in sodium chloride 0.9 % 100 mL IVPB     1 g 200 mL/hr over 30 Minutes Intravenous  Once 01/11/20 1652 01/11/20 1806       Time Spent in minutes  30   Lala Lund M.D on 01/12/2020 at 12:04 PM  To page go to [email or web address] - password [facility]  [facility] -  Office  [phone]  See all Orders from today for further details    Objective:   Vitals:   01/12/20 0413 01/12/20 0529 01/12/20 0534 01/12/20 1200  BP:  (!) 130/117 (!) 111/53   Pulse: 85 79 78   Resp:  20 (!) 21   Temp:  98.6 F (37 C)  97.7 F (36.5 C)  TempSrc:  Axillary  Oral  SpO2: 100% 98% 97%   Weight:  132.2 kg    Height:        Wt Readings from Last 3 Encounters:  01/12/20 132.2 kg  01/11/20 128 kg  12/25/19 134.7 kg     Intake/Output Summary (Last 24 hours) at 01/12/2020 1204 Last data filed at 01/12/2020 0900 Gross per 24 hour  Intake 941.68 ml  Output 2 ml  Net 939.68 ml     Physical Exam  Awake Alert, No new F.N deficits, Normal affect [place].AT,PERRAL Supple Neck,No JVD, No cervical lymphadenopathy appriciated.  Symmetrical Chest wall movement, Good air movement bilaterally, CTAB RRR,No Gallops,Rubs or  new Murmurs, No Parasternal Heave +ve B.Sounds, Abd Soft, No tenderness, No organomegaly appriciated, No rebound - guarding or rigidity. No Cyanosis, L foot under UNNA boot for charcot's joint, small  shallow noninfected right lower extremity lateral 1 cm in diameter ulcer        Data Review:    CBC Recent Labs  Lab 01/11/20 0333 01/11/20 1610 01/12/20 0849  WBC 23.7* 34.5* 36.5*  HGB 10.5* 9.0* 8.8*  HCT 33.5* 28.7* 27.6*  PLT 383 303 323  MCV 76.7* 75.3* 74.6*  MCH 24.0* 23.6* 23.8*  MCHC 31.3 31.4 31.9  RDW 18.4* 17.9* 17.4*  LYMPHSABS 0.9 1.0  --   MONOABS 1.2* 1.5*  --   EOSABS 0.1 0.0  --   BASOSABS 0.1 0.1  --     Chemistries  Recent Labs  Lab 01/11/20 0333 01/11/20 1610 01/11/20 1756 01/12/20 0849  NA 128* 130*  --  129*  K 6.1* 5.3*  --  4.8  CL 91* 90*  --  91*  CO2 16* 22  --  19*  GLUCOSE 120* 135*  --  209*  BUN 83* 57*  --  66*  CREATININE 22.62* 16.89*  --  17.51*  CALCIUM 9.1 8.5*  --  8.6*  MG  --   --   --  1.9  AST 15  --  18  --   ALT 10  --  10  --   ALKPHOS 91  --  84  --   BILITOT 0.9  --  0.6  --    ------------------------------------------------------------------------------------------------------------------ Recent Labs    01/11/20 1756  TRIG 235*    Lab Results  Component Value Date   HGBA1C 10.5 (H) 08/08/2018   ------------------------------------------------------------------------------------------------------------------ No results for input(s): TSH, T4TOTAL, T3FREE, THYROIDAB in the last 72 hours.  Invalid input(s): FREET3  Cardiac Enzymes No results for input(s): CKMB, TROPONINI, MYOGLOBIN in the last 168 hours.  Invalid input(s): CK ------------------------------------------------------------------------------------------------------------------    Component Value Date/Time   BNP 320.2 (H) 01/12/2020 0938    Micro Results Recent Results (from the past 240 hour(s))  SARS CORONAVIRUS 2 (TAT 6-24 HRS)  Nasopharyngeal Nasopharyngeal Swab     Status: None   Collection Time: 01/08/20 10:10 AM   Specimen: Nasopharyngeal Swab  Result Value Ref Range Status   SARS Coronavirus 2 NEGATIVE NEGATIVE Final    Comment: (NOTE) SARS-CoV-2 target nucleic acids are NOT DETECTED. The SARS-CoV-2 RNA is generally detectable in upper and lower respiratory specimens during the acute phase of infection. Negative results do not preclude SARS-CoV-2 infection, do not rule out co-infections with other pathogens, and should not be used as the sole basis for treatment or other patient management decisions. Negative results must be combined with clinical observations, patient history, and epidemiological information. The expected result is Negative. Fact Sheet for Patients: SugarRoll.be Fact Sheet for Healthcare Providers: https://www.woods-mathews.com/ This test is not yet approved or cleared by the [place] FDA and  has been authorized for detection and/or diagnosis of SARS-CoV-2 by FDA under an Emergency Use Authorization (EUA). This EUA will remain  in effect (meaning this test can be used) for the duration of the COVID-19 declaration under Section 56 4(b)(1) of the Act, 21 U.S.C. section 360bbb-3(b)(1), unless the authorization is terminated or revoked sooner. Performed at [facility], [place] [address] [place], [place] [address]   Respiratory Panel by RT PCR (Flu A&B, Covid) - Nasopharyngeal Swab     Status: Abnormal   Collection Time: 01/11/20  4:15 PM   Specimen: Nasopharyngeal Swab  Result Value Ref Range Status   SARS Coronavirus 2 by RT PCR POSITIVE (A) NEGATIVE Final    Comment: RESULT  CALLED TO, READ BACK BY AND VERIFIED WITH: [place] 2229 01/11/20 JM (NOTE) SARS-CoV-2 target nucleic acids are DETECTED. SARS-CoV-2 RNA is generally detectable in upper respiratory specimens  during the acute phase of infection. Positive results are  indicative of the presence of the identified virus, but do not rule out bacterial infection or co-infection with other pathogens not detected by the test. Clinical correlation with patient history and other diagnostic information is necessary to determine patient infection status. The expected result is Negative. Fact Sheet for Patients:  PinkCheek.be Fact Sheet for Healthcare Providers: GravelBags.it This test is not yet approved or cleared by the [place] FDA and  has been authorized for detection and/or diagnosis of SARS-CoV-2 by FDA under an Emergency Use Authorization (EUA).  This EUA will remain in effect (meaning this test can be used) for th e duration of  the COVID-19 declaration under Section 564(b)(1) of the Act, 21 U.S.C. section 360bbb-3(b)(1), unless the authorization is terminated or revoked sooner.    Influenza A by PCR NEGATIVE NEGATIVE Final   Influenza B by PCR NEGATIVE NEGATIVE Final    Comment: (NOTE) The Xpert Xpress SARS-CoV-2/FLU/RSV assay is intended as an aid in  the diagnosis of influenza from Nasopharyngeal swab specimens and  should not be used as a sole basis for treatment. Nasal washings and  aspirates are unacceptable for Xpert Xpress SARS-CoV-2/FLU/RSV  testing. Fact Sheet for Patients: PinkCheek.be Fact Sheet for Healthcare Providers: GravelBags.it This test is not yet approved or cleared by the [place] FDA and  has been authorized for detection and/or diagnosis of SARS-CoV-2 by  FDA under an Emergency Use Authorization (EUA). This EUA will remain  in effect (meaning this test can be used) for the duration of the  Covid-19 declaration under Section 564(b)(1) of the Act, 21  U.S.C. section 360bbb-3(b)(1), unless the authorization is  terminated or revoked. Performed at [facility], [place] [address] [place], [place] [address]   Culture, blood (routine x 2)     Status: None (Preliminary result)   Collection Time: 01/11/20  4:52 PM   Specimen: BLOOD  Result Value Ref Range Status   Specimen Description   Final    BLOOD RIGHT ANTECUBITAL Performed at [place] [address] [place], [place] [address]    Special Requests   Final    BOTTLES DRAWN AEROBIC AND ANAEROBIC Blood Culture adequate volume Performed at [place] [address], [place], [place] [address]    Culture  Setup Time   Final    GRAM POSITIVE COCCI IN CHAINS AEROBIC BOTTLE ONLY CRITICAL RESULT CALLED TO, READ BACK BY AND VERIFIED WITH: PHARMD L CHEN [id number] AT 1011 BY CM Performed at [facility], [place] [address] [place], [place] [address]    Culture GRAM POSITIVE COCCI IN CHAINS  Final   Report Status PENDING  Incomplete  Culture, blood (routine x 2)     Status: None (Preliminary result)   Collection Time: 01/11/20  5:46 PM   Specimen: BLOOD LEFT HAND  Result Value Ref Range Status   Specimen Description   Final    BLOOD LEFT HAND Performed at [place] [address], [place], [place] [address]    Special Requests   Final    BOTTLES DRAWN AEROBIC ONLY Blood Culture adequate volume Performed at [place] [address] [place],  [address]    Culture   Final  NO GROWTH < 12 HOURS Performed at [place] [address], [place], [place] [address]    Report Status PENDING  Incomplete  MRSA PCR Screening     Status: None   Collection Time: 01/12/20  2:28 AM   Specimen: Nasal Mucosa; Nasopharyngeal  Result Value Ref Range Status   MRSA by PCR NEGATIVE NEGATIVE Final    Comment:        The GeneXpert MRSA Assay (FDA approved for NASAL specimens only), is one component of a comprehensive MRSA colonization surveillance program. It is not intended to diagnose MRSA infection nor to guide  or monitor treatment for MRSA infections. Performed at [facility], [place] [address] [place], [place] [address]     Radiology Reports DG Chest Gamaliel 1 View  Result Date: 01/12/2020 CLINICAL DATA:  Short of breath EXAM: PORTABLE CHEST 1 VIEW COMPARISON:  01/11/2020 FINDINGS: Right upper lobe airspace disease unchanged. Improved aeration in the lung bases with improved lung volume. Negative for edema or effusion. Central venous catheter tip in the right atrium unchanged. IMPRESSION: Persistent right upper lobe airspace disease unchanged Improved aeration lungs with decrease in bibasilar atelectasis. Electronically Signed   By: Franchot Gallo M.D.   On: 01/12/2020 09:16   DG Chest Port 1 View  Result Date: 01/11/2020 CLINICAL DATA:  Shortness of breath EXAM: PORTABLE CHEST 1 VIEW COMPARISON:  01/10/2019 FINDINGS: There is bilateral diffuse interstitial thickening. There is a nodular appearance along the right suprahilar region likely reflecting prominent pulmonary vasculature or airspace disease. There is no pleural effusion or pneumothorax. The heart mediastinum are stable. There is a large bore left-sided central venous catheter with the tip projecting over the cavoatrial junction. There is no acute osseous abnormality. IMPRESSION: Diffuse bilateral interstitial thickening likely reflecting interstitial edema. Nodular appearance along the right suprahilar region likely reflecting prominent pulmonary vasculature or airspace disease. Electronically Signed   By: Kathreen Devoid   On: 01/11/2020 16:31   DG Chest Port 1 View  Result Date: 01/11/2020 CLINICAL DATA:  Cough, short of breath EXAM: PORTABLE CHEST 1 VIEW COMPARISON:  03/07/2017 FINDINGS: Single frontal view of the chest demonstrates a left internal jugular dialysis catheter tip overlying atrial caval junction. Vascular stent overlies the right axilla. Cardiac silhouette is stable. There is a 7.3 cm mass projecting over the right anterior  first and second ribs. CT chest with IV contrast recommended for suspected neoplasm. There is central vascular congestion with diffuse interstitial prominence consistent with mild fluid overload. Right hilar prominence could also reflect lymphadenopathy. No effusion or pneumothorax. No acute bony abnormality. IMPRESSION: 1. 7.3 cm right upper lobe mass consistent with neoplasm. CT chest with IV contrast is recommended. 2. Right hilar prominence could reflect underlying adenopathy. 3. Vascular congestion and interstitial edema. Electronically Signed   By: Randa Ngo M.D.   On: 01/11/2020 04:02   VAS [place] UPPER EXTREMITY ARTERIAL DUPLEX  Result Date: 12/25/2019 UPPER EXTREMITY DUPLEX STUDY Indications: Pre-operative exam.  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 Bilateral testing is considered an integral part of a complete examination. Limited examinations for reoccurring indications may be performed as noted.  Right Pre-Dialysis Findings: +-----------------------+----------+--------------------+---------+--------+ Location  PSV (cm/s)Intralum. Diam. (cm)Waveform Comments +-----------------------+----------+--------------------+---------+--------+ Brachial Antecub. fossa40        0.46                triphasic         +-----------------------+----------+--------------------+---------+--------+ Radial Art at Wrist    35        0.29                triphasic         +-----------------------+----------+--------------------+---------+--------+ Ulnar Art at Wrist     81        0.27                triphasic          +-----------------------+----------+--------------------+---------+--------+ +------------+-----------------+ Allen's TestBranches at fossa +------------+-----------------+ Left Pre-Dialysis Findings: +-----------------------+----------+--------------------+---------+--------+ Location               PSV (cm/s)Intralum. Diam. (cm)Waveform Comments +-----------------------+----------+--------------------+---------+--------+ Brachial Antecub. fossa54        0.52                triphasic         +-----------------------+----------+--------------------+---------+--------+ Radial Art at Wrist    44        0.27                triphasictortuous +-----------------------+----------+--------------------+---------+--------+ Ulnar Art at Wrist     73        0.19                triphasic         +-----------------------+----------+--------------------+---------+--------+ +------------+-----------------+ Allen's TestBranches at fossa +------------+-----------------+  Summary:   Measurements above. *See table(s) above for measurements and observations. Electronically signed by Curt Jews MD on 12/25/2019 at 9:30:15 AM.    Final    VAS [place] UPPER EXTREMITY VEIN MAPPING  Result Date: 12/25/2019 UPPER EXTREMITY VEIN MAPPING  Indications: Pre-access. History: History of right Radiocephalic fistula years ago.  Performing Technologist: Alvia Grove RVT  Examination Guidelines: A complete evaluation includes B-mode imaging, spectral Doppler, color Doppler, and power Doppler as needed of all accessible portions of each vessel. Bilateral testing is considered an integral part of a complete examination. Limited examinations for reoccurring indications may be performed as noted. +-----------------+-------------+----------+--------+ Right Cephalic   Diameter (cm)Depth (cm)Findings +-----------------+-------------+----------+--------+ Prox upper arm                             [place]     +-----------------+-------------+----------+--------+ Mid upper arm                              [place]    +-----------------+-------------+----------+--------+ Dist upper arm                             [place]    +-----------------+-------------+----------+--------+ Antecubital fossa                          [place]    +-----------------+-------------+----------+--------+ +-----------------+-------------+----------+---------+ Right Basilic    Diameter (cm)Depth (cm)Findings  +-----------------+-------------+----------+---------+ Prox upper arm       0.46                         +-----------------+-------------+----------+---------+ Mid upper arm        0.68  branching +-----------------+-------------+----------+---------+ Dist upper arm       0.34                         +-----------------+-------------+----------+---------+ Antecubital fossa    0.31                         +-----------------+-------------+----------+---------+ Prox forearm         0.27                         +-----------------+-------------+----------+---------+ +-----------------+-------------+----------+--------+ Left Cephalic    Diameter (cm)Depth (cm)Findings +-----------------+-------------+----------+--------+ Shoulder             0.47                        +-----------------+-------------+----------+--------+ Prox upper arm       0.36                        +-----------------+-------------+----------+--------+ Mid upper arm        0.41                        +-----------------+-------------+----------+--------+ Dist upper arm       0.43                        +-----------------+-------------+----------+--------+ Antecubital fossa    0.54                        +-----------------+-------------+----------+--------+ Prox forearm         0.43                        +-----------------+-------------+----------+--------+ Mid forearm          0.38                         +-----------------+-------------+----------+--------+ Dist forearm         0.33                        +-----------------+-------------+----------+--------+ +-----------------+-------------+----------+--------+ Left Basilic     Diameter (cm)Depth (cm)Findings +-----------------+-------------+----------+--------+ Prox upper arm       0.48                        +-----------------+-------------+----------+--------+ Mid upper arm        0.51                        +-----------------+-------------+----------+--------+ Dist upper arm       0.47                        +-----------------+-------------+----------+--------+ Antecubital fossa    0.26                        +-----------------+-------------+----------+--------+ Prox forearm         0.25                        +-----------------+-------------+----------+--------+ Summary: Right: Occluded right forearm fistula. Thrombus in the cephalic vein        from the antecubital fossa extending proximally into the  subclavian vein, appearing chronic. Left: Measurements above. *See table(s) above for measurements and observations.  Diagnosing physician: Curt Jews MD Electronically signed by Curt Jews MD on 12/25/2019 at 9:27:41 AM.    Final

## 2020-01-12 NOTE — Consult Note (Signed)
Renal Service Consult Note [facility]  [name] 01/12/2020 Sol Blazing Requesting Physician:  Dr Candiss Norse, P  Reason for Consult:  ESRD pt w/ COVID PNA HPI: The patient is a 64 y.o. year-old w/ hx of HTN, tobacco use, OSA, obesity, HTN, HL, gout, DM2 on insulin and eSRD on HD presented to ED on 2/26 w/ SOB, nausea, vomiting and sore throat. SpO2 was in the low 80's.  Pt missed HD on Wed but had HD on Friday, however felt worse after HD so came to ED.  In ED temp 99.2, tachycardia and tachypnea, required 3L [place], wbc 34K, hb 8 and K 5.3, creat 16 (baseline 14).  COVID was pcr +.  Admitted and started on IV vanc/ cefepime for poss bact PNA. Pt has HD port in the L chest. CXR showed B perihilar densities c/w pna vs edema. Asked to see for ESRD.    Blood cx's are growing GPC but only in 1/2 bottles. Pt is on IV vanc, cefepime and IV remdesivir and decadron for 5d.  Pt seen in room post HD. No c/o.  States his AVF "clotted at the [place]" and now is using L chest catheter for HD.    ROS  denies CP  no joint pain   no HA  no blurry vision  no rash  no diarrhea  no nausea/ vomiting    Past Medical History  Past Medical History:  Diagnosis Date  . Alcohol abuse   . Congestive heart disease ([place]) 12/2011  . Diabetes mellitus   . GERD (gastroesophageal reflux disease)   . Gout   . Headache   . Hepatitis    being treated for Hepatitis C  . HOH (hard of hearing)   . Hyperlipidemia   . Hyperlipidemia   . Hypertension   . Monoclonal gammopathies 02/25/2012  . Obesity   . Pedal edema   . Pneumonia   . Renal insufficiency   . Sleep apnea    uses cpap  . Tobacco abuse   . Wears hearing aid    b/l   Past Surgical History  Past Surgical History:  Procedure Laterality Date  . AV FISTULA PLACEMENT Right 01/10/2014   Procedure: ARTERIOVENOUS (AV) FISTULA CREATION- RIGHT RADIOCEPHALIC ;  Surgeon: Angelia Mould, MD;  Location: [place];  Service: Vascular;   Laterality: Right;  . COLONOSCOPY    . EYE SURGERY     cataract surgery, bilateral  . Ganglion cyst removal x 2    . INSERTION OF DIALYSIS CATHETER N/A 01/07/2014   Procedure: INSERTION OF DIALYSIS CATHETER;  Surgeon: Conrad Byron, MD;  Location: [place];  Service: Vascular;  Laterality: N/A;  . Left knee arthroscopy with generalized joint synovectomy,  01/2006  . LIGATION OF COMPETING BRANCHES OF ARTERIOVENOUS FISTULA Right 05/28/2014   Procedure: LIGATION OF COMPETING BRANCHES OF ARTERIOVENOUS FISTULA;  Surgeon: Angelia Mould, MD;  Location: [place];  Service: Vascular;  Laterality: Right;  . REVISON OF ARTERIOVENOUS FISTULA Right 01/19/1061   Procedure: PLICATION REPAIR OF ARTERIOVENOUS FISTULA PSEUDOANEURYSM;  Surgeon: Serafina Mitchell, MD;  Location: [place];  Service: Vascular;  Laterality: Right;  . REVISON OF ARTERIOVENOUS FISTULA Right 08/08/2018   Procedure: REVISION PLICATION OF ARTERIOVENOUS FISTULA FOREARM;  Surgeon: Angelia Mould, MD;  Location: [place];  Service: Vascular;  Laterality: Right;  . REVISON OF ARTERIOVENOUS FISTULA Right 08/02/2019   Procedure: Artegraft placement right arm arteriovenous graft;  Surgeon: Waynetta Sandy, MD;  [facility];  Service: Vascular;  Laterality: Right;  . SCROTAL EXPLORATION N/A 03/11/2017   Procedure: SCROTUM EXPLORATION AND DEBRIDEMENT;  Surgeon: Franchot Gallo, MD;  Location: [place];  Service: Urology;  Laterality: N/A;  . SCROTAL EXPLORATION N/A 03/21/2017   Procedure: REPEAT SCROTAL DEBRIDEMENT;  Surgeon: Franchot Gallo, MD;  Location: [place];  Service: Urology;  Laterality: N/A;  . SHUNTOGRAM Right 05/14/2014   Procedure: FISTULOGRAM;  Surgeon: Serafina Mitchell, MD;  Location: [facility] CATH LAB;  Service: Cardiovascular;  Laterality: Right;  . THROMBECTOMY W/ EMBOLECTOMY Right 07/04/2017   Procedure: EXPLORATION AND THROMBECTOMY ARTERIOVENOUS FISTULA RIGHT ARM;  Surgeon: Rosetta Posner, MD;  Location: [facility];  Service: Vascular;   Laterality: Right;   Family History  Family History  Problem Relation Age of Onset  . Diabetes Mother   . Heart disease Mother   . Lung cancer Father    Social History  reports that he quit smoking about 9 years ago. His smoking use included cigarettes. He has a 9.90 pack-year smoking history. He has never used smokeless tobacco. He reports that he does not drink alcohol or use drugs. Allergies  Allergies  Allergen Reactions  . Penicillin G   . Penicillins Nausea And Vomiting and Other (See Comments)    Has patient had a PCN reaction causing immediate rash, facial/tongue/throat swelling, SOB or lightheadedness with hypotension: No Has patient had a PCN reaction causing severe rash involving mucus membranes or skin necrosis: No Has patient had a PCN reaction that required hospitalization No Has patient had a PCN reaction occurring within the last 10 years: No If all of the above answers are "NO", then may proceed with Cephalosporin use.  Marland Kitchen Propolis Nausea And Vomiting  . Vioxx [Rofecoxib] Nausea And Vomiting   Home medications Prior to Admission medications   Medication Sig Start Date End Date Taking? Authorizing Provider  acetaminophen (TYLENOL) 325 MG tablet Take 650 mg by mouth every 6 (six) hours as needed for mild pain, moderate pain, fever or headache.    Yes [provider]  allopurinol (ZYLOPRIM) 100 MG tablet Take 1 tablet (100 mg total) by mouth daily. Patient taking differently: Take 100 mg by mouth 2 (two) times daily.  03/14/17  Yes Rai, Ripudeep K, MD  amLODipine (NORVASC) 5 MG tablet Take 5 mg by mouth daily.   Yes [provider]  AURYXIA 1 GM 210 MG(Fe) tablet Take 420 mg by mouth 2 (two) times daily with a meal.  07/14/18  Yes [provider]  carvedilol (COREG) 6.25 MG tablet Take 6.25 mg by mouth 2 (two) times daily with a meal.   Yes [provider]  cinacalcet (SENSIPAR) 90 MG tablet Take 90 mg by mouth every Monday, Wednesday,  and Friday.    Yes [provider]  diphenhydrAMINE (BENADRYL) 25 MG tablet Take 25 mg by mouth every Monday, Wednesday, and Friday.   Yes [provider]  furosemide (LASIX) 40 MG tablet Take 40 mg by mouth daily.   Yes [provider]  Insulin Detemir (LEVEMIR FLEXTOUCH) 100 UNIT/ML Pen Inject 20 Units into the skin at bedtime.    Yes [provider]  mirtazapine (REMERON) 15 MG tablet Take 15 mg by mouth at bedtime as needed (sleep).   Yes [provider]  omeprazole (PRILOSEC) 20 MG capsule Take 20 mg by mouth daily.  08/03/19  Yes [provider]  ondansetron (ZOFRAN) 4 MG tablet Take 4 mg by mouth 3 (three) times daily  as needed for nausea/vomiting. 01/07/20  Yes [provider]  pravastatin (PRAVACHOL) 20 MG tablet Take 20 mg by mouth daily.    Yes [provider]  clotrimazole (LOTRIMIN) 1 % cream Apply to both feet and between toes twice daily Patient not taking: Reported on 01/11/2020 10/19/18   Marzetta Board, DPM  HYDROcodone-acetaminophen (NORCO) 5-325 MG tablet Take 1 tablet by mouth every 6 (six) hours as needed for moderate pain. Patient not taking: Reported on 01/11/2020 08/02/19   Dagoberto Ligas, PA-C     Vitals:   01/12/20 1530 01/12/20 1600 01/12/20 1619 01/12/20 1630  BP: (!) 129/43 (!) 114/57 (!) 78/51 (!) 102/51  Pulse: 81 78 73 70  Resp:      Temp:      TempSrc:      SpO2:      Weight:      Height:       Exam Gen alert, obese, pleasant, no distress No rash, cyanosis or gangrene Sclera anicteric, throat clear  No jvd or bruits  Chest clear bilat to bases, no rales / wheezing RRR no MRG Abd soft ntnd no mass or ascites +bs GU normal male  MS no joint effusions or deformity Ext no LE or UE edema, no wounds or ulcers Neuro is alert, Ox 3 , nf    Home meds:  - amlodipine 5/ carvedilol 6.25 bid/ furosemide 40 qd  - pravastatin 20  - insulin detemir 20hu sq hs  - allopurinol 100 bid/  omeprazole 40 qd  - prn hydrocodone-aceta/ mirtazapine 15mg  hs prn   - cinacalcet 90 mwf/ auryxia 420mg  ac bid  - prn's/ vitamins/ supplements     Outpt HD: [facility]   5h  550/800   132kg  2/2 bath  P4  AVG  Hep 10K+ 2K midrun   Calcitriol 3.25 ug tiw   sensipar 180 qhd   venofer 50 /wk   mirc 60 q 2, last 2/22     Assessment/ Plan: 1. COVID + PNA: +CXR infiltrates , [place]. Looks stable on [place] O2.  2. ESRD: on HD MWF, missed Wed HD this week. Had 3h HD today off schedule, next HD Monday.  3. HTN/ volume: BP's good, didn't tol much UF w/ BP drop today , UF 1.5.   No vol excess on exam. Under dry wt slightly. 4. IDDM 5. Anemia ckd: Hb 8.8, last esa 6d ago, due on 3/8    Rob Akon Reinoso  MD 01/12/2020, 4:53 PM  Recent Labs  Lab 01/11/20 1610 01/12/20 0849  WBC 34.5* 36.5*  HGB 9.0* 8.8*   Recent Labs  Lab 01/11/20 0333 01/11/20 0333 01/11/20 1610 01/12/20 0849  K 6.1*   < > 5.3* 4.8  BUN 83*   < > 57* 66*  CREATININE 22.62*   < > 16.89* 17.51*  CALCIUM 9.1  --  8.5* 8.6*   < > = values in this interval not displayed.

## 2020-01-13 ENCOUNTER — Inpatient Hospital Stay (HOSPITAL_COMMUNITY): Payer: Medicare Other

## 2020-01-13 LAB — COMPREHENSIVE METABOLIC PANEL
ALT: 10 U/L (ref 0–44)
AST: 13 U/L — ABNORMAL LOW (ref 15–41)
Albumin: 2.4 g/dL — ABNORMAL LOW (ref 3.5–5.0)
Alkaline Phosphatase: 88 U/L (ref 38–126)
Anion gap: 18 — ABNORMAL HIGH (ref 5–15)
BUN: 46 mg/dL — ABNORMAL HIGH (ref 8–23)
CO2: 22 mmol/L (ref 22–32)
Calcium: 9.4 mg/dL (ref 8.9–10.3)
Chloride: 93 mmol/L — ABNORMAL LOW (ref 98–111)
Creatinine, Ser: 12.16 mg/dL — ABNORMAL HIGH (ref 0.61–1.24)
GFR calc Af Amer: 5 mL/min — ABNORMAL LOW (ref >60)
GFR calc non Af Amer: 4 mL/min — ABNORMAL LOW (ref >60)
Glucose, Bld: 210 mg/dL — ABNORMAL HIGH (ref 70–99)
Potassium: 4.1 mmol/L (ref 3.5–5.1)
Sodium: 133 mmol/L — ABNORMAL LOW (ref 135–145)
Total Bilirubin: 0.6 mg/dL (ref 0.3–1.2)
Total Protein: 7.3 g/dL (ref 6.5–8.1)

## 2020-01-13 LAB — CBC WITH DIFFERENTIAL/PLATELET
Abs Immature Granulocytes: 1.12 10*3/uL — ABNORMAL HIGH (ref 0.00–0.07)
Basophils Absolute: 0.1 10*3/uL (ref 0.0–0.1)
Basophils Relative: 0 %
Eosinophils Absolute: 0 10*3/uL (ref 0.0–0.5)
Eosinophils Relative: 0 %
HCT: 28.3 % — ABNORMAL LOW (ref 39.0–52.0)
Hemoglobin: 8.9 g/dL — ABNORMAL LOW (ref 13.0–17.0)
Immature Granulocytes: 4 %
Lymphocytes Relative: 2 %
Lymphs Abs: 0.6 10*3/uL — ABNORMAL LOW (ref 0.7–4.0)
MCH: 23.4 pg — ABNORMAL LOW (ref 26.0–34.0)
MCHC: 31.4 g/dL (ref 30.0–36.0)
MCV: 74.5 fL — ABNORMAL LOW (ref 80.0–100.0)
Monocytes Absolute: 1 10*3/uL (ref 0.1–1.0)
Monocytes Relative: 3 %
Neutro Abs: 27 10*3/uL — ABNORMAL HIGH (ref 1.7–7.7)
Neutrophils Relative %: 91 %
Platelets: 328 10*3/uL (ref 150–400)
RBC: 3.8 MIL/uL — ABNORMAL LOW (ref 4.22–5.81)
RDW: 17.4 % — ABNORMAL HIGH (ref 11.5–15.5)
WBC: 29.8 10*3/uL — ABNORMAL HIGH (ref 4.0–10.5)
nRBC: 0.1 % (ref 0.0–0.2)

## 2020-01-13 LAB — GLUCOSE, CAPILLARY
Glucose-Capillary: 190 mg/dL — ABNORMAL HIGH (ref 70–99)
Glucose-Capillary: 261 mg/dL — ABNORMAL HIGH (ref 70–99)
Glucose-Capillary: 171 mg/dL — ABNORMAL HIGH (ref 70–99)
Glucose-Capillary: 195 mg/dL — ABNORMAL HIGH (ref 70–99)

## 2020-01-13 LAB — D-DIMER, QUANTITATIVE: D-Dimer, Quant: 6.32 ug/mL-FEU — ABNORMAL HIGH (ref 0.00–0.50)

## 2020-01-13 LAB — C-REACTIVE PROTEIN: CRP: 32 mg/dL — ABNORMAL HIGH (ref <1.0)

## 2020-01-13 LAB — MAGNESIUM: Magnesium: 2.1 mg/dL (ref 1.7–2.4)

## 2020-01-13 LAB — PROCALCITONIN: Procalcitonin: 28 ng/mL

## 2020-01-13 LAB — BRAIN NATRIURETIC PEPTIDE: B Natriuretic Peptide: 307.3 pg/mL — ABNORMAL HIGH (ref 0.0–100.0)

## 2020-01-13 MED ORDER — CHLORHEXIDINE GLUCONATE CLOTH 2 % EX PADS
6.0000 | MEDICATED_PAD | Freq: Every day | CUTANEOUS | Status: DC
  Administered 2020-01-14: 6 via TOPICAL

## 2020-01-13 MED ORDER — VANCOMYCIN HCL IN DEXTROSE 1-5 GM/200ML-% IV SOLN
1000.0000 mg | Freq: Once | INTRAVENOUS | Status: AC
  Administered 2020-01-13: 1000 mg via INTRAVENOUS
  Filled 2020-01-13: qty 200

## 2020-01-13 NOTE — Progress Notes (Signed)
[facility] Progress Note  Subjective: chart reviewed, wbc down 29kg, temps okay, BUN / creat down some 46/ 12.5  Vitals:   01/13/20 0400 01/13/20 0600 01/13/20 0813 01/13/20 1236  BP: 132/75 140/81  107/63  Pulse: (!) 58 64  72  Resp: (!) 24 17  17   Temp:  98.9 F (37.2 C) 97.7 F (36.5 C) 97.6 F (36.4 C)  TempSrc:  Oral Oral Oral  SpO2: 97% 97%  100%  Weight:      Height:        Exam:  Patient not examined today directly given COVID-19 + status, utilizing data taken from chart +/- discussions w/ providers and staff.      Home meds:  - amlodipine 5/ carvedilol 6.25 bid/ furosemide 40 qd  - pravastatin 20  - insulin detemir 20hu sq hs  - allopurinol 100 bid/ omeprazole 40 qd  - prn hydrocodone-aceta/ mirtazapine 15mg  hs prn   - cinacalcet 90 mwf/ auryxia 420mg  ac bid  - prn's/ vitamins/ supplements     [facility]pt HD: [facility]   5h  550/800   132kg  2/2 bath  P4  AVG  Hep 10K+ 2K midrun   Calcitriol 3.25 ug tiw   sensipar 180 qhd   venofer 50 /wk   mirc 60 q 2, last 2/22     Assessment/ Plan: 1. COVID + PNA: +CXR infiltrates , ^^wbc 2. ESRD: on HD MWF. Had HD here yest 3h, plan HD tomorrow.  3. HTN/ volume: BP dropped w/ attempted UF yest and was under dry. Small UF w HD tomorrow.  4. IDDM 5. Anemia ckd: Hb 8.8, last esa 6d ago, due on 3/8      Rob Miah Boye 01/13/2020, 1:01 PM   Recent Labs  Lab 01/12/20 0849 01/13/20 0638  K 4.8 4.1  BUN 66* 46*  CREATININE 17.51* 12.16*  CALCIUM 8.6* 9.4  HGB 8.8* 8.9*   Inpatient medications: . allopurinol  100 mg Oral BID  . amLODipine  5 mg Oral Daily  . carvedilol  6.25 mg Oral BID WC  . Chlorhexidine Gluconate Cloth  6 each Topical Daily  . Chlorhexidine Gluconate Cloth  6 each Topical Q0600  . [START ON 01/14/2020] cinacalcet  90 mg Oral Q M,W,F  . dexamethasone (DECADRON) injection  6 mg Intravenous Q24H  . [START ON 01/14/2020] diphenhydrAMINE  25 mg Oral Q M,W,F  . ferric citrate   420 mg Oral BID WC  . furosemide  40 mg Oral Daily  . heparin injection (subcutaneous)  10,000 Units Subcutaneous Q8H  . insulin aspart  0-15 Units Subcutaneous TID WC  . pantoprazole  40 mg Oral Daily  . pravastatin  20 mg Oral Daily  . vancomycin variable dose per unstable renal function (pharmacist dosing)   Does not apply See admin instructions   . ceFEPime (MAXIPIME) IV 1 g (01/12/20 1903)  . chlorproMAZINE (THORAZINE) IV 25 mg (01/12/20 1949)  . remdesivir 100 mg in NS 100 mL 100 mg (01/13/20 1102)   acetaminophen, chlorproMAZINE (THORAZINE) IV, heparin, mirtazapine, ondansetron

## 2020-01-13 NOTE — Progress Notes (Deleted)
Patient desatted to 88% on 6L HFNC. Patient titrated to 10L HFNC. Provider paged, awaiting response. Report given to [place], RN daytime nurse for handoff and aware awaiting response. Pt not c/o of any s/s of distress at this time.

## 2020-01-13 NOTE — Evaluation (Signed)
Physical Therapy Evaluation Patient Details Name: [name] MRN: [id number] DOB: May 11, 1956 Today's Date: 01/13/2020   History of Present Illness  Pt is a 64 y.o. male admitted 01/11/20 with c/o "not feeling well." Worked revealed COVID-19 infection with PNA. CXR with RUL mass suspicious for postobstructive bacterial PNA. PMH includes HTN, OSA, DM2, ESRD (HF MWF), Hep-C, pt reports R charcot foot deformity (wears boot).    Clinical Impression  Pt presents with an overall decrease in functional mobility secondary to above. PTA, pt independent and lives with wife. Today, pt ambulatory in room with RW, although limited by R foot pain secondary to boot for foot deformity being broken and pt unable to wear. Pt with SpO2 >/90% on 2L O2 [place]. Pt reluctant to walk without boot, but unsure good solution for this. Has been sitting EOB often, encouraged to continue sitting upright often. Pt would benefit from continued acute PT services to maximize functional mobility and independence prior to d/c with HHPT services.     Follow Up Recommendations Home health PT;Supervision - Intermittent    Equipment Recommendations  Rolling walker with 5" wheels    Recommendations for Other Services       Precautions / Restrictions Precautions Precautions: Fall Precaution Comments: R charcot foot deformity w/ boot pt wears at baseline (currently broken in room) Restrictions Weight Bearing Restrictions: No      Mobility  Bed Mobility Overal bed mobility: Modified Independent             General bed mobility comments: Received sitting EOB  Transfers Overall transfer level: Modified independent Equipment used: Rolling walker (2 wheeled)             General transfer comment: Mod indep standing from EOB and low toilet height, reliant on UE support  Ambulation/Gait Ambulation/Gait assistance: Supervision Gait Distance (Feet): 28 Feet Assistive device: Rolling walker (2 wheeled) Gait  Pattern/deviations: Step-through pattern;Decreased weight shift to right;Antalgic;Trunk flexed Gait velocity: Decreased Gait velocity interpretation: <1.31 ft/sec, indicative of household ambulator General Gait Details: Slow, antalgic gait with RW, seated rest to void on toilet; pt declining further mobility secondary to R boot broken and foot swelling  Stairs            Wheelchair Mobility    Modified Rankin (Stroke Patients Only)       Balance Overall balance assessment: Needs assistance   Sitting balance-Leahy Scale: Good       Standing balance-Leahy Scale: Poor Standing balance comment: Reliant on UE support                             Pertinent Vitals/Pain Pain Assessment: Faces Faces Pain Scale: Hurts a little bit Pain Location: RLE Pain Descriptors / Indicators: Discomfort Pain Intervention(s): Monitored during session    Home Living Family/patient expects to be discharged to:: Private residence Living Arrangements: Spouse/significant other Available Help at Discharge: Family;Available PRN/intermittently Type of Home: House Home Access: Stairs to enter Entrance Stairs-Rails: Left Entrance Stairs-Number of Steps: 5-6 Home Layout: One level Home Equipment: Crutches      Prior Function Level of Independence: Needs assistance   Gait / Transfers Assistance Needed: Indep ambulation without DME, typically wears R foot for charcot deformity  ADL's / Homemaking Assistance Needed: spouse assists with bathing/dressing, pt sponge bathes        Hand Dominance        Extremity/Trunk Assessment   Upper Extremity Assessment Upper Extremity Assessment: Overall [facility]  for tasks assessed    Lower Extremity Assessment Lower Extremity Assessment: RLE deficits/detail       Communication   Communication: No difficulties  Cognition Arousal/Alertness: Awake/alert Behavior During Therapy: WFL for tasks assessed/performed;Flat affect Overall Cognitive  Status: Within Functional Limits for tasks assessed                                 General Comments: WFL for simple tasks; not formally assessed      General Comments      Exercises     Assessment/Plan    PT Assessment Patient needs continued PT services  PT Problem List Decreased strength;Decreased activity tolerance;Decreased balance;Decreased mobility;Decreased knowledge of use of DME       PT Treatment Interventions DME instruction;Gait training;Stair training;Functional mobility training;Therapeutic activities;Therapeutic exercise;Balance training;Patient/family education    PT Goals (Current goals can be found in the Care Plan section)  Acute Rehab PT Goals Patient Stated Goal: Return home, fix R boot PT Goal Formulation: With patient Time For Goal Achievement: 01/27/20 Potential to Achieve Goals: Good    Frequency Min 3X/week   Barriers to discharge        Co-evaluation               AM-PAC PT "6 Clicks" Mobility  Outcome Measure Help needed turning from your back to your side while in a flat bed without using bedrails?: None Help needed moving from lying on your back to sitting on the side of a flat bed without using bedrails?: None Help needed moving to and from a bed to a chair (including a wheelchair)?: None Help needed standing up from a chair using your arms (e.g., wheelchair or bedside chair)?: None Help needed to walk in hospital room?: A Little Help needed climbing 3-5 steps with a railing? : A Little 6 Click Score: 22    End of Session Equipment Utilized During Treatment: Oxygen Activity Tolerance: Patient tolerated treatment well Patient left: in bed;with call bell/phone within reach Nurse Communication: Mobility status PT Visit Diagnosis: Other abnormalities of gait and mobility (R26.89)    Time: 3748-2707 PT Time Calculation (min) (ACUTE ONLY): 27 min   Charges:   PT Evaluation $PT Eval Moderate Complexity: 1 Mod PT  Treatments $Therapeutic Activity: 8-22 mins       Mabeline Caras, PT, DPT Acute Rehabilitation Services  Pager [phone] Office [place] 01/13/2020, 2:43 PM

## 2020-01-13 NOTE — Progress Notes (Signed)
PROGRESS NOTE                                                                                                                                                                                                             Patient Demographics:    [name], is a 64 y.o. male, DOB - 1956-03-09, HXT:[id number]  Outpatient Primary MD for the patient is [name], MD    LOS - 2  Admit date - 01/11/2020    Chief Complaint  Patient presents with   Emesis   Shortness of Breath   Sore Throat       Brief Narrative  [name] is a 64 y.o. male with medical history significant for  HTN, OSA, chronic Hep C, Type 2 diabetes and ESRD on HD MW/F who presents with concerns of "not feeling well."  His work-up suggested pneumonia with chest x-ray showing right upper lobe mass suspicious for malignancy.  He also had COVID-19 infection and was admitted to the hospital.   Subjective:   Patient in bed, appears comfortable, denies any headache, no fever, no chest pain or pressure, no shortness of breath , no abdominal pain. No focal weakness.  Feels better than yesterday.   Assessment  & Plan :     1. Acute Hypoxic Resp. Failure, positive COVID-19 infection and possible mild pneumonia, also newly diagnosed right upper lobe mass suspicious for postobstructive bacterial pneumonia-   I think his main issue is sepsis from right upper lobe mass with postobstructive bacterial pneumonia, he appears quite toxic and sick, procalcitonin and CRP are both elevated, he is on Decadron which I will continue, continue remdesivir, have consulted pharmacy to place him on vancomycin and cefepime.  Ending CT chest abdomen and pelvis noncontrast, also patient has history of prior right-sided nephrectomy due to renal cancer, question if there is a secondary malignancy in the lung, monitor him closely with his blood cultures and inflammatory markers.  His  condition is quite tenuous.  Have also discussed with pulmonary critical care they will see the patient once CT is done.  Encouraged the patient to sit up in chair in the  daytime use I-S and flutter valve for pulmonary toiletry and then prone in bed when at night.   SpO2: 97 % O2 Flow Rate (L/min): 2 L/min  Recent Labs  Lab 01/08/20 1010 01/11/20 1610 01/11/20 1615 01/11/20 1756 01/12/20 0849 01/13/20 0638  CRP  --  34.4*  --   --  42.4* 32.0*  DDIMER  --  10.25*  --   --  8.79* 6.32*  FERRITIN  --  1,284*  --   --   --   --   BNP  --  103.8*  --   --  320.2* 307.3*  PROCALCITON  --   --   --  22.59 28.29  --   SARSCOV2NAA NEGATIVE  --  POSITIVE*  --   --   --     Hepatic Function Latest Ref Rng & Units 01/13/2020 01/11/2020 01/11/2020  Total Protein 6.5 - 8.1 g/dL 7.3 8.1 8.8(H)  Albumin 3.5 - 5.0 g/dL 2.4(L) 3.2(L) 3.3(L)  AST 15 - 41 U/L 13(L) 18 15  ALT 0 - 44 U/L _0 Alk Phosphatase 38 - 126 U/L 88 84 91  Total Bilirubin 0.3 - 1.2 mg/dL 0.6 0.6 0.9  Bilirubin, Direct 0.0 - 0.2 mg/dL - 0.3(H) -    2.  Right upper lobe mass, history of right sided nephrectomy due to renal cell cancer in the past, history of smoking .  New diagnosis.  See above.  3.  ESRD, Monday Wednesday Friday schedule.  Nephrology consulted.  4.  Anemia of chronic disease.  Hemoglobin close to 8.  Will monitor.  No need for transfusion.  5.  Hyponatremia and hypernatremia.  Management per nephrology and HD.  6.  OSA.  Supplemental oxygen at night, does not use CPAP.  7.  Dyslipidemia.  On statin.  8.  Essential hypertension.  On combination of Norvasc, Coreg and Lasix continue.  Still makes some urine.  9.  Morbid obesity.  BMI 37.  Follow with PCP.  10.  Left foot Charcot joint, right lower extremity shallow 1 cm noninfected ulcer present on admission.  Supportive care.   11.  DM type II.  SSI for now.  Poor outpatient glycemic control due to hyperglycemia, A1c of 10.5.  Diabetic and  insulin education will be provided.  Lab Results  Component Value Date   HGBA1C 10.5 (H) 08/08/2018   CBG (last 3)  Recent Labs    01/12/20 1902 01/12/20 2148 01/13/20 0748  GLUCAP 116* 267* 190*       Condition - Extremely Guarded  Family Communication  : Wife updated 01/13/2020  Code Status : Full code  Diet :    Diet Order            Diet renal with fluid restriction Fluid restriction: 1200 mL Fluid; Room service appropriate? Yes; Fluid consistency: Thin  Diet effective now               Disposition Plan  : Stay in the hospital, is septic with bacterial infection likely postobstructive pneumonia.  Has new lung mass which needs to be worked up highly suspicious for cancer.  Consults  : Pulmonary critical care called  Procedures  :    CT chest, abdomen and pelvis noncontrast ordered  PUD Prophylaxis : PPI  DVT Prophylaxis  :   Heparin   Lab Results  Component Value Date   PLT 328 01/13/2020    Inpatient Medications  Scheduled Meds:  allopurinol  100 mg  Oral BID   amLODipine  5 mg Oral Daily   carvedilol  6.25 mg Oral BID WC   Chlorhexidine Gluconate Cloth  6 each Topical Daily   Chlorhexidine Gluconate Cloth  6 each Topical Q0600   [START ON 01/14/2020] cinacalcet  90 mg Oral Q M,W,F   dexamethasone (DECADRON) injection  6 mg Intravenous Q24H   [START ON 01/14/2020] diphenhydrAMINE  25 mg Oral Q M,W,F   ferric citrate  420 mg Oral BID WC   furosemide  40 mg Oral Daily   heparin injection (subcutaneous)  10,000 Units Subcutaneous Q8H   insulin aspart  0-15 Units Subcutaneous TID WC   pantoprazole  40 mg Oral Daily   pravastatin  20 mg Oral Daily   vancomycin variable dose per unstable renal function (pharmacist dosing)   Does not apply See admin instructions   Continuous Infusions:  ceFEPime (MAXIPIME) IV 1 g (01/12/20 1903)   chlorproMAZINE (THORAZINE) IV 25 mg (01/12/20 1949)   remdesivir 100 mg in NS 100 mL 100 mg (01/13/20  1102)   PRN Meds:.acetaminophen, chlorproMAZINE (THORAZINE) IV, heparin, mirtazapine, ondansetron  Antibiotics  :    Anti-infectives (From admission, onward)   Start     Dose/Rate Route Frequency Ordered Stop   01/12/20 1800  ceFEPIme (MAXIPIME) 1 g in sodium chloride 0.9 % 100 mL IVPB     1 g 200 mL/hr over 30 Minutes Intravenous Every 24 hours 01/12/20 1348     01/12/20 1409  vancomycin variable dose per unstable renal function (pharmacist dosing)      Does not apply See admin instructions 01/12/20 1409     01/12/20 1000  remdesivir 100 mg in sodium chloride 0.9 % 100 mL IVPB     100 mg 200 mL/hr over 30 Minutes Intravenous Daily 01/11/20 1943 01/16/20 0959   01/12/20 0100  cefTRIAXone (ROCEPHIN) 1 g in sodium chloride 0.9 % 100 mL IVPB  Status:  Discontinued     1 g 200 mL/hr over 30 Minutes Intravenous Daily at bedtime 01/11/20 1903 01/12/20 1204   01/11/20 2030  remdesivir 100 mg in sodium chloride 0.9 % 100 mL IVPB  Status:  Discontinued     100 mg 200 mL/hr over 30 Minutes Intravenous Every 1 hr x 2 01/11/20 1942 01/11/20 1944   01/11/20 2000  azithromycin (ZITHROMAX) 500 mg in sodium chloride 0.9 % 250 mL IVPB  Status:  Discontinued     500 mg 250 mL/hr over 60 Minutes Intravenous Every 24 hours 01/11/20 1903 01/12/20 1423   01/11/20 2000  remdesivir 200 mg in sodium chloride 0.9% 250 mL IVPB     200 mg 580 mL/hr over 30 Minutes Intravenous Once 01/11/20 1945 01/11/20 2130   01/11/20 1730  vancomycin (VANCOCIN) 2,500 mg in sodium chloride 0.9 % 500 mL IVPB     2,500 mg 250 mL/hr over 120 Minutes Intravenous  Once 01/11/20 1657 01/11/20 2005   01/11/20 1715  ceFEPIme (MAXIPIME) 1 g in sodium chloride 0.9 % 100 mL IVPB     1 g 200 mL/hr over 30 Minutes Intravenous  Once 01/11/20 1652 01/11/20 1806       Time Spent in minutes  30   Lala Lund M.D on 01/13/2020 at 11:28 AM  To page go to [email or web address] - password [email or web address]  [facility] -  Office   [phone]  See all Orders from today for further details    Objective:   Vitals:   01/13/20 0246 01/13/20 0400 01/13/20 0600 01/13/20  0813  BP:  132/75 140/81   Pulse: (!) 57 (!) 58 64   Resp: 16 (!) 24 17   Temp:   98.9 F (37.2 C) 97.7 F (36.5 C)  TempSrc:   Oral Oral  SpO2: 96% 97% 97%   Weight: (!) 140 kg     Height:        Wt Readings from Last 3 Encounters:  01/13/20 (!) 140 kg  01/11/20 128 kg  12/25/19 134.7 kg     Intake/Output Summary (Last 24 hours) at 01/13/2020 1128 Last data filed at 01/13/2020 0933 Gross per 24 hour  Intake 845 ml  Output 1420 ml  Net -575 ml     Physical Exam  Awake Alert, No new F.N deficits, Normal affect [place].AT,PERRAL Supple Neck,No JVD, No cervical lymphadenopathy appriciated.  Symmetrical Chest wall movement, Good air movement bilaterally, CTAB RRR,No Gallops, Rubs or new Murmurs, No Parasternal Heave +ve B.Sounds, Abd Soft, No tenderness, No organomegaly appriciated, No rebound - guarding or rigidity. L foot under UNNA boot for charcot's joint, small shallow noninfected right lower extremity lateral 1 cm in diameter ulcer        Data Review:    CBC Recent Labs  Lab 01/11/20 0333 01/11/20 1610 01/12/20 0849 01/13/20 0638  WBC 23.7* 34.5* 36.5* 29.8*  HGB 10.5* 9.0* 8.8* 8.9*  HCT 33.5* 28.7* 27.6* 28.3*  PLT 383 303 323 328  MCV 76.7* 75.3* 74.6* 74.5*  MCH 24.0* 23.6* 23.8* 23.4*  MCHC 31.3 31.4 31.9 31.4  RDW 18.4* 17.9* 17.4* 17.4*  LYMPHSABS 0.9 1.0  --  0.6*  MONOABS 1.2* 1.5*  --  1.0  EOSABS 0.1 0.0  --  0.0  BASOSABS 0.1 0.1  --  0.1    Chemistries  Recent Labs  Lab 01/11/20 0333 01/11/20 1610 01/11/20 1756 01/12/20 0849 01/13/20 0638  NA 128* 130*  --  129* 133*  K 6.1* 5.3*  --  4.8 4.1  CL 91* 90*  --  91* 93*  CO2 16* 22  --  19* 22  GLUCOSE 120* 135*  --  209* 210*  BUN 83* 57*  --  66* 46*  CREATININE 22.62* 16.89*  --  17.51* 12.16*  CALCIUM 9.1 8.5*  --  8.6* 9.4  MG  --    --   --  1.9 2.1  AST 15  --  18  --  13*  ALT 10  --  10  --  10  ALKPHOS 91  --  84  --  88  BILITOT 0.9  --  0.6  --  0.6   ------------------------------------------------------------------------------------------------------------------ Recent Labs    01/11/20 1756  TRIG 235*    Lab Results  Component Value Date   HGBA1C 10.5 (H) 08/08/2018   ------------------------------------------------------------------------------------------------------------------ No results for input(s): TSH, T4TOTAL, T3FREE, THYROIDAB in the last 72 hours.  Invalid input(s): FREET3  Cardiac Enzymes No results for input(s): CKMB, TROPONINI, MYOGLOBIN in the last 168 hours.  Invalid input(s): CK ------------------------------------------------------------------------------------------------------------------    Component Value Date/Time   BNP 307.3 (H) 01/13/2020 6010    Micro Results Recent Results (from the past 240 hour(s))  SARS CORONAVIRUS 2 (TAT 6-24 HRS) Nasopharyngeal Nasopharyngeal Swab     Status: None   Collection Time: 01/08/20 10:10 AM   Specimen: Nasopharyngeal Swab  Result Value Ref Range Status   SARS Coronavirus 2 NEGATIVE NEGATIVE Final    Comment: (NOTE) SARS-CoV-2 target nucleic acids are NOT DETECTED. The SARS-CoV-2 RNA is generally detectable  in upper and lower respiratory specimens during the acute phase of infection. Negative results do not preclude SARS-CoV-2 infection, do not rule out co-infections with other pathogens, and should not be used as the sole basis for treatment or other patient management decisions. Negative results must be combined with clinical observations, patient history, and epidemiological information. The expected result is Negative. Fact Sheet for Patients: SugarRoll.be Fact Sheet for Healthcare Providers: https://www.woods-mathews.com/ This test is not yet approved or cleared by the [place]  FDA and  has been authorized for detection and/or diagnosis of SARS-CoV-2 by FDA under an Emergency Use Authorization (EUA). This EUA will remain  in effect (meaning this test can be used) for the duration of the COVID-19 declaration under Section 56 4(b)(1) of the Act, 21 U.S.C. section 360bbb-3(b)(1), unless the authorization is terminated or revoked sooner. Performed at [facility], [place] [address], [place], [place] [address]   Respiratory Panel by RT PCR (Flu A&B, Covid) - Nasopharyngeal Swab     Status: Abnormal   Collection Time: 01/11/20  4:15 PM   Specimen: Nasopharyngeal Swab  Result Value Ref Range Status   SARS Coronavirus 2 by RT PCR POSITIVE (A) NEGATIVE Final    Comment: RESULT CALLED TO, READ BACK BY AND VERIFIED WITH: Kathryne Sharper RN [id number] 01/11/20 JM (NOTE) SARS-CoV-2 target nucleic acids are DETECTED. SARS-CoV-2 RNA is generally detectable in upper respiratory specimens  during the acute phase of infection. Positive results are indicative of the presence of the identified virus, but do not rule out bacterial infection or co-infection with other pathogens not detected by the test. Clinical correlation with patient history and other diagnostic information is necessary to determine patient infection status. The expected result is Negative. Fact Sheet for Patients:  PinkCheek.be Fact Sheet for Healthcare Providers: GravelBags.it This test is not yet approved or cleared by the [place] FDA and  has been authorized for detection and/or diagnosis of SARS-CoV-2 by FDA under an Emergency Use Authorization (EUA).  This EUA will remain in effect (meaning this test can be used) for th e duration of  the COVID-19 declaration under Section 564(b)(1) of the Act, 21 U.S.C. section 360bbb-3(b)(1), unless the authorization is terminated or revoked sooner.    Influenza A by PCR NEGATIVE NEGATIVE Final   Influenza B  by PCR NEGATIVE NEGATIVE Final    Comment: (NOTE) The Xpert Xpress SARS-CoV-2/FLU/RSV assay is intended as an aid in  the diagnosis of influenza from Nasopharyngeal swab specimens and  should not be used as a sole basis for treatment. Nasal washings and  aspirates are unacceptable for Xpert Xpress SARS-CoV-2/FLU/RSV  testing. Fact Sheet for Patients: PinkCheek.be Fact Sheet for Healthcare Providers: GravelBags.it This test is not yet approved or cleared by the [place] FDA and  has been authorized for detection and/or diagnosis of SARS-CoV-2 by  FDA under an Emergency Use Authorization (EUA). This EUA will remain  in effect (meaning this test can be used) for the duration of the  Covid-19 declaration under Section 564(b)(1) of the Act, 21  U.S.C. section 360bbb-3(b)(1), unless the authorization is  terminated or revoked. Performed at [facility], [place] [address] [place], [place] [address]   Culture, blood (routine x 2)     Status: Abnormal (Preliminary result)   Collection Time: 01/11/20  4:52 PM   Specimen: BLOOD  Result Value Ref Range Status   Specimen Description   Final    BLOOD RIGHT ANTECUBITAL Performed at [facility], [place] [address], [place], [place] [address]    Special Requests   Final    BOTTLES DRAWN AEROBIC AND ANAEROBIC Blood Culture adequate volume Performed at [place] [address], [place], [place] [address]    Culture  Setup Time   Final    GRAM POSITIVE COCCI IN CHAINS AEROBIC BOTTLE ONLY CRITICAL RESULT CALLED TO, READ BACK BY AND VERIFIED WITH: PHARMD L CHEN [id number] AT 1011 BY CM    Culture (A)  Final    STREPTOCOCCUS PNEUMONIAE SUSCEPTIBILITIES TO FOLLOW Performed at [facility], [place] [address] [place], [place] [address]    Report Status PENDING  Incomplete  Culture, blood (routine x 2)     Status: None (Preliminary  result)   Collection Time: 01/11/20  5:46 PM   Specimen: BLOOD LEFT HAND  Result Value Ref Range Status   Specimen Description   Final    BLOOD LEFT HAND Performed at [place] [address] [place], [place] [address]    Special Requests   Final    BOTTLES DRAWN AEROBIC ONLY Blood Culture adequate volume Performed at [place] [address] [place], [place] [address]    Culture   Final    NO GROWTH 2 DAYS Performed at [place] [address] [place],  [address]    Report Status PENDING  Incomplete  MRSA PCR Screening     Status: None   Collection Time: 01/12/20  2:28 AM   Specimen: Nasal Mucosa; Nasopharyngeal  Result Value Ref Range Status   MRSA by PCR NEGATIVE NEGATIVE Final    Comment:        The GeneXpert MRSA Assay (FDA approved for NASAL specimens only), is one component of a comprehensive MRSA colonization surveillance program. It is not intended to diagnose MRSA infection nor to guide or monitor treatment for MRSA infections. Performed at [facility], [place] [address] [place],  [address]     Radiology Reports DG Chest [place] 1 View  Result Date: 01/12/2020 CLINICAL DATA:  Short of breath EXAM: PORTABLE CHEST 1 VIEW COMPARISON:  01/11/2020 FINDINGS: Right upper lobe airspace disease unchanged. Improved aeration in the lung bases with improved lung volume. Negative for edema or effusion. Central venous catheter tip in the right atrium unchanged. IMPRESSION: Persistent right upper lobe airspace disease unchanged Improved aeration lungs with decrease in bibasilar atelectasis. Electronically Signed   By: Franchot Gallo M.D.   On: 01/12/2020 09:16   DG Chest [place] 1 View  Result Date: 01/11/2020 CLINICAL DATA:  Shortness of breath EXAM: PORTABLE CHEST 1 VIEW COMPARISON:  01/10/2019 FINDINGS: There is bilateral diffuse interstitial thickening. There is a nodular appearance along the right  suprahilar region likely reflecting prominent pulmonary vasculature or airspace disease. There is no pleural effusion or pneumothorax. The heart mediastinum are stable. There is a large bore left-sided central venous catheter with the tip projecting over the cavoatrial junction. There is no acute osseous abnormality. IMPRESSION: Diffuse bilateral interstitial thickening likely reflecting interstitial edema. Nodular appearance along the right suprahilar region likely reflecting prominent pulmonary vasculature or airspace disease. Electronically Signed   By: Kathreen Devoid   On: 01/11/2020 16:31   DG Chest Port 1 View  Result Date: 01/11/2020 CLINICAL DATA:  Cough, short of breath EXAM: PORTABLE CHEST 1 VIEW COMPARISON:  03/07/2017 FINDINGS: Single frontal view of the chest demonstrates a left internal jugular dialysis catheter tip overlying atrial caval  junction. Vascular stent overlies the right axilla. Cardiac silhouette is stable. There is a 7.3 cm mass projecting over the right anterior first and second ribs. CT chest with IV contrast recommended for suspected neoplasm. There is central vascular congestion with diffuse interstitial prominence consistent with mild fluid overload. Right hilar prominence could also reflect lymphadenopathy. No effusion or pneumothorax. No acute bony abnormality. IMPRESSION: 1. 7.3 cm right upper lobe mass consistent with neoplasm. CT chest with IV contrast is recommended. 2. Right hilar prominence could reflect underlying adenopathy. 3. Vascular congestion and interstitial edema. Electronically Signed   By: Randa Ngo M.D.   On: 01/11/2020 04:02   VAS [place] UPPER EXTREMITY ARTERIAL DUPLEX  Result Date: 12/25/2019 UPPER EXTREMITY DUPLEX STUDY Indications: Pre-operative exam.  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 Bilateral testing is considered an integral part of a complete examination. Limited examinations for reoccurring indications may be performed as noted.  Right Pre-Dialysis Findings: +-----------------------+----------+--------------------+---------+--------+  Location                PSV (cm/s) Intralum. Diam. (cm) Waveform  Comments  +-----------------------+----------+--------------------+---------+--------+  Brachial Antecub. fossa 40         0.46                 triphasic           +-----------------------+----------+--------------------+---------+--------+  Radial Art at Wrist     35         0.29                 triphasic           +-----------------------+----------+--------------------+---------+--------+  Ulnar Art at Wrist      81         0.27                 triphasic           +-----------------------+----------+--------------------+---------+--------+ +------------+-----------------+  Allen's Test Branches at fossa  +------------+-----------------+ Left Pre-Dialysis Findings: +-----------------------+----------+--------------------+---------+--------+  Location                PSV (cm/s) Intralum. Diam. (cm) Waveform  Comments  +-----------------------+----------+--------------------+---------+--------+  Brachial Antecub. fossa 54         0.52                 triphasic           +-----------------------+----------+--------------------+---------+--------+  Radial Art at Wrist     44         0.27                 triphasic tortuous  +-----------------------+----------+--------------------+---------+--------+  Ulnar Art at Wrist      73         0.19                 triphasic           +-----------------------+----------+--------------------+---------+--------+ +------------+-----------------+  Allen's Test Branches at fossa  +------------+-----------------+  Summary:    Measurements above. *See table(s) above for measurements and observations. Electronically signed by Curt Jews MD on 12/25/2019 at 9:30:15 AM.    Final    VAS [place] UPPER EXTREMITY VEIN MAPPING  Result Date: 12/25/2019 UPPER  EXTREMITY VEIN MAPPING  Indications: Pre-access. History: History of right Radiocephalic fistula years ago.  Performing Technologist: Alvia Grove RVT  Examination Guidelines: A complete evaluation includes B-mode imaging, spectral Doppler, color Doppler, and power Doppler as needed of all accessible portions of each vessel. Bilateral testing is considered an integral part of a complete examination. Limited examinations for reoccurring indications may be performed as noted. +-----------------+-------------+----------+--------+  Right Cephalic    Diameter (cm) Depth (cm) Findings  +-----------------+-------------+----------+--------+  Prox upper arm                                [place]     +-----------------+-------------+----------+--------+  Mid upper arm                                 [place]     +-----------------+-------------+----------+--------+  Dist upper arm                                [place]     +-----------------+-------------+----------+--------+  Antecubital fossa                                  +-----------------+-------------+----------+--------+ +-----------------+-------------+----------+---------+  Right Basilic     Diameter (cm) Depth (cm) Findings   +-----------------+-------------+----------+---------+  Prox upper arm        0.46                            +-----------------+-------------+----------+---------+  Mid upper arm         0.68                 branching  +-----------------+-------------+----------+---------+  Dist upper arm        0.34                            +-----------------+-------------+----------+---------+  Antecubital fossa     0.31                            +-----------------+-------------+----------+---------+  Prox forearm          0.27                             +-----------------+-------------+----------+---------+ +-----------------+-------------+----------+--------+  Left Cephalic     Diameter (cm) Depth (cm) Findings  +-----------------+-------------+----------+--------+  Shoulder              0.47                           +-----------------+-------------+----------+--------+  Prox upper arm        0.36                           +-----------------+-------------+----------+--------+  Mid upper arm         0.41                           +-----------------+-------------+----------+--------+  Dist upper arm        0.43                           +-----------------+-------------+----------+--------+  Antecubital fossa     0.54                           +-----------------+-------------+----------+--------+  Prox forearm          0.43                           +-----------------+-------------+----------+--------+  Mid forearm           0.38                           +-----------------+-------------+----------+--------+  Dist forearm          0.33                           +-----------------+-------------+----------+--------+ +-----------------+-------------+----------+--------+  Left Basilic      Diameter (cm) Depth (cm) Findings  +-----------------+-------------+----------+--------+  Prox upper arm        0.48                           +-----------------+-------------+----------+--------+  Mid upper arm         0.51                           +-----------------+-------------+----------+--------+  Dist upper arm        0.47                           +-----------------+-------------+----------+--------+  Antecubital fossa     0.26                           +-----------------+-------------+----------+--------+  Prox forearm          0.25                           +-----------------+-------------+----------+--------+ Summary: Right: Occluded right forearm fistula. Thrombus in the cephalic vein        from the antecubital fossa extending proximally into the        subclavian  vein, appearing chronic. Left: Measurements above. *See table(s) above for measurements and observations.  Diagnosing physician: Curt Jews MD Electronically signed by Curt Jews MD on 12/25/2019 at 9:27:41 AM.    Final

## 2020-01-13 NOTE — Evaluation (Signed)
Occupational Therapy Evaluation Patient Details Name: [name] MRN: [id number] DOB: 01-Apr-1956 Today's Date: 01/13/2020    History of Present Illness Pt is a 64 y.o. male admitted 01/11/20 with c/o "not feeling well." Worked revealed COVID-19 infection with PNA. CXR with RUL mass suspicious for postobstructive bacterial PNA. PMH includes HTN, OSA, DM2, ESRD (HF MWF), Hep-C, pt reports R charcot foot deformity (wears boot).   Clinical Impression   This 64 y/o male presents with the above. PTA pt reports independent with mobility and receiving assistance from spouse for bathing/dressing ADL tasks. Pt currently requiring minA for functional transfers using RW, minA for LB ADL and setup/supervision for seated UB ADL. Session limited today as transport arriving to take pt for CT, assisted pt to transport chair during session completion. Pt lives with spouse who pt reports works during the day. Pt will benefit from continued acute OT services and currently recommend follow up [facility] services after discharge to progress pt towards his PLOF. Will follow.     Follow Up Recommendations  Home health OT;Supervision - Intermittent    Equipment Recommendations  Tub/shower seat           Precautions / Restrictions Precautions Precautions: Fall Precaution Comments: R charcot foot deformity w/ boot pt wears at baseline (currently broken in room) Restrictions Weight Bearing Restrictions: No      Mobility Bed Mobility Overal bed mobility: Modified Independent             General bed mobility comments: Received sitting EOB  Transfers Overall transfer level: Needs assistance Equipment used: Rolling walker (2 wheeled) Transfers: Sit to/from Stand Sit to Stand: Supervision         General transfer comment: for safety    Balance Overall balance assessment: Needs assistance   Sitting balance-Leahy Scale: Good     Standing balance support: Bilateral upper extremity  supported Standing balance-Leahy Scale: Poor Standing balance comment: Reliant on UE support                           ADL either performed or assessed with clinical judgement   ADL Overall ADL's : Needs assistance/impaired Eating/Feeding: Modified independent;Sitting   Grooming: Set up;Supervision/safety;Sitting   Upper Body Bathing: Set up;Supervision/ safety;Sitting   Lower Body Bathing: Minimal assistance;Sit to/from stand   Upper Body Dressing : Supervision/safety;Set up;Sitting   Lower Body Dressing: Minimal assistance;Sit to/from stand   Toilet Transfer: Minimal assistance;RW;Stand-pivot   Toileting- Clothing Manipulation and Hygiene: Minimal assistance;Sit to/from stand       Functional mobility during ADLs: Minimal assistance;Rolling walker General ADL Comments: transport arriving to take pt for CT, assisted with transfer to transport chair during session                         Pertinent Vitals/Pain Pain Assessment: Faces Faces Pain Scale: Hurts a little bit Pain Location: RLE Pain Descriptors / Indicators: Discomfort Pain Intervention(s): Limited activity within patient's tolerance;Monitored during session;Repositioned     Hand Dominance     Extremity/Trunk Assessment Upper Extremity Assessment Upper Extremity Assessment: Overall WFL for tasks assessed   Lower Extremity Assessment Lower Extremity Assessment: Defer to PT evaluation       Communication Communication Communication: No difficulties   Cognition Arousal/Alertness: Awake/alert Behavior During Therapy: WFL for tasks assessed/performed;Flat affect Overall Cognitive Status: Within Functional Limits for tasks assessed  General Comments: WFL for simple tasks; not formally assessed   General Comments       Exercises     Shoulder Instructions      Home Living Family/patient expects to be discharged to:: Private  residence Living Arrangements: Spouse/significant other Available Help at Discharge: Family;Available PRN/intermittently Type of Home: House Home Access: Stairs to enter [employer] of Steps: 5-6 Entrance Stairs-Rails: Left Home Layout: One level     Bathroom Shower/Tub: Teacher, early years/pre: Standard     Home Equipment: Crutches          Prior Functioning/Environment Level of Independence: Needs assistance  Gait / Transfers Assistance Needed: Indep ambulation without DME, typically wears R foot for charcot deformity ADL's / Homemaking Assistance Needed: spouse assists with bathing/dressing, pt sponge bathes            OT Problem List: Decreased strength;Decreased range of motion;Decreased activity tolerance;Impaired balance (sitting and/or standing);Decreased knowledge of use of DME or AE;Pain;Cardiopulmonary status limiting activity      OT Treatment/Interventions: Self-care/ADL training;Therapeutic exercise;Energy conservation;DME and/or AE instruction;Therapeutic activities;Patient/family education;Balance training    OT Goals(Current goals can be found in the care plan section) Acute Rehab OT Goals Patient Stated Goal: Return home, fix R boot OT Goal Formulation: With patient Time For Goal Achievement: 01/27/20 Potential to Achieve Goals: Good  OT Frequency: Min 2X/week   Barriers to D/C:            Co-evaluation              AM-PAC OT "6 Clicks" Daily Activity     Outcome Measure Help from another person eating meals?: None Help from another person taking care of personal grooming?: A Little Help from another person toileting, which includes using toliet, bedpan, or urinal?: A Little Help from another person bathing (including washing, rinsing, drying)?: A Little Help from another person to put on and taking off regular upper body clothing?: None Help from another person to put on and taking off regular lower body clothing?: A  Little 6 Click Score: 20   End of Session Equipment Utilized During Treatment: Rolling walker Nurse Communication: Mobility status  Activity Tolerance: Patient tolerated treatment well Patient left: Other (comment)(being taken for CT by transport)  OT Visit Diagnosis: Other abnormalities of gait and mobility (R26.89);Unsteadiness on feet (R26.81)                Time: 4403-4742 OT Time Calculation (min): 23 min Charges:  OT General Charges $OT Visit: 1 Visit OT Evaluation $OT Eval Moderate Complexity: 1 Mod OT Treatments $Self Care/Home Management : 8-22 mins  Lou Cal, OT Supplemental Rehabilitation Services Pager [phone] Office [phone]   Raymondo Band 01/13/2020, 5:02 PM

## 2020-01-13 NOTE — Progress Notes (Signed)
CT reviewed.  Hopeful this is just infectious either COVID or bacterial. Would do good 7 day course of CAP coverage with augmentin or cefdinir. Will arrange f/u in clinic in 4 weeks with CXR to assure resolution.  If not may warrant bronch. Remainder of care per primary, call if we can be of further help.

## 2020-01-14 ENCOUNTER — Telehealth: Payer: Self-pay

## 2020-01-14 ENCOUNTER — Inpatient Hospital Stay (HOSPITAL_COMMUNITY): Payer: Medicare Other

## 2020-01-14 DIAGNOSIS — U071 COVID-19: Secondary | ICD-10-CM

## 2020-01-14 LAB — CBC WITH DIFFERENTIAL/PLATELET
Abs Immature Granulocytes: 1.45 10*3/uL — ABNORMAL HIGH (ref 0.00–0.07)
Basophils Absolute: 0.1 10*3/uL (ref 0.0–0.1)
Basophils Relative: 0 %
Eosinophils Absolute: 0 10*3/uL (ref 0.0–0.5)
Eosinophils Relative: 0 %
HCT: 29.2 % — ABNORMAL LOW (ref 39.0–52.0)
Hemoglobin: 9.1 g/dL — ABNORMAL LOW (ref 13.0–17.0)
Immature Granulocytes: 6 %
Lymphocytes Relative: 3 %
Lymphs Abs: 0.7 10*3/uL (ref 0.7–4.0)
MCH: 23.2 pg — ABNORMAL LOW (ref 26.0–34.0)
MCHC: 31.2 g/dL (ref 30.0–36.0)
MCV: 74.3 fL — ABNORMAL LOW (ref 80.0–100.0)
Monocytes Absolute: 0.9 10*3/uL (ref 0.1–1.0)
Monocytes Relative: 3 %
Neutro Abs: 22.7 10*3/uL — ABNORMAL HIGH (ref 1.7–7.7)
Neutrophils Relative %: 88 %
Platelets: 297 10*3/uL (ref 150–400)
RBC: 3.93 MIL/uL — ABNORMAL LOW (ref 4.22–5.81)
RDW: 17.4 % — ABNORMAL HIGH (ref 11.5–15.5)
WBC: 25.8 10*3/uL — ABNORMAL HIGH (ref 4.0–10.5)
nRBC: 0.1 % (ref 0.0–0.2)

## 2020-01-14 LAB — COMPREHENSIVE METABOLIC PANEL
ALT: 11 U/L (ref 0–44)
AST: 11 U/L — ABNORMAL LOW (ref 15–41)
Albumin: 2.3 g/dL — ABNORMAL LOW (ref 3.5–5.0)
Alkaline Phosphatase: 75 U/L (ref 38–126)
Anion gap: 17 — ABNORMAL HIGH (ref 5–15)
BUN: 61 mg/dL — ABNORMAL HIGH (ref 8–23)
CO2: 20 mmol/L — ABNORMAL LOW (ref 22–32)
Calcium: 8.8 mg/dL — ABNORMAL LOW (ref 8.9–10.3)
Chloride: 98 mmol/L (ref 98–111)
Creatinine, Ser: 13.74 mg/dL — ABNORMAL HIGH (ref 0.61–1.24)
GFR calc Af Amer: 4 mL/min — ABNORMAL LOW (ref >60)
GFR calc non Af Amer: 3 mL/min — ABNORMAL LOW (ref >60)
Glucose, Bld: 230 mg/dL — ABNORMAL HIGH (ref 70–99)
Potassium: 4.5 mmol/L (ref 3.5–5.1)
Sodium: 135 mmol/L (ref 135–145)
Total Bilirubin: 0.7 mg/dL (ref 0.3–1.2)
Total Protein: 6.8 g/dL (ref 6.5–8.1)

## 2020-01-14 LAB — GLUCOSE, CAPILLARY
Glucose-Capillary: 206 mg/dL — ABNORMAL HIGH (ref 70–99)
Glucose-Capillary: 189 mg/dL — ABNORMAL HIGH (ref 70–99)
Glucose-Capillary: 184 mg/dL — ABNORMAL HIGH (ref 70–99)
Glucose-Capillary: 236 mg/dL — ABNORMAL HIGH (ref 70–99)

## 2020-01-14 LAB — CULTURE, BLOOD (ROUTINE X 2): Special Requests: ADEQUATE

## 2020-01-14 LAB — C-REACTIVE PROTEIN: CRP: 20.2 mg/dL — ABNORMAL HIGH (ref <1.0)

## 2020-01-14 LAB — BRAIN NATRIURETIC PEPTIDE: B Natriuretic Peptide: 476.9 pg/mL — ABNORMAL HIGH (ref 0.0–100.0)

## 2020-01-14 LAB — D-DIMER, QUANTITATIVE: D-Dimer, Quant: 4.54 ug/mL-FEU — ABNORMAL HIGH (ref 0.00–0.50)

## 2020-01-14 LAB — PROCALCITONIN: Procalcitonin: 25.99 ng/mL

## 2020-01-14 LAB — MAGNESIUM: Magnesium: 2 mg/dL (ref 1.7–2.4)

## 2020-01-14 MED ORDER — HEPARIN SODIUM (PORCINE) 10000 UNIT/ML IJ SOLN
7500.0000 [IU] | Freq: Three times a day (TID) | INTRAMUSCULAR | Status: AC
  Administered 2020-01-14 – 2020-01-17 (×9): 7500 [IU] via SUBCUTANEOUS
  Filled 2020-01-14 (×11): qty 1

## 2020-01-14 MED ORDER — METRONIDAZOLE 500 MG PO TABS
500.0000 mg | ORAL_TABLET | Freq: Three times a day (TID) | ORAL | Status: DC
  Administered 2020-01-14 – 2020-01-16 (×6): 500 mg via ORAL
  Filled 2020-01-14 (×7): qty 1

## 2020-01-14 MED ORDER — SODIUM CHLORIDE 0.9 % IV SOLN
1.0000 g | INTRAVENOUS | Status: DC
  Administered 2020-01-14 – 2020-01-15 (×2): 1 g via INTRAVENOUS
  Filled 2020-01-14 (×2): qty 10

## 2020-01-14 MED ORDER — HEPARIN SODIUM (PORCINE) 5000 UNIT/ML IJ SOLN
INTRAMUSCULAR | Status: AC
  Administered 2020-01-14: 7500 [IU]
  Filled 2020-01-14: qty 2

## 2020-01-14 NOTE — Telephone Encounter (Signed)
Pt last seen by RB in 03/2016. Pt is still admitted.   Will hold message until pt is discharged to schedule follow-up.

## 2020-01-14 NOTE — Progress Notes (Signed)
Occupational Therapy Treatment Patient Details Name: [name] MRN: [id number] DOB: [birth year] Today's Date: 01/14/2020    History of present illness Pt is a 64 y.o. male admitted 01/11/20 with c/o "not feeling well." Worked revealed COVID-19 infection with PNA. CXR with RUL mass suspicious for postobstructive bacterial PNA. PMH includes HTN, OSA, DM2, ESRD (HF MWF), Hep-C, pt reports R charcot foot deformity (wears boot).   OT comments  Pt progressing towards OT goals. Pt received in recliner chair and agreeable to therapy. Pt Modified Independent for sit to stand from recliner with RW. Pt Supervision for mobility to/from bathroom using RW to ensure safety. Pt distant supervision for toilet transfer and toileting task with regular toilet. Pt's vitals remained WFL throughout on RA, denied SOB after activity. Provided written handout for UE HEP to maximize strength for ADLs. Guided pt in UE exercises using green theraband, minimal cues required and pt able to return demonstrate successfully. Pt Modified Independent to return to bed and left with IV team. DC plan remains appropriate. Will continue to follow acutely.    Follow Up Recommendations  Home health OT;Supervision - Intermittent    Equipment Recommendations  Tub/shower seat    Recommendations for Other Services      Precautions / Restrictions Precautions Precautions: Fall Precaution Comments: R charcot foot deformity w/ boot pt wears at baseline (currently broken in room) Restrictions Weight Bearing Restrictions: No       Mobility Bed Mobility Overal bed mobility: Modified Independent                Transfers Overall transfer level: Needs assistance Equipment used: Rolling walker (2 wheeled) Transfers: Sit to/from [employer] Transfers Sit to Stand: Modified independent (Device/Increase time) Stand pivot transfers: Supervision       General transfer comment: for safety    Balance                                           ADL either performed or assessed with clinical judgement   ADL Overall ADL's : Needs assistance/impaired Eating/Feeding: Independent;Sitting                       Toilet Transfer: Supervision/safety;Ambulation;Regular Toilet   Toileting- Clothing Manipulation and Hygiene: Supervision/safety;Sitting/lateral lean;Sit to/from stand       Functional mobility during ADLs: Supervision/safety;Rolling walker General ADL Comments: Pt supervision for mobility to/from bathroom using RW. No overt balance issues noted     Vision       Perception     Praxis      Cognition Arousal/Alertness: Awake/alert Behavior During Therapy: WFL for tasks assessed/performed;Flat affect Overall Cognitive Status: Within Functional Limits for tasks assessed                                          Exercises Exercises: General Upper Extremity General Exercises - Upper Extremity Shoulder Flexion: AROM;Strengthening;Both;10 reps;Seated;Theraband Theraband Level (Shoulder Flexion): Level 3 (Green) Shoulder ABduction: AROM;Strengthening;Both;10 reps;Seated;Theraband Theraband Level (Shoulder Abduction): Level 3 (Green) Elbow Flexion: AROM;Strengthening;Both;10 reps;Seated;Theraband Theraband Level (Elbow Flexion): Level 3 (Green) Elbow Extension: AROM;Strengthening;Both;10 reps;Seated;Theraband Theraband Level (Elbow Extension): Level 3 (Green)   Shoulder Instructions       General Comments      Pertinent Vitals/ Pain  Pain Assessment: No/denies pain  Home Living                                          Prior Functioning/Environment              Frequency  Min 2X/week        Progress Toward Goals  OT Goals(current goals can now be found in the care plan section)  Progress towards OT goals: Progressing toward goals  Acute Rehab OT Goals Patient Stated Goal: Return home, fix R boot OT Goal  Formulation: With patient Time For Goal Achievement: 01/27/20 Potential to Achieve Goals: Good ADL Goals Pt Will Perform Grooming: with modified independence;standing Pt Will Perform Lower Body Bathing: with modified independence;sit to/from stand Pt Will Perform Lower Body Dressing: with modified independence;sit to/from stand Pt Will Transfer to Toilet: with modified independence;ambulating Pt Will Perform Toileting - Clothing Manipulation and hygiene: with modified independence;sit to/from stand  Plan Discharge plan remains appropriate    Co-evaluation                 AM-PAC OT "6 Clicks" Daily Activity     Outcome Measure   Help from another person eating meals?: None Help from another person taking care of personal grooming?: A Little Help from another person toileting, which includes using toliet, bedpan, or urinal?: A Little Help from another person bathing (including washing, rinsing, drying)?: A Little Help from another person to put on and taking off regular upper body clothing?: None Help from another person to put on and taking off regular lower body clothing?: A Little 6 Click Score: 20    End of Session Equipment Utilized During Treatment: Gait belt;Rolling walker  OT Visit Diagnosis: Other abnormalities of gait and mobility (R26.89);Unsteadiness on feet (R26.81)   Activity Tolerance Patient tolerated treatment well   Patient Left in bed;with call bell/phone within reach(with IV team)   Nurse Communication          Time: 9509-3267 OT Time Calculation (min): 29 min  Charges: OT General Charges $OT Visit: 1 Visit OT Treatments $Self Care/Home Management : 8-22 mins $Therapeutic Exercise: 8-22 mins  Layla Maw, OTR/L   Layla Maw 01/14/2020, 2:31 PM

## 2020-01-14 NOTE — Telephone Encounter (Signed)
-----   Message from Candee Furbish, MD sent at 01/13/2020  1:19 PM EST ----- Regarding: 4 week f/u with any With chest x-rayFollow up pneumonia Will be out of infectious period for COVID  Thanks, Linna Hoff

## 2020-01-14 NOTE — Telephone Encounter (Signed)
So pt's appt will show up on AVS once discharged from the hospital, I have scheduled pt a HFU and also placed order for the cxr to be done prior to pt's OV. Nothing further needed.

## 2020-01-14 NOTE — Progress Notes (Signed)
[facility] NEPHROLOGY PROGRESS NOTE  Assessment/ Plan: Pt is a 64 y.o. yo male    Outpt HD:MWF [facility], 5h 550/800 132kg 2/2 bath P4 AVG Hep 10K+ 2K midrunCalcitriol 3.25 ug tiw,sensipar 180 qhd,venofer 50 /wk,mirc 60 q 2, last 2/22  #COVID-19 pneumonia/acute hypoxic respiratory failure: On remdesivir, antibiotics per primary team.  Plan for repeat blood culture today.  # ESRD MWF schedule: Status post HD today, UF 2000 cc.  Tolerated well.  # Anemia of CKD: Continue ESA.  Monitor hemoglobin.  Received Mircera on 2/22.  # Secondary hyperparathyroidism: On Sensipar, Auryxia.  Monitor Ca, phos level.   # HTN/volume: Continue current antihypertensive medication and the UF during HD.  Subjective: Seen and examined at bedside.  Reports feeling good.  Had dialysis today and tolerated well.  Denies nausea vomiting chest pain shortness of breath. Objective Vital signs in last 24 hours: Vitals:   01/14/20 0900 01/14/20 1000 01/14/20 1030 01/14/20 1057  BP: (!) 97/49 (!) 93/52 117/68 (!) 142/75  Pulse: 65 68 73 67  Resp:    12  Temp:    97.8 F (36.6 C)  TempSrc:    Oral  SpO2:      Weight:    129 kg  Height:       Weight change: 0.7 kg  Intake/Output Summary (Last 24 hours) at 01/14/2020 1153 Last data filed at 01/14/2020 1046 Gross per 24 hour  Intake 480 ml  Output 2007 ml  Net -1527 ml       Labs: Basic Metabolic Panel: Recent Labs  Lab 01/12/20 0849 01/13/20 0638 01/14/20 0238  NA 129* 133* 135  K 4.8 4.1 4.5  CL 91* 93* 98  CO2 19* 22 20*  GLUCOSE 209* 210* 230*  BUN 66* 46* 61*  CREATININE 17.51* 12.16* 13.74*  CALCIUM 8.6* 9.4 8.8*   Liver Function Tests: Recent Labs  Lab 01/11/20 1756 01/13/20 0638 01/14/20 0238  AST 18 13* 11*  ALT 10 10 11   ALKPHOS 84 88 75  BILITOT 0.6 0.6 0.7  PROT 8.1 7.3 6.8  ALBUMIN 3.2* 2.4* 2.3*   No results for input(s): LIPASE, AMYLASE in the last 168 hours. No results for input(s): AMMONIA  in the last 168 hours. CBC: Recent Labs  Lab 01/11/20 0333 01/11/20 0333 01/11/20 1610 01/11/20 1610 01/12/20 0849 01/13/20 0638 01/14/20 0238  WBC 23.7*   < > 34.5*   < > 36.5* 29.8* 25.8*  NEUTROABS 20.5*   < > 29.8*  --   --  27.0* 22.7*  HGB 10.5*   < > 9.0*   < > 8.8* 8.9* 9.1*  HCT 33.5*   < > 28.7*   < > 27.6* 28.3* 29.2*  MCV 76.7*  --  75.3*  --  74.6* 74.5* 74.3*  PLT 383   < > 303   < > 323 328 297   < > = values in this interval not displayed.   Cardiac Enzymes: No results for input(s): CKTOTAL, CKMB, CKMBINDEX, TROPONINI in the last 168 hours. CBG: Recent Labs  Lab 01/13/20 0748 01/13/20 1243 01/13/20 1633 01/13/20 2151 01/14/20 0625  GLUCAP 190* 261* 171* 195* 206*    Iron Studies:  Recent Labs    01/11/20 1610  FERRITIN 1,284*   Studies/Results: CT ABDOMEN PELVIS WO CONTRAST  Result Date: 01/13/2020 CLINICAL DATA:  Follow-up of COVID pneumonia. Lung nodule on chest radiograph. Diffuse abdominal pain. EXAM: CT CHEST, ABDOMEN AND PELVIS WITHOUT CONTRAST TECHNIQUE: Multidetector CT imaging of the  chest, abdomen and pelvis was performed following the standard protocol without IV contrast. COMPARISON:  Chest radiograph 01/12/2020. CT of the abdomen and pelvis of 12/05/2018. CTA of the chest of 03/04/2017. FINDINGS: CT CHEST FINDINGS Cardiovascular: A left sided central line terminates in the right atrium. Aortic atherosclerosis. Tortuous thoracic aorta. Moderate cardiomegaly, without pericardial effusion. Multivessel coronary artery atherosclerosis. Pulmonary artery enlargement, outflow tract 3.6 cm. 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 Mediastinum/Nodes: Right paratracheal node of 1.1 cm Hilar regions poorly evaluated without intravenous contrast. Lungs/Pleura: No pleural fluid.  Mild centrilobular emphysema. Right greater than left peripheral upper lobe airspace disease with mild surrounding ground-glass. Musculoskeletal: No  acute osseous abnormality. CT ABDOMEN PELVIS FINDINGS Hepatobiliary: Hepatomegaly at 19.2 cm craniocaudal. Hepatic morphology, including caudate lobe enlargement and irregular hepatic capsule, highly suspicious for mild cirrhosis. Normal gallbladder, without biliary ductal dilatation. Pancreas: Normal, without mass or ductal dilatation. Spleen: Normal in size, without focal abnormality. Adrenals/Urinary Tract: Mild bilateral adrenal thickening. Native renal atrophy on the left. Right nephrectomy. Multiple low-density left renal lesions are likely cysts. No bladder calculi. Stomach/Bowel: Normal stomach, without wall thickening. Scattered colonic diverticula. Wall thickening within the sigmoid with mild surrounding edema, including on 95/3. No pericolonic fluid collection or free perforation. Separate area of pericolonic edema posterior to the ascending colon including on 84/3. Vascular/Lymphatic: Aortic and branch vessel atherosclerosis. Reproductive: No abdominopelvic adenopathy.  Normal prostate. Other: Fat containing left inguinal hernia. No significant free fluid. Tiny periumbilical fat containing ventral abdominal wall laxity. Musculoskeletal: Renal osteodystrophy.  Lumbosacral spondylosis. IMPRESSION: CT CHEST IMPRESSION 1. Right greater than left posterior upper lobe airspace and ground-glass opacity. Most consistent with pneumonia. This is not atypical appearance of COVID-19 pneumonia, which cannot be excluded. Other infectious etiologies should also be considered. 2. Aortic atherosclerosis (ICD10-I70.0), coronary artery atherosclerosis and emphysema (ICD10-J43.9). 3. Mild thoracic adenopathy, likely reactive. 4. Pulmonary artery enlargement suggests pulmonary arterial hypertension. CT ABDOMEN AND PELVIS IMPRESSION 1. Sigmoid wall thickening and pericolonic edema, likely related to non complicated diverticulitis. Separate edema posterior to the ascending colon is nonspecific and could represent an area of  colitis or mild diverticulitis. 2. Hepatomegaly. Hepatic morphology, highly suspicious for cirrhosis. Electronically Signed   By: Abigail Miyamoto M.D.   On: 01/13/2020 12:36   CT CHEST WO CONTRAST  Result Date: 01/13/2020 CLINICAL DATA:  Follow-up of COVID pneumonia. Lung nodule on chest radiograph. Diffuse abdominal pain. EXAM: CT CHEST, ABDOMEN AND PELVIS WITHOUT CONTRAST TECHNIQUE: Multidetector CT imaging of the chest, abdomen and pelvis was performed following the standard protocol without IV contrast. COMPARISON:  Chest radiograph 01/12/2020. CT of the abdomen and pelvis of 12/05/2018. CTA of the chest of 03/04/2017. FINDINGS: CT CHEST FINDINGS Cardiovascular: A left sided central line terminates in the right atrium. Aortic atherosclerosis. Tortuous thoracic aorta. Moderate cardiomegaly, without pericardial effusion. Multivessel coronary artery atherosclerosis. Pulmonary artery enlargement, outflow tract 3.6 cm. 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 Mediastinum/Nodes: Right paratracheal node of 1.1 cm Hilar regions poorly evaluated without intravenous contrast. Lungs/Pleura: No pleural fluid.  Mild centrilobular emphysema. Right greater than left peripheral upper lobe airspace disease with mild surrounding ground-glass. Musculoskeletal: No acute osseous abnormality. CT ABDOMEN PELVIS FINDINGS Hepatobiliary: Hepatomegaly at 19.2 cm craniocaudal. Hepatic morphology, including caudate lobe enlargement and irregular hepatic capsule, highly suspicious for mild cirrhosis. Normal gallbladder, without biliary ductal dilatation. Pancreas: Normal, without mass or ductal dilatation. Spleen: Normal in size, without focal abnormality. Adrenals/Urinary Tract: Mild bilateral adrenal thickening. Native renal atrophy on  the left. Right nephrectomy. Multiple low-density left renal lesions are likely cysts. No bladder calculi. Stomach/Bowel: Normal stomach, without wall thickening. Scattered  colonic diverticula. Wall thickening within the sigmoid with mild surrounding edema, including on 95/3. No pericolonic fluid collection or free perforation. Separate area of pericolonic edema posterior to the ascending colon including on 84/3. Vascular/Lymphatic: Aortic and branch vessel atherosclerosis. Reproductive: No abdominopelvic adenopathy.  Normal prostate. Other: Fat containing left inguinal hernia. No significant free fluid. Tiny periumbilical fat containing ventral abdominal wall laxity. Musculoskeletal: Renal osteodystrophy.  Lumbosacral spondylosis. IMPRESSION: CT CHEST IMPRESSION 1. Right greater than left posterior upper lobe airspace and ground-glass opacity. Most consistent with pneumonia. This is not atypical appearance of COVID-19 pneumonia, which cannot be excluded. Other infectious etiologies should also be considered. 2. Aortic atherosclerosis (ICD10-I70.0), coronary artery atherosclerosis and emphysema (ICD10-J43.9). 3. Mild thoracic adenopathy, likely reactive. 4. Pulmonary artery enlargement suggests pulmonary arterial hypertension. CT ABDOMEN AND PELVIS IMPRESSION 1. Sigmoid wall thickening and pericolonic edema, likely related to non complicated diverticulitis. Separate edema posterior to the ascending colon is nonspecific and could represent an area of colitis or mild diverticulitis. 2. Hepatomegaly. Hepatic morphology, highly suspicious for cirrhosis. Electronically Signed   By: Abigail Miyamoto M.D.   On: 01/13/2020 12:36   DG Chest Port 1 View  Result Date: 01/14/2020 CLINICAL DATA:  Shortness of breath, COVID positive EXAM: PORTABLE CHEST 1 VIEW COMPARISON:  01/12/2020 FINDINGS: Left dialysis catheter remains in place, unchanged. Patchy airspace disease in the right upper lobe again noted. Overall, airspace disease throughout the right lung has improved. No confluent opacity on the left. No effusions. No acute bony abnormality. Heart is borderline in size. IMPRESSION: Improving  airspace disease with mild residual patchy right upper lobe opacities. Electronically Signed   By: Rolm Baptise M.D.   On: 01/14/2020 11:33    Medications: Infusions: . cefTRIAXone (ROCEPHIN)  IV    . chlorproMAZINE (THORAZINE) IV 25 mg (01/12/20 1949)  . remdesivir 100 mg in NS 100 mL 100 mg (01/13/20 1102)    Scheduled Medications: . allopurinol  100 mg Oral BID  . amLODipine  5 mg Oral Daily  . carvedilol  6.25 mg Oral BID WC  . Chlorhexidine Gluconate Cloth  6 each Topical Daily  . cinacalcet  90 mg Oral Q M,W,F  . dexamethasone (DECADRON) injection  6 mg Intravenous Q24H  . diphenhydrAMINE  25 mg Oral Q M,W,F  . ferric citrate  420 mg Oral BID WC  . furosemide  40 mg Oral Daily  . heparin injection (subcutaneous)  7,500 Units Subcutaneous Q8H  . insulin aspart  0-15 Units Subcutaneous TID WC  . metroNIDAZOLE  500 mg Oral Q8H  . pantoprazole  40 mg Oral Daily  . pravastatin  20 mg Oral Daily    have reviewed scheduled and prn medications.  Physical Exam: General:NAD, comfortable Heart:RRR, s1s2 nl Lungs: No increased work of breathing, some basal coarse breath sound. Abdomen:soft, Non-tender, non-distended Extremities: Trace LE edema Dialysis Access: Right IJ TDC.  Avaiyah Strubel Prasad Deeanna Beightol 01/14/2020,11:53 AM  LOS: 3 days  Pager: [phone]

## 2020-01-14 NOTE — Progress Notes (Signed)
PROGRESS NOTE                                                                                                                                                                                                             Patient Demographics:    [name], is a 64 y.o. male, DOB - 1956-03-09, ZOX:[id number]  Outpatient Primary MD for the patient is [name], MD    LOS - 3  Admit date - 01/11/2020    Chief Complaint  Patient presents with  . Emesis  . Shortness of Breath  . Sore Throat       Brief Narrative  [name] is a 64 y.o. male with medical history significant for  HTN, OSA, chronic Hep C, Type 2 diabetes and ESRD on HD MW/F who presents with concerns of "not feeling well."  His work-up suggested pneumonia with chest x-ray showing right upper lobe mass suspicious for malignancy.  He also had COVID-19 infection and was admitted to the hospital.   Subjective:   Patient in bed, appears comfortable, denies any headache, no fever, no chest pain or pressure, no shortness of breath , no abdominal pain. No focal weakness.   Assessment  & Plan :     1. Acute Hypoxic Resp. Failure, positive COVID-19 infection and possible mild pneumonia, also newly diagnosed right upper lobe mass suspicious for postobstructive bacterial pneumonia VS CAP with Strep pneumonia bacteremia -   I think his main issue is sepsis from right upper lobe mass with postobstructive bacterial pneumonia, he was extremely toxic and sick, procalcitonin and CRP were both elevated, he is on Decadron which I will continue, continue remdesivir, imaging suggested right-sided infiltrate versus mass.  Blood cultures positive for strep pneumonia bacteremia 1 out of 2, discussed with ID, currently on Rocephin.  Repeat blood cultures on 01/14/2020.  If negative for 48 hours will exchange HD catheter.  For possible right-sided lung mass versus infiltrate  pulmonary was consulted who have recommended full course of of antibiotic treatment followed by repeat imaging by PCP, if question persists outpatient pulmonary follow-up.  Encouraged the patient to sit up in chair in the daytime use I-S and flutter valve for pulmonary toiletry and then prone in bed when at night.   SpO2: 100 % O2 Flow Rate (L/min): 2 L/min  Recent Labs  Lab 01/08/20 1010 01/11/20 1610 01/11/20 1615 01/11/20 1756 01/12/20 0849 01/13/20 0638 01/14/20 0238  CRP  --  34.4*  --   --  42.4* 32.0* 20.2*  DDIMER  --  10.25*  --   --  8.79* 6.32* 4.54*  FERRITIN  --  1,284*  --   --   --   --   --   BNP  --  103.8*  --   --  320.2* 307.3* 476.9*  PROCALCITON  --   --   --  22.59 28.29 28.00 25.99  SARSCOV2NAA NEGATIVE  --  POSITIVE*  --   --   --   --     Hepatic Function Latest Ref Rng & Units 01/14/2020 01/13/2020 01/11/2020  Total Protein 6.5 - 8.1 g/dL 6.8 7.3 8.1  Albumin 3.5 - 5.0 g/dL 2.3(L) 2.4(L) 3.2(L)  AST 15 - 41 U/L 11(L) 13(L) 18  ALT 0 - 44 U/L _0 Alk Phosphatase 38 - 126 U/L 75 88 84  Total Bilirubin 0.3 - 1.2 mg/dL 0.7 0.6 0.6  Bilirubin, Direct 0.0 - 0.2 mg/dL - - 0.3(H)    2.  Right upper lobe mass, history of right sided nephrectomy due to renal cell cancer in the past, history of smoking .  New diagnosis.  Her on CT more suspicious of infiltrate hence complete antibiotic course and then repeat imaging in 2 to 3 weeks by PCP if question persists outpatient pulmonary follow-up.  3.  ESRD, Monday Wednesday Friday schedule.  Nephrology consulted.  4.  Anemia of chronic disease.  Hemoglobin close to 8.  Will monitor.  No need for transfusion.  5.  Hyponatremia and hypernatremia.  Management per nephrology and HD.  6.  OSA.  Supplemental oxygen at night, does not use CPAP.  7.  Dyslipidemia.  On statin.  8.  Essential hypertension.  On combination of Norvasc, Coreg and Lasix continue.  Still makes some urine.  9.  Morbid obesity.  BMI 37.   Follow with PCP.  10.  Left foot Charcot joint, right lower extremity shallow 1 cm noninfected ulcer present on admission.  Supportive care.   11.  Possible asymptomatic diverticulitis noted on CT scan.  Add Flagyl to Rocephin and monitor.  Symptom-free.   12.  DM type II.  SSI for now.  Poor outpatient glycemic control due to hyperglycemia, A1c of 10.5.  Diabetic and insulin education will be provided.  Lab Results  Component Value Date   HGBA1C 10.5 (H) 08/08/2018   CBG (last 3)  Recent Labs    01/13/20 1633 01/13/20 2151 01/14/20 0625  GLUCAP 171* 195* 206*       Condition - Extremely Guarded  Family Communication  : Wife updated 01/13/2020, 01/14/20  Code Status : Full code  Diet :    Diet Order            Diet renal with fluid restriction Fluid restriction: 1200 mL Fluid; Room service appropriate? Yes; Fluid consistency: Thin  Diet effective now               Disposition Plan  : Stay in the hospital, is septic with bacterial infection likely postobstructive pneumonia.  Has new lung mass which needs to be worked up highly suspicious for cancer.  Consults  : Pulmonary critical care called  Procedures  :    CT chest, abdomen and pelvis noncontrast   -   1. Right greater than left posterior upper lobe airspace and  ground-glass opacity. Most consistent with pneumonia. This is not atypical appearance of COVID-19 pneumonia, which cannot be excluded. Other infectious etiologies should also be considered. 2. Aortic atherosclerosis (ICD10-I70.0), coronary artery atherosclerosis and emphysema (ICD10-J43.9). 3. Mild thoracic adenopathy, likely reactive. 4. Pulmonary artery enlargement suggests pulmonary arterial hypertension.   CT ABDOMEN AND PELVIS IMPRESSION 1. Sigmoid wall thickening and pericolonic edema, likely related to non complicated diverticulitis. Separate edema posterior to the ascending colon is nonspecific and could represent an area of colitis or mild  diverticulitis. 2. Hepatomegaly. Hepatic morphology, highly suspicious for cirrhosis.   PUD Prophylaxis : PPI  DVT Prophylaxis  :   Heparin   Lab Results  Component Value Date   PLT 297 01/14/2020    Inpatient Medications  Scheduled Meds: . allopurinol  100 mg Oral BID  . amLODipine  5 mg Oral Daily  . carvedilol  6.25 mg Oral BID WC  . Chlorhexidine Gluconate Cloth  6 each Topical Daily  . Chlorhexidine Gluconate Cloth  6 each Topical Q0600  . cinacalcet  90 mg Oral Q M,W,F  . dexamethasone (DECADRON) injection  6 mg Intravenous Q24H  . diphenhydrAMINE  25 mg Oral Q M,W,F  . ferric citrate  420 mg Oral BID WC  . furosemide  40 mg Oral Daily  . heparin injection (subcutaneous)  7,500 Units Subcutaneous Q8H  . insulin aspart  0-15 Units Subcutaneous TID WC  . pantoprazole  40 mg Oral Daily  . pravastatin  20 mg Oral Daily   Continuous Infusions: . cefTRIAXone (ROCEPHIN)  IV    . chlorproMAZINE (THORAZINE) IV 25 mg (01/12/20 1949)  . remdesivir 100 mg in NS 100 mL 100 mg (01/13/20 1102)   PRN Meds:.acetaminophen, chlorproMAZINE (THORAZINE) IV, heparin, mirtazapine, ondansetron  Antibiotics  :    Anti-infectives (From admission, onward)   Start     Dose/Rate Route Frequency Ordered Stop   01/14/20 0800  cefTRIAXone (ROCEPHIN) 1 g in sodium chloride 0.9 % 100 mL IVPB    Note to Pharmacy: Pharmacy can adjust for ESRD, strep pneumonaue pneumonia   1 g 200 mL/hr over 30 Minutes Intravenous Every 24 hours 01/14/20 0751     01/13/20 1615  vancomycin (VANCOCIN) IVPB 1000 mg/200 mL premix     1,000 mg 200 mL/hr over 60 Minutes Intravenous  Once 01/13/20 1606 01/13/20 1806   01/12/20 1800  ceFEPIme (MAXIPIME) 1 g in sodium chloride 0.9 % 100 mL IVPB  Status:  Discontinued     1 g 200 mL/hr over 30 Minutes Intravenous Every 24 hours 01/12/20 1348 01/14/20 0750   01/12/20 1409  vancomycin variable dose per unstable renal function (pharmacist dosing)  Status:  Discontinued       Does not apply See admin instructions 01/12/20 1409 01/14/20 0750   01/12/20 1000  remdesivir 100 mg in sodium chloride 0.9 % 100 mL IVPB     100 mg 200 mL/hr over 30 Minutes Intravenous Daily 01/11/20 1943 01/16/20 0959   01/12/20 0100  cefTRIAXone (ROCEPHIN) 1 g in sodium chloride 0.9 % 100 mL IVPB  Status:  Discontinued     1 g 200 mL/hr over 30 Minutes Intravenous Daily at bedtime 01/11/20 1903 01/12/20 1204   01/11/20 2030  remdesivir 100 mg in sodium chloride 0.9 % 100 mL IVPB  Status:  Discontinued     100 mg 200 mL/hr over 30 Minutes Intravenous Every 1 hr x 2 01/11/20 1942 01/11/20 1944   01/11/20 2000  azithromycin (ZITHROMAX) 500 mg in  sodium chloride 0.9 % 250 mL IVPB  Status:  Discontinued     500 mg 250 mL/hr over 60 Minutes Intravenous Every 24 hours 01/11/20 1903 01/12/20 1423   01/11/20 2000  remdesivir 200 mg in sodium chloride 0.9% 250 mL IVPB     200 mg 580 mL/hr over 30 Minutes Intravenous Once 01/11/20 1945 01/11/20 2130   01/11/20 1730  vancomycin (VANCOCIN) 2,500 mg in sodium chloride 0.9 % 500 mL IVPB     2,500 mg 250 mL/hr over 120 Minutes Intravenous  Once 01/11/20 1657 01/11/20 2005   01/11/20 1715  ceFEPIme (MAXIPIME) 1 g in sodium chloride 0.9 % 100 mL IVPB     1 g 200 mL/hr over 30 Minutes Intravenous  Once 01/11/20 1652 01/11/20 1806       Time Spent in minutes  30   Lala Lund M.D on 01/14/2020 at 10:50 AM  To page go to [email or web address] - password [facility]  [facility] -  Office  [phone]  See all Orders from today for further details    Objective:   Vitals:   01/14/20 0830 01/14/20 0900 01/14/20 1000 01/14/20 1030  BP: 110/68 (!) 97/49 (!) 93/52 117/68  Pulse: 61 65 68 73  Resp:      Temp:      TempSrc:      SpO2:      Weight:      Height:        Wt Readings from Last 3 Encounters:  01/14/20 131 kg  01/11/20 128 kg  12/25/19 134.7 kg     Intake/Output Summary (Last 24 hours) at 01/14/2020 1050 Last data filed at  01/13/2020 1920 Gross per 24 hour  Intake 480 ml  Output 1 ml  Net 479 ml     Physical Exam  Awake Alert, No new F.N deficits, Normal affect [place].AT,PERRAL Supple Neck,No JVD, No cervical lymphadenopathy appriciated.  Symmetrical Chest wall movement, Good air movement bilaterally, CTAB RRR,No Gallops, Rubs or new Murmurs, No Parasternal Heave +ve B.Sounds, Abd Soft, No tenderness, No organomegaly appriciated, No rebound - guarding or rigidity. L foot under UNNA boot for charcot's joint, small shallow noninfected right lower extremity lateral 1 cm in diameter ulcer, right subclavian HD catheter site appears clean.      Data Review:    CBC Recent Labs  Lab 01/11/20 0333 01/11/20 1610 01/12/20 0849 01/13/20 0638 01/14/20 0238  WBC 23.7* 34.5* 36.5* 29.8* 25.8*  HGB 10.5* 9.0* 8.8* 8.9* 9.1*  HCT 33.5* 28.7* 27.6* 28.3* 29.2*  PLT 383 303 323 328 297  MCV 76.7* 75.3* 74.6* 74.5* 74.3*  MCH 24.0* 23.6* 23.8* 23.4* 23.2*  MCHC 31.3 31.4 31.9 31.4 31.2  RDW 18.4* 17.9* 17.4* 17.4* 17.4*  LYMPHSABS 0.9 1.0  --  0.6* 0.7  MONOABS 1.2* 1.5*  --  1.0 0.9  EOSABS 0.1 0.0  --  0.0 0.0  BASOSABS 0.1 0.1  --  0.1 0.1    Chemistries  Recent Labs  Lab 01/11/20 0333 01/11/20 1610 01/11/20 1756 01/12/20 0849 01/13/20 0638 01/14/20 0238  NA 128* 130*  --  129* 133* 135  K 6.1* 5.3*  --  4.8 4.1 4.5  CL 91* 90*  --  91* 93* 98  CO2 16* 22  --  19* 22 20*  GLUCOSE 120* 135*  --  209* 210* 230*  BUN 83* 57*  --  66* 46* 61*  CREATININE 22.62* 16.89*  --  17.51* 12.16* 13.74*  CALCIUM 9.1 8.5*  --  8.6* 9.4 8.8*  MG  --   --   --  1.9 2.1 2.0  AST 15  --  18  --  13* 11*  ALT 10  --  10  --  10 11  ALKPHOS 91  --  84  --  88 75  BILITOT 0.9  --  0.6  --  0.6 0.7   ------------------------------------------------------------------------------------------------------------------ Recent Labs    01/11/20 1756  TRIG 235*    Lab Results  Component Value Date   HGBA1C 10.5  (H) 08/08/2018   ------------------------------------------------------------------------------------------------------------------ No results for input(s): TSH, T4TOTAL, T3FREE, THYROIDAB in the last 72 hours.  Invalid input(s): FREET3  Cardiac Enzymes No results for input(s): CKMB, TROPONINI, MYOGLOBIN in the last 168 hours.  Invalid input(s): CK ------------------------------------------------------------------------------------------------------------------    Component Value Date/Time   BNP 476.9 (H) 01/14/2020 6237    Micro Results Recent Results (from the past 240 hour(s))  SARS CORONAVIRUS 2 (TAT 6-24 HRS) Nasopharyngeal Nasopharyngeal Swab     Status: None   Collection Time: 01/08/20 10:10 AM   Specimen: Nasopharyngeal Swab  Result Value Ref Range Status   SARS Coronavirus 2 NEGATIVE NEGATIVE Final    Comment: (NOTE) SARS-CoV-2 target nucleic acids are NOT DETECTED. The SARS-CoV-2 RNA is generally detectable in upper and lower respiratory specimens during the acute phase of infection. Negative results do not preclude SARS-CoV-2 infection, do not rule out co-infections with other pathogens, and should not be used as the sole basis for treatment or other patient management decisions. Negative results must be combined with clinical observations, patient history, and epidemiological information. The expected result is Negative. Fact Sheet for Patients: SugarRoll.be Fact Sheet for Healthcare Providers: https://www.woods-mathews.com/ This test is not yet approved or cleared by the [place] FDA and  has been authorized for detection and/or diagnosis of SARS-CoV-2 by FDA under an Emergency Use Authorization (EUA). This EUA will remain  in effect (meaning this test can be used) for the duration of the COVID-19 declaration under Section 56 4(b)(1) of the Act, 21 U.S.C. section 360bbb-3(b)(1), unless the authorization is terminated  or revoked sooner. Performed at [facility], [place] [address], [place], [place] [address]   Respiratory Panel by RT PCR (Flu A&B, Covid) - Nasopharyngeal Swab     Status: Abnormal   Collection Time: 01/11/20  4:15 PM   Specimen: Nasopharyngeal Swab  Result Value Ref Range Status   SARS Coronavirus 2 by RT PCR POSITIVE (A) NEGATIVE Final    Comment: RESULT CALLED TO, READ BACK BY AND VERIFIED WITH: Kathryne Sharper RN [id number] 01/11/20 JM (NOTE) SARS-CoV-2 target nucleic acids are DETECTED. SARS-CoV-2 RNA is generally detectable in upper respiratory specimens  during the acute phase of infection. Positive results are indicative of the presence of the identified virus, but do not rule out bacterial infection or co-infection with other pathogens not detected by the test. Clinical correlation with patient history and other diagnostic information is necessary to determine patient infection status. The expected result is Negative. Fact Sheet for Patients:  PinkCheek.be Fact Sheet for Healthcare Providers: GravelBags.it This test is not yet approved or cleared by the [place] FDA and  has been authorized for detection and/or diagnosis of SARS-CoV-2 by FDA under an Emergency Use Authorization (EUA).  This EUA will remain in effect (meaning this test can be used) for th e duration of  the COVID-19 declaration under Section 564(b)(1) of the Act, 21 U.S.C. section 360bbb-3(b)(1), unless the authorization is terminated or revoked  sooner.    Influenza A by PCR NEGATIVE NEGATIVE Final   Influenza B by PCR NEGATIVE NEGATIVE Final    Comment: (NOTE) The Xpert Xpress SARS-CoV-2/FLU/RSV assay is intended as an aid in  the diagnosis of influenza from Nasopharyngeal swab specimens and  should not be used as a sole basis for treatment. Nasal washings and  aspirates are unacceptable for Xpert Xpress SARS-CoV-2/FLU/RSV  testing. Fact Sheet  for Patients: PinkCheek.be Fact Sheet for Healthcare Providers: GravelBags.it This test is not yet approved or cleared by the [place] FDA and  has been authorized for detection and/or diagnosis of SARS-CoV-2 by  FDA under an Emergency Use Authorization (EUA). This EUA will remain  in effect (meaning this test can be used) for the duration of the  Covid-19 declaration under Section 564(b)(1) of the Act, 21  U.S.C. section 360bbb-3(b)(1), unless the authorization is  terminated or revoked. Performed at [facility], [place] [address] [place], [place] [address]   Culture, blood (routine x 2)     Status: Abnormal   Collection Time: 01/11/20  4:52 PM   Specimen: BLOOD  Result Value Ref Range Status   Specimen Description   Final    BLOOD RIGHT ANTECUBITAL Performed at [facility] [address] [place], [place] [address]    Special Requests   Final    BOTTLES DRAWN AEROBIC AND ANAEROBIC Blood Culture adequate volume Performed at [facility] [address] [place], [place] [address]    Culture  Setup Time   Final    GRAM POSITIVE COCCI IN CHAINS AEROBIC BOTTLE ONLY CRITICAL RESULT CALLED TO, READ BACK BY AND VERIFIED WITH: PHARMD L CHEN [id number] AT 1011 BY CM Performed at [facility], [place] [address], [place], [place] [address]    Culture STREPTOCOCCUS PNEUMONIAE (A)  Final   Report Status 01/14/2020 FINAL  Final   Organism ID, Bacteria STREPTOCOCCUS PNEUMONIAE  Final      Susceptibility   Streptococcus pneumoniae - MIC*    ERYTHROMYCIN <=0.12 SENSITIVE Sensitive     LEVOFLOXACIN 0.5 SENSITIVE Sensitive     VANCOMYCIN 0.5 SENSITIVE Sensitive     PENICILLIN (meningitis) 0.12 RESISTANT Resistant     PENO - penicillin 0.12      PENICILLIN (non-meningitis) 0.12 SENSITIVE Sensitive     PENICILLIN (oral) 0.12 INTERMEDIATE Intermediate     CEFTRIAXONE  (non-meningitis) <=0.12 SENSITIVE Sensitive     CEFTRIAXONE (meningitis) <=0.12 SENSITIVE Sensitive     * STREPTOCOCCUS PNEUMONIAE  Culture, blood (routine x 2)     Status: None (Preliminary result)   Collection Time: 01/11/20  5:46 PM   Specimen: BLOOD LEFT HAND  Result Value Ref Range Status   Specimen Description   Final    BLOOD LEFT HAND Performed at [facility] [address] [place], [place] [address]    Special Requests   Final    BOTTLES DRAWN AEROBIC ONLY Blood Culture adequate volume Performed at [facility] [address], [place], [place] [address]    Culture   Final    NO GROWTH 3 DAYS Performed at [facility], [place] [address], [place], [place] [address]    Report Status PENDING  Incomplete  MRSA PCR Screening     Status: None   Collection Time: 01/12/20  2:28 AM   Specimen: Nasal Mucosa; Nasopharyngeal  Result Value Ref Range Status   MRSA by PCR NEGATIVE NEGATIVE Final  Comment:        The GeneXpert MRSA Assay (FDA approved for NASAL specimens only), is one component of a comprehensive MRSA colonization surveillance program. It is not intended to diagnose MRSA infection nor to guide or monitor treatment for MRSA infections. Performed at [facility], [place] [address] [place], [place] [address]     Radiology Reports CT ABDOMEN PELVIS WO CONTRAST  Result Date: 01/13/2020 CLINICAL DATA:  Follow-up of COVID pneumonia. Lung nodule on chest radiograph. Diffuse abdominal pain. EXAM: CT CHEST, ABDOMEN AND PELVIS WITHOUT CONTRAST TECHNIQUE: Multidetector CT imaging of the chest, abdomen and pelvis was performed following the standard protocol without IV contrast. COMPARISON:  Chest radiograph 01/12/2020. CT of the abdomen and pelvis of 12/05/2018. CTA of the chest of 03/04/2017. FINDINGS: CT CHEST FINDINGS Cardiovascular: A left sided central line terminates in the right atrium. Aortic atherosclerosis. Tortuous  thoracic aorta. Moderate cardiomegaly, without pericardial effusion. Multivessel coronary artery atherosclerosis. Pulmonary artery enlargement, outflow tract 3.6 cm. 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 Mediastinum/Nodes: Right paratracheal node of 1.1 cm Hilar regions poorly evaluated without intravenous contrast. Lungs/Pleura: No pleural fluid.  Mild centrilobular emphysema. Right greater than left peripheral upper lobe airspace disease with mild surrounding ground-glass. Musculoskeletal: No acute osseous abnormality. CT ABDOMEN PELVIS FINDINGS Hepatobiliary: Hepatomegaly at 19.2 cm craniocaudal. Hepatic morphology, including caudate lobe enlargement and irregular hepatic capsule, highly suspicious for mild cirrhosis. Normal gallbladder, without biliary ductal dilatation. Pancreas: Normal, without mass or ductal dilatation. Spleen: Normal in size, without focal abnormality. Adrenals/Urinary Tract: Mild bilateral adrenal thickening. Native renal atrophy on the left. Right nephrectomy. Multiple low-density left renal lesions are likely cysts. No bladder calculi. Stomach/Bowel: Normal stomach, without wall thickening. Scattered colonic diverticula. Wall thickening within the sigmoid with mild surrounding edema, including on 95/3. No pericolonic fluid collection or free perforation. Separate area of pericolonic edema posterior to the ascending colon including on 84/3. Vascular/Lymphatic: Aortic and branch vessel atherosclerosis. Reproductive: No abdominopelvic adenopathy.  Normal prostate. Other: Fat containing left inguinal hernia. No significant free fluid. Tiny periumbilical fat containing ventral abdominal wall laxity. Musculoskeletal: Renal osteodystrophy.  Lumbosacral spondylosis. IMPRESSION: CT CHEST IMPRESSION 1. Right greater than left posterior upper lobe airspace and ground-glass opacity. Most consistent with pneumonia. This is not atypical appearance of COVID-19  pneumonia, which cannot be excluded. Other infectious etiologies should also be considered. 2. Aortic atherosclerosis (ICD10-I70.0), coronary artery atherosclerosis and emphysema (ICD10-J43.9). 3. Mild thoracic adenopathy, likely reactive. 4. Pulmonary artery enlargement suggests pulmonary arterial hypertension. CT ABDOMEN AND PELVIS IMPRESSION 1. Sigmoid wall thickening and pericolonic edema, likely related to non complicated diverticulitis. Separate edema posterior to the ascending colon is nonspecific and could represent an area of colitis or mild diverticulitis. 2. Hepatomegaly. Hepatic morphology, highly suspicious for cirrhosis. Electronically Signed   By: Abigail Miyamoto M.D.   On: 01/13/2020 12:36   CT CHEST WO CONTRAST  Result Date: 01/13/2020 CLINICAL DATA:  Follow-up of COVID pneumonia. Lung nodule on chest radiograph. Diffuse abdominal pain. EXAM: CT CHEST, ABDOMEN AND PELVIS WITHOUT CONTRAST TECHNIQUE: Multidetector CT imaging of the chest, abdomen and pelvis was performed following the standard protocol without IV contrast. COMPARISON:  Chest radiograph 01/12/2020. CT of the abdomen and pelvis of 12/05/2018. CTA of the chest of 03/04/2017. FINDINGS: CT CHEST FINDINGS Cardiovascular: A left sided central line terminates in the right atrium. Aortic atherosclerosis. Tortuous thoracic aorta. Moderate cardiomegaly, without pericardial effusion. Multivessel coronary artery atherosclerosis. Pulmonary artery enlargement, outflow tract 3.6 cm. Aberrant right  subclavian artery, traversing posterior to the esophagus. Mediastinum/Nodes: Right paratracheal node of 1.1 cm Hilar regions poorly evaluated without intravenous contrast. Lungs/Pleura: No pleural fluid.  Mild centrilobular emphysema. Right greater than left peripheral upper lobe airspace disease with mild surrounding ground-glass. Musculoskeletal: No acute osseous abnormality. CT ABDOMEN PELVIS FINDINGS Hepatobiliary: Hepatomegaly at 19.2 cm craniocaudal.  Hepatic morphology, including caudate lobe enlargement and irregular hepatic capsule, highly suspicious for mild cirrhosis. Normal gallbladder, without biliary ductal dilatation. Pancreas: Normal, without mass or ductal dilatation. Spleen: Normal in size, without focal abnormality. Adrenals/Urinary Tract: Mild bilateral adrenal thickening. Native renal atrophy on the left. Right nephrectomy. Multiple low-density left renal lesions are likely cysts. No bladder calculi. Stomach/Bowel: Normal stomach, without wall thickening. Scattered colonic diverticula. Wall thickening within the sigmoid with mild surrounding edema, including on 95/3. No pericolonic fluid collection or free perforation. Separate area of pericolonic edema posterior to the ascending colon including on 84/3. Vascular/Lymphatic: Aortic and branch vessel atherosclerosis. Reproductive: No abdominopelvic adenopathy.  Normal prostate. Other: Fat containing left inguinal hernia. No significant free fluid. Tiny periumbilical fat containing ventral abdominal wall laxity. Musculoskeletal: Renal osteodystrophy.  Lumbosacral spondylosis. IMPRESSION: CT CHEST IMPRESSION 1. Right greater than left posterior upper lobe airspace and ground-glass opacity. Most consistent with pneumonia. This is not atypical appearance of COVID-19 pneumonia, which cannot be excluded. Other infectious etiologies should also be considered. 2. Aortic atherosclerosis (ICD10-I70.0), coronary artery atherosclerosis and emphysema (ICD10-J43.9). 3. Mild thoracic adenopathy, likely reactive. 4. Pulmonary artery enlargement suggests pulmonary arterial hypertension. CT ABDOMEN AND PELVIS IMPRESSION 1. Sigmoid wall thickening and pericolonic edema, likely related to non complicated diverticulitis. Separate edema posterior to the ascending colon is nonspecific and could represent an area of colitis or mild diverticulitis. 2. Hepatomegaly. Hepatic morphology, highly suspicious for cirrhosis.  Electronically Signed   By: Abigail Miyamoto M.D.   On: 01/13/2020 12:36   DG Chest Port 1 View  Result Date: 01/12/2020 CLINICAL DATA:  Short of breath EXAM: PORTABLE CHEST 1 VIEW COMPARISON:  01/11/2020 FINDINGS: Right upper lobe airspace disease unchanged. Improved aeration in the lung bases with improved lung volume. Negative for edema or effusion. Central venous catheter tip in the right atrium unchanged. IMPRESSION: Persistent right upper lobe airspace disease unchanged Improved aeration lungs with decrease in bibasilar atelectasis. Electronically Signed   By: Franchot Gallo M.D.   On: 01/12/2020 09:16   DG Chest Port 1 View  Result Date: 01/11/2020 CLINICAL DATA:  Shortness of breath EXAM: PORTABLE CHEST 1 VIEW COMPARISON:  01/10/2019 FINDINGS: There is bilateral diffuse interstitial thickening. There is a nodular appearance along the right suprahilar region likely reflecting prominent pulmonary vasculature or airspace disease. There is no pleural effusion or pneumothorax. The heart mediastinum are stable. There is a large bore left-sided central venous catheter with the tip projecting over the cavoatrial junction. There is no acute osseous abnormality. IMPRESSION: Diffuse bilateral interstitial thickening likely reflecting interstitial edema. Nodular appearance along the right suprahilar region likely reflecting prominent pulmonary vasculature or airspace disease. Electronically Signed   By: Kathreen Devoid   On: 01/11/2020 16:31   DG Chest Port 1 View  Result Date: 01/11/2020 CLINICAL DATA:  Cough, short of breath EXAM: PORTABLE CHEST 1 VIEW COMPARISON:  03/07/2017 FINDINGS: Single frontal view of the chest demonstrates a left internal jugular dialysis catheter tip overlying atrial caval junction. Vascular stent overlies the right axilla. Cardiac silhouette is stable. There is a 7.3 cm mass projecting over the right anterior first and second ribs. CT chest with  IV contrast recommended for suspected  neoplasm. There is central vascular congestion with diffuse interstitial prominence consistent with mild fluid overload. Right hilar prominence could also reflect lymphadenopathy. No effusion or pneumothorax. No acute bony abnormality. IMPRESSION: 1. 7.3 cm right upper lobe mass consistent with neoplasm. CT chest with IV contrast is recommended. 2. Right hilar prominence could reflect underlying adenopathy. 3. Vascular congestion and interstitial edema. Electronically Signed   By: Randa Ngo M.D.   On: 01/11/2020 04:02   VAS [place] UPPER EXTREMITY ARTERIAL DUPLEX  Result Date: 12/25/2019 UPPER EXTREMITY DUPLEX STUDY Indications: Pre-operative exam.  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 Bilateral testing is considered an integral part of a complete examination. Limited examinations for reoccurring indications may be performed as noted.  Right Pre-Dialysis Findings: +-----------------------+----------+--------------------+---------+--------+ Location               PSV (cm/s)Intralum. Diam. (cm)Waveform Comments +-----------------------+----------+--------------------+---------+--------+ Brachial Antecub. fossa40        0.46                triphasic         +-----------------------+----------+--------------------+---------+--------+ Radial Art at Wrist    35        0.29                triphasic         +-----------------------+----------+--------------------+---------+--------+ Ulnar Art at Wrist     81        0.27                triphasic         +-----------------------+----------+--------------------+---------+--------+ +------------+-----------------+ Allen's TestBranches at fossa  +------------+-----------------+ Left Pre-Dialysis Findings: +-----------------------+----------+--------------------+---------+--------+ Location               PSV (cm/s)Intralum. Diam. (cm)Waveform Comments +-----------------------+----------+--------------------+---------+--------+ Brachial Antecub. fossa54        0.52                triphasic         +-----------------------+----------+--------------------+---------+--------+ Radial Art at Wrist    44        0.27                triphasictortuous +-----------------------+----------+--------------------+---------+--------+ Ulnar Art at Wrist     73        0.19                triphasic         +-----------------------+----------+--------------------+---------+--------+ +------------+-----------------+ Allen's TestBranches at fossa +------------+-----------------+  Summary:   Measurements above. *See table(s) above for measurements and observations. Electronically signed by Curt Jews MD on 12/25/2019 at 9:30:15 AM.    Final    VAS [place] UPPER EXTREMITY VEIN MAPPING  Result Date: 12/25/2019 UPPER EXTREMITY VEIN MAPPING  Indications: Pre-access. History: History of right Radiocephalic fistula years ago.  Performing Technologist: Alvia Grove RVT  Examination Guidelines: A complete evaluation includes B-mode imaging, spectral Doppler, color Doppler, and power Doppler as needed of all accessible portions of each vessel. Bilateral testing is considered an integral part of a complete examination. Limited examinations for reoccurring indications may be performed as noted. +-----------------+-------------+----------+--------+ Right Cephalic   Diameter (cm)Depth (cm)Findings +-----------------+-------------+----------+--------+ Prox upper arm  [place]    +-----------------+-------------+----------+--------+ Mid upper arm                              [place]    +-----------------+-------------+----------+--------+ Dist  upper arm                             [place]    +-----------------+-------------+----------+--------+ Antecubital fossa                          [place]    +-----------------+-------------+----------+--------+ +-----------------+-------------+----------+---------+ Right Basilic    Diameter (cm)Depth (cm)Findings  +-----------------+-------------+----------+---------+ Prox upper arm       0.46                         +-----------------+-------------+----------+---------+ Mid upper arm        0.68               branching +-----------------+-------------+----------+---------+ Dist upper arm       0.34                         +-----------------+-------------+----------+---------+ Antecubital fossa    0.31                         +-----------------+-------------+----------+---------+ Prox forearm         0.27                         +-----------------+-------------+----------+---------+ +-----------------+-------------+----------+--------+ Left Cephalic    Diameter (cm)Depth (cm)Findings +-----------------+-------------+----------+--------+ Shoulder             0.47                        +-----------------+-------------+----------+--------+ Prox upper arm       0.36                        +-----------------+-------------+----------+--------+ Mid upper arm        0.41                        +-----------------+-------------+----------+--------+ Dist upper arm       0.43                        +-----------------+-------------+----------+--------+ Antecubital fossa    0.54                        +-----------------+-------------+----------+--------+ Prox forearm         0.43                        +-----------------+-------------+----------+--------+ Mid forearm          0.38                        +-----------------+-------------+----------+--------+ Dist forearm         0.33                         +-----------------+-------------+----------+--------+ +-----------------+-------------+----------+--------+ Left Basilic     Diameter (cm)Depth (cm)Findings +-----------------+-------------+----------+--------+ Prox upper arm       0.48                        +-----------------+-------------+----------+--------+  Mid upper arm        0.51                        +-----------------+-------------+----------+--------+ Dist upper arm       0.47                        +-----------------+-------------+----------+--------+ Antecubital fossa    0.26                        +-----------------+-------------+----------+--------+ Prox forearm         0.25                        +-----------------+-------------+----------+--------+ Summary: Right: Occluded right forearm fistula. Thrombus in the cephalic vein        from the antecubital fossa extending proximally into the        subclavian vein, appearing chronic. Left: Measurements above. *See table(s) above for measurements and observations.  Diagnosing physician: Curt Jews MD Electronically signed by Curt Jews MD on 12/25/2019 at 9:27:41 AM.    Final

## 2020-01-15 LAB — COMPREHENSIVE METABOLIC PANEL
ALT: 11 U/L (ref 0–44)
AST: 14 U/L — ABNORMAL LOW (ref 15–41)
Albumin: 2.7 g/dL — ABNORMAL LOW (ref 3.5–5.0)
Alkaline Phosphatase: 102 U/L (ref 38–126)
Anion gap: 18 — ABNORMAL HIGH (ref 5–15)
BUN: 60 mg/dL — ABNORMAL HIGH (ref 8–23)
CO2: 20 mmol/L — ABNORMAL LOW (ref 22–32)
Calcium: 8.4 mg/dL — ABNORMAL LOW (ref 8.9–10.3)
Chloride: 98 mmol/L (ref 98–111)
Creatinine, Ser: 11.27 mg/dL — ABNORMAL HIGH (ref 0.61–1.24)
GFR calc Af Amer: 5 mL/min — ABNORMAL LOW (ref >60)
GFR calc non Af Amer: 4 mL/min — ABNORMAL LOW (ref >60)
Glucose, Bld: 237 mg/dL — ABNORMAL HIGH (ref 70–99)
Potassium: 4.1 mmol/L (ref 3.5–5.1)
Sodium: 136 mmol/L (ref 135–145)
Total Bilirubin: 0.5 mg/dL (ref 0.3–1.2)
Total Protein: 7.4 g/dL (ref 6.5–8.1)

## 2020-01-15 LAB — CBC WITH DIFFERENTIAL/PLATELET
Abs Immature Granulocytes: 2.41 10*3/uL — ABNORMAL HIGH (ref 0.00–0.07)
Basophils Absolute: 0.1 10*3/uL (ref 0.0–0.1)
Basophils Relative: 1 %
Eosinophils Absolute: 0 10*3/uL (ref 0.0–0.5)
Eosinophils Relative: 0 %
HCT: 33.7 % — ABNORMAL LOW (ref 39.0–52.0)
Hemoglobin: 10.4 g/dL — ABNORMAL LOW (ref 13.0–17.0)
Immature Granulocytes: 11 %
Lymphocytes Relative: 6 %
Lymphs Abs: 1.3 10*3/uL (ref 0.7–4.0)
MCH: 23.5 pg — ABNORMAL LOW (ref 26.0–34.0)
MCHC: 30.9 g/dL (ref 30.0–36.0)
MCV: 76.1 fL — ABNORMAL LOW (ref 80.0–100.0)
Monocytes Absolute: 1.3 10*3/uL — ABNORMAL HIGH (ref 0.1–1.0)
Monocytes Relative: 6 %
Neutro Abs: 17 10*3/uL — ABNORMAL HIGH (ref 1.7–7.7)
Neutrophils Relative %: 76 %
Platelets: 286 10*3/uL (ref 150–400)
RBC: 4.43 MIL/uL (ref 4.22–5.81)
RDW: 18.1 % — ABNORMAL HIGH (ref 11.5–15.5)
WBC: 22.1 10*3/uL — ABNORMAL HIGH (ref 4.0–10.5)
nRBC: 0.1 % (ref 0.0–0.2)

## 2020-01-15 LAB — GLUCOSE, CAPILLARY
Glucose-Capillary: 241 mg/dL — ABNORMAL HIGH (ref 70–99)
Glucose-Capillary: 213 mg/dL — ABNORMAL HIGH (ref 70–99)
Glucose-Capillary: 233 mg/dL — ABNORMAL HIGH (ref 70–99)

## 2020-01-15 LAB — PROCALCITONIN: Procalcitonin: 13.67 ng/mL

## 2020-01-15 LAB — MAGNESIUM: Magnesium: 2 mg/dL (ref 1.7–2.4)

## 2020-01-15 LAB — D-DIMER, QUANTITATIVE: D-Dimer, Quant: 2.75 ug/mL-FEU — ABNORMAL HIGH (ref 0.00–0.50)

## 2020-01-15 LAB — BRAIN NATRIURETIC PEPTIDE: B Natriuretic Peptide: 247.4 pg/mL — ABNORMAL HIGH (ref 0.0–100.0)

## 2020-01-15 LAB — C-REACTIVE PROTEIN: CRP: 12.4 mg/dL — ABNORMAL HIGH (ref <1.0)

## 2020-01-15 MED ORDER — SODIUM CHLORIDE 0.9 % IV SOLN
2.0000 g | INTRAVENOUS | Status: DC
  Filled 2020-01-15: qty 20

## 2020-01-15 MED ORDER — HEPARIN SODIUM (PORCINE) 5000 UNIT/ML IJ SOLN
INTRAMUSCULAR | Status: AC
  Filled 2020-01-15: qty 2

## 2020-01-15 MED ORDER — CHLORHEXIDINE GLUCONATE CLOTH 2 % EX PADS
6.0000 | MEDICATED_PAD | Freq: Every day | CUTANEOUS | Status: DC
  Administered 2020-01-17: 6 via TOPICAL

## 2020-01-15 MED ORDER — SODIUM CHLORIDE 0.9 % IV SOLN
1.0000 g | Freq: Once | INTRAVENOUS | Status: AC
  Administered 2020-01-15: 1 g via INTRAVENOUS
  Filled 2020-01-15: qty 10

## 2020-01-15 NOTE — Progress Notes (Signed)
Physical Therapy Treatment Patient Details Name: [name] MRN: [id number] DOB: 1956/11/12 Today's Date: 01/15/2020    History of Present Illness Pt is a 64 y.o. male admitted 01/11/20 with c/o "not feeling well." Worked revealed COVID-19 infection with PNA. CXR with RUL mass suspicious for postobstructive bacterial PNA. PMH includes HTN, OSA, DM2, ESRD (HF MWF), Hep-C, pt reports R charcot foot deformity (wears boot).    PT Comments    Pt is on room air. He demonstrates no SOB throughout treatment. Vitals WNL. Pt's boot's (R charcot foot) top strap clip broke off. Pt aware and plans to get it fixed once he is home. Boot is able to be safely used by wrapping strap around boot and velcroing it for the time being. Pt ambulates 250' with RW with supervision and cuing for safe RW use. Initially, pt tries to lift up RW when turning. Pt is educated and demonstrated proper technique. Pt uses the teach back method with proper form from there after. Pt able to perform stair training with min guard for safety. He ascends leading with his R foot and descends leading with his L foot. D/C plans remain appropriate. PT will continue to follow acutely.     Follow Up Recommendations  Home health PT;Supervision - Intermittent     Equipment Recommendations  Rolling walker with 5" wheels       Precautions / Restrictions Precautions Precautions: Fall;Other (comment)(airborne) Precaution Comments: R charcot foot deformity w/ boot pt wears at baseline (currently top strap is broken but able to be used by wrapping it around and securing) Restrictions Weight Bearing Restrictions: No    Mobility  Bed Mobility               General bed mobility comments: Received sitting in recliner  Transfers Overall transfer level: Needs assistance Equipment used: Rolling walker (2 wheeled) Transfers: Sit to/from Stand Sit to Stand: Modified independent (Device/Increase time)(verbal cues)         General  transfer comment: for safety; pt tries to transfer and move quickly  Ambulation/Gait Ambulation/Gait assistance: Supervision Gait Distance (Feet): 250 Feet Assistive device: Rolling walker (2 wheeled) Gait Pattern/deviations: Step-through pattern;Decreased weight shift to right;Antalgic Gait velocity: Decreased Gait velocity interpretation: 1.31 - 2.62 ft/sec, indicative of limited community ambulator General Gait Details: Improved velocity, tolerance to activity, and upright trunk, pt requires verbal cues to avoid lifting RW into the air when turning and ambulating. Cues to maintain COG within the RW.   Stairs Stairs: Yes Stairs assistance: Min guard Stair Management: With walker Number of Stairs: 6(using individual step) General stair comments: Pt ascends with his R foot, descends leading with his L. Good ability and recall with safe stair management. Cuing for slow, controlled movements          Balance Overall balance assessment: Needs assistance Sitting-balance support: No upper extremity supported Sitting balance-Leahy Scale: Good Sitting balance - Comments: Sits EOB without UE support Postural control: Other (comment)(mild forward lean) Standing balance support: Bilateral upper extremity supported;During functional activity Standing balance-Leahy Scale: Fair Standing balance comment: Able to stand statically and turn from side to side while donning gown without LOB                            Cognition Arousal/Alertness: Awake/alert Behavior During Therapy: WFL for tasks assessed/performed Overall Cognitive Status: Within Functional Limits for tasks assessed  General Comments: Pt is pleasant and demonstrates good command following (1 to 2 step)      Exercises General Exercises - Lower Extremity Hip Flexion/Marching: AROM;20 reps;Standing(in RW with min guard for safety)    General Comments General comments  (skin integrity, edema, etc.): Pt is on room air. Vitals WNL. RN is addressing monitor/ electrodes on entry. L foot is re-wrapped. Pt's boot's top strap clip broke off. Able to be safely used for ambulation by wrapping strap around boot and using velcro to secure.       Pertinent Vitals/Pain Pain Assessment: No/denies pain           PT Goals (current goals can now be found in the care plan section) Acute Rehab PT Goals Patient Stated Goal: Return home, fix R boot PT Goal Formulation: With patient Time For Goal Achievement: 01/27/20 Potential to Achieve Goals: Good Progress towards PT goals: Progressing toward goals    Frequency    Min 3X/week      PT Plan Current plan remains appropriate       AM-PAC PT "6 Clicks" Mobility   Outcome Measure  Help needed turning from your back to your side while in a flat bed without using bedrails?: None Help needed moving from lying on your back to sitting on the side of a flat bed without using bedrails?: None Help needed moving to and from a bed to a chair (including a wheelchair)?: None Help needed standing up from a chair using your arms (e.g., wheelchair or bedside chair)?: None Help needed to walk in hospital room?: A Little Help needed climbing 3-5 steps with a railing? : A Little 6 Click Score: 22    End of Session Equipment Utilized During Treatment: Gait belt Activity Tolerance: Patient tolerated treatment well Patient left: in chair;with call bell/phone within reach Nurse Communication: Mobility status PT Visit Diagnosis: Unsteadiness on feet (R26.81);Other abnormalities of gait and mobility (R26.89)     Time: 1610-9604 PT Time Calculation (min) (ACUTE ONLY): 40 min  Charges:  $Therapeutic Exercise: 8-22 mins $Therapeutic Activity: 23-37 mins                     Jodelle Green, PT, DPT Acute Rehabilitation Services Office [phone]   Jodelle Green 01/15/2020, 11:40 AM

## 2020-01-15 NOTE — Progress Notes (Signed)
Will adjust ceftriaxone dose to 2g IV q24 due to bacteremia and high wt.  Onnie Boer, PharmD, BCIDP, AAHIVP, CPP Infectious Disease Pharmacist 01/15/2020 2:53 PM

## 2020-01-15 NOTE — Progress Notes (Signed)
PROGRESS NOTE                                                                                                                                                                                                             Patient Demographics:    [name], is a 64 y.o. male, DOB - 1956-06-25, YKD:[id number]  Outpatient Primary MD for the patient is [name], MD    LOS - 4  Admit date - 01/11/2020    Chief Complaint  Patient presents with  . Emesis  . Shortness of Breath  . Sore Throat       Brief Narrative  [name] is a 64 y.o. male with medical history significant for  HTN, OSA, chronic Hep C, Type 2 diabetes and ESRD on HD MW/F who presents with concerns of "not feeling well."  His work-up suggested pneumonia with chest x-ray showing right upper lobe mass suspicious for malignancy.  He also had COVID-19 infection and was admitted to the hospital.   Subjective:   Patient in recliner, appears comfortable, denies any headache, no fever, no chest pain or pressure, no shortness of breath , no abdominal pain. No focal weakness.    Assessment  & Plan :     1. Acute Hypoxic Resp. Failure, positive COVID-19 infection and possible mild pneumonia, also newly diagnosed right upper lobe mass suspicious for postobstructive bacterial pneumonia VS CAP with Strep pneumonia bacteremia -   I think his main issue is sepsis from right upper lobe bacterial pneumonia with strep bacteremia, he was extremely toxic and sick, procalcitonin and CRP were both elevated, he is on Decadron which I will continue, continue remdesivir, imaging suggested right-sided infiltrate versus mass.  He is responding well to IV antibiotics, ID on board, plan is to complete hemodialysis on 01/16/2019 1 in the morning, in the afternoon remove HD catheter, give him 1 day line holiday and then to replace a new tunneled HD catheter on 01/18/2019.  Repeat blood  cultures drawn on 01/14/2020 remain negative.  Procalcitonin trending down.  Encouraged the patient to sit up in chair in the daytime use I-S and flutter valve for pulmonary toiletry and then prone in bed when at night.   SpO2: 100 % O2 Flow Rate (L/min): 2 L/min  Recent Labs  Lab 01/11/20 1610 01/11/20 1615 01/11/20 1756  01/12/20 0849 01/13/20 8341 01/14/20 0238 01/15/20 0730  CRP 34.4*  --   --  42.4* 32.0* 20.2* 12.4*  DDIMER 10.25*  --   --  8.79* 6.32* 4.54* 2.75*  FERRITIN 1,284*  --   --   --   --   --   --   BNP 103.8*  --   --  320.2* 307.3* 476.9*  --   PROCALCITON  --   --  22.59 28.29 28.00 25.99  --   SARSCOV2NAA  --  POSITIVE*  --   --   --   --   --     Hepatic Function Latest Ref Rng & Units 01/15/2020 01/14/2020 01/13/2020  Total Protein 6.5 - 8.1 g/dL 7.4 6.8 7.3  Albumin 3.5 - 5.0 g/dL 2.7(L) 2.3(L) 2.4(L)  AST 15 - 41 U/L 14(L) 11(L) 13(L)  ALT 0 - 44 U/L '11 11 10  '$ Alk Phosphatase 38 - 126 U/L 102 75 88  Total Bilirubin 0.3 - 1.2 mg/dL 0.5 0.7 0.6  Bilirubin, Direct 0.0 - 0.2 mg/dL - - -    2.  Right upper lobe mass, history of right sided nephrectomy due to renal cell cancer in the past, history of smoking .  New diagnosis.  Her on CT more suspicious of infiltrate hence complete antibiotic course and then repeat imaging in 2 to 3 weeks by PCP if question persists outpatient pulmonary follow-up.  3.  ESRD, Monday Wednesday Friday schedule.  Nephrology consulted.  4.  Anemia of chronic disease.  Hemoglobin close to 8.  Will monitor.  No need for transfusion.  5.  Hyponatremia and hypernatremia.  Management per nephrology and HD.  6.  OSA.  Supplemental oxygen at night, does not use CPAP.  7.  Dyslipidemia.  On statin.  8.  Essential hypertension.  On combination of Norvasc, Coreg and Lasix continue.  Still makes some urine.  9.  Morbid obesity.  BMI 37.  Follow with PCP.  10.  Left foot Charcot joint, right lower extremity shallow 1 cm noninfected ulcer  present on admission.  Supportive care.   11.  Possible asymptomatic diverticulitis noted on CT scan.  Add Flagyl to Rocephin and monitor.  Symptom-free.   12.  DM type II.  SSI for now.  Poor outpatient glycemic control due to hyperglycemia, A1c of 10.5.  Diabetic and insulin education will be provided.  Lab Results  Component Value Date   HGBA1C 10.5 (H) 08/08/2018   CBG (last 3)  Recent Labs    01/14/20 1648 01/14/20 2142 01/15/20 0808  GLUCAP 184* 236* 241*       Condition - Extremely Guarded  Family Communication  : Wife updated 01/13/2020, 01/14/20  Code Status : Full code  Diet :    Diet Order            Diet renal with fluid restriction Fluid restriction: 1200 mL Fluid; Room service appropriate? Yes; Fluid consistency: Thin  Diet effective now               Disposition Plan  : Stay in the hospital, is septic with bacterial infection likely postobstructive pneumonia.  Has new lung mass which needs to be worked up highly suspicious for cancer.  Consults  : Pulmonary critical care called  Procedures  :    CT chest, abdomen and pelvis noncontrast   -   1. Right greater than left posterior upper lobe airspace and ground-glass opacity. Most consistent with pneumonia. This is not  atypical appearance of COVID-19 pneumonia, which cannot be excluded. Other infectious etiologies should also be considered. 2. Aortic atherosclerosis (ICD10-I70.0), coronary artery atherosclerosis and emphysema (ICD10-J43.9). 3. Mild thoracic adenopathy, likely reactive. 4. Pulmonary artery enlargement suggests pulmonary arterial hypertension.   CT ABDOMEN AND PELVIS IMPRESSION 1. Sigmoid wall thickening and pericolonic edema, likely related to non complicated diverticulitis. Separate edema posterior to the ascending colon is nonspecific and could represent an area of colitis or mild diverticulitis. 2. Hepatomegaly. Hepatic morphology, highly suspicious for cirrhosis.   PUD Prophylaxis :  PPI  DVT Prophylaxis  :   Heparin   Lab Results  Component Value Date   PLT 286 01/15/2020    Inpatient Medications  Scheduled Meds: . allopurinol  100 mg Oral BID  . amLODipine  5 mg Oral Daily  . carvedilol  6.25 mg Oral BID WC  . Chlorhexidine Gluconate Cloth  6 each Topical Daily  . Chlorhexidine Gluconate Cloth  6 each Topical Q0600  . cinacalcet  90 mg Oral Q M,W,F  . dexamethasone (DECADRON) injection  6 mg Intravenous Q24H  . diphenhydrAMINE  25 mg Oral Q M,W,F  . ferric citrate  420 mg Oral BID WC  . furosemide  40 mg Oral Daily  . heparin injection (subcutaneous)  7,500 Units Subcutaneous Q8H  . insulin aspart  0-15 Units Subcutaneous TID WC  . metroNIDAZOLE  500 mg Oral Q8H  . pantoprazole  40 mg Oral Daily  . pravastatin  20 mg Oral Daily   Continuous Infusions: . cefTRIAXone (ROCEPHIN)  IV 1 g (01/15/20 0851)  . chlorproMAZINE (THORAZINE) IV 25 mg (01/12/20 1949)   PRN Meds:.acetaminophen, chlorproMAZINE (THORAZINE) IV, heparin, mirtazapine, ondansetron  Antibiotics  :    Anti-infectives (From admission, onward)   Start     Dose/Rate Route Frequency Ordered Stop   01/14/20 1400  metroNIDAZOLE (FLAGYL) tablet 500 mg    Note to Pharmacy: Pharmacy can adjust for ESRD and diverticulitis   500 mg Oral Every 8 hours 01/14/20 1126     01/14/20 0800  cefTRIAXone (ROCEPHIN) 1 g in sodium chloride 0.9 % 100 mL IVPB    Note to Pharmacy: Pharmacy can adjust for ESRD, strep pneumonaue pneumonia   1 g 200 mL/hr over 30 Minutes Intravenous Every 24 hours 01/14/20 0751     01/13/20 1615  vancomycin (VANCOCIN) IVPB 1000 mg/200 mL premix     1,000 mg 200 mL/hr over 60 Minutes Intravenous  Once 01/13/20 1606 01/13/20 1806   01/12/20 1800  ceFEPIme (MAXIPIME) 1 g in sodium chloride 0.9 % 100 mL IVPB  Status:  Discontinued     1 g 200 mL/hr over 30 Minutes Intravenous Every 24 hours 01/12/20 1348 01/14/20 0750   01/12/20 1409  vancomycin variable dose per unstable renal  function (pharmacist dosing)  Status:  Discontinued      Does not apply See admin instructions 01/12/20 1409 01/14/20 0750   01/12/20 1000  remdesivir 100 mg in sodium chloride 0.9 % 100 mL IVPB     100 mg 200 mL/hr over 30 Minutes Intravenous Daily 01/11/20 1943 01/15/20 1159   01/12/20 0100  cefTRIAXone (ROCEPHIN) 1 g in sodium chloride 0.9 % 100 mL IVPB  Status:  Discontinued     1 g 200 mL/hr over 30 Minutes Intravenous Daily at bedtime 01/11/20 1903 01/12/20 1204   01/11/20 2030  remdesivir 100 mg in sodium chloride 0.9 % 100 mL IVPB  Status:  Discontinued     100 mg 200 mL/hr  over 30 Minutes Intravenous Every 1 hr x 2 01/11/20 1942 01/11/20 1944   01/11/20 2000  azithromycin (ZITHROMAX) 500 mg in sodium chloride 0.9 % 250 mL IVPB  Status:  Discontinued     500 mg 250 mL/hr over 60 Minutes Intravenous Every 24 hours 01/11/20 1903 01/12/20 1423   01/11/20 2000  remdesivir 200 mg in sodium chloride 0.9% 250 mL IVPB     200 mg 580 mL/hr over 30 Minutes Intravenous Once 01/11/20 1945 01/11/20 2130   01/11/20 1730  vancomycin (VANCOCIN) 2,500 mg in sodium chloride 0.9 % 500 mL IVPB     2,500 mg 250 mL/hr over 120 Minutes Intravenous  Once 01/11/20 1657 01/11/20 2005   01/11/20 1715  ceFEPIme (MAXIPIME) 1 g in sodium chloride 0.9 % 100 mL IVPB     1 g 200 mL/hr over 30 Minutes Intravenous  Once 01/11/20 1652 01/11/20 1806       Time Spent in minutes  30   Lala Lund M.D on 01/15/2020 at 12:16 PM  To page go to [email or web address] - password [email or web address]  [facility] -  Office  [phone]  See all Orders from today for further details    Objective:   Vitals:   01/14/20 1121 01/14/20 1538 01/14/20 2141 01/15/20 0523  BP: (!) 145/88 135/83 110/71 137/80  Pulse:  62 70 64  Resp:  20    Temp:  (!) 97.4 F (36.3 C) 97.7 F (36.5 C) 97.9 F (36.6 C)  TempSrc:  Oral Oral Oral  SpO2:  99% 98% 100%  Weight:      Height:        Wt Readings from Last 3 Encounters:  01/14/20  129 kg  01/11/20 128 kg  12/25/19 134.7 kg     Intake/Output Summary (Last 24 hours) at 01/15/2020 1216 Last data filed at 01/15/2020 0940 Gross per 24 hour  Intake 477 ml  Output --  Net 477 ml     Physical Exam Awake Alert, No new F.N deficits, Normal affect [place].AT,PERRAL Supple Neck,No JVD, No cervical lymphadenopathy appriciated.  Symmetrical Chest wall movement, Good air movement bilaterally, CTAB RRR,No Gallops, Rubs or new Murmurs, No Parasternal Heave +ve B.Sounds, Abd Soft, No tenderness, No organomegaly appriciated, No rebound - guarding or rigidity. L foot under UNNA boot for charcot's joint, small shallow noninfected right lower extremity lateral 1 cm in diameter ulcer, right subclavian HD catheter site appears clean.      Data Review:    CBC Recent Labs  Lab 01/11/20 0333 01/11/20 0333 01/11/20 1610 01/12/20 0849 01/13/20 0638 01/14/20 0238 01/15/20 0730  WBC 23.7*   < > 34.5* 36.5* 29.8* 25.8* 22.1*  HGB 10.5*   < > 9.0* 8.8* 8.9* 9.1* 10.4*  HCT 33.5*   < > 28.7* 27.6* 28.3* 29.2* 33.7*  PLT 383   < > 303 323 328 297 286  MCV 76.7*   < > 75.3* 74.6* 74.5* 74.3* 76.1*  MCH 24.0*   < > 23.6* 23.8* 23.4* 23.2* 23.5*  MCHC 31.3   < > 31.4 31.9 31.4 31.2 30.9  RDW 18.4*   < > 17.9* 17.4* 17.4* 17.4* 18.1*  LYMPHSABS 0.9  --  1.0  --  0.6* 0.7 PENDING  MONOABS 1.2*  --  1.5*  --  1.0 0.9 PENDING  EOSABS 0.1  --  0.0  --  0.0 0.0 PENDING  BASOSABS 0.1  --  0.1  --  0.1 0.1 PENDING   < > = values  in this interval not displayed.    Chemistries  Recent Labs  Lab 01/11/20 0333 01/11/20 0333 01/11/20 1610 01/11/20 1756 01/12/20 0849 01/13/20 0638 01/14/20 0238 01/15/20 0730  NA 128*   < > 130*  --  129* 133* 135 136  K 6.1*   < > 5.3*  --  4.8 4.1 4.5 4.1  CL 91*   < > 90*  --  91* 93* 98 98  CO2 16*   < > 22  --  19* 22 20* 20*  GLUCOSE 120*   < > 135*  --  209* 210* 230* 237*  BUN 83*   < > 57*  --  66* 46* 61* 60*  CREATININE 22.62*   < > 16.89*   --  17.51* 12.16* 13.74* 11.27*  CALCIUM 9.1   < > 8.5*  --  8.6* 9.4 8.8* 8.4*  MG  --   --   --   --  1.9 2.1 2.0 2.0  AST 15  --   --  18  --  13* 11* 14*  ALT 10  --   --  10  --  '10 11 11  '$ ALKPHOS 91  --   --  84  --  88 75 102  BILITOT 0.9  --   --  0.6  --  0.6 0.7 0.5   < > = values in this interval not displayed.   ------------------------------------------------------------------------------------------------------------------ No results for input(s): CHOL, HDL, LDLCALC, TRIG, CHOLHDL, LDLDIRECT in the last 72 hours.  Lab Results  Component Value Date   HGBA1C 10.5 (H) 08/08/2018   ------------------------------------------------------------------------------------------------------------------ No results for input(s): TSH, T4TOTAL, T3FREE, THYROIDAB in the last 72 hours.  Invalid input(s): FREET3  Cardiac Enzymes No results for input(s): CKMB, TROPONINI, MYOGLOBIN in the last 168 hours.  Invalid input(s): CK ------------------------------------------------------------------------------------------------------------------    Component Value Date/Time   BNP 476.9 (H) 01/14/2020 9629    Micro Results Recent Results (from the past 240 hour(s))  SARS CORONAVIRUS 2 (TAT 6-24 HRS) Nasopharyngeal Nasopharyngeal Swab     Status: None   Collection Time: 01/08/20 10:10 AM   Specimen: Nasopharyngeal Swab  Result Value Ref Range Status   SARS Coronavirus 2 NEGATIVE NEGATIVE Final    Comment: (NOTE) SARS-CoV-2 target nucleic acids are NOT DETECTED. The SARS-CoV-2 RNA is generally detectable in upper and lower respiratory specimens during the acute phase of infection. Negative results do not preclude SARS-CoV-2 infection, do not rule out co-infections with other pathogens, and should not be used as the sole basis for treatment or other patient management decisions. Negative results must be combined with clinical observations, patient history, and epidemiological  information. The expected result is Negative. Fact Sheet for Patients: SugarRoll.be Fact Sheet for Healthcare Providers: https://www.woods-mathews.com/ This test is not yet approved or cleared by the [place] FDA and  has been authorized for detection and/or diagnosis of SARS-CoV-2 by FDA under an Emergency Use Authorization (EUA). This EUA will remain  in effect (meaning this test can be used) for the duration of the COVID-19 declaration under Section 56 4(b)(1) of the Act, 21 U.S.C. section 360bbb-3(b)(1), unless the authorization is terminated or revoked sooner. Performed at [facility], [place] [address] [place], [place] [address]   Respiratory Panel by RT PCR (Flu A&B, Covid) - Nasopharyngeal Swab     Status: Abnormal   Collection Time: 01/11/20  4:15 PM   Specimen: Nasopharyngeal Swab  Result Value Ref Range Status   SARS Coronavirus 2  by RT PCR POSITIVE (A) NEGATIVE Final    Comment: RESULT CALLED TO, READ BACK BY AND VERIFIED WITH: Kathryne Sharper RN [id number] 01/11/20 JM (NOTE) SARS-CoV-2 target nucleic acids are DETECTED. SARS-CoV-2 RNA is generally detectable in upper respiratory specimens  during the acute phase of infection. Positive results are indicative of the presence of the identified virus, but do not rule out bacterial infection or co-infection with other pathogens not detected by the test. Clinical correlation with patient history and other diagnostic information is necessary to determine patient infection status. The expected result is Negative. Fact Sheet for Patients:  PinkCheek.be Fact Sheet for Healthcare Providers: GravelBags.it This test is not yet approved or cleared by the [place] FDA and  has been authorized for detection and/or diagnosis of SARS-CoV-2 by FDA under an Emergency Use Authorization (EUA).  This EUA will remain in effect (meaning  this test can be used) for th e duration of  the COVID-19 declaration under Section 564(b)(1) of the Act, 21 U.S.C. section 360bbb-3(b)(1), unless the authorization is terminated or revoked sooner.    Influenza A by PCR NEGATIVE NEGATIVE Final   Influenza B by PCR NEGATIVE NEGATIVE Final    Comment: (NOTE) The Xpert Xpress SARS-CoV-2/FLU/RSV assay is intended as an aid in  the diagnosis of influenza from Nasopharyngeal swab specimens and  should not be used as a sole basis for treatment. Nasal washings and  aspirates are unacceptable for Xpert Xpress SARS-CoV-2/FLU/RSV  testing. Fact Sheet for Patients: PinkCheek.be Fact Sheet for Healthcare Providers: GravelBags.it This test is not yet approved or cleared by the [place] FDA and  has been authorized for detection and/or diagnosis of SARS-CoV-2 by  FDA under an Emergency Use Authorization (EUA). This EUA will remain  in effect (meaning this test can be used) for the duration of the  Covid-19 declaration under Section 564(b)(1) of the Act, 21  U.S.C. section 360bbb-3(b)(1), unless the authorization is  terminated or revoked. Performed at [facility], [place] [address], [place], [place] [address]   Culture, blood (routine x 2)     Status: Abnormal   Collection Time: 01/11/20  4:52 PM   Specimen: BLOOD  Result Value Ref Range Status   Specimen Description   Final    BLOOD RIGHT ANTECUBITAL Performed at [facility] [address] [place], [place] [address]    Special Requests   Final    BOTTLES DRAWN AEROBIC AND ANAEROBIC Blood Culture adequate volume Performed at [facility] [address] [place], [place] [address]    Culture  Setup Time   Final    GRAM POSITIVE COCCI IN CHAINS AEROBIC BOTTLE ONLY CRITICAL RESULT CALLED TO, READ BACK BY AND VERIFIED WITH: PHARMD L CHEN [id number] AT 1011 BY CM Performed at  [facility], [place] [address] [place], [place] [address]    Culture STREPTOCOCCUS PNEUMONIAE (A)  Final   Report Status 01/14/2020 FINAL  Final   Organism ID, Bacteria STREPTOCOCCUS PNEUMONIAE  Final      Susceptibility   Streptococcus pneumoniae - MIC*    ERYTHROMYCIN <=0.12 SENSITIVE Sensitive     LEVOFLOXACIN 0.5 SENSITIVE Sensitive     VANCOMYCIN 0.5 SENSITIVE Sensitive     PENICILLIN (meningitis) 0.12 RESISTANT Resistant     PENO - penicillin 0.12      PENICILLIN (non-meningitis) 0.12 SENSITIVE Sensitive     PENICILLIN (oral) 0.12 INTERMEDIATE Intermediate     CEFTRIAXONE (non-meningitis) <=0.12 SENSITIVE Sensitive  CEFTRIAXONE (meningitis) <=0.12 SENSITIVE Sensitive     * STREPTOCOCCUS PNEUMONIAE  Culture, blood (routine x 2)     Status: None (Preliminary result)   Collection Time: 01/11/20  5:46 PM   Specimen: BLOOD LEFT HAND  Result Value Ref Range Status   Specimen Description   Final    BLOOD LEFT HAND Performed at [facility] [address], [place], [place] [address]    Special Requests   Final    BOTTLES DRAWN AEROBIC ONLY Blood Culture adequate volume Performed at [facility] [address] [place], [place] [address]    Culture   Final    NO GROWTH 4 DAYS Performed at [facility], [place] [address], [place], [place] [address]    Report Status PENDING  Incomplete  MRSA PCR Screening     Status: None   Collection Time: 01/12/20  2:28 AM   Specimen: Nasal Mucosa; Nasopharyngeal  Result Value Ref Range Status   MRSA by PCR NEGATIVE NEGATIVE Final    Comment:        The GeneXpert MRSA Assay (FDA approved for NASAL specimens only), is one component of a comprehensive MRSA colonization surveillance program. It is not intended to diagnose MRSA infection nor to guide or monitor treatment for MRSA infections. Performed at [facility], [place] [address] [place], [place] [address]   Culture, blood  (routine x 2)     Status: None (Preliminary result)   Collection Time: 01/14/20 12:10 PM   Specimen: BLOOD  Result Value Ref Range Status   Specimen Description BLOOD LEFT ANTECUBITAL  Final   Special Requests   Final    BOTTLES DRAWN AEROBIC ONLY Blood Culture results may not be optimal due to an inadequate volume of blood received in culture bottles   Culture   Final    NO GROWTH < 24 HOURS Performed at [facility], [place] [address] [place], [place] [address]    Report Status PENDING  Incomplete  Culture, blood (routine x 2)     Status: None (Preliminary result)   Collection Time: 01/14/20 12:13 PM   Specimen: BLOOD LEFT HAND  Result Value Ref Range Status   Specimen Description BLOOD LEFT HAND  Final   Special Requests   Final    BOTTLES DRAWN AEROBIC ONLY Blood Culture results may not be optimal due to an inadequate volume of blood received in culture bottles   Culture   Final    NO GROWTH < 24 HOURS Performed at [facility], [place] [address] [place], [place] [address]    Report Status PENDING  Incomplete    Radiology Reports CT ABDOMEN PELVIS WO CONTRAST  Result Date: 01/13/2020 CLINICAL DATA:  Follow-up of COVID pneumonia. Lung nodule on chest radiograph. Diffuse abdominal pain. EXAM: CT CHEST, ABDOMEN AND PELVIS WITHOUT CONTRAST TECHNIQUE: Multidetector CT imaging of the chest, abdomen and pelvis was performed following the standard protocol without IV contrast. COMPARISON:  Chest radiograph 01/12/2020. CT of the abdomen and pelvis of 12/05/2018. CTA of the chest of 03/04/2017. FINDINGS: CT CHEST FINDINGS Cardiovascular: A left sided central line terminates in the right atrium. Aortic atherosclerosis. Tortuous thoracic aorta. Moderate cardiomegaly, without pericardial effusion. Multivessel coronary artery atherosclerosis. Pulmonary artery enlargement, outflow tract 3.6 cm. 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 Mediastinum/Nodes: Right  paratracheal node of 1.1 cm Hilar regions poorly evaluated without intravenous contrast. Lungs/Pleura: No pleural fluid.  Mild centrilobular emphysema. Right  greater than left peripheral upper lobe airspace disease with mild surrounding ground-glass. Musculoskeletal: No acute osseous abnormality. CT ABDOMEN PELVIS FINDINGS Hepatobiliary: Hepatomegaly at 19.2 cm craniocaudal. Hepatic morphology, including caudate lobe enlargement and irregular hepatic capsule, highly suspicious for mild cirrhosis. Normal gallbladder, without biliary ductal dilatation. Pancreas: Normal, without mass or ductal dilatation. Spleen: Normal in size, without focal abnormality. Adrenals/Urinary Tract: Mild bilateral adrenal thickening. Native renal atrophy on the left. Right nephrectomy. Multiple low-density left renal lesions are likely cysts. No bladder calculi. Stomach/Bowel: Normal stomach, without wall thickening. Scattered colonic diverticula. Wall thickening within the sigmoid with mild surrounding edema, including on 95/3. No pericolonic fluid collection or free perforation. Separate area of pericolonic edema posterior to the ascending colon including on 84/3. Vascular/Lymphatic: Aortic and branch vessel atherosclerosis. Reproductive: No abdominopelvic adenopathy.  Normal prostate. Other: Fat containing left inguinal hernia. No significant free fluid. Tiny periumbilical fat containing ventral abdominal wall laxity. Musculoskeletal: Renal osteodystrophy.  Lumbosacral spondylosis. IMPRESSION: CT CHEST IMPRESSION 1. Right greater than left posterior upper lobe airspace and ground-glass opacity. Most consistent with pneumonia. This is not atypical appearance of COVID-19 pneumonia, which cannot be excluded. Other infectious etiologies should also be considered. 2. Aortic atherosclerosis (ICD10-I70.0), coronary artery atherosclerosis and emphysema (ICD10-J43.9). 3. Mild thoracic adenopathy, likely reactive. 4. Pulmonary artery enlargement  suggests pulmonary arterial hypertension. CT ABDOMEN AND PELVIS IMPRESSION 1. Sigmoid wall thickening and pericolonic edema, likely related to non complicated diverticulitis. Separate edema posterior to the ascending colon is nonspecific and could represent an area of colitis or mild diverticulitis. 2. Hepatomegaly. Hepatic morphology, highly suspicious for cirrhosis. Electronically Signed   By: Abigail Miyamoto M.D.   On: 01/13/2020 12:36   CT CHEST WO CONTRAST  Result Date: 01/13/2020 CLINICAL DATA:  Follow-up of COVID pneumonia. Lung nodule on chest radiograph. Diffuse abdominal pain. EXAM: CT CHEST, ABDOMEN AND PELVIS WITHOUT CONTRAST TECHNIQUE: Multidetector CT imaging of the chest, abdomen and pelvis was performed following the standard protocol without IV contrast. COMPARISON:  Chest radiograph 01/12/2020. CT of the abdomen and pelvis of 12/05/2018. CTA of the chest of 03/04/2017. FINDINGS: CT CHEST FINDINGS Cardiovascular: A left sided central line terminates in the right atrium. Aortic atherosclerosis. Tortuous thoracic aorta. Moderate cardiomegaly, without pericardial effusion. Multivessel coronary artery atherosclerosis. Pulmonary artery enlargement, outflow tract 3.6 cm. 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 Mediastinum/Nodes: Right paratracheal node of 1.1 cm Hilar regions poorly evaluated without intravenous contrast. Lungs/Pleura: No pleural fluid.  Mild centrilobular emphysema. Right greater than left peripheral upper lobe airspace disease with mild surrounding ground-glass. Musculoskeletal: No acute osseous abnormality. CT ABDOMEN PELVIS FINDINGS Hepatobiliary: Hepatomegaly at 19.2 cm craniocaudal. Hepatic morphology, including caudate lobe enlargement and irregular hepatic capsule, highly suspicious for mild cirrhosis. Normal gallbladder, without biliary ductal dilatation. Pancreas: Normal, without mass or ductal dilatation. Spleen: Normal in size, without focal  abnormality. Adrenals/Urinary Tract: Mild bilateral adrenal thickening. Native renal atrophy on the left. Right nephrectomy. Multiple low-density left renal lesions are likely cysts. No bladder calculi. Stomach/Bowel: Normal stomach, without wall thickening. Scattered colonic diverticula. Wall thickening within the sigmoid with mild surrounding edema, including on 95/3. No pericolonic fluid collection or free perforation. Separate area of pericolonic edema posterior to the ascending colon including on 84/3. Vascular/Lymphatic: Aortic and branch vessel atherosclerosis. Reproductive: No abdominopelvic adenopathy.  Normal prostate. Other: Fat containing left inguinal hernia. No significant free fluid. Tiny periumbilical fat containing ventral abdominal wall laxity. Musculoskeletal: Renal osteodystrophy.  Lumbosacral spondylosis. IMPRESSION: CT CHEST IMPRESSION 1. Right greater  than left posterior upper lobe airspace and ground-glass opacity. Most consistent with pneumonia. This is not atypical appearance of COVID-19 pneumonia, which cannot be excluded. Other infectious etiologies should also be considered. 2. Aortic atherosclerosis (ICD10-I70.0), coronary artery atherosclerosis and emphysema (ICD10-J43.9). 3. Mild thoracic adenopathy, likely reactive. 4. Pulmonary artery enlargement suggests pulmonary arterial hypertension. CT ABDOMEN AND PELVIS IMPRESSION 1. Sigmoid wall thickening and pericolonic edema, likely related to non complicated diverticulitis. Separate edema posterior to the ascending colon is nonspecific and could represent an area of colitis or mild diverticulitis. 2. Hepatomegaly. Hepatic morphology, highly suspicious for cirrhosis. Electronically Signed   By: Abigail Miyamoto M.D.   On: 01/13/2020 12:36   DG Chest Port 1 View  Result Date: 01/14/2020 CLINICAL DATA:  Shortness of breath, COVID positive EXAM: PORTABLE CHEST 1 VIEW COMPARISON:  01/12/2020 FINDINGS: Left dialysis catheter remains in place,  unchanged. Patchy airspace disease in the right upper lobe again noted. Overall, airspace disease throughout the right lung has improved. No confluent opacity on the left. No effusions. No acute bony abnormality. Heart is borderline in size. IMPRESSION: Improving airspace disease with mild residual patchy right upper lobe opacities. Electronically Signed   By: Rolm Baptise M.D.   On: 01/14/2020 11:33   DG Chest Port 1 View  Result Date: 01/12/2020 CLINICAL DATA:  Short of breath EXAM: PORTABLE CHEST 1 VIEW COMPARISON:  01/11/2020 FINDINGS: Right upper lobe airspace disease unchanged. Improved aeration in the lung bases with improved lung volume. Negative for edema or effusion. Central venous catheter tip in the right atrium unchanged. IMPRESSION: Persistent right upper lobe airspace disease unchanged Improved aeration lungs with decrease in bibasilar atelectasis. Electronically Signed   By: Franchot Gallo M.D.   On: 01/12/2020 09:16   DG Chest Port 1 View  Result Date: 01/11/2020 CLINICAL DATA:  Shortness of breath EXAM: PORTABLE CHEST 1 VIEW COMPARISON:  01/10/2019 FINDINGS: There is bilateral diffuse interstitial thickening. There is a nodular appearance along the right suprahilar region likely reflecting prominent pulmonary vasculature or airspace disease. There is no pleural effusion or pneumothorax. The heart mediastinum are stable. There is a large bore left-sided central venous catheter with the tip projecting over the cavoatrial junction. There is no acute osseous abnormality. IMPRESSION: Diffuse bilateral interstitial thickening likely reflecting interstitial edema. Nodular appearance along the right suprahilar region likely reflecting prominent pulmonary vasculature or airspace disease. Electronically Signed   By: Kathreen Devoid   On: 01/11/2020 16:31   DG Chest Port 1 View  Result Date: 01/11/2020 CLINICAL DATA:  Cough, short of breath EXAM: PORTABLE CHEST 1 VIEW COMPARISON:  03/07/2017  FINDINGS: Single frontal view of the chest demonstrates a left internal jugular dialysis catheter tip overlying atrial caval junction. Vascular stent overlies the right axilla. Cardiac silhouette is stable. There is a 7.3 cm mass projecting over the right anterior first and second ribs. CT chest with IV contrast recommended for suspected neoplasm. There is central vascular congestion with diffuse interstitial prominence consistent with mild fluid overload. Right hilar prominence could also reflect lymphadenopathy. No effusion or pneumothorax. No acute bony abnormality. IMPRESSION: 1. 7.3 cm right upper lobe mass consistent with neoplasm. CT chest with IV contrast is recommended. 2. Right hilar prominence could reflect underlying adenopathy. 3. Vascular congestion and interstitial edema. Electronically Signed   By: Randa Ngo M.D.   On: 01/11/2020 04:02   VAS [place] UPPER EXTREMITY ARTERIAL DUPLEX  Result Date: 12/25/2019 UPPER EXTREMITY DUPLEX STUDY Indications: Pre-operative exam.  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 Bilateral testing is considered an integral part of a complete examination. Limited examinations for reoccurring indications may be performed as noted.  Right Pre-Dialysis Findings: +-----------------------+----------+--------------------+---------+--------+ Location               PSV (cm/s)Intralum. Diam. (cm)Waveform Comments +-----------------------+----------+--------------------+---------+--------+ Brachial Antecub. fossa40        0.46                triphasic         +-----------------------+----------+--------------------+---------+--------+ Radial Art at Wrist    35        0.29                 triphasic         +-----------------------+----------+--------------------+---------+--------+ Ulnar Art at Wrist     81        0.27                triphasic         +-----------------------+----------+--------------------+---------+--------+ +------------+-----------------+ Allen's TestBranches at fossa +------------+-----------------+ Left Pre-Dialysis Findings: +-----------------------+----------+--------------------+---------+--------+ Location               PSV (cm/s)Intralum. Diam. (cm)Waveform Comments +-----------------------+----------+--------------------+---------+--------+ Brachial Antecub. fossa54        0.52                triphasic         +-----------------------+----------+--------------------+---------+--------+ Radial Art at Wrist    44        0.27                triphasictortuous +-----------------------+----------+--------------------+---------+--------+ Ulnar Art at Wrist     73        0.19                triphasic         +-----------------------+----------+--------------------+---------+--------+ +------------+-----------------+ Allen's TestBranches at fossa +------------+-----------------+  Summary:   Measurements above. *See table(s) above for measurements and observations. Electronically signed by Curt Jews MD on 12/25/2019 at 9:30:15 AM.    Final    VAS [place] UPPER EXTREMITY VEIN MAPPING  Result Date: 12/25/2019 UPPER EXTREMITY VEIN MAPPING  Indications: Pre-access. History: History of right Radiocephalic fistula years ago.  Performing Technologist: Alvia Grove RVT  Examination Guidelines: A complete evaluation includes B-mode imaging, spectral Doppler, color Doppler, and power Doppler as needed of all accessible portions of each vessel. Bilateral testing is considered an integral part of a complete examination. Limited examinations for reoccurring indications may be performed as noted.  +-----------------+-------------+----------+--------+ Right Cephalic   Diameter (cm)Depth (cm)Findings +-----------------+-------------+----------+--------+ Prox upper arm                             [place]    +-----------------+-------------+----------+--------+ Mid upper arm                              [place]    +-----------------+-------------+----------+--------+ Dist upper arm                             [place]    +-----------------+-------------+----------+--------+ Antecubital fossa  [place]    +-----------------+-------------+----------+--------+ +-----------------+-------------+----------+---------+ Right Basilic    Diameter (cm)Depth (cm)Findings  +-----------------+-------------+----------+---------+ Prox upper arm       0.46                         +-----------------+-------------+----------+---------+ Mid upper arm        0.68               branching +-----------------+-------------+----------+---------+ Dist upper arm       0.34                         +-----------------+-------------+----------+---------+ Antecubital fossa    0.31                         +-----------------+-------------+----------+---------+ Prox forearm         0.27                         +-----------------+-------------+----------+---------+ +-----------------+-------------+----------+--------+ Left Cephalic    Diameter (cm)Depth (cm)Findings +-----------------+-------------+----------+--------+ Shoulder             0.47                        +-----------------+-------------+----------+--------+ Prox upper arm       0.36                        +-----------------+-------------+----------+--------+ Mid upper arm        0.41                        +-----------------+-------------+----------+--------+ Dist upper arm       0.43                        +-----------------+-------------+----------+--------+ Antecubital fossa    0.54                         +-----------------+-------------+----------+--------+ Prox forearm         0.43                        +-----------------+-------------+----------+--------+ Mid forearm          0.38                        +-----------------+-------------+----------+--------+ Dist forearm         0.33                        +-----------------+-------------+----------+--------+ +-----------------+-------------+----------+--------+ Left Basilic     Diameter (cm)Depth (cm)Findings +-----------------+-------------+----------+--------+ Prox upper arm       0.48                        +-----------------+-------------+----------+--------+ Mid upper arm        0.51                        +-----------------+-------------+----------+--------+ Dist upper arm       0.47                        +-----------------+-------------+----------+--------+ Antecubital fossa    0.26                        +-----------------+-------------+----------+--------+  Prox forearm         0.25                        +-----------------+-------------+----------+--------+ Summary: Right: Occluded right forearm fistula. Thrombus in the cephalic vein        from the antecubital fossa extending proximally into the        subclavian vein, appearing chronic. Left: Measurements above. *See table(s) above for measurements and observations.  Diagnosing physician: Curt Jews MD Electronically signed by Curt Jews MD on 12/25/2019 at 9:27:41 AM.    Final

## 2020-01-15 NOTE — Progress Notes (Signed)
[facility] NEPHROLOGY PROGRESS NOTE  Assessment/ Plan: Pt is a 64 y.o. yo male    Outpt HD:M[facility] [place], 5h 550/800 132kg 2/2 bath P4 AVG Hep 10K+ 2K midrunCalcitriol 3.25 ug tiw,sensipar 180 qhd,venofer 50 /wk,mirc 60 q 2, last 2/22  #COVID-19 pneumonia/acute hypoxic respiratory failure: On remdesivir, antibiotics per primary team.    # Streptococcal bacteremia: [place] site looks clean repeat culture negative so far. D/w primary team who consulted ID. Plan to remove [facility] tomorrow after HD and then need new perm cath for HD on Friday. On ceftriaxone.   # ESRD MWF schedule: Status post HD yesterday with UF 2000 cc. Next HD tomorrow.   # Anemia of CKD: Continue ESA. Hb at goal.  Monitor hemoglobin.  Received Mircera on 2/22.  # Secondary hyperparathyroidism: On Sensipar, Auryxia.  Monitor Ca, phos level.   # HTN/volume: Continue current antihypertensive medication and the UF during HD.  Subjective: Seen and examined at bedside.  No new event.  Denies nausea vomiting chest pain shortness of breath.   Objective Vital signs in last 24 hours: Vitals:   01/14/20 1121 01/14/20 1538 01/14/20 2141 01/15/20 0523  BP: (!) 145/88 135/83 110/71 137/80  Pulse:  62 70 64  Resp:  20    Temp:  (!) 97.4 F (36.3 C) 97.7 F (36.5 C) 97.9 F (36.6 C)  TempSrc:  Oral Oral Oral  SpO2:  99% 98% 100%  Weight:      Height:       Weight change: -0.3 kg  Intake/Output Summary (Last 24 hours) at 01/15/2020 1126 Last data filed at 01/15/2020 0940 Gross per 24 hour  Intake 477 ml  Output --  Net 477 ml       Labs: Basic Metabolic Panel: Recent Labs  Lab 01/13/20 0638 01/14/20 0238 01/15/20 0730  NA 133* 135 136  K 4.1 4.5 4.1  CL 93* 98 98  CO2 22 20* 20*  GLUCOSE 210* 230* 237*  BUN 46* 61* 60*  CREATININE 12.16* 13.74* 11.27*  CALCIUM 9.4 8.8* 8.4*   Liver Function Tests: Recent Labs  Lab 01/13/20 0638 01/14/20 0238 01/15/20 0730  AST 13* 11* 14*  ALT 10  11 11   ALKPHOS 88 75 102  BILITOT 0.6 0.7 0.5  PROT 7.3 6.8 7.4  ALBUMIN 2.4* 2.3* 2.7*   No results for input(s): LIPASE, AMYLASE in the last 168 hours. No results for input(s): AMMONIA in the last 168 hours. CBC: Recent Labs  Lab 01/11/20 1610 01/11/20 1610 01/12/20 0849 01/12/20 0849 01/13/20 0638 01/14/20 0238 01/15/20 0730  WBC 34.5*   < > 36.5*   < > 29.8* 25.8* 22.1*  NEUTROABS 29.8*   < >  --   --  27.0* 22.7* PENDING  HGB 9.0*   < > 8.8*   < > 8.9* 9.1* 10.4*  HCT 28.7*   < > 27.6*   < > 28.3* 29.2* 33.7*  MCV 75.3*  --  74.6*  --  74.5* 74.3* 76.1*  PLT 303   < > 323   < > 328 297 286   < > = values in this interval not displayed.   Cardiac Enzymes: No results for input(s): CKTOTAL, CKMB, CKMBINDEX, TROPONINI in the last 168 hours. CBG: Recent Labs  Lab 01/14/20 0625 01/14/20 1138 01/14/20 1648 01/14/20 2142 01/15/20 0808  GLUCAP 206* 189* 184* 236* 241*    Iron Studies:  No results for input(s): IRON, TIBC, TRANSFERRIN, FERRITIN in the last 72 hours. Studies/Results:  CT ABDOMEN PELVIS WO CONTRAST  Result Date: 01/13/2020 CLINICAL DATA:  Follow-up of COVID pneumonia. Lung nodule on chest radiograph. Diffuse abdominal pain. EXAM: CT CHEST, ABDOMEN AND PELVIS WITHOUT CONTRAST TECHNIQUE: Multidetector CT imaging of the chest, abdomen and pelvis was performed following the standard protocol without IV contrast. COMPARISON:  Chest radiograph 01/12/2020. CT of the abdomen and pelvis of 12/05/2018. CTA of the chest of 03/04/2017. FINDINGS: CT CHEST FINDINGS Cardiovascular: A left sided central line terminates in the right atrium. Aortic atherosclerosis. Tortuous thoracic aorta. Moderate cardiomegaly, without pericardial effusion. Multivessel coronary artery atherosclerosis. Pulmonary artery enlargement, outflow tract 3.6 cm. 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 Mediastinum/Nodes: Right paratracheal node of 1.1 cm Hilar regions poorly  evaluated without intravenous contrast. Lungs/Pleura: No pleural fluid.  Mild centrilobular emphysema. Right greater than left peripheral upper lobe airspace disease with mild surrounding ground-glass. Musculoskeletal: No acute osseous abnormality. CT ABDOMEN PELVIS FINDINGS Hepatobiliary: Hepatomegaly at 19.2 cm craniocaudal. Hepatic morphology, including caudate lobe enlargement and irregular hepatic capsule, highly suspicious for mild cirrhosis. Normal gallbladder, without biliary ductal dilatation. Pancreas: Normal, without mass or ductal dilatation. Spleen: Normal in size, without focal abnormality. Adrenals/Urinary Tract: Mild bilateral adrenal thickening. Native renal atrophy on the left. Right nephrectomy. Multiple low-density left renal lesions are likely cysts. No bladder calculi. Stomach/Bowel: Normal stomach, without wall thickening. Scattered colonic diverticula. Wall thickening within the sigmoid with mild surrounding edema, including on 95/3. No pericolonic fluid collection or free perforation. Separate area of pericolonic edema posterior to the ascending colon including on 84/3. Vascular/Lymphatic: Aortic and branch vessel atherosclerosis. Reproductive: No abdominopelvic adenopathy.  Normal prostate. Other: Fat containing left inguinal hernia. No significant free fluid. Tiny periumbilical fat containing ventral abdominal wall laxity. Musculoskeletal: Renal osteodystrophy.  Lumbosacral spondylosis. IMPRESSION: CT CHEST IMPRESSION 1. Right greater than left posterior upper lobe airspace and ground-glass opacity. Most consistent with pneumonia. This is not atypical appearance of COVID-19 pneumonia, which cannot be excluded. Other infectious etiologies should also be considered. 2. Aortic atherosclerosis (ICD10-I70.0), coronary artery atherosclerosis and emphysema (ICD10-J43.9). 3. Mild thoracic adenopathy, likely reactive. 4. Pulmonary artery enlargement suggests pulmonary arterial hypertension. CT  ABDOMEN AND PELVIS IMPRESSION 1. Sigmoid wall thickening and pericolonic edema, likely related to non complicated diverticulitis. Separate edema posterior to the ascending colon is nonspecific and could represent an area of colitis or mild diverticulitis. 2. Hepatomegaly. Hepatic morphology, highly suspicious for cirrhosis. Electronically Signed   By: Abigail Miyamoto M.D.   On: 01/13/2020 12:36   CT CHEST WO CONTRAST  Result Date: 01/13/2020 CLINICAL DATA:  Follow-up of COVID pneumonia. Lung nodule on chest radiograph. Diffuse abdominal pain. EXAM: CT CHEST, ABDOMEN AND PELVIS WITHOUT CONTRAST TECHNIQUE: Multidetector CT imaging of the chest, abdomen and pelvis was performed following the standard protocol without IV contrast. COMPARISON:  Chest radiograph 01/12/2020. CT of the abdomen and pelvis of 12/05/2018. CTA of the chest of 03/04/2017. FINDINGS: CT CHEST FINDINGS Cardiovascular: A left sided central line terminates in the right atrium. Aortic atherosclerosis. Tortuous thoracic aorta. Moderate cardiomegaly, without pericardial effusion. Multivessel coronary artery atherosclerosis. Pulmonary artery enlargement, outflow tract 3.6 cm. 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 Mediastinum/Nodes: Right paratracheal node of 1.1 cm Hilar regions poorly evaluated without intravenous contrast. Lungs/Pleura: No pleural fluid.  Mild centrilobular emphysema. Right greater than left peripheral upper lobe airspace disease with mild surrounding ground-glass. Musculoskeletal: No acute osseous abnormality. CT ABDOMEN PELVIS FINDINGS Hepatobiliary: Hepatomegaly at 19.2 cm craniocaudal. Hepatic morphology, including caudate lobe enlargement and  irregular hepatic capsule, highly suspicious for mild cirrhosis. Normal gallbladder, without biliary ductal dilatation. Pancreas: Normal, without mass or ductal dilatation. Spleen: Normal in size, without focal abnormality. Adrenals/Urinary Tract: Mild  bilateral adrenal thickening. Native renal atrophy on the left. Right nephrectomy. Multiple low-density left renal lesions are likely cysts. No bladder calculi. Stomach/Bowel: Normal stomach, without wall thickening. Scattered colonic diverticula. Wall thickening within the sigmoid with mild surrounding edema, including on 95/3. No pericolonic fluid collection or free perforation. Separate area of pericolonic edema posterior to the ascending colon including on 84/3. Vascular/Lymphatic: Aortic and branch vessel atherosclerosis. Reproductive: No abdominopelvic adenopathy.  Normal prostate. Other: Fat containing left inguinal hernia. No significant free fluid. Tiny periumbilical fat containing ventral abdominal wall laxity. Musculoskeletal: Renal osteodystrophy.  Lumbosacral spondylosis. IMPRESSION: CT CHEST IMPRESSION 1. Right greater than left posterior upper lobe airspace and ground-glass opacity. Most consistent with pneumonia. This is not atypical appearance of COVID-19 pneumonia, which cannot be excluded. Other infectious etiologies should also be considered. 2. Aortic atherosclerosis (ICD10-I70.0), coronary artery atherosclerosis and emphysema (ICD10-J43.9). 3. Mild thoracic adenopathy, likely reactive. 4. Pulmonary artery enlargement suggests pulmonary arterial hypertension. CT ABDOMEN AND PELVIS IMPRESSION 1. Sigmoid wall thickening and pericolonic edema, likely related to non complicated diverticulitis. Separate edema posterior to the ascending colon is nonspecific and could represent an area of colitis or mild diverticulitis. 2. Hepatomegaly. Hepatic morphology, highly suspicious for cirrhosis. Electronically Signed   By: Abigail Miyamoto M.D.   On: 01/13/2020 12:36   DG Chest Port 1 View  Result Date: 01/14/2020 CLINICAL DATA:  Shortness of breath, COVID positive EXAM: PORTABLE CHEST 1 VIEW COMPARISON:  01/12/2020 FINDINGS: Left dialysis catheter remains in place, unchanged. Patchy airspace disease in the  right upper lobe again noted. Overall, airspace disease throughout the right lung has improved. No confluent opacity on the left. No effusions. No acute bony abnormality. Heart is borderline in size. IMPRESSION: Improving airspace disease with mild residual patchy right upper lobe opacities. Electronically Signed   By: Rolm Baptise M.D.   On: 01/14/2020 11:33    Medications: Infusions: . cefTRIAXone (ROCEPHIN)  IV 1 g (01/15/20 0851)  . chlorproMAZINE (THORAZINE) IV 25 mg (01/12/20 1949)  . remdesivir 100 mg in NS 100 mL 100 mg (01/14/20 1523)    Scheduled Medications: . allopurinol  100 mg Oral BID  . amLODipine  5 mg Oral Daily  . carvedilol  6.25 mg Oral BID WC  . Chlorhexidine Gluconate Cloth  6 each Topical Daily  . cinacalcet  90 mg Oral Q M,W,F  . dexamethasone (DECADRON) injection  6 mg Intravenous Q24H  . diphenhydrAMINE  25 mg Oral Q M,W,F  . ferric citrate  420 mg Oral BID WC  . furosemide  40 mg Oral Daily  . heparin injection (subcutaneous)  7,500 Units Subcutaneous Q8H  . insulin aspart  0-15 Units Subcutaneous TID WC  . metroNIDAZOLE  500 mg Oral Q8H  . pantoprazole  40 mg Oral Daily  . pravastatin  20 mg Oral Daily    have reviewed scheduled and prn medications.  Physical Exam: General:NAD, comfortable Heart:RRR, s1s2 nl Lungs: No increased work of breathing, some basal coarse breath sound. Abdomen:soft, Non-tender, non-distended Extremities: Trace LE edema Dialysis Access: Right IJ TDC.  Site looks clean, no tenderness  W[place] Tanna Furry 01/15/2020,11:26 AM  LOS: 4 days  Pager: [phone]

## 2020-01-16 ENCOUNTER — Inpatient Hospital Stay (HOSPITAL_COMMUNITY): Payer: Medicare Other

## 2020-01-16 HISTORY — PX: IR REMOVAL TUN CV CATH W/O FL: IMG2289

## 2020-01-16 LAB — CBC WITH DIFFERENTIAL/PLATELET
Abs Immature Granulocytes: 0.6 10*3/uL — ABNORMAL HIGH (ref 0.00–0.07)
Basophils Absolute: 0 10*3/uL (ref 0.0–0.1)
Basophils Relative: 0 %
Eosinophils Absolute: 0 10*3/uL (ref 0.0–0.5)
Eosinophils Relative: 0 %
HCT: 34.1 % — ABNORMAL LOW (ref 39.0–52.0)
Hemoglobin: 10.4 g/dL — ABNORMAL LOW (ref 13.0–17.0)
Lymphocytes Relative: 4 %
Lymphs Abs: 0.9 10*3/uL (ref 0.7–4.0)
MCH: 23.1 pg — ABNORMAL LOW (ref 26.0–34.0)
MCHC: 30.5 g/dL (ref 30.0–36.0)
MCV: 75.8 fL — ABNORMAL LOW (ref 80.0–100.0)
Metamyelocytes Relative: 2 %
Monocytes Absolute: 1.1 10*3/uL — ABNORMAL HIGH (ref 0.1–1.0)
Monocytes Relative: 5 %
Myelocytes: 1 %
Neutro Abs: 18.8 10*3/uL — ABNORMAL HIGH (ref 1.7–7.7)
Neutrophils Relative %: 88 %
Platelets: 277 10*3/uL (ref 150–400)
RBC: 4.5 MIL/uL (ref 4.22–5.81)
RDW: 17.8 % — ABNORMAL HIGH (ref 11.5–15.5)
WBC: 21.4 10*3/uL — ABNORMAL HIGH (ref 4.0–10.5)
nRBC: 0.1 % (ref 0.0–0.2)
nRBC: 1 /100 WBC — ABNORMAL HIGH

## 2020-01-16 LAB — PHOSPHORUS: Phosphorus: 6.6 mg/dL — ABNORMAL HIGH (ref 2.5–4.6)

## 2020-01-16 LAB — GLUCOSE, CAPILLARY
Glucose-Capillary: 326 mg/dL — ABNORMAL HIGH (ref 70–99)
Glucose-Capillary: 195 mg/dL — ABNORMAL HIGH (ref 70–99)
Glucose-Capillary: 318 mg/dL — ABNORMAL HIGH (ref 70–99)

## 2020-01-16 LAB — COMPREHENSIVE METABOLIC PANEL
ALT: 10 U/L (ref 0–44)
AST: 9 U/L — ABNORMAL LOW (ref 15–41)
Albumin: 2.4 g/dL — ABNORMAL LOW (ref 3.5–5.0)
Alkaline Phosphatase: 98 U/L (ref 38–126)
Anion gap: 17 — ABNORMAL HIGH (ref 5–15)
BUN: 77 mg/dL — ABNORMAL HIGH (ref 8–23)
CO2: 18 mmol/L — ABNORMAL LOW (ref 22–32)
Calcium: 8.9 mg/dL (ref 8.9–10.3)
Chloride: 97 mmol/L — ABNORMAL LOW (ref 98–111)
Creatinine, Ser: 12.49 mg/dL — ABNORMAL HIGH (ref 0.61–1.24)
GFR calc Af Amer: 4 mL/min — ABNORMAL LOW (ref >60)
GFR calc non Af Amer: 4 mL/min — ABNORMAL LOW (ref >60)
Glucose, Bld: 319 mg/dL — ABNORMAL HIGH (ref 70–99)
Potassium: 3.9 mmol/L (ref 3.5–5.1)
Sodium: 132 mmol/L — ABNORMAL LOW (ref 135–145)
Total Bilirubin: 0.6 mg/dL (ref 0.3–1.2)
Total Protein: 6.6 g/dL (ref 6.5–8.1)

## 2020-01-16 LAB — MAGNESIUM: Magnesium: 1.9 mg/dL (ref 1.7–2.4)

## 2020-01-16 LAB — PROCALCITONIN: Procalcitonin: 9.22 ng/mL

## 2020-01-16 LAB — CULTURE, BLOOD (ROUTINE X 2)
Culture: NO GROWTH
Special Requests: ADEQUATE

## 2020-01-16 LAB — BRAIN NATRIURETIC PEPTIDE: B Natriuretic Peptide: 154.1 pg/mL — ABNORMAL HIGH (ref 0.0–100.0)

## 2020-01-16 LAB — D-DIMER, QUANTITATIVE: D-Dimer, Quant: 2.47 ug/mL-FEU — ABNORMAL HIGH (ref 0.00–0.50)

## 2020-01-16 LAB — C-REACTIVE PROTEIN: CRP: 8.2 mg/dL — ABNORMAL HIGH (ref <1.0)

## 2020-01-16 MED ORDER — HEPARIN SODIUM (PORCINE) 1000 UNIT/ML IJ SOLN
INTRAMUSCULAR | Status: AC
  Filled 2020-01-16: qty 5

## 2020-01-16 MED ORDER — AMOXICILLIN-POT CLAVULANATE 500-125 MG PO TABS: 1.0000 | ORAL_TABLET | ORAL | Status: DC

## 2020-01-16 MED ORDER — FERRIC CITRATE 1 GM 210 MG(FE) PO TABS
630.0000 mg | ORAL_TABLET | Freq: Two times a day (BID) | ORAL | Status: DC
  Administered 2020-01-16 – 2020-01-21 (×9): 630 mg via ORAL
  Filled 2020-01-16 (×9): qty 3

## 2020-01-16 MED ORDER — AMOXICILLIN-POT CLAVULANATE 500-125 MG PO TABS
1.0000 | ORAL_TABLET | Freq: Two times a day (BID) | ORAL | Status: DC
  Administered 2020-01-16 – 2020-01-18 (×5): 500 mg via ORAL
  Filled 2020-01-16 (×5): qty 1

## 2020-01-16 MED ORDER — TRAZODONE HCL 50 MG PO TABS
50.0000 mg | ORAL_TABLET | Freq: Every day | ORAL | Status: AC
  Administered 2020-01-17: 50 mg via ORAL
  Filled 2020-01-16: qty 1

## 2020-01-16 NOTE — Progress Notes (Signed)
Inpatient Diabetes Program Recommendations  AACE/ADA: New Consensus Statement on Inpatient Glycemic Control (2015)  Target Ranges:  Prepandial:   less than 140 mg/dL      Peak postprandial:   less than 180 mg/dL (1-2 hours)      Critically ill patients:  140 - 180 mg/dL   Lab Results  Component Value Date   GLUCAP 326 (H) 01/16/2020   HGBA1C 10.5 (H) 08/08/2018    Review of Glycemic Control Results for BLAYDE, BACIGALUPI (MRN [id number]) as of 01/16/2020 09:37  Ref. Range 01/15/2020 08:08 01/15/2020 11:47 01/15/2020 21:47 01/16/2020 08:04  Glucose-Capillary Latest Ref Range: 70 - 99 mg/dL 241 (H) 213 (H) 233 (H) 326 (H)   Diabetes history: DM 2 Outpatient Diabetes medications: Levemir 20 units qhs Current orders for Inpatient glycemic control: Novolog 0-15 units tid  Decadron 6 mg Q24 hours  Inpatient Diabetes Program Recommendations:    Patient takes Levemir at home. Glucose 326 mg/dl this am. Pt may benefit from adding basal insulin.   Dr. Candiss Norse to evaluate on rounds today.   Thanks,  Tama Headings RN, MSN, BC-ADM Inpatient Diabetes Coordinator Team Pager [phone] (8a-5p)

## 2020-01-16 NOTE — Progress Notes (Addendum)
PT Cancellation Note  Patient Details Name: [name] MRN: [id number] DOB: 03-Sep-1956   Cancelled Treatment:    Reason Eval/Treat Not Completed: Patient at procedure or test/unavailable. Pt just left the unit for HD. Nurse tech reports he should be back around 12:30 today. Will check back later if time allots. Attempted back at 15:20 however pt reports his pic line was just removed and he doesn't want to get up.  Jodelle Green, PT, DPT Acute Rehabilitation Services Office [phone]   Jodelle Green 01/16/2020, 9:24 AM

## 2020-01-16 NOTE — Progress Notes (Signed)
[name] NEPHROLOGY PROGRESS NOTE  Assessment/ Plan: Pt is a 64 y.o. yo male    Outpt HD:[facility], 5h 550/800 132kg 2/2 bath P4 AVG Hep 10K+ 2K midrunCalcitriol 3.25 ug tiw,sensipar 180 qhd,venofer 50 /wk,mirc 60 q 2, last 2/22  #COVID-19 pneumonia/acute hypoxic respiratory failure: On remdesivir, antibiotics per primary team.    # Streptococcal bacteremia: [place] site looks clean repeat culture negative so far. D/w primary team who consulted ID. Plan to remove [facility] today after HD and then he will need new perm cath for HD on Friday. On ceftriaxone.   # ESRD MWF schedule: HD today.    # Anemia of CKD: Continue ESA. Hb at goal.  Monitor hemoglobin.  Received Mircera on 2/22.  # Secondary hyperparathyroidism: phos high therefore increase auryxia dose. On Sensipar.  Monitor Ca, phos level.   # HTN/volume: Continue current antihypertensive medication and the UF during HD.  Subjective: Chart and lab results reviewed.  Patient is currently at [facility] isolated dialysis room.  Discussed with the primary team.  No new event. Objective Vital signs in last 24 hours: Vitals:   01/16/20 0918 01/16/20 0930 01/16/20 1000 01/16/20 1030  BP: (!) 118/56 (!) 115/51 110/76 (!) 92/50  Pulse: 66 63 75 76  Resp: 18 18 18    Temp: 97.6 F (36.4 C)     TempSrc: Axillary     SpO2:  95%    Weight:      Height:       Weight change:   Intake/Output Summary (Last 24 hours) at 01/16/2020 1043 Last data filed at 01/16/2020 0612 Gross per 24 hour  Intake 1280 ml  Output --  Net 1280 ml       Labs: Basic Metabolic Panel: Recent Labs  Lab 01/14/20 0238 01/15/20 0730 01/16/20 0311  NA 135 136 132*  K 4.5 4.1 3.9  CL 98 98 97*  CO2 20* 20* 18*  GLUCOSE 230* 237* 319*  BUN 61* 60* 77*  CREATININE 13.74* 11.27* 12.49*  CALCIUM 8.8* 8.4* 8.9  PHOS  --   --  6.6*   Liver Function Tests: Recent Labs  Lab 01/14/20 0238 01/15/20 0730 01/16/20 0311  AST 11* 14* 9*  ALT  11 11 10   ALKPHOS 75 102 98  BILITOT 0.7 0.5 0.6  PROT 6.8 7.4 6.6  ALBUMIN 2.3* 2.7* 2.4*   No results for input(s): LIPASE, AMYLASE in the last 168 hours. No results for input(s): AMMONIA in the last 168 hours. CBC: Recent Labs  Lab 01/12/20 0849 01/12/20 0849 01/13/20 4709 01/13/20 0638 01/14/20 0238 01/15/20 0730 01/16/20 0311  WBC 36.5*   < > 29.8*   < > 25.8* 22.1* 21.4*  NEUTROABS  --   --  27.0*   < > 22.7* 17.0* 18.8*  HGB 8.8*   < > 8.9*   < > 9.1* 10.4* 10.4*  HCT 27.6*   < > 28.3*   < > 29.2* 33.7* 34.1*  MCV 74.6*  --  74.5*  --  74.3* 76.1* 75.8*  PLT 323   < > 328   < > 297 286 277   < > = values in this interval not displayed.   Cardiac Enzymes: No results for input(s): CKTOTAL, CKMB, CKMBINDEX, TROPONINI in the last 168 hours. CBG: Recent Labs  Lab 01/14/20 2142 01/15/20 0808 01/15/20 1147 01/15/20 2147 01/16/20 0804  GLUCAP 236* 241* 213* 233* 326*    Iron Studies:  No results for input(s): IRON, TIBC, TRANSFERRIN, FERRITIN  in the last 72 hours. Studies/Results: DG Chest Port 1 View  Result Date: 01/14/2020 CLINICAL DATA:  Shortness of breath, COVID positive EXAM: PORTABLE CHEST 1 VIEW COMPARISON:  01/12/2020 FINDINGS: Left dialysis catheter remains in place, unchanged. Patchy airspace disease in the right upper lobe again noted. Overall, airspace disease throughout the right lung has improved. No confluent opacity on the left. No effusions. No acute bony abnormality. Heart is borderline in size. IMPRESSION: Improving airspace disease with mild residual patchy right upper lobe opacities. Electronically Signed   By: Rolm Baptise M.D.   On: 01/14/2020 11:33    Medications: Infusions: . cefTRIAXone (ROCEPHIN)  IV    . chlorproMAZINE (THORAZINE) IV 25 mg (01/12/20 1949)    Scheduled Medications: . allopurinol  100 mg Oral BID  . amLODipine  5 mg Oral Daily  . carvedilol  6.25 mg Oral BID WC  . Chlorhexidine Gluconate Cloth  6 each Topical Daily   . Chlorhexidine Gluconate Cloth  6 each Topical Q0600  . cinacalcet  90 mg Oral Q M,W,F  . dexamethasone (DECADRON) injection  6 mg Intravenous Q24H  . diphenhydrAMINE  25 mg Oral Q M,W,F  . ferric citrate  420 mg Oral BID WC  . furosemide  40 mg Oral Daily  . heparin      . heparin injection (subcutaneous)  7,500 Units Subcutaneous Q8H  . insulin aspart  0-15 Units Subcutaneous TID WC  . metroNIDAZOLE  500 mg Oral Q8H  . pantoprazole  40 mg Oral Daily  . pravastatin  20 mg Oral Daily    have reviewed scheduled and prn medications.  Physical Exam: Patient was not directly examined today as he is getting dialysis in Covid isolation room.  Also to minimize exposure and preserve PPE.  [name] [name] [name] 01/16/2020,10:43 AM  LOS: 5 days  Pager: [phone]

## 2020-01-16 NOTE — Procedures (Signed)
PROCEDURE SUMMARY:  Successful removal of tunneled hemodialysis catheter.  Patient tolerated well.  EBL < 5 mL  See full dictation in Imaging for details.  WENDY S BLAIR PA-C 01/16/2020 2:55 PM

## 2020-01-16 NOTE — Progress Notes (Signed)
Pharmacy note - antibiotics  Day #6 of antibiotics (cefepime, then ceftriaxone) for pneumococcal bacteremia/pneumonia and day #3 of metronidazole for diverticulitis.  [name] is on room air, 95% pulse oximetry, and has been eating 75-100% of his meals.  Asked Dr. Candiss Norse about changing to an oral agent today.  Will change ceftriaxone + metronidazole to augmentin 500mg  BID which will treat both the pneumococcus and diverticulitis.  Heide Guile, PharmD, BCPS-AQ ID Clinical Pharmacist Pager [phone]

## 2020-01-16 NOTE — Progress Notes (Addendum)
PROGRESS NOTE                                                                                                                                                                                                             Patient Demographics:    [name], is a 64 y.o. male, DOB - 1956-01-28, HWE:[id number]  Outpatient Primary MD for the patient is [name], MD    LOS - 5  Admit date - 01/11/2020    Chief Complaint  Patient presents with  . Emesis  . Shortness of Breath  . Sore Throat       Brief Narrative  [name] is a 64 y.o. male with medical history significant for  HTN, OSA, chronic Hep C, Type 2 diabetes and ESRD on HD MW/F who presents with concerns of "not feeling well."  His work-up suggested pneumonia with chest x-ray showing right upper lobe mass suspicious for malignancy.  He also had COVID-19 infection and was admitted to the hospital.   Subjective:   Patient in bed, appears comfortable, denies any headache, no fever, no chest pain or pressure, no shortness of breath , no abdominal pain. No focal weakness.   Assessment  & Plan :     1. Acute Hypoxic Resp. Failure, positive COVID-19 infection and possible mild pneumonia, also newly diagnosed right upper lobe mass suspicious for postobstructive bacterial pneumonia VS CAP with Strep pneumonia bacteremia -   I think his main issue is sepsis from right upper lobe bacterial pneumonia with strep bacteremia, he was extremely toxic and sick, procalcitonin and CRP were both elevated, he is on Decadron which I will continue, continue remdesivir, imaging suggested right-sided infiltrate versus mass.  He is responding well to  antibiotics, ID on board, plan is to complete hemodialysis on 01/16/2019 1 in the morning, in the afternoon remove HD catheter, give him 1 day line holiday and then to replace a new tunneled HD catheter on 01/18/2019.  Repeat blood cultures  drawn on 01/14/2020 remain negative.  Procalcitonin trending down.  Encouraged the patient to sit up in chair in the daytime use I-S and flutter valve for pulmonary toiletry and then prone in bed when at night.   SpO2: 95 % O2 Flow Rate (L/min): 2 L/min  Recent Labs  Lab 01/11/20 1610 01/11/20 1615 01/11/20 1756 01/12/20  1601 01/13/20 0932 01/14/20 0238 01/15/20 0730 01/15/20 1501 01/16/20 0311  CRP 34.4*  --    < > 42.4* 32.0* 20.2* 12.4*  --  8.2*  DDIMER 10.25*  --    < > 8.79* 6.32* 4.54* 2.75*  --  2.47*  FERRITIN 1,284*  --   --   --   --   --   --   --   --   BNP 103.8*  --    < > 320.2* 307.3* 476.9* 247.4*  --  154.1*  PROCALCITON  --   --    < > 28.29 28.00 25.99  --  13.67 9.22  SARSCOV2NAA  --  POSITIVE*  --   --   --   --   --   --   --    < > = values in this interval not displayed.    Hepatic Function Latest Ref Rng & Units 01/16/2020 01/15/2020 01/14/2020  Total Protein 6.5 - 8.1 g/dL 6.6 7.4 6.8  Albumin 3.5 - 5.0 g/dL 2.4(L) 2.7(L) 2.3(L)  AST 15 - 41 U/L 9(L) 14(L) 11(L)  ALT 0 - 44 U/L '10 11 11  '$ Alk Phosphatase 38 - 126 U/L 98 102 75  Total Bilirubin 0.3 - 1.2 mg/dL 0.6 0.5 0.7  Bilirubin, Direct 0.0 - 0.2 mg/dL - - -    2.  Right upper lobe mass, history of right sided nephrectomy due to renal cell cancer in the past, history of smoking .  New diagnosis.  Her on CT more suspicious of infiltrate hence complete antibiotic course and then repeat imaging in 2 to 3 weeks by PCP if question persists outpatient pulmonary follow-up.  3.  ESRD, Monday Wednesday Friday schedule.  Nephrology consulted.  4.  Anemia of chronic disease.  Hemoglobin close to 8.  Will monitor.  No need for transfusion.  5.  Hyponatremia and hypernatremia.  Management per nephrology and HD.  6.  OSA.  Supplemental oxygen at night, does not use CPAP.  7.  Dyslipidemia.  On statin.  8.  Essential hypertension.  On combination of Norvasc, Coreg and Lasix continue.  Still makes some  urine.  9.  Morbid obesity.  BMI 37.  Follow with PCP.  10.  Left foot Charcot joint, right lower extremity shallow 1 cm noninfected ulcer present on admission.  Supportive care.   11.  Possible asymptomatic diverticulitis noted on CT scan.  Add Flagyl to Rocephin and monitor.  Symptom-free.   12.  DM type II.  SSI for now.  Poor outpatient glycemic control due to hyperglycemia, A1c of 10.5.  Diabetic and insulin education will be provided.  Lab Results  Component Value Date   HGBA1C 10.5 (H) 08/08/2018   CBG (last 3)  Recent Labs    01/15/20 1147 01/15/20 2147 01/16/20 0804  GLUCAP 213* 233* 326*       Condition - Extremely Guarded  Family Communication  : Wife updated 01/13/2020, 01/14/20  Code Status : Full code  Diet :    Diet Order            Diet renal with fluid restriction Fluid restriction: 1200 mL Fluid; Room service appropriate? Yes; Fluid consistency: Thin  Diet effective now               Disposition Plan  : Stay in the hospital, is septic with bacterial infection likely postobstructive pneumonia.  Needs HD catheter swap,  plan is to complete hemodialysis on 01/16/2019 1 in the  morning, in the afternoon remove HD catheter, give him 1 day line holiday and then to replace a new tunneled HD catheter on 01/18/2019 thereafter if he remains stable and no signs of continued bacteremia will be discharged 1 to 2 days later preferably to home.  Consults  : Pulmonary critical care , ID, Renal  Procedures  :    CT chest, abdomen and pelvis noncontrast   -   1. Right greater than left posterior upper lobe airspace and ground-glass opacity. Most consistent with pneumonia. This is not atypical appearance of COVID-19 pneumonia, which cannot be excluded. Other infectious etiologies should also be considered. 2. Aortic atherosclerosis (ICD10-I70.0), coronary artery atherosclerosis and emphysema (ICD10-J43.9). 3. Mild thoracic adenopathy, likely reactive. 4. Pulmonary artery  enlargement suggests pulmonary arterial hypertension.   CT ABDOMEN AND PELVIS IMPRESSION 1. Sigmoid wall thickening and pericolonic edema, likely related to non complicated diverticulitis. Separate edema posterior to the ascending colon is nonspecific and could represent an area of colitis or mild diverticulitis. 2. Hepatomegaly. Hepatic morphology, highly suspicious for cirrhosis.   PUD Prophylaxis : PPI  DVT Prophylaxis  :   Heparin   Lab Results  Component Value Date   PLT 277 01/16/2020    Inpatient Medications  Scheduled Meds: . allopurinol  100 mg Oral BID  . amLODipine  5 mg Oral Daily  . carvedilol  6.25 mg Oral BID WC  . Chlorhexidine Gluconate Cloth  6 each Topical Daily  . Chlorhexidine Gluconate Cloth  6 each Topical Q0600  . cinacalcet  90 mg Oral Q M,W,F  . dexamethasone (DECADRON) injection  6 mg Intravenous Q24H  . diphenhydrAMINE  25 mg Oral Q M,W,F  . ferric citrate  630 mg Oral BID WC  . furosemide  40 mg Oral Daily  . heparin      . heparin injection (subcutaneous)  7,500 Units Subcutaneous Q8H  . insulin aspart  0-15 Units Subcutaneous TID WC  . metroNIDAZOLE  500 mg Oral Q8H  . pantoprazole  40 mg Oral Daily  . pravastatin  20 mg Oral Daily   Continuous Infusions: . cefTRIAXone (ROCEPHIN)  IV    . chlorproMAZINE (THORAZINE) IV 25 mg (01/12/20 1949)   PRN Meds:.acetaminophen, chlorproMAZINE (THORAZINE) IV, heparin, mirtazapine, ondansetron  Antibiotics  :    Anti-infectives (From admission, onward)   Start     Dose/Rate Route Frequency Ordered Stop   01/16/20 0800  cefTRIAXone (ROCEPHIN) 2 g in sodium chloride 0.9 % 100 mL IVPB     2 g 200 mL/hr over 30 Minutes Intravenous Every 24 hours 01/15/20 1451     01/15/20 1500  cefTRIAXone (ROCEPHIN) 1 g in sodium chloride 0.9 % 100 mL IVPB     1 g 200 mL/hr over 30 Minutes Intravenous  Once 01/15/20 1451 01/15/20 1635   01/14/20 1400  metroNIDAZOLE (FLAGYL) tablet 500 mg    Note to Pharmacy: Pharmacy  can adjust for ESRD and diverticulitis   500 mg Oral Every 8 hours 01/14/20 1126     01/14/20 0800  cefTRIAXone (ROCEPHIN) 1 g in sodium chloride 0.9 % 100 mL IVPB  Status:  Discontinued    Note to Pharmacy: Pharmacy can adjust for ESRD, strep pneumonaue pneumonia   1 g 200 mL/hr over 30 Minutes Intravenous Every 24 hours 01/14/20 0751 01/15/20 1451   01/13/20 1615  vancomycin (VANCOCIN) IVPB 1000 mg/200 mL premix     1,000 mg 200 mL/hr over 60 Minutes Intravenous  Once 01/13/20 1606 01/13/20 1806  01/12/20 1800  ceFEPIme (MAXIPIME) 1 g in sodium chloride 0.9 % 100 mL IVPB  Status:  Discontinued     1 g 200 mL/hr over 30 Minutes Intravenous Every 24 hours 01/12/20 1348 01/14/20 0750   01/12/20 1409  vancomycin variable dose per unstable renal function (pharmacist dosing)  Status:  Discontinued      Does not apply See admin instructions 01/12/20 1409 01/14/20 0750   01/12/20 1000  remdesivir 100 mg in sodium chloride 0.9 % 100 mL IVPB     100 mg 200 mL/hr over 30 Minutes Intravenous Daily 01/11/20 1943 01/15/20 1159   01/12/20 0100  cefTRIAXone (ROCEPHIN) 1 g in sodium chloride 0.9 % 100 mL IVPB  Status:  Discontinued     1 g 200 mL/hr over 30 Minutes Intravenous Daily at bedtime 01/11/20 1903 01/12/20 1204   01/11/20 2030  remdesivir 100 mg in sodium chloride 0.9 % 100 mL IVPB  Status:  Discontinued     100 mg 200 mL/hr over 30 Minutes Intravenous Every 1 hr x 2 01/11/20 1942 01/11/20 1944   01/11/20 2000  azithromycin (ZITHROMAX) 500 mg in sodium chloride 0.9 % 250 mL IVPB  Status:  Discontinued     500 mg 250 mL/hr over 60 Minutes Intravenous Every 24 hours 01/11/20 1903 01/12/20 1423   01/11/20 2000  remdesivir 200 mg in sodium chloride 0.9% 250 mL IVPB     200 mg 580 mL/hr over 30 Minutes Intravenous Once 01/11/20 1945 01/11/20 2130   01/11/20 1730  vancomycin (VANCOCIN) 2,500 mg in sodium chloride 0.9 % 500 mL IVPB     2,500 mg 250 mL/hr over 120 Minutes Intravenous  Once  01/11/20 1657 01/11/20 2005   01/11/20 1715  ceFEPIme (MAXIPIME) 1 g in sodium chloride 0.9 % 100 mL IVPB     1 g 200 mL/hr over 30 Minutes Intravenous  Once 01/11/20 1652 01/11/20 1806       Time Spent in minutes  30   Lala Lund M.D on 01/16/2020 at 12:29 PM  To page go to [email or web address] - password [facility]  [facility] -  Office  [phone]  See all Orders from today for further details    Objective:   Vitals:   01/16/20 1030 01/16/20 1100 01/16/20 1130 01/16/20 1200  BP: (!) 92/50 98/78 (!) 111/54 100/60  Pulse: 76 60 74 70  Resp: '20 20 18 18  '$ Temp:      TempSrc:      SpO2:      Weight:      Height:        Wt Readings from Last 3 Encounters:  01/16/20 129 kg  01/11/20 128 kg  12/25/19 134.7 kg     Intake/Output Summary (Last 24 hours) at 01/16/2020 1229 Last data filed at 01/16/2020 0612 Gross per 24 hour  Intake 1280 ml  Output --  Net 1280 ml     Physical Exam  Awake Alert, No new F.N deficits, Normal affect [place].AT,PERRAL Supple Neck,No JVD, No cervical lymphadenopathy appriciated.  Symmetrical Chest wall movement, Good air movement bilaterally, CTAB RRR,No Gallops, Rubs or new Murmurs, No Parasternal Heave +ve B.Sounds, Abd Soft, No tenderness, No organomegaly appriciated, No rebound - guarding or rigidity. L foot under UNNA boot for charcot's joint, small shallow noninfected right lower extremity lateral 1 cm in diameter ulcer, right subclavian HD catheter site appears clean.      Data Review:    CBC Recent Labs  Lab 01/11/20 1610 01/11/20 1610  01/12/20 0849 01/13/20 6945 01/14/20 0238 01/15/20 0730 01/16/20 0311  WBC 34.5*   < > 36.5* 29.8* 25.8* 22.1* 21.4*  HGB 9.0*   < > 8.8* 8.9* 9.1* 10.4* 10.4*  HCT 28.7*   < > 27.6* 28.3* 29.2* 33.7* 34.1*  PLT 303   < > 323 328 297 286 277  MCV 75.3*   < > 74.6* 74.5* 74.3* 76.1* 75.8*  MCH 23.6*   < > 23.8* 23.4* 23.2* 23.5* 23.1*  MCHC 31.4   < > 31.9 31.4 31.2 30.9 30.5  RDW 17.9*    < > 17.4* 17.4* 17.4* 18.1* 17.8*  LYMPHSABS 1.0  --   --  0.6* 0.7 1.3 0.9  MONOABS 1.5*  --   --  1.0 0.9 1.3* 1.1*  EOSABS 0.0  --   --  0.0 0.0 0.0 0.0  BASOSABS 0.1  --   --  0.1 0.1 0.1 0.0   < > = values in this interval not displayed.    Chemistries  Recent Labs  Lab 01/11/20 1610 01/11/20 1756 01/12/20 0849 01/13/20 0388 01/14/20 0238 01/15/20 0730 01/16/20 0311  NA   < >  --  129* 133* 135 136 132*  K   < >  --  4.8 4.1 4.5 4.1 3.9  CL   < >  --  91* 93* 98 98 97*  CO2   < >  --  19* 22 20* 20* 18*  GLUCOSE   < >  --  209* 210* 230* 237* 319*  BUN   < >  --  66* 46* 61* 60* 77*  CREATININE   < >  --  17.51* 12.16* 13.74* 11.27* 12.49*  CALCIUM   < >  --  8.6* 9.4 8.8* 8.4* 8.9  MG  --   --  1.9 2.1 2.0 2.0 1.9  AST  --  18  --  13* 11* 14* 9*  ALT  --  10  --  '10 11 11 10  '$ ALKPHOS  --  84  --  88 75 102 98  BILITOT  --  0.6  --  0.6 0.7 0.5 0.6   < > = values in this interval not displayed.   ------------------------------------------------------------------------------------------------------------------ No results for input(s): CHOL, HDL, LDLCALC, TRIG, CHOLHDL, LDLDIRECT in the last 72 hours.  Lab Results  Component Value Date   HGBA1C 10.5 (H) 08/08/2018   ------------------------------------------------------------------------------------------------------------------ No results for input(s): TSH, T4TOTAL, T3FREE, THYROIDAB in the last 72 hours.  Invalid input(s): FREET3  Cardiac Enzymes No results for input(s): CKMB, TROPONINI, MYOGLOBIN in the last 168 hours.  Invalid input(s): CK ------------------------------------------------------------------------------------------------------------------    Component Value Date/Time   BNP 154.1 (H) 01/16/2020 8280    Micro Results Recent Results (from the past 240 hour(s))  SARS CORONAVIRUS 2 (TAT 6-24 HRS) Nasopharyngeal Nasopharyngeal Swab     Status: None   Collection Time: 01/08/20 10:10 AM    Specimen: Nasopharyngeal Swab  Result Value Ref Range Status   SARS Coronavirus 2 NEGATIVE NEGATIVE Final    Comment: (NOTE) SARS-CoV-2 target nucleic acids are NOT DETECTED. The SARS-CoV-2 RNA is generally detectable in upper and lower respiratory specimens during the acute phase of infection. Negative results do not preclude SARS-CoV-2 infection, do not rule out co-infections with other pathogens, and should not be used as the sole basis for treatment or other patient management decisions. Negative results must be combined with clinical observations, patient history, and epidemiological information. The expected result is Negative. Fact  Sheet for Patients: SugarRoll.be Fact Sheet for Healthcare Providers: https://www.woods-mathews.com/ This test is not yet approved or cleared by the [place] FDA and  has been authorized for detection and/or diagnosis of SARS-CoV-2 by FDA under an Emergency Use Authorization (EUA). This EUA will remain  in effect (meaning this test can be used) for the duration of the COVID-19 declaration under Section 56 4(b)(1) of the Act, 21 U.S.C. section 360bbb-3(b)(1), unless the authorization is terminated or revoked sooner. Performed at [facility], [place] [address], [place], [place] [address]   Respiratory Panel by RT PCR (Flu A&B, Covid) - Nasopharyngeal Swab     Status: Abnormal   Collection Time: 01/11/20  4:15 PM   Specimen: Nasopharyngeal Swab  Result Value Ref Range Status   SARS Coronavirus 2 by RT PCR POSITIVE (A) NEGATIVE Final    Comment: RESULT CALLED TO, READ BACK BY AND VERIFIED WITH: Kathryne Sharper RN [id number] 01/11/20 JM (NOTE) SARS-CoV-2 target nucleic acids are DETECTED. SARS-CoV-2 RNA is generally detectable in upper respiratory specimens  during the acute phase of infection. Positive results are indicative of the presence of the identified virus, but do not rule out bacterial infection or  co-infection with other pathogens not detected by the test. Clinical correlation with patient history and other diagnostic information is necessary to determine patient infection status. The expected result is Negative. Fact Sheet for Patients:  PinkCheek.be Fact Sheet for Healthcare Providers: GravelBags.it This test is not yet approved or cleared by the [place] FDA and  has been authorized for detection and/or diagnosis of SARS-CoV-2 by FDA under an Emergency Use Authorization (EUA).  This EUA will remain in effect (meaning this test can be used) for th e duration of  the COVID-19 declaration under Section 564(b)(1) of the Act, 21 U.S.C. section 360bbb-3(b)(1), unless the authorization is terminated or revoked sooner.    Influenza A by PCR NEGATIVE NEGATIVE Final   Influenza B by PCR NEGATIVE NEGATIVE Final    Comment: (NOTE) The Xpert Xpress SARS-CoV-2/FLU/RSV assay is intended as an aid in  the diagnosis of influenza from Nasopharyngeal swab specimens and  should not be used as a sole basis for treatment. Nasal washings and  aspirates are unacceptable for Xpert Xpress SARS-CoV-2/FLU/RSV  testing. Fact Sheet for Patients: PinkCheek.be Fact Sheet for Healthcare Providers: GravelBags.it This test is not yet approved or cleared by the [place] FDA and  has been authorized for detection and/or diagnosis of SARS-CoV-2 by  FDA under an Emergency Use Authorization (EUA). This EUA will remain  in effect (meaning this test can be used) for the duration of the  Covid-19 declaration under Section 564(b)(1) of the Act, 21  U.S.C. section 360bbb-3(b)(1), unless the authorization is  terminated or revoked. Performed at [facility], [place] [address] [place], [place] [address]   Culture, blood (routine x 2)     Status: Abnormal   Collection Time:  01/11/20  4:52 PM   Specimen: BLOOD  Result Value Ref Range Status   Specimen Description   Final    BLOOD RIGHT ANTECUBITAL Performed at [place] [address] [place], [place] [address]    Special Requests   Final    BOTTLES DRAWN AEROBIC AND ANAEROBIC Blood Culture adequate volume Performed at [place] [address] [place], [place] [address]    Culture  Setup Time   Final    GRAM POSITIVE COCCI IN CHAINS AEROBIC BOTTLE ONLY CRITICAL RESULT  CALLED TO, READ BACK BY AND VERIFIED WITH: PHARMD L CHEN [id number] AT 1011 BY CM Performed at [facility], [place] [address] [place], [place] [address]    Culture STREPTOCOCCUS PNEUMONIAE (A)  Final   Report Status 01/14/2020 FINAL  Final   Organism ID, Bacteria STREPTOCOCCUS PNEUMONIAE  Final      Susceptibility   Streptococcus pneumoniae - MIC*    ERYTHROMYCIN <=0.12 SENSITIVE Sensitive     LEVOFLOXACIN 0.5 SENSITIVE Sensitive     VANCOMYCIN 0.5 SENSITIVE Sensitive     PENICILLIN (meningitis) 0.12 RESISTANT Resistant     PENO - penicillin 0.12      PENICILLIN (non-meningitis) 0.12 SENSITIVE Sensitive     PENICILLIN (oral) 0.12 INTERMEDIATE Intermediate     CEFTRIAXONE (non-meningitis) <=0.12 SENSITIVE Sensitive     CEFTRIAXONE (meningitis) <=0.12 SENSITIVE Sensitive     * STREPTOCOCCUS PNEUMONIAE  Culture, blood (routine x 2)     Status: None   Collection Time: 01/11/20  5:46 PM   Specimen: BLOOD LEFT HAND  Result Value Ref Range Status   Specimen Description   Final    BLOOD LEFT HAND Performed at [facility] [address] [place], [place] [address]    Special Requests   Final    BOTTLES DRAWN AEROBIC ONLY Blood Culture adequate volume Performed at [place] [address] [place], [place] [address]    Culture   Final    NO GROWTH 5 DAYS Performed at [facility], [place] [address] [place], [place] [address]    Report  Status 01/16/2020 FINAL  Final  MRSA PCR Screening     Status: None   Collection Time: 01/12/20  2:28 AM   Specimen: Nasal Mucosa; Nasopharyngeal  Result Value Ref Range Status   MRSA by PCR NEGATIVE NEGATIVE Final    Comment:        The GeneXpert MRSA Assay (FDA approved for NASAL specimens only), is one component of a comprehensive MRSA colonization surveillance program. It is not intended to diagnose MRSA infection nor to guide or monitor treatment for MRSA infections. Performed at [facility], [place] [address], [place], [place] [address]   Culture, blood (routine x 2)     Status: None (Preliminary result)   Collection Time: 01/14/20 12:10 PM   Specimen: BLOOD  Result Value Ref Range Status   Specimen Description BLOOD LEFT ANTECUBITAL  Final   Special Requests   Final    BOTTLES DRAWN AEROBIC ONLY Blood Culture results may not be optimal due to an inadequate volume of blood received in culture bottles   Culture   Final    NO GROWTH 2 DAYS Performed at [facility], [place] [address] [place], [place] [address]    Report Status PENDING  Incomplete  Culture, blood (routine x 2)     Status: None (Preliminary result)   Collection Time: 01/14/20 12:13 PM   Specimen: BLOOD LEFT HAND  Result Value Ref Range Status   Specimen Description BLOOD LEFT HAND  Final   Special Requests   Final    BOTTLES DRAWN AEROBIC ONLY Blood Culture results may not be optimal due to an inadequate volume of blood received in culture bottles   Culture   Final    NO GROWTH 2 DAYS Performed at [facility], [place] [address] [place], [place] [address]    Report Status PENDING  Incomplete    Radiology Reports CT ABDOMEN PELVIS  WO CONTRAST  Result Date: 01/13/2020 CLINICAL DATA:  Follow-up of COVID pneumonia. Lung nodule on chest radiograph. Diffuse abdominal pain. EXAM: CT CHEST, ABDOMEN AND PELVIS WITHOUT CONTRAST TECHNIQUE: Multidetector CT imaging of the chest, abdomen and pelvis was  performed following the standard protocol without IV contrast. COMPARISON:  Chest radiograph 01/12/2020. CT of the abdomen and pelvis of 12/05/2018. CTA of the chest of 03/04/2017. FINDINGS: CT CHEST FINDINGS Cardiovascular: A left sided central line terminates in the right atrium. Aortic atherosclerosis. Tortuous thoracic aorta. Moderate cardiomegaly, without pericardial effusion. Multivessel coronary artery atherosclerosis. Pulmonary artery enlargement, outflow tract 3.6 cm. 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 Mediastinum/Nodes: Right paratracheal node of 1.1 cm Hilar regions poorly evaluated without intravenous contrast. Lungs/Pleura: No pleural fluid.  Mild centrilobular emphysema. Right greater than left peripheral upper lobe airspace disease with mild surrounding ground-glass. Musculoskeletal: No acute osseous abnormality. CT ABDOMEN PELVIS FINDINGS Hepatobiliary: Hepatomegaly at 19.2 cm craniocaudal. Hepatic morphology, including caudate lobe enlargement and irregular hepatic capsule, highly suspicious for mild cirrhosis. Normal gallbladder, without biliary ductal dilatation. Pancreas: Normal, without mass or ductal dilatation. Spleen: Normal in size, without focal abnormality. Adrenals/Urinary Tract: Mild bilateral adrenal thickening. Native renal atrophy on the left. Right nephrectomy. Multiple low-density left renal lesions are likely cysts. No bladder calculi. Stomach/Bowel: Normal stomach, without wall thickening. Scattered colonic diverticula. Wall thickening within the sigmoid with mild surrounding edema, including on 95/3. No pericolonic fluid collection or free perforation. Separate area of pericolonic edema posterior to the ascending colon including on 84/3. Vascular/Lymphatic: Aortic and branch vessel atherosclerosis. Reproductive: No abdominopelvic adenopathy.  Normal prostate. Other: Fat containing left inguinal hernia. No significant free fluid. Tiny  periumbilical fat containing ventral abdominal wall laxity. Musculoskeletal: Renal osteodystrophy.  Lumbosacral spondylosis. IMPRESSION: CT CHEST IMPRESSION 1. Right greater than left posterior upper lobe airspace and ground-glass opacity. Most consistent with pneumonia. This is not atypical appearance of COVID-19 pneumonia, which cannot be excluded. Other infectious etiologies should also be considered. 2. Aortic atherosclerosis (ICD10-I70.0), coronary artery atherosclerosis and emphysema (ICD10-J43.9). 3. Mild thoracic adenopathy, likely reactive. 4. Pulmonary artery enlargement suggests pulmonary arterial hypertension. CT ABDOMEN AND PELVIS IMPRESSION 1. Sigmoid wall thickening and pericolonic edema, likely related to non complicated diverticulitis. Separate edema posterior to the ascending colon is nonspecific and could represent an area of colitis or mild diverticulitis. 2. Hepatomegaly. Hepatic morphology, highly suspicious for cirrhosis. Electronically Signed   By: Abigail Miyamoto M.D.   On: 01/13/2020 12:36   CT CHEST WO CONTRAST  Result Date: 01/13/2020 CLINICAL DATA:  Follow-up of COVID pneumonia. Lung nodule on chest radiograph. Diffuse abdominal pain. EXAM: CT CHEST, ABDOMEN AND PELVIS WITHOUT CONTRAST TECHNIQUE: Multidetector CT imaging of the chest, abdomen and pelvis was performed following the standard protocol without IV contrast. COMPARISON:  Chest radiograph 01/12/2020. CT of the abdomen and pelvis of 12/05/2018. CTA of the chest of 03/04/2017. FINDINGS: CT CHEST FINDINGS Cardiovascular: A left sided central line terminates in the right atrium. Aortic atherosclerosis. Tortuous thoracic aorta. Moderate cardiomegaly, without pericardial effusion. Multivessel coronary artery atherosclerosis. Pulmonary artery enlargement, outflow tract 3.6 cm. 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 Mediastinum/Nodes: Right paratracheal node of 1.1 cm Hilar regions poorly evaluated  without intravenous contrast. Lungs/Pleura: No pleural fluid.  Mild centrilobular emphysema. Right greater than left peripheral upper lobe airspace disease with mild surrounding ground-glass. Musculoskeletal: No acute osseous abnormality. CT ABDOMEN PELVIS FINDINGS Hepatobiliary: Hepatomegaly at 19.2 cm craniocaudal. Hepatic morphology, including caudate lobe enlargement and irregular hepatic  capsule, highly suspicious for mild cirrhosis. Normal gallbladder, without biliary ductal dilatation. Pancreas: Normal, without mass or ductal dilatation. Spleen: Normal in size, without focal abnormality. Adrenals/Urinary Tract: Mild bilateral adrenal thickening. Native renal atrophy on the left. Right nephrectomy. Multiple low-density left renal lesions are likely cysts. No bladder calculi. Stomach/Bowel: Normal stomach, without wall thickening. Scattered colonic diverticula. Wall thickening within the sigmoid with mild surrounding edema, including on 95/3. No pericolonic fluid collection or free perforation. Separate area of pericolonic edema posterior to the ascending colon including on 84/3. Vascular/Lymphatic: Aortic and branch vessel atherosclerosis. Reproductive: No abdominopelvic adenopathy.  Normal prostate. Other: Fat containing left inguinal hernia. No significant free fluid. Tiny periumbilical fat containing ventral abdominal wall laxity. Musculoskeletal: Renal osteodystrophy.  Lumbosacral spondylosis. IMPRESSION: CT CHEST IMPRESSION 1. Right greater than left posterior upper lobe airspace and ground-glass opacity. Most consistent with pneumonia. This is not atypical appearance of COVID-19 pneumonia, which cannot be excluded. Other infectious etiologies should also be considered. 2. Aortic atherosclerosis (ICD10-I70.0), coronary artery atherosclerosis and emphysema (ICD10-J43.9). 3. Mild thoracic adenopathy, likely reactive. 4. Pulmonary artery enlargement suggests pulmonary arterial hypertension. CT ABDOMEN AND  PELVIS IMPRESSION 1. Sigmoid wall thickening and pericolonic edema, likely related to non complicated diverticulitis. Separate edema posterior to the ascending colon is nonspecific and could represent an area of colitis or mild diverticulitis. 2. Hepatomegaly. Hepatic morphology, highly suspicious for cirrhosis. Electronically Signed   By: Abigail Miyamoto M.D.   On: 01/13/2020 12:36   DG Chest Port 1 View  Result Date: 01/14/2020 CLINICAL DATA:  Shortness of breath, COVID positive EXAM: PORTABLE CHEST 1 VIEW COMPARISON:  01/12/2020 FINDINGS: Left dialysis catheter remains in place, unchanged. Patchy airspace disease in the right upper lobe again noted. Overall, airspace disease throughout the right lung has improved. No confluent opacity on the left. No effusions. No acute bony abnormality. Heart is borderline in size. IMPRESSION: Improving airspace disease with mild residual patchy right upper lobe opacities. Electronically Signed   By: Rolm Baptise M.D.   On: 01/14/2020 11:33   DG Chest Port 1 View  Result Date: 01/12/2020 CLINICAL DATA:  Short of breath EXAM: PORTABLE CHEST 1 VIEW COMPARISON:  01/11/2020 FINDINGS: Right upper lobe airspace disease unchanged. Improved aeration in the lung bases with improved lung volume. Negative for edema or effusion. Central venous catheter tip in the right atrium unchanged. IMPRESSION: Persistent right upper lobe airspace disease unchanged Improved aeration lungs with decrease in bibasilar atelectasis. Electronically Signed   By: Franchot Gallo M.D.   On: 01/12/2020 09:16   DG Chest Port 1 View  Result Date: 01/11/2020 CLINICAL DATA:  Shortness of breath EXAM: PORTABLE CHEST 1 VIEW COMPARISON:  01/10/2019 FINDINGS: There is bilateral diffuse interstitial thickening. There is a nodular appearance along the right suprahilar region likely reflecting prominent pulmonary vasculature or airspace disease. There is no pleural effusion or pneumothorax. The heart mediastinum are  stable. There is a large bore left-sided central venous catheter with the tip projecting over the cavoatrial junction. There is no acute osseous abnormality. IMPRESSION: Diffuse bilateral interstitial thickening likely reflecting interstitial edema. Nodular appearance along the right suprahilar region likely reflecting prominent pulmonary vasculature or airspace disease. Electronically Signed   By: Kathreen Devoid   On: 01/11/2020 16:31   DG Chest Port 1 View  Result Date: 01/11/2020 CLINICAL DATA:  Cough, short of breath EXAM: PORTABLE CHEST 1 VIEW COMPARISON:  03/07/2017 FINDINGS: Single frontal view of the chest demonstrates a left internal jugular dialysis catheter tip overlying  atrial caval junction. Vascular stent overlies the right axilla. Cardiac silhouette is stable. There is a 7.3 cm mass projecting over the right anterior first and second ribs. CT chest with IV contrast recommended for suspected neoplasm. There is central vascular congestion with diffuse interstitial prominence consistent with mild fluid overload. Right hilar prominence could also reflect lymphadenopathy. No effusion or pneumothorax. No acute bony abnormality. IMPRESSION: 1. 7.3 cm right upper lobe mass consistent with neoplasm. CT chest with IV contrast is recommended. 2. Right hilar prominence could reflect underlying adenopathy. 3. Vascular congestion and interstitial edema. Electronically Signed   By: Randa Ngo M.D.   On: 01/11/2020 04:02   VAS [place] UPPER EXTREMITY ARTERIAL DUPLEX  Result Date: 12/25/2019 UPPER EXTREMITY DUPLEX STUDY Indications: Pre-operative exam.  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 Bilateral testing is  considered an integral part of a complete examination. Limited examinations for reoccurring indications may be performed as noted.  Right Pre-Dialysis Findings: +-----------------------+----------+--------------------+---------+--------+ Location               PSV (cm/s)Intralum. Diam. (cm)Waveform Comments +-----------------------+----------+--------------------+---------+--------+ Brachial Antecub. fossa40        0.46                triphasic         +-----------------------+----------+--------------------+---------+--------+ Radial Art at Wrist    35        0.29                triphasic         +-----------------------+----------+--------------------+---------+--------+ Ulnar Art at Wrist     81        0.27                triphasic         +-----------------------+----------+--------------------+---------+--------+ +------------+-----------------+ Allen's TestBranches at fossa +------------+-----------------+ Left Pre-Dialysis Findings: +-----------------------+----------+--------------------+---------+--------+ Location               PSV (cm/s)Intralum. Diam. (cm)Waveform Comments +-----------------------+----------+--------------------+---------+--------+ Brachial Antecub. fossa54        0.52                triphasic         +-----------------------+----------+--------------------+---------+--------+ Radial Art at Wrist    44        0.27                triphasictortuous +-----------------------+----------+--------------------+---------+--------+ Ulnar Art at Wrist     73        0.19                triphasic         +-----------------------+----------+--------------------+---------+--------+ +------------+-----------------+ Allen's TestBranches at fossa +------------+-----------------+  Summary:   Measurements above. *See table(s) above for measurements and observations. Electronically signed by Curt Jews MD on 12/25/2019 at 9:30:15 AM.    Final    VAS [place]  UPPER EXTREMITY VEIN MAPPING  Result Date: 12/25/2019 UPPER EXTREMITY VEIN MAPPING  Indications: Pre-access. History: History of right Radiocephalic fistula years ago.  Performing Technologist: Alvia Grove RVT  Examination Guidelines: A complete evaluation includes B-mode imaging, spectral Doppler, color Doppler, and power Doppler as needed of all accessible portions of each vessel. Bilateral testing is considered an integral part  of a complete examination. Limited examinations for reoccurring indications may be performed as noted. +-----------------+-------------+----------+--------+ Right Cephalic   Diameter (cm)Depth (cm)Findings +-----------------+-------------+----------+--------+ Prox upper arm                             [place]    +-----------------+-------------+----------+--------+ Mid upper arm                              [place]    +-----------------+-------------+----------+--------+ Dist upper arm                             [place]    +-----------------+-------------+----------+--------+ Antecubital fossa                          [place]    +-----------------+-------------+----------+--------+ +-----------------+-------------+----------+---------+ Right Basilic    Diameter (cm)Depth (cm)Findings  +-----------------+-------------+----------+---------+ Prox upper arm       0.46                         +-----------------+-------------+----------+---------+ Mid upper arm        0.68               branching +-----------------+-------------+----------+---------+ Dist upper arm       0.34                         +-----------------+-------------+----------+---------+ Antecubital fossa    0.31                         +-----------------+-------------+----------+---------+ Prox forearm         0.27                         +-----------------+-------------+----------+---------+ +-----------------+-------------+----------+--------+ Left Cephalic    Diameter (cm)Depth  (cm)Findings +-----------------+-------------+----------+--------+ Shoulder             0.47                        +-----------------+-------------+----------+--------+ Prox upper arm       0.36                        +-----------------+-------------+----------+--------+ Mid upper arm        0.41                        +-----------------+-------------+----------+--------+ Dist upper arm       0.43                        +-----------------+-------------+----------+--------+ Antecubital fossa    0.54                        +-----------------+-------------+----------+--------+ Prox forearm         0.43                        +-----------------+-------------+----------+--------+ Mid forearm          0.38                        +-----------------+-------------+----------+--------+ Dist forearm  0.33                        +-----------------+-------------+----------+--------+ +-----------------+-------------+----------+--------+ Left Basilic     Diameter (cm)Depth (cm)Findings +-----------------+-------------+----------+--------+ Prox upper arm       0.48                        +-----------------+-------------+----------+--------+ Mid upper arm        0.51                        +-----------------+-------------+----------+--------+ Dist upper arm       0.47                        +-----------------+-------------+----------+--------+ Antecubital fossa    0.26                        +-----------------+-------------+----------+--------+ Prox forearm         0.25                        +-----------------+-------------+----------+--------+ Summary: Right: Occluded right forearm fistula. Thrombus in the cephalic vein        from the antecubital fossa extending proximally into the        subclavian vein, appearing chronic. Left: Measurements above. *See table(s) above for measurements and observations.  Diagnosing physician: Curt Jews MD  Electronically signed by Curt Jews MD on 12/25/2019 at 9:27:41 AM.    Final

## 2020-01-17 LAB — CBC WITH DIFFERENTIAL/PLATELET
Abs Immature Granulocytes: 3.15 10*3/uL — ABNORMAL HIGH (ref 0.00–0.07)
Basophils Absolute: 0 10*3/uL (ref 0.0–0.1)
Basophils Relative: 0 %
Eosinophils Absolute: 0.1 10*3/uL (ref 0.0–0.5)
Eosinophils Relative: 0 %
HCT: 34.9 % — ABNORMAL LOW (ref 39.0–52.0)
Hemoglobin: 10.6 g/dL — ABNORMAL LOW (ref 13.0–17.0)
Immature Granulocytes: 13 %
Lymphocytes Relative: 9 %
Lymphs Abs: 2.3 10*3/uL (ref 0.7–4.0)
MCH: 22.9 pg — ABNORMAL LOW (ref 26.0–34.0)
MCHC: 30.4 g/dL (ref 30.0–36.0)
MCV: 75.5 fL — ABNORMAL LOW (ref 80.0–100.0)
Monocytes Absolute: 1.8 10*3/uL — ABNORMAL HIGH (ref 0.1–1.0)
Monocytes Relative: 8 %
Neutro Abs: 16.6 10*3/uL — ABNORMAL HIGH (ref 1.7–7.7)
Neutrophils Relative %: 70 %
Platelets: 257 10*3/uL (ref 150–400)
RBC: 4.62 MIL/uL (ref 4.22–5.81)
RDW: 18.1 % — ABNORMAL HIGH (ref 11.5–15.5)
WBC: 23.9 10*3/uL — ABNORMAL HIGH (ref 4.0–10.5)
nRBC: 0.4 % — ABNORMAL HIGH (ref 0.0–0.2)

## 2020-01-17 LAB — COMPREHENSIVE METABOLIC PANEL
ALT: 13 U/L (ref 0–44)
AST: 13 U/L — ABNORMAL LOW (ref 15–41)
Albumin: 2.6 g/dL — ABNORMAL LOW (ref 3.5–5.0)
Alkaline Phosphatase: 98 U/L (ref 38–126)
Anion gap: 19 — ABNORMAL HIGH (ref 5–15)
BUN: 59 mg/dL — ABNORMAL HIGH (ref 8–23)
CO2: 17 mmol/L — ABNORMAL LOW (ref 22–32)
Calcium: 8.4 mg/dL — ABNORMAL LOW (ref 8.9–10.3)
Chloride: 99 mmol/L (ref 98–111)
Creatinine, Ser: 10.94 mg/dL — ABNORMAL HIGH (ref 0.61–1.24)
GFR calc Af Amer: 5 mL/min — ABNORMAL LOW (ref >60)
GFR calc non Af Amer: 4 mL/min — ABNORMAL LOW (ref >60)
Glucose, Bld: 262 mg/dL — ABNORMAL HIGH (ref 70–99)
Potassium: 3.4 mmol/L — ABNORMAL LOW (ref 3.5–5.1)
Sodium: 135 mmol/L (ref 135–145)
Total Bilirubin: 0.5 mg/dL (ref 0.3–1.2)
Total Protein: 6.7 g/dL (ref 6.5–8.1)

## 2020-01-17 LAB — PROCALCITONIN: Procalcitonin: 6.57 ng/mL

## 2020-01-17 LAB — GLUCOSE, CAPILLARY
Glucose-Capillary: 212 mg/dL — ABNORMAL HIGH (ref 70–99)
Glucose-Capillary: 207 mg/dL — ABNORMAL HIGH (ref 70–99)
Glucose-Capillary: 157 mg/dL — ABNORMAL HIGH (ref 70–99)
Glucose-Capillary: 129 mg/dL — ABNORMAL HIGH (ref 70–99)

## 2020-01-17 LAB — PHOSPHORUS: Phosphorus: 5 mg/dL — ABNORMAL HIGH (ref 2.5–4.6)

## 2020-01-17 LAB — C-REACTIVE PROTEIN: CRP: 5.5 mg/dL — ABNORMAL HIGH (ref <1.0)

## 2020-01-17 LAB — BRAIN NATRIURETIC PEPTIDE: B Natriuretic Peptide: 71.6 pg/mL (ref 0.0–100.0)

## 2020-01-17 LAB — MAGNESIUM: Magnesium: 2 mg/dL (ref 1.7–2.4)

## 2020-01-17 LAB — D-DIMER, QUANTITATIVE: D-Dimer, Quant: 2.51 ug/mL-FEU — ABNORMAL HIGH (ref 0.00–0.50)

## 2020-01-17 MED ORDER — DEXAMETHASONE SODIUM PHOSPHATE 4 MG/ML IJ SOLN
2.0000 mg | INTRAMUSCULAR | Status: AC
  Administered 2020-01-17: 2 mg via INTRAVENOUS
  Filled 2020-01-17: qty 1

## 2020-01-17 MED ORDER — CHLORHEXIDINE GLUCONATE CLOTH 2 % EX PADS
6.0000 | MEDICATED_PAD | Freq: Every day | CUTANEOUS | Status: DC
  Administered 2020-01-18 – 2020-01-20 (×3): 6 via TOPICAL

## 2020-01-17 NOTE — Progress Notes (Signed)
[facility] NEPHROLOGY PROGRESS NOTE  Assessment/ Plan: Pt is a 64 y.o. yo male    Outpt HD:[facility], 5h 550/800 132kg 2/2 bath P4 AVG Hep 10K+ 2K midrunCalcitriol 3.25 ug tiw,sensipar 180 qhd,venofer 50 /wk,mirc 60 q 2, last 2/22  #COVID-19 pneumonia/acute hypoxic respiratory failure: On remdesivir, antibiotics per primary team.    # Streptococcal bacteremia: The HD tunnel catheter was removed on 3/3.  The repeat blood cultures remains negative.  Order placement of tunneled catheter by IR for tomorrow. On augmentin.   # ESRD MWF schedule: HD tomorrow after placement of new tunneled catheter.  I have discussed with the patient and he agreed.  # Anemia of CKD: Continue ESA. Hb at goal.  Monitor hemoglobin.  Received Mircera on 2/22.  # Secondary hyperparathyroidism: phos high therefore increased auryxia dose. On Sensipar.  Monitor Ca, phos level.   # HTN/volume: Continue current antihypertensive medication and the UF during HD.  Subjective: Chart and lab results reviewed.  Dialysis catheter removed.  Denies nausea vomiting chest pain shortness of breath.  No new event. Objective Vital signs in last 24 hours: Vitals:   01/16/20 2127 01/17/20 0507 01/17/20 0904 01/17/20 1027  BP: 102/64 93/64 (!) 88/74 95/73  Pulse: 72 74  79  Resp: 18 20    Temp: 98 F (36.7 C) 97.7 F (36.5 C)    TempSrc: Oral Oral    SpO2: 97% 97%  93%  Weight:  132 kg    Height:       Weight change:   Intake/Output Summary (Last 24 hours) at 01/17/2020 1037 Last data filed at 01/16/2020 1728 Gross per 24 hour  Intake 970 ml  Output 2000 ml  Net -1030 ml       Labs: Basic Metabolic Panel: Recent Labs  Lab 01/15/20 0730 01/16/20 0311 01/17/20 0310  NA 136 132* 135  K 4.1 3.9 3.4*  CL 98 97* 99  CO2 20* 18* 17*  GLUCOSE 237* 319* 262*  BUN 60* 77* 59*  CREATININE 11.27* 12.49* 10.94*  CALCIUM 8.4* 8.9 8.4*  PHOS  --  6.6*  --    Liver Function Tests: Recent  Labs  Lab 01/15/20 0730 01/16/20 0311 01/17/20 0310  AST 14* 9* 13*  ALT 11 10 13   ALKPHOS 102 98 98  BILITOT 0.5 0.6 0.5  PROT 7.4 6.6 6.7  ALBUMIN 2.7* 2.4* 2.6*   No results for input(s): LIPASE, AMYLASE in the last 168 hours. No results for input(s): AMMONIA in the last 168 hours. CBC: Recent Labs  Lab 01/13/20 0638 01/13/20 8841 01/14/20 0238 01/14/20 0238 01/15/20 0730 01/16/20 0311 01/17/20 0310  WBC 29.8*   < > 25.8*   < > 22.1* 21.4* 23.9*  NEUTROABS 27.0*   < > 22.7*   < > 17.0* 18.8* 16.6*  HGB 8.9*   < > 9.1*   < > 10.4* 10.4* 10.6*  HCT 28.3*   < > 29.2*   < > 33.7* 34.1* 34.9*  MCV 74.5*  --  74.3*  --  76.1* 75.8* 75.5*  PLT 328   < > 297   < > 286 277 257   < > = values in this interval not displayed.   Cardiac Enzymes: No results for input(s): CKTOTAL, CKMB, CKMBINDEX, TROPONINI in the last 168 hours. CBG: Recent Labs  Lab 01/15/20 2147 01/16/20 0804 01/16/20 1659 01/16/20 2127 01/17/20 0815  GLUCAP 233* 326* 195* 318* 212*    Iron Studies:  No results for  input(s): IRON, TIBC, TRANSFERRIN, FERRITIN in the last 72 hours. Studies/Results: IR Removal Tun Cv Cath W/O FL  Result Date: 01/16/2020 INDICATION: Streptococcus pneumoniae bacteremia. Request for removal of tunneled hemodialysis catheter. Initially placed at an outside facility. EXAM: REMOVAL OF TUNNELED HEMODIALYSIS CATHETER MEDICATIONS: 1% lidocaine with epinephrine 4 mL COMPLICATIONS: None immediate. PROCEDURE: Informed written consent was obtained from the patient following an explanation of the procedure, risks, benefits and alternatives to treatment. A time out was performed prior to the initiation of the procedure. Maximal barrier sterile technique was utilized including caps, mask, sterile gowns, sterile gloves, large sterile drape, hand hygiene, and Betadine. 1% lidocaine with epinephrine was injected under sterile conditions along the subcutaneous tunnel. Utilizing a combination of  blunt dissection and gentle traction, the catheter was removed intact. Hemostasis was obtained with manual compression. A dressing was placed. The patient tolerated the procedure well without immediate post procedural complication. IMPRESSION: Successful removal of tunneled dialysis catheter. Read by: Gareth Eagle, PA-C Electronically Signed   By: Jacqulynn Cadet M.D.   On: 01/16/2020 14:54    Medications: Infusions: . chlorproMAZINE (THORAZINE) IV 25 mg (01/12/20 1949)    Scheduled Medications: . allopurinol  100 mg Oral BID  . amLODipine  5 mg Oral Daily  . amoxicillin-clavulanate  1 tablet Oral BID  . carvedilol  6.25 mg Oral BID WC  . Chlorhexidine Gluconate Cloth  6 each Topical Daily  . Chlorhexidine Gluconate Cloth  6 each Topical Q0600  . cinacalcet  90 mg Oral Q M,W,F  . dexamethasone (DECADRON) injection  2 mg Intravenous Q24H  . diphenhydrAMINE  25 mg Oral Q M,W,F  . ferric citrate  630 mg Oral BID WC  . furosemide  40 mg Oral Daily  . heparin injection (subcutaneous)  7,500 Units Subcutaneous Q8H  . insulin aspart  0-15 Units Subcutaneous TID WC  . pantoprazole  40 mg Oral Daily  . pravastatin  20 mg Oral Daily    have reviewed scheduled and prn medications.  Physical Exam: General:NAD, comfortable Heart:RRR, s1s2 nl Lungs: No increased work of breathing, clear b/l. Abdomen:soft, Non-tender, non-distended Extremities: No LE edema Dialysis Access: No HD access today.  Kambria Grima Prasad Ayven Pheasant 01/17/2020,10:37 AM  LOS: 6 days  Pager: [phone]

## 2020-01-17 NOTE — Progress Notes (Signed)
PROGRESS NOTE                                                                                                                                                                                                             Patient Demographics:    [name], is a 64 y.o. male, DOB - [birth year], TJL:[id number]  Outpatient Primary MD for the patient is [name], MD    LOS - 6  Admit date - 01/11/2020    Chief Complaint  Patient presents with  . Emesis  . Shortness of Breath  . Sore Throat       Brief Narrative  [name] is a 64 y.o. male with medical history significant for  HTN, OSA, chronic Hep C, Type 2 diabetes and ESRD on HD MW/F who presents with concerns of "not feeling well."  His work-up suggested pneumonia with chest x-ray showing right upper lobe mass suspicious for malignancy.  He also had COVID-19 infection and was admitted to the hospital.   Subjective:   Patient in bed, appears comfortable, denies any headache, no fever, no chest pain or pressure, no shortness of breath , no abdominal pain. No focal weakness.   Assessment  & Plan :     1. Acute Hypoxic Resp. Failure, positive COVID-19 infection and possible mild pneumonia, also newly diagnosed right upper lobe mass suspicious for postobstructive bacterial pneumonia VS CAP with Strep pneumonia bacteremia -   I think his main issue is sepsis from right upper lobe bacterial pneumonia with strep bacteremia, he was extremely toxic and sick, procalcitonin and CRP were both elevated, he is on Decadron which I will continue, continue remdesivir, imaging suggested right-sided infiltrate versus mass.  He is responding well to  antibiotics, ID on board, his old HD catheter has been removed after dialysis on 01/16/2020, we will give him 1 day line holiday and then to replace a new tunneled HD catheter on 01/18/2019.  Repeat blood cultures drawn on 01/14/2020 remain  negative.  Procalcitonin trending down.  Encouraged the patient to sit up in chair in the daytime use I-S and flutter valve for pulmonary toiletry and then prone in bed when at night.   SpO2: 97 % O2 Flow Rate (L/min): 2 L/min  Recent Labs  Lab 01/11/20 1610 01/11/20 1615 01/11/20 1756 01/13/20 0158 01/14/20 0238 01/15/20  0730 01/15/20 1501 01/16/20 0311 01/17/20 0310  CRP 34.4*  --    < > 32.0* 20.2* 12.4*  --  8.2* 5.5*  DDIMER 10.25*  --    < > 6.32* 4.54* 2.75*  --  2.47* 2.51*  FERRITIN 1,284*  --   --   --   --   --   --   --   --   BNP 103.8*  --    < > 307.3* 476.9* 247.4*  --  154.1* 71.6  PROCALCITON  --   --    < > 28.00 25.99  --  13.67 9.22 6.57  SARSCOV2NAA  --  POSITIVE*  --   --   --   --   --   --   --    < > = values in this interval not displayed.    Hepatic Function Latest Ref Rng & Units 01/17/2020 01/16/2020 01/15/2020  Total Protein 6.5 - 8.1 g/dL 6.7 6.6 7.4  Albumin 3.5 - 5.0 g/dL 2.6(L) 2.4(L) 2.7(L)  AST 15 - 41 U/L 13(L) 9(L) 14(L)  ALT 0 - 44 U/L _0 Alk Phosphatase 38 - 126 U/L 98 98 102  Total Bilirubin 0.3 - 1.2 mg/dL 0.5 0.6 0.5  Bilirubin, Direct 0.0 - 0.2 mg/dL - - -    2.  Right upper lobe mass, history of right sided nephrectomy due to renal cell cancer in the past, history of smoking .  New diagnosis.  Her on CT more suspicious of infiltrate hence complete antibiotic course and then repeat imaging in 2 to 3 weeks by PCP if question persists outpatient pulmonary follow-up.  3.  ESRD, Monday Wednesday Friday schedule. Nephrology consulted.  4.  Anemia of chronic disease.  Hemoglobin close to 8.  Will monitor.  No need for transfusion.  5.  Hyponatremia and hypernatremia.  Management per nephrology and HD.  6.  OSA.  Supplemental oxygen at night, does not use CPAP.  7.  Dyslipidemia.  On statin.  8.  Essential hypertension.  On combination of Norvasc, Coreg and Lasix continue.  Still makes some urine.  9.  Morbid obesity.  BMI 37.   Follow with PCP.  10. Left foot Charcot joint, right lower extremity shallow 1 cm noninfected ulcer present on admission.  Supportive care.   11.  Possible asymptomatic diverticulitis noted on CT scan.  Add Flagyl to Rocephin and monitor.  Symptom-free.   12.  DM type II.  SSI for now.  Poor outpatient glycemic control due to hyperglycemia, A1c of 10.5.  Diabetic and insulin education will be provided.  Lab Results  Component Value Date   HGBA1C 10.5 (H) 08/08/2018   CBG (last 3)  Recent Labs    01/16/20 1659 01/16/20 2127 01/17/20 0815  GLUCAP 195* 318* 212*       Condition - Extremely Guarded  Family Communication  : Wife updated 01/13/2020, 01/14/20, 01/17/20  Code Status : Full code  Diet :    Diet Order            Diet renal with fluid restriction Fluid restriction: 1200 mL Fluid; Room service appropriate? Yes; Fluid consistency: Thin  Diet effective now               Disposition Plan  : Stay in the hospital, is septic with bacterial infection likely postobstructive pneumonia.  Needs HD catheter swap, old HD catheter removed on 01/16/2020 we will give him 1 day line holiday and then  to replace a new tunneled HD catheter on 01/18/2019 thereafter if he remains stable and no signs of continued bacteremia will be discharged 1 to 2 days later preferably to home.  Consults  : Pulmonary critical care , ID, Renal  Procedures  :    CT chest, abdomen and pelvis noncontrast   -   1. Right greater than left posterior upper lobe airspace and ground-glass opacity. Most consistent with pneumonia. This is not atypical appearance of COVID-19 pneumonia, which cannot be excluded. Other infectious etiologies should also be considered. 2. Aortic atherosclerosis (ICD10-I70.0), coronary artery atherosclerosis and emphysema (ICD10-J43.9). 3. Mild thoracic adenopathy, likely reactive. 4. Pulmonary artery enlargement suggests pulmonary arterial hypertension.   CT ABDOMEN AND PELVIS IMPRESSION 1.  Sigmoid wall thickening and pericolonic edema, likely related to non complicated diverticulitis. Separate edema posterior to the ascending colon is nonspecific and could represent an area of colitis or mild diverticulitis. 2. Hepatomegaly. Hepatic morphology, highly suspicious for cirrhosis.   PUD Prophylaxis : PPI  DVT Prophylaxis  :   Heparin   Lab Results  Component Value Date   PLT 257 01/17/2020    Inpatient Medications  Scheduled Meds: . allopurinol  100 mg Oral BID  . amLODipine  5 mg Oral Daily  . amoxicillin-clavulanate  1 tablet Oral BID  . carvedilol  6.25 mg Oral BID WC  . Chlorhexidine Gluconate Cloth  6 each Topical Daily  . Chlorhexidine Gluconate Cloth  6 each Topical Q0600  . cinacalcet  90 mg Oral Q M,W,F  . dexamethasone (DECADRON) injection  2 mg Intravenous Q24H  . diphenhydrAMINE  25 mg Oral Q M,W,F  . ferric citrate  630 mg Oral BID WC  . furosemide  40 mg Oral Daily  . heparin injection (subcutaneous)  7,500 Units Subcutaneous Q8H  . insulin aspart  0-15 Units Subcutaneous TID WC  . pantoprazole  40 mg Oral Daily  . pravastatin  20 mg Oral Daily   Continuous Infusions: . chlorproMAZINE (THORAZINE) IV 25 mg (01/12/20 1949)   PRN Meds:.acetaminophen, chlorproMAZINE (THORAZINE) IV, heparin, mirtazapine, ondansetron  Antibiotics  :    Anti-infectives (From admission, onward)   Start     Dose/Rate Route Frequency Ordered Stop   01/16/20 1800  amoxicillin-clavulanate (AUGMENTIN) 500-125 MG per tablet 500 mg  Status:  Discontinued     1 tablet Oral Every 24 hours 01/16/20 1248 01/16/20 1259   01/16/20 1300  amoxicillin-clavulanate (AUGMENTIN) 500-125 MG per tablet 500 mg     1 tablet Oral 2 times daily 01/16/20 1259     01/16/20 0800  cefTRIAXone (ROCEPHIN) 2 g in sodium chloride 0.9 % 100 mL IVPB  Status:  Discontinued     2 g 200 mL/hr over 30 Minutes Intravenous Every 24 hours 01/15/20 1451 01/16/20 1248   01/15/20 1500  cefTRIAXone (ROCEPHIN) 1 g in  sodium chloride 0.9 % 100 mL IVPB     1 g 200 mL/hr over 30 Minutes Intravenous  Once 01/15/20 1451 01/15/20 1635   01/14/20 1400  metroNIDAZOLE (FLAGYL) tablet 500 mg  Status:  Discontinued    Note to Pharmacy: Pharmacy can adjust for ESRD and diverticulitis   500 mg Oral Every 8 hours 01/14/20 1126 01/16/20 1248   01/14/20 0800  cefTRIAXone (ROCEPHIN) 1 g in sodium chloride 0.9 % 100 mL IVPB  Status:  Discontinued    Note to Pharmacy: Pharmacy can adjust for ESRD, strep pneumonaue pneumonia   1 g 200 mL/hr over 30 Minutes Intravenous Every 24  hours 01/14/20 0751 01/15/20 1451   01/13/20 1615  vancomycin (VANCOCIN) IVPB 1000 mg/200 mL premix     1,000 mg 200 mL/hr over 60 Minutes Intravenous  Once 01/13/20 1606 01/13/20 1806   01/12/20 1800  ceFEPIme (MAXIPIME) 1 g in sodium chloride 0.9 % 100 mL IVPB  Status:  Discontinued     1 g 200 mL/hr over 30 Minutes Intravenous Every 24 hours 01/12/20 1348 01/14/20 0750   01/12/20 1409  vancomycin variable dose per unstable renal function (pharmacist dosing)  Status:  Discontinued      Does not apply See admin instructions 01/12/20 1409 01/14/20 0750   01/12/20 1000  remdesivir 100 mg in sodium chloride 0.9 % 100 mL IVPB     100 mg 200 mL/hr over 30 Minutes Intravenous Daily 01/11/20 1943 01/15/20 1159   01/12/20 0100  cefTRIAXone (ROCEPHIN) 1 g in sodium chloride 0.9 % 100 mL IVPB  Status:  Discontinued     1 g 200 mL/hr over 30 Minutes Intravenous Daily at bedtime 01/11/20 1903 01/12/20 1204   01/11/20 2030  remdesivir 100 mg in sodium chloride 0.9 % 100 mL IVPB  Status:  Discontinued     100 mg 200 mL/hr over 30 Minutes Intravenous Every 1 hr x 2 01/11/20 1942 01/11/20 1944   01/11/20 2000  azithromycin (ZITHROMAX) 500 mg in sodium chloride 0.9 % 250 mL IVPB  Status:  Discontinued     500 mg 250 mL/hr over 60 Minutes Intravenous Every 24 hours 01/11/20 1903 01/12/20 1423   01/11/20 2000  remdesivir 200 mg in sodium chloride 0.9% 250 mL IVPB      200 mg 580 mL/hr over 30 Minutes Intravenous Once 01/11/20 1945 01/11/20 2130   01/11/20 1730  vancomycin (VANCOCIN) 2,500 mg in sodium chloride 0.9 % 500 mL IVPB     2,500 mg 250 mL/hr over 120 Minutes Intravenous  Once 01/11/20 1657 01/11/20 2005   01/11/20 1715  ceFEPIme (MAXIPIME) 1 g in sodium chloride 0.9 % 100 mL IVPB     1 g 200 mL/hr over 30 Minutes Intravenous  Once 01/11/20 1652 01/11/20 1806       Time Spent in minutes  30   Lala Lund M.D on 01/17/2020 at 9:39 AM  To page go to [email or web address] - password [facility]  [facility] -  Office  [phone]  See all Orders from today for further details    Objective:   Vitals:   01/16/20 1318 01/16/20 1321 01/16/20 2127 01/17/20 0507  BP: 112/62 124/81 102/64 93/64  Pulse:  67 72 74  Resp: _0 Temp: 97.6 F (36.4 C) 97.7 F (36.5 C) 98 F (36.7 C) 97.7 F (36.5 C)  TempSrc: Axillary Oral Oral Oral  SpO2:  94% 97% 97%  Weight:    132 kg  Height:        Wt Readings from Last 3 Encounters:  01/17/20 132 kg  01/11/20 128 kg  12/25/19 134.7 kg     Intake/Output Summary (Last 24 hours) at 01/17/2020 0939 Last data filed at 01/16/2020 1728 Gross per 24 hour  Intake 970 ml  Output 2000 ml  Net -1030 ml     Physical Exam  Awake Alert, No new F.N deficits, Normal affect [place].AT,PERRAL Supple Neck,No JVD, No cervical lymphadenopathy appriciated.  Symmetrical Chest wall movement, Good air movement bilaterally, CTAB RRR,No Gallops, Rubs or new Murmurs, No Parasternal Heave +ve B.Sounds, Abd Soft, No tenderness, No organomegaly appriciated, No rebound -  guarding or rigidity. L foot under UNNA boot for charcot's joint, small shallow noninfected right lower extremity lateral 1 cm in diameter ulcer, right subclavian HD catheter removed     Data Review:    CBC Recent Labs  Lab 01/13/20 0638 01/14/20 0238 01/15/20 0730 01/16/20 0311 01/17/20 0310  WBC 29.8* 25.8* 22.1* 21.4* 23.9*  HGB 8.9*  9.1* 10.4* 10.4* 10.6*  HCT 28.3* 29.2* 33.7* 34.1* 34.9*  PLT 328 297 286 277 257  MCV 74.5* 74.3* 76.1* 75.8* 75.5*  MCH 23.4* 23.2* 23.5* 23.1* 22.9*  MCHC 31.4 31.2 30.9 30.5 30.4  RDW 17.4* 17.4* 18.1* 17.8* 18.1*  LYMPHSABS 0.6* 0.7 1.3 0.9 2.3  MONOABS 1.0 0.9 1.3* 1.1* 1.8*  EOSABS 0.0 0.0 0.0 0.0 0.1  BASOSABS 0.1 0.1 0.1 0.0 0.0    Chemistries  Recent Labs  Lab 01/13/20 0638 01/14/20 0238 01/15/20 0730 01/16/20 0311 01/17/20 0310  NA 133* 135 136 132* 135  K 4.1 4.5 4.1 3.9 3.4*  CL 93* 98 98 97* 99  CO2 22 20* 20* 18* 17*  GLUCOSE 210* 230* 237* 319* 262*  BUN 46* 61* 60* 77* 59*  CREATININE 12.16* 13.74* 11.27* 12.49* 10.94*  CALCIUM 9.4 8.8* 8.4* 8.9 8.4*  MG 2.1 2.0 2.0 1.9 2.0  AST 13* 11* 14* 9* 13*  ALT _0 ALKPHOS 88 75 102 98 98  BILITOT 0.6 0.7 0.5 0.6 0.5   ------------------------------------------------------------------------------------------------------------------ No results for input(s): CHOL, HDL, LDLCALC, TRIG, CHOLHDL, LDLDIRECT in the last 72 hours.  Lab Results  Component Value Date   HGBA1C 10.5 (H) 08/08/2018   ------------------------------------------------------------------------------------------------------------------ No results for input(s): TSH, T4TOTAL, T3FREE, THYROIDAB in the last 72 hours.  Invalid input(s): FREET3  Cardiac Enzymes No results for input(s): CKMB, TROPONINI, MYOGLOBIN in the last 168 hours.  Invalid input(s): CK ------------------------------------------------------------------------------------------------------------------    Component Value Date/Time   BNP 71.6 01/17/2020 0310    Micro Results Recent Results (from the past 240 hour(s))  SARS CORONAVIRUS 2 (TAT 6-24 HRS) Nasopharyngeal Nasopharyngeal Swab     Status: None   Collection Time: 01/08/20 10:10 AM   Specimen: Nasopharyngeal Swab  Result Value Ref Range Status   SARS Coronavirus 2 NEGATIVE NEGATIVE Final    Comment:  (NOTE) SARS-CoV-2 target nucleic acids are NOT DETECTED. The SARS-CoV-2 RNA is generally detectable in upper and lower respiratory specimens during the acute phase of infection. Negative results do not preclude SARS-CoV-2 infection, do not rule out co-infections with other pathogens, and should not be used as the sole basis for treatment or other patient management decisions. Negative results must be combined with clinical observations, patient history, and epidemiological information. The expected result is Negative. Fact Sheet for Patients: SugarRoll.be Fact Sheet for Healthcare Providers: https://www.woods-mathews.com/ This test is not yet approved or cleared by the [place] FDA and  has been authorized for detection and/or diagnosis of SARS-CoV-2 by FDA under an Emergency Use Authorization (EUA). This EUA will remain  in effect (meaning this test can be used) for the duration of the COVID-19 declaration under Section 56 4(b)(1) of the Act, 21 U.S.C. section 360bbb-3(b)(1), unless the authorization is terminated or revoked sooner. Performed at [facility], [place] [address], [place], [place] [address]   Respiratory Panel by RT PCR (Flu A&B, Covid) - Nasopharyngeal Swab     Status: Abnormal   Collection Time: 01/11/20  4:15 PM   Specimen: Nasopharyngeal Swab  Result Value Ref Range Status   SARS  Coronavirus 2 by RT PCR POSITIVE (A) NEGATIVE Final    Comment: RESULT CALLED TO, READ BACK BY AND VERIFIED WITH: Kathryne Sharper RN 0034 01/11/20 JM (NOTE) SARS-CoV-2 target nucleic acids are DETECTED. SARS-CoV-2 RNA is generally detectable in upper respiratory specimens  during the acute phase of infection. Positive results are indicative of the presence of the identified virus, but do not rule out bacterial infection or co-infection with other pathogens not detected by the test. Clinical correlation with patient history and other  diagnostic information is necessary to determine patient infection status. The expected result is Negative. Fact Sheet for Patients:  PinkCheek.be Fact Sheet for Healthcare Providers: GravelBags.it This test is not yet approved or cleared by the [place] FDA and  has been authorized for detection and/or diagnosis of SARS-CoV-2 by FDA under an Emergency Use Authorization (EUA).  This EUA will remain in effect (meaning this test can be used) for th e duration of  the COVID-19 declaration under Section 564(b)(1) of the Act, 21 U.S.C. section 360bbb-3(b)(1), unless the authorization is terminated or revoked sooner.    Influenza A by PCR NEGATIVE NEGATIVE Final   Influenza B by PCR NEGATIVE NEGATIVE Final    Comment: (NOTE) The Xpert Xpress SARS-CoV-2/FLU/RSV assay is intended as an aid in  the diagnosis of influenza from Nasopharyngeal swab specimens and  should not be used as a sole basis for treatment. Nasal washings and  aspirates are unacceptable for Xpert Xpress SARS-CoV-2/FLU/RSV  testing. Fact Sheet for Patients: PinkCheek.be Fact Sheet for Healthcare Providers: GravelBags.it This test is not yet approved or cleared by the [place] FDA and  has been authorized for detection and/or diagnosis of SARS-CoV-2 by  FDA under an Emergency Use Authorization (EUA). This EUA will remain  in effect (meaning this test can be used) for the duration of the  Covid-19 declaration under Section 564(b)(1) of the Act, 21  U.S.C. section 360bbb-3(b)(1), unless the authorization is  terminated or revoked. Performed at [facility], [place] [address], [place], [place] [address]   Culture, blood (routine x 2)     Status: Abnormal   Collection Time: 01/11/20  4:52 PM   Specimen: BLOOD  Result Value Ref Range Status   Specimen Description   Final    BLOOD RIGHT  ANTECUBITAL Performed at [facility] [address] [place], [place] [address]    Special Requests   Final    BOTTLES DRAWN AEROBIC AND ANAEROBIC Blood Culture adequate volume Performed at [facility] [address] [place], [place] [address]    Culture  Setup Time   Final    GRAM POSITIVE COCCI IN CHAINS AEROBIC BOTTLE ONLY CRITICAL RESULT CALLED TO, READ BACK BY AND VERIFIED WITH: PHARMD L CHEN [id number] AT 1011 BY CM Performed at [facility], [place] [address], [name], [place] [address]    Culture STREPTOCOCCUS PNEUMONIAE (A)  Final   Report Status 01/14/2020 FINAL  Final   Organism ID, Bacteria STREPTOCOCCUS PNEUMONIAE  Final      Susceptibility   Streptococcus pneumoniae - MIC*    ERYTHROMYCIN <=0.12 SENSITIVE Sensitive     LEVOFLOXACIN 0.5 SENSITIVE Sensitive     VANCOMYCIN 0.5 SENSITIVE Sensitive     PENICILLIN (meningitis) 0.12 RESISTANT Resistant     PENO - penicillin 0.12      PENICILLIN (non-meningitis) 0.12 SENSITIVE Sensitive     PENICILLIN (oral) 0.12 INTERMEDIATE Intermediate     CEFTRIAXONE (non-meningitis) <=0.12 SENSITIVE Sensitive  CEFTRIAXONE (meningitis) <=0.12 SENSITIVE Sensitive     * STREPTOCOCCUS PNEUMONIAE  Culture, blood (routine x 2)     Status: None   Collection Time: 01/11/20  5:46 PM   Specimen: BLOOD LEFT HAND  Result Value Ref Range Status   Specimen Description   Final    BLOOD LEFT HAND Performed at [facility] [address] [place], [place] [address]    Special Requests   Final    BOTTLES DRAWN AEROBIC ONLY Blood Culture adequate volume Performed at [facility] [address], [place], [place] [address]    Culture   Final    NO GROWTH 5 DAYS Performed at [facility], [place] [address], [place], [place] [address]    Report Status 01/16/2020 FINAL  Final  MRSA PCR Screening     Status: None   Collection Time: 01/12/20  2:28 AM   Specimen:  Nasal Mucosa; Nasopharyngeal  Result Value Ref Range Status   MRSA by PCR NEGATIVE NEGATIVE Final    Comment:        The GeneXpert MRSA Assay (FDA approved for NASAL specimens only), is one component of a comprehensive MRSA colonization surveillance program. It is not intended to diagnose MRSA infection nor to guide or monitor treatment for MRSA infections. Performed at [facility], [place] [address], [place], [place] [address]   Culture, blood (routine x 2)     Status: None (Preliminary result)   Collection Time: 01/14/20 12:10 PM   Specimen: BLOOD  Result Value Ref Range Status   Specimen Description BLOOD LEFT ANTECUBITAL  Final   Special Requests   Final    BOTTLES DRAWN AEROBIC ONLY Blood Culture results may not be optimal due to an inadequate volume of blood received in culture bottles   Culture   Final    NO GROWTH 3 DAYS Performed at [facility], [place] [address] [place], [place] [address]    Report Status PENDING  Incomplete  Culture, blood (routine x 2)     Status: None (Preliminary result)   Collection Time: 01/14/20 12:13 PM   Specimen: BLOOD LEFT HAND  Result Value Ref Range Status   Specimen Description BLOOD LEFT HAND  Final   Special Requests   Final    BOTTLES DRAWN AEROBIC ONLY Blood Culture results may not be optimal due to an inadequate volume of blood received in culture bottles   Culture   Final    NO GROWTH 3 DAYS Performed at [facility], [place] [address], [place], [place] [address]    Report Status PENDING  Incomplete    Radiology Reports CT ABDOMEN PELVIS WO CONTRAST  Result Date: 01/13/2020 CLINICAL DATA:  Follow-up of COVID pneumonia. Lung nodule on chest radiograph. Diffuse abdominal pain. EXAM: CT CHEST, ABDOMEN AND PELVIS WITHOUT CONTRAST TECHNIQUE: Multidetector CT imaging of the chest, abdomen and pelvis was performed following the standard protocol without IV contrast. COMPARISON:  Chest radiograph 01/12/2020. CT of the  abdomen and pelvis of 12/05/2018. CTA of the chest of 03/04/2017. FINDINGS: CT CHEST FINDINGS Cardiovascular: A left sided central line terminates in the right atrium. Aortic atherosclerosis. Tortuous thoracic aorta. Moderate cardiomegaly, without pericardial effusion. Multivessel coronary artery atherosclerosis. Pulmonary artery enlargement, outflow tract 3.6 cm. 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 Mediastinum/Nodes: Right paratracheal node of 1.1 cm Hilar regions poorly evaluated without intravenous contrast. Lungs/Pleura: No pleural fluid.  Mild centrilobular emphysema. Right greater than left  peripheral upper lobe airspace disease with mild surrounding ground-glass. Musculoskeletal: No acute osseous abnormality. CT ABDOMEN PELVIS FINDINGS Hepatobiliary: Hepatomegaly at 19.2 cm craniocaudal. Hepatic morphology, including caudate lobe enlargement and irregular hepatic capsule, highly suspicious for mild cirrhosis. Normal gallbladder, without biliary ductal dilatation. Pancreas: Normal, without mass or ductal dilatation. Spleen: Normal in size, without focal abnormality. Adrenals/Urinary Tract: Mild bilateral adrenal thickening. Native renal atrophy on the left. Right nephrectomy. Multiple low-density left renal lesions are likely cysts. No bladder calculi. Stomach/Bowel: Normal stomach, without wall thickening. Scattered colonic diverticula. Wall thickening within the sigmoid with mild surrounding edema, including on 95/3. No pericolonic fluid collection or free perforation. Separate area of pericolonic edema posterior to the ascending colon including on 84/3. Vascular/Lymphatic: Aortic and branch vessel atherosclerosis. Reproductive: No abdominopelvic adenopathy.  Normal prostate. Other: Fat containing left inguinal hernia. No significant free fluid. Tiny periumbilical fat containing ventral abdominal wall laxity. Musculoskeletal: Renal osteodystrophy.  Lumbosacral  spondylosis. IMPRESSION: CT CHEST IMPRESSION 1. Right greater than left posterior upper lobe airspace and ground-glass opacity. Most consistent with pneumonia. This is not atypical appearance of COVID-19 pneumonia, which cannot be excluded. Other infectious etiologies should also be considered. 2. Aortic atherosclerosis (ICD10-I70.0), coronary artery atherosclerosis and emphysema (ICD10-J43.9). 3. Mild thoracic adenopathy, likely reactive. 4. Pulmonary artery enlargement suggests pulmonary arterial hypertension. CT ABDOMEN AND PELVIS IMPRESSION 1. Sigmoid wall thickening and pericolonic edema, likely related to non complicated diverticulitis. Separate edema posterior to the ascending colon is nonspecific and could represent an area of colitis or mild diverticulitis. 2. Hepatomegaly. Hepatic morphology, highly suspicious for cirrhosis. Electronically Signed   By: Abigail Miyamoto M.D.   On: 01/13/2020 12:36   CT CHEST WO CONTRAST  Result Date: 01/13/2020 CLINICAL DATA:  Follow-up of COVID pneumonia. Lung nodule on chest radiograph. Diffuse abdominal pain. EXAM: CT CHEST, ABDOMEN AND PELVIS WITHOUT CONTRAST TECHNIQUE: Multidetector CT imaging of the chest, abdomen and pelvis was performed following the standard protocol without IV contrast. COMPARISON:  Chest radiograph 01/12/2020. CT of the abdomen and pelvis of 12/05/2018. CTA of the chest of 03/04/2017. FINDINGS: CT CHEST FINDINGS Cardiovascular: A left sided central line terminates in the right atrium. Aortic atherosclerosis. Tortuous thoracic aorta. Moderate cardiomegaly, without pericardial effusion. Multivessel coronary artery atherosclerosis. Pulmonary artery enlargement, outflow tract 3.6 cm. 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 Mediastinum/Nodes: Right paratracheal node of 1.1 cm Hilar regions poorly evaluated without intravenous contrast. Lungs/Pleura: No pleural fluid.  Mild centrilobular emphysema. Right greater than  left peripheral upper lobe airspace disease with mild surrounding ground-glass. Musculoskeletal: No acute osseous abnormality. CT ABDOMEN PELVIS FINDINGS Hepatobiliary: Hepatomegaly at 19.2 cm craniocaudal. Hepatic morphology, including caudate lobe enlargement and irregular hepatic capsule, highly suspicious for mild cirrhosis. Normal gallbladder, without biliary ductal dilatation. Pancreas: Normal, without mass or ductal dilatation. Spleen: Normal in size, without focal abnormality. Adrenals/Urinary Tract: Mild bilateral adrenal thickening. Native renal atrophy on the left. Right nephrectomy. Multiple low-density left renal lesions are likely cysts. No bladder calculi. Stomach/Bowel: Normal stomach, without wall thickening. Scattered colonic diverticula. Wall thickening within the sigmoid with mild surrounding edema, including on 95/3. No pericolonic fluid collection or free perforation. Separate area of pericolonic edema posterior to the ascending colon including on 84/3. Vascular/Lymphatic: Aortic and branch vessel atherosclerosis. Reproductive: No abdominopelvic adenopathy.  Normal prostate. Other: Fat containing left inguinal hernia. No significant free fluid. Tiny periumbilical fat containing ventral abdominal wall laxity. Musculoskeletal: Renal osteodystrophy.  Lumbosacral spondylosis. IMPRESSION: CT CHEST IMPRESSION 1. Right greater than left posterior  upper lobe airspace and ground-glass opacity. Most consistent with pneumonia. This is not atypical appearance of COVID-19 pneumonia, which cannot be excluded. Other infectious etiologies should also be considered. 2. Aortic atherosclerosis (ICD10-I70.0), coronary artery atherosclerosis and emphysema (ICD10-J43.9). 3. Mild thoracic adenopathy, likely reactive. 4. Pulmonary artery enlargement suggests pulmonary arterial hypertension. CT ABDOMEN AND PELVIS IMPRESSION 1. Sigmoid wall thickening and pericolonic edema, likely related to non complicated  diverticulitis. Separate edema posterior to the ascending colon is nonspecific and could represent an area of colitis or mild diverticulitis. 2. Hepatomegaly. Hepatic morphology, highly suspicious for cirrhosis. Electronically Signed   By: Abigail Miyamoto M.D.   On: 01/13/2020 12:36   IR Removal Tun Cv Cath W/O FL  Result Date: 01/16/2020 INDICATION: Streptococcus pneumoniae bacteremia. Request for removal of tunneled hemodialysis catheter. Initially placed at an outside facility. EXAM: REMOVAL OF TUNNELED HEMODIALYSIS CATHETER MEDICATIONS: 1% lidocaine with epinephrine 4 mL COMPLICATIONS: None immediate. PROCEDURE: Informed written consent was obtained from the patient following an explanation of the procedure, risks, benefits and alternatives to treatment. A time out was performed prior to the initiation of the procedure. Maximal barrier sterile technique was utilized including caps, mask, sterile gowns, sterile gloves, large sterile drape, hand hygiene, and Betadine. 1% lidocaine with epinephrine was injected under sterile conditions along the subcutaneous tunnel. Utilizing a combination of blunt dissection and gentle traction, the catheter was removed intact. Hemostasis was obtained with manual compression. A dressing was placed. The patient tolerated the procedure well without immediate post procedural complication. IMPRESSION: Successful removal of tunneled dialysis catheter. Read by: Gareth Eagle, PA-C Electronically Signed   By: Jacqulynn Cadet M.D.   On: 01/16/2020 14:54   DG Chest Port 1 View  Result Date: 01/14/2020 CLINICAL DATA:  Shortness of breath, COVID positive EXAM: PORTABLE CHEST 1 VIEW COMPARISON:  01/12/2020 FINDINGS: Left dialysis catheter remains in place, unchanged. Patchy airspace disease in the right upper lobe again noted. Overall, airspace disease throughout the right lung has improved. No confluent opacity on the left. No effusions. No acute bony abnormality. Heart is borderline in  size. IMPRESSION: Improving airspace disease with mild residual patchy right upper lobe opacities. Electronically Signed   By: Rolm Baptise M.D.   On: 01/14/2020 11:33   DG Chest Port 1 View  Result Date: 01/12/2020 CLINICAL DATA:  Short of breath EXAM: PORTABLE CHEST 1 VIEW COMPARISON:  01/11/2020 FINDINGS: Right upper lobe airspace disease unchanged. Improved aeration in the lung bases with improved lung volume. Negative for edema or effusion. Central venous catheter tip in the right atrium unchanged. IMPRESSION: Persistent right upper lobe airspace disease unchanged Improved aeration lungs with decrease in bibasilar atelectasis. Electronically Signed   By: Franchot Gallo M.D.   On: 01/12/2020 09:16   DG Chest Port 1 View  Result Date: 01/11/2020 CLINICAL DATA:  Shortness of breath EXAM: PORTABLE CHEST 1 VIEW COMPARISON:  01/10/2019 FINDINGS: There is bilateral diffuse interstitial thickening. There is a nodular appearance along the right suprahilar region likely reflecting prominent pulmonary vasculature or airspace disease. There is no pleural effusion or pneumothorax. The heart mediastinum are stable. There is a large bore left-sided central venous catheter with the tip projecting over the cavoatrial junction. There is no acute osseous abnormality. IMPRESSION: Diffuse bilateral interstitial thickening likely reflecting interstitial edema. Nodular appearance along the right suprahilar region likely reflecting prominent pulmonary vasculature or airspace disease. Electronically Signed   By: Kathreen Devoid   On: 01/11/2020 16:31   DG Chest Port 1 [id number]  Result Date: 01/11/2020 CLINICAL DATA:  Cough, short of breath EXAM: PORTABLE CHEST 1 VIEW COMPARISON:  03/07/2017 FINDINGS: Single frontal view of the chest demonstrates a left internal jugular dialysis catheter tip overlying atrial caval junction. Vascular stent overlies the right axilla. Cardiac silhouette is stable. There is a 7.3 cm mass projecting  over the right anterior first and second ribs. CT chest with IV contrast recommended for suspected neoplasm. There is central vascular congestion with diffuse interstitial prominence consistent with mild fluid overload. Right hilar prominence could also reflect lymphadenopathy. No effusion or pneumothorax. No acute bony abnormality. IMPRESSION: 1. 7.3 cm right upper lobe mass consistent with neoplasm. CT chest with IV contrast is recommended. 2. Right hilar prominence could reflect underlying adenopathy. 3. Vascular congestion and interstitial edema. Electronically Signed   By: Randa Ngo M.D.   On: 01/11/2020 04:02   VAS [place] UPPER EXTREMITY ARTERIAL DUPLEX  Result Date: 12/25/2019 UPPER EXTREMITY DUPLEX STUDY Indications: Pre-operative exam.  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 Bilateral testing is considered an integral part of a complete examination. Limited examinations for reoccurring indications may be performed as noted.  Right Pre-Dialysis Findings: +-----------------------+----------+--------------------+---------+--------+ Location               PSV (cm/s)Intralum. Diam. (cm)Waveform Comments +-----------------------+----------+--------------------+---------+--------+ Brachial Antecub. fossa40        0.46                triphasic         +-----------------------+----------+--------------------+---------+--------+ Radial Art at Wrist    35        0.29                triphasic         +-----------------------+----------+--------------------+---------+--------+ Ulnar Art at Wrist     81        0.27                triphasic          +-----------------------+----------+--------------------+---------+--------+ +------------+-----------------+ Allen's TestBranches at fossa +------------+-----------------+ Left Pre-Dialysis Findings: +-----------------------+----------+--------------------+---------+--------+ Location               PSV (cm/s)Intralum. Diam. (cm)Waveform Comments +-----------------------+----------+--------------------+---------+--------+ Brachial Antecub. fossa54        0.52                triphasic         +-----------------------+----------+--------------------+---------+--------+ Radial Art at Wrist    44        0.27                triphasictortuous +-----------------------+----------+--------------------+---------+--------+ Ulnar Art at Wrist     73        0.19                triphasic         +-----------------------+----------+--------------------+---------+--------+ +------------+-----------------+ Allen's TestBranches at fossa +------------+-----------------+  Summary:   Measurements above. *See table(s) above for measurements and observations. Electronically signed by Curt Jews MD on 12/25/2019 at 9:30:15 AM.    Final    VAS [place] UPPER EXTREMITY VEIN MAPPING  Result Date: 12/25/2019 UPPER EXTREMITY VEIN MAPPING  Indications: Pre-access. History: History of right Radiocephalic fistula years ago.  Performing Technologist: Manuela Schwartz  Skeel RVT  Examination Guidelines: A complete evaluation includes B-mode imaging, spectral Doppler, color Doppler, and power Doppler as needed of all accessible portions of each vessel. Bilateral testing is considered an integral part of a complete examination. Limited examinations for reoccurring indications may be performed as noted. +-----------------+-------------+----------+--------+ Right Cephalic   Diameter (cm)Depth (cm)Findings +-----------------+-------------+----------+--------+ Prox upper arm                             [place]     +-----------------+-------------+----------+--------+ Mid upper arm                              [place]    +-----------------+-------------+----------+--------+ Dist upper arm                             [place]    +-----------------+-------------+----------+--------+ Antecubital fossa                          [place]    +-----------------+-------------+----------+--------+ +-----------------+-------------+----------+---------+ Right Basilic    Diameter (cm)Depth (cm)Findings  +-----------------+-------------+----------+---------+ Prox upper arm       0.46                         +-----------------+-------------+----------+---------+ Mid upper arm        0.68               branching +-----------------+-------------+----------+---------+ Dist upper arm       0.34                         +-----------------+-------------+----------+---------+ Antecubital fossa    0.31                         +-----------------+-------------+----------+---------+ Prox forearm         0.27                         +-----------------+-------------+----------+---------+ +-----------------+-------------+----------+--------+ Left Cephalic    Diameter (cm)Depth (cm)Findings +-----------------+-------------+----------+--------+ Shoulder             0.47                        +-----------------+-------------+----------+--------+ Prox upper arm       0.36                        +-----------------+-------------+----------+--------+ Mid upper arm        0.41                        +-----------------+-------------+----------+--------+ Dist upper arm       0.43                        +-----------------+-------------+----------+--------+ Antecubital fossa    0.54                        +-----------------+-------------+----------+--------+ Prox forearm         0.43                        +-----------------+-------------+----------+--------+ Mid forearm  0.38                         +-----------------+-------------+----------+--------+ Dist forearm         0.33                        +-----------------+-------------+----------+--------+ +-----------------+-------------+----------+--------+ Left Basilic     Diameter (cm)Depth (cm)Findings +-----------------+-------------+----------+--------+ Prox upper arm       0.48                        +-----------------+-------------+----------+--------+ Mid upper arm        0.51                        +-----------------+-------------+----------+--------+ Dist upper arm       0.47                        +-----------------+-------------+----------+--------+ Antecubital fossa    0.26                        +-----------------+-------------+----------+--------+ Prox forearm         0.25                        +-----------------+-------------+----------+--------+ Summary: Right: Occluded right forearm fistula. Thrombus in the cephalic vein        from the antecubital fossa extending proximally into the        subclavian vein, appearing chronic. Left: Measurements above. *See table(s) above for measurements and observations.  Diagnosing physician: Curt Jews MD Electronically signed by Curt Jews MD on 12/25/2019 at 9:27:41 AM.    Final

## 2020-01-17 NOTE — Progress Notes (Signed)
Patient ID: [name], male   DOB: 06-24-1956, 64 y.o.   MRN: [id number]   Pt is tentatively scheduled for IR procedure 3/5 Orders in place for same  We will call for pt when can We will consent pt in IR

## 2020-01-17 NOTE — Progress Notes (Addendum)
Physical Therapy Treatment Patient Details Name: [name] MRN: [id number] DOB: 03/01/1956 Today's Date: 01/17/2020    History of Present Illness Pt is a 64 y.o. male admitted 01/11/20 with c/o "not feeling well." Worked revealed COVID-19 infection with PNA. CXR with RUL mass suspicious for postobstructive bacterial PNA. PMH includes HTN, OSA, DM2, ESRD (HF MWF), Hep-C, pt reports R charcot foot deformity (wears boot).    PT Comments    Pt lying supine in bed HOB elevated on room air on entry. Pt reports "feeling tired and his neck hurting him." Pt has been hypotensive since this morning and very lethargic. Therapy modified in duration and intensity due to BP concerns. Please see BP chart below. Pt performs bed mobility and seated therapeutic exercise EOB due to limited activity tolerance and medical status today. Pt transfers supine<>sit mod I with increased time for setup and HOB elevated. Pt is able to sit EOB for 15 minutes to complete UE, LE, and flutter valve exercises. Pt significantly fatigued after 15 minutes and lies back down in bed. Pt refuses to sit up in recliner at this time. RN notified about vitals and activity tolerance. D/C plans remain appropriate. PT will continue to follow acutely.   Orthostatic BPs  Supine HOB elevated 78/42  Sitting EOB 71/59  Supine HOB elevated 95/73     Follow Up Recommendations  Home health PT;Supervision - Intermittent     Equipment Recommendations  Rolling walker with 5" wheels       Precautions / Restrictions Precautions Precautions: Fall;Other (comment)(airborne) Precaution Comments: R charcot foot deformity w/ boot pt wears at baseline (currently top strap is broken but able to be used by wrapping it around and securing) Restrictions Weight Bearing Restrictions: No    Mobility  Bed Mobility Overal bed mobility: Modified Independent             General bed mobility comments: Supine<>sit, increased time and HOB  elevated  Transfers                 General transfer comment: Pt is hypotensive today and inappropriate/ unwilling to stand today. Thus, bed mobility and exercises EOB are performed.        Balance Overall balance assessment: Needs assistance Sitting-balance support: No upper extremity supported;Bilateral upper extremity supported Sitting balance-Leahy Scale: Fair Sitting balance - Comments: Able to sit EOB without UE support when cued. However, due to fatigue pt tends to lean laterally and place a forearm on the bed and/or relies on UE support to maintain his upright balance.  Postural control: Other (comment)(Leans to either side when fatigued)                                  Cognition Arousal/Alertness: Awake/alert;Lethargic Behavior During Therapy: WFL for tasks assessed/performed Overall Cognitive Status: Within Functional Limits for tasks assessed                                 General Comments: Pt is lethargic and hypotensive today. Not his normal sarcastic self. He is flatter and has low energy.      Exercises General Exercises - Upper Extremity Shoulder Flexion: AROM;Strengthening;Both;10 reps;Seated;Theraband(D2 flexion with green theraband) Shoulder ADduction: Other (comment);Theraband;Seated;Strengthening;AROM;10 reps;Both(Shoulder ER (shoulder squeezes)) Elbow Flexion: Strengthening;AROM;Both;10 reps;Seated;Theraband(green theraband) General Exercises - Lower Extremity Long Arc Quad: AROM;Strengthening;Both;10 reps;Seated Hip Flexion/Marching: AROM;Strengthening;10 reps;Both;Seated(with cues to keep  arms in lap and maintain upright trunk) Other Exercises Other Exercises: flutter valve x 10 max expiration    General Comments General comments (skin integrity, edema, etc.): Pt is hypotensive and lethargic today. Pt is on room air and SpO2 is at 91%. BP supine with HOB elevated is 78/42. Sitting EOB BP is 71/59. Pt sits EOB for  approximately 15 minutes. SpO2 drops to 89% while EOB but quickly rises. Pt lies back down in bed and BP is then 95/73 and SpO2 is back up to 92%.      Pertinent Vitals/Pain Pain Assessment: Faces Faces Pain Scale: Hurts a little bit Pain Location: Neck Pain Descriptors / Indicators: Discomfort Pain Intervention(s): Limited activity within patient's tolerance;Monitored during session;Repositioned           PT Goals (current goals can now be found in the care plan section) Acute Rehab PT Goals Patient Stated Goal: to go home PT Goal Formulation: With patient Time For Goal Achievement: 01/27/20 Potential to Achieve Goals: Good Progress towards PT goals: Not progressing toward goals - comment(due to being hypotensive today, tx modified)    Frequency    Min 3X/week      PT Plan Current plan remains appropriate       AM-PAC PT "6 Clicks" Mobility   Outcome Measure  Help needed turning from your back to your side while in a flat bed without using bedrails?: A Little Help needed moving from lying on your back to sitting on the side of a flat bed without using bedrails?: A Little Help needed moving to and from a bed to a chair (including a wheelchair)?: A Little Help needed standing up from a chair using your arms (e.g., wheelchair or bedside chair)?: A Little Help needed to walk in hospital room?: A Little Help needed climbing 3-5 steps with a railing? : A Lot 6 Click Score: 17    End of Session   Activity Tolerance: Patient limited by lethargy Patient left: in bed;with call bell/phone within reach Nurse Communication: Mobility status;Other (comment)(hypotensive; BP) PT Visit Diagnosis: Unsteadiness on feet (R26.81);Other abnormalities of gait and mobility (R26.89)     Time: 2423-5361 PT Time Calculation (min) (ACUTE ONLY): 23 min  Charges:  $Therapeutic Exercise: 8-22 mins $Therapeutic Activity: 8-22 mins                     Jodelle Green, PT, DPT Acute  Rehabilitation Services Office [phone]   Jodelle Green 01/17/2020, 1:28 PM

## 2020-01-18 ENCOUNTER — Inpatient Hospital Stay (HOSPITAL_COMMUNITY): Payer: Medicare Other

## 2020-01-18 HISTORY — PX: IR US GUIDE VASC ACCESS LEFT: IMG2389

## 2020-01-18 HISTORY — PX: IR FLUORO GUIDE CV LINE LEFT: IMG2282

## 2020-01-18 LAB — COMPREHENSIVE METABOLIC PANEL
ALT: 11 U/L (ref 0–44)
AST: 13 U/L — ABNORMAL LOW (ref 15–41)
Albumin: 2.8 g/dL — ABNORMAL LOW (ref 3.5–5.0)
Alkaline Phosphatase: 78 U/L (ref 38–126)
Anion gap: 21 — ABNORMAL HIGH (ref 5–15)
BUN: 82 mg/dL — ABNORMAL HIGH (ref 8–23)
CO2: 15 mmol/L — ABNORMAL LOW (ref 22–32)
Calcium: 9 mg/dL (ref 8.9–10.3)
Chloride: 100 mmol/L (ref 98–111)
Creatinine, Ser: 14.26 mg/dL — ABNORMAL HIGH (ref 0.61–1.24)
GFR calc Af Amer: 4 mL/min — ABNORMAL LOW (ref >60)
GFR calc non Af Amer: 3 mL/min — ABNORMAL LOW (ref >60)
Glucose, Bld: 209 mg/dL — ABNORMAL HIGH (ref 70–99)
Potassium: 4.6 mmol/L (ref 3.5–5.1)
Sodium: 136 mmol/L (ref 135–145)
Total Bilirubin: 0.4 mg/dL (ref 0.3–1.2)
Total Protein: 6.9 g/dL (ref 6.5–8.1)

## 2020-01-18 LAB — BRAIN NATRIURETIC PEPTIDE: B Natriuretic Peptide: 76.2 pg/mL (ref 0.0–100.0)

## 2020-01-18 LAB — GLUCOSE, CAPILLARY
Glucose-Capillary: 254 mg/dL — ABNORMAL HIGH (ref 70–99)
Glucose-Capillary: 152 mg/dL — ABNORMAL HIGH (ref 70–99)
Glucose-Capillary: 213 mg/dL — ABNORMAL HIGH (ref 70–99)
Glucose-Capillary: 146 mg/dL — ABNORMAL HIGH (ref 70–99)
Glucose-Capillary: 92 mg/dL (ref 70–99)
Glucose-Capillary: 113 mg/dL — ABNORMAL HIGH (ref 70–99)

## 2020-01-18 LAB — PROTIME-INR
INR: 1.2 (ref 0.8–1.2)
Prothrombin Time: 15.5 s — ABNORMAL HIGH (ref 11.4–15.2)

## 2020-01-18 LAB — CBC WITH DIFFERENTIAL/PLATELET
Abs Immature Granulocytes: 2.4 10*3/uL — ABNORMAL HIGH (ref 0.00–0.07)
Basophils Absolute: 0 10*3/uL (ref 0.0–0.1)
Basophils Relative: 0 %
Eosinophils Absolute: 0 10*3/uL (ref 0.0–0.5)
Eosinophils Relative: 0 %
HCT: 36 % — ABNORMAL LOW (ref 39.0–52.0)
Hemoglobin: 11.1 g/dL — ABNORMAL LOW (ref 13.0–17.0)
Lymphocytes Relative: 3 %
Lymphs Abs: 0.8 10*3/uL (ref 0.7–4.0)
MCH: 23.5 pg — ABNORMAL LOW (ref 26.0–34.0)
MCHC: 30.8 g/dL (ref 30.0–36.0)
MCV: 76.1 fL — ABNORMAL LOW (ref 80.0–100.0)
Metamyelocytes Relative: 2 %
Monocytes Absolute: 1.6 10*3/uL — ABNORMAL HIGH (ref 0.1–1.0)
Monocytes Relative: 6 %
Myelocytes: 6 %
Neutro Abs: 21.6 10*3/uL — ABNORMAL HIGH (ref 1.7–7.7)
Neutrophils Relative %: 82 %
Platelets: 219 10*3/uL (ref 150–400)
Promyelocytes Relative: 1 %
RBC: 4.73 MIL/uL (ref 4.22–5.81)
RDW: 18.9 % — ABNORMAL HIGH (ref 11.5–15.5)
WBC: 26.3 10*3/uL — ABNORMAL HIGH (ref 4.0–10.5)
nRBC: 0.3 % — ABNORMAL HIGH (ref 0.0–0.2)
nRBC: 2 /100 WBC — ABNORMAL HIGH

## 2020-01-18 LAB — PROCALCITONIN: Procalcitonin: 4.45 ng/mL

## 2020-01-18 LAB — C-REACTIVE PROTEIN: CRP: 4.2 mg/dL — ABNORMAL HIGH

## 2020-01-18 LAB — D-DIMER, QUANTITATIVE: D-Dimer, Quant: 2.07 ug/mL-FEU — ABNORMAL HIGH (ref 0.00–0.50)

## 2020-01-18 LAB — MAGNESIUM: Magnesium: 2.1 mg/dL (ref 1.7–2.4)

## 2020-01-18 MED ORDER — HEPARIN SODIUM (PORCINE) 1000 UNIT/ML IJ SOLN
INTRAMUSCULAR | Status: AC
  Administered 2020-01-19: 5000 [IU] via INTRAVENOUS_CENTRAL
  Filled 2020-01-18: qty 1

## 2020-01-18 MED ORDER — ONDANSETRON HCL 4 MG/2ML IJ SOLN: 4.0000 mg | Freq: Four times a day (QID) | INTRAMUSCULAR | Status: DC | PRN

## 2020-01-18 MED ORDER — AMOXICILLIN-POT CLAVULANATE 500-125 MG PO TABS
1.0000 | ORAL_TABLET | Freq: Every day | ORAL | Status: DC
  Administered 2020-01-19: 500 mg via ORAL
  Filled 2020-01-18: qty 1

## 2020-01-18 MED ORDER — FENTANYL CITRATE (PF) 100 MCG/2ML IJ SOLN
INTRAMUSCULAR | Status: AC | PRN
  Administered 2020-01-18 (×2): 50 ug via INTRAVENOUS

## 2020-01-18 MED ORDER — LIDOCAINE-EPINEPHRINE (PF) 1 %-1:200000 IJ SOLN
INTRAMUSCULAR | Status: AC | PRN
  Administered 2020-01-18: 10 mL

## 2020-01-18 MED ORDER — MIDAZOLAM HCL 2 MG/2ML IJ SOLN
INTRAMUSCULAR | Status: AC | PRN
  Administered 2020-01-18 (×2): 1 mg via INTRAVENOUS

## 2020-01-18 MED ORDER — MIDAZOLAM HCL 2 MG/2ML IJ SOLN
INTRAMUSCULAR | Status: AC
  Filled 2020-01-18: qty 4

## 2020-01-18 MED ORDER — LIDOCAINE HCL (PF) 1 % IJ SOLN
INTRAMUSCULAR | Status: AC | PRN
  Administered 2020-01-18: 10 mL

## 2020-01-18 MED ORDER — FENTANYL CITRATE (PF) 100 MCG/2ML IJ SOLN
INTRAMUSCULAR | Status: AC
  Filled 2020-01-18: qty 2

## 2020-01-18 MED ORDER — LIDOCAINE HCL 1 % IJ SOLN
INTRAMUSCULAR | Status: AC
  Filled 2020-01-18: qty 20

## 2020-01-18 NOTE — Progress Notes (Addendum)
[facility] NEPHROLOGY PROGRESS NOTE  Assessment/ Plan: Pt is a 64 y.o. yo male    Outpt HD:MWF [facility], 5h 550/800 132kg 2/2 bath P4 AVG Hep 10K+ 2K midrunCalcitriol 3.25 ug tiw,sensipar 180 qhd,venofer 50 /wk,mirc 60 q 2, last 2/22  #COVID-19 pneumonia/acute hypoxic respiratory failure: On remdesivir, antibiotics per primary team.    # Streptococcal bacteremia: The HD tunnel catheter was removed on 3/3.  The repeat blood cultures remains negative.  On augmentin.   # ESRD MWF schedule: HD today after placement of new tunneled catheter by IR.   # Anemia of CKD: Continue ESA. Hb at goal.  Monitor hemoglobin.  Received Mircera on 2/22.  # Secondary hyperparathyroidism: phos high therefore increased auryxia dose. On Sensipar.  Monitor Ca, phos level.   # HTN/volume: Continue current antihypertensive medication and the UF during HD.  Discussed with the primary team.  Addendum2;25 pm  Patient is probably going to IR later today for catheter change.  He will be TTS schedule for outpatient dialysis because of Covid positive.  We will postpone his dialysis to tomorrow morning so that he will be in new schedule.  Subjective: Chart and lab results reviewed.  Patient was concerned about IR procedure later in the day and being hungry.  No chest pain, shortness of breath.   Objective Vital signs in last 24 hours: Vitals:   01/17/20 2105 01/18/20 0509 01/18/20 0517 01/18/20 1017  BP: 97/62  (!) 95/55 97/84  Pulse: 62  80   Resp:   18   Temp:   97.8 F (36.6 C)   TempSrc:   Oral   SpO2:   95%   Weight:  132.8 kg    Height:       Weight change: 3.8 kg No intake or output data in the 24 hours ending 01/18/20 1108     Labs: Basic Metabolic Panel: Recent Labs  Lab 01/15/20 0730 01/16/20 0311 01/17/20 0310 01/17/20 1048  NA 136 132* 135  --   K 4.1 3.9 3.4*  --   CL 98 97* 99  --   CO2 20* 18* 17*  --   GLUCOSE 237* 319* 262*  --   BUN 60* 77* 59*  --    CREATININE 11.27* 12.49* 10.94*  --   CALCIUM 8.4* 8.9 8.4*  --   PHOS  --  6.6*  --  5.0*   Liver Function Tests: Recent Labs  Lab 01/15/20 0730 01/16/20 0311 01/17/20 0310  AST 14* 9* 13*  ALT 11 10 13   ALKPHOS 102 98 98  BILITOT 0.5 0.6 0.5  PROT 7.4 6.6 6.7  ALBUMIN 2.7* 2.4* 2.6*   No results for input(s): LIPASE, AMYLASE in the last 168 hours. No results for input(s): AMMONIA in the last 168 hours. CBC: Recent Labs  Lab 01/14/20 0238 01/14/20 0238 01/15/20 0730 01/15/20 0730 01/16/20 0311 01/17/20 0310 01/18/20 0816  WBC 25.8*   < > 22.1*   < > 21.4* 23.9* 26.3*  NEUTROABS 22.7*   < > 17.0*   < > 18.8* 16.6* 21.6*  HGB 9.1*   < > 10.4*   < > 10.4* 10.6* 11.1*  HCT 29.2*   < > 33.7*   < > 34.1* 34.9* 36.0*  MCV 74.3*  --  76.1*  --  75.8* 75.5* 76.1*  PLT 297   < > 286   < > 277 257 219   < > = values in this interval not displayed.   Cardiac  Enzymes: No results for input(s): CKTOTAL, CKMB, CKMBINDEX, TROPONINI in the last 168 hours. CBG: Recent Labs  Lab 01/17/20 0815 01/17/20 1243 01/17/20 1635 01/17/20 2104 01/18/20 0807  GLUCAP 212* 207* 157* 129* 213*    Iron Studies:  No results for input(s): IRON, TIBC, TRANSFERRIN, FERRITIN in the last 72 hours. Studies/Results: IR Removal Tun Cv Cath W/O FL  Result Date: 01/16/2020 INDICATION: Streptococcus pneumoniae bacteremia. Request for removal of tunneled hemodialysis catheter. Initially placed at an outside facility. EXAM: REMOVAL OF TUNNELED HEMODIALYSIS CATHETER MEDICATIONS: 1% lidocaine with epinephrine 4 mL COMPLICATIONS: None immediate. PROCEDURE: Informed written consent was obtained from the patient following an explanation of the procedure, risks, benefits and alternatives to treatment. A time out was performed prior to the initiation of the procedure. Maximal barrier sterile technique was utilized including caps, mask, sterile gowns, sterile gloves, large sterile drape, hand hygiene, and Betadine.  1% lidocaine with epinephrine was injected under sterile conditions along the subcutaneous tunnel. Utilizing a combination of blunt dissection and gentle traction, the catheter was removed intact. Hemostasis was obtained with manual compression. A dressing was placed. The patient tolerated the procedure well without immediate post procedural complication. IMPRESSION: Successful removal of tunneled dialysis catheter. Read by: Gareth Eagle, PA-C Electronically Signed   By: Jacqulynn Cadet M.D.   On: 01/16/2020 14:54    Medications: Infusions: . chlorproMAZINE (THORAZINE) IV 25 mg (01/12/20 1949)    Scheduled Medications: . allopurinol  100 mg Oral BID  . amLODipine  5 mg Oral Daily  . amoxicillin-clavulanate  1 tablet Oral BID  . carvedilol  6.25 mg Oral BID WC  . Chlorhexidine Gluconate Cloth  6 each Topical Daily  . Chlorhexidine Gluconate Cloth  6 each Topical Q0600  . Chlorhexidine Gluconate Cloth  6 each Topical Q0600  . cinacalcet  90 mg Oral Q M,W,F  . diphenhydrAMINE  25 mg Oral Q M,W,F  . ferric citrate  630 mg Oral BID WC  . furosemide  40 mg Oral Daily  . insulin aspart  0-15 Units Subcutaneous TID WC  . pantoprazole  40 mg Oral Daily  . pravastatin  20 mg Oral Daily    have reviewed scheduled and prn medications.  Physical Exam: General:NAD, comfortable, sitting on chair Heart:RRR, s1s2 nl Lungs: Clear bilateral, no wheezing Abdomen:soft, Non-tender, non-distended Extremities: No LE edema Dialysis Access: No HD access today.  Joanann Mies Tanna Furry 01/18/2020,11:08 AM  LOS: 7 days  Pager: [phone]

## 2020-01-18 NOTE — Progress Notes (Signed)
Renal Navigator attempted to call patient on cell phone listed in Epic, but he did not answer. Renal Navigator called his wife/Dale on cell phone listed in Epic. She was very friendly and talkative. Navigator informed her of OP HD treatment in COVID isolation shift at [facility] at discharge, TTS 12:00pm. Patient's wife states that patient will drive himself to HD. Navigator provided clinic phone number and instructed her to have patient call when he arrives to clinic in order to be ushered in for treatment each time. She states understanding.  Renal Navigator notes that patient is still on MWF schedule here in the hospital, but that OP HD COVID shift is TTS and he will need to be transitioned to OP HD schedule at some point. He is getting a new TDC placed today, but this has not occurred yet. Renal Navigator discussed this with Nephrologist/Dr. Carolin Sicks to request that patient have inpt HD tomorrow in order to get on TTS schedule. He asked Navigator to instruct inpt HD staff to change patient's HD order from today to tomorrow. Renal Navigator discussed this with HD Charge RN/[place].  Patient updated Attending/Dr. Candiss Norse and called patient's wife to update.   Alphonzo Cruise, [place] Renal Navigator [phone]

## 2020-01-18 NOTE — Progress Notes (Addendum)
Occupational Therapy Treatment Patient Details Name: CARRINGTON OLAZABAL MRN: [id number] DOB: 07-10-1956 Today's Date: 01/18/2020    History of present illness Pt is a 64 y.o. male admitted 01/11/20 with c/o "not feeling well." Worked revealed COVID-19 infection with PNA. CXR with RUL mass suspicious for postobstructive bacterial PNA. PMH includes HTN, OSA, DM2, ESRD (HF MWF), Hep-C, pt reports R charcot foot deformity (wears boot).   OT comments  PTA, pt Independent with ADLs and mobility. Pt progressing with functional status and typically eager to work with therapies. Today, pt expressed frustration with DC planning complications. OT provided encouragement with pt appearing more calm. Pt did not feel up to completing typical ADLs during this session due to not feeling well. Due to recent hypotensive episodes and current NPO status, assessed BP; However, BP readings did not demo orthostatic readings. Reinforced safety techniques if experiencing dizziness and reinforced completion of UE HEP with pt verbalizing agreement. Continue to recommend pt to DC home with [facility] services. Pt is eager to return home. Will continue to follow acutely.   Follow Up Recommendations  Home health OT;Supervision - Intermittent    Equipment Recommendations  Tub/shower seat    Recommendations for Other Services      Precautions / Restrictions Precautions Precautions: Fall;Other (comment) Precaution Comments: R charcot foot deformity w/ boot pt wears at baseline (currently top strap is broken but able to be used by wrapping it around and securing) Restrictions Weight Bearing Restrictions: No       Mobility Bed Mobility               General bed mobility comments: pt up in recliner  Transfers Overall transfer level: Needs assistance Equipment used: None Transfers: Sit to/from Stand Sit to Stand: Modified independent (Device/Increase time)              Balance                                            ADL either performed or assessed with clinical judgement   ADL                                               Vision       Perception     Praxis      Cognition Arousal/Alertness: Awake/alert Behavior During Therapy: WFL for tasks assessed/performed Overall Cognitive Status: Within Functional Limits for tasks assessed                                 General Comments: Pt frustrated with changes in DC plans and scheduling, but pleasant with therapy        Exercises Exercises: Other exercises Other Exercises Other Exercises: Reinforced UE HEP with theraband with pt agreeable to recommendations   Shoulder Instructions       General Comments Pt with recent hypotensive episodes. Due to NPO, assessed BP. Also due to NPO, pt did not feel up to doing typical ADL activities. BP sitting in recliner at 108/79. After standing for 2 min, BP at 110/65    Pertinent Vitals/ Pain       Pain Assessment: No/denies pain  Home Living  Prior Functioning/Environment              Frequency  Min 2X/week        Progress Toward Goals  OT Goals(current goals can now be found in the care plan section)  Progress towards OT goals: Progressing toward goals  Acute Rehab OT Goals Patient Stated Goal: to go home OT Goal Formulation: With patient Time For Goal Achievement: 01/27/20 Potential to Achieve Goals: Good ADL Goals Pt Will Perform Grooming: with modified independence;standing Pt Will Perform Lower Body Bathing: with modified independence;sit to/from stand Pt Will Perform Lower Body Dressing: with modified independence;sit to/from stand Pt Will Transfer to Toilet: with modified independence;ambulating Pt Will Perform Toileting - Clothing Manipulation and hygiene: with modified independence;sit to/from stand  Plan Discharge plan remains appropriate     Co-evaluation                 AM-PAC OT "6 Clicks" Daily Activity     Outcome Measure   Help from another person eating meals?: None Help from another person taking care of personal grooming?: A Little Help from another person toileting, which includes using toliet, bedpan, or urinal?: A Little Help from another person bathing (including washing, rinsing, drying)?: A Little Help from another person to put on and taking off regular upper body clothing?: None Help from another person to put on and taking off regular lower body clothing?: A Little 6 Click Score: 20    End of Session Equipment Utilized During Treatment: Other (comment)(none)  OT Visit Diagnosis: Other abnormalities of gait and mobility (R26.89);Unsteadiness on feet (R26.81)   Activity Tolerance Other (comment)(Limited by NPO, general not feeling well)   Patient Left in chair;with call bell/phone within reach   Nurse Communication Other (comment)(Scheduling of procedure)        Time: 6546-5035 OT Time Calculation (min): 12 min  Charges: OT General Charges $OT Visit: 1 Visit OT Treatments $Therapeutic Activity: 8-22 mins  Layla Maw, OTR/L   Layla Maw 01/18/2020, 1:22 PM

## 2020-01-18 NOTE — Progress Notes (Signed)
PT Cancellation Note  Patient Details Name: [name] MRN: [id number] DOB: [birth year]   Cancelled Treatment:    Reason Eval/Treat Not Completed: Patient declined, no reason specified. Pt seated in recliner on entry. Pt reports "not being in the mood today and he doesn't want to talk to anyone." He is tearful when trying to empathize and understand pt. Pt reports "people don't think I can comprehend what they tell me. I haven't eaten or drank anything since last night." Pt unwilling to participate in therapy at this time. RN notified of pt's complaints and status.   Jodelle Green, PT, DPT Acute Rehabilitation Services Office [phone]   Jodelle Green 01/18/2020, 9:38 AM

## 2020-01-18 NOTE — Procedures (Signed)
  Procedure: L tunneled IJ HD catheter placement   EBL:   minimal Complications:  none immediate  See full dictation in [facility].  Dillard Cannon MD Main # [phone] Pager  [phone]

## 2020-01-18 NOTE — Progress Notes (Signed)
PROGRESS NOTE                                                                                                                                                                                                             Patient Demographics:    [name], is a 64 y.o. male, DOB - [birth year], BMS:[id number]  Outpatient Primary MD for the patient is [name], MD    LOS - 7  Admit date - 01/11/2020    Chief Complaint  Patient presents with   Emesis   Shortness of Breath   Sore Throat       Brief Narrative  [name] is a 64 y.o. male with medical history significant for  HTN, OSA, chronic Hep C, Type 2 diabetes and ESRD on HD MW/F who presents with concerns of "not feeling well."  His work-up suggested pneumonia with chest x-ray showing right upper lobe mass suspicious for malignancy.  He also had COVID-19 infection and was admitted to the hospital.   Subjective:   Patient in bed, appears comfortable, denies any headache, no fever, no chest pain or pressure, no shortness of breath , no abdominal pain. No focal weakness.    Assessment  & Plan :     1. Acute Hypoxic Resp. Failure, positive COVID-19 infection and possible mild pneumonia, also newly diagnosed right upper lobe mass suspicious for postobstructive bacterial pneumonia VS CAP with Strep pneumonia bacteremia -   I think his main issue is sepsis from right upper lobe bacterial pneumonia with strep bacteremia, he was extremely toxic and sick, procalcitonin and CRP were both elevated, he has finished his Covid specific treatment and now being treated for bacterial infection and bacterial pneumonia, he was bacteremic with strep pneumonae hence we are removing his dialysis catheter. ID on board, his old HD catheter has been removed after dialysis on 01/16/2020, he has received 1 day of line holiday and will get new tunneled HD catheter on 01/18/2019.  Repeat blood  cultures drawn on 01/14/2020 remain negative.  Procalcitonin trending down.  Repeat blood cultures are negative these were drawn on 01/14/2020.  Encouraged the patient to sit up in chair in the daytime use I-S and flutter valve for pulmonary toiletry and then prone in bed when at night.   SpO2: 95 % O2 Flow Rate (L/min): 2 L/min  Recent Labs  Lab 01/11/20 1610 01/11/20 1615 01/11/20 1756 01/13/20 6837 01/13/20 2902 01/14/20 0238 01/15/20 0730 01/15/20 1501 01/16/20 0311 01/17/20 0310 01/18/20 0816  CRP 34.4*  --    < > 32.0*   < > 20.2* 12.4*  --  8.2* 5.5* 4.2*  DDIMER 10.25*  --    < > 6.32*  --  4.54* 2.75*  --  2.47* 2.51*  --   FERRITIN 1,284*  --   --   --   --   --   --   --   --   --   --   BNP 103.8*  --    < > 307.3*  --  476.9* 247.4*  --  154.1* 71.6  --   PROCALCITON  --   --    < > 28.00  --  25.99  --  13.67 9.22 6.57  --   SARSCOV2NAA  --  POSITIVE*  --   --   --   --   --   --   --   --   --    < > = values in this interval not displayed.    Hepatic Function Latest Ref Rng & Units 01/18/2020 01/17/2020 01/16/2020  Total Protein 6.5 - 8.1 g/dL 6.9 6.7 6.6  Albumin 3.5 - 5.0 g/dL 2.8(L) 2.6(L) 2.4(L)  AST 15 - 41 U/L 13(L) 13(L) 9(L)  ALT 0 - 44 U/L '11 13 10  '$ Alk Phosphatase 38 - 126 U/L 78 98 98  Total Bilirubin 0.3 - 1.2 mg/dL 0.4 0.5 0.6  Bilirubin, Direct 0.0 - 0.2 mg/dL - - -    2.  Right upper lobe mass, history of right sided nephrectomy due to renal cell cancer in the past, history of smoking .  New diagnosis.  Her on CT more suspicious of infiltrate hence complete antibiotic course and then repeat imaging in 2 to 3 weeks by PCP if question persists outpatient pulmonary follow-up.  3.  ESRD, Monday Wednesday Friday schedule. Nephrology consulted.  4.  Anemia of chronic disease.  Hemoglobin close to 8.  Will monitor.  No need for transfusion.  5.  Hyponatremia and hypernatremia.  Management per nephrology and HD.  6.  OSA.  Supplemental oxygen at night,  does not use CPAP.  7.  Dyslipidemia.  On statin.  8.  Essential hypertension.  On combination of Norvasc, Coreg and Lasix continue.  Still makes some urine.  9.  Morbid obesity.  BMI 37.  Follow with PCP.  10. Left foot Charcot joint, right lower extremity shallow 1 cm noninfected ulcer present on admission.  Supportive care.   11.  Possible asymptomatic diverticulitis noted on CT scan.  Add Flagyl to Rocephin and monitor.  Symptom-free.   12.  DM type II.  SSI for now.  Poor outpatient glycemic control due to hyperglycemia, A1c of 10.5.  Diabetic and insulin education will be provided.  Lab Results  Component Value Date   HGBA1C 10.5 (H) 08/08/2018   CBG (last 3)  Recent Labs    01/17/20 1635 01/17/20 2104 01/18/20 0807  GLUCAP 157* 129* 213*       Condition - Extremely Guarded  Family Communication  : Wife updated 01/13/2020, 01/14/20, 01/17/20  Code Status : Full code  Diet :    Diet Order            Diet NPO time specified  Diet effective midnight  Disposition Plan  : Stay in the hospital, is septic with bacterial infection likely postobstructive pneumonia.  Needs HD catheter swap, old HD catheter removed on 01/16/2020 he got 1 day line holiday and will get a new HD tunneled catheter placed on 01/18/2020, will get dialyzed this evening thereafter if he remains stable and no signs of continued bacteremia will be discharged 1 to 2 days later preferably to home.  Consults  : Pulmonary critical care , ID, Renal  Procedures  :    CT chest, abdomen and pelvis noncontrast   -   1. Right greater than left posterior upper lobe airspace and ground-glass opacity. Most consistent with pneumonia. This is not atypical appearance of COVID-19 pneumonia, which cannot be excluded. Other infectious etiologies should also be considered. 2. Aortic atherosclerosis (ICD10-I70.0), coronary artery atherosclerosis and emphysema (ICD10-J43.9). 3. Mild thoracic adenopathy, likely  reactive. 4. Pulmonary artery enlargement suggests pulmonary arterial hypertension.   CT ABDOMEN AND PELVIS IMPRESSION 1. Sigmoid wall thickening and pericolonic edema, likely related to non complicated diverticulitis. Separate edema posterior to the ascending colon is nonspecific and could represent an area of colitis or mild diverticulitis. 2. Hepatomegaly. Hepatic morphology, highly suspicious for cirrhosis.   PUD Prophylaxis : PPI  DVT Prophylaxis  :   Heparin   Lab Results  Component Value Date   PLT 219 01/18/2020    Inpatient Medications  Scheduled Meds:  allopurinol  100 mg Oral BID   amLODipine  5 mg Oral Daily   amoxicillin-clavulanate  1 tablet Oral BID   carvedilol  6.25 mg Oral BID WC   Chlorhexidine Gluconate Cloth  6 each Topical Daily   Chlorhexidine Gluconate Cloth  6 each Topical Q0600   Chlorhexidine Gluconate Cloth  6 each Topical Q0600   cinacalcet  90 mg Oral Q M,W,F   diphenhydrAMINE  25 mg Oral Q M,W,F   ferric citrate  630 mg Oral BID WC   furosemide  40 mg Oral Daily   insulin aspart  0-15 Units Subcutaneous TID WC   pantoprazole  40 mg Oral Daily   pravastatin  20 mg Oral Daily   Continuous Infusions:  chlorproMAZINE (THORAZINE) IV 25 mg (01/12/20 1949)   PRN Meds:.acetaminophen, chlorproMAZINE (THORAZINE) IV, heparin, mirtazapine, ondansetron  Antibiotics  :    Anti-infectives (From admission, onward)   Start     Dose/Rate Route Frequency Ordered Stop   01/16/20 1800  amoxicillin-clavulanate (AUGMENTIN) 500-125 MG per tablet 500 mg  Status:  Discontinued     1 tablet Oral Every 24 hours 01/16/20 1248 01/16/20 1259   01/16/20 1300  amoxicillin-clavulanate (AUGMENTIN) 500-125 MG per tablet 500 mg     1 tablet Oral 2 times daily 01/16/20 1259     01/16/20 0800  cefTRIAXone (ROCEPHIN) 2 g in sodium chloride 0.9 % 100 mL IVPB  Status:  Discontinued     2 g 200 mL/hr over 30 Minutes Intravenous Every 24 hours 01/15/20 1451 01/16/20  1248   01/15/20 1500  cefTRIAXone (ROCEPHIN) 1 g in sodium chloride 0.9 % 100 mL IVPB     1 g 200 mL/hr over 30 Minutes Intravenous  Once 01/15/20 1451 01/15/20 1635   01/14/20 1400  metroNIDAZOLE (FLAGYL) tablet 500 mg  Status:  Discontinued    Note to Pharmacy: Pharmacy can adjust for ESRD and diverticulitis   500 mg Oral Every 8 hours 01/14/20 1126 01/16/20 1248   01/14/20 0800  cefTRIAXone (ROCEPHIN) 1 g in sodium chloride 0.9 % 100 mL IVPB  Status:  Discontinued    Note to Pharmacy: Pharmacy can adjust for ESRD, strep pneumonaue pneumonia   1 g 200 mL/hr over 30 Minutes Intravenous Every 24 hours 01/14/20 0751 01/15/20 1451   01/13/20 1615  vancomycin (VANCOCIN) IVPB 1000 mg/200 mL premix     1,000 mg 200 mL/hr over 60 Minutes Intravenous  Once 01/13/20 1606 01/13/20 1806   01/12/20 1800  ceFEPIme (MAXIPIME) 1 g in sodium chloride 0.9 % 100 mL IVPB  Status:  Discontinued     1 g 200 mL/hr over 30 Minutes Intravenous Every 24 hours 01/12/20 1348 01/14/20 0750   01/12/20 1409  vancomycin variable dose per unstable renal function (pharmacist dosing)  Status:  Discontinued      Does not apply See admin instructions 01/12/20 1409 01/14/20 0750   01/12/20 1000  remdesivir 100 mg in sodium chloride 0.9 % 100 mL IVPB     100 mg 200 mL/hr over 30 Minutes Intravenous Daily 01/11/20 1943 01/15/20 1159   01/12/20 0100  cefTRIAXone (ROCEPHIN) 1 g in sodium chloride 0.9 % 100 mL IVPB  Status:  Discontinued     1 g 200 mL/hr over 30 Minutes Intravenous Daily at bedtime 01/11/20 1903 01/12/20 1204   01/11/20 2030  remdesivir 100 mg in sodium chloride 0.9 % 100 mL IVPB  Status:  Discontinued     100 mg 200 mL/hr over 30 Minutes Intravenous Every 1 hr x 2 01/11/20 1942 01/11/20 1944   01/11/20 2000  azithromycin (ZITHROMAX) 500 mg in sodium chloride 0.9 % 250 mL IVPB  Status:  Discontinued     500 mg 250 mL/hr over 60 Minutes Intravenous Every 24 hours 01/11/20 1903 01/12/20 1423   01/11/20 2000   remdesivir 200 mg in sodium chloride 0.9% 250 mL IVPB     200 mg 580 mL/hr over 30 Minutes Intravenous Once 01/11/20 1945 01/11/20 2130   01/11/20 1730  vancomycin (VANCOCIN) 2,500 mg in sodium chloride 0.9 % 500 mL IVPB     2,500 mg 250 mL/hr over 120 Minutes Intravenous  Once 01/11/20 1657 01/11/20 2005   01/11/20 1715  ceFEPIme (MAXIPIME) 1 g in sodium chloride 0.9 % 100 mL IVPB     1 g 200 mL/hr over 30 Minutes Intravenous  Once 01/11/20 1652 01/11/20 1806       Time Spent in minutes  30   Lala Lund M.D on 01/18/2020 at 11:27 AM  To page go to [email or web address] - password [facility]  [facility] -  Office  [phone]  See all Orders from today for further details    Objective:   Vitals:   01/17/20 2105 01/18/20 0509 01/18/20 0517 01/18/20 1017  BP: 97/62  (!) 95/55 97/84  Pulse: 62  80   Resp:   18   Temp:   97.8 F (36.6 C)   TempSrc:   Oral   SpO2:   95%   Weight:  132.8 kg    Height:        Wt Readings from Last 3 Encounters:  01/18/20 132.8 kg  01/11/20 128 kg  12/25/19 134.7 kg    No intake or output data in the 24 hours ending 01/18/20 1127   Physical Exam  Awake Alert, No new F.N deficits, Normal affect [place].AT,PERRAL Supple Neck,No JVD, No cervical lymphadenopathy appriciated.  Symmetrical Chest wall movement, Good air movement bilaterally, CTAB RRR,No Gallops, Rubs or new Murmurs, No Parasternal Heave +ve B.Sounds, Abd Soft, No tenderness, No organomegaly appriciated, No rebound -  guarding or rigidity. L foot under UNNA boot for charcot's joint, small shallow noninfected right lower extremity lateral 1 cm in diameter ulcer, right subclavian HD catheter removed     Data Review:    CBC Recent Labs  Lab 01/14/20 0238 01/15/20 0730 01/16/20 0311 01/17/20 0310 01/18/20 0816  WBC 25.8* 22.1* 21.4* 23.9* 26.3*  HGB 9.1* 10.4* 10.4* 10.6* 11.1*  HCT 29.2* 33.7* 34.1* 34.9* 36.0*  PLT 297 286 277 257 219  MCV 74.3* 76.1* 75.8* 75.5* 76.1*   MCH 23.2* 23.5* 23.1* 22.9* 23.5*  MCHC 31.2 30.9 30.5 30.4 30.8  RDW 17.4* 18.1* 17.8* 18.1* 18.9*  LYMPHSABS 0.7 1.3 0.9 2.3 0.8  MONOABS 0.9 1.3* 1.1* 1.8* 1.6*  EOSABS 0.0 0.0 0.0 0.1 0.0  BASOSABS 0.1 0.1 0.0 0.0 0.0    Chemistries  Recent Labs  Lab 01/14/20 0238 01/15/20 0730 01/16/20 0311 01/17/20 0310 01/18/20 0816  NA 135 136 132* 135 136  K 4.5 4.1 3.9 3.4* 4.6  CL 98 98 97* 99 100  CO2 20* 20* 18* 17* 15*  GLUCOSE 230* 237* 319* 262* 209*  BUN 61* 60* 77* 59* 82*  CREATININE 13.74* 11.27* 12.49* 10.94* 14.26*  CALCIUM 8.8* 8.4* 8.9 8.4* 9.0  MG 2.0 2.0 1.9 2.0 2.1  AST 11* 14* 9* 13* 13*  ALT '11 11 10 13 11  '$ ALKPHOS 75 102 98 98 78  BILITOT 0.7 0.5 0.6 0.5 0.4   ------------------------------------------------------------------------------------------------------------------ No results for input(s): CHOL, HDL, LDLCALC, TRIG, CHOLHDL, LDLDIRECT in the last 72 hours.  Lab Results  Component Value Date   HGBA1C 10.5 (H) 08/08/2018   ------------------------------------------------------------------------------------------------------------------ No results for input(s): TSH, T4TOTAL, T3FREE, THYROIDAB in the last 72 hours.  Invalid input(s): FREET3  Cardiac Enzymes No results for input(s): CKMB, TROPONINI, MYOGLOBIN in the last 168 hours.  Invalid input(s): CK ------------------------------------------------------------------------------------------------------------------    Component Value Date/Time   BNP 71.6 01/17/2020 0310    Micro Results Recent Results (from the past 240 hour(s))  Respiratory Panel by RT PCR (Flu A&B, Covid) - Nasopharyngeal Swab     Status: Abnormal   Collection Time: 01/11/20  4:15 PM   Specimen: Nasopharyngeal Swab  Result Value Ref Range Status   SARS Coronavirus 2 by RT PCR POSITIVE (A) NEGATIVE Final    Comment: RESULT CALLED TO, READ BACK BY AND VERIFIED WITH: Kathryne Sharper RN [phone] 01/11/20 JM (NOTE) SARS-CoV-2  target nucleic acids are DETECTED. SARS-CoV-2 RNA is generally detectable in upper respiratory specimens  during the acute phase of infection. Positive results are indicative of the presence of the identified virus, but do not rule out bacterial infection or co-infection with other pathogens not detected by the test. Clinical correlation with patient history and other diagnostic information is necessary to determine patient infection status. The expected result is Negative. Fact Sheet for Patients:  PinkCheek.be Fact Sheet for Healthcare Providers: GravelBags.it This test is not yet approved or cleared by the [place] FDA and  has been authorized for detection and/or diagnosis of SARS-CoV-2 by FDA under an Emergency Use Authorization (EUA).  This EUA will remain in effect (meaning this test can be used) for th e duration of  the COVID-19 declaration under Section 564(b)(1) of the Act, 21 U.S.C. section 360bbb-3(b)(1), unless the authorization is terminated or revoked sooner.    Influenza A by PCR NEGATIVE NEGATIVE Final   Influenza B by PCR NEGATIVE NEGATIVE Final    Comment: (NOTE) The Xpert Xpress SARS-CoV-2/FLU/RSV assay is intended as an  aid in  the diagnosis of influenza from Nasopharyngeal swab specimens and  should not be used as a sole basis for treatment. Nasal washings and  aspirates are unacceptable for Xpert Xpress SARS-CoV-2/FLU/RSV  testing. Fact Sheet for Patients: PinkCheek.be Fact Sheet for Healthcare Providers: GravelBags.it This test is not yet approved or cleared by the [place] FDA and  has been authorized for detection and/or diagnosis of SARS-CoV-2 by  FDA under an Emergency Use Authorization (EUA). This EUA will remain  in effect (meaning this test can be used) for the duration of the  Covid-19 declaration under Section 564(b)(1) of  the Act, 21  U.S.C. section 360bbb-3(b)(1), unless the authorization is  terminated or revoked. Performed at [facility], [place] [address], [place], [place] [address]   Culture, blood (routine x 2)     Status: Abnormal   Collection Time: 01/11/20  4:52 PM   Specimen: BLOOD  Result Value Ref Range Status   Specimen Description   Final    BLOOD RIGHT ANTECUBITAL Performed at [facility] [address], [place], [place] [address]    Special Requests   Final    BOTTLES DRAWN AEROBIC AND ANAEROBIC Blood Culture adequate volume Performed at [facility] [address] [place], [place] [address]    Culture  Setup Time   Final    GRAM POSITIVE COCCI IN CHAINS AEROBIC BOTTLE ONLY CRITICAL RESULT CALLED TO, READ BACK BY AND VERIFIED WITH: PHARMD L CHEN [id number] AT 1011 BY CM Performed at [facility], [place] [address] [place], [place] [address]    Culture STREPTOCOCCUS PNEUMONIAE (A)  Final   Report Status 01/14/2020 FINAL  Final   Organism ID, Bacteria STREPTOCOCCUS PNEUMONIAE  Final      Susceptibility   Streptococcus pneumoniae - MIC*    ERYTHROMYCIN <=0.12 SENSITIVE Sensitive     LEVOFLOXACIN 0.5 SENSITIVE Sensitive     VANCOMYCIN 0.5 SENSITIVE Sensitive     PENICILLIN (meningitis) 0.12 RESISTANT Resistant     PENO - penicillin 0.12      PENICILLIN (non-meningitis) 0.12 SENSITIVE Sensitive     PENICILLIN (oral) 0.12 INTERMEDIATE Intermediate     CEFTRIAXONE (non-meningitis) <=0.12 SENSITIVE Sensitive     CEFTRIAXONE (meningitis) <=0.12 SENSITIVE Sensitive     * STREPTOCOCCUS PNEUMONIAE  Culture, blood (routine x 2)     Status: None   Collection Time: 01/11/20  5:46 PM   Specimen: BLOOD LEFT HAND  Result Value Ref Range Status   Specimen Description   Final    BLOOD LEFT HAND Performed at [facility] [address] [place], [place] [address]    Special Requests   Final    BOTTLES DRAWN  AEROBIC ONLY Blood Culture adequate volume Performed at [facility] [address] [place], [place] [address]    Culture   Final    NO GROWTH 5 DAYS Performed at [facility], [place] [address] [place], [place] [address]    Report Status 01/16/2020 FINAL  Final  MRSA PCR Screening     Status: None   Collection Time: 01/12/20  2:28 AM   Specimen: Nasal Mucosa; Nasopharyngeal  Result Value Ref Range Status   MRSA by PCR NEGATIVE NEGATIVE Final    Comment:        The GeneXpert MRSA Assay (FDA approved for NASAL specimens only), is one component of a comprehensive MRSA colonization surveillance program. It is not intended to diagnose  MRSA infection nor to guide or monitor treatment for MRSA infections. Performed at [facility], [place] [address], [place], [place] [address]   Culture, blood (routine x 2)     Status: None (Preliminary result)   Collection Time: 01/14/20 12:10 PM   Specimen: BLOOD  Result Value Ref Range Status   Specimen Description BLOOD LEFT ANTECUBITAL  Final   Special Requests   Final    BOTTLES DRAWN AEROBIC ONLY Blood Culture results may not be optimal due to an inadequate volume of blood received in culture bottles   Culture   Final    NO GROWTH 3 DAYS Performed at [facility], [place] [address], [place], [place] [address]    Report Status PENDING  Incomplete  Culture, blood (routine x 2)     Status: None (Preliminary result)   Collection Time: 01/14/20 12:13 PM   Specimen: BLOOD LEFT HAND  Result Value Ref Range Status   Specimen Description BLOOD LEFT HAND  Final   Special Requests   Final    BOTTLES DRAWN AEROBIC ONLY Blood Culture results may not be optimal due to an inadequate volume of blood received in culture bottles   Culture   Final    NO GROWTH 3 DAYS Performed at [facility], [place] [address] [place], [place] [address]    Report Status PENDING  Incomplete    Radiology Reports CT ABDOMEN PELVIS WO  CONTRAST  Result Date: 01/13/2020 CLINICAL DATA:  Follow-up of COVID pneumonia. Lung nodule on chest radiograph. Diffuse abdominal pain. EXAM: CT CHEST, ABDOMEN AND PELVIS WITHOUT CONTRAST TECHNIQUE: Multidetector CT imaging of the chest, abdomen and pelvis was performed following the standard protocol without IV contrast. COMPARISON:  Chest radiograph 01/12/2020. CT of the abdomen and pelvis of 12/05/2018. CTA of the chest of 03/04/2017. FINDINGS: CT CHEST FINDINGS Cardiovascular: A left sided central line terminates in the right atrium. Aortic atherosclerosis. Tortuous thoracic aorta. Moderate cardiomegaly, without pericardial effusion. Multivessel coronary artery atherosclerosis. Pulmonary artery enlargement, outflow tract 3.6 cm. 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 Mediastinum/Nodes: Right paratracheal node of 1.1 cm Hilar regions poorly evaluated without intravenous contrast. Lungs/Pleura: No pleural fluid.  Mild centrilobular emphysema. Right greater than left peripheral upper lobe airspace disease with mild surrounding ground-glass. Musculoskeletal: No acute osseous abnormality. CT ABDOMEN PELVIS FINDINGS Hepatobiliary: Hepatomegaly at 19.2 cm craniocaudal. Hepatic morphology, including caudate lobe enlargement and irregular hepatic capsule, highly suspicious for mild cirrhosis. Normal gallbladder, without biliary ductal dilatation. Pancreas: Normal, without mass or ductal dilatation. Spleen: Normal in size, without focal abnormality. Adrenals/Urinary Tract: Mild bilateral adrenal thickening. Native renal atrophy on the left. Right nephrectomy. Multiple low-density left renal lesions are likely cysts. No bladder calculi. Stomach/Bowel: Normal stomach, without wall thickening. Scattered colonic diverticula. Wall thickening within the sigmoid with mild surrounding edema, including on 95/3. No pericolonic fluid collection or free perforation. Separate area of pericolonic  edema posterior to the ascending colon including on 84/3. Vascular/Lymphatic: Aortic and branch vessel atherosclerosis. Reproductive: No abdominopelvic adenopathy.  Normal prostate. Other: Fat containing left inguinal hernia. No significant free fluid. Tiny periumbilical fat containing ventral abdominal wall laxity. Musculoskeletal: Renal osteodystrophy.  Lumbosacral spondylosis. IMPRESSION: CT CHEST IMPRESSION 1. Right greater than left posterior upper lobe airspace and ground-glass opacity. Most consistent with pneumonia. This is not atypical appearance of COVID-19 pneumonia, which cannot be excluded. Other infectious etiologies should also be considered. 2. Aortic atherosclerosis (ICD10-I70.0), coronary artery atherosclerosis and emphysema (ICD10-J43.9). 3. Mild  thoracic adenopathy, likely reactive. 4. Pulmonary artery enlargement suggests pulmonary arterial hypertension. CT ABDOMEN AND PELVIS IMPRESSION 1. Sigmoid wall thickening and pericolonic edema, likely related to non complicated diverticulitis. Separate edema posterior to the ascending colon is nonspecific and could represent an area of colitis or mild diverticulitis. 2. Hepatomegaly. Hepatic morphology, highly suspicious for cirrhosis. Electronically Signed   By: Abigail Miyamoto M.D.   On: 01/13/2020 12:36   CT CHEST WO CONTRAST  Result Date: 01/13/2020 CLINICAL DATA:  Follow-up of COVID pneumonia. Lung nodule on chest radiograph. Diffuse abdominal pain. EXAM: CT CHEST, ABDOMEN AND PELVIS WITHOUT CONTRAST TECHNIQUE: Multidetector CT imaging of the chest, abdomen and pelvis was performed following the standard protocol without IV contrast. COMPARISON:  Chest radiograph 01/12/2020. CT of the abdomen and pelvis of 12/05/2018. CTA of the chest of 03/04/2017. FINDINGS: CT CHEST FINDINGS Cardiovascular: A left sided central line terminates in the right atrium. Aortic atherosclerosis. Tortuous thoracic aorta. Moderate cardiomegaly, without pericardial  effusion. Multivessel coronary artery atherosclerosis. Pulmonary artery enlargement, outflow tract 3.6 cm. 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 Mediastinum/Nodes: Right paratracheal node of 1.1 cm Hilar regions poorly evaluated without intravenous contrast. Lungs/Pleura: No pleural fluid.  Mild centrilobular emphysema. Right greater than left peripheral upper lobe airspace disease with mild surrounding ground-glass. Musculoskeletal: No acute osseous abnormality. CT ABDOMEN PELVIS FINDINGS Hepatobiliary: Hepatomegaly at 19.2 cm craniocaudal. Hepatic morphology, including caudate lobe enlargement and irregular hepatic capsule, highly suspicious for mild cirrhosis. Normal gallbladder, without biliary ductal dilatation. Pancreas: Normal, without mass or ductal dilatation. Spleen: Normal in size, without focal abnormality. Adrenals/Urinary Tract: Mild bilateral adrenal thickening. Native renal atrophy on the left. Right nephrectomy. Multiple low-density left renal lesions are likely cysts. No bladder calculi. Stomach/Bowel: Normal stomach, without wall thickening. Scattered colonic diverticula. Wall thickening within the sigmoid with mild surrounding edema, including on 95/3. No pericolonic fluid collection or free perforation. Separate area of pericolonic edema posterior to the ascending colon including on 84/3. Vascular/Lymphatic: Aortic and branch vessel atherosclerosis. Reproductive: No abdominopelvic adenopathy.  Normal prostate. Other: Fat containing left inguinal hernia. No significant free fluid. Tiny periumbilical fat containing ventral abdominal wall laxity. Musculoskeletal: Renal osteodystrophy.  Lumbosacral spondylosis. IMPRESSION: CT CHEST IMPRESSION 1. Right greater than left posterior upper lobe airspace and ground-glass opacity. Most consistent with pneumonia. This is not atypical appearance of COVID-19 pneumonia, which cannot be excluded. Other infectious etiologies  should also be considered. 2. Aortic atherosclerosis (ICD10-I70.0), coronary artery atherosclerosis and emphysema (ICD10-J43.9). 3. Mild thoracic adenopathy, likely reactive. 4. Pulmonary artery enlargement suggests pulmonary arterial hypertension. CT ABDOMEN AND PELVIS IMPRESSION 1. Sigmoid wall thickening and pericolonic edema, likely related to non complicated diverticulitis. Separate edema posterior to the ascending colon is nonspecific and could represent an area of colitis or mild diverticulitis. 2. Hepatomegaly. Hepatic morphology, highly suspicious for cirrhosis. Electronically Signed   By: Abigail Miyamoto M.D.   On: 01/13/2020 12:36   IR Removal Tun Cv Cath W/O FL  Result Date: 01/16/2020 INDICATION: Streptococcus pneumoniae bacteremia. Request for removal of tunneled hemodialysis catheter. Initially placed at an outside facility. EXAM: REMOVAL OF TUNNELED HEMODIALYSIS CATHETER MEDICATIONS: 1% lidocaine with epinephrine 4 mL COMPLICATIONS: None immediate. PROCEDURE: Informed written consent was obtained from the patient following an explanation of the procedure, risks, benefits and alternatives to treatment. A time out was performed prior to the initiation of the procedure. Maximal barrier sterile technique was utilized including caps, mask, sterile gowns, sterile gloves, large sterile drape, hand hygiene, and Betadine. 1% lidocaine  with epinephrine was injected under sterile conditions along the subcutaneous tunnel. Utilizing a combination of blunt dissection and gentle traction, the catheter was removed intact. Hemostasis was obtained with manual compression. A dressing was placed. The patient tolerated the procedure well without immediate post procedural complication. IMPRESSION: Successful removal of tunneled dialysis catheter. Read by: Gareth Eagle, PA-C Electronically Signed   By: Jacqulynn Cadet M.D.   On: 01/16/2020 14:54   DG Chest Port 1 View  Result Date: 01/14/2020 CLINICAL DATA:  Shortness  of breath, COVID positive EXAM: PORTABLE CHEST 1 VIEW COMPARISON:  01/12/2020 FINDINGS: Left dialysis catheter remains in place, unchanged. Patchy airspace disease in the right upper lobe again noted. Overall, airspace disease throughout the right lung has improved. No confluent opacity on the left. No effusions. No acute bony abnormality. Heart is borderline in size. IMPRESSION: Improving airspace disease with mild residual patchy right upper lobe opacities. Electronically Signed   By: Rolm Baptise M.D.   On: 01/14/2020 11:33   DG Chest Port 1 View  Result Date: 01/12/2020 CLINICAL DATA:  Short of breath EXAM: PORTABLE CHEST 1 VIEW COMPARISON:  01/11/2020 FINDINGS: Right upper lobe airspace disease unchanged. Improved aeration in the lung bases with improved lung volume. Negative for edema or effusion. Central venous catheter tip in the right atrium unchanged. IMPRESSION: Persistent right upper lobe airspace disease unchanged Improved aeration lungs with decrease in bibasilar atelectasis. Electronically Signed   By: Franchot Gallo M.D.   On: 01/12/2020 09:16   DG Chest Port 1 View  Result Date: 01/11/2020 CLINICAL DATA:  Shortness of breath EXAM: PORTABLE CHEST 1 VIEW COMPARISON:  01/10/2019 FINDINGS: There is bilateral diffuse interstitial thickening. There is a nodular appearance along the right suprahilar region likely reflecting prominent pulmonary vasculature or airspace disease. There is no pleural effusion or pneumothorax. The heart mediastinum are stable. There is a large bore left-sided central venous catheter with the tip projecting over the cavoatrial junction. There is no acute osseous abnormality. IMPRESSION: Diffuse bilateral interstitial thickening likely reflecting interstitial edema. Nodular appearance along the right suprahilar region likely reflecting prominent pulmonary vasculature or airspace disease. Electronically Signed   By: Kathreen Devoid   On: 01/11/2020 16:31   DG Chest Port 1  View  Result Date: 01/11/2020 CLINICAL DATA:  Cough, short of breath EXAM: PORTABLE CHEST 1 VIEW COMPARISON:  03/07/2017 FINDINGS: Single frontal view of the chest demonstrates a left internal jugular dialysis catheter tip overlying atrial caval junction. Vascular stent overlies the right axilla. Cardiac silhouette is stable. There is a 7.3 cm mass projecting over the right anterior first and second ribs. CT chest with IV contrast recommended for suspected neoplasm. There is central vascular congestion with diffuse interstitial prominence consistent with mild fluid overload. Right hilar prominence could also reflect lymphadenopathy. No effusion or pneumothorax. No acute bony abnormality. IMPRESSION: 1. 7.3 cm right upper lobe mass consistent with neoplasm. CT chest with IV contrast is recommended. 2. Right hilar prominence could reflect underlying adenopathy. 3. Vascular congestion and interstitial edema. Electronically Signed   By: Randa Ngo M.D.   On: 01/11/2020 04:02   VAS [place] UPPER EXTREMITY ARTERIAL DUPLEX  Result Date: 12/25/2019 UPPER EXTREMITY DUPLEX STUDY Indications: Pre-operative exam.  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 Bilateral testing is considered an integral part of a complete examination. Limited examinations for reoccurring indications may be performed as noted.  Right Pre-Dialysis Findings: +-----------------------+----------+--------------------+---------+--------+  Location                PSV (cm/s) Intralum. Diam. (cm) Waveform  Comments  +-----------------------+----------+--------------------+---------+--------+  Brachial Antecub. fossa 40         0.46                 triphasic            +-----------------------+----------+--------------------+---------+--------+  Radial Art at Wrist     35         0.29                 triphasic           +-----------------------+----------+--------------------+---------+--------+  Ulnar Art at Wrist      81         0.27                 triphasic           +-----------------------+----------+--------------------+---------+--------+ +------------+-----------------+  Allen's Test Branches at fossa  +------------+-----------------+ Left Pre-Dialysis Findings: +-----------------------+----------+--------------------+---------+--------+  Location                PSV (cm/s) Intralum. Diam. (cm) Waveform  Comments  +-----------------------+----------+--------------------+---------+--------+  Brachial Antecub. fossa 54         0.52                 triphasic           +-----------------------+----------+--------------------+---------+--------+  Radial Art at Wrist     44         0.27                 triphasic tortuous  +-----------------------+----------+--------------------+---------+--------+  Ulnar Art at Wrist      73         0.19                 triphasic           +-----------------------+----------+--------------------+---------+--------+ +------------+-----------------+  Allen's Test Branches at fossa  +------------+-----------------+  Summary:   Measurements above. *See table(s) above for measurements and observations. Electronically signed by Curt Jews MD on 12/25/2019 at 9:30:15 AM.    Final    VAS [place] UPPER EXTREMITY VEIN MAPPING  Result Date: 12/25/2019 UPPER EXTREMITY VEIN MAPPING  Indications: Pre-access. History: History of right Radiocephalic fistula years ago.  Performing Technologist: Alvia Grove RVT  Examination Guidelines: A complete evaluation includes B-mode imaging, spectral Doppler, color Doppler, and power Doppler as needed of all accessible portions of each vessel. Bilateral testing is considered an integral part of a complete examination.  Limited examinations for reoccurring indications may be performed as noted. +-----------------+-------------+----------+--------+  Right Cephalic    Diameter (cm) Depth (cm) Findings  +-----------------+-------------+----------+--------+  Prox upper arm                                [place]     +-----------------+-------------+----------+--------+  Mid upper arm                                 [place]     +-----------------+-------------+----------+--------+  Dist upper arm  [place]     +-----------------+-------------+----------+--------+  Antecubital fossa                             [place]     +-----------------+-------------+----------+--------+ +-----------------+-------------+----------+---------+  Right Basilic     Diameter (cm) Depth (cm) Findings   +-----------------+-------------+----------+---------+  Prox upper arm        0.46                            +-----------------+-------------+----------+---------+  Mid upper arm         0.68                 branching  +-----------------+-------------+----------+---------+  Dist upper arm        0.34                            +-----------------+-------------+----------+---------+  Antecubital fossa     0.31                            +-----------------+-------------+----------+---------+  Prox forearm          0.27                            +-----------------+-------------+----------+---------+ +-----------------+-------------+----------+--------+  Left Cephalic     Diameter (cm) Depth (cm) Findings  +-----------------+-------------+----------+--------+  Shoulder              0.47                           +-----------------+-------------+----------+--------+  Prox upper arm        0.36                           +-----------------+-------------+----------+--------+  Mid upper arm         0.41                           +-----------------+-------------+----------+--------+  Dist upper arm        0.43                            +-----------------+-------------+----------+--------+  Antecubital fossa     0.54                           +-----------------+-------------+----------+--------+  Prox forearm          0.43                           +-----------------+-------------+----------+--------+  Mid forearm           0.38                           +-----------------+-------------+----------+--------+  Dist forearm          0.33                           +-----------------+-------------+----------+--------+ +-----------------+-------------+----------+--------+  Left Basilic      Diameter (cm) Depth (cm) Findings  +-----------------+-------------+----------+--------+  Prox upper  arm        0.48                           +-----------------+-------------+----------+--------+  Mid upper arm         0.51                           +-----------------+-------------+----------+--------+  Dist upper arm        0.47                           +-----------------+-------------+----------+--------+  Antecubital fossa     0.26                           +-----------------+-------------+----------+--------+  Prox forearm          0.25                           +-----------------+-------------+----------+--------+ Summary: Right: Occluded right forearm fistula. Thrombus in the cephalic vein        from the antecubital fossa extending proximally into the        subclavian vein, appearing chronic. Left: Measurements above. *See table(s) above for measurements and observations.  Diagnosing physician: Curt Jews MD Electronically signed by Curt Jews MD on 12/25/2019 at 9:27:41 AM.    Final

## 2020-01-19 LAB — CBC WITH DIFFERENTIAL/PLATELET
Abs Immature Granulocytes: 0 10*3/uL (ref 0.00–0.07)
Basophils Absolute: 0 10*3/uL (ref 0.0–0.1)
Basophils Relative: 0 %
Eosinophils Absolute: 0.2 10*3/uL (ref 0.0–0.5)
Eosinophils Relative: 1 %
HCT: 37 % — ABNORMAL LOW (ref 39.0–52.0)
Hemoglobin: 10.9 g/dL — ABNORMAL LOW (ref 13.0–17.0)
Lymphocytes Relative: 10 %
Lymphs Abs: 1.9 10*3/uL (ref 0.7–4.0)
MCH: 22.8 pg — ABNORMAL LOW (ref 26.0–34.0)
MCHC: 29.5 g/dL — ABNORMAL LOW (ref 30.0–36.0)
MCV: 77.4 fL — ABNORMAL LOW (ref 80.0–100.0)
Monocytes Absolute: 0.6 10*3/uL (ref 0.1–1.0)
Monocytes Relative: 3 %
Neutro Abs: 16.6 10*3/uL — ABNORMAL HIGH (ref 1.7–7.7)
Neutrophils Relative %: 86 %
Platelets: 238 10*3/uL (ref 150–400)
RBC: 4.78 MIL/uL (ref 4.22–5.81)
RDW: 19.6 % — ABNORMAL HIGH (ref 11.5–15.5)
WBC: 19.3 10*3/uL — ABNORMAL HIGH (ref 4.0–10.5)
nRBC: 0.6 % — ABNORMAL HIGH (ref 0.0–0.2)
nRBC: 2 /100 WBC — ABNORMAL HIGH

## 2020-01-19 LAB — CULTURE, BLOOD (ROUTINE X 2)
Culture: NO GROWTH
Culture: NO GROWTH

## 2020-01-19 LAB — COMPREHENSIVE METABOLIC PANEL
ALT: 13 U/L (ref 0–44)
AST: 11 U/L — ABNORMAL LOW (ref 15–41)
Albumin: 2.8 g/dL — ABNORMAL LOW (ref 3.5–5.0)
Alkaline Phosphatase: 78 U/L (ref 38–126)
Anion gap: 20 — ABNORMAL HIGH (ref 5–15)
BUN: 94 mg/dL — ABNORMAL HIGH (ref 8–23)
CO2: 18 mmol/L — ABNORMAL LOW (ref 22–32)
Calcium: 8.5 mg/dL — ABNORMAL LOW (ref 8.9–10.3)
Chloride: 100 mmol/L (ref 98–111)
Creatinine, Ser: 16.56 mg/dL — ABNORMAL HIGH (ref 0.61–1.24)
GFR calc Af Amer: 3 mL/min — ABNORMAL LOW (ref >60)
GFR calc non Af Amer: 3 mL/min — ABNORMAL LOW (ref >60)
Glucose, Bld: 149 mg/dL — ABNORMAL HIGH (ref 70–99)
Potassium: 3.8 mmol/L (ref 3.5–5.1)
Sodium: 138 mmol/L (ref 135–145)
Total Bilirubin: 0.5 mg/dL (ref 0.3–1.2)
Total Protein: 6.8 g/dL (ref 6.5–8.1)

## 2020-01-19 LAB — PROCALCITONIN: Procalcitonin: 3.65 ng/mL

## 2020-01-19 LAB — BRAIN NATRIURETIC PEPTIDE: B Natriuretic Peptide: 41.1 pg/mL (ref 0.0–100.0)

## 2020-01-19 LAB — MAGNESIUM: Magnesium: 2.3 mg/dL (ref 1.7–2.4)

## 2020-01-19 LAB — GLUCOSE, CAPILLARY
Glucose-Capillary: 147 mg/dL — ABNORMAL HIGH (ref 70–99)
Glucose-Capillary: 176 mg/dL — ABNORMAL HIGH (ref 70–99)
Glucose-Capillary: 171 mg/dL — ABNORMAL HIGH (ref 70–99)

## 2020-01-19 LAB — D-DIMER, QUANTITATIVE: D-Dimer, Quant: 2.39 ug/mL-FEU — ABNORMAL HIGH (ref 0.00–0.50)

## 2020-01-19 LAB — C-REACTIVE PROTEIN: CRP: 3.5 mg/dL — ABNORMAL HIGH (ref <1.0)

## 2020-01-19 MED ORDER — DARBEPOETIN ALFA 100 MCG/0.5ML IJ SOSY: 100.0000 ug | PREFILLED_SYRINGE | INTRAMUSCULAR | Status: DC

## 2020-01-19 MED ORDER — HEPARIN SODIUM (PORCINE) 1000 UNIT/ML IJ SOLN
INTRAMUSCULAR | Status: AC
  Filled 2020-01-19: qty 9

## 2020-01-19 MED ORDER — AMOXICILLIN-POT CLAVULANATE 500-125 MG PO TABS
1.0000 | ORAL_TABLET | Freq: Two times a day (BID) | ORAL | Status: DC
  Administered 2020-01-19 – 2020-01-21 (×4): 500 mg via ORAL
  Filled 2020-01-19 (×4): qty 1

## 2020-01-19 MED ORDER — ALTEPLASE 2 MG IJ SOLR
INTRAMUSCULAR | Status: AC
  Administered 2020-01-19: 4 mg
  Filled 2020-01-19: qty 4

## 2020-01-19 NOTE — Progress Notes (Addendum)
[facility] NEPHROLOGY PROGRESS NOTE  Assessment/ Plan: Pt is a 64 y.o. yo male    Outpt HD:MWF [facility], 5h 550/800 132kg 2/2 bath P4 AVG Hep 10K+ 2K midrunCalcitriol 3.25 ug tiw,sensipar 180 qhd,venofer 50 /wk,mirc 60 q 2, last 2/22  #COVID-19 pneumonia/acute hypoxic respiratory failure: On remdesivir, antibiotics per primary team.    # Streptococcal bacteremia: The HD catheter was exchanged and placed new left IJ TDC by IR on 3/5.  Cultures are negative.  Currently on Augmentin.  # ESRD MWF schedule: Problem with venous blood flow in new left IJ TDC.  TPA was instilled in the catheter and plan for dialysis at [facility] this afternoon.  Now changing to TTS schedule because of OP HD units has TTS schedule for covid patients.  # Anemia of CKD:  Hb at goal.  Monitor hemoglobin.  Received Mircera on 2/22. Dose Aranesp today.  # Secondary hyperparathyroidism: phos high therefore increased auryxia dose. On Sensipar.  Monitor Ca, phos level.   # HTN/volume: Continue current antihypertensive medication and the UF during HD.  Discussed with the primary team.   Subjective: Chart and lab results reviewed.  He was not able to receive dialysis because of poor venous blood flow and was returned to the floor.  No chest pain or shortness of breath.   Objective Vital signs in last 24 hours: Vitals:   01/19/20 0843 01/19/20 0900 01/19/20 0930 01/19/20 1000  BP: 135/62 132/60 102/60 92/63  Pulse: 74 72 68 66  Resp:      Temp:      TempSrc:      SpO2:      Weight:      Height:       Weight change:   Intake/Output Summary (Last 24 hours) at 01/19/2020 1046 Last data filed at 01/19/2020 1015 Gross per 24 hour  Intake 0 ml  Output -157 ml  Net 157 ml       Labs: Basic Metabolic Panel: Recent Labs  Lab 01/16/20 0311 01/16/20 0311 01/17/20 0310 01/17/20 1048 01/18/20 0816 01/19/20 0446  NA 132*   < > 135  --  136 138  K 3.9   < > 3.4*  --  4.6 3.8  CL 97*   < > 99   --  100 100  CO2 18*   < > 17*  --  15* 18*  GLUCOSE 319*   < > 262*  --  209* 149*  BUN 77*   < > 59*  --  82* 94*  CREATININE 12.49*   < > 10.94*  --  14.26* 16.56*  CALCIUM 8.9   < > 8.4*  --  9.0 8.5*  PHOS 6.6*  --   --  5.0*  --   --    < > = values in this interval not displayed.   Liver Function Tests: Recent Labs  Lab 01/17/20 0310 01/18/20 0816 01/19/20 0446  AST 13* 13* 11*  ALT 13 11 13   ALKPHOS 98 78 78  BILITOT 0.5 0.4 0.5  PROT 6.7 6.9 6.8  ALBUMIN 2.6* 2.8* 2.8*   No results for input(s): LIPASE, AMYLASE in the last 168 hours. No results for input(s): AMMONIA in the last 168 hours. CBC: Recent Labs  Lab 01/15/20 0730 01/15/20 0730 01/16/20 0311 01/16/20 0311 01/17/20 0310 01/18/20 0816 01/19/20 0446  WBC 22.1*   < > 21.4*   < > 23.9* 26.3* 19.3*  NEUTROABS 17.0*   < > 18.8*   < >  16.6* 21.6* 16.6*  HGB 10.4*   < > 10.4*   < > 10.6* 11.1* 10.9*  HCT 33.7*   < > 34.1*   < > 34.9* 36.0* 37.0*  MCV 76.1*  --  75.8*  --  75.5* 76.1* 77.4*  PLT 286   < > 277   < > 257 219 238   < > = values in this interval not displayed.   Cardiac Enzymes: No results for input(s): CKTOTAL, CKMB, CKMBINDEX, TROPONINI in the last 168 hours. CBG: Recent Labs  Lab 01/18/20 0807 01/18/20 1205 01/18/20 1628 01/18/20 2035 01/19/20 0751  GLUCAP 213* 146* 92 113* 147*    Iron Studies:  No results for input(s): IRON, TIBC, TRANSFERRIN, FERRITIN in the last 72 hours. Studies/Results: No results found.  Medications: Infusions: . chlorproMAZINE (THORAZINE) IV 25 mg (01/12/20 1949)    Scheduled Medications: . allopurinol  100 mg Oral BID  . amLODipine  5 mg Oral Daily  . amoxicillin-clavulanate  1 tablet Oral Daily  . carvedilol  6.25 mg Oral BID WC  . Chlorhexidine Gluconate Cloth  6 each Topical Q0600  . cinacalcet  90 mg Oral Q M,W,F  . diphenhydrAMINE  25 mg Oral Q M,W,F  . ferric citrate  630 mg Oral BID WC  . furosemide  40 mg Oral Daily  . heparin       . insulin aspart  0-15 Units Subcutaneous TID WC  . pantoprazole  40 mg Oral Daily  . pravastatin  20 mg Oral Daily    have reviewed scheduled and prn medications.  Physical Exam: General:NAD, comfortable,  Heart:RRR, s1s2 nl Lungs: Clear bilateral, no wheezing Abdomen:soft, Non-tender, non-distended Extremities: No LE edema Dialysis Access: Left IJ TDC.  Malyia Moro Prasad Andron Marrazzo 01/19/2020,10:46 AM  LOS: 8 days  Pager: [phone]

## 2020-01-19 NOTE — Progress Notes (Signed)
Pt noted with difficulty with access of venous Dialysis cath limb. TPA instilled and patient to be dialyzed later on today per Dr. Carolin Sicks.

## 2020-01-19 NOTE — Progress Notes (Signed)
PHARMACY NOTE:  ANTIMICROBIAL RENAL DOSAGE ADJUSTMENT  Current antimicrobial regimen includes a mismatch between antimicrobial dosage and estimated renal function.  As per policy approved by the Pharmacy & Therapeutics and Medical Executive Committees, the antimicrobial dosage will be adjusted accordingly.  Current antimicrobial dosage:  Augmentin 500-125 mg daily   Indication: strep pneumoniae bacteremia and PNA  Renal Function:  Estimated Creatinine Clearance: 6.4 mL/min (A) (by C-G formula based on SCr of 16.56 mg/dL (H)). [x]      On intermittent HD, scheduled: MWF (HD today) []      On CRRT    Antimicrobial dosage has been changed to:  Augmentin 500-125mg  BID for HD dosing and selecting higher end of dosing range due to bacteremia indication.   Additional comments:   Thank you for allowing pharmacy to be a part of this patient's care.  Cristela Felt, PharmD PGY1 Pharmacy Resident [employer]: [phone]  01/19/2020 2:06 PM

## 2020-01-19 NOTE — Progress Notes (Signed)
PROGRESS NOTE                                                                                                                                                                                                             Patient Demographics:    [name], is a 64 y.o. male, DOB - 1956-06-24, HFG:[id number]  Outpatient Primary MD for the patient is [name], MD    LOS - 8  Admit date - 01/11/2020    Chief Complaint  Patient presents with  . Emesis  . Shortness of Breath  . Sore Throat       Brief Narrative  [name] is a 64 y.o. male with medical history significant for  HTN, OSA, chronic Hep C, Type 2 diabetes and ESRD on HD MW/F who presents with concerns of "not feeling well."  His work-up suggested pneumonia with chest x-ray showing right upper lobe mass suspicious for malignancy.  He also had COVID-19 infection and was admitted to the hospital.   Subjective:   Patient in bed, appears comfortable, denies any headache, no fever, no chest pain or pressure, no shortness of breath , no abdominal pain. No focal weakness.   Assessment  & Plan :     1. Acute Hypoxic Resp. Failure, positive COVID-19 infection and possible mild pneumonia, also newly diagnosed right upper lobe mass suspicious for postobstructive bacterial pneumonia VS CAP with Strep pneumonia bacteremia -   I think his main issue is sepsis from right upper lobe bacterial pneumonia with strep bacteremia, he was extremely toxic and sick, procalcitonin and CRP were both elevated, he has finished his Covid specific treatment and now being treated for bacterial infection and bacterial pneumonia, he was bacteremic with strep pneumonae hence we are removing his dialysis catheter. ID on board, his old HD catheter has been removed after dialysis on 01/16/2020, he has received 1 day of line holiday and received a new left-sided tunneled HD catheter placed by IR on  01/18/2020.  Repeat blood cultures drawn on 01/14/2020 remain negative.  Procalcitonin trending down.  Repeat blood cultures are negative these were drawn on 01/14/2020.  Encouraged the patient to sit up in chair in the daytime use I-S and flutter valve for pulmonary toiletry and then prone in bed when at night.   SpO2: 95 % O2 Flow Rate (  L/min): 2 L/min  Recent Labs  Lab 01/14/20 0238 01/15/20 0730 01/15/20 1501 01/16/20 0311 01/17/20 0310 01/18/20 0816 01/19/20 0446  CRP  --  12.4*  --  8.2* 5.5* 4.2* 3.5*  DDIMER  --  2.75*  --  2.47* 2.51* 2.07* 2.39*  BNP  --  247.4*  --  154.1* 71.6 76.2 41.1  PROCALCITON   < >  --  13.67 9.22 6.57 4.45 3.65   < > = values in this interval not displayed.    Hepatic Function Latest Ref Rng & Units 01/19/2020 01/18/2020 01/17/2020  Total Protein 6.5 - 8.1 g/dL 6.8 6.9 6.7  Albumin 3.5 - 5.0 g/dL 2.8(L) 2.8(L) 2.6(L)  AST 15 - 41 U/L 11(L) 13(L) 13(L)  ALT 0 - 44 U/L _0 Alk Phosphatase 38 - 126 U/L 78 78 98  Total Bilirubin 0.3 - 1.2 mg/dL 0.5 0.4 0.5  Bilirubin, Direct 0.0 - 0.2 mg/dL - - -    2.  Right upper lobe mass, history of right sided nephrectomy due to renal cell cancer in the past, history of smoking .  New diagnosis.  Her on CT more suspicious of infiltrate hence complete antibiotic course and then repeat imaging in 2 to 3 weeks by PCP if question persists outpatient pulmonary follow-up.  3.  ESRD, Monday Wednesday Friday schedule. Nephrology consulted.  Unfortunately new catheter placed on 01/18/2020 seems to have clotted off, BP has been installed, nephrology and IR monitoring.  4.  Anemia of chronic disease.  Hemoglobin close to 8.  Will monitor.  No need for transfusion.  5.  Hyponatremia and hypernatremia.  Management per nephrology and HD.  6.  OSA.  Supplemental oxygen at night, does not use CPAP.  7.  Dyslipidemia.  On statin.  8.  Essential hypertension.  On combination of Norvasc, Coreg and Lasix continue.  Still makes  some urine.  9.  Morbid obesity.  BMI 37.  Follow with PCP.  10. Left foot Charcot joint, right lower extremity shallow 1 cm noninfected ulcer present on admission.  Supportive care.   11.  Possible asymptomatic diverticulitis noted on CT scan.  Add Flagyl to Rocephin and monitor.  Symptom-free.   12.  DM type II.  SSI for now.  Poor outpatient glycemic control due to hyperglycemia, A1c of 10.5.  Diabetic and insulin education will be provided.  Lab Results  Component Value Date   HGBA1C 10.5 (H) 08/08/2018   CBG (last 3)  Recent Labs    01/18/20 1628 01/18/20 2035 01/19/20 0751  GLUCAP 92 113* 147*       Condition - Extremely Guarded  Family Communication  : Wife updated 01/13/2020, 01/14/20, 01/17/20  Code Status : Full code  Diet :    Diet Order            Diet heart healthy/carb modified Room service appropriate? Yes; Fluid consistency: Thin  Diet effective now               Disposition Plan  : Stay in the hospital, is septic with bacterial infection likely postobstructive pneumonia.  Needs HD catheter swap, old HD catheter removed on 01/16/2020 he got 1 day line holiday and he received new HD tunneled catheter placed on 01/18/2020, will get dialyzed this evening thereafter if he remains stable and no signs of continued bacteremia will be discharged 1 to 2 days later preferably to home.  Consults  : Pulmonary critical care , ID, Renal  Procedures  :  CT chest, abdomen and pelvis noncontrast   -   1. Right greater than left posterior upper lobe airspace and ground-glass opacity. Most consistent with pneumonia. This is not atypical appearance of COVID-19 pneumonia, which cannot be excluded. Other infectious etiologies should also be considered. 2. Aortic atherosclerosis (ICD10-I70.0), coronary artery atherosclerosis and emphysema (ICD10-J43.9). 3. Mild thoracic adenopathy, likely reactive. 4. Pulmonary artery enlargement suggests pulmonary arterial hypertension.   CT  ABDOMEN AND PELVIS IMPRESSION 1. Sigmoid wall thickening and pericolonic edema, likely related to non complicated diverticulitis. Separate edema posterior to the ascending colon is nonspecific and could represent an area of colitis or mild diverticulitis. 2. Hepatomegaly. Hepatic morphology, highly suspicious for cirrhosis.   PUD Prophylaxis : PPI  DVT Prophylaxis  :   Heparin   Lab Results  Component Value Date   PLT 238 01/19/2020    Inpatient Medications  Scheduled Meds: . allopurinol  100 mg Oral BID  . amLODipine  5 mg Oral Daily  . amoxicillin-clavulanate  1 tablet Oral Daily  . carvedilol  6.25 mg Oral BID WC  . Chlorhexidine Gluconate Cloth  6 each Topical Q0600  . cinacalcet  90 mg Oral Q M,W,F  . darbepoetin (ARANESP) injection - DIALYSIS  100 mcg Intravenous Q Sat-HD  . diphenhydrAMINE  25 mg Oral Q M,W,F  . ferric citrate  630 mg Oral BID WC  . furosemide  40 mg Oral Daily  . heparin      . insulin aspart  0-15 Units Subcutaneous TID WC  . pantoprazole  40 mg Oral Daily  . pravastatin  20 mg Oral Daily   Continuous Infusions: . chlorproMAZINE (THORAZINE) IV 25 mg (01/12/20 1949)   PRN Meds:.acetaminophen, chlorproMAZINE (THORAZINE) IV, heparin, mirtazapine, ondansetron (ZOFRAN) IV  Antibiotics  :    Anti-infectives (From admission, onward)   Start     Dose/Rate Route Frequency Ordered Stop   01/19/20 1000  amoxicillin-clavulanate (AUGMENTIN) 500-125 MG per tablet 500 mg     1 tablet Oral Daily 01/18/20 1407     01/16/20 1800  amoxicillin-clavulanate (AUGMENTIN) 500-125 MG per tablet 500 mg  Status:  Discontinued     1 tablet Oral Every 24 hours 01/16/20 1248 01/16/20 1259   01/16/20 1300  amoxicillin-clavulanate (AUGMENTIN) 500-125 MG per tablet 500 mg  Status:  Discontinued     1 tablet Oral 2 times daily 01/16/20 1259 01/18/20 1407   01/16/20 0800  cefTRIAXone (ROCEPHIN) 2 g in sodium chloride 0.9 % 100 mL IVPB  Status:  Discontinued     2 g 200 mL/hr over  30 Minutes Intravenous Every 24 hours 01/15/20 1451 01/16/20 1248   01/15/20 1500  cefTRIAXone (ROCEPHIN) 1 g in sodium chloride 0.9 % 100 mL IVPB     1 g 200 mL/hr over 30 Minutes Intravenous  Once 01/15/20 1451 01/15/20 1635   01/14/20 1400  metroNIDAZOLE (FLAGYL) tablet 500 mg  Status:  Discontinued    Note to Pharmacy: Pharmacy can adjust for ESRD and diverticulitis   500 mg Oral Every 8 hours 01/14/20 1126 01/16/20 1248   01/14/20 0800  cefTRIAXone (ROCEPHIN) 1 g in sodium chloride 0.9 % 100 mL IVPB  Status:  Discontinued    Note to Pharmacy: Pharmacy can adjust for ESRD, strep pneumonaue pneumonia   1 g 200 mL/hr over 30 Minutes Intravenous Every 24 hours 01/14/20 0751 01/15/20 1451   01/13/20 1615  vancomycin (VANCOCIN) IVPB 1000 mg/200 mL premix     1,000 mg 200 mL/hr over 60  Minutes Intravenous  Once 01/13/20 1606 01/13/20 1806   01/12/20 1800  ceFEPIme (MAXIPIME) 1 g in sodium chloride 0.9 % 100 mL IVPB  Status:  Discontinued     1 g 200 mL/hr over 30 Minutes Intravenous Every 24 hours 01/12/20 1348 01/14/20 0750   01/12/20 1409  vancomycin variable dose per unstable renal function (pharmacist dosing)  Status:  Discontinued      Does not apply See admin instructions 01/12/20 1409 01/14/20 0750   01/12/20 1000  remdesivir 100 mg in sodium chloride 0.9 % 100 mL IVPB     100 mg 200 mL/hr over 30 Minutes Intravenous Daily 01/11/20 1943 01/15/20 1159   01/12/20 0100  cefTRIAXone (ROCEPHIN) 1 g in sodium chloride 0.9 % 100 mL IVPB  Status:  Discontinued     1 g 200 mL/hr over 30 Minutes Intravenous Daily at bedtime 01/11/20 1903 01/12/20 1204   01/11/20 2030  remdesivir 100 mg in sodium chloride 0.9 % 100 mL IVPB  Status:  Discontinued     100 mg 200 mL/hr over 30 Minutes Intravenous Every 1 hr x 2 01/11/20 1942 01/11/20 1944   01/11/20 2000  azithromycin (ZITHROMAX) 500 mg in sodium chloride 0.9 % 250 mL IVPB  Status:  Discontinued     500 mg 250 mL/hr over 60 Minutes Intravenous  Every 24 hours 01/11/20 1903 01/12/20 1423   01/11/20 2000  remdesivir 200 mg in sodium chloride 0.9% 250 mL IVPB     200 mg 580 mL/hr over 30 Minutes Intravenous Once 01/11/20 1945 01/11/20 2130   01/11/20 1730  vancomycin (VANCOCIN) 2,500 mg in sodium chloride 0.9 % 500 mL IVPB     2,500 mg 250 mL/hr over 120 Minutes Intravenous  Once 01/11/20 1657 01/11/20 2005   01/11/20 1715  ceFEPIme (MAXIPIME) 1 g in sodium chloride 0.9 % 100 mL IVPB     1 g 200 mL/hr over 30 Minutes Intravenous  Once 01/11/20 1652 01/11/20 1806       Time Spent in minutes  30   Lala Lund M.D on 01/19/2020 at 11:53 AM  To page go to [email or web address] - password [facility]  [facility] -  Office  [phone]  See all Orders from today for further details    Objective:   Vitals:   01/19/20 0900 01/19/20 0930 01/19/20 1000 01/19/20 1048  BP: 132/60 102/60 92/63 111/69  Pulse: 72 68 66   Resp:    16  Temp:    98 F (36.7 C)  TempSrc:      SpO2:    95%  Weight:      Height:        Wt Readings from Last 3 Encounters:  01/19/20 125.6 kg  01/11/20 128 kg  12/25/19 134.7 kg     Intake/Output Summary (Last 24 hours) at 01/19/2020 1153 Last data filed at 01/19/2020 1100 Gross per 24 hour  Intake 240 ml  Output -157 ml  Net 397 ml     Physical Exam  Awake Alert, No new F.N deficits, Normal affect [place].AT,PERRAL Supple Neck,No JVD, No cervical lymphadenopathy appriciated.  Symmetrical Chest wall movement, Good air movement bilaterally, CTAB RRR,No Gallops, Rubs or new Murmurs, No Parasternal Heave +ve B.Sounds, Abd Soft, No tenderness, No organomegaly appriciated, No rebound - guarding or rigidity. No Cyanosis, new L. subclavian HD catheter       Data Review:    CBC Recent Labs  Lab 01/15/20 0730 01/16/20 7915 01/17/20 0310 01/18/20 0569 01/19/20 0446  WBC 22.1* 21.4* 23.9* 26.3* 19.3*  HGB 10.4* 10.4* 10.6* 11.1* 10.9*  HCT 33.7* 34.1* 34.9* 36.0* 37.0*  PLT 286 277 257 219 238   MCV 76.1* 75.8* 75.5* 76.1* 77.4*  MCH 23.5* 23.1* 22.9* 23.5* 22.8*  MCHC 30.9 30.5 30.4 30.8 29.5*  RDW 18.1* 17.8* 18.1* 18.9* 19.6*  LYMPHSABS 1.3 0.9 2.3 0.8 1.9  MONOABS 1.3* 1.1* 1.8* 1.6* 0.6  EOSABS 0.0 0.0 0.1 0.0 0.2  BASOSABS 0.1 0.0 0.0 0.0 0.0    Chemistries  Recent Labs  Lab 01/15/20 0730 01/16/20 0311 01/17/20 0310 01/18/20 0816 01/19/20 0446  NA 136 132* 135 136 138  K 4.1 3.9 3.4* 4.6 3.8  CL 98 97* 99 100 100  CO2 20* 18* 17* 15* 18*  GLUCOSE 237* 319* 262* 209* 149*  BUN 60* 77* 59* 82* 94*  CREATININE 11.27* 12.49* 10.94* 14.26* 16.56*  CALCIUM 8.4* 8.9 8.4* 9.0 8.5*  MG 2.0 1.9 2.0 2.1 2.3  AST 14* 9* 13* 13* 11*  ALT _0 ALKPHOS 102 98 98 78 78  BILITOT 0.5 0.6 0.5 0.4 0.5   ------------------------------------------------------------------------------------------------------------------ No results for input(s): CHOL, HDL, LDLCALC, TRIG, CHOLHDL, LDLDIRECT in the last 72 hours.  Lab Results  Component Value Date   HGBA1C 10.5 (H) 08/08/2018   ------------------------------------------------------------------------------------------------------------------ No results for input(s): TSH, T4TOTAL, T3FREE, THYROIDAB in the last 72 hours.  Invalid input(s): FREET3  Cardiac Enzymes No results for input(s): CKMB, TROPONINI, MYOGLOBIN in the last 168 hours.  Invalid input(s): CK ------------------------------------------------------------------------------------------------------------------    Component Value Date/Time   BNP 41.1 01/19/2020 0446    Micro Results Recent Results (from the past 240 hour(s))  Respiratory Panel by RT PCR (Flu A&B, Covid) - Nasopharyngeal Swab     Status: Abnormal   Collection Time: 01/11/20  4:15 PM   Specimen: Nasopharyngeal Swab  Result Value Ref Range Status   SARS Coronavirus 2 by RT PCR POSITIVE (A) NEGATIVE Final    Comment: RESULT CALLED TO, READ BACK BY AND VERIFIED WITH: Kathryne Sharper RN  [phone] 01/11/20 JM (NOTE) SARS-CoV-2 target nucleic acids are DETECTED. SARS-CoV-2 RNA is generally detectable in upper respiratory specimens  during the acute phase of infection. Positive results are indicative of the presence of the identified virus, but do not rule out bacterial infection or co-infection with other pathogens not detected by the test. Clinical correlation with patient history and other diagnostic information is necessary to determine patient infection status. The expected result is Negative. Fact Sheet for Patients:  PinkCheek.be Fact Sheet for Healthcare Providers: GravelBags.it This test is not yet approved or cleared by the [place] FDA and  has been authorized for detection and/or diagnosis of SARS-CoV-2 by FDA under an Emergency Use Authorization (EUA).  This EUA will remain in effect (meaning this test can be used) for th e duration of  the COVID-19 declaration under Section 564(b)(1) of the Act, 21 U.S.C. section 360bbb-3(b)(1), unless the authorization is terminated or revoked sooner.    Influenza A by PCR NEGATIVE NEGATIVE Final   Influenza B by PCR NEGATIVE NEGATIVE Final    Comment: (NOTE) The Xpert Xpress SARS-CoV-2/FLU/RSV assay is intended as an aid in  the diagnosis of influenza from Nasopharyngeal swab specimens and  should not be used as a sole basis for treatment. Nasal washings and  aspirates are unacceptable for Xpert Xpress SARS-CoV-2/FLU/RSV  testing. Fact Sheet for Patients: PinkCheek.be Fact Sheet for Healthcare Providers: GravelBags.it This test is not yet approved  or cleared by the [place] and  has been authorized for detection and/or diagnosis of SARS-CoV-2 by  FDA under an Emergency Use Authorization (EUA). This EUA will remain  in effect (meaning this test can be used) for the duration of the  Covid-19  declaration under Section 564(b)(1) of the Act, 21  U.S.C. section 360bbb-3(b)(1), unless the authorization is  terminated or revoked. Performed at [facility], [place] [address], [place], [place] [address]   Culture, blood (routine x 2)     Status: Abnormal   Collection Time: 01/11/20  4:52 PM   Specimen: BLOOD  Result Value Ref Range Status   Specimen Description   Final    BLOOD RIGHT ANTECUBITAL Performed at [facility] [address], [place], [place] [address]    Special Requests   Final    BOTTLES DRAWN AEROBIC AND ANAEROBIC Blood Culture adequate volume Performed at [facility] [address] [place], [place] [address]    Culture  Setup Time   Final    GRAM POSITIVE COCCI IN CHAINS AEROBIC BOTTLE ONLY CRITICAL RESULT CALLED TO, READ BACK BY AND VERIFIED WITH: PHARMD L CHEN [id number] AT 1011 BY CM Performed at [facility], [place] [address] [place], [place] [address]    Culture STREPTOCOCCUS PNEUMONIAE (A)  Final   Report Status 01/14/2020 FINAL  Final   Organism ID, Bacteria STREPTOCOCCUS PNEUMONIAE  Final      Susceptibility   Streptococcus pneumoniae - MIC*    ERYTHROMYCIN <=0.12 SENSITIVE Sensitive     LEVOFLOXACIN 0.5 SENSITIVE Sensitive     VANCOMYCIN 0.5 SENSITIVE Sensitive     PENICILLIN (meningitis) 0.12 RESISTANT Resistant     PENO - penicillin 0.12      PENICILLIN (non-meningitis) 0.12 SENSITIVE Sensitive     PENICILLIN (oral) 0.12 INTERMEDIATE Intermediate     CEFTRIAXONE (non-meningitis) <=0.12 SENSITIVE Sensitive     CEFTRIAXONE (meningitis) <=0.12 SENSITIVE Sensitive     * STREPTOCOCCUS PNEUMONIAE  Culture, blood (routine x 2)     Status: None   Collection Time: 01/11/20  5:46 PM   Specimen: BLOOD LEFT HAND  Result Value Ref Range Status   Specimen Description   Final    BLOOD LEFT HAND Performed at [facility] [address] [place], [place] [address]    Special  Requests   Final    BOTTLES DRAWN AEROBIC ONLY Blood Culture adequate volume Performed at [facility] [address], [place], [place] [address]    Culture   Final    NO GROWTH 5 DAYS Performed at [facility], [place] [address], [place], [place] [address]    Report Status 01/16/2020 FINAL  Final  MRSA PCR Screening     Status: None   Collection Time: 01/12/20  2:28 AM   Specimen: Nasal Mucosa; Nasopharyngeal  Result Value Ref Range Status   MRSA by PCR NEGATIVE NEGATIVE Final    Comment:        The GeneXpert MRSA Assay (FDA approved for NASAL specimens only), is one component of a comprehensive MRSA colonization surveillance program. It is not intended to diagnose MRSA infection nor to guide or monitor treatment for MRSA infections. Performed at [facility], [place] [address], [place], [place] [address]   Culture, blood (routine x 2)     Status: None   Collection Time: 01/14/20 12:10 PM   Specimen: BLOOD  Result Value Ref Range  Status   Specimen Description BLOOD LEFT ANTECUBITAL  Final   Special Requests   Final    BOTTLES DRAWN AEROBIC ONLY Blood Culture results may not be optimal due to an inadequate volume of blood received in culture bottles   Culture   Final    NO GROWTH 5 DAYS Performed at [facility], [place] [address] [place], [place] [address]    Report Status 01/19/2020 FINAL  Final  Culture, blood (routine x 2)     Status: None   Collection Time: 01/14/20 12:13 PM   Specimen: BLOOD LEFT HAND  Result Value Ref Range Status   Specimen Description BLOOD LEFT HAND  Final   Special Requests   Final    BOTTLES DRAWN AEROBIC ONLY Blood Culture results may not be optimal due to an inadequate volume of blood received in culture bottles   Culture   Final    NO GROWTH 5 DAYS Performed at [facility], [place] [address] [place], [place] [address]    Report Status 01/19/2020 FINAL  Final    Radiology Reports CT ABDOMEN PELVIS  WO CONTRAST  Result Date: 01/13/2020 CLINICAL DATA:  Follow-up of COVID pneumonia. Lung nodule on chest radiograph. Diffuse abdominal pain. EXAM: CT CHEST, ABDOMEN AND PELVIS WITHOUT CONTRAST TECHNIQUE: Multidetector CT imaging of the chest, abdomen and pelvis was performed following the standard protocol without IV contrast. COMPARISON:  Chest radiograph 01/12/2020. CT of the abdomen and pelvis of 12/05/2018. CTA of the chest of 03/04/2017. FINDINGS: CT CHEST FINDINGS Cardiovascular: A left sided central line terminates in the right atrium. Aortic atherosclerosis. Tortuous thoracic aorta. Moderate cardiomegaly, without pericardial effusion. Multivessel coronary artery atherosclerosis. Pulmonary artery enlargement, outflow tract 3.6 cm. 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 Mediastinum/Nodes: Right paratracheal node of 1.1 cm Hilar regions poorly evaluated without intravenous contrast. Lungs/Pleura: No pleural fluid.  Mild centrilobular emphysema. Right greater than left peripheral upper lobe airspace disease with mild surrounding ground-glass. Musculoskeletal: No acute osseous abnormality. CT ABDOMEN PELVIS FINDINGS Hepatobiliary: Hepatomegaly at 19.2 cm craniocaudal. Hepatic morphology, including caudate lobe enlargement and irregular hepatic capsule, highly suspicious for mild cirrhosis. Normal gallbladder, without biliary ductal dilatation. Pancreas: Normal, without mass or ductal dilatation. Spleen: Normal in size, without focal abnormality. Adrenals/Urinary Tract: Mild bilateral adrenal thickening. Native renal atrophy on the left. Right nephrectomy. Multiple low-density left renal lesions are likely cysts. No bladder calculi. Stomach/Bowel: Normal stomach, without wall thickening. Scattered colonic diverticula. Wall thickening within the sigmoid with mild surrounding edema, including on 95/3. No pericolonic fluid collection or free perforation. Separate area of pericolonic  edema posterior to the ascending colon including on 84/3. Vascular/Lymphatic: Aortic and branch vessel atherosclerosis. Reproductive: No abdominopelvic adenopathy.  Normal prostate. Other: Fat containing left inguinal hernia. No significant free fluid. Tiny periumbilical fat containing ventral abdominal wall laxity. Musculoskeletal: Renal osteodystrophy.  Lumbosacral spondylosis. IMPRESSION: CT CHEST IMPRESSION 1. Right greater than left posterior upper lobe airspace and ground-glass opacity. Most consistent with pneumonia. This is not atypical appearance of COVID-19 pneumonia, which cannot be excluded. Other infectious etiologies should also be considered. 2. Aortic atherosclerosis (ICD10-I70.0), coronary artery atherosclerosis and emphysema (ICD10-J43.9). 3. Mild thoracic adenopathy, likely reactive. 4. Pulmonary artery enlargement suggests pulmonary arterial hypertension. CT ABDOMEN AND PELVIS IMPRESSION 1. Sigmoid wall thickening and pericolonic edema, likely related to non complicated diverticulitis. Separate edema posterior to the ascending colon is nonspecific and could represent an area of colitis or mild diverticulitis. 2. Hepatomegaly. Hepatic morphology, highly suspicious  for cirrhosis. Electronically Signed   By: Abigail Miyamoto M.D.   On: 01/13/2020 12:36   CT CHEST WO CONTRAST  Result Date: 01/13/2020 CLINICAL DATA:  Follow-up of COVID pneumonia. Lung nodule on chest radiograph. Diffuse abdominal pain. EXAM: CT CHEST, ABDOMEN AND PELVIS WITHOUT CONTRAST TECHNIQUE: Multidetector CT imaging of the chest, abdomen and pelvis was performed following the standard protocol without IV contrast. COMPARISON:  Chest radiograph 01/12/2020. CT of the abdomen and pelvis of 12/05/2018. CTA of the chest of 03/04/2017. FINDINGS: CT CHEST FINDINGS Cardiovascular: A left sided central line terminates in the right atrium. Aortic atherosclerosis. Tortuous thoracic aorta. Moderate cardiomegaly, without pericardial  effusion. Multivessel coronary artery atherosclerosis. Pulmonary artery enlargement, outflow tract 3.6 cm. 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 Mediastinum/Nodes: Right paratracheal node of 1.1 cm Hilar regions poorly evaluated without intravenous contrast. Lungs/Pleura: No pleural fluid.  Mild centrilobular emphysema. Right greater than left peripheral upper lobe airspace disease with mild surrounding ground-glass. Musculoskeletal: No acute osseous abnormality. CT ABDOMEN PELVIS FINDINGS Hepatobiliary: Hepatomegaly at 19.2 cm craniocaudal. Hepatic morphology, including caudate lobe enlargement and irregular hepatic capsule, highly suspicious for mild cirrhosis. Normal gallbladder, without biliary ductal dilatation. Pancreas: Normal, without mass or ductal dilatation. Spleen: Normal in size, without focal abnormality. Adrenals/Urinary Tract: Mild bilateral adrenal thickening. Native renal atrophy on the left. Right nephrectomy. Multiple low-density left renal lesions are likely cysts. No bladder calculi. Stomach/Bowel: Normal stomach, without wall thickening. Scattered colonic diverticula. Wall thickening within the sigmoid with mild surrounding edema, including on 95/3. No pericolonic fluid collection or free perforation. Separate area of pericolonic edema posterior to the ascending colon including on 84/3. Vascular/Lymphatic: Aortic and branch vessel atherosclerosis. Reproductive: No abdominopelvic adenopathy.  Normal prostate. Other: Fat containing left inguinal hernia. No significant free fluid. Tiny periumbilical fat containing ventral abdominal wall laxity. Musculoskeletal: Renal osteodystrophy.  Lumbosacral spondylosis. IMPRESSION: CT CHEST IMPRESSION 1. Right greater than left posterior upper lobe airspace and ground-glass opacity. Most consistent with pneumonia. This is not atypical appearance of COVID-19 pneumonia, which cannot be excluded. Other infectious etiologies  should also be considered. 2. Aortic atherosclerosis (ICD10-I70.0), coronary artery atherosclerosis and emphysema (ICD10-J43.9). 3. Mild thoracic adenopathy, likely reactive. 4. Pulmonary artery enlargement suggests pulmonary arterial hypertension. CT ABDOMEN AND PELVIS IMPRESSION 1. Sigmoid wall thickening and pericolonic edema, likely related to non complicated diverticulitis. Separate edema posterior to the ascending colon is nonspecific and could represent an area of colitis or mild diverticulitis. 2. Hepatomegaly. Hepatic morphology, highly suspicious for cirrhosis. Electronically Signed   By: Abigail Miyamoto M.D.   On: 01/13/2020 12:36   IR Removal Tun Cv Cath W/O FL  Result Date: 01/16/2020 INDICATION: Streptococcus pneumoniae bacteremia. Request for removal of tunneled hemodialysis catheter. Initially placed at an outside facility. EXAM: REMOVAL OF TUNNELED HEMODIALYSIS CATHETER MEDICATIONS: 1% lidocaine with epinephrine 4 mL COMPLICATIONS: None immediate. PROCEDURE: Informed written consent was obtained from the patient following an explanation of the procedure, risks, benefits and alternatives to treatment. A time out was performed prior to the initiation of the procedure. Maximal barrier sterile technique was utilized including caps, mask, sterile gowns, sterile gloves, large sterile drape, hand hygiene, and Betadine. 1% lidocaine with epinephrine was injected under sterile conditions along the subcutaneous tunnel. Utilizing a combination of blunt dissection and gentle traction, the catheter was removed intact. Hemostasis was obtained with manual compression. A dressing was placed. The patient tolerated the procedure well without immediate post procedural complication. IMPRESSION: Successful removal of tunneled dialysis catheter. Read by:  Gareth Eagle, PA-C Electronically Signed   By: Jacqulynn Cadet M.D.   On: 01/16/2020 14:54   DG Chest Port 1 View  Result Date: 01/14/2020 CLINICAL DATA:  Shortness  of breath, COVID positive EXAM: PORTABLE CHEST 1 VIEW COMPARISON:  01/12/2020 FINDINGS: Left dialysis catheter remains in place, unchanged. Patchy airspace disease in the right upper lobe again noted. Overall, airspace disease throughout the right lung has improved. No confluent opacity on the left. No effusions. No acute bony abnormality. Heart is borderline in size. IMPRESSION: Improving airspace disease with mild residual patchy right upper lobe opacities. Electronically Signed   By: Rolm Baptise M.D.   On: 01/14/2020 11:33   DG Chest Port 1 View  Result Date: 01/12/2020 CLINICAL DATA:  Short of breath EXAM: PORTABLE CHEST 1 VIEW COMPARISON:  01/11/2020 FINDINGS: Right upper lobe airspace disease unchanged. Improved aeration in the lung bases with improved lung volume. Negative for edema or effusion. Central venous catheter tip in the right atrium unchanged. IMPRESSION: Persistent right upper lobe airspace disease unchanged Improved aeration lungs with decrease in bibasilar atelectasis. Electronically Signed   By: Franchot Gallo M.D.   On: 01/12/2020 09:16   DG Chest Port 1 View  Result Date: 01/11/2020 CLINICAL DATA:  Shortness of breath EXAM: PORTABLE CHEST 1 VIEW COMPARISON:  01/10/2019 FINDINGS: There is bilateral diffuse interstitial thickening. There is a nodular appearance along the right suprahilar region likely reflecting prominent pulmonary vasculature or airspace disease. There is no pleural effusion or pneumothorax. The heart mediastinum are stable. There is a large bore left-sided central venous catheter with the tip projecting over the cavoatrial junction. There is no acute osseous abnormality. IMPRESSION: Diffuse bilateral interstitial thickening likely reflecting interstitial edema. Nodular appearance along the right suprahilar region likely reflecting prominent pulmonary vasculature or airspace disease. Electronically Signed   By: Kathreen Devoid   On: 01/11/2020 16:31   DG Chest Port 1  View  Result Date: 01/11/2020 CLINICAL DATA:  Cough, short of breath EXAM: PORTABLE CHEST 1 VIEW COMPARISON:  03/07/2017 FINDINGS: Single frontal view of the chest demonstrates a left internal jugular dialysis catheter tip overlying atrial caval junction. Vascular stent overlies the right axilla. Cardiac silhouette is stable. There is a 7.3 cm mass projecting over the right anterior first and second ribs. CT chest with IV contrast recommended for suspected neoplasm. There is central vascular congestion with diffuse interstitial prominence consistent with mild fluid overload. Right hilar prominence could also reflect lymphadenopathy. No effusion or pneumothorax. No acute bony abnormality. IMPRESSION: 1. 7.3 cm right upper lobe mass consistent with neoplasm. CT chest with IV contrast is recommended. 2. Right hilar prominence could reflect underlying adenopathy. 3. Vascular congestion and interstitial edema. Electronically Signed   By: Randa Ngo M.D.   On: 01/11/2020 04:02   VAS [place] UPPER EXTREMITY ARTERIAL DUPLEX  Result Date: 12/25/2019 UPPER EXTREMITY DUPLEX STUDY Indications: Pre-operative exam.  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 Bilateral testing is considered an integral part of a complete examination. Limited examinations for reoccurring indications may be performed as noted.  Right Pre-Dialysis Findings: +-----------------------+----------+--------------------+---------+--------+ Location               PSV (cm/s)Intralum. Diam. (  cm)Waveform Comments +-----------------------+----------+--------------------+---------+--------+ Brachial Antecub. fossa40        0.46                triphasic          +-----------------------+----------+--------------------+---------+--------+ Radial Art at Wrist    35        0.29                triphasic         +-----------------------+----------+--------------------+---------+--------+ Ulnar Art at Wrist     81        0.27                triphasic         +-----------------------+----------+--------------------+---------+--------+ +------------+-----------------+ Allen's TestBranches at fossa +------------+-----------------+ Left Pre-Dialysis Findings: +-----------------------+----------+--------------------+---------+--------+ Location               PSV (cm/s)Intralum. Diam. (cm)Waveform Comments +-----------------------+----------+--------------------+---------+--------+ Brachial Antecub. fossa54        0.52                triphasic         +-----------------------+----------+--------------------+---------+--------+ Radial Art at Wrist    44        0.27                triphasictortuous +-----------------------+----------+--------------------+---------+--------+ Ulnar Art at Wrist     73        0.19                triphasic         +-----------------------+----------+--------------------+---------+--------+ +------------+-----------------+ Allen's TestBranches at fossa +------------+-----------------+  Summary:   Measurements above. *See table(s) above for measurements and observations. Electronically signed by Curt Jews MD on 12/25/2019 at 9:30:15 AM.    Final    VAS [place] UPPER EXTREMITY VEIN MAPPING  Result Date: 12/25/2019 UPPER EXTREMITY VEIN MAPPING  Indications: Pre-access. History: History of right Radiocephalic fistula years ago.  Performing Technologist: Alvia Grove RVT  Examination Guidelines: A complete evaluation includes B-mode imaging, spectral Doppler, color Doppler, and power Doppler as needed of all accessible portions of each vessel. Bilateral testing is considered an integral part of a complete examination.  Limited examinations for reoccurring indications may be performed as noted. +-----------------+-------------+----------+--------+ Right Cephalic   Diameter (cm)Depth (cm)Findings +-----------------+-------------+----------+--------+ Prox upper arm                             [place]    +-----------------+-------------+----------+--------+ Mid upper arm                              [place]    +-----------------+-------------+----------+--------+ Dist upper arm                             [place]    +-----------------+-------------+----------+--------+ Antecubital fossa                          [place]    +-----------------+-------------+----------+--------+ +-----------------+-------------+----------+---------+ Right Basilic    Diameter (cm)Depth (cm)Findings  +-----------------+-------------+----------+---------+ Prox upper arm       0.46                         +-----------------+-------------+----------+---------+ Mid upper arm        0.68  branching +-----------------+-------------+----------+---------+ Dist upper arm       0.34                         +-----------------+-------------+----------+---------+ Antecubital fossa    0.31                         +-----------------+-------------+----------+---------+ Prox forearm         0.27                         +-----------------+-------------+----------+---------+ +-----------------+-------------+----------+--------+ Left Cephalic    Diameter (cm)Depth (cm)Findings +-----------------+-------------+----------+--------+ Shoulder             0.47                        +-----------------+-------------+----------+--------+ Prox upper arm       0.36                        +-----------------+-------------+----------+--------+ Mid upper arm        0.41                        +-----------------+-------------+----------+--------+ Dist upper arm       0.43                         +-----------------+-------------+----------+--------+ Antecubital fossa    0.54                        +-----------------+-------------+----------+--------+ Prox forearm         0.43                        +-----------------+-------------+----------+--------+ Mid forearm          0.38                        +-----------------+-------------+----------+--------+ Dist forearm         0.33                        +-----------------+-------------+----------+--------+ +-----------------+-------------+----------+--------+ Left Basilic     Diameter (cm)Depth (cm)Findings +-----------------+-------------+----------+--------+ Prox upper arm       0.48                        +-----------------+-------------+----------+--------+ Mid upper arm        0.51                        +-----------------+-------------+----------+--------+ Dist upper arm       0.47                        +-----------------+-------------+----------+--------+ Antecubital fossa    0.26                        +-----------------+-------------+----------+--------+ Prox forearm         0.25                        +-----------------+-------------+----------+--------+ Summary: Right: Occluded right forearm fistula. Thrombus in the cephalic vein        from the antecubital fossa extending proximally into the  subclavian vein, appearing chronic. Left: Measurements above. *See table(s) above for measurements and observations.  Diagnosing physician: Curt Jews MD Electronically signed by Curt Jews MD on 12/25/2019 at 9:27:41 AM.    Final

## 2020-01-20 LAB — CBC WITH DIFFERENTIAL/PLATELET
Abs Immature Granulocytes: 0 10*3/uL (ref 0.00–0.07)
Basophils Absolute: 0.1 10*3/uL (ref 0.0–0.1)
Basophils Relative: 1 %
Eosinophils Absolute: 0.5 10*3/uL (ref 0.0–0.5)
Eosinophils Relative: 4 %
HCT: 35.6 % — ABNORMAL LOW (ref 39.0–52.0)
Hemoglobin: 10.5 g/dL — ABNORMAL LOW (ref 13.0–17.0)
Lymphocytes Relative: 18 %
Lymphs Abs: 2.2 10*3/uL (ref 0.7–4.0)
MCH: 23.1 pg — ABNORMAL LOW (ref 26.0–34.0)
MCHC: 29.5 g/dL — ABNORMAL LOW (ref 30.0–36.0)
MCV: 78.2 fL — ABNORMAL LOW (ref 80.0–100.0)
Monocytes Absolute: 0.8 10*3/uL (ref 0.1–1.0)
Monocytes Relative: 7 %
Neutro Abs: 8.5 10*3/uL — ABNORMAL HIGH (ref 1.7–7.7)
Neutrophils Relative %: 70 %
Platelets: 203 10*3/uL (ref 150–400)
RBC: 4.55 MIL/uL (ref 4.22–5.81)
RDW: 19.4 % — ABNORMAL HIGH (ref 11.5–15.5)
WBC: 12.1 10*3/uL — ABNORMAL HIGH (ref 4.0–10.5)
nRBC: 0 /100 WBC
nRBC: 0.7 % — ABNORMAL HIGH (ref 0.0–0.2)

## 2020-01-20 LAB — BRAIN NATRIURETIC PEPTIDE: B Natriuretic Peptide: 28.8 pg/mL (ref 0.0–100.0)

## 2020-01-20 LAB — GLUCOSE, CAPILLARY
Glucose-Capillary: 121 mg/dL — ABNORMAL HIGH (ref 70–99)
Glucose-Capillary: 140 mg/dL — ABNORMAL HIGH (ref 70–99)
Glucose-Capillary: 168 mg/dL — ABNORMAL HIGH (ref 70–99)
Glucose-Capillary: 216 mg/dL — ABNORMAL HIGH (ref 70–99)

## 2020-01-20 LAB — COMPREHENSIVE METABOLIC PANEL
ALT: 12 U/L (ref 0–44)
AST: 10 U/L — ABNORMAL LOW (ref 15–41)
Albumin: 2.8 g/dL — ABNORMAL LOW (ref 3.5–5.0)
Alkaline Phosphatase: 90 U/L (ref 38–126)
Anion gap: 19 — ABNORMAL HIGH (ref 5–15)
BUN: 95 mg/dL — ABNORMAL HIGH (ref 8–23)
CO2: 17 mmol/L — ABNORMAL LOW (ref 22–32)
Calcium: 8.4 mg/dL — ABNORMAL LOW (ref 8.9–10.3)
Chloride: 102 mmol/L (ref 98–111)
Creatinine, Ser: 16.37 mg/dL — ABNORMAL HIGH (ref 0.61–1.24)
GFR calc Af Amer: 3 mL/min — ABNORMAL LOW (ref >60)
GFR calc non Af Amer: 3 mL/min — ABNORMAL LOW (ref >60)
Glucose, Bld: 142 mg/dL — ABNORMAL HIGH (ref 70–99)
Potassium: 4.1 mmol/L (ref 3.5–5.1)
Sodium: 138 mmol/L (ref 135–145)
Total Bilirubin: 0.6 mg/dL (ref 0.3–1.2)
Total Protein: 6.7 g/dL (ref 6.5–8.1)

## 2020-01-20 LAB — PROCALCITONIN: Procalcitonin: 2.67 ng/mL

## 2020-01-20 LAB — C-REACTIVE PROTEIN: CRP: 2 mg/dL — ABNORMAL HIGH (ref <1.0)

## 2020-01-20 LAB — D-DIMER, QUANTITATIVE: D-Dimer, Quant: 3.33 ug/mL-FEU — ABNORMAL HIGH (ref 0.00–0.50)

## 2020-01-20 LAB — MAGNESIUM: Magnesium: 2.2 mg/dL (ref 1.7–2.4)

## 2020-01-20 MED ORDER — HEPARIN SODIUM (PORCINE) 1000 UNIT/ML IJ SOLN
INTRAMUSCULAR | Status: AC
  Administered 2020-01-20: 6000 [IU] via INTRAVENOUS_CENTRAL
  Filled 2020-01-20: qty 6

## 2020-01-20 MED ORDER — ALTEPLASE 2 MG IJ SOLR
INTRAMUSCULAR | Status: AC
  Administered 2020-01-21: 2 mg via INTRAVENOUS
  Filled 2020-01-20: qty 4

## 2020-01-20 NOTE — Progress Notes (Signed)
PROGRESS NOTE                                                                                                                                                                                                             Patient Demographics:    [name], is a 64 y.o. male, DOB - 1956-04-07, VZD:[id number]  Outpatient Primary MD for the patient is [name], MD    LOS - 9  Admit date - 01/11/2020    Chief Complaint  Patient presents with  . Emesis  . Shortness of Breath  . Sore Throat       Brief Narrative  [name] is a 64 y.o. male with medical history significant for  HTN, OSA, chronic Hep C, Type 2 diabetes and ESRD on HD MW/F who presents with concerns of "not feeling well."  His work-up suggested pneumonia with chest x-ray showing right upper lobe mass suspicious for malignancy.  He also had COVID-19 infection and was admitted to the hospital.   Subjective:   Patient in bed, appears comfortable, denies any headache, no fever, no chest pain or pressure, no shortness of breath , no abdominal pain. No focal weakness.    Assessment  & Plan :     1. Acute Hypoxic Resp. Failure, positive COVID-19 infection and possible mild pneumonia, also newly diagnosed right upper lobe mass suspicious for postobstructive bacterial pneumonia VS CAP with Strep pneumonia bacteremia -   I think his main issue is sepsis from right upper lobe bacterial pneumonia with strep bacteremia, he was extremely toxic and sick, procalcitonin and CRP were both elevated, he has finished his Covid specific treatment and now being treated for bacterial infection and bacterial pneumonia, he was bacteremic with strep pneumonae hence we are removing his dialysis catheter. ID on board, his old HD catheter has been removed after dialysis on 01/16/2020, he has received 1 day of line holiday and received a new left-sided tunneled HD catheter placed by IR  on 01/18/2020.  Repeat blood cultures drawn on 01/14/2020 remain negative.  Procalcitonin trending down.  Repeat blood cultures are negative these were drawn on 01/14/2020.  Encouraged the patient to sit up in chair in the daytime use I-S and flutter valve for pulmonary toiletry and then prone in bed when at night.   SpO2: 95 % O2 Flow  Rate (L/min): 2 L/min  Recent Labs  Lab 01/16/20 0311 01/17/20 0310 01/18/20 0816 01/19/20 0446 01/20/20 0309  CRP 8.2* 5.5* 4.2* 3.5* 2.0*  DDIMER 2.47* 2.51* 2.07* 2.39* 3.33*  BNP 154.1* 71.6 76.2 41.1 28.8  PROCALCITON 9.22 6.57 4.45 3.65 2.67    Hepatic Function Latest Ref Rng & Units 01/20/2020 01/19/2020 01/18/2020  Total Protein 6.5 - 8.1 g/dL 6.7 6.8 6.9  Albumin 3.5 - 5.0 g/dL 2.8(L) 2.8(L) 2.8(L)  AST 15 - 41 U/L 10(L) 11(L) 13(L)  ALT 0 - 44 U/L '12 13 11  '$ Alk Phosphatase 38 - 126 U/L 90 78 78  Total Bilirubin 0.3 - 1.2 mg/dL 0.6 0.5 0.4  Bilirubin, Direct 0.0 - 0.2 mg/dL - - -    2.  Right upper lobe mass, history of right sided nephrectomy due to renal cell cancer in the past, history of smoking .  New diagnosis.  Her on CT more suspicious of infiltrate hence complete antibiotic course and then repeat imaging in 2 to 3 weeks by PCP if question persists outpatient pulmonary follow-up.  3.  ESRD, Monday Wednesday Friday schedule. Nephrology consulted.  Unfortunately new catheter placed on 01/18/2020 seems to have clotted off, TPA has been installed, nephrology and IR monitoring.  Plan is to dialyze him evening of 01/20/2020 and then discharge him the next day if he remains afebrile after dialysis.  4.  Anemia of chronic disease.  Hemoglobin close to 8.  Will monitor.  No need for transfusion.  5.  Hyponatremia and hypernatremia.  Management per nephrology and HD.  6.  OSA.  Supplemental oxygen at night, does not use CPAP.  7.  Dyslipidemia.  On statin.  8.  Essential hypertension.  On combination of Norvasc, Coreg and Lasix continue.  Still makes  some urine.  9.  Morbid obesity.  BMI 37.  Follow with PCP.  10. Left foot Charcot joint, right lower extremity shallow 1 cm noninfected ulcer present on admission.  Supportive care.   11.  Possible asymptomatic diverticulitis noted on CT scan.  Add Flagyl to Rocephin and monitor.  Symptom-free.   12.  DM type II.  SSI for now.  Poor outpatient glycemic control due to hyperglycemia, A1c of 10.5.  Diabetic and insulin education will be provided.  Lab Results  Component Value Date   HGBA1C 10.5 (H) 08/08/2018   CBG (last 3)  Recent Labs    01/19/20 1619 01/19/20 2359 01/20/20 0753  GLUCAP 171* 168* 140*       Condition -fair  Family Communication  : Wife updated 01/13/2020, 01/14/20, 01/17/20, 01/20/2020  Code Status : Full code  Diet :    Diet Order            Diet heart healthy/carb modified Room service appropriate? Yes; Fluid consistency: Thin  Diet effective now               Disposition Plan  : Stay in the hospital, is septic with bacterial infection likely postobstructive pneumonia.  Needs HD catheter swap, old HD catheter removed on 01/16/2020 he got 1 day line holiday and he received new HD tunneled catheter placed on 01/18/2020, will get dialyzed evening of 01/20/2020 thereafter if he remains stable and no signs of continued bacteremia will be discharged morning of 01/21/2020  Consults  : Pulmonary critical care , ID, Renal  Procedures  :    CT chest, abdomen and pelvis noncontrast   -   1. Right greater than left posterior upper  lobe airspace and ground-glass opacity. Most consistent with pneumonia. This is not atypical appearance of COVID-19 pneumonia, which cannot be excluded. Other infectious etiologies should also be considered. 2. Aortic atherosclerosis (ICD10-I70.0), coronary artery atherosclerosis and emphysema (ICD10-J43.9). 3. Mild thoracic adenopathy, likely reactive. 4. Pulmonary artery enlargement suggests pulmonary arterial hypertension.   CT ABDOMEN AND  PELVIS IMPRESSION 1. Sigmoid wall thickening and pericolonic edema, likely related to non complicated diverticulitis. Separate edema posterior to the ascending colon is nonspecific and could represent an area of colitis or mild diverticulitis. 2. Hepatomegaly. Hepatic morphology, highly suspicious for cirrhosis.   PUD Prophylaxis : PPI  DVT Prophylaxis  :   Heparin   Lab Results  Component Value Date   PLT 203 01/20/2020    Inpatient Medications  Scheduled Meds: . allopurinol  100 mg Oral BID  . amLODipine  5 mg Oral Daily  . amoxicillin-clavulanate  1 tablet Oral BID  . carvedilol  6.25 mg Oral BID WC  . Chlorhexidine Gluconate Cloth  6 each Topical Q0600  . cinacalcet  90 mg Oral Q M,W,F  . darbepoetin (ARANESP) injection - DIALYSIS  100 mcg Intravenous Q Sat-HD  . diphenhydrAMINE  25 mg Oral Q M,W,F  . ferric citrate  630 mg Oral BID WC  . furosemide  40 mg Oral Daily  . insulin aspart  0-15 Units Subcutaneous TID WC  . pantoprazole  40 mg Oral Daily  . pravastatin  20 mg Oral Daily   Continuous Infusions: . chlorproMAZINE (THORAZINE) IV 25 mg (01/12/20 1949)   PRN Meds:.acetaminophen, chlorproMAZINE (THORAZINE) IV, heparin, mirtazapine, ondansetron (ZOFRAN) IV  Antibiotics  :    Anti-infectives (From admission, onward)   Start     Dose/Rate Route Frequency Ordered Stop   01/19/20 2200  amoxicillin-clavulanate (AUGMENTIN) 500-125 MG per tablet 500 mg     1 tablet Oral 2 times daily 01/19/20 1409     01/19/20 1000  amoxicillin-clavulanate (AUGMENTIN) 500-125 MG per tablet 500 mg  Status:  Discontinued     1 tablet Oral Daily 01/18/20 1407 01/19/20 1409   01/16/20 1800  amoxicillin-clavulanate (AUGMENTIN) 500-125 MG per tablet 500 mg  Status:  Discontinued     1 tablet Oral Every 24 hours 01/16/20 1248 01/16/20 1259   01/16/20 1300  amoxicillin-clavulanate (AUGMENTIN) 500-125 MG per tablet 500 mg  Status:  Discontinued     1 tablet Oral 2 times daily 01/16/20 1259  01/18/20 1407   01/16/20 0800  cefTRIAXone (ROCEPHIN) 2 g in sodium chloride 0.9 % 100 mL IVPB  Status:  Discontinued     2 g 200 mL/hr over 30 Minutes Intravenous Every 24 hours 01/15/20 1451 01/16/20 1248   01/15/20 1500  cefTRIAXone (ROCEPHIN) 1 g in sodium chloride 0.9 % 100 mL IVPB     1 g 200 mL/hr over 30 Minutes Intravenous  Once 01/15/20 1451 01/15/20 1635   01/14/20 1400  metroNIDAZOLE (FLAGYL) tablet 500 mg  Status:  Discontinued    Note to Pharmacy: Pharmacy can adjust for ESRD and diverticulitis   500 mg Oral Every 8 hours 01/14/20 1126 01/16/20 1248   01/14/20 0800  cefTRIAXone (ROCEPHIN) 1 g in sodium chloride 0.9 % 100 mL IVPB  Status:  Discontinued    Note to Pharmacy: Pharmacy can adjust for ESRD, strep pneumonaue pneumonia   1 g 200 mL/hr over 30 Minutes Intravenous Every 24 hours 01/14/20 0751 01/15/20 1451   01/13/20 1615  vancomycin (VANCOCIN) IVPB 1000 mg/200 mL premix  1,000 mg 200 mL/hr over 60 Minutes Intravenous  Once 01/13/20 1606 01/13/20 1806   01/12/20 1800  ceFEPIme (MAXIPIME) 1 g in sodium chloride 0.9 % 100 mL IVPB  Status:  Discontinued     1 g 200 mL/hr over 30 Minutes Intravenous Every 24 hours 01/12/20 1348 01/14/20 0750   01/12/20 1409  vancomycin variable dose per unstable renal function (pharmacist dosing)  Status:  Discontinued      Does not apply See admin instructions 01/12/20 1409 01/14/20 0750   01/12/20 1000  remdesivir 100 mg in sodium chloride 0.9 % 100 mL IVPB     100 mg 200 mL/hr over 30 Minutes Intravenous Daily 01/11/20 1943 01/15/20 1159   01/12/20 0100  cefTRIAXone (ROCEPHIN) 1 g in sodium chloride 0.9 % 100 mL IVPB  Status:  Discontinued     1 g 200 mL/hr over 30 Minutes Intravenous Daily at bedtime 01/11/20 1903 01/12/20 1204   01/11/20 2030  remdesivir 100 mg in sodium chloride 0.9 % 100 mL IVPB  Status:  Discontinued     100 mg 200 mL/hr over 30 Minutes Intravenous Every 1 hr x 2 01/11/20 1942 01/11/20 1944   01/11/20 2000   azithromycin (ZITHROMAX) 500 mg in sodium chloride 0.9 % 250 mL IVPB  Status:  Discontinued     500 mg 250 mL/hr over 60 Minutes Intravenous Every 24 hours 01/11/20 1903 01/12/20 1423   01/11/20 2000  remdesivir 200 mg in sodium chloride 0.9% 250 mL IVPB     200 mg 580 mL/hr over 30 Minutes Intravenous Once 01/11/20 1945 01/11/20 2130   01/11/20 1730  vancomycin (VANCOCIN) 2,500 mg in sodium chloride 0.9 % 500 mL IVPB     2,500 mg 250 mL/hr over 120 Minutes Intravenous  Once 01/11/20 1657 01/11/20 2005   01/11/20 1715  ceFEPIme (MAXIPIME) 1 g in sodium chloride 0.9 % 100 mL IVPB     1 g 200 mL/hr over 30 Minutes Intravenous  Once 01/11/20 1652 01/11/20 1806       Time Spent in minutes  30   Lala Lund M.D on 01/20/2020 at 11:21 AM  To page go to [email or web address] - password [facility]  [facility] -  Office  [phone]  See all Orders from today for further details    Objective:   Vitals:   01/19/20 2143 01/20/20 0541 01/20/20 0757 01/20/20 0857  BP: 97/77 (!) 95/28 (!) 145/74 (!) 145/74  Pulse:   72   Resp:      Temp: 98.1 F (36.7 C) 97.8 F (36.6 C) 97.6 F (36.4 C)   TempSrc: Oral Oral Oral   SpO2: 96% 96% 95%   Weight:      Height:        Wt Readings from Last 3 Encounters:  01/19/20 125.6 kg  01/11/20 128 kg  12/25/19 134.7 kg     Intake/Output Summary (Last 24 hours) at 01/20/2020 1121 Last data filed at 01/20/2020 0900 Gross per 24 hour  Intake 480 ml  Output --  Net 480 ml     Physical Exam  Awake Alert, No new F.N deficits, Normal affect [place].AT,PERRAL Supple Neck,No JVD, No cervical lymphadenopathy appriciated.  Symmetrical Chest wall movement, Good air movement bilaterally, CTAB RRR,No Gallops, Rubs or new Murmurs, No Parasternal Heave +ve B.Sounds, Abd Soft, No tenderness, No organomegaly appriciated, No rebound - guarding or rigidity. No Cyanosis,  new L. subclavian HD catheter       Data Review:    CBC  Recent Labs  Lab  01/16/20 0311 01/17/20 0310 01/18/20 0816 01/19/20 0446 01/20/20 0309  WBC 21.4* 23.9* 26.3* 19.3* 12.1*  HGB 10.4* 10.6* 11.1* 10.9* 10.5*  HCT 34.1* 34.9* 36.0* 37.0* 35.6*  PLT 277 257 219 238 203  MCV 75.8* 75.5* 76.1* 77.4* 78.2*  MCH 23.1* 22.9* 23.5* 22.8* 23.1*  MCHC 30.5 30.4 30.8 29.5* 29.5*  RDW 17.8* 18.1* 18.9* 19.6* 19.4*  LYMPHSABS 0.9 2.3 0.8 1.9 2.2  MONOABS 1.1* 1.8* 1.6* 0.6 0.8  EOSABS 0.0 0.1 0.0 0.2 0.5  BASOSABS 0.0 0.0 0.0 0.0 0.1    Chemistries  Recent Labs  Lab 01/16/20 0311 01/17/20 0310 01/18/20 0816 01/19/20 0446 01/20/20 0309  NA 132* 135 136 138 138  K 3.9 3.4* 4.6 3.8 4.1  CL 97* 99 100 100 102  CO2 18* 17* 15* 18* 17*  GLUCOSE 319* 262* 209* 149* 142*  BUN 77* 59* 82* 94* 95*  CREATININE 12.49* 10.94* 14.26* 16.56* 16.37*  CALCIUM 8.9 8.4* 9.0 8.5* 8.4*  MG 1.9 2.0 2.1 2.3 2.2  AST 9* 13* 13* 11* 10*  ALT '10 13 11 13 12  '$ ALKPHOS 98 98 78 78 90  BILITOT 0.6 0.5 0.4 0.5 0.6   ------------------------------------------------------------------------------------------------------------------ No results for input(s): CHOL, HDL, LDLCALC, TRIG, CHOLHDL, LDLDIRECT in the last 72 hours.  Lab Results  Component Value Date   HGBA1C 10.5 (H) 08/08/2018   ------------------------------------------------------------------------------------------------------------------ No results for input(s): TSH, T4TOTAL, T3FREE, THYROIDAB in the last 72 hours.  Invalid input(s): FREET3  Cardiac Enzymes No results for input(s): CKMB, TROPONINI, MYOGLOBIN in the last 168 hours.  Invalid input(s): CK ------------------------------------------------------------------------------------------------------------------    Component Value Date/Time   BNP 28.8 01/20/2020 0309    Micro Results Recent Results (from the past 240 hour(s))  Respiratory Panel by RT PCR (Flu A&B, Covid) - Nasopharyngeal Swab     Status: Abnormal   Collection Time: 01/11/20  4:15  PM   Specimen: Nasopharyngeal Swab  Result Value Ref Range Status   SARS Coronavirus 2 by RT PCR POSITIVE (A) NEGATIVE Final    Comment: RESULT CALLED TO, READ BACK BY AND VERIFIED WITH: Kathryne Sharper RN [phone] 01/11/20 JM (NOTE) SARS-CoV-2 target nucleic acids are DETECTED. SARS-CoV-2 RNA is generally detectable in upper respiratory specimens  during the acute phase of infection. Positive results are indicative of the presence of the identified virus, but do not rule out bacterial infection or co-infection with other pathogens not detected by the test. Clinical correlation with patient history and other diagnostic information is necessary to determine patient infection status. The expected result is Negative. Fact Sheet for Patients:  PinkCheek.be Fact Sheet for Healthcare Providers: GravelBags.it This test is not yet approved or cleared by the [place] FDA and  has been authorized for detection and/or diagnosis of SARS-CoV-2 by FDA under an Emergency Use Authorization (EUA).  This EUA will remain in effect (meaning this test can be used) for th e duration of  the COVID-19 declaration under Section 564(b)(1) of the Act, 21 U.S.C. section 360bbb-3(b)(1), unless the authorization is terminated or revoked sooner.    Influenza A by PCR NEGATIVE NEGATIVE Final   Influenza B by PCR NEGATIVE NEGATIVE Final    Comment: (NOTE) The Xpert Xpress SARS-CoV-2/FLU/RSV assay is intended as an aid in  the diagnosis of influenza from Nasopharyngeal swab specimens and  should not be used as a sole basis for treatment. Nasal washings and  aspirates are unacceptable for Xpert Xpress SARS-CoV-2/FLU/RSV  testing. Fact Sheet  for Patients: PinkCheek.be Fact Sheet for Healthcare Providers: GravelBags.it This test is not yet approved or cleared by the [place] FDA and  has been  authorized for detection and/or diagnosis of SARS-CoV-2 by  FDA under an Emergency Use Authorization (EUA). This EUA will remain  in effect (meaning this test can be used) for the duration of the  Covid-19 declaration under Section 564(b)(1) of the Act, 21  U.S.C. section 360bbb-3(b)(1), unless the authorization is  terminated or revoked. Performed at [facility], [place] [address] [place], [place] [address]   Culture, blood (routine x 2)     Status: Abnormal   Collection Time: 01/11/20  4:52 PM   Specimen: BLOOD  Result Value Ref Range Status   Specimen Description   Final    BLOOD RIGHT ANTECUBITAL Performed at [place] [address] [place], [place] [address]    Special Requests   Final    BOTTLES DRAWN AEROBIC AND ANAEROBIC Blood Culture adequate volume Performed at [place] [address] [place], [place] [address]    Culture  Setup Time   Final    GRAM POSITIVE COCCI IN CHAINS AEROBIC BOTTLE ONLY CRITICAL RESULT CALLED TO, READ BACK BY AND VERIFIED WITH: PHARMD L CHEN [id number] AT 1011 BY CM Performed at [facility], [place] [address], [place], [place] [address]    Culture STREPTOCOCCUS PNEUMONIAE (A)  Final   Report Status 01/14/2020 FINAL  Final   Organism ID, Bacteria STREPTOCOCCUS PNEUMONIAE  Final      Susceptibility   Streptococcus pneumoniae - MIC*    ERYTHROMYCIN <=0.12 SENSITIVE Sensitive     LEVOFLOXACIN 0.5 SENSITIVE Sensitive     VANCOMYCIN 0.5 SENSITIVE Sensitive     PENICILLIN (meningitis) 0.12 RESISTANT Resistant     PENO - penicillin 0.12      PENICILLIN (non-meningitis) 0.12 SENSITIVE Sensitive     PENICILLIN (oral) 0.12 INTERMEDIATE Intermediate     CEFTRIAXONE (non-meningitis) <=0.12 SENSITIVE Sensitive     CEFTRIAXONE (meningitis) <=0.12 SENSITIVE Sensitive     * STREPTOCOCCUS PNEUMONIAE  Culture, blood (routine x 2)     Status: None   Collection Time: 01/11/20  5:46 PM    Specimen: BLOOD LEFT HAND  Result Value Ref Range Status   Specimen Description   Final    BLOOD LEFT HAND Performed at [place] [address], [place], [place] [address]    Special Requests   Final    BOTTLES DRAWN AEROBIC ONLY Blood Culture adequate volume Performed at [place] [address] [place], [place] [address]    Culture   Final    NO GROWTH 5 DAYS Performed at [facility], [place] [address] [place], [place] [address]    Report Status 01/16/2020 FINAL  Final  MRSA PCR Screening     Status: None   Collection Time: 01/12/20  2:28 AM   Specimen: Nasal Mucosa; Nasopharyngeal  Result Value Ref Range Status   MRSA by PCR NEGATIVE NEGATIVE Final    Comment:        The GeneXpert MRSA Assay (FDA approved for NASAL specimens only), is one component of a comprehensive MRSA colonization surveillance program. It is not intended to diagnose MRSA infection nor to guide or monitor treatment for MRSA infections. Performed at [facility], [place] [address], [place],  [address]   Culture, blood (routine x 2)     Status: None  Collection Time: 01/14/20 12:10 PM   Specimen: BLOOD  Result Value Ref Range Status   Specimen Description BLOOD LEFT ANTECUBITAL  Final   Special Requests   Final    BOTTLES DRAWN AEROBIC ONLY Blood Culture results may not be optimal due to an inadequate volume of blood received in culture bottles   Culture   Final    NO GROWTH 5 DAYS Performed at [facility], [place] [address], [place], [place] [address]    Report Status 01/19/2020 FINAL  Final  Culture, blood (routine x 2)     Status: None   Collection Time: 01/14/20 12:13 PM   Specimen: BLOOD LEFT HAND  Result Value Ref Range Status   Specimen Description BLOOD LEFT HAND  Final   Special Requests   Final    BOTTLES DRAWN AEROBIC ONLY Blood Culture results may not be optimal due to an inadequate volume of blood received in culture  bottles   Culture   Final    NO GROWTH 5 DAYS Performed at [facility], [place] [address] [place], [place] [address]    Report Status 01/19/2020 FINAL  Final    Radiology Reports CT ABDOMEN PELVIS WO CONTRAST  Result Date: 01/13/2020 CLINICAL DATA:  Follow-up of COVID pneumonia. Lung nodule on chest radiograph. Diffuse abdominal pain. EXAM: CT CHEST, ABDOMEN AND PELVIS WITHOUT CONTRAST TECHNIQUE: Multidetector CT imaging of the chest, abdomen and pelvis was performed following the standard protocol without IV contrast. COMPARISON:  Chest radiograph 01/12/2020. CT of the abdomen and pelvis of 12/05/2018. CTA of the chest of 03/04/2017. FINDINGS: CT CHEST FINDINGS Cardiovascular: A left sided central line terminates in the right atrium. Aortic atherosclerosis. Tortuous thoracic aorta. Moderate cardiomegaly, without pericardial effusion. Multivessel coronary artery atherosclerosis. Pulmonary artery enlargement, outflow tract 3.6 cm. 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 Mediastinum/Nodes: Right paratracheal node of 1.1 cm Hilar regions poorly evaluated without intravenous contrast. Lungs/Pleura: No pleural fluid.  Mild centrilobular emphysema. Right greater than left peripheral upper lobe airspace disease with mild surrounding ground-glass. Musculoskeletal: No acute osseous abnormality. CT ABDOMEN PELVIS FINDINGS Hepatobiliary: Hepatomegaly at 19.2 cm craniocaudal. Hepatic morphology, including caudate lobe enlargement and irregular hepatic capsule, highly suspicious for mild cirrhosis. Normal gallbladder, without biliary ductal dilatation. Pancreas: Normal, without mass or ductal dilatation. Spleen: Normal in size, without focal abnormality. Adrenals/Urinary Tract: Mild bilateral adrenal thickening. Native renal atrophy on the left. Right nephrectomy. Multiple low-density left renal lesions are likely cysts. No bladder calculi. Stomach/Bowel: Normal stomach, without  wall thickening. Scattered colonic diverticula. Wall thickening within the sigmoid with mild surrounding edema, including on 95/3. No pericolonic fluid collection or free perforation. Separate area of pericolonic edema posterior to the ascending colon including on 84/3. Vascular/Lymphatic: Aortic and branch vessel atherosclerosis. Reproductive: No abdominopelvic adenopathy.  Normal prostate. Other: Fat containing left inguinal hernia. No significant free fluid. Tiny periumbilical fat containing ventral abdominal wall laxity. Musculoskeletal: Renal osteodystrophy.  Lumbosacral spondylosis. IMPRESSION: CT CHEST IMPRESSION 1. Right greater than left posterior upper lobe airspace and ground-glass opacity. Most consistent with pneumonia. This is not atypical appearance of COVID-19 pneumonia, which cannot be excluded. Other infectious etiologies should also be considered. 2. Aortic atherosclerosis (ICD10-I70.0), coronary artery atherosclerosis and emphysema (ICD10-J43.9). 3. Mild thoracic adenopathy, likely reactive. 4. Pulmonary artery enlargement suggests pulmonary arterial hypertension. CT ABDOMEN AND PELVIS IMPRESSION 1. Sigmoid wall thickening and pericolonic edema, likely related to non complicated diverticulitis. Separate edema posterior to the ascending colon is nonspecific and could  represent an area of colitis or mild diverticulitis. 2. Hepatomegaly. Hepatic morphology, highly suspicious for cirrhosis. Electronically Signed   By: Abigail Miyamoto M.D.   On: 01/13/2020 12:36   CT CHEST WO CONTRAST  Result Date: 01/13/2020 CLINICAL DATA:  Follow-up of COVID pneumonia. Lung nodule on chest radiograph. Diffuse abdominal pain. EXAM: CT CHEST, ABDOMEN AND PELVIS WITHOUT CONTRAST TECHNIQUE: Multidetector CT imaging of the chest, abdomen and pelvis was performed following the standard protocol without IV contrast. COMPARISON:  Chest radiograph 01/12/2020. CT of the abdomen and pelvis of 12/05/2018. CTA of the chest of  03/04/2017. FINDINGS: CT CHEST FINDINGS Cardiovascular: A left sided central line terminates in the right atrium. Aortic atherosclerosis. Tortuous thoracic aorta. Moderate cardiomegaly, without pericardial effusion. Multivessel coronary artery atherosclerosis. Pulmonary artery enlargement, outflow tract 3.6 cm. 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 Mediastinum/Nodes: Right paratracheal node of 1.1 cm Hilar regions poorly evaluated without intravenous contrast. Lungs/Pleura: No pleural fluid.  Mild centrilobular emphysema. Right greater than left peripheral upper lobe airspace disease with mild surrounding ground-glass. Musculoskeletal: No acute osseous abnormality. CT ABDOMEN PELVIS FINDINGS Hepatobiliary: Hepatomegaly at 19.2 cm craniocaudal. Hepatic morphology, including caudate lobe enlargement and irregular hepatic capsule, highly suspicious for mild cirrhosis. Normal gallbladder, without biliary ductal dilatation. Pancreas: Normal, without mass or ductal dilatation. Spleen: Normal in size, without focal abnormality. Adrenals/Urinary Tract: Mild bilateral adrenal thickening. Native renal atrophy on the left. Right nephrectomy. Multiple low-density left renal lesions are likely cysts. No bladder calculi. Stomach/Bowel: Normal stomach, without wall thickening. Scattered colonic diverticula. Wall thickening within the sigmoid with mild surrounding edema, including on 95/3. No pericolonic fluid collection or free perforation. Separate area of pericolonic edema posterior to the ascending colon including on 84/3. Vascular/Lymphatic: Aortic and branch vessel atherosclerosis. Reproductive: No abdominopelvic adenopathy.  Normal prostate. Other: Fat containing left inguinal hernia. No significant free fluid. Tiny periumbilical fat containing ventral abdominal wall laxity. Musculoskeletal: Renal osteodystrophy.  Lumbosacral spondylosis. IMPRESSION: CT CHEST IMPRESSION 1. Right greater  than left posterior upper lobe airspace and ground-glass opacity. Most consistent with pneumonia. This is not atypical appearance of COVID-19 pneumonia, which cannot be excluded. Other infectious etiologies should also be considered. 2. Aortic atherosclerosis (ICD10-I70.0), coronary artery atherosclerosis and emphysema (ICD10-J43.9). 3. Mild thoracic adenopathy, likely reactive. 4. Pulmonary artery enlargement suggests pulmonary arterial hypertension. CT ABDOMEN AND PELVIS IMPRESSION 1. Sigmoid wall thickening and pericolonic edema, likely related to non complicated diverticulitis. Separate edema posterior to the ascending colon is nonspecific and could represent an area of colitis or mild diverticulitis. 2. Hepatomegaly. Hepatic morphology, highly suspicious for cirrhosis. Electronically Signed   By: Abigail Miyamoto M.D.   On: 01/13/2020 12:36   IR Removal Tun Cv Cath W/O FL  Result Date: 01/16/2020 INDICATION: Streptococcus pneumoniae bacteremia. Request for removal of tunneled hemodialysis catheter. Initially placed at an outside facility. EXAM: REMOVAL OF TUNNELED HEMODIALYSIS CATHETER MEDICATIONS: 1% lidocaine with epinephrine 4 mL COMPLICATIONS: None immediate. PROCEDURE: Informed written consent was obtained from the patient following an explanation of the procedure, risks, benefits and alternatives to treatment. A time out was performed prior to the initiation of the procedure. Maximal barrier sterile technique was utilized including caps, mask, sterile gowns, sterile gloves, large sterile drape, hand hygiene, and Betadine. 1% lidocaine with epinephrine was injected under sterile conditions along the subcutaneous tunnel. Utilizing a combination of blunt dissection and gentle traction, the catheter was removed intact. Hemostasis was obtained with manual compression. A dressing was placed. The patient tolerated the procedure well  without immediate post procedural complication. IMPRESSION: Successful removal of  tunneled dialysis catheter. Read by: Gareth Eagle, PA-C Electronically Signed   By: Jacqulynn Cadet M.D.   On: 01/16/2020 14:54   DG Chest Port 1 View  Result Date: 01/14/2020 CLINICAL DATA:  Shortness of breath, COVID positive EXAM: PORTABLE CHEST 1 VIEW COMPARISON:  01/12/2020 FINDINGS: Left dialysis catheter remains in place, unchanged. Patchy airspace disease in the right upper lobe again noted. Overall, airspace disease throughout the right lung has improved. No confluent opacity on the left. No effusions. No acute bony abnormality. Heart is borderline in size. IMPRESSION: Improving airspace disease with mild residual patchy right upper lobe opacities. Electronically Signed   By: Rolm Baptise M.D.   On: 01/14/2020 11:33   DG Chest Port 1 View  Result Date: 01/12/2020 CLINICAL DATA:  Short of breath EXAM: PORTABLE CHEST 1 VIEW COMPARISON:  01/11/2020 FINDINGS: Right upper lobe airspace disease unchanged. Improved aeration in the lung bases with improved lung volume. Negative for edema or effusion. Central venous catheter tip in the right atrium unchanged. IMPRESSION: Persistent right upper lobe airspace disease unchanged Improved aeration lungs with decrease in bibasilar atelectasis. Electronically Signed   By: Franchot Gallo M.D.   On: 01/12/2020 09:16   DG Chest Port 1 View  Result Date: 01/11/2020 CLINICAL DATA:  Shortness of breath EXAM: PORTABLE CHEST 1 VIEW COMPARISON:  01/10/2019 FINDINGS: There is bilateral diffuse interstitial thickening. There is a nodular appearance along the right suprahilar region likely reflecting prominent pulmonary vasculature or airspace disease. There is no pleural effusion or pneumothorax. The heart mediastinum are stable. There is a large bore left-sided central venous catheter with the tip projecting over the cavoatrial junction. There is no acute osseous abnormality. IMPRESSION: Diffuse bilateral interstitial thickening likely reflecting interstitial edema.  Nodular appearance along the right suprahilar region likely reflecting prominent pulmonary vasculature or airspace disease. Electronically Signed   By: Kathreen Devoid   On: 01/11/2020 16:31   DG Chest Port 1 View  Result Date: 01/11/2020 CLINICAL DATA:  Cough, short of breath EXAM: PORTABLE CHEST 1 VIEW COMPARISON:  03/07/2017 FINDINGS: Single frontal view of the chest demonstrates a left internal jugular dialysis catheter tip overlying atrial caval junction. Vascular stent overlies the right axilla. Cardiac silhouette is stable. There is a 7.3 cm mass projecting over the right anterior first and second ribs. CT chest with IV contrast recommended for suspected neoplasm. There is central vascular congestion with diffuse interstitial prominence consistent with mild fluid overload. Right hilar prominence could also reflect lymphadenopathy. No effusion or pneumothorax. No acute bony abnormality. IMPRESSION: 1. 7.3 cm right upper lobe mass consistent with neoplasm. CT chest with IV contrast is recommended. 2. Right hilar prominence could reflect underlying adenopathy. 3. Vascular congestion and interstitial edema. Electronically Signed   By: Randa Ngo M.D.   On: 01/11/2020 04:02   VAS [place] UPPER EXTREMITY ARTERIAL DUPLEX  Result Date: 12/25/2019 UPPER EXTREMITY DUPLEX STUDY Indications: Pre-operative exam.  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 Bilateral testing is considered an integral part of a complete examination. Limited examinations for reoccurring indications may be performed as noted.  Right Pre-Dialysis Findings: +-----------------------+----------+--------------------+---------+--------+ Location  PSV (cm/s)Intralum. Diam. (cm)Waveform Comments +-----------------------+----------+--------------------+---------+--------+ Brachial Antecub. fossa40        0.46                triphasic         +-----------------------+----------+--------------------+---------+--------+ Radial Art at Wrist    35        0.29                triphasic         +-----------------------+----------+--------------------+---------+--------+ Ulnar Art at Wrist     81        0.27                triphasic         +-----------------------+----------+--------------------+---------+--------+ +------------+-----------------+ Allen's TestBranches at fossa +------------+-----------------+ Left Pre-Dialysis Findings: +-----------------------+----------+--------------------+---------+--------+ Location               PSV (cm/s)Intralum. Diam. (cm)Waveform Comments +-----------------------+----------+--------------------+---------+--------+ Brachial Antecub. fossa54        0.52                triphasic         +-----------------------+----------+--------------------+---------+--------+ Radial Art at Wrist    44        0.27                triphasictortuous +-----------------------+----------+--------------------+---------+--------+ Ulnar Art at Wrist     73        0.19                triphasic         +-----------------------+----------+--------------------+---------+--------+ +------------+-----------------+ Allen's TestBranches at fossa +------------+-----------------+  Summary:   Measurements above. *See table(s) above for measurements and observations. Electronically signed by Curt Jews MD on 12/25/2019 at 9:30:15 AM.    Final    VAS [place] UPPER EXTREMITY VEIN MAPPING  Result Date: 12/25/2019 UPPER EXTREMITY VEIN MAPPING  Indications: Pre-access. History: History of right Radiocephalic fistula years ago.  Performing Technologist: Alvia Grove RVT  Examination Guidelines: A complete evaluation  includes B-mode imaging, spectral Doppler, color Doppler, and power Doppler as needed of all accessible portions of each vessel. Bilateral testing is considered an integral part of a complete examination. Limited examinations for reoccurring indications may be performed as noted. +-----------------+-------------+----------+--------+ Right Cephalic   Diameter (cm)Depth (cm)Findings +-----------------+-------------+----------+--------+ Prox upper arm                             [place]    +-----------------+-------------+----------+--------+ Mid upper arm                              [place]    +-----------------+-------------+----------+--------+ Dist upper arm                             [place]    +-----------------+-------------+----------+--------+ Antecubital fossa                          [place]    +-----------------+-------------+----------+--------+ +-----------------+-------------+----------+---------+ Right Basilic    Diameter (cm)Depth (cm)Findings  +-----------------+-------------+----------+---------+ Prox upper arm       0.46                         +-----------------+-------------+----------+---------+ Mid upper arm        0.68  branching +-----------------+-------------+----------+---------+ Dist upper arm       0.34                         +-----------------+-------------+----------+---------+ Antecubital fossa    0.31                         +-----------------+-------------+----------+---------+ Prox forearm         0.27                         +-----------------+-------------+----------+---------+ +-----------------+-------------+----------+--------+ Left Cephalic    Diameter (cm)Depth (cm)Findings +-----------------+-------------+----------+--------+ Shoulder             0.47                        +-----------------+-------------+----------+--------+ Prox upper arm       0.36                         +-----------------+-------------+----------+--------+ Mid upper arm        0.41                        +-----------------+-------------+----------+--------+ Dist upper arm       0.43                        +-----------------+-------------+----------+--------+ Antecubital fossa    0.54                        +-----------------+-------------+----------+--------+ Prox forearm         0.43                        +-----------------+-------------+----------+--------+ Mid forearm          0.38                        +-----------------+-------------+----------+--------+ Dist forearm         0.33                        +-----------------+-------------+----------+--------+ +-----------------+-------------+----------+--------+ Left Basilic     Diameter (cm)Depth (cm)Findings +-----------------+-------------+----------+--------+ Prox upper arm       0.48                        +-----------------+-------------+----------+--------+ Mid upper arm        0.51                        +-----------------+-------------+----------+--------+ Dist upper arm       0.47                        +-----------------+-------------+----------+--------+ Antecubital fossa    0.26                        +-----------------+-------------+----------+--------+ Prox forearm         0.25                        +-----------------+-------------+----------+--------+ Summary: Right: Occluded right forearm fistula. Thrombus in the cephalic vein        from the antecubital fossa extending proximally into the  subclavian vein, appearing chronic. Left: Measurements above. *See table(s) above for measurements and observations.  Diagnosing physician: Curt Jews MD Electronically signed by Curt Jews MD on 12/25/2019 at 9:27:41 AM.    Final

## 2020-01-20 NOTE — Progress Notes (Signed)
[facility] NEPHROLOGY PROGRESS NOTE  Assessment/ Plan: Pt is a 64 y.o. yo male    Outpt HD:[facility], 5h 550/800 132kg 2/2 bath P4 AVG Hep 10K+ 2K midrunCalcitriol 3.25 ug tiw,sensipar 180 qhd,venofer 50 /wk,mirc 60 q 2, last 2/22  #COVID-19 pneumonia/acute hypoxic respiratory failure: On remdesivir, antibiotics per primary team.    # Streptococcal bacteremia: The HD catheter was exchanged and placed new left IJ TDC by IR on 3/5.  Cultures are negative.  Currently on Augmentin.  # ESRD MWF schedule: The dialysis scheduled was changed to TTS to align with outpatient schedule.  Yesterday he had a problem with tunnel catheter because of poor venous flow therefore TPA was placed and patient was returned to the floor.  He supposed to get dialysis yesterday evening however had to postpone because of other emergency case.  Discussed with on-call dialysis nurse today who reported that the patient can be done later today when other RN comes.  Potassium level is acceptable and patient is on room air.  He is not uremic.  I think it is okay to wait until today evening.  I have discussed this with the patient and Dr. Candiss Norse.  # Anemia of CKD:  Hb at goal.  Monitor hemoglobin.  Received Mircera on 2/22. Dose Aranesp today.  # Secondary hyperparathyroidism: phos high therefore increased auryxia dose. On Sensipar.  Monitor Ca, phos level.   # HTN/volume: Continue current antihypertensive medication and the UF during HD.  Subjective: Chart and lab results reviewed.  Sitting on chair comfortable.  Denies nausea vomiting chest pain shortness of breath.  No weakness, no tremor or dysgeusia.  Objective Vital signs in last 24 hours: Vitals:   01/19/20 2143 01/20/20 0541 01/20/20 0757 01/20/20 0857  BP: 97/77 (!) 95/28 (!) 145/74 (!) 145/74  Pulse:   72   Resp:      Temp: 98.1 F (36.7 C) 97.8 F (36.6 C) 97.6 F (36.4 C)   TempSrc: Oral Oral Oral   SpO2: 96% 96% 95%    Weight:      Height:       Weight change:   Intake/Output Summary (Last 24 hours) at 01/20/2020 1112 Last data filed at 01/20/2020 0900 Gross per 24 hour  Intake 480 ml  Output --  Net 480 ml       Labs: Basic Metabolic Panel: Recent Labs  Lab 01/16/20 0311 01/17/20 0310 01/17/20 1048 01/18/20 0816 01/19/20 0446 01/20/20 0309  NA 132*   < >  --  136 138 138  K 3.9   < >  --  4.6 3.8 4.1  CL 97*   < >  --  100 100 102  CO2 18*   < >  --  15* 18* 17*  GLUCOSE 319*   < >  --  209* 149* 142*  BUN 77*   < >  --  82* 94* 95*  CREATININE 12.49*   < >  --  14.26* 16.56* 16.37*  CALCIUM 8.9   < >  --  9.0 8.5* 8.4*  PHOS 6.6*  --  5.0*  --   --   --    < > = values in this interval not displayed.   Liver Function Tests: Recent Labs  Lab 01/18/20 0816 01/19/20 0446 01/20/20 0309  AST 13* 11* 10*  ALT 11 13 12   ALKPHOS 78 78 90  BILITOT 0.4 0.5 0.6  PROT 6.9 6.8 6.7  ALBUMIN 2.8* 2.8* 2.8*  No results for input(s): LIPASE, AMYLASE in the last 168 hours. No results for input(s): AMMONIA in the last 168 hours. CBC: Recent Labs  Lab 01/16/20 0311 01/16/20 0311 01/17/20 0310 01/17/20 0310 01/18/20 0816 01/19/20 0446 01/20/20 0309  WBC 21.4*   < > 23.9*   < > 26.3* 19.3* 12.1*  NEUTROABS 18.8*   < > 16.6*   < > 21.6* 16.6* 8.5*  HGB 10.4*   < > 10.6*   < > 11.1* 10.9* 10.5*  HCT 34.1*   < > 34.9*   < > 36.0* 37.0* 35.6*  MCV 75.8*  --  75.5*  --  76.1* 77.4* 78.2*  PLT 277   < > 257   < > 219 238 203   < > = values in this interval not displayed.   Cardiac Enzymes: No results for input(s): CKTOTAL, CKMB, CKMBINDEX, TROPONINI in the last 168 hours. CBG: Recent Labs  Lab 01/19/20 0751 01/19/20 1159 01/19/20 1619 01/19/20 2359 01/20/20 0753  GLUCAP 147* 176* 171* 168* 140*    Iron Studies:  No results for input(s): IRON, TIBC, TRANSFERRIN, FERRITIN in the last 72 hours. Studies/Results: No results found.  Medications: Infusions: . chlorproMAZINE  (THORAZINE) IV 25 mg (01/12/20 1949)    Scheduled Medications: . allopurinol  100 mg Oral BID  . amLODipine  5 mg Oral Daily  . amoxicillin-clavulanate  1 tablet Oral BID  . carvedilol  6.25 mg Oral BID WC  . Chlorhexidine Gluconate Cloth  6 each Topical Q0600  . cinacalcet  90 mg Oral Q M,W,F  . darbepoetin (ARANESP) injection - DIALYSIS  100 mcg Intravenous Q Sat-HD  . diphenhydrAMINE  25 mg Oral Q M,W,F  . ferric citrate  630 mg Oral BID WC  . furosemide  40 mg Oral Daily  . insulin aspart  0-15 Units Subcutaneous TID WC  . pantoprazole  40 mg Oral Daily  . pravastatin  20 mg Oral Daily    have reviewed scheduled and prn medications.  Physical Exam: General: Not in distress, comfortable. Heart:RRR, s1s2 nl, no rubs Lungs: Clear bilateral, no wheezing Abdomen:soft, Non-tender, non-distended Extremities: No LE edema Dialysis Access: Left IJ TDC.  Site looks clean Neurology: Alert, awake, no asterixis.  [name] [name] 01/20/2020,11:12 AM  LOS: 9 days  Pager: [phone]

## 2020-01-21 DIAGNOSIS — Z992 Dependence on renal dialysis: Secondary | ICD-10-CM | POA: Diagnosis not present

## 2020-01-21 DIAGNOSIS — E1129 Type 2 diabetes mellitus with other diabetic kidney complication: Secondary | ICD-10-CM | POA: Diagnosis not present

## 2020-01-21 DIAGNOSIS — U071 COVID-19: Secondary | ICD-10-CM | POA: Insufficient documentation

## 2020-01-21 DIAGNOSIS — N186 End stage renal disease: Secondary | ICD-10-CM | POA: Diagnosis not present

## 2020-01-21 LAB — GLUCOSE, CAPILLARY
Glucose-Capillary: 173 mg/dL — ABNORMAL HIGH (ref 70–99)
Glucose-Capillary: 174 mg/dL — ABNORMAL HIGH (ref 70–99)

## 2020-01-21 MED ORDER — AMOXICILLIN-POT CLAVULANATE 500-125 MG PO TABS: 1.0000 | ORAL_TABLET | Freq: Two times a day (BID) | ORAL | 0 refills | Status: AC

## 2020-01-21 MED ORDER — CARVEDILOL 3.125 MG PO TABS: 3.1250 mg | ORAL_TABLET | Freq: Two times a day (BID) | ORAL | 11 refills | Status: DC

## 2020-01-21 NOTE — Discharge Instructions (Signed)
Follow with Primary MD [name], MD in 7 days   Get CBC, CMP, 2 view Chest X ray -  checked next visit within 1 week by Primary MD   Activity: As tolerated with Full fall precautions use walker/cane & assistance as needed  Disposition Home   Diet: Renal - -low carbohydrate.  Accuchecks 4 times/day, Once in AM empty stomach and then before each meal. Log in all results and show them to your Prim.MD in 3 days. If any glucose reading is under 80 or above 300 call your Prim MD immidiately. Follow Low glucose instructions for glucose under 80 as instructed.   Special Instructions: If you have smoked or chewed Tobacco  in the last 2 yrs please stop smoking, stop any regular Alcohol  and or any Recreational drug use.  On your next visit with your primary care physician please Get Medicines reviewed and adjusted.  Please request your Prim.MD to go over all Hospital Tests and Procedure/Radiological results at the follow up, please get all Hospital records sent to your Prim MD by signing hospital release before you go home.  If you experience worsening of your admission symptoms, develop shortness of breath, life threatening emergency, suicidal or homicidal thoughts you must seek medical attention immediately by calling 911 or calling your MD immediately  if symptoms less severe.  You Must read complete instructions/literature along with all the possible adverse reactions/side effects for all the Medicines you take and that have been prescribed to you. Take any new Medicines after you have completely understood and accpet all the possible adverse reactions/side effects.        Person Under Monitoring Name: [name]  Location: [place] [address]   Infection Prevention Recommendations for Individuals Confirmed to have, or Being Evaluated for, 2019 Novel Coronavirus (COVID-19) Infection Who Receive Care at Home  Individuals who are confirmed to have, or are  being evaluated for, COVID-19 should follow the prevention steps below until a healthcare provider or local or state health department says they can return to normal activities.  Stay home except to get medical care You should restrict activities outside your home, except for getting medical care. Do not go to work, school, or public areas, and do not use public transportation or taxis.  Call ahead before visiting your doctor Before your medical appointment, call the healthcare provider and tell them that you have, or are being evaluated for, COVID-19 infection. This will help the healthcare provider's office take steps to keep other people from getting infected. Ask your healthcare provider to call the local or state health department.  Monitor your symptoms Seek prompt medical attention if your illness is worsening (e.g., difficulty breathing). Before going to your medical appointment, call the healthcare provider and tell them that you have, or are being evaluated for, COVID-19 infection. Ask your healthcare provider to call the local or state health department.  Wear a facemask You should wear a facemask that covers your nose and mouth when you are in the same room with other people and when you visit a healthcare provider. People who live with or visit you should also wear a facemask while they are in the same room with you.  Separate yourself from other people in your home As much as possible, you should stay in a different room from other people in your home. Also, you should use a separate bathroom, if available.  Avoid sharing household items You should not share dishes, drinking  glasses, cups, eating utensils, towels, bedding, or other items with other people in your home. After using these items, you should wash them thoroughly with soap and water.  Cover your coughs and sneezes Cover your mouth and nose with a tissue when you cough or sneeze, or you can cough or sneeze into  your sleeve. Throw used tissues in a lined trash can, and immediately wash your hands with soap and water for at least 20 seconds or use an alcohol-based hand rub.  Wash your [facility] your hands often and thoroughly with soap and water for at least 20 seconds. You can use an alcohol-based hand sanitizer if soap and water are not available and if your hands are not visibly dirty. Avoid touching your eyes, nose, and mouth with unwashed hands.   Prevention Steps for Caregivers and Household Members of Individuals Confirmed to have, or Being Evaluated for, COVID-19 Infection Being Cared for in the Home  If you live with, or provide care at home for, a person confirmed to have, or being evaluated for, COVID-19 infection please follow these guidelines to prevent infection:  Follow healthcare provider's instructions Make sure that you understand and can help the patient follow any healthcare provider instructions for all care.  Provide for the patient's basic needs You should help the patient with basic needs in the home and provide support for getting groceries, prescriptions, and other personal needs.  Monitor the patient's symptoms If they are getting sicker, call his or her medical provider and tell them that the patient has, or is being evaluated for, COVID-19 infection. This will help the healthcare provider's office take steps to keep other people from getting infected. Ask the healthcare provider to call the local or state health department.  Limit the number of people who have contact with the patient  If possible, have only one caregiver for the patient.  Other household members should stay in another home or place of residence. If this is not possible, they should stay  in another room, or be separated from the patient as much as possible. Use a separate bathroom, if available.  Restrict visitors who do not have an essential need to be in the home.  Keep older adults, very  young children, and other sick people away from the patient Keep older adults, very young children, and those who have compromised immune systems or chronic health conditions away from the patient. This includes people with chronic heart, lung, or kidney conditions, diabetes, and cancer.  Ensure good ventilation Make sure that shared spaces in the home have good air flow, such as from an air conditioner or an opened window, weather permitting.  Wash your hands often  Wash your hands often and thoroughly with soap and water for at least 20 seconds. You can use an alcohol based hand sanitizer if soap and water are not available and if your hands are not visibly dirty.  Avoid touching your eyes, nose, and mouth with unwashed hands.  Use disposable paper towels to dry your hands. If not available, use dedicated cloth towels and replace them when they become wet.  Wear a facemask and gloves  Wear a disposable facemask at all times in the room and gloves when you touch or have contact with the patient's blood, body fluids, and/or secretions or excretions, such as sweat, saliva, sputum, nasal mucus, vomit, urine, or feces.  Ensure the mask fits over your nose and mouth tightly, and do not touch it during use.  Throw out disposable facemasks and gloves after using them. Do not reuse.  Wash your hands immediately after removing your facemask and gloves.  If your personal clothing becomes contaminated, carefully remove clothing and launder. Wash your hands after handling contaminated clothing.  Place all used disposable facemasks, gloves, and other waste in a lined container before disposing them with other household waste.  Remove gloves and wash your hands immediately after handling these items.  Do not share dishes, glasses, or other household items with the patient  Avoid sharing household items. You should not share dishes, drinking glasses, cups, eating utensils, towels, bedding, or other  items with a patient who is confirmed to have, or being evaluated for, COVID-19 infection.  After the person uses these items, you should wash them thoroughly with soap and water.  Wash laundry thoroughly  Immediately remove and wash clothes or bedding that have blood, body fluids, and/or secretions or excretions, such as sweat, saliva, sputum, nasal mucus, vomit, urine, or feces, on them.  Wear gloves when handling laundry from the patient.  Read and follow directions on labels of laundry or clothing items and detergent. In general, wash and dry with the warmest temperatures recommended on the label.  Clean all areas the individual has used often  Clean all touchable surfaces, such as counters, tabletops, doorknobs, bathroom fixtures, toilets, phones, keyboards, tablets, and bedside tables, every day. Also, clean any surfaces that may have blood, body fluids, and/or secretions or excretions on them.  Wear gloves when cleaning surfaces the patient has come in contact with.  Use a diluted bleach solution (e.g., dilute bleach with 1 part bleach and 10 parts water) or a household disinfectant with a label that says EPA-registered for coronaviruses. To make a bleach solution at home, add 1 tablespoon of bleach to 1 quart (4 cups) of water. For a larger supply, add  cup of bleach to 1 gallon (16 cups) of water.  Read labels of cleaning products and follow recommendations provided on product labels. Labels contain instructions for safe and effective use of the cleaning product including precautions you should take when applying the product, such as wearing gloves or eye protection and making sure you have good ventilation during use of the product.  Remove gloves and wash hands immediately after cleaning.  Monitor yourself for signs and symptoms of illness Caregivers and household members are considered close contacts, should monitor their health, and will be asked to limit movement outside of  the home to the extent possible. Follow the monitoring steps for close contacts listed on the symptom monitoring form.   ? If you have additional questions, contact your local health department or call the epidemiologist on call at [phone] (available 24/7). ? This guidance is subject to change. For the most up-to-date guidance from [facility], please refer to their website: [email or web address]

## 2020-01-21 NOTE — Discharge Summary (Signed)
[name] YSH:[id number] DOB: 1956/05/27 DOA: 01/11/2020  PCP: [name], MD  Admit date: 01/11/2020  Discharge date: 01/21/2020  Admitted From: Home   Disposition:  Home   Recommendations for Outpatient Follow-up:   Follow up with PCP in 1-2 weeks  PCP Please obtain BMP/CBC, 2 view CXR in 1week,  (see Discharge instructions)   PCP Please follow up on the following pending results: Needs 2 view chest x-ray repeated in 7 to 10 days.   Home Health: None   Equipment/Devices: None  Consultations: Renal, ID - phone Discharge Condition: Stable    CODE STATUS: Full    Diet Recommendation: Renal diet low carbohydrate  Diet Order            Diet heart healthy/carb modified Room service appropriate? Yes; Fluid consistency: Thin  Diet effective now               Chief Complaint  Patient presents with  . Emesis  . Shortness of Breath  . Sore Throat     Brief history of present illness from the day of admission and additional interim summary    [name] a 64 y.o.malewith medical history significant forHTN, OSA, chronic Hep C, Type 2 diabetes and ESRD on HDMW/Fwho presents with concerns of "not feeling well."  His work-up suggested pneumonia with chest x-ray showing right upper lobe mass suspicious for malignancy.  He also had COVID-19 infection and was admitted to the hospital.                                                                 Hospital Course      1. Acute Hypoxic Resp. Failure, positive COVID-19 infection and possible mild pneumonia, also newly diagnosed right upper lobe mass suspicious VS CAP with Strep pneumonae bacteremia -   I think his main issue was sepsis from right upper lobe bacterial pneumonia with strep bacteremia, he was extremely toxic and sick, procalcitonin  and CRP were both elevated, he has finished his Covid specific treatment and now being treated for bacterial infection and bacterial pneumonia, old dialysis catheter was removed and a new catheter after a days of line holiday was placed.  His repeat blood cultures are negative and he will be discharged on oral antibiotics with finish date of 01/26/2020.  Case was discussed with ID physician Dr. Graylon Good.  He is now symptom-free.  Note  request PCP to repeat two-view chest x-ray in 7 to 10 days to rule out right upper lobe mass.      Recent Labs  Lab 01/16/20 0311 01/17/20 0310 01/18/20 0816 01/19/20 0446 01/20/20 0309  CRP 8.2* 5.5* 4.2* 3.5* 2.0*  DDIMER 2.47* 2.51* 2.07* 2.39* 3.33*  BNP 154.1* 71.6 76.2 41.1 28.8  PROCALCITON 9.22 6.57 4.45 3.65 2.67  Hepatic Function Latest Ref Rng & Units 01/20/2020 01/19/2020 01/18/2020  Total Protein 6.5 - 8.1 g/dL 6.7 6.8 6.9  Albumin 3.5 - 5.0 g/dL 2.8(L) 2.8(L) 2.8(L)  AST 15 - 41 U/L 10(L) 11(L) 13(L)  ALT 0 - 44 U/L '12 13 11  '$ Alk Phosphatase 38 - 126 U/L 90 78 78  Total Bilirubin 0.3 - 1.2 mg/dL 0.6 0.5 0.4  Bilirubin, Direct 0.0 - 0.2 mg/dL - - -    2.  Right upper lobe mass, history of right sided nephrectomy due to renal cell cancer in the past, history of smoking .  New diagnosis.  Her on CT more suspicious of infiltrate hence complete antibiotic course and then repeat imaging in 2 to 3 weeks by PCP if question persists outpatient pulmonary follow-up.  3.  ESRD, Monday Wednesday Friday schedule.  Seen by nephrology will follow outpatient with nephrology group as before.  4.  Anemia of chronic disease.  Hemoglobin close to 8.  Will monitor.  No need for transfusion.  5.  Hyponatremia and hypernatremia.  Management per nephrology and HD.  6.  OSA.  Supplemental oxygen at night, does not use CPAP.  7.  Dyslipidemia.  On statin.  8.  Essential hypertension.    Pressure soft here has been discharged on low-dose Coreg to be started  from tomorrow along with home Lasix.  Norvasc stopped.  9.  Morbid obesity.  BMI 37.  Follow with PCP.  10. Left foot Charcot joint, right lower extremity shallow 1 cm noninfected ulcer present on admission.  Supportive care.   11.  Possible asymptomatic diverticulitis noted on CT scan.    Has been adequately treated for it,.  Symptom-free.  PCP may arrange for outpatient GI follow-up for eventual colonoscopy in 1 to 2 weeks..   12.  DM type II.  SSI for now.  Poor outpatient glycemic control due to hyperglycemia, A1c of 10.5.  On home regimen follow with PCP for glycemic control.   Discharge diagnosis     Principal Problem:   Pneumonia due to COVID-19 virus Active Problems:   Hypertension   Diabetes mellitus ([place])   End stage renal disease (H[facility])   Sleep apnea   ESRD (end stage renal disease) on dialysis (HCC)   Hyponatremia   Hyperkalemia   Anemia   Respiratory failure with hypoxia ([facility])   BMI 37.0-37.9, adult   Pressure injury of skin    Discharge instructions    Discharge Instructions    Discharge instructions   Complete by: As directed    Follow with Primary MD [name], MD in 7 days   Get CBC, CMP, 2 view Chest X ray -  checked next visit within 1 week by Primary MD   Activity: As tolerated with Full fall precautions use walker/cane & assistance as needed  Disposition Home   Diet: Renal - -low carbohydrate.  Accuchecks 4 times/day, Once in AM empty stomach and then before each meal. Log in all results and show them to your Prim.MD in 3 days. If any glucose reading is under 80 or above 300 call your Prim MD immidiately. Follow Low glucose instructions for glucose under 80 as instructed.   Special Instructions: If you have smoked or chewed Tobacco  in the last 2 yrs please stop smoking, stop any regular Alcohol  and or any Recreational drug use.  On your next visit with your primary care physician please Get Medicines reviewed and adjusted.  Please  request your Prim.MD to  go over all Hospital Tests and Procedure/Radiological results at the follow up, please get all Hospital records sent to your Prim MD by signing hospital release before you go home.  If you experience worsening of your admission symptoms, develop shortness of breath, life threatening emergency, suicidal or homicidal thoughts you must seek medical attention immediately by calling 911 or calling your MD immediately  if symptoms less severe.  You Must read complete instructions/literature along with all the possible adverse reactions/side effects for all the Medicines you take and that have been prescribed to you. Take any new Medicines after you have completely understood and accpet all the possible adverse reactions/side effects.   Increase activity slowly   Complete by: As directed    MyChart COVID-19 home monitoring program   Complete by: Jan 21, 2020    Is the patient willing to use the [place] for home monitoring?: Yes   Temperature monitoring   Complete by: Jan 21, 2020    After how many days would you like to receive a notification of this patient's flowsheet entries?: 1      Discharge Medications   Allergies as of 01/21/2020      Reactions   Penicillins Nausea And Vomiting, Other (See Comments)   Tolerated Augmentin. Has patient had a PCN reaction causing immediate rash, facial/tongue/throat swelling, SOB or lightheadedness with hypotension: No Has patient had a PCN reaction causing severe rash involving mucus membranes or skin necrosis: No Has patient had a PCN reaction that required hospitalization No Has patient had a PCN reaction occurring within the last 10 years: No If all of the above answers are "NO", then may proceed with Cephalosporin use.   Propolis Nausea And Vomiting   Vioxx [rofecoxib] Nausea And Vomiting      Medication List    STOP taking these medications   amLODipine 5 MG tablet Commonly known as: NORVASC   clotrimazole 1 %  cream Commonly known as: LOTRIMIN   HYDROcodone-acetaminophen 5-325 MG tablet Commonly known as: Norco     TAKE these medications   acetaminophen 325 MG tablet Commonly known as: TYLENOL Take 650 mg by mouth every 6 (six) hours as needed for mild pain, moderate pain, fever or headache.   allopurinol 100 MG tablet Commonly known as: ZYLOPRIM Take 1 tablet (100 mg total) by mouth daily. What changed: when to take this   amoxicillin-clavulanate 500-125 MG tablet Commonly known as: Augmentin Take 1 tablet (500 mg total) by mouth 2 (two) times daily for 5 days. Take through 01/20/2020   Auryxia 1 GM 210 MG(Fe) tablet Generic drug: ferric citrate Take 420 mg by mouth 2 (two) times daily with a meal.   carvedilol 3.125 MG tablet Commonly known as: Coreg Take 1 tablet (3.125 mg total) by mouth 2 (two) times daily. What changed:   medication strength  how much to take  when to take this   cinacalcet 90 MG tablet Commonly known as: SENSIPAR Take 90 mg by mouth every Monday, Wednesday, and Friday.   diphenhydrAMINE 25 MG tablet Commonly known as: BENADRYL Take 25 mg by mouth every Monday, Wednesday, and Friday.   furosemide 40 MG tablet Commonly known as: LASIX Take 40 mg by mouth daily.   Levemir FlexTouch 100 UNIT/ML FlexPen Generic drug: insulin detemir Inject 20 Units into the skin at bedtime.   mirtazapine 15 MG tablet Commonly known as: REMERON Take 15 mg by mouth at bedtime as needed (sleep).   omeprazole 20 MG capsule Commonly  known as: PRILOSEC Take 20 mg by mouth daily.   ondansetron 4 MG tablet Commonly known as: ZOFRAN Take 4 mg by mouth 3 (three) times daily as needed for nausea/vomiting.   pravastatin 20 MG tablet Commonly known as: PRAVACHOL Take 20 mg by mouth daily.       Follow-up Information    [name], MD. Schedule an appointment as soon as possible for a visit in 1 week(s).   Specialty: Family Medicine Contact information: [address] [place] [address] [phone]           Major procedures and Radiology Reports - PLEASE review detailed and final reports thoroughly  -        CT ABDOMEN PELVIS WO CONTRAST  Result Date: 01/13/2020 CLINICAL DATA:  Follow-up of COVID pneumonia. Lung nodule on chest radiograph. Diffuse abdominal pain. EXAM: CT CHEST, ABDOMEN AND PELVIS WITHOUT CONTRAST TECHNIQUE: Multidetector CT imaging of the chest, abdomen and pelvis was performed following the standard protocol without IV contrast. COMPARISON:  Chest radiograph 01/12/2020. CT of the abdomen and pelvis of 12/05/2018. CTA of the chest of 03/04/2017. FINDINGS: CT CHEST FINDINGS Cardiovascular: A left sided central line terminates in the right atrium. Aortic atherosclerosis. Tortuous thoracic aorta. Moderate cardiomegaly, without pericardial effusion. Multivessel coronary artery atherosclerosis. Pulmonary artery enlargement, outflow tract 3.6 cm. 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 Mediastinum/Nodes: Right paratracheal node of 1.1 cm Hilar regions poorly evaluated without intravenous contrast. Lungs/Pleura: No pleural fluid.  Mild centrilobular emphysema. Right greater than left peripheral upper lobe airspace disease with mild surrounding ground-glass. Musculoskeletal: No acute osseous abnormality. CT ABDOMEN PELVIS FINDINGS Hepatobiliary: Hepatomegaly at 19.2 cm craniocaudal. Hepatic morphology, including caudate lobe enlargement and irregular hepatic capsule, highly suspicious for mild cirrhosis. Normal gallbladder, without biliary ductal dilatation. Pancreas: Normal, without mass or ductal dilatation. Spleen: Normal in size, without focal abnormality. Adrenals/Urinary Tract: Mild bilateral adrenal thickening. Native renal atrophy on the left. Right nephrectomy. Multiple low-density left renal lesions are likely cysts. No bladder calculi. Stomach/Bowel: Normal stomach, without wall  thickening. Scattered colonic diverticula. Wall thickening within the sigmoid with mild surrounding edema, including on 95/3. No pericolonic fluid collection or free perforation. Separate area of pericolonic edema posterior to the ascending colon including on 84/3. Vascular/Lymphatic: Aortic and branch vessel atherosclerosis. Reproductive: No abdominopelvic adenopathy.  Normal prostate. Other: Fat containing left inguinal hernia. No significant free fluid. Tiny periumbilical fat containing ventral abdominal wall laxity. Musculoskeletal: Renal osteodystrophy.  Lumbosacral spondylosis. IMPRESSION: CT CHEST IMPRESSION 1. Right greater than left posterior upper lobe airspace and ground-glass opacity. Most consistent with pneumonia. This is not atypical appearance of COVID-19 pneumonia, which cannot be excluded. Other infectious etiologies should also be considered. 2. Aortic atherosclerosis (ICD10-I70.0), coronary artery atherosclerosis and emphysema (ICD10-J43.9). 3. Mild thoracic adenopathy, likely reactive. 4. Pulmonary artery enlargement suggests pulmonary arterial hypertension. CT ABDOMEN AND PELVIS IMPRESSION 1. Sigmoid wall thickening and pericolonic edema, likely related to non complicated diverticulitis. Separate edema posterior to the ascending colon is nonspecific and could represent an area of colitis or mild diverticulitis. 2. Hepatomegaly. Hepatic morphology, highly suspicious for cirrhosis. Electronically Signed   By: Abigail Miyamoto M.D.   On: 01/13/2020 12:36   CT CHEST WO CONTRAST  Result Date: 01/13/2020 CLINICAL DATA:  Follow-up of COVID pneumonia. Lung nodule on chest radiograph. Diffuse abdominal pain. EXAM: CT CHEST, ABDOMEN AND PELVIS WITHOUT CONTRAST TECHNIQUE: Multidetector CT imaging of the chest, abdomen and pelvis was performed following the standard protocol  without IV contrast. COMPARISON:  Chest radiograph 01/12/2020. CT of the abdomen and pelvis of 12/05/2018. CTA of the chest of  03/04/2017. FINDINGS: CT CHEST FINDINGS Cardiovascular: A left sided central line terminates in the right atrium. Aortic atherosclerosis. Tortuous thoracic aorta. Moderate cardiomegaly, without pericardial effusion. Multivessel coronary artery atherosclerosis. Pulmonary artery enlargement, outflow tract 3.6 cm. 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 Mediastinum/Nodes: Right paratracheal node of 1.1 cm Hilar regions poorly evaluated without intravenous contrast. Lungs/Pleura: No pleural fluid.  Mild centrilobular emphysema. Right greater than left peripheral upper lobe airspace disease with mild surrounding ground-glass. Musculoskeletal: No acute osseous abnormality. CT ABDOMEN PELVIS FINDINGS Hepatobiliary: Hepatomegaly at 19.2 cm craniocaudal. Hepatic morphology, including caudate lobe enlargement and irregular hepatic capsule, highly suspicious for mild cirrhosis. Normal gallbladder, without biliary ductal dilatation. Pancreas: Normal, without mass or ductal dilatation. Spleen: Normal in size, without focal abnormality. Adrenals/Urinary Tract: Mild bilateral adrenal thickening. Native renal atrophy on the left. Right nephrectomy. Multiple low-density left renal lesions are likely cysts. No bladder calculi. Stomach/Bowel: Normal stomach, without wall thickening. Scattered colonic diverticula. Wall thickening within the sigmoid with mild surrounding edema, including on 95/3. No pericolonic fluid collection or free perforation. Separate area of pericolonic edema posterior to the ascending colon including on 84/3. Vascular/Lymphatic: Aortic and branch vessel atherosclerosis. Reproductive: No abdominopelvic adenopathy.  Normal prostate. Other: Fat containing left inguinal hernia. No significant free fluid. Tiny periumbilical fat containing ventral abdominal wall laxity. Musculoskeletal: Renal osteodystrophy.  Lumbosacral spondylosis. IMPRESSION: CT CHEST IMPRESSION 1. Right greater  than left posterior upper lobe airspace and ground-glass opacity. Most consistent with pneumonia. This is not atypical appearance of COVID-19 pneumonia, which cannot be excluded. Other infectious etiologies should also be considered. 2. Aortic atherosclerosis (ICD10-I70.0), coronary artery atherosclerosis and emphysema (ICD10-J43.9). 3. Mild thoracic adenopathy, likely reactive. 4. Pulmonary artery enlargement suggests pulmonary arterial hypertension. CT ABDOMEN AND PELVIS IMPRESSION 1. Sigmoid wall thickening and pericolonic edema, likely related to non complicated diverticulitis. Separate edema posterior to the ascending colon is nonspecific and could represent an area of colitis or mild diverticulitis. 2. Hepatomegaly. Hepatic morphology, highly suspicious for cirrhosis. Electronically Signed   By: Abigail Miyamoto M.D.   On: 01/13/2020 12:36   IR Removal Tun Cv Cath W/O FL  Result Date: 01/16/2020 INDICATION: Streptococcus pneumoniae bacteremia. Request for removal of tunneled hemodialysis catheter. Initially placed at an outside facility. EXAM: REMOVAL OF TUNNELED HEMODIALYSIS CATHETER MEDICATIONS: 1% lidocaine with epinephrine 4 mL COMPLICATIONS: None immediate. PROCEDURE: Informed written consent was obtained from the patient following an explanation of the procedure, risks, benefits and alternatives to treatment. A time out was performed prior to the initiation of the procedure. Maximal barrier sterile technique was utilized including caps, mask, sterile gowns, sterile gloves, large sterile drape, hand hygiene, and Betadine. 1% lidocaine with epinephrine was injected under sterile conditions along the subcutaneous tunnel. Utilizing a combination of blunt dissection and gentle traction, the catheter was removed intact. Hemostasis was obtained with manual compression. A dressing was placed. The patient tolerated the procedure well without immediate post procedural complication. IMPRESSION: Successful removal of  tunneled dialysis catheter. Read by: Gareth Eagle, PA-C Electronically Signed   By: Jacqulynn Cadet M.D.   On: 01/16/2020 14:54   DG Chest Port 1 View  Result Date: 01/14/2020 CLINICAL DATA:  Shortness of breath, COVID positive EXAM: PORTABLE CHEST 1 VIEW COMPARISON:  01/12/2020 FINDINGS: Left dialysis catheter remains in place, unchanged. Patchy airspace disease in the right upper lobe again noted. Overall, airspace  disease throughout the right lung has improved. No confluent opacity on the left. No effusions. No acute bony abnormality. Heart is borderline in size. IMPRESSION: Improving airspace disease with mild residual patchy right upper lobe opacities. Electronically Signed   By: Rolm Baptise M.D.   On: 01/14/2020 11:33   DG Chest Port 1 View  Result Date: 01/12/2020 CLINICAL DATA:  Short of breath EXAM: PORTABLE CHEST 1 VIEW COMPARISON:  01/11/2020 FINDINGS: Right upper lobe airspace disease unchanged. Improved aeration in the lung bases with improved lung volume. Negative for edema or effusion. Central venous catheter tip in the right atrium unchanged. IMPRESSION: Persistent right upper lobe airspace disease unchanged Improved aeration lungs with decrease in bibasilar atelectasis. Electronically Signed   By: Franchot Gallo M.D.   On: 01/12/2020 09:16   DG Chest Port 1 View  Result Date: 01/11/2020 CLINICAL DATA:  Shortness of breath EXAM: PORTABLE CHEST 1 VIEW COMPARISON:  01/10/2019 FINDINGS: There is bilateral diffuse interstitial thickening. There is a nodular appearance along the right suprahilar region likely reflecting prominent pulmonary vasculature or airspace disease. There is no pleural effusion or pneumothorax. The heart mediastinum are stable. There is a large bore left-sided central venous catheter with the tip projecting over the cavoatrial junction. There is no acute osseous abnormality. IMPRESSION: Diffuse bilateral interstitial thickening likely reflecting interstitial edema.  Nodular appearance along the right suprahilar region likely reflecting prominent pulmonary vasculature or airspace disease. Electronically Signed   By: Kathreen Devoid   On: 01/11/2020 16:31   DG Chest Port 1 View  Result Date: 01/11/2020 CLINICAL DATA:  Cough, short of breath EXAM: PORTABLE CHEST 1 VIEW COMPARISON:  03/07/2017 FINDINGS: Single frontal view of the chest demonstrates a left internal jugular dialysis catheter tip overlying atrial caval junction. Vascular stent overlies the right axilla. Cardiac silhouette is stable. There is a 7.3 cm mass projecting over the right anterior first and second ribs. CT chest with IV contrast recommended for suspected neoplasm. There is central vascular congestion with diffuse interstitial prominence consistent with mild fluid overload. Right hilar prominence could also reflect lymphadenopathy. No effusion or pneumothorax. No acute bony abnormality. IMPRESSION: 1. 7.3 cm right upper lobe mass consistent with neoplasm. CT chest with IV contrast is recommended. 2. Right hilar prominence could reflect underlying adenopathy. 3. Vascular congestion and interstitial edema. Electronically Signed   By: Randa Ngo M.D.   On: 01/11/2020 04:02   VAS [place] UPPER EXTREMITY ARTERIAL DUPLEX  Result Date: 12/25/2019 UPPER EXTREMITY DUPLEX STUDY Indications: Pre-operative exam.  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 Bilateral testing is considered an integral part of a complete examination. Limited examinations for reoccurring indications may be performed as noted.  Right Pre-Dialysis Findings: +-----------------------+----------+--------------------+---------+--------+ Location                PSV (cm/s)Intralum. Diam. (cm)Waveform Comments +-----------------------+----------+--------------------+---------+--------+ Brachial Antecub. fossa40        0.46                triphasic         +-----------------------+----------+--------------------+---------+--------+ Radial Art at Wrist    35        0.29  triphasic         +-----------------------+----------+--------------------+---------+--------+ Ulnar Art at Wrist     81        0.27                triphasic         +-----------------------+----------+--------------------+---------+--------+ +------------+-----------------+ Allen's TestBranches at fossa +------------+-----------------+ Left Pre-Dialysis Findings: +-----------------------+----------+--------------------+---------+--------+ Location               PSV (cm/s)Intralum. Diam. (cm)Waveform Comments +-----------------------+----------+--------------------+---------+--------+ Brachial Antecub. fossa54        0.52                triphasic         +-----------------------+----------+--------------------+---------+--------+ Radial Art at Wrist    44        0.27                triphasictortuous +-----------------------+----------+--------------------+---------+--------+ Ulnar Art at Wrist     73        0.19                triphasic         +-----------------------+----------+--------------------+---------+--------+ +------------+-----------------+ Allen's TestBranches at fossa +------------+-----------------+  Summary:   Measurements above. *See table(s) above for measurements and observations. Electronically signed by Curt Jews MD on 12/25/2019 at 9:30:15 AM.    Final    VAS [place] UPPER EXTREMITY VEIN MAPPING  Result Date: 12/25/2019 UPPER EXTREMITY VEIN MAPPING  Indications: Pre-access. History: History of right Radiocephalic fistula years ago.  Performing Technologist: Alvia Grove RVT  Examination Guidelines: A complete evaluation  includes B-mode imaging, spectral Doppler, color Doppler, and power Doppler as needed of all accessible portions of each vessel. Bilateral testing is considered an integral part of a complete examination. Limited examinations for reoccurring indications may be performed as noted. +-----------------+-------------+----------+--------+ Right Cephalic   Diameter (cm)Depth (cm)Findings +-----------------+-------------+----------+--------+ Prox upper arm                             [place]    +-----------------+-------------+----------+--------+ Mid upper arm                              [place]    +-----------------+-------------+----------+--------+ Dist upper arm                             [place]    +-----------------+-------------+----------+--------+ Antecubital fossa                          [place]    +-----------------+-------------+----------+--------+ +-----------------+-------------+----------+---------+ Right Basilic    Diameter (cm)Depth (cm)Findings  +-----------------+-------------+----------+---------+ Prox upper arm       0.46                         +-----------------+-------------+----------+---------+ Mid upper arm        0.68               branching +-----------------+-------------+----------+---------+ Dist upper arm       0.34                         +-----------------+-------------+----------+---------+ Antecubital fossa    0.31                         +-----------------+-------------+----------+---------+ Prox forearm  0.27                         +-----------------+-------------+----------+---------+ +-----------------+-------------+----------+--------+ Left Cephalic    Diameter (cm)Depth (cm)Findings +-----------------+-------------+----------+--------+ Shoulder             0.47                        +-----------------+-------------+----------+--------+ Prox upper arm       0.36                         +-----------------+-------------+----------+--------+ Mid upper arm        0.41                        +-----------------+-------------+----------+--------+ Dist upper arm       0.43                        +-----------------+-------------+----------+--------+ Antecubital fossa    0.54                        +-----------------+-------------+----------+--------+ Prox forearm         0.43                        +-----------------+-------------+----------+--------+ Mid forearm          0.38                        +-----------------+-------------+----------+--------+ Dist forearm         0.33                        +-----------------+-------------+----------+--------+ +-----------------+-------------+----------+--------+ Left Basilic     Diameter (cm)Depth (cm)Findings +-----------------+-------------+----------+--------+ Prox upper arm       0.48                        +-----------------+-------------+----------+--------+ Mid upper arm        0.51                        +-----------------+-------------+----------+--------+ Dist upper arm       0.47                        +-----------------+-------------+----------+--------+ Antecubital fossa    0.26                        +-----------------+-------------+----------+--------+ Prox forearm         0.25                        +-----------------+-------------+----------+--------+ Summary: Right: Occluded right forearm fistula. Thrombus in the cephalic vein        from the antecubital fossa extending proximally into the        subclavian vein, appearing chronic. Left: Measurements above. *See table(s) above for measurements and observations.  Diagnosing physician: Curt Jews MD Electronically signed by Curt Jews MD on 12/25/2019 at 9:27:41 AM.    Final     Micro Results     Recent Results (from the past 240 hour(s))  Respiratory Panel by RT PCR (Flu A&B, Covid) - Nasopharyngeal Swab     Status: Abnormal    Collection  Time: 01/11/20  4:15 PM   Specimen: Nasopharyngeal Swab  Result Value Ref Range Status   SARS Coronavirus 2 by RT PCR POSITIVE (A) NEGATIVE Final    Comment: RESULT CALLED TO, READ BACK BY AND VERIFIED WITH: Kathryne Sharper RN [id number] 01/11/20 JM (NOTE) SARS-CoV-2 target nucleic acids are DETECTED. SARS-CoV-2 RNA is generally detectable in upper respiratory specimens  during the acute phase of infection. Positive results are indicative of the presence of the identified virus, but do not rule out bacterial infection or co-infection with other pathogens not detected by the test. Clinical correlation with patient history and other diagnostic information is necessary to determine patient infection status. The expected result is Negative. Fact Sheet for Patients:  PinkCheek.be Fact Sheet for Healthcare Providers: GravelBags.it This test is not yet approved or cleared by the [place] FDA and  has been authorized for detection and/or diagnosis of SARS-CoV-2 by FDA under an Emergency Use Authorization (EUA).  This EUA will remain in effect (meaning this test can be used) for th e duration of  the COVID-19 declaration under Section 564(b)(1) of the Act, 21 U.S.C. section 360bbb-3(b)(1), unless the authorization is terminated or revoked sooner.    Influenza A by PCR NEGATIVE NEGATIVE Final   Influenza B by PCR NEGATIVE NEGATIVE Final    Comment: (NOTE) The Xpert Xpress SARS-CoV-2/FLU/RSV assay is intended as an aid in  the diagnosis of influenza from Nasopharyngeal swab specimens and  should not be used as a sole basis for treatment. Nasal washings and  aspirates are unacceptable for Xpert Xpress SARS-CoV-2/FLU/RSV  testing. Fact Sheet for Patients: PinkCheek.be Fact Sheet for Healthcare Providers: GravelBags.it This test is not yet approved or cleared by the [place] FDA and  has been authorized for detection and/or diagnosis of SARS-CoV-2 by  FDA under an Emergency Use Authorization (EUA). This EUA will remain  in effect (meaning this test can be used) for the duration of the  Covid-19 declaration under Section 564(b)(1) of the Act, 21  U.S.C. section 360bbb-3(b)(1), unless the authorization is  terminated or revoked. Performed at [facility], [place] [address], [place], [place] [address]   Culture, blood (routine x 2)     Status: Abnormal   Collection Time: 01/11/20  4:52 PM   Specimen: BLOOD  Result Value Ref Range Status   Specimen Description   Final    BLOOD RIGHT ANTECUBITAL Performed at [facility] [address], [place], [place] [address]    Special Requests   Final    BOTTLES DRAWN AEROBIC AND ANAEROBIC Blood Culture adequate volume Performed at [facility] [address] [place], [place] [address]    Culture  Setup Time   Final    GRAM POSITIVE COCCI IN CHAINS AEROBIC BOTTLE ONLY CRITICAL RESULT CALLED TO, READ BACK BY AND VERIFIED WITH: PHARMD L CHEN [id number] AT 1011 BY CM Performed at [facility], [place] [address] [place], [place] [address]    Culture STREPTOCOCCUS PNEUMONIAE (A)  Final   Report Status 01/14/2020 FINAL  Final   Organism ID, Bacteria STREPTOCOCCUS PNEUMONIAE  Final      Susceptibility   Streptococcus pneumoniae - MIC*    ERYTHROMYCIN <=0.12 SENSITIVE Sensitive     LEVOFLOXACIN 0.5 SENSITIVE Sensitive     VANCOMYCIN 0.5 SENSITIVE Sensitive     PENICILLIN (meningitis) 0.12 RESISTANT Resistant     PENO - penicillin 0.12      PENICILLIN (non-meningitis) 0.12 SENSITIVE Sensitive  PENICILLIN (oral) 0.12 INTERMEDIATE Intermediate     CEFTRIAXONE (non-meningitis) <=0.12 SENSITIVE Sensitive     CEFTRIAXONE (meningitis) <=0.12 SENSITIVE Sensitive     * STREPTOCOCCUS PNEUMONIAE  Culture, blood (routine x 2)     Status: None   Collection Time:  01/11/20  5:46 PM   Specimen: BLOOD LEFT HAND  Result Value Ref Range Status   Specimen Description   Final    BLOOD LEFT HAND Performed at [facility] [address] [place], [place] [address]    Special Requests   Final    BOTTLES DRAWN AEROBIC ONLY Blood Culture adequate volume Performed at [facility] [address], [place], [place] [address]    Culture   Final    NO GROWTH 5 DAYS Performed at [facility], [place] [address], [place], [place] [address]    Report Status 01/16/2020 FINAL  Final  MRSA PCR Screening     Status: None   Collection Time: 01/12/20  2:28 AM   Specimen: Nasal Mucosa; Nasopharyngeal  Result Value Ref Range Status   MRSA by PCR NEGATIVE NEGATIVE Final    Comment:        The GeneXpert MRSA Assay (FDA approved for NASAL specimens only), is one component of a comprehensive MRSA colonization surveillance program. It is not intended to diagnose MRSA infection nor to guide or monitor treatment for MRSA infections. Performed at [facility], [place] [address], [place], [place] [address]   Culture, blood (routine x 2)     Status: None   Collection Time: 01/14/20 12:10 PM   Specimen: BLOOD  Result Value Ref Range Status   Specimen Description BLOOD LEFT ANTECUBITAL  Final   Special Requests   Final    BOTTLES DRAWN AEROBIC ONLY Blood Culture results may not be optimal due to an inadequate volume of blood received in culture bottles   Culture   Final    NO GROWTH 5 DAYS Performed at [facility], [place] [address] [place], [place] [address]    Report Status 01/19/2020 FINAL  Final  Culture, blood (routine x 2)     Status: None   Collection Time: 01/14/20 12:13 PM   Specimen: BLOOD LEFT HAND  Result Value Ref Range Status   Specimen Description BLOOD LEFT HAND  Final   Special Requests   Final    BOTTLES DRAWN AEROBIC ONLY Blood Culture results may not be optimal due to an inadequate volume of  blood received in culture bottles   Culture   Final    NO GROWTH 5 DAYS Performed at [facility], [place] [address], [place], [place] [address]    Report Status 01/19/2020 FINAL  Final    Today   Subjective    [name] today has no headache,no chest abdominal pain,no new weakness tingling or numbness, feels much better wants to go home today.    Objective   Blood pressure 92/60, pulse 90, temperature 98.6 F (37 C), temperature source Oral, resp. rate 20, height '6\' 1"'$  (1.854 m), weight 125 kg, SpO2 100 %.   Intake/Output Summary (Last 24 hours) at 01/21/2020 0931 Last data filed at 01/21/2020 0130 Gross per 24 hour  Intake 230 ml  Output 3859 ml  Net -3629 ml    Exam Awake Alert, Oriented x 3, No new F.N deficits, Normal affect .AT,PERRAL Supple Neck,No JVD, No cervical lymphadenopathy appriciated.  Symmetrical Chest wall movement, Good air movement bilaterally, CTAB  RRR,No Gallops,Rubs or new Murmurs, No Parasternal Heave +ve B.Sounds, Abd Soft, Non tender, No organomegaly appriciated, No rebound -guarding or rigidity. No Cyanosis, new left-sided tunneled HD catheter in place and site appears clean   Data Review   CBC w Diff:  Lab Results  Component Value Date   WBC 12.1 (H) 01/20/2020   HGB 10.5 (L) 01/20/2020   HGB 13.5 03/02/2012   HCT 35.6 (L) 01/20/2020   HCT 43.2 03/02/2012   PLT 203 01/20/2020   PLT 206 03/02/2012   LYMPHOPCT 18 01/20/2020   LYMPHOPCT 27.9 03/02/2012   BANDSPCT 6 03/10/2017   MONOPCT 7 01/20/2020   MONOPCT 9.2 03/02/2012   EOSPCT 4 01/20/2020   EOSPCT 1.4 03/02/2012   BASOPCT 1 01/20/2020   BASOPCT 0.3 03/02/2012    CMP:  Lab Results  Component Value Date   NA 138 01/20/2020   K 4.1 01/20/2020   CL 102 01/20/2020   CO2 17 (L) 01/20/2020   BUN 95 (H) 01/20/2020   CREATININE 16.37 (H) 01/20/2020   CREATININE 16.29 (H) 11/17/2016   PROT 6.7 01/20/2020   ALBUMIN 2.8 (L) 01/20/2020   BILITOT 0.6 01/20/2020   ALKPHOS  90 01/20/2020   AST 10 (L) 01/20/2020   ALT 12 01/20/2020   ALT 60 (H) 11/17/2016  .   Total Time in preparing paper work, data evaluation and todays exam - 52 minutes  Lala Lund M.D on 01/21/2020 at 9:31 AM  [facility]   Office  [phone]

## 2020-01-21 NOTE — Progress Notes (Signed)
Occupational Therapy Treatment Patient Details Name: [name] MRN: [id number] DOB: Nov 17, 1955 Today's Date: 01/21/2020    History of present illness Pt is a 64 y.o. male admitted 01/11/20 with c/o "not feeling well." Worked revealed COVID-19 infection with PNA. CXR with RUL mass suspicious for postobstructive bacterial PNA. PMH includes HTN, OSA, DM2, ESRD (HF MWF), Hep-C, pt reports R charcot foot deformity (wears boot).   OT comments  PTA, Pt lived with family and was Independent in ADLs, IADLs, and mobility. Pt with planned DC home today. Pt Independent for bed mobility and sit to stand transfers. Pt Supervision for mobility to/from bathroom with RW and toilet transfer to ensure no dizziness or unsteadiness. Pt Modified Independent for toileting task with RW and grab bars. Pt Modified Independent for grooming tasks, UB bathing, and UB dressing while seated at sink. Pt Setup/Superivison for LB bathing. Pt Min A for LB dressing to don sock, Charcot boot, and pants around feet. Pt reports his wife assists him with this task at home. Pt VSS on RA.    Follow Up Recommendations  Home health OT;Supervision - Intermittent    Equipment Recommendations  Tub/shower seat    Recommendations for Other Services      Precautions / Restrictions Precautions Precautions: Fall;Other (comment) Precaution Comments: R charcot foot deformity w/ boot pt wears at baseline (currently top strap is broken but able to be used by wrapping it around and securing) Restrictions Weight Bearing Restrictions: No       Mobility Bed Mobility Overal bed mobility: Independent                Transfers Overall transfer level: Needs assistance Equipment used: Rolling walker (2 wheeled) Transfers: Sit to/from [employer] Transfers Sit to Stand: Modified independent (Device/Increase time) Stand pivot transfers: Supervision            Balance                                            ADL either performed or assessed with clinical judgement   ADL       Grooming: Modified independent;Sitting Grooming Details (indicate cue type and reason): Pt Modified Independent for grooming tasks sitting at sink for energy conservation strategies Upper Body Bathing: Modified independent;Sitting Upper Body Bathing Details (indicate cue type and reason): Pt Modified Independent for UB bathing while seated at sink  Lower Body Bathing: Modified independent;Sit to/from stand Lower Body Bathing Details (indicate cue type and reason): Pt Modified Independent for LB bathing seated and standing at sink, no LOB. Appropriate pacing noted  Upper Body Dressing : Independent   Lower Body Dressing: Minimal assistance;Sit to/from stand Lower Body Dressing Details (indicate cue type and reason): Pt Min A for donning socks and Charcot boot. Pt reports his wife assists him with this at home` Toilet Transfer: Supervision/safety;Ambulation;Regular Glass blower/designer Details (indicate cue type and reason): Superivison to monitor for dizzinesss, unsteadiness Toileting- Clothing Manipulation and Hygiene: Modified independent;Sit to/from stand Toileting - Clothing Manipulation Details (indicate cue type and reason): Modified Independent with use of RW and grab bars     Functional mobility during ADLs: Supervision/safety;Rolling walker General ADL Comments: Pt supervision for mobility to/from bathroom using RW. No overt balance issues noted     Vision       Perception     Praxis      Cognition Arousal/Alertness:  Awake/alert Behavior During Therapy: WFL for tasks assessed/performed Overall Cognitive Status: Within Functional Limits for tasks assessed                                          Exercises     Shoulder Instructions       General Comments VSS    Pertinent Vitals/ Pain       Pain Assessment: Faces Faces Pain Scale: Hurts a little bit Pain Location:  Neck Pain Descriptors / Indicators: Discomfort Pain Intervention(s): Repositioned;Other (comment)(encouraged stretching )  Home Living                                          Prior Functioning/Environment              Frequency  Min 2X/week        Progress Toward Goals  OT Goals(current goals can now be found in the care plan section)  Progress towards OT goals: Progressing toward goals  Acute Rehab OT Goals Patient Stated Goal: to go home OT Goal Formulation: With patient Time For Goal Achievement: 01/27/20 Potential to Achieve Goals: Good ADL Goals Pt Will Perform Grooming: with modified independence;standing Pt Will Perform Lower Body Bathing: with modified independence;sit to/from stand Pt Will Perform Lower Body Dressing: with modified independence;sit to/from stand Pt Will Transfer to Toilet: with modified independence;ambulating Pt Will Perform Toileting - Clothing Manipulation and hygiene: with modified independence;sit to/from stand  Plan Discharge plan remains appropriate    Co-evaluation                 AM-PAC OT "6 Clicks" Daily Activity     Outcome Measure   Help from another person eating meals?: None Help from another person taking care of personal grooming?: None Help from another person toileting, which includes using toliet, bedpan, or urinal?: None Help from another person bathing (including washing, rinsing, drying)?: A Little Help from another person to put on and taking off regular upper body clothing?: None Help from another person to put on and taking off regular lower body clothing?: A Little 6 Click Score: 22    End of Session Equipment Utilized During Treatment: Rolling walker  OT Visit Diagnosis: Other abnormalities of gait and mobility (R26.89);Unsteadiness on feet (R26.81)   Activity Tolerance Patient tolerated treatment well   Patient Left in chair;with call bell/phone within reach   Nurse  Communication          Time: 2831-5176 OT Time Calculation (min): 36 min  Charges: OT General Charges $OT Visit: 1 Visit OT Treatments $Self Care/Home Management : 23-37 mins  Layla Maw, OTR/L   Layla Maw 01/21/2020, 10:04 AM

## 2020-01-21 NOTE — Progress Notes (Signed)
Patient was discharged home by MD order; discharged instructions review and given to patient with care notes ; IV DIC; skin intact; patient will be escorted to the car by nurse tech via wheelchair.

## 2020-01-21 NOTE — TOC Transition Note (Signed)
Transition of Care [facility]) - CM/SW Discharge Note   Patient Details  Name: [name] MRN: [id number] Date of Birth: 1956/09/19  [facility] of [facility] CM/SW Contact:  Maryclare Labrador, RN Phone Number: 01/21/2020, 8:17 AM   Clinical Narrative:   _Pt deemed appropriate for discharge home today.  Pt is ESRD on outpt HD - renal coordinator aware of admit and made aware of discharge this am.  Pt declining HH and DME as recommended.  Pt denied all NC360 parameters.  Pt requested CM to arrange follow up televist appt wit PCP - see AVS.  Discharge orders signed - no other CM needs determined.  CM signing off.      Final next level of care: Home/Self Care Barriers to Discharge: Barriers Resolved   Patient Goals and CMS Choice        Discharge Placement                       Discharge Plan and Services                          HH Arranged: Refused HH          Social Determinants of Health (SDOH) Interventions     Readmission Risk Interventions No flowsheet data found.

## 2020-01-21 NOTE — Progress Notes (Signed)
Physical Therapy Treatment Note  Patient is scheduled to discharge home today. He denies having any concerns about his mobility for discharge home. He is already sitting up in the chair, dressed, with L boot for Charcot foot deformity already on. Patient unsteady in short distance ambulation without AD. Education on PT recommendation for use of RW for ambulation upon discharge home.  Continued recommendation for home PT and use of RW.    01/21/20 1008  PT Visit Information  Last PT Received On 01/21/20  Assistance Needed +1  History of Present Illness Pt is a 64 year old male admitted 01/11/20 with c/o "not feeling well." Worked revealed COVID-19 infection with PNA. CXR with RUL mass suspicious for postobstructive bacterial PNA. PMH includes HTN, OSA, DM2, ESRD (HF MWF), Hep-C, pt reports R charcot foot deformity (wears boot).  Subjective Data  Subjective Patient confirms he is discharging today.  Precautions  Precautions Fall;Other (comment)  Precaution Comments R charcot foot deformity w/ boot pt wears at baseline (currently top strap is broken but able to be used by wrapping it around and securing)  Restrictions  Weight Bearing Restrictions No  Pain Assessment  Pain Assessment No/denies pain (No complaints of pain, no signs/symptoms of pain)  Bed Mobility  General bed mobility comments Patient sitting in chair upon PT arrival.  Transfers  Overall transfer level Needs assistance  Equipment used None;Rolling walker (2 wheeled)  Transfers Sit to/from Stand  Sit to Stand Supervision;Modified independent (Device/Increase time)  General transfer comment cue for hand placement for sit>stand with RW, trial 1: sit<>stand without AD and supervision, trial 2: sit>stand with RW modI  Ambulation/Gait  Ambulation/Gait assistance Min guard;Supervision  Gait Distance (Feet) 10 Feet (30)  Assistive device None;Rolling walker (2 wheeled)  Gait Pattern/deviations Step-through pattern;Step-to  pattern;Decreased stance time - right  General Gait Details Trial 1: 74ft without AD (as pt did not use AD PTA) and patient unsteady requiring reaching for environmental objects to steady self. Trial 2 with RW 12ft in room (patient declined ambulation outside of room) with improved gait quality, cues for obstacle negotiation. Education provided on PT recommendation for use of RW upon discharge home.  Gait velocity Decreased  Stairs Yes  Stairs assistance Min assist;Min guard  Stair Management  (L then R rail)  Number of Stairs 2  General stair comments Stair negotiation using foot of bed as railing, demo with return demo of technique, and contact guard on ascent, minA on descent  Balance  Overall balance assessment Needs assistance  Standing balance support Bilateral upper extremity supported;During functional activity  Standing balance comment modI static standing balance with RW, supervision dynamic standing balance with RW  Exercises  Exercises Other exercises  Other Exercises  Other Exercises Patient using flutter valve upon PT exit from room.   PT - Assessment/Plan  PT Plan Current plan remains appropriate  PT Visit Diagnosis Unsteadiness on feet (R26.81);Other abnormalities of gait and mobility (R26.89)  PT Frequency (ACUTE ONLY) Min 3X/week  Follow Up Recommendations Supervision for mobility/OOB;Supervision - Intermittent;Home health PT  PT equipment Rolling walker with 5" wheels  AM-PAC PT "6 Clicks" Mobility Outcome Measure (Version 2)  Help needed turning from your back to your side while in a flat bed without using bedrails? 4  Help needed moving from lying on your back to sitting on the side of a flat bed without using bedrails? 3  Help needed moving to and from a bed to a chair (including a wheelchair)? 3  Help needed  standing up from a chair using your arms (e.g., wheelchair or bedside chair)? 4  Help needed to walk in hospital room? 3  Help needed climbing 3-5 steps with a  railing?  3  6 Click Score 20  Consider Recommendation of Discharge To: Home with no services  PT Goal Progression  Progress towards PT goals Progressing toward goals  PT Time Calculation  PT Start Time (ACUTE ONLY) 1008  PT Stop Time (ACUTE ONLY) 1019  PT Time Calculation (min) (ACUTE ONLY) 11 min  PT General Charges  $$ ACUTE PT VISIT 1 Visit  PT Treatments  $Gait Training 8-22 mins   [name], DPT, PT Acute Rehab [phone] office

## 2020-01-22 ENCOUNTER — Other Ambulatory Visit: Payer: Self-pay

## 2020-01-22 ENCOUNTER — Encounter (HOSPITAL_COMMUNITY): Payer: Self-pay | Admitting: Internal Medicine

## 2020-01-22 ENCOUNTER — Inpatient Hospital Stay (HOSPITAL_COMMUNITY)
Admission: EM | Admit: 2020-01-22 | Discharge: 2020-01-25 | DRG: 871 | Disposition: A | Discharge status: Home Health | Payer: Medicare Other | Attending: Internal Medicine | Admitting: Internal Medicine

## 2020-01-22 ENCOUNTER — Emergency Department (HOSPITAL_COMMUNITY): Payer: Medicare Other

## 2020-01-22 DIAGNOSIS — I959 Hypotension, unspecified: Secondary | ICD-10-CM | POA: Diagnosis present

## 2020-01-22 DIAGNOSIS — E0829 Diabetes mellitus due to underlying condition with other diabetic kidney complication: Secondary | ICD-10-CM | POA: Diagnosis not present

## 2020-01-22 DIAGNOSIS — G4730 Sleep apnea, unspecified: Secondary | ICD-10-CM | POA: Diagnosis present

## 2020-01-22 DIAGNOSIS — M109 Gout, unspecified: Secondary | ICD-10-CM | POA: Diagnosis present

## 2020-01-22 DIAGNOSIS — A4150 Gram-negative sepsis, unspecified: Principal | ICD-10-CM | POA: Diagnosis present

## 2020-01-22 DIAGNOSIS — N2581 Secondary hyperparathyroidism of renal origin: Secondary | ICD-10-CM | POA: Diagnosis not present

## 2020-01-22 DIAGNOSIS — N186 End stage renal disease: Secondary | ICD-10-CM | POA: Diagnosis not present

## 2020-01-22 DIAGNOSIS — G4733 Obstructive sleep apnea (adult) (pediatric): Secondary | ICD-10-CM | POA: Diagnosis present

## 2020-01-22 DIAGNOSIS — B955 Unspecified streptococcus as the cause of diseases classified elsewhere: Secondary | ICD-10-CM | POA: Diagnosis present

## 2020-01-22 DIAGNOSIS — I132 Hypertensive heart and chronic kidney disease with heart failure and with stage 5 chronic kidney disease, or end stage renal disease: Secondary | ICD-10-CM | POA: Diagnosis present

## 2020-01-22 DIAGNOSIS — H9190 Unspecified hearing loss, unspecified ear: Secondary | ICD-10-CM | POA: Diagnosis present

## 2020-01-22 DIAGNOSIS — Z905 Acquired absence of kidney: Secondary | ICD-10-CM

## 2020-01-22 DIAGNOSIS — R918 Other nonspecific abnormal finding of lung field: Secondary | ICD-10-CM | POA: Diagnosis not present

## 2020-01-22 DIAGNOSIS — E1129 Type 2 diabetes mellitus with other diabetic kidney complication: Secondary | ICD-10-CM | POA: Diagnosis not present

## 2020-01-22 DIAGNOSIS — E1122 Type 2 diabetes mellitus with diabetic chronic kidney disease: Secondary | ICD-10-CM | POA: Diagnosis present

## 2020-01-22 DIAGNOSIS — Z794 Long term (current) use of insulin: Secondary | ICD-10-CM

## 2020-01-22 DIAGNOSIS — Z720 Tobacco use: Secondary | ICD-10-CM | POA: Diagnosis present

## 2020-01-22 DIAGNOSIS — E669 Obesity, unspecified: Secondary | ICD-10-CM | POA: Diagnosis present

## 2020-01-22 DIAGNOSIS — Z992 Dependence on renal dialysis: Secondary | ICD-10-CM | POA: Diagnosis not present

## 2020-01-22 DIAGNOSIS — Z87891 Personal history of nicotine dependence: Secondary | ICD-10-CM

## 2020-01-22 DIAGNOSIS — Z888 Allergy status to other drugs, medicaments and biological substances status: Secondary | ICD-10-CM

## 2020-01-22 DIAGNOSIS — B182 Chronic viral hepatitis C: Secondary | ICD-10-CM | POA: Diagnosis present

## 2020-01-22 DIAGNOSIS — D509 Iron deficiency anemia, unspecified: Secondary | ICD-10-CM | POA: Diagnosis not present

## 2020-01-22 DIAGNOSIS — Z8249 Family history of ischemic heart disease and other diseases of the circulatory system: Secondary | ICD-10-CM

## 2020-01-22 DIAGNOSIS — I120 Hypertensive chronic kidney disease with stage 5 chronic kidney disease or end stage renal disease: Secondary | ICD-10-CM | POA: Diagnosis not present

## 2020-01-22 DIAGNOSIS — A419 Sepsis, unspecified organism: Secondary | ICD-10-CM | POA: Diagnosis not present

## 2020-01-22 DIAGNOSIS — R001 Bradycardia, unspecified: Secondary | ICD-10-CM | POA: Diagnosis not present

## 2020-01-22 DIAGNOSIS — E1165 Type 2 diabetes mellitus with hyperglycemia: Secondary | ICD-10-CM | POA: Diagnosis present

## 2020-01-22 DIAGNOSIS — E785 Hyperlipidemia, unspecified: Secondary | ICD-10-CM | POA: Diagnosis present

## 2020-01-22 DIAGNOSIS — I10 Essential (primary) hypertension: Secondary | ICD-10-CM | POA: Diagnosis present

## 2020-01-22 DIAGNOSIS — Z833 Family history of diabetes mellitus: Secondary | ICD-10-CM

## 2020-01-22 DIAGNOSIS — E11610 Type 2 diabetes mellitus with diabetic neuropathic arthropathy: Secondary | ICD-10-CM | POA: Diagnosis present

## 2020-01-22 DIAGNOSIS — D631 Anemia in chronic kidney disease: Secondary | ICD-10-CM | POA: Diagnosis not present

## 2020-01-22 DIAGNOSIS — B1920 Unspecified viral hepatitis C without hepatic coma: Secondary | ICD-10-CM | POA: Diagnosis present

## 2020-01-22 DIAGNOSIS — Z79899 Other long term (current) drug therapy: Secondary | ICD-10-CM

## 2020-01-22 DIAGNOSIS — Z85118 Personal history of other malignant neoplasm of bronchus and lung: Secondary | ICD-10-CM

## 2020-01-22 DIAGNOSIS — Z6836 Body mass index (BMI) 36.0-36.9, adult: Secondary | ICD-10-CM

## 2020-01-22 DIAGNOSIS — R69 Illness, unspecified: Secondary | ICD-10-CM

## 2020-01-22 DIAGNOSIS — Z880 Allergy status to penicillin: Secondary | ICD-10-CM | POA: Diagnosis not present

## 2020-01-22 DIAGNOSIS — Z85528 Personal history of other malignant neoplasm of kidney: Secondary | ICD-10-CM

## 2020-01-22 DIAGNOSIS — N250 Renal osteodystrophy: Secondary | ICD-10-CM | POA: Diagnosis not present

## 2020-01-22 DIAGNOSIS — Z974 Presence of external hearing-aid: Secondary | ICD-10-CM

## 2020-01-22 DIAGNOSIS — IMO0002 Reserved for concepts with insufficient information to code with codable children: Secondary | ICD-10-CM | POA: Diagnosis present

## 2020-01-22 DIAGNOSIS — K219 Gastro-esophageal reflux disease without esophagitis: Secondary | ICD-10-CM | POA: Diagnosis present

## 2020-01-22 DIAGNOSIS — U071 COVID-19: Secondary | ICD-10-CM | POA: Diagnosis present

## 2020-01-22 DIAGNOSIS — E119 Type 2 diabetes mellitus without complications: Secondary | ICD-10-CM

## 2020-01-22 LAB — CBC WITH DIFFERENTIAL/PLATELET
Abs Immature Granulocytes: 0.27 10*3/uL — ABNORMAL HIGH (ref 0.00–0.07)
Basophils Absolute: 0 10*3/uL (ref 0.0–0.1)
Basophils Relative: 0 %
Eosinophils Absolute: 0 10*3/uL (ref 0.0–0.5)
Eosinophils Relative: 0 %
HCT: 36.8 % — ABNORMAL LOW (ref 39.0–52.0)
Hemoglobin: 10.7 g/dL — ABNORMAL LOW (ref 13.0–17.0)
Immature Granulocytes: 2 %
Lymphocytes Relative: 11 %
Lymphs Abs: 1.7 10*3/uL (ref 0.7–4.0)
MCH: 23.3 pg — ABNORMAL LOW (ref 26.0–34.0)
MCHC: 29.1 g/dL — ABNORMAL LOW (ref 30.0–36.0)
MCV: 80 fL (ref 80.0–100.0)
Monocytes Absolute: 1.1 10*3/uL — ABNORMAL HIGH (ref 0.1–1.0)
Monocytes Relative: 7 %
Neutro Abs: 12.5 10*3/uL — ABNORMAL HIGH (ref 1.7–7.7)
Neutrophils Relative %: 80 %
Platelets: 183 10*3/uL (ref 150–400)
RBC: 4.6 MIL/uL (ref 4.22–5.81)
RDW: 20.9 % — ABNORMAL HIGH (ref 11.5–15.5)
WBC: 15.6 10*3/uL — ABNORMAL HIGH (ref 4.0–10.5)
nRBC: 0.3 % — ABNORMAL HIGH (ref 0.0–0.2)

## 2020-01-22 LAB — PROTIME-INR
INR: 1.2 (ref 0.8–1.2)
Prothrombin Time: 15.2 seconds (ref 11.4–15.2)

## 2020-01-22 LAB — COMPREHENSIVE METABOLIC PANEL
ALT: 13 U/L (ref 0–44)
AST: 12 U/L — ABNORMAL LOW (ref 15–41)
Albumin: 3.1 g/dL — ABNORMAL LOW (ref 3.5–5.0)
Alkaline Phosphatase: 97 U/L (ref 38–126)
Anion gap: 21 — ABNORMAL HIGH (ref 5–15)
BUN: 92 mg/dL — ABNORMAL HIGH (ref 8–23)
CO2: 13 mmol/L — ABNORMAL LOW (ref 22–32)
Calcium: 8.4 mg/dL — ABNORMAL LOW (ref 8.9–10.3)
Chloride: 106 mmol/L (ref 98–111)
Creatinine, Ser: 18.13 mg/dL — ABNORMAL HIGH (ref 0.61–1.24)
GFR calc Af Amer: 3 mL/min — ABNORMAL LOW (ref >60)
GFR calc non Af Amer: 2 mL/min — ABNORMAL LOW (ref >60)
Glucose, Bld: 164 mg/dL — ABNORMAL HIGH (ref 70–99)
Potassium: 4.5 mmol/L (ref 3.5–5.1)
Sodium: 140 mmol/L (ref 135–145)
Total Bilirubin: 0.7 mg/dL (ref 0.3–1.2)
Total Protein: 7.1 g/dL (ref 6.5–8.1)

## 2020-01-22 LAB — GLUCOSE, CAPILLARY: Glucose-Capillary: 141 mg/dL — ABNORMAL HIGH (ref 70–99)

## 2020-01-22 LAB — C DIFFICILE QUICK SCREEN W PCR REFLEX
C Diff antigen: NEGATIVE
C Diff interpretation: NOT DETECTED
C Diff toxin: NEGATIVE

## 2020-01-22 LAB — LACTIC ACID, PLASMA: Lactic Acid, Venous: 1.8 mmol/L (ref 0.5–1.9)

## 2020-01-22 LAB — APTT: aPTT: >200 seconds (ref 24–36)

## 2020-01-22 MED ORDER — ACETAMINOPHEN 650 MG RE SUPP: 650.0000 mg | Freq: Four times a day (QID) | RECTAL | Status: DC | PRN

## 2020-01-22 MED ORDER — ACETAMINOPHEN 325 MG PO TABS: 650.0000 mg | ORAL_TABLET | Freq: Four times a day (QID) | ORAL | Status: DC | PRN

## 2020-01-22 MED ORDER — SODIUM CHLORIDE 0.9 % IV SOLN
2.0000 g | Freq: Once | INTRAVENOUS | Status: AC
  Administered 2020-01-22: 2 g via INTRAVENOUS
  Filled 2020-01-22: qty 20

## 2020-01-22 MED ORDER — PRAVASTATIN SODIUM 10 MG PO TABS
20.0000 mg | ORAL_TABLET | Freq: Every day | ORAL | Status: DC
  Administered 2020-01-22 – 2020-01-25 (×4): 20 mg via ORAL
  Filled 2020-01-22 (×4): qty 2

## 2020-01-22 MED ORDER — MIRTAZAPINE 15 MG PO TABS
15.0000 mg | ORAL_TABLET | Freq: Every evening | ORAL | Status: DC | PRN
  Filled 2020-01-22 (×2): qty 1

## 2020-01-22 MED ORDER — PIPERACILLIN-TAZOBACTAM IN DEX 2-0.25 GM/50ML IV SOLN
2.2500 g | Freq: Three times a day (TID) | INTRAVENOUS | Status: DC
  Administered 2020-01-22 – 2020-01-24 (×4): 2.25 g via INTRAVENOUS
  Filled 2020-01-22 (×8): qty 50

## 2020-01-22 MED ORDER — HEPARIN SODIUM (PORCINE) 5000 UNIT/ML IJ SOLN
5000.0000 [IU] | Freq: Three times a day (TID) | INTRAMUSCULAR | Status: DC
  Administered 2020-01-22 – 2020-01-25 (×7): 5000 [IU] via SUBCUTANEOUS
  Filled 2020-01-22 (×7): qty 1

## 2020-01-22 MED ORDER — FERRIC CITRATE 1 GM 210 MG(FE) PO TABS
420.0000 mg | ORAL_TABLET | Freq: Two times a day (BID) | ORAL | Status: DC
  Administered 2020-01-22 – 2020-01-25 (×5): 420 mg via ORAL
  Filled 2020-01-22 (×7): qty 2

## 2020-01-22 MED ORDER — INSULIN ASPART 100 UNIT/ML ~~LOC~~ SOLN
2.0000 [IU] | Freq: Three times a day (TID) | SUBCUTANEOUS | Status: DC
  Administered 2020-01-23 – 2020-01-24 (×3): 2 [IU] via SUBCUTANEOUS

## 2020-01-22 MED ORDER — CINACALCET HCL 30 MG PO TABS
90.0000 mg | ORAL_TABLET | ORAL | Status: DC
  Administered 2020-01-23 – 2020-01-25 (×2): 90 mg via ORAL
  Filled 2020-01-22 (×2): qty 3

## 2020-01-22 MED ORDER — INSULIN ASPART 100 UNIT/ML ~~LOC~~ SOLN
0.0000 [IU] | Freq: Three times a day (TID) | SUBCUTANEOUS | Status: DC
  Administered 2020-01-23: 2 [IU] via SUBCUTANEOUS
  Administered 2020-01-24: 1 [IU] via SUBCUTANEOUS

## 2020-01-22 MED ORDER — PANTOPRAZOLE SODIUM 40 MG PO TBEC
40.0000 mg | DELAYED_RELEASE_TABLET | Freq: Every day | ORAL | Status: DC
  Administered 2020-01-22 – 2020-01-25 (×4): 40 mg via ORAL
  Filled 2020-01-22 (×4): qty 1

## 2020-01-22 MED ORDER — LACTATED RINGERS IV BOLUS
500.0000 mL | Freq: Once | INTRAVENOUS | Status: AC
  Administered 2020-01-22: 500 mL via INTRAVENOUS

## 2020-01-22 MED ORDER — SODIUM CHLORIDE 0.9 % IV SOLN: INTRAVENOUS | Status: AC

## 2020-01-22 MED ORDER — POLYETHYLENE GLYCOL 3350 17 G PO PACK: 17.0000 g | PACK | Freq: Every day | ORAL | Status: DC | PRN

## 2020-01-22 MED ORDER — METRONIDAZOLE IN NACL 5-0.79 MG/ML-% IV SOLN
500.0000 mg | Freq: Once | INTRAVENOUS | Status: AC
  Administered 2020-01-22: 500 mg via INTRAVENOUS
  Filled 2020-01-22: qty 100

## 2020-01-22 MED ORDER — INSULIN ASPART 100 UNIT/ML ~~LOC~~ SOLN: 0.0000 [IU] | Freq: Every day | SUBCUTANEOUS | Status: DC

## 2020-01-22 MED ORDER — ACETAMINOPHEN 325 MG PO TABS
650.0000 mg | ORAL_TABLET | Freq: Four times a day (QID) | ORAL | Status: DC | PRN
  Administered 2020-01-23 – 2020-01-24 (×3): 650 mg via ORAL
  Filled 2020-01-22 (×3): qty 2

## 2020-01-22 MED ORDER — SENNOSIDES-DOCUSATE SODIUM 8.6-50 MG PO TABS: 2.0000 | ORAL_TABLET | Freq: Every evening | ORAL | Status: DC | PRN

## 2020-01-22 MED ORDER — INSULIN DETEMIR 100 UNIT/ML ~~LOC~~ SOLN
20.0000 [IU] | Freq: Every day | SUBCUTANEOUS | Status: DC
  Administered 2020-01-22 – 2020-01-24 (×3): 20 [IU] via SUBCUTANEOUS
  Filled 2020-01-22 (×4): qty 0.2

## 2020-01-22 NOTE — H&P (Signed)
History and Physical    [name] QPY:[id number] DOB: 07/18/1956 DOA: 01/22/2020  PCP: [name], MD Patient coming from: Dialysis center  Chief Complaint: Hypotension  HPI: [name] is a 64 y.o. male with medical history significant of recent Streptococcus bacteremia on outpatient Augmentin, COVID-19 infection status post routine treatment still under isolation, ESRD hemodialysis, DM2, HTN, HLD, RCC status post nephrectomy was brought to the hospital for evaluation of hypotension and hypothermia.  Patient was discharged yesterday from the hospital after being treated for COVID-19 infection along with Streptococcus bacteremia.  During previous infection his hemodialysis catheter was exchanged.  After going home he states he was doing well.  He thinks he took his blood pressure medication this morning determined to his dialysis center, 15 minutes into his dialysis he was noted to be hypotensive.  Small fluid bolus was given without much response therefore brought to the hospital.  In the hospital initially his systolic blood pressure was in 50s which responded to IV fluid, his rectal temperature was 96.6.  Cultures were performed and started on Rocephin and Flagyl.  Medical team was requested to admit the patient  When I saw the patient his systolic blood pressure was in low 100s, appeared quite comfortable saturating 95% on 2 L nasal cannula.  Tells me he thinks he took his a.m. blood pressure medications and did not realize he was not supposed to take them prior to his dialysis.   Review of Systems: As per HPI otherwise 10 point review of systems negative.  Review of Systems Otherwise negative except as per HPI, including: General: Denies fever, chills, night sweats or unintended weight loss. Resp: Denies cough, wheezing, shortness of breath. Cardiac: Denies chest pain, palpitations, orthopnea, paroxysmal nocturnal dyspnea. GI: Denies abdominal pain, nausea, vomiting, diarrhea  or constipation GU: Denies dysuria, frequency, hesitancy or incontinence MS: Denies muscle aches, joint pain or swelling Neuro: Denies headache, neurologic deficits (focal weakness, numbness, tingling), abnormal gait Psych: Denies anxiety, depression, SI/HI/AVH Skin: Denies new rashes or lesions ID: Denies sick contacts, exotic exposures, travel  Past Medical History:  Diagnosis Date  . Alcohol abuse   . Congestive heart disease ([place]) 12/2011  . Diabetes mellitus   . GERD (gastroesophageal reflux disease)   . Gout   . Headache   . Hepatitis    being treated for Hepatitis C  . HOH (hard of hearing)   . Hyperlipidemia   . Hyperlipidemia   . Hypertension   . Monoclonal gammopathies 02/25/2012  . Obesity   . Pedal edema   . Pneumonia   . Renal insufficiency   . Sleep apnea    uses cpap  . Tobacco abuse   . Wears hearing aid    b/l    Past Surgical History:  Procedure Laterality Date  . AV FISTULA PLACEMENT Right 01/10/2014   Procedure: ARTERIOVENOUS (AV) FISTULA CREATION- RIGHT RADIOCEPHALIC ;  Surgeon: Angelia Mould, MD;  Location: [place];  Service: Vascular;  Laterality: Right;  . COLONOSCOPY    . EYE SURGERY     cataract surgery, bilateral  . Ganglion cyst removal x 2    . INSERTION OF DIALYSIS CATHETER N/A 01/07/2014   Procedure: INSERTION OF DIALYSIS CATHETER;  Surgeon: Conrad Williamston, MD;  Location: [facility];  Service: Vascular;  Laterality: N/A;  . IR REMOVAL TUN CV CATH W/O FL  01/16/2020  . Left knee arthroscopy with generalized joint synovectomy,  01/2006  . LIGATION OF COMPETING BRANCHES OF ARTERIOVENOUS FISTULA Right  05/28/2014   Procedure: LIGATION OF COMPETING BRANCHES OF ARTERIOVENOUS FISTULA;  Surgeon: Angelia Mould, MD;  Location: [place];  Service: Vascular;  Laterality: Right;  . REVISON OF ARTERIOVENOUS FISTULA Right 5/0/0938   Procedure: PLICATION REPAIR OF ARTERIOVENOUS FISTULA PSEUDOANEURYSM;  Surgeon: Serafina Mitchell, MD;  Location: [place];   Service: Vascular;  Laterality: Right;  . REVISON OF ARTERIOVENOUS FISTULA Right 08/08/2018   Procedure: REVISION PLICATION OF ARTERIOVENOUS FISTULA FOREARM;  Surgeon: Angelia Mould, MD;  Location: [place];  Service: Vascular;  Laterality: Right;  . REVISON OF ARTERIOVENOUS FISTULA Right 08/02/2019   Procedure: Artegraft placement right arm arteriovenous graft;  Surgeon: Waynetta Sandy, MD;  Location: [place];  Service: Vascular;  Laterality: Right;  . SCROTAL EXPLORATION N/A 03/11/2017   Procedure: SCROTUM EXPLORATION AND DEBRIDEMENT;  Surgeon: Franchot Gallo, MD;  Location: [place];  Service: Urology;  Laterality: N/A;  . SCROTAL EXPLORATION N/A 03/21/2017   Procedure: REPEAT SCROTAL DEBRIDEMENT;  Surgeon: Franchot Gallo, MD;  Location: [place];  Service: Urology;  Laterality: N/A;  . SHUNTOGRAM Right 05/14/2014   Procedure: FISTULOGRAM;  Surgeon: Serafina Mitchell, MD;  Location: [facility] CATH LAB;  Service: Cardiovascular;  Laterality: Right;  . THROMBECTOMY W/ EMBOLECTOMY Right 07/04/2017   Procedure: EXPLORATION AND THROMBECTOMY ARTERIOVENOUS FISTULA RIGHT ARM;  Surgeon: Rosetta Posner, MD;  Location: [place];  Service: Vascular;  Laterality: Right;    SOCIAL HISTORY:  reports that he quit smoking about 9 years ago. His smoking use included cigarettes. He has a 9.90 pack-year smoking history. He has never used smokeless tobacco. He reports that he does not drink alcohol or use drugs.  Allergies  Allergen Reactions  . Penicillins Nausea And Vomiting and Other (See Comments)    Tolerated Augmentin. Has patient had a PCN reaction causing immediate rash, facial/tongue/throat swelling, SOB or lightheadedness with hypotension: No Has patient had a PCN reaction causing severe rash involving mucus membranes or skin necrosis: No Has patient had a PCN reaction that required hospitalization No Has patient had a PCN reaction occurring within the last 10 years: No If all of the above answers are  "NO", then may proceed with Cephalosporin use.  Marland Kitchen Propolis Nausea And Vomiting  . Vioxx [Rofecoxib] Nausea And Vomiting    FAMILY HISTORY: Family History  Problem Relation Age of Onset  . Diabetes Mother   . Heart disease Mother   . Lung cancer Father      Prior to Admission medications   Medication Sig Start Date End Date Taking? Authorizing Provider  acetaminophen (TYLENOL) 325 MG tablet Take 650 mg by mouth every 6 (six) hours as needed for mild pain, moderate pain, fever or headache.    Yes [provider]  allopurinol (ZYLOPRIM) 100 MG tablet Take 1 tablet (100 mg total) by mouth daily. Patient taking differently: Take 100 mg by mouth 2 (two) times daily.  03/14/17  Yes Rai, Ripudeep K, MD  amoxicillin-clavulanate (AUGMENTIN) 500-125 MG tablet Take 1 tablet (500 mg total) by mouth 2 (two) times daily for 5 days. Take through 01/20/2020 01/21/20 01/26/20 Yes Thurnell Lose, MD  AURYXIA 1 GM 210 MG(Fe) tablet Take 420 mg by mouth 2 (two) times daily with a meal.  07/14/18  Yes [provider]  carvedilol (COREG) 3.125 MG tablet Take 1 tablet (3.125 mg total) by mouth 2 (two) times daily. 01/15/20 01/14/21 Yes Thurnell Lose, MD  cinacalcet (SENSIPAR) 90 MG tablet Take 90 mg by mouth every Monday,  Wednesday, and Friday.    Yes [provider]  diphenhydrAMINE (BENADRYL) 25 MG tablet Take 25 mg by mouth every Monday, Wednesday, and Friday.   Yes [provider]  furosemide (LASIX) 40 MG tablet Take 40 mg by mouth daily.   Yes [provider]  Insulin Detemir (LEVEMIR FLEXTOUCH) 100 UNIT/ML Pen Inject 20 Units into the skin at bedtime.    Yes [provider]  mirtazapine (REMERON) 15 MG tablet Take 15 mg by mouth at bedtime as needed (sleep).   Yes [provider]  omeprazole (PRILOSEC) 20 MG capsule Take 20 mg by mouth daily.  08/03/19  Yes [provider]  ondansetron (ZOFRAN) 4 MG tablet Take 4 mg by mouth 3 (three)  times daily as needed for nausea/vomiting. 01/07/20  Yes [provider]  pravastatin (PRAVACHOL) 20 MG tablet Take 20 mg by mouth daily.    Yes [provider]    Physical Exam: Vitals:   01/22/20 1645 01/22/20 1700 01/22/20 1715 01/22/20 1735  BP: (!) 95/52 (!) 99/52 106/66 114/64  Pulse:  77  80  Resp: 16 15 15 16   Temp:      TempSrc:      SpO2:  99%  100%  Weight:      Height:          Constitutional: NAD, calm, comfortable Eyes: PERRL, lids and conjunctivae normal ENMT: Mucous membranes are moist. Posterior pharynx clear of any exudate or lesions.Normal dentition.  Neck: normal, supple, no masses, no thyromegaly Respiratory: Minimal bibasilar rhonchi Cardiovascular: Regular rate and rhythm, no murmurs / rubs / gallops. No extremity edema. 2+ pedal pulses. No carotid bruits.  Abdomen: no tenderness, no masses palpated. No hepatosplenomegaly. Bowel sounds positive.  Musculoskeletal: no clubbing / cyanosis. No joint deformity upper and lower extremities. Good ROM, no contractures. Normal muscle tone.  Skin: no rashes, lesions, ulcers. No induration Neurologic: CN 2-12 grossly intact. Sensation intact, DTR normal. Strength 5/5 in all 4.  Psychiatric: Normal judgment and insight. Alert and oriented x 3. Normal mood.   Right upper extremity previous hemodialysis access noted Left neck hemodialysis catheter noted without any evidence of bleeding or obvious infection.  Labs on Admission: I have personally reviewed following labs and imaging studies  CBC: Recent Labs  Lab 01/17/20 0310 01/18/20 0816 01/19/20 0446 01/20/20 0309 01/22/20 1515  WBC 23.9* 26.3* 19.3* 12.1* 15.6*  NEUTROABS 16.6* 21.6* 16.6* 8.5* 12.5*  HGB 10.6* 11.1* 10.9* 10.5* 10.7*  HCT 34.9* 36.0* 37.0* 35.6* 36.8*  MCV 75.5* 76.1* 77.4* 78.2* 80.0  PLT 257 219 238 203 053   Basic Metabolic Panel: Recent Labs  Lab 01/16/20 0311 01/16/20 0311 01/17/20 0310 01/17/20 1048  01/18/20 0816 01/19/20 0446 01/20/20 0309 01/22/20 1515  NA 132*   < > 135  --  136 138 138 140  K 3.9   < > 3.4*  --  4.6 3.8 4.1 4.5  CL 97*   < > 99  --  100 100 102 106  CO2 18*   < > 17*  --  15* 18* 17* 13*  GLUCOSE 319*   < > 262*  --  209* 149* 142* 164*  BUN 77*   < > 59*  --  82* 94* 95* 92*  CREATININE 12.49*   < > 10.94*  --  14.26* 16.56* 16.37* 18.13*  CALCIUM 8.9   < > 8.4*  --  9.0 8.5* 8.4* 8.4*  MG 1.9  --  2.0  --  2.1 2.3 2.2  --   PHOS 6.6*  --   --  5.0*  --   --   --   --    < > = values in this interval not displayed.   GFR: Estimated Creatinine Clearance: 5.9 mL/min (A) (by C-G formula based on SCr of 18.13 mg/dL (H)). Liver Function Tests: Recent Labs  Lab 01/17/20 0310 01/18/20 0816 01/19/20 0446 01/20/20 0309 01/22/20 1515  AST 13* 13* 11* 10* 12*  ALT 13 11 13 12 13   ALKPHOS 98 78 78 90 97  BILITOT 0.5 0.4 0.5 0.6 0.7  PROT 6.7 6.9 6.8 6.7 7.1  ALBUMIN 2.6* 2.8* 2.8* 2.8* 3.1*   No results for input(s): LIPASE, AMYLASE in the last 168 hours. No results for input(s): AMMONIA in the last 168 hours. Coagulation Profile: Recent Labs  Lab 01/18/20 0816 01/22/20 1515  INR 1.2 1.2   Cardiac Enzymes: No results for input(s): CKTOTAL, CKMB, CKMBINDEX, TROPONINI in the last 168 hours. BNP (last 3 results) No results for input(s): PROBNP in the last 8760 hours. HbA1C: No results for input(s): HGBA1C in the last 72 hours. CBG: Recent Labs  Lab 01/20/20 0753 01/20/20 1220 01/20/20 1751 01/21/20 0146 01/21/20 0759  GLUCAP 140* 216* 121* 173* 174*   Lipid Profile: No results for input(s): CHOL, HDL, LDLCALC, TRIG, CHOLHDL, LDLDIRECT in the last 72 hours. Thyroid Function Tests: No results for input(s): TSH, T4TOTAL, FREET4, T3FREE, THYROIDAB in the last 72 hours. Anemia Panel: No results for input(s): VITAMINB12, FOLATE, FERRITIN, TIBC, IRON, RETICCTPCT in the last 72 hours. Urine analysis:    Component Value Date/Time   COLORURINE  YELLOW 01/10/2012 1404   APPEARANCEUR CLEAR 01/10/2012 1404   LABSPEC 1.023 01/10/2012 1404   PHURINE 6.0 01/10/2012 1404   GLUCOSEU >1000 (A) 01/10/2012 1404   HGBUR TRACE (A) 01/10/2012 1404   BILIRUBINUR NEGATIVE 01/10/2012 1404   KETONESUR NEGATIVE 01/10/2012 1404   PROTEINUR >300 (A) 01/10/2012 1404   UROBILINOGEN 1.0 01/10/2012 1404   NITRITE NEGATIVE 01/10/2012 1404   LEUKOCYTESUR NEGATIVE 01/10/2012 1404   Sepsis Labs: !!!!!!!!!!!!!!!!!!!!!!!!!!!!!!!!!!!!!!!!!!!! @LABRCNTIP (procalcitonin:4,lacticidven:4) ) Recent Results (from the past 240 hour(s))  Culture, blood (routine x 2)     Status: None   Collection Time: 01/14/20 12:10 PM   Specimen: BLOOD  Result Value Ref Range Status   Specimen Description BLOOD LEFT ANTECUBITAL  Final   Special Requests   Final    BOTTLES DRAWN AEROBIC ONLY Blood Culture results may not be optimal due to an inadequate volume of blood received in culture bottles   Culture   Final    NO GROWTH 5 DAYS Performed at [facility], [place] [address], [place], [place] [address]    Report Status 01/19/2020 FINAL  Final  Culture, blood (routine x 2)     Status: None   Collection Time: 01/14/20 12:13 PM   Specimen: BLOOD LEFT HAND  Result Value Ref Range Status   Specimen Description BLOOD LEFT HAND  Final   Special Requests   Final    BOTTLES DRAWN AEROBIC ONLY Blood Culture results may not be optimal due to an inadequate volume of blood received in culture bottles   Culture   Final    NO GROWTH 5 DAYS Performed at [facility], [place] [address], [place],  [address]    Report Status 01/19/2020 FINAL  Final     Radiological Exams on Admission: DG Chest Port 1 View  Result Date: 01/22/2020 CLINICAL DATA:  Hypotension. EXAM: PORTABLE CHEST 1 VIEW COMPARISON:  Radiograph 01/14/2020, CT 01/13/2020 FINDINGS: Left dialysis catheter with tip at the atrial caval junction. Cardiomegaly is similar with unchanged mediastinal contours. Patchy  right upper lobe opacities which are similar to recent prior exam. No new airspace disease. No pulmonary edema, large pleural effusion or pneumothorax. No acute osseous abnormalities are seen. IMPRESSION: Patchy right upper lobe opacities, similar to recent prior exam. Stable cardiomegaly. No acute findings. Electronically Signed   By: Keith Rake M.D.   On: 01/22/2020 15:36     All images have been reviewed by me personally.   Assessment/Plan Principal Problem:   Sepsis ([place]) Active Problems:   Hypertension   Tobacco abuse   Diabetes mellitus ([place])   End stage renal disease ([facility])   Chronic hepatitis C without hepatic coma ([facility])   ESRD (end stage renal disease) on dialysis ([facility])   Gram-neg septicemia ([facility])   Diabetes mellitus type 2, uncontrolled ([place])    Sepsis POA recent Streptococcus bacteremia Positive COVID-19 infection/previously treated -Admit patient under airborne precaution.  Pancultures performed.  Dialysis catheter site looks okay.  Empirically started IV Zosyn, pharmacy consulted.  Possibly a component of taking blood pressure medications prior to his dialysis. -Continue gentle hydration overnight.  Maintain MAP greater than 65 -Supportive care. -Check procalcitonin.  No evidence of dialysis catheter/line infection at the moment  ESRD on hemodialysis Monday Wednesday Friday? -Still into HD today, if blood pressure remains stable please notify nephrology in the morning for dialysis perhaps tomorrow.  Continue home renal medications  History of right-sided nephrectomy due to renal cell carcinoma Right upper lobe mass -Neutropenia, previously planned for repeat CT in about 2-3 weeks with primary care physician as outpatient once underlying pneumonia has improved  Anemia of chronic disease -Hemoglobin stable  Hyperlipidemia -Statin  Essential hypertension -Antihypertensives on hold for now  History of left foot Charcot joint Diabetes mellitus type 2,  uncontrolled secondary to hyperglycemia -Noninfected 1 cm shallow ulcer.  Supportive care. -Previous hemoglobin A1c 10.5.  Insulin sliding scale and Accu-Chek.  Obstructive sleep apnea -Not on CPAP at home  GERD -PPI   DVT prophylaxis: Subcutaneous heparin Code Status: Full code Family Communication: None Disposition Plan: None Consults called: Nephrology will need to be notified in the morning Admission status: Admit patient to inpatient progressive care due to soft blood pressure.  Will require airborne precaution as patient is still within 21 days of COVID-19 diagnosis.   Time Spent: 65 minutes.  >50% of the time was devoted to discussing the patients care, assessment, plan and disposition with other care givers along with counseling the patient about the risks and benefits of treatment.    Temiloluwa [place] Arsenio Loader MD [facility]  If 7PM-7AM, please contact night-coverage   01/22/2020, 6:03 PM

## 2020-01-22 NOTE — ED Provider Notes (Signed)
[facility] EMERGENCY DEPARTMENT Provider Note   CSN: [id number] Arrival date & time: 01/22/20  1405     History Chief Complaint  Patient presents with  . dialysis/ hypotensive    [name] is a 64 y.o. male.  HPI Patient discharged in the hospital yesterday.  Medical history includes ESRD on dialysis.  He was admitted with hypoxic respiratory failure, Covid and bacterial pneumonia.  Patient reports he felt all right this morning when he got up.  He had a pretty normal morning.  He ate some food.  He went to dialysis this afternoon.  He reports that was about 12.  Just shortly before starting dialysis he started to feel lightheaded and weak.  When he got there he let them know and his blood pressures were low.  Reports they gave him fluids but blood pressure did not improve significantly.  At that time EMS was called.  Patient denies he is having any pain.  He has no headache or chest pain.  No syncope.  He does not feel short of breath.  Reports he has been having small amounts of diarrhea for a while now.  He has chronic Charcot foot on the left.  He reports he always wears a dressing on it.  This is been like it for years with a noninfected ulcer.  Reports he has got a ulcer on the right lateral lower leg that is healing.  He reports is much better.  No redness or pain around it.  Patient reports he is compliant with his medications.  He he denies any drugs of abuse.  He denies alcohol use.    Past Medical History:  Diagnosis Date  . Alcohol abuse   . Congestive heart disease ([place]) 12/2011  . Diabetes mellitus   . GERD (gastroesophageal reflux disease)   . Gout   . Headache   . Hepatitis    being treated for Hepatitis C  . HOH (hard of hearing)   . Hyperlipidemia   . Hyperlipidemia   . Hypertension   . Monoclonal gammopathies 02/25/2012  . Obesity   . Pedal edema   . Pneumonia   . Renal insufficiency   . Sleep apnea    uses cpap  . Tobacco abuse   .  Wears hearing aid    b/l    Patient Active Problem List   Diagnosis Date Noted  . Pressure injury of skin 01/12/2020  . Pneumonia due to COVID-19 virus 01/11/2020  . Hyponatremia 01/11/2020  . Hyperkalemia 01/11/2020  . Anemia 01/11/2020  . Respiratory failure with hypoxia ([place]) 01/11/2020  . BMI 37.0-37.9, adult 01/11/2020  . Effusion of lower leg joint 07/13/2018  . Absolute anemia 07/13/2018  . Benign essential HTN 07/13/2018  . Carpal tunnel syndrome of right wrist 05/19/2017  . Hand pain, right 05/10/2017  . Gram-neg septicemia ([place])   . Scrotal edema   . ESRD (end stage renal disease) on dialysis ([place])   . Scrotal pain   . Sepsis ([place])   . Acute respiratory failure with hypoxia ([place]) 03/04/2017  . Scrotal swelling 03/04/2017  . Chronic hepatitis C without hepatic coma ([place]) 11/17/2016  . Pulmonary nodule 04/02/2014  . Sleep apnea 04/02/2014  . Allergic rhinitis 04/02/2014  . End stage renal disease ([place]) 02/13/2014  . Diabetes mellitus type 2, uncontrolled ([place]) 01/21/2014  . PNA (pneumonia) 01/21/2014  . Pedal edema   . Monoclonal gammopathies 02/25/2012  . Obesity 01/10/2012  . Chronic kidney disease 01/10/2012  .  Diabetes mellitus ([place]) 01/10/2012  . Hypertension   . Hyperlipidemia   . Gout   . Alcohol abuse   . Tobacco abuse     Past Surgical History:  Procedure Laterality Date  . AV FISTULA PLACEMENT Right 01/10/2014   Procedure: ARTERIOVENOUS (AV) FISTULA CREATION- RIGHT RADIOCEPHALIC ;  Surgeon: Angelia Mould, MD;  Location: [place];  Service: Vascular;  Laterality: Right;  . COLONOSCOPY    . EYE SURGERY     cataract surgery, bilateral  . Ganglion cyst removal x 2    . INSERTION OF DIALYSIS CATHETER N/A 01/07/2014   Procedure: INSERTION OF DIALYSIS CATHETER;  Surgeon: Conrad [place], MD;  Location: [facility] OR;  Service: Vascular;  Laterality: N/A;  . IR REMOVAL TUN CV CATH W/O FL  01/16/2020  . Left knee arthroscopy with generalized joint synovectomy,   01/2006  . LIGATION OF COMPETING BRANCHES OF ARTERIOVENOUS FISTULA Right 05/28/2014   Procedure: LIGATION OF COMPETING BRANCHES OF ARTERIOVENOUS FISTULA;  Surgeon: Angelia Mould, MD;  Location: [place];  Service: Vascular;  Laterality: Right;  . REVISON OF ARTERIOVENOUS FISTULA Right 0/06/6760   Procedure: PLICATION REPAIR OF ARTERIOVENOUS FISTULA PSEUDOANEURYSM;  Surgeon: Serafina Mitchell, MD;  Location: [place];  Service: Vascular;  Laterality: Right;  . REVISON OF ARTERIOVENOUS FISTULA Right 08/08/2018   Procedure: REVISION PLICATION OF ARTERIOVENOUS FISTULA FOREARM;  Surgeon: Angelia Mould, MD;  Location: [place];  Service: Vascular;  Laterality: Right;  . REVISON OF ARTERIOVENOUS FISTULA Right 08/02/2019   Procedure: Artegraft placement right arm arteriovenous graft;  Surgeon: Waynetta Sandy, MD;  Location: [place];  Service: Vascular;  Laterality: Right;  . SCROTAL EXPLORATION N/A 03/11/2017   Procedure: SCROTUM EXPLORATION AND DEBRIDEMENT;  Surgeon: Franchot Gallo, MD;  Location: [place];  Service: Urology;  Laterality: N/A;  . SCROTAL EXPLORATION N/A 03/21/2017   Procedure: REPEAT SCROTAL DEBRIDEMENT;  Surgeon: Franchot Gallo, MD;  Location: [place];  Service: Urology;  Laterality: N/A;  . SHUNTOGRAM Right 05/14/2014   Procedure: FISTULOGRAM;  Surgeon: Serafina Mitchell, MD;  Location: [facility] CATH LAB;  Service: Cardiovascular;  Laterality: Right;  . THROMBECTOMY W/ EMBOLECTOMY Right 07/04/2017   Procedure: EXPLORATION AND THROMBECTOMY ARTERIOVENOUS FISTULA RIGHT ARM;  Surgeon: Rosetta Posner, MD;  Location: [facility] OR;  Service: Vascular;  Laterality: Right;       Family History  Problem Relation Age of Onset  . Diabetes Mother   . Heart disease Mother   . Lung cancer Father     Social History   Tobacco Use  . Smoking status: Former Smoker    Packs/day: 0.30    Years: 33.00    Pack years: 9.90    Types: Cigarettes    Quit date: 07/16/2010    Years since quitting: 9.5  .  Smokeless tobacco: Never Used  Substance Use Topics  . Alcohol use: No  . Drug use: No    Home Medications Prior to Admission medications   Medication Sig Start Date End Date Taking? Authorizing Provider  acetaminophen (TYLENOL) 325 MG tablet Take 650 mg by mouth every 6 (six) hours as needed for mild pain, moderate pain, fever or headache.     [provider]  allopurinol (ZYLOPRIM) 100 MG tablet Take 1 tablet (100 mg total) by mouth daily. Patient taking differently: Take 100 mg by mouth 2 (two) times daily.  03/14/17   Rai, Vernelle Emerald, MD  amoxicillin-clavulanate (AUGMENTIN) 500-125 MG tablet Take 1 tablet (500 mg total) by mouth  2 (two) times daily for 5 days. Take through 01/20/2020 01/21/20 01/26/20  Thurnell Lose, MD  AURYXIA 1 GM 210 MG(Fe) tablet Take 420 mg by mouth 2 (two) times daily with a meal.  07/14/18   [provider]  carvedilol (COREG) 3.125 MG tablet Take 1 tablet (3.125 mg total) by mouth 2 (two) times daily. 01/15/20 01/14/21  Thurnell Lose, MD  cinacalcet (SENSIPAR) 90 MG tablet Take 90 mg by mouth every Monday, Wednesday, and Friday.     [provider]  diphenhydrAMINE (BENADRYL) 25 MG tablet Take 25 mg by mouth every Monday, Wednesday, and Friday.    [provider]  furosemide (LASIX) 40 MG tablet Take 40 mg by mouth daily.    [provider]  Insulin Detemir (LEVEMIR FLEXTOUCH) 100 UNIT/ML Pen Inject 20 Units into the skin at bedtime.     [provider]  mirtazapine (REMERON) 15 MG tablet Take 15 mg by mouth at bedtime as needed (sleep).    [provider]  omeprazole (PRILOSEC) 20 MG capsule Take 20 mg by mouth daily.  08/03/19   [provider]  ondansetron (ZOFRAN) 4 MG tablet Take 4 mg by mouth 3 (three) times daily as needed for nausea/vomiting. 01/07/20   [provider]  pravastatin (PRAVACHOL) 20 MG tablet Take 20 mg by mouth daily.     [provider]    Allergies     Penicillins, Propolis, and Vioxx [rofecoxib]  Review of Systems   Review of Systems 10 Systems reviewed and are negative for acute change except as noted in the HPI. Physical Exam Updated Vital Signs BP (!) 58/35   Pulse 93   Temp 97.6 F (36.4 C) (Oral)   Resp (!) 27   SpO2 97%   Physical Exam Constitutional:      Comments: Patient is alert with clear mental status.  He is supine in the stretcher.  No respiratory distress.  HENT:     Head: Normocephalic and atraumatic.     Mouth/Throat:     Mouth: Mucous membranes are moist.     Pharynx: Oropharynx is clear.     Comments: Dukas membranes are moist.  Oral cavity is clear. Eyes:     Comments: Pupils are 2 mm and symmetric.  Extraocular motions intact without nystagmus.  Cardiovascular:     Rate and Rhythm: Normal rate and regular rhythm.  Pulmonary:     Comments: No respiratory distress.  Lungs are clear without crackles. Abdominal:     Comments: Abdomen is soft.  No distention or firmness.  No guarding.  Musculoskeletal:     Comments: Examination of back and sacrum and buttocks is grossly normal.  Patient has some nearly healed pressure wounds at the top of the gluteal cleft.  Condition is otherwise good.  No acute appearing wounds.  The ones present are limited and in good condition.  Peri anal area normal in appearance.  No significant peripheral edema.  Patient has a wrap on his left foot.  Toes are normal.  Small rectangular bandage on the lateral right leg.  The leg is in good condition without cellulitis.  Right foot normal without wounds or erythema.  Remedies are warm and dry.  Skin:    General: Skin is warm and dry.  Neurological:     General: No focal deficit present.     Mental Status: He is oriented to person, place, and time.     Coordination: Coordination normal.  Psychiatric:  Mood and Affect: Mood normal.     ED Results / Procedures / Treatments   Labs (all labs ordered are listed, but only  abnormal results are displayed) Labs Reviewed  CULTURE, BLOOD (ROUTINE X 2)  CULTURE, BLOOD (ROUTINE X 2)  URINE CULTURE  LACTIC ACID, PLASMA  LACTIC ACID, PLASMA  COMPREHENSIVE METABOLIC PANEL  CBC WITH DIFFERENTIAL/PLATELET  APTT  PROTIME-INR  URINALYSIS, ROUTINE W REFLEX MICROSCOPIC    EKG EKG Interpretation  Date/Time:  Tuesday January 22 2020 14:54:26 EST Ventricular Rate:  94 PR Interval:    QRS Duration: 103 QT Interval:  388 QTC Calculation: 486 R Axis:   15 Text Interpretation: Sinus rhythm Low voltage, extremity and precordial leads no change from previous Confirmed by Charlesetta Shanks [id number]) on 01/22/2020 3:17:28 PM   Radiology No results found.  Procedures Procedures (including critical care time) CRITICAL CARE Performed by: Charlesetta Shanks   Total critical care time: 45 minutes  Critical care time was exclusive of separately billable procedures and treating other patients.  Critical care was necessary to treat or prevent imminent or life-threatening deterioration.  Critical care was time spent personally by me on the following activities: development of treatment plan with patient and/or surrogate as well as nursing, discussions with consultants, evaluation of patient's response to treatment, examination of patient, obtaining history from patient or surrogate, ordering and performing treatments and interventions, ordering and review of laboratory studies, ordering and review of radiographic studies, pulse oximetry and re-evaluation of patient's condition.  Medications Ordered in ED Medications - No data to display  ED Course  I have reviewed the triage vital signs and the nursing notes.  Pertinent labs & imaging results that were available during my care of the patient were reviewed by me and considered in my medical decision making (see chart for details).    MDM Rules/Calculators/A&P                     Patient presents from dialysis with very low  blood pressure.  Clinically, he does not appear hypoperfused.  His mental status is clear.  His skin is warm and dry to the touch.  Extremities are warm and dry.  No respiratory distress, headache or chest pain.  We will proceed with diagnostic evaluation and supportive care.  Patient has been given IV fluid bolus at dialysis.  He does not have any crackles at this time.  Will add another 500 cc bolus.  Will start Rocephin and Flagyl based on prior streptococcal pneumonia at time of discharge yesterday.  Pressure has improved with fluid resuscitation.  Patient was given two 500 cc boluses of lactated Ringer's.  It was reported by the the patient that he got fluids at the dialysis center but exact amount is not available at this time.  Patient's clinical appearance did not suggest hypoperfusion.  On first evaluation, his mental status was clear with no confusion or lethargy, he had no respiratory distress, he was not tachycardic and extremities were warm, dry and well-perfused.  He did not subjectively feel lightheaded, nauseated or generally weak.  With the patient's known kidney failure and clinical appearance not suggesting significant hypoperfusion, I did opt to use incremental fluid boluses to treat hypotension with close observation of the patient's response to treatment and clinical condition.  Within the differential also was possible hypotension exacerbated by medications.  Patient reported taking his blood pressure medications in the morning which he apparently had not been on consistently due to previously  low blood pressures.  Was the patient's thought that  this might be due to his blood pressure medications which, given his generally well clinical appearance I agree is within the differential although appropriate management was for sepsis treatment until proven otherwise.  Patient admitted to medical service with blood pressures having responded to the 90s and low 100s heart rate in the 80s, mental  status clear and no respiratory distress. Final Clinical Impression(s) / ED Diagnoses Final diagnoses:  Sepsis, due to unspecified organism, unspecified whether acute organ dysfunction present [facility])  Severe comorbid illness    Rx / DC Orders ED Discharge Orders    None       Charlesetta Shanks, MD 01/22/20 [id number]

## 2020-01-22 NOTE — ED Notes (Signed)
Able to palpate BP at 92

## 2020-01-22 NOTE — Plan of Care (Signed)
Patient arrived onto unit from ED. Oriented to room. Skin assessment completed with charge nurse. Old wound to right leg measured at 3.5cm x 3.2cm. left knee appears swollen and patient states it was due to a fall today at home and the pain is gradually getting worse. Left leg is wrapped with ACE compression wrap and dressing to right leg is changed. Pt is resting at this time, no complaints. Will continue to monitor during this shift.

## 2020-01-22 NOTE — ED Triage Notes (Addendum)
Pt presents for "slip and fall but didn't hit anything" this AM, drove to dialysis and was found to be hypotensive and dizzy, sent to ED via EMS.  Receives dialysis MWF  Alert and oriented x4. Manual BP 50s in ED lobby

## 2020-01-22 NOTE — Progress Notes (Signed)
Notified provider of need to document reasoning for not receiving complete weight based fluid bolus.

## 2020-01-23 DIAGNOSIS — I959 Hypotension, unspecified: Secondary | ICD-10-CM

## 2020-01-23 LAB — CBC
HCT: 32 % — ABNORMAL LOW (ref 39.0–52.0)
Hemoglobin: 9.4 g/dL — ABNORMAL LOW (ref 13.0–17.0)
MCH: 23.5 pg — ABNORMAL LOW (ref 26.0–34.0)
MCHC: 29.4 g/dL — ABNORMAL LOW (ref 30.0–36.0)
MCV: 80 fL (ref 80.0–100.0)
Platelets: 162 10*3/uL (ref 150–400)
RBC: 4 MIL/uL — ABNORMAL LOW (ref 4.22–5.81)
RDW: 20.5 % — ABNORMAL HIGH (ref 11.5–15.5)
WBC: 13.4 10*3/uL — ABNORMAL HIGH (ref 4.0–10.5)
nRBC: 0.1 % (ref 0.0–0.2)

## 2020-01-23 LAB — COMPREHENSIVE METABOLIC PANEL
ALT: 12 U/L (ref 0–44)
AST: 8 U/L — ABNORMAL LOW (ref 15–41)
Albumin: 2.8 g/dL — ABNORMAL LOW (ref 3.5–5.0)
Alkaline Phosphatase: 89 U/L (ref 38–126)
Anion gap: 20 — ABNORMAL HIGH (ref 5–15)
BUN: 92 mg/dL — ABNORMAL HIGH (ref 8–23)
CO2: 13 mmol/L — ABNORMAL LOW (ref 22–32)
Calcium: 8.6 mg/dL — ABNORMAL LOW (ref 8.9–10.3)
Chloride: 108 mmol/L (ref 98–111)
Creatinine, Ser: 19.34 mg/dL — ABNORMAL HIGH (ref 0.61–1.24)
GFR calc Af Amer: 3 mL/min — ABNORMAL LOW (ref >60)
GFR calc non Af Amer: 2 mL/min — ABNORMAL LOW (ref >60)
Glucose, Bld: 126 mg/dL — ABNORMAL HIGH (ref 70–99)
Potassium: 4.1 mmol/L (ref 3.5–5.1)
Sodium: 141 mmol/L (ref 135–145)
Total Bilirubin: 0.7 mg/dL (ref 0.3–1.2)
Total Protein: 6.6 g/dL (ref 6.5–8.1)

## 2020-01-23 LAB — GLUCOSE, CAPILLARY
Glucose-Capillary: 119 mg/dL — ABNORMAL HIGH (ref 70–99)
Glucose-Capillary: 155 mg/dL — ABNORMAL HIGH (ref 70–99)
Glucose-Capillary: 87 mg/dL (ref 70–99)

## 2020-01-23 LAB — PROCALCITONIN: Procalcitonin: 1.17 ng/mL

## 2020-01-23 LAB — HIV ANTIBODY (ROUTINE TESTING W REFLEX): HIV Screen 4th Generation wRfx: NONREACTIVE

## 2020-01-23 LAB — TSH: TSH: 2.372 u[IU]/mL (ref 0.350–4.500)

## 2020-01-23 LAB — MAGNESIUM: Magnesium: 2.2 mg/dL (ref 1.7–2.4)

## 2020-01-23 MED ORDER — HEPARIN SODIUM (PORCINE) 1000 UNIT/ML IJ SOLN
INTRAMUSCULAR | Status: AC
  Administered 2020-01-23: 1000 [IU]
  Filled 2020-01-23: qty 10

## 2020-01-23 MED ORDER — MIDODRINE HCL 5 MG PO TABS
5.0000 mg | ORAL_TABLET | Freq: Two times a day (BID) | ORAL | Status: DC
  Administered 2020-01-23 – 2020-01-24 (×3): 5 mg via ORAL
  Filled 2020-01-23 (×4): qty 1

## 2020-01-23 MED ORDER — SODIUM CHLORIDE 0.9 % IV SOLN
INTRAVENOUS | Status: DC | PRN
  Administered 2020-01-24: 250 mL via INTRAVENOUS

## 2020-01-23 MED ORDER — CHLORHEXIDINE GLUCONATE CLOTH 2 % EX PADS
6.0000 | MEDICATED_PAD | Freq: Every day | CUTANEOUS | Status: DC
  Administered 2020-01-23 – 2020-01-25 (×2): 6 via TOPICAL

## 2020-01-23 MED ORDER — CHLORHEXIDINE GLUCONATE CLOTH 2 % EX PADS
6.0000 | MEDICATED_PAD | Freq: Every day | CUTANEOUS | Status: DC
  Administered 2020-01-24 – 2020-01-25 (×2): 6 via TOPICAL

## 2020-01-23 MED ORDER — CHLORHEXIDINE GLUCONATE CLOTH 2 % EX PADS: 6.0000 | MEDICATED_PAD | Freq: Every day | CUTANEOUS | Status: DC

## 2020-01-23 NOTE — Consult Note (Signed)
Renal Service Consult Note [facility]  [name] 01/23/2020 [place] Requesting Physician:  Dr Waldron Labs  Reason for Consult:  ESRD pt w/ hypotension , Covid + HPI: The patient is a 64 y.o. year-old yo w/ hx of OSA, obesity, HTN, HL, gout, DM2 and ESRD on HD recently admitted 2/26 - 01/21/20 for what was felt to be RUL bact PNA + COVID pna and had strep bacteremia. Pt was very sick, had TDC removed/ replaced, got IV abx and remdesivir and decadron and was dc'd to complete po abx course on 3/13. Was to get f/u CXR as well to f/u poss RUL mass.  At dc was symptom free.  Pt returned to ED yest w/ dizziness/ lightheaded and was found to be severely hypotensive in the 50's. BP's responded to 1.8 L of IVF's and pt was admitted. Admit bcx's sent yest are negative, no fevers here. Asked to see for HD.   Pt missed yest HD yest (on TTS sched for covid pts).  He is feeling much better today, no CP, sob , cough or fevers.  No abd pain.   ROS  denies CP  no joint pain   no HA  no blurry vision  no rash  no diarrhea  no nausea/ vomiting     Past Medical History  Past Medical History:  Diagnosis Date  . Alcohol abuse   . Congestive heart disease ([place]) 12/2011  . Diabetes mellitus   . GERD (gastroesophageal reflux disease)   . Gout   . Headache   . Hepatitis    being treated for Hepatitis C  . HOH (hard of hearing)   . Hyperlipidemia   . Hyperlipidemia   . Hypertension   . Monoclonal gammopathies 02/25/2012  . Obesity   . Pedal edema   . Pneumonia   . Renal insufficiency   . Sleep apnea    uses cpap  . Tobacco abuse   . Wears hearing aid    b/l   Past Surgical History  Past Surgical History:  Procedure Laterality Date  . AV FISTULA PLACEMENT Right 01/10/2014   Procedure: ARTERIOVENOUS (AV) FISTULA CREATION- RIGHT RADIOCEPHALIC ;  Surgeon: Angelia Mould, MD;  Location: [place];  Service: Vascular;  Laterality: Right;  . COLONOSCOPY    . EYE  SURGERY     cataract surgery, bilateral  . Ganglion cyst removal x 2    . INSERTION OF DIALYSIS CATHETER N/A 01/07/2014   Procedure: INSERTION OF DIALYSIS CATHETER;  Surgeon: Conrad Moline, MD;  Location: [facility] OR;  Service: Vascular;  Laterality: N/A;  . IR REMOVAL TUN CV CATH W/O FL  01/16/2020  . Left knee arthroscopy with generalized joint synovectomy,  01/2006  . LIGATION OF COMPETING BRANCHES OF ARTERIOVENOUS FISTULA Right 05/28/2014   Procedure: LIGATION OF COMPETING BRANCHES OF ARTERIOVENOUS FISTULA;  Surgeon: Angelia Mould, MD;  Location: [place];  Service: Vascular;  Laterality: Right;  . REVISON OF ARTERIOVENOUS FISTULA Right 1/0/2725   Procedure: PLICATION REPAIR OF ARTERIOVENOUS FISTULA PSEUDOANEURYSM;  Surgeon: Serafina Mitchell, MD;  Location: [place];  Service: Vascular;  Laterality: Right;  . REVISON OF ARTERIOVENOUS FISTULA Right 08/08/2018   Procedure: REVISION PLICATION OF ARTERIOVENOUS FISTULA FOREARM;  Surgeon: Angelia Mould, MD;  Location: [place];  Service: Vascular;  Laterality: Right;  . REVISON OF ARTERIOVENOUS FISTULA Right 08/02/2019   Procedure: Artegraft placement right arm arteriovenous graft;  Surgeon: Waynetta Sandy, MD;  Location: [place];  Service:  Vascular;  Laterality: Right;  . SCROTAL EXPLORATION N/A 03/11/2017   Procedure: SCROTUM EXPLORATION AND DEBRIDEMENT;  Surgeon: Franchot Gallo, MD;  Location: [place];  Service: Urology;  Laterality: N/A;  . SCROTAL EXPLORATION N/A 03/21/2017   Procedure: REPEAT SCROTAL DEBRIDEMENT;  Surgeon: Franchot Gallo, MD;  Location: [place];  Service: Urology;  Laterality: N/A;  . SHUNTOGRAM Right 05/14/2014   Procedure: FISTULOGRAM;  Surgeon: Serafina Mitchell, MD;  Location: [facility] CATH LAB;  Service: Cardiovascular;  Laterality: Right;  . THROMBECTOMY W/ EMBOLECTOMY Right 07/04/2017   Procedure: EXPLORATION AND THROMBECTOMY ARTERIOVENOUS FISTULA RIGHT ARM;  Surgeon: Rosetta Posner, MD;  Location: [facility] OR;  Service: Vascular;   Laterality: Right;   Family History  Family History  Problem Relation Age of Onset  . Diabetes Mother   . Heart disease Mother   . Lung cancer Father    Social History  reports that he quit smoking about 9 years ago. His smoking use included cigarettes. He has a 9.90 pack-year smoking history. He has never used smokeless tobacco. He reports that he does not drink alcohol or use drugs. Allergies  Allergies  Allergen Reactions  . Penicillins Nausea And Vomiting and Other (See Comments)    Tolerated Augmentin. Has patient had a PCN reaction causing immediate rash, facial/tongue/throat swelling, SOB or lightheadedness with hypotension: No Has patient had a PCN reaction causing severe rash involving mucus membranes or skin necrosis: No Has patient had a PCN reaction that required hospitalization No Has patient had a PCN reaction occurring within the last 10 years: No If all of the above answers are "NO", then may proceed with Cephalosporin use.  Marland Kitchen Propolis Nausea And Vomiting  . Vioxx [Rofecoxib] Nausea And Vomiting   Home medications Prior to Admission medications   Medication Sig Start Date End Date Taking? Authorizing Provider  acetaminophen (TYLENOL) 325 MG tablet Take 650 mg by mouth every 6 (six) hours as needed for mild pain, moderate pain, fever or headache.    Yes [provider]  allopurinol (ZYLOPRIM) 100 MG tablet Take 1 tablet (100 mg total) by mouth daily. Patient taking differently: Take 100 mg by mouth 2 (two) times daily.  03/14/17  Yes Rai, Ripudeep K, MD  amoxicillin-clavulanate (AUGMENTIN) 500-125 MG tablet Take 1 tablet (500 mg total) by mouth 2 (two) times daily for 5 days. Take through 01/20/2020 01/21/20 01/26/20 Yes Thurnell Lose, MD  AURYXIA 1 GM 210 MG(Fe) tablet Take 420 mg by mouth 2 (two) times daily with a meal.  07/14/18  Yes [provider]  carvedilol (COREG) 3.125 MG tablet Take 1 tablet (3.125 mg total) by mouth 2 (two) times daily. 01/15/20  01/14/21 Yes Thurnell Lose, MD  cinacalcet (SENSIPAR) 90 MG tablet Take 90 mg by mouth every Monday, Wednesday, and Friday.    Yes [provider]  diphenhydrAMINE (BENADRYL) 25 MG tablet Take 25 mg by mouth every Monday, Wednesday, and Friday.   Yes [provider]  furosemide (LASIX) 40 MG tablet Take 40 mg by mouth daily.   Yes [provider]  Insulin Detemir (LEVEMIR FLEXTOUCH) 100 UNIT/ML Pen Inject 20 Units into the skin at bedtime.    Yes [provider]  mirtazapine (REMERON) 15 MG tablet Take 15 mg by mouth at bedtime as needed (sleep).   Yes [provider]  omeprazole (PRILOSEC) 20 MG capsule Take 20 mg by mouth daily.  08/03/19  Yes [provider]  ondansetron (ZOFRAN) 4 MG tablet Take 4  mg by mouth 3 (three) times daily as needed for nausea/vomiting. 01/07/20  Yes [provider]  pravastatin (PRAVACHOL) 20 MG tablet Take 20 mg by mouth daily.    Yes [provider]     Vitals:   01/22/20 2047 01/23/20 0100 01/23/20 0400 01/23/20 0802  BP: 110/63  121/78 (!) 100/58  Pulse:  77 72 77  Resp: 17 19 17 15   Temp: 97.7 F (36.5 C)  97.6 F (36.4 C) 97.9 F (36.6 C)  TempSrc: Oral  Oral Oral  SpO2: 98% 100% 98% 97%  Weight:      Height:       Exam Gen [name] no distress, up in chair No rash, cyanosis or gangrene Sclera anicteric, throat clear  No jvd or bruits Chest clear bilat to bases no rales, wheezing or bronchial BS RRR no MRG  Abd soft ntnd no mass or ascites +bs GU normal male MS no joint effusions or deformity Ext no LE or UE edema, no wounds or ulcers Neuro is alert, Ox 3 , nf    Home meds:  - carvedilol 3.125 bid/ furosemide 40 qd  - pravachol 20  - allopurinol 100 bid/ omeprazole 40  - insulin detemir 20 hs  - mirtazapine 15 hs prn  - cinacalcet 90 mwf/ auryxia 420 bid ac    Outpt HD: (MWF [facility]) > TTS Covid now  5h  550/800  132kg  2/2 bath  P4  AVG  Hep 10K+ 2K midrun  -  last wt here on 3/7 was 125kg    Assessment/ Plan: 1. Hypotension - sig improved w/ fluid boluses and midodrine added. Getting IV abx as well. Blood cx's neg so far x 2.    2. ESRD - missed HD yest, plan HD today and then again tomorrow. 4h.  3. HTN/ volume - BP's low, midodrine started. Euvolemic on exam, no fluid off on HD today.  Is at most recent dc wt (~125kg).  4. IDDM  5. Anemia ckd - Hb 9-10 range, get records 6. MBC ckd - Ca ok, phos not done yet, cont meds 7. COVID infection - tested + first on 01/11/20, 13 days ago    Kelly Splinter  MD 01/23/2020, 1:37 PM    Recent Labs  Lab 01/22/20 1515 01/23/20 0302  WBC 15.6* 13.4*  HGB 10.7* 9.4*   Recent Labs  Lab 01/17/20 0310 01/17/20 0310 01/17/20 1048 01/18/20 0816 01/18/20 0816 01/19/20 0446 01/19/20 0446 01/20/20 0309 01/20/20 0309 01/22/20 1515 01/23/20 0302  K 3.4*   < >  --  4.6   < > 3.8   < > 4.1   < > 4.5 4.1  BUN 59*   < >  --  82*   < > 94*   < > 95*   < > 92* 92*  CREATININE 10.94*   < >  --  14.26*   < > 16.56*   < > 16.37*   < > 18.13* 19.34*  CALCIUM 8.4*  --   --  9.0  --  8.5*  --  8.4*  --  8.4* 8.6*  PHOS  --   --  5.0*  --   --   --   --   --   --   --   --    < > = values in this interval not displayed.

## 2020-01-23 NOTE — Consult Note (Signed)
   [facility] CM Inpatient Consult   01/23/2020  [name] February 02, 1956 [id number]   Patient screened for high risk score for unplanned readmission score of 24% and for a 1 day readmission hospitalizations.  Patient's a Medicare Next Gen ACO recipient [name] and for any potential [facility]  Care Management services are needed.  Review of patient's medical record from history and physical MD notes which includes but not limited to and  reveals patient is:  HPI: [name] is a 64 y.o. male with medical history significant of recent Streptococcus bacteremia on outpatient Augmentin, COVID-19 infection status post routine treatment still under isolation, ESRD hemodialysis, DM2, HTN, HLD, RCC status post nephrectomy was brought to the hospital for evaluation of hypotension and hypothermia.  Patient was discharged yesterday from the hospital after being treated for COVID-19 infection along with Streptococcus bacteremia.  During previous infection his hemodialysis catheter was exchanged.  Chart reviewed for post hospital follow up needs and patient had declined home health and DME per Inpatient Transition of Care [TOC] RNCM notes. Plan: To follow for disposition needs  Primary Care Provider is [name], MD is listed. Will follow up with patient.  Plan:  Follow progress with  inpatient [facility] team assessment and needs for disposition  for post hospital care management needs.    Please place a [facility] Care Management consult as appropriate and for questions contact:   Natividad Brood, RN BSN [facility] Liaison  [phone] business mobile phone Toll free office [phone]  Fax number: [phone] [email or web address]

## 2020-01-23 NOTE — TOC Initial Note (Addendum)
Transition of Care [facility]) - Initial/Assessment Note    Patient Details  Name: [name] MRN: [id number] Date of Birth: [birth year]  Transition of Care [facility]) CM/SW Contact:    Maryclare Labrador, RN Phone Number: 01/23/2020, 10:03 AM  Clinical Narrative:     Pt very recently discharged from [facility] with COVID.  At the time of discharge pt declined HH and DME as ordered.  CM had lengthy discussion with pt regarding readmission.  Pt states he believes his reason for readmission was due to him taking BP meds on am of HD day.  However, pt states he has done this for the last 7 years.  Pt declined needing additional education and all barriers with returning home.   Pt continues to decline HH.  CM discussed the benefits of the COVID at Home program with pt - pt in agreement.  CM requested program consideration from attending.   Pt states he has arranged for transport to and from HD until he feels that he is safe to transport him self.  Pt still has televisit  follow up appt with PCP Dr Benny Lennert for 01/28/20   CM received verbal order for COVID at home - CM sent referral via email      Expected Discharge Plan: [place] Barriers to Discharge: Continued Medical Work up   Patient Goals and CMS Choice Patient states their goals for this hospitalization and ongoing recovery are:: Pt states he wants to be able to do dialysis like he was before without f      Expected Discharge Plan and Services Expected Discharge Plan: [place] Acute Care Choice: (Pt in now in agreement for COVID at home program - CM requested order from attending) Living arrangements for the past 2 months: [place]                                      Prior Living Arrangements/Services Living arrangements for the past 2 months: [place] Lives with:: Spouse Patient language and need for interpreter reviewed:: Yes Do you feel safe going back to the place  where you live?: Yes      Need for Family Participation in Patient Care: Yes (Comment) Care giver support system in place?: Yes (comment)   Criminal Activity/Legal Involvement Pertinent to Current Situation/Hospitalization: No - Comment as needed  Activities of Daily Living Home Assistive Devices/Equipment: Crutches, Other (Comment)(boot) ADL Screening (condition at time of admission) Patient's cognitive ability adequate to safely complete daily activities?: Yes Is the patient deaf or have difficulty hearing?: No Does the patient have difficulty seeing, even when wearing glasses/contacts?: No Does the patient have difficulty concentrating, remembering, or making decisions?: No Patient able to express need for assistance with ADLs?: Yes Does the patient have difficulty dressing or bathing?: No Independently performs ADLs?: Yes (appropriate for developmental age) Does the patient have difficulty walking or climbing stairs?: Yes Weakness of Legs: Left Weakness of Arms/Hands: None  Permission Sought/Granted   Permission granted to share information with : Yes, Verbal Permission Granted     Permission granted to share info w AGENCY: COVID at home        Emotional Assessment   Attitude/Demeanor/Rapport: Gracious, Self-Confident, Engaged Affect (typically observed): Accepting, Adaptable Orientation: : Oriented to Self, Oriented to Place, Oriented to  Time, Oriented to Situation   Psych  Involvement: No (comment)  Admission diagnosis:  Sepsis ([place]) [A41.9] Severe comorbid illness [R69] Sepsis, due to unspecified organism, unspecified whether acute organ dysfunction present [facility]) [A41.9] Patient Active Problem List   Diagnosis Date Noted  . Pressure injury of skin 01/12/2020  . Pneumonia due to COVID-19 virus 01/11/2020  . Hyponatremia 01/11/2020  . Hyperkalemia 01/11/2020  . Anemia 01/11/2020  . Respiratory failure with hypoxia ([place]) 01/11/2020  . BMI 37.0-37.9, adult 01/11/2020   . Effusion of lower leg joint 07/13/2018  . Absolute anemia 07/13/2018  . Benign essential HTN 07/13/2018  . Carpal tunnel syndrome of right wrist 05/19/2017  . Hand pain, right 05/10/2017  . Gram-neg septicemia ([place])   . Scrotal edema   . ESRD (end stage renal disease) on dialysis ([place])   . Scrotal pain   . Sepsis ([place])   . Acute respiratory failure with hypoxia ([place]) 03/04/2017  . Scrotal swelling 03/04/2017  . Chronic hepatitis C without hepatic coma ([place]) 11/17/2016  . Pulmonary nodule 04/02/2014  . Sleep apnea 04/02/2014  . Allergic rhinitis 04/02/2014  . End stage renal disease ([place]) 02/13/2014  . Diabetes mellitus type 2, uncontrolled ([place]) 01/21/2014  . PNA (pneumonia) 01/21/2014  . Pedal edema   . Monoclonal gammopathies 02/25/2012  . Obesity 01/10/2012  . Chronic kidney disease 01/10/2012  . Diabetes mellitus ([place]) 01/10/2012  . Hypertension   . Hyperlipidemia   . Gout   . Alcohol abuse   . Tobacco abuse    PCP:  [name], MD Pharmacy:   [id number] - [place], [place] [address] [place] [address] Phone: [phone] Fax: [phone]     Social Determinants of Health (SDOH) Interventions    Readmission Risk Interventions Readmission Risk Prevention Plan 01/23/2020 01/21/2020  Transportation Screening Complete Complete  PCP or Specialist Appt within 3-5 Days - Complete  HRI or [place] - Patient refused  Palliative Care Screening - Not Applicable  Medication Review (RN Care Manager) - Complete  Some recent data might be hidden

## 2020-01-23 NOTE — Progress Notes (Addendum)
PROGRESS NOTE                                                                                                                                                                                                             Patient Demographics:    [name], is a 64 y.o. male, DOB - 11-Feb-1956, EGB:[id number]  Admit date - 01/22/2020   Admitting Physician Ankit Arsenio Loader, MD  Outpatient Primary MD for the patient is [name], MD  LOS - 1   Chief Complaint  Patient presents with   dialysis/ hypotensive       Brief Narrative     [name] is a 64 y.o. male with medical history significant of recent Streptococcus bacteremia on outpatient Augmentin, COVID-19 infection status post routine treatment still under isolation, ESRD hemodialysis, DM2, HTN, HLD, RCC status post nephrectomy was brought to the hospital for evaluation of hypotension and hypothermia.  Patient was discharged yesterday from the hospital after being treated for COVID-19 infection along with Streptococcus bacteremia.  During previous infection his hemodialysis catheter was exchanged.  After going home he states he was doing well.  He thinks he took his blood pressure medication this morning determined to his dialysis center, 15 minutes into his dialysis he was noted to be hypotensive.  Small fluid bolus was given without much response therefore brought to the hospital.  In the hospital initially his systolic blood pressure was in 50s which responded to IV fluid, his rectal temperature was 96.6.  Cultures were performed and started on Rocephin and Flagyl.  Medical team was requested to admit the patient   Subjective:    [name] today report generalized weakness, denies any chest pain or shortness of breath .    Assessment  & Plan :    Principal Problem:   Sepsis ([place]) Active Problems:   Hypertension   Tobacco abuse   Diabetes mellitus ([place])   End stage renal  disease ([place])   Chronic hepatitis C without hepatic coma (HCC)   ESRD (end stage renal disease) on dialysis (HCC)   Gram-neg septicemia (HCC)   Diabetes mellitus type 2, uncontrolled ([place])  Hypotension -Appears to be multifactorial, given recent hospitalization, as well diarrhea, and patient taking antihypertensive medication, and hemodialysis. -This is unlike related to sepsis, no sepsis this admission. -We will discontinue Coreg. -  Started on midodrine -No fluid pull off on hemodialysis today, as he is almost close to his dry weight.  ESRD -Input greatly appreciated, patient missed his dialysis yesterday given hypotension, so will need dialysis back-to-back, he will be dialyzed today and tomorrow.  Strep bacteremia -Continue with IV Zosyn for now, if cultures remain negative by tomorrow switch back to Augmentin.  Diarrhea -Secondary to Covid, D diff negative  Anemia of chronic disease -Hemoglobin stable  Hyperlipidemia -Statin  Essential hypertension -Antihypertensives on hold for now  History of left foot Charcot joint Diabetes mellitus type 2, uncontrolled secondary to hyperglycemia -Noninfected 1 cm shallow ulcer.  Supportive care. -Previous hemoglobin A1c 10.5.  Insulin sliding scale and Accu-Chek.  Obstructive sleep apnea -Not on CPAP at home  GERD -PPI  COVID-19 Labs  No results for input(s): DDIMER, FERRITIN, LDH, CRP in the last 72 hours.  Lab Results  Component Value Date   [place] (A) 01/11/2020   [place] NEGATIVE 01/08/2020   [place] NEGATIVE 07/31/2019     Code Status : Full  Disposition Plan  : Home with home health  Barriers For Discharge : ESRD, hemodialysis, remains on IV antibiotic, need to rule out infection, and your blood pressure stable before discharge  Consults  :  renal  Procedures  : None  DVT Prophylaxis  :  [place] heparin  Lab Results  Component Value Date   PLT 162 01/23/2020    Antibiotics  :     Anti-infectives (From admission, onward)   Start     Dose/Rate Route Frequency Ordered Stop   01/22/20 2200  piperacillin-tazobactam (ZOSYN) IVPB 2.25 g     2.25 g 100 mL/hr over 30 Minutes Intravenous Every 8 hours 01/22/20 1831     01/22/20 1530  cefTRIAXone (ROCEPHIN) 2 g in sodium chloride 0.9 % 100 mL IVPB     2 g 200 mL/hr over 30 Minutes Intravenous  Once 01/22/20 1527 01/22/20 1630   01/22/20 1530  metroNIDAZOLE (FLAGYL) IVPB 500 mg     500 mg 100 mL/hr over 60 Minutes Intravenous  Once 01/22/20 1527 01/22/20 1709        Objective:   Vitals:   01/23/20 1512 01/23/20 1530 01/23/20 1545 01/23/20 1600  BP: 112/68 126/62 (!) 109/57 (!) 100/49  Pulse: 74 73 73 72  Resp: 20 20 20 19   Temp:      TempSrc:      SpO2:      Weight:      Height:        Wt Readings from Last 3 Encounters:  01/22/20 126 kg  01/21/20 125 kg  01/11/20 128 kg     Intake/Output Summary (Last 24 hours) at 01/23/2020 1607 Last data filed at 01/23/2020 0802 Gross per 24 hour  Intake 2051.22 ml  Output --  Net 2051.22 ml     Physical Exam  Awake Alert, Oriented X 3, No new F.N deficits, Normal affect Symmetrical Chest wall movement, Good air movement bilaterally, CTAB RRR,No Gallops,Rubs or new Murmurs, No Parasternal Heave +ve B.Sounds, Abd Soft, No tenderness,No rebound - guarding or rigidity. No Cyanosis, Clubbing or edema, No new Rash or bruise    Data Review:    CBC Recent Labs  Lab 01/17/20 0310 01/17/20 0310 01/18/20 0816 01/19/20 0446 01/20/20 0309 01/22/20 1515 01/23/20 0302  WBC 23.9*   < > 26.3* 19.3* 12.1* 15.6* 13.4*  HGB 10.6*   < > 11.1* 10.9* 10.5* 10.7* 9.4*  HCT 34.9*   < >  36.0* 37.0* 35.6* 36.8* 32.0*  PLT 257   < > 219 238 203 183 162  MCV 75.5*   < > 76.1* 77.4* 78.2* 80.0 80.0  MCH 22.9*   < > 23.5* 22.8* 23.1* 23.3* 23.5*  MCHC 30.4   < > 30.8 29.5* 29.5* 29.1* 29.4*  RDW 18.1*   < > 18.9* 19.6* 19.4* 20.9* 20.5*  LYMPHSABS 2.3  --  0.8 1.9 2.2 1.7   --   MONOABS 1.8*  --  1.6* 0.6 0.8 1.1*  --   EOSABS 0.1  --  0.0 0.2 0.5 0.0  --   BASOSABS 0.0  --  0.0 0.0 0.1 0.0  --    < > = values in this interval not displayed.    Chemistries  Recent Labs  Lab 01/17/20 0310 01/17/20 0310 01/18/20 0816 01/19/20 0446 01/20/20 0309 01/22/20 1515 01/23/20 0302  NA 135   < > 136 138 138 140 141  K 3.4*   < > 4.6 3.8 4.1 4.5 4.1  CL 99   < > 100 100 102 106 108  CO2 17*   < > 15* 18* 17* 13* 13*  GLUCOSE 262*   < > 209* 149* 142* 164* 126*  BUN 59*   < > 82* 94* 95* 92* 92*  CREATININE 10.94*   < > 14.26* 16.56* 16.37* 18.13* 19.34*  CALCIUM 8.4*   < > 9.0 8.5* 8.4* 8.4* 8.6*  MG 2.0  --  2.1 2.3 2.2  --  2.2  AST 13*   < > 13* 11* 10* 12* 8*  ALT 13   < > 11 13 12 13 12   ALKPHOS 98   < > 78 78 90 97 89  BILITOT 0.5   < > 0.4 0.5 0.6 0.7 0.7   < > = values in this interval not displayed.   ------------------------------------------------------------------------------------------------------------------ No results for input(s): CHOL, HDL, LDLCALC, TRIG, CHOLHDL, LDLDIRECT in the last 72 hours.  Lab Results  Component Value Date   HGBA1C 10.5 (H) 08/08/2018   ------------------------------------------------------------------------------------------------------------------ Recent Labs    01/23/20 0814  TSH 2.372   ------------------------------------------------------------------------------------------------------------------ No results for input(s): VITAMINB12, FOLATE, FERRITIN, TIBC, IRON, RETICCTPCT in the last 72 hours.  Coagulation profile Recent Labs  Lab 01/18/20 0816 01/22/20 1515  INR 1.2 1.2    No results for input(s): DDIMER in the last 72 hours.  Cardiac Enzymes No results for input(s): CKMB, TROPONINI, MYOGLOBIN in the last 168 hours.  Invalid input(s): CK ------------------------------------------------------------------------------------------------------------------    Component Value Date/Time    BNP 28.8 01/20/2020 0309    Inpatient Medications  Scheduled Meds:  Chlorhexidine Gluconate Cloth  6 each Topical Daily   [START ON 01/24/2020] Chlorhexidine Gluconate Cloth  6 each Topical Q0600   cinacalcet  90 mg Oral Q M,W,F   ferric citrate  420 mg Oral BID WC   heparin       heparin  5,000 Units Subcutaneous Q8H   insulin aspart  0-5 Units Subcutaneous QHS   insulin aspart  0-9 Units Subcutaneous TID WC   insulin aspart  2 Units Subcutaneous TID WC   insulin detemir  20 Units Subcutaneous QHS   midodrine  5 mg Oral BID WC   pantoprazole  40 mg Oral Daily   pravastatin  20 mg Oral Daily   Continuous Infusions:  piperacillin-tazobactam (ZOSYN)  IV 2.25 g (01/23/20 0530)   PRN Meds:.acetaminophen **OR** acetaminophen, mirtazapine, polyethylene glycol, senna-docusate  Micro Results Recent Results (from the  past 240 hour(s))  Culture, blood (routine x 2)     Status: None   Collection Time: 01/14/20 12:10 PM   Specimen: BLOOD  Result Value Ref Range Status   Specimen Description BLOOD LEFT ANTECUBITAL  Final   Special Requests   Final    BOTTLES DRAWN AEROBIC ONLY Blood Culture results may not be optimal due to an inadequate volume of blood received in culture bottles   Culture   Final    NO GROWTH 5 DAYS Performed at [facility], [place] [address], [place], [place] [address]    Report Status 01/19/2020 FINAL  Final  Culture, blood (routine x 2)     Status: None   Collection Time: 01/14/20 12:13 PM   Specimen: BLOOD LEFT HAND  Result Value Ref Range Status   Specimen Description BLOOD LEFT HAND  Final   Special Requests   Final    BOTTLES DRAWN AEROBIC ONLY Blood Culture results may not be optimal due to an inadequate volume of blood received in culture bottles   Culture   Final    NO GROWTH 5 DAYS Performed at [facility], [place] [address] [place], [place] [address]    Report Status 01/19/2020 FINAL  Final  Blood Culture (routine x 2)      Status: None (Preliminary result)   Collection Time: 01/22/20  3:15 PM   Specimen: BLOOD  Result Value Ref Range Status   Specimen Description BLOOD LEFT ANTECUBITAL  Final   Special Requests   Final    BOTTLES DRAWN AEROBIC AND ANAEROBIC Blood Culture results may not be optimal due to an inadequate volume of blood received in culture bottles   Culture   Final    NO GROWTH < 24 HOURS Performed at [facility], [place] [address] [place], [place] [address]    Report Status PENDING  Incomplete  Blood Culture (routine x 2)     Status: None (Preliminary result)   Collection Time: 01/22/20  3:40 PM   Specimen: BLOOD  Result Value Ref Range Status   Specimen Description BLOOD RIGHT ANTECUBITAL  Final   Special Requests   Final    BOTTLES DRAWN AEROBIC AND ANAEROBIC Blood Culture results may not be optimal due to an inadequate volume of blood received in culture bottles   Culture   Final    NO GROWTH < 24 HOURS Performed at [facility], [place] [address] [place], [place] [address]    Report Status PENDING  Incomplete  C Difficile Quick Screen w PCR reflex     Status: None   Collection Time: 01/22/20  5:03 PM   Specimen: Stool  Result Value Ref Range Status   C Diff antigen NEGATIVE NEGATIVE Final   C Diff toxin NEGATIVE NEGATIVE Final   C Diff interpretation No C. difficile detected.  Final    Comment: Performed at [facility], [place] [address], [place], [place] [address]    Radiology Reports CT ABDOMEN PELVIS WO CONTRAST  Result Date: 01/13/2020 CLINICAL DATA:  Follow-up of COVID pneumonia. Lung nodule on chest radiograph. Diffuse abdominal pain. EXAM: CT CHEST, ABDOMEN AND PELVIS WITHOUT CONTRAST TECHNIQUE: Multidetector CT imaging of the chest, abdomen and pelvis was performed following the standard protocol without IV contrast. COMPARISON:  Chest radiograph 01/12/2020. CT of the abdomen and pelvis of 12/05/2018. CTA of the chest of 03/04/2017. FINDINGS: CT CHEST  FINDINGS Cardiovascular: A left sided central line terminates in the right  atrium. Aortic atherosclerosis. Tortuous thoracic aorta. Moderate cardiomegaly, without pericardial effusion. Multivessel coronary artery atherosclerosis. Pulmonary artery enlargement, outflow tract 3.6 cm. 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 Mediastinum/Nodes: Right paratracheal node of 1.1 cm Hilar regions poorly evaluated without intravenous contrast. Lungs/Pleura: No pleural fluid.  Mild centrilobular emphysema. Right greater than left peripheral upper lobe airspace disease with mild surrounding ground-glass. Musculoskeletal: No acute osseous abnormality. CT ABDOMEN PELVIS FINDINGS Hepatobiliary: Hepatomegaly at 19.2 cm craniocaudal. Hepatic morphology, including caudate lobe enlargement and irregular hepatic capsule, highly suspicious for mild cirrhosis. Normal gallbladder, without biliary ductal dilatation. Pancreas: Normal, without mass or ductal dilatation. Spleen: Normal in size, without focal abnormality. Adrenals/Urinary Tract: Mild bilateral adrenal thickening. Native renal atrophy on the left. Right nephrectomy. Multiple low-density left renal lesions are likely cysts. No bladder calculi. Stomach/Bowel: Normal stomach, without wall thickening. Scattered colonic diverticula. Wall thickening within the sigmoid with mild surrounding edema, including on 95/3. No pericolonic fluid collection or free perforation. Separate area of pericolonic edema posterior to the ascending colon including on 84/3. Vascular/Lymphatic: Aortic and branch vessel atherosclerosis. Reproductive: No abdominopelvic adenopathy.  Normal prostate. Other: Fat containing left inguinal hernia. No significant free fluid. Tiny periumbilical fat containing ventral abdominal wall laxity. Musculoskeletal: Renal osteodystrophy.  Lumbosacral spondylosis. IMPRESSION: CT CHEST IMPRESSION 1. Right greater than left posterior upper lobe  airspace and ground-glass opacity. Most consistent with pneumonia. This is not atypical appearance of COVID-19 pneumonia, which cannot be excluded. Other infectious etiologies should also be considered. 2. Aortic atherosclerosis (ICD10-I70.0), coronary artery atherosclerosis and emphysema (ICD10-J43.9). 3. Mild thoracic adenopathy, likely reactive. 4. Pulmonary artery enlargement suggests pulmonary arterial hypertension. CT ABDOMEN AND PELVIS IMPRESSION 1. Sigmoid wall thickening and pericolonic edema, likely related to non complicated diverticulitis. Separate edema posterior to the ascending colon is nonspecific and could represent an area of colitis or mild diverticulitis. 2. Hepatomegaly. Hepatic morphology, highly suspicious for cirrhosis. Electronically Signed   By: Abigail Miyamoto M.D.   On: 01/13/2020 12:36   CT CHEST WO CONTRAST  Result Date: 01/13/2020 CLINICAL DATA:  Follow-up of COVID pneumonia. Lung nodule on chest radiograph. Diffuse abdominal pain. EXAM: CT CHEST, ABDOMEN AND PELVIS WITHOUT CONTRAST TECHNIQUE: Multidetector CT imaging of the chest, abdomen and pelvis was performed following the standard protocol without IV contrast. COMPARISON:  Chest radiograph 01/12/2020. CT of the abdomen and pelvis of 12/05/2018. CTA of the chest of 03/04/2017. FINDINGS: CT CHEST FINDINGS Cardiovascular: A left sided central line terminates in the right atrium. Aortic atherosclerosis. Tortuous thoracic aorta. Moderate cardiomegaly, without pericardial effusion. Multivessel coronary artery atherosclerosis. Pulmonary artery enlargement, outflow tract 3.6 cm. 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 Mediastinum/Nodes: Right paratracheal node of 1.1 cm Hilar regions poorly evaluated without intravenous contrast. Lungs/Pleura: No pleural fluid.  Mild centrilobular emphysema. Right greater than left peripheral upper lobe airspace disease with mild surrounding ground-glass.  Musculoskeletal: No acute osseous abnormality. CT ABDOMEN PELVIS FINDINGS Hepatobiliary: Hepatomegaly at 19.2 cm craniocaudal. Hepatic morphology, including caudate lobe enlargement and irregular hepatic capsule, highly suspicious for mild cirrhosis. Normal gallbladder, without biliary ductal dilatation. Pancreas: Normal, without mass or ductal dilatation. Spleen: Normal in size, without focal abnormality. Adrenals/Urinary Tract: Mild bilateral adrenal thickening. Native renal atrophy on the left. Right nephrectomy. Multiple low-density left renal lesions are likely cysts. No bladder calculi. Stomach/Bowel: Normal stomach, without wall thickening. Scattered colonic diverticula. Wall thickening within the sigmoid with mild surrounding edema, including on 95/3. No pericolonic fluid collection or free perforation. Separate area of  pericolonic edema posterior to the ascending colon including on 84/3. Vascular/Lymphatic: Aortic and branch vessel atherosclerosis. Reproductive: No abdominopelvic adenopathy.  Normal prostate. Other: Fat containing left inguinal hernia. No significant free fluid. Tiny periumbilical fat containing ventral abdominal wall laxity. Musculoskeletal: Renal osteodystrophy.  Lumbosacral spondylosis. IMPRESSION: CT CHEST IMPRESSION 1. Right greater than left posterior upper lobe airspace and ground-glass opacity. Most consistent with pneumonia. This is not atypical appearance of COVID-19 pneumonia, which cannot be excluded. Other infectious etiologies should also be considered. 2. Aortic atherosclerosis (ICD10-I70.0), coronary artery atherosclerosis and emphysema (ICD10-J43.9). 3. Mild thoracic adenopathy, likely reactive. 4. Pulmonary artery enlargement suggests pulmonary arterial hypertension. CT ABDOMEN AND PELVIS IMPRESSION 1. Sigmoid wall thickening and pericolonic edema, likely related to non complicated diverticulitis. Separate edema posterior to the ascending colon is nonspecific and could  represent an area of colitis or mild diverticulitis. 2. Hepatomegaly. Hepatic morphology, highly suspicious for cirrhosis. Electronically Signed   By: Abigail Miyamoto M.D.   On: 01/13/2020 12:36   IR Removal Tun Cv Cath W/O FL  Result Date: 01/16/2020 INDICATION: Streptococcus pneumoniae bacteremia. Request for removal of tunneled hemodialysis catheter. Initially placed at an outside facility. EXAM: REMOVAL OF TUNNELED HEMODIALYSIS CATHETER MEDICATIONS: 1% lidocaine with epinephrine 4 mL COMPLICATIONS: None immediate. PROCEDURE: Informed written consent was obtained from the patient following an explanation of the procedure, risks, benefits and alternatives to treatment. A time out was performed prior to the initiation of the procedure. Maximal barrier sterile technique was utilized including caps, mask, sterile gowns, sterile gloves, large sterile drape, hand hygiene, and Betadine. 1% lidocaine with epinephrine was injected under sterile conditions along the subcutaneous tunnel. Utilizing a combination of blunt dissection and gentle traction, the catheter was removed intact. Hemostasis was obtained with manual compression. A dressing was placed. The patient tolerated the procedure well without immediate post procedural complication. IMPRESSION: Successful removal of tunneled dialysis catheter. Read by: Gareth Eagle, PA-C Electronically Signed   By: Jacqulynn Cadet M.D.   On: 01/16/2020 14:54   DG Chest Port 1 View  Result Date: 01/22/2020 CLINICAL DATA:  Hypotension. EXAM: PORTABLE CHEST 1 VIEW COMPARISON:  Radiograph 01/14/2020, CT 01/13/2020 FINDINGS: Left dialysis catheter with tip at the atrial caval junction. Cardiomegaly is similar with unchanged mediastinal contours. Patchy right upper lobe opacities which are similar to recent prior exam. No new airspace disease. No pulmonary edema, large pleural effusion or pneumothorax. No acute osseous abnormalities are seen. IMPRESSION: Patchy right upper lobe  opacities, similar to recent prior exam. Stable cardiomegaly. No acute findings. Electronically Signed   By: Keith Rake M.D.   On: 01/22/2020 15:36   DG Chest Port 1 View  Result Date: 01/14/2020 CLINICAL DATA:  Shortness of breath, COVID positive EXAM: PORTABLE CHEST 1 VIEW COMPARISON:  01/12/2020 FINDINGS: Left dialysis catheter remains in place, unchanged. Patchy airspace disease in the right upper lobe again noted. Overall, airspace disease throughout the right lung has improved. No confluent opacity on the left. No effusions. No acute bony abnormality. Heart is borderline in size. IMPRESSION: Improving airspace disease with mild residual patchy right upper lobe opacities. Electronically Signed   By: Rolm Baptise M.D.   On: 01/14/2020 11:33   DG Chest Port 1 View  Result Date: 01/12/2020 CLINICAL DATA:  Short of breath EXAM: PORTABLE CHEST 1 VIEW COMPARISON:  01/11/2020 FINDINGS: Right upper lobe airspace disease unchanged. Improved aeration in the lung bases with improved lung volume. Negative for edema or effusion. Central venous catheter tip in the right atrium  unchanged. IMPRESSION: Persistent right upper lobe airspace disease unchanged Improved aeration lungs with decrease in bibasilar atelectasis. Electronically Signed   By: Franchot Gallo M.D.   On: 01/12/2020 09:16   DG Chest Port 1 View  Result Date: 01/11/2020 CLINICAL DATA:  Shortness of breath EXAM: PORTABLE CHEST 1 VIEW COMPARISON:  01/10/2019 FINDINGS: There is bilateral diffuse interstitial thickening. There is a nodular appearance along the right suprahilar region likely reflecting prominent pulmonary vasculature or airspace disease. There is no pleural effusion or pneumothorax. The heart mediastinum are stable. There is a large bore left-sided central venous catheter with the tip projecting over the cavoatrial junction. There is no acute osseous abnormality. IMPRESSION: Diffuse bilateral interstitial thickening likely reflecting  interstitial edema. Nodular appearance along the right suprahilar region likely reflecting prominent pulmonary vasculature or airspace disease. Electronically Signed   By: Kathreen Devoid   On: 01/11/2020 16:31   DG Chest Port 1 View  Result Date: 01/11/2020 CLINICAL DATA:  Cough, short of breath EXAM: PORTABLE CHEST 1 VIEW COMPARISON:  03/07/2017 FINDINGS: Single frontal view of the chest demonstrates a left internal jugular dialysis catheter tip overlying atrial caval junction. Vascular stent overlies the right axilla. Cardiac silhouette is stable. There is a 7.3 cm mass projecting over the right anterior first and second ribs. CT chest with IV contrast recommended for suspected neoplasm. There is central vascular congestion with diffuse interstitial prominence consistent with mild fluid overload. Right hilar prominence could also reflect lymphadenopathy. No effusion or pneumothorax. No acute bony abnormality. IMPRESSION: 1. 7.3 cm right upper lobe mass consistent with neoplasm. CT chest with IV contrast is recommended. 2. Right hilar prominence could reflect underlying adenopathy. 3. Vascular congestion and interstitial edema. Electronically Signed   By: Randa Ngo M.D.   On: 01/11/2020 04:02   VAS [place] UPPER EXTREMITY ARTERIAL DUPLEX  Result Date: 12/25/2019 UPPER EXTREMITY DUPLEX STUDY Indications: Pre-operative exam.  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 Bilateral testing is considered an integral part of a complete examination. Limited examinations for reoccurring indications may be performed as noted.  Right Pre-Dialysis Findings:  +-----------------------+----------+--------------------+---------+--------+  Location                PSV (cm/s) Intralum. Diam. (cm) Waveform  Comments  +-----------------------+----------+--------------------+---------+--------+  Brachial Antecub. fossa 40         0.46                 triphasic           +-----------------------+----------+--------------------+---------+--------+  Radial Art at Wrist     35         0.29                 triphasic           +-----------------------+----------+--------------------+---------+--------+  Ulnar Art at Wrist      81         0.27                 triphasic           +-----------------------+----------+--------------------+---------+--------+ +------------+-----------------+  Allen's Test Branches at fossa  +------------+-----------------+ Left Pre-Dialysis Findings: +-----------------------+----------+--------------------+---------+--------+  Location  PSV (cm/s) Intralum. Diam. (cm) Waveform  Comments  +-----------------------+----------+--------------------+---------+--------+  Brachial Antecub. fossa 54         0.52                 triphasic           +-----------------------+----------+--------------------+---------+--------+  Radial Art at Wrist     44         0.27                 triphasic tortuous  +-----------------------+----------+--------------------+---------+--------+  Ulnar Art at Wrist      73         0.19                 triphasic           +-----------------------+----------+--------------------+---------+--------+ +------------+-----------------+  [facility] Test Branches at fossa  +------------+-----------------+  Summary:   Measurements above. *See table(s) above for measurements and observations. Electronically signed by Curt Jews MD on 12/25/2019 at 9:30:15 AM.    Final    VAS [place] UPPER EXTREMITY VEIN MAPPING  Result Date: 12/25/2019 UPPER EXTREMITY VEIN MAPPING  Indications: Pre-access. History: History of right Radiocephalic fistula years  ago.  Performing Technologist: Alvia Grove RVT  Examination Guidelines: A complete evaluation includes B-mode imaging, spectral Doppler, color Doppler, and power Doppler as needed of all accessible portions of each vessel. Bilateral testing is considered an integral part of a complete examination. Limited examinations for reoccurring indications may be performed as noted. +-----------------+-------------+----------+--------+  Right Cephalic    Diameter (cm) Depth (cm) Findings  +-----------------+-------------+----------+--------+  Prox upper arm                                [place]     +-----------------+-------------+----------+--------+  Mid upper arm                                 [place]     +-----------------+-------------+----------+--------+  Dist upper arm                                [place]     +-----------------+-------------+----------+--------+  Antecubital fossa                             [place]     +-----------------+-------------+----------+--------+ +-----------------+-------------+----------+---------+  Right Basilic     Diameter (cm) Depth (cm) Findings   +-----------------+-------------+----------+---------+  Prox upper arm        0.46                            +-----------------+-------------+----------+---------+  Mid upper arm         0.68                 branching  +-----------------+-------------+----------+---------+  Dist upper arm        0.34                            +-----------------+-------------+----------+---------+  Antecubital fossa     0.31                            +-----------------+-------------+----------+---------+  Prox forearm  0.27                            +-----------------+-------------+----------+---------+ +-----------------+-------------+----------+--------+  Left Cephalic     Diameter (cm) Depth (cm) Findings  +-----------------+-------------+----------+--------+  Shoulder              0.47                            +-----------------+-------------+----------+--------+  Prox upper arm        0.36                           +-----------------+-------------+----------+--------+  Mid upper arm         0.41                           +-----------------+-------------+----------+--------+  Dist upper arm        0.43                           +-----------------+-------------+----------+--------+  Antecubital fossa     0.54                           +-----------------+-------------+----------+--------+  Prox forearm          0.43                           +-----------------+-------------+----------+--------+  Mid forearm           0.38                           +-----------------+-------------+----------+--------+  Dist forearm          0.33                           +-----------------+-------------+----------+--------+ +-----------------+-------------+----------+--------+  Left Basilic      Diameter (cm) Depth (cm) Findings  +-----------------+-------------+----------+--------+  Prox upper arm        0.48                           +-----------------+-------------+----------+--------+  Mid upper arm         0.51                           +-----------------+-------------+----------+--------+  Dist upper arm        0.47                           +-----------------+-------------+----------+--------+  Antecubital fossa     0.26                           +-----------------+-------------+----------+--------+  Prox forearm          0.25                           +-----------------+-------------+----------+--------+ Summary: Right: Occluded right forearm fistula. Thrombus in the cephalic vein        from the antecubital fossa extending proximally into the  subclavian vein, appearing chronic. Left: Measurements above. *See table(s) above for measurements and observations.  Diagnosing physician: Curt Jews MD Electronically signed by Curt Jews MD on 12/25/2019 at 9:27:41 AM.    Final       Phillips Climes M.D on 01/23/2020 at 4:07  PM  Between 7am to 7pm - Pager - [phone]  After 7pm go to [email or web address] - password [facility]  [facility] -  Office  [phone]

## 2020-01-24 LAB — CBC
HCT: 28 % — ABNORMAL LOW (ref 39.0–52.0)
Hemoglobin: 8.8 g/dL — ABNORMAL LOW (ref 13.0–17.0)
MCH: 24.1 pg — ABNORMAL LOW (ref 26.0–34.0)
MCHC: 31.4 g/dL (ref 30.0–36.0)
MCV: 76.7 fL — ABNORMAL LOW (ref 80.0–100.0)
Platelets: 148 10*3/uL — ABNORMAL LOW (ref 150–400)
RBC: 3.65 MIL/uL — ABNORMAL LOW (ref 4.22–5.81)
RDW: 20.4 % — ABNORMAL HIGH (ref 11.5–15.5)
WBC: 12.2 10*3/uL — ABNORMAL HIGH (ref 4.0–10.5)
nRBC: 0 % (ref 0.0–0.2)

## 2020-01-24 LAB — GLUCOSE, CAPILLARY
Glucose-Capillary: 90 mg/dL (ref 70–99)
Glucose-Capillary: 135 mg/dL — ABNORMAL HIGH (ref 70–99)
Glucose-Capillary: 89 mg/dL (ref 70–99)
Glucose-Capillary: 112 mg/dL — ABNORMAL HIGH (ref 70–99)

## 2020-01-24 LAB — MAGNESIUM: Magnesium: 1.8 mg/dL (ref 1.7–2.4)

## 2020-01-24 MED ORDER — SODIUM CHLORIDE 0.9 % IV SOLN
INTRAVENOUS | Status: AC
  Administered 2020-01-24: 1000 mL via INTRAVENOUS

## 2020-01-24 MED ORDER — BLISTEX MEDICATED EX OINT: TOPICAL_OINTMENT | CUTANEOUS | Status: DC | PRN

## 2020-01-24 MED ORDER — LACTATED RINGERS IV BOLUS
250.0000 mL | Freq: Once | INTRAVENOUS | Status: AC
  Administered 2020-01-24: 250 mL via INTRAVENOUS

## 2020-01-24 MED ORDER — LIP MEDEX EX OINT
TOPICAL_OINTMENT | CUTANEOUS | Status: DC | PRN
  Filled 2020-01-24: qty 7

## 2020-01-24 MED ORDER — AMOXICILLIN-POT CLAVULANATE 500-125 MG PO TABS
1.0000 | ORAL_TABLET | ORAL | Status: DC
  Administered 2020-01-24: 500 mg via ORAL
  Filled 2020-01-24: qty 1

## 2020-01-24 MED ORDER — DARBEPOETIN ALFA 100 MCG/0.5ML IJ SOSY
100.0000 ug | PREFILLED_SYRINGE | INTRAMUSCULAR | Status: DC
  Administered 2020-01-24: 100 ug via SUBCUTANEOUS
  Filled 2020-01-24: qty 0.5

## 2020-01-24 NOTE — Progress Notes (Signed)
Writer offered to call a family member with update, but patient refused. Patient alert and oriented x 4, he said he already talked with his family

## 2020-01-24 NOTE — Progress Notes (Signed)
Pt refused  to do hemodialysis tonight and Pt  wants to do tomorrow. Report confirmed with L.Designer, fashion/clothing

## 2020-01-24 NOTE — Evaluation (Addendum)
Physical Therapy Evaluation Patient Details Name: [name] MRN: [id number] DOB: July 16, 1956 Today's Date: 01/24/2020   History of Present Illness  64 year old male readmitted after recent discharge from hospital one day prior to this admission. He was re-admitted 01/22/20 for hypotension and hypothermia. Patient took his blood pressure medication prior to going to dialysis and noted to be hypotensive in dialysis. Small fluid bolus was given without much effect so patient brought to hospital. Prior admission for COVID-19 infection and Streptococcus bacteremia. PMH: ESRD on hemodialysis, DM2, HTN, HLD, RCC status post nephrectomy     Clinical Impression  Patient grossly close supervision for mobility in room using RW. He requires assist for donning L boot for Charcot foot but at baseline his wife assists him. Patient does report a slip at home after last admission. Reports MDs are aware. Patient did decline RW when offered by PT/CM at discharge last admission. Recommend continued skilled PT services and home PT along with use of RW for all mobility.    Follow Up Recommendations Home health PT;Supervision - Intermittent    Equipment Recommendations  Rolling walker with 5" wheels       Precautions / Restrictions Precautions Precautions: Fall;Other (comment) Precaution Comments: L charcot foot deformity w/ boot pt wears at baseline (currently top strap is broken but able to be used by wrapping it around and securing) Restrictions Weight Bearing Restrictions: No      Mobility  Bed Mobility   General bed mobility comments: Patient already OOB in chair upon PT arrival.  Transfers Overall transfer level: Needs assistance Equipment used: Rolling walker (2 wheeled) Transfers: Sit to/from Stand Sit to Stand: Supervision;Min guard    Ambulation/Gait Ambulation/Gait assistance: Supervision Gait Distance (Feet): 30 Feet Assistive device: Rolling walker (2 wheeled) Gait  Pattern/deviations: Decreased stance time - right;Antalgic Gait velocity: Decreased   General Gait Details: Assisted patient with donning L boot prior to ambulation.  Stairs Not assessed this session       Balance Overall balance assessment: Needs assistance         Standing balance support: Bilateral upper extremity supported   Standing balance comment: Supervision standing balance with RW      Pertinent Vitals/Pain Pain Assessment: Faces Faces Pain Scale: Hurts little more Pain Location: L knee Pain Descriptors / Indicators: Aching Pain Intervention(s): Limited activity within patient's tolerance;Monitored during session;Heat applied(nurse cleared patient to have heat pack at end of session)    Home Living Family/patient expects to be discharged to:: Private residence Living Arrangements: Spouse/significant other Available Help at Discharge: Family;Available PRN/intermittently Type of Home: House Home Access: Stairs to enter Entrance Stairs-Rails: Left Entrance Stairs-Number of Steps: 5-6 Home Layout: One level Home Equipment: Crutches Additional Comments: Patient declined RW at discharge of last admission. His wife is there to assist him as needed but not there all the time (more sets him up with lunch etc). She assists him with donning L boot.    Prior Function Level of Independence: Needs assistance   Gait / Transfers Assistance Needed: Independent with ambulation with L boot for Charcot foot deformity. RW was recommended at time of discharge last admission but patient declined. He reports he fell at home during transfer. He reports no issues negotiating the stairs to enter his home at time of discharge last admission.  ADL's / Homemaking Assistance Needed: His wife assists as needed.  Comments: There are times patient is home alone so needs to be able to transfer and perform mobility/toileting on his own.  Extremity/Trunk Assessment        Lower  Extremity Assessment Lower Extremity Assessment: (BLE strength grossly 4+/5 except for hip flexion 4-/5)       Communication   Communication: No difficulties  Cognition Arousal/Alertness: Awake/alert Behavior During Therapy: WFL for tasks assessed/performed Overall Cognitive Status: Within Functional Limits for tasks assessed      General Comments General comments (skin integrity, edema, etc.): BP improved in standing (seated SBP 90s, standing SBP 110s), unable to get SpO2 reading        Assessment/Plan    PT Assessment Patient needs continued PT services  PT Problem List Decreased strength;Decreased activity tolerance;Decreased balance;Decreased mobility;Decreased knowledge of use of DME;Decreased safety awareness;Pain;Cardiopulmonary status limiting activity       PT Treatment Interventions DME instruction;Gait training;Stair training;Functional mobility training;Therapeutic activities;Therapeutic exercise;Balance training;Patient/family education    PT Goals (Current goals can be found in the Care Plan section)  Acute Rehab PT Goals Time For Goal Achievement: 02/06/20 Potential to Achieve Goals: Good    Frequency Min 3X/week           AM-PAC PT "6 Clicks" Mobility  Outcome Measure Help needed turning from your back to your side while in a flat bed without using bedrails?: None Help needed moving from lying on your back to sitting on the side of a flat bed without using bedrails?: None Help needed moving to and from a bed to a chair (including a wheelchair)?: A Little Help needed standing up from a chair using your arms (e.g., wheelchair or bedside chair)?: A Little Help needed to walk in hospital room?: A Little Help needed climbing 3-5 steps with a railing? : A Lot 6 Click Score: 19    End of Session   Activity Tolerance: Patient tolerated treatment well Patient left: in chair;with call bell/phone within reach;with chair alarm set Nurse Communication: Mobility  status(heat for L knee per patient request) PT Visit Diagnosis: Unsteadiness on feet (R26.81);Other abnormalities of gait and mobility (R26.89)    Time: 3335-4562 PT Time Calculation (min) (ACUTE ONLY): 34 min  Initiated evaluation earlier and patient declined, wanting to eat lunch first, PT returned after lunch for evaluation.   Charges:   PT Evaluation $PT Eval Moderate Complexity: 1 Mod          [name], DPT, PT Acute Rehab [phone] office    [name] 01/24/2020, 3:50 PM

## 2020-01-24 NOTE — Progress Notes (Signed)
PROGRESS NOTE                                                                                                                                                                                                             Patient Demographics:    [name], is a 64 y.o. male, DOB - 05/31/1956, GOT:[id number]  Admit date - 01/22/2020   Admitting Physician Ankit Arsenio Loader, MD  Outpatient Primary MD for the patient is [name], MD  LOS - 2   Chief Complaint  Patient presents with  . dialysis/ hypotensive       Brief Narrative     [name] is a 64 y.o. male with medical history significant of recent Streptococcus bacteremia on outpatient Augmentin, COVID-19 infection status post routine treatment still under isolation, ESRD hemodialysis, DM2, HTN, HLD, RCC status post nephrectomy was brought to the hospital for evaluation of hypotension and hypothermia.  Patient was discharged yesterday from the hospital after being treated for COVID-19 infection along with Streptococcus bacteremia.  During previous infection his hemodialysis catheter was exchanged.  After going home he states he was doing well.  He thinks he took his blood pressure medication this morning determined to his dialysis center, 15 minutes into his dialysis he was noted to be hypotensive.  Small fluid bolus was given without much response therefore brought to the hospital.  In the hospital initially his systolic blood pressure was in 50s which responded to IV fluid, his rectal temperature was 96.6.  Cultures were performed and started on Rocephin and Flagyl.  Medical team was requested to admit the patient   Subjective:    [name] today ports he is feeling better, denies any dizziness or lightheadedness.   Assessment  & Plan :    Principal Problem:   Sepsis ([place]) Active Problems:   Hypertension   Tobacco abuse   Diabetes mellitus ([place])   End stage renal disease  ([place])   Chronic hepatitis C without hepatic coma (HCC)   ESRD (end stage renal disease) on dialysis ([place])   Gram-neg septicemia (HCC)   Diabetes mellitus type 2, uncontrolled ([place])  Hypotension -Appears to be multifactorial, given recent hospitalization, as well diarrhea, and patient taking antihypertensive medication, and hemodialysis, as well he appears to be on the dry side, where he is under 2 kg under  his dry weight. -This is unlike related to sepsis, no sepsis this admission. -Coreg has been stopped -Started on midodrine -No fluid pull off on hemodialysis today, renal will run some IV fluids overnight, he will be dialyzed again today with no fluid removed/  ESRD -Input greatly appreciated, patient missed his dialysis yesterday given hypotension, so will need dialysis back-to-back, he was dialyzed yesterday, will be dialyzed again today  Strep bacteremia -Repeat blood cultures remains negative, will change IV Zosyn back to Augmentin .  Diarrhea -Secondary to Covid, D diff negative  Anemia of chronic disease -Hemoglobin stable  Hyperlipidemia -Statin  Essential hypertension -Antihypertensives on hold for now  History of left foot Charcot joint Diabetes mellitus type 2, uncontrolled secondary to hyperglycemia -Noninfected 1 cm shallow ulcer.  Supportive care. -Previous hemoglobin A1c 10.5.  Insulin sliding scale and Accu-Chek.  Obstructive sleep apnea -Not on CPAP at home  GERD -PPI  COVID-19 Labs  No results for input(s): DDIMER, FERRITIN, LDH, CRP in the last 72 hours.  Lab Results  Component Value Date   [place] (A) 01/11/2020   [place] NEGATIVE 01/08/2020   [place] NEGATIVE 07/31/2019     Code Status : Full  Family communication:   Disposition Plan  : Home with home health  Barriers For Discharge : ESRD, hemodialysis, patient remains with low blood pressure.  Consults  :  renal  Procedures  : None  DVT Prophylaxis  :   [place] heparin  Lab Results  Component Value Date   PLT 148 (L) 01/24/2020    Antibiotics  :    Anti-infectives (From admission, onward)   Start     Dose/Rate Route Frequency Ordered Stop   01/24/20 1345  amoxicillin-clavulanate (AUGMENTIN) 500-125 MG per tablet 500 mg     1 tablet Oral Every 24 hours 01/24/20 1340     01/22/20 2200  piperacillin-tazobactam (ZOSYN) IVPB 2.25 g  Status:  Discontinued     2.25 g 100 mL/hr over 30 Minutes Intravenous Every 8 hours 01/22/20 1831 01/24/20 1339   01/22/20 1530  cefTRIAXone (ROCEPHIN) 2 g in sodium chloride 0.9 % 100 mL IVPB     2 g 200 mL/hr over 30 Minutes Intravenous  Once 01/22/20 1527 01/22/20 1630   01/22/20 1530  metroNIDAZOLE (FLAGYL) IVPB 500 mg     500 mg 100 mL/hr over 60 Minutes Intravenous  Once 01/22/20 1527 01/22/20 1709        Objective:   Vitals:   01/24/20 0613 01/24/20 0704 01/24/20 0805 01/24/20 1203  BP:  95/63 94/62 (!) 89/54  Pulse:  76 79   Resp:  19 16 20   Temp:   97.6 F (36.4 C) 98.4 F (36.9 C)  TempSrc:   Oral Oral  SpO2:  (!) 89% 93% 96%  Weight: 123 kg     Height:        Wt Readings from Last 3 Encounters:  01/24/20 123 kg  01/21/20 125 kg  01/11/20 128 kg     Intake/Output Summary (Last 24 hours) at 01/24/2020 1439 Last data filed at 01/24/2020 1314 Gross per 24 hour  Intake 952.09 ml  Output 0 ml  Net 952.09 ml     Physical Exam  Awake Alert, Oriented X 3, No new F.N deficits, Normal affect Symmetrical Chest wall movement, Good air movement bilaterally, CTAB RRR,No Gallops,Rubs or new Murmurs, No Parasternal Heave +ve B.Sounds, Abd Soft, No tenderness, No rebound - guarding or rigidity. No Cyanosis, Clubbing or edema, No new Rash  or bruise      Data Review:    CBC Recent Labs  Lab 01/18/20 0816 01/18/20 0816 01/19/20 0446 01/20/20 0309 01/22/20 1515 01/23/20 0302 01/24/20 0323  WBC 26.3*   < > 19.3* 12.1* 15.6* 13.4* 12.2*  HGB 11.1*   < > 10.9* 10.5* 10.7* 9.4* 8.8*   HCT 36.0*   < > 37.0* 35.6* 36.8* 32.0* 28.0*  PLT 219   < > 238 203 183 162 148*  MCV 76.1*   < > 77.4* 78.2* 80.0 80.0 76.7*  MCH 23.5*   < > 22.8* 23.1* 23.3* 23.5* 24.1*  MCHC 30.8   < > 29.5* 29.5* 29.1* 29.4* 31.4  RDW 18.9*   < > 19.6* 19.4* 20.9* 20.5* 20.4*  LYMPHSABS 0.8  --  1.9 2.2 1.7  --   --   MONOABS 1.6*  --  0.6 0.8 1.1*  --   --   EOSABS 0.0  --  0.2 0.5 0.0  --   --   BASOSABS 0.0  --  0.0 0.1 0.0  --   --    < > = values in this interval not displayed.    Chemistries  Recent Labs  Lab 01/18/20 0816 01/18/20 0816 01/19/20 0446 01/20/20 0309 01/22/20 1515 01/23/20 0302 01/24/20 0323  NA 136   < > 138 138 140 141 138  K 4.6   < > 3.8 4.1 4.5 4.1 3.2*  CL 100   < > 100 102 106 108 101  CO2 15*   < > 18* 17* 13* 13* 21*  GLUCOSE 209*   < > 149* 142* 164* 126* 93  BUN 82*   < > 94* 95* 92* 92* 43*  CREATININE 14.26*   < > 16.56* 16.37* 18.13* 19.34* 11.85*  CALCIUM 9.0   < > 8.5* 8.4* 8.4* 8.6* 7.9*  MG 2.1  --  2.3 2.2  --  2.2 1.8  AST 13*   < > 11* 10* 12* 8* 13*  ALT 11   < > 13 12 13 12 14   ALKPHOS 78   < > 78 90 97 89 82  BILITOT 0.4   < > 0.5 0.6 0.7 0.7 0.6   < > = values in this interval not displayed.   ------------------------------------------------------------------------------------------------------------------ No results for input(s): CHOL, HDL, LDLCALC, TRIG, CHOLHDL, LDLDIRECT in the last 72 hours.  Lab Results  Component Value Date   HGBA1C 10.5 (H) 08/08/2018   ------------------------------------------------------------------------------------------------------------------ Recent Labs    01/23/20 0814  TSH 2.372   ------------------------------------------------------------------------------------------------------------------ No results for input(s): VITAMINB12, FOLATE, FERRITIN, TIBC, IRON, RETICCTPCT in the last 72 hours.  Coagulation profile Recent Labs  Lab 01/18/20 0816 01/22/20 1515  INR 1.2 1.2    No results  for input(s): DDIMER in the last 72 hours.  Cardiac Enzymes No results for input(s): CKMB, TROPONINI, MYOGLOBIN in the last 168 hours.  Invalid input(s): CK ------------------------------------------------------------------------------------------------------------------    Component Value Date/Time   BNP 28.8 01/20/2020 0309    Inpatient Medications  Scheduled Meds: . amoxicillin-clavulanate  1 tablet Oral Q24H  . Chlorhexidine Gluconate Cloth  6 each Topical Daily  . Chlorhexidine Gluconate Cloth  6 each Topical Q0600  . cinacalcet  90 mg Oral Q M,W,F  . darbepoetin (ARANESP) injection - NON-DIALYSIS  100 mcg Subcutaneous Q Thu-1800  . ferric citrate  420 mg Oral BID WC  . heparin  5,000 Units Subcutaneous Q8H  . insulin aspart  0-5 Units Subcutaneous QHS  . insulin  aspart  0-9 Units Subcutaneous TID WC  . insulin aspart  2 Units Subcutaneous TID WC  . insulin detemir  20 Units Subcutaneous QHS  . midodrine  5 mg Oral BID WC  . pantoprazole  40 mg Oral Daily  . pravastatin  20 mg Oral Daily   Continuous Infusions: . sodium chloride Stopped (01/24/20 0611)  . sodium chloride 1,000 mL (01/24/20 1314)   PRN Meds:.sodium chloride, acetaminophen **OR** acetaminophen, lip balm, mirtazapine, polyethylene glycol, senna-docusate  Micro Results Recent Results (from the past 240 hour(s))  Blood Culture (routine x 2)     Status: None (Preliminary result)   Collection Time: 01/22/20  3:15 PM   Specimen: BLOOD  Result Value Ref Range Status   Specimen Description BLOOD LEFT ANTECUBITAL  Final   Special Requests   Final    BOTTLES DRAWN AEROBIC AND ANAEROBIC Blood Culture results may not be optimal due to an inadequate volume of blood received in culture bottles   Culture   Final    NO GROWTH 2 DAYS Performed at [facility], [place] [address] [place], [place] [address]    Report Status PENDING  Incomplete  Blood Culture (routine x 2)     Status: None (Preliminary result)    Collection Time: 01/22/20  3:40 PM   Specimen: BLOOD  Result Value Ref Range Status   Specimen Description BLOOD RIGHT ANTECUBITAL  Final   Special Requests   Final    BOTTLES DRAWN AEROBIC AND ANAEROBIC Blood Culture results may not be optimal due to an inadequate volume of blood received in culture bottles   Culture   Final    NO GROWTH 2 DAYS Performed at [facility], [place] [address] [place], [place] [address]    Report Status PENDING  Incomplete  C Difficile Quick Screen w PCR reflex     Status: None   Collection Time: 01/22/20  5:03 PM   Specimen: Stool  Result Value Ref Range Status   C Diff antigen NEGATIVE NEGATIVE Final   C Diff toxin NEGATIVE NEGATIVE Final   C Diff interpretation No C. difficile detected.  Final    Comment: Performed at [facility], [place] [address], [place], [place] [address]    Radiology Reports CT ABDOMEN PELVIS WO CONTRAST  Result Date: 01/13/2020 CLINICAL DATA:  Follow-up of COVID pneumonia. Lung nodule on chest radiograph. Diffuse abdominal pain. EXAM: CT CHEST, ABDOMEN AND PELVIS WITHOUT CONTRAST TECHNIQUE: Multidetector CT imaging of the chest, abdomen and pelvis was performed following the standard protocol without IV contrast. COMPARISON:  Chest radiograph 01/12/2020. CT of the abdomen and pelvis of 12/05/2018. CTA of the chest of 03/04/2017. FINDINGS: CT CHEST FINDINGS Cardiovascular: A left sided central line terminates in the right atrium. Aortic atherosclerosis. Tortuous thoracic aorta. Moderate cardiomegaly, without pericardial effusion. Multivessel coronary artery atherosclerosis. Pulmonary artery enlargement, outflow tract 3.6 cm. 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 Mediastinum/Nodes: Right paratracheal node of 1.1 cm Hilar regions poorly evaluated without intravenous contrast. Lungs/Pleura: No pleural fluid.  Mild centrilobular emphysema. Right greater than left peripheral upper lobe airspace  disease with mild surrounding ground-glass. Musculoskeletal: No acute osseous abnormality. CT ABDOMEN PELVIS FINDINGS Hepatobiliary: Hepatomegaly at 19.2 cm craniocaudal. Hepatic morphology, including caudate lobe enlargement and irregular hepatic capsule, highly suspicious for mild cirrhosis. Normal gallbladder, without biliary ductal dilatation. Pancreas: Normal, without mass or ductal dilatation. Spleen: Normal in size, without focal abnormality. Adrenals/Urinary Tract: Mild bilateral adrenal thickening. Native renal  atrophy on the left. Right nephrectomy. Multiple low-density left renal lesions are likely cysts. No bladder calculi. Stomach/Bowel: Normal stomach, without wall thickening. Scattered colonic diverticula. Wall thickening within the sigmoid with mild surrounding edema, including on 95/3. No pericolonic fluid collection or free perforation. Separate area of pericolonic edema posterior to the ascending colon including on 84/3. Vascular/Lymphatic: Aortic and branch vessel atherosclerosis. Reproductive: No abdominopelvic adenopathy.  Normal prostate. Other: Fat containing left inguinal hernia. No significant free fluid. Tiny periumbilical fat containing ventral abdominal wall laxity. Musculoskeletal: Renal osteodystrophy.  Lumbosacral spondylosis. IMPRESSION: CT CHEST IMPRESSION 1. Right greater than left posterior upper lobe airspace and ground-glass opacity. Most consistent with pneumonia. This is not atypical appearance of COVID-19 pneumonia, which cannot be excluded. Other infectious etiologies should also be considered. 2. Aortic atherosclerosis (ICD10-I70.0), coronary artery atherosclerosis and emphysema (ICD10-J43.9). 3. Mild thoracic adenopathy, likely reactive. 4. Pulmonary artery enlargement suggests pulmonary arterial hypertension. CT ABDOMEN AND PELVIS IMPRESSION 1. Sigmoid wall thickening and pericolonic edema, likely related to non complicated diverticulitis. Separate edema posterior to the  ascending colon is nonspecific and could represent an area of colitis or mild diverticulitis. 2. Hepatomegaly. Hepatic morphology, highly suspicious for cirrhosis. Electronically Signed   By: Abigail Miyamoto M.D.   On: 01/13/2020 12:36   CT CHEST WO CONTRAST  Result Date: 01/13/2020 CLINICAL DATA:  Follow-up of COVID pneumonia. Lung nodule on chest radiograph. Diffuse abdominal pain. EXAM: CT CHEST, ABDOMEN AND PELVIS WITHOUT CONTRAST TECHNIQUE: Multidetector CT imaging of the chest, abdomen and pelvis was performed following the standard protocol without IV contrast. COMPARISON:  Chest radiograph 01/12/2020. CT of the abdomen and pelvis of 12/05/2018. CTA of the chest of 03/04/2017. FINDINGS: CT CHEST FINDINGS Cardiovascular: A left sided central line terminates in the right atrium. Aortic atherosclerosis. Tortuous thoracic aorta. Moderate cardiomegaly, without pericardial effusion. Multivessel coronary artery atherosclerosis. Pulmonary artery enlargement, outflow tract 3.6 cm. 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 Mediastinum/Nodes: Right paratracheal node of 1.1 cm Hilar regions poorly evaluated without intravenous contrast. Lungs/Pleura: No pleural fluid.  Mild centrilobular emphysema. Right greater than left peripheral upper lobe airspace disease with mild surrounding ground-glass. Musculoskeletal: No acute osseous abnormality. CT ABDOMEN PELVIS FINDINGS Hepatobiliary: Hepatomegaly at 19.2 cm craniocaudal. Hepatic morphology, including caudate lobe enlargement and irregular hepatic capsule, highly suspicious for mild cirrhosis. Normal gallbladder, without biliary ductal dilatation. Pancreas: Normal, without mass or ductal dilatation. Spleen: Normal in size, without focal abnormality. Adrenals/Urinary Tract: Mild bilateral adrenal thickening. Native renal atrophy on the left. Right nephrectomy. Multiple low-density left renal lesions are likely cysts. No bladder calculi.  Stomach/Bowel: Normal stomach, without wall thickening. Scattered colonic diverticula. Wall thickening within the sigmoid with mild surrounding edema, including on 95/3. No pericolonic fluid collection or free perforation. Separate area of pericolonic edema posterior to the ascending colon including on 84/3. Vascular/Lymphatic: Aortic and branch vessel atherosclerosis. Reproductive: No abdominopelvic adenopathy.  Normal prostate. Other: Fat containing left inguinal hernia. No significant free fluid. Tiny periumbilical fat containing ventral abdominal wall laxity. Musculoskeletal: Renal osteodystrophy.  Lumbosacral spondylosis. IMPRESSION: CT CHEST IMPRESSION 1. Right greater than left posterior upper lobe airspace and ground-glass opacity. Most consistent with pneumonia. This is not atypical appearance of COVID-19 pneumonia, which cannot be excluded. Other infectious etiologies should also be considered. 2. Aortic atherosclerosis (ICD10-I70.0), coronary artery atherosclerosis and emphysema (ICD10-J43.9). 3. Mild thoracic adenopathy, likely reactive. 4. Pulmonary artery enlargement suggests pulmonary arterial hypertension. CT ABDOMEN AND PELVIS IMPRESSION 1. Sigmoid wall thickening and pericolonic edema, likely related  to non complicated diverticulitis. Separate edema posterior to the ascending colon is nonspecific and could represent an area of colitis or mild diverticulitis. 2. Hepatomegaly. Hepatic morphology, highly suspicious for cirrhosis. Electronically Signed   By: Abigail Miyamoto M.D.   On: 01/13/2020 12:36   IR Removal Tun Cv Cath W/O FL  Result Date: 01/16/2020 INDICATION: Streptococcus pneumoniae bacteremia. Request for removal of tunneled hemodialysis catheter. Initially placed at an outside facility. EXAM: REMOVAL OF TUNNELED HEMODIALYSIS CATHETER MEDICATIONS: 1% lidocaine with epinephrine 4 mL COMPLICATIONS: None immediate. PROCEDURE: Informed written consent was obtained from the patient following an  explanation of the procedure, risks, benefits and alternatives to treatment. A time out was performed prior to the initiation of the procedure. Maximal barrier sterile technique was utilized including caps, mask, sterile gowns, sterile gloves, large sterile drape, hand hygiene, and Betadine. 1% lidocaine with epinephrine was injected under sterile conditions along the subcutaneous tunnel. Utilizing a combination of blunt dissection and gentle traction, the catheter was removed intact. Hemostasis was obtained with manual compression. A dressing was placed. The patient tolerated the procedure well without immediate post procedural complication. IMPRESSION: Successful removal of tunneled dialysis catheter. Read by: Gareth Eagle, PA-C Electronically Signed   By: Jacqulynn Cadet M.D.   On: 01/16/2020 14:54   DG Chest Port 1 View  Result Date: 01/22/2020 CLINICAL DATA:  Hypotension. EXAM: PORTABLE CHEST 1 VIEW COMPARISON:  Radiograph 01/14/2020, CT 01/13/2020 FINDINGS: Left dialysis catheter with tip at the atrial caval junction. Cardiomegaly is similar with unchanged mediastinal contours. Patchy right upper lobe opacities which are similar to recent prior exam. No new airspace disease. No pulmonary edema, large pleural effusion or pneumothorax. No acute osseous abnormalities are seen. IMPRESSION: Patchy right upper lobe opacities, similar to recent prior exam. Stable cardiomegaly. No acute findings. Electronically Signed   By: Keith Rake M.D.   On: 01/22/2020 15:36   DG Chest Port 1 View  Result Date: 01/14/2020 CLINICAL DATA:  Shortness of breath, COVID positive EXAM: PORTABLE CHEST 1 VIEW COMPARISON:  01/12/2020 FINDINGS: Left dialysis catheter remains in place, unchanged. Patchy airspace disease in the right upper lobe again noted. Overall, airspace disease throughout the right lung has improved. No confluent opacity on the left. No effusions. No acute bony abnormality. Heart is borderline in size.  IMPRESSION: Improving airspace disease with mild residual patchy right upper lobe opacities. Electronically Signed   By: Rolm Baptise M.D.   On: 01/14/2020 11:33   DG Chest Port 1 View  Result Date: 01/12/2020 CLINICAL DATA:  Short of breath EXAM: PORTABLE CHEST 1 VIEW COMPARISON:  01/11/2020 FINDINGS: Right upper lobe airspace disease unchanged. Improved aeration in the lung bases with improved lung volume. Negative for edema or effusion. Central venous catheter tip in the right atrium unchanged. IMPRESSION: Persistent right upper lobe airspace disease unchanged Improved aeration lungs with decrease in bibasilar atelectasis. Electronically Signed   By: Franchot Gallo M.D.   On: 01/12/2020 09:16   DG Chest Port 1 View  Result Date: 01/11/2020 CLINICAL DATA:  Shortness of breath EXAM: PORTABLE CHEST 1 VIEW COMPARISON:  01/10/2019 FINDINGS: There is bilateral diffuse interstitial thickening. There is a nodular appearance along the right suprahilar region likely reflecting prominent pulmonary vasculature or airspace disease. There is no pleural effusion or pneumothorax. The heart mediastinum are stable. There is a large bore left-sided central venous catheter with the tip projecting over the cavoatrial junction. There is no acute osseous abnormality. IMPRESSION: Diffuse bilateral interstitial thickening likely reflecting interstitial  edema. Nodular appearance along the right suprahilar region likely reflecting prominent pulmonary vasculature or airspace disease. Electronically Signed   By: Kathreen Devoid   On: 01/11/2020 16:31   DG Chest Port 1 View  Result Date: 01/11/2020 CLINICAL DATA:  Cough, short of breath EXAM: PORTABLE CHEST 1 VIEW COMPARISON:  03/07/2017 FINDINGS: Single frontal view of the chest demonstrates a left internal jugular dialysis catheter tip overlying atrial caval junction. Vascular stent overlies the right axilla. Cardiac silhouette is stable. There is a 7.3 cm mass projecting over the  right anterior first and second ribs. CT chest with IV contrast recommended for suspected neoplasm. There is central vascular congestion with diffuse interstitial prominence consistent with mild fluid overload. Right hilar prominence could also reflect lymphadenopathy. No effusion or pneumothorax. No acute bony abnormality. IMPRESSION: 1. 7.3 cm right upper lobe mass consistent with neoplasm. CT chest with IV contrast is recommended. 2. Right hilar prominence could reflect underlying adenopathy. 3. Vascular congestion and interstitial edema. Electronically Signed   By: Randa Ngo M.D.   On: 01/11/2020 04:02      Phillips Climes M.D on 01/24/2020 at 2:39 PM  Between 7am to 7pm - Pager - [phone]  After 7pm go to [email or web address] - password [email or web address]  [facility] -  Office  [phone]

## 2020-01-24 NOTE — Progress Notes (Signed)
   01/24/20 0437  MEWS Score  BP (!) 75/52  Pulse Rate 77  ECG Heart Rate 77  Resp 18  SpO2 92 %  O2 Device Room Air  MEWS Score  MEWS Temp 0  MEWS Systolic 2  MEWS Pulse 0  MEWS RR 0  MEWS LOC 0  MEWS Score 2  MEWS Score Color Yellow  MEWS Assessment  Is this an acute change? No  Provider Notification  Provider Name/Title Baltazar Najjar, NP  Date Provider Notified 01/24/20  Time Provider Notified [phone]  Notification Type Page  Notification Reason Other (Comment) (Hypotension. Rechecked, both arms.)  Response See new orders  Date of Provider Response 01/24/20  Time of Provider Response [phone]   Will continue to monitor.

## 2020-01-24 NOTE — Progress Notes (Addendum)
[facility] Progress Note  Subjective:  Patient not examined today directly given COVID-19 + status, utilizing data taken from chart +/- discussions w/ providers and staff.   No fluid off yest on HD.    Vitals:   01/24/20 0613 01/24/20 0704 01/24/20 0805 01/24/20 1203  BP:  95/63 94/62 (!) 89/54  Pulse:  76 79   Resp:  19 16 20   Temp:   97.6 F (36.4 C) 98.4 F (36.9 C)  TempSrc:   Oral Oral  SpO2:  (!) 89% 93% 96%  Weight: 123 kg     Height:        Exam:  Patient not examined today directly given COVID-19 + status, utilizing data taken from chart +/- discussions w/ providers and staff.      Home meds:  - carvedilol 3.125 bid/ furosemide 40 qd  - pravachol 20  - allopurinol 100 bid/ omeprazole 40  - insulin detemir 20 hs  - mirtazapine 15 hs prn  - cinacalcet 90 mwf/ auryxia 420 bid ac    Outpt HD: ([facility]) > TTS Covid now  5h  550/800  125kg  2/2 bath  P4  AVG  14g Hep 10K+ 2K midrun  - mircera 60 q2, last at [facility]  - clac 3.25 ug tiw  - sensipar 180 IV tiw  - auryxia 3 ac tid    CXR 3/9 - mostly clear , no edema  Assessment/ Plan: 1. Hypotension - IVF's given and midodrine added. Getting IV abx as well. Blood cx's neg so far x 2. BP's improved yest but down again overnight. Will run IVF"s overnight.   2. ESRD - HD yest and plan HD again today to get back on sched.  3. HTN/ volume - BP's low, midodrine started. Under dry wt by 2kg, plan IVF"s as above.  No fluid off w/ HD.  4. IDDM - per primary  5. Anemia ckd - Hb 9-10 range, no recent esa given, will give darbe 100ug SQ today then weekly.  6. MBC ckd - Ca ok, phos not done yet, cont meds 7. COVID infection - tested + first on 01/11/20   Rob Annaliz Aven 01/24/2020, 12:25 PM   Recent Labs  Lab 01/23/20 0302 01/24/20 0323  K 4.1 3.2*  BUN 92* 43*  CREATININE 19.34* 11.85*  CALCIUM 8.6* 7.9*  HGB 9.4* 8.8*   Inpatient medications: . Chlorhexidine Gluconate Cloth  6 each Topical Daily   . Chlorhexidine Gluconate Cloth  6 each Topical Q0600  . cinacalcet  90 mg Oral Q M,W,F  . ferric citrate  420 mg Oral BID WC  . heparin  5,000 Units Subcutaneous Q8H  . insulin aspart  0-5 Units Subcutaneous QHS  . insulin aspart  0-9 Units Subcutaneous TID WC  . insulin aspart  2 Units Subcutaneous TID WC  . insulin detemir  20 Units Subcutaneous QHS  . midodrine  5 mg Oral BID WC  . pantoprazole  40 mg Oral Daily  . pravastatin  20 mg Oral Daily   . sodium chloride Stopped (01/24/20 2671)  . piperacillin-tazobactam (ZOSYN)  IV Stopped (01/24/20 0622)   sodium chloride, acetaminophen **OR** acetaminophen, lip balm, mirtazapine, polyethylene glycol, senna-docusate

## 2020-01-25 DIAGNOSIS — Z992 Dependence on renal dialysis: Secondary | ICD-10-CM | POA: Diagnosis not present

## 2020-01-25 DIAGNOSIS — N186 End stage renal disease: Secondary | ICD-10-CM | POA: Diagnosis not present

## 2020-01-25 DIAGNOSIS — E1129 Type 2 diabetes mellitus with other diabetic kidney complication: Secondary | ICD-10-CM | POA: Diagnosis not present

## 2020-01-25 LAB — COMPREHENSIVE METABOLIC PANEL
ALT: 14 U/L (ref 0–44)
ALT: 14 U/L (ref 0–44)
AST: 13 U/L — ABNORMAL LOW (ref 15–41)
AST: 14 U/L — ABNORMAL LOW (ref 15–41)
Albumin: 2.5 g/dL — ABNORMAL LOW (ref 3.5–5.0)
Albumin: 2.4 g/dL — ABNORMAL LOW (ref 3.5–5.0)
Alkaline Phosphatase: 82 U/L (ref 38–126)
Alkaline Phosphatase: 83 U/L (ref 38–126)
Anion gap: 16 — ABNORMAL HIGH (ref 5–15)
Anion gap: 16 — ABNORMAL HIGH (ref 5–15)
BUN: 43 mg/dL — ABNORMAL HIGH (ref 8–23)
BUN: 48 mg/dL — ABNORMAL HIGH (ref 8–23)
CO2: 21 mmol/L — ABNORMAL LOW (ref 22–32)
CO2: 20 mmol/L — ABNORMAL LOW (ref 22–32)
Calcium: 7.9 mg/dL — ABNORMAL LOW (ref 8.9–10.3)
Calcium: 8.5 mg/dL — ABNORMAL LOW (ref 8.9–10.3)
Chloride: 101 mmol/L (ref 98–111)
Chloride: 105 mmol/L (ref 98–111)
Creatinine, Ser: 11.85 mg/dL — ABNORMAL HIGH (ref 0.61–1.24)
Creatinine, Ser: 13.82 mg/dL — ABNORMAL HIGH (ref 0.61–1.24)
GFR calc Af Amer: 5 mL/min — ABNORMAL LOW (ref >60)
GFR calc Af Amer: 4 mL/min — ABNORMAL LOW (ref >60)
GFR calc non Af Amer: 4 mL/min — ABNORMAL LOW (ref >60)
GFR calc non Af Amer: 3 mL/min — ABNORMAL LOW (ref >60)
Glucose, Bld: 93 mg/dL (ref 70–99)
Glucose, Bld: 116 mg/dL — ABNORMAL HIGH (ref 70–99)
Potassium: 3.2 mmol/L — ABNORMAL LOW (ref 3.5–5.1)
Potassium: 3.2 mmol/L — ABNORMAL LOW (ref 3.5–5.1)
Sodium: 138 mmol/L (ref 135–145)
Sodium: 141 mmol/L (ref 135–145)
Total Bilirubin: 0.6 mg/dL (ref 0.3–1.2)
Total Bilirubin: 0.6 mg/dL (ref 0.3–1.2)
Total Protein: 6.2 g/dL — ABNORMAL LOW (ref 6.5–8.1)
Total Protein: 5.9 g/dL — ABNORMAL LOW (ref 6.5–8.1)

## 2020-01-25 LAB — GI PATHOGEN PANEL BY PCR, STOOL
Adenovirus F 40/41: NOT DETECTED
Astrovirus: NOT DETECTED
Campylobacter by PCR: NOT DETECTED
Cryptosporidium by PCR: NOT DETECTED
Cyclospora cayetanensis: NOT DETECTED
E coli (ETEC) LT/ST: NOT DETECTED
E coli (STEC): NOT DETECTED
E coli 0157 by PCR: NOT DETECTED
Entamoeba histolytica: NOT DETECTED
Enteroaggregative E coli: NOT DETECTED
Enteropathogenic E coli: NOT DETECTED
G lamblia by PCR: NOT DETECTED
Norovirus GI/GII: NOT DETECTED
Plesiomonas shigelloides: NOT DETECTED
Rotavirus A by PCR: NOT DETECTED
Salmonella by PCR: NOT DETECTED
Sapovirus: NOT DETECTED
Shigella by PCR: NOT DETECTED
Vibrio cholerae: NOT DETECTED
Vibrio: NOT DETECTED
Yersinia enterocolitica: NOT DETECTED

## 2020-01-25 LAB — GLUCOSE, CAPILLARY
Glucose-Capillary: 78 mg/dL (ref 70–99)
Glucose-Capillary: 100 mg/dL — ABNORMAL HIGH (ref 70–99)

## 2020-01-25 LAB — CBC
HCT: 27.3 % — ABNORMAL LOW (ref 39.0–52.0)
Hemoglobin: 8.1 g/dL — ABNORMAL LOW (ref 13.0–17.0)
MCH: 23.5 pg — ABNORMAL LOW (ref 26.0–34.0)
MCHC: 29.7 g/dL — ABNORMAL LOW (ref 30.0–36.0)
MCV: 79.1 fL — ABNORMAL LOW (ref 80.0–100.0)
Platelets: 156 10*3/uL (ref 150–400)
RBC: 3.45 MIL/uL — ABNORMAL LOW (ref 4.22–5.81)
RDW: 20.2 % — ABNORMAL HIGH (ref 11.5–15.5)
WBC: 10.6 10*3/uL — ABNORMAL HIGH (ref 4.0–10.5)
nRBC: 0.2 % (ref 0.0–0.2)

## 2020-01-25 LAB — MAGNESIUM: Magnesium: 1.8 mg/dL (ref 1.7–2.4)

## 2020-01-25 MED ORDER — MIDODRINE HCL 5 MG PO TABS: 5.0000 mg | ORAL_TABLET | Freq: Two times a day (BID) | ORAL | 0 refills | Status: DC

## 2020-01-25 MED ORDER — MIDODRINE HCL 5 MG PO TABS
5.0000 mg | ORAL_TABLET | Freq: Three times a day (TID) | ORAL | Status: DC
  Administered 2020-01-25: 5 mg via ORAL
  Filled 2020-01-25: qty 1

## 2020-01-25 NOTE — Consult Note (Signed)
   [facility] CM Inpatient Consult   01/25/2020  [name] [birth year] [id number]   Follow up:  Inpatient [name] team for disposition   Patient for [name] to Home with COVID, [name] HD with COVID pt. Noted.  Patient with HHPT as well.  No other needs assessed at this time.  For questions,  Natividad Brood, RN BSN [facility] Liaison  [phone] business mobile phone Toll free office [phone]  Fax number: [phone] [email or web address]

## 2020-01-25 NOTE — Progress Notes (Signed)
Nicholas Millers to be D/C'd Home per MD order.  Discussed with the patient and all questions fully answered.  VSS, Skin clean, dry and intact without evidence of skin break down, no evidence of skin tears noted. IV catheter discontinued intact. Site without signs and symptoms of complications. Dressing and pressure applied.  An After Visit Summary was printed and given to the patient. Patient received prescription.  D/c education completed with patient/family including follow up instructions, medication list, d/c activities limitations if indicated, with other d/c instructions as indicated by MD - patient able to verbalize understanding, all questions fully answered.   Patient instructed to return to ED, call 911, or call MD for any changes in condition.   Patient escorted via [place], and D/C home via private auto.  Pt verbelize he will go to his HD treatment Saturday 01/26/2020.    Lorenza [facility]    01/25/2020 12:50 PM

## 2020-01-25 NOTE — TOC Transition Note (Addendum)
Transition of Care [facility]) - CM/SW Discharge Note   Patient Details  Name: [name] MRN: [id number] Date of Birth: Feb 27, 1956  [facility] CM/SW Contact:  Maryclare Labrador, RN Phone Number: 01/25/2020, 12:15 PM   Clinical Narrative:   Pt deemed stable for discharge home today.  Pt now in agreement with HHPT only   - CM offered [email or web address] HH choice and pt chose [facility].  Agency accepts referral.  Pt declined RW and informed by CM that " I can manage with my cane". Covid at home referral made earlier during this stay.   No other CM needs determined - CM signing off    Final next level of care: [place] Barriers to Discharge: Continued Medical Work up   Patient Goals and CMS Choice Patient states their goals for this hospitalization and ongoing recovery are:: Pt states he is ready to get back home and will accept physical therapy CMS Medicare.gov Compare Post Acute Care list provided to:: Patient Choice offered to / list presented to : Patient  Discharge Placement                       Discharge Plan and Services     Post Acute Care Choice: (Pt in now in agreement for COVID at home program - CM requested order from attending)                    [place] Arranged: PT [place]: [place] ([place]) Date [place]: 01/25/20 Time [place]: 1215 Representative spoke with at [place]: [place] ([place]) Interventions     Readmission Risk Interventions Readmission Risk Prevention Plan 01/23/2020 01/23/2020 01/21/2020  Transportation Screening Complete Complete Complete  PCP or Specialist Appt within 3-5 Days Complete - Complete  HRI or Home Care Consult Patient refused - Patient refused  Palliative Care Screening - - Not Applicable  Medication Review (RN Care Manager) - - Complete  Some recent data might be hidden

## 2020-01-25 NOTE — Progress Notes (Signed)
Renal Navigator notes that patient is discharging today. Navigator has already discussed OP HD treatment plan for COVID positive patient with patient's wife at length and let her know that he will be cleared to return to his home clinic by [facility] isolation shift (TTS 12:00pm) staff. [facility] to [facility] for his next HD treatment tomorrow, Saturday, 01/26/20 at noon. Clinic notified.  Alphonzo Cruise, [place] Renal Navigator [phone]

## 2020-01-25 NOTE — Progress Notes (Signed)
PT Cancellation Note  Patient Details Name: [name] MRN: [id number] DOB: 1956-08-11   Cancelled Treatment:    Reason Eval/Treat Not Completed: Other (comment)(patient washing up at sink, awaiting his clothes)  Patient shaving at sink, awaiting his clothes that are being brought up from outside. Patient declined PT session at this time. PT attempted again at 1219 and patient on the phone with continued conversation.   [name] 01/25/2020, 12:23 PM

## 2020-01-25 NOTE — Discharge Summary (Signed)
[name], is a 64 y.o. male  DOB 03/15/1956  MRN [id number].  Admission date:  01/22/2020  Admitting Physician  Ankit Arsenio Loader, MD  Discharge Date:  01/25/2020   Primary MD  [name], MD  Recommendations for primary care physician for things to follow:  - please adjust antihypertensive regimen as needed, he is on midodrine on discharge, and I have stopped his Coreg as well. -Repeat imaging to follow-up on questionable right upper lobe mass(CT more suspicious of infiltrate hence complete antibiotic course and then repeat imaging in 2 to 3 weeks by PCP if question persists outpatient pulmonary follow-up) -Patient was offered home health, but he declined    Admission Diagnosis  Sepsis ([place]) [A41.9] Severe comorbid illness [R69] Sepsis, due to unspecified organism, unspecified whether acute organ dysfunction present [facility]) [A41.9]   Discharge Diagnosis  Sepsis ([place]) [A41.9] Severe comorbid illness [R69] Sepsis, due to unspecified organism, unspecified whether acute organ dysfunction present ([place]) [A41.9]    Principal Problem:   Sepsis ([place]) Active Problems:   Hypertension   Tobacco abuse   Diabetes mellitus ([place])   End stage renal disease ([place])   Chronic hepatitis C without hepatic coma ([place])   ESRD (end stage renal disease) on dialysis ([place])   Gram-neg septicemia ([place])   Diabetes mellitus type 2, uncontrolled ([place])      Past Medical History:  Diagnosis Date  . Alcohol abuse   . Congestive heart disease ([place]) 12/2011  . Diabetes mellitus   . GERD (gastroesophageal reflux disease)   . Gout   . Headache   . Hepatitis    being treated for Hepatitis C  . HOH (hard of hearing)   . Hyperlipidemia   . Hyperlipidemia   . Hypertension   . Monoclonal gammopathies 02/25/2012  . Obesity   . Pedal edema   . Pneumonia   . Renal insufficiency   . Sleep apnea    uses cpap  . Tobacco  abuse   . Wears hearing aid    b/l    Past Surgical History:  Procedure Laterality Date  . AV FISTULA PLACEMENT Right 01/10/2014   Procedure: ARTERIOVENOUS (AV) FISTULA CREATION- RIGHT RADIOCEPHALIC ;  Surgeon: Angelia Mould, MD;  Location: [facility];  Service: Vascular;  Laterality: Right;  . COLONOSCOPY    . EYE SURGERY     cataract surgery, bilateral  . Ganglion cyst removal x 2    . INSERTION OF DIALYSIS CATHETER N/A 01/07/2014   Procedure: INSERTION OF DIALYSIS CATHETER;  Surgeon: Conrad Napili-[place], MD;  Location: [facility] OR;  Service: Vascular;  Laterality: N/A;  . IR REMOVAL TUN CV CATH W/O FL  01/16/2020  . Left knee arthroscopy with generalized joint synovectomy,  01/2006  . LIGATION OF COMPETING BRANCHES OF ARTERIOVENOUS FISTULA Right 05/28/2014   Procedure: LIGATION OF COMPETING BRANCHES OF ARTERIOVENOUS FISTULA;  Surgeon: Angelia Mould, MD;  Location: [facility];  Service: Vascular;  Laterality: Right;  . REVISON OF ARTERIOVENOUS FISTULA Right 04/19/9934   Procedure: PLICATION  REPAIR OF ARTERIOVENOUS FISTULA PSEUDOANEURYSM;  Surgeon: Serafina Mitchell, MD;  Location: [facility];  Service: Vascular;  Laterality: Right;  . REVISON OF ARTERIOVENOUS FISTULA Right 08/08/2018   Procedure: REVISION PLICATION OF ARTERIOVENOUS FISTULA FOREARM;  Surgeon: Angelia Mould, MD;  Location: [facility];  Service: Vascular;  Laterality: Right;  . REVISON OF ARTERIOVENOUS FISTULA Right 08/02/2019   Procedure: Artegraft placement right arm arteriovenous graft;  Surgeon: Waynetta Sandy, MD;  Location: [facility];  Service: Vascular;  Laterality: Right;  . SCROTAL EXPLORATION N/A 03/11/2017   Procedure: SCROTUM EXPLORATION AND DEBRIDEMENT;  Surgeon: Franchot Gallo, MD;  Location: [facility];  Service: Urology;  Laterality: N/A;  . SCROTAL EXPLORATION N/A 03/21/2017   Procedure: REPEAT SCROTAL DEBRIDEMENT;  Surgeon: Franchot Gallo, MD;  Location: [facility];  Service: Urology;  Laterality: N/A;  . SHUNTOGRAM Right  05/14/2014   Procedure: FISTULOGRAM;  Surgeon: Serafina Mitchell, MD;  Location: [facility] CATH LAB;  Service: Cardiovascular;  Laterality: Right;  . THROMBECTOMY W/ EMBOLECTOMY Right 07/04/2017   Procedure: EXPLORATION AND THROMBECTOMY ARTERIOVENOUS FISTULA RIGHT ARM;  Surgeon: Rosetta Posner, MD;  Location: [place];  Service: Vascular;  Laterality: Right;       History of present illness and  Hospital Course:     Kindly see H&P for history of present illness and admission details, please review complete Labs, Consult reports and Test reports for all details in brief  HPI  from the history and physical done on the day of admission 01/22/2020  HPI: [name] is a 64 y.o. male with medical history significant of recent Streptococcus bacteremia on outpatient Augmentin, COVID-19 infection status post routine treatment still under isolation, ESRD hemodialysis, DM2, HTN, HLD, RCC status post nephrectomy was brought to the hospital for evaluation of hypotension and hypothermia.  Patient was discharged yesterday from the hospital after being treated for COVID-19 infection along with Streptococcus bacteremia.  During previous infection his hemodialysis catheter was exchanged.  After going home he states he was doing well.  He thinks he took his blood pressure medication this morning determined to his dialysis center, 15 minutes into his dialysis he was noted to be hypotensive.  Small fluid bolus was given without much response therefore brought to the hospital.  In the hospital initially his systolic blood pressure was in 50s which responded to IV fluid, his rectal temperature was 96.6.  Cultures were performed and started on Rocephin and Flagyl.  Medical team was requested to admit the patient  When I saw the patient his systolic blood pressure was in low 100s, appeared quite comfortable saturating 95% on 2 L nasal cannula.  Tells me he thinks he took his a.m. blood pressure medications and did not realize he  was not supposed to take them prior to his dialysis.  Hospital Course    Sepsis ruled out this admission   Hypotension -Appears to be multifactorial, given recent pain hospitalization, as well diarrhea, and patient taking antihypertensive medication, and hemodialysis, as well he appears to be on the dry side, where he is under 2 kg under his dry weight. -This is unrelated to sepsis, no sepsis this admission. -Coreg has been stopped, has been started, he was listed in his dry weight so he did receive some IV fluid as well, time of discharge is blood pressure has been stable, no dizziness or orthostasis with activity and ambulation, he will be discharged on midodrine and no further Coreg.  ESRD -Input greatly appreciated, patient missed his  dialysis day of admission given hypotension, renal were consulted, and patient was dialyzed during hospital stay.  Is to continue his dialysis schedule on TTS  Strep bacteremia -Patient with Streptococcus bacteremia during previous admissions, was on Augmentin, he was started on Zosyn on admission till sepsis ruled out, blood cultures were negative, he is back on his Augmentin with stop date 01/26/2020 .  Questionable right upper lobe mass -During previous hospitalization initial imaging with questionable right upper lobe mass, but  on CT more suspicious of infiltrate hence complete antibiotic course and then repeat imaging in 2 to 3 weeks by PCP if question persists outpatient pulmonary follow-up. -Discussed these findings at length with the patient, and he is aware of the importance of the follow-up.  Diarrhea -Secondary to Covid, D diff negative, resolved  Anemia of chronic disease -Hemoglobin stable  Hyperlipidemia -Statin  Essential hypertension -Antihypertensives on hold for now  History of left foot Charcot joint Diabetes mellitus type 2, uncontrolled secondary to hyperglycemia -Noninfected 1 cm shallow ulcer. Supportive  care. -Previous hemoglobin A1c 10.5. Insulin sliding scale and Accu-Chek.  Obstructive sleep apnea -Not on CPAP at home  GERD -PPI    Discharge Condition:  stable   Follow UP  Follow-up Information    [name], MD Follow up in 2 week(s).   Specialty: Family Medicine Contact information: [address], [place] [address] [phone]             Discharge Instructions  and  Discharge Medications     Discharge Instructions    Discharge instructions   Complete by: As directed    Follow with Primary MD [name], MD in 14 days   Get CBC, CMP, 2 view Chest X ray checked  by Primary MD next visit.    Activity: As tolerated with Full fall precautions use walker/cane & assistance as needed   Disposition Home    Diet: Renal/CArb modified, with feeding assistance and aspiration precautions.  For Heart failure patients - Check your Weight same time everyday, if you gain over 2 pounds, or you develop in leg swelling, experience more shortness of breath or chest pain, call your Primary MD immediately. Follow Cardiac Low Salt Diet and 1.5 lit/day fluid restriction.   On your next visit with your primary care physician please Get Medicines reviewed and adjusted.   Please request your Prim.MD to go over all Hospital Tests and Procedure/Radiological results at the follow up, please get all Hospital records sent to your Prim MD by signing hospital release before you go home.   If you experience worsening of your admission symptoms, develop shortness of breath, life threatening emergency, suicidal or homicidal thoughts you must seek medical attention immediately by calling 911 or calling your MD immediately  if symptoms less severe.  You Must read complete instructions/literature along with all the possible adverse reactions/side effects for all the Medicines you take and that have been prescribed to you. Take any new Medicines after you have  completely understood and accpet all the possible adverse reactions/side effects.   Do not drive, operating heavy machinery, perform activities at heights, swimming or participation in water activities or provide baby sitting services if your were admitted for syncope or siezures until you have seen by Primary MD or a Neurologist and advised to do so again.  Do not drive when taking Pain medications.    Do not take more than prescribed Pain, Sleep and Anxiety Medications  Special Instructions: If you have  smoked or chewed Tobacco  in the last 2 yrs please stop smoking, stop any regular Alcohol  and or any Recreational drug use.  Wear Seat belts while driving.   Please note  You were cared for by a hospitalist during your hospital stay. If you have any questions about your discharge medications or the care you received while you were in the hospital after you are discharged, you can call the unit and asked to speak with the hospitalist on call if the hospitalist that took care of you is not available. Once you are discharged, your primary care physician will handle any further medical issues. Please note that NO REFILLS for any discharge medications will be authorized once you are discharged, as it is imperative that you return to your primary care physician (or establish a relationship with a primary care physician if you do not have one) for your aftercare needs so that they can reassess your need for medications and monitor your lab values.   Increase activity slowly   Complete by: As directed      Allergies as of 01/25/2020      Reactions   Penicillins Nausea And Vomiting, Other (See Comments)   Tolerated Augmentin. Has patient had a PCN reaction causing immediate rash, facial/tongue/throat swelling, SOB or lightheadedness with hypotension: No Has patient had a PCN reaction causing severe rash involving mucus membranes or skin necrosis: No Has patient had a PCN reaction that required  hospitalization No Has patient had a PCN reaction occurring within the last 10 years: No If all of the above answers are "NO", then may proceed with Cephalosporin use.   Propolis Nausea And Vomiting   Vioxx [rofecoxib] Nausea And Vomiting      Medication List    STOP taking these medications   carvedilol 3.125 MG tablet Commonly known as: Coreg   furosemide 40 MG tablet Commonly known as: LASIX     TAKE these medications   acetaminophen 325 MG tablet Commonly known as: TYLENOL Take 650 mg by mouth every 6 (six) hours as needed for mild pain, moderate pain, fever or headache.   allopurinol 100 MG tablet Commonly known as: ZYLOPRIM Take 1 tablet (100 mg total) by mouth daily. What changed: when to take this   amoxicillin-clavulanate 500-125 MG tablet Commonly known as: Augmentin Take 1 tablet (500 mg total) by mouth 2 (two) times daily for 5 days. Take through 01/20/2020   Auryxia 1 GM 210 MG(Fe) tablet Generic drug: ferric citrate Take 420 mg by mouth 2 (two) times daily with a meal.   cinacalcet 90 MG tablet Commonly known as: SENSIPAR Take 90 mg by mouth every Monday, Wednesday, and Friday.   diphenhydrAMINE 25 MG tablet Commonly known as: BENADRYL Take 25 mg by mouth every Monday, Wednesday, and Friday.   Levemir FlexTouch 100 UNIT/ML FlexPen Generic drug: insulin detemir Inject 20 Units into the skin at bedtime.   midodrine 5 MG tablet Commonly known as: PROAMATINE Take 1 tablet (5 mg total) by mouth 2 (two) times daily with a meal.   mirtazapine 15 MG tablet Commonly known as: REMERON Take 15 mg by mouth at bedtime as needed (sleep).   omeprazole 20 MG capsule Commonly known as: PRILOSEC Take 20 mg by mouth daily.   ondansetron 4 MG tablet Commonly known as: ZOFRAN Take 4 mg by mouth 3 (three) times daily as needed for nausea/vomiting.   pravastatin 20 MG tablet Commonly known as: PRAVACHOL Take 20 mg by mouth daily.  Durable Medical  Equipment  (From admission, onward)         Start     Ordered   01/25/20 1114  DME Walker  Once    Question Answer Comment  Walker: With 5 Inch Wheels   Patient needs a walker to treat with the following condition Weakness      01/25/20 1114            Diet and Activity recommendation: See Discharge Instructions above   Consults obtained - renal   Major procedures and Radiology Reports - PLEASE review detailed and final reports for all details, in brief -      CT ABDOMEN PELVIS WO CONTRAST  Result Date: 01/13/2020 CLINICAL DATA:  Follow-up of COVID pneumonia. Lung nodule on chest radiograph. Diffuse abdominal pain. EXAM: CT CHEST, ABDOMEN AND PELVIS WITHOUT CONTRAST TECHNIQUE: Multidetector CT imaging of the chest, abdomen and pelvis was performed following the standard protocol without IV contrast. COMPARISON:  Chest radiograph 01/12/2020. CT of the abdomen and pelvis of 12/05/2018. CTA of the chest of 03/04/2017. FINDINGS: CT CHEST FINDINGS Cardiovascular: A left sided central line terminates in the right atrium. Aortic atherosclerosis. Tortuous thoracic aorta. Moderate cardiomegaly, without pericardial effusion. Multivessel coronary artery atherosclerosis. Pulmonary artery enlargement, outflow tract 3.6 cm. 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 Mediastinum/Nodes: Right paratracheal node of 1.1 cm Hilar regions poorly evaluated without intravenous contrast. Lungs/Pleura: No pleural fluid.  Mild centrilobular emphysema. Right greater than left peripheral upper lobe airspace disease with mild surrounding ground-glass. Musculoskeletal: No acute osseous abnormality. CT ABDOMEN PELVIS FINDINGS Hepatobiliary: Hepatomegaly at 19.2 cm craniocaudal. Hepatic morphology, including caudate lobe enlargement and irregular hepatic capsule, highly suspicious for mild cirrhosis. Normal gallbladder, without biliary ductal dilatation. Pancreas: Normal, without mass  or ductal dilatation. Spleen: Normal in size, without focal abnormality. Adrenals/Urinary Tract: Mild bilateral adrenal thickening. Native renal atrophy on the left. Right nephrectomy. Multiple low-density left renal lesions are likely cysts. No bladder calculi. Stomach/Bowel: Normal stomach, without wall thickening. Scattered colonic diverticula. Wall thickening within the sigmoid with mild surrounding edema, including on 95/3. No pericolonic fluid collection or free perforation. Separate area of pericolonic edema posterior to the ascending colon including on 84/3. Vascular/Lymphatic: Aortic and branch vessel atherosclerosis. Reproductive: No abdominopelvic adenopathy.  Normal prostate. Other: Fat containing left inguinal hernia. No significant free fluid. Tiny periumbilical fat containing ventral abdominal wall laxity. Musculoskeletal: Renal osteodystrophy.  Lumbosacral spondylosis. IMPRESSION: CT CHEST IMPRESSION 1. Right greater than left posterior upper lobe airspace and ground-glass opacity. Most consistent with pneumonia. This is not atypical appearance of COVID-19 pneumonia, which cannot be excluded. Other infectious etiologies should also be considered. 2. Aortic atherosclerosis (ICD10-I70.0), coronary artery atherosclerosis and emphysema (ICD10-J43.9). 3. Mild thoracic adenopathy, likely reactive. 4. Pulmonary artery enlargement suggests pulmonary arterial hypertension. CT ABDOMEN AND PELVIS IMPRESSION 1. Sigmoid wall thickening and pericolonic edema, likely related to non complicated diverticulitis. Separate edema posterior to the ascending colon is nonspecific and could represent an area of colitis or mild diverticulitis. 2. Hepatomegaly. Hepatic morphology, highly suspicious for cirrhosis. Electronically Signed   By: Abigail Miyamoto M.D.   On: 01/13/2020 12:36   CT CHEST WO CONTRAST  Result Date: 01/13/2020 CLINICAL DATA:  Follow-up of COVID pneumonia. Lung nodule on chest radiograph. Diffuse abdominal  pain. EXAM: CT CHEST, ABDOMEN AND PELVIS WITHOUT CONTRAST TECHNIQUE: Multidetector CT imaging of the chest, abdomen and pelvis was performed following the standard protocol without IV contrast. COMPARISON:  Chest radiograph 01/12/2020. CT of the  abdomen and pelvis of 12/05/2018. CTA of the chest of 03/04/2017. FINDINGS: CT CHEST FINDINGS Cardiovascular: A left sided central line terminates in the right atrium. Aortic atherosclerosis. Tortuous thoracic aorta. Moderate cardiomegaly, without pericardial effusion. Multivessel coronary artery atherosclerosis. Pulmonary artery enlargement, outflow tract 3.6 cm. 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 Mediastinum/Nodes: Right paratracheal node of 1.1 cm Hilar regions poorly evaluated without intravenous contrast. Lungs/Pleura: No pleural fluid.  Mild centrilobular emphysema. Right greater than left peripheral upper lobe airspace disease with mild surrounding ground-glass. Musculoskeletal: No acute osseous abnormality. CT ABDOMEN PELVIS FINDINGS Hepatobiliary: Hepatomegaly at 19.2 cm craniocaudal. Hepatic morphology, including caudate lobe enlargement and irregular hepatic capsule, highly suspicious for mild cirrhosis. Normal gallbladder, without biliary ductal dilatation. Pancreas: Normal, without mass or ductal dilatation. Spleen: Normal in size, without focal abnormality. Adrenals/Urinary Tract: Mild bilateral adrenal thickening. Native renal atrophy on the left. Right nephrectomy. Multiple low-density left renal lesions are likely cysts. No bladder calculi. Stomach/Bowel: Normal stomach, without wall thickening. Scattered colonic diverticula. Wall thickening within the sigmoid with mild surrounding edema, including on 95/3. No pericolonic fluid collection or free perforation. Separate area of pericolonic edema posterior to the ascending colon including on 84/3. Vascular/Lymphatic: Aortic and branch vessel atherosclerosis. Reproductive:  No abdominopelvic adenopathy.  Normal prostate. Other: Fat containing left inguinal hernia. No significant free fluid. Tiny periumbilical fat containing ventral abdominal wall laxity. Musculoskeletal: Renal osteodystrophy.  Lumbosacral spondylosis. IMPRESSION: CT CHEST IMPRESSION 1. Right greater than left posterior upper lobe airspace and ground-glass opacity. Most consistent with pneumonia. This is not atypical appearance of COVID-19 pneumonia, which cannot be excluded. Other infectious etiologies should also be considered. 2. Aortic atherosclerosis (ICD10-I70.0), coronary artery atherosclerosis and emphysema (ICD10-J43.9). 3. Mild thoracic adenopathy, likely reactive. 4. Pulmonary artery enlargement suggests pulmonary arterial hypertension. CT ABDOMEN AND PELVIS IMPRESSION 1. Sigmoid wall thickening and pericolonic edema, likely related to non complicated diverticulitis. Separate edema posterior to the ascending colon is nonspecific and could represent an area of colitis or mild diverticulitis. 2. Hepatomegaly. Hepatic morphology, highly suspicious for cirrhosis. Electronically Signed   By: Abigail Miyamoto M.D.   On: 01/13/2020 12:36   IR Removal Tun Cv Cath W/O FL  Result Date: 01/16/2020 INDICATION: Streptococcus pneumoniae bacteremia. Request for removal of tunneled hemodialysis catheter. Initially placed at an outside facility. EXAM: REMOVAL OF TUNNELED HEMODIALYSIS CATHETER MEDICATIONS: 1% lidocaine with epinephrine 4 mL COMPLICATIONS: None immediate. PROCEDURE: Informed written consent was obtained from the patient following an explanation of the procedure, risks, benefits and alternatives to treatment. A time out was performed prior to the initiation of the procedure. Maximal barrier sterile technique was utilized including caps, mask, sterile gowns, sterile gloves, large sterile drape, hand hygiene, and Betadine. 1% lidocaine with epinephrine was injected under sterile conditions along the subcutaneous  tunnel. Utilizing a combination of blunt dissection and gentle traction, the catheter was removed intact. Hemostasis was obtained with manual compression. A dressing was placed. The patient tolerated the procedure well without immediate post procedural complication. IMPRESSION: Successful removal of tunneled dialysis catheter. Read by: Gareth [place], PA-C Electronically Signed   By: Jacqulynn Cadet M.D.   On: 01/16/2020 14:54   DG Chest Port 1 View  Result Date: 01/22/2020 CLINICAL DATA:  Hypotension. EXAM: PORTABLE CHEST 1 VIEW COMPARISON:  Radiograph 01/14/2020, CT 01/13/2020 FINDINGS: Left dialysis catheter with tip at the atrial caval junction. Cardiomegaly is similar with unchanged mediastinal contours. Patchy right upper lobe opacities which are similar to recent prior exam. No new airspace  disease. No pulmonary edema, large pleural effusion or pneumothorax. No acute osseous abnormalities are seen. IMPRESSION: Patchy right upper lobe opacities, similar to recent prior exam. Stable cardiomegaly. No acute findings. Electronically Signed   By: Keith Rake M.D.   On: 01/22/2020 15:36   DG Chest Port 1 View  Result Date: 01/14/2020 CLINICAL DATA:  Shortness of breath, COVID positive EXAM: PORTABLE CHEST 1 VIEW COMPARISON:  01/12/2020 FINDINGS: Left dialysis catheter remains in place, unchanged. Patchy airspace disease in the right upper lobe again noted. Overall, airspace disease throughout the right lung has improved. No confluent opacity on the left. No effusions. No acute bony abnormality. Heart is borderline in size. IMPRESSION: Improving airspace disease with mild residual patchy right upper lobe opacities. Electronically Signed   By: Rolm Baptise M.D.   On: 01/14/2020 11:33   DG Chest Port 1 View  Result Date: 01/12/2020 CLINICAL DATA:  Short of breath EXAM: PORTABLE CHEST 1 VIEW COMPARISON:  01/11/2020 FINDINGS: Right upper lobe airspace disease unchanged. Improved aeration in the lung bases  with improved lung volume. Negative for edema or effusion. Central venous catheter tip in the right atrium unchanged. IMPRESSION: Persistent right upper lobe airspace disease unchanged Improved aeration lungs with decrease in bibasilar atelectasis. Electronically Signed   By: Franchot Gallo M.D.   On: 01/12/2020 09:16   DG Chest Port 1 View  Result Date: 01/11/2020 CLINICAL DATA:  Shortness of breath EXAM: PORTABLE CHEST 1 VIEW COMPARISON:  01/10/2019 FINDINGS: There is bilateral diffuse interstitial thickening. There is a nodular appearance along the right suprahilar region likely reflecting prominent pulmonary vasculature or airspace disease. There is no pleural effusion or pneumothorax. The heart mediastinum are stable. There is a large bore left-sided central venous catheter with the tip projecting over the cavoatrial junction. There is no acute osseous abnormality. IMPRESSION: Diffuse bilateral interstitial thickening likely reflecting interstitial edema. Nodular appearance along the right suprahilar region likely reflecting prominent pulmonary vasculature or airspace disease. Electronically Signed   By: Kathreen Devoid   On: 01/11/2020 16:31   DG Chest Port 1 View  Result Date: 01/11/2020 CLINICAL DATA:  Cough, short of breath EXAM: PORTABLE CHEST 1 VIEW COMPARISON:  03/07/2017 FINDINGS: Single frontal view of the chest demonstrates a left internal jugular dialysis catheter tip overlying atrial caval junction. Vascular stent overlies the right axilla. Cardiac silhouette is stable. There is a 7.3 cm mass projecting over the right anterior first and second ribs. CT chest with IV contrast recommended for suspected neoplasm. There is central vascular congestion with diffuse interstitial prominence consistent with mild fluid overload. Right hilar prominence could also reflect lymphadenopathy. No effusion or pneumothorax. No acute bony abnormality. IMPRESSION: 1. 7.3 cm right upper lobe mass consistent with  neoplasm. CT chest with IV contrast is recommended. 2. Right hilar prominence could reflect underlying adenopathy. 3. Vascular congestion and interstitial edema. Electronically Signed   By: Randa Ngo M.D.   On: 01/11/2020 04:02    Micro Results     Recent Results (from the past 240 hour(s))  Blood Culture (routine x 2)     Status: None (Preliminary result)   Collection Time: 01/22/20  3:15 PM   Specimen: BLOOD  Result Value Ref Range Status   Specimen Description BLOOD LEFT ANTECUBITAL  Final   Special Requests   Final    BOTTLES DRAWN AEROBIC AND ANAEROBIC Blood Culture results may not be optimal due to an inadequate volume of blood received in culture bottles   Culture  Final    NO GROWTH 3 DAYS Performed at [facility], [place] [address] [place], [place] [address]    Report Status PENDING  Incomplete  Blood Culture (routine x 2)     Status: None (Preliminary result)   Collection Time: 01/22/20  3:40 PM   Specimen: BLOOD  Result Value Ref Range Status   Specimen Description BLOOD RIGHT ANTECUBITAL  Final   Special Requests   Final    BOTTLES DRAWN AEROBIC AND ANAEROBIC Blood Culture results may not be optimal due to an inadequate volume of blood received in culture bottles   Culture   Final    NO GROWTH 3 DAYS Performed at [facility], [place] [address] [place], [place] [address]    Report Status PENDING  Incomplete  GI pathogen panel by PCR, stool     Status: None   Collection Time: 01/22/20  5:03 PM   Specimen: Stool  Result Value Ref Range Status   Plesiomonas shigelloides NOT DETECTED NOT DETECTED Final   Yersinia enterocolitica NOT DETECTED NOT DETECTED Final   Vibrio NOT DETECTED NOT DETECTED Final   Enteropathogenic E coli NOT DETECTED NOT DETECTED Final   E coli (ETEC) LT/ST NOT DETECTED NOT DETECTED Final   E coli 0157 by PCR NOT DETECTED NOT DETECTED Final   Cryptosporidium by PCR NOT DETECTED NOT DETECTED Final   Entamoeba histolytica NOT  DETECTED NOT DETECTED Final   Adenovirus F 40/41 NOT DETECTED NOT DETECTED Final   Norovirus GI/GII NOT DETECTED NOT DETECTED Final   Sapovirus NOT DETECTED NOT DETECTED Final    Comment: (NOTE) Performed At: [facility] [place], [place] [id number] Rush Farmer MD JS:[id number]    Vibrio cholerae NOT DETECTED NOT DETECTED Final   Campylobacter by PCR NOT DETECTED NOT DETECTED Final   Salmonella by PCR NOT DETECTED NOT DETECTED Final   E coli (STEC) NOT DETECTED NOT DETECTED Final   Enteroaggregative E coli NOT DETECTED NOT DETECTED Final   Shigella by PCR NOT DETECTED NOT DETECTED Final   Cyclospora cayetanensis NOT DETECTED NOT DETECTED Final   Astrovirus NOT DETECTED NOT DETECTED Final   G lamblia by PCR NOT DETECTED NOT DETECTED Final   Rotavirus A by PCR NOT DETECTED NOT DETECTED Final  C Difficile Quick Screen w PCR reflex     Status: None   Collection Time: 01/22/20  5:03 PM   Specimen: Stool  Result Value Ref Range Status   C Diff antigen NEGATIVE NEGATIVE Final   C Diff toxin NEGATIVE NEGATIVE Final   C Diff interpretation No C. difficile detected.  Final    Comment: Performed at [facility], [place] [address], [place], [place] [address]       Today   Subjective:   Sedale Jenifer today has no headache,no chest abdominal pain,no new weakness tingling or numbness, feels much better wants to go home today.  Objective:   Blood pressure 131/69, pulse 69, temperature 97.9 F (36.6 C), temperature source Oral, resp. rate (!) 21, height 6\' 1"  (1.854 m), weight 125.8 kg, SpO2 97 %.   Intake/Output Summary (Last 24 hours) at 01/25/2020 1130 Last data filed at 01/24/2020 1837 Gross per 24 hour  Intake 867.73 ml  Output --  Net 867.73 ml    Exam Awake Alert, Oriented x 3, No new F.N deficits, Normal affect Symmetrical Chest wall movement, Good air movement bilaterally, CTAB RRR,No Gallops,Rubs or new Murmurs, No Parasternal Heave +ve  B.Sounds, Abd Soft, Non tender, No organomegaly appriciated, No rebound -guarding or rigidity. No Cyanosis,  No new Rash or bruise  Data Review   CBC w Diff:  Lab Results  Component Value Date   WBC 10.6 (H) 01/25/2020   HGB 8.1 (L) 01/25/2020   HGB 13.5 03/02/2012   HCT 27.3 (L) 01/25/2020   HCT 43.2 03/02/2012   PLT 156 01/25/2020   PLT 206 03/02/2012   LYMPHOPCT 11 01/22/2020   LYMPHOPCT 27.9 03/02/2012   BANDSPCT 6 03/10/2017   MONOPCT 7 01/22/2020   MONOPCT 9.2 03/02/2012   EOSPCT 0 01/22/2020   EOSPCT 1.4 03/02/2012   BASOPCT 0 01/22/2020   BASOPCT 0.3 03/02/2012    CMP:  Lab Results  Component Value Date   NA 141 01/25/2020   K 3.2 (L) 01/25/2020   CL 105 01/25/2020   CO2 20 (L) 01/25/2020   BUN 48 (H) 01/25/2020   CREATININE 13.82 (H) 01/25/2020   CREATININE 16.29 (H) 11/17/2016   PROT 5.9 (L) 01/25/2020   ALBUMIN 2.4 (L) 01/25/2020   BILITOT 0.6 01/25/2020   ALKPHOS 83 01/25/2020   AST 14 (L) 01/25/2020   ALT 14 01/25/2020   ALT 60 (H) 11/17/2016  .   Total Time in preparing paper work, data evaluation and todays exam - 81 minutes  Phillips Climes M.D on 01/25/2020 at 11:30 AM  [facility]   Office  [phone]

## 2020-01-25 NOTE — Discharge Instructions (Signed)
Person Under Monitoring Name: [name]  Location: [place] [address]   Infection Prevention Recommendations for Individuals Confirmed to have, or Being Evaluated for, 2019 Novel Coronavirus (COVID-19) Infection Who Receive Care at Home  Individuals who are confirmed to have, or are being evaluated for, COVID-19 should follow the prevention steps below until a healthcare provider or local or state health department says they can return to normal activities.  Stay home except to get medical care You should restrict activities outside your home, except for getting medical care. Do not go to work, school, or public areas, and do not use public transportation or taxis.  Call ahead before visiting your doctor Before your medical appointment, call the healthcare provider and tell them that you have, or are being evaluated for, COVID-19 infection. This will help the healthcare provider's office take steps to keep other people from getting infected. Ask your healthcare provider to call the local or state health department.  Monitor your symptoms Seek prompt medical attention if your illness is worsening (e.g., difficulty breathing). Before going to your medical appointment, call the healthcare provider and tell them that you have, or are being evaluated for, COVID-19 infection. Ask your healthcare provider to call the local or state health department.  Wear a facemask You should wear a facemask that covers your nose and mouth when you are in the same room with other people and when you visit a healthcare provider. People who live with or visit you should also wear a facemask while they are in the same room with you.  Separate yourself from other people in your home As much as possible, you should stay in a different room from other people in your home. Also, you should use a separate bathroom, if available.  Avoid sharing household items You should not share  dishes, drinking glasses, cups, eating utensils, towels, bedding, or other items with other people in your home. After using these items, you should wash them thoroughly with soap and water.  Cover your coughs and sneezes Cover your mouth and nose with a tissue when you cough or sneeze, or you can cough or sneeze into your sleeve. Throw used tissues in a lined trash can, and immediately wash your hands with soap and water for at least 20 seconds or use an alcohol-based hand rub.  Wash your [facility] your hands often and thoroughly with soap and water for at least 20 seconds. You can use an alcohol-based hand sanitizer if soap and water are not available and if your hands are not visibly dirty. Avoid touching your eyes, nose, and mouth with unwashed hands.   Prevention Steps for Caregivers and Household Members of Individuals Confirmed to have, or Being Evaluated for, COVID-19 Infection Being Cared for in the Home  If you live with, or provide care at home for, a person confirmed to have, or being evaluated for, COVID-19 infection please follow these guidelines to prevent infection:  Follow healthcare provider's instructions Make sure that you understand and can help the patient follow any healthcare provider instructions for all care.  Provide for the patient's basic needs You should help the patient with basic needs in the home and provide support for getting groceries, prescriptions, and other personal needs.  Monitor the patient's symptoms If they are getting sicker, call his or her medical provider and tell them that the patient has, or is being evaluated for, COVID-19 infection. This will help the healthcare provider's office  take steps to keep other people from getting infected. Ask the healthcare provider to call the local or state health department.  Limit the number of people who have contact with the patient  If possible, have only one caregiver for the patient.  Other  household members should stay in another home or place of residence. If this is not possible, they should stay  in another room, or be separated from the patient as much as possible. Use a separate bathroom, if available.  Restrict visitors who do not have an essential need to be in the home.  Keep older adults, very young children, and other sick people away from the patient Keep older adults, very young children, and those who have compromised immune systems or chronic health conditions away from the patient. This includes people with chronic heart, lung, or kidney conditions, diabetes, and cancer.  Ensure good ventilation Make sure that shared spaces in the home have good air flow, such as from an air conditioner or an opened window, weather permitting.  Wash your hands often  Wash your hands often and thoroughly with soap and water for at least 20 seconds. You can use an alcohol based hand sanitizer if soap and water are not available and if your hands are not visibly dirty.  Avoid touching your eyes, nose, and mouth with unwashed hands.  Use disposable paper towels to dry your hands. If not available, use dedicated cloth towels and replace them when they become wet.  Wear a facemask and gloves  Wear a disposable facemask at all times in the room and gloves when you touch or have contact with the patient's blood, body fluids, and/or secretions or excretions, such as sweat, saliva, sputum, nasal mucus, vomit, urine, or feces.  Ensure the mask fits over your nose and mouth tightly, and do not touch it during use.  Throw out disposable facemasks and gloves after using them. Do not reuse.  Wash your hands immediately after removing your facemask and gloves.  If your personal clothing becomes contaminated, carefully remove clothing and launder. Wash your hands after handling contaminated clothing.  Place all used disposable facemasks, gloves, and other waste in a lined container before  disposing them with other household waste.  Remove gloves and wash your hands immediately after handling these items.  Do not share dishes, glasses, or other household items with the patient  Avoid sharing household items. You should not share dishes, drinking glasses, cups, eating utensils, towels, bedding, or other items with a patient who is confirmed to have, or being evaluated for, COVID-19 infection.  After the person uses these items, you should wash them thoroughly with soap and water.  Wash laundry thoroughly  Immediately remove and wash clothes or bedding that have blood, body fluids, and/or secretions or excretions, such as sweat, saliva, sputum, nasal mucus, vomit, urine, or feces, on them.  Wear gloves when handling laundry from the patient.  Read and follow directions on labels of laundry or clothing items and detergent. In general, wash and dry with the warmest temperatures recommended on the label.  Clean all areas the individual has used often  Clean all touchable surfaces, such as counters, tabletops, doorknobs, bathroom fixtures, toilets, phones, keyboards, tablets, and bedside tables, every day. Also, clean any surfaces that may have blood, body fluids, and/or secretions or excretions on them.  Wear gloves when cleaning surfaces the patient has come in contact with.  Use a diluted bleach solution (e.g., dilute bleach with 1 part  bleach and 10 parts water) or a household disinfectant with a label that says EPA-registered for coronaviruses. To make a bleach solution at home, add 1 tablespoon of bleach to 1 quart (4 cups) of water. For a larger supply, add  cup of bleach to 1 gallon (16 cups) of water.  Read labels of cleaning products and follow recommendations provided on product labels. Labels contain instructions for safe and effective use of the cleaning product including precautions you should take when applying the product, such as wearing gloves or eye protection  and making sure you have good ventilation during use of the product.  Remove gloves and wash hands immediately after cleaning.  Monitor yourself for signs and symptoms of illness Caregivers and household members are considered close contacts, should monitor their health, and will be asked to limit movement outside of the home to the extent possible. Follow the monitoring steps for close contacts listed on the symptom monitoring form.   ? If you have additional questions, contact your local health department or call the epidemiologist on call at [phone] (available 24/7). ? This guidance is subject to change. For the most up-to-date guidance from [facility], please refer to their website: [email or web address]

## 2020-01-26 ENCOUNTER — Telehealth: Payer: Self-pay | Admitting: Nurse Practitioner

## 2020-01-26 DIAGNOSIS — Z992 Dependence on renal dialysis: Secondary | ICD-10-CM | POA: Diagnosis not present

## 2020-01-26 DIAGNOSIS — N2581 Secondary hyperparathyroidism of renal origin: Secondary | ICD-10-CM | POA: Diagnosis not present

## 2020-01-26 DIAGNOSIS — N186 End stage renal disease: Secondary | ICD-10-CM | POA: Diagnosis not present

## 2020-01-26 DIAGNOSIS — D509 Iron deficiency anemia, unspecified: Secondary | ICD-10-CM | POA: Diagnosis not present

## 2020-01-26 DIAGNOSIS — D631 Anemia in chronic kidney disease: Secondary | ICD-10-CM | POA: Diagnosis not present

## 2020-01-26 NOTE — Telephone Encounter (Signed)
Transition of care contact from inpatient facility  Date of Discharge: 01/25/2020 Date of Contact: 01/26/2020 Method of contact: Phone  Attempted to contact patient to discuss transition of care from inpatient admission. Patient did not answer the phone. Message was left on the patient's voicemail with call back number [phone].

## 2020-01-27 LAB — CULTURE, BLOOD (ROUTINE X 2)
Culture: NO GROWTH
Culture: NO GROWTH

## 2020-01-28 ENCOUNTER — Telehealth: Payer: Self-pay | Admitting: Nephrology

## 2020-01-28 DIAGNOSIS — R918 Other nonspecific abnormal finding of lung field: Secondary | ICD-10-CM | POA: Diagnosis not present

## 2020-01-28 DIAGNOSIS — M14672 Charcot's joint, left ankle and foot: Secondary | ICD-10-CM | POA: Diagnosis not present

## 2020-01-28 DIAGNOSIS — R5381 Other malaise: Secondary | ICD-10-CM | POA: Diagnosis not present

## 2020-01-28 DIAGNOSIS — I509 Heart failure, unspecified: Secondary | ICD-10-CM | POA: Diagnosis not present

## 2020-01-28 DIAGNOSIS — E1122 Type 2 diabetes mellitus with diabetic chronic kidney disease: Secondary | ICD-10-CM | POA: Diagnosis not present

## 2020-01-28 DIAGNOSIS — E782 Mixed hyperlipidemia: Secondary | ICD-10-CM | POA: Diagnosis not present

## 2020-01-28 DIAGNOSIS — N186 End stage renal disease: Secondary | ICD-10-CM | POA: Diagnosis not present

## 2020-01-28 DIAGNOSIS — G4730 Sleep apnea, unspecified: Secondary | ICD-10-CM | POA: Diagnosis not present

## 2020-01-28 DIAGNOSIS — M109 Gout, unspecified: Secondary | ICD-10-CM | POA: Diagnosis not present

## 2020-01-28 DIAGNOSIS — I10 Essential (primary) hypertension: Secondary | ICD-10-CM | POA: Diagnosis not present

## 2020-01-28 DIAGNOSIS — E0842 Diabetes mellitus due to underlying condition with diabetic polyneuropathy: Secondary | ICD-10-CM | POA: Diagnosis not present

## 2020-01-28 NOTE — Telephone Encounter (Signed)
Transition of care contact from inpatient facility  Date of Discharge: 01/25/20 Date of Contact:3/15 - no answer 3/13 and 3/14 Method of contact: telephone Talked with: patient  Patient contact to discuss transition of care from recent inpatient hospitalization. Pateint was admitted to [facility] from : 3/9 - 01/25/20 with the diagnosis of strep bacteremia, hypotension, COVID (prior admit still under isolation)   Medication changes were reviewed.  He has stopped coreg as advised.   Patient will follow up at outpatient dialysis on  3/16 - he actually went 3/13 and attained new lower edw of 125 kg (down 7 kg from prior EDW)  Other follow up needs: - he declined [facility] services.  He will need repeat imagine to follow up questionable right UL mass - this needs to be arranged by PCP Dr. [name].  Amalia Hailey, PA-C [facility] Pager:  [phone]

## 2020-01-29 ENCOUNTER — Ambulatory Visit: Payer: Medicare Other | Admitting: Podiatry

## 2020-01-29 DIAGNOSIS — D509 Iron deficiency anemia, unspecified: Secondary | ICD-10-CM | POA: Diagnosis not present

## 2020-01-29 DIAGNOSIS — N2581 Secondary hyperparathyroidism of renal origin: Secondary | ICD-10-CM | POA: Diagnosis not present

## 2020-01-29 DIAGNOSIS — D631 Anemia in chronic kidney disease: Secondary | ICD-10-CM | POA: Diagnosis not present

## 2020-01-29 DIAGNOSIS — U071 COVID-19: Secondary | ICD-10-CM | POA: Diagnosis not present

## 2020-01-29 DIAGNOSIS — Z992 Dependence on renal dialysis: Secondary | ICD-10-CM | POA: Diagnosis not present

## 2020-01-29 DIAGNOSIS — N186 End stage renal disease: Secondary | ICD-10-CM | POA: Diagnosis not present

## 2020-02-01 DIAGNOSIS — D631 Anemia in chronic kidney disease: Secondary | ICD-10-CM | POA: Diagnosis not present

## 2020-02-01 DIAGNOSIS — D509 Iron deficiency anemia, unspecified: Secondary | ICD-10-CM | POA: Diagnosis not present

## 2020-02-01 DIAGNOSIS — N2581 Secondary hyperparathyroidism of renal origin: Secondary | ICD-10-CM | POA: Diagnosis not present

## 2020-02-01 DIAGNOSIS — N186 End stage renal disease: Secondary | ICD-10-CM | POA: Diagnosis not present

## 2020-02-01 DIAGNOSIS — Z992 Dependence on renal dialysis: Secondary | ICD-10-CM | POA: Diagnosis not present

## 2020-02-04 DIAGNOSIS — N186 End stage renal disease: Secondary | ICD-10-CM | POA: Diagnosis not present

## 2020-02-04 DIAGNOSIS — D631 Anemia in chronic kidney disease: Secondary | ICD-10-CM | POA: Diagnosis not present

## 2020-02-04 DIAGNOSIS — D509 Iron deficiency anemia, unspecified: Secondary | ICD-10-CM | POA: Diagnosis not present

## 2020-02-04 DIAGNOSIS — N2581 Secondary hyperparathyroidism of renal origin: Secondary | ICD-10-CM | POA: Diagnosis not present

## 2020-02-04 DIAGNOSIS — Z992 Dependence on renal dialysis: Secondary | ICD-10-CM | POA: Diagnosis not present

## 2020-02-06 DIAGNOSIS — N2581 Secondary hyperparathyroidism of renal origin: Secondary | ICD-10-CM | POA: Diagnosis not present

## 2020-02-06 DIAGNOSIS — D509 Iron deficiency anemia, unspecified: Secondary | ICD-10-CM | POA: Diagnosis not present

## 2020-02-06 DIAGNOSIS — D631 Anemia in chronic kidney disease: Secondary | ICD-10-CM | POA: Diagnosis not present

## 2020-02-06 DIAGNOSIS — Z992 Dependence on renal dialysis: Secondary | ICD-10-CM | POA: Diagnosis not present

## 2020-02-06 DIAGNOSIS — N186 End stage renal disease: Secondary | ICD-10-CM | POA: Diagnosis not present

## 2020-02-07 DIAGNOSIS — F17200 Nicotine dependence, unspecified, uncomplicated: Secondary | ICD-10-CM | POA: Diagnosis not present

## 2020-02-07 DIAGNOSIS — I509 Heart failure, unspecified: Secondary | ICD-10-CM | POA: Diagnosis not present

## 2020-02-07 DIAGNOSIS — Z905 Acquired absence of kidney: Secondary | ICD-10-CM | POA: Diagnosis not present

## 2020-02-07 DIAGNOSIS — R918 Other nonspecific abnormal finding of lung field: Secondary | ICD-10-CM | POA: Diagnosis not present

## 2020-02-07 DIAGNOSIS — E669 Obesity, unspecified: Secondary | ICD-10-CM | POA: Diagnosis not present

## 2020-02-07 DIAGNOSIS — B182 Chronic viral hepatitis C: Secondary | ICD-10-CM | POA: Diagnosis not present

## 2020-02-07 DIAGNOSIS — K219 Gastro-esophageal reflux disease without esophagitis: Secondary | ICD-10-CM | POA: Diagnosis not present

## 2020-02-07 DIAGNOSIS — E785 Hyperlipidemia, unspecified: Secondary | ICD-10-CM | POA: Diagnosis not present

## 2020-02-07 DIAGNOSIS — D631 Anemia in chronic kidney disease: Secondary | ICD-10-CM | POA: Diagnosis not present

## 2020-02-07 DIAGNOSIS — N186 End stage renal disease: Secondary | ICD-10-CM | POA: Diagnosis not present

## 2020-02-07 DIAGNOSIS — Z85528 Personal history of other malignant neoplasm of kidney: Secondary | ICD-10-CM | POA: Diagnosis not present

## 2020-02-07 DIAGNOSIS — G4733 Obstructive sleep apnea (adult) (pediatric): Secondary | ICD-10-CM | POA: Diagnosis not present

## 2020-02-07 DIAGNOSIS — A419 Sepsis, unspecified organism: Secondary | ICD-10-CM | POA: Diagnosis not present

## 2020-02-07 DIAGNOSIS — Z794 Long term (current) use of insulin: Secondary | ICD-10-CM | POA: Diagnosis not present

## 2020-02-07 DIAGNOSIS — E1122 Type 2 diabetes mellitus with diabetic chronic kidney disease: Secondary | ICD-10-CM | POA: Diagnosis not present

## 2020-02-07 DIAGNOSIS — H9190 Unspecified hearing loss, unspecified ear: Secondary | ICD-10-CM | POA: Diagnosis not present

## 2020-02-07 DIAGNOSIS — Z992 Dependence on renal dialysis: Secondary | ICD-10-CM | POA: Diagnosis not present

## 2020-02-07 DIAGNOSIS — I132 Hypertensive heart and chronic kidney disease with heart failure and with stage 5 chronic kidney disease, or end stage renal disease: Secondary | ICD-10-CM | POA: Diagnosis not present

## 2020-02-07 DIAGNOSIS — U071 COVID-19: Secondary | ICD-10-CM | POA: Diagnosis not present

## 2020-02-07 DIAGNOSIS — Z6836 Body mass index (BMI) 36.0-36.9, adult: Secondary | ICD-10-CM | POA: Diagnosis not present

## 2020-02-07 DIAGNOSIS — M109 Gout, unspecified: Secondary | ICD-10-CM | POA: Diagnosis not present

## 2020-02-07 DIAGNOSIS — D472 Monoclonal gammopathy: Secondary | ICD-10-CM | POA: Diagnosis not present

## 2020-02-07 DIAGNOSIS — E1165 Type 2 diabetes mellitus with hyperglycemia: Secondary | ICD-10-CM | POA: Diagnosis not present

## 2020-02-08 DIAGNOSIS — N2581 Secondary hyperparathyroidism of renal origin: Secondary | ICD-10-CM | POA: Diagnosis not present

## 2020-02-08 DIAGNOSIS — D631 Anemia in chronic kidney disease: Secondary | ICD-10-CM | POA: Diagnosis not present

## 2020-02-08 DIAGNOSIS — D509 Iron deficiency anemia, unspecified: Secondary | ICD-10-CM | POA: Diagnosis not present

## 2020-02-08 DIAGNOSIS — N186 End stage renal disease: Secondary | ICD-10-CM | POA: Diagnosis not present

## 2020-02-08 DIAGNOSIS — Z992 Dependence on renal dialysis: Secondary | ICD-10-CM | POA: Diagnosis not present

## 2020-02-11 ENCOUNTER — Telehealth: Payer: Self-pay | Admitting: Internal Medicine

## 2020-02-11 DIAGNOSIS — N2581 Secondary hyperparathyroidism of renal origin: Secondary | ICD-10-CM | POA: Diagnosis not present

## 2020-02-11 DIAGNOSIS — D509 Iron deficiency anemia, unspecified: Secondary | ICD-10-CM | POA: Diagnosis not present

## 2020-02-11 DIAGNOSIS — D631 Anemia in chronic kidney disease: Secondary | ICD-10-CM | POA: Diagnosis not present

## 2020-02-11 DIAGNOSIS — Z992 Dependence on renal dialysis: Secondary | ICD-10-CM | POA: Diagnosis not present

## 2020-02-11 DIAGNOSIS — N186 End stage renal disease: Secondary | ICD-10-CM | POA: Diagnosis not present

## 2020-02-11 MED ORDER — MIDODRINE HCL 5 MG PO TABS: 5.0000 mg | ORAL_TABLET | Freq: Two times a day (BID) | ORAL | 0 refills | Status: DC

## 2020-02-11 NOTE — Telephone Encounter (Signed)
Dr. Tamala Julian,  This was prescribed for the patient at discharge can you please advise if this needs to be continued and if so are you willing to refill this?

## 2020-02-11 NOTE — Telephone Encounter (Signed)
He has an appt. With you on 02/18/20 at 11 with a cxr prior to visit

## 2020-02-13 DIAGNOSIS — D631 Anemia in chronic kidney disease: Secondary | ICD-10-CM | POA: Diagnosis not present

## 2020-02-13 DIAGNOSIS — N186 End stage renal disease: Secondary | ICD-10-CM | POA: Diagnosis not present

## 2020-02-13 DIAGNOSIS — D509 Iron deficiency anemia, unspecified: Secondary | ICD-10-CM | POA: Diagnosis not present

## 2020-02-13 DIAGNOSIS — N2581 Secondary hyperparathyroidism of renal origin: Secondary | ICD-10-CM | POA: Diagnosis not present

## 2020-02-13 DIAGNOSIS — Z992 Dependence on renal dialysis: Secondary | ICD-10-CM | POA: Diagnosis not present

## 2020-02-14 DIAGNOSIS — I132 Hypertensive heart and chronic kidney disease with heart failure and with stage 5 chronic kidney disease, or end stage renal disease: Secondary | ICD-10-CM | POA: Diagnosis not present

## 2020-02-14 DIAGNOSIS — D631 Anemia in chronic kidney disease: Secondary | ICD-10-CM | POA: Diagnosis not present

## 2020-02-14 DIAGNOSIS — N2581 Secondary hyperparathyroidism of renal origin: Secondary | ICD-10-CM | POA: Diagnosis not present

## 2020-02-14 DIAGNOSIS — A419 Sepsis, unspecified organism: Secondary | ICD-10-CM | POA: Diagnosis not present

## 2020-02-14 DIAGNOSIS — N186 End stage renal disease: Secondary | ICD-10-CM | POA: Diagnosis not present

## 2020-02-14 DIAGNOSIS — I509 Heart failure, unspecified: Secondary | ICD-10-CM | POA: Diagnosis not present

## 2020-02-14 DIAGNOSIS — A499 Bacterial infection, unspecified: Secondary | ICD-10-CM | POA: Diagnosis not present

## 2020-02-14 DIAGNOSIS — Z992 Dependence on renal dialysis: Secondary | ICD-10-CM | POA: Diagnosis not present

## 2020-02-14 DIAGNOSIS — D509 Iron deficiency anemia, unspecified: Secondary | ICD-10-CM | POA: Diagnosis not present

## 2020-02-14 DIAGNOSIS — E1122 Type 2 diabetes mellitus with diabetic chronic kidney disease: Secondary | ICD-10-CM | POA: Diagnosis not present

## 2020-02-14 DIAGNOSIS — E1129 Type 2 diabetes mellitus with other diabetic kidney complication: Secondary | ICD-10-CM | POA: Diagnosis not present

## 2020-02-15 DIAGNOSIS — N2581 Secondary hyperparathyroidism of renal origin: Secondary | ICD-10-CM | POA: Diagnosis not present

## 2020-02-15 DIAGNOSIS — Z992 Dependence on renal dialysis: Secondary | ICD-10-CM | POA: Diagnosis not present

## 2020-02-15 DIAGNOSIS — N186 End stage renal disease: Secondary | ICD-10-CM | POA: Diagnosis not present

## 2020-02-15 DIAGNOSIS — D509 Iron deficiency anemia, unspecified: Secondary | ICD-10-CM | POA: Diagnosis not present

## 2020-02-15 DIAGNOSIS — A499 Bacterial infection, unspecified: Secondary | ICD-10-CM | POA: Diagnosis not present

## 2020-02-15 DIAGNOSIS — D631 Anemia in chronic kidney disease: Secondary | ICD-10-CM | POA: Diagnosis not present

## 2020-02-18 ENCOUNTER — Telehealth: Payer: Self-pay | Admitting: Internal Medicine

## 2020-02-18 ENCOUNTER — Inpatient Hospital Stay: Payer: Medicare Other | Admitting: Internal Medicine

## 2020-02-18 DIAGNOSIS — D509 Iron deficiency anemia, unspecified: Secondary | ICD-10-CM | POA: Diagnosis not present

## 2020-02-18 DIAGNOSIS — N186 End stage renal disease: Secondary | ICD-10-CM | POA: Diagnosis not present

## 2020-02-18 DIAGNOSIS — D631 Anemia in chronic kidney disease: Secondary | ICD-10-CM | POA: Diagnosis not present

## 2020-02-18 DIAGNOSIS — Z992 Dependence on renal dialysis: Secondary | ICD-10-CM | POA: Diagnosis not present

## 2020-02-18 DIAGNOSIS — A499 Bacterial infection, unspecified: Secondary | ICD-10-CM | POA: Diagnosis not present

## 2020-02-18 DIAGNOSIS — N2581 Secondary hyperparathyroidism of renal origin: Secondary | ICD-10-CM | POA: Diagnosis not present

## 2020-02-18 NOTE — Telephone Encounter (Signed)
Pt was scheduled for a HFU today with Dr. Tamala Julian but he canceled the appointment. He has scheduled dialysis on M/W/F and the next available date with Dr. Tamala Julian isn't until May.  Dr. Tamala Julian - are you okay with pt seeing an NP for a HFU?

## 2020-02-18 NOTE — Telephone Encounter (Signed)
Have him see an app with CXR, app can call me if questions thank you

## 2020-02-18 NOTE — Telephone Encounter (Signed)
Spoke with pt. He has been scheduled to see Beth on 02/28/20 at 0900. Nothing further needed.

## 2020-02-19 ENCOUNTER — Telehealth: Payer: Self-pay

## 2020-02-19 ENCOUNTER — Inpatient Hospital Stay: Payer: Medicare Other | Admitting: Internal Medicine

## 2020-02-19 NOTE — Telephone Encounter (Signed)
Spoke to [place] this morning from [facility] regarding getting pt scheduled for surgery. Per Dr. Donnetta Hutching the pt can chose if he prefers Right BVT vs Left cephalic. I have passed this along to [place] at [facility] and she will discuss it with pt at [facility] this week and let us know so we can schedule pt's surgery.

## 2020-02-20 DIAGNOSIS — N186 End stage renal disease: Secondary | ICD-10-CM | POA: Diagnosis not present

## 2020-02-20 DIAGNOSIS — A499 Bacterial infection, unspecified: Secondary | ICD-10-CM | POA: Diagnosis not present

## 2020-02-20 DIAGNOSIS — D631 Anemia in chronic kidney disease: Secondary | ICD-10-CM | POA: Diagnosis not present

## 2020-02-20 DIAGNOSIS — N2581 Secondary hyperparathyroidism of renal origin: Secondary | ICD-10-CM | POA: Diagnosis not present

## 2020-02-20 DIAGNOSIS — Z992 Dependence on renal dialysis: Secondary | ICD-10-CM | POA: Diagnosis not present

## 2020-02-20 DIAGNOSIS — D509 Iron deficiency anemia, unspecified: Secondary | ICD-10-CM | POA: Diagnosis not present

## 2020-02-21 DIAGNOSIS — E1122 Type 2 diabetes mellitus with diabetic chronic kidney disease: Secondary | ICD-10-CM | POA: Diagnosis not present

## 2020-02-21 DIAGNOSIS — A419 Sepsis, unspecified organism: Secondary | ICD-10-CM | POA: Diagnosis not present

## 2020-02-21 DIAGNOSIS — I509 Heart failure, unspecified: Secondary | ICD-10-CM | POA: Diagnosis not present

## 2020-02-21 DIAGNOSIS — N186 End stage renal disease: Secondary | ICD-10-CM | POA: Diagnosis not present

## 2020-02-21 DIAGNOSIS — D631 Anemia in chronic kidney disease: Secondary | ICD-10-CM | POA: Diagnosis not present

## 2020-02-21 DIAGNOSIS — I132 Hypertensive heart and chronic kidney disease with heart failure and with stage 5 chronic kidney disease, or end stage renal disease: Secondary | ICD-10-CM | POA: Diagnosis not present

## 2020-02-22 DIAGNOSIS — D509 Iron deficiency anemia, unspecified: Secondary | ICD-10-CM | POA: Diagnosis not present

## 2020-02-22 DIAGNOSIS — N2581 Secondary hyperparathyroidism of renal origin: Secondary | ICD-10-CM | POA: Diagnosis not present

## 2020-02-22 DIAGNOSIS — N186 End stage renal disease: Secondary | ICD-10-CM | POA: Diagnosis not present

## 2020-02-22 DIAGNOSIS — Z992 Dependence on renal dialysis: Secondary | ICD-10-CM | POA: Diagnosis not present

## 2020-02-22 DIAGNOSIS — A499 Bacterial infection, unspecified: Secondary | ICD-10-CM | POA: Diagnosis not present

## 2020-02-22 DIAGNOSIS — D631 Anemia in chronic kidney disease: Secondary | ICD-10-CM | POA: Diagnosis not present

## 2020-02-25 DIAGNOSIS — N186 End stage renal disease: Secondary | ICD-10-CM | POA: Diagnosis not present

## 2020-02-25 DIAGNOSIS — A499 Bacterial infection, unspecified: Secondary | ICD-10-CM | POA: Insufficient documentation

## 2020-02-25 DIAGNOSIS — Z992 Dependence on renal dialysis: Secondary | ICD-10-CM | POA: Diagnosis not present

## 2020-02-25 DIAGNOSIS — D509 Iron deficiency anemia, unspecified: Secondary | ICD-10-CM | POA: Diagnosis not present

## 2020-02-25 DIAGNOSIS — D631 Anemia in chronic kidney disease: Secondary | ICD-10-CM | POA: Diagnosis not present

## 2020-02-25 DIAGNOSIS — N2581 Secondary hyperparathyroidism of renal origin: Secondary | ICD-10-CM | POA: Diagnosis not present

## 2020-02-26 DIAGNOSIS — Z8616 Personal history of COVID-19: Secondary | ICD-10-CM | POA: Diagnosis not present

## 2020-02-26 DIAGNOSIS — M109 Gout, unspecified: Secondary | ICD-10-CM | POA: Diagnosis not present

## 2020-02-26 DIAGNOSIS — N186 End stage renal disease: Secondary | ICD-10-CM | POA: Diagnosis not present

## 2020-02-26 DIAGNOSIS — E0842 Diabetes mellitus due to underlying condition with diabetic polyneuropathy: Secondary | ICD-10-CM | POA: Diagnosis not present

## 2020-02-26 DIAGNOSIS — I509 Heart failure, unspecified: Secondary | ICD-10-CM | POA: Diagnosis not present

## 2020-02-26 DIAGNOSIS — R918 Other nonspecific abnormal finding of lung field: Secondary | ICD-10-CM | POA: Diagnosis not present

## 2020-02-26 DIAGNOSIS — E782 Mixed hyperlipidemia: Secondary | ICD-10-CM | POA: Diagnosis not present

## 2020-02-26 DIAGNOSIS — L89319 Pressure ulcer of right buttock, unspecified stage: Secondary | ICD-10-CM | POA: Diagnosis not present

## 2020-02-26 DIAGNOSIS — I10 Essential (primary) hypertension: Secondary | ICD-10-CM | POA: Diagnosis not present

## 2020-02-26 DIAGNOSIS — G4730 Sleep apnea, unspecified: Secondary | ICD-10-CM | POA: Diagnosis not present

## 2020-02-26 DIAGNOSIS — E1122 Type 2 diabetes mellitus with diabetic chronic kidney disease: Secondary | ICD-10-CM | POA: Diagnosis not present

## 2020-02-27 ENCOUNTER — Other Ambulatory Visit: Payer: Self-pay

## 2020-02-27 DIAGNOSIS — Z992 Dependence on renal dialysis: Secondary | ICD-10-CM | POA: Diagnosis not present

## 2020-02-27 DIAGNOSIS — N2581 Secondary hyperparathyroidism of renal origin: Secondary | ICD-10-CM | POA: Diagnosis not present

## 2020-02-27 DIAGNOSIS — A499 Bacterial infection, unspecified: Secondary | ICD-10-CM | POA: Diagnosis not present

## 2020-02-27 DIAGNOSIS — D631 Anemia in chronic kidney disease: Secondary | ICD-10-CM | POA: Diagnosis not present

## 2020-02-27 DIAGNOSIS — D509 Iron deficiency anemia, unspecified: Secondary | ICD-10-CM | POA: Diagnosis not present

## 2020-02-27 DIAGNOSIS — N186 End stage renal disease: Secondary | ICD-10-CM | POA: Diagnosis not present

## 2020-02-28 ENCOUNTER — Ambulatory Visit (INDEPENDENT_AMBULATORY_CARE_PROVIDER_SITE_OTHER): Payer: Medicare Other | Admitting: Primary Care

## 2020-02-28 ENCOUNTER — Ambulatory Visit (INDEPENDENT_AMBULATORY_CARE_PROVIDER_SITE_OTHER): Payer: Medicare Other

## 2020-02-28 ENCOUNTER — Encounter: Payer: Self-pay | Admitting: Primary Care

## 2020-02-28 ENCOUNTER — Other Ambulatory Visit: Payer: Self-pay

## 2020-02-28 VITALS — BP 138/68 | HR 94 | Temp 97.9°F | Ht 73.0 in | Wt 290.4 lb

## 2020-02-28 DIAGNOSIS — U071 COVID-19: Secondary | ICD-10-CM | POA: Diagnosis not present

## 2020-02-28 DIAGNOSIS — J1282 Pneumonia due to coronavirus disease 2019: Secondary | ICD-10-CM

## 2020-02-28 DIAGNOSIS — J189 Pneumonia, unspecified organism: Secondary | ICD-10-CM | POA: Diagnosis not present

## 2020-02-28 NOTE — Patient Instructions (Addendum)
  Orders: CXR today re: covid-19  Follow-up: As needed if you develop worsening shortness of breath or cough

## 2020-02-28 NOTE — Progress Notes (Signed)
@Patient  ID: [name], male    DOB: 1956-04-09, 64 y.o.   MRN: [id number]  Chief Complaint  Patient presents with  . Follow-up    HFU-1 mth. ago,sob-better,no cough or wheeze    Referring provider: [name], MD  HPI: 64 year old male, former smoker quit in 2011 (10-pack-year history).  Past medical history significant for pneumonia due to COVID-19, respiratory failure with hypoxemia, sleep apnea, hypertension, chronic hepatitis C, diabetes mellitus type 2, chronic kidney disease end-stage, alcohol abuse, hyperlipidemia.  Patient recently admitted in March for sepsis due to Covid-19 pneumonia and streptococcus bacteremia.   Hospital Admissions: 01/11/20-3/01/21/20- Resp failure positive d/t Covid 19. Tested positive for covid 2/23. CT chest showed right > left upper lobe ground glass opacity most consistent with pneumonia. Treated for CAP. Received Vancomycin, Cefepime, Remdesivir. BC positive. Old dialysis cath removed and replaced. Repeat blood negative. Discharged on opral Augmentin x 7 days. Finished oral abx on 3/13. 01/22/2020-01/25/2020: Hypotension, sepsis (covid-19/ streptococcus bacteremia). Received IVF. Started on Rocephin and Flagyl. Discontinued Coreg and adjusted midodrine  02/28/2020 Patient presents today for hospital follow-up for Covid-19 pneumonia. He feels well. SBP at home have been between 130-140. He saw his PCP two days ago, no changes. He will need repeat CXR today to follow-up on RUL opacity. He is not experiencing any shortness of breath or cough. She reports some fatigue with long distances. He has lost some weight loss d/t changes he has made in his diet.   Imaging: 01/13/20 CT chest- Right greater than left posterior upper lobe airspace and ground-glass opacity. Most consistent with pneumonia. This is not atypical appearance of COVID-19 pneumonia, which cannot be excluded. Other infectious etiologies should also be considered  Allergies  Allergen  Reactions  . Penicillins Nausea And Vomiting and Other (See Comments)    Tolerated Augmentin. Has patient had a PCN reaction causing immediate rash, facial/tongue/throat swelling, SOB or lightheadedness with hypotension: No Has patient had a PCN reaction causing severe rash involving mucus membranes or skin necrosis: No Has patient had a PCN reaction that required hospitalization No Has patient had a PCN reaction occurring within the last 10 years: No If all of the above answers are "NO", then may proceed with Cephalosporin use.  Marland Kitchen Propolis Nausea And Vomiting  . Vioxx [Rofecoxib] Nausea And Vomiting    Immunization History  Administered Date(s) Administered  . Hepatitis B, adult 01/30/2014, 05/27/2014, 07/26/2014, 12/15/2015, 12/19/2019  . Influenza Split 12/16/2013  . Influenza, Seasonal, Injecte, Preservative Fre 08/15/2016  . Influenza,inj,Quad PF,6+ Mos 08/13/2019  . Influenza,inj,quad, With Preservative 08/09/2018  . Pneumococcal Conjugate-13 01/22/2015  . Pneumococcal Polysaccharide-23 01/05/2014, 02/14/2019    Past Medical History:  Diagnosis Date  . Alcohol abuse   . Congestive heart disease ([place]) 12/2011  . Diabetes mellitus   . GERD (gastroesophageal reflux disease)   . Gout   . Headache   . Hepatitis    being treated for Hepatitis C  . HOH (hard of hearing)   . Hyperlipidemia   . Hyperlipidemia   . Hypertension   . Monoclonal gammopathies 02/25/2012  . Obesity   . Pedal edema   . Pneumonia   . Renal insufficiency   . Sleep apnea    uses cpap  . Tobacco abuse   . Wears hearing aid    b/l    Tobacco History: Social History   Tobacco Use  Smoking Status Former Smoker  . Packs/day: 0.30  . Years: 33.00  . Pack years: 9.90  .  Types: Cigarettes  . Quit date: 07/16/2010  . Years since quitting: 9.6  Smokeless Tobacco Never Used   Counseling given: Not Answered   Outpatient Medications Prior to Visit  Medication Sig Dispense Refill  . acetaminophen  (TYLENOL) 325 MG tablet Take 650 mg by mouth every 6 (six) hours as needed for mild pain, moderate pain, fever or headache.     . allopurinol (ZYLOPRIM) 100 MG tablet Take 1 tablet (100 mg total) by mouth daily. 30 tablet 2  . AURYXIA 1 GM 210 MG(Fe) tablet Take 420 mg by mouth 2 (two) times daily with a meal.   12  . cinacalcet (SENSIPAR) 90 MG tablet Take 90 mg by mouth every Monday, Wednesday, and Friday.     . diphenhydrAMINE (BENADRYL) 25 MG tablet Take 25 mg by mouth every Monday, Wednesday, and Friday.    . Insulin Detemir (LEVEMIR FLEXTOUCH) 100 UNIT/ML Pen Inject 20 Units into the skin at bedtime.     . midodrine (PROAMATINE) 5 MG tablet Take 1 tablet (5 mg total) by mouth 2 (two) times daily with a meal. 60 tablet 0  . pravastatin (PRAVACHOL) 20 MG tablet Take 20 mg by mouth daily.     . mirtazapine (REMERON) 15 MG tablet Take 15 mg by mouth at bedtime as needed (sleep).    Marland Kitchen omeprazole (PRILOSEC) 20 MG capsule Take 20 mg by mouth daily.     . ondansetron (ZOFRAN) 4 MG tablet Take 4 mg by mouth 3 (three) times daily as needed for nausea/vomiting.     No facility-administered medications prior to visit.    Review of Systems  Review of Systems  Constitutional: Positive for fatigue.  Respiratory: Negative for cough, shortness of breath and wheezing.   Cardiovascular: Negative.   Psychiatric/Behavioral: Negative.    Physical Exam  BP 138/68 (BP Location: Left Arm)   Pulse 94   Temp 97.9 F (36.6 C) (Temporal)   Ht 6\' 1"  (1.854 m)   Wt 290 lb 6.4 oz (131.7 kg)   SpO2 98%   BMI 38.31 kg/m  Physical Exam Constitutional:      Appearance: Normal appearance. He is obese. He is not ill-appearing.  HENT:     Head: Normocephalic and atraumatic.     Mouth/Throat:     Mouth: Mucous membranes are moist.     Pharynx: Oropharynx is clear.     Comments: Mallampati class I-II Cardiovascular:     Rate and Rhythm: Normal rate and regular rhythm.  Pulmonary:     Effort: Pulmonary  effort is normal.     Breath sounds: Normal breath sounds. No wheezing.  Musculoskeletal:        General: Normal range of motion.  Neurological:     General: No focal deficit present.     Mental Status: He is alert and oriented to person, place, and time.  Psychiatric:        Mood and Affect: Mood normal.        Behavior: Behavior normal.        Thought Content: Thought content normal.        Judgment: Judgment normal.      Lab Results:  CBC    Component Value Date/Time   WBC 10.6 (H) 01/25/2020 0233   RBC 3.45 (L) 01/25/2020 0233   HGB 8.1 (L) 01/25/2020 0233   HGB 13.5 03/02/2012 1051   HCT 27.3 (L) 01/25/2020 0233   HCT 43.2 03/02/2012 1051   PLT 156 01/25/2020 0233  PLT 206 03/02/2012 1051   MCV 79.1 (L) 01/25/2020 0233   MCV 74.9 (L) 03/02/2012 1051   MCH 23.5 (L) 01/25/2020 0233   MCHC 29.7 (L) 01/25/2020 0233   RDW 20.2 (H) 01/25/2020 0233   RDW 13.7 03/02/2012 1051   LYMPHSABS 1.7 01/22/2020 1515   LYMPHSABS 1.5 03/02/2012 1051   MONOABS 1.1 (H) 01/22/2020 1515   MONOABS 0.5 03/02/2012 1051   EOSABS 0.0 01/22/2020 1515   EOSABS 0.1 03/02/2012 1051   BASOSABS 0.0 01/22/2020 1515   BASOSABS 0.0 03/02/2012 1051    BMET    Component Value Date/Time   NA 141 01/25/2020 0233   K 3.2 (L) 01/25/2020 0233   CL 105 01/25/2020 0233   CO2 20 (L) 01/25/2020 0233   GLUCOSE 116 (H) 01/25/2020 0233   BUN 48 (H) 01/25/2020 0233   CREATININE 13.82 (H) 01/25/2020 0233   CREATININE 16.29 (H) 11/17/2016 0908   CALCIUM 8.5 (L) 01/25/2020 0233   GFRNONAA 3 (L) 01/25/2020 0233   GFRNONAA 3 (L) 11/17/2016 0908   GFRAA 4 (L) 01/25/2020 0233   GFRAA <4 (L) 11/17/2016 0908    BNP    Component Value Date/Time   BNP 28.8 01/20/2020 0309    ProBNP    Component Value Date/Time   PROBNP 2,553.0 (H) 01/04/2014 1649    Imaging: No results found.   Assessment & Plan:   Pneumonia due to COVID-31 virus 64 year old male, former 10 pack year smoker.  Patient tested  positive for Covid in February 23rd.  CT chest showed right upper lobe groundglass opacity most consistent with pneumonia treated for CAP.  Cultures positive for Streptococcus bacteremia.  Old dialysis catheter removed and replaced.  He received a course of IV antibiotics in 1 days of year.  Discharged on oral Augmentin for 7 days.  He was readmitted a day later with hypotension and sepsis.  Received IV fluids.  Today he appears well and clinically stable.  He has no residual shortness of breath or cough.  We plan to check a chest x-ray to follow-up on right upper lobe opacity.  May need to reconsider CT chest or bronchoscopy if no improvement.  He will follow-up with our office as needed if chest x-ray is clear.  Plan: Clinically stable, no shortness of breath or cough Repeat chest x-ray today Follow-up as needed   Martyn Ehrich, NP 02/28/2020

## 2020-02-28 NOTE — Assessment & Plan Note (Addendum)
64 year old male, former 10 pack year smoker.  Patient tested positive for Covid in February 23rd.  CT chest showed right upper lobe groundglass opacity most consistent with pneumonia treated for CAP.  Cultures positive for Streptococcus bacteremia.  Old dialysis catheter removed and replaced.  He received a course of IV antibiotics in 1 days of year.  Discharged on oral Augmentin for 7 days.  He was readmitted a day later with hypotension and sepsis.  Received IV fluids.  Today he appears well and clinically stable.  He has no residual shortness of breath or cough.  We plan to check a chest x-ray to follow-up on right upper lobe opacity.  May need to reconsider CT chest or bronchoscopy if no improvement.  He will follow-up with our office as needed if chest x-ray is clear.  Plan: Clinically stable, no shortness of breath or cough Repeat chest x-ray today Follow-up as needed

## 2020-02-29 ENCOUNTER — Telehealth: Payer: Self-pay | Admitting: Primary Care

## 2020-02-29 DIAGNOSIS — D509 Iron deficiency anemia, unspecified: Secondary | ICD-10-CM | POA: Diagnosis not present

## 2020-02-29 DIAGNOSIS — Z992 Dependence on renal dialysis: Secondary | ICD-10-CM | POA: Diagnosis not present

## 2020-02-29 DIAGNOSIS — N186 End stage renal disease: Secondary | ICD-10-CM | POA: Diagnosis not present

## 2020-02-29 DIAGNOSIS — D631 Anemia in chronic kidney disease: Secondary | ICD-10-CM | POA: Diagnosis not present

## 2020-02-29 DIAGNOSIS — A499 Bacterial infection, unspecified: Secondary | ICD-10-CM | POA: Diagnosis not present

## 2020-02-29 DIAGNOSIS — N2581 Secondary hyperparathyroidism of renal origin: Secondary | ICD-10-CM | POA: Diagnosis not present

## 2020-02-29 NOTE — Progress Notes (Signed)
You saw this patient in-hospital for covid-19, treated for CAP. CXR showed minimal residual density RUL, favoring scar. Do you want me to get CT or are we ok?

## 2020-02-29 NOTE — Progress Notes (Signed)
Please let patient know CXR showed minimal residual density, favoring scar. Follow-up as needed with [place]

## 2020-02-29 NOTE — Telephone Encounter (Signed)
Spoke with pt, advised him of results. Nothing further is needed.

## 2020-03-02 DIAGNOSIS — U071 COVID-19: Secondary | ICD-10-CM | POA: Diagnosis not present

## 2020-03-02 DIAGNOSIS — J1282 Pneumonia due to coronavirus disease 2019: Secondary | ICD-10-CM | POA: Diagnosis not present

## 2020-03-02 DIAGNOSIS — Z992 Dependence on renal dialysis: Secondary | ICD-10-CM | POA: Diagnosis not present

## 2020-03-02 DIAGNOSIS — N186 End stage renal disease: Secondary | ICD-10-CM | POA: Diagnosis not present

## 2020-03-03 DIAGNOSIS — N186 End stage renal disease: Secondary | ICD-10-CM | POA: Diagnosis not present

## 2020-03-03 DIAGNOSIS — D509 Iron deficiency anemia, unspecified: Secondary | ICD-10-CM | POA: Diagnosis not present

## 2020-03-03 DIAGNOSIS — N2581 Secondary hyperparathyroidism of renal origin: Secondary | ICD-10-CM | POA: Diagnosis not present

## 2020-03-03 DIAGNOSIS — A499 Bacterial infection, unspecified: Secondary | ICD-10-CM | POA: Diagnosis not present

## 2020-03-03 DIAGNOSIS — D631 Anemia in chronic kidney disease: Secondary | ICD-10-CM | POA: Diagnosis not present

## 2020-03-03 DIAGNOSIS — Z992 Dependence on renal dialysis: Secondary | ICD-10-CM | POA: Diagnosis not present

## 2020-03-04 ENCOUNTER — Inpatient Hospital Stay (HOSPITAL_COMMUNITY): Admission: RE | Admit: 2020-03-04 | Payer: Medicare Other | Source: Ambulatory Visit

## 2020-03-04 NOTE — Progress Notes (Signed)
Patient was positive for COVID on 01/11/20, according to policy the patient does not need to be retested for up to 90 days after the positive result before the procedure on 03/06/20    Order: [id number] Status:  Final result   Visible to patient:  No (inaccessible in [place]) Next appt:  03/19/2020 at 03:00 PM in Cardiology Quay Burow, MD) Specimen Information: Nasopharyngeal Swab       Ref Range & Units 1 mo ago 3 yr ago  SARS Coronavirus 2 by RT PCR NEGATIVE POSITIVEAbnormal     Comment: RESULT CALLED TO, READ BACK BY AND VERIFIED WITH:  Kathryne Sharper RN [id number] 01/11/20 JM  (NOTE)  SARS-CoV-2 target nucleic acids are DETECTED.  SARS-CoV-2 RNA is generally detectable in upper respiratory specimens  during the acute phase of infection. Positive results are indicative  of the presence of the identified virus, but do not rule out  bacterial infection or co-infection with other pathogens not  detected by the test. Clinical correlation with patient history and  other diagnostic information is necessary to determine patient  infection status. The expected result is Negative.  Fact Sheet for Patients:  PinkCheek.be  Fact Sheet for Healthcare Providers:  GravelBags.it  This test is not yet approved or cleared by the [place] FDA and  has been authorized for detection and/or diagnosis of SARS-CoV-2 by  FDA under an Emergency Use Authorization (EUA).  This EUA will  remain in effect (meaning this test can be used) for the duration of  the COVID-19 declaration under Section 564(b)(1) of the Act, 21  U.S.C. section 360bbb-3(b)(1), unless the authorization is  terminated or revoked sooner.   Influenza A by PCR NEGATIVE NEGATIVE  NEGATIVE   Influenza B by PCR NEGATIVE NEGATIVE  NEGATIVE CM   Comment: (NOTE)  The Xpert Xpress SARS-CoV-2/FLU/RSV assay is intended as an aid in  the diagnosis of influenza from Nasopharyngeal swab specimens  and  should not be used as a sole basis for treatment. Nasal washings and  aspirates are unacceptable for Xpert Xpress SARS-CoV-2/FLU/RSV  testing.  Fact Sheet for Patients:  PinkCheek.be  Fact Sheet for Healthcare Providers:  GravelBags.it  This test is not yet approved or cleared by the [place] FDA and  has been authorized for detection and/or diagnosis of SARS-CoV-2 by  FDA under an Emergency Use Authorization (EUA). This EUA will remain  in effect (meaning this test can be used) for the duration of the  Covid-19 declaration under Section 564(b)(1) of the Act, 21  U.S.C. section 360bbb-3(b)(1), unless the authorization is  terminated or revoked.  Performed at [facility], [place] [address],  [place], [place] [address]

## 2020-03-05 ENCOUNTER — Other Ambulatory Visit: Payer: Self-pay | Admitting: Internal Medicine

## 2020-03-05 ENCOUNTER — Other Ambulatory Visit: Payer: Self-pay

## 2020-03-05 ENCOUNTER — Encounter (HOSPITAL_COMMUNITY): Payer: Self-pay | Admitting: Vascular Surgery

## 2020-03-05 DIAGNOSIS — E119 Type 2 diabetes mellitus without complications: Secondary | ICD-10-CM | POA: Diagnosis not present

## 2020-03-05 DIAGNOSIS — N2581 Secondary hyperparathyroidism of renal origin: Secondary | ICD-10-CM | POA: Diagnosis not present

## 2020-03-05 DIAGNOSIS — D631 Anemia in chronic kidney disease: Secondary | ICD-10-CM | POA: Diagnosis not present

## 2020-03-05 DIAGNOSIS — D509 Iron deficiency anemia, unspecified: Secondary | ICD-10-CM | POA: Diagnosis not present

## 2020-03-05 DIAGNOSIS — N186 End stage renal disease: Secondary | ICD-10-CM | POA: Diagnosis not present

## 2020-03-05 DIAGNOSIS — Z992 Dependence on renal dialysis: Secondary | ICD-10-CM | POA: Diagnosis not present

## 2020-03-05 DIAGNOSIS — A499 Bacterial infection, unspecified: Secondary | ICD-10-CM | POA: Diagnosis not present

## 2020-03-05 MED ORDER — VANCOMYCIN HCL 1500 MG/300ML IV SOLN
1500.0000 mg | INTRAVENOUS | Status: AC
  Administered 2020-03-06: 1500 mg via INTRAVENOUS
  Filled 2020-03-05 (×2): qty 300

## 2020-03-05 NOTE — Anesthesia Preprocedure Evaluation (Addendum)
Anesthesia Evaluation  Patient identified by MRN, date of birth, ID band Patient awake    Reviewed: Allergy & Precautions, NPO status , Patient's Chart, lab work & pertinent test results  Airway Mallampati: III  TM Distance: >3 FB Neck ROM: Full    Dental  (+) Missing   Pulmonary sleep apnea , former smoker,    Pulmonary exam normal breath sounds clear to auscultation       Cardiovascular hypertension, Pt. on medications +CHF  Normal cardiovascular exam Rhythm:Regular Rate:Normal  ECG: SR, rate 94   Neuro/Psych  Headaches, PSYCHIATRIC DISORDERS    GI/Hepatic GERD  Medicated and Controlled,(+)     substance abuse  , Hepatitis -, C  Endo/Other  diabetes, Insulin Dependent  Renal/GU ESRF and DialysisRenal diseaseOn HD M, W, F     Musculoskeletal Gout   Abdominal (+) + obese,   Peds  Hematology HLD   Anesthesia Other Findings END STAGE RENAL DISEASE  Reproductive/Obstetrics                           Anesthesia Physical Anesthesia Plan  ASA: IV  Anesthesia Plan: General   Post-op Pain Management:    Induction: Intravenous  PONV Risk Score and Plan: 2 and Ondansetron, Dexamethasone and Treatment may vary due to age or medical condition  Airway Management Planned: Oral ETT  Additional Equipment:   Intra-op Plan:   Post-operative Plan: Extubation in OR  Informed Consent: I have reviewed the patients History and Physical, chart, labs and discussed the procedure including the risks, benefits and alternatives for the proposed anesthesia with the patient or authorized representative who has indicated his/her understanding and acceptance.     Dental advisory given  Plan Discussed with: CRNA  Anesthesia Plan Comments: (Per PA-C:  Pt has had two recent admissions related to Covid PNA.  01/11/20-3/01/21/20- Resp failure positived/tCovid 19.Tested positive for covid 2/23.CT chest  showed right > left upper lobe ground glass opacity most consistent with pneumonia. Treated for CAP. Received Vancomycin, Cefepime, Remdesivir. BC positive.Old dialysis cath removedand replaced. Repeat blood negative.Discharged on opral Augmentin x 7 days.Finished oral abx on 3/13. 01/22/2020-01/25/2020:Hypotension, sepsis(covid-19/ streptococcus bacteremia). Received IVF. Started on Rocephin and Flagyl.Discontinued Coregand adjusted midodrine  Pt had outpatient followup with pulmonology 02/28/20 and was doing well at that time, clinically stable, no shortness of breath. Repeat CXR showed Minimal residual density in the right upper lobe, favor residual Scar. He was advised to followup PRN.   ESRD on HD via [place].  IDDMII, last A1c in Epic 10.5 on 08/08/18.  Will need DOS labs and eval.   EKG 01/22/20: Sinus rhythm. Rate 94. Low voltage, extremity and precordial leads  TTE 03/07/17: - Left ventricle: The cavity size was mildly dilated. There was  mild concentric hypertrophy. Systolic function was normal. The  estimated ejection fraction was in the range of 55% to 60%.  Features are consistent with a pseudonormal left ventricular  filling pattern, with concomitant abnormal relaxation and  increased filling pressure (grade 2 diastolic dysfunction).  - Left atrium: The atrium was mildly dilated.   Impressions:   - Poor technical quality study. Limited echo windows and off axis  imaging, poorly aligned Doppler signals. )       Anesthesia Quick Evaluation

## 2020-03-05 NOTE — Progress Notes (Signed)
Patient denies shortness of breath, fever, cough and chest pain.  PCP - Dr [name] Cardiologist - n/a Pulmonology - Geraldo Pitter, NP  Chest x-ray - 02/28/20 (2V) EKG - 01/23/20 Stress Test - n/a ECHO - 03/07/17 Cardiac Cath - n/a  Sleep Study - Yes CPAP -  Does not use CPAP  . THE MORNING OF SURGERY, take 10 units of Levemir insulin.  . If your blood sugar is less than 70 mg/dL, you will need to treat for low blood sugar: o Treat a low blood sugar (less than 70 mg/dL) with  cup of clear juice (cranberry or apple), 4 glucose tablets, OR glucose gel. o Recheck blood sugar in 15 minutes after treatment (to make sure it is greater than 70 mg/dL). If your blood sugar is not greater than 70 mg/dL on recheck, call [phone] for further instructions.  Anesthesia review: Yes  STOP now taking any Aspirin (unless otherwise instructed by your surgeon), Aleve, Naproxen, Ibuprofen, Motrin, Advil, Goody's, BC's, all herbal medications, fish oil, and all vitamins.   Coronavirus Screening Positive covid test on 01/11/20.  Does not ned to be retested.  WIthin 90 days of surgery on 03/06/20. Are you currently having the following symptoms:  Cough yes/no: No Fever (>100.45F)  yes/no: No Runny nose yes/no: No Sore throat yes/no: No Difficulty breathing/shortness of breath  yes/no: No  Have you traveled in the last 14 days and where? yes/no: No  Patient verbalized understanding of instructions that were given via phone.

## 2020-03-05 NOTE — Progress Notes (Addendum)
Anesthesia Chart Review: Same day workup.  Pt has had two recent admissions related to Covid PNA.  01/11/20-3/01/21/20- Resp failure positive d/t Covid 19. Tested positive for covid 2/23. CT chest showed right > left upper lobe ground glass opacity most consistent with pneumonia. Treated for CAP. Received Vancomycin, Cefepime, Remdesivir. BC positive. Old dialysis cath removed and replaced. Repeat blood negative. Discharged on opral Augmentin x 7 days. Finished oral abx on 3/13. 01/22/2020-01/25/2020: Hypotension, sepsis (covid-19/ streptococcus bacteremia). Received IVF. Started on Rocephin and Flagyl. Discontinued Coreg and adjusted midodrine  Pt had outpatient followup with pulmonology 02/28/20 and was doing well at that time, clinically stable, no shortness of breath. Repeat CXR showed Minimal residual density in the right upper lobe, favor residual Scar. He was advised to followup PRN.   ESRD on HD via [place].  IDDMII, last A1c in Epic 10.5 on 08/08/18.  Will need DOS labs and eval.   EKG 01/22/20: Sinus rhythm. Rate 94. Low voltage, extremity and precordial leads  TTE 03/07/17: - Left ventricle: The cavity size was mildly dilated. There was  mild concentric hypertrophy. Systolic function was normal. The  estimated ejection fraction was in the range of 55% to 60%.  Features are consistent with a pseudonormal left ventricular  filling pattern, with concomitant abnormal relaxation and  increased filling pressure (grade 2 diastolic dysfunction).  - Left atrium: The atrium was mildly dilated.   Impressions:   - Poor technical quality study. Limited echo windows and off axis  imaging, poorly aligned Doppler signals.    Wynonia Musty [facility] Short Stay Center/Anesthesiology Phone [phone] 03/05/2020 10:10 AM

## 2020-03-06 ENCOUNTER — Ambulatory Visit (HOSPITAL_COMMUNITY): Payer: Medicare Other | Admitting: Physician Assistant

## 2020-03-06 ENCOUNTER — Encounter (HOSPITAL_COMMUNITY): Payer: Self-pay | Admitting: Vascular Surgery

## 2020-03-06 ENCOUNTER — Other Ambulatory Visit: Payer: Self-pay

## 2020-03-06 ENCOUNTER — Encounter (HOSPITAL_COMMUNITY)
Admission: RE | Disposition: A | Discharge status: Home / Self Care | Payer: Self-pay | Source: Home / Self Care | Attending: Vascular Surgery

## 2020-03-06 ENCOUNTER — Ambulatory Visit (HOSPITAL_COMMUNITY)
Admission: RE | Admit: 2020-03-06 | Discharge: 2020-03-06 | Disposition: A | Discharge status: Home / Self Care | Payer: Medicare Other | Attending: Vascular Surgery | Admitting: Vascular Surgery

## 2020-03-06 DIAGNOSIS — Z992 Dependence on renal dialysis: Secondary | ICD-10-CM | POA: Insufficient documentation

## 2020-03-06 DIAGNOSIS — I509 Heart failure, unspecified: Secondary | ICD-10-CM | POA: Diagnosis not present

## 2020-03-06 DIAGNOSIS — Z6836 Body mass index (BMI) 36.0-36.9, adult: Secondary | ICD-10-CM | POA: Insufficient documentation

## 2020-03-06 DIAGNOSIS — E785 Hyperlipidemia, unspecified: Secondary | ICD-10-CM | POA: Diagnosis not present

## 2020-03-06 DIAGNOSIS — E1122 Type 2 diabetes mellitus with diabetic chronic kidney disease: Secondary | ICD-10-CM | POA: Insufficient documentation

## 2020-03-06 DIAGNOSIS — G4730 Sleep apnea, unspecified: Secondary | ICD-10-CM | POA: Diagnosis not present

## 2020-03-06 DIAGNOSIS — Z794 Long term (current) use of insulin: Secondary | ICD-10-CM | POA: Insufficient documentation

## 2020-03-06 DIAGNOSIS — Z833 Family history of diabetes mellitus: Secondary | ICD-10-CM | POA: Diagnosis not present

## 2020-03-06 DIAGNOSIS — N186 End stage renal disease: Secondary | ICD-10-CM | POA: Diagnosis not present

## 2020-03-06 DIAGNOSIS — Z880 Allergy status to penicillin: Secondary | ICD-10-CM | POA: Diagnosis not present

## 2020-03-06 DIAGNOSIS — K219 Gastro-esophageal reflux disease without esophagitis: Secondary | ICD-10-CM | POA: Insufficient documentation

## 2020-03-06 DIAGNOSIS — Z8249 Family history of ischemic heart disease and other diseases of the circulatory system: Secondary | ICD-10-CM | POA: Insufficient documentation

## 2020-03-06 DIAGNOSIS — E669 Obesity, unspecified: Secondary | ICD-10-CM | POA: Insufficient documentation

## 2020-03-06 DIAGNOSIS — Z79899 Other long term (current) drug therapy: Secondary | ICD-10-CM | POA: Insufficient documentation

## 2020-03-06 DIAGNOSIS — N185 Chronic kidney disease, stage 5: Secondary | ICD-10-CM

## 2020-03-06 DIAGNOSIS — M109 Gout, unspecified: Secondary | ICD-10-CM | POA: Insufficient documentation

## 2020-03-06 DIAGNOSIS — I132 Hypertensive heart and chronic kidney disease with heart failure and with stage 5 chronic kidney disease, or end stage renal disease: Secondary | ICD-10-CM | POA: Insufficient documentation

## 2020-03-06 DIAGNOSIS — Z87891 Personal history of nicotine dependence: Secondary | ICD-10-CM | POA: Diagnosis not present

## 2020-03-06 DIAGNOSIS — Z905 Acquired absence of kidney: Secondary | ICD-10-CM | POA: Diagnosis not present

## 2020-03-06 HISTORY — PX: BASCILIC VEIN TRANSPOSITION: SHX5742

## 2020-03-06 LAB — POCT I-STAT, CHEM 8
BUN: 28 mg/dL — ABNORMAL HIGH (ref 8–23)
Calcium, Ion: 0.94 mmol/L — ABNORMAL LOW (ref 1.15–1.40)
Chloride: 97 mmol/L — ABNORMAL LOW (ref 98–111)
Creatinine, Ser: 10 mg/dL — ABNORMAL HIGH (ref 0.61–1.24)
Glucose, Bld: 97 mg/dL (ref 70–99)
HCT: 44 % (ref 39.0–52.0)
Hemoglobin: 15 g/dL (ref 13.0–17.0)
Potassium: 4.5 mmol/L (ref 3.5–5.1)
Sodium: 136 mmol/L (ref 135–145)
TCO2: 27 mmol/L (ref 22–32)

## 2020-03-06 LAB — GLUCOSE, CAPILLARY
Glucose-Capillary: 99 mg/dL (ref 70–99)
Glucose-Capillary: 87 mg/dL (ref 70–99)
Glucose-Capillary: 80 mg/dL (ref 70–99)
Glucose-Capillary: 93 mg/dL (ref 70–99)

## 2020-03-06 SURGERY — TRANSPOSITION, VEIN, BASILIC
Anesthesia: General | Site: Arm Upper | Laterality: Right

## 2020-03-06 MED ORDER — CHLORHEXIDINE GLUCONATE 4 % EX LIQD: 60.0000 mL | Freq: Once | CUTANEOUS | Status: DC

## 2020-03-06 MED ORDER — SODIUM CHLORIDE 0.9 % IV SOLN
INTRAVENOUS | Status: AC
  Filled 2020-03-06: qty 1.2

## 2020-03-06 MED ORDER — PHENYLEPHRINE HCL-NACL 10-0.9 MG/250ML-% IV SOLN
INTRAVENOUS | Status: DC | PRN
  Administered 2020-03-06: 40 ug/min via INTRAVENOUS

## 2020-03-06 MED ORDER — FENTANYL CITRATE (PF) 250 MCG/5ML IJ SOLN
INTRAMUSCULAR | Status: AC
  Filled 2020-03-06: qty 5

## 2020-03-06 MED ORDER — ACETAMINOPHEN 10 MG/ML IV SOLN
INTRAVENOUS | Status: AC
  Filled 2020-03-06: qty 100

## 2020-03-06 MED ORDER — SODIUM CHLORIDE 0.9 % IV SOLN: INTRAVENOUS | Status: DC

## 2020-03-06 MED ORDER — SUCCINYLCHOLINE CHLORIDE 20 MG/ML IJ SOLN
INTRAMUSCULAR | Status: DC | PRN
  Administered 2020-03-06: 120 mg via INTRAVENOUS

## 2020-03-06 MED ORDER — FENTANYL CITRATE (PF) 100 MCG/2ML IJ SOLN
INTRAMUSCULAR | Status: DC | PRN
  Administered 2020-03-06: 25 ug via INTRAVENOUS
  Administered 2020-03-06 (×3): 50 ug via INTRAVENOUS
  Administered 2020-03-06: 25 ug via INTRAVENOUS
  Administered 2020-03-06: 50 ug via INTRAVENOUS

## 2020-03-06 MED ORDER — LIDOCAINE HCL (CARDIAC) PF 100 MG/5ML IV SOSY
PREFILLED_SYRINGE | INTRAVENOUS | Status: DC | PRN
  Administered 2020-03-06: 80 mg via INTRAVENOUS

## 2020-03-06 MED ORDER — PROPOFOL 10 MG/ML IV BOLUS
INTRAVENOUS | Status: DC | PRN
  Administered 2020-03-06: 120 mg via INTRAVENOUS

## 2020-03-06 MED ORDER — ONDANSETRON HCL 4 MG/2ML IJ SOLN
INTRAMUSCULAR | Status: DC | PRN
  Administered 2020-03-06: 4 mg via INTRAVENOUS

## 2020-03-06 MED ORDER — LIDOCAINE-EPINEPHRINE 0.5 %-1:200000 IJ SOLN
INTRAMUSCULAR | Status: AC
  Filled 2020-03-06: qty 1

## 2020-03-06 MED ORDER — ONDANSETRON HCL 4 MG/2ML IJ SOLN
INTRAMUSCULAR | Status: AC
  Filled 2020-03-06: qty 2

## 2020-03-06 MED ORDER — OXYCODONE-ACETAMINOPHEN 7.5-325 MG PO TABS: 1.0000 | ORAL_TABLET | ORAL | 0 refills | Status: DC | PRN

## 2020-03-06 MED ORDER — ACETAMINOPHEN 10 MG/ML IV SOLN
1000.0000 mg | Freq: Once | INTRAVENOUS | Status: DC | PRN
  Administered 2020-03-06: 1000 mg via INTRAVENOUS

## 2020-03-06 MED ORDER — LIDOCAINE 2% (20 MG/ML) 5 ML SYRINGE
INTRAMUSCULAR | Status: AC
  Filled 2020-03-06: qty 5

## 2020-03-06 MED ORDER — LIDOCAINE HCL (PF) 0.5 % IJ SOLN
INTRAMUSCULAR | Status: AC
  Filled 2020-03-06: qty 50

## 2020-03-06 MED ORDER — 0.9 % SODIUM CHLORIDE (POUR BTL) OPTIME
TOPICAL | Status: DC | PRN
  Administered 2020-03-06: 1000 mL

## 2020-03-06 MED ORDER — SODIUM CHLORIDE 0.9 % IV SOLN
INTRAVENOUS | Status: DC | PRN
  Administered 2020-03-06: 500 mL

## 2020-03-06 MED ORDER — ONDANSETRON HCL 4 MG/2ML IJ SOLN: 4.0000 mg | Freq: Once | INTRAMUSCULAR | Status: DC | PRN

## 2020-03-06 MED ORDER — MIDAZOLAM HCL 2 MG/2ML IJ SOLN
INTRAMUSCULAR | Status: AC
  Filled 2020-03-06: qty 2

## 2020-03-06 MED ORDER — DEXAMETHASONE SODIUM PHOSPHATE 10 MG/ML IJ SOLN
INTRAMUSCULAR | Status: DC | PRN
  Administered 2020-03-06: 5 mg via INTRAVENOUS

## 2020-03-06 MED ORDER — FENTANYL CITRATE (PF) 100 MCG/2ML IJ SOLN
INTRAMUSCULAR | Status: AC
  Filled 2020-03-06: qty 2

## 2020-03-06 MED ORDER — FENTANYL CITRATE (PF) 100 MCG/2ML IJ SOLN
25.0000 ug | INTRAMUSCULAR | Status: DC | PRN
  Administered 2020-03-06: 50 ug via INTRAVENOUS
  Administered 2020-03-06: 25 ug via INTRAVENOUS

## 2020-03-06 MED ORDER — MIDAZOLAM HCL 5 MG/5ML IJ SOLN
INTRAMUSCULAR | Status: DC | PRN
  Administered 2020-03-06: 1 mg via INTRAVENOUS

## 2020-03-06 SURGICAL SUPPLY — 36 items
ARMBAND PINK RESTRICT EXTREMIT (MISCELLANEOUS) ×2 IMPLANT
CANISTER SUCT 3000ML PPV (MISCELLANEOUS) ×2 IMPLANT
CANNULA VESSEL 3MM 2 BLNT TIP (CANNULA) ×2 IMPLANT
CLIP LIGATING EXTRA MED SLVR (CLIP) ×2 IMPLANT
CLIP LIGATING EXTRA SM BLUE (MISCELLANEOUS) ×2 IMPLANT
COVER PROBE W GEL 5X96 (DRAPES) ×2 IMPLANT
COVER WAND RF STERILE (DRAPES) IMPLANT
DECANTER SPIKE VIAL GLASS SM (MISCELLANEOUS) ×2 IMPLANT
DERMABOND ADVANCED (GAUZE/BANDAGES/DRESSINGS) ×2
DERMABOND ADVANCED .7 DNX12 (GAUZE/BANDAGES/DRESSINGS) ×2 IMPLANT
ELECT REM PT RETURN 9FT ADLT (ELECTROSURGICAL) ×2
ELECTRODE REM PT RTRN 9FT ADLT (ELECTROSURGICAL) ×1 IMPLANT
GLOVE BIO SURGEON STRL SZ 6.5 (GLOVE) ×6 IMPLANT
GLOVE BIO SURGEON STRL SZ7 (GLOVE) ×2 IMPLANT
GLOVE BIOGEL PI IND STRL 6.5 (GLOVE) ×4 IMPLANT
GLOVE BIOGEL PI IND STRL 7.0 (GLOVE) ×1 IMPLANT
GLOVE BIOGEL PI INDICATOR 6.5 (GLOVE) ×4
GLOVE BIOGEL PI INDICATOR 7.0 (GLOVE) ×1
GLOVE ECLIPSE 6.5 STRL STRAW (GLOVE) ×2 IMPLANT
GLOVE SS BIOGEL STRL SZ 7.5 (GLOVE) ×1 IMPLANT
GLOVE SUPERSENSE BIOGEL SZ 7.5 (GLOVE) ×1
GOWN STRL REUS W/ TWL LRG LVL3 (GOWN DISPOSABLE) ×3 IMPLANT
GOWN STRL REUS W/TWL LRG LVL3 (GOWN DISPOSABLE) ×6
KIT BASIN OR (CUSTOM PROCEDURE TRAY) ×2 IMPLANT
KIT TURNOVER KIT B (KITS) ×2 IMPLANT
NS IRRIG 1000ML POUR BTL (IV SOLUTION) ×2 IMPLANT
PACK CV ACCESS (CUSTOM PROCEDURE TRAY) ×2 IMPLANT
PAD ARMBOARD 7.5X6 YLW CONV (MISCELLANEOUS) ×4 IMPLANT
SLEEVE SURGEON STRL (DRAPES) ×2 IMPLANT
SUT PROLENE 6 0 CC (SUTURE) ×2 IMPLANT
SUT SILK 2 0 SH (SUTURE) ×2 IMPLANT
SUT VIC AB 3-0 SH 27 (SUTURE) ×6
SUT VIC AB 3-0 SH 27X BRD (SUTURE) ×3 IMPLANT
TOWEL GREEN STERILE (TOWEL DISPOSABLE) ×2 IMPLANT
UNDERPAD 30X30 (UNDERPADS AND DIAPERS) ×2 IMPLANT
WATER STERILE IRR 1000ML POUR (IV SOLUTION) ×2 IMPLANT

## 2020-03-06 NOTE — Discharge Instructions (Signed)
[facility]  Discharge Instructions  AV Fistula or Graft Surgery for Dialysis Access  Please refer to the following instructions for your post-procedure care. Your surgeon or physician assistant will discuss any changes with you.  Activity  You may drive the day following your surgery, if you are comfortable and no longer taking prescription pain medication. Resume full activity as the soreness in your incision resolves.  Bathing/Showering  You may shower after you go home. Keep your incision dry for 48 hours. Do not soak in a bathtub, hot tub, or swim until the incision heals completely. You may not shower if you have a hemodialysis catheter.  Incision Care  Clean your incision with mild soap and water after 48 hours. Pat the area dry with a clean towel. You do not need a bandage unless otherwise instructed. Do not apply any ointments or creams to your incision. You may have skin glue on your incision. Do not peel it off. It will come off on its own in about one week. Your arm may swell a bit after surgery. To reduce swelling use pillows to elevate your arm so it is above your heart. Your doctor will tell you if you need to lightly wrap your arm with an ACE bandage.  Diet  Resume your normal diet. There are not special food restrictions following this procedure. In order to heal from your surgery, it is CRITICAL to get adequate nutrition. Your body requires vitamins, minerals, and protein. Vegetables are the best source of vitamins and minerals. Vegetables also provide the perfect balance of protein. Processed food has little nutritional value, so try to avoid this.  Medications  Resume taking all of your medications. If your incision is causing pain, you may take over-the counter pain relievers such as acetaminophen (Tylenol). If you were prescribed a stronger pain medication, please be aware these medications can cause nausea and constipation. Prevent  nausea by taking the medication with a snack or meal. Avoid constipation by drinking plenty of fluids and eating foods with high amount of fiber, such as fruits, vegetables, and grains.  Do not take Tylenol if you are taking prescription pain medications.  Follow up Your surgeon may want to see you in the office following your access surgery. If so, this will be arranged at the time of your surgery.  Please call us immediately for any of the following conditions:  . Increased pain, redness, drainage (pus) from your incision site . Fever of 101 degrees or higher . Severe or worsening pain at your incision site . Hand pain or numbness. .  Reduce your risk of vascular disease:  . Stop smoking. If you would like help, call [facility] at 1-800-QUIT-NOW (754)859-1944) or [place] at 250 038 7132  . Manage your cholesterol . Maintain a desired weight . Control your diabetes . Keep your blood pressure down  Dialysis  It will take several weeks to several months for your new dialysis access to be ready for use. Your surgeon will determine when it is okay to use it. Your nephrologist will continue to direct your dialysis. You can continue to use your Permcath until your new access is ready for use.   03/06/2020 KRISHAV MAMONE [id number] 1956-05-13  Surgeon(s): Early, Arvilla Meres, MD  Procedure(s): RIGHT ARM BASILIC VEIN TRANSPOSITION FISTULA   May stick graft immediately   May stick graft on designated area only:    Do not stick for 12 weeks    If you  have any questions, please call the office at [phone].

## 2020-03-06 NOTE — Op Note (Signed)
    OPERATIVE REPORT  DATE OF SURGERY: 03/06/2020  PATIENT: [name], 64 y.o. male MRN: [id number]  DOB: 12-18-1955  PRE-OPERATIVE DIAGNOSIS: End-stage renal disease  POST-OPERATIVE DIAGNOSIS:  Same  PROCEDURE: Right second stage basilic vein transposition fistula  SURGEON:  Curt Jews, M.D.  PHYSICIAN ASSISTANT: Paul Half [facility]  ANESTHESIA: General  EBL: per anesthesia record  Total I/O In: 500 [I.V.:500] Out: -   BLOOD ADMINISTERED: none  DRAINS: none  SPECIMEN: none  COUNTS CORRECT:  YES  PATIENT DISPOSITION:  PACU - hemodynamically stable  PROCEDURE DETAILS: The patient was taken the operating placed to position where the area of the right arm right axilla were prepped draped in sterile fashion.  SonoSite visualization was used to image the basilic vein.  Basilic vein was of good caliber.  Incision was made over the brachial artery.  The artery had moderate atherosclerotic change but was of good caliber with a normal pulse.  Separate incision was made over the basilic vein medial to the elbow and several additional incisions were made with skin bridges up to the axilla.  Tributary branches were ligated with 3-0 and 4-0 silk ties and divided.  The vein was ligated distally and was cannulated and gently dilated and was of excellent size.  A tunnel was created from the antecubital incision to the axillary incision and the vein was brought through the subcutaneous tunnel.  The brachial artery was occluded proximally and distally and was opened with 11 blade and sent longitudinally with Potts scissors.  The vein was cut to the appropriate length and was spatulated and sewn end-to-side to the artery with a running 6-0 Prolene suture.  Clamps removed and excellent thrill was noted.  Wounds irrigated with saline.  Hemostasis left cautery.  Wounds were closed with 3-0 Vicryl in the subcutaneous subcuticular tissue.  Sterile dressing was applied and the patient was transferred to  the recovery in stable condition   [name], M.D., [facility] 03/06/2020 2:44 PM

## 2020-03-06 NOTE — H&P (Signed)
Jaye Polidori, Arvilla Meres, MD    Trinita Devlin, Arvilla Meres, MD  Physician  Specialty:  Vascular Surgery     Progress Notes      Signed     Encounter Date:  12/25/2019                  Signed                    Expand widget buttonCollapse widget button    Show:Clear all   ManualTemplateCopied  Added by:     Rosetta Posner, MD   Hover for detailscustomization button                                                                                                                  untitled image      Vascular and Vein Specialist of [facility]     Patient name: [name]          MRN: [id number]        DOB: 1955-11-23            Sex: male     REASON FOR VISIT: Discuss new access for hemodialysis     HPI:  [name] is a 64 y.o. male here today for discussion of new access for hemodialysis.  He had been undergoing dialysis via a right radiocephalic fistula placed years ago.  He had undergone a revision in September 2020 and had replacement of a segment with Artegraft.  He underwent right nephrectomy and reports that his stool occluded during this time.  He is currently being dialyzed via a tunneled hemodialysis catheter.  He reports no infection with this but has had some difficulty with runs of this.  He is left-handed.          Past Medical History:    Diagnosis   Date    .   Alcohol abuse        .   Congestive heart disease ([place])   12/2011    .   Diabetes mellitus        .   GERD (gastroesophageal reflux disease)        .   Gout        .   Headache        .   Hepatitis            being treated for Hepatitis C    .   HOH (hard of hearing)        .   Hyperlipidemia        .   Hyperlipidemia        .   Hypertension        .   Monoclonal gammopathies    02/25/2012    .   Obesity        .   Pedal edema        .  Pneumonia        .   Renal insufficiency        .   Sleep apnea            uses cpap    .   Tobacco abuse        .   Wears hearing aid            b/l                Family History    Problem   Relation   Age of Onset    .   Diabetes   Mother        .   Heart disease   Mother        .   Lung cancer   Father              SOCIAL HISTORY:   Social History             Tobacco Use    .   Smoking status:   Former Smoker            Packs/day:   0.30            Years:   33.00            Pack years:   9.90            Types:   Cigarettes            Quit date:   07/16/2010            Years since quitting:   9.4    .   Smokeless tobacco:   Never Used    Substance Use Topics    .   Alcohol use:   No                Allergies    Allergen   Reactions    .   Penicillin G        .   Penicillins   Nausea And Vomiting and Other (See Comments)            Has patient had a PCN reaction causing immediate rash, facial/tongue/throat swelling, SOB or lightheadedness with hypotension: No  Has patient had a PCN reaction causing severe rash involving mucus membranes or skin necrosis: No  Has patient had a PCN reaction that required hospitalization No  Has patient had a PCN reaction occurring within the last 10 years: No  If all of the above answers are "NO", then may proceed with Cephalosporin use.    Marland Kitchen   Propolis   Nausea And Vomiting    .   Vioxx [Rofecoxib]   Nausea And Vomiting                 Current Outpatient Medications    Medication   Sig   Dispense   Refill    .   acetaminophen (TYLENOL) 325 MG tablet   Take 650 mg by mouth every 6 (six) hours as needed for mild pain, moderate pain, fever or headache.              .   allopurinol (ZYLOPRIM) 100 MG tablet   Take 1 tablet (100 mg total) by mouth daily. (Patient taking differently: Take 100 mg by mouth 2 (two) times daily. )   30 tablet   2    .   amLODipine (NORVASC) 5 MG tablet   Take 5  mg by mouth daily.            [name] 1 GM 210 MG(Fe) tablet   Take 420 mg by mouth 2 (two) times daily with a meal.        12    .   carvedilol (COREG) 6.25 MG tablet   Take 6.25 mg by mouth 2 (two) times daily with a meal.            .   cinacalcet (SENSIPAR) 90 MG tablet   Take 90 mg by mouth every Monday, Wednesday, and Friday.             .   clotrimazole (LOTRIMIN) 1 % cream   Apply to both feet and between toes twice daily   30 g   2    .   COLCRYS 0.6 MG tablet   Take 0.6 mg by mouth daily as needed (gout).             Marland Kitchen   diphenhydrAMINE (BENADRYL) 25 MG tablet   Take 25 mg by mouth every Monday, Wednesday, and Friday.            .   furosemide (LASIX) 40 MG tablet   Take 40 mg by mouth daily.            Marland Kitchen   gabapentin (NEURONTIN) 100 MG capsule   Take 100 mg by mouth 2 (two) times daily.             .   heparin 1000 UNIT/ML injection   Heparin Sodium (Porcine) 1,000 Units/mL Systemic            .   HYDROcodone-acetaminophen (NORCO) 5-325 MG tablet   Take 1 tablet by mouth every 6 (six) hours as needed for moderate pain.   12 tablet   0    .   Insulin Detemir (LEVEMIR FLEXTOUCH) 100 UNIT/ML Pen   Inject 20 Units into the skin at bedtime.             Marland Kitchen   omeprazole (PRILOSEC) 20 MG capsule   Take by mouth daily.            .   pravastatin (PRAVACHOL) 20 MG tablet   Take 20 mg by mouth daily.                 No current facility-administered medications for this visit.          REVIEW OF SYSTEMS:   [X]  denotes positive finding, [ ]  denotes negative finding   Cardiac       Comments:     Chest pain or chest pressure:            Shortness of breath upon exertion:            Short of breath when lying flat:            Irregular heart rhythm:                         Vascular            Pain in calf, thigh, or hip brought on by ambulation:            Pain in feet at night that wakes you up from your sleep:             Blood clot in your veins:  Leg swelling:                                PHYSICAL EXAM:      Vitals:        12/25/19 0904    BP:   114/78    Pulse:   86    Resp:   20    Temp:   98 F (36.7 C)    SpO2:   97%    Weight:   297 lb (134.7 kg)    Height:   6' (1.829 m)          GENERAL: The patient is a well-nourished male, in no acute distress. The vital signs are documented above.  CARDIOVASCULAR: 2+ radial pulses bilaterally.  Large superficial cephalic vein at the wrist on the left.  He does have occluded somewhat aneurysmal and scarred right radiocephalic fistula.  PULMONARY: There is good air exchange   MUSCULOSKELETAL: There are no major deformities or cyanosis.  NEUROLOGIC: No focal weakness or paresthesias are detected.  SKIN: There are no ulcers or rashes noted.  PSYCHIATRIC: The patient has a normal affect.     DATA:   Duplex today reveals normal arterial flow bilaterally.  He does have occlusion of his cephalic vein in his right upper arm as well extending to the subclavian.  He does have a large caliber basilic vein.  He has a nice sized cephalic vein in his left forearm.     MEDICAL ISSUES:  Had long discussion the patient regarding options.  If he wants to continue dialysis in his nondominant arm, I would recommend a basilic vein transposition fistula.  This in all likelihood could be done in 1 setting.  He is not pleased with the idea of access in his upper arm.  I explained that he does appear to have an  excellent cephalic vein in his left forearm and would be a candidate for a radiocephalic fistula in his dominant left arm.  He will be scheduled to soon as possible on a nondialysis day.  He will make a final decision regarding laterality.           Rosetta Posner, MD FACS  [facility] and [facility]in Specialists of [facility] [phone]  Pager [phone]             Addendum:  The patient has been re-examined and re-evaluated.  The patient's history and physical has been reviewed and is unchanged.    DAMARE SERANO is a 64 y.o. male is being admitted with END STAGE RENAL DISEASE. All the risks, benefits and other treatment options have been discussed with the patient. The patient has consented to proceed with Procedure(s): RIGHT ARM [place] as a surgical intervention.  Joon Pohle 03/06/2020 11:30 AM Vascular and Vein Surgery                              Routing History               Detailed Report             Note shared with patient

## 2020-03-06 NOTE — Anesthesia Procedure Notes (Signed)
Procedure Name: Intubation Date/Time: 03/06/2020 12:14 PM Performed by: Kato Wieczorek T, CRNA Pre-anesthesia Checklist: Patient identified, Emergency Drugs available, Suction available, Patient being monitored and Timeout performed Patient Re-evaluated:Patient Re-evaluated prior to induction Oxygen Delivery Method: Circle system utilized Preoxygenation: Pre-oxygenation with 100% oxygen Induction Type: IV induction Ventilation: Mask ventilation without difficulty and Oral airway inserted - appropriate to patient size Laryngoscope Size: Miller and 3 Grade View: Grade I Tube size: 7.5 mm Number of attempts: 1 Placement Confirmation: ETT inserted through vocal cords under direct vision and positive ETCO2 Tube secured with: Tape Dental Injury: Teeth and Oropharynx as per pre-operative assessment

## 2020-03-06 NOTE — Anesthesia Postprocedure Evaluation (Signed)
Anesthesia Post Note  Patient: [name]  Procedure(s) Performed: RIGHT ARM BASILIC VEIN TRANSPOSITION FISTULA (Right Arm Upper)     Patient location during evaluation: PACU Anesthesia Type: General Level of consciousness: awake Pain management: pain level controlled Vital Signs Assessment: post-procedure vital signs reviewed and stable Respiratory status: spontaneous breathing, nonlabored ventilation, respiratory function stable and patient connected to nasal cannula oxygen Cardiovascular status: blood pressure returned to baseline and stable Postop Assessment: no apparent nausea or vomiting Anesthetic complications: no    Last Vitals:  Vitals:   03/06/20 1505 03/06/20 1520  BP: 102/78 97/64  Pulse: 86 88  Resp: 11 20  Temp: (!) 36.2 C   SpO2: 100% 93%    Last Pain:  Vitals:   03/06/20 1525  PainSc: Asleep                 Ryan P Ellender

## 2020-03-06 NOTE — Transfer of Care (Signed)
Immediate Anesthesia Transfer of Care Note  Patient: [name]  Procedure(s) Performed: RIGHT ARM BASILIC VEIN TRANSPOSITION FISTULA (Right Arm Upper)  Patient Location: PACU  Anesthesia Type:General  Level of Consciousness: awake, alert  and oriented  Airway & Oxygen Therapy: Patient Spontanous Breathing and Patient connected to nasal cannula oxygen  Post-op Assessment: Report given to RN, Post -op Vital signs reviewed and stable and Patient moving all extremities  Post vital signs: Reviewed and stable  Last Vitals:  Vitals Value Taken Time  BP 102/78 03/06/20 1506  Temp 36.2 C 03/06/20 1505  Pulse 88 03/06/20 1514  Resp 16 03/06/20 1514  SpO2 99 % 03/06/20 1514  Vitals shown include unvalidated device data.  Last Pain:  Vitals:   03/06/20 0809  PainSc: 0-No pain         Complications: No apparent anesthesia complications

## 2020-03-07 DIAGNOSIS — N2581 Secondary hyperparathyroidism of renal origin: Secondary | ICD-10-CM | POA: Diagnosis not present

## 2020-03-07 DIAGNOSIS — Z992 Dependence on renal dialysis: Secondary | ICD-10-CM | POA: Diagnosis not present

## 2020-03-07 DIAGNOSIS — N186 End stage renal disease: Secondary | ICD-10-CM | POA: Diagnosis not present

## 2020-03-07 DIAGNOSIS — D509 Iron deficiency anemia, unspecified: Secondary | ICD-10-CM | POA: Diagnosis not present

## 2020-03-07 DIAGNOSIS — D631 Anemia in chronic kidney disease: Secondary | ICD-10-CM | POA: Diagnosis not present

## 2020-03-07 DIAGNOSIS — A499 Bacterial infection, unspecified: Secondary | ICD-10-CM | POA: Diagnosis not present

## 2020-03-08 DIAGNOSIS — E785 Hyperlipidemia, unspecified: Secondary | ICD-10-CM | POA: Diagnosis not present

## 2020-03-08 DIAGNOSIS — K219 Gastro-esophageal reflux disease without esophagitis: Secondary | ICD-10-CM | POA: Diagnosis not present

## 2020-03-08 DIAGNOSIS — F17200 Nicotine dependence, unspecified, uncomplicated: Secondary | ICD-10-CM | POA: Diagnosis not present

## 2020-03-08 DIAGNOSIS — E1165 Type 2 diabetes mellitus with hyperglycemia: Secondary | ICD-10-CM | POA: Diagnosis not present

## 2020-03-08 DIAGNOSIS — E1122 Type 2 diabetes mellitus with diabetic chronic kidney disease: Secondary | ICD-10-CM | POA: Diagnosis not present

## 2020-03-08 DIAGNOSIS — R918 Other nonspecific abnormal finding of lung field: Secondary | ICD-10-CM | POA: Diagnosis not present

## 2020-03-08 DIAGNOSIS — Z85528 Personal history of other malignant neoplasm of kidney: Secondary | ICD-10-CM | POA: Diagnosis not present

## 2020-03-08 DIAGNOSIS — G4733 Obstructive sleep apnea (adult) (pediatric): Secondary | ICD-10-CM | POA: Diagnosis not present

## 2020-03-08 DIAGNOSIS — Z992 Dependence on renal dialysis: Secondary | ICD-10-CM | POA: Diagnosis not present

## 2020-03-08 DIAGNOSIS — H9190 Unspecified hearing loss, unspecified ear: Secondary | ICD-10-CM | POA: Diagnosis not present

## 2020-03-08 DIAGNOSIS — M109 Gout, unspecified: Secondary | ICD-10-CM | POA: Diagnosis not present

## 2020-03-08 DIAGNOSIS — U071 COVID-19: Secondary | ICD-10-CM | POA: Diagnosis not present

## 2020-03-08 DIAGNOSIS — I132 Hypertensive heart and chronic kidney disease with heart failure and with stage 5 chronic kidney disease, or end stage renal disease: Secondary | ICD-10-CM | POA: Diagnosis not present

## 2020-03-08 DIAGNOSIS — E669 Obesity, unspecified: Secondary | ICD-10-CM | POA: Diagnosis not present

## 2020-03-08 DIAGNOSIS — N186 End stage renal disease: Secondary | ICD-10-CM | POA: Diagnosis not present

## 2020-03-08 DIAGNOSIS — Z794 Long term (current) use of insulin: Secondary | ICD-10-CM | POA: Diagnosis not present

## 2020-03-08 DIAGNOSIS — A419 Sepsis, unspecified organism: Secondary | ICD-10-CM | POA: Diagnosis not present

## 2020-03-08 DIAGNOSIS — D631 Anemia in chronic kidney disease: Secondary | ICD-10-CM | POA: Diagnosis not present

## 2020-03-08 DIAGNOSIS — B182 Chronic viral hepatitis C: Secondary | ICD-10-CM | POA: Diagnosis not present

## 2020-03-08 DIAGNOSIS — D472 Monoclonal gammopathy: Secondary | ICD-10-CM | POA: Diagnosis not present

## 2020-03-08 DIAGNOSIS — I509 Heart failure, unspecified: Secondary | ICD-10-CM | POA: Diagnosis not present

## 2020-03-08 DIAGNOSIS — Z6836 Body mass index (BMI) 36.0-36.9, adult: Secondary | ICD-10-CM | POA: Diagnosis not present

## 2020-03-08 DIAGNOSIS — Z905 Acquired absence of kidney: Secondary | ICD-10-CM | POA: Diagnosis not present

## 2020-03-10 DIAGNOSIS — A499 Bacterial infection, unspecified: Secondary | ICD-10-CM | POA: Diagnosis not present

## 2020-03-10 DIAGNOSIS — D509 Iron deficiency anemia, unspecified: Secondary | ICD-10-CM | POA: Diagnosis not present

## 2020-03-10 DIAGNOSIS — N2581 Secondary hyperparathyroidism of renal origin: Secondary | ICD-10-CM | POA: Diagnosis not present

## 2020-03-10 DIAGNOSIS — N186 End stage renal disease: Secondary | ICD-10-CM | POA: Diagnosis not present

## 2020-03-10 DIAGNOSIS — D631 Anemia in chronic kidney disease: Secondary | ICD-10-CM | POA: Diagnosis not present

## 2020-03-10 DIAGNOSIS — Z992 Dependence on renal dialysis: Secondary | ICD-10-CM | POA: Diagnosis not present

## 2020-03-12 DIAGNOSIS — Z992 Dependence on renal dialysis: Secondary | ICD-10-CM | POA: Diagnosis not present

## 2020-03-12 DIAGNOSIS — A499 Bacterial infection, unspecified: Secondary | ICD-10-CM | POA: Diagnosis not present

## 2020-03-12 DIAGNOSIS — D631 Anemia in chronic kidney disease: Secondary | ICD-10-CM | POA: Diagnosis not present

## 2020-03-12 DIAGNOSIS — D509 Iron deficiency anemia, unspecified: Secondary | ICD-10-CM | POA: Diagnosis not present

## 2020-03-12 DIAGNOSIS — N2581 Secondary hyperparathyroidism of renal origin: Secondary | ICD-10-CM | POA: Diagnosis not present

## 2020-03-12 DIAGNOSIS — N186 End stage renal disease: Secondary | ICD-10-CM | POA: Diagnosis not present

## 2020-03-13 DIAGNOSIS — I509 Heart failure, unspecified: Secondary | ICD-10-CM | POA: Diagnosis not present

## 2020-03-13 DIAGNOSIS — E1122 Type 2 diabetes mellitus with diabetic chronic kidney disease: Secondary | ICD-10-CM | POA: Diagnosis not present

## 2020-03-13 DIAGNOSIS — N186 End stage renal disease: Secondary | ICD-10-CM | POA: Diagnosis not present

## 2020-03-13 DIAGNOSIS — A419 Sepsis, unspecified organism: Secondary | ICD-10-CM | POA: Diagnosis not present

## 2020-03-13 DIAGNOSIS — D631 Anemia in chronic kidney disease: Secondary | ICD-10-CM | POA: Diagnosis not present

## 2020-03-13 DIAGNOSIS — I132 Hypertensive heart and chronic kidney disease with heart failure and with stage 5 chronic kidney disease, or end stage renal disease: Secondary | ICD-10-CM | POA: Diagnosis not present

## 2020-03-14 DIAGNOSIS — N2581 Secondary hyperparathyroidism of renal origin: Secondary | ICD-10-CM | POA: Diagnosis not present

## 2020-03-14 DIAGNOSIS — D509 Iron deficiency anemia, unspecified: Secondary | ICD-10-CM | POA: Diagnosis not present

## 2020-03-14 DIAGNOSIS — D631 Anemia in chronic kidney disease: Secondary | ICD-10-CM | POA: Diagnosis not present

## 2020-03-14 DIAGNOSIS — A499 Bacterial infection, unspecified: Secondary | ICD-10-CM | POA: Diagnosis not present

## 2020-03-14 DIAGNOSIS — Z992 Dependence on renal dialysis: Secondary | ICD-10-CM | POA: Diagnosis not present

## 2020-03-14 DIAGNOSIS — N186 End stage renal disease: Secondary | ICD-10-CM | POA: Diagnosis not present

## 2020-03-15 DIAGNOSIS — E1129 Type 2 diabetes mellitus with other diabetic kidney complication: Secondary | ICD-10-CM | POA: Diagnosis not present

## 2020-03-15 DIAGNOSIS — Z992 Dependence on renal dialysis: Secondary | ICD-10-CM | POA: Diagnosis not present

## 2020-03-15 DIAGNOSIS — N186 End stage renal disease: Secondary | ICD-10-CM | POA: Diagnosis not present

## 2020-03-17 ENCOUNTER — Telehealth: Payer: Self-pay

## 2020-03-17 ENCOUNTER — Telehealth (HOSPITAL_COMMUNITY): Payer: Self-pay

## 2020-03-17 DIAGNOSIS — N186 End stage renal disease: Secondary | ICD-10-CM | POA: Diagnosis not present

## 2020-03-17 DIAGNOSIS — Z992 Dependence on renal dialysis: Secondary | ICD-10-CM | POA: Diagnosis not present

## 2020-03-17 DIAGNOSIS — N2581 Secondary hyperparathyroidism of renal origin: Secondary | ICD-10-CM | POA: Diagnosis not present

## 2020-03-17 DIAGNOSIS — D509 Iron deficiency anemia, unspecified: Secondary | ICD-10-CM | POA: Diagnosis not present

## 2020-03-17 DIAGNOSIS — Z23 Encounter for immunization: Secondary | ICD-10-CM | POA: Diagnosis not present

## 2020-03-17 NOTE — Telephone Encounter (Signed)

## 2020-03-17 NOTE — Telephone Encounter (Signed)
Returned pt's wife's call regarding pt having an open area under R armpit. She prefers to come to [place] instead of his PCP since this is the same arm he had surgery on 2 weeks ago. She believes it may be from his shirt rubbing incision area. Scheduled pt to see PA tomorrow.

## 2020-03-18 ENCOUNTER — Ambulatory Visit (INDEPENDENT_AMBULATORY_CARE_PROVIDER_SITE_OTHER): Payer: Self-pay | Admitting: Physician Assistant

## 2020-03-18 ENCOUNTER — Other Ambulatory Visit: Payer: Self-pay

## 2020-03-18 VITALS — BP 117/50 | HR 94 | Temp 97.8°F | Resp 22 | Ht 73.0 in | Wt 286.0 lb

## 2020-03-18 DIAGNOSIS — Z992 Dependence on renal dialysis: Secondary | ICD-10-CM

## 2020-03-18 DIAGNOSIS — N186 End stage renal disease: Secondary | ICD-10-CM

## 2020-03-18 NOTE — Progress Notes (Signed)
POST OPERATIVE OFFICE NOTE    CC:  F/u for surgery  HPI:  This is a 64 y.o. male who is s/p Right second stage basilic vein transposition fistula by Dr. Donnetta Hutching on 03/06/20.  He has some separation of his vein harvest site incisions.  He denise fever and chills.  He denise drainage or erythema.  Allergies  Allergen Reactions  . Penicillins Nausea And Vomiting and Other (See Comments)    Tolerated Augmentin. Has patient had a PCN reaction causing immediate rash, facial/tongue/throat swelling, SOB or lightheadedness with hypotension: No Has patient had a PCN reaction causing severe rash involving mucus membranes or skin necrosis: No Has patient had a PCN reaction that required hospitalization No Has patient had a PCN reaction occurring within the last 10 years: No If all of the above answers are "NO", then may proceed with Cephalosporin use.  Marland Kitchen Propolis Nausea And Vomiting  . Vioxx [Rofecoxib] Nausea And Vomiting    Current Outpatient Medications  Medication Sig Dispense Refill  . acetaminophen (TYLENOL) 325 MG tablet Take 650 mg by mouth every 6 (six) hours as needed for mild pain, moderate pain, fever or headache. Additional on Mon. Wed and Fri before dialysys    . allopurinol (ZYLOPRIM) 100 MG tablet Take 1 tablet (100 mg total) by mouth daily. (Patient taking differently: Take 100 mg by mouth 2 (two) times daily. ) 30 tablet 2  . amLODipine (NORVASC) 5 MG tablet Take 5 mg by mouth daily.    [name] 1 GM 210 MG(Fe) tablet Take 420 mg by mouth 2 (two) times daily with a meal.   12  . cinacalcet (SENSIPAR) 90 MG tablet Take 90 mg by mouth daily.     . diphenhydrAMINE (BENADRYL) 25 MG tablet Take 25 mg by mouth every Monday, Wednesday, and Friday.    . Insulin Detemir (LEVEMIR FLEXTOUCH) 100 UNIT/ML Pen Inject 20 Units into the skin daily.     . midodrine (PROAMATINE) 5 MG tablet Take 1 tablet (5 mg total) by mouth 2 (two) times daily with a meal. 60 tablet 0  . mirtazapine (REMERON) 15  MG tablet Take 15 mg by mouth at bedtime as needed (sleep).    Marland Kitchen omeprazole (PRILOSEC) 20 MG capsule Take 20 mg by mouth daily.     Marland Kitchen oxyCODONE-acetaminophen (PERCOCET) 7.5-325 MG tablet Take 1 tablet by mouth every 4 (four) hours as needed for severe pain. 20 tablet 0  . pravastatin (PRAVACHOL) 20 MG tablet Take 20 mg by mouth daily.      No current facility-administered medications for this visit.     ROS:  See HPI  Physical Exam:  Vitals:   03/18/20 1110  BP: (!) 117/50  Pulse: 94  Resp: (!) 22  Temp: 97.8 F (36.6 C)  SpO2: 98%     Incision:  Axillary and proximal incisions with slight superficial  separation of the incision.  No drainage, erythema or edema.   Extremities:  Right UE with pulsatilely thrill in the fistula, right hand with intact motor, sensation, and palpable radial pulse   Assessment/Plan:  This is a 64 y.o. male who is s/p:right UE basilic transposition with superficial separation of the 2 proximal incisions.  He will wash the area with soap and water daily and apply a dry dressing.    He will f/u in 2-3 weeks for examination.  He is on HD TTS via left TDC. He denise fever and chills.    Roxy Horseman PA-C [facility] Specialists [phone]  Clinic MD:  Carlis Abbott

## 2020-03-19 ENCOUNTER — Ambulatory Visit: Payer: Medicare Other | Admitting: Cardiovascular Disease

## 2020-03-19 DIAGNOSIS — N2581 Secondary hyperparathyroidism of renal origin: Secondary | ICD-10-CM | POA: Diagnosis not present

## 2020-03-19 DIAGNOSIS — N186 End stage renal disease: Secondary | ICD-10-CM | POA: Diagnosis not present

## 2020-03-19 DIAGNOSIS — D509 Iron deficiency anemia, unspecified: Secondary | ICD-10-CM | POA: Diagnosis not present

## 2020-03-19 DIAGNOSIS — Z23 Encounter for immunization: Secondary | ICD-10-CM | POA: Diagnosis not present

## 2020-03-19 DIAGNOSIS — Z992 Dependence on renal dialysis: Secondary | ICD-10-CM | POA: Diagnosis not present

## 2020-03-20 DIAGNOSIS — E1122 Type 2 diabetes mellitus with diabetic chronic kidney disease: Secondary | ICD-10-CM | POA: Diagnosis not present

## 2020-03-20 DIAGNOSIS — N186 End stage renal disease: Secondary | ICD-10-CM | POA: Diagnosis not present

## 2020-03-20 DIAGNOSIS — D631 Anemia in chronic kidney disease: Secondary | ICD-10-CM | POA: Diagnosis not present

## 2020-03-20 DIAGNOSIS — A419 Sepsis, unspecified organism: Secondary | ICD-10-CM | POA: Diagnosis not present

## 2020-03-20 DIAGNOSIS — I132 Hypertensive heart and chronic kidney disease with heart failure and with stage 5 chronic kidney disease, or end stage renal disease: Secondary | ICD-10-CM | POA: Diagnosis not present

## 2020-03-20 DIAGNOSIS — I509 Heart failure, unspecified: Secondary | ICD-10-CM | POA: Diagnosis not present

## 2020-03-20 NOTE — Progress Notes (Signed)
Spoke with the patient and provided his cxr results.  Verbalized understanding.  No questions.

## 2020-03-21 DIAGNOSIS — D509 Iron deficiency anemia, unspecified: Secondary | ICD-10-CM | POA: Diagnosis not present

## 2020-03-21 DIAGNOSIS — Z992 Dependence on renal dialysis: Secondary | ICD-10-CM | POA: Diagnosis not present

## 2020-03-21 DIAGNOSIS — N186 End stage renal disease: Secondary | ICD-10-CM | POA: Diagnosis not present

## 2020-03-21 DIAGNOSIS — Z23 Encounter for immunization: Secondary | ICD-10-CM | POA: Diagnosis not present

## 2020-03-21 DIAGNOSIS — N2581 Secondary hyperparathyroidism of renal origin: Secondary | ICD-10-CM | POA: Diagnosis not present

## 2020-03-24 DIAGNOSIS — Z23 Encounter for immunization: Secondary | ICD-10-CM | POA: Diagnosis not present

## 2020-03-24 DIAGNOSIS — D509 Iron deficiency anemia, unspecified: Secondary | ICD-10-CM | POA: Diagnosis not present

## 2020-03-24 DIAGNOSIS — Z992 Dependence on renal dialysis: Secondary | ICD-10-CM | POA: Diagnosis not present

## 2020-03-24 DIAGNOSIS — N2581 Secondary hyperparathyroidism of renal origin: Secondary | ICD-10-CM | POA: Diagnosis not present

## 2020-03-24 DIAGNOSIS — N186 End stage renal disease: Secondary | ICD-10-CM | POA: Diagnosis not present

## 2020-03-26 DIAGNOSIS — N186 End stage renal disease: Secondary | ICD-10-CM | POA: Diagnosis not present

## 2020-03-26 DIAGNOSIS — Z23 Encounter for immunization: Secondary | ICD-10-CM | POA: Diagnosis not present

## 2020-03-26 DIAGNOSIS — Z992 Dependence on renal dialysis: Secondary | ICD-10-CM | POA: Diagnosis not present

## 2020-03-26 DIAGNOSIS — D509 Iron deficiency anemia, unspecified: Secondary | ICD-10-CM | POA: Diagnosis not present

## 2020-03-26 DIAGNOSIS — N2581 Secondary hyperparathyroidism of renal origin: Secondary | ICD-10-CM | POA: Diagnosis not present

## 2020-03-27 DIAGNOSIS — D472 Monoclonal gammopathy: Secondary | ICD-10-CM | POA: Diagnosis not present

## 2020-03-27 DIAGNOSIS — E1122 Type 2 diabetes mellitus with diabetic chronic kidney disease: Secondary | ICD-10-CM | POA: Diagnosis not present

## 2020-03-27 DIAGNOSIS — R918 Other nonspecific abnormal finding of lung field: Secondary | ICD-10-CM | POA: Diagnosis not present

## 2020-03-27 DIAGNOSIS — M109 Gout, unspecified: Secondary | ICD-10-CM | POA: Diagnosis not present

## 2020-03-27 DIAGNOSIS — N186 End stage renal disease: Secondary | ICD-10-CM | POA: Diagnosis not present

## 2020-03-27 DIAGNOSIS — E782 Mixed hyperlipidemia: Secondary | ICD-10-CM | POA: Diagnosis not present

## 2020-03-27 DIAGNOSIS — L89319 Pressure ulcer of right buttock, unspecified stage: Secondary | ICD-10-CM | POA: Diagnosis not present

## 2020-03-27 DIAGNOSIS — I132 Hypertensive heart and chronic kidney disease with heart failure and with stage 5 chronic kidney disease, or end stage renal disease: Secondary | ICD-10-CM | POA: Diagnosis not present

## 2020-03-27 DIAGNOSIS — A419 Sepsis, unspecified organism: Secondary | ICD-10-CM | POA: Diagnosis not present

## 2020-03-27 DIAGNOSIS — D631 Anemia in chronic kidney disease: Secondary | ICD-10-CM | POA: Diagnosis not present

## 2020-03-27 DIAGNOSIS — I509 Heart failure, unspecified: Secondary | ICD-10-CM | POA: Diagnosis not present

## 2020-03-27 DIAGNOSIS — I10 Essential (primary) hypertension: Secondary | ICD-10-CM | POA: Diagnosis not present

## 2020-03-27 DIAGNOSIS — G4730 Sleep apnea, unspecified: Secondary | ICD-10-CM | POA: Diagnosis not present

## 2020-03-27 DIAGNOSIS — E0842 Diabetes mellitus due to underlying condition with diabetic polyneuropathy: Secondary | ICD-10-CM | POA: Diagnosis not present

## 2020-03-28 DIAGNOSIS — N186 End stage renal disease: Secondary | ICD-10-CM | POA: Diagnosis not present

## 2020-03-28 DIAGNOSIS — Z992 Dependence on renal dialysis: Secondary | ICD-10-CM | POA: Diagnosis not present

## 2020-03-28 DIAGNOSIS — N2581 Secondary hyperparathyroidism of renal origin: Secondary | ICD-10-CM | POA: Diagnosis not present

## 2020-03-28 DIAGNOSIS — Z23 Encounter for immunization: Secondary | ICD-10-CM | POA: Diagnosis not present

## 2020-03-28 DIAGNOSIS — D509 Iron deficiency anemia, unspecified: Secondary | ICD-10-CM | POA: Diagnosis not present

## 2020-03-31 DIAGNOSIS — Z992 Dependence on renal dialysis: Secondary | ICD-10-CM | POA: Diagnosis not present

## 2020-03-31 DIAGNOSIS — N186 End stage renal disease: Secondary | ICD-10-CM | POA: Diagnosis not present

## 2020-03-31 DIAGNOSIS — N2581 Secondary hyperparathyroidism of renal origin: Secondary | ICD-10-CM | POA: Diagnosis not present

## 2020-03-31 DIAGNOSIS — Z23 Encounter for immunization: Secondary | ICD-10-CM | POA: Diagnosis not present

## 2020-03-31 DIAGNOSIS — D509 Iron deficiency anemia, unspecified: Secondary | ICD-10-CM | POA: Diagnosis not present

## 2020-04-01 ENCOUNTER — Other Ambulatory Visit: Payer: Self-pay

## 2020-04-01 ENCOUNTER — Ambulatory Visit (INDEPENDENT_AMBULATORY_CARE_PROVIDER_SITE_OTHER): Payer: Medicare Other | Admitting: Cardiovascular Disease

## 2020-04-01 ENCOUNTER — Encounter: Payer: Self-pay | Admitting: Cardiovascular Disease

## 2020-04-01 ENCOUNTER — Other Ambulatory Visit: Payer: Self-pay | Admitting: *Deleted

## 2020-04-01 DIAGNOSIS — Z720 Tobacco use: Secondary | ICD-10-CM | POA: Diagnosis not present

## 2020-04-01 DIAGNOSIS — N186 End stage renal disease: Secondary | ICD-10-CM

## 2020-04-01 DIAGNOSIS — I10 Essential (primary) hypertension: Secondary | ICD-10-CM | POA: Diagnosis not present

## 2020-04-01 DIAGNOSIS — E782 Mixed hyperlipidemia: Secondary | ICD-10-CM | POA: Diagnosis not present

## 2020-04-01 NOTE — Assessment & Plan Note (Signed)
History of hyperlipidemia on pravastatin followed by his PCP 

## 2020-04-01 NOTE — Progress Notes (Signed)
04/01/2020 [name]   1956/03/23  [id number]  Primary Physician [name], MD Primary Cardiologist: Lorretta Harp MD Lupe Carney, [place]  HPI:  [name] is a 64 y.o. severely overweight married African-American male father of 2, grandfather of 4 grandchildren referred by Dr. Moreen Fowler because of cardiovascular risk factors.  He is about to retire from [employer] where he is worked for 30 years.  His cardiac risk factor profile is notable for discontinue tobacco abuse having quit 10 years ago which time his smoking cigars.  Does have a history of treated hypertension, diabetes and hyperlipidemia.  There is no family history for heart disease.  Never had a heart tach or stroke.  Denies chest pain or shortness of breath.  He has been on hemodialysis for last 7 years.  He has obstructive sleep apnea not currently on CPAP.  Does have a Charcot left foot and currently is wearing a boot.  He denies chest pain or shortness of breath.   Current Meds  Medication Sig  . acetaminophen (TYLENOL) 325 MG tablet Take 650 mg by mouth every 6 (six) hours as needed for mild pain, moderate pain, fever or headache. Additional on Mon. Wed and Fri before dialysys  . allopurinol (ZYLOPRIM) 100 MG tablet Take 1 tablet (100 mg total) by mouth daily. (Patient taking differently: Take 100 mg by mouth 2 (two) times daily. )  . amLODipine (NORVASC) 5 MG tablet Take 5 mg by mouth daily.  . colchicine 0.6 MG tablet Take 0.6 mg by mouth daily.  . diphenhydrAMINE (BENADRYL) 25 MG tablet Take 25 mg by mouth every Monday, Wednesday, and Friday.  . Insulin Detemir (LEVEMIR FLEXTOUCH) 100 UNIT/ML Pen Inject 20 Units into the skin daily.   . midodrine (PROAMATINE) 5 MG tablet Take 1 tablet (5 mg total) by mouth 2 (two) times daily with a meal.  . mirtazapine (REMERON) 15 MG tablet Take 15 mg by mouth at bedtime as needed (sleep).  . pravastatin (PRAVACHOL) 20 MG tablet Take 20 mg by mouth daily.       Allergies  Allergen Reactions  . Penicillins Nausea And Vomiting and Other (See Comments)    Tolerated Augmentin. Has patient had a PCN reaction causing immediate rash, facial/tongue/throat swelling, SOB or lightheadedness with hypotension: No Has patient had a PCN reaction causing severe rash involving mucus membranes or skin necrosis: No Has patient had a PCN reaction that required hospitalization No Has patient had a PCN reaction occurring within the last 10 years: No If all of the above answers are "NO", then may proceed with Cephalosporin use.  [name] Propolis Nausea And Vomiting  . Vioxx [Rofecoxib] Nausea And Vomiting    Social History   Socioeconomic History  . Marital status: Married    Spouse name: [name]  . Number of children: 2  . Years of education: 61  . Highest education level: Not on file  Occupational History  . Occupation: Security  Tobacco Use  . Smoking status: Former Smoker    Packs/day: 0.30    Years: 33.00    Pack years: 9.90    Types: Cigarettes    Quit date: 07/16/2010    Years since quitting: 9.7  . Smokeless tobacco: Never Used  Substance and Sexual Activity  . Alcohol use: No  . Drug use: No  . Sexual activity: Yes  Other Topics Concern  . Not on file  Social History Narrative   Married, lives in [place] with  wife.   Social Determinants of Health   Financial Resource Strain:   . Difficulty of Paying Living Expenses:   Food Insecurity:   . Worried About Charity fundraiser in the Last Year:   . Arboriculturist in the Last Year:   Transportation Needs:   . Film/video editor (Medical):   [name] Lack of Transportation (Non-Medical):   Physical Activity:   . Days of Exercise per Week:   . Minutes of Exercise per Session:   Stress:   . Feeling of Stress :   Social Connections:   . Frequency of Communication with Friends and Family:   . Frequency of Social Gatherings with Friends and Family:   . Attends Religious Services:   . Active Member of  Clubs or Organizations:   . Attends Archivist Meetings:   [name] Marital Status:   Intimate Partner Violence:   . Fear of Current or Ex-Partner:   . Emotionally Abused:   [name] Physically Abused:   . Sexually Abused:      Review of Systems: General: negative for chills, fever, night sweats or weight changes.  Cardiovascular: negative for chest pain, dyspnea on exertion, edema, orthopnea, palpitations, paroxysmal nocturnal dyspnea or shortness of breath Dermatological: negative for rash Respiratory: negative for cough or wheezing Urologic: negative for hematuria Abdominal: negative for nausea, vomiting, diarrhea, bright red blood per rectum, melena, or hematemesis Neurologic: negative for visual changes, syncope, or dizziness All other systems reviewed and are otherwise negative except as noted above.    Blood pressure 90/70, pulse 88, height 6\' 1"  (1.854 m), weight 285 lb (129.3 kg).  General appearance: alert and no distress Neck: no adenopathy, no carotid bruit, no JVD, supple, symmetrical, trachea midline and thyroid not enlarged, symmetric, no tenderness/mass/nodules Lungs: clear to auscultation bilaterally Heart: regular rate and rhythm, S1, S2 normal, no murmur, click, rub or gallop Extremities: extremities normal, atraumatic, no cyanosis or edema Pulses: Minutes pedal pulses Skin: Skin color, texture, turgor normal. No rashes or lesions Neurologic: Alert and oriented X 3, normal strength and tone. Normal symmetric reflexes. Normal coordination and gait  EKG not performed today  ASSESSMENT AND PLAN:   Hypertension History of essential hypertension and on amlodipine with blood pressure measured today at 90/70.  Hyperlipidemia History of hyperlipidemia on pravastatin followed by his PCP  Tobacco abuse History of remote tobacco abuse having quit 10 years ago.  At that time, he was smoking an occasional cigar.      Lorretta Harp MD FACP,FACC,FAHA,  [facility] 04/01/2020 9:17 AM

## 2020-04-01 NOTE — Assessment & Plan Note (Signed)
History of essential hypertension and on amlodipine with blood pressure measured today at 90/70.

## 2020-04-01 NOTE — Assessment & Plan Note (Signed)
History of remote tobacco abuse having quit 10 years ago.  At that time, he was smoking an occasional cigar.

## 2020-04-01 NOTE — Patient Instructions (Signed)
Medication Instructions:  Your physician recommends that you continue on your current medications as directed. Please refer to the Current Medication list given to you today.  *If you need a refill on your cardiac medications before your next appointment, please call your pharmacy*   Follow-Up: At [facility], you and your health needs are our priority.  As part of our continuing mission to provide you with exceptional heart care, we have created designated Provider Care Teams.  These Care Teams include your primary Cardiologist (physician) and Advanced Practice Providers (APPs -  Physician Assistants and Nurse Practitioners) who all work together to provide you with the care you need, when you need it.  We recommend signing up for the patient portal called "MyChart".  Sign up information is provided on this After Visit Summary.  MyChart is used to connect with patients for Virtual Visits (Telemedicine).  Patients are able to view lab/test results, encounter notes, upcoming appointments, etc.  Non-urgent messages can be sent to your provider as well.   To learn more about what you can do with MyChart, go to [email or web address]://[email or web address].com.    Your next appointment:   12 month(s)  The format for your next appointment:   In Person  Provider:   You may see Jonathan Berry, MD or one of the following Advanced Practice Providers on your designated Care Team:    Luke Kilroy, PA-C  Callie Goodrich, PA-C  Jesse Cleaver, FNP    Other Instructions Please call our office 2 months in advance to schedule your follow-up appointment.  

## 2020-04-02 DIAGNOSIS — N186 End stage renal disease: Secondary | ICD-10-CM | POA: Diagnosis not present

## 2020-04-02 DIAGNOSIS — N2581 Secondary hyperparathyroidism of renal origin: Secondary | ICD-10-CM | POA: Diagnosis not present

## 2020-04-02 DIAGNOSIS — Z992 Dependence on renal dialysis: Secondary | ICD-10-CM | POA: Diagnosis not present

## 2020-04-02 DIAGNOSIS — D509 Iron deficiency anemia, unspecified: Secondary | ICD-10-CM | POA: Diagnosis not present

## 2020-04-02 DIAGNOSIS — Z23 Encounter for immunization: Secondary | ICD-10-CM | POA: Diagnosis not present

## 2020-04-04 DIAGNOSIS — Z23 Encounter for immunization: Secondary | ICD-10-CM | POA: Diagnosis not present

## 2020-04-04 DIAGNOSIS — N186 End stage renal disease: Secondary | ICD-10-CM | POA: Diagnosis not present

## 2020-04-04 DIAGNOSIS — D509 Iron deficiency anemia, unspecified: Secondary | ICD-10-CM | POA: Diagnosis not present

## 2020-04-04 DIAGNOSIS — Z992 Dependence on renal dialysis: Secondary | ICD-10-CM | POA: Diagnosis not present

## 2020-04-04 DIAGNOSIS — N2581 Secondary hyperparathyroidism of renal origin: Secondary | ICD-10-CM | POA: Diagnosis not present

## 2020-04-07 ENCOUNTER — Telehealth (HOSPITAL_COMMUNITY): Payer: Self-pay

## 2020-04-07 DIAGNOSIS — N2581 Secondary hyperparathyroidism of renal origin: Secondary | ICD-10-CM | POA: Diagnosis not present

## 2020-04-07 DIAGNOSIS — D509 Iron deficiency anemia, unspecified: Secondary | ICD-10-CM | POA: Diagnosis not present

## 2020-04-07 DIAGNOSIS — Z23 Encounter for immunization: Secondary | ICD-10-CM | POA: Diagnosis not present

## 2020-04-07 DIAGNOSIS — Z992 Dependence on renal dialysis: Secondary | ICD-10-CM | POA: Diagnosis not present

## 2020-04-07 DIAGNOSIS — N186 End stage renal disease: Secondary | ICD-10-CM | POA: Diagnosis not present

## 2020-04-07 NOTE — Telephone Encounter (Signed)

## 2020-04-08 ENCOUNTER — Ambulatory Visit (HOSPITAL_COMMUNITY)
Admission: RE | Admit: 2020-04-08 | Discharge: 2020-04-08 | Disposition: A | Discharge status: Home / Self Care | Payer: Medicare Other | Source: Ambulatory Visit | Attending: Vascular Surgery | Admitting: Vascular Surgery

## 2020-04-08 ENCOUNTER — Other Ambulatory Visit: Payer: Self-pay

## 2020-04-08 ENCOUNTER — Ambulatory Visit (INDEPENDENT_AMBULATORY_CARE_PROVIDER_SITE_OTHER): Payer: Self-pay | Admitting: Physician Assistant

## 2020-04-08 VITALS — BP 152/68 | HR 90 | Temp 97.0°F | Resp 20 | Ht 73.0 in | Wt 282.2 lb

## 2020-04-08 DIAGNOSIS — Z992 Dependence on renal dialysis: Secondary | ICD-10-CM

## 2020-04-08 DIAGNOSIS — N186 End stage renal disease: Secondary | ICD-10-CM

## 2020-04-08 NOTE — Progress Notes (Signed)
POST OPERATIVE DIALYSIS ACCESS OFFICE NOTE    CC:  F/u for dialysis access surgery; 4 1/2 weeks post-op  HPI:  This is a 64 y.o. male who is s/p single stage right UE  basilic transposition.  He followed up a couple of weeks ago and was found to have some superficial separation of the 2 proximal incisions. He returns today for inspection of these areas and duplex.    He states that his wife feels this is infected up in the axilla.  He states it has an odor.  There is what looks like old blood on the dressing removed but nothing concerning for infection.  He has not had any fevers.  He cleans them daily with soap and water.  He states he does have some numbness of the right hand but it is not painful.    He states that his last day at [facility] will be on Friday and he will start going to dialysis at the [place] in [place] next week on M/W/F.     Dialysis days:  Tuesdays, Thursdays, Saturdays   Dialysis center:  [place], [place] HD access: left IJ [facility]  Allergies  Allergen Reactions  . Penicillins Nausea And Vomiting and Other (See Comments)    Tolerated Augmentin. Has patient had a PCN reaction causing immediate rash, facial/tongue/throat swelling, SOB or lightheadedness with hypotension: No Has patient had a PCN reaction causing severe rash involving mucus membranes or skin necrosis: No Has patient had a PCN reaction that required hospitalization No Has patient had a PCN reaction occurring within the last 10 years: No If all of the above answers are "NO", then may proceed with Cephalosporin use.  Marland Kitchen Propolis Nausea And Vomiting  . Vioxx [Rofecoxib] Nausea And Vomiting    Current Outpatient Medications  Medication Sig Dispense Refill  . acetaminophen (TYLENOL) 325 MG tablet Take 650 mg by mouth every 6 (six) hours as needed for mild pain, moderate pain, fever or headache. Additional on Mon. Wed and Fri before dialysys    . allopurinol (ZYLOPRIM) 100 MG tablet Take 1 tablet  (100 mg total) by mouth daily. (Patient taking differently: Take 100 mg by mouth 2 (two) times daily. ) 30 tablet 2  . amLODipine (NORVASC) 5 MG tablet Take 5 mg by mouth daily.    . colchicine 0.6 MG tablet Take 0.6 mg by mouth daily.    . diphenhydrAMINE (BENADRYL) 25 MG tablet Take 25 mg by mouth every Monday, Wednesday, and Friday.    . Insulin Detemir (LEVEMIR FLEXTOUCH) 100 UNIT/ML Pen Inject 20 Units into the skin daily.     . midodrine (PROAMATINE) 5 MG tablet Take 1 tablet (5 mg total) by mouth 2 (two) times daily with a meal. 60 tablet 0  . mirtazapine (REMERON) 15 MG tablet Take 15 mg by mouth at bedtime as needed (sleep).    . pravastatin (PRAVACHOL) 20 MG tablet Take 20 mg by mouth daily.      No current facility-administered medications for this visit.     ROS:  See HPI   Physical Exam:  Today's Vitals   04/08/20 1133 04/08/20 1135  BP: (!) 152/68   Pulse: 90   Resp: 20   Temp: (!) 97 F (36.1 C)   TempSrc: Temporal   SpO2: 94%   Weight: 282 lb 3 oz (128 kg)   Height: 6\' 1"  (1.854 m)   PainSc:  4    Body mass index is 37.23 kg/m.   General appearance:no  distress Cardiac:regular Respiratory:non labored Incision:    Unable to express any fluid from the axillary incision.     Extremities:  Faintly palpable right radial pulse; there is an excellent thrill in the fistula.  Motor and sensation in tact right hand.  Bilateral hand grips are equal.     Dialysis duplex on 04/08/2020 +------------+----------+-------------+----------+--------+  OUTFLOW VEINPSV (cm/s)Diameter (cm)Depth (cm)Describe  +------------+----------+-------------+----------+--------+  Prox UA     115    0.82     1.60  bandage   +------------+----------+-------------+----------+--------+  Mid UA     179    0.44     0.31   fluid   +------------+----------+-------------+----------+--------+  Dist UA     306    0.53     0.49   edema    +------------+----------+-------------+----------+--------+  AC Fossa    451    0.44     0.64        +------------+----------+-------------+----------+--------+   Assessment/Plan:   -pt does not have evidence of steal syndrome; he does have some numbness in his right hand but motor and sensation are in tact -dialysis duplex today reveals fistula is maturing.  Should be ready to use by 06/02/2020.   -his incisions are slow to heal, but no evidence of infection.  He has not had any fevers.   I was unable to express any fluid form the wound.  I instructed him to wash with antibacterial soap daily and apply dry dressing daily and as needed.   -once the fistula has been used to the satisfaction of the dialysis center, he can be scheduled to have his catheter removed.  -will see him back in 2-3 weeks to check his incisions.  He will call sooner if there are any changes.   Leontine Locket, [facility] 04/08/2020 12:24 PM [facility] [phone]  Clinic MD:  Early

## 2020-04-09 DIAGNOSIS — N2581 Secondary hyperparathyroidism of renal origin: Secondary | ICD-10-CM | POA: Diagnosis not present

## 2020-04-09 DIAGNOSIS — Z992 Dependence on renal dialysis: Secondary | ICD-10-CM | POA: Diagnosis not present

## 2020-04-09 DIAGNOSIS — D509 Iron deficiency anemia, unspecified: Secondary | ICD-10-CM | POA: Diagnosis not present

## 2020-04-09 DIAGNOSIS — N186 End stage renal disease: Secondary | ICD-10-CM | POA: Diagnosis not present

## 2020-04-09 DIAGNOSIS — Z23 Encounter for immunization: Secondary | ICD-10-CM | POA: Diagnosis not present

## 2020-04-21 ENCOUNTER — Telehealth: Payer: Self-pay

## 2020-04-21 NOTE — Telephone Encounter (Signed)
Kerri [facility] noted [facility] while patient at dialysis, no thrill or bruit present at his Rt. 2nd stage BVT fistula completed on 03/06/20. He has been receiving dialysis via [facility] . Inquired if pt needs a fistulogram with possible thrombectomy.   Spoke with Gerri Lins, PA who recommended office visit with fistula duplex prior on either Tues or Thurs. Information provided to scheduling to arrange studies and appt. Minette Brine, RN

## 2020-04-22 ENCOUNTER — Other Ambulatory Visit: Payer: Self-pay | Admitting: *Deleted

## 2020-04-22 DIAGNOSIS — Z992 Dependence on renal dialysis: Secondary | ICD-10-CM

## 2020-04-22 DIAGNOSIS — N186 End stage renal disease: Secondary | ICD-10-CM

## 2020-04-24 ENCOUNTER — Ambulatory Visit (HOSPITAL_COMMUNITY): Payer: Medicare Other | Attending: Vascular Surgery

## 2020-04-24 ENCOUNTER — Ambulatory Visit: Payer: Medicare Other

## 2020-04-29 ENCOUNTER — Ambulatory Visit (INDEPENDENT_AMBULATORY_CARE_PROVIDER_SITE_OTHER): Payer: Self-pay | Admitting: Physician Assistant

## 2020-04-29 ENCOUNTER — Other Ambulatory Visit: Payer: Self-pay

## 2020-04-29 VITALS — BP 107/67 | HR 97 | Temp 97.8°F | Resp 18 | Ht 72.0 in | Wt 273.4 lb

## 2020-04-29 DIAGNOSIS — N186 End stage renal disease: Secondary | ICD-10-CM

## 2020-04-29 DIAGNOSIS — S81801A Unspecified open wound, right lower leg, initial encounter: Secondary | ICD-10-CM

## 2020-04-29 DIAGNOSIS — Z992 Dependence on renal dialysis: Secondary | ICD-10-CM

## 2020-04-29 NOTE — Progress Notes (Signed)
POST OPERATIVE OFFICE NOTE    CC:  F/u for surgery  HPI:  This is a 64 y.o. male who is  s/p single stage right UE  basilic transposition.  on 03/06/2020 by Dr. Donnetta Hutching.  He has a TDC that was placed 01/18/2020 by IR.    Pt was seen on 04/08/2020 and at that time, he had some superficial separation of the 2 proximal incisions.  He stated his wife was concerned about the area and worried about infection.  He did not have any fevers and looked like old blood on the dressing that was removed but nothing concerning.  He was cleaning them daily with soap and water.  He did have some numbness of the right hand but no pain.    Pt returns today for follow up for wound check on his right arm.  He states he gets some occasional tingling in his right upper arm every now and then.  Says his wounds have healed on his arm.  He does state he has a wound on his right leg that has been present for about 3 months since he was in the hospital.  He states he feels it has gotten a little bit better.  He states it has not been draining.  He has not had any fevers.  He does not get any cramping when he walks.  He does not have rest pain.  He has a charcot foot on the left and wears a boot for this.   The pt is on dialysis M/W/F now at the dialysis center at the [place] in [place].   Allergies  Allergen Reactions  . Penicillins Nausea And Vomiting and Other (See Comments)    Tolerated Augmentin. Has patient had a PCN reaction causing immediate rash, facial/tongue/throat swelling, SOB or lightheadedness with hypotension: No Has patient had a PCN reaction causing severe rash involving mucus membranes or skin necrosis: No Has patient had a PCN reaction that required hospitalization No Has patient had a PCN reaction occurring within the last 10 years: No If all of the above answers are "NO", then may proceed with Cephalosporin use.  Marland Kitchen Propolis Nausea And Vomiting  . Vioxx [Rofecoxib] Nausea And Vomiting    Current  Outpatient Medications  Medication Sig Dispense Refill  . acetaminophen (TYLENOL) 325 MG tablet Take 650 mg by mouth every 6 (six) hours as needed for mild pain, moderate pain, fever or headache. Additional on Mon. Wed and Fri before dialysys    . allopurinol (ZYLOPRIM) 100 MG tablet Take 1 tablet (100 mg total) by mouth daily. (Patient taking differently: Take 100 mg by mouth 2 (two) times daily. ) 30 tablet 2  . amLODipine (NORVASC) 5 MG tablet Take 5 mg by mouth daily.    . colchicine 0.6 MG tablet Take 0.6 mg by mouth daily.    . diphenhydrAMINE (BENADRYL) 25 MG tablet Take 25 mg by mouth every Monday, Wednesday, and Friday.    . Insulin Detemir (LEVEMIR FLEXTOUCH) 100 UNIT/ML Pen Inject 20 Units into the skin daily.     . pravastatin (PRAVACHOL) 20 MG tablet Take 20 mg by mouth daily.      No current facility-administered medications for this visit.     ROS:  See HPI  Physical Exam:  Today's Vitals   04/29/20 0932  BP: 107/67  Pulse: 97  Resp: 18  Temp: 97.8 F (36.6 C)  TempSrc: Temporal  SpO2: 99%  Weight: 273 lb 5.9 oz (124 kg)  Height: 6' (  1.829 m)  PainSc: 4    Body mass index is 37.08 kg/m.   Incision:  RUA incisions have healed.   Extremities:  There is a palpable right radial pulse.  Motor and sensory are in tact.  There is a thrill/bruit present.  There is a wound on the right lateral leg above the lateral malleolus that is not painful.  There is no drainage or erythema.  Monophasic doppler signals right PT/peroneal and AT.  Could not find DP doppler signal.  Unable to palpate bilateral popliteal pulses.     Abdomen:  Soft, NT/ND; no palpable pulsatile masses  Venous duplex November 2017: Venous incompetence in right CFV and left popliteal vein   Assessment/Plan:  This is a 64 y.o. male who is s/p: single stage right UE  basilic transposition.  on 03/06/2020 by Dr. Donnetta Hutching.  ESRD -the pt does not have evidence of steal. -the fistula/graft can be used  06/05/2020. -If pt has tunneled dialysis catheter and the access has been used successfully to the satisfaction of the dialysis center, the tunneled catheter can be removed at their discretion.   -the pt will follow up as needed for his dialysis access  Non Healing RLE lateral leg ulcer -wound has been present since his hospitalization in March per pt.  He states he feels it has gotten a little bit better.  I discussed with pt that he would need an arteriogram to evaluate his blood flow but he does not want to proceed with this.  I do not see where he has had ABI's so I have ordered these and will have pt to follow up with MD in a couple of weeks.  He does not have pain around the ulcer, so hopeful he will tolerate ABI.  I do not feel a right femoral pulse but could palpate the left femoral pulse.  He has monophasic doppler signals right PT/peroneal/AT but could not get a DP.   Discussed with pt that if this gets worse, he could be at risk for limb loss.  Discussed with him to return to see [place] sooner if this wound changes, starts draining or he starts running fevers.  He is agreeable to this plan.   Pt is on statin  Leontine Locket, [facility] [phone]  Clinic MD:  Early

## 2020-04-30 ENCOUNTER — Other Ambulatory Visit: Payer: Self-pay | Admitting: *Deleted

## 2020-04-30 DIAGNOSIS — R609 Edema, unspecified: Secondary | ICD-10-CM

## 2020-04-30 DIAGNOSIS — S81801A Unspecified open wound, right lower leg, initial encounter: Secondary | ICD-10-CM

## 2020-05-01 ENCOUNTER — Ambulatory Visit (HOSPITAL_COMMUNITY)
Admission: RE | Admit: 2020-05-01 | Discharge: 2020-05-01 | Disposition: A | Discharge status: Home / Self Care | Payer: Medicare Other | Source: Ambulatory Visit | Attending: Vascular Surgery | Admitting: Vascular Surgery

## 2020-05-01 ENCOUNTER — Other Ambulatory Visit: Payer: Self-pay | Admitting: *Deleted

## 2020-05-01 ENCOUNTER — Other Ambulatory Visit: Payer: Self-pay

## 2020-05-01 DIAGNOSIS — R609 Edema, unspecified: Secondary | ICD-10-CM | POA: Insufficient documentation

## 2020-05-01 DIAGNOSIS — S81801A Unspecified open wound, right lower leg, initial encounter: Secondary | ICD-10-CM

## 2020-05-02 ENCOUNTER — Telehealth: Payer: Self-pay

## 2020-05-02 NOTE — Telephone Encounter (Signed)
Santiago Glad from [place], [place] called about this patient.  She want's to make sure we look at his fistula and not just his leg wounds at his next appt on 6/29.  Says his fistula "does not appear to be working".  [id number], ext [phone].   Thurston Hole., LPN

## 2020-05-06 ENCOUNTER — Telehealth: Payer: Self-pay

## 2020-05-06 NOTE — Telephone Encounter (Signed)
Telephone call received from [place] at [facility] inquiring whether pts fistula was addressed at his last OV and that she has sent a medical records request. Advised medical records request is processed weekly by staff and per OV note:  -pt does not have evidence of steal. -the fistula/graft can be used 06/05/2020. -If pt has tunneled dialysis catheter and the access has been used successfully to the satisfaction of the dialysis center, the tunneled catheter can be removed at their discretion.   -the pt will follow up as needed for his dialysis access

## 2020-05-07 ENCOUNTER — Other Ambulatory Visit: Payer: Self-pay

## 2020-05-07 ENCOUNTER — Encounter: Payer: Self-pay | Admitting: Podiatry

## 2020-05-07 ENCOUNTER — Ambulatory Visit (INDEPENDENT_AMBULATORY_CARE_PROVIDER_SITE_OTHER): Payer: Medicare Other | Admitting: Podiatry

## 2020-05-07 DIAGNOSIS — E0843 Diabetes mellitus due to underlying condition with diabetic autonomic (poly)neuropathy: Secondary | ICD-10-CM | POA: Diagnosis not present

## 2020-05-07 DIAGNOSIS — M79675 Pain in left toe(s): Secondary | ICD-10-CM

## 2020-05-07 DIAGNOSIS — L97912 Non-pressure chronic ulcer of unspecified part of right lower leg with fat layer exposed: Secondary | ICD-10-CM | POA: Diagnosis not present

## 2020-05-07 DIAGNOSIS — M79674 Pain in right toe(s): Secondary | ICD-10-CM

## 2020-05-07 DIAGNOSIS — B351 Tinea unguium: Secondary | ICD-10-CM

## 2020-05-07 DIAGNOSIS — M14672 Charcot's joint, left ankle and foot: Secondary | ICD-10-CM

## 2020-05-07 MED ORDER — GENTAMICIN SULFATE 0.1 % EX CREA: 1.0000 "application " | TOPICAL_CREAM | Freq: Two times a day (BID) | CUTANEOUS | 1 refills | Status: AC

## 2020-05-07 NOTE — Progress Notes (Signed)
HPI: 64 y.o. male presenting today for routine foot care associated to his toenails.  Patient states that his toenails are elongated and thickened and painful with shoe gear.  He does have a history of Charcot neuroarthropathy to the left foot and ankle and wears a Higher education careers adviser daily.  Patient also states that recently he was admitted into the hospital and while in the hospital he developed an ulcer to the lateral aspect of the right leg.  He would like to have it checked out as well.  He has been applying Neosporin and a Band-Aid to it daily.  Past Medical History:  Diagnosis Date  . Alcohol abuse   . Congestive heart disease ([place]) 12/2011  . Diabetes mellitus   . GERD (gastroesophageal reflux disease)   . Gout   . Headache   . Hepatitis    being treated for Hepatitis C  . HOH (hard of hearing)    occasional wears hearing aids  . Hyperlipidemia   . Hyperlipidemia   . Hypertension   . Monoclonal gammopathies 02/25/2012  . Obesity   . Pedal edema   . Pneumonia   . Renal insufficiency    MWD - [facility]  . Sleep apnea    does not use cpap  . Tobacco abuse   . Wears hearing aid    b/l     Physical Exam: General: The patient is alert and oriented x3 in no acute distress.  Dermatology: Skin is warm, dry and supple bilateral lower extremities.  Hyperkeratotic dystrophic discolored nails noted 1-5 bilateral  Ulcer noted to the lateral aspect of the right ankle measuring approximately 4.0 x 2.0 x 0.3 cm.  To the noted ulceration there is no eschar.  There is a moderate amount of slough fibrin and necrotic tissue noted.  Wound base is intermittent granular and fibrotic tissue.  No malodor noted.  There is no exposed bone muscle tendon ligament or joint.  Periwound integrity is intact.  Vascular: Palpable pedal pulses bilaterally.  Heavy edema noted diffusely to the right foot and ankle. Capillary refill within normal limits.  Neurological: Epicritic and protective threshold absent  bilaterally.   Musculoskeletal Exam: Limited range of motion to the pedal and ankle joints of the foot.  Muscle strength 5/5 in all groups bilateral.  Negative for any significant pain on palpation.  H/O Charcot neuroarthropathy left foot  Assessment: 1.  Charcot neuroarthropathy left foot 2.  Diabetes mellitus W/peripheral polyneuropathy 3.  Pain due to onychomycosis of toenails bilateral 1-5 4.  Ulcer right ankle secondary to diabetes mellitus   Plan of Care:  1. Patient evaluated. 2. Continue CROW boot daily.  3.  Mechanical debridement of nails 1-5 bilateral was performed using a nail nipper without incident or bleeding 4.  Medically necessary excisional debridement including subcutaneous tissue was performed to the right lateral ankle ulceration using a tissue nipper.  Excisional debridement of all the necrotic nonviable tissue down to healthy bleeding viable tissue was performed with post debridement measurement same as pre- 5.  Prescription for gentamicin cream to be applied daily 6.  Referral placed for the patient to have the wound treated at the [facility] wound care center 7.  Return to clinic as needed  Having kidney surgery on 10/29/2019.    Edrick Kins, DPM [facility]  Dr. Edrick Kins, DPM    [address]  [place], [place] [address]                Office [phone]  Fax [phone]

## 2020-05-08 ENCOUNTER — Telehealth: Payer: Self-pay | Admitting: *Deleted

## 2020-05-08 DIAGNOSIS — E0843 Diabetes mellitus due to underlying condition with diabetic autonomic (poly)neuropathy: Secondary | ICD-10-CM

## 2020-05-08 DIAGNOSIS — L97912 Non-pressure chronic ulcer of unspecified part of right lower leg with fat layer exposed: Secondary | ICD-10-CM

## 2020-05-08 DIAGNOSIS — E1142 Type 2 diabetes mellitus with diabetic polyneuropathy: Secondary | ICD-10-CM

## 2020-05-08 NOTE — Telephone Encounter (Signed)
Faxed required form, clinical and demographics to Wound Care center.

## 2020-05-08 NOTE — Telephone Encounter (Signed)
-----   Message from Edrick Kins, DPM sent at 05/07/2020  6:47 PM EDT ----- Regarding: Wound care center referral Please refer patient to [facility] wound care center  Diagnosis: Right leg ulcer  Thanks, Dr. Amalia Hailey

## 2020-05-13 ENCOUNTER — Ambulatory Visit (INDEPENDENT_AMBULATORY_CARE_PROVIDER_SITE_OTHER): Payer: Self-pay | Admitting: Vascular Surgery

## 2020-05-13 ENCOUNTER — Encounter: Payer: Self-pay | Admitting: Vascular Surgery

## 2020-05-13 ENCOUNTER — Other Ambulatory Visit: Payer: Self-pay

## 2020-05-13 VITALS — BP 100/67 | HR 93 | Temp 97.8°F | Resp 20 | Ht 72.0 in | Wt 289.0 lb

## 2020-05-13 DIAGNOSIS — Z992 Dependence on renal dialysis: Secondary | ICD-10-CM

## 2020-05-13 DIAGNOSIS — N186 End stage renal disease: Secondary | ICD-10-CM

## 2020-05-13 NOTE — Progress Notes (Signed)
   Patient name: TREVONTE ASHKAR MRN: [id number] DOB: 24-Apr-1956 Sex: male  REASON FOR VISIT: Follow-up right arm basilic vein transposition fistula  HPI: SABASTIAN RAIMONDI is a 64 y.o. male here today for follow-up.  He underwent single-stage right basilic vein fistula by myself on 03/06/2020.  He undergoes hemodialysis at the [facility] clinic in [place].  Apparently there was some concern that he did not have flow through his fistula.  He is seen for further evaluation.  He denies any steal symptoms.  He says that he occasionally has burning sensation on the under aspect of his arm.  Current Outpatient Medications  Medication Sig Dispense Refill  . acetaminophen (TYLENOL) 325 MG tablet Take 650 mg by mouth every 6 (six) hours as needed for mild pain, moderate pain, fever or headache. Additional on Mon. Wed and Fri before dialysys    . allopurinol (ZYLOPRIM) 100 MG tablet Take 1 tablet (100 mg total) by mouth daily. (Patient taking differently: Take 100 mg by mouth 2 (two) times daily. ) 30 tablet 2  . amLODipine (NORVASC) 5 MG tablet Take 5 mg by mouth daily.    . carvedilol (COREG) 3.125 MG tablet Take 3.125 mg by mouth 2 (two) times daily.    . colchicine 0.6 MG tablet Take 0.6 mg by mouth daily.    . diphenhydrAMINE (BENADRYL) 25 MG tablet Take 25 mg by mouth every Monday, Wednesday, and Friday.    Marland Kitchen gentamicin cream (GARAMYCIN) 0.1 % Apply 1 application topically 2 (two) times daily. 30 g 1  . Insulin Detemir (LEVEMIR FLEXTOUCH) 100 UNIT/ML Pen Inject 20 Units into the skin daily.     . pravastatin (PRAVACHOL) 20 MG tablet Take 20 mg by mouth daily.      No current facility-administered medications for this visit.     PHYSICAL EXAM: Vitals:   05/13/20 0853  BP: 100/67  Pulse: 93  Resp: 20  Temp: 97.8 F (36.6 C)  SpO2: 96%  Weight: 289 lb (131.1 kg)  Height: 6' (1.829 m)    GENERAL: The patient is a well-nourished male, in no acute distress. The  vital signs are documented above. Excellent thrill and complete healing of all surgical incisions in his right arm.  He was concerned that positioning might occlude his fistula.  I did move his arms and multiple positions and he maintained an excellent thrill throughout.  MEDICAL ISSUES: Excellent result from basilic vein fistula.  Will be 3 months out another 3 weeks and should be able to use his fistula.  He reports that his catheter is still working without difficulty.  He does have a wound over his right lateral calf and is requested that we not evaluate this today.  He is had an eschar when seen by our physician assistant several weeks ago and reports this is separate in his healing and is seeing wound center.  He does have a dressing in place currently.  He will notify should he develop any new problems   Rosetta Posner, MD [facility] Tel [phone] Pager [phone]

## 2020-05-27 ENCOUNTER — Encounter (HOSPITAL_BASED_OUTPATIENT_CLINIC_OR_DEPARTMENT_OTHER): Payer: Medicare Other | Admitting: Internal Medicine

## 2020-06-05 ENCOUNTER — Other Ambulatory Visit (HOSPITAL_BASED_OUTPATIENT_CLINIC_OR_DEPARTMENT_OTHER): Payer: Self-pay | Admitting: Internal Medicine

## 2020-06-05 ENCOUNTER — Encounter (HOSPITAL_BASED_OUTPATIENT_CLINIC_OR_DEPARTMENT_OTHER): Payer: Medicare Other | Attending: Internal Medicine | Admitting: Internal Medicine

## 2020-06-05 DIAGNOSIS — Z992 Dependence on renal dialysis: Secondary | ICD-10-CM | POA: Insufficient documentation

## 2020-06-05 DIAGNOSIS — E1122 Type 2 diabetes mellitus with diabetic chronic kidney disease: Secondary | ICD-10-CM | POA: Diagnosis not present

## 2020-06-05 DIAGNOSIS — L89310 Pressure ulcer of right buttock, unstageable: Secondary | ICD-10-CM | POA: Insufficient documentation

## 2020-06-05 DIAGNOSIS — I132 Hypertensive heart and chronic kidney disease with heart failure and with stage 5 chronic kidney disease, or end stage renal disease: Secondary | ICD-10-CM | POA: Diagnosis not present

## 2020-06-05 DIAGNOSIS — L97822 Non-pressure chronic ulcer of other part of left lower leg with fat layer exposed: Secondary | ICD-10-CM | POA: Diagnosis not present

## 2020-06-05 DIAGNOSIS — N186 End stage renal disease: Secondary | ICD-10-CM | POA: Diagnosis not present

## 2020-06-05 DIAGNOSIS — I509 Heart failure, unspecified: Secondary | ICD-10-CM | POA: Insufficient documentation

## 2020-06-05 DIAGNOSIS — L97212 Non-pressure chronic ulcer of right calf with fat layer exposed: Secondary | ICD-10-CM | POA: Insufficient documentation

## 2020-06-05 DIAGNOSIS — I87331 Chronic venous hypertension (idiopathic) with ulcer and inflammation of right lower extremity: Secondary | ICD-10-CM | POA: Diagnosis not present

## 2020-06-05 DIAGNOSIS — Z8619 Personal history of other infectious and parasitic diseases: Secondary | ICD-10-CM | POA: Insufficient documentation

## 2020-06-05 DIAGNOSIS — Z87891 Personal history of nicotine dependence: Secondary | ICD-10-CM | POA: Insufficient documentation

## 2020-06-05 DIAGNOSIS — E785 Hyperlipidemia, unspecified: Secondary | ICD-10-CM | POA: Insufficient documentation

## 2020-06-05 DIAGNOSIS — E1136 Type 2 diabetes mellitus with diabetic cataract: Secondary | ICD-10-CM | POA: Insufficient documentation

## 2020-06-05 DIAGNOSIS — E11621 Type 2 diabetes mellitus with foot ulcer: Secondary | ICD-10-CM | POA: Diagnosis not present

## 2020-06-05 DIAGNOSIS — L929 Granulomatous disorder of the skin and subcutaneous tissue, unspecified: Secondary | ICD-10-CM | POA: Diagnosis not present

## 2020-06-05 DIAGNOSIS — I872 Venous insufficiency (chronic) (peripheral): Secondary | ICD-10-CM | POA: Diagnosis not present

## 2020-06-05 NOTE — Progress Notes (Signed)
JIBRAN, CROOKSHANKS ([id number]) Visit Report for 06/05/2020 Allergy List Details Patient Name: Date of Service: MEL, LANGAN 06/05/2020 2:45 PM Medical Record Number: [id number] Patient Account Number: [id number] Date of Birth/Sex: Treating RN: 11-Oct-1956 (64 y.o. Marvis Repress Primary Care Stephene Alegria: [name] Other Clinician: Referring Riannah Stagner: Treating Billi Bright/Extender: Randon Goldsmith in Treatment: 0 Allergies Active Allergies penicillin propolis (bee glue) Vioxx Allergy Notes Electronic Signature(s) Signed: 06/05/2020 4:44:31 PM By: Kela Millin Entered By: Kela Millin on 06/05/2020 14:09:34 -------------------------------------------------------------------------------- Arrival Information Details Patient Name: Date of Service: Ladona Mow 06/05/2020 2:45 PM Medical Record Number: [id number] Patient Account Number: [id number] Date of Birth/Sex: Treating RN: [birth year] (64 y.o. Marvis Repress Primary Care Jorgina Binning: [name] Other Clinician: Referring Riot Barrick: Treating Kailly Richoux/Extender: Randon Goldsmith in Treatment: 0 Visit Information Patient Arrived: Ambulatory Arrival Time: 14:04 Accompanied By: self Transfer Assistance: None Patient Identification Verified: Yes Secondary Verification Process Completed: Yes Patient Has Alerts: Yes Patient Alerts: Right ABI/TBI: 1.35 Electronic Signature(s) Signed: 06/05/2020 4:44:31 PM By: Kela Millin Entered By: Kela Millin on 06/05/2020 14:36:09 -------------------------------------------------------------------------------- Clinic Level of Care Assessment Details Patient Name: Date of Service: JEREMY, DITULLIO 06/05/2020 2:45 PM Medical Record Number: [id number] Patient Account Number: [id number] Date of Birth/Sex: Treating RN: 1956-03-05 (64 y.o. Janyth Contes Primary Care Brynlie Daza: [name] Other Clinician: Referring  Ivory Bail: Treating Lorren Splawn/Extender: Randon Goldsmith in Treatment: 0 Clinic Level of Care Assessment Items TOOL 1 Quantity Score X- 1 0 Use when EandM and Procedure is performed on INITIAL visit ASSESSMENTS - Nursing Assessment / Reassessment X- 1 20 General Physical Exam (combine w/ comprehensive assessment (listed just below) when performed on new pt. evals) X- 1 25 Comprehensive Assessment (HX, ROS, Risk Assessments, Wounds Hx, etc.) ASSESSMENTS - Wound and Skin Assessment / Reassessment []  - 0 Dermatologic / Skin Assessment (not related to wound area) ASSESSMENTS - Ostomy and/or Continence Assessment and Care []  - 0 Incontinence Assessment and Management []  - 0 Ostomy Care Assessment and Management (repouching, etc.) PROCESS - Coordination of Care X - Simple Patient / Family Education for ongoing care 1 15 []  - 0 Complex (extensive) Patient / Family Education for ongoing care X- 1 10 Staff obtains Programmer, systems, Records, T Results / Process Orders est []  - 0 Staff telephones HHA, Nursing Homes / Clarify orders / etc []  - 0 Routine Transfer to another Facility (non-emergent condition) []  - 0 Routine Hospital Admission (non-emergent condition) X- 1 15 New Admissions / Biomedical engineer / Ordering NPWT Apligraf, etc. , []  - 0 Emergency Hospital Admission (emergent condition) PROCESS - Special Needs []  - 0 Pediatric / Minor Patient Management []  - 0 Isolation Patient Management []  - 0 Hearing / Language / Visual special needs []  - 0 Assessment of Community assistance (transportation, D/C planning, etc.) []  - 0 Additional assistance / Altered mentation []  - 0 Support Surface(s) Assessment (bed, cushion, seat, etc.) INTERVENTIONS - Miscellaneous []  - 0 External ear exam []  - 0 Patient Transfer (multiple staff / Civil Service fast streamer / Similar devices) []  - 0 Simple Staple / Suture removal (25 or less) []  - 0 Complex Staple / Suture removal (26 or  more) []  - 0 Hypo/Hyperglycemic Management (do not check if billed separately) []  - 0 Ankle / Brachial Index (ABI) - do not check if billed separately Has the patient been seen at the hospital within the last three years: Yes Total Score: 85 Level Of Care: New/Established - Level 3 Electronic  Signature(s) Signed: 06/05/2020 5:02:14 PM By: Levan Hurst RN, BSN Signed: 06/05/2020 5:02:14 PM By: Levan Hurst RN, BSN Entered By: Levan Hurst on 06/05/2020 15:28:58 -------------------------------------------------------------------------------- Encounter Discharge Information Details Patient Name: Date of Service: LINARD, DAFT 06/05/2020 2:45 PM Medical Record Number: [id number] Patient Account Number: [id number] Date of Birth/Sex: Treating RN: 1955/11/16 (64 y.o. Ernestene Mention Primary Care Jerren Flinchbaugh: [name] Other Clinician: Referring Shalice Woodring: Treating Doloros Kwolek/Extender: Randon Goldsmith in Treatment: 0 Encounter Discharge Information Items Post Procedure Vitals Discharge Condition: Stable Temperature (F): 97.6 Ambulatory Status: Ambulatory Pulse (bpm): 96 Discharge Destination: Home Respiratory Rate (breaths/min): 18 Transportation: Private Auto Blood Pressure (mmHg): 141/76 Accompanied By: self Schedule Follow-up Appointment: Yes Clinical Summary of Care: Patient Declined Electronic Signature(s) Signed: 06/05/2020 4:56:58 PM By: Baruch Gouty RN, BSN Entered By: Baruch Gouty on 06/05/2020 15:30:28 -------------------------------------------------------------------------------- Lower Extremity Assessment Details Patient Name: Date of Service: Ladona Mow. 06/05/2020 2:45 PM Medical Record Number: [id number] Patient Account Number: [id number] Date of Birth/Sex: Treating RN: 04-Dec-1955 (64 y.o. Marvis Repress Primary Care Britini Garcilazo: [name] Other Clinician: Referring Daejah Klebba: Treating Zenith Lamphier/Extender: Randon Goldsmith in Treatment: 0 Edema Assessment Assessed: Shirlyn Goltz: No] Patrice Paradise: No] Edema: [Left: Ye] [Right: s] Calf Left: Right: Point of Measurement: 32 cm From Medial Instep cm 40 cm Ankle Left: Right: Point of Measurement: 14 cm From Medial Instep cm 27 cm Vascular Assessment Pulses: Dorsalis Pedis Palpable: [Right:No] Electronic Signature(s) Signed: 06/05/2020 4:44:31 PM By: Kela Millin Entered By: Kela Millin on 06/05/2020 14:17:51 -------------------------------------------------------------------------------- Multi Wound Chart Details Patient Name: Date of Service: Ladona Mow. 06/05/2020 2:45 PM Medical Record Number: [id number] Patient Account Number: [id number] Date of Birth/Sex: Treating RN: 1956-02-13 (64 y.o. Janyth Contes Primary Care Bonnie Overdorf: [name] Other Clinician: Referring Shyleigh Daughtry: Treating Jacion Dismore/Extender: Randon Goldsmith in Treatment: 0 Vital Signs Height(in): 31 Pulse(bpm): 92 Weight(lbs): 250 Blood Pressure(mmHg): 141/76 Body Mass Index(BMI): 33 Temperature(F): 97.6 Respiratory Rate(breaths/min): 19 Photos: [1:No Photos Right, Lateral Lower Leg] [2:No Photos Right, Proximal Gluteus] [3:No Photos Right, Distal Gluteus] Wound Location: [1:Gradually Appeared] [2:Gradually Appeared] [3:Gradually Appeared] Wounding Event: [1:Venous Leg Ulcer] [2:Pressure Ulcer] [3:Pressure Ulcer] Primary Etiology: [1:Cataracts, Sleep Apnea, Congestive] [2:Cataracts, Sleep Apnea, Congestive] [3:Cataracts, Sleep Apnea, Congestive] Comorbid History: [1:Heart Failure, Hypertension, Hepatitis C, Type II Diabetes, End Stage Renal Disease, Neuropathy 01/23/2020] [2:Heart Failure, Hypertension, Hepatitis C, Type II Diabetes, End Stage Renal Disease, Neuropathy 03/15/2020] [3:Heart Failure,  Hypertension, Hepatitis C, Type II Diabetes, End Stage Renal Disease, Neuropathy 03/15/2020] Date Acquired: [1:0] [2:0] [3:0] Weeks  of Treatment: [1:Open] [2:Open] [3:Open] Wound Status: [1:2x0.9x0.1] [2:0.5x0.6x0.1] [3:5.8x5.1x2.1] Measurements L x W x D (cm) [1:1.414] [2:0.236] [3:23.232] A (cm) : rea [1:0.141] [2:0.024] [3:48.787] Volume (cm) : [1:N/A] [2:0.00%] [3:0.00%] % Reduction in Area: [1:N/A] [2:0.00%] [3:0.00%] % Reduction in Volume: [1:Full Thickness Without Exposed] [2:Unstageable/Unclassified] [3:Unstageable/Unclassified] Classification: [1:Support Structures Medium] [2:Medium] [3:Medium] Exudate A mount: [1:Serosanguineous] [2:Serosanguineous] [3:Serosanguineous] Exudate Type: [1:red, brown] [2:red, brown] [3:red, brown] Exudate Color: [1:Distinct, outline attached] [2:Distinct, outline attached] [3:Well defined, not attached] Wound Margin: [1:Medium (34-66%)] [2:None Present (0%)] [3:Small (1-33%)] Granulation Amount: [1:Red, Pink] [2:N/A] [3:Pink] Granulation Quality: [1:Medium (34-66%)] [2:Large (67-100%)] [3:Large (67-100%)] Necrotic Amount: [1:Adherent Slough] [2:Eschar, Adherent Slough] [3:Eschar, Adherent Slough] Necrotic Tissue: [1:Fat Layer (Subcutaneous Tissue)] [2:Fat Layer (Subcutaneous Tissue)] [3:N/A] Exposed Structures: [1:Exposed: Yes Fascia: No Tendon: No Muscle: No Joint: No Bone: No None] [2:Exposed: Yes Fascia: No Tendon: No Muscle: No Joint: No Bone: No None] [3:None] Epithelialization: [1:Debridement - Excisional] [2:Chemical/Enzymatic/Mechanical -] [  3:Debridement - Excisional] Debridement: [1:14:38] [2:Excisional 14:38] [3:14:38] Pre-procedure Verification/Time Out Taken: [1:Subcutaneous, Slough] [2:Subcutaneous, Slough] [3:Subcutaneous, Slough] Tissue Debrided: [1:Skin/Subcutaneous Tissue] [2:N/A] [3:Skin/Subcutaneous Tissue] Level: [1:1.8] [2:N/A] [3:29.58] Debridement A (sq cm): [1:rea Curette] [2:Curette] [3:Curette] Instrument: [1:Minimum] [2:Minimum] [3:Minimum] Bleeding: [1:Pressure] [2:Pressure] [3:Pressure] Hemostasis A chieved: [1:0] [2:0] [3:0] Procedural Pain:  [1:0] [2:0] [3:0] Post Procedural Pain: [1:Procedure was tolerated well] [2:Procedure was tolerated well] [3:Procedure was tolerated well] Debridement Treatment Response: [1:2x0.9x0.1] [2:0.5x0.6x0.1] [3:5.8x5.1x2.1] Post Debridement Measurements L x W x D (cm) [1:0.141] [2:0.024] [3:48.787] Post Debridement Volume: (cm) [1:Debridement] [2:Debridement] [3:Biopsy] Procedures Performed: [3:Debridement] Treatment Notes Electronic Signature(s) Signed: 06/05/2020 5:02:14 PM By: Levan Hurst RN, BSN Signed: 06/05/2020 5:29:29 PM By: Linton Ham MD Entered By: Linton Ham on 06/05/2020 15:02:20 -------------------------------------------------------------------------------- Multi-Disciplinary Care Plan Details Patient Name: Date of Service: ARN, MCOMBER 06/05/2020 2:45 PM Medical Record Number: [id number] Patient Account Number: [id number] Date of Birth/Sex: Treating RN: 11-24-1955 (64 y.o. Janyth Contes Primary Care Maxfield Gildersleeve: [name] Other Clinician: Referring [facility] Cortez: Treating Johnny Latu/Extender: Randon Goldsmith in Treatment: 0 Active Inactive Abuse / Safety / Falls / Self Care Management Nursing Diagnoses: Potential for falls Potential for injury related to falls Goals: Patient will remain injury free related to falls Date Initiated: 06/05/2020 Target Resolution Date: 07/04/2020 Goal Status: Active Patient/caregiver will verbalize/demonstrate measures taken to prevent injury and/or falls Date Initiated: 06/05/2020 Target Resolution Date: 07/04/2020 Goal Status: Active Interventions: Assess Activities of Daily Living upon admission and as needed Assess fall risk on admission and as needed Assess: immobility, friction, shearing, incontinence upon admission and as needed Assess impairment of mobility on admission and as needed per policy Assess personal safety and home safety (as indicated) on admission and as needed Assess self care needs  on admission and as needed Provide education on fall prevention Provide education on personal and home safety Notes: Nutrition Nursing Diagnoses: Impaired glucose control: actual or potential Potential for alteratiion in Nutrition/Potential for imbalanced nutrition Goals: Patient/caregiver agrees to and verbalizes understanding of need to use nutritional supplements and/or vitamins as prescribed Date Initiated: 06/05/2020 Target Resolution Date: 07/04/2020 Goal Status: Active Patient/caregiver will maintain therapeutic glucose control Date Initiated: 06/05/2020 Target Resolution Date: 07/04/2020 Goal Status: Active Interventions: Assess HgA1c results as ordered upon admission and as needed Assess patient nutrition upon admission and as needed per policy Provide education on elevated blood sugars and impact on wound healing Provide education on nutrition Notes: Wound/Skin Impairment Nursing Diagnoses: Impaired tissue integrity Knowledge deficit related to ulceration/compromised skin integrity Goals: Patient/caregiver will verbalize understanding of skin care regimen Date Initiated: 06/05/2020 Target Resolution Date: 07/04/2020 Goal Status: Active Interventions: Assess patient/caregiver ability to obtain necessary supplies Assess patient/caregiver ability to perform ulcer/skin care regimen upon admission and as needed Assess ulceration(s) every visit Provide education on ulcer and skin care Notes: Electronic Signature(s) Signed: 06/05/2020 5:02:14 PM By: Levan Hurst RN, BSN Entered By: Levan Hurst on 06/05/2020 14:39:23 -------------------------------------------------------------------------------- Pain Assessment Details Patient Name: Date of Service: Ladona Mow 06/05/2020 2:45 PM Medical Record Number: [id number] Patient Account Number: [id number] Date of Birth/Sex: Treating RN: 1956/04/27 (64 y.o. Marvis Repress Primary Care Keats Kingry: [name] Other  Clinician: Referring Marguerette Sheller: Treating Anette Barra/Extender: Randon Goldsmith in Treatment: 0 Active Problems Location of Pain Severity and Description of Pain Patient Has Paino Yes Site Locations Rate the pain. Current Pain Level: 3 Worst Pain Level: 7 Least Pain Level: 3 Tolerable Pain Level: 4 Character of Pain Describe the Pain: Aching Pain Management  and Medication Current Pain Management: Electronic Signature(s) Signed: 06/05/2020 4:44:31 PM By: Kela Millin Entered By: Kela Millin on 06/05/2020 14:27:11 -------------------------------------------------------------------------------- Patient/Caregiver Education Details Patient Name: Date of Service: Ladona Mow 7/22/2021andnbsp2:45 PM Medical Record Number: [id number] Patient Account Number: [id number] Date of Birth/Gender: Treating RN: 1956/07/10 (64 y.o. Janyth Contes Primary Care Physician: [name] Other Clinician: Referring Physician: Treating Physician/Extender: Randon Goldsmith in Treatment: 0 Education Assessment Education Provided To: Patient Education Topics Provided Elevated Blood Sugar/ Impact on Healing: Methods: Explain/Verbal Responses: State content correctly Nutrition: Methods: Explain/Verbal Responses: State content correctly Safety: Methods: Explain/Verbal Responses: State content correctly Wound/Skin Impairment: Methods: Explain/Verbal Responses: State content correctly Electronic Signature(s) Signed: 06/05/2020 5:02:14 PM By: Levan Hurst RN, BSN Entered By: Levan Hurst on 06/05/2020 15:28:10 -------------------------------------------------------------------------------- Wound Assessment Details Patient Name: Date of Service: Ladona Mow 06/05/2020 2:45 PM Medical Record Number: [id number] Patient Account Number: [id number] Date of Birth/Sex: Treating RN: 1956/04/06 (64 y.o. Marvis Repress Primary Care  Bea Duren: [name] Other Clinician: Referring Carlitos Bottino: Treating Afomia Blackley/Extender: Randon Goldsmith in Treatment: 0 Wound Status Wound Number: 1 Primary Venous Leg Ulcer Etiology: Wound Location: Right, Lateral Lower Leg Wound Open Wounding Event: Gradually Appeared Status: Date Acquired: 01/23/2020 Comorbid Cataracts, Sleep Apnea, Congestive Heart Failure, Hypertension, Weeks Of Treatment: 0 History: Hepatitis C, Type II Diabetes, End Stage Renal Disease, Clustered Wound: No Neuropathy Wound Measurements Length: (cm) 2 Width: (cm) 0.9 Depth: (cm) 0.1 Area: (cm) 1.414 Volume: (cm) 0.141 % Reduction in Area: % Reduction in Volume: Epithelialization: None Tunneling: No Undermining: No Wound Description Classification: Full Thickness Without Exposed Support Structures Wound Margin: Distinct, outline attached Exudate Amount: Medium Exudate Type: Serosanguineous Exudate Color: red, brown Foul Odor After Cleansing: No Slough/Fibrino Yes Wound Bed Granulation Amount: Medium (34-66%) Exposed Structure Granulation Quality: Red, Pink Fascia Exposed: No Necrotic Amount: Medium (34-66%) Fat Layer (Subcutaneous Tissue) Exposed: Yes Necrotic Quality: Adherent Slough Tendon Exposed: No Muscle Exposed: No Joint Exposed: No Bone Exposed: No Treatment Notes Wound #1 (Right, Lateral Lower Leg) 3. Primary Dressing Applied Calcium Alginate Ag 4. Secondary Dressing Foam Border Dressing Electronic Signature(s) Signed: 06/05/2020 4:44:31 PM By: Kela Millin Entered By: Kela Millin on 06/05/2020 14:19:46 -------------------------------------------------------------------------------- Wound Assessment Details Patient Name: Date of Service: EARMON, SHERROW 06/05/2020 2:45 PM Medical Record Number: [id number] Patient Account Number: [id number] Date of Birth/Sex: Treating RN: 1956/01/21 (64 y.o. Marvis Repress Primary Care Srikar Chiang:  [name] Other Clinician: Referring Philbert Ocallaghan: Treating Lisabeth Mian/Extender: Randon Goldsmith in Treatment: 0 Wound Status Wound Number: 2 Primary Pressure Ulcer Etiology: Wound Location: Right, Proximal Gluteus Wound Open Wounding Event: Gradually Appeared Status: Date Acquired: 03/15/2020 Comorbid Cataracts, Sleep Apnea, Congestive Heart Failure, Hypertension, Weeks Of Treatment: 0 History: Hepatitis C, Type II Diabetes, End Stage Renal Disease, Clustered Wound: No Neuropathy Wound Measurements Length: (cm) 0.5 Width: (cm) 0.6 Depth: (cm) 0.1 Area: (cm) 0.236 Volume: (cm) 0.024 % Reduction in Area: 0% % Reduction in Volume: 0% Epithelialization: None Tunneling: No Undermining: No Wound Description Classification: Unstageable/Unclassified Wound Margin: Distinct, outline attached Exudate Amount: Medium Exudate Type: Serosanguineous Exudate Color: red, brown Foul Odor After Cleansing: No Slough/Fibrino Yes Wound Bed Granulation Amount: None Present (0%) Exposed Structure Necrotic Amount: Large (67-100%) Fascia Exposed: No Necrotic Quality: Eschar, Adherent Slough Fat Layer (Subcutaneous Tissue) Exposed: Yes Tendon Exposed: No Muscle Exposed: No Joint Exposed: No Bone Exposed: No Electronic Signature(s) Signed: 06/05/2020 4:44:31 PM By: Kela Millin Signed: 06/05/2020 5:02:14 PM By: Donnal Debar  Briant Cedar RN, BSN Entered By: Levan Hurst on 06/05/2020 15:00:51 -------------------------------------------------------------------------------- Wound Assessment Details Patient Name: Date of Service: CONLIN, BRAHM 06/05/2020 2:45 PM Medical Record Number: [id number] Patient Account Number: [id number] Date of Birth/Sex: Treating RN: 1956/09/28 (64 y.o. Marvis Repress Primary Care Alisi Lupien: [name] Other Clinician: Referring Sayed Apostol: Treating Marthena Whitmyer/Extender: Randon Goldsmith in Treatment: 0 Wound  Status Wound Number: 3 Primary Pressure Ulcer Etiology: Wound Location: Right, Distal Gluteus Wound Open Wounding Event: Gradually Appeared Status: Date Acquired: 03/15/2020 Comorbid Cataracts, Sleep Apnea, Congestive Heart Failure, Hypertension, Weeks Of Treatment: 0 History: Hepatitis C, Type II Diabetes, End Stage Renal Disease, Clustered Wound: No Neuropathy Wound Measurements Length: (cm) 5.8 Width: (cm) 5.1 Depth: (cm) 2.1 Area: (cm) 23.232 Volume: (cm) 48.787 % Reduction in Area: 0% % Reduction in Volume: 0% Epithelialization: None Tunneling: No Undermining: No Wound Description Classification: Unstageable/Unclassified Wound Margin: Well defined, not attached Exudate Amount: Medium Exudate Type: Serosanguineous Exudate Color: red, brown Foul Odor After Cleansing: No Slough/Fibrino Yes Wound Bed Granulation Amount: Small (1-33%) Granulation Quality: Pink Necrotic Amount: Large (67-100%) Necrotic Quality: Eschar, Adherent Slough Treatment Notes Wound #3 (Right, Distal Gluteus) 2. Periwound Care Skin Prep 3. Primary Dressing Applied Santyl 4. Secondary Dressing Foam Border Dressing Electronic Signature(s) Signed: 06/05/2020 4:44:31 PM By: Kela Millin Signed: 06/05/2020 5:02:14 PM By: Levan Hurst RN, BSN Signed: 06/05/2020 5:02:14 PM By: Levan Hurst RN, BSN Entered By: Levan Hurst on 06/05/2020 15:01:31 -------------------------------------------------------------------------------- Vitals Details Patient Name: Date of Service: CORGAN, MORMILE 06/05/2020 2:45 PM Medical Record Number: [id number] Patient Account Number: [id number] Date of Birth/Sex: Treating RN: 1956/06/28 (64 y.o. Marvis Repress Primary Care Giovanni Biby: [name] Other Clinician: Referring Alisah Grandberry: Treating Qiara Minetti/Extender: Randon Goldsmith in Treatment: 0 Vital Signs Time Taken: 14:00 Temperature (F): 97.6 Height (in): 73 Pulse (bpm):  96 Source: Stated Respiratory Rate (breaths/min): 19 Weight (lbs): 250 Blood Pressure (mmHg): 141/76 Source: Stated Reference Range: 80 - 120 mg / dl Body Mass Index (BMI): 33 Notes patient did not check CBG this morning Electronic Signature(s) Signed: 06/05/2020 4:44:31 PM By: Kela Millin Entered By: Kela Millin on 06/05/2020 14:08:46

## 2020-06-05 NOTE — Progress Notes (Signed)
TARAY, NORMOYLE ([id number]) Visit Report for 06/05/2020 Biopsy Details Patient Name: Date of Service: [name], [name] 06/05/2020 2:45 PM Medical Record Number: [id number] Patient Account Number: [id number] Date of Birth/Sex: Treating RN: Nov 06, 1956 (64 y.o. Janyth Contes Primary Care Provider: [name] Other Clinician: Referring Provider: Treating Provider/Extender: Randon Goldsmith in Treatment: 0 Biopsy Performed for: Wound #3 Right, Distal Gluteus Location(s): Wound Margin Performed By: Physician Ricard Dillon., MD Tissue Punch: Yes Size (mm): 4 Number of Specimens T aken: 2 Specimen Sent T Pathology: o Yes Level of Consciousness (Pre-procedure): Awake and Alert Pre-procedure Verification/Time-Out Taken: Yes - 14:50 Pain Control: Lidocaine Injectable Lidocaine Percent: 1% Instrument: Forceps Bleeding: Moderate Hemostasis Achieved: Silver Nitrate Procedural Pain: 0 Post Procedural Pain: 0 Response to Treatment: Procedure was tolerated well Level of Consciousness (Post-procedure): Awake and Alert Post Procedure Diagnosis Same as Pre-procedure Electronic Signature(s) Signed: 06/05/2020 5:29:29 PM By: Linton Ham MD Entered By: Linton Ham on 06/05/2020 15:26:05 -------------------------------------------------------------------------------- Chief Complaint Document Details Patient Name: Date of Service: [name]. 06/05/2020 2:45 PM Medical Record Number: [id number] Patient Account Number: [id number] Date of Birth/Sex: Treating RN: 09/01/1956 (64 y.o. Janyth Contes Primary Care Provider: [name] Other Clinician: Referring Provider: Treating Provider/Extender: Randon Goldsmith in Treatment: 0 Information Obtained from: Patient Chief Complaint 06/05/2020; patient I think was referred from podiatry for a wound on his right lateral lower leg. Almost as an after thought he mentioned that he has an  area on his right gluteal area. We also looked at this Electronic Signature(s) Signed: 06/05/2020 5:29:29 PM By: Linton Ham MD Entered By: Linton Ham on 06/05/2020 15:13:31 -------------------------------------------------------------------------------- Debridement Details Patient Name: Date of Service: [name] 06/05/2020 2:45 PM Medical Record Number: [id number] Patient Account Number: [id number] Date of Birth/Sex: Treating RN: Jul 04, 1956 (64 y.o. Janyth Contes Primary Care Provider: [name] Other Clinician: Referring Provider: Treating Provider/Extender: Randon Goldsmith in Treatment: 0 Debridement Performed for Assessment: Wound #2 Right,Proximal Gluteus Performed By: Clinician Levan Hurst, RN Debridement Type: Chemical/Enzymatic/Mechanical Agent Used: Santyl Level of Consciousness (Pre-procedure): Awake and Alert Pre-procedure Verification/Time Out Yes - 14:38 Taken: Start Time: 14:38 Instrument: Curette Bleeding: Minimum Hemostasis Achieved: Pressure End Time: 14:39 Procedural Pain: 0 Post Procedural Pain: 0 Response to Treatment: Procedure was tolerated well Level of Consciousness (Post- Awake and Alert procedure): Post Debridement Measurements of Total Wound Length: (cm) 0.5 Width: (cm) 0.6 Depth: (cm) 0.1 Volume: (cm) 0.024 Character of Wound/Ulcer Post Debridement: Requires Further Debridement Post Procedure Diagnosis Same as Pre-procedure Electronic Signature(s) Signed: 06/05/2020 5:02:14 PM By: Levan Hurst RN, BSN Signed: 06/05/2020 5:29:29 PM By: Linton Ham MD Entered By: Levan Hurst on 06/05/2020 14:54:57 -------------------------------------------------------------------------------- Debridement Details Patient Name: Date of Service: [name]. 06/05/2020 2:45 PM Medical Record Number: [id number] Patient Account Number: [id number] Date of Birth/Sex: Treating RN: 1955/12/26 (64 y.o. Janyth Contes Primary Care Provider: [name] Other Clinician: Referring Provider: Treating Provider/Extender: Randon Goldsmith in Treatment: 0 Debridement Performed for Assessment: Wound #1 Right,Lateral Lower Leg Performed By: Physician Ricard Dillon., MD Debridement Type: Debridement Severity of Tissue Pre Debridement: Fat layer exposed Level of Consciousness (Pre-procedure): Awake and Alert Pre-procedure Verification/Time Out Yes - 14:38 Taken: Start Time: 14:38 T Area Debrided (L x W): otal 2 (cm) x 0.9 (cm) = 1.8 (cm) Tissue and other material debrided: Viable, Non-Viable, Slough, Subcutaneous, Slough Level: Skin/Subcutaneous Tissue Debridement Description: Excisional Instrument: Curette Bleeding: Minimum Hemostasis  Achieved: Pressure End Time: 14:39 Procedural Pain: 0 Post Procedural Pain: 0 Response to Treatment: Procedure was tolerated well Level of Consciousness (Post- Awake and Alert procedure): Post Debridement Measurements of Total Wound Length: (cm) 2 Width: (cm) 0.9 Depth: (cm) 0.1 Volume: (cm) 0.141 Character of Wound/Ulcer Post Debridement: Improved Severity of Tissue Post Debridement: Fat layer exposed Post Procedure Diagnosis Same as Pre-procedure Electronic Signature(s) Signed: 06/05/2020 5:02:14 PM By: Levan Hurst RN, BSN Signed: 06/05/2020 5:29:29 PM By: Linton Ham MD Entered By: Linton Ham on 06/05/2020 15:02:40 -------------------------------------------------------------------------------- Debridement Details Patient Name: Date of Service: [name]. 06/05/2020 2:45 PM Medical Record Number: [id number] Patient Account Number: [id number] Date of Birth/Sex: Treating RN: Apr 29, 1956 (64 y.o. Janyth Contes Primary Care Provider: [name] Other Clinician: Referring Provider: Treating Provider/Extender: Randon Goldsmith in Treatment: 0 Debridement Performed for  Assessment: Wound #3 Right,Distal Gluteus Performed By: Clinician Levan Hurst, RN Debridement Type: Chemical/Enzymatic/Mechanical Agent Used: Santyl Level of Consciousness (Pre-procedure): Awake and Alert Pre-procedure Verification/Time Out Yes - 14:38 Taken: Start Time: 14:38 Bleeding: Minimum Hemostasis Achieved: Pressure End Time: 14:39 Procedural Pain: 0 Post Procedural Pain: 0 Response to Treatment: Procedure was tolerated well Level of Consciousness (Post- Awake and Alert procedure): Post Debridement Measurements of Total Wound Length: (cm) 5.8 Width: (cm) 5.1 Depth: (cm) 2.1 Volume: (cm) 48.787 Character of Wound/Ulcer Post Debridement: Improved Post Procedure Diagnosis Same as Pre-procedure Electronic Signature(s) Signed: 06/05/2020 5:02:14 PM By: Levan Hurst RN, BSN Signed: 06/05/2020 5:29:29 PM By: Linton Ham MD Entered By: Levan Hurst on 06/05/2020 15:07:29 -------------------------------------------------------------------------------- HPI Details Patient Name: Date of Service: [name]. 06/05/2020 2:45 PM Medical Record Number: [id number] Patient Account Number: [id number] Date of Birth/Sex: Treating RN: 06/09/1956 (64 y.o. Janyth Contes Primary Care Provider: [name] Other Clinician: Referring Provider: Treating Provider/Extender: Randon Goldsmith in Treatment: 0 History of Present Illness HPI Description: ADMISSION 06/05/2020 This is a 64 year old man who has been on dialysis secondary to diabetes for about 5 years. He was recently seen by Dr. Amalia Hailey I think for routine and prophylactic nail care and foot care. He mentioned that he had in this area on the right lateral leg and he was referred here for review of this. He has been using gentamicin cream on the wound. He also uses a cam boot for Charcot ankle. He tells me that he does not have a wound on his left foot or ankle. Almost as a side he mentioned that he  had a wound on the right gluteal area. He states that this wound had been present since he was hospitalized on 2 different occasions in March. Firstly from 2/28 through 3/6 for Covid and strep bacteremia. He was rehospitalized from 3/9 through 3/12 secondary to hypotension and hypothermia. It was notable during that admission that he had a wound on the right lateral leg they did not mention the area on the right gluteal. In any case the patient is using gentamicin on this area as well his wife changes the dressing. Past medical history; end-stage renal disease on dialysis secondary to type 2 diabetes, hypertension, hyperlipidemia Charcot ankle on the left foot and ankle, he is an ex-smoker 10 years ago remote history of hepatitis C and congestive heart failure. The patient had vascular studies on 05/01/2020 this showed ABIs on the right at 2.06 but with triphasic and biphasic waveforms his TBI was actually elevated at 1.25. He did not allow the cam boot to be removed and therefore  arterial studies on the left were not done Electronic Signature(s) Signed: 06/05/2020 5:29:29 PM By: Linton Ham MD Entered By: Linton Ham on 06/05/2020 15:17:45 -------------------------------------------------------------------------------- Physical Exam Details Patient Name: Date of Service: [name]. 06/05/2020 2:45 PM Medical Record Number: [id number] Patient Account Number: [id number] Date of Birth/Sex: Treating RN: [birth year] (64 y.o. Janyth Contes Primary Care Provider: [name] Other Clinician: Referring Provider: Treating Provider/Extender: Randon Goldsmith in Treatment: 0 Constitutional Patient is hypertensive.. Pulse regular and within target range for patient.Marland Kitchen Respirations regular, non-labored and within target range.. Temperature is normal and within the target range for the patient.Marland Kitchen Appears in no distress. Respiratory work of breathing is  normal. Cardiovascular Femoral and popliteal pulses were palpable on the right. Pedal pulses were nonpalpable in the right foot either dorsalis pedis or posterior tibial. Edema in the right lower leg which is nonpitting. Skin discoloration looking like hemosiderin deposition. Notes Wound exam; On the right lateral lower leg just above the ankle. A small benign looking wound. Debris on the surface debrided with a #3 curette he has rims of epithelialization. This cleaned up quite nicely hemostasis with direct pressure. On the right lateral gluteal area a large necrotic wound. Surrounding this subcutaneous firmness. This was painful but not as much as I am used to seeing with calciphylaxis. There was satellite lesions more medially small x2. Electronic Signature(s) Signed: 06/05/2020 5:29:29 PM By: Linton Ham MD Entered By: Linton Ham on 06/05/2020 15:23:40 -------------------------------------------------------------------------------- Physician Orders Details Patient Name: Date of Service: [name] 06/05/2020 2:45 PM Medical Record Number: [id number] Patient Account Number: [id number] Date of Birth/Sex: Treating RN: 06-Apr-1956 (64 y.o. Janyth Contes Primary Care Provider: [name] Other Clinician: Referring Provider: Treating Provider/Extender: Randon Goldsmith in Treatment: 0 Verbal / Phone Orders: No Diagnosis Coding Follow-up Appointments ppointment in 1 week. - Friday Return A Dressing Change Frequency Change dressing every day. - all wounds Skin Barriers/Peri-Wound Care Skin Prep Wound Cleansing May shower and wash wound with soap and water. - on days that dressing is changed Primary Wound Dressing Wound #1 Right,Lateral Lower Leg Calcium Alginate with Silver Wound #2 Right,Proximal Gluteus Santyl Ointment Wound #3 Right,Distal Gluteus Santyl Ointment Secondary Dressing Wound #1 Right,Lateral Lower Leg Foam Border - or  large Wound #2 Right,Proximal Gluteus Foam Border - or large Wound #3 Right,Distal Gluteus Foam Border - or large Laboratory Bacteria identified in Tissue by Biopsy culture (MICRO) - Right distal gluteus LOINC Code: 54270-6 Convenience Name: Biopsy specimen culture Patient Medications llergies: penicillin, propolis (bee glue), Vioxx A Notifications Medication Indication Start End 06/05/2020 Santyl DOSE 1 - topical 250 unit/gram ointment - 1 ointment topical daily to wound Electronic Signature(s) Signed: 06/05/2020 5:02:14 PM By: Levan Hurst RN, BSN Signed: 06/05/2020 5:29:29 PM By: Linton Ham MD Entered By: Levan Hurst on 06/05/2020 17:01:39 Prescription 06/05/2020 -------------------------------------------------------------------------------- Lezlie Lye MD Patient Name: Provider: [birth year] [id number] Date of Birth: NPI#: [name] [id number] Sex: DEA #: [id number] Phone #: License #: [place] Patient Address: [address], [place] [address] [place], [place] [address] [phone] Allergies Name Reaction Severity penicillin propolis (bee glue) Vioxx Medication Medication: Route: Strength: Form: Santyl topical 250 unit/gram ointment Class: TOPICAL/MUCOUS MEMBR./SUBCUT ENZYMES . Dose: Frequency / Time: Indication: 1 1 ointment topical daily to wound Number of Refills: Number of Units: 3 Generic Substitution: Start Date: End Date: Administered at Facility: Substitution Permitted  06/05/2020 No Note to Pharmacy: Hand Signature: Date(s): Electronic Signature(s) Signed: 06/05/2020 5:02:14 PM By: Levan Hurst RN, BSN Signed: 06/05/2020 5:29:29 PM By: Linton Ham MD Entered By: Levan Hurst on 06/05/2020 17:01:40 -------------------------------------------------------------------------------- Problem List Details Patient Name: Date of  Service: [name], [name] 06/05/2020 2:45 PM Medical Record Number: [id number] Patient Account Number: [id number] Date of Birth/Sex: Treating RN: 11/10/1956 (64 y.o. Janyth Contes Primary Care Provider: [name] Other Clinician: Referring Provider: Treating Provider/Extender: Randon Goldsmith in Treatment: 0 Active Problems ICD-10 Encounter Code Description Active Date MDM Diagnosis I87.331 Chronic venous hypertension (idiopathic) with ulcer and inflammation of right 06/05/2020 No Yes lower extremity L97.212 Non-pressure chronic ulcer of right calf with fat layer exposed 06/05/2020 No Yes L89.310 Pressure ulcer of right buttock, unstageable 06/05/2020 No Yes E11.22 Type 2 diabetes mellitus with diabetic chronic kidney disease 06/05/2020 No Yes Inactive Problems Resolved Problems Electronic Signature(s) Signed: 06/05/2020 5:29:29 PM By: Linton Ham MD Entered By: Linton Ham on 06/05/2020 15:02:12 -------------------------------------------------------------------------------- Progress Note Details Patient Name: Date of Service: [name] 06/05/2020 2:45 PM Medical Record Number: [id number] Patient Account Number: [id number] Date of Birth/Sex: Treating RN: Aug 06, 1956 (64 y.o. Janyth Contes Primary Care Provider: [name] Other Clinician: Referring Provider: Treating Provider/Extender: Randon Goldsmith in Treatment: 0 Subjective Chief Complaint Information obtained from Patient 06/05/2020; patient I think was referred from podiatry for a wound on his right lateral lower leg. Almost as an after thought he mentioned that he has an area on his right gluteal area. We also looked at this History of Present Illness (HPI) ADMISSION 06/05/2020 This is a 64 year old man who has been on dialysis secondary to diabetes for about 5 years. He was recently seen by Dr. Amalia Hailey I think for routine and prophylactic nail care and  foot care. He mentioned that he had in this area on the right lateral leg and he was referred here for review of this. He has been using gentamicin cream on the wound. He also uses a cam boot for Charcot ankle. He tells me that he does not have a wound on his left foot or ankle. Almost as a side he mentioned that he had a wound on the right gluteal area. He states that this wound had been present since he was hospitalized on 2 different occasions in March. Firstly from 2/28 through 3/6 for Covid and strep bacteremia. He was rehospitalized from 3/9 through 3/12 secondary to hypotension and hypothermia. It was notable during that admission that he had a wound on the right lateral leg they did not mention the area on the right gluteal. In any case the patient is using gentamicin on this area as well his wife changes the dressing. Past medical history; end-stage renal disease on dialysis secondary to type 2 diabetes, hypertension, hyperlipidemia Charcot ankle on the left foot and ankle, he is an ex-smoker 10 years ago remote history of hepatitis C and congestive heart failure. The patient had vascular studies on 05/01/2020 this showed ABIs on the right at 2.06 but with triphasic and biphasic waveforms his TBI was actually elevated at 1.25. He did not allow the cam boot to be removed and therefore arterial studies on the left were not done Patient History Allergies penicillin, propolis (bee glue), Vioxx Family History Cancer - Father, Diabetes - Mother,Maternal Grandparents, Hypertension - Mother, No family history of Heart Disease, Hereditary Spherocytosis, Kidney Disease, Lung Disease, Seizures, Stroke, Thyroid Problems, Tuberculosis. Social History Former smoker,  Alcohol Use - Never - used to drink, Drug Use - No History, Caffeine Use - Never. Medical History Eyes Patient has history of Cataracts Denies history of Glaucoma, Optic Neuritis Ear/Nose/Mouth/Throat Denies history of Chronic sinus  problems/congestion, Middle ear problems Respiratory Patient has history of Sleep Apnea Cardiovascular Patient has history of Congestive Heart Failure, Hypertension Gastrointestinal Patient has history of Hepatitis C Endocrine Patient has history of Type II Diabetes Genitourinary Patient has history of End Stage Renal Disease Integumentary (Skin) Denies history of History of Burn Neurologic Patient has history of Neuropathy Patient is treated with Insulin. Blood sugar is not tested. Hospitalization/Surgery History - kidney removal, 2020. - fistula placement, 2019. Medical A Surgical History Notes nd Respiratory COVID, pneumonia Cardiovascular HLD Genitourinary dialysis M/W/F Review of Systems (ROS) Constitutional Symptoms (General Health) Denies complaints or symptoms of Fatigue, Fever, Chills, Marked Weight Change. Eyes Denies complaints or symptoms of Dry Eyes, Vision Changes, Glasses / Contacts. Ear/Nose/Mouth/Throat Denies complaints or symptoms of Chronic sinus problems or rhinitis. Cardiovascular Denies complaints or symptoms of Chest pain. Endocrine Denies complaints or symptoms of Heat/cold intolerance. Integumentary (Skin) right lateral lower leg, left glut Musculoskeletal Denies complaints or symptoms of Muscle Pain, Muscle Weakness. Psychiatric Denies complaints or symptoms of Claustrophobia, Suicidal. Objective Constitutional Patient is hypertensive.. Pulse regular and within target range for patient.Marland Kitchen Respirations regular, non-labored and within target range.. Temperature is normal and within the target range for the patient.Marland Kitchen Appears in no distress. Vitals Time Taken: 2:00 PM, Height: 73 in, Source: Stated, Weight: 250 lbs, Source: Stated, BMI: 33, Temperature: 97.6 F, Pulse: 96 bpm, Respiratory Rate: 19 breaths/min, Blood Pressure: 141/76 mmHg. General Notes: patient did not check CBG this morning Respiratory work of breathing is  normal. Cardiovascular Femoral and popliteal pulses were palpable on the right. Pedal pulses were nonpalpable in the right foot either dorsalis pedis or posterior tibial. Edema in the right lower leg which is nonpitting. Skin discoloration looking like hemosiderin deposition. General Notes: Wound exam; ooOn the right lateral lower leg just above the ankle. A small benign looking wound. Debris on the surface debrided with a #3 curette he has rims of epithelialization. This cleaned up quite nicely hemostasis with direct pressure. ooOn the right lateral gluteal area a large necrotic wound. Surrounding this subcutaneous firmness. This was painful but not as much as I am used to seeing with calciphylaxis. There was satellite lesions more medially small x2. Integumentary (Hair, Skin) Wound #1 status is Open. Original cause of wound was Gradually Appeared. The wound is located on the Right,Lateral Lower Leg. The wound measures 2cm length x 0.9cm width x 0.1cm depth; 1.414cm^2 area and 0.141cm^3 volume. There is Fat Layer (Subcutaneous Tissue) Exposed exposed. There is no tunneling or undermining noted. There is a medium amount of serosanguineous drainage noted. The wound margin is distinct with the outline attached to the wound base. There is medium (34-66%) red, pink granulation within the wound bed. There is a medium (34-66%) amount of necrotic tissue within the wound bed including Adherent Slough. Wound #2 status is Open. Original cause of wound was Gradually Appeared. The wound is located on the Right,Proximal Gluteus. The wound measures 0.5cm length x 0.6cm width x 0.1cm depth; 0.236cm^2 area and 0.024cm^3 volume. There is Fat Layer (Subcutaneous Tissue) Exposed exposed. There is no tunneling or undermining noted. There is a medium amount of serosanguineous drainage noted. The wound margin is distinct with the outline attached to the wound base. There is no granulation within the wound bed.  There is  a large (67-100%) amount of necrotic tissue within the wound bed including Eschar and Adherent Slough. Wound #3 status is Open. Original cause of wound was Gradually Appeared. The wound is located on the Right,Distal Gluteus. The wound measures 5.8cm length x 5.1cm width x 2.1cm depth; 23.232cm^2 area and 48.787cm^3 volume. There is no tunneling or undermining noted. There is a medium amount of serosanguineous drainage noted. The wound margin is well defined and not attached to the wound base. There is small (1-33%) pink granulation within the wound bed. There is a large (67-100%) amount of necrotic tissue within the wound bed including Eschar and Adherent Slough. Assessment Active Problems ICD-10 Chronic venous hypertension (idiopathic) with ulcer and inflammation of right lower extremity Non-pressure chronic ulcer of right calf with fat layer exposed Pressure ulcer of right buttock, unstageable Type 2 diabetes mellitus with diabetic chronic kidney disease Procedures Wound #1 Pre-procedure diagnosis of Wound #1 is a Venous Leg Ulcer located on the Right,Lateral Lower Leg .Severity of Tissue Pre Debridement is: Fat layer exposed. There was a Excisional Skin/Subcutaneous Tissue Debridement with a total area of 1.8 sq cm performed by Ricard Dillon., MD. With the following instrument(s): Curette to remove Viable and Non-Viable tissue/material. Material removed includes Subcutaneous Tissue and Slough and. No specimens were taken. A time out was conducted at 14:38, prior to the start of the procedure. A Minimum amount of bleeding was controlled with Pressure. The procedure was tolerated well with a pain level of 0 throughout and a pain level of 0 following the procedure. Post Debridement Measurements: 2cm length x 0.9cm width x 0.1cm depth; 0.141cm^3 volume. Character of Wound/Ulcer Post Debridement is improved. Severity of Tissue Post Debridement is: Fat layer exposed. Post procedure Diagnosis  Wound #1: Same as Pre-Procedure Wound #2 Pre-procedure diagnosis of Wound #2 is an Atypical located on the Right,Proximal Gluteus . There was a Chemical/Enzymatic/Mechanical debridement performed by Levan Hurst, RN. With the following instrument(s): Curette to remove Viable and Non-Viable tissue/material. Material removed includes Subcutaneous Tissue and Slough and. Agent used was [employer]. A time out was conducted at 14:38, prior to the start of the procedure. A Minimum amount of bleeding was controlled with Pressure. The procedure was tolerated well with a pain level of 0 throughout and a pain level of 0 following the procedure. Post Debridement Measurements: 0.5cm length x 0.6cm width x 0.1cm depth; 0.024cm^3 volume. Character of Wound/Ulcer Post Debridement requires further debridement. Post procedure Diagnosis Wound #2: Same as Pre-Procedure Wound #3 Pre-procedure diagnosis of Wound #3 is a Pressure Ulcer located on the Right,Distal Gluteus . There was a Chemical/Enzymatic/Mechanical debridement performed by Levan Hurst, RN. to remove Viable and Non-Viable tissue/material. Material removed includes Subcutaneous Tissue and Slough and. Agent used was [employer]. A time out was conducted at 14:38, prior to the start of the procedure. A Minimum amount of bleeding was controlled with Pressure. The procedure was tolerated well with a pain level of 0 throughout and a pain level of 0 following the procedure. Post Debridement Measurements: 5.8cm length x 5.1cm width x 2.1cm depth; 48.787cm^3 volume. Character of Wound/Ulcer Post Debridement is improved. Post procedure Diagnosis Wound #3: Same as Pre-Procedure Pre-procedure diagnosis of Wound #3 is a Pressure Ulcer located on the Right, Distal Gluteus . There was a biopsy performed by Ricard Dillon., MD. There was a biopsy performed on Wound Margin. The skin was cleansed and prepped with anti-septic followed by pain control using Lidocaine  Injectable: 1%. Utilizing a 4  mm tissue punch, tissue was removed at its base with the following instrument(s): Forceps and sent to pathology. A Moderate amount of bleeding was controlled with Silver Nitrate. A time out was conducted at 14:50, prior to the start of the procedure. The procedure was tolerated well with a pain level of 0 throughout and a pain level of 0 following the procedure. Post procedure Diagnosis Wound #3: Same as Pre-Procedure Plan Follow-up Appointments: Return Appointment in 1 week. - Friday Dressing Change Frequency: Change dressing every day. - all wounds Skin Barriers/Peri-Wound Care: Skin Prep Wound Cleansing: May shower and wash wound with soap and water. - on days that dressing is changed Primary Wound Dressing: Wound #1 Right,Lateral Lower Leg: Calcium Alginate with Silver Wound #2 Right,Proximal Gluteus: Santyl Ointment Wound #3 Right,Distal Gluteus: Santyl Ointment Secondary Dressing: Wound #1 Right,Lateral Lower Leg: Foam Border - or large Wound #2 Right,Proximal Gluteus: Foam Border - or large Wound #3 Right,Distal Gluteus: Foam Border - or large Laboratory ordered were: Biopsy specimen culture - Right distal gluteus The following medication(s) was prescribed: Santyl topical 250 unit/gram ointment 1 1 ointment topical daily to wound starting 06/05/2020 1. The right lateral lower leg wound looks like a venous wound to me. Debrided cleaned up nicely. We use silver alginate and border foam that they will change every second day 2. His arterial studies were not completely reassuring. I could not feel a dorsalis pedis or posterior tibial pulse although his femoral and popliteal pulses were palpable. 3. The area on his right buttock superficially looked like a pressure sore that was necrotic although the firmness around this and the subcutaneous tissue raise the possibility of calciphylaxis. I did 2 biopsies one on the rim laterally and one in the mid  part of the medial wound. Also the small satellite lesions seem more like what I did see with calciphylaxis and a pressure area. This man is very mobile which also lended itself against a pressure ulcer. He says this is actually been there since the hospitalization in March although I do not see any reference to it 4. He had a right upper lobe masso Strep pneumonia on chest x-ray in the hospital this was followed in April and was clearing up nicely likely residual pneumonia. 5. Await biopsies x2 of the right gluteal wound. We will use Santyl on this area which she says he is going to get through the [facility] [place]. He dialyzes at the [place] in [place] I spent 40 minutes in review of this patient's past medical history, face-to-face evaluation and preparation of this record Electronic Signature(s) Signed: 06/05/2020 5:02:14 PM By: Levan Hurst RN, BSN Signed: 06/05/2020 5:29:29 PM By: Linton Ham MD Entered By: Levan Hurst on 06/05/2020 17:01:55 -------------------------------------------------------------------------------- HxROS Details Patient Name: Date of Service: [name]. 06/05/2020 2:45 PM Medical Record Number: [id number] Patient Account Number: [id number] Date of Birth/Sex: Treating RN: 12/18/1955 (64 y.o. Marvis Repress Primary Care Provider: [name] Other Clinician: Referring Provider: Treating Provider/Extender: Randon Goldsmith in Treatment: 0 Constitutional Symptoms (General Health) Complaints and Symptoms: Negative for: Fatigue; Fever; Chills; Marked Weight Change Eyes Complaints and Symptoms: Negative for: Dry Eyes; Vision Changes; Glasses / Contacts Medical History: Positive for: Cataracts Negative for: Glaucoma; Optic Neuritis Ear/Nose/Mouth/Throat Complaints and Symptoms: Negative for: Chronic sinus problems or rhinitis Medical History: Negative for: Chronic sinus problems/congestion; Middle ear  problems Cardiovascular Complaints and Symptoms: Negative for: Chest pain Medical History: Positive for: Congestive Heart Failure; Hypertension Past Medical History Notes: HLD  Endocrine Complaints and Symptoms: Negative for: Heat/cold intolerance Medical History: Positive for: Type II Diabetes Treated with: Insulin Blood sugar tested every day: No Musculoskeletal Complaints and Symptoms: Negative for: Muscle Pain; Muscle Weakness Psychiatric Complaints and Symptoms: Negative for: Claustrophobia; Suicidal Hematologic/Lymphatic Respiratory Medical History: Positive for: Sleep Apnea Past Medical History Notes: COVID, pneumonia Gastrointestinal Medical History: Positive for: Hepatitis C Genitourinary Medical History: Positive for: End Stage Renal Disease Past Medical History Notes: dialysis M/W/F Immunological Integumentary (Skin) Complaints and Symptoms: Review of System Notes: right lateral lower leg, left glut Medical History: Negative for: History of Burn Neurologic Medical History: Positive for: Neuropathy Oncologic HBO Extended History Items Eyes: Cataracts Immunizations Pneumococcal Vaccine: Received Pneumococcal Vaccination: No Implantable Devices None Hospitalization / Surgery History Type of Hospitalization/Surgery kidney removal, 2020 fistula placement, 2019 Family and Social History Cancer: Yes - Father; Diabetes: Yes - Mother,Maternal Grandparents; Heart Disease: No; Hereditary Spherocytosis: No; Hypertension: Yes - Mother; Kidney Disease: No; Lung Disease: No; Seizures: No; Stroke: No; Thyroid Problems: No; Tuberculosis: No; Former smoker; Alcohol Use: Never - used to drink; Drug Use: No History; Caffeine Use: Never; Financial Concerns: No; Food, Clothing or Shelter Needs: No; Support System Lacking: No; Transportation Concerns: No Electronic Signature(s) Signed: 06/05/2020 4:44:31 PM By: Kela Millin Signed: 06/05/2020 5:29:29 PM By:  Linton Ham MD Entered By: Kela Millin on 06/05/2020 14:15:24 -------------------------------------------------------------------------------- SuperBill Details Patient Name: Date of Service: [name] 06/05/2020 Medical Record Number: [id number] Patient Account Number: [id number] Date of Birth/Sex: Treating RN: 24-Feb-1956 (64 y.o. Janyth Contes Primary Care Provider: [name] Other Clinician: Referring Provider: Treating Provider/Extender: Randon Goldsmith in Treatment: 0 Diagnosis Coding ICD-10 Codes Code Description [phone] Chronic venous hypertension (idiopathic) with ulcer and inflammation of right lower extremity L97.212 Non-pressure chronic ulcer of right calf with fat layer exposed L89.310 Pressure ulcer of right buttock, unstageable E11.22 Type 2 diabetes mellitus with diabetic chronic kidney disease Facility Procedures CPT4 Code: 03009233 Description: [facility] VISIT-LEV 3 EST PT Modifier: 25 Quantity: 1 CPT4 Code: 00762263 Description: 33545 - DEBRIDE W/O ANES NON SELECT Modifier: 59 Quantity: 1 CPT4 Code: 62563893 Description: [facility] - DEB SUBQ TISSUE 20 SQ CM/< ICD-10 Diagnosis Description L97.212 Non-pressure chronic ulcer of right calf with fat layer exposed Modifier: Quantity: 1 CPT4 Code: [id number] Description: 11104-Punch biopsy of skin (including simple closure, when performed) single lesion ICD-10 Diagnosis Description L89.310 Pressure ulcer of right buttock, unstageable Modifier: 59 Quantity: 2 Physician Procedures : CPT4 Code Description Modifier [id number] 11042 - WC PHYS SUBQ TISS 20 SQ CM ICD-10 Diagnosis Description L97.212 Non-pressure chronic ulcer of right calf with fat layer exposed Quantity: 1 : 11104 Punch biopsy of skin (including simple closure, when performed) single lesion 59 ICD-10 Diagnosis Description L89.310 Pressure ulcer of right buttock, unstageable Quantity: 2 Electronic  Signature(s) Signed: 06/05/2020 5:02:14 PM By: Levan Hurst RN, BSN Signed: 06/05/2020 5:29:29 PM By: Linton Ham MD Entered By: Levan Hurst on 06/05/2020 16:05:12

## 2020-06-05 NOTE — Progress Notes (Signed)
[name], [name] ([id number]) Visit Report for 06/05/2020 Abuse/Suicide Risk Screen Details Patient Name: Date of Service: [name], [name] 06/05/2020 2:45 PM Medical Record Number: [id number] Patient Account Number: [id number] Date of Birth/Sex: Treating RN: 04/16/1956 (64 y.o. Marvis Repress Primary Care Keyasia Jolliff: [name] Other Clinician: Referring Walburga Hudman: Treating Rhydian Baldi/Extender: Randon Goldsmith in Treatment: 0 Abuse/Suicide Risk Screen Items Answer ABUSE RISK SCREEN: Has anyone close to you tried to hurt or harm you recentlyo No Do you feel uncomfortable with anyone in your familyo No Has anyone forced you do things that you didnt want to doo No Electronic Signature(s) Signed: 06/05/2020 4:44:31 PM By: Kela Millin Entered By: Kela Millin on 06/05/2020 14:15:34 -------------------------------------------------------------------------------- Activities of Daily Living Details Patient Name: Date of Service: [name], [name] 06/05/2020 2:45 PM Medical Record Number: [id number] Patient Account Number: [id number] Date of Birth/Sex: Treating RN: August 31, 1956 (64 y.o. Marvis Repress Primary Care Abigayl Hor: [name] Other Clinician: Referring Akina Maish: Treating Lizvet Chunn/Extender: Randon Goldsmith in Treatment: 0 Activities of Daily Living Items Answer Activities of Daily Living (Please select one for each item) Drive Automobile Completely Able T Medications ake Completely Able Use T elephone Completely Able Care for Appearance Completely Able Use T oilet Completely Able Bath / Shower Completely Able Dress Self Completely Able Feed Self Completely Able Walk Completely Able Get In / Out Bed Completely Able Housework Completely Able Prepare Meals Completely [place] for Self Completely Able Electronic Signature(s) Signed: 06/05/2020 4:44:31 PM By: Kela Millin Entered  By: Kela Millin on 06/05/2020 14:15:59 -------------------------------------------------------------------------------- Education Screening Details Patient Name: Date of Service: Nicholas Mow 06/05/2020 2:45 PM Medical Record Number: [id number] Patient Account Number: [id number] Date of Birth/Sex: Treating RN: 1956-01-02 (64 y.o. Marvis Repress Primary Care Gavyn Zoss: [name] Other Clinician: Referring Domenico Achord: Treating Milen Lengacher/Extender: Randon Goldsmith in Treatment: 0 Primary Learner Assessed: Patient Learning Preferences/Education Level/Primary Language Learning Preference: Explanation Preferred Language: English Cognitive Barrier Language Barrier: No Translator Needed: No Memory Deficit: No Emotional Barrier: No Cultural/Religious Beliefs Affecting Medical Care: No Physical Barrier Impaired Vision: No Impaired Hearing: No Decreased Hand dexterity: No Knowledge/Comprehension Knowledge Level: Medium Comprehension Level: Medium Ability to understand written instructions: Medium Ability to understand verbal instructions: Medium Motivation Anxiety Level: Calm Cooperation: Cooperative Education Importance: Acknowledges Need Interest in Health Problems: Asks Questions Perception: Coherent Willingness to Engage in Self-Management High Activities: Readiness to Engage in Self-Management High Activities: Electronic Signature(s) Signed: 06/05/2020 4:44:31 PM By: Kela Millin Entered By: Kela Millin on 06/05/2020 14:16:19 -------------------------------------------------------------------------------- Fall Risk Assessment Details Patient Name: Date of Service: Nicholas Mow. 06/05/2020 2:45 PM Medical Record Number: [id number] Patient Account Number: [id number] Date of Birth/Sex: Treating RN: 14-Sep-1956 (64 y.o. Marvis Repress Primary Care Mertis Mosher: [name] Other Clinician: Referring Honey Zakarian: Treating  Remedios Mckone/Extender: Randon Goldsmith in Treatment: 0 Fall Risk Assessment Items Have you had 2 or more falls in the last 12 monthso 0 No Have you had any fall that resulted in injury in the last 12 monthso 0 No FALLS RISK SCREEN History of falling - immediate or within 3 months 0 No Secondary diagnosis (Do you have 2 or more medical diagnoseso) 15 Yes Ambulatory aid None/bed rest/wheelchair/nurse 0 Yes Crutches/cane/walker 0 No Furniture 0 No Intravenous therapy Access/Saline/Heparin Lock 0 No Gait/Transferring Normal/ bed rest/ wheelchair 0 Yes Weak (short steps with or without shuffle, stooped but able to lift head while walking, may seek 0  No support from furniture) Impaired (short steps with shuffle, may have difficulty arising from chair, head down, impaired 0 No balance) Mental Status Oriented to own ability 0 Yes Electronic Signature(s) Signed: 06/05/2020 4:44:31 PM By: Kela Millin Entered By: Kela Millin on 06/05/2020 14:16:33 -------------------------------------------------------------------------------- Foot Assessment Details Patient Name: Date of Service: Nicholas Mow. 06/05/2020 2:45 PM Medical Record Number: [id number] Patient Account Number: [id number] Date of Birth/Sex: Treating RN: 1956/05/10 (64 y.o. Marvis Repress Primary Care Tenae Graziosi: [name] Other Clinician: Referring Jerome Otter: Treating Sanav Remer/Extender: Randon Goldsmith in Treatment: 0 Foot Assessment Items Site Locations + = Sensation present, - = Sensation absent, C = Callus, U = Ulcer R = Redness, W = Warmth, M = Maceration, PU = Pre-ulcerative lesion F = Fissure, S = Swelling, D = Dryness Assessment Right: Left: Other Deformity: No No Prior Foot Ulcer: No No Prior Amputation: No No Charcot Joint: No No Ambulatory Status: Ambulatory Without Help Gait: Steady Electronic Signature(s) Signed: 06/05/2020 4:44:31 PM By: Kela Millin Entered By: Kela Millin on 06/05/2020 14:16:57 -------------------------------------------------------------------------------- Nutrition Risk Screening Details Patient Name: Date of Service: [name], [name] 06/05/2020 2:45 PM Medical Record Number: [id number] Patient Account Number: [id number] Date of Birth/Sex: Treating RN: [birth year] (64 y.o. Marvis Repress Primary Care Merideth Bosque: [name] Other Clinician: Referring Gregg Holster: Treating Cadince Hilscher/Extender: Randon Goldsmith in Treatment: 0 Height (in): 73 Weight (lbs): 250 Body Mass Index (BMI): 33 Nutrition Risk Screening Items Score Screening NUTRITION RISK SCREEN: I have an illness or condition that made me change the kind and/or amount of food I eat 0 No I eat fewer than two meals per day 0 No I eat few fruits and vegetables, or milk products 0 No I have three or more drinks of beer, liquor or wine almost every day 0 No I have tooth or mouth problems that make it hard for me to eat 0 No I don't always have enough money to buy the food I need 0 No I eat alone most of the time 0 No I take three or more different prescribed or over-the-counter drugs a day 1 Yes Without wanting to, I have lost or gained 10 pounds in the last six months 0 No I am not always physically able to shop, cook and/or feed myself 0 No Nutrition Protocols Good Risk Protocol Moderate Risk Protocol 0 Provide education on nutrition High Risk Proctocol Risk Level: Good Risk Score: 1 Electronic Signature(s) Signed: 06/05/2020 4:44:31 PM By: Kela Millin Entered By: Kela Millin on 06/05/2020 14:16:46

## 2020-06-10 ENCOUNTER — Encounter (HOSPITAL_BASED_OUTPATIENT_CLINIC_OR_DEPARTMENT_OTHER): Payer: Medicare Other | Admitting: Internal Medicine

## 2020-06-16 ENCOUNTER — Other Ambulatory Visit: Payer: Self-pay | Admitting: Podiatry

## 2020-06-24 ENCOUNTER — Encounter (HOSPITAL_BASED_OUTPATIENT_CLINIC_OR_DEPARTMENT_OTHER): Payer: PRIVATE HEALTH INSURANCE | Admitting: Internal Medicine

## 2020-06-24 ENCOUNTER — Other Ambulatory Visit: Payer: Self-pay

## 2020-06-24 ENCOUNTER — Encounter (HOSPITAL_BASED_OUTPATIENT_CLINIC_OR_DEPARTMENT_OTHER): Payer: Medicare (Managed Care) | Attending: Internal Medicine | Admitting: Internal Medicine

## 2020-06-24 DIAGNOSIS — N186 End stage renal disease: Secondary | ICD-10-CM | POA: Diagnosis not present

## 2020-06-24 DIAGNOSIS — I509 Heart failure, unspecified: Secondary | ICD-10-CM | POA: Insufficient documentation

## 2020-06-24 DIAGNOSIS — L97812 Non-pressure chronic ulcer of other part of right lower leg with fat layer exposed: Secondary | ICD-10-CM | POA: Diagnosis not present

## 2020-06-24 DIAGNOSIS — E11610 Type 2 diabetes mellitus with diabetic neuropathic arthropathy: Secondary | ICD-10-CM | POA: Insufficient documentation

## 2020-06-24 DIAGNOSIS — Z87891 Personal history of nicotine dependence: Secondary | ICD-10-CM | POA: Diagnosis not present

## 2020-06-24 DIAGNOSIS — L89310 Pressure ulcer of right buttock, unstageable: Secondary | ICD-10-CM | POA: Diagnosis not present

## 2020-06-24 DIAGNOSIS — Z992 Dependence on renal dialysis: Secondary | ICD-10-CM | POA: Diagnosis not present

## 2020-06-24 DIAGNOSIS — I110 Hypertensive heart disease with heart failure: Secondary | ICD-10-CM | POA: Diagnosis not present

## 2020-06-24 DIAGNOSIS — L97212 Non-pressure chronic ulcer of right calf with fat layer exposed: Secondary | ICD-10-CM | POA: Insufficient documentation

## 2020-06-24 DIAGNOSIS — L98412 Non-pressure chronic ulcer of buttock with fat layer exposed: Secondary | ICD-10-CM | POA: Diagnosis not present

## 2020-06-24 DIAGNOSIS — E1122 Type 2 diabetes mellitus with diabetic chronic kidney disease: Secondary | ICD-10-CM | POA: Diagnosis not present

## 2020-06-24 DIAGNOSIS — I87331 Chronic venous hypertension (idiopathic) with ulcer and inflammation of right lower extremity: Secondary | ICD-10-CM | POA: Insufficient documentation

## 2020-06-24 NOTE — Progress Notes (Signed)
[name], [name] ([id number]) Visit Report for 06/24/2020 HPI Details Patient Name: Date of Service: [name] 06/24/2020 8:30 A M Medical Record Number: [id number] Patient Account Number: [id number] Date of Birth/Sex: Treating RN: December 10, 1955 (64 y.o. [name]) Carlene Coria Primary Care Provider: [name] Other Clinician: Referring Provider: Treating Provider/Extender: Lowella Petties in Treatment: 2 History of Present Illness HPI Description: ADMISSION 06/05/2020 This is a 64 year old man who has been on dialysis secondary to diabetes for about 5 years. He was recently seen by Dr. Amalia Hailey I think for routine and prophylactic nail care and foot care. He mentioned that he had in this area on the right lateral leg and he was referred here for review of this. He has been using gentamicin cream on the wound. He also uses a cam boot for Charcot ankle. He tells me that he does not have a wound on his left foot or ankle. Almost as a side he mentioned that he had a wound on the right gluteal area. He states that this wound had been present since he was hospitalized on 2 different occasions in March. Firstly from 2/28 through 3/6 for Covid and strep bacteremia. He was rehospitalized from 3/9 through 3/12 secondary to hypotension and hypothermia. It was notable during that admission that he had a wound on the right lateral leg they did not mention the area on the right gluteal. In any case the patient is using gentamicin on this area as well his wife changes the dressing. Past medical history; end-stage renal disease on dialysis secondary to type 2 diabetes, hypertension, hyperlipidemia Charcot ankle on the left foot and ankle, he is an ex-smoker 10 years ago remote history of hepatitis C and congestive heart failure. The patient had vascular studies on 05/01/2020 this showed ABIs on the right at 2.06 but with triphasic and biphasic waveforms his TBI was actually elevated at 1.25.  He did not allow the cam boot to be removed and therefore arterial studies on the left were not done 8/10-Patient returns to clinic has continued discomfort in the right gluteal ischial area where he has a wound and less discomfort the right lateral leg wound. We have been using Santyl to these areas. He was biopsied at last visit and the results indicate inflamed granulation tissue and vasculopathy-the latter seems consistent with calciphylaxis and given the location of wound not clearly pressure etiology this may well be the case Electronic Signature(s) Signed: 06/24/2020 9:04:38 AM By: Tobi Bastos MD, MBA Previous Signature: 06/24/2020 8:56:03 AM Version By: Tobi Bastos MD, MBA Entered By: Tobi Bastos on 06/24/2020 09:04:37 -------------------------------------------------------------------------------- Physical Exam Details Patient Name: Date of Service: [name]. 06/24/2020 8:30 [place] Record Number: [id number] Patient Account Number: [id number] Date of Birth/Sex: Treating RN: 05/10/1956 (64 y.o. [name] Primary Care Provider: [name] Other Clinician: Referring Provider: Treating Provider/Extender: Lowella Petties in Treatment: 2 Constitutional alert and oriented x 3. sitting or standing blood pressure is within target range for patient.. supine blood pressure is within target range for patient.. pulse regular and within target range for patient.Marland Kitchen respirations regular, non-labored and within target range for patient.Marland Kitchen temperature within target range for patient.. . . Well- nourished and well-hydrated in no acute distress. Notes Right ischial/gluteal wound with 2 small open areas where the punch biopsies were done, the more lateral of the 2 undermines to about 2.5 cm in the 9-12 position Right lateral leg wound appears stable Electronic Signature(s) Signed:  06/24/2020 8:56:52 AM By: Tobi Bastos MD, MBA Entered By: Tobi Bastos on 06/24/2020 08:56:52 -------------------------------------------------------------------------------- Physician Orders Details Patient Name: Date of Service: Nicholas Sheen EL L. 06/24/2020 8:30 A M Medical Record Number: [id number] Patient Account Number: [id number] Date of Birth/Sex: Treating RN: 1956-09-05 (64 y.o. [name]) Carlene Coria Primary Care Provider: [name] Other Clinician: Referring Provider: Treating Provider/Extender: Lowella Petties in Treatment: 2 Verbal / Phone Orders: No Diagnosis Coding ICD-10 Coding Code Description [phone] Chronic venous hypertension (idiopathic) with ulcer and inflammation of right lower extremity L97.212 Non-pressure chronic ulcer of right calf with fat layer exposed L89.310 Pressure ulcer of right buttock, unstageable E11.22 Type 2 diabetes mellitus with diabetic chronic kidney disease Follow-up Appointments Return Appointment in 1 week. Dressing Change Frequency Change dressing every day. - all wounds Skin Barriers/Peri-Wound Care Skin Prep Wound Cleansing May shower and wash wound with soap and water. - on days that dressing is changed Primary Wound Dressing Wound #1 Right,Lateral Lower Leg Calcium Alginate with Silver Wound #3 Right,Distal Gluteus Santyl Ointment Wound #4 Right,Medial Gluteus Santyl Ointment Secondary Dressing Wound #1 Right,Lateral Lower Leg Foam Border - or large Wound #3 Right,Distal Gluteus Foam Border - or large abd pad Wound #4 Right,Medial Gluteus Foam Border - or large abd pad Electronic Signature(s) Signed: 06/24/2020 4:52:06 PM By: Tobi Bastos MD, MBA Signed: 06/24/2020 5:05:11 PM By: Carlene Coria RN Entered By: Carlene Coria on 06/24/2020 08:54:17 -------------------------------------------------------------------------------- Problem List Details Patient Name: Date of Service: Nicholas Sheen EL L. 06/24/2020 8:30 A M Medical Record Number: [id number] Patient Account  Number: [id number] Date of Birth/Sex: Treating RN: [birth year] (64 y.o. [name] Primary Care Provider: [name] Other Clinician: Referring Provider: Treating Provider/Extender: Lowella Petties in Treatment: 2 Active Problems ICD-10 Encounter Code Description Active Date MDM Diagnosis I87.331 Chronic venous hypertension (idiopathic) with ulcer and inflammation of right 06/05/2020 No Yes lower extremity L97.212 Non-pressure chronic ulcer of right calf with fat layer exposed 06/05/2020 No Yes L89.310 Pressure ulcer of right buttock, unstageable 06/05/2020 No Yes E11.22 Type 2 diabetes mellitus with diabetic chronic kidney disease 06/05/2020 No Yes L95.9 Vasculitis limited to the skin, unspecified 06/24/2020 No Yes Inactive Problems Resolved Problems Electronic Signature(s) Signed: 06/24/2020 9:02:02 AM By: Tobi Bastos MD, MBA Entered By: Tobi Bastos on 06/24/2020 09:02:01 -------------------------------------------------------------------------------- Progress Note Details Patient Name: Date of Service: [name]. 06/24/2020 8:30 A M Medical Record Number: [id number] Patient Account Number: [id number] Date of Birth/Sex: Treating RN: 1956/08/26 (64 y.o. [name] Primary Care Provider: [name] Other Clinician: Referring Provider: Treating Provider/Extender: Lowella Petties in Treatment: 2 Subjective History of Present Illness (HPI) ADMISSION 06/05/2020 This is a 64 year old man who has been on dialysis secondary to diabetes for about 5 years. He was recently seen by Dr. Amalia Hailey I think for routine and prophylactic nail care and foot care. He mentioned that he had in this area on the right lateral leg and he was referred here for review of this. He has been using gentamicin cream on the wound. He also uses a cam boot for Charcot ankle. He tells me that he does not have a wound on his left foot or ankle. Almost as a  side he mentioned that he had a wound on the right gluteal area. He states that this wound had been present since he was hospitalized on 2 different occasions in March. Firstly from 2/28 through 3/6 for Covid and strep bacteremia. He was rehospitalized  from 3/9 through 3/12 secondary to hypotension and hypothermia. It was notable during that admission that he had a wound on the right lateral leg they did not mention the area on the right gluteal. In any case the patient is using gentamicin on this area as well his wife changes the dressing. Past medical history; end-stage renal disease on dialysis secondary to type 2 diabetes, hypertension, hyperlipidemia Charcot ankle on the left foot and ankle, he is an ex-smoker 10 years ago remote history of hepatitis C and congestive heart failure. The patient had vascular studies on 05/01/2020 this showed ABIs on the right at 2.06 but with triphasic and biphasic waveforms his TBI was actually elevated at 1.25. He did not allow the cam boot to be removed and therefore arterial studies on the left were not done 8/10-Patient returns to clinic has continued discomfort in the right gluteal ischial area where he has a wound and less discomfort the right lateral leg wound. We have been using Santyl to these areas. He was biopsied at last visit and the results indicate inflamed granulation tissue and vasculopathy-the latter seems consistent with calciphylaxis and given the location of wound not clearly pressure etiology this may well be the case Objective Constitutional alert and oriented x 3. sitting or standing blood pressure is within target range for patient.. supine blood pressure is within target range for patient.. pulse regular and within target range for patient.Marland Kitchen respirations regular, non-labored and within target range for patient.Marland Kitchen temperature within target range for patient.. Well- nourished and well-hydrated in no acute distress. Vitals Time Taken: 8:18  AM, Height: 73 in, Source: Stated, Weight: 250 lbs, Source: Stated, BMI: 33, Temperature: 98.0 F, Pulse: 84 bpm, Respiratory Rate: 20 breaths/min, Blood Pressure: 138/88 mmHg. General Notes: Right ischial/gluteal wound with 2 small open areas where the punch biopsies were done, the more lateral of the 2 undermines to about 2.5 cm in the 9-12 position Right lateral leg wound appears stable Integumentary (Hair, Skin) Wound #1 status is Open. Original cause of wound was Gradually Appeared. The wound is located on the Right,Lateral Lower Leg. The wound measures 0.8cm length x 0.4cm width x 0.1cm depth; 0.251cm^2 area and 0.025cm^3 volume. There is Fat Layer (Subcutaneous Tissue) Exposed exposed. There is no tunneling or undermining noted. There is a small amount of serosanguineous drainage noted. The wound margin is flat and intact. There is large (67-100%) red, pink granulation within the wound bed. There is no necrotic tissue within the wound bed. Wound #3 status is Open. Original cause of wound was Gradually Appeared. The wound is located on the Right,Distal Gluteus. The wound measures 6.2cm length x 6.4cm width x 1.5cm depth; 31.165cm^2 area and 46.747cm^3 volume. There is Fat Layer (Subcutaneous Tissue) Exposed exposed. There is no tunneling or undermining noted. There is a medium amount of serosanguineous drainage noted. The wound margin is flat and intact. There is small (1-33%) pink granulation within the wound bed. There is a large (67-100%) amount of necrotic tissue within the wound bed including Adherent Slough. General Notes: indurated Wound #4 status is Open. Original cause of wound was Gradually Appeared. The wound is located on the Right,Medial Gluteus. The wound measures 5.5cm length x 3.5cm width x 0.1cm depth; 15.119cm^2 area and 1.512cm^3 volume. There is Fat Layer (Subcutaneous Tissue) Exposed exposed. There is no tunneling or undermining noted. There is a small amount of  serosanguineous drainage noted. The wound margin is flat and intact. There is small (1-33%) red granulation within the  wound bed. There is a large (67-100%) amount of necrotic tissue within the wound bed including Adherent Slough. Assessment Active Problems ICD-10 Chronic venous hypertension (idiopathic) with ulcer and inflammation of right lower extremity Non-pressure chronic ulcer of right calf with fat layer exposed Pressure ulcer of right buttock, unstageable Type 2 diabetes mellitus with diabetic chronic kidney disease Vasculitis limited to the skin, unspecified Plan Follow-up Appointments: Return Appointment in 1 week. Dressing Change Frequency: Change dressing every day. - all wounds Skin Barriers/Peri-Wound Care: Skin Prep Wound Cleansing: May shower and wash wound with soap and water. - on days that dressing is changed Primary Wound Dressing: Wound #1 Right,Lateral Lower Leg: Calcium Alginate with Silver Wound #3 Right,Distal Gluteus: Santyl Ointment Wound #4 Right,Medial Gluteus: Santyl Ointment Secondary Dressing: Wound #1 Right,Lateral Lower Leg: Foam Border - or large Wound #3 Right,Distal Gluteus: Foam Border - or large abd pad Wound #4 Right,Medial Gluteus: Foam Border - or large abd pad -Continue using Santyl to the gluteal and the right lateral leg wound daily -Unfortunately these wounds if indeed are related to calciphylaxis have prospect of worsening despite management -Offloading the area is also important especially at his dialysis sessions perhaps but using a pillow wedge. -Wonder if cinacalcet use at HD may be Chief Strategy Officer) Signed: 06/24/2020 9:04:54 AM By: Tobi Bastos MD, MBA Previous Signature: 06/24/2020 9:03:39 AM Version By: Tobi Bastos MD, MBA Previous Signature: 06/24/2020 9:00:35 AM Version By: Tobi Bastos MD, MBA Entered By: Tobi Bastos on 06/24/2020  09:04:54 -------------------------------------------------------------------------------- SuperBill Details Patient Name: Date of Service: [name] 06/24/2020 Medical Record Number: [id number] Patient Account Number: [id number] Date of Birth/Sex: Treating RN: 14-Jun-1956 (64 y.o. Staci Acosta, [place] Primary Care Provider: [name] Other Clinician: Referring Provider: Treating Provider/Extender: Lowella Petties in Treatment: 2 Diagnosis Coding ICD-10 Codes Code Description [phone] Chronic venous hypertension (idiopathic) with ulcer and inflammation of right lower extremity L97.212 Non-pressure chronic ulcer of right calf with fat layer exposed L89.310 Pressure ulcer of right buttock, unstageable E11.22 Type 2 diabetes mellitus with diabetic chronic kidney disease Facility Procedures CPT4 Code: [id number] Description: 9214 - WOUND CARE VISIT-LEV 4 EST PT Modifier: Quantity: 1 Physician Procedures : CPT4 Code Description Modifier [id number] - WC PHYS LEVEL 3 - EST PT ICD-10 Diagnosis Description L97.212 Non-pressure chronic ulcer of right calf with fat layer exposed Quantity: 1 Electronic Signature(s) Signed: 06/24/2020 4:52:06 PM By: Tobi Bastos MD, MBA Signed: 06/24/2020 5:05:11 PM By: Carlene Coria RN Previous Signature: 06/24/2020 9:00:53 AM Version By: Tobi Bastos MD, MBA Entered By: Carlene Coria on 06/24/2020 09:07:34

## 2020-06-24 NOTE — Progress Notes (Signed)
ORLANDUS, BOROWSKI ([id number]) Visit Report for 06/24/2020 Arrival Information Details Patient Name: Date of Service: GRAIG, HESSLING 06/24/2020 8:30 A M Medical Record Number: [id number] Patient Account Number: [id number] Date of Birth/Sex: Treating RN: 1956-03-27 (64 y.o. Ernestene Mention Primary Care Tyquez Hollibaugh: [name] Other Clinician: Referring Nollan Muldrow: Treating Candon Caras/Extender: Lowella Petties in Treatment: 2 Visit Information History Since Last Visit Added or deleted any medications: No Patient Arrived: Ambulatory Any new allergies or adverse reactions: No Arrival Time: 08:14 Had a fall or experienced change in No Accompanied By: self activities of daily living that may affect Transfer Assistance: None risk of falls: Patient Identification Verified: Yes Signs or symptoms of abuse/neglect since last visito No Secondary Verification Process Completed: Yes Hospitalized since last visit: No Patient Requires Transmission-Based Precautions: No Implantable device outside of the clinic excluding No Patient Has Alerts: Yes cellular tissue based products placed in the center Patient Alerts: Right ABI/TBI: 1.35 since last visit: Has Dressing in Place as Prescribed: Yes Pain Present Now: Yes Electronic Signature(s) Signed: 06/24/2020 5:26:04 PM By: Baruch Gouty RN, BSN Entered By: Baruch Gouty on 06/24/2020 08:16:58 -------------------------------------------------------------------------------- Clinic Level of Care Assessment Details Patient Name: Date of Service: MOUHAMADOU, GITTLEMAN EL L. 06/24/2020 8:30 A M Medical Record Number: [id number] Patient Account Number: [id number] Date of Birth/Sex: Treating RN: 09/24/1956 (64 y.o. [name] Primary Care Sultan Pargas: [name] Other Clinician: Referring Samy Ryner: Treating Lucette Kratz/Extender: Lowella Petties in Treatment: 2 Clinic Level of Care Assessment Items TOOL 4 Quantity  Score X- 1 0 Use when only an EandM is performed on FOLLOW-UP visit ASSESSMENTS - Nursing Assessment / Reassessment X- 1 10 Reassessment of Co-morbidities (includes updates in patient status) X- 1 5 Reassessment of Adherence to Treatment Plan ASSESSMENTS - Wound and Skin A ssessment / Reassessment []  - 0 Simple Wound Assessment / Reassessment - one wound X- 3 5 Complex Wound Assessment / Reassessment - multiple wounds []  - 0 Dermatologic / Skin Assessment (not related to wound area) ASSESSMENTS - Focused Assessment []  - 0 Circumferential Edema Measurements - multi extremities []  - 0 Nutritional Assessment / Counseling / Intervention []  - 0 Lower Extremity Assessment (monofilament, tuning fork, pulses) []  - 0 Peripheral Arterial Disease Assessment (using hand held doppler) ASSESSMENTS - Ostomy and/or Continence Assessment and Care []  - 0 Incontinence Assessment and Management []  - 0 Ostomy Care Assessment and Management (repouching, etc.) PROCESS - Coordination of Care X - Simple Patient / Family Education for ongoing care 1 15 []  - 0 Complex (extensive) Patient / Family Education for ongoing care X- 1 10 Staff obtains Programmer, systems, Records, T Results / Process Orders est []  - 0 Staff telephones HHA, Nursing Homes / Clarify orders / etc []  - 0 Routine Transfer to another Facility (non-emergent condition) []  - 0 Routine Hospital Admission (non-emergent condition) []  - 0 New Admissions / Biomedical engineer / Ordering NPWT Apligraf, etc. , []  - 0 Emergency Hospital Admission (emergent condition) X- 1 10 Simple Discharge Coordination []  - 0 Complex (extensive) Discharge Coordination PROCESS - Special Needs []  - 0 Pediatric / Minor Patient Management []  - 0 Isolation Patient Management []  - 0 Hearing / Language / Visual special needs []  - 0 Assessment of Community assistance (transportation, D/C planning, etc.) []  - 0 Additional assistance / Altered  mentation []  - 0 Support Surface(s) Assessment (bed, cushion, seat, etc.) INTERVENTIONS - Wound Cleansing / Measurement []  - 0 Simple Wound Cleansing - one wound X- 3 5 Complex  Wound Cleansing - multiple wounds X- 1 5 Wound Imaging (photographs - any number of wounds) []  - 0 Wound Tracing (instead of photographs) []  - 0 Simple Wound Measurement - one wound X- 3 5 Complex Wound Measurement - multiple wounds INTERVENTIONS - Wound Dressings X - Small Wound Dressing one or multiple wounds 2 10 X- 1 15 Medium Wound Dressing one or multiple wounds []  - 0 Large Wound Dressing one or multiple wounds X- 1 5 Application of Medications - topical []  - 0 Application of Medications - injection INTERVENTIONS - Miscellaneous []  - 0 External ear exam []  - 0 Specimen Collection (cultures, biopsies, blood, body fluids, etc.) []  - 0 Specimen(s) / Culture(s) sent or taken to Lab for analysis []  - 0 Patient Transfer (multiple staff / Civil Service fast streamer / Similar devices) []  - 0 Simple Staple / Suture removal (25 or less) []  - 0 Complex Staple / Suture removal (26 or more) []  - 0 Hypo / Hyperglycemic Management (close monitor of Blood Glucose) []  - 0 Ankle / Brachial Index (ABI) - do not check if billed separately X- 1 5 Vital Signs Has the patient been seen at the hospital within the last three years: Yes Total Score: 145 Level Of Care: New/Established - Level 4 Electronic Signature(s) Signed: 06/24/2020 5:05:11 PM By: Carlene Coria RN Entered By: Carlene Coria on 06/24/2020 09:07:01 -------------------------------------------------------------------------------- Encounter Discharge Information Details Patient Name: Date of Service: [name] 06/24/2020 8:30 A M Medical Record Number: [id number] Patient Account Number: [id number] Date of Birth/Sex: Treating RN: 1956/10/06 (64 y.o. Marvis Repress Primary Care Keyshaun Exley: [name] Other Clinician: Referring Fayola Meckes: Treating  Arnulfo Batson/Extender: Lowella Petties in Treatment: 2 Encounter Discharge Information Items Discharge Condition: Stable Ambulatory Status: Ambulatory Discharge Destination: Home Transportation: Private Auto Accompanied By: self Schedule Follow-up Appointment: Yes Clinical Summary of Care: Patient Declined Electronic Signature(s) Signed: 06/24/2020 5:12:21 PM By: Kela Millin Entered By: Kela Millin on 06/24/2020 09:15:42 -------------------------------------------------------------------------------- Lower Extremity Assessment Details Patient Name: Date of Service: FROYLAN, HOBBY EL L. 06/24/2020 8:30 A M Medical Record Number: [id number] Patient Account Number: [id number] Date of Birth/Sex: Treating RN: 02-Jan-1956 (64 y.o. Ernestene Mention Primary Care Evamarie Raetz: [name] Other Clinician: Referring Jakeem Grape: Treating Meredyth Hornung/Extender: Lowella Petties in Treatment: 2 Edema Assessment Assessed: Shirlyn Goltz: No] Patrice Paradise: No] Edema: [Left: N] [Right: o] Calf Left: Right: Point of Measurement: 32 cm From Medial Instep cm 40 cm Ankle Left: Right: Point of Measurement: 14 cm From Medial Instep cm 27 cm Electronic Signature(s) Signed: 06/24/2020 5:26:04 PM By: Baruch Gouty RN, BSN Entered By: Baruch Gouty on 06/24/2020 08:37:19 -------------------------------------------------------------------------------- [place] Details Patient Name: Date of Service: [name] 06/24/2020 8:30 A M Medical Record Number: [id number] Patient Account Number: [id number] Date of Birth/Sex: Treating RN: Mar 01, 1956 (64 y.o. [name] Primary Care Victoriya Pol: [name] Other Clinician: Referring Lovada Barwick: Treating Naquisha Whitehair/Extender: Lowella Petties in Treatment: 2 Active Inactive Abuse / Safety / Falls / Self Care Management Nursing Diagnoses: Potential for falls Potential for injury related  to falls Goals: Patient will remain injury free related to falls Date Initiated: 06/05/2020 Target Resolution Date: 07/04/2020 Goal Status: Active Patient/caregiver will verbalize/demonstrate measures taken to prevent injury and/or falls Date Initiated: 06/05/2020 Target Resolution Date: 07/04/2020 Goal Status: Active Interventions: Assess Activities of Daily Living upon admission and as needed Assess fall risk on admission and as needed Assess: immobility, friction, shearing, incontinence upon admission and as needed Assess impairment of  mobility on admission and as needed per policy Assess personal safety and home safety (as indicated) on admission and as needed Assess self care needs on admission and as needed Provide education on fall prevention Provide education on personal and home safety Notes: Nutrition Nursing Diagnoses: Impaired glucose control: actual or potential Potential for alteratiion in Nutrition/Potential for imbalanced nutrition Goals: Patient/caregiver agrees to and verbalizes understanding of need to use nutritional supplements and/or vitamins as prescribed Date Initiated: 06/05/2020 Target Resolution Date: 07/04/2020 Goal Status: Active Patient/caregiver will maintain therapeutic glucose control Date Initiated: 06/05/2020 Target Resolution Date: 07/04/2020 Goal Status: Active Interventions: Assess HgA1c results as ordered upon admission and as needed Assess patient nutrition upon admission and as needed per policy Provide education on elevated blood sugars and impact on wound healing Provide education on nutrition Treatment Activities: Education provided on Nutrition : 06/05/2020 Notes: Wound/Skin Impairment Nursing Diagnoses: Impaired tissue integrity Knowledge deficit related to ulceration/compromised skin integrity Goals: Patient/caregiver will verbalize understanding of skin care regimen Date Initiated: 06/05/2020 Target Resolution Date: 07/04/2020 Goal  Status: Active Interventions: Assess patient/caregiver ability to obtain necessary supplies Assess patient/caregiver ability to perform ulcer/skin care regimen upon admission and as needed Assess ulceration(s) every visit Provide education on ulcer and skin care Notes: Electronic Signature(s) Signed: 06/24/2020 5:05:11 PM By: Carlene Coria RN Entered By: Carlene Coria on 06/24/2020 08:47:38 -------------------------------------------------------------------------------- Pain Assessment Details Patient Name: Date of Service: KULE, GASCOIGNE 06/24/2020 8:30 A M Medical Record Number: [id number] Patient Account Number: [id number] Date of Birth/Sex: Treating RN: March 06, 1956 (64 y.o. Ernestene Mention Primary Care Kristoffer Bala: [name] Other Clinician: Referring Milt Coye: Treating Caydence Koenig/Extender: Lowella Petties in Treatment: 2 Active Problems Location of Pain Severity and Description of Pain Patient Has Paino Yes Site Locations Pain Location: Generalized Pain, Pain in Ulcers With Dressing Change: Yes Duration of the Pain. Constant / Intermittento Intermittent Rate the pain. Current Pain Level: 6 Worst Pain Level: 9 Least Pain Level: 0 Character of Pain Describe the Pain: Burning, Other: itching Pain Management and Medication Current Pain Management: Medication: Yes Other: reposition Is the Current Pain Management Adequate: Adequate How does your wound impact your activities of daily livingo Sleep: Yes Bathing: No Appetite: No Relationship With Others: No Bladder Continence: No Emotions: Yes Bowel Continence: No Work: No Toileting: No Drive: No Dressing: No Hobbies: No Electronic Signature(s) Signed: 06/24/2020 5:26:04 PM By: Baruch Gouty RN, BSN Entered By: Baruch Gouty on 06/24/2020 08:37:04 -------------------------------------------------------------------------------- Patient/Caregiver Education Details Patient Name: Date of  Service: Ladona Mow 8/10/2021andnbsp8:30 [place] Record Number: [id number] Patient Account Number: [id number] Date of Birth/Gender: Treating RN: 18-Dec-1955 (64 y.o. [name] Primary Care Physician: [name] Other Clinician: Referring Physician: Treating Physician/Extender: Lowella Petties in Treatment: 2 Education Assessment Education Provided To: Patient Education Topics Provided Wound/Skin Impairment: Methods: Explain/Verbal Responses: State content correctly Electronic Signature(s) Signed: 06/24/2020 5:05:11 PM By: Carlene Coria RN Entered By: Carlene Coria on 06/24/2020 08:47:57 -------------------------------------------------------------------------------- Wound Assessment Details Patient Name: Date of Service: CAILLOU, MINUS EL L. 06/24/2020 8:30 A M Medical Record Number: [id number] Patient Account Number: [id number] Date of Birth/Sex: Treating RN: 17-Nov-1955 (63 y.o. Ernestene Mention Primary Care Margie Urbanowicz: [name] Other Clinician: Referring Aleiyah Halpin: Treating Gerome Kokesh/Extender: Lowella Petties in Treatment: 2 Wound Status Wound Number: 1 Primary Venous Leg Ulcer Etiology: Wound Location: Right, Lateral Lower Leg Wound Open Wounding Event: Gradually Appeared Status: Date Acquired: 01/23/2020 Comorbid Cataracts, Sleep Apnea, Congestive Heart Failure, Hypertension, Weeks Of  Treatment: 2 History: Hepatitis C, Type II Diabetes, End Stage Renal Disease, Clustered Wound: No Neuropathy Wound Measurements Length: (cm) 0.8 Width: (cm) 0.4 Depth: (cm) 0.1 Area: (cm) 0.251 Volume: (cm) 0.025 Wound Description Classification: Full Thickness Without Exposed Support Structu Wound Margin: Flat and Intact Exudate Amount: Small Exudate Type: Serosanguineous Exudate Color: red, brown Foul Odor After Cleansing: Slough/Fibrino % Reduction in Area: 82.2% % Reduction in Volume: 82.3% Epithelialization:  Small (1-33%) Tunneling: No Undermining: No res No Yes Wound Bed Granulation Amount: Large (67-100%) Exposed Structure Granulation Quality: Red, Pink Fascia Exposed: No Necrotic Amount: None Present (0%) Fat Layer (Subcutaneous Tissue) Exposed: Yes Tendon Exposed: No Muscle Exposed: No Joint Exposed: No Bone Exposed: No Treatment Notes Wound #1 (Right, Lateral Lower Leg) 1. Cleanse With Wound Cleanser 2. Periwound Care Skin Prep 3. Primary Dressing Applied Calcium Alginate Ag 4. Secondary Dressing Foam Border Dressing Electronic Signature(s) Signed: 06/24/2020 5:26:04 PM By: Baruch Gouty RN, BSN Entered By: Baruch Gouty on 06/24/2020 08:34:40 -------------------------------------------------------------------------------- Wound Assessment Details Patient Name: Date of Service: [name] 06/24/2020 8:30 A M Medical Record Number: [id number] Patient Account Number: [id number] Date of Birth/Sex: Treating RN: 10-26-1956 (64 y.o. Ernestene Mention Primary Care Alejandra Hunt: [name] Other Clinician: Referring Kobey Sides: Treating Massiah Minjares/Extender: Lowella Petties in Treatment: 2 Wound Status Wound Number: 3 Primary Pressure Ulcer Etiology: Wound Location: Right, Distal Gluteus Wound Open Wounding Event: Gradually Appeared Status: Date Acquired: 03/15/2020 Comorbid Cataracts, Sleep Apnea, Congestive Heart Failure, Hypertension, Weeks Of Treatment: 2 History: Hepatitis C, Type II Diabetes, End Stage Renal Disease, Clustered Wound: No Neuropathy Wound Measurements Length: (cm) 6.2 Width: (cm) 6.4 Depth: (cm) 1.5 Area: (cm) 31.165 Volume: (cm) 46.747 % Reduction in Area: -34.1% % Reduction in Volume: 4.2% Epithelialization: Small (1-33%) Tunneling: No Undermining: No Wound Description Classification: Unstageable/Unclassified Wound Margin: Flat and Intact Exudate Amount: Medium Exudate Type: Serosanguineous Exudate Color:  red, brown Wound Bed Granulation Amount: Small (1-33%) Granulation Quality: Pink Necrotic Amount: Large (67-100%) Necrotic Quality: Adherent Slough Foul Odor After Cleansing: No Slough/Fibrino Yes Exposed Structure Fascia Exposed: No Fat Layer (Subcutaneous Tissue) Exposed: Yes Tendon Exposed: No Muscle Exposed: No Joint Exposed: No Bone Exposed: No Assessment Notes indurated Treatment Notes Wound #3 (Right, Distal Gluteus) 1. Cleanse With Wound Cleanser 2. Periwound Care Skin Prep 3. Primary Dressing Applied Santyl 4. Secondary Dressing Dry Gauze Foam Border Dressing Electronic Signature(s) Signed: 06/24/2020 5:26:04 PM By: Baruch Gouty RN, BSN Entered By: Baruch Gouty on 06/24/2020 08:35:26 -------------------------------------------------------------------------------- Wound Assessment Details Patient Name: Date of Service: [name] 06/24/2020 8:30 A M Medical Record Number: [id number] Patient Account Number: [id number] Date of Birth/Sex: Treating RN: 06/03/1956 (65 y.o. Ernestene Mention Primary Care Jkwon Treptow: [name] Other Clinician: Referring Junetta Hearn: Treating Mary Secord/Extender: Lowella Petties in Treatment: 2 Wound Status Wound Number: 4 Primary Atypical Etiology: Wound Location: Right, Medial Gluteus Wound Open Wounding Event: Gradually Appeared Status: Date Acquired: 06/24/2020 Comorbid Cataracts, Sleep Apnea, Congestive Heart Failure, Hypertension, Weeks Of Treatment: 0 History: Hepatitis C, Type II Diabetes, End Stage Renal Disease, Clustered Wound: Yes Neuropathy Wound Measurements Length: (cm) 5.5 Width: (cm) 3.5 Depth: (cm) 0.1 Clustered Quantity: 5 Area: (cm) 15.1 Volume: (cm) 1.51 % Reduction in Area: % Reduction in Volume: Epithelialization: Medium (34-66%) Tunneling: No 19 Undermining: No 2 Wound Description Classification: Full Thickness Without Exposed Support Stru Wound Margin: Flat  and Intact Exudate Amount: Small Exudate Type: Serosanguineous Exudate Color: red, brown ctures Foul Odor After Cleansing: No Slough/Fibrino  Yes Wound Bed Granulation Amount: Small (1-33%) Exposed Structure Granulation Quality: Red Fascia Exposed: No Necrotic Amount: Large (67-100%) Fat Layer (Subcutaneous Tissue) Exposed: Yes Necrotic Quality: Adherent Slough Tendon Exposed: No Muscle Exposed: No Joint Exposed: No Bone Exposed: No Treatment Notes Wound #4 (Right, Medial Gluteus) 1. Cleanse With Wound Cleanser 2. Periwound Care Skin Prep 3. Primary Dressing Applied Santyl 4. Secondary Dressing Dry Gauze Foam Border Dressing Electronic Signature(s) Signed: 06/24/2020 5:26:04 PM By: Baruch Gouty RN, BSN Entered By: Baruch Gouty on 06/24/2020 08:33:05 -------------------------------------------------------------------------------- [place] Details Patient Name: Date of Service: [name] 06/24/2020 8:30 A M Medical Record Number: [id number] Patient Account Number: [id number] Date of Birth/Sex: Treating RN: 1955/12/18 (64 y.o. Ernestene Mention Primary Care Yenifer Saccente: [name] Other Clinician: Referring Tay Whitwell: Treating Hien Cunliffe/Extender: Lowella Petties in Treatment: 2 Vital Signs Time Taken: 08:18 Temperature (F): 98.0 Height (in): 73 Pulse (bpm): 84 Source: Stated Respiratory Rate (breaths/min): 20 Weight (lbs): 250 Blood Pressure (mmHg): 138/88 Source: Stated Reference Range: 80 - 120 mg / dl Body Mass Index (BMI): 33 Electronic Signature(s) Signed: 06/24/2020 5:26:04 PM By: Baruch Gouty RN, BSN Entered By: Baruch Gouty on 06/24/2020 08:25:46

## 2020-07-01 ENCOUNTER — Encounter (HOSPITAL_BASED_OUTPATIENT_CLINIC_OR_DEPARTMENT_OTHER): Payer: Medicare (Managed Care) | Admitting: Internal Medicine

## 2020-07-01 DIAGNOSIS — L98412 Non-pressure chronic ulcer of buttock with fat layer exposed: Secondary | ICD-10-CM | POA: Diagnosis not present

## 2020-07-01 DIAGNOSIS — L97812 Non-pressure chronic ulcer of other part of right lower leg with fat layer exposed: Secondary | ICD-10-CM | POA: Diagnosis not present

## 2020-07-01 DIAGNOSIS — E119 Type 2 diabetes mellitus without complications: Secondary | ICD-10-CM | POA: Diagnosis not present

## 2020-07-01 DIAGNOSIS — L959 Vasculitis limited to the skin, unspecified: Secondary | ICD-10-CM | POA: Diagnosis not present

## 2020-07-01 DIAGNOSIS — I87331 Chronic venous hypertension (idiopathic) with ulcer and inflammation of right lower extremity: Secondary | ICD-10-CM | POA: Diagnosis not present

## 2020-07-03 NOTE — Progress Notes (Signed)
[name] ([id number]) Visit Report for 07/01/2020 Debridement Details Patient Name: Date of Service: [name], [name] 07/01/2020 8:30 A M Medical Record Number: [id number] Patient Account Number: [id number] Date of Birth/Sex: Treating RN: 05-18-1956 (64 y.o. [name] Primary Care Provider: Antony Duran Other Clinician: Referring Provider: Treating Provider/Extender: [name] in Treatment: 3 Debridement Performed for Assessment: Wound #4 Right,Medial Gluteus Performed By: Clinician Nicholas Hurst, RN Debridement Type: Chemical/Enzymatic/Mechanical Agent Used: Santyl Level of Consciousness (Pre-procedure): Awake and Alert Pre-procedure Verification/Time Out No Taken: Bleeding: None Response to Treatment: Procedure was tolerated well Level of Consciousness (Post- Awake and Alert procedure): Post Debridement Measurements of Total Wound Length: (cm) 6 Width: (cm) 2.9 Depth: (cm) 0.1 Volume: (cm) 1.367 Character of Wound/Ulcer Post Debridement: Requires Further Debridement Post Procedure Diagnosis Same as Pre-procedure Electronic Signature(s) Signed: 07/01/2020 6:57:29 PM By: Nicholas Ham MD Signed: 07/03/2020 5:36:57 PM By: Nicholas Hurst RN, BSN Entered By: [name] on 07/01/2020 09:46:39 -------------------------------------------------------------------------------- Debridement Details Patient Name: Date of Service: Nicholas Sheen EL L. 07/01/2020 8:30 A M Medical Record Number: [id number] Patient Account Number: [id number] Date of Birth/Sex: Treating RN: 12-04-1955 (64 y.o. [name] Primary Care Provider: Antony Duran Other Clinician: Referring Provider: Treating Provider/Extender: [name] in Treatment: 3 Debridement Performed for Assessment: Wound #3 Right,Distal Gluteus Performed By: Clinician Nicholas Hurst, RN Debridement Type: Chemical/Enzymatic/Mechanical Agent Used: Santyl Level  of Consciousness (Pre-procedure): Awake and Alert Pre-procedure Verification/Time Out No Taken: Bleeding: None Response to Treatment: Procedure was tolerated well Level of Consciousness (Post- Awake and Alert procedure): Post Debridement Measurements of Total Wound Length: (cm) 6 Stage: Unstageable/Unclassified Width: (cm) 6.4 Depth: (cm) 1.7 Volume: (cm) 51.271 Character of Wound/Ulcer Post Debridement: Requires Further Debridement Post Procedure Diagnosis Same as Pre-procedure Electronic Signature(s) Signed: 07/01/2020 6:57:29 PM By: Nicholas Ham MD Signed: 07/03/2020 5:36:57 PM By: Nicholas Hurst RN, BSN Entered By: [name] on 07/01/2020 09:46:50 -------------------------------------------------------------------------------- HPI Details Patient Name: Date of Service: Nicholas Sheen EL L. 07/01/2020 8:30 A M Medical Record Number: [id number] Patient Account Number: [id number] Date of Birth/Sex: Treating RN: 1956/09/21 (64 y.o. [name] Primary Care Provider: Antony Duran Other Clinician: Referring Provider: Treating Provider/Extender: [name] in Treatment: 3 History of Present Illness HPI Description: ADMISSION 06/05/2020 This is a 64 year old man who has been on dialysis secondary to diabetes for about 5 years. He was recently seen by Dr. Amalia Hailey I think for routine and prophylactic nail care and foot care. He mentioned that he had in this area on the right lateral leg and he was referred here for review of this. He has been using gentamicin cream on the wound. He also uses a cam boot for Charcot ankle. He tells me that he does not have a wound on his left foot or ankle. Almost as a side he mentioned that he had a wound on the right gluteal area. He states that this wound had been present since he was hospitalized on 2 different occasions in March. Firstly from 2/28 through 3/6 for Covid and strep bacteremia. He was rehospitalized from  3/9 through 3/12 secondary to hypotension and hypothermia. It was notable during that admission that he had a wound on the right lateral leg they did not mention the area on the right gluteal. In any case the patient is using gentamicin on this area as well his wife changes the dressing. Past medical history; end-stage renal disease on dialysis secondary to type 2 diabetes, hypertension,  hyperlipidemia Charcot ankle on the left foot and ankle, he is an ex-smoker 10 years ago remote history of hepatitis C and congestive heart failure. The patient had vascular studies on 05/01/2020 this showed ABIs on the right at 2.06 but with triphasic and biphasic waveforms his TBI was actually elevated at 1.25. He did not allow the cam boot to be removed and therefore arterial studies on the left were not done 8/10-Patient returns to clinic has continued discomfort in the right gluteal ischial area where he has a wound and less discomfort the right lateral leg wound. We have been using Santyl to these areas. He was biopsied at last visit and the results indicate inflamed granulation tissue and vasculopathy-the latter seems consistent with calciphylaxis and given the location of wound not clearly pressure etiology this may well be the case 8/17; I have reviewed the pathology report from the punch biopsies I did a few weeks ago. Calciphylaxis was not seen in these sections there was fibrinoid necrosis at the edges. They suggested clinical correlation and/or additional punch biopsies of calciphylaxis is strongly suspected. We are using Santyl to the area on the right buttock. As well as an area on the right leg which I think is a venous insufficiency ulcer, we have been using silver alginate and this is just about healed Electronic Signature(s) Signed: 07/01/2020 6:57:29 PM By: Nicholas Ham MD Entered By: [name] on 07/01/2020  09:32:25 -------------------------------------------------------------------------------- Physical Exam Details Patient Name: Date of Service: Nicholas Sheen EL L. 07/01/2020 8:30 A M Medical Record Number: [id number] Patient Account Number: [id number] Date of Birth/Sex: Treating RN: 06-11-1956 (64 y.o. [name] Primary Care Provider: Antony Duran Other Clinician: Referring Provider: Treating Provider/Extender: [name] in Treatment: 3 Constitutional Sitting or standing Blood Pressure is within target range for patient.. Pulse regular and within target range for patient.Marland Kitchen Respirations regular, non-labored and within target range.. Temperature is normal and within the target range for the patient.Marland Kitchen Appears in no distress. Notes Wound exam; Right ischial/gluteal wound with 2 small open areas which I think were my original punch biopsies. The wound surface looks somewhat better however I am concerned about satellite areas that appear to be breaking down with necrotic small areas around the circumference of this firm area. Very tender. The area on the right lateral leg wound is just about healed. I think this was largely a venous stasis ulcer Electronic Signature(s) Signed: 07/01/2020 6:57:29 PM By: Nicholas Ham MD Entered By: [name] on 07/01/2020 09:39:52 -------------------------------------------------------------------------------- Physician Orders Details Patient Name: Date of Service: Nicholas Sheen EL L. 07/01/2020 8:30 A M Medical Record Number: [id number] Patient Account Number: [id number] Date of Birth/Sex: Treating RN: Nov 22, 1955 (64 y.o. 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 Dressing Change Frequency Wound #1 Right,Lateral Lower Leg Change dressing every day. Wound #3 Right,Distal Gluteus Change dressing every day. Wound #4 Right,Medial Gluteus Change dressing every day. Skin Barriers/Peri-Wound Care Skin Prep Wound Cleansing May shower and wash wound with soap and water. - on days  that dressing is changed Primary Wound Dressing Wound #1 Right,Lateral Lower Leg Calcium Alginate with Silver Wound #3 Right,Distal Gluteus Santyl Ointment Wound #4 Right,Medial Gluteus Santyl Ointment Secondary Dressing Wound #1 Right,Lateral Lower Leg Foam Border - or ABD pad and tape Wound #3 Right,Distal Gluteus Foam Border - or ABD pad and tape Wound #4 Right,Medial Gluteus Foam Border - or ABD pad and tape Patient Medications llergies: penicillin, propolis (bee glue), Vioxx A Notifications Medication Indication Start End 07/01/2020 Santyl DOSE topical 250 unit/gram ointment - ointment topical to wound change daily 6x6.4x1.7 Electronic Signature(s) Signed: 07/01/2020 9:49:55 AM By: Nicholas Ham MD Entered By: [name] on 07/01/2020 09:49:52 -------------------------------------------------------------------------------- Problem List Details Patient Name: Date of Service: Nicholas Sheen EL L. 07/01/2020 8:30 A M Medical Record Number: [id number] Patient Account Number: [id number] Date of Birth/Sex: Treating RN: 02/25/1956 (64 y.o. [name] Primary Care Provider: Antony Duran Other Clinician: Referring Provider: Treating Provider/Extender: [name] in Treatment: 3 Active  Problems ICD-10 Encounter Code Description Active Date MDM Diagnosis I87.331 Chronic venous hypertension (idiopathic) with ulcer and inflammation of right 06/05/2020 No Yes lower extremity L97.212 Non-pressure chronic ulcer of right calf with fat layer exposed 06/05/2020 No Yes L89.310 Pressure ulcer of right buttock, unstageable 06/05/2020 No Yes E11.22 Type 2 diabetes mellitus with diabetic chronic kidney disease 06/05/2020 No Yes L95.9 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 07/01/2020 8:30 A M Medical Record Number: [id number] Patient Account Number: [id number] Date of Birth/Sex: Treating RN: 07-24-1956 (64 y.o. [name] Primary Care Provider: Antony Duran Other Clinician: Referring Provider: Treating Provider/Extender: [name] in Treatment: 3 Subjective History of Present Illness (HPI) ADMISSION 06/05/2020 This is a 64 year old man who has been on dialysis secondary to diabetes for about 5 years. He was recently seen by Dr. Amalia Hailey I think for routine and prophylactic nail care and foot care. He mentioned that he had in this area on the right lateral leg and he was referred here for review of this. He has been using gentamicin cream on the wound. He also uses a cam boot for Charcot ankle. He tells me that he does not have a wound on his left foot or ankle. Almost as a side he mentioned that he had a wound on the right gluteal area. He states that this wound had been present since he was hospitalized on 2 different occasions in March. Firstly from 2/28 through 3/6 for Covid and strep bacteremia. He was rehospitalized from  3/9 through 3/12 secondary to hypotension and hypothermia. It was notable during that admission that he had a wound on the right lateral leg they did not mention the area on the right gluteal. In any case the patient is using gentamicin on this area as well his wife changes the dressing. Past medical history; end-stage renal disease on dialysis secondary to type 2 diabetes, hypertension, hyperlipidemia Charcot ankle on the left foot and ankle, he is an ex-smoker 10 years ago remote history of hepatitis C and congestive heart failure. The patient had vascular studies on 05/01/2020 this showed ABIs on the right at 2.06 but with triphasic and biphasic waveforms his TBI was actually elevated at 1.25. He did not allow the cam boot to be removed and therefore arterial studies on  the left were not done 8/10-Patient returns to clinic has continued discomfort in the right gluteal ischial area where he has a wound and less discomfort the right lateral leg wound. We have been using Santyl to these areas. He was biopsied at last visit and the results indicate inflamed granulation tissue and vasculopathy-the latter seems consistent with calciphylaxis and given the location of wound not clearly pressure etiology this may well be the case 8/17; I have reviewed the pathology report from the punch biopsies I did a few weeks ago. Calciphylaxis was not seen in these sections there was fibrinoid necrosis at the edges. They suggested clinical correlation and/or additional punch biopsies of calciphylaxis is strongly suspected. We are using Santyl to the area on the right buttock. As well as an area on the right leg which I think is a venous insufficiency ulcer, we have been using silver alginate and this is just about healed Objective Constitutional Sitting or standing Blood Pressure is within target range for patient.. Pulse regular and within target range for patient.Marland Kitchen Respirations regular, non-labored and within target  range.. Temperature is normal and within the target range for the patient.Marland Kitchen Appears in no distress. Vitals Time Taken: 8:05 AM, Height: 73 in, Weight: 250 lbs, BMI: 33, Temperature: 98.3 F, Pulse: 99 bpm, Respiratory Rate: 19 breaths/min, Blood Pressure: 117/69 mmHg. General Notes: Wound exam; ooRight ischial/gluteal wound with 2 small open areas which I think were my original punch biopsies. The wound surface looks somewhat better however I am concerned about satellite areas that appear to be breaking down with necrotic small areas around the circumference of this firm area. Very tender. ooThe area on the right lateral leg wound is just about healed. I think this was largely a venous stasis ulcer Integumentary (Hair, Skin) Wound #1 status is Open. Original cause of wound was Gradually Appeared. The wound is located on the Right,Lateral Lower Leg. The wound measures 0.4cm length x 0.3cm width x 0.1cm depth; 0.094cm^2 area and 0.009cm^3 volume. There is Fat Layer (Subcutaneous Tissue) Exposed exposed. There is no tunneling or undermining noted. There is a medium amount of serosanguineous drainage noted. The wound margin is flat and intact. There is large (67-100%) red, pink granulation within the wound bed. There is no necrotic tissue within the wound bed. Wound #3 status is Open. Original cause of wound was Gradually Appeared. The wound is located on the Right,Distal Gluteus. The wound measures 6cm length x 6.4cm width x 1.7cm depth; 30.159cm^2 area and 51.271cm^3 volume. There is Fat Layer (Subcutaneous Tissue) Exposed exposed. There is no tunneling or undermining noted. There is a medium amount of serosanguineous drainage noted. The wound margin is flat and intact. There is small (1-33%) pink granulation within the wound bed. There is a large (67-100%) amount of necrotic tissue within the wound bed including Adherent Slough. Wound #4 status is Open. Original cause of wound was Gradually  Appeared. The wound is located on the Right,Medial Gluteus. The wound measures 6cm length x 2.9cm width x 0.1cm depth; 13.666cm^2 area and 1.367cm^3 volume. There is Fat Layer (Subcutaneous Tissue) Exposed exposed. There is no tunneling or undermining noted. There is a medium amount of serosanguineous drainage noted. The wound margin is flat and intact. There is small (1-33%) pink granulation within the wound bed. There is a large (67-100%) amount of necrotic tissue within the wound bed including Eschar and Adherent Slough. 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 Dressing Change Frequency: Wound #1 Right,Lateral Lower Leg: Change dressing every day. Wound #3 Right,Distal Gluteus: Change dressing every day. Wound #4 Right,Medial Gluteus: Change dressing every day. Skin Barriers/Peri-Wound Care: Skin Prep Wound Cleansing: May shower and wash wound with soap and water. - on days that dressing is changed Primary Wound Dressing: Wound #1 Right,Lateral Lower Leg: Calcium Alginate with Silver Wound #3 Right,Distal Gluteus: Santyl Ointment Wound #4 Right,Medial Gluteus: Santyl Ointment Secondary Dressing: Wound #1 Right,Lateral Lower Leg: Foam Border - or ABD pad and tape Wound #3 Right,Distal Gluteus: Foam Border - or ABD pad and tape Wound #4 Right,Medial Gluteus: Foam Border - or ABD pad and tape The following medication(s) was prescribed: Santyl topical 250 unit/gram ointment ointment topical to wound change daily 6x6.4x1.7 starting 07/01/2020 1. In spite of the inconclusive punch biopsies I did on 7/27 I think this man  likely has calciphylaxis. 2. I am reluctant to rebiopsy these as the areas of the biopsy themselves are not closing. Repeating this would result in open areas 3. I am going to phone the physician who looks after the [facility] dialysis unit and discussed this with that provider. From my point of view empiric use of thiosulfate might be justifiable here. I also wonder whether medication adjustments might be in order including some of the standard dialysis medications such as cinacalcet e Electronic Signature(s) Signed: 07/01/2020 9:50:22 AM By: Nicholas Ham MD Entered By: [name] on 07/01/2020 09:50:21 -------------------------------------------------------------------------------- SuperBill Details Patient Name: Date of Service: Ladona Mow 07/01/2020 Medical Record Number: [id number] Patient Account Number: [id number] Date of Birth/Sex: Treating RN: [birth year] (64 y.o. [name] Primary Care Provider: Antony Duran Other Clinician: Referring Provider: Treating Provider/Extender: [name] in Treatment: 3 Diagnosis Coding ICD-10 Codes Code Description [phone] Chronic venous hypertension (idiopathic) with ulcer and inflammation of right lower extremity L97.212 Non-pressure chronic ulcer of right calf with fat layer exposed L89.310 Pressure ulcer of right buttock, unstageable E11.22 Type 2 diabetes mellitus with diabetic chronic kidney disease L95.9 Vasculitis limited to the skin, unspecified Facility Procedures CPT4 Code: [id number] Description: 16967 - DEBRIDE W/O ANES NON SELECT Modifier: Quantity: 1 Physician Procedures : CPT4 Code Description Modifier [id number] - WC PHYS LEVEL 4 - EST PT ICD-10 Diagnosis Description L89.310 Pressure ulcer of right buttock, unstageable I87.331 Chronic venous hypertension (idiopathic) with ulcer and inflammation of right lower  extremity L95.9 Vasculitis limited to the skin, unspecified  E11.22 Type 2 diabetes mellitus with diabetic chronic kidney disease Quantity: 1 Electronic Signature(s) Signed: 07/01/2020 6:57:29 PM By: Nicholas Ham MD Signed: 07/03/2020 5:36:57 PM By: Nicholas Hurst RN, BSN Entered By: [name] on 07/01/2020 10:30:04

## 2020-07-12 NOTE — Progress Notes (Signed)
Nicholas, Duran ([id number]) Visit Report for 07/01/2020 Arrival Information Details Patient Name: Date of Service: [name], [name] 07/01/2020 8:30 A M Medical Record Number: [id number] Patient Account Number: [id number] Date of Birth/Sex: Treating RN: September 01, 1956 (64 y.o. Marvis Repress Primary Care Jaysiah Marchetta: [name] Other Clinician: Referring Coden Franchi: Treating Brennen Gardiner/Extender: Randon Goldsmith in Treatment: 3 Visit Information History Since Last Visit Added or deleted any medications: No Patient Arrived: Ambulatory Any new allergies or adverse reactions: No Arrival Time: 08:05 Had a fall or experienced change in No Accompanied By: self activities of daily living that may affect Transfer Assistance: None risk of falls: Patient Identification Verified: Yes Signs or symptoms of abuse/neglect since last visito No Secondary Verification Process Completed: Yes Hospitalized since last visit: No Patient Requires Transmission-Based Precautions: No Implantable device outside of the clinic excluding No Patient Has Alerts: Yes cellular tissue based products placed in the center Patient Alerts: Right ABI/TBI: 1.35 since last visit: Has Dressing in Place as Prescribed: Yes Pain Present Now: Yes Electronic Signature(s) Signed: 07/01/2020 5:06:17 PM By: Kela Millin Entered By: Kela Millin on 07/01/2020 08:07:52 -------------------------------------------------------------------------------- Encounter Discharge Information Details Patient Name: Date of Service: Nicholas Sheen EL L. 07/01/2020 8:30 A M Medical Record Number: [id number] Patient Account Number: [id number] Date of Birth/Sex: Treating RN: [birth year] (64 y.o. Marvis Repress Primary Care Ambria Mayfield: [name] Other Clinician: Referring Ashea Winiarski: Treating Mitra Duling/Extender: Randon Goldsmith in Treatment: 3 Encounter Discharge Information Items Post Procedure  Vitals Discharge Condition: Stable Temperature (F): 98.3 Ambulatory Status: Ambulatory Pulse (bpm): 99 Discharge Destination: Home Respiratory Rate (breaths/min): 19 Transportation: Other Blood Pressure (mmHg): 117/69 Accompanied By: self Schedule Follow-up Appointment: Yes Clinical Summary of Care: Patient Declined Electronic Signature(s) Signed: 07/01/2020 5:06:17 PM By: Kela Millin Entered By: Kela Millin on 07/01/2020 09:50:50 -------------------------------------------------------------------------------- Lower Extremity Assessment Details Patient Name: Date of Service: Nicholas, [name] 07/01/2020 8:30 A M Medical Record Number: [id number] Patient Account Number: [id number] Date of Birth/Sex: Treating RN: January 22, 1956 (64 y.o. Marvis Repress Primary Care Alzada Brazee: [name] Other Clinician: Referring Ninah Moccio: Treating Estelita Iten/Extender: Randon Goldsmith in Treatment: 3 Edema Assessment Assessed: Shirlyn Goltz: No] Patrice Paradise: No] Edema: [Left: N] [Right: o] Calf Left: Right: Point of Measurement: 32 cm From Medial Instep cm 40 cm Ankle Left: Right: Point of Measurement: 14 cm From Medial Instep cm 27 cm Vascular Assessment Pulses: Dorsalis Pedis Palpable: [Right:Yes] Electronic Signature(s) Signed: 07/01/2020 5:06:17 PM By: Kela Millin Entered By: Kela Millin on 07/01/2020 08:08:02 -------------------------------------------------------------------------------- Multi Wound Chart Details Patient Name: Date of Service: Nicholas Sheen EL L. 07/01/2020 8:30 A M Medical Record Number: [id number] Patient Account Number: [id number] Date of Birth/Sex: Treating RN: 07/14/1956 (64 y.o. [name] Primary Care Braian Tijerina: [name] Other Clinician: Referring Lin Hackmann: Treating Saren Corkern/Extender: Randon Goldsmith in Treatment: 3 Vital Signs Height(in): 20 Pulse(bpm): 79 Weight(lbs): 250 Blood  Pressure(mmHg): 117/69 Body Mass Index(BMI): 33 Temperature(F): 98.3 Respiratory Rate(breaths/min): 19 Photos: [1:No Photos Right, Lateral Lower Leg] [3:No Photos Right, Distal Gluteus] [4:No Photos Right, Medial Gluteus] Wound Location: [1:Gradually Appeared] [3:Gradually Appeared] [4:Gradually Appeared] Wounding Event: [1:Venous Leg Ulcer] [3:Pressure Ulcer] [4:Atypical] Primary Etiology: [1:Cataracts, Sleep Apnea, Congestive] [3:Cataracts, Sleep Apnea, Congestive] [4:Cataracts, Sleep Apnea, Congestive] Comorbid History: [1:Heart Failure, Hypertension, Hepatitis C, Type II Diabetes, End Stage Renal Disease, Neuropathy 01/23/2020] [3:Heart Failure, Hypertension, Hepatitis C, Type II Diabetes, End Stage Renal Disease, Neuropathy 03/15/2020] [4:Heart Failure,  Hypertension, Hepatitis C, Type II Diabetes, End Stage Renal Disease, Neuropathy 06/24/2020]  Date Acquired: [1:3] [3:3] [4:1] Weeks of Treatment: [1:Open] [3:Open] [4:Open] Wound Status: [1:No] [3:No] [4:Yes] Clustered Wound: [1:N/A] [3:N/A] [4:5] Clustered Quantity: [1:0.4x0.3x0.1] [3:6x6.4x1.7] [4:6x2.9x0.1] Measurements L x W x D (cm) [1:0.094] [3:30.159] [4:13.666] A (cm) : rea [1:0.009] [3:51.271] [4:1.367] Volume (cm) : [1:93.40%] [3:-29.80%] [4:9.60%] % Reduction in Area: [1:93.60%] [3:-5.10%] [4:9.60%] % Reduction in Volume: [1:Full Thickness Without Exposed] [3:Unstageable/Unclassified] [4:Full Thickness Without Exposed] Classification: [1:Support Structures Medium] [3:Medium] [4:Support Structures Medium] Exudate A mount: [1:Serosanguineous] [3:Serosanguineous] [4:Serosanguineous] Exudate Type: [1:red, brown] [3:red, brown] [4:red, brown] Exudate Color: [1:Flat and Intact] [3:Flat and Intact] [4:Flat and Intact] Wound Margin: [1:Large (67-100%)] [3:Small (1-33%)] [4:Small (1-33%)] Granulation Amount: [1:Red, Pink] [3:Pink] [4:Pink] Granulation Quality: [1:None Present (0%)] [3:Large (67-100%)] [4:Large (67-100%)] Necrotic  Amount: [1:N/A] [3:Adherent Slough] [4:Eschar, Adherent Slough] Necrotic Tissue: [1:Fat Layer (Subcutaneous Tissue): Yes Fat Layer (Subcutaneous Tissue): Yes Fat Layer (Subcutaneous Tissue): Yes] Exposed Structures: [1:Fascia: No Tendon: No Muscle: No Joint: No Bone: No Small (1-33%)] [3:Fascia: No Tendon: No Muscle: No Joint: No Bone: No Small (1-33%)] [4:Fascia: No Tendon: No Muscle: No Joint: No Bone: No Medium (34-66%)] Treatment Notes Electronic Signature(s) Signed: 07/01/2020 6:57:29 PM By: Linton Ham MD Signed: 07/11/2020 5:50:16 PM By: Carlene Coria RN Entered By: Linton Ham on 07/01/2020 09:30:29 -------------------------------------------------------------------------------- Multi-Disciplinary Care Plan Details Patient Name: Date of Service: Nicholas Sheen EL L. 07/01/2020 8:30 A M Medical Record Number: [id number] Patient Account Number: [id number] Date of Birth/Sex: Treating RN: Aug 17, 1956 (64 y.o. [name] Primary Care Rohaan Durnil: [name] Other Clinician: Referring Denarius Sesler: Treating Elizabeth Paulsen/Extender: Randon Goldsmith in Treatment: 3 Active Inactive Abuse / Safety / Falls / Self Care Management Nursing Diagnoses: Potential for falls Potential for injury related to falls Goals: Patient will remain injury free related to falls Date Initiated: 06/05/2020 Target Resolution Date: 07/04/2020 Goal Status: Active Patient/caregiver will verbalize/demonstrate measures taken to prevent injury and/or falls Date Initiated: 06/05/2020 Target Resolution Date: 07/04/2020 Goal Status: Active Interventions: Assess Activities of Daily Living upon admission and as needed Assess fall risk on admission and as needed Assess: immobility, friction, shearing, incontinence upon admission and as needed Assess impairment of mobility on admission and as needed per policy Assess personal safety and home safety (as indicated) on admission and as needed Assess  self care needs on admission and as needed Provide education on fall prevention Provide education on personal and home safety Notes: Nutrition Nursing Diagnoses: Impaired glucose control: actual or potential Potential for alteratiion in Nutrition/Potential for imbalanced nutrition Goals: Patient/caregiver agrees to and verbalizes understanding of need to use nutritional supplements and/or vitamins as prescribed Date Initiated: 06/05/2020 Target Resolution Date: 07/04/2020 Goal Status: Active Patient/caregiver will maintain therapeutic glucose control Date Initiated: 06/05/2020 Target Resolution Date: 07/04/2020 Goal Status: Active Interventions: Assess HgA1c results as ordered upon admission and as needed Assess patient nutrition upon admission and as needed per policy Provide education on elevated blood sugars and impact on wound healing Provide education on nutrition Treatment Activities: Education provided on Nutrition : 06/05/2020 Notes: Wound/Skin Impairment Nursing Diagnoses: Impaired tissue integrity Knowledge deficit related to ulceration/compromised skin integrity Goals: Patient/caregiver will verbalize understanding of skin care regimen Date Initiated: 06/05/2020 Target Resolution Date: 07/04/2020 Goal Status: Active Interventions: Assess patient/caregiver ability to obtain necessary supplies Assess patient/caregiver ability to perform ulcer/skin care regimen upon admission and as needed Assess ulceration(s) every visit Provide education on ulcer and skin care Notes: Electronic Signature(s) Signed: 07/11/2020 5:50:16 PM By: Carlene Coria RN Entered By: Carlene Coria on 07/01/2020 08:04:14 -------------------------------------------------------------------------------- Pain Assessment  Details Patient Name: Date of Service: BINGHAM, MILLETTE 07/01/2020 8:30 A M Medical Record Number: [id number] Patient Account Number: [id number] Date of Birth/Sex: Treating RN: 07/29/1956  (64 y.o. Marvis Repress Primary Care Serapio Edelson: [name] Other Clinician: Referring Araly Kaas: Treating Loveah Like/Extender: Randon Goldsmith in Treatment: 3 Active Problems Location of Pain Severity and Description of Pain Patient Has Paino Yes Site Locations Pain Location: Pain Location: Pain in Ulcers With Dressing Change: Yes Duration of the Pain. Constant / Intermittento Constant Rate the pain. Current Pain Level: 5 Worst Pain Level: 10 Least Pain Level: 5 Tolerable Pain Level: 5 Character of Pain Describe the Pain: Aching, Burning Pain Management and Medication Current Pain Management: Electronic Signature(s) Signed: 07/01/2020 5:06:17 PM By: Kela Millin Entered By: Kela Millin on 07/01/2020 08:07:45 -------------------------------------------------------------------------------- Patient/Caregiver Education Details Patient Name: Date of Service: Ladona Mow 8/17/2021andnbsp8:30 [place] Record Number: [id number] Patient Account Number: [id number] Date of Birth/Gender: Treating RN: [birth year] (64 y.o. [name] Primary Care Physician: [name] Other Clinician: Referring Physician: Treating Physician/Extender: Randon Goldsmith in Treatment: 3 Education Assessment Education Provided To: Patient Education Topics Provided Nutrition: Methods: Explain/Verbal Responses: State content correctly Electronic Signature(s) Signed: 07/11/2020 5:50:16 PM By: Carlene Coria RN Entered By: Carlene Coria on 07/01/2020 08:04:42 -------------------------------------------------------------------------------- Wound Assessment Details Patient Name: Date of Service: Nicholas Sheen EL L. 07/01/2020 8:30 A M Medical Record Number: [id number] Patient Account Number: [id number] Date of Birth/Sex: Treating RN: [birth year] (64 y.o. Marvis Repress Primary Care Neelie Welshans: Other Clinician: [name] Referring Kaytlynne Neace: Treating Heaton Sarin/Extender: Randon Goldsmith in Treatment: 3 Wound Status Wound Number: 1 Primary Venous Leg Ulcer Etiology: Wound Location: Right, Lateral Lower Leg Wound Open Wounding Event: Gradually Appeared Status: Date Acquired: 01/23/2020 Comorbid Cataracts, Sleep Apnea, Congestive Heart Failure, Hypertension, Weeks Of Treatment: 3 History: Hepatitis C, Type II Diabetes, End Stage Renal Disease, Clustered Wound: No Neuropathy Photos Photo Uploaded By: Mikeal Hawthorne on 07/03/2020 14:38:05 Wound Measurements Length: (cm) 0.4 Width: (cm) 0.3 Depth: (cm) 0.1 Area: (cm) 0.094 Volume: (cm) 0.009 % Reduction in Area: 93.4% % Reduction in Volume: 93.6% Epithelialization: Small (1-33%) Tunneling: No Undermining: No Wound Description Classification: Full Thickness Without Exposed Support Structures Wound Margin: Flat and Intact Exudate Amount: Medium Exudate Type: Serosanguineous Exudate Color: red, brown Foul Odor After Cleansing: No Slough/Fibrino No Wound Bed Granulation Amount: Large (67-100%) Exposed Structure Granulation Quality: Red, Pink Fascia Exposed: No Necrotic Amount: None Present (0%) Fat Layer (Subcutaneous Tissue) Exposed: Yes Tendon Exposed: No Muscle Exposed: No Joint Exposed: No Bone Exposed: No Treatment Notes Wound #1 (Right, Lateral Lower Leg) 1. Cleanse With Wound Cleanser 3. Primary Dressing Applied Calcium Alginate Ag 4. Secondary Dressing Foam Border Dressing Electronic Signature(s) Signed: 07/01/2020 5:06:17 PM By: Kela Millin Entered By: Kela Millin on 07/01/2020 08:13:14 -------------------------------------------------------------------------------- Wound Assessment Details Patient Name: Date of Service: AUNDRAY, CARTLIDGE EL L. 07/01/2020 8:30 A M Medical Record Number: [id number] Patient Account Number: [id number] Date of Birth/Sex: Treating RN: 1956-01-23 (64 y.o. Marvis Repress Primary Care Karyssa Amaral: [name] Other Clinician: Referring Petrina Melby: Treating Tallan Sandoz/Extender: Randon Goldsmith in Treatment: 3 Wound Status Wound Number: 3 Primary Pressure Ulcer Etiology: Wound Location: Right, Distal Gluteus Wound Open Wounding Event: Gradually Appeared Status: Date Acquired: 03/15/2020 Comorbid Cataracts, Sleep Apnea, Congestive Heart Failure, Hypertension, Weeks Of Treatment: 3 History: Hepatitis C, Type II Diabetes, End Stage Renal Disease, Clustered Wound: No Neuropathy Photos Photo Uploaded By: Mikeal Hawthorne on  07/03/2020 14:38:06 Wound Measurements Length: (cm) 6 % Re Width: (cm) 6.4 % Re Depth: (cm) 1.7 Epit Area: (cm) 30.159 Tun Volume: (cm) 51.271 Und duction in Area: -29.8% duction in Volume: -5.1% helialization: Small (1-33%) neling: No ermining: No Wound Description Classification: Unstageable/Unclassified Fou Wound Margin: Flat and Intact Slo Exudate Amount: Medium Exudate Type: Serosanguineous Exudate Color: red, brown l Odor After Cleansing: No ugh/Fibrino Yes Wound Bed Granulation Amount: Small (1-33%) Exposed Structure Granulation Quality: Pink Fascia Exposed: No Necrotic Amount: Large (67-100%) Fat Layer (Subcutaneous Tissue) Exposed: Yes Necrotic Quality: Adherent Slough Tendon Exposed: No Muscle Exposed: No Joint Exposed: No Bone Exposed: No Treatment Notes Wound #3 (Right, Distal Gluteus) 1. Cleanse With Wound Cleanser 2. Periwound Care Skin Prep 3. Primary Dressing Applied Santyl 4. Secondary Dressing ABD Pad Dry Gauze 5. Secured With Recruitment consultant) Signed: 07/01/2020 5:06:17 PM By: Kela Millin Entered By: Kela Millin on 07/01/2020 08:19:40 -------------------------------------------------------------------------------- Wound Assessment Details Patient Name: Date of Service: ANGELL, HONSE EL L. 07/01/2020 8:30 A M Medical Record Number:  [id number] Patient Account Number: [id number] Date of Birth/Sex: Treating RN: 09-Jun-1956 (64 y.o. Marvis Repress Primary Care Jarious Lyon: [name] Other Clinician: Referring Candence Sease: Treating Keanon Bevins/Extender: Randon Goldsmith in Treatment: 3 Wound Status Wound Number: 4 Primary Atypical Etiology: Wound Location: Right, Medial Gluteus Wound Open Wounding Event: Gradually Appeared Status: Date Acquired: 06/24/2020 Comorbid Cataracts, Sleep Apnea, Congestive Heart Failure, Hypertension, Weeks Of Treatment: 1 History: Hepatitis C, Type II Diabetes, End Stage Renal Disease, Clustered Wound: Yes Neuropathy Photos Photo Uploaded By: Mikeal Hawthorne on 07/03/2020 14:38:22 Wound Measurements Length: (cm) 6 Width: (cm) 2.9 Depth: (cm) 0.1 Clustered Quantity: 5 Area: (cm) 13.666 Volume: (cm) 1.367 % Reduction in Area: 9.6% % Reduction in Volume: 9.6% Epithelialization: Medium (34-66%) Tunneling: No Undermining: No Wound Description Classification: Full Thickness Without Exposed Support Structures Wound Margin: Flat and Intact Exudate Amount: Medium Exudate Type: Serosanguineous Exudate Color: red, brown Foul Odor After Cleansing: No Slough/Fibrino Yes Wound Bed Granulation Amount: Small (1-33%) Exposed Structure Granulation Quality: Pink Fascia Exposed: No Necrotic Amount: Large (67-100%) Fat Layer (Subcutaneous Tissue) Exposed: Yes Necrotic Quality: Eschar, Adherent Slough Tendon Exposed: No Muscle Exposed: No Joint Exposed: No Bone Exposed: No Treatment Notes Wound #4 (Right, Medial Gluteus) 1. Cleanse With Wound Cleanser 2. Periwound Care Skin Prep 3. Primary Dressing Applied Santyl 4. Secondary Dressing ABD Pad Dry Gauze 5. Secured With Recruitment consultant) Signed: 07/01/2020 5:06:17 PM By: Kela Millin Entered By: Kela Millin on 07/01/2020  08:19:02 -------------------------------------------------------------------------------- Vitals Details Patient Name: Date of Service: Nicholas Sheen EL L. 07/01/2020 8:30 A M Medical Record Number: [id number] Patient Account Number: [id number] Date of Birth/Sex: Treating RN: 05/02/1956 (64 y.o. Marvis Repress Primary Care Kathrin Folden: [name] Other Clinician: Referring Jarmar Rousseau: Treating Ardyth Kelso/Extender: Randon Goldsmith in Treatment: 3 Vital Signs Time Taken: 08:05 Temperature (F): 98.3 Height (in): 73 Pulse (bpm): 99 Weight (lbs): 250 Respiratory Rate (breaths/min): 19 Body Mass Index (BMI): 33 Blood Pressure (mmHg): 117/69 Reference Range: 80 - 120 mg / dl Electronic Signature(s) Signed: 07/01/2020 5:06:17 PM By: Kela Millin Entered By: Kela Millin on 07/01/2020 08:07:27

## 2020-07-15 ENCOUNTER — Encounter (HOSPITAL_BASED_OUTPATIENT_CLINIC_OR_DEPARTMENT_OTHER): Payer: Medicare (Managed Care) | Admitting: Internal Medicine

## 2020-07-15 DIAGNOSIS — E119 Type 2 diabetes mellitus without complications: Secondary | ICD-10-CM | POA: Diagnosis not present

## 2020-07-15 DIAGNOSIS — I87331 Chronic venous hypertension (idiopathic) with ulcer and inflammation of right lower extremity: Secondary | ICD-10-CM | POA: Diagnosis not present

## 2020-07-15 DIAGNOSIS — L97212 Non-pressure chronic ulcer of right calf with fat layer exposed: Secondary | ICD-10-CM | POA: Diagnosis not present

## 2020-07-15 DIAGNOSIS — L89310 Pressure ulcer of right buttock, unstageable: Secondary | ICD-10-CM | POA: Diagnosis not present

## 2020-07-16 NOTE — Progress Notes (Signed)
HERMILO, DUTTER ([id number]) Visit Report for 07/15/2020 Debridement Details Patient Name: Date of Service: [name], [name] 07/15/2020 3:00 PM Medical Record Number: [id number] Patient Account Number: [id number] Date of Birth/Sex: Treating RN: 1956-07-28 (64 y.o. [name] Primary Care Provider: [name] Other Clinician: Referring Provider: Treating Provider/Extender: Randon Goldsmith in Treatment: 5 Debridement Performed for Assessment: Wound #3 Right,Distal Gluteus Performed By: Clinician Carlene Coria, RN Debridement Type: Chemical/Enzymatic/Mechanical Agent Used: Santyl Level of Consciousness (Pre-procedure): Awake and Alert Pre-procedure Verification/Time Out Yes - 15:56 Taken: Start Time: 15:56 Pain Control: Lidocaine 5% topical ointment Bleeding: None End Time: 15:58 Procedural Pain: 0 Post Procedural Pain: 0 Response to Treatment: Procedure was tolerated well Level of Consciousness (Post- Awake and Alert procedure): Post Debridement Measurements of Total Wound Length: (cm) 7.3 Stage: Unstageable/Unclassified Width: (cm) 3.5 Depth: (cm) 2 Volume: (cm) 40.134 Character of Wound/Ulcer Post Debridement: Requires Further Debridement Post Procedure Diagnosis Same as Pre-procedure Electronic Signature(s) Signed: 07/15/2020 5:08:08 PM By: Linton Ham MD Signed: 07/16/2020 10:37:46 AM By: Carlene Coria RN Entered By: Carlene Coria on 07/15/2020 16:49:52 -------------------------------------------------------------------------------- Debridement Details Patient Name: Date of Service: Nicholas Sheen EL L. 07/15/2020 3:00 PM Medical Record Number: [id number] Patient Account Number: [id number] Date of Birth/Sex: Treating RN: 08/14/1956 (64 y.o. [name] Primary Care Provider: [name] Other Clinician: Referring Provider: Treating Provider/Extender: Randon Goldsmith in Treatment: 5 Debridement Performed for  Assessment: Wound #4 Right,Medial Gluteus Performed By: Clinician Carlene Coria, RN Debridement Type: Chemical/Enzymatic/Mechanical Agent Used: Santyl Level of Consciousness (Pre-procedure): Awake and Alert Pre-procedure Verification/Time Out Yes - 15:56 Taken: Start Time: 15:56 Pain Control: Lidocaine 5% topical ointment Bleeding: None End Time: 15:58 Procedural Pain: 0 Post Procedural Pain: 0 Response to Treatment: Procedure was tolerated well Level of Consciousness (Post- Awake and Alert procedure): Post Debridement Measurements of Total Wound Length: (cm) 1.4 Width: (cm) 2.5 Depth: (cm) 0.3 Volume: (cm) 0.825 Character of Wound/Ulcer Post Debridement: Requires Further Debridement Post Procedure Diagnosis Same as Pre-procedure Electronic Signature(s) Signed: 07/15/2020 5:08:08 PM By: Linton Ham MD Signed: 07/16/2020 10:37:46 AM By: Carlene Coria RN Entered By: Carlene Coria on 07/15/2020 16:50:22 -------------------------------------------------------------------------------- HPI Details Patient Name: Date of Service: Nicholas Sheen EL L. 07/15/2020 3:00 PM Medical Record Number: [id number] Patient Account Number: [id number] Date of Birth/Sex: Treating RN: Feb 09, 1956 (64 y.o. [name] Primary Care Provider: [name] Other Clinician: Referring Provider: Treating Provider/Extender: Randon Goldsmith in Treatment: 5 History of Present Illness HPI Description: ADMISSION 06/05/2020 This is a 64 year old man who has been on dialysis secondary to diabetes for about 5 years. He was recently seen by Dr. Amalia Hailey I think for routine and prophylactic nail care and foot care. He mentioned that he had in this area on the right lateral leg and he was referred here for review of this. He has been using gentamicin cream on the wound. He also uses a cam boot for Charcot ankle. He tells me that he does not have a wound on his left foot or ankle. Almost as a side he  mentioned that he had a wound on the right gluteal area. He states that this wound had been present since he was hospitalized on 2 different occasions in March. Firstly from 2/28 through 3/6 for Covid and strep bacteremia. He was rehospitalized from 3/9 through 3/12 secondary to hypotension and hypothermia. It was notable during that admission that he had a wound on the right lateral leg they did not mention the  area on the right gluteal. In any case the patient is using gentamicin on this area as well his wife changes the dressing. Past medical history; end-stage renal disease on dialysis secondary to type 2 diabetes, hypertension, hyperlipidemia Charcot ankle on the left foot and ankle, he is an ex-smoker 10 years ago remote history of hepatitis C and congestive heart failure. The patient had vascular studies on 05/01/2020 this showed ABIs on the right at 2.06 but with triphasic and biphasic waveforms his TBI was actually elevated at 1.25. He did not allow the cam boot to be removed and therefore arterial studies on the left were not done 8/10-Patient returns to clinic has continued discomfort in the right gluteal ischial area where he has a wound and less discomfort the right lateral leg wound. We have been using Santyl to these areas. He was biopsied at last visit and the results indicate inflamed granulation tissue and vasculopathy-the latter seems consistent with calciphylaxis and given the location of wound not clearly pressure etiology this may well be the case 8/17; I have reviewed the pathology report from the punch biopsies I did a few weeks ago. Calciphylaxis was not seen in these sections there was fibrinoid necrosis at the edges. They suggested clinical correlation and/or additional punch biopsies of calciphylaxis is strongly suspected. We are using Santyl to the area on the right buttock. As well as an area on the right leg which I think is a venous insufficiency ulcer, we have been using  silver alginate and this is just about healed 8/31; 2-week follow-up. I spoke to his nephrologist after his visit 2 weeks ago we both agreed that thiosulfate at least on a trial basis might be indicated. Although we did not have biopsy-proven calciphylaxis this is certainly clinically what this is. However, apparently the patient states that they have not yet started the thiosulfate at dialysis. He is dialyzing at the [facility]. We have been using Santyl to the wound surface. He has 3 wounds on the right buttock. I think 2 of these have formed since the last time he was here. These were the original satellite lesions I describe last time. the 2 new areas have a completely necrotic surface the other looks somewhat better. The induration and firmness around this area seems somewhat better today. Electronic Signature(s) Signed: 07/15/2020 5:08:08 PM By: Linton Ham MD Signed: 07/15/2020 5:08:08 PM By: Linton Ham MD Entered By: Linton Ham on 07/15/2020 16:46:20 -------------------------------------------------------------------------------- Physical Exam Details Patient Name: Date of Service: [name], [name] 07/15/2020 3:00 PM Medical Record Number: [id number] Patient Account Number: [id number] Date of Birth/Sex: Treating RN: 1956/08/26 (64 y.o. [name] Primary Care Provider: [name] Other Clinician: Referring Provider: Treating Provider/Extender: Randon Goldsmith in Treatment: 5 Constitutional Sitting or standing Blood Pressure is within target range for patient.. Pulse regular and within target range for patient.Marland Kitchen Respirations regular, non-labored and within target range.. Temperature is normal and within the target range for the patient.Marland Kitchen Appears in no distress. Notes Wound exam Right ischial gluteal. The patient has 3 areas. Is very clear that he has new areas that were the original satellite areas. These are covered with a  necrotic surface. I used a #15 scalpel to crosshatched these areas to allow for better penetration of the sample Electronic Signature(s) Signed: 07/15/2020 5:08:08 PM By: Linton Ham MD Entered By: Linton Ham on 07/15/2020 16:42:16 -------------------------------------------------------------------------------- Physician Orders Details Patient Name: Date of Service: Nicholas Sheen EL L. 07/15/2020 3:00 PM Medical  Record Number: [id number] Patient Account Number: [id number] Date of Birth/Sex: Treating RN: 09-28-1956 (64 y.o. 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 Dressing Change Frequency Wound #1 Right,Lateral Lower Leg Change dressing every day. Wound #3 Right,Distal Gluteus Change dressing every day. Wound #4 Right,Medial Gluteus Change dressing every day. Skin Barriers/Peri-Wound Care Skin Prep Wound Cleansing May shower and wash wound with soap and water. - on days that dressing is changed Primary Wound Dressing Wound #1 Right,Lateral Lower Leg Calcium Alginate with Silver Wound #3 Right,Distal Gluteus Santyl Ointment Wound #4 Right,Medial Gluteus Santyl Ointment Secondary Dressing Wound #1 Right,Lateral Lower Leg Foam Border - or ABD pad and tape Wound #3 Right,Distal Gluteus Foam Border - or ABD pad and tape Wound #4 Right,Medial Gluteus Foam Border - or ABD pad  and tape Electronic Signature(s) Signed: 07/15/2020 5:08:08 PM By: Linton Ham MD Signed: 07/16/2020 10:37:46 AM By: Carlene Coria RN Entered By: Carlene Coria on 07/15/2020 14:57:32 -------------------------------------------------------------------------------- Problem List Details Patient Name: Date of Service: Nicholas Sheen EL L. 07/15/2020 3:00 PM Medical Record Number: [id number] Patient Account Number: [id number] Date of Birth/Sex: Treating RN: 10/17/1956 (64 y.o. 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 07/15/2020 3:00 PM Medical Record Number: [id number] Patient Account Number: [id number] Date of Birth/Sex: Treating RN: February 22, 1956 (64 y.o. [name] Primary Care Provider: [name] Other Clinician: Referring Provider: Treating Provider/Extender: Randon Goldsmith in Treatment: 5 Subjective History of Present Illness (HPI) ADMISSION 06/05/2020 This is a 64 year old man who has been on dialysis secondary to  diabetes for about 5 years. He was recently seen by Dr. Amalia Hailey I think for routine and prophylactic nail care and foot care. He mentioned that he had in this area on the right lateral leg and he was referred here for review of this. He has been using gentamicin cream on the wound. He also uses a cam boot for Charcot ankle. He tells me that he does not have a wound on his left foot or ankle. Almost as a side he mentioned that he had a wound on the right gluteal area. He states that this wound had been present since he was hospitalized on 2 different occasions in March. Firstly from 2/28 through 3/6 for Covid and strep bacteremia. He was rehospitalized from 3/9 through 3/12 secondary to hypotension and  hypothermia. It was notable during that admission that he had a wound on the right lateral leg they did not mention the area on the right gluteal. In any case the patient is using gentamicin on this area as well his wife changes the dressing. Past medical history; end-stage renal disease on dialysis secondary to type 2 diabetes, hypertension, hyperlipidemia Charcot ankle on the left foot and ankle, he is an ex-smoker 10 years ago remote history of hepatitis C and congestive heart failure. The patient had vascular studies on 05/01/2020 this showed ABIs on the right at 2.06 but with triphasic and biphasic waveforms his TBI was actually elevated at 1.25. He did not allow the cam boot to be removed and therefore arterial studies on the left were not done 8/10-Patient returns to clinic has continued discomfort in the right gluteal ischial area where he has a wound and less discomfort the right lateral leg wound. We have been using Santyl to these areas. He was biopsied at last visit and the results indicate inflamed granulation tissue and vasculopathy-the latter seems consistent with calciphylaxis and given the location of wound not clearly pressure etiology this may well be the case 8/17; I have reviewed the  pathology report from the punch biopsies I did a few weeks ago. Calciphylaxis was not seen in these sections there was fibrinoid necrosis at the edges. They suggested clinical correlation and/or additional punch biopsies of calciphylaxis is strongly suspected. We are using Santyl to the area on the right buttock. As well as an area on the right leg which I think is a venous insufficiency ulcer, we have been using silver alginate and this is just about healed 8/31; 2-week follow-up. I spoke to his nephrologist after his visit 2 weeks ago we both agreed that thiosulfate at least on a trial basis might be indicated. Although we did not have biopsy-proven calciphylaxis this is certainly clinically what this is. However, apparently the patient states that they have not yet started the thiosulfate at dialysis. He is dialyzing at the [facility]. We have been using Santyl to the wound surface. He has 3 wounds on the right buttock. I think 2 of these have formed since the last time he was here. These were the original satellite lesions I describe last time. the 2 new areas have a completely necrotic surface the other looks somewhat better. The induration and firmness around this area seems somewhat better today. Objective Constitutional Sitting or standing Blood Pressure is within target range for patient.. Pulse regular and within target range for patient.Marland Kitchen Respirations regular, non-labored and within target range.. Temperature is normal and within the target range for the patient.Marland Kitchen Appears in no distress. Vitals Time Taken: 3:22 PM, Height: 73 in, Weight: 250 lbs, BMI: 33, Temperature: 98.4 F, Pulse: 97 bpm, Respiratory Rate: 19 breaths/min, Blood Pressure: 121/77 mmHg. General Notes: Wound exam ooRight ischial gluteal. The patient has 3 areas. Is very clear that he has new areas that were the original satellite areas. These are covered with a necrotic surface. I used a #15 scalpel to crosshatched  these areas to allow for better penetration of the sample Integumentary (Hair, Skin) Wound #1 status is Healed - Epithelialized. Original cause of wound was Gradually Appeared. The wound is located on the Right,Lateral Lower Leg. The wound measures 0cm length x 0cm width x 0cm depth; 0cm^2 area and 0cm^3 volume. There is no tunneling or undermining noted. There is a none present amount of drainage noted. The wound margin is  distinct with the outline attached to the wound base. There is no granulation within the wound bed. There is no necrotic tissue within the wound bed. Wound #3 status is Open. Original cause of wound was Gradually Appeared. The wound is located on the Right,Distal Gluteus. The wound measures 7.3cm length x 3.5cm width x 2cm depth; 20.067cm^2 area and 40.134cm^3 volume. There is Fat Layer (Subcutaneous Tissue) exposed. There is no tunneling or undermining noted. There is a medium amount of purulent drainage noted. The wound margin is well defined and not attached to the wound base. There is small (1-33%) pink granulation within the wound bed. There is a large (67-100%) amount of necrotic tissue within the wound bed including Eschar and Adherent Slough. Wound #4 status is Open. Original cause of wound was Gradually Appeared. The wound is located on the Right,Medial Gluteus. The wound measures 1.4cm length x 2.5cm width x 0.3cm depth; 2.749cm^2 area and 0.825cm^3 volume. There is Fat Layer (Subcutaneous Tissue) exposed. There is no tunneling or undermining noted. There is a medium amount of purulent drainage noted. The wound margin is well defined and not attached to the wound base. There is small (1-33%) pink granulation within the wound bed. There is a large (67-100%) amount of necrotic tissue within the wound bed including Eschar and Adherent Slough. 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 Dressing Change Frequency: Wound #1 Right,Lateral Lower Leg: Change dressing every day. Wound #3 Right,Distal Gluteus: Change dressing every day. Wound #4 Right,Medial Gluteus: Change dressing every day. Skin Barriers/Peri-Wound Care: Skin Prep Wound Cleansing: May shower and wash wound with soap and water. - on days that dressing is changed Primary Wound Dressing: Wound #1 Right,Lateral Lower Leg: Calcium Alginate with Silver Wound #3 Right,Distal Gluteus: Santyl Ointment Wound #4 Right,Medial Gluteus: Santyl Ointment Secondary Dressing: Wound #1 Right,Lateral Lower Leg: Foam Border - or ABD pad and tape Wound #3 Right,Distal Gluteus: Foam Border - or ABD pad and tape Wound #4 Right,Medial Gluteus: Foam Border - or ABD pad and tape 1. I am continuing Santyl to all wound areas 2 the new areas are completely eschar covered I crosshatched these to allow better penetration of the Santyl. 3. Apparently after 2 weeks the thiosulfate is still not available through the [place] in [place] I do not know that I can do anything else to help peer. 4. Most optimistically today I think the firmness and induration around this wound area is better. He has now 3 areas that are open to are covered in eschar. The original area I biopsied has still not epithelialized however Electronic Signature(s) Signed: 07/15/2020 5:08:08 PM By: Linton Ham MD Entered By: Linton Ham on 07/15/2020 16:47:50 -------------------------------------------------------------------------------- SuperBill Details Patient Name: Date of Service: [name] 07/15/2020 Medical Record Number: [id number] Patient Account  Number: [id number] Date of Birth/Sex: Treating RN: 1956-05-09 (64 y.o. [name] Primary Care Provider: [name] Other Clinician: Referring Provider: Treating Provider/Extender: Randon Goldsmith in Treatment: 5 Diagnosis Coding ICD-10 Codes Code Description [phone] Chronic venous hypertension (idiopathic) with ulcer and inflammation of right lower extremity L97.212 Non-pressure chronic ulcer of right calf with fat layer exposed L89.310 Pressure ulcer of right buttock, unstageable E11.22 Type 2  diabetes mellitus with diabetic chronic kidney disease L95.9 Vasculitis limited to the skin, unspecified Facility Procedures CPT4 Code: [id number] Description: [phone] - DEBRIDE W/O ANES NON SELECT Modifier: Quantity: 1 Physician Procedures : CPT4 Code Description Modifier [id number] - WC PHYS LEVEL 3 - EST PT ICD-10 Diagnosis Description L89.310 Pressure ulcer of right buttock, unstageable L95.9 Vasculitis limited to the skin, unspecified Quantity: 1 Electronic Signature(s) Signed: 07/15/2020 5:08:08 PM By: Linton Ham MD Signed: 07/16/2020 10:37:46 AM By: Carlene Coria RN Entered By: Carlene Coria on 07/15/2020 16:53:56

## 2020-07-16 NOTE — Progress Notes (Addendum)
BENEDETTO, RYDER ([id number]) Visit Report for 07/15/2020 Arrival Information Details Patient Name: Date of Service: [name], [name] 07/15/2020 3:00 PM Medical Record Number: [id number] Patient Account Number: [id number] Date of Birth/Sex: Treating RN: Apr 10, 1956 (64 y.o. [name]) Carlene Coria Primary Care Kailani Brass: [name] Other Clinician: Referring Shaneika Rossa: Treating Yadhira Mckneely/Extender: Randon Goldsmith in Treatment: 5 Visit Information History Since Last Visit Added or deleted any medications: No Patient Arrived: Ambulatory Any new allergies or adverse reactions: No Arrival Time: 15:22 Had a fall or experienced change in No Accompanied By: self activities of daily living that may affect Transfer Assistance: None risk of falls: Patient Identification Verified: Yes Signs or symptoms of abuse/neglect since last visito No Secondary Verification Process Completed: Yes Hospitalized since last visit: No Patient Requires Transmission-Based Precautions: No Implantable device outside of the clinic excluding No Patient Has Alerts: Yes cellular tissue based products placed in the center Patient Alerts: Right ABI/TBI: 1.35 since last visit: Has Dressing in Place as Prescribed: Yes Pain Present Now: Yes Electronic Signature(s) Signed: 07/16/2020 11:00:36 AM By: Sandre Kitty Entered By: Sandre Kitty on 07/15/2020 15:22:30 -------------------------------------------------------------------------------- Encounter Discharge Information Details Patient Name: Date of Service: Nicholas Sheen EL L. 07/15/2020 3:00 PM Medical Record Number: [id number] Patient Account Number: [id number] Date of Birth/Sex: Treating RN: [birth year] (64 y.o. [name] Primary Care Ynez Eugenio: [name] Other Clinician: Referring Praneel Haisley: Treating Chrissa Meetze/Extender: Randon Goldsmith in Treatment: 5 Encounter Discharge Information Items Post Procedure  Vitals Discharge Condition: Stable Temperature (F): 98.4 Ambulatory Status: Ambulatory Pulse (bpm): 97 Discharge Destination: Home Respiratory Rate (breaths/min): 19 Transportation: Private Auto Blood Pressure (mmHg): 121/77 Accompanied By: wife Schedule Follow-up Appointment: Yes Clinical Summary of Care: Patient Declined Electronic Signature(s) Signed: 07/15/2020 5:00:49 PM By: Kela Millin Entered By: Kela Millin on 07/15/2020 16:56:54 -------------------------------------------------------------------------------- Lower Extremity Assessment Details Patient Name: Date of Service: Nicholas Sheen EL L. 07/15/2020 3:00 PM Medical Record Number: [id number] Patient Account Number: [id number] Date of Birth/Sex: Treating RN: 1956/09/29 (64 y.o. [name] Primary Care Shamond Skelton: [name] Other Clinician: Referring Misao Fackrell: Treating Tandy Lewin/Extender: Randon Goldsmith in Treatment: 5 Electronic Signature(s) Signed: 07/15/2020 5:00:49 PM By: Kela Millin Entered By: Kela Millin on 07/15/2020 15:38:25 -------------------------------------------------------------------------------- Multi Wound Chart Details Patient Name: Date of Service: Nicholas Sheen EL L. 07/15/2020 3:00 PM Medical Record Number: [id number] Patient Account Number: [id number] Date of Birth/Sex: Treating RN: 03-24-1956 (64 y.o. [name]) Carlene Coria Primary Care Neenah Canter: [name] Other Clinician: Referring Attikus Bartoszek: Treating Advait Buice/Extender: Randon Goldsmith in Treatment: 5 Vital Signs Height(in): 23 Pulse(bpm): 78 Weight(lbs): 250 Blood Pressure(mmHg): 121/77 Body Mass Index(BMI): 33 Temperature(F): 98.4 Respiratory Rate(breaths/min): 19 Photos: [1:No Photos Right, Lateral Lower Leg] [3:No Photos Right, Distal Gluteus] [4:No Photos Right, Medial Gluteus] Wound Location: [1:Gradually Appeared] [3:Gradually Appeared] [4:Gradually  Appeared] Wounding Event: [1:Venous Leg Ulcer] [3:Pressure Ulcer] [4:Atypical] Primary Etiology: [1:Cataracts, Sleep Apnea, Congestive] [3:Cataracts, Sleep Apnea, Congestive] [4:Cataracts, Sleep Apnea, Congestive] Comorbid History: [1:Heart Failure, Hypertension, Hepatitis C, Type II Diabetes, End Stage Renal Disease, Neuropathy 01/23/2020] [3:Heart Failure, Hypertension, Hepatitis C, Type II Diabetes, End Stage Renal C, Type II Diabetes, End Stage Renal Disease,  Neuropathy 03/15/2020] [4:Disease, Neuropathy 06/24/2020] Date Acquired: [1:5] [3:5] [4:3] Weeks of Treatment: [1:Healed - Epithelialized] [3:Open] [4:Open] Wound Status: [1:No] [3:No] [4:Yes] Clustered Wound: [1:N/A] [3:N/A] [4:5] Clustered Quantity: [1:0x0x0] [3:7.3x3.5x2] [4:1.4x2.5x0.3] Measurements L x W x D (cm) [1:0] [3:20.067] [4:2.749] A (cm) : rea [1:0] [3:40.134] [4:0.825] Volume (cm) : [1:100.00%] [3:13.60%] [4:81.80%] % Reduction in  Area: [1:100.00%] [3:17.70%] [4:45.40%] % Reduction in Volume: [1:Full Thickness Without Exposed] [3:Unstageable/Unclassified] [4:Full Thickness Without Exposed] Classification: [1:Support Structures None Present] [3:Medium] [4:Support Structures Medium] Exudate A mount: [1:N/A] [3:Purulent] [4:Purulent] Exudate Type: [1:N/A] [3:yellow, brown, green] [4:yellow, brown, green] Exudate Color: [1:Distinct, outline attached] [3:Well defined, not attached] [4:Well defined, not attached] Wound Margin: [1:None Present (0%)] [3:Small (1-33%)] [4:Small (1-33%)] Granulation Amount: [1:N/A] [3:Pink] [4:Pink] Granulation Quality: [1:None Present (0%)] [3:Large (67-100%)] [4:Large (67-100%)] Necrotic Amount: [1:N/A] [3:Eschar, Adherent Slough] [4:Eschar, Adherent Slough] Necrotic Tissue: [1:Fascia: No] [3:Fat Layer (Subcutaneous Tissue): Yes Fat Layer (Subcutaneous Tissue): Yes] Exposed Structures: [1:Fat Layer (Subcutaneous Tissue): No Tendon: No Muscle: No Joint: No Bone: No Large (67-100%)]  [3:Fascia: No Tendon: No Muscle: No Joint: No Bone: No Small (1-33%)] [4:Fascia: No Tendon: No Muscle: No Joint: No Bone: No Medium (34-66%)] Treatment Notes Electronic Signature(s) Signed: 07/15/2020 5:08:08 PM By: Linton Ham MD Signed: 07/16/2020 10:37:46 AM By: Carlene Coria RN Entered By: Linton Ham on 07/15/2020 16:38:45 -------------------------------------------------------------------------------- Multi-Disciplinary Care Plan Details Patient Name: Date of Service: Nicholas Sheen EL L. 07/15/2020 3:00 PM Medical Record Number: [id number] Patient Account Number: [id number] Date of Birth/Sex: Treating RN: 1956/07/27 (64 y.o. [name] Primary Care Wyett Narine: [name] Other Clinician: Referring Edie Vallandingham: Treating Tayquan Gassman/Extender: Randon Goldsmith in Treatment: 5 Active Inactive Wound/Skin Impairment Nursing Diagnoses: Impaired tissue integrity Knowledge deficit related to ulceration/compromised skin integrity Goals: Patient/caregiver will verbalize understanding of skin care regimen Date Initiated: 06/05/2020 Target Resolution Date: 08/04/2020 Goal Status: Active Interventions: Assess patient/caregiver ability to obtain necessary supplies Assess patient/caregiver ability to perform ulcer/skin care regimen upon admission and as needed Assess ulceration(s) every visit Provide education on ulcer and skin care Notes: Electronic Signature(s) Signed: 07/16/2020 10:37:46 AM By: Carlene Coria RN Entered By: Carlene Coria on 07/15/2020 14:57:59 -------------------------------------------------------------------------------- Pain Assessment Details Patient Name: Date of Service: [name], [name] 07/15/2020 3:00 PM Medical Record Number: [id number] Patient Account Number: [id number] Date of Birth/Sex: Treating RN: 1956/03/15 (64 y.o. [name] Primary Care Tram Wrenn: [name] Other Clinician: Referring Deretha Ertle: Treating Trissa Molina/Extender:  Randon Goldsmith in Treatment: 5 Active Problems Location of Pain Severity and Description of Pain Patient Has Paino No Site Locations Pain Management and Medication Current Pain Management: Electronic Signature(s) Signed: 07/16/2020 10:37:46 AM By: Carlene Coria RN Signed: 07/16/2020 11:00:36 AM By: Sandre Kitty Entered By: Sandre Kitty on 07/15/2020 15:23:13 -------------------------------------------------------------------------------- Patient/Caregiver Education Details Patient Name: Date of Service: [name] 8/31/2021andnbsp3:00 PM Medical Record Number: [id number] Patient Account Number: [id number] Date of Birth/Gender: Treating RN: 1956-02-06 (64 y.o. [name] Primary Care Physician: [name] Other Clinician: Referring Physician: Treating Physician/Extender: Randon Goldsmith in Treatment: 5 Education Assessment Education Provided To: Patient Education Topics Provided Wound/Skin Impairment: Methods: Explain/Verbal Responses: State content correctly Electronic Signature(s) Signed: 07/16/2020 10:37:46 AM By: Carlene Coria RN Entered By: Carlene Coria on 07/15/2020 14:58:16 -------------------------------------------------------------------------------- Wound Assessment Details Patient Name: Date of Service: [name], [name] 07/15/2020 3:00 PM Medical Record Number: [id number] Patient Account Number: [id number] Date of Birth/Sex: Treating RN: 11/07/1956 (64 y.o. [name]) Carlene Coria Primary Care Adaliah Hiegel: Other Clinician: [name] Referring Karman Veney: Treating Elihue Ebert/Extender: Randon Goldsmith in Treatment: 5 Wound Status Wound Number: 1 Primary Venous Leg Ulcer Etiology: Wound Location: Right, Lateral Lower Leg Wound Healed - Epithelialized Wounding Event: Gradually Appeared Status: Date Acquired: 01/23/2020 Comorbid Cataracts, Sleep Apnea, Congestive Heart Failure,  Hypertension, Weeks Of Treatment: 5 History: Hepatitis C, Type II Diabetes, End  Stage Renal Disease, Clustered Wound: No Neuropathy Photos Photo Uploaded By: Mikeal Hawthorne on 07/16/2020 13:46:31 Wound Measurements Length: (cm) Width: (cm) Depth: (cm) Area: (cm) Volume: (cm) 0 % Reduction in Area: 100% 0 % Reduction in Volume: 100% 0 Epithelialization: Large (67-100%) 0 Tunneling: No 0 Undermining: No Wound Description Classification: Full Thickness Without Exposed Support Structures Wound Margin: Distinct, outline attached Exudate Amount: None Present Foul Odor After Cleansing: No Slough/Fibrino No Wound Bed Granulation Amount: None Present (0%) Exposed Structure Necrotic Amount: None Present (0%) Fascia Exposed: No Fat Layer (Subcutaneous Tissue) Exposed: No Tendon Exposed: No Muscle Exposed: No Joint Exposed: No Bone Exposed: No Electronic Signature(s) Signed: 07/15/2020 5:00:49 PM By: Kela Millin Signed: 07/16/2020 10:37:46 AM By: Carlene Coria RN Entered By: Kela Millin on 07/15/2020 15:39:01 -------------------------------------------------------------------------------- Wound Assessment Details Patient Name: Date of Service: Nicholas Sheen EL L. 07/15/2020 3:00 PM Medical Record Number: [id number] Patient Account Number: [id number] Date of Birth/Sex: Treating RN: 25-Sep-1956 (64 y.o. [name]) Carlene Coria Primary Care Nubia Ziesmer: [name] Other Clinician: Referring Arieliz Latino: Treating Samanth Mirkin/Extender: Randon Goldsmith in Treatment: 5 Wound Status Wound Number: 3 Primary Pressure Ulcer Etiology: Wound Location: Right, Distal Gluteus Wound Open Wounding Event: Gradually Appeared Status: Date Acquired: 03/15/2020 Comorbid Cataracts, Sleep Apnea, Congestive Heart Failure, Hypertension, Weeks Of Treatment: 5 History: Hepatitis C, Type II Diabetes, End Stage Renal Disease, Clustered Wound: No Neuropathy Photos Wound  Measurements Length: (cm) 7.3 Width: (cm) 3.5 Depth: (cm) 2 Area: (cm) 20.067 Volume: (cm) 40.134 % Reduction in Area: 13.6% % Reduction in Volume: 17.7% Epithelialization: Small (1-33%) Tunneling: No Undermining: No Wound Description Classification: Unstageable/Unclassified Wound Margin: Well defined, not attached Exudate Amount: Medium Exudate Type: Purulent Exudate Color: yellow, brown, green Foul Odor After Cleansing: No Slough/Fibrino Yes Wound Bed Granulation Amount: Small (1-33%) Exposed Structure Granulation Quality: Pink Fascia Exposed: No Necrotic Amount: Large (67-100%) Fat Layer (Subcutaneous Tissue) Exposed: Yes Necrotic Quality: Eschar, Adherent Slough Tendon Exposed: No Muscle Exposed: No Joint Exposed: No Bone Exposed: No Treatment Notes Wound #3 (Right, Distal Gluteus) 1. Cleanse With Wound Cleanser 2. Periwound Care Skin Prep 3. Primary Dressing Applied Santyl 4. Secondary Dressing Foam Border Dressing Electronic Signature(s) Signed: 07/16/2020 4:12:22 PM By: Mikeal Hawthorne EMT/HBOT/SD Signed: 07/16/2020 5:45:10 PM By: Carlene Coria RN Previous Signature: 07/15/2020 5:00:49 PM Version By: Kela Millin Previous Signature: 07/16/2020 10:37:46 AM Version By: Carlene Coria RN Entered By: Mikeal Hawthorne on 07/16/2020 13:47:36 -------------------------------------------------------------------------------- Wound Assessment Details Patient Name: Date of Service: Nicholas Sheen EL L. 07/15/2020 3:00 PM Medical Record Number: [id number] Patient Account Number: [id number] Date of Birth/Sex: Treating RN: Dec 28, 1955 (64 y.o. [name] Primary Care Beacher Every: [name] Other Clinician: Referring Hedy Garro: Treating Darci Lykins/Extender: Randon Goldsmith in Treatment: 5 Wound Status Wound Number: 4 Primary Atypical Etiology: Wound Location: Right, Medial Gluteus Wound Open Wounding Event: Gradually Appeared Status: Date Acquired:  06/24/2020 Comorbid Cataracts, Sleep Apnea, Congestive Heart Failure, Hypertension, Weeks Of Treatment: 3 History: Hepatitis C, Type II Diabetes, End Stage Renal Disease, Clustered Wound: Yes Neuropathy Photos Photo Uploaded By: Mikeal Hawthorne on 07/16/2020 13:47:00 Wound Measurements Length: (cm) 1.4 Width: (cm) 2.5 Depth: (cm) 0.3 Clustered Quantity: 5 Area: (cm) 2. Volume: (cm) 0. % Reduction in Area: 81.8% % Reduction in Volume: 45.4% Epithelialization: Medium (34-66%) Tunneling: No 749 Undermining: No 825 Wound Description Classification: Full Thickness Without Exposed Support Structures Wound Margin: Well defined, not attached Exudate Amount: Medium Exudate Type: Purulent Exudate Color: yellow, brown, green Foul Odor After Cleansing: No Slough/Fibrino Yes  Wound Bed Granulation Amount: Small (1-33%) Exposed Structure Granulation Quality: Pink Fascia Exposed: No Necrotic Amount: Large (67-100%) Fat Layer (Subcutaneous Tissue) Exposed: Yes Necrotic Quality: Eschar, Adherent Slough Tendon Exposed: No Muscle Exposed: No Joint Exposed: No Bone Exposed: No Treatment Notes Wound #4 (Right, Medial Gluteus) 1. Cleanse With Wound Cleanser 2. Periwound Care Skin Prep 3. Primary Dressing Applied Santyl 4. Secondary Dressing Foam Border Dressing Electronic Signature(s) Signed: 07/15/2020 5:00:49 PM By: Kela Millin Signed: 07/16/2020 10:37:46 AM By: Carlene Coria RN Entered By: Kela Millin on 07/15/2020 15:40:34 -------------------------------------------------------------------------------- Vitals Details Patient Name: Date of Service: Nicholas Sheen EL L. 07/15/2020 3:00 PM Medical Record Number: [id number] Patient Account Number: [id number] Date of Birth/Sex: Treating RN: [birth year] (64 y.o. [name]) Carlene Coria Primary Care Anecia Nusbaum: [name] Other Clinician: Referring Adren Dollins: Treating Lashaye Fisk/Extender: Randon Goldsmith in  Treatment: 5 Vital Signs Time Taken: 15:22 Temperature (F): 98.4 Height (in): 73 Pulse (bpm): 97 Weight (lbs): 250 Respiratory Rate (breaths/min): 19 Body Mass Index (BMI): 33 Blood Pressure (mmHg): 121/77 Reference Range: 80 - 120 mg / dl Electronic Signature(s) Signed: 07/16/2020 11:00:36 AM By: Sandre Kitty Entered By: Sandre Kitty on 07/15/2020 15:23:07

## 2020-07-29 ENCOUNTER — Encounter (HOSPITAL_BASED_OUTPATIENT_CLINIC_OR_DEPARTMENT_OTHER): Payer: Medicare (Managed Care) | Attending: Internal Medicine | Admitting: Internal Medicine

## 2020-07-29 DIAGNOSIS — L89310 Pressure ulcer of right buttock, unstageable: Secondary | ICD-10-CM | POA: Insufficient documentation

## 2020-07-29 DIAGNOSIS — Z992 Dependence on renal dialysis: Secondary | ICD-10-CM | POA: Insufficient documentation

## 2020-07-29 DIAGNOSIS — L97212 Non-pressure chronic ulcer of right calf with fat layer exposed: Secondary | ICD-10-CM | POA: Insufficient documentation

## 2020-07-29 DIAGNOSIS — I132 Hypertensive heart and chronic kidney disease with heart failure and with stage 5 chronic kidney disease, or end stage renal disease: Secondary | ICD-10-CM | POA: Diagnosis not present

## 2020-07-29 DIAGNOSIS — Z87891 Personal history of nicotine dependence: Secondary | ICD-10-CM | POA: Insufficient documentation

## 2020-07-29 DIAGNOSIS — E785 Hyperlipidemia, unspecified: Secondary | ICD-10-CM | POA: Diagnosis not present

## 2020-07-29 DIAGNOSIS — E1122 Type 2 diabetes mellitus with diabetic chronic kidney disease: Secondary | ICD-10-CM | POA: Insufficient documentation

## 2020-07-29 DIAGNOSIS — N186 End stage renal disease: Secondary | ICD-10-CM | POA: Diagnosis not present

## 2020-07-29 DIAGNOSIS — E1140 Type 2 diabetes mellitus with diabetic neuropathy, unspecified: Secondary | ICD-10-CM | POA: Diagnosis not present

## 2020-07-29 DIAGNOSIS — I509 Heart failure, unspecified: Secondary | ICD-10-CM | POA: Insufficient documentation

## 2020-07-29 DIAGNOSIS — E11622 Type 2 diabetes mellitus with other skin ulcer: Secondary | ICD-10-CM | POA: Insufficient documentation

## 2020-07-29 DIAGNOSIS — Z8619 Personal history of other infectious and parasitic diseases: Secondary | ICD-10-CM | POA: Insufficient documentation

## 2020-07-29 DIAGNOSIS — G4730 Sleep apnea, unspecified: Secondary | ICD-10-CM | POA: Insufficient documentation

## 2020-08-04 NOTE — Progress Notes (Signed)
[name], [name] ([id number]) Visit Report for 07/29/2020 Debridement Details Patient Name: Date of Service: Nicholas, Duran 07/29/2020 8:00 A M Medical Record Number: [id number] Patient Account Number: [id number] Date of Birth/Sex: Treating RN: [birth year] (64 y.o. [name]) Carlene Coria Primary Care Provider: [name] Other Clinician: Referring Provider: Treating Provider/Extender: Randon Goldsmith in Treatment: 7 Debridement Performed for Assessment: Wound #3 Right,Distal Gluteus Performed By: Physician Ricard Dillon., MD Debridement Type: Debridement Level of Consciousness (Pre-procedure): Awake and Alert Pre-procedure Verification/Time Out Yes - 08:35 Taken: Start Time: 08:35 Pain Control: Lidocaine 5% topical ointment T Area Debrided (L x W): otal 8 (cm) x 3.9 (cm) = 31.2 (cm) Tissue and other material debrided: Viable, Non-Viable, Slough, Subcutaneous, Skin: Dermis , Skin: Epidermis, Slough Level: Skin/Subcutaneous Tissue Debridement Description: Excisional Instrument: Blade, Forceps Bleeding: Moderate Hemostasis Achieved: Pressure End Time: 08:38 Procedural Pain: 4 Post Procedural Pain: 0 Response to Treatment: Procedure was tolerated well Level of Consciousness (Post- Awake and Alert procedure): Post Debridement Measurements of Total Wound Length: (cm) 8 Stage: Unstageable/Unclassified Width: (cm) 3.9 Depth: (cm) 2.7 Volume: (cm) 66.162 Character of Wound/Ulcer Post Debridement: Improved Post Procedure Diagnosis Same as Pre-procedure Electronic Signature(s) Signed: 07/29/2020 5:12:45 PM By: Linton Ham MD Signed: 08/04/2020 1:15:48 PM By: Carlene Coria RN Entered By: Linton Ham on 07/29/2020 08:48:37 -------------------------------------------------------------------------------- HPI Details Patient Name: Date of Service: Nicholas Sheen EL L. 07/29/2020 8:00 A M Medical Record Number: [id number] Patient Account Number:  [id number] Date of Birth/Sex: Treating RN: March 10, 1956 (64 y.o. [name] Primary Care Provider: [name] Other Clinician: Referring Provider: Treating Provider/Extender: Randon Goldsmith in Treatment: 7 History of Present Illness HPI Description: ADMISSION 06/05/2020 This is a 64 year old man who has been on dialysis secondary to diabetes for about 5 years. He was recently seen by Dr. Amalia Hailey I think for routine and prophylactic nail care and foot care. He mentioned that he had in this area on the right lateral leg and he was referred here for review of this. He has been using gentamicin cream on the wound. He also uses a cam boot for Charcot ankle. He tells me that he does not have a wound on his left foot or ankle. Almost as a side he mentioned that he had a wound on the right gluteal area. He states that this wound had been present since he was hospitalized on 2 different occasions in March. Firstly from 2/28 through 3/6 for Covid and strep bacteremia. He was rehospitalized from 3/9 through 3/12 secondary to hypotension and hypothermia. It was notable during that admission that he had a wound on the right lateral leg they did not mention the area on the right gluteal. In any case the patient is using gentamicin on this area as well his wife changes the dressing. Past medical history; end-stage renal disease on dialysis secondary to type 2 diabetes, hypertension, hyperlipidemia Charcot ankle on the left foot and ankle, he is an ex-smoker 10 years ago remote history of hepatitis C and congestive heart failure. The patient had vascular studies on 05/01/2020 this showed ABIs on the right at 2.06 but with triphasic and biphasic waveforms his TBI was actually elevated at 1.25. He did not allow the cam boot to be removed and therefore arterial studies on the left were not done 8/10-Patient returns to clinic has continued discomfort in the right gluteal ischial area where he  has a wound and less discomfort the right lateral leg wound. We have been using  Santyl to these areas. He was biopsied at last visit and the results indicate inflamed granulation tissue and vasculopathy-the latter seems consistent with calciphylaxis and given the location of wound not clearly pressure etiology this may well be the case 8/17; I have reviewed the pathology report from the punch biopsies I did a few weeks ago. Calciphylaxis was not seen in these sections there was fibrinoid necrosis at the edges. They suggested clinical correlation and/or additional punch biopsies of calciphylaxis is strongly suspected. We are using Santyl to the area on the right buttock. As well as an area on the right leg which I think is a venous insufficiency ulcer, we have been using silver alginate and this is just about healed 8/31; 2-week follow-up. I spoke to his nephrologist after his visit 2 weeks ago we both agreed that thiosulfate at least on a trial basis might be indicated. Although we did not have biopsy-proven calciphylaxis this is certainly clinically what this is. However, apparently the patient states that they have not yet started the thiosulfate at dialysis. He is dialyzing at the [facility] We have been using Santyl to the wound surface. He has 3 wounds on the right buttock. I think 2 of these have formed since the last time he was here. These were the original satellite lesions I describe last time. the 2 new areas have a completely necrotic surface the other looks somewhat better. The induration and firmness around this area seems somewhat better today. 9/14; 2-week follow-up. He has 3 wounds on the right buttock to the original wound and 2 satellite late lesions. The satellite lesions expanded but have a clean base the original wound is necrotic. He just started on the thiosulfate within the last 2 dialysis sessions. Apparently his nephrologist wants to speak to me about how long to run  this. The other worrisome thing here is skin breakdown around the original wound. This looks somewhat angry with a small area loss of epithelialization. Almost looks like a contact dermatitis. Electronic Signature(s) Signed: 07/29/2020 5:12:45 PM By: Linton Ham MD Entered By: Linton Ham on 07/29/2020 08:49:45 -------------------------------------------------------------------------------- Physical Exam Details Patient Name: Date of Service: Nicholas Sheen EL L. 07/29/2020 8:00 A M Medical Record Number: [id number] Patient Account Number: [id number] Date of Birth/Sex: Treating RN: [birth year] (64 y.o. [name] Primary Care Provider: [name] Other Clinician: Referring Provider: Treating Provider/Extender: Randon Goldsmith in Treatment: 7 Notes Wound exam; right ischial/gluteal. This is really an extensive wounded area. He has his original wound which has a necrotic surface as this is where I crosshatched last week. He has 2 satellite areas that are superficial but larger. Also concerning and almost rectangular distribution around his large wound somewhat inflamed with some loss of small epithelial areas this almost looks like a contact dermatitis. Surface necrotic tissue on his original wound debrided with pickups and a #10 scalpel I removed necrotic subcutaneous tissue the base of this has some depth but generally an acceptable base. It is likely that this will need further debridement Electronic Signature(s) Signed: 07/29/2020 5:12:45 PM By: Linton Ham MD Entered By: Linton Ham on 07/29/2020 08:51:30 -------------------------------------------------------------------------------- Physician Orders Details Patient Name: Date of Service: Nicholas Sheen EL L. 07/29/2020 8:00 A M Medical Record Number: [id number] Patient Account Number: [id number] Date of Birth/Sex: Treating RN: [birth year] (64 y.o. 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 Dressing Change Frequency Wound #3 Right,Distal Gluteus Change dressing every day. Wound #4 Right,Medial Gluteus Change dressing every day. Skin Barriers/Peri-Wound Care Skin Prep Wound Cleansing May shower and wash wound with soap and water. - on days that dressing is changed Primary Wound Dressing Wound #3 Right,Distal Gluteus Santyl Ointment Wound #4 Right,Medial Gluteus Santyl Ointment Secondary Dressing Wound #3 Right,Distal Gluteus ABD pad Wound #4 Right,Medial Gluteus Foam Border - or ABD pad and tape Patient Medications llergies: penicillin, propolis (bee glue), Vioxx A Notifications Medication Indication Start End 07/29/2020 Santyl DOSE topical 250 unit/gram ointment - ointment topical to wounds change daily Electronic Signature(s) Signed: 07/29/2020 8:53:22 AM By: Linton Ham MD Entered By: Linton Ham on 07/29/2020 08:53:21 -------------------------------------------------------------------------------- Problem List Details Patient Name: Date of Service: Nicholas Sheen EL L. 07/29/2020 8:00 A M Medical Record Number: [id number] Patient Account Number: [id number] Date of Birth/Sex: Treating RN: 1956-09-03 (64 y.o. [name] Primary Care Provider: [name] Other Clinician: Referring Provider: Treating Provider/Extender: Randon Goldsmith  in Treatment: 7 Active Problems ICD-10 Encounter Code Description Active Date MDM Diagnosis I87.331 Chronic venous hypertension (idiopathic) with ulcer and inflammation of right 06/05/2020 No Yes lower extremity L97.212 Non-pressure chronic ulcer of right calf with fat layer exposed 06/05/2020 No Yes L89.310 Pressure ulcer of right buttock, unstageable 06/05/2020 No Yes E11.22 Type 2 diabetes mellitus with diabetic chronic kidney disease 06/05/2020 No Yes L95.9 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 07/29/2020 8:00 A M Medical Record Number: [id number] Patient Account Number: [id number] Date of Birth/Sex: Treating RN: 1956/05/13 (64 y.o. [name] Primary Care Provider: [name] Other Clinician: Referring Provider: Treating Provider/Extender: Randon Goldsmith in Treatment: 7 Subjective History of Present Illness (HPI) ADMISSION 06/05/2020 This is a 64 year old man who has been on dialysis secondary to diabetes for about 5 years. He was recently seen by Dr. Amalia Hailey I think for routine and prophylactic nail care and foot care. He mentioned that he had in this area on the right lateral leg and he was referred here for review of this. He has been using gentamicin cream on the wound. He also uses a cam boot for Charcot ankle. He tells me that he does not have a wound on his left foot or ankle. Almost as a side he mentioned that he had a wound on the right gluteal area. He states that this wound had been present since he was hospitalized on 2 different occasions in March. Firstly from 2/28 through 3/6 for Covid and strep bacteremia. He  was rehospitalized from 3/9 through 3/12 secondary to hypotension and hypothermia. It was notable during that admission that he had a wound on the right lateral leg they did not mention the area on the right gluteal. In any case the patient is using gentamicin on this area as well his wife changes the dressing. Past medical history; end-stage renal disease on dialysis secondary to type 2  diabetes, hypertension, hyperlipidemia Charcot ankle on the left foot and ankle, he is an ex-smoker 10 years ago remote history of hepatitis C and congestive heart failure. The patient had vascular studies on 05/01/2020 this showed ABIs on the right at 2.06 but with triphasic and biphasic waveforms his TBI was actually elevated at 1.25. He did not allow the cam boot to be removed and therefore arterial studies on the left were not done 8/10-Patient returns to clinic has continued discomfort in the right gluteal ischial area where he has a wound and less discomfort the right lateral leg wound. We have been using Santyl to these areas. He was biopsied at last visit and the results indicate inflamed granulation tissue and vasculopathy-the latter seems consistent with calciphylaxis and given the location of wound not clearly pressure etiology this may well be the case 8/17; I have reviewed the pathology report from the punch biopsies I did a few weeks ago. Calciphylaxis was not seen in these sections there was fibrinoid necrosis at the edges. They suggested clinical correlation and/or additional punch biopsies of calciphylaxis is strongly suspected. We are using Santyl to the area on the right buttock. As well as an area on the right leg which I think is a venous insufficiency ulcer, we have been using silver alginate and this is just about healed 8/31; 2-week follow-up. I spoke to his nephrologist after his visit 2 weeks ago we both agreed that thiosulfate at least on a trial basis might be indicated. Although we did not  have biopsy-proven calciphylaxis this is certainly clinically what this is. However, apparently the patient states that they have not yet started the thiosulfate at dialysis. He is dialyzing at the [facility]. We have been using Santyl to the wound surface. He has 3 wounds on the right buttock. I think 2 of these have formed since the last time he was here. These were the original satellite lesions I describe last time. the 2 new areas have a completely necrotic surface the other looks somewhat better. The induration and firmness around this area seems somewhat better today. 9/14; 2-week follow-up. He has 3 wounds on the right buttock to the original wound and 2 satellite late lesions. The satellite lesions expanded but have a clean base the original wound is necrotic. He just started on the thiosulfate within the last 2 dialysis sessions. Apparently his nephrologist wants to speak to me about how long to run this. The other worrisome thing here is skin breakdown around the original wound. This looks somewhat angry with a small area loss of epithelialization. Almost looks like a contact dermatitis. Objective Constitutional Vitals Time Taken: 8:09 AM, Height: 73 in, Weight: 250 lbs, BMI: 33, Temperature: 98.3 F, Pulse: 95 bpm, Respiratory Rate: 19 breaths/min, Blood Pressure: 121/77 mmHg. Integumentary (Hair, Skin) Wound #3 status is Open. Original cause of wound was Gradually Appeared. The wound is located on the Right,Distal Gluteus. The wound measures 8cm length x 3.9cm width x 2.7cm depth; 24.504cm^2 area and 66.162cm^3 volume. There is Fat Layer (Subcutaneous Tissue) exposed. There is no tunneling or undermining noted. There is a medium amount of purulent drainage noted. The wound margin is well defined and not attached to the wound base. There is small (1-33%) pink granulation within the wound bed. There is a large (67-100%) amount of necrotic tissue within the wound bed including  Eschar and Adherent Slough. Wound #4 status is Open. Original cause of wound was Gradually Appeared. The wound is located on the  Right,Medial Gluteus. The wound measures 1cm length x 2.6cm width x 0.5cm depth; 2.042cm^2 area and 1.021cm^3 volume. There is Fat Layer (Subcutaneous Tissue) exposed. There is no tunneling or undermining noted. There is a medium amount of purulent drainage noted. The wound margin is well defined and not attached to the wound base. There is small (1-33%) pink granulation within the wound bed. There is a large (67-100%) amount of necrotic tissue within the wound bed including Adherent Slough. 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 There was a Excisional Skin/Subcutaneous Tissue Debridement with a total area of 31.2 sq cm performed by Ricard Dillon., MD. With the following instrument(s): Blade, and Forceps to remove Viable and Non-Viable tissue/material. Material removed includes Subcutaneous Tissue, Slough, Skin: Dermis, and Skin: Epidermis after achieving pain control using Lidocaine 5% topical ointment. No specimens were taken. A time out was conducted at 08:35, prior to the start of the procedure. A Moderate amount of bleeding was controlled with Pressure. The procedure was tolerated well with a pain level of 4 throughout and a pain level of 0 following the procedure. Post Debridement Measurements: 8cm length x 3.9cm width x 2.7cm depth; 66.162cm^3 volume. Post debridement Stage noted as Unstageable/Unclassified. Character of Wound/Ulcer Post  Debridement is improved. Post procedure Diagnosis Wound #3: Same as Pre-Procedure Plan Follow-up Appointments: Return Appointment in 2 weeks. Dressing Change Frequency: Wound #3 Right,Distal Gluteus: Change dressing every day. Wound #4 Right,Medial Gluteus: Change dressing every day. Skin Barriers/Peri-Wound Care: Skin Prep Wound Cleansing: May shower and wash wound with soap and water. - on days that dressing is changed Primary Wound Dressing: Wound #3 Right,Distal Gluteus: Santyl Ointment Wound #4 Right,Medial Gluteus: Santyl Ointment Secondary Dressing: Wound #3 Right,Distal Gluteus: ABD pad Wound #4 Right,Medial Gluteus: Foam Border - or ABD pad and tape The following medication(s) was prescribed: Santyl topical 250 unit/gram ointment ointment topical to wounds change daily starting 07/29/2020 1. Continue Santyl for another 2 weeks. 2. His original wound required a reasonably extensive debridement further mechanical debridement of this wound is likely to be necessary. The other 2 areas appears superficial 3. Somewhat concerning rectangular area around the original wound with some inflammation and some loss of superficial epithelium in some areas. I think this is probably a contact dermatitis of the foam he is using therefore we are going to change to an ABD pad. It is also possible this reflects the calciphylaxis if so I think we are going to be in trouble in terms of larger wound area. 4. His nephrologist wanted to talk to me about the duration of the thiosulfate. I do not think I have ever been asked this question but I think it probably should be in the area of 6 to 8 weeks work until we can determine whether his wounds are responding. 5. I am aware about the controversy of debriding wounds with calciphylaxis however in my experience with this I have always found that managing these wounds the same as any other wound in terms of debridement is Audiological scientist) Signed: 07/29/2020 5:12:45 PM By: Linton Ham MD Entered By: Linton Ham on 07/29/2020 08:55:23 -------------------------------------------------------------------------------- SuperBill Details Patient Name: Date  of Service: ISAM, UNREIN 07/29/2020 Medical Record Number: [id number] Patient Account Number: [id number] Date of Birth/Sex: Treating RN: 10/06/1956 (64 y.o. 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, unspecified Facility Procedures CPT4 Code: [id number] Description: 23557 - DEB SUBQ TISSUE 20 SQ CM/< ICD-10 Diagnosis Description L89.310 Pressure ulcer of right buttock, unstageable Modifier: Quantity: 1 CPT4 Code: [id number] ICD L Description: 70623 - DEB SUBQ TISS EA ADDL 20CM -10 Diagnosis Description 89.310 Pressure ulcer of right buttock, unstageable Modifier: Quantity: 1 Physician Procedures : CPT4 Code Description Modifier [id number] 11042 - WC PHYS SUBQ TISS 20 SQ CM ICD-10 Diagnosis Description L89.310 Pressure ulcer of right buttock, unstageable Quantity: 1 : [id number] 37106 - WC PHYS SUBQ TISS EA ADDL 20 CM ICD-10 Diagnosis Description L89.310 Pressure ulcer of right buttock, unstageable Quantity: 1 Electronic Signature(s) Signed: 07/29/2020 5:12:45 PM By: Linton Ham MD Entered By: Linton Ham on 07/29/2020 08:55:45

## 2020-08-04 NOTE — Progress Notes (Signed)
Nicholas, Duran ([id number]) Visit Report for 07/29/2020 Arrival Information Details Patient Name: Date of Service: [name], [name] 07/29/2020 8:00 A M Medical Record Number: [id number] Patient Account Number: [id number] Date of Birth/Sex: Treating RN: [birth year] (64 y.o. [name]) Carlene Coria Primary Care Donshay Lupinski: [name] Other Clinician: Referring Mayo Faulk: Treating Uchenna Seufert/Extender: Randon Goldsmith in Treatment: 7 Visit Information History Since Last Visit Added or deleted any medications: No Patient Arrived: Ambulatory Any new allergies or adverse reactions: No Arrival Time: 08:07 Had a fall or experienced change in No Accompanied By: self activities of daily living that may affect Transfer Assistance: None risk of falls: Patient Identification Verified: Yes Signs or symptoms of abuse/neglect since last visito No Secondary Verification Process Completed: Yes Hospitalized since last visit: No Patient Requires Transmission-Based Precautions: No Implantable device outside of the clinic excluding No Patient Has Alerts: Yes cellular tissue based products placed in the center Patient Alerts: Right ABI/TBI: 1.35 since last visit: Has Dressing in Place as Prescribed: Yes Pain Present Now: Yes Electronic Signature(s) Signed: 07/29/2020 10:35:16 AM By: Sandre Kitty Entered By: Sandre Kitty on 07/29/2020 08:09:16 -------------------------------------------------------------------------------- Encounter Discharge Information Details Patient Name: Date of Service: [name] EL L. 07/29/2020 8:00 A M Medical Record Number: [id number] Patient Account Number: [id number] Date of Birth/Sex: Treating RN: 06-26-1956 (64 y.o. Marvis Repress Primary Care Sullivan Jacuinde: [name] Other Clinician: Referring Odette Watanabe: Treating Hardie Veltre/Extender: Randon Goldsmith in Treatment: 7 Encounter Discharge Information Items Post Procedure  Vitals Discharge Condition: Stable Temperature (F): 98.3 Ambulatory Status: Ambulatory Pulse (bpm): 95 Discharge Destination: Home Respiratory Rate (breaths/min): 19 Transportation: Private Auto Blood Pressure (mmHg): 121/77 Accompanied By: self Schedule Follow-up Appointment: Yes Clinical Summary of Care: Patient Declined Electronic Signature(s) Signed: 07/31/2020 4:34:39 PM By: Kela Millin Entered By: Kela Millin on 07/29/2020 09:05:14 -------------------------------------------------------------------------------- Lower Extremity Assessment Details Patient Name: Date of Service: [name], Duran EL L. 07/29/2020 8:00 A M Medical Record Number: [id number] Patient Account Number: [id number] Date of Birth/Sex: Treating RN: 1956/01/31 (64 y.o. Marvis Repress Primary Care Pesach Frisch: [name] Other Clinician: Referring Assata Juncaj: Treating Wendelin Bradt/Extender: Randon Goldsmith in Treatment: 7 Electronic Signature(s) Signed: 07/31/2020 4:34:39 PM By: Kela Millin Entered By: Kela Millin on 07/29/2020 08:16:10 -------------------------------------------------------------------------------- Multi Wound Chart Details Patient Name: Date of Service: [name] EL L. 07/29/2020 8:00 A M Medical Record Number: [id number] Patient Account Number: [id number] Date of Birth/Sex: Treating RN: Apr 06, 1956 (64 y.o. [name]) Carlene Coria Primary Care Shylie Polo: [name] Other Clinician: Referring Malya Cirillo: Treating Enrrique Mierzwa/Extender: Randon Goldsmith in Treatment: 7 Vital Signs Height(in): 73 Pulse(bpm): 95 Weight(lbs): 250 Blood Pressure(mmHg): 121/77 Body Mass Index(BMI): 33 Temperature(F): 98.3 Respiratory Rate(breaths/min): 19 Photos: [3:No Photos Right, Distal Gluteus] [4:No Photos Right, Medial Gluteus] [N/A:N/A N/A] Wound Location: [3:Gradually Appeared] [4:Gradually Appeared] [N/A:N/A] Wounding Event: [3:Pressure  Ulcer] [4:Atypical] [N/A:N/A] Primary Etiology: [3:Cataracts, Sleep Apnea, Congestive] [4:Cataracts, Sleep Apnea, Congestive N/A] Comorbid History: [3:Heart Failure, Hypertension, Hepatitis C, Type II Diabetes, End Stage Renal C, Type II Diabetes, End Stage Renal Disease, Neuropathy 03/15/2020] [4:Disease, Neuropathy 06/24/2020] [N/A:N/A] Date Acquired: [3:7] [4:5] [N/A:N/A] Weeks of Treatment: [3:Open] [4:Open] [N/A:N/A] Wound Status: [3:No] [4:Yes] [N/A:N/A] Clustered Wound: [3:N/A] [4:5] [N/A:N/A] Clustered Quantity: [3:8x3.9x2.7] [4:1x2.6x0.5] [N/A:N/A] Measurements L x W x D (cm) [3:24.504] [4:2.042] [N/A:N/A] A (cm) : rea [3:66.162] [4:1.021] [N/A:N/A] Volume (cm) : [3:-5.50%] [4:86.50%] [N/A:N/A] % Reduction in Area: [3:-35.60%] [4:32.50%] [N/A:N/A] % Reduction in Volume: [3:Unstageable/Unclassified] [4:Full Thickness Without Exposed] [N/A:N/A] Classification: [3:Medium] [4:Support Structures Medium] [N/A:N/A] Exudate A  mount: [3:Purulent] [4:Purulent] [N/A:N/A] Exudate Type: [3:yellow, brown, green] [4:yellow, brown, green] [N/A:N/A] Exudate Color: [3:Well defined, not attached] [4:Well defined, not attached] [N/A:N/A] Wound Margin: [3:Small (1-33%)] [4:Small (1-33%)] [N/A:N/A] Granulation Amount: [3:Pink] [4:Pink] [N/A:N/A] Granulation Quality: [3:Large (67-100%)] [4:Large (67-100%)] [N/A:N/A] Necrotic Amount: [3:Eschar, Adherent Slough] [4:Adherent Slough] [N/A:N/A] Necrotic Tissue: [3:Fat Layer (Subcutaneous Tissue): Yes Fat Layer (Subcutaneous Tissue): Yes N/A] Exposed Structures: [3:Fascia: No Tendon: No Muscle: No Joint: No Bone: No Small (1-33%)] [4:Fascia: No Tendon: No Muscle: No Joint: No Bone: No Medium (34-66%)] [N/A:N/A] Epithelialization: [3:Debridement - Excisional] [4:N/A] [N/A:N/A] Debridement: [3:08:35] [4:N/A] [N/A:N/A] Pre-procedure Verification/Time Out Taken: [3:Lidocaine 5% topical ointment] [4:N/A] [N/A:N/A] Pain Control: [3:Subcutaneous, Slough] [4:N/A]  [N/A:N/A] Tissue Debrided: [3:Skin/Subcutaneous Tissue] [4:N/A] [N/A:N/A] Level: [3:31.2] [4:N/A] [N/A:N/A] Debridement A (sq cm): [3:rea Blade, Forceps] [4:N/A] [N/A:N/A] Instrument: [3:Moderate] [4:N/A] [N/A:N/A] Bleeding: [3:Pressure] [4:N/A] [N/A:N/A] Hemostasis A chieved: [3:4] [4:N/A] [N/A:N/A] Procedural Pain: [3:0] [4:N/A] [N/A:N/A] Post Procedural Pain: [3:Procedure was tolerated well] [4:N/A] [N/A:N/A] Debridement Treatment Response: [3:8x3.9x2.7] [4:N/A] [N/A:N/A] Post Debridement Measurements L x W x D (cm) [3:66.162] [4:N/A] [N/A:N/A] Post Debridement Volume: (cm) [3:Unstageable/Unclassified] [4:N/A] [N/A:N/A] Post Debridement Stage: [3:Debridement] [4:N/A] [N/A:N/A] Treatment Notes Electronic Signature(s) Signed: 07/29/2020 5:12:45 PM By: Linton Ham MD Signed: 08/04/2020 1:15:48 PM By: Carlene Coria RN Entered By: Linton Ham on 07/29/2020 08:48:26 -------------------------------------------------------------------------------- Multi-Disciplinary Care Plan Details Patient Name: Date of Service: [name] EL L. 07/29/2020 8:00 A M Medical Record Number: [id number] Patient Account Number: [id number] Date of Birth/Sex: Treating RN: April 30, 1956 (64 y.o. [name] Primary Care Baylen Dea: [name] Other Clinician: Referring Kelon Easom: Treating Kedra Mcglade/Extender: Randon Goldsmith in Treatment: 7 Active Inactive Wound/Skin Impairment Nursing Diagnoses: Impaired tissue integrity Knowledge deficit related to ulceration/compromised skin integrity Goals: Patient/caregiver will verbalize understanding of skin care regimen Date Initiated: 06/05/2020 Target Resolution Date: 08/04/2020 Goal Status: Active Interventions: Assess patient/caregiver ability to obtain necessary supplies Assess patient/caregiver ability to perform ulcer/skin care regimen upon admission and as needed Assess ulceration(s) every visit Provide education on ulcer and  skin care Notes: Electronic Signature(s) Signed: 08/04/2020 1:15:48 PM By: Carlene Coria RN Entered By: Carlene Coria on 07/29/2020 08:14:33 -------------------------------------------------------------------------------- Pain Assessment Details Patient Name: Date of Service: ABE, SCHOOLS 07/29/2020 8:00 A M Medical Record Number: [id number] Patient Account Number: [id number] Date of Birth/Sex: Treating RN: Dec 23, 1955 (64 y.o. [name] Primary Care Doxie Augenstein: [name] Other Clinician: Referring Ajanay Farve: Treating Kohlton Gilpatrick/Extender: Randon Goldsmith in Treatment: 7 Active Problems Location of Pain Severity and Description of Pain Patient Has Paino Yes Site Locations Rate the pain. Current Pain Level: 5 Pain Management and Medication Current Pain Management: Electronic Signature(s) Signed: 07/29/2020 10:35:16 AM By: Sandre Kitty Signed: 08/04/2020 1:15:48 PM By: Carlene Coria RN Entered By: Sandre Kitty on 07/29/2020 08:09:40 -------------------------------------------------------------------------------- Patient/Caregiver Education Details Patient Name: Date of Service: Ladona Mow 9/14/2021andnbsp8:00 [place] Record Number: [id number] Patient Account Number: [id number] Date of Birth/Gender: Treating RN: January 11, 1956 (64 y.o. [name] Primary Care Physician: [name] Other Clinician: Referring Physician: Treating Physician/Extender: Randon Goldsmith in Treatment: 7 Education Assessment Education Provided To: Patient Education Topics Provided Wound/Skin Impairment: Methods: Explain/Verbal Responses: State content correctly Electronic Signature(s) Signed: 08/04/2020 1:15:48 PM By: Carlene Coria RN Entered By: Carlene Coria on 07/29/2020 08:14:50 -------------------------------------------------------------------------------- Wound Assessment Details Patient Name: Date of Service: GUIDO, COMP  EL L. 07/29/2020 8:00 A M Medical Record Number: [id number] Patient Account Number: [id number] Date of Birth/Sex: Treating RN: 1956-05-08 (64 y.o. Marvis Repress Primary  Care Caoimhe Damron: [name] Other Clinician: Referring Selin Eisler: Treating Andrea Colglazier/Extender: Randon Goldsmith in Treatment: 7 Wound Status Wound Number: 3 Primary Pressure Ulcer Etiology: Wound Location: Right, Distal Gluteus Wound Open Wounding Event: Gradually Appeared Status: Date Acquired: 03/15/2020 Comorbid Cataracts, Sleep Apnea, Congestive Heart Failure, Hypertension, Weeks Of Treatment: 7 History: Hepatitis C, Type II Diabetes, End Stage Renal Disease, Clustered Wound: No Neuropathy Photos Photo Uploaded By: Mikeal Hawthorne on 07/31/2020 11:38:58 Wound Measurements Length: (cm) 8 Width: (cm) 3.9 Depth: (cm) 2.7 Area: (cm) 24.504 Volume: (cm) 66.162 % Reduction in Area: -5.5% % Reduction in Volume: -35.6% Epithelialization: Small (1-33%) Tunneling: No Undermining: No Wound Description Classification: Unstageable/Unclassified Wound Margin: Well defined, not attached Exudate Amount: Medium Exudate Type: Purulent Exudate Color: yellow, brown, green Foul Odor After Cleansing: No Slough/Fibrino Yes Wound Bed Granulation Amount: Small (1-33%) Exposed Structure Granulation Quality: Pink Fascia Exposed: No Necrotic Amount: Large (67-100%) Fat Layer (Subcutaneous Tissue) Exposed: Yes Necrotic Quality: Eschar, Adherent Slough Tendon Exposed: No Muscle Exposed: No Joint Exposed: No Bone Exposed: No Treatment Notes Wound #3 (Right, Distal Gluteus) 1. Cleanse With Wound Cleanser 2. Periwound Care Skin Prep 3. Primary Dressing Applied Santyl 4. Secondary Dressing ABD Pad Other secondary dressing (specify in notes) 5. Secured With Tape Notes lightly moistened saline gauze over santyl Electronic Signature(s) Signed: 07/31/2020 4:34:39 PM By: Kela Millin Entered  By: Kela Millin on 07/29/2020 08:18:12 -------------------------------------------------------------------------------- Wound Assessment Details Patient Name: Date of Service: [name] EL L. 07/29/2020 8:00 A M Medical Record Number: [id number] Patient Account Number: [id number] Date of Birth/Sex: Treating RN: 09-27-1956 (64 y.o. Marvis Repress Primary Care Cayne Yom: [name] Other Clinician: Referring Azari Janssens: Treating Aiysha Jillson/Extender: Randon Goldsmith in Treatment: 7 Wound Status Wound Number: 4 Primary Atypical Etiology: Wound Location: Right, Medial Gluteus Wound Open Wounding Event: Gradually Appeared Status: Date Acquired: 06/24/2020 Comorbid Cataracts, Sleep Apnea, Congestive Heart Failure, Hypertension, Weeks Of Treatment: 5 History: Hepatitis C, Type II Diabetes, End Stage Renal Disease, Clustered Wound: Yes Neuropathy Photos Photo Uploaded By: Mikeal Hawthorne on 07/31/2020 11:38:32 Wound Measurements Length: (cm) 1 Width: (cm) 2.6 Depth: (cm) 0.5 Clustered Quantity: 5 Area: (cm) 2.0 Volume: (cm) 1.0 % Reduction in Area: 86.5% % Reduction in Volume: 32.5% Epithelialization: Medium (34-66%) Tunneling: No 42 Undermining: No 21 Wound Description Classification: Full Thickness Without Exposed Support Structures Wound Margin: Well defined, not attached Exudate Amount: Medium Exudate Type: Purulent Exudate Color: yellow, brown, green Foul Odor After Cleansing: No Slough/Fibrino Yes Wound Bed Granulation Amount: Small (1-33%) Exposed Structure Granulation Quality: Pink Fascia Exposed: No Necrotic Amount: Large (67-100%) Fat Layer (Subcutaneous Tissue) Exposed: Yes Necrotic Quality: Adherent Slough Tendon Exposed: No Muscle Exposed: No Joint Exposed: No Bone Exposed: No Treatment Notes Wound #4 (Right, Medial Gluteus) 1. Cleanse With Wound Cleanser 2. Periwound Care Skin Prep 3. Primary Dressing  Applied Santyl 4. Secondary Dressing ABD Pad Other secondary dressing (specify in notes) 5. Secured With Tape Notes lightly moistened saline gauze over santyl Electronic Signature(s) Signed: 07/31/2020 4:34:39 PM By: Kela Millin Entered By: Kela Millin on 07/29/2020 08:16:52 -------------------------------------------------------------------------------- Vitals Details Patient Name: Date of Service: [name] EL L. 07/29/2020 8:00 A M Medical Record Number: [id number] Patient Account Number: [id number] Date of Birth/Sex: Treating RN: 1956/11/01 (64 y.o. [name] Primary Care Addi Pak: [name] Other Clinician: Referring Jaci Desanto: Treating Nalaysia Manganiello/Extender: Randon Goldsmith in Treatment: 7 Vital Signs Time Taken: 08:09 Temperature (F): 98.3 Height (in): 73 Pulse (bpm): 95 Weight (lbs): 250 Respiratory Rate (  breaths/min): 19 Body Mass Index (BMI): 33 Blood Pressure (mmHg): 121/77 Reference Range: 80 - 120 mg / dl Electronic Signature(s) Signed: 07/29/2020 10:35:16 AM By: Sandre Kitty Entered By: Sandre Kitty on 07/29/2020 08:09:32

## 2020-08-12 ENCOUNTER — Encounter (HOSPITAL_BASED_OUTPATIENT_CLINIC_OR_DEPARTMENT_OTHER): Payer: Medicare (Managed Care) | Admitting: Internal Medicine

## 2020-08-19 ENCOUNTER — Encounter (HOSPITAL_BASED_OUTPATIENT_CLINIC_OR_DEPARTMENT_OTHER): Payer: PRIVATE HEALTH INSURANCE | Admitting: Internal Medicine

## 2020-08-21 ENCOUNTER — Other Ambulatory Visit (HOSPITAL_COMMUNITY)
Admission: RE | Admit: 2020-08-21 | Discharge: 2020-08-21 | Disposition: A | Discharge status: Home / Self Care | Payer: Medicare (Managed Care) | Source: Other Acute Inpatient Hospital | Attending: Internal Medicine | Admitting: Internal Medicine

## 2020-08-21 ENCOUNTER — Encounter (HOSPITAL_BASED_OUTPATIENT_CLINIC_OR_DEPARTMENT_OTHER): Payer: Medicare (Managed Care) | Attending: Internal Medicine | Admitting: Internal Medicine

## 2020-08-21 DIAGNOSIS — I132 Hypertensive heart and chronic kidney disease with heart failure and with stage 5 chronic kidney disease, or end stage renal disease: Secondary | ICD-10-CM | POA: Insufficient documentation

## 2020-08-21 DIAGNOSIS — E11622 Type 2 diabetes mellitus with other skin ulcer: Secondary | ICD-10-CM | POA: Insufficient documentation

## 2020-08-21 DIAGNOSIS — Z992 Dependence on renal dialysis: Secondary | ICD-10-CM | POA: Insufficient documentation

## 2020-08-21 DIAGNOSIS — L97822 Non-pressure chronic ulcer of other part of left lower leg with fat layer exposed: Secondary | ICD-10-CM | POA: Insufficient documentation

## 2020-08-21 DIAGNOSIS — S61253A Open bite of left middle finger without damage to nail, initial encounter: Secondary | ICD-10-CM | POA: Diagnosis not present

## 2020-08-21 DIAGNOSIS — L89313 Pressure ulcer of right buttock, stage 3: Secondary | ICD-10-CM | POA: Diagnosis not present

## 2020-08-21 DIAGNOSIS — I509 Heart failure, unspecified: Secondary | ICD-10-CM | POA: Insufficient documentation

## 2020-08-21 DIAGNOSIS — Z8616 Personal history of COVID-19: Secondary | ICD-10-CM | POA: Diagnosis not present

## 2020-08-21 DIAGNOSIS — I872 Venous insufficiency (chronic) (peripheral): Secondary | ICD-10-CM | POA: Diagnosis not present

## 2020-08-21 DIAGNOSIS — E1122 Type 2 diabetes mellitus with diabetic chronic kidney disease: Secondary | ICD-10-CM | POA: Insufficient documentation

## 2020-08-21 DIAGNOSIS — Z87891 Personal history of nicotine dependence: Secondary | ICD-10-CM | POA: Insufficient documentation

## 2020-08-21 DIAGNOSIS — M70942 Unspecified soft tissue disorder related to use, overuse and pressure, left hand: Secondary | ICD-10-CM | POA: Insufficient documentation

## 2020-08-21 DIAGNOSIS — X58XXXA Exposure to other specified factors, initial encounter: Secondary | ICD-10-CM | POA: Insufficient documentation

## 2020-08-21 DIAGNOSIS — N186 End stage renal disease: Secondary | ICD-10-CM | POA: Insufficient documentation

## 2020-08-22 ENCOUNTER — Other Ambulatory Visit (HOSPITAL_COMMUNITY): Payer: Self-pay | Admitting: Internal Medicine

## 2020-08-22 DIAGNOSIS — S41102A Unspecified open wound of left upper arm, initial encounter: Secondary | ICD-10-CM

## 2020-08-22 NOTE — Progress Notes (Signed)
[name], [name] ([id number]) Visit Report for 08/21/2020 Arrival Information Details Patient Name: Date of Service: [name] 08/21/2020 7:30 A M Medical Record Number: [id number] Patient Account Number: [id number] Date of Birth/Sex: Treating RN: 10-Sep-1956 (64 y.o. [name] Primary Care Aida Lemaire: [name] Other Clinician: Referring Krishauna Schatzman: Treating Stetson Pelaez/Extender: Randon Goldsmith in Treatment: 11 Visit Information History Since Last Visit Added or deleted any medications: No Patient Arrived: Ambulatory Any new allergies or adverse reactions: No Arrival Time: 07:52 Had a fall or experienced change in No Accompanied By: self activities of daily living that may affect Transfer Assistance: None risk of falls: Patient Identification Verified: Yes Signs or symptoms of abuse/neglect since last visito No Secondary Verification Process Completed: Yes Hospitalized since last visit: No Patient Requires Transmission-Based Precautions: No Implantable device outside of the clinic excluding No Patient Has Alerts: Yes cellular tissue based products placed in the center Patient Alerts: Right ABI/TBI: 1.35 since last visit: Has Dressing in Place as Prescribed: Yes Pain Present Now: Yes Electronic Signature(s) Signed: 08/21/2020 1:11:06 PM By: Sandre Kitty Entered By: Sandre Kitty on 08/21/2020 07:52:58 -------------------------------------------------------------------------------- Encounter Discharge Information Details Patient Name: Date of Service: Nicholas Sheen EL L. 08/21/2020 7:30 A M Medical Record Number: [id number] Patient Account Number: [id number] Date of Birth/Sex: Treating RN: [birth year] (64 y.o. [name] Primary Care [name]: [name] Other Clinician: Referring Amma Crear: Treating Lev Cervone/Extender: Randon Goldsmith in Treatment: 11 Encounter Discharge Information Items Post Procedure  Vitals Discharge Condition: Stable Temperature (F): 98.1 Ambulatory Status: Ambulatory Pulse (bpm): 90 Discharge Destination: Home Respiratory Rate (breaths/min): 18 Transportation: Private Auto Blood Pressure (mmHg): 99/66 Accompanied By: self Schedule Follow-up Appointment: Yes Clinical Summary of Care: Patient Declined Electronic Signature(s) Signed: 08/21/2020 6:05:34 PM By: Baruch Gouty RN, BSN Entered By: Baruch Gouty on 08/21/2020 09:26:16 -------------------------------------------------------------------------------- Lower Extremity Assessment Details Patient Name: Date of Service: Nicholas Sheen EL L. 08/21/2020 7:30 A M Medical Record Number: [id number] Patient Account Number: [id number] Date of Birth/Sex: Treating RN: 04-15-1956 (64 y.o. [name] Primary Care Mirza Fessel: [name] Other Clinician: Referring Jayce Boyko: Treating Phuc Kluttz/Extender: Randon Goldsmith in Treatment: 11 Edema Assessment Assessed: [name]: No] [name]: Yes] Edema: [Left: Ye] [Right: s] Calf Left: Right: Point of Measurement: From Medial Instep 41 cm Ankle Left: Right: Point of Measurement: From Medial Instep 26 cm Vascular Assessment Pulses: Dorsalis Pedis Palpable: [Right:Yes] Electronic Signature(s) Signed: 08/21/2020 6:22:31 PM By: Deon Pilling Entered By: Deon Pilling on 08/21/2020 08:08:53 -------------------------------------------------------------------------------- Multi Wound Chart Details Patient Name: Date of Service: Nicholas Mow. 08/21/2020 7:30 A M Medical Record Number: [id number] Patient Account Number: [id number] Date of Birth/Sex: Treating RN: 1956-01-22 (64 y.o. [name] Primary Care Jona Zappone: [name] Other Clinician: Referring Nalini Alcaraz: Treating Murline Weigel/Extender: Randon Goldsmith in Treatment: 11 Vital Signs Height(in): 52 Pulse(bpm): 66 Weight(lbs): 250 Blood Pressure(mmHg):  99/66 Body Mass Index(BMI): 33 Temperature(F): 98.1 Respiratory Rate(breaths/min): 90 Photos: [3:No Photos Right, Distal Gluteus] [4:No Photos Right, Medial Gluteus] [5:No Photos Left Hand - 2nd Digit] Wound Location: [3:Gradually Appeared] [4:Gradually Appeared] [5:Bite] Wounding Event: [3:Pressure Ulcer] [4:Atypical] [5:T be determined o] Primary Etiology: [3:N/A] [4:N/A] [5:N/A] Secondary Etiology: [3:Cataracts, Sleep Apnea, Congestive] [4:Cataracts, Sleep Apnea, Congestive] [5:Cataracts, Sleep Apnea, Congestive] Comorbid History: [3:Heart Failure, Hypertension, Hepatitis C, Type II Diabetes, End Stage Renal Disease, Neuropathy 03/15/2020] [4:Heart Failure, Hypertension, Hepatitis C, Type II Diabetes, End Stage Renal Disease, Neuropathy 06/24/2020] [5:Heart Failure,  Hypertension, Hepatitis C, Type II Diabetes, End Stage Renal  Disease, Neuropathy 08/07/2020] Date Acquired: [3:11] [4:8] [5:0] Weeks of Treatment: [3:Open] [4:Open] [5:Open] Wound Status: [3:No] [4:Yes] [5:No] Clustered Wound: [3:N/A] [4:5] [5:N/A] Clustered Quantity: [3:1.4x1.5x1.3] [4:0.3x0.4x0.1] [5:1.5x1.1x0.2] Measurements L x W x D (cm) [3:1.649] [4:0.094] [5:1.296] A (cm) : rea [3:2.144] [4:0.009] [5:0.259] Volume (cm) : [3:92.90%] [4:99.40%] [5:0.00%] % Reduction in Area: [3:95.60%] [4:99.40%] [5:0.00%] % Reduction in Volume: [3:Unstageable/Unclassified] [4:Full Thickness Without Exposed] [5:Full Thickness With Exposed Support] Classification: [3:Large] [4:Support Structures Medium] [5:Structures Small] Exudate A mount: [3:Purulent] [4:Purulent] [5:Serous] Exudate Type: [3:yellow, brown, green] [4:yellow, brown, green] [5:amber] Exudate Color: [3:Well defined, not attached] [4:Distinct, outline attached] [5:Well defined, not attached] Wound Margin: [3:Medium (34-66%)] [4:Medium (34-66%)] [5:Medium (34-66%)] Granulation A mount: [3:Red, Pink] [4:Pink] [5:Pink] Granulation Quality: [3:Medium (34-66%)] [4:Medium  (34-66%)] [5:Medium (34-66%)] Necrotic A mount: [3:Adherent Slough] [4:Adherent Slough] [5:Adherent Slough] Necrotic Tissue: [3:Fat Layer (Subcutaneous Tissue): Yes Fat Layer (Subcutaneous Tissue): Yes Fat Layer (Subcutaneous Tissue): Yes] Exposed Structures: [3:Fascia: No Tendon: No Muscle: No Joint: No Bone: No Small (1-33%)] [4:Fascia: No Tendon: No Muscle: No Joint: No Bone: No Medium (34-66%)] [5:Bone: Yes Fascia: No Tendon: No Muscle: No Joint: No N/A] Epithelialization: [3:N/A] [4:N/A] [5:Debridement - Selective/Open Wound] Debridement: Pre-procedure Verification/Time Out N/A [4:N/A] [5:08:50] Taken: [3:N/A] [4:N/A] [5:Lidocaine 4% Topical Solution] Pain Control: [3:N/A] [4:N/A] [5:Necrotic/Eschar] Tissue Debrided: [3:N/A] [4:N/A] [5:Non-Viable Tissue] Level: [3:N/A] [4:N/A] [5:1.65] Debridement A (sq cm): [3:rea N/A] [4:N/A] [5:Blade, Forceps] Instrument: [3:N/A] [4:N/A] [5:Swab] Specimen: [3:N/A] [4:N/A] [5:1] Number of Specimens Taken: [3:N/A] [4:N/A] [5:Minimum] Bleeding: [3:N/A] [4:N/A] [5:Pressure] Hemostasis A chieved: [3:N/A] [4:N/A] [5:0] Procedural Pain: [3:N/A] [4:N/A] [5:2] Post Procedural Pain: [3:N/A] [4:N/A] [5:Procedure was tolerated well] Debridement Treatment Response: [3:N/A] [4:N/A] [5:1.5x1.1x0.2] Post Debridement Measurements L x W x D (cm) [3:N/A] [4:N/A] [5:0.259] Post Debridement Volume: (cm) [3:N/A] [4:N/A] [5:Debridement] Wound Number: 6 7 N/A Photos: No Photos No Photos N/A Left, Lateral Lower Leg Right, Proximal Gluteus N/A Wound Location: Gradually Appeared Gradually Appeared N/A Wounding Event: Venous Leg Ulcer Atypical N/A Primary Etiology: Trauma, Other N/A N/A Secondary Etiology: Cataracts, Sleep Apnea, Congestive Cataracts, Sleep Apnea, Congestive N/A Comorbid History: Heart Failure, Hypertension, Hepatitis Heart Failure, Hypertension, Hepatitis C, Type II Diabetes, End Stage RenalC, Type II Diabetes, End Stage Renal Disease,  Neuropathy Disease, Neuropathy 08/15/2020 08/15/2020 N/A Date Acquired: 0 0 N/A Weeks of Treatment: Open Open N/A Wound Status: No No N/A Clustered Wound: N/A N/A N/A Clustered Quantity: 1x1x0.1 2.2x3x0.7 N/A Measurements L x W x D (cm) 0.785 5.184 N/A A (cm) : rea 0.079 3.629 N/A Volume (cm) : N/A N/A N/A % Reduction in A rea: N/A N/A N/A % Reduction in Volume: Unclassifiable Full Thickness Without Exposed N/A Classification: Support Structures None Present Medium N/A Exudate A mount: N/A Purulent N/A Exudate Type: N/A yellow, brown, green N/A Exudate Color: Distinct, outline attached Distinct, outline attached N/A Wound Margin: None Present (0%) Medium (34-66%) N/A Granulation A mount: N/A Red, Pink N/A Granulation Quality: Large (67-100%) Medium (34-66%) N/A Necrotic A mount: Eschar Adherent Slough N/A Necrotic Tissue: Fat Layer (Subcutaneous Tissue): Yes Fat Layer (Subcutaneous Tissue): Yes N/A Exposed Structures: Fascia: No Fascia: No Tendon: No Tendon: No Muscle: No Muscle: No Joint: No Joint: No Bone: No Bone: No None None N/A Epithelialization: Debridement - Selective/Open Wound N/A N/A Debridement: Pre-procedure Verification/Time Out 08:50 N/A N/A Taken: Lidocaine 4% Topical Solution N/A N/A Pain Control: Necrotic/Eschar N/A N/A Tissue Debrided: Non-Viable Tissue N/A N/A Level: 1 N/A N/A Debridement A (sq cm): rea Curette N/A N/A Instrument: Minimum N/A N/A Bleeding: Pressure N/A N/A  Hemostasis A chieved: 0 N/A N/A Procedural Pain: 2 N/A N/A Post Procedural Pain: Procedure was tolerated well N/A N/A Debridement Treatment Response: 1x1x0.1 N/A N/A Post Debridement Measurements L x W x D (cm) 0.079 N/A N/A Post Debridement Volume: (cm) Debridement N/A N/A Procedures Performed: Treatment Notes Electronic Signature(s) Signed: 08/21/2020 6:22:31 PM By: Deon Pilling Signed: 08/22/2020 8:12:45 AM By: Linton Ham  MD Entered By: Linton Ham on 08/21/2020 09:01:45 -------------------------------------------------------------------------------- Multi-Disciplinary Care Plan Details Patient Name: Date of Service: Nicholas Sheen EL L. 08/21/2020 7:30 A M Medical Record Number: [id number] Patient Account Number: [id number] Date of Birth/Sex: Treating RN: 06-13-1956 (64 y.o. [name] Primary Care Diante Barley: [name] Other Clinician: Referring Branon Sabine: Treating Hartley Wyke/Extender: Randon Goldsmith in Treatment: 11 Active Inactive Wound/Skin Impairment Nursing Diagnoses: Impaired tissue integrity Knowledge deficit related to ulceration/compromised skin integrity Goals: Patient/caregiver will verbalize understanding of skin care regimen Date Initiated: 06/05/2020 Target Resolution Date: 09/12/2020 Goal Status: Active Interventions: Assess patient/caregiver ability to obtain necessary supplies Assess patient/caregiver ability to perform ulcer/skin care regimen upon admission and as needed Assess ulceration(s) every visit Provide education on ulcer and skin care Notes: Electronic Signature(s) Signed: 08/21/2020 6:22:31 PM By: Deon Pilling Entered By: Deon Pilling on 08/21/2020 07:55:45 -------------------------------------------------------------------------------- Pain Assessment Details Patient Name: Date of Service: [name], [name] 08/21/2020 7:30 A M Medical Record Number: [id number] Patient Account Number: [id number] Date of Birth/Sex: Treating RN: Aug 20, 1956 (64 y.o. [name] Primary Care Ziad Maye: [name] Other Clinician: Referring Jerick Khachatryan: Treating Jerrold Haskell/Extender: Randon Goldsmith in Treatment: 11 Active Problems Location of Pain Severity and Description of Pain Patient Has Paino Yes Site Locations Rate the pain. Current Pain Level: 6 Pain Management and Medication Current Pain Management: Electronic  Signature(s) Signed: 08/21/2020 1:11:06 PM By: Sandre Kitty Signed: 08/21/2020 6:22:31 PM By: Deon Pilling Entered By: Sandre Kitty on 08/21/2020 07:56:11 -------------------------------------------------------------------------------- Patient/Caregiver Education Details Patient Name: Date of Service: Nicholas Mow 10/7/2021andnbsp7:30 [place] Record Number: [id number] Patient Account Number: [id number] Date of Birth/Gender: Treating RN: April 16, 1956 (64 y.o. [name] Primary Care Physician: [name] Other Clinician: Referring Physician: Treating Physician/Extender: Randon Goldsmith in Treatment: 11 Education Assessment Education Provided To: Patient Education Topics Provided Wound/Skin Impairment: Handouts: Skin Care Do's and Dont's Methods: Explain/Verbal Responses: Reinforcements needed Electronic Signature(s) Signed: 08/21/2020 6:22:31 PM By: Deon Pilling Signed: 08/21/2020 6:22:31 PM By: Deon Pilling Entered By: Deon Pilling on 08/21/2020 07:55:55 -------------------------------------------------------------------------------- Wound Assessment Details Patient Name: Date of Service: Nicholas Mow. 08/21/2020 7:30 A M Medical Record Number: [id number] Patient Account Number: [id number] Date of Birth/Sex: Treating RN: May 31, 1956 (64 y.o. Marvis Repress Primary Care Stryder Poitra: [name] Other Clinician: Referring Torsten Weniger: Treating Analissa Bayless/Extender: Randon Goldsmith in Treatment: 11 Wound Status Wound Number: 3 Primary Pressure Ulcer Etiology: Wound Location: Right, Distal Gluteus Wound Open Wounding Event: Gradually Appeared Status: Date Acquired: 03/15/2020 Comorbid Cataracts, Sleep Apnea, Congestive Heart Failure, Hypertension, Weeks Of Treatment: 11 History: Hepatitis C, Type II Diabetes, End Stage Renal Disease, Clustered Wound: No Neuropathy Wound Measurements Length: (cm)  1.4 Width: (cm) 1.5 Depth: (cm) 1.3 Area: (cm) 1.649 Volume: (cm) 2.144 % Reduction in Area: 92.9% % Reduction in Volume: 95.6% Epithelialization: Small (1-33%) Tunneling: No Undermining: No Wound Description Classification: Unstageable/Unclassified Wound Margin: Well defined, not attached Exudate Amount: Large Exudate Type: Purulent Exudate Color: yellow, brown, green Foul Odor After Cleansing: No Slough/Fibrino Yes Wound Bed Granulation Amount: Medium (34-66%) Exposed Structure Granulation Quality:  Red, Pink Fascia Exposed: No Necrotic Amount: Medium (34-66%) Fat Layer (Subcutaneous Tissue) Exposed: Yes Necrotic Quality: Adherent Slough Tendon Exposed: No Muscle Exposed: No Joint Exposed: No Bone Exposed: No Treatment Notes Wound #3 (Right, Distal Gluteus) 2. Periwound Care Skin Prep 3. Primary Dressing Applied Calcium Alginate Ag 4. Secondary Dressing ABD Pad 5. Secured With Recruitment consultant) Signed: 08/21/2020 5:54:48 PM By: Kela Millin Entered By: Kela Millin on 08/21/2020 08:17:55 -------------------------------------------------------------------------------- Wound Assessment Details Patient Name: Date of Service: [name], [name] 08/21/2020 7:30 A M Medical Record Number: [id number] Patient Account Number: [id number] Date of Birth/Sex: Treating RN: 05/03/1956 (64 y.o. Marvis Repress Primary Care Mikaylah Libbey: [name] Other Clinician: Referring Daleon Willinger: Treating Zamira Hickam/Extender: Randon Goldsmith in Treatment: 11 Wound Status Wound Number: 4 Primary Atypical Etiology: Wound Location: Right, Medial Gluteus Wound Open Wounding Event: Gradually Appeared Status: Date Acquired: 06/24/2020 Comorbid Cataracts, Sleep Apnea, Congestive Heart Failure, Hypertension, Weeks Of Treatment: 8 History: Hepatitis C, Type II Diabetes, End Stage Renal Disease, Clustered Wound: Yes Neuropathy Wound  Measurements Length: (cm) 0.3 Width: (cm) 0.4 Depth: (cm) 0.1 Clustered Quantity: 5 Area: (cm) 0.0 Volume: (cm) 0.0 % Reduction in Area: 99.4% % Reduction in Volume: 99.4% Epithelialization: Medium (34-66%) Tunneling: No 94 Undermining: No 09 Wound Description Classification: Full Thickness Without Exposed Support Structures Wound Margin: Distinct, outline attached Exudate Amount: Medium Exudate Type: Purulent Exudate Color: yellow, brown, green Foul Odor After Cleansing: No Slough/Fibrino Yes Wound Bed Granulation Amount: Medium (34-66%) Exposed Structure Granulation Quality: Pink Fascia Exposed: No Necrotic Amount: Medium (34-66%) Fat Layer (Subcutaneous Tissue) Exposed: Yes Necrotic Quality: Adherent Slough Tendon Exposed: No Muscle Exposed: No Joint Exposed: No Bone Exposed: No Treatment Notes Wound #4 (Right, Medial Gluteus) 2. Periwound Care Skin Prep 3. Primary Dressing Applied Calcium Alginate Ag 4. Secondary Dressing ABD Pad 5. Secured With Recruitment consultant) Signed: 08/21/2020 5:54:48 PM By: Kela Millin Entered By: Kela Millin on 08/21/2020 08:16:20 -------------------------------------------------------------------------------- Wound Assessment Details Patient Name: Date of Service: Nicholas Mow. 08/21/2020 7:30 A M Medical Record Number: [id number] Patient Account Number: [id number] Date of Birth/Sex: Treating RN: 1956-01-09 (64 y.o. Marvis Repress Primary Care Naftula Donahue: [name] Other Clinician: Referring Tigran Haynie: Treating Kenyatta Keidel/Extender: Randon Goldsmith in Treatment: 11 Wound Status Wound Number: 5 Primary T be determined o Etiology: Wound Location: Left Hand - 3rd Digit Wound Open Wounding Event: Bite Status: Date Acquired: 08/07/2020 Comorbid Cataracts, Sleep Apnea, Congestive Heart Failure, Hypertension, Weeks Of Treatment: 0 History: Hepatitis C, Type II Diabetes, End Stage  Renal Disease, Clustered Wound: No Neuropathy Wound Measurements Length: (cm) 1.5 Width: (cm) 1.1 Depth: (cm) 0.2 Area: (cm) 1.296 Volume: (cm) 0.259 % Reduction in Area: 0% % Reduction in Volume: 0% Tunneling: No Undermining: No Wound Description Classification: Full Thickness With Exposed Support Struc Wound Margin: Well defined, not attached Exudate Amount: Small Exudate Type: Serous Exudate Color: amber tures Slough/Fibrino Yes Wound Bed Granulation Amount: Medium (34-66%) Exposed Structure Granulation Quality: Pink Fascia Exposed: No Necrotic Amount: Medium (34-66%) Fat Layer (Subcutaneous Tissue) Exposed: Yes Necrotic Quality: Adherent Slough Tendon Exposed: No Muscle Exposed: No Joint Exposed: No Bone Exposed: Yes Treatment Notes Wound #5 (Left Hand - 3rd Digit) 3. Primary Dressing Applied Calcium Alginate Ag Other primary dressing (specifiy in notes) 4. Secondary Dressing Dry Gauze Roll Gauze 5. Secured With Tape Notes antibiotic ointment Electronic Signature(s) Signed: 08/21/2020 5:54:48 PM By: Kela Millin Signed: 08/21/2020 6:22:31 PM By: Deon Pilling Entered By: Deon Pilling on 08/21/2020  09:04:42 -------------------------------------------------------------------------------- Wound Assessment Details Patient Name: Date of Service: [name], [name] 08/21/2020 7:30 A M Medical Record Number: [id number] Patient Account Number: [id number] Date of Birth/Sex: Treating RN: 1955/12/06 (64 y.o. Marvis Repress Primary Care Kateri Balch: Other Clinician: [name] Referring Hailey Stormer: Treating Emma Birchler/Extender: Randon Goldsmith in Treatment: 11 Wound Status Wound Number: 6 Primary Venous Leg Ulcer Etiology: Wound Location: Left, Lateral Lower Leg Secondary Trauma, Other Wounding Event: Gradually Appeared Etiology: Date Acquired: 08/15/2020 Wound Open Weeks Of Treatment: 0 Status: Clustered Wound: No Comorbid  Cataracts, Sleep Apnea, Congestive Heart Failure, Hypertension, History: Hepatitis C, Type II Diabetes, End Stage Renal Disease, Neuropathy Wound Measurements Length: (cm) 1 Width: (cm) 1 Depth: (cm) 0.1 Area: (cm) 0.785 Volume: (cm) 0.079 % Reduction in Area: % Reduction in Volume: Epithelialization: None Tunneling: No Undermining: No Wound Description Classification: Unclassifiable Wound Margin: Distinct, outline attached Exudate Amount: None Present Foul Odor After Cleansing: No Slough/Fibrino No Wound Bed Granulation Amount: None Present (0%) Exposed Structure Necrotic Amount: Large (67-100%) Fascia Exposed: No Necrotic Quality: Eschar Fat Layer (Subcutaneous Tissue) Exposed: Yes Tendon Exposed: No Muscle Exposed: No Joint Exposed: No Bone Exposed: No Treatment Notes Wound #6 (Left, Lateral Lower Leg) 2. Periwound Care Skin Prep 3. Primary Dressing Applied Calcium Alginate Ag 4. Secondary Dressing Foam Border Dressing 5. Secured With Recruitment consultant) Signed: 08/21/2020 5:54:48 PM By: Kela Millin Entered By: Kela Millin on 08/21/2020 08:21:23 -------------------------------------------------------------------------------- Wound Assessment Details Patient Name: Date of Service: Nicholas Mow. 08/21/2020 7:30 A M Medical Record Number: [id number] Patient Account Number: [id number] Date of Birth/Sex: Treating RN: Apr 02, 1956 (64 y.o. Marvis Repress Primary Care Siobhan Zaro: [name] Other Clinician: Referring Jerson Furukawa: Treating Jamisen Hawes/Extender: Randon Goldsmith in Treatment: 11 Wound Status Wound Number: 7 Primary Atypical Etiology: Etiology: Wound Location: Right, Proximal Gluteus Wound Open Wounding Event: Gradually Appeared Status: Date Acquired: 08/15/2020 Comorbid Cataracts, Sleep Apnea, Congestive Heart Failure, Hypertension, Weeks Of Treatment: 0 History: Hepatitis C, Type II Diabetes, End  Stage Renal Disease, Clustered Wound: No Neuropathy Wound Measurements Length: (cm) 2.2 Width: (cm) 3 Depth: (cm) 0.7 Area: (cm) 5.184 Volume: (cm) 3.629 % Reduction in Area: % Reduction in Volume: Epithelialization: None Tunneling: No Undermining: No Wound Description Classification: Full Thickness Without Exposed Support Structures Wound Margin: Distinct, outline attached Exudate Amount: Medium Exudate Type: Purulent Exudate Color: yellow, brown, green Foul Odor After Cleansing: No Slough/Fibrino Yes Wound Bed Granulation Amount: Medium (34-66%) Exposed Structure Granulation Quality: Red, Pink Fascia Exposed: No Necrotic Amount: Medium (34-66%) Fat Layer (Subcutaneous Tissue) Exposed: Yes Necrotic Quality: Adherent Slough Tendon Exposed: No Muscle Exposed: No Joint Exposed: No Bone Exposed: No Treatment Notes Wound #7 (Right, Proximal Gluteus) 2. Periwound Care Skin Prep 3. Primary Dressing Applied Calcium Alginate Ag 4. Secondary Dressing ABD Pad 5. Secured With Recruitment consultant) Signed: 08/21/2020 5:54:48 PM By: Kela Millin Entered By: Kela Millin on 08/21/2020 08:22:30 -------------------------------------------------------------------------------- Vitals Details Patient Name: Date of Service: Nicholas Sheen EL L. 08/21/2020 7:30 A M Medical Record Number: [id number] Patient Account Number: [id number] Date of Birth/Sex: Treating RN: Jun 28, 1956 (64 y.o. [name] Primary Care Aris Moman: [name] Other Clinician: Referring Eldean Klatt: Treating Magdalen Cabana/Extender: Randon Goldsmith in Treatment: 11 Vital Signs Time Taken: 07:53 Temperature (F): 98.1 Height (in): 73 Pulse (bpm): 90 Weight (lbs): 250 Respiratory Rate (breaths/min): 19 Body Mass Index (BMI): 33 Blood Pressure (mmHg): 99/66 Reference Range: 80 - 120 mg / dl Electronic Signature(s) Signed: 08/21/2020 1:11:06 PM By:  Dawkins,  Destiny Entered By: Sandre Kitty on 08/21/2020 07:55:55

## 2020-08-22 NOTE — Progress Notes (Signed)
[name] ([id number]) Visit Report for 08/21/2020 Debridement Details Patient Name: Date of Service: [name], [name] 08/21/2020 7:30 A M Medical Record Number: [id number] Patient Account Number: [id number] Date of Birth/Sex: Treating RN: 07-28-1956 (64 y.o. Lorette Ang, Meta.Reding Primary Care Provider: [name] Other Clinician: Referring Provider: Treating Provider/Extender: Randon Goldsmith in Treatment: 11 Debridement Performed for Assessment: Wound #5 Left Hand - 3rd Digit Performed By: Physician Ricard Dillon., MD Debridement Type: Debridement Level of Consciousness (Pre-procedure): Awake and Alert Pre-procedure Verification/Time Out Yes - 08:50 Taken: Start Time: 08:51 Pain Control: Lidocaine 4% T opical Solution T Area Debrided (L x W): otal 1.5 (cm) x 1.1 (cm) = 1.65 (cm) Tissue and other material debrided: Viable, Non-Viable, Eschar, Subcutaneous, Skin: Dermis , Fibrin/Exudate Level: Skin/Subcutaneous Tissue Debridement Description: Excisional Instrument: Blade, Forceps Specimen: Swab, Number of Specimens T aken: 1 Bleeding: Minimum Hemostasis Achieved: Pressure End Time: 08:58 Procedural Pain: 0 Post Procedural Pain: 2 Response to Treatment: Procedure was tolerated well Level of Consciousness (Post- Awake and Alert procedure): Post Debridement Measurements of Total Wound Length: (cm) 1.5 Width: (cm) 1.1 Depth: (cm) 0.2 Volume: (cm) 0.259 Character of Wound/Ulcer Post Debridement: Improved Post Procedure Diagnosis Same as Pre-procedure Electronic Signature(s) Signed: 08/21/2020 6:22:31 PM By: Deon Pilling Signed: 08/22/2020 8:12:45 AM By: Linton Ham MD Entered By: Deon Pilling on 08/21/2020 09:10:05 -------------------------------------------------------------------------------- Debridement Details Patient Name: Date of Service: Nicholas Sheen EL L. 08/21/2020 7:30 A M Medical Record Number: [id number] Patient Account  Number: [id number] Date of Birth/Sex: Treating RN: 21-Jul-1956 (64 y.o. Lorette Ang, Meta.Reding Primary Care Provider: [name] Other Clinician: Referring Provider: Treating Provider/Extender: Randon Goldsmith in Treatment: 11 Debridement Performed for Assessment: Wound #6 Left,Lateral Lower Leg Performed By: Physician Ricard Dillon., MD Debridement Type: Debridement Severity of Tissue Pre Debridement: Fat layer exposed Level of Consciousness (Pre-procedure): Awake and Alert Pre-procedure Verification/Time Out Yes - 08:50 Taken: Start Time: 08:51 Pain Control: Lidocaine 4% T opical Solution T Area Debrided (L x W): otal 1 (cm) x 1 (cm) = 1 (cm) Tissue and other material debrided: Viable, Non-Viable, Eschar, Subcutaneous, Skin: Dermis , Fibrin/Exudate Level: Skin/Subcutaneous Tissue Debridement Description: Excisional Instrument: Curette Bleeding: Minimum Hemostasis Achieved: Pressure End Time: 08:58 Procedural Pain: 0 Post Procedural Pain: 2 Response to Treatment: Procedure was tolerated well Level of Consciousness (Post- Awake and Alert procedure): Post Debridement Measurements of Total Wound Length: (cm) 1 Width: (cm) 1 Depth: (cm) 0.1 Volume: (cm) 0.079 Character of Wound/Ulcer Post Debridement: Improved Severity of Tissue Post Debridement: Fat layer exposed Post Procedure Diagnosis Same as Pre-procedure Electronic Signature(s) Signed: 08/21/2020 6:22:31 PM By: Deon Pilling Signed: 08/22/2020 8:12:45 AM By: Linton Ham MD Entered By: Deon Pilling on 08/21/2020 09:10:18 -------------------------------------------------------------------------------- HPI Details Patient Name: Date of Service: Nicholas Sheen EL L. 08/21/2020 7:30 A M Medical Record Number: [id number] Patient Account Number: [id number] Date of Birth/Sex: Treating RN: 24-Mar-1956 (64 y.o. [name] Primary Care Provider: [name] Other Clinician: Referring  Provider: Treating Provider/Extender: Randon Goldsmith in Treatment: 11 History of Present Illness HPI Description: ADMISSION 06/05/2020 This is a 64 year old man who has been on dialysis secondary to diabetes for about 5 years. He was recently seen by Dr. Amalia Hailey I think for routine and prophylactic nail care and foot care. He mentioned that he had in this area on the right lateral leg and he was referred here for review of this. He has been using gentamicin cream on the wound. He also  uses a cam boot for Charcot ankle. He tells me that he does not have a wound on his left foot or ankle. Almost as a side he mentioned that he had a wound on the right gluteal area. He states that this wound had been present since he was hospitalized on 2 different occasions in March. Firstly from 2/28 through 3/6 for Covid and strep bacteremia. He was rehospitalized from 3/9 through 3/12 secondary to hypotension and hypothermia. It was notable during that admission that he had a wound on the right lateral leg they did not mention the area on the right gluteal. In any case the patient is using gentamicin on this area as well his wife changes the dressing. Past medical history; end-stage renal disease on dialysis secondary to type 2 diabetes, hypertension, hyperlipidemia Charcot ankle on the left foot and ankle, he is an ex-smoker 10 years ago remote history of hepatitis C and congestive heart failure. The patient had vascular studies on 05/01/2020 this showed ABIs on the right at 2.06 but with triphasic and biphasic waveforms his TBI was actually elevated at 1.25. He did not allow the cam boot to be removed and therefore arterial studies on the left were not done 8/10-Patient returns to clinic has continued discomfort in the right gluteal ischial area where he has a wound and less discomfort the right lateral leg wound. We have been using Santyl to these areas. He was biopsied at last visit and the  results indicate inflamed granulation tissue and vasculopathy-the latter seems consistent with calciphylaxis and given the location of wound not clearly pressure etiology this may well be the case 8/17; I have reviewed the pathology report from the punch biopsies I did a few weeks ago. Calciphylaxis was not seen in these sections there was fibrinoid necrosis at the edges. They suggested clinical correlation and/or additional punch biopsies of calciphylaxis is strongly suspected. We are using Santyl to the area on the right buttock. As well as an area on the right leg which I think is a venous insufficiency ulcer, we have been using silver alginate and this is just about healed 8/31; 2-week follow-up. I spoke to his nephrologist after his visit 2 weeks ago we both agreed that thiosulfate at least on a trial basis might be indicated. Although we did not have biopsy-proven calciphylaxis this is certainly clinically what this is. However, apparently the patient states that they have not yet started the thiosulfate at dialysis. He is dialyzing at the [facility]. We have been using Santyl to the wound surface. He has 3 wounds on the right buttock. I think 2 of these have formed since the last time he was here. These were the original satellite lesions I describe last time. the 2 new areas have a completely necrotic surface the other looks somewhat better. The induration and firmness around this area seems somewhat better today. 9/14; 2-week follow-up. He has 3 wounds on the right buttock to the original wound and 2 satellite late lesions. The satellite lesions expanded but have a clean base the original wound is necrotic. He just started on the thiosulfate within the last 2 dialysis sessions. Apparently his nephrologist wants to speak to me about how long to run this. The other worrisome thing here is skin breakdown around the original wound. This looks somewhat angry with a small area loss of  epithelialization. Almost looks like a contact dermatitis. 10/7; the patient has not been here in 3 weeks. He still has 3 areas  to the right buttock the original wound is now split into 2 and a new area. This is the area I thought was calciphylaxis he apparently was receiving thiosulfate at dialysis in [place]. He arrives with an increasing pain over this area He also has 2 new areas on the left lateral upper leg and a necrotic wound on the tip of the left mid finger. He tells me that finger wound has been there for about a month although he has not mentioned it in a body. He thinks he was bitten by something Electronic Signature(s) Signed: 08/22/2020 8:12:45 AM By: Linton Ham MD Entered By: Linton Ham on 08/21/2020 09:03:20 -------------------------------------------------------------------------------- Physical Exam Details Patient Name: Date of Service: [name]. 08/21/2020 7:30 A M Medical Record Number: [id number] Patient Account Number: [id number] Date of Birth/Sex: Treating RN: 12-07-1955 (64 y.o. [name] Primary Care Provider: [name] Other Clinician: Referring Provider: Treating Provider/Extender: Randon Goldsmith in Treatment: 11 Constitutional Blood pressure is low ever V he looks systemically well. Pulse regular and within target range for patient.Marland Kitchen Respirations regular, non-labored and within target range.. Temperature is normal and within the target range for the patient.Marland Kitchen Appears in no distress. Notes Wound exam; Right ischial wound healed. There are 2 wound areas here which I think are remanence of of the original large wound. He has a new wound more medially. Out of the distal part of the original wound there is a copious amounts of purulent drainage. This was cultured. On the left lateral upper lower leg a necrotic surface removed with a #5 curette. I am not certain of the etiology of this. On the left plantar mid  finger another necrotic wound that he says has been there for at least a month. This had necrotic material coming out of his finger. I removed this with pickups and a #15 scalpel and then cultured the underlying tissue. This is a deep wound I can feel a brachial pulse on the left but I cannot feel his radial pulse. His fingers are cool but not cold Electronic Signature(s) Signed: 08/22/2020 8:12:45 AM By: Linton Ham MD Entered By: Linton Ham on 08/21/2020 09:05:16 -------------------------------------------------------------------------------- Physician Orders Details Patient Name: Date of Service: Nicholas Sheen EL L. 08/21/2020 7:30 A M Medical Record Number: [id number] Patient Account Number: [id number] Date of Birth/Sex: Treating RN: 28-Feb-1956 (64 y.o. 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 Dressing Change Frequency Wound #3 Right,Distal Gluteus Change Dressing every other day. Wound #4 Right,Medial Gluteus Change Dressing every other day. Wound #5 Left Hand - 3rd Digit Change dressing every day. Wound #6 Left,Lateral Lower Leg Change Dressing every other day. Wound #7 Right,Proximal Gluteus Change Dressing every other day. Wound Cleansing May shower and wash wound with soap and water. - on days that dressing is changed Primary Wound Dressing Wound #3 Right,Distal  Gluteus lginate with Silver - pack lightly into wound bed. Calcium A Wound #4 Right,Medial Gluteus Calcium Alginate with Silver Wound #5 Left Hand - 3rd Digit Calcium Alginate with  Silver lginate with Silver - apply neosporin under alginate. Calcium A Wound #6 Left,Lateral Lower Leg Calcium Alginate with Silver Wound #7 Right,Proximal Gluteus Calcium Alginate with Silver Secondary Dressing Wound #3 Right,Distal Gluteus ABD pad - or bordered foam/bordered gauze. Wound #4 Right,Medial Gluteus ABD pad - or bordered foam/bordered gauze. Wound #5 Left Hand - 3rd Digit Kerlix/Rolled Gauze Dry Gauze Wound #6 Left,Lateral Lower Leg ABD pad - or bordered foam/bordered gauze. Wound #7 Right,Proximal Gluteus ABD pad - or bordered foam/bordered gauze. Laboratory erobe culture (MICRO) - culture of the right distal gluteal wound. culture of left hand 3rd digit - Bacteria identified in Unspecified specimen by A (ICD10 L89.310 - Pressure ulcer of right buttock, unstageable) LOINC Code: 361-4 Convenience Name: Areobic culture-specimen not specified Services and Therapies rterial Doppler- Unilateral left arm - [facility] V[facility] to perform Arterial doppler to left arm related to non-healing wound to left 3rd digit. CPT code A ICD 10 code Patient Medications llergies: penicillin, propolis (bee glue), Vioxx A Notifications Medication Indication Start End WOUND INFECTION 08/21/2020 doxycycline monohydrate DOSE oral 100 mg capsule - 1 capsule oral bid for 10 daYS Electronic Signature(s) Signed: 08/21/2020 9:07:41 AM By: Linton Ham MD Entered By: Linton Ham on 08/21/2020 09:07:40 Prescription 08/21/2020 -------------------------------------------------------------------------------- Lezlie Lye MD Patient Name: Provider: 07-08-1956 [id number] Date of Birth: NPI#[place] Sex: DEA #: [phone] [id number] Phone #: License #: [place] Patient Address: [address] [place], [place] [address], [place] [address] [phone] Allergies penicillin; propolis (bee glue); Vioxx Provider's Orders rterial Doppler- Unilateral left arm - Vein and Vascular to perform Arterial doppler to left arm related to non-healing wound to left 3rd digit. CPT code A ICD 10 code Hand Signature: Date(s): Electronic Signature(s) Signed: 08/22/2020 8:12:45 AM By: Linton Ham MD Entered By: Linton Ham on 08/21/2020 09:07:41 -------------------------------------------------------------------------------- Problem List Details Patient Name: Date of Service: Nicholas Sheen EL L. 08/21/2020 7:30 A M Medical Record Number: [id number] Patient Account Number: [id number] Date of Birth/Sex: Treating RN: 10-11-1956 (64 y.o. Lorette Ang, Meta.Reding Primary Care Provider: [name] Other Clinician: Referring Provider: Treating Provider/Extender: Randon Goldsmith in Treatment: 11 Active Problems ICD-10 Encounter Code Description Active Date MDM Diagnosis I87.331 Chronic venous hypertension (idiopathic) with ulcer and inflammation of right 06/05/2020 No Yes lower extremity L89.310 Pressure ulcer of right buttock, unstageable 06/05/2020 No Yes M70.942 Unspecified soft tissue disorder related to use, overuse and pressure, left 08/21/2020 No Yes hand L95.9 Vasculitis limited to the skin, unspecified 06/24/2020 No Yes L97.828 Non-pressure chronic ulcer of other part of left lower leg with other specified 08/21/2020 No Yes severity L02.818 Cutaneous abscess of other sites 08/21/2020 No Yes E11.22 Type 2 diabetes mellitus with diabetic chronic kidney disease 06/05/2020 No Yes Inactive Problems ICD-10 Code Description Active Date Inactive Date L97.212 Non-pressure chronic ulcer of right calf with fat layer exposed 06/05/2020 06/05/2020 Resolved Problems Electronic Signature(s) Signed:  08/22/2020 8:12:45 AM By: Linton Ham MD Entered By: Linton Ham on 08/21/2020 09:01:38 -------------------------------------------------------------------------------- Progress Note Details Patient Name: Date of Service: Nicholas Sheen EL L. 08/21/2020 7:30 A M Medical Record Number: [id number] Patient Account Number: [id number] Date of Birth/Sex: Treating RN: 1956/03/25 (64 y.o. [name] Primary Care Provider: [name] Other Clinician: Referring Provider: Treating Provider/Extender: Randon Goldsmith in Treatment: 11 Subjective History of Present Illness (HPI) ADMISSION 06/05/2020 This is a 64 year old man who has been on dialysis secondary  to diabetes for about 5 years. He was recently seen by Dr. Amalia Hailey I think for routine and prophylactic nail care and foot care. He mentioned that he had in this area on the right lateral leg and he was referred here for review of this. He has been using gentamicin cream on the wound. He also uses a cam boot for Charcot ankle. He tells me that he does not have a wound on his left foot or ankle. Almost as a side he mentioned that he had a wound on the right gluteal area. He states that this wound had been present since he was hospitalized on 2 different occasions in March. Firstly from 2/28 through 3/6 for Covid and strep bacteremia. He was rehospitalized from 3/9 through 3/12 secondary to hypotension and hypothermia. It was notable during that admission that he had a wound on the right lateral leg they did not mention the area on the right gluteal. In any case the patient is using gentamicin on this area as well his wife changes the dressing. Past medical history; end-stage renal disease on dialysis secondary to type 2 diabetes, hypertension, hyperlipidemia Charcot ankle on the left foot and ankle, he is an ex-smoker 10 years ago remote history of hepatitis C and congestive heart failure. The patient had vascular studies  on 05/01/2020 this showed ABIs on the right at 2.06 but with triphasic and biphasic waveforms his TBI was actually elevated at 1.25. He did not allow the cam boot to be removed and therefore arterial studies on the left were not done 8/10-Patient returns to clinic has continued discomfort in the right gluteal ischial area where he has a wound and less discomfort the right lateral leg wound. We have been using Santyl to these areas. He was biopsied at last visit and the results indicate inflamed granulation tissue and vasculopathy-the latter seems consistent with calciphylaxis and given the location of wound not clearly pressure etiology this may well be the case 8/17; I have reviewed the pathology report from the punch biopsies I did a few weeks ago. Calciphylaxis was not seen in these sections there was fibrinoid necrosis at the edges. They suggested clinical correlation and/or additional punch biopsies of calciphylaxis is strongly suspected. We are using Santyl to the area on the right buttock. As well as an area on the right leg which I think is a venous insufficiency ulcer, we have been using silver alginate and this is just about healed 8/31; 2-week follow-up. I spoke to his nephrologist after his visit 2 weeks ago we both agreed that thiosulfate at least on a trial basis might be indicated. Although we did not have biopsy-proven calciphylaxis this is certainly clinically what this is. However, apparently the patient states that they have not yet started the thiosulfate at dialysis. He is dialyzing at the [facility]. We have been using Santyl to the wound surface. He has 3 wounds on the right buttock. I think 2 of these have formed since the last time he was here. These were the original satellite lesions I describe last time. the 2 new areas have a completely necrotic surface the other looks somewhat better. The induration and firmness around this area seems somewhat better today. 9/14;  2-week follow-up. He has 3 wounds on the right buttock to the original wound and 2 satellite late lesions. The satellite lesions expanded but have a clean base the original wound is necrotic. He just started on the thiosulfate within the last 2 dialysis sessions. Apparently  his nephrologist wants to speak to me about how long to run this. The other worrisome thing here is skin breakdown around the original wound. This looks somewhat angry with a small area loss of epithelialization. Almost looks like a contact dermatitis. 10/7; the patient has not been here in 3 weeks. He still has 3 areas to the right buttock the original wound is now split into 2 and a new area. This is the area I thought was calciphylaxis he apparently was receiving thiosulfate at dialysis in [place]. He arrives with an increasing pain over this area He also has 2 new areas on the left lateral upper leg and a necrotic wound on the tip of the left mid finger. He tells me that finger wound has been there for about a month although he has not mentioned it in a body. He thinks he was bitten by something Objective Constitutional Blood pressure is low ever V he looks systemically well. Pulse regular and within target range for patient.Marland Kitchen Respirations regular, non-labored and within target range.. Temperature is normal and within the target range for the patient.Marland Kitchen Appears in no distress. Vitals Time Taken: 7:53 AM, Height: 73 in, Weight: 250 lbs, BMI: 33, Temperature: 98.1 F, Pulse: 90 bpm, Respiratory Rate: 19 breaths/min, Blood Pressure: 99/66 mmHg. General Notes: Wound exam; ooRight ischial wound healed. There are 2 wound areas here which I think are remanence of of the original large wound. He has a new wound more medially. Out of the distal part of the original wound there is a copious amounts of purulent drainage. This was cultured. ooOn the left lateral upper lower leg a necrotic surface removed with a #5 curette. I am  not certain of the etiology of this. ooOn the left plantar mid finger another necrotic wound that he says has been there for at least a month. This had necrotic material coming out of his finger. I removed this with pickups and a #15 scalpel and then cultured the underlying tissue. This is a deep wound ooI can feel a brachial pulse on the left but I cannot feel his radial pulse. His fingers are cool but not cold Integumentary (Hair, Skin) Wound #3 status is Open. Original cause of wound was Gradually Appeared. The wound is located on the Right,Distal Gluteus. The wound measures 1.4cm length x 1.5cm width x 1.3cm depth; 1.649cm^2 area and 2.144cm^3 volume. There is Fat Layer (Subcutaneous Tissue) exposed. There is no tunneling or undermining noted. There is a large amount of purulent drainage noted. The wound margin is well defined and not attached to the wound base. There is medium (34-66%) red, pink granulation within the wound bed. There is a medium (34-66%) amount of necrotic tissue within the wound bed including Adherent Slough. Wound #4 status is Open. Original cause of wound was Gradually Appeared. The wound is located on the Right,Medial Gluteus. The wound measures 0.3cm length x 0.4cm width x 0.1cm depth; 0.094cm^2 area and 0.009cm^3 volume. There is Fat Layer (Subcutaneous Tissue) exposed. There is no tunneling or undermining noted. There is a medium amount of purulent drainage noted. The wound margin is distinct with the outline attached to the wound base. There is medium (34-66%) pink granulation within the wound bed. There is a medium (34-66%) amount of necrotic tissue within the wound bed including Adherent Slough. Wound #5 status is Open. Original cause of wound was Bite. The wound is located on the Left Hand - 3rd Digit. The wound measures 1.5cm length x 1.1cm width  x 0.2cm depth; 1.296cm^2 area and 0.259cm^3 volume. There is bone and Fat Layer (Subcutaneous Tissue) exposed. There is no  tunneling or undermining noted. There is a small amount of serous drainage noted. The wound margin is well defined and not attached to the wound base. There is medium (34-66%) pink granulation within the wound bed. There is a medium (34-66%) amount of necrotic tissue within the wound bed including Adherent Slough. Wound #6 status is Open. Original cause of wound was Gradually Appeared. The wound is located on the Left,Lateral Lower Leg. The wound measures 1cm length x 1cm width x 0.1cm depth; 0.785cm^2 area and 0.079cm^3 volume. There is Fat Layer (Subcutaneous Tissue) exposed. There is no tunneling or undermining noted. There is a none present amount of drainage noted. The wound margin is distinct with the outline attached to the wound base. There is no granulation within the wound bed. There is a large (67-100%) amount of necrotic tissue within the wound bed including Eschar. Wound #7 status is Open. Original cause of wound was Gradually Appeared. The wound is located on the Right,Proximal Gluteus. The wound measures 2.2cm length x 3cm width x 0.7cm depth; 5.184cm^2 area and 3.629cm^3 volume. There is Fat Layer (Subcutaneous Tissue) exposed. There is no tunneling or undermining noted. There is a medium amount of purulent drainage noted. The wound margin is distinct with the outline attached to the wound base. There is medium (34-66%) red, pink granulation within the wound bed. There is a medium (34-66%) amount of necrotic tissue within the wound bed including Adherent Slough. Assessment Active Problems ICD-10 Chronic venous hypertension (idiopathic) with ulcer and inflammation of right lower extremity Pressure ulcer of right buttock, unstageable Unspecified soft tissue disorder related to use, overuse and pressure, left hand Vasculitis limited to the skin, unspecified Non-pressure chronic ulcer of other part of left lower leg with other specified severity Cutaneous abscess of other sites Type  2 diabetes mellitus with diabetic chronic kidney disease Procedures Wound #5 Pre-procedure diagnosis of Wound #5 is a T be determined located on the Left Hand - 3rd Digit . There was a Selective/Open Wound Non-Viable Tissue o Debridement with a total area of 1.65 sq cm performed by Ricard Dillon., MD. With the following instrument(s): Blade, and Forceps to remove Non-Viable tissue/material. Material removed includes Eschar after achieving pain control using Lidocaine 4% T opical Solution. 1 specimen was taken by a Swab and sent to the lab per facility protocol. A time out was conducted at 08:50, prior to the start of the procedure. A Minimum amount of bleeding was controlled with Pressure. The procedure was tolerated well with a pain level of 0 throughout and a pain level of 2 following the procedure. Post Debridement Measurements: 1.5cm length x 1.1cm width x 0.2cm depth; 0.259cm^3 volume. Character of Wound/Ulcer Post Debridement is improved. Post procedure Diagnosis Wound #5: Same as Pre-Procedure Wound #6 Pre-procedure diagnosis of Wound #6 is a Venous Leg Ulcer located on the Left,Lateral Lower Leg .Severity of Tissue Pre Debridement is: Fat layer exposed. There was a Selective/Open Wound Non-Viable Tissue Debridement with a total area of 1 sq cm performed by Ricard Dillon., MD. With the following instrument(s): Curette to remove Non-Viable tissue/material. Material removed includes Eschar after achieving pain control using Lidocaine 4% Topical Solution. A time out was conducted at 08:50, prior to the start of the procedure. A Minimum amount of bleeding was controlled with Pressure. The procedure was tolerated well with a pain level of 0 throughout  and a pain level of 2 following the procedure. Post Debridement Measurements: 1cm length x 1cm width x 0.1cm depth; 0.079cm^3 volume. Character of Wound/Ulcer Post Debridement is improved. Severity of Tissue Post Debridement is: Fat layer  exposed. Post procedure Diagnosis Wound #6: Same as Pre-Procedure Plan Follow-up Appointments: Return Appointment in 1 week. Dressing Change Frequency: Wound #3 Right,Distal Gluteus: Change Dressing every other day. Wound #4 Right,Medial Gluteus: Change Dressing every other day. Wound #5 Left Hand - 3rd Digit: Change dressing every day. Wound #6 Left,Lateral Lower Leg: Change Dressing every other day. Wound #7 Right,Proximal Gluteus: Change Dressing every other day. Wound Cleansing: May shower and wash wound with soap and water. - on days that dressing is changed Primary Wound Dressing: Wound #3 Right,Distal Gluteus: Calcium Alginate with Silver - pack lightly into wound bed. Wound #4 Right,Medial Gluteus: Calcium Alginate with Silver Wound #5 Left Hand - 3rd Digit: Calcium Alginate with Silver Calcium Alginate with Silver - apply neosporin under alginate. Wound #6 Left,Lateral Lower Leg: Calcium Alginate with Silver Wound #7 Right,Proximal Gluteus: Calcium Alginate with Silver Secondary Dressing: Wound #3 Right,Distal Gluteus: ABD pad - or bordered foam/bordered gauze. Wound #4 Right,Medial Gluteus: ABD pad - or bordered foam/bordered gauze. Wound #5 Left Hand - 3rd Digit: Kerlix/Rolled Gauze Dry Gauze Wound #6 Left,Lateral Lower Leg: ABD pad - or bordered foam/bordered gauze. Wound #7 Right,Proximal Gluteus: ABD pad - or bordered foam/bordered gauze. Laboratory ordered were: Areobic culture-specimen not specified - culture of the right distal gluteal wound. culture of left hand 3rd digit Services and Therapies ordered were: Arterial Doppler- Unilateral left arm - [facility] to perform Arterial doppler to left arm related to non-healing wound to left 3rd digit. CPT code ICD 10 code The following medication(s) was prescribed: doxycycline monohydrate oral 100 mg capsule 1 capsule oral bid for 10 daYS for WOUND INFECTION starting 08/21/2020 1. There is clearly an  infection in the right buttock. We cultured the purulent drainage I have empirically added doxycycline 100 twice daily. I have asked him to say something to his nephrologist in [place]. 2. He has a necrotic wound on the plantar aspect of his left third finger which was also new today. He does not have a radial pulse will order arterial Dopplers. This was aggressively debrided of necrotic material and then it was cultured. 3. 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 As far as I am aware he is still on thiosulfate at dialysis however at least the lower 1 of these is infected there is an abscess. I have empirically added doxycycline. He might need IV antibiotics Electronic Signature(s) Signed: 08/22/2020 8:12:45 AM By: Linton Ham MD Entered By: Linton Ham on 08/21/2020 09:09:31 -------------------------------------------------------------------------------- SuperBill Details Patient Name: Date of Service: [name] 08/21/2020 Medical Record Number: [id number] Patient Account Number: [id number] Date of Birth/Sex: Treating RN: 1956-08-01 (64 y.o. Lorette Ang, Meta.Reding Primary Care Provider: [name] Other Clinician: Referring Provider: Treating Provider/Extender: Randon Goldsmith in Treatment: 11 Diagnosis Coding ICD-10 Codes Code Description [phone] Chronic venous hypertension (idiopathic) with ulcer and inflammation of right lower extremity L89.310 Pressure ulcer of right buttock, unstageable M70.942 Unspecified soft tissue disorder related to use, overuse and pressure, left hand L95.9 Vasculitis limited to the skin, unspecified L97.828 Non-pressure chronic ulcer of other part of left lower leg with other specified severity L02.818 Cutaneous abscess of other sites E11.22 Type 2 diabetes mellitus with diabetic chronic kidney disease Facility Procedures CPT4  Code: 22979892 Description: 11941 - DEB SUBQ TISSUE 20  SQ CM/< ICD-10 Diagnosis Description L97.828 Non-pressure chronic ulcer of other part of left lower leg with other specified M70.942 Unspecified soft tissue disorder related to use, overuse and pressure, left hand Modifier: severity Quantity: 1 Physician Procedures : CPT4 Code Description Modifier [id number] 99214 - WC PHYS LEVEL 4 - EST PT 25 ICD-10 Diagnosis Description M70.942 Unspecified soft tissue disorder related to use, overuse and pressure, left hand L97.828 Non-pressure chronic ulcer of other part of left  lower leg with other specified severity L02.818 Cutaneous abscess of other sites Quantity: 1 : [id number] 11042 - WC PHYS SUBQ TISS 20 SQ CM ICD-10 Diagnosis Description L97.828 Non-pressure chronic ulcer of other part of left lower leg with other specified severity M70.942 Unspecified soft tissue disorder related to use, overuse and pressure, left  hand Quantity: 1 Electronic Signature(s) Signed: 08/22/2020 8:12:45 AM By: Linton Ham MD Entered By: Linton Ham on 08/21/2020 09:10:38

## 2020-08-26 ENCOUNTER — Other Ambulatory Visit: Payer: Self-pay

## 2020-08-26 ENCOUNTER — Encounter (HOSPITAL_BASED_OUTPATIENT_CLINIC_OR_DEPARTMENT_OTHER): Payer: Medicare (Managed Care) | Admitting: Internal Medicine

## 2020-08-26 ENCOUNTER — Ambulatory Visit (HOSPITAL_COMMUNITY)
Admission: RE | Admit: 2020-08-26 | Discharge: 2020-08-26 | Disposition: A | Discharge status: Home / Self Care | Payer: Medicare (Managed Care) | Source: Ambulatory Visit | Attending: Vascular Surgery | Admitting: Vascular Surgery

## 2020-08-26 DIAGNOSIS — S41102A Unspecified open wound of left upper arm, initial encounter: Secondary | ICD-10-CM | POA: Insufficient documentation

## 2020-08-26 LAB — AEROBIC CULTURE W GRAM STAIN (SUPERFICIAL SPECIMEN): Gram Stain: NONE SEEN

## 2020-08-28 ENCOUNTER — Encounter (HOSPITAL_BASED_OUTPATIENT_CLINIC_OR_DEPARTMENT_OTHER): Payer: Medicare (Managed Care) | Admitting: Internal Medicine

## 2020-08-28 DIAGNOSIS — Z1211 Encounter for screening for malignant neoplasm of colon: Secondary | ICD-10-CM | POA: Insufficient documentation

## 2020-08-28 DIAGNOSIS — R932 Abnormal findings on diagnostic imaging of liver and biliary tract: Secondary | ICD-10-CM | POA: Insufficient documentation

## 2020-09-16 ENCOUNTER — Encounter (HOSPITAL_BASED_OUTPATIENT_CLINIC_OR_DEPARTMENT_OTHER): Payer: Medicare (Managed Care) | Attending: Internal Medicine | Admitting: Internal Medicine

## 2020-09-16 ENCOUNTER — Other Ambulatory Visit: Payer: Self-pay

## 2020-09-16 ENCOUNTER — Encounter: Payer: Self-pay | Admitting: Podiatry

## 2020-09-16 ENCOUNTER — Ambulatory Visit (INDEPENDENT_AMBULATORY_CARE_PROVIDER_SITE_OTHER): Payer: Medicare (Managed Care) | Admitting: Podiatry

## 2020-09-16 DIAGNOSIS — E11621 Type 2 diabetes mellitus with foot ulcer: Secondary | ICD-10-CM | POA: Insufficient documentation

## 2020-09-16 DIAGNOSIS — E1140 Type 2 diabetes mellitus with diabetic neuropathy, unspecified: Secondary | ICD-10-CM | POA: Diagnosis not present

## 2020-09-16 DIAGNOSIS — I87331 Chronic venous hypertension (idiopathic) with ulcer and inflammation of right lower extremity: Secondary | ICD-10-CM | POA: Insufficient documentation

## 2020-09-16 DIAGNOSIS — E0843 Diabetes mellitus due to underlying condition with diabetic autonomic (poly)neuropathy: Secondary | ICD-10-CM | POA: Diagnosis not present

## 2020-09-16 DIAGNOSIS — E1142 Type 2 diabetes mellitus with diabetic polyneuropathy: Secondary | ICD-10-CM

## 2020-09-16 DIAGNOSIS — I509 Heart failure, unspecified: Secondary | ICD-10-CM | POA: Diagnosis not present

## 2020-09-16 DIAGNOSIS — L89310 Pressure ulcer of right buttock, unstageable: Secondary | ICD-10-CM | POA: Insufficient documentation

## 2020-09-16 DIAGNOSIS — N186 End stage renal disease: Secondary | ICD-10-CM | POA: Diagnosis not present

## 2020-09-16 DIAGNOSIS — L959 Vasculitis limited to the skin, unspecified: Secondary | ICD-10-CM | POA: Insufficient documentation

## 2020-09-16 DIAGNOSIS — B351 Tinea unguium: Secondary | ICD-10-CM

## 2020-09-16 DIAGNOSIS — E1122 Type 2 diabetes mellitus with diabetic chronic kidney disease: Secondary | ICD-10-CM | POA: Diagnosis not present

## 2020-09-16 DIAGNOSIS — L98492 Non-pressure chronic ulcer of skin of other sites with fat layer exposed: Secondary | ICD-10-CM | POA: Diagnosis not present

## 2020-09-16 DIAGNOSIS — L02818 Cutaneous abscess of other sites: Secondary | ICD-10-CM | POA: Diagnosis not present

## 2020-09-16 DIAGNOSIS — L98412 Non-pressure chronic ulcer of buttock with fat layer exposed: Secondary | ICD-10-CM | POA: Diagnosis not present

## 2020-09-16 DIAGNOSIS — M14672 Charcot's joint, left ankle and foot: Secondary | ICD-10-CM

## 2020-09-16 DIAGNOSIS — I132 Hypertensive heart and chronic kidney disease with heart failure and with stage 5 chronic kidney disease, or end stage renal disease: Secondary | ICD-10-CM | POA: Diagnosis not present

## 2020-09-16 DIAGNOSIS — Z87891 Personal history of nicotine dependence: Secondary | ICD-10-CM | POA: Diagnosis not present

## 2020-09-16 DIAGNOSIS — Z992 Dependence on renal dialysis: Secondary | ICD-10-CM | POA: Insufficient documentation

## 2020-09-16 DIAGNOSIS — M70942 Unspecified soft tissue disorder related to use, overuse and pressure, left hand: Secondary | ICD-10-CM | POA: Diagnosis not present

## 2020-09-16 NOTE — Progress Notes (Signed)
  Subjective:  Patient ID: [name], male    DOB: Sep 27, 1956,  MRN: [id number]  Chief Complaint  Patient presents with  . Foot Pain    PT stated that left foot is not any better and he would like to get out of the boot so he can return to work     65 y.o. male presents with the above complaint. History confirmed with patient.  He has been in a CROW for nearly 2 years now for left ankle Charcot.  He would like to get out of the boot and not wear it anymore where something smaller.  He feels like this 1 needs to be refurbished replaced because it is getting old.  Objective:  Physical Exam: Onychomycosis x10 nails.  He has weakly palpable pulses in the tips of the toes are cool to touch with thin shiny atrophic skin present.  The left ankle is in a valgus position and has deformity.  There is no impending skin breakdown or ulceration. Assessment:   1. Diabetes mellitus due to underlying condition with diabetic autonomic neuropathy, unspecified whether long term insulin use ([place])   2. Diabetic peripheral neuropathy (HCC)   3. Charcot ankle, left   4. Onychomycosis      Plan:  Patient was evaluated and treated and all questions answered.  Patient educated on diabetes. Discussed proper diabetic foot care and discussed risks and complications of disease. Educated patient in depth on reasons to return to the office immediately should he/she discover anything concerning or new on the feet. All questions answered. Discussed proper shoes as well.   Discussed the etiology and treatment options for the condition in detail with the patient. Educated patient on the topical and oral treatment options for mycotic nails. Recommended debridement of the nails today. Sharp and mechanical debridement performed of all painful and mycotic nails today. Nails debrided in length and thickness using a nail nipper and a mechanical burr to level of comfort. Discussed treatment options including appropriate shoe  gear. Follow up as needed for painful nails.   I discussed with him that he has severe ankle Charcot arthropathy and he should continue to wear the CROW boot to allow to consolidate completely.  I do not think he requires reconstruction as time, nor would he be a great candidate for it at this time.  I discussed with him the etiology of Charcot as well as the eventual outcomes and that most patients can lead to limb loss.  I also discussed with him that amputation is a viable alternative to wearing the Charcot boot but he would prefer not to this.  I wrote him a new prescription for the CROW and he will obtain this from [facility]  Return in about 3 months (around 12/17/2020) for [facility].

## 2020-09-17 NOTE — Progress Notes (Signed)
BENJERMAN, MOLINELLI ([id number]) Visit Report for 09/16/2020 HPI Details Patient Name: Date of Service: Nicholas, Duran 09/16/2020 8:00 A M Medical Record Number: [id number] Patient Account Number: [id number] Date of Birth/Sex: Treating RN: Nov 01, 1956 (64 y.o. [name] Primary Care Provider: [name] Other Clinician: Referring Provider: Treating Provider/Extender: Randon Goldsmith in Treatment: 14 History of Present Illness HPI Description: ADMISSION 06/05/2020 This is a 64 year old man who has been on dialysis secondary to diabetes for about 5 years. He was recently seen by Dr. Amalia Hailey I think for routine and prophylactic nail care and foot care. He mentioned that he had in this area on the right lateral leg and he was referred here for review of this. He has been using gentamicin cream on the wound. He also uses a cam boot for Charcot ankle. He tells me that he does not have a wound on his left foot or ankle. Almost as a side he mentioned that he had a wound on the right gluteal area. He states that this wound had been present since he was hospitalized on 2 different occasions in March. Firstly from 2/28 through 3/6 for Covid and strep bacteremia. He was rehospitalized from 3/9 through 3/12 secondary to hypotension and hypothermia. It was notable during that admission that he had a wound on the right lateral leg they did not mention the area on the right gluteal. In any case the patient is using gentamicin on this area as well his wife changes the dressing. Past medical history; end-stage renal disease on dialysis secondary to type 2 diabetes, hypertension, hyperlipidemia Charcot ankle on the left foot and ankle, he is an ex-smoker 10 years ago remote history of hepatitis C and congestive heart failure. The patient had vascular studies on 05/01/2020 this showed ABIs on the right at 2.06 but with triphasic and biphasic waveforms his TBI was actually elevated at 1.25.  He did not allow the cam boot to be removed and therefore arterial studies on the left were not done 8/10-Patient returns to clinic has continued discomfort in the right gluteal ischial area where he has a wound and less discomfort the right lateral leg wound. We have been using Santyl to these areas. He was biopsied at last visit and the results indicate inflamed granulation tissue and vasculopathy-the latter seems consistent with calciphylaxis and given the location of wound not clearly pressure etiology this may well be the case 8/17; I have reviewed the pathology report from the punch biopsies I did a few weeks ago. Calciphylaxis was not seen in these sections there was fibrinoid necrosis at the edges. They suggested clinical correlation and/or additional punch biopsies of calciphylaxis is strongly suspected. We are using Santyl to the area on the right buttock. As well as an area on the right leg which I think is a venous insufficiency ulcer, we have been using silver alginate and this is just about healed 8/31; 2-week follow-up. I spoke to his nephrologist after his visit 2 weeks ago we both agreed that thiosulfate at least on a trial basis might be indicated. Although we did not have biopsy-proven calciphylaxis this is certainly clinically what this is. However, apparently the patient states that they have not yet started the thiosulfate at dialysis. He is dialyzing at the [facility]. We have been using Santyl to the wound surface. He has 3 wounds on the right buttock. I think 2 of these have formed since the last time he was here. These were the  original satellite lesions I describe last time. the 2 new areas have a completely necrotic surface the other looks somewhat better. The induration and firmness around this area seems somewhat better today. 9/14; 2-week follow-up. He has 3 wounds on the right buttock to the original wound and 2 satellite late lesions. The satellite lesions expanded  but have a clean base the original wound is necrotic. He just started on the thiosulfate within the last 2 dialysis sessions. Apparently his nephrologist wants to speak to me about how long to run this. The other worrisome thing here is skin breakdown around the original wound. This looks somewhat angry with a small area loss of epithelialization. Almost looks like a contact dermatitis. 10/7; the patient has not been here in 3 weeks. He still has 3 areas to the right buttock the original wound is now split into 2 and a new area. This is the area I thought was calciphylaxis he apparently was receiving thiosulfate at dialysis in [place]. He arrives with an increasing pain over this area He also has 2 new areas on the left lateral upper leg and a necrotic wound on the tip of the left mid finger. He tells me that finger wound has been there for about a month although he has not mentioned it in a body. He thinks he was bitten by something 11/2; the patient continues on thiosulfate at dialysis. The areas on his right buttock are considerably better today. I am more convinced than ever that this was calciphylaxis. The last time he was here he had a new wound on the left third finger as well as purulence on the right buttock. The right buttock cultured Klebsiella Proteus and Staph aureus methicillin sensitive, the finger cultured Proteus Morganella and methicillin sensitive staph aureus all sensitive to quinolones I gave him Levaquin postdialysis. This seems to have been satisfactory. His wounds on his legs are healed. The area on the right buttock considerably better. 2 wounds just about healed. He has an area on the left third finger still a small opening but this is considerably better than last time The vascular ultrasound I did of the left upper extremity was normal with triphasic waveforms extending throughout the radial and ulnar arteries extending into the palmar arch. He does not have a vascular  issue in his upper extremity. He has never had a shunt in his left arm Electronic Signature(s) Signed: 09/16/2020 4:57:00 PM By: Linton Ham MD Entered By: Linton Ham on 09/16/2020 08:40:32 -------------------------------------------------------------------------------- Physical Exam Details Patient Name: Date of Service: [name] EL L. 09/16/2020 8:00 A M Medical Record Number: [id number] Patient Account Number: [id number] Date of Birth/Sex: Treating RN: Apr 27, 1956 (64 y.o. [name] Primary Care Provider: [name] Other Clinician: Referring Provider: Treating Provider/Extender: Randon Goldsmith in Treatment: 14 Constitutional Sitting or standing Blood Pressure is within target range for patient.. Pulse regular and within target range for patient.Marland Kitchen Respirations regular, non-labored and within target range.. Temperature is normal and within the target range for the patient.Marland Kitchen Appears in no distress. Cardiovascular I still do not feel a radial pulse on the left brachial pulses palpable. Notes Wound exam Wounds over his right ischium still 2 remaining areas. Both of these are just about healed. Surrounding tissue still looks somewhat different to me mildly tender certainly not abruptly tender. The areas on his left lateral and right lateral lower extremities are healed. Left third finger plantar aspect still a small linear area. The None of this requires  debridement Electronic Signature(s) Signed: 09/16/2020 4:57:00 PM By: Linton Ham MD Entered By: Linton Ham on 09/16/2020 08:41:53 -------------------------------------------------------------------------------- Physician Orders Details Patient Name: Date of Service: [name] EL L. 09/16/2020 8:00 A M Medical Record Number: [id number] Patient Account Number: [id number] Date of Birth/Sex: Treating RN: 05-Jun-1956 (64 y.o. [name] Primary Care Provider: [name] Other  Clinician: Referring Provider: Treating Provider/Extender: Randon Goldsmith in Treatment: 14 Verbal / Phone Orders: No Diagnosis Coding ICD-10 Coding Code Description I87.331 Chronic venous hypertension (idiopathic) with ulcer and inflammation of right lower extremity L89.310 Pressure ulcer of right buttock, unstageable M70.942 Unspecified soft tissue disorder related to use, overuse and pressure, left hand L95.9 Vasculitis limited to the skin, unspecified L97.828 Non-pressure chronic ulcer of other part of left lower leg with other specified severity L02.818 Cutaneous abscess of other sites E11.22 Type 2 diabetes mellitus with diabetic chronic kidney disease Follow-up Appointments Return Appointment in 2 weeks. Dressing Change Frequency Wound #3 Right,Distal Gluteus Change Dressing every other day. Wound #5 Left Hand - 3rd Digit Change dressing every day. Wound #7 Right,Proximal Gluteus Change Dressing every other day. Wound Cleansing May shower and wash wound with soap and water. - on days that dressing is changed Primary Wound Dressing Wound #3 Right,Distal Gluteus lginate with Silver - pack lightly into wound bed. Calcium A Wound #5 Left Hand - 3rd Digit Calcium Alginate with Silver lginate with Silver - apply neosporin under alginate. Calcium A Wound #7 Right,Proximal Gluteus Calcium Alginate with Silver Secondary Dressing Wound #3 Right,Distal Gluteus ABD pad - or bordered foam/bordered gauze. Wound #5 Left Hand - 3rd Digit Kerlix/Rolled Gauze Dry Gauze Wound #7 Right,Proximal Gluteus ABD pad - or bordered foam/bordered gauze. Electronic Signature(s) Signed: 09/16/2020 4:57:00 PM By: Linton Ham MD Signed: 09/17/2020 9:08:43 AM By: Carlene Coria RN Entered By: Carlene Coria on 09/16/2020 08:31:53 -------------------------------------------------------------------------------- Problem List Details Patient Name: Date of Service: [name]  EL L. 09/16/2020 8:00 A M Medical Record Number: [id number] Patient Account Number: [id number] Date of Birth/Sex: Treating RN: 1956-04-19 (64 y.o. [name] Primary Care Provider: [name] Other Clinician: Referring Provider: Treating Provider/Extender: Randon Goldsmith in Treatment: 14 Active Problems ICD-10 Encounter Code Description Active Date MDM Diagnosis I87.331 Chronic venous hypertension (idiopathic) with ulcer and inflammation of right 06/05/2020 No Yes lower extremity L89.310 Pressure ulcer of right buttock, unstageable 06/05/2020 No Yes M70.942 Unspecified soft tissue disorder related to use, overuse and pressure, left 08/21/2020 No Yes hand L95.9 Vasculitis limited to the skin, unspecified 06/24/2020 No Yes L97.828 Non-pressure chronic ulcer of other part of left lower leg with other specified 08/21/2020 No Yes severity L02.818 Cutaneous abscess of other sites 08/21/2020 No Yes E11.22 Type 2 diabetes mellitus with diabetic chronic kidney disease 06/05/2020 No Yes Inactive Problems ICD-10 Code Description Active Date Inactive Date L97.212 Non-pressure chronic ulcer of right calf with fat layer exposed 06/05/2020 06/05/2020 Resolved Problems Electronic Signature(s) Signed: 09/16/2020 4:57:00 PM By: Linton Ham MD Entered By: Linton Ham on 09/16/2020 08:37:41 -------------------------------------------------------------------------------- Progress Note Details Patient Name: Date of Service: [name] EL L. 09/16/2020 8:00 A M Medical Record Number: [id number] Patient Account Number: [id number] Date of Birth/Sex: Treating RN: March 06, 1956 (64 y.o. [name] Primary Care Provider: [name] Other Clinician: Referring Provider: Treating Provider/Extender: Randon Goldsmith in Treatment: 14 Subjective History of Present Illness (HPI) ADMISSION 06/05/2020 This is a 64 year old man who has been on dialysis  secondary to diabetes for about 5 years.  He was recently seen by Dr. Amalia Hailey I think for routine and prophylactic nail care and foot care. He mentioned that he had in this area on the right lateral leg and he was referred here for review of this. He has been using gentamicin cream on the wound. He also uses a cam boot for Charcot ankle. He tells me that he does not have a wound on his left foot or ankle. Almost as a side he mentioned that he had a wound on the right gluteal area. He states that this wound had been present since he was hospitalized on 2 different occasions in March. Firstly from 2/28 through 3/6 for Covid and strep bacteremia. He was rehospitalized from 3/9 through 3/12 secondary to hypotension and hypothermia. It was notable during that admission that he had a wound on the right lateral leg they did not mention the area on the right gluteal. In any case the patient is using gentamicin on this area as well his wife changes the dressing. Past medical history; end-stage renal disease on dialysis secondary to type 2 diabetes, hypertension, hyperlipidemia Charcot ankle on the left foot and ankle, he is an ex-smoker 10 years ago remote history of hepatitis C and congestive heart failure. The patient had vascular studies on 05/01/2020 this showed ABIs on the right at 2.06 but with triphasic and biphasic waveforms his TBI was actually elevated at 1.25. He did not allow the cam boot to be removed and therefore arterial studies on the left were not done 8/10-Patient returns to clinic has continued discomfort in the right gluteal ischial area where he has a wound and less discomfort the right lateral leg wound. We have been using Santyl to these areas. He was biopsied at last visit and the results indicate inflamed granulation tissue and vasculopathy-the latter seems consistent with calciphylaxis and given the location of wound not clearly pressure etiology this may well be the case 8/17; I have  reviewed the pathology report from the punch biopsies I did a few weeks ago. Calciphylaxis was not seen in these sections there was fibrinoid necrosis at the edges. They suggested clinical correlation and/or additional punch biopsies of calciphylaxis is strongly suspected. We are using Santyl to the area on the right buttock. As well as an area on the right leg which I think is a venous insufficiency ulcer, we have been using silver alginate and this is just about healed 8/31; 2-week follow-up. I spoke to his nephrologist after his visit 2 weeks ago we both agreed that thiosulfate at least on a trial basis might be indicated. Although we did not have biopsy-proven calciphylaxis this is certainly clinically what this is. However, apparently the patient states that they have not yet started the thiosulfate at dialysis. He is dialyzing at the [facility]. We have been using Santyl to the wound surface. He has 3 wounds on the right buttock. I think 2 of these have formed since the last time he was here. These were the original satellite lesions I describe last time. the 2 new areas have a completely necrotic surface the other looks somewhat better. The induration and firmness around this area seems somewhat better today. 9/14; 2-week follow-up. He has 3 wounds on the right buttock to the original wound and 2 satellite late lesions. The satellite lesions expanded but have a clean base the original wound is necrotic. He just started on the thiosulfate within the last 2 dialysis sessions. Apparently his nephrologist wants to speak to me  about how long to run this. The other worrisome thing here is skin breakdown around the original wound. This looks somewhat angry with a small area loss of epithelialization. Almost looks like a contact dermatitis. 10/7; the patient has not been here in 3 weeks. He still has 3 areas to the right buttock the original wound is now split into 2 and a new area. This is the  area I thought was calciphylaxis he apparently was receiving thiosulfate at dialysis in [place]. He arrives with an increasing pain over this area He also has 2 new areas on the left lateral upper leg and a necrotic wound on the tip of the left mid finger. He tells me that finger wound has been there for about a month although he has not mentioned it in a body. He thinks he was bitten by something 11/2; the patient continues on thiosulfate at dialysis. The areas on his right buttock are considerably better today. I am more convinced than ever that this was calciphylaxis. The last time he was here he had a new wound on the left third finger as well as purulence on the right buttock. The right buttock cultured Klebsiella Proteus and Staph aureus methicillin sensitive, the finger cultured Proteus Morganella and methicillin sensitive staph aureus all sensitive to quinolones I gave him Levaquin postdialysis. This seems to have been satisfactory. His wounds on his legs are healed. The area on the right buttock considerably better. 2 wounds just about healed. He has an area on the left third finger still a small opening but this is considerably better than last time The vascular ultrasound I did of the left upper extremity was normal with triphasic waveforms extending throughout the radial and ulnar arteries extending into the palmar arch. He does not have a vascular issue in his upper extremity. He has never had a shunt in his left arm Objective Constitutional Sitting or standing Blood Pressure is within target range for patient.. Pulse regular and within target range for patient.Marland Kitchen Respirations regular, non-labored and within target range.. Temperature is normal and within the target range for the patient.Marland Kitchen Appears in no distress. Vitals Time Taken: 7:58 AM, Height: 73 in, Weight: 250 lbs, BMI: 33, Temperature: 98.4 F, Pulse: 83 bpm, Respiratory Rate: 18 breaths/min, Blood Pressure: 119/84  mmHg. Cardiovascular I still do not feel a radial pulse on the left brachial pulses palpable. General Notes: Wound exam ooWounds over his right ischium still 2 remaining areas. Both of these are just about healed. Surrounding tissue still looks somewhat different to me mildly tender certainly not abruptly tender. ooThe areas on his left lateral and right lateral lower extremities are healed. ooLeft third finger plantar aspect still a small linear area. The ooNone of this requires debridement Integumentary (Hair, Skin) Wound #3 status is Open. Original cause of wound was Gradually Appeared. The wound is located on the Right,Distal Gluteus. The wound measures 1.4cm length x 2.4cm width x 0.1cm depth; 2.639cm^2 area and 0.264cm^3 volume. There is Fat Layer (Subcutaneous Tissue) exposed. There is no tunneling or undermining noted. There is a small amount of serosanguineous drainage noted. The wound margin is flat and intact. There is large (67-100%) red, pink granulation within the wound bed. There is a small (1-33%) amount of necrotic tissue within the wound bed including Adherent Slough. Wound #4 status is Healed - Epithelialized. Original cause of wound was Gradually Appeared. The wound is located on the Right,Medial Gluteus. The wound measures 0cm length x 0cm width x 0cm  depth; 0cm^2 area and 0cm^3 volume. There is no tunneling or undermining noted. There is a none present amount of drainage noted. The wound margin is distinct with the outline attached to the wound base. There is no granulation within the wound bed. There is no necrotic tissue within the wound bed. Wound #5 status is Open. Original cause of wound was Bite. The wound is located on the Left Hand - 3rd Digit. The wound measures 0.4cm length x 0.1cm width x 0.1cm depth; 0.031cm^2 area and 0.003cm^3 volume. There is bone and Fat Layer (Subcutaneous Tissue) exposed. There is no tunneling or undermining noted. There is a none present  amount of drainage noted. The wound margin is well defined and not attached to the wound base. There is large (67- 100%) pink granulation within the wound bed. There is no necrotic tissue within the wound bed. Wound #6 status is Healed - Epithelialized. Original cause of wound was Gradually Appeared. The wound is located on the Right,Lateral Lower Leg. The wound measures 0cm length x 0cm width x 0cm depth; 0cm^2 area and 0cm^3 volume. There is no tunneling or undermining noted. There is a none present amount of drainage noted. The wound margin is distinct with the outline attached to the wound base. There is no granulation within the wound bed. There is no necrotic tissue within the wound bed. Wound #7 status is Open. Original cause of wound was Gradually Appeared. The wound is located on the Right,Proximal Gluteus. The wound measures 0.9cm length x 1.4cm width x 0.1cm depth; 0.99cm^2 area and 0.099cm^3 volume. There is Fat Layer (Subcutaneous Tissue) exposed. There is no tunneling or undermining noted. There is a small amount of serosanguineous drainage noted. The wound margin is distinct with the outline attached to the wound base. There is medium (34-66%) red, pink granulation within the wound bed. There is a medium (34-66%) amount of necrotic tissue within the wound bed including Adherent Slough. Assessment Active Problems ICD-10 Chronic venous hypertension (idiopathic) with ulcer and inflammation of right lower extremity Pressure ulcer of right buttock, unstageable Unspecified soft tissue disorder related to use, overuse and pressure, left hand Vasculitis limited to the skin, unspecified Non-pressure chronic ulcer of other part of left lower leg with other specified severity Cutaneous abscess of other sites Type 2 diabetes mellitus with diabetic chronic kidney disease Plan Follow-up Appointments: Return Appointment in 2 weeks. Dressing Change Frequency: Wound #3 Right,Distal  Gluteus: Change Dressing every other day. Wound #5 Left Hand - 3rd Digit: Change dressing every day. Wound #7 Right,Proximal Gluteus: Change Dressing every other day. Wound Cleansing: May shower and wash wound with soap and water. - on days that dressing is changed Primary Wound Dressing: Wound #3 Right,Distal Gluteus: Calcium Alginate with Silver - pack lightly into wound bed. Wound #5 Left Hand - 3rd Digit: Calcium Alginate with Silver Calcium Alginate with Silver - apply neosporin under alginate. Wound #7 Right,Proximal Gluteus: Calcium Alginate with Silver Secondary Dressing: Wound #3 Right,Distal Gluteus: ABD pad - or bordered foam/bordered gauze. Wound #5 Left Hand - 3rd Digit: Kerlix/Rolled Gauze Dry Gauze Wound #7 Right,Proximal Gluteus: ABD pad - or bordered foam/bordered gauze. #1 this patient is considerably better. I am more convinced that ever that he had calciphylaxis involving several open areas on the right buttock. 2. Infection in the body at right buttock from last time is healed multiple gram-negative's I gave him Levaquin. 3. The 2 wounds on the right buttock are superficial and look like they will be epithelialized  by the next time we see him. 4. Similarly the areas on his left thigh right calf are healed as well 5. Left finger still a linear open area and some tenderness. I was concerned this was ischemic last time it turns out that this was probably just a necrotic infection from a combination of Proteus, Morganella and MSSA. This also responded to the Levaquin I gave him. I have asked him to use Neosporin on the wound cover it with silver alginate this is considerably better as well. 6. Noteworthy that his fingers are still cool I wonder if he has micro vascular disease in his fingers but certainly his macro flow in the left arm extending into the palmar arch is normal I have asked him to come back in 2 weeks. After that I will contact dialysis if the areas on  his buttock are closed they can discontinue the thiosulfate. I have warned the patient that it is possible that this will return. He will need to be vigilant Electronic Signature(s) Signed: 09/16/2020 4:57:00 PM By: Linton Ham MD Entered By: Linton Ham on 09/16/2020 08:45:00 -------------------------------------------------------------------------------- SuperBill Details Patient Name: Date of Service: [name] 09/16/2020 Medical Record Number: [id number] Patient Account Number: [id number] Date of Birth/Sex: Treating RN: 1956-08-29 (64 y.o. [name] Primary Care Provider: [name] Other Clinician: Referring Provider: Treating Provider/Extender: Randon Goldsmith in Treatment: 14 Diagnosis Coding ICD-10 Codes Code Description [phone] Chronic venous hypertension (idiopathic) with ulcer and inflammation of right lower extremity L89.310 Pressure ulcer of right buttock, unstageable M70.942 Unspecified soft tissue disorder related to use, overuse and pressure, left hand L95.9 Vasculitis limited to the skin, unspecified L97.828 Non-pressure chronic ulcer of other part of left lower leg with other specified severity L02.818 Cutaneous abscess of other sites E11.22 Type 2 diabetes mellitus with diabetic chronic kidney disease Facility Procedures CPT4 Code: [id number] Description: 99214 - WOUND CARE VISIT-LEV 4 EST PT Modifier: Quantity: 1 Physician Procedures : CPT4 Code Description Modifier [id number] 99214 - WC PHYS LEVEL 4 - EST PT ICD-10 Diagnosis Description L89.310 Pressure ulcer of right buttock, unstageable L97.828 Non-pressure chronic ulcer of other part of left lower leg with other specified severity  L02.818 Cutaneous abscess of other sites M70.942 Unspecified soft tissue disorder related to use, overuse and pressure, left hand Quantity: 1 Electronic Signature(s) Signed: 09/16/2020 4:57:00 PM By: Linton Ham MD Entered By: Linton Ham on 09/16/2020 08:45:40

## 2020-09-18 NOTE — Progress Notes (Signed)
[name], [name] ([id number]) Visit Report for 09/16/2020 Arrival Information Details Patient Name: Date of Service: [name], [name] 09/16/2020 8:00 A M Medical Record Number: [id number] Patient Account Number: [id number] Date of Birth/Sex: Treating RN: [birth year] (64 y.o. Ernestene Mention Primary Care Jammal Sarr: [name] Other Clinician: Referring Janiyha Montufar: Treating Reegan Mctighe/Extender: Randon Goldsmith in Treatment: 35 Visit Information History Since Last Visit Added or deleted any medications: No Patient Arrived: Ambulatory Any new allergies or adverse reactions: No Arrival Time: 07:57 Had a fall or experienced change in No Accompanied By: self activities of daily living that may affect Transfer Assistance: None risk of falls: Patient Identification Verified: Yes Signs or symptoms of abuse/neglect since last No Secondary Verification Process Completed: Yes visito Patient Requires Transmission-Based Precautions: No Hospitalized since last visit: No Patient Has Alerts: Yes Implantable device outside of the clinic No Patient Alerts: Right ABI/TBI: 1.35 excluding cellular tissue based products placed in the center since last visit: Has Dressing in Place as Prescribed: Yes Has Footwear/Offloading in Place as Yes Prescribed: Left: Removable Cast Walker/Walking Boot Pain Present Now: Yes Electronic Signature(s) Signed: 09/17/2020 5:51:45 PM By: Baruch Gouty RN, BSN Entered By: Baruch Gouty on 09/16/2020 07:58:20 -------------------------------------------------------------------------------- Clinic Level of Care Assessment Details Patient Name: Date of Service: ZAYLAN, KISSOON EL L. 09/16/2020 8:00 A M Medical Record Number: [id number] Patient Account Number: [id number] Date of Birth/Sex: Treating RN: Jul 31, 1956 (64 y.o. [name] Primary Care Camesha Farooq: [name] Other Clinician: Referring Mindie Rawdon: Treating Favian Kittleson/Extender: Randon Goldsmith in Treatment: 14 Clinic Level of Care Assessment Items TOOL 4 Quantity Score X- 1 0 Use when only an EandM is performed on FOLLOW-UP visit ASSESSMENTS - Nursing Assessment / Reassessment X- 1 10 Reassessment of Co-morbidities (includes updates in patient status) X- 1 5 Reassessment of Adherence to Treatment Plan ASSESSMENTS - Wound and Skin A ssessment / Reassessment []  - 0 Simple Wound Assessment / Reassessment - one wound X- 3 5 Complex Wound Assessment / Reassessment - multiple wounds []  - 0 Dermatologic / Skin Assessment (not related to wound area) ASSESSMENTS - Focused Assessment []  - 0 Circumferential Edema Measurements - multi extremities []  - 0 Nutritional Assessment / Counseling / Intervention []  - 0 Lower Extremity Assessment (monofilament, tuning fork, pulses) []  - 0 Peripheral Arterial Disease Assessment (using hand held doppler) ASSESSMENTS - Ostomy and/or Continence Assessment and Care []  - 0 Incontinence Assessment and Management []  - 0 Ostomy Care Assessment and Management (repouching, etc.) PROCESS - Coordination of Care []  - 0 Simple Patient / Family Education for ongoing care X- 1 20 Complex (extensive) Patient / Family Education for ongoing care X- 1 10 Staff obtains Programmer, systems, Records, T Results / Process Orders est []  - 0 Staff telephones HHA, Nursing Homes / Clarify orders / etc []  - 0 Routine Transfer to another Facility (non-emergent condition) []  - 0 Routine Hospital Admission (non-emergent condition) []  - 0 New Admissions / Biomedical engineer / Ordering NPWT Apligraf, etc. , []  - 0 Emergency Hospital Admission (emergent condition) X- 1 10 Simple Discharge Coordination []  - 0 Complex (extensive) Discharge Coordination PROCESS - Special Needs []  - 0 Pediatric / Minor Patient Management []  - 0 Isolation Patient Management []  - 0 Hearing / Language / Visual special needs []  - 0 Assessment of  Community assistance (transportation, D/C planning, etc.) []  - 0 Additional assistance / Altered mentation []  - 0 Support Surface(s) Assessment (bed, cushion, seat, etc.) INTERVENTIONS - Wound Cleansing / Measurement []  -  0 Simple Wound Cleansing - one wound X- 3 5 Complex Wound Cleansing - multiple wounds X- 1 5 Wound Imaging (photographs - any number of wounds) []  - 0 Wound Tracing (instead of photographs) []  - 0 Simple Wound Measurement - one wound X- 3 5 Complex Wound Measurement - multiple wounds INTERVENTIONS - Wound Dressings []  - 0 Small Wound Dressing one or multiple wounds X- 2 15 Medium Wound Dressing one or multiple wounds []  - 0 Large Wound Dressing one or multiple wounds X- 1 5 Application of Medications - topical []  - 0 Application of Medications - injection INTERVENTIONS - Miscellaneous []  - 0 External ear exam []  - 0 Specimen Collection (cultures, biopsies, blood, body fluids, etc.) []  - 0 Specimen(s) / Culture(s) sent or taken to Lab for analysis []  - 0 Patient Transfer (multiple staff / Civil Service fast streamer / Similar devices) []  - 0 Simple Staple / Suture removal (25 or less) []  - 0 Complex Staple / Suture removal (26 or more) []  - 0 Hypo / Hyperglycemic Management (close monitor of Blood Glucose) []  - 0 Ankle / Brachial Index (ABI) - do not check if billed separately X- 1 5 Vital Signs Has the patient been seen at the hospital within the last three years: Yes Total Score: 145 Level Of Care: New/Established - Level 4 Electronic Signature(s) Signed: 09/17/2020 9:08:43 AM By: Carlene Coria RN Entered By: Carlene Coria on 09/16/2020 08:33:21 -------------------------------------------------------------------------------- Complex / Palliative Patient Assessment Details Patient Name: Date of Service: Nicholas Sheen EL L. 09/16/2020 8:00 A M Medical Record Number: [id number] Patient Account Number: [id number] Date of Birth/Sex: Treating RN: Jul 02, 1956 (64 y.o. Janyth Contes Primary Care Dorinda Stehr: [name] Other Clinician: Referring Irelynn Schermerhorn: Treating Sunita Demond/Extender: Randon Goldsmith in Treatment: 14 Palliative Management Criteria Complex Wound Management Criteria Patient has remarkable or complex co-morbidities requiring medications or treatments that extend wound healing times. Examples: Diabetes mellitus with chronic renal failure or end stage renal disease requiring dialysis Advanced or poorly controlled rheumatoid arthritis Diabetes mellitus and end stage chronic obstructive pulmonary disease Active cancer with current chemo- or radiation therapy Type 2 Diabetes, ESRD on dialysis Care Approach Wound Care Plan: Complex Wound Management Electronic Signature(s) Signed: 09/18/2020 5:37:49 PM By: Linton Ham MD Signed: 09/18/2020 5:54:11 PM By: Levan Hurst RN, BSN Entered By: Levan Hurst on 09/17/2020 15:03:57 -------------------------------------------------------------------------------- Encounter Discharge Information Details Patient Name: Date of Service: Nicholas Sheen EL L. 09/16/2020 8:00 A M Medical Record Number: [id number] Patient Account Number: [id number] Date of Birth/Sex: Treating RN: 06/25/1956 (64 y.o. Janyth Contes Primary Care Per Beagley: [name] Other Clinician: Referring Sarit Sparano: Treating Imanii Gosdin/Extender: Randon Goldsmith in Treatment: 14 Encounter Discharge Information Items Discharge Condition: Stable Ambulatory Status: Ambulatory Discharge Destination: Home Transportation: Private Auto Accompanied By: alone Schedule Follow-up Appointment: Yes Clinical Summary of Care: Patient Declined Electronic Signature(s) Signed: 09/18/2020 5:54:11 PM By: Levan Hurst RN, BSN Entered By: Levan Hurst on 09/16/2020 12:14:35 -------------------------------------------------------------------------------- Lower Extremity Assessment Details Patient Name: Date  of Service: Nicholas Sheen EL L. 09/16/2020 8:00 A M Medical Record Number: [id number] Patient Account Number: [id number] Date of Birth/Sex: Treating RN: 09-16-1956 (64 y.o. Ernestene Mention Primary Care Koryn Charlot: [name] Other Clinician: Referring Solace Manwarren: Treating Concha Sudol/Extender: Randon Goldsmith in Treatment: 14 Edema Assessment Assessed: Shirlyn Goltz: No] Patrice Paradise: No] Edema: [Left: Ye] [Right: s] Calf Left: Right: Point of Measurement: From Medial Instep 41 cm Ankle Left: Right: Point of Measurement: From Medial Instep 26 cm Electronic Signature(s) Signed: 09/17/2020  5:51:45 PM By: Baruch Gouty RN, BSN Entered By: Baruch Gouty on 09/16/2020 08:03:51 -------------------------------------------------------------------------------- Multi Wound Chart Details Patient Name: Date of Service: Nicholas Sheen EL L. 09/16/2020 8:00 A M Medical Record Number: [id number] Patient Account Number: [id number] Date of Birth/Sex: Treating RN: [birth year] (64 y.o. [name] Primary Care Helma Argyle: [name] Other Clinician: Referring Rejoice Heatwole: Treating Othal Kubitz/Extender: Randon Goldsmith in Treatment: 14 Vital Signs Height(in): 34 Pulse(bpm): 8 Weight(lbs): 250 Blood Pressure(mmHg): 119/84 Body Mass Index(BMI): 33 Temperature(F): 98.4 Respiratory Rate(breaths/min): 18 Photos: [3:No Photos Right, Distal Gluteus] [4:No Photos Right, Medial Gluteus] [5:No Photos Left Hand - 3rd Digit] Wound Location: [3:Gradually Appeared] [4:Gradually Appeared] [5:Bite] Wounding Event: [3:Pressure Ulcer] [4:Atypical] [5:T be determined o] Primary Etiology: [3:N/A] [4:N/A] [5:N/A] Secondary Etiology: [3:Cataracts, Sleep Apnea, Congestive] [4:Cataracts, Sleep Apnea, Congestive] [5:Cataracts, Sleep Apnea, Congestive] Comorbid History: [3:Heart Failure, Hypertension, Hepatitis C, Type II Diabetes, End Stage Renal C, Type II Diabetes, End Stage Renal Disease,  Neuropathy 03/15/2020] [4:Disease, Neuropathy 06/24/2020] [5:Heart Failure, Hypertension, Hepatitis C, Type II  Diabetes, End Stage Renal Disease, Neuropathy 08/07/2020] Date Acquired: [3:14] [4:12] [5:3] Weeks of Treatment: [3:Open] [4:Healed - Epithelialized] [5:Open] Wound Status: [3:No] [4:Yes] [5:No] Clustered Wound: [3:N/A] [4:5] [5:N/A] Clustered Quantity: [3:1.4x2.4x0.1] [4:0x0x0] [5:0.4x0.1x0.1] Measurements L x W x D (cm) [3:2.639] [4:0] [5:0.031] A (cm) : rea [3:0.264] [4:0] [5:0.003] Volume (cm) : [3:88.60%] [4:100.00%] [5:97.60%] % Reduction in Area: [3:99.50%] [4:100.00%] [5:98.80%] % Reduction in Volume: [3:Category/Stage III] [4:Full Thickness Without Exposed] [5:Full Thickness With Exposed Support] Classification: [3:Small] [4:Support Structures None Present] [5:Structures None Present] Exudate Amount: [3:Serosanguineous] [4:N/A] [5:N/A] Exudate Type: [3:red, brown] [4:N/A] [5:N/A] Exudate Color: [3:Flat and Intact] [4:Distinct, outline attached] [5:Well defined, not attached] Wound Margin: [3:Large (67-100%)] [4:None Present (0%)] [5:Large (67-100%)] Granulation Amount: [3:Red, Pink] [4:N/A] [5:Pink] Granulation Quality: [3:Small (1-33%)] [4:None Present (0%)] [5:None Present (0%)] Necrotic Amount: [3:Fat Layer (Subcutaneous Tissue): Yes Fascia: No] [5:Fat Layer (Subcutaneous Tissue): Yes] Exposed Structures: [3:Fascia: No Tendon: No Muscle: No Joint: No Bone: No Small (1-33%)] [4:Fat Layer (Subcutaneous Tissue): No Tendon: No Muscle: No Joint: No Bone: No Large (67-100%)] [5:Bone: Yes Fascia: No Tendon: No Muscle: No Joint: No Small (1-33%)] Wound Number: 6 7 N/A Photos: No Photos No Photos N/A Right, Lateral Lower Leg Right, Proximal Gluteus N/A Wound Location: Gradually Appeared Gradually Appeared N/A Wounding Event: Venous Leg Ulcer Atypical N/A Primary Etiology: Trauma, Other N/A N/A Secondary Etiology: Cataracts, Sleep Apnea, Congestive Cataracts, Sleep Apnea,  Congestive N/A Comorbid History: Heart Failure, Hypertension, Hepatitis Heart Failure, Hypertension, Hepatitis C, Type II Diabetes, End Stage RenalC, Type II Diabetes, End Stage Renal Disease, Neuropathy Disease, Neuropathy 08/15/2020 08/15/2020 N/A Date Acquired: 3 3 N/A Weeks of Treatment: Healed - Epithelialized Open N/A Wound Status: No No N/A Clustered Wound: N/A N/A N/A Clustered Quantity: 0x0x0 0.9x1.4x0.1 N/A Measurements L x W x D (cm) 0 0.99 N/A A (cm) : rea 0 0.099 N/A Volume (cm) : 100.00% 80.90% N/A % Reduction in Area: 100.00% 97.30% N/A % Reduction in Volume: Unclassifiable Full Thickness Without Exposed N/A Classification: Support Structures None Present Small N/A Exudate Amount: N/A Serosanguineous N/A Exudate Type: N/A red, brown N/A Exudate Color: Distinct, outline attached Distinct, outline attached N/A Wound Margin: None Present (0%) Medium (34-66%) N/A Granulation Amount: N/A Red, Pink N/A Granulation Quality: None Present (0%) Medium (34-66%) N/A Necrotic Amount: Fascia: No Fat Layer (Subcutaneous Tissue): Yes N/A Exposed Structures: Fat Layer (Subcutaneous Tissue): No Fascia: No Tendon: No Tendon: No Muscle: No Muscle: No Joint: No Joint: No Bone:  No Bone: No Large (67-100%) Small (1-33%) N/A Epithelialization: Treatment Notes Electronic Signature(s) Signed: 09/16/2020 4:57:00 PM By: Linton Ham MD Signed: 09/17/2020 9:08:43 AM By: Carlene Coria RN Entered By: Linton Ham on 09/16/2020 08:37:49 -------------------------------------------------------------------------------- Multi-Disciplinary Care Plan Details Patient Name: Date of Service: Nicholas Sheen EL L. 09/16/2020 8:00 A M Medical Record Number: [id number] Patient Account Number: [id number] Date of Birth/Sex: Treating RN: [birth year] (64 y.o. [name] Primary Care Terik Haughey: [name] Other Clinician: Referring Maryella Abood: Treating Elia Nunley/Extender:  Randon Goldsmith in Treatment: 14 Active Inactive Wound/Skin Impairment Nursing Diagnoses: Impaired tissue integrity Knowledge deficit related to ulceration/compromised skin integrity Goals: Patient/caregiver will verbalize understanding of skin care regimen Date Initiated: 06/05/2020 Target Resolution Date: 10/13/2020 Goal Status: Active Interventions: Assess patient/caregiver ability to obtain necessary supplies Assess patient/caregiver ability to perform ulcer/skin care regimen upon admission and as needed Assess ulceration(s) every visit Provide education on ulcer and skin care Notes: Electronic Signature(s) Signed: 09/17/2020 9:08:43 AM By: Carlene Coria RN Entered By: Carlene Coria on 09/16/2020 07:54:16 -------------------------------------------------------------------------------- Pain Assessment Details Patient Name: Date of Service: [name], Nicholas EL L. 09/16/2020 8:00 A M Medical Record Number: [id number] Patient Account Number: [id number] Date of Birth/Sex: Treating RN: 15-May-1956 (64 y.o. Ernestene Mention Primary Care Miken Stecher: [name] Other Clinician: Referring Angelin Cutrone: Treating Vanilla Heatherington/Extender: Randon Goldsmith in Treatment: 14 Active Problems Location of Pain Severity and Description of Pain Patient Has Paino Yes Site Locations Pain Location: Generalized Pain, Pain in Ulcers With Dressing Change: Yes Duration of the Pain. Constant / Intermittento Constant Rate the pain. Current Pain Level: 8 Character of Pain Describe the Pain: Aching Pain Management and Medication Current Pain Management: Medication: Yes Is the Current Pain Management Adequate: Adequate How does your wound impact your activities of daily livingo Sleep: Yes Bathing: No Appetite: No Relationship With Others: No Bladder Continence: No Emotions: No Bowel Continence: No Drive: No Toileting: No Hobbies: Yes Dressing: No Electronic  Signature(s) Signed: 09/17/2020 5:51:45 PM By: Baruch Gouty RN, BSN Entered By: Baruch Gouty on 09/16/2020 08:01:02 -------------------------------------------------------------------------------- Patient/Caregiver Education Details Patient Name: Date of Service: [name] 11/2/2021andnbsp8:00 [place] Record Number: [id number] Patient Account Number: [id number] Date of Birth/Gender: Treating RN: 18-Feb-1956 (64 y.o. [name] Primary Care Physician: [name] Other Clinician: Referring Physician: Treating Physician/Extender: Randon Goldsmith in Treatment: 14 Education Assessment Education Provided To: Patient Education Topics Provided Wound/Skin Impairment: Methods: Explain/Verbal Responses: State content correctly Electronic Signature(s) Signed: 09/17/2020 9:08:43 AM By: Carlene Coria RN Entered By: Carlene Coria on 09/16/2020 07:54:31 -------------------------------------------------------------------------------- Wound Assessment Details Patient Name: Date of Service: [name], Nicholas EL L. 09/16/2020 8:00 A M Medical Record Number: [id number] Patient Account Number: [id number] Date of Birth/Sex: Treating RN: May 18, 1956 (64 y.o. Ernestene Mention Primary Care Cathalina Barcia: [name] Other Clinician: Referring Alexxa Sabet: Treating Nautica Hotz/Extender: Randon Goldsmith in Treatment: 14 Wound Status Wound Number: 3 Primary Pressure Ulcer Etiology: Etiology: Wound Location: Right, Distal Gluteus Wound Open Wounding Event: Gradually Appeared Status: Date Acquired: 03/15/2020 Comorbid Cataracts, Sleep Apnea, Congestive Heart Failure, Hypertension, Weeks Of Treatment: 14 History: Hepatitis C, Type II Diabetes, End Stage Renal Disease, Clustered Wound: No Neuropathy Wound Measurements Length: (cm) 1.4 Width: (cm) 2.4 Depth: (cm) 0.1 Area: (cm) 2.639 Volume: (cm) 0.264 % Reduction in Area: 88.6% % Reduction in  Volume: 99.5% Epithelialization: Small (1-33%) Tunneling: No Undermining: No Wound Description Classification: Category/Stage III Wound Margin: Flat and Intact Exudate Amount: Small Exudate Type: Serosanguineous Exudate Color:  red, brown Foul Odor After Cleansing: No Slough/Fibrino Yes Wound Bed Granulation Amount: Large (67-100%) Exposed Structure Granulation Quality: Red, Pink Fascia Exposed: No Necrotic Amount: Small (1-33%) Fat Layer (Subcutaneous Tissue) Exposed: Yes Necrotic Quality: Adherent Slough Tendon Exposed: No Muscle Exposed: No Joint Exposed: No Bone Exposed: No Treatment Notes Wound #3 (Right, Distal Gluteus) 1. Cleanse With Wound Cleanser 3. Primary Dressing Applied Calcium Alginate Ag 4. Secondary Dressing Foam Border Dressing Electronic Signature(s) Signed: 09/17/2020 5:51:45 PM By: Baruch Gouty RN, BSN Entered By: Baruch Gouty on 09/16/2020 08:19:17 -------------------------------------------------------------------------------- Wound Assessment Details Patient Name: Date of Service: Nicholas Sheen EL L. 09/16/2020 8:00 A M Medical Record Number: [id number] Patient Account Number: [id number] Date of Birth/Sex: Treating RN: 11-25-1955 (64 y.o. Ernestene Mention Primary Care Karsten Howry: [name] Other Clinician: Referring Tycho Cheramie: Treating Steed Kanaan/Extender: Randon Goldsmith in Treatment: 14 Wound Status Wound Number: 4 Primary Atypical Etiology: Wound Location: Right, Medial Gluteus Wound Healed - Epithelialized Wounding Event: Gradually Appeared Status: Date Acquired: 06/24/2020 Comorbid Cataracts, Sleep Apnea, Congestive Heart Failure, Hypertension, Weeks Of Treatment: 12 History: Hepatitis C, Type II Diabetes, End Stage Renal Disease, Clustered Wound: Yes Neuropathy Wound Measurements Length: (cm) Width: (cm) Depth: (cm) Clustered Quantity: Area: (cm) Volume: (cm) 0 % Reduction in Area: 100% 0 %  Reduction in Volume: 100% 0 Epithelialization: Large (67-100%) 5 Tunneling: No 0 Undermining: No 0 Wound Description Classification: Full Thickness Without Exposed Support Structures Wound Margin: Distinct, outline attached Exudate Amount: None Present Foul Odor After Cleansing: No Slough/Fibrino No Wound Bed Granulation Amount: None Present (0%) Exposed Structure Necrotic Amount: None Present (0%) Fascia Exposed: No Fat Layer (Subcutaneous Tissue) Exposed: No Tendon Exposed: No Muscle Exposed: No Joint Exposed: No Bone Exposed: No Electronic Signature(s) Signed: 09/17/2020 5:51:45 PM By: Baruch Gouty RN, BSN Entered By: Baruch Gouty on 09/16/2020 08:19:35 -------------------------------------------------------------------------------- Wound Assessment Details Patient Name: Date of Service: Nicholas Sheen EL L. 09/16/2020 8:00 A M Medical Record Number: [id number] Patient Account Number: [id number] Date of Birth/Sex: Treating RN: May 15, 1956 (64 y.o. Ernestene Mention Primary Care Chandan Fly: [name] Other Clinician: Referring Daichi Moris: Treating Lyndon Chapel/Extender: Randon Goldsmith in Treatment: 14 Wound Status Wound Number: 5 Primary T be determined o Etiology: Wound Location: Left Hand - 3rd Digit Wound Open Wounding Event: Bite Status: Date Acquired: 08/07/2020 Comorbid Cataracts, Sleep Apnea, Congestive Heart Failure, Hypertension, Weeks Of Treatment: 3 History: Hepatitis C, Type II Diabetes, End Stage Renal Disease, Clustered Wound: No Neuropathy Wound Measurements Length: (cm) 0.4 Width: (cm) 0.1 Depth: (cm) 0.1 Area: (cm) 0.031 Volume: (cm) 0.003 % Reduction in Area: 97.6% % Reduction in Volume: 98.8% Epithelialization: Small (1-33%) Tunneling: No Undermining: No Wound Description Classification: Full Thickness With Exposed Support Structures Wound Margin: Well defined, not attached Exudate Amount: None Present Foul Odor  After Cleansing: No Slough/Fibrino Yes Wound Bed Granulation Amount: Large (67-100%) Exposed Structure Granulation Quality: Pink Fascia Exposed: No Necrotic Amount: None Present (0%) Fat Layer (Subcutaneous Tissue) Exposed: Yes Tendon Exposed: No Muscle Exposed: No Joint Exposed: No Bone Exposed: Yes Treatment Notes Wound #5 (Left Hand - 3rd Digit) 1. Cleanse With Wound Cleanser 3. Primary Dressing Applied Calcium Alginate Ag 4. Secondary Dressing Dry Gauze Roll Gauze Notes neosporin under alginate Electronic Signature(s) Signed: 09/17/2020 5:51:45 PM By: Baruch Gouty RN, BSN Entered By: Baruch Gouty on 09/16/2020 08:20:00 -------------------------------------------------------------------------------- Wound Assessment Details Patient Name: Date of Service: Nicholas Sheen EL L. 09/16/2020 8:00 A M Medical Record Number: [id number] Patient Account Number: [id number] Date of Birth/Sex: Treating  RN: [birth year] (65 y.o. Ernestene Mention Primary Care Gevin Perea: [name] Other Clinician: Referring Leoncio Hansen: Treating Clarrisa Kaylor/Extender: Randon Goldsmith in Treatment: 14 Wound Status Wound Number: 6 Primary Venous Leg Ulcer Etiology: Wound Location: Right, Lateral Lower Leg Secondary Trauma, Other Wounding Event: Gradually Appeared Etiology: Date Acquired: 08/15/2020 Wound Healed - Epithelialized Weeks Of Treatment: 3 Status: Clustered Wound: No Comorbid Cataracts, Sleep Apnea, Congestive Heart Failure, Hypertension, History: Hepatitis C, Type II Diabetes, End Stage Renal Disease, Neuropathy Wound Measurements Length: (cm) Width: (cm) Depth: (cm) Area: (cm) Volume: (cm) 0 % Reduction in Area: 100% 0 % Reduction in Volume: 100% 0 Epithelialization: Large (67-100%) 0 Tunneling: No 0 Undermining: No Wound Description Classification: Unclassifiable Wound Margin: Distinct, outline attached Exudate Amount: None Present Foul Odor After  Cleansing: No Slough/Fibrino No Wound Bed Granulation Amount: None Present (0%) Exposed Structure Necrotic Amount: None Present (0%) Fascia Exposed: No Fat Layer (Subcutaneous Tissue) Exposed: No Tendon Exposed: No Muscle Exposed: No Joint Exposed: No Bone Exposed: No Electronic Signature(s) Signed: 09/17/2020 5:51:45 PM By: Baruch Gouty RN, BSN Entered By: Baruch Gouty on 09/16/2020 08:04:58 -------------------------------------------------------------------------------- Wound Assessment Details Patient Name: Date of Service: Nicholas Sheen EL L. 09/16/2020 8:00 A M Medical Record Number: [id number] Patient Account Number: [id number] Date of Birth/Sex: Treating RN: 08/09/1956 (64 y.o. Ernestene Mention Primary Care Janeth Terry: [name] Other Clinician: Referring Caelan Branden: Treating Denetria Luevanos/Extender: Randon Goldsmith in Treatment: 14 Wound Status Wound Number: 7 Primary Atypical Etiology: Wound Location: Right, Proximal Gluteus Wound Open Wounding Event: Gradually Appeared Status: Date Acquired: 08/15/2020 Comorbid Cataracts, Sleep Apnea, Congestive Heart Failure, Hypertension, Weeks Of Treatment: 3 History: Hepatitis C, Type II Diabetes, End Stage Renal Disease, Clustered Wound: No Neuropathy Wound Measurements Length: (cm) 0.9 Width: (cm) 1.4 Depth: (cm) 0.1 Area: (cm) 0.99 Volume: (cm) 0.099 % Reduction in Area: 80.9% % Reduction in Volume: 97.3% Epithelialization: Small (1-33%) Tunneling: No Undermining: No Wound Description Classification: Full Thickness Without Exposed Support Structures Wound Margin: Distinct, outline attached Exudate Amount: Small Exudate Type: Serosanguineous Exudate Color: red, brown Foul Odor After Cleansing: No Slough/Fibrino Yes Wound Bed Granulation Amount: Medium (34-66%) Exposed Structure Granulation Quality: Red, Pink Fascia Exposed: No Necrotic Amount: Medium (34-66%) Fat Layer (Subcutaneous  Tissue) Exposed: Yes Necrotic Quality: Adherent Slough Tendon Exposed: No Muscle Exposed: No Joint Exposed: No Bone Exposed: No Treatment Notes Wound #7 (Right, Proximal Gluteus) 1. Cleanse With Wound Cleanser 3. Primary Dressing Applied Calcium Alginate Ag 4. Secondary Dressing Foam Border Dressing Electronic Signature(s) Signed: 09/17/2020 5:51:45 PM By: Baruch Gouty RN, BSN Entered By: Baruch Gouty on 09/16/2020 08:20:26 -------------------------------------------------------------------------------- [place] Details Patient Name: Date of Service: Nicholas Sheen EL L. 09/16/2020 8:00 A M Medical Record Number: [id number] Patient Account Number: [id number] Date of Birth/Sex: Treating RN: [birth year] (64 y.o. Ernestene Mention Primary Care Wenzel Backlund: Other Clinician: [name] Referring Rose-Marie Hickling: Treating Arvie Villarruel/Extender: Randon Goldsmith in Treatment: 14 Vital Signs Time Taken: 07:58 Temperature (F): 98.4 Height (in): 73 Pulse (bpm): 83 Weight (lbs): 250 Respiratory Rate (breaths/min): 18 Body Mass Index (BMI): 33 Blood Pressure (mmHg): 119/84 Reference Range: 80 - 120 mg / dl Electronic Signature(s) Signed: 09/17/2020 5:51:45 PM By: Baruch Gouty RN, BSN Entered By: Baruch Gouty on 09/16/2020 08:00:11

## 2020-09-26 ENCOUNTER — Telehealth: Payer: Self-pay | Admitting: Podiatry

## 2020-09-26 NOTE — Telephone Encounter (Signed)
Thank you :)

## 2020-09-26 NOTE — Telephone Encounter (Signed)
Pts wife [name]) called and left message asking for me to call her back that the [facility].  I returned call and she said pt is in dialysis and is in need of a new boot and the if we [facility] stating pt needs to continue to wear the boot so they can provide one for the pt. She gave me 2 fax numbers [phone] and [phone] attention to [facility].   I have faxed it to both numbers.

## 2020-09-30 ENCOUNTER — Other Ambulatory Visit: Payer: Self-pay

## 2020-09-30 ENCOUNTER — Encounter (HOSPITAL_BASED_OUTPATIENT_CLINIC_OR_DEPARTMENT_OTHER): Payer: Medicare (Managed Care) | Admitting: Internal Medicine

## 2020-09-30 DIAGNOSIS — E11621 Type 2 diabetes mellitus with foot ulcer: Secondary | ICD-10-CM | POA: Diagnosis not present

## 2020-09-30 DIAGNOSIS — L89313 Pressure ulcer of right buttock, stage 3: Secondary | ICD-10-CM | POA: Diagnosis not present

## 2020-09-30 NOTE — Progress Notes (Signed)
[name] ([id number]) Visit Report for 09/30/2020 Arrival Information Details Patient Name: Date of Service: [name], [name] 09/30/2020 8:00 A M Medical Record Number: [id number] Patient Account Number: [id number] Date of Birth/Sex: Treating RN: April 16, 1956 (64 y.o. Ernestene Mention Primary Care Janeth Terry: [name] Other Clinician: Referring Tytiana Coles: Treating Elayjah Chaney/Extender: Randon Goldsmith in Treatment: 62 Visit Information History Since Last Visit Added or deleted any medications: No Patient Arrived: Ambulatory Any new allergies or adverse reactions: No Arrival Time: 08:19 Had a fall or experienced change in No Accompanied By: self activities of daily living that may affect Transfer Assistance: None risk of falls: Patient Identification Verified: Yes Signs or symptoms of abuse/neglect since last visito No Secondary Verification Process Completed: Yes Hospitalized since last visit: No Patient Requires Transmission-Based Precautions: No Implantable device outside of the clinic excluding No Patient Has Alerts: Yes cellular tissue based products placed in the center Patient Alerts: Right ABI/TBI: 1.35 since last visit: Has Dressing in Place as Prescribed: Yes Pain Present Now: Yes Electronic Signature(s) Signed: 09/30/2020 5:21:16 PM By: Baruch Gouty RN, BSN Entered By: Baruch Gouty on 09/30/2020 08:20:18 -------------------------------------------------------------------------------- Encounter Discharge Information Details Patient Name: Date of Service: Nicholas Sheen EL L. 09/30/2020 8:00 A M Medical Record Number: [id number] Patient Account Number: [id number] Date of Birth/Sex: Treating RN: 1956/03/18 (64 y.o. Hessie Diener Primary Care My Madariaga: [name] Other Clinician: Referring Beauty Pless: Treating Zebadiah Willert/Extender: Randon Goldsmith in Treatment: 16 Encounter Discharge Information Items Post  Procedure Vitals Discharge Condition: Stable Temperature (F): 98.4 Ambulatory Status: Ambulatory Pulse (bpm): 81 Discharge Destination: Home Respiratory Rate (breaths/min): 18 Transportation: Private Auto Blood Pressure (mmHg): 104/55 Accompanied By: self Schedule Follow-up Appointment: Yes Clinical Summary of Care: Electronic Signature(s) Signed: 09/30/2020 12:08:35 PM By: Deon Pilling Entered By: Deon Pilling on 09/30/2020 09:05:03 -------------------------------------------------------------------------------- Lower Extremity Assessment Details Patient Name: Date of Service: [name], [name] 09/30/2020 8:00 A M Medical Record Number: [id number] Patient Account Number: [id number] Date of Birth/Sex: Treating RN: 05-Oct-1956 (64 y.o. Ernestene Mention Primary Care Anastasios Melander: [name] Other Clinician: Referring Miriah Maruyama: Treating Elisha Mcgruder/Extender: Randon Goldsmith in Treatment: 16 Electronic Signature(s) Signed: 09/30/2020 5:21:16 PM By: Baruch Gouty RN, BSN Entered By: Baruch Gouty on 09/30/2020 08:32:02 -------------------------------------------------------------------------------- Multi Wound Chart Details Patient Name: Date of Service: Nicholas Sheen EL L. 09/30/2020 8:00 A M Medical Record Number: [id number] Patient Account Number: [id number] Date of Birth/Sex: Treating RN: 04-11-1956 (64 y.o. [name]) Carlene Coria Primary Care Alyric Parkin: [name] Other Clinician: Referring Yomayra Tate: Treating Radiah Lubinski/Extender: Randon Goldsmith in Treatment: 16 Vital Signs Height(in): 39 Pulse(bpm): 78 Weight(lbs): 250 Blood Pressure(mmHg): 104/55 Body Mass Index(BMI): 33 Temperature(F): 98.4 Respiratory Rate(breaths/min): 18 Photos: [3:No Photos Right, Distal Gluteus] [5:No Photos Left Hand - 3rd Digit] [7:No Photos Right, Proximal Gluteus] Wound Location: [3:Gradually Appeared] [5:Bite] [7:Gradually Appeared] Wounding Event:  [3:Pressure Ulcer] [5:Arterial Insufficiency Ulcer] [7:Atypical] Primary Etiology: [3:Cataracts, Sleep Apnea, Congestive] [5:Cataracts, Sleep Apnea, Congestive] [7:Cataracts, Sleep Apnea, Congestive] Comorbid History: [3:Heart Failure, Hypertension, Hepatitis C, Type II Diabetes, End Stage Renal C, Type II Diabetes, End Stage Renal Disease, Neuropathy 03/15/2020] [5:Disease, Neuropathy 08/07/2020] [7:Heart Failure, Hypertension, Hepatitis C, Type II  Diabetes, End Stage Renal Disease, Neuropathy 08/15/2020] Date Acquired: [3:16] [5:5] [7:5] Weeks of Treatment: [3:Open] [5:Open] [7:Open] Wound Status: [3:0.9x1.4x0.1] [5:0x0x0] [7:0.4x0.4x0.1] Measurements L x W x D (cm) [3:0.99] [5:0] [7:0.126] A (cm) : rea [3:0.099] [5:0] [7:0.013] Volume (cm) : [3:95.70%] [5:100.00%] [7:97.60%] % Reduction in Area: [3:99.80%] [5:100.00%] [7:99.60%] % Reduction  in Volume: [3:Category/Stage III] [5:Full Thickness With Exposed Support] [7:Full Thickness Without Exposed] Classification: [3:Small] [5:Structures None Present] [7:Support Structures Small] Exudate Amount: [3:Serosanguineous] [5:N/A] [7:Serosanguineous] Exudate Type: [3:red, brown] [5:N/A] [7:red, brown] Exudate Color: [3:Flat and Intact] [5:Well defined, not attached] [7:Distinct, outline attached] Wound Margin: [3:Large (67-100%)] [5:None Present (0%)] [7:Medium (34-66%)] Granulation Amount: [3:Red] [5:N/A] [7:Red] Granulation Quality: [3:None Present (0%)] [5:None Present (0%)] [7:Small (1-33%)] Necrotic Amount: [3:Fat Layer (Subcutaneous Tissue): Yes Fascia: No] [7:Fat Layer (Subcutaneous Tissue): Yes] Exposed Structures: [3:Fascia: No Tendon: No Muscle: No Joint: No Bone: No Small (1-33%)] [5:Fat Layer (Subcutaneous Tissue): No Tendon: No Muscle: No Joint: No Bone: No Large (67-100%)] [7:Fascia: No Tendon: No Muscle: No Joint: No Bone: No Small (1-33%)] Epithelialization: [3:Debridement - Selective/Open Wound N/A] [7:N/A] Debridement: [3:08:42]  [5:N/A] [7:N/A] Pre-procedure Verification/Time Out Taken: [3:Lidocaine 5% topical ointment] [5:N/A] [7:N/A] Pain Control: [3:Skin/Epidermis] [5:N/A] [7:N/A] Level: [3:1.26] [5:N/A] [7:N/A] Debridement A (sq cm): [3:rea Curette] [5:N/A] [7:N/A] Instrument: [3:Moderate] [5:N/A] [7:N/A] Bleeding: [3:Pressure] [5:N/A] [7:N/A] Hemostasis A chieved: [3:0] [5:N/A] [7:N/A] Procedural Pain: [3:0] [5:N/A] [7:N/A] Post Procedural Pain: [3:Procedure was tolerated well] [5:N/A] [7:N/A] Debridement Treatment Response: [3:0.9x1.4x0.1] [5:N/A] [7:N/A] Post Debridement Measurements L x W x D (cm) [3:0.099] [5:N/A] [7:N/A] Post Debridement Volume: (cm) [3:Category/Stage III] [5:N/A] [7:N/A] Post Debridement Stage: [3:Debridement] [5:N/A] [7:N/A] Treatment Notes Electronic Signature(s) Signed: 09/30/2020 4:54:15 PM By: Linton Ham MD Signed: 09/30/2020 5:00:30 PM By: Carlene Coria RN Entered By: Linton Ham on 09/30/2020 08:49:27 -------------------------------------------------------------------------------- Multi-Disciplinary Care Plan Details Patient Name: Date of Service: Nicholas Sheen EL L. 09/30/2020 8:00 A M Medical Record Number: [id number] Patient Account Number: [id number] Date of Birth/Sex: Treating RN: 05-Jan-1956 (64 y.o. [name] Primary Care Doyce Saling: [name] Other Clinician: Referring Jennel Mara: Treating Ojani Berenson/Extender: Randon Goldsmith in Treatment: 16 Active Inactive Wound/Skin Impairment Nursing Diagnoses: Impaired tissue integrity Knowledge deficit related to ulceration/compromised skin integrity Goals: Patient/caregiver will verbalize understanding of skin care regimen Date Initiated: 06/05/2020 Target Resolution Date: 10/13/2020 Goal Status: Active Interventions: Assess patient/caregiver ability to obtain necessary supplies Assess patient/caregiver ability to perform ulcer/skin care regimen upon admission and as needed Assess  ulceration(s) every visit Provide education on ulcer and skin care Notes: Electronic Signature(s) Signed: 09/30/2020 5:00:30 PM By: Carlene Coria RN Entered By: Carlene Coria on 09/30/2020 08:19:17 -------------------------------------------------------------------------------- Pain Assessment Details Patient Name: Date of Service: FYNN, VANBLARCOM EL L. 09/30/2020 8:00 A M Medical Record Number: [id number] Patient Account Number: [id number] Date of Birth/Sex: Treating RN: 31-Aug-1956 (64 y.o. Ernestene Mention Primary Care Karon Heckendorn: [name] Other Clinician: Referring Elex Mainwaring: Treating Zyrion Coey/Extender: Randon Goldsmith in Treatment: 16 Active Problems Location of Pain Severity and Description of Pain Patient Has Paino Yes Site Locations Pain Location: Pain in Ulcers With Dressing Change: Yes Duration of the Pain. Constant / Intermittento Intermittent Rate the pain. Current Pain Level: 3 Character of Pain Describe the Pain: Aching Pain Management and Medication Current Pain Management: Is the Current Pain Management Adequate: Adequate How does your wound impact your activities of daily livingo Sleep: No Bathing: No Appetite: No Relationship With Others: No Bladder Continence: No Emotions: No Bowel Continence: No Work: No Toileting: No Drive: No Dressing: No Hobbies: No Electronic Signature(s) Signed: 09/30/2020 5:21:16 PM By: Baruch Gouty RN, BSN Entered By: Baruch Gouty on 09/30/2020 08:30:04 -------------------------------------------------------------------------------- Patient/Caregiver Education Details Patient Name: Date of Service: Ladona Mow 11/16/2021andnbsp8:00 A M Medical Record Number: [id number] Patient Account Number: [id number] Date of Birth/Gender: Treating RN: [birth year] (64 y.o. [name]) Carlene Coria Primary  Care Physician: [name] Other Clinician: Referring Physician: Treating Physician/Extender: Randon Goldsmith in Treatment: 16 Education Assessment Education Provided To: Patient Education Topics Provided Wound/Skin Impairment: Methods: Explain/Verbal Responses: State content correctly [employer]) Signed: 09/30/2020 5:00:30 PM By: Carlene Coria RN Entered By: Carlene Coria on 09/30/2020 08:19:37 -------------------------------------------------------------------------------- Wound Assessment Details Patient Name: Date of Service: WILKIN, LIPPY EL L. 09/30/2020 8:00 A M Medical Record Number: [id number] Patient Account Number: [id number] Date of Birth/Sex: Treating RN: [birth year] (64 y.o. Ernestene Mention Primary Care Allayna Erlich: [name] Other Clinician: Referring Willodene Stallings: Treating Takyah Ciaramitaro/Extender: Randon Goldsmith in Treatment: 16 Wound Status Wound Number: 3 Primary Pressure Ulcer Etiology: Wound Location: Right, Distal Gluteus Wound Open Wounding Event: Gradually Appeared Status: Date Acquired: 03/15/2020 Comorbid Cataracts, Sleep Apnea, Congestive Heart Failure, Hypertension, Weeks Of Treatment: 16 History: Hepatitis C, Type II Diabetes, End Stage Renal Disease, Clustered Wound: No Neuropathy Wound Measurements Length: (cm) 0.9 Width: (cm) 1.4 Depth: (cm) 0.1 Area: (cm) 0.99 Volume: (cm) 0.099 % Reduction in Area: 95.7% % Reduction in Volume: 99.8% Epithelialization: Small (1-33%) Tunneling: No Undermining: No Wound Description Classification: Category/Stage III Wound Margin: Flat and Intact Exudate Amount: Small Exudate Type: Serosanguineous Exudate Color: red, brown Foul Odor After Cleansing: No Slough/Fibrino Yes Wound Bed Granulation Amount: Large (67-100%) Exposed Structure Granulation Quality: Red Fascia Exposed: No Necrotic Amount: None Present (0%) Fat Layer (Subcutaneous Tissue) Exposed: Yes Tendon Exposed: No Muscle Exposed: No Joint Exposed: No Bone Exposed: No Treatment  Notes Wound #3 (Right, Distal Gluteus) 1. Cleanse With Wound Cleanser 2. Periwound Care Skin Prep 3. Primary Dressing Applied Calcium Alginate Ag 4. Secondary Dressing Foam Border Dressing 5. Secured With Office manager) Signed: 09/30/2020 5:21:16 PM By: Baruch Gouty RN, BSN Entered By: Baruch Gouty on 09/30/2020 08:34:59 -------------------------------------------------------------------------------- Wound Assessment Details Patient Name: Date of Service: Nicholas Sheen EL L. 09/30/2020 8:00 A M Medical Record Number: [id number] Patient Account Number: [id number] Date of Birth/Sex: Treating RN: 01-16-1956 (64 y.o. Ernestene Mention Primary Care Ruvim Risko: [name] Other Clinician: Referring Angellina Ferdinand: Treating Demarlo Riojas/Extender: Randon Goldsmith in Treatment: 16 Wound Status Wound Number: 5 Primary Arterial Insufficiency Ulcer Etiology: Wound Location: Left Hand - 3rd Digit Wound Open Wounding Event: Bite Status: Date Acquired: 08/07/2020 Comorbid Cataracts, Sleep Apnea, Congestive Heart Failure, Hypertension, Weeks Of Treatment: 5 History: Hepatitis C, Type II Diabetes, End Stage Renal Disease, Clustered Wound: No Neuropathy Wound Measurements Length: (cm) Width: (cm) Depth: (cm) Area: (cm) Volume: (cm) 0 % Reduction in Area: 100% 0 % Reduction in Volume: 100% 0 Epithelialization: Large (67-100%) 0 Tunneling: No 0 Undermining: No Wound Description Classification: Full Thickness With Exposed Support Structures Wound Margin: Well defined, not attached Exudate Amount: None Present Foul Odor After Cleansing: No Slough/Fibrino No Wound Bed Granulation Amount: None Present (0%) Exposed Structure Necrotic Amount: None Present (0%) Fascia Exposed: No Fat Layer (Subcutaneous Tissue) Exposed: No Tendon Exposed: No Muscle Exposed: No Joint Exposed: No Bone Exposed: No Treatment Notes Wound #5 (Left Hand  - 3rd Digit) 1. Cleanse With Wound Cleanser 3. Primary Dressing Applied Calcium Alginate Ag Other primary dressing (specifiy in notes) 4. Secondary Dressing Dry Gauze Roll Gauze 5. Secured With [place] tape Notes neosporin under alginate Electronic Signature(s) Signed: 09/30/2020 5:21:16 PM By: Baruch Gouty RN, BSN Entered By: Baruch Gouty on 09/30/2020 08:32:59 -------------------------------------------------------------------------------- Wound Assessment Details Patient Name: Date of Service: Nicholas Sheen EL L. 09/30/2020 8:00 A M Medical Record Number: [id number] Patient Account Number: [id number]  Date of Birth/Sex: Treating RN: [birth year] (64 y.o. Ernestene Mention Primary Care Rogan Wigley: [name] Other Clinician: Referring Railyn House: Treating Tajuanna Burnett/Extender: Randon Goldsmith in Treatment: 16 Wound Status Wound Number: 7 Primary Atypical Etiology: Wound Location: Right, Proximal Gluteus Wound Open Wounding Event: Gradually Appeared Status: Date Acquired: 08/15/2020 Comorbid Cataracts, Sleep Apnea, Congestive Heart Failure, Hypertension, Weeks Of Treatment: 5 History: Hepatitis C, Type II Diabetes, End Stage Renal Disease, Clustered Wound: No Neuropathy Wound Measurements Length: (cm) 0.4 Width: (cm) 0.4 Depth: (cm) 0.1 Area: (cm) 0.126 Volume: (cm) 0.013 % Reduction in Area: 97.6% % Reduction in Volume: 99.6% Epithelialization: Small (1-33%) Tunneling: No Undermining: No Wound Description Classification: Full Thickness Without Exposed Support Structures Wound Margin: Distinct, outline attached Exudate Amount: Small Exudate Type: Serosanguineous Exudate Color: red, brown Foul Odor After Cleansing: No Slough/Fibrino Yes Wound Bed Granulation Amount: Medium (34-66%) Exposed Structure Granulation Quality: Red Fascia Exposed: No Necrotic Amount: Small (1-33%) Fat Layer (Subcutaneous Tissue) Exposed: Yes Necrotic  Quality: Adherent Slough Tendon Exposed: No Muscle Exposed: No Joint Exposed: No Bone Exposed: No Treatment Notes Wound #7 (Right, Proximal Gluteus) 1. Cleanse With Wound Cleanser 2. Periwound Care Skin Prep 3. Primary Dressing Applied Calcium Alginate Ag 4. Secondary Dressing Foam Border Dressing 5. Secured With Office manager) Signed: 09/30/2020 5:21:16 PM By: Baruch Gouty RN, BSN Entered By: Baruch Gouty on 09/30/2020 08:35:28 -------------------------------------------------------------------------------- Vitals Details Patient Name: Date of Service: Nicholas Sheen EL L. 09/30/2020 8:00 A M Medical Record Number: [id number] Patient Account Number: [id number] Date of Birth/Sex: Treating RN: [birth year] (64 y.o. Ernestene Mention Primary Care Genell Thede: [name] Other Clinician: Referring Kennya Schwenn: Treating Haislee Corso/Extender: Randon Goldsmith in Treatment: 16 Vital Signs Time Taken: 08:20 Temperature (F): 98.4 Height (in): 73 Pulse (bpm): 81 Source: Stated Respiratory Rate (breaths/min): 18 Weight (lbs): 250 Blood Pressure (mmHg): 104/55 Source: Stated Reference Range: 80 - 120 mg / dl Body Mass Index (BMI): 33 Electronic Signature(s) Signed: 09/30/2020 5:21:16 PM By: Baruch Gouty RN, BSN Entered By: Baruch Gouty on 09/30/2020 93:81:82

## 2020-09-30 NOTE — Progress Notes (Signed)
[name], [name] ([id number]) Visit Report for 09/30/2020 Debridement Details Patient Name: Date of Service: [name], [name] 09/30/2020 8:00 A M Medical Record Number: [id number] Patient Account Number: [id number] Date of Birth/Sex: Treating RN: Sep 30, 1956 (64 y.o. [name] Primary Care Provider: [name] Other Clinician: Referring Provider: Treating Provider/Extender: Randon Goldsmith in Treatment: 16 Debridement Performed for Assessment: Wound #3 Right,Distal Gluteus Performed By: Physician Ricard Dillon., MD Debridement Type: Debridement Level of Consciousness (Pre-procedure): Awake and Alert Pre-procedure Verification/Time Out Yes - 08:42 Taken: Start Time: 08:42 Pain Control: Lidocaine 5% topical ointment T Area Debrided (L x W): otal 0.9 (cm) x 1.4 (cm) = 1.26 (cm) Tissue and other material debrided: Viable, Non-Viable, Skin: Dermis , Skin: Epidermis Level: Skin/Epidermis Debridement Description: Selective/Open Wound Instrument: Curette Bleeding: Moderate Hemostasis Achieved: Pressure End Time: 08:44 Procedural Pain: 0 Post Procedural Pain: 0 Response to Treatment: Procedure was tolerated well Level of Consciousness (Post- Awake and Alert procedure): Post Debridement Measurements of Total Wound Length: (cm) 0.9 Stage: Category/Stage III Width: (cm) 1.4 Depth: (cm) 0.1 Volume: (cm) 0.099 Character of Wound/Ulcer Post Debridement: Improved Post Procedure Diagnosis Same as Pre-procedure Electronic Signature(s) Signed: 09/30/2020 4:54:15 PM By: Linton Ham MD Signed: 09/30/2020 5:00:30 PM By: Carlene Coria RN Entered By: Linton Ham on 09/30/2020 08:49:37 -------------------------------------------------------------------------------- HPI Details Patient Name: Date of Service: Nicholas Sheen EL L. 09/30/2020 8:00 A M Medical Record Number: [id number] Patient Account Number: [id number] Date of Birth/Sex: Treating  RN: 1956/04/15 (64 y.o. [name] Primary Care Provider: [name] Other Clinician: Referring Provider: Treating Provider/Extender: Randon Goldsmith in Treatment: 16 History of Present Illness HPI Description: ADMISSION 06/05/2020 This is a 64 year old man who has been on dialysis secondary to diabetes for about 5 years. He was recently seen by Dr. Amalia Hailey I think for routine and prophylactic nail care and foot care. He mentioned that he had in this area on the right lateral leg and he was referred here for review of this. He has been using gentamicin cream on the wound. He also uses a cam boot for Charcot ankle. He tells me that he does not have a wound on his left foot or ankle. Almost as a side he mentioned that he had a wound on the right gluteal area. He states that this wound had been present since he was hospitalized on 2 different occasions in March. Firstly from 2/28 through 3/6 for Covid and strep bacteremia. He was rehospitalized from 3/9 through 3/12 secondary to hypotension and hypothermia. It was notable during that admission that he had a wound on the right lateral leg they did not mention the area on the right gluteal. In any case the patient is using gentamicin on this area as well his wife changes the dressing. Past medical history; end-stage renal disease on dialysis secondary to type 2 diabetes, hypertension, hyperlipidemia Charcot ankle on the left foot and ankle, he is an ex-smoker 10 years ago remote history of hepatitis C and congestive heart failure. The patient had vascular studies on 05/01/2020 this showed ABIs on the right at 2.06 but with triphasic and biphasic waveforms his TBI was actually elevated at 1.25. He did not allow the cam boot to be removed and therefore arterial studies on the left were not done 8/10-Patient returns to clinic has continued discomfort in the right gluteal ischial area where he has a wound and less discomfort the  right lateral leg wound. We have been using Santyl to these  areas. He was biopsied at last visit and the results indicate inflamed granulation tissue and vasculopathy-the latter seems consistent with calciphylaxis and given the location of wound not clearly pressure etiology this may well be the case 8/17; I have reviewed the pathology report from the punch biopsies I did a few weeks ago. Calciphylaxis was not seen in these sections there was fibrinoid necrosis at the edges. They suggested clinical correlation and/or additional punch biopsies of calciphylaxis is strongly suspected. We are using Santyl to the area on the right buttock. As well as an area on the right leg which I think is a venous insufficiency ulcer, we have been using silver alginate and this is just about healed 8/31; 2-week follow-up. I spoke to his nephrologist after his visit 2 weeks ago we both agreed that thiosulfate at least on a trial basis might be indicated. Although we did not have biopsy-proven calciphylaxis this is certainly clinically what this is. However, apparently the patient states that they have not yet started the thiosulfate at dialysis. He is dialyzing at the [facility]. We have been using Santyl to the wound surface. He has 3 wounds on the right buttock. I think 2 of these have formed since the last time he was here. These were the original satellite lesions I describe last time. the 2 new areas have a completely necrotic surface the other looks somewhat better. The induration and firmness around this area seems somewhat better today. 9/14; 2-week follow-up. He has 3 wounds on the right buttock to the original wound and 2 satellite late lesions. The satellite lesions expanded but have a clean base the original wound is necrotic. He just started on the thiosulfate within the last 2 dialysis sessions. Apparently his nephrologist wants to speak to me about how long to run this. The other worrisome thing here  is skin breakdown around the original wound. This looks somewhat angry with a small area loss of epithelialization. Almost looks like a contact dermatitis. 10/7; the patient has not been here in 3 weeks. He still has 3 areas to the right buttock the original wound is now split into 2 and a new area. This is the area I thought was calciphylaxis he apparently was receiving thiosulfate at dialysis in [place]. He arrives with an increasing pain over this area He also has 2 new areas on the left lateral upper leg and a necrotic wound on the tip of the left mid finger. He tells me that finger wound has been there for about a month although he has not mentioned it in a body. He thinks he was bitten by something 11/2; the patient continues on thiosulfate at dialysis. The areas on his right buttock are considerably better today. I am more convinced than ever that this was calciphylaxis. The last time he was here he had a new wound on the left third finger as well as purulence on the right buttock. The right buttock cultured Klebsiella Proteus and Staph aureus methicillin sensitive, the finger cultured Proteus Morganella and methicillin sensitive staph aureus all sensitive to quinolones I gave him Levaquin postdialysis. This seems to have been satisfactory. His wounds on his legs are healed. The area on the right buttock considerably better. 2 wounds just about healed. He has an area on the left third finger still a small opening but this is considerably better than last time The vascular ultrasound I did of the left upper extremity was normal with triphasic waveforms extending throughout the radial and  ulnar arteries extending into the palmar arch. He does not have a vascular issue in his upper extremity. He has never had a shunt in his left arm 11/16; everything is closed here except for the 2 areas that are left on the right buttock. These are smaller. We have been using silver alginate on  these wounds Electronic Signature(s) Signed: 09/30/2020 4:54:15 PM By: Linton Ham MD Entered By: Linton Ham on 09/30/2020 08:53:19 -------------------------------------------------------------------------------- Physical Exam Details Patient Name: Date of Service: Nicholas Sheen EL L. 09/30/2020 8:00 A M Medical Record Number: [id number] Patient Account Number: [id number] Date of Birth/Sex: Treating RN: 1956/01/27 (64 y.o. [name] Primary Care Provider: [name] Other Clinician: Referring Provider: Treating Provider/Extender: Randon Goldsmith in Treatment: 16 Constitutional Sitting or standing Blood Pressure is within target range for patient.. Pulse regular and within target range for patient.Marland Kitchen Respirations regular, non-labored and within target range.. Temperature is normal and within the target range for the patient.Marland Kitchen Appears in no distress. Notes Wound exam Wounds over the right ischium x2. These are smaller. Eschar and debris around the circumference but the wound beds look stable. Epithelializing nicely. No evidence of infection no erythema around the wounds Electronic Signature(s) Signed: 09/30/2020 4:54:15 PM By: Linton Ham MD Entered By: Linton Ham on 09/30/2020 08:51:27 -------------------------------------------------------------------------------- Physician Orders Details Patient Name: Date of Service: Nicholas Sheen EL L. 09/30/2020 8:00 A M Medical Record Number: [id number] Patient Account Number: [id number] Date of Birth/Sex: Treating RN: 1956-05-19 (64 y.o. [name] Primary Care Provider: [name] Other Clinician: Referring Provider: Treating Provider/Extender: Randon Goldsmith in Treatment: 16 Verbal / Phone Orders: No Diagnosis Coding ICD-10 Coding Code Description [phone] Chronic venous hypertension (idiopathic) with ulcer and inflammation of right lower extremity L89.310  Pressure ulcer of right buttock, unstageable M70.942 Unspecified soft tissue disorder related to use, overuse and pressure, left hand L95.9 Vasculitis limited to the skin, unspecified L97.828 Non-pressure chronic ulcer of other part of left lower leg with other specified severity L02.818 Cutaneous abscess of other sites E11.22 Type 2 diabetes mellitus with diabetic chronic kidney disease Follow-up Appointments Return Appointment in 2 weeks. Dressing Change Frequency Wound #3 Right,Distal Gluteus Change Dressing every other day. Wound #5 Left Hand - 3rd Digit Change dressing every day. Wound #7 Right,Proximal Gluteus Change Dressing every other day. Wound Cleansing May shower and wash wound with soap and water. - on days that dressing is changed Primary Wound Dressing Wound #3 Right,Distal Gluteus lginate with Silver - pack lightly into wound bed. Calcium A Wound #5 Left Hand - 3rd Digit Calcium Alginate with Silver lginate with Silver - apply neosporin under alginate. Calcium A Wound #7 Right,Proximal Gluteus Calcium Alginate with Silver Secondary Dressing Wound #3 Right,Distal Gluteus ABD pad - or bordered foam/bordered gauze. Wound #5 Left Hand - 3rd Digit Kerlix/Rolled Gauze Dry Gauze Wound #7 Right,Proximal Gluteus ABD pad - or bordered foam/bordered gauze. Electronic Signature(s) Signed: 09/30/2020 4:54:15 PM By: Linton Ham MD Signed: 09/30/2020 5:00:30 PM By: Carlene Coria RN Entered By: Carlene Coria on 09/30/2020 08:19:09 -------------------------------------------------------------------------------- Problem List Details Patient Name: Date of Service: Nicholas Sheen EL L. 09/30/2020 8:00 A M Medical Record Number: [id number] Patient Account Number: [id number] Date of Birth/Sex: Treating RN: Dec 18, 1955 (64 y.o. [name] Primary Care Provider: [name] Other Clinician: Referring Provider: Treating Provider/Extender: Randon Goldsmith in Treatment: 16 Active Problems ICD-10 Encounter Code Description Active Date MDM Diagnosis I87.331 Chronic venous hypertension (idiopathic) with ulcer and  inflammation of right 06/05/2020 No Yes lower extremity L89.310 Pressure ulcer of right buttock, unstageable 06/05/2020 No Yes M70.942 Unspecified soft tissue disorder related to use, overuse and pressure, left 08/21/2020 No Yes hand L95.9 Vasculitis limited to the skin, unspecified 06/24/2020 No Yes L97.828 Non-pressure chronic ulcer of other part of left lower leg with other specified 08/21/2020 No Yes severity L02.818 Cutaneous abscess of other sites 08/21/2020 No Yes E11.22 Type 2 diabetes mellitus with diabetic chronic kidney disease 06/05/2020 No Yes Inactive Problems ICD-10 Code Description Active Date Inactive Date L97.212 Non-pressure chronic ulcer of right calf with fat layer exposed 06/05/2020 06/05/2020 Resolved Problems Electronic Signature(s) Signed: 09/30/2020 4:54:15 PM By: Linton Ham MD Entered By: Linton Ham on 09/30/2020 08:49:19 -------------------------------------------------------------------------------- Progress Note Details Patient Name: Date of Service: Nicholas Sheen EL L. 09/30/2020 8:00 A M Medical Record Number: [id number] Patient Account Number: [id number] Date of Birth/Sex: Treating RN: July 17, 1956 (64 y.o. [name] Primary Care Provider: [name] Other Clinician: Referring Provider: Treating Provider/Extender: Randon Goldsmith in Treatment: 16 Subjective History of Present Illness (HPI) ADMISSION 06/05/2020 This is a 64 year old man who has been on dialysis secondary to diabetes for about 5 years. He was recently seen by Dr. Amalia Hailey I think for routine and prophylactic nail care and foot care. He mentioned that he had in this area on the right lateral leg and he was referred here for review of this. He has been using gentamicin cream on the wound. He  also uses a cam boot for Charcot ankle. He tells me that he does not have a wound on his left foot or ankle. Almost as a side he mentioned that he had a wound on the right gluteal area. He states that this wound had been present since he was hospitalized on 2 different occasions in March. Firstly from 2/28 through 3/6 for Covid and strep bacteremia. He was rehospitalized from 3/9 through 3/12 secondary to hypotension and hypothermia. It was notable during that admission that he had a wound on the right lateral leg they did not mention the area on the right gluteal. In any case the patient is using gentamicin on this area as well his wife changes the dressing. Past medical history; end-stage renal disease on dialysis secondary to type 2 diabetes, hypertension, hyperlipidemia Charcot ankle on the left foot and ankle, he is an ex-smoker 10 years ago remote history of hepatitis C and congestive heart failure. The patient had vascular studies on 05/01/2020 this showed ABIs on the right at 2.06 but with triphasic and biphasic waveforms his TBI was actually elevated at 1.25. He did not allow the cam boot to be removed and therefore arterial studies on the left were not done 8/10-Patient returns to clinic has continued discomfort in the right gluteal ischial area where he has a wound and less discomfort the right lateral leg wound. We have been using Santyl to these areas. He was biopsied at last visit and the results indicate inflamed granulation tissue and vasculopathy-the latter seems consistent with calciphylaxis and given the location of wound not clearly pressure etiology this may well be the case 8/17; I have reviewed the pathology report from the punch biopsies I did a few weeks ago. Calciphylaxis was not seen in these sections there was fibrinoid necrosis at the edges. They suggested clinical correlation and/or additional punch biopsies of calciphylaxis is strongly suspected. We are using Santyl to  the area on the right buttock. As well as an area on the  right leg which I think is a venous insufficiency ulcer, we have been using silver alginate and this is just about healed 8/31; 2-week follow-up. I spoke to his nephrologist after his visit 2 weeks ago we both agreed that thiosulfate at least on a trial basis might be indicated. Although we did not have biopsy-proven calciphylaxis this is certainly clinically what this is. However, apparently the patient states that they have not yet started the thiosulfate at dialysis. He is dialyzing at the [facility]. We have been using Santyl to the wound surface. He has 3 wounds on the right buttock. I think 2 of these have formed since the last time he was here. These were the original satellite lesions I describe last time. the 2 new areas have a completely necrotic surface the other looks somewhat better. The induration and firmness around this area seems somewhat better today. 9/14; 2-week follow-up. He has 3 wounds on the right buttock to the original wound and 2 satellite late lesions. The satellite lesions expanded but have a clean base the original wound is necrotic. He just started on the thiosulfate within the last 2 dialysis sessions. Apparently his nephrologist wants to speak to me about how long to run this. The other worrisome thing here is skin breakdown around the original wound. This looks somewhat angry with a small area loss of epithelialization. Almost looks like a contact dermatitis. 10/7; the patient has not been here in 3 weeks. He still has 3 areas to the right buttock the original wound is now split into 2 and a new area. This is the area I thought was calciphylaxis he apparently was receiving thiosulfate at dialysis in [place]. He arrives with an increasing pain over this area He also has 2 new areas on the left lateral upper leg and a necrotic wound on the tip of the left mid finger. He tells me that finger wound has  been there for about a month although he has not mentioned it in a body. He thinks he was bitten by something 11/2; the patient continues on thiosulfate at dialysis. The areas on his right buttock are considerably better today. I am more convinced than ever that this was calciphylaxis. The last time he was here he had a new wound on the left third finger as well as purulence on the right buttock. The right buttock cultured Klebsiella Proteus and Staph aureus methicillin sensitive, the finger cultured Proteus Morganella and methicillin sensitive staph aureus all sensitive to quinolones I gave him Levaquin postdialysis. This seems to have been satisfactory. His wounds on his legs are healed. The area on the right buttock considerably better. 2 wounds just about healed. He has an area on the left third finger still a small opening but this is considerably better than last time The vascular ultrasound I did of the left upper extremity was normal with triphasic waveforms extending throughout the radial and ulnar arteries extending into the palmar arch. He does not have a vascular issue in his upper extremity. He has never had a shunt in his left arm 11/16; everything is closed here except for the 2 areas that are left on the right buttock. These are smaller. We have been using silver alginate on these wounds Objective Constitutional Sitting or standing Blood Pressure is within target range for patient.. Pulse regular and within target range for patient.Marland Kitchen Respirations regular, non-labored and within target range.. Temperature is normal and within the target range for the patient.Marland Kitchen Appears in no  distress. Vitals Time Taken: 8:20 AM, Height: 73 in, Source: Stated, Weight: 250 lbs, Source: Stated, BMI: 33, Temperature: 98.4 F, Pulse: 81 bpm, Respiratory Rate: 18 breaths/min, Blood Pressure: 104/55 mmHg. General Notes: Wound exam ooWounds over the right ischium x2. These are smaller. Eschar and debris  around the circumference but the wound beds look stable. Epithelializing nicely. No evidence of infection no erythema around the wounds Integumentary (Hair, Skin) Wound #3 status is Open. Original cause of wound was Gradually Appeared. The wound is located on the Right,Distal Gluteus. The wound measures 0.9cm length x 1.4cm width x 0.1cm depth; 0.99cm^2 area and 0.099cm^3 volume. There is Fat Layer (Subcutaneous Tissue) exposed. There is no tunneling or undermining noted. There is a small amount of serosanguineous drainage noted. The wound margin is flat and intact. There is large (67-100%) red granulation within the wound bed. There is no necrotic tissue within the wound bed. Wound #5 status is Open. Original cause of wound was Bite. The wound is located on the Left Hand - 3rd Digit. The wound measures 0cm length x 0cm width x 0cm depth; 0cm^2 area and 0cm^3 volume. There is no tunneling or undermining noted. There is a none present amount of drainage noted. The wound margin is well defined and not attached to the wound base. There is no granulation within the wound bed. There is no necrotic tissue within the wound bed. Wound #7 status is Open. Original cause of wound was Gradually Appeared. The wound is located on the Right,Proximal Gluteus. The wound measures 0.4cm length x 0.4cm width x 0.1cm depth; 0.126cm^2 area and 0.013cm^3 volume. There is Fat Layer (Subcutaneous Tissue) exposed. There is no tunneling or undermining noted. There is a small amount of serosanguineous drainage noted. The wound margin is distinct with the outline attached to the wound base. There is medium (34-66%) red granulation within the wound bed. There is a small (1-33%) amount of necrotic tissue within the wound bed including Adherent Slough. Assessment Active Problems ICD-10 Chronic venous hypertension (idiopathic) with ulcer and inflammation of right lower extremity Pressure ulcer of right buttock,  unstageable Unspecified soft tissue disorder related to use, overuse and pressure, left hand Vasculitis limited to the skin, unspecified Non-pressure chronic ulcer of other part of left lower leg with other specified severity Cutaneous abscess of other sites Type 2 diabetes mellitus with diabetic chronic kidney disease Procedures Wound #3 Pre-procedure diagnosis of Wound #3 is a Pressure Ulcer located on the Right,Distal Gluteus . There was a Selective/Open Wound Skin/Epidermis Debridement with a total area of 1.26 sq cm performed by Ricard Dillon., MD. With the following instrument(s): Curette to remove Viable and Non-Viable tissue/material. Material removed includes Skin: Dermis and Skin: Epidermis and after achieving pain control using Lidocaine 5% topical ointment. No specimens were taken. A time out was conducted at 08:42, prior to the start of the procedure. A Moderate amount of bleeding was controlled with Pressure. The procedure was tolerated well with a pain level of 0 throughout and a pain level of 0 following the procedure. Post Debridement Measurements: 0.9cm length x 1.4cm width x 0.1cm depth; 0.099cm^3 volume. Post debridement Stage noted as Category/Stage III. Character of Wound/Ulcer Post Debridement is improved. Post procedure Diagnosis Wound #3: Same as Pre-Procedure Plan Follow-up Appointments: Return Appointment in 2 weeks. Dressing Change Frequency: Wound #3 Right,Distal Gluteus: Change Dressing every other day. Wound #5 Left Hand - 3rd Digit: Change dressing every day. Wound #7 Right,Proximal Gluteus: Change Dressing every other day. Wound  Cleansing: May shower and wash wound with soap and water. - on days that dressing is changed Primary Wound Dressing: Wound #3 Right,Distal Gluteus: Calcium Alginate with Silver - pack lightly into wound bed. Wound #5 Left Hand - 3rd Digit: Calcium Alginate with Silver Calcium Alginate with Silver - apply neosporin under  alginate. Wound #7 Right,Proximal Gluteus: Calcium Alginate with Silver Secondary Dressing: Wound #3 Right,Distal Gluteus: ABD pad - or bordered foam/bordered gauze. Wound #5 Left Hand - 3rd Digit: Kerlix/Rolled Gauze Dry Gauze Wound #7 Right,Proximal Gluteus: ABD pad - or bordered foam/bordered gauze. #1 continue with silver alginate to both wound areas. #2. This may be closed by the time I see him next time #3. We will need to call dialysis which is associated with the [facility] in [place] to have them stop thiosulfate. I have asked the patient to give me the phone number Electronic Signature(s) Signed: 09/30/2020 4:54:15 PM By: Linton Ham MD Entered By: Linton Ham on 09/30/2020 08:53:40 -------------------------------------------------------------------------------- SuperBill Details Patient Name: Date of Service: Ladona Mow 09/30/2020 Medical Record Number: [id number] Patient Account Number: [id number] Date of Birth/Sex: Treating RN: [birth year] (64 y.o. [name] Primary Care Provider: [name] Other Clinician: Referring Provider: Treating Provider/Extender: Randon Goldsmith in Treatment: 16 Diagnosis Coding ICD-10 Codes Code Description [phone] Chronic venous hypertension (idiopathic) with ulcer and inflammation of right lower extremity L89.310 Pressure ulcer of right buttock, unstageable M70.942 Unspecified soft tissue disorder related to use, overuse and pressure, left hand L95.9 Vasculitis limited to the skin, unspecified L97.828 Non-pressure chronic ulcer of other part of left lower leg with other specified severity L02.818 Cutaneous abscess of other sites E11.22 Type 2 diabetes mellitus with diabetic chronic kidney disease Facility Procedures CPT4 Code: [id number] Description: 62836 - DEBRIDE WOUND 1ST 20 SQ CM OR < ICD-10 Diagnosis Description L89.310 Pressure ulcer of right buttock, unstageable Modifier: Quantity:  1 Physician Procedures : CPT4 Code Description Modifier [id number] - WC PHYS DEBR WO ANESTH 20 SQ CM ICD-10 Diagnosis Description L89.310 Pressure ulcer of right buttock, unstageable Quantity: 1 Electronic Signature(s) Signed: 09/30/2020 4:54:15 PM By: Linton Ham MD Entered By: Linton Ham on 09/30/2020 08:53:51

## 2020-10-02 ENCOUNTER — Other Ambulatory Visit: Payer: Medicare (Managed Care) | Admitting: Orthotics

## 2020-10-13 DIAGNOSIS — E782 Mixed hyperlipidemia: Secondary | ICD-10-CM | POA: Diagnosis not present

## 2020-10-13 DIAGNOSIS — Z8616 Personal history of COVID-19: Secondary | ICD-10-CM | POA: Diagnosis not present

## 2020-10-13 DIAGNOSIS — N186 End stage renal disease: Secondary | ICD-10-CM | POA: Diagnosis not present

## 2020-10-13 DIAGNOSIS — M109 Gout, unspecified: Secondary | ICD-10-CM | POA: Diagnosis not present

## 2020-10-13 DIAGNOSIS — D472 Monoclonal gammopathy: Secondary | ICD-10-CM | POA: Diagnosis not present

## 2020-10-13 DIAGNOSIS — E1122 Type 2 diabetes mellitus with diabetic chronic kidney disease: Secondary | ICD-10-CM | POA: Diagnosis not present

## 2020-10-13 DIAGNOSIS — I700 Atherosclerosis of aorta: Secondary | ICD-10-CM | POA: Diagnosis not present

## 2020-10-13 DIAGNOSIS — I10 Essential (primary) hypertension: Secondary | ICD-10-CM | POA: Diagnosis not present

## 2020-10-13 DIAGNOSIS — E0842 Diabetes mellitus due to underlying condition with diabetic polyneuropathy: Secondary | ICD-10-CM | POA: Diagnosis not present

## 2020-10-13 DIAGNOSIS — Z0000 Encounter for general adult medical examination without abnormal findings: Secondary | ICD-10-CM | POA: Diagnosis not present

## 2020-10-13 DIAGNOSIS — I509 Heart failure, unspecified: Secondary | ICD-10-CM | POA: Diagnosis not present

## 2020-10-14 ENCOUNTER — Encounter (HOSPITAL_BASED_OUTPATIENT_CLINIC_OR_DEPARTMENT_OTHER): Payer: Medicare (Managed Care) | Admitting: Internal Medicine

## 2020-10-14 ENCOUNTER — Other Ambulatory Visit: Payer: Self-pay

## 2020-10-14 DIAGNOSIS — E11621 Type 2 diabetes mellitus with foot ulcer: Secondary | ICD-10-CM | POA: Diagnosis not present

## 2020-10-14 DIAGNOSIS — L89313 Pressure ulcer of right buttock, stage 3: Secondary | ICD-10-CM | POA: Diagnosis not present

## 2020-10-14 NOTE — Progress Notes (Signed)
ANFERNEE, PESCHKE ([id number]) Visit Report for 10/14/2020 Debridement Details Patient Name: Date of Service: [name], [name] 10/14/2020 8:00 A M Medical Record Number: [id number] Patient Account Number: [id number] Date of Birth/Sex: Treating RN: [birth year] (64 y.o. [name]) Carlene Coria Primary Care Provider: [name] Other Clinician: Referring Provider: Treating Provider/Extender: Randon Goldsmith in Treatment: 18 Debridement Performed for Assessment: Wound #3 Right,Distal Gluteus Performed By: Physician Ricard Dillon., MD Debridement Type: Debridement Level of Consciousness (Pre-procedure): Awake and Alert Pre-procedure Verification/Time Out Yes - 08:15 Taken: Start Time: 08:15 Pain Control: Lidocaine 5% topical ointment T Area Debrided (L x W): otal 0.4 (cm) x 1.1 (cm) = 0.44 (cm) Tissue and other material debrided: Viable, Non-Viable, Eschar, Slough, Skin: Dermis , Skin: Epidermis, Slough Level: Skin/Epidermis Debridement Description: Selective/Open Wound Instrument: Curette Bleeding: Moderate Hemostasis Achieved: Pressure End Time: 08:18 Procedural Pain: 0 Post Procedural Pain: 0 Response to Treatment: Procedure was tolerated well Level of Consciousness (Post- Awake and Alert procedure): Post Debridement Measurements of Total Wound Length: (cm) 0.4 Stage: Category/Stage III Width: (cm) 1.1 Depth: (cm) 0.1 Volume: (cm) 0.035 Character of Wound/Ulcer Post Debridement: Improved Post Procedure Diagnosis Same as Pre-procedure Electronic Signature(s) Signed: 10/14/2020 5:54:56 PM By: Linton Ham MD Signed: 10/14/2020 5:56:00 PM By: Carlene Coria RN Entered By: Linton Ham on 10/14/2020 08:21:54 -------------------------------------------------------------------------------- Debridement Details Patient Name: Date of Service: Nicholas Sheen EL L. 10/14/2020 8:00 A M Medical Record Number: [id number] Patient Account Number: [id number] Date  of Birth/Sex: Treating RN: 05/05/1956 (64 y.o. [name] Primary Care Provider: [name] Other Clinician: Referring Provider: Treating Provider/Extender: Randon Goldsmith in Treatment: 18 Debridement Performed for Assessment: Wound #7 Right,Proximal Gluteus Performed By: Physician Ricard Dillon., MD Debridement Type: Debridement Level of Consciousness (Pre-procedure): Awake and Alert Pre-procedure Verification/Time Out Yes - 08:15 Taken: Start Time: 08:15 Pain Control: Lidocaine 5% topical ointment T Area Debrided (L x W): otal 0.3 (cm) x 0.3 (cm) = 0.09 (cm) Tissue and other material debrided: Viable, Non-Viable, Slough, Skin: Dermis , Skin: Epidermis, Slough Level: Skin/Epidermis Debridement Description: Selective/Open Wound Instrument: Curette Bleeding: Moderate Hemostasis Achieved: Pressure End Time: 08:18 Procedural Pain: 0 Post Procedural Pain: 0 Response to Treatment: Procedure was tolerated well Level of Consciousness (Post- Awake and Alert procedure): Post Debridement Measurements of Total Wound Length: (cm) 0.3 Width: (cm) 0.3 Depth: (cm) 0.1 Volume: (cm) 0.007 Character of Wound/Ulcer Post Debridement: Improved Post Procedure Diagnosis Same as Pre-procedure Electronic Signature(s) Signed: 10/14/2020 5:54:56 PM By: Linton Ham MD Signed: 10/14/2020 5:56:00 PM By: Carlene Coria RN Entered By: Linton Ham on 10/14/2020 08:22:03 -------------------------------------------------------------------------------- HPI Details Patient Name: Date of Service: Nicholas Sheen EL L. 10/14/2020 8:00 A M Medical Record Number: [id number] Patient Account Number: [id number] Date of Birth/Sex: Treating RN: July 08, 1956 (64 y.o. [name] Primary Care Provider: [name] Other Clinician: Referring Provider: Treating Provider/Extender: Randon Goldsmith in Treatment: 18 History of Present Illness HPI Description:  ADMISSION 06/05/2020 This is a 64 year old man who has been on dialysis secondary to diabetes for about 5 years. He was recently seen by Dr. Amalia Hailey I think for routine and prophylactic nail care and foot care. He mentioned that he had in this area on the right lateral leg and he was referred here for review of this. He has been using gentamicin cream on the wound. He also uses a cam boot for Charcot ankle. He tells me that he does not have a wound on his left foot or ankle.  Almost as a side he mentioned that he had a wound on the right gluteal area. He states that this wound had been present since he was hospitalized on 2 different occasions in March. Firstly from 2/28 through 3/6 for Covid and strep bacteremia. He was rehospitalized from 3/9 through 3/12 secondary to hypotension and hypothermia. It was notable during that admission that he had a wound on the right lateral leg they did not mention the area on the right gluteal. In any case the patient is using gentamicin on this area as well his wife changes the dressing. Past medical history; end-stage renal disease on dialysis secondary to type 2 diabetes, hypertension, hyperlipidemia Charcot ankle on the left foot and ankle, he is an ex-smoker 10 years ago remote history of hepatitis C and congestive heart failure. The patient had vascular studies on 05/01/2020 this showed ABIs on the right at 2.06 but with triphasic and biphasic waveforms his TBI was actually elevated at 1.25. He did not allow the cam boot to be removed and therefore arterial studies on the left were not done 8/10-Patient returns to clinic has continued discomfort in the right gluteal ischial area where he has a wound and less discomfort the right lateral leg wound. We have been using Santyl to these areas. He was biopsied at last visit and the results indicate inflamed granulation tissue and vasculopathy-the latter seems consistent with calciphylaxis and given the location of wound  not clearly pressure etiology this may well be the case 8/17; I have reviewed the pathology report from the punch biopsies I did a few weeks ago. Calciphylaxis was not seen in these sections there was fibrinoid necrosis at the edges. They suggested clinical correlation and/or additional punch biopsies of calciphylaxis is strongly suspected. We are using Santyl to the area on the right buttock. As well as an area on the right leg which I think is a venous insufficiency ulcer, we have been using silver alginate and this is just about healed 8/31; 2-week follow-up. I spoke to his nephrologist after his visit 2 weeks ago we both agreed that thiosulfate at least on a trial basis might be indicated. Although we did not have biopsy-proven calciphylaxis this is certainly clinically what this is. However, apparently the patient states that they have not yet started the thiosulfate at dialysis. He is dialyzing at the [facility]. We have been using Santyl to the wound surface. He has 3 wounds on the right buttock. I think 2 of these have formed since the last time he was here. These were the original satellite lesions I describe last time. the 2 new areas have a completely necrotic surface the other looks somewhat better. The induration and firmness around this area seems somewhat better today. 9/14; 2-week follow-up. He has 3 wounds on the right buttock to the original wound and 2 satellite late lesions. The satellite lesions expanded but have a clean base the original wound is necrotic. He just started on the thiosulfate within the last 2 dialysis sessions. Apparently his nephrologist wants to speak to me about how long to run this. The other worrisome thing here is skin breakdown around the original wound. This looks somewhat angry with a small area loss of epithelialization. Almost looks like a contact dermatitis. 10/7; the patient has not been here in 3 weeks. He still has 3 areas to the right buttock  the original wound is now split into 2 and a new area. This is the area I thought was  calciphylaxis he apparently was receiving thiosulfate at dialysis in [place]. He arrives with an increasing pain over this area He also has 2 new areas on the left lateral upper leg and a necrotic wound on the tip of the left mid finger. He tells me that finger wound has been there for about a month although he has not mentioned it in a body. He thinks he was bitten by something 11/2; the patient continues on thiosulfate at dialysis. The areas on his right buttock are considerably better today. I am more convinced than ever that this was calciphylaxis. The last time he was here he had a new wound on the left third finger as well as purulence on the right buttock. The right buttock cultured Klebsiella Proteus and Staph aureus methicillin sensitive, the finger cultured Proteus Morganella and methicillin sensitive staph aureus all sensitive to quinolones I gave him Levaquin postdialysis. This seems to have been satisfactory. His wounds on his legs are healed. The area on the right buttock considerably better. 2 wounds just about healed. He has an area on the left third finger still a small opening but this is considerably better than last time The vascular ultrasound I did of the left upper extremity was normal with triphasic waveforms extending throughout the radial and ulnar arteries extending into the palmar arch. He does not have a vascular issue in his upper extremity. He has never had a shunt in his left arm 11/16; everything is closed here except for the 2 areas that are left on the right buttock. These are smaller. We have been using silver alginate on these wounds 11/30 I was hoping these will be closed today however there is still 2 open areas 1 in each of the original sites. We have been using silver alginate and border foam. I believe he is still receiving thiosulfate at dialysis Electronic  Signature(s) Signed: 10/14/2020 5:54:56 PM By: Linton Ham MD Entered By: Linton Ham on 10/14/2020 08:22:40 -------------------------------------------------------------------------------- Physical Exam Details Patient Name: Date of Service: Nicholas Sheen EL L. 10/14/2020 8:00 A M Medical Record Number: [id number] Patient Account Number: [id number] Date of Birth/Sex: Treating RN: 1956-05-09 (64 y.o. [name] Primary Care Provider: [name] Other Clinician: Referring Provider: Treating Provider/Extender: Randon Goldsmith in Treatment: 18 Constitutional Sitting or standing Blood Pressure is within target range for patient.. Pulse regular and within target range for patient.Marland Kitchen Respirations regular, non-labored and within target range.. Temperature is normal and within the target range for the patient.Marland Kitchen Appears in no distress. Notes Wound exam Small open areas remain on the right ischium x2. Debris over the surface including dry skin callus slough. I removed with a #3 curette. The 2 wound areas are still in close juxtaposition but they are considerably better than what we had initially and these have really done nicely. There is still firmness around the wounds which I think is beyond scar tissue however even this seems better there is still some tenderness however. Absolutely no evidence of Electronic Signature(s) Signed: 10/14/2020 5:54:56 PM By: Linton Ham MD Entered By: Linton Ham on 10/14/2020 08:23:56 -------------------------------------------------------------------------------- Physician Orders Details Patient Name: Date of Service: [name]. 10/14/2020 8:00 A M Medical Record Number: [id number] Patient Account Number: [id number] Date of Birth/Sex: Treating RN: [birth year] (64 y.o. [name] Primary Care Provider: [name] Other Clinician: Referring Provider: Treating Provider/Extender: Randon Goldsmith in Treatment: [phone] Verbal / Phone Orders: No Diagnosis Coding ICD-10 Coding Code Description [phone]  Chronic venous hypertension (idiopathic) with ulcer and inflammation of right lower extremity L89.310 Pressure ulcer of right buttock, unstageable M70.942 Unspecified soft tissue disorder related to use, overuse and pressure, left hand L95.9 Vasculitis limited to the skin, unspecified L97.828 Non-pressure chronic ulcer of other part of left lower leg with other specified severity L02.818 Cutaneous abscess of other sites E11.22 Type 2 diabetes mellitus with diabetic chronic kidney disease Follow-up Appointments Return Appointment in 2 weeks. Dressing Change Frequency Wound #3 Right,Distal Gluteus Change Dressing every other day. Wound Cleansing May shower and wash wound with soap and water. - on days that dressing is changed Primary Wound Dressing Wound #3 Right,Distal Gluteus lginate with Silver - pack lightly into wound bed. Calcium A Wound #7 Right,Proximal Gluteus lginate with Silver - pack lightly into wound bed. Calcium A Secondary Dressing Wound #3 Right,Distal Gluteus ABD pad - or bordered foam/bordered gauze. Wound #7 Right,Proximal Gluteus ABD pad - or bordered foam/bordered gauze. Electronic Signature(s) Signed: 10/14/2020 5:54:56 PM By: Linton Ham MD Signed: 10/14/2020 5:56:00 PM By: Carlene Coria RN Entered By: Carlene Coria on 10/14/2020 08:21:06 -------------------------------------------------------------------------------- Problem List Details Patient Name: Date of Service: Nicholas Sheen EL L. 10/14/2020 8:00 A M Medical Record Number: [id number] Patient Account Number: [id number] Date of Birth/Sex: Treating RN: 1956/02/17 (64 y.o. [name]) Carlene Coria Primary Care Provider: [name] Other Clinician: Referring Provider: Treating Provider/Extender: Randon Goldsmith in Treatment: 18 Active Problems ICD-10 Encounter Code  Description Active Date MDM Diagnosis I87.331 Chronic venous hypertension (idiopathic) with ulcer and inflammation of right 06/05/2020 No Yes lower extremity L89.310 Pressure ulcer of right buttock, unstageable 06/05/2020 No Yes M70.942 Unspecified soft tissue disorder related to use, overuse and pressure, left 08/21/2020 No Yes hand L95.9 Vasculitis limited to the skin, unspecified 06/24/2020 No Yes L02.818 Cutaneous abscess of other sites 08/21/2020 No Yes E11.22 Type 2 diabetes mellitus with diabetic chronic kidney disease 06/05/2020 No Yes Inactive Problems ICD-10 Code Description Active Date Inactive Date L97.212 Non-pressure chronic ulcer of right calf with fat layer exposed 06/05/2020 06/05/2020 L97.828 Non-pressure chronic ulcer of other part of left lower leg with other specified severity 08/21/2020 08/21/2020 Resolved Problems Electronic Signature(s) Signed: 10/14/2020 5:54:56 PM By: Linton Ham MD Entered By: Linton Ham on 10/14/2020 08:20:41 -------------------------------------------------------------------------------- Progress Note Details Patient Name: Date of Service: Nicholas Sheen EL L. 10/14/2020 8:00 A M Medical Record Number: [id number] Patient Account Number: [id number] Date of Birth/Sex: Treating RN: 04-02-1956 (64 y.o. [name] Primary Care Provider: [name] Other Clinician: Referring Provider: Treating Provider/Extender: Randon Goldsmith in Treatment: 18 Subjective History of Present Illness (HPI) ADMISSION 06/05/2020 This is a 64 year old man who has been on dialysis secondary to diabetes for about 5 years. He was recently seen by Dr. Amalia Hailey I think for routine and prophylactic nail care and foot care. He mentioned that he had in this area on the right lateral leg and he was referred here for review of this. He has been using gentamicin cream on the wound. He also uses a cam boot for Charcot ankle. He tells me that he does not  have a wound on his left foot or ankle. Almost as a side he mentioned that he had a wound on the right gluteal area. He states that this wound had been present since he was hospitalized on 2 different occasions in March. Firstly from 2/28 through 3/6 for Covid and strep bacteremia. He was rehospitalized from 3/9 through 3/12 secondary to hypotension and hypothermia. It was  notable during that admission that he had a wound on the right lateral leg they did not mention the area on the right gluteal. In any case the patient is using gentamicin on this area as well his wife changes the dressing. Past medical history; end-stage renal disease on dialysis secondary to type 2 diabetes, hypertension, hyperlipidemia Charcot ankle on the left foot and ankle, he is an ex-smoker 10 years ago remote history of hepatitis C and congestive heart failure. The patient had vascular studies on 05/01/2020 this showed ABIs on the right at 2.06 but with triphasic and biphasic waveforms his TBI was actually elevated at 1.25. He did not allow the cam boot to be removed and therefore arterial studies on the left were not done 8/10-Patient returns to clinic has continued discomfort in the right gluteal ischial area where he has a wound and less discomfort the right lateral leg wound. We have been using Santyl to these areas. He was biopsied at last visit and the results indicate inflamed granulation tissue and vasculopathy-the latter seems consistent with calciphylaxis and given the location of wound not clearly pressure etiology this may well be the case 8/17; I have reviewed the pathology report from the punch biopsies I did a few weeks ago. Calciphylaxis was not seen in these sections there was fibrinoid necrosis at the edges. They suggested clinical correlation and/or additional punch biopsies of calciphylaxis is strongly suspected. We are using Santyl to the area on the right buttock. As well as an area on the right leg which  I think is a venous insufficiency ulcer, we have been using silver alginate and this is just about healed 8/31; 2-week follow-up. I spoke to his nephrologist after his visit 2 weeks ago we both agreed that thiosulfate at least on a trial basis might be indicated. Although we did not have biopsy-proven calciphylaxis this is certainly clinically what this is. However, apparently the patient states that they have not yet started the thiosulfate at dialysis. He is dialyzing at the [facility]. We have been using Santyl to the wound surface. He has 3 wounds on the right buttock. I think 2 of these have formed since the last time he was here. These were the original satellite lesions I describe last time. the 2 new areas have a completely necrotic surface the other looks somewhat better. The induration and firmness around this area seems somewhat better today. 9/14; 2-week follow-up. He has 3 wounds on the right buttock to the original wound and 2 satellite late lesions. The satellite lesions expanded but have a clean base the original wound is necrotic. He just started on the thiosulfate within the last 2 dialysis sessions. Apparently his nephrologist wants to speak to me about how long to run this. The other worrisome thing here is skin breakdown around the original wound. This looks somewhat angry with a small area loss of epithelialization. Almost looks like a contact dermatitis. 10/7; the patient has not been here in 3 weeks. He still has 3 areas to the right buttock the original wound is now split into 2 and a new area. This is the area I thought was calciphylaxis he apparently was receiving thiosulfate at dialysis in [place]. He arrives with an increasing pain over this area He also has 2 new areas on the left lateral upper leg and a necrotic wound on the tip of the left mid finger. He tells me that finger wound has been there for about a month although he has  not mentioned it in a body. He  thinks he was bitten by something 11/2; the patient continues on thiosulfate at dialysis. The areas on his right buttock are considerably better today. I am more convinced than ever that this was calciphylaxis. The last time he was here he had a new wound on the left third finger as well as purulence on the right buttock. The right buttock cultured Klebsiella Proteus and Staph aureus methicillin sensitive, the finger cultured Proteus Morganella and methicillin sensitive staph aureus all sensitive to quinolones I gave him Levaquin postdialysis. This seems to have been satisfactory. His wounds on his legs are healed. The area on the right buttock considerably better. 2 wounds just about healed. He has an area on the left third finger still a small opening but this is considerably better than last time The vascular ultrasound I did of the left upper extremity was normal with triphasic waveforms extending throughout the radial and ulnar arteries extending into the palmar arch. He does not have a vascular issue in his upper extremity. He has never had a shunt in his left arm 11/16; everything is closed here except for the 2 areas that are left on the right buttock. These are smaller. We have been using silver alginate on these wounds 11/30 I was hoping these will be closed today however there is still 2 open areas 1 in each of the original sites. We have been using silver alginate and border foam. I believe he is still receiving thiosulfate at dialysis Objective Constitutional Sitting or standing Blood Pressure is within target range for patient.. Pulse regular and within target range for patient.Marland Kitchen Respirations regular, non-labored and within target range.. Temperature is normal and within the target range for the patient.Marland Kitchen Appears in no distress. Vitals Time Taken: 7:58 AM, Height: 73 in, Weight: 250 lbs, BMI: 33, Temperature: 98.4 F, Pulse: 87 bpm, Respiratory Rate: 18 breaths/min, Blood  Pressure: 115/73 mmHg. General Notes: Wound exam ooSmall open areas remain on the right ischium x2. Debris over the surface including dry skin callus slough. I removed with a #3 curette. The 2 wound areas are still in close juxtaposition but they are considerably better than what we had initially and these have really done nicely. There is still firmness around the wounds which I think is beyond scar tissue however even this seems better there is still some tenderness however. Absolutely no evidence of Integumentary (Hair, Skin) Wound #3 status is Open. Original cause of wound was Gradually Appeared. The wound is located on the Right,Distal Gluteus. The wound measures 0.4cm length x 1.1cm width x 0.1cm depth; 0.346cm^2 area and 0.035cm^3 volume. There is Fat Layer (Subcutaneous Tissue) exposed. There is no tunneling or undermining noted. There is a medium amount of serosanguineous drainage noted. The wound margin is flat and intact. There is large (67-100%) red granulation within the wound bed. There is a small (1-33%) amount of necrotic tissue within the wound bed including Adherent Slough. Wound #5 status is Healed - Epithelialized. Original cause of wound was Bite. The wound is located on the Left Hand - 3rd Digit. The wound measures 0cm length x 0cm width x 0cm depth; 0cm^2 area and 0cm^3 volume. Wound #7 status is Open. Original cause of wound was Gradually Appeared. The wound is located on the Right,Proximal Gluteus. The wound measures 0.3cm length x 0.3cm width x 0.1cm depth; 0.071cm^2 area and 0.007cm^3 volume. There is Fat Layer (Subcutaneous Tissue) exposed. There is no tunneling or undermining noted. There  is a small amount of serosanguineous drainage noted. The wound margin is distinct with the outline attached to the wound base. There is medium (34-66%) red granulation within the wound bed. There is a small (1-33%) amount of necrotic tissue within the wound bed including Adherent  Slough. Assessment Active Problems ICD-10 Chronic venous hypertension (idiopathic) with ulcer and inflammation of right lower extremity Pressure ulcer of right buttock, unstageable Unspecified soft tissue disorder related to use, overuse and pressure, left hand Vasculitis limited to the skin, unspecified Cutaneous abscess of other sites Type 2 diabetes mellitus with diabetic chronic kidney disease Procedures Wound #3 Pre-procedure diagnosis of Wound #3 is a Pressure Ulcer located on the Right,Distal Gluteus . There was a Selective/Open Wound Skin/Epidermis Debridement with a total area of 0.44 sq cm performed by Ricard Dillon., MD. With the following instrument(s): Curette to remove Viable and Non-Viable tissue/material. Material removed includes Eschar, Slough, Skin: Dermis, and Skin: Epidermis after achieving pain control using Lidocaine 5% topical ointment. No specimens were taken. A time out was conducted at 08:15, prior to the start of the procedure. A Moderate amount of bleeding was controlled with Pressure. The procedure was tolerated well with a pain level of 0 throughout and a pain level of 0 following the procedure. Post Debridement Measurements: 0.4cm length x 1.1cm width x 0.1cm depth; 0.035cm^3 volume. Post debridement Stage noted as Category/Stage III. Character of Wound/Ulcer Post Debridement is improved. Post procedure Diagnosis Wound #3: Same as Pre-Procedure Wound #7 Pre-procedure diagnosis of Wound #7 is an Atypical located on the Right,Proximal Gluteus . There was a Selective/Open Wound Skin/Epidermis Debridement with a total area of 0.09 sq cm performed by Ricard Dillon., MD. With the following instrument(s): Curette to remove Viable and Non-Viable tissue/material. Material removed includes Slough, Skin: Dermis, and Skin: Epidermis after achieving pain control using Lidocaine 5% topical ointment. No specimens were taken. A time out was conducted at 08:15, prior to  the start of the procedure. A Moderate amount of bleeding was controlled with Pressure. The procedure was tolerated well with a pain level of 0 throughout and a pain level of 0 following the procedure. Post Debridement Measurements: 0.3cm length x 0.3cm width x 0.1cm depth; 0.007cm^3 volume. Character of Wound/Ulcer Post Debridement is improved. Post procedure Diagnosis Wound #7: Same as Pre-Procedure Plan Follow-up Appointments: Return Appointment in 2 weeks. Dressing Change Frequency: Wound #3 Right,Distal Gluteus: Change Dressing every other day. Wound Cleansing: May shower and wash wound with soap and water. - on days that dressing is changed Primary Wound Dressing: Wound #3 Right,Distal Gluteus: Calcium Alginate with Silver - pack lightly into wound bed. Wound #7 Right,Proximal Gluteus: Calcium Alginate with Silver - pack lightly into wound bed. Secondary Dressing: Wound #3 Right,Distal Gluteus: ABD pad - or bordered foam/bordered gauze. Wound #7 Right,Proximal Gluteus: ABD pad - or bordered foam/bordered gauze. 1. I am continuing with silver alginate and ABDs change every 2d 2. These really look like they are going to close over especially after that a surface debridement. If not I may recommend polymen Electronic Signature(s) Signed: 10/14/2020 5:54:56 PM By: Linton Ham MD Entered By: Linton Ham on 10/14/2020 08:24:51 -------------------------------------------------------------------------------- SuperBill Details Patient Name: Date of Service: [name] 10/14/2020 Medical Record Number: [id number] Patient Account Number: [id number] Date of Birth/Sex: Treating RN: 04/11/1956 (64 y.o. [name] Primary Care Provider: [name] Other Clinician: Referring Provider: Treating Provider/Extender: Randon Goldsmith in Treatment: 18 Diagnosis Coding ICD-10 Codes Code Description [phone]  Chronic venous hypertension (idiopathic)  with ulcer and inflammation of right lower extremity L89.310 Pressure ulcer of right buttock, unstageable M70.942 Unspecified soft tissue disorder related to use, overuse and pressure, left hand L95.9 Vasculitis limited to the skin, unspecified L02.818 Cutaneous abscess of other sites E11.22 Type 2 diabetes mellitus with diabetic chronic kidney disease Facility Procedures CPT4 Code: [id number] Description: 89373 - DEBRIDE WOUND 1ST 20 SQ CM OR < ICD-10 Diagnosis Description L89.310 Pressure ulcer of right buttock, unstageable Modifier: Quantity: 1 Physician Procedures : CPT4 Code Description Modifier [id number] - WC PHYS DEBR WO ANESTH 20 SQ CM ICD-10 Diagnosis Description L89.310 Pressure ulcer of right buttock, unstageable Quantity: 1 Electronic Signature(s) Signed: 10/14/2020 5:54:56 PM By: Linton Ham MD Entered By: Linton Ham on 10/14/2020 08:25:07

## 2020-10-14 NOTE — Progress Notes (Signed)
[name] ([id number]) Visit Report for 10/14/2020 Arrival Information Details Patient Name: Date of Service: [name] 10/14/2020 8:00 A M Medical Record Number: [id number] Patient Account Number: [id number] Date of Birth/Sex: Treating RN: 1956/02/07 (64 y.o. Hessie Diener Primary Care Yoshi Vicencio: [name] Other Clinician: Referring Mirissa Lopresti: Treating Jazir Newey/Extender: Randon Goldsmith in Treatment: 18 Visit Information History Since Last Visit Added or deleted any medications: No Patient Arrived: Ambulatory Any new allergies or adverse reactions: No Arrival Time: 07:55 Had a fall or experienced change in No Accompanied By: self activities of daily living that may affect Transfer Assistance: None risk of falls: Patient Identification Verified: Yes Signs or symptoms of abuse/neglect since last visito No Secondary Verification Process Completed: Yes Hospitalized since last visit: No Patient Requires Transmission-Based Precautions: No Implantable device outside of the clinic excluding No Patient Has Alerts: Yes cellular tissue based products placed in the center Patient Alerts: Right ABI/TBI: 1.35 since last visit: Has Dressing in Place as Prescribed: Yes Pain Present Now: No Electronic Signature(s) Signed: 10/14/2020 5:54:32 PM By: Deon Pilling Entered By: Deon Pilling on 10/14/2020 08:02:48 -------------------------------------------------------------------------------- Encounter Discharge Information Details Patient Name: Date of Service: [name] Nicholas L. 10/14/2020 8:00 A M Medical Record Number: [id number] Patient Account Number: [id number] Date of Birth/Sex: Treating RN: 1956-09-04 (64 y.o. Hessie Diener Primary Care Aujanae Mccullum: [name] Other Clinician: Referring Haylin Camilli: Treating Choua Ikner/Extender: Randon Goldsmith in Treatment: 18 Encounter Discharge Information Items Post Procedure  Vitals Discharge Condition: Stable Temperature (F): 98.4 Ambulatory Status: Ambulatory Pulse (bpm): 87 Discharge Destination: Home Respiratory Rate (breaths/min): 18 Transportation: Private Auto Blood Pressure (mmHg): 115/73 Accompanied By: self Schedule Follow-up Appointment: Yes Clinical Summary of Care: Electronic Signature(s) Signed: 10/14/2020 5:54:32 PM By: Deon Pilling Entered By: Deon Pilling on 10/14/2020 08:27:54 -------------------------------------------------------------------------------- Lower Extremity Assessment Details Patient Name: Date of Service: [name], MURLEY Nicholas L. 10/14/2020 8:00 A M Medical Record Number: [id number] Patient Account Number: [id number] Date of Birth/Sex: Treating RN: [birth year] (64 y.o. Hessie Diener Primary Care Nabila Albarracin: [name] Other Clinician: Referring Armstead Heiland: Treating Kianah Harries/Extender: Randon Goldsmith in Treatment: 18 Electronic Signature(s) Signed: 10/14/2020 5:54:32 PM By: Deon Pilling Entered By: Deon Pilling on 10/14/2020 08:03:29 -------------------------------------------------------------------------------- Multi Wound Chart Details Patient Name: Date of Service: [name] Nicholas L. 10/14/2020 8:00 A M Medical Record Number: [id number] Patient Account Number: [id number] Date of Birth/Sex: Treating RN: 1955/12/03 (64 y.o. [name] Primary Care Yousuf Ager: [name] Other Clinician: Referring Leontine Radman: Treating Hildegard Hlavac/Extender: Randon Goldsmith in Treatment: 18 Vital Signs Height(in): 66 Pulse(bpm): 55 Weight(lbs): 250 Blood Pressure(mmHg): 115/73 Body Mass Index(BMI): 33 Temperature(F): 98.4 Respiratory Rate(breaths/min): 18 Photos: [3:No Photos Right, Distal Gluteus] [5:No Photos Left Hand - 3rd Digit] [7:No Photos Right, Proximal Gluteus] Wound Location: [3:Gradually Appeared] [5:Bite] [7:Gradually Appeared] Wounding Event: [3:Pressure Ulcer]  [5:Arterial Insufficiency Ulcer] [7:Atypical] Primary Etiology: [3:Cataracts, Sleep Apnea, Congestive] [5:N/A] [7:Cataracts, Sleep Apnea, Congestive] Comorbid History: [3:Heart Failure, Hypertension, Hepatitis C, Type II Diabetes, End Stage Renal Disease, Neuropathy 03/15/2020] [5:08/07/2020] [7:Heart Failure, Hypertension, Hepatitis C, Type II Diabetes, End Stage Renal Disease, Neuropathy 08/15/2020] Date Acquired: [3:18] [5:7] [7:7] Weeks of Treatment: [3:Open] [5:Healed - Epithelialized] [7:Open] Wound Status: [3:0.4x1.1x0.1] [5:0x0x0] [7:0.3x0.3x0.1] Measurements L x W x D (cm) [3:0.346] [5:0] [7:0.071] A (cm) : rea [3:0.035] [5:0] [7:0.007] Volume (cm) : [3:98.50%] [5:100.00%] [7:98.60%] % Reduction in Area: [3:99.90%] [5:100.00%] [7:99.80%] % Reduction in Volume: [3:Category/Stage III] [5:Full Thickness With Exposed Support Full Thickness Without Exposed] Classification: [3:Medium] [  5:Structures N/A] [7:Support Structures Small] Exudate Amount: [3:Serosanguineous] [5:N/A] [7:Serosanguineous] Exudate Type: [3:red, brown] [5:N/A] [7:red, brown] Exudate Color: [3:Flat and Intact] [5:N/A] [7:Distinct, outline attached] Wound Margin: [3:Large (67-100%)] [5:N/A] [7:Medium (34-66%)] Granulation Amount: [3:Red] [5:N/A] [7:Red] Granulation Quality: [3:Small (1-33%)] [5:N/A] [7:Small (1-33%)] Necrotic Amount: [3:Fat Layer (Subcutaneous Tissue): Yes N/A] [7:Fat Layer (Subcutaneous Tissue): Yes] Exposed Structures: [3:Fascia: No Tendon: No Muscle: No Joint: No Bone: No Large (67-100%)] [5:N/A] [7:Fascia: No Tendon: No Muscle: No Joint: No Bone: No Small (1-33%)] Epithelialization: [3:Debridement - Selective/Open Wound N/A] [7:Debridement - Selective/Open Wound] Debridement: [3:08:15] [5:N/A] [7:08:15] Pre-procedure Verification/Time Out Taken: [3:Lidocaine 5% topical ointment] [5:N/A] [7:Lidocaine 5% topical ointment] Pain Control: [3:Slough] [5:N/A] [7:Slough] Tissue Debrided:  [3:Skin/Epidermis] [5:N/A] [7:Skin/Epidermis] Level: [3:0.44] [5:N/A] [7:0.09] Debridement A (sq cm): [3:rea Curette] [5:N/A] [7:Curette] Instrument: [3:Moderate] [5:N/A] [7:Moderate] Bleeding: [3:Pressure] [5:N/A] [7:Pressure] Hemostasis A chieved: [3:0] [5:N/A] [7:0] Procedural Pain: [3:0] [5:N/A] [7:0] Post Procedural Pain: [3:Procedure was tolerated well] [5:N/A] [7:Procedure was tolerated well] Debridement Treatment Response: [3:0.4x1.1x0.1] [5:N/A] [7:0.3x0.3x0.1] Post Debridement Measurements L x W x D (cm) [3:0.035] [5:N/A] [7:0.007] Post Debridement Volume: (cm) [3:Category/Stage III] [5:N/A] [7:N/A] Post Debridement Stage: [3:Debridement] [5:N/A] [7:Debridement] Treatment Notes Electronic Signature(s) Signed: 10/14/2020 5:54:56 PM By: Linton Ham MD Signed: 10/14/2020 5:56:00 PM By: Carlene Coria RN Entered By: Linton Ham on 10/14/2020 08:21:38 -------------------------------------------------------------------------------- Multi-Disciplinary Care Plan Details Patient Name: Date of Service: [name] Nicholas L. 10/14/2020 8:00 A M Medical Record Number: [id number] Patient Account Number: [id number] Date of Birth/Sex: Treating RN: 09/17/1956 (64 y.o. [name] Primary Care Melaine Mcphee: [name] Other Clinician: Referring Clifford Coudriet: Treating Willodene Stallings/Extender: Randon Goldsmith in Treatment: 18 Active Inactive Wound/Skin Impairment Nursing Diagnoses: Impaired tissue integrity Knowledge deficit related to ulceration/compromised skin integrity Goals: Patient/caregiver will verbalize understanding of skin care regimen Date Initiated: 06/05/2020 Target Resolution Date: 11/12/2020 Goal Status: Active Interventions: Assess patient/caregiver ability to obtain necessary supplies Assess patient/caregiver ability to perform ulcer/skin care regimen upon admission and as needed Assess ulceration(s) every visit Provide education on ulcer and skin  care Notes: Electronic Signature(s) Signed: 10/14/2020 5:56:00 PM By: Carlene Coria RN Entered By: Carlene Coria on 10/14/2020 08:09:27 -------------------------------------------------------------------------------- Pain Assessment Details Patient Name: Date of Service: [name] Nicholas L. 10/14/2020 8:00 A M Medical Record Number: [id number] Patient Account Number: [id number] Date of Birth/Sex: Treating RN: November 17, 1955 (64 y.o. Hessie Diener Primary Care Nichols Corter: [name] Other Clinician: Referring Chandra Feger: Treating Aneshia Jacquet/Extender: Randon Goldsmith in Treatment: 18 Active Problems Location of Pain Severity and Description of Pain Patient Has Paino Yes Site Locations Pain Location: Generalized Pain Rate the pain. Current Pain Level: 6 Worst Pain Level: 10 Least Pain Level: 0 Tolerable Pain Level: 8 Pain Management and Medication Current Pain Management: Medication: Yes Cold Application: No Rest: Yes Massage: No Activity: No T.E.N.S.: No Heat Application: No Leg drop or elevation: No Is the Current Pain Management Adequate: Inadequate How does your wound impact your activities of daily livingo Sleep: No Bathing: No Appetite: No Relationship With Others: No Bladder Continence: No Emotions: No Bowel Continence: No Work: No Toileting: No Drive: No Dressing: No Hobbies: No Electronic Signature(s) Signed: 10/14/2020 5:54:32 PM By: Deon Pilling Entered By: Deon Pilling on 10/14/2020 08:03:21 -------------------------------------------------------------------------------- Patient/Caregiver Education Details Patient Name: Date of Service: Ladona Mow 11/30/2021andnbsp8:00 [place] Record Number: [id number] Patient Account Number: [id number] Date of Birth/Gender: Treating RN: [birth year] (64 y.o. [name] Primary Care Physician: [name] Other Clinician: Referring Physician: Treating Physician/Extender: Buel Ream,  Bobbye Riggs in Treatment: 18 Education Assessment Education Provided To: Patient Education Topics Provided Wound/Skin Impairment: Methods: Explain/Verbal Responses: State content correctly [employer]) Signed: 10/14/2020 5:56:00 PM By: Carlene Coria RN Entered By: Carlene Coria on 10/14/2020 08:09:41 -------------------------------------------------------------------------------- Wound Assessment Details Patient Name: Date of Service: LAFAYETTE, DUNLEVY Nicholas L. 10/14/2020 8:00 A M Medical Record Number: [id number] Patient Account Number: [id number] Date of Birth/Sex: Treating RN: 04-19-1956 (64 y.o. Lorette Ang, Meta.Reding Primary Care Navjot Loera: [name] Other Clinician: Referring Darlisa Spruiell: Treating Ciani Rutten/Extender: Randon Goldsmith in Treatment: 18 Wound Status Wound Number: 3 Primary Pressure Ulcer Etiology: Wound Location: Right, Distal Gluteus Wound Open Wounding Event: Gradually Appeared Status: Date Acquired: 03/15/2020 Comorbid Cataracts, Sleep Apnea, Congestive Heart Failure, Hypertension, Weeks Of Treatment: 18 History: Hepatitis C, Type II Diabetes, End Stage Renal Disease, Clustered Wound: No Neuropathy Wound Measurements Length: (cm) 0.4 Width: (cm) 1.1 Depth: (cm) 0.1 Area: (cm) 0.346 Volume: (cm) 0.035 % Reduction in Area: 98.5% % Reduction in Volume: 99.9% Epithelialization: Large (67-100%) Tunneling: No Undermining: No Wound Description Classification: Category/Stage III Wound Margin: Flat and Intact Exudate Amount: Medium Exudate Type: Serosanguineous Exudate Color: red, brown Foul Odor After Cleansing: No Slough/Fibrino Yes Wound Bed Granulation Amount: Large (67-100%) Exposed Structure Granulation Quality: Red Fascia Exposed: No Necrotic Amount: Small (1-33%) Fat Layer (Subcutaneous Tissue) Exposed: Yes Necrotic Quality: Adherent Slough Tendon Exposed: No Muscle Exposed: No Joint Exposed: No Bone  Exposed: No Treatment Notes Wound #3 (Right, Distal Gluteus) 1. Cleanse With Wound Cleanser 2. Periwound Care Skin Prep 3. Primary Dressing Applied Calcium Alginate Ag 4. Secondary Dressing Foam Border Dressing 5. Secured With Office manager) Signed: 10/14/2020 5:54:32 PM By: Deon Pilling Entered By: Deon Pilling on 10/14/2020 08:04:35 -------------------------------------------------------------------------------- Wound Assessment Details Patient Name: Date of Service: DEITRICH, STEVE Nicholas L. 10/14/2020 8:00 A M Medical Record Number: [id number] Patient Account Number: [id number] Date of Birth/Sex: Treating RN: 1956/02/12 (64 y.o. Lorette Ang, Meta.Reding Primary Care Donovin Kraemer: [name] Other Clinician: Referring Kingston Guiles: Treating Luanna Weesner/Extender: Randon Goldsmith in Treatment: 18 Wound Status Wound Number: 5 Primary Etiology: Arterial Insufficiency Ulcer Wound Location: Left Hand - 3rd Digit Wound Status: Healed - Epithelialized Wounding Event: Bite Date Acquired: 08/07/2020 Weeks Of Treatment: 7 Clustered Wound: No Wound Measurements Length: (cm) Width: (cm) Depth: (cm) Area: (cm) Volume: (cm) 0 % Reduction in Area: 100% 0 % Reduction in Volume: 100% 0 0 0 Wound Description Classification: Full Thickness With Exposed Support Structures Electronic Signature(s) Signed: 10/14/2020 5:54:32 PM By: Deon Pilling Entered By: Deon Pilling on 10/14/2020 08:03:39 -------------------------------------------------------------------------------- Wound Assessment Details Patient Name: Date of Service: [name] Nicholas L. 10/14/2020 8:00 A M Medical Record Number: [id number] Patient Account Number: [id number] Date of Birth/Sex: Treating RN: 1956/01/22 (64 y.o. Lorette Ang, Meta.Reding Primary Care Arlen Dupuis: [name] Other Clinician: Referring Arionne Iams: Treating Cas Tracz/Extender: Randon Goldsmith in  Treatment: 18 Wound Status Wound Number: 7 Primary Atypical Etiology: Wound Location: Right, Proximal Gluteus Wound Open Wounding Event: Gradually Appeared Status: Status: Date Acquired: 08/15/2020 Comorbid Cataracts, Sleep Apnea, Congestive Heart Failure, Hypertension, Weeks Of Treatment: 7 History: Hepatitis C, Type II Diabetes, End Stage Renal Disease, Clustered Wound: No Neuropathy Wound Measurements Length: (cm) 0.3 Width: (cm) 0.3 Depth: (cm) 0.1 Area: (cm) 0.071 Volume: (cm) 0.007 % Reduction in Area: 98.6% % Reduction in Volume: 99.8% Epithelialization: Small (1-33%) Tunneling: No Undermining: No Wound Description Classification: Full Thickness Without Exposed Support Structures Wound Margin: Distinct, outline attached Exudate Amount: Small Exudate Type: Serosanguineous  Exudate Color: red, brown Foul Odor After Cleansing: No Slough/Fibrino Yes Wound Bed Granulation Amount: Medium (34-66%) Exposed Structure Granulation Quality: Red Fascia Exposed: No Necrotic Amount: Small (1-33%) Fat Layer (Subcutaneous Tissue) Exposed: Yes Necrotic Quality: Adherent Slough Tendon Exposed: No Muscle Exposed: No Joint Exposed: No Bone Exposed: No Treatment Notes Wound #7 (Right, Proximal Gluteus) 1. Cleanse With Wound Cleanser 2. Periwound Care Skin Prep 3. Primary Dressing Applied Calcium Alginate Ag 4. Secondary Dressing Foam Border Dressing 5. Secured With Office manager) Signed: 10/14/2020 5:54:32 PM By: Deon Pilling Signed: 10/14/2020 5:56:00 PM By: Carlene Coria RN Entered By: Carlene Coria on 10/14/2020 08:17:56 -------------------------------------------------------------------------------- Vitals Details Patient Name: Date of Service: [name] Nicholas L. 10/14/2020 8:00 A M Medical Record Number: [id number] Patient Account Number: [id number] Date of Birth/Sex: Treating RN: 08-Nov-1956 (64 y.o. Lorette Ang, Meta.Reding Primary Care  Fredderick Swanger: [name] Other Clinician: Referring Javayah Magaw: Treating Dietrich Ke/Extender: Randon Goldsmith in Treatment: 18 Vital Signs Time Taken: 07:58 Temperature (F): 98.4 Height (in): 73 Pulse (bpm): 87 Weight (lbs): 250 Respiratory Rate (breaths/min): 18 Body Mass Index (BMI): 33 Blood Pressure (mmHg): 115/73 Reference Range: 80 - 120 mg / dl Electronic Signature(s) Signed: 10/14/2020 5:54:32 PM By: Deon Pilling Entered By: Deon Pilling on 10/14/2020 08:03:02

## 2020-10-28 ENCOUNTER — Other Ambulatory Visit: Payer: Self-pay

## 2020-10-28 ENCOUNTER — Encounter (HOSPITAL_BASED_OUTPATIENT_CLINIC_OR_DEPARTMENT_OTHER): Payer: Medicare (Managed Care) | Attending: Internal Medicine | Admitting: Internal Medicine

## 2020-10-28 DIAGNOSIS — L98412 Non-pressure chronic ulcer of buttock with fat layer exposed: Secondary | ICD-10-CM | POA: Diagnosis not present

## 2020-10-28 DIAGNOSIS — L89310 Pressure ulcer of right buttock, unstageable: Secondary | ICD-10-CM | POA: Diagnosis not present

## 2020-10-28 DIAGNOSIS — I132 Hypertensive heart and chronic kidney disease with heart failure and with stage 5 chronic kidney disease, or end stage renal disease: Secondary | ICD-10-CM | POA: Diagnosis not present

## 2020-10-28 DIAGNOSIS — E1122 Type 2 diabetes mellitus with diabetic chronic kidney disease: Secondary | ICD-10-CM | POA: Insufficient documentation

## 2020-10-28 DIAGNOSIS — Z992 Dependence on renal dialysis: Secondary | ICD-10-CM | POA: Diagnosis not present

## 2020-10-28 DIAGNOSIS — I509 Heart failure, unspecified: Secondary | ICD-10-CM | POA: Diagnosis not present

## 2020-10-28 DIAGNOSIS — E11622 Type 2 diabetes mellitus with other skin ulcer: Secondary | ICD-10-CM | POA: Insufficient documentation

## 2020-10-28 DIAGNOSIS — N186 End stage renal disease: Secondary | ICD-10-CM | POA: Diagnosis not present

## 2020-10-28 DIAGNOSIS — B1920 Unspecified viral hepatitis C without hepatic coma: Secondary | ICD-10-CM | POA: Diagnosis not present

## 2020-10-28 DIAGNOSIS — E1151 Type 2 diabetes mellitus with diabetic peripheral angiopathy without gangrene: Secondary | ICD-10-CM | POA: Diagnosis not present

## 2020-10-28 DIAGNOSIS — E1140 Type 2 diabetes mellitus with diabetic neuropathy, unspecified: Secondary | ICD-10-CM | POA: Diagnosis not present

## 2020-10-28 NOTE — Progress Notes (Addendum)
GARRETH, BURNSWORTH ([id number]) Visit Report for 10/28/2020 Arrival Information Details Patient Name: Date of Service: Nicholas, Duran 10/28/2020 8:00 A M Medical Record Number: [id number] Patient Account Number: [id number] Date of Birth/Sex: Treating RN: 02-08-1956 (64 y.o. [name] Primary Care Jocie Meroney: [name] Other Clinician: Referring Leylany Nored: Treating Rashon Rezek/Extender: Randon Goldsmith in Treatment: 98 Visit Information History Since Last Visit Added or deleted any medications: No Patient Arrived: Ambulatory Any new allergies or adverse reactions: No Arrival Time: 08:15 Had a fall or experienced change in No Accompanied By: self activities of daily living that may affect Transfer Assistance: None risk of falls: Patient Identification Verified: Yes Signs or symptoms of abuse/neglect since last visito No Secondary Verification Process Completed: Yes Implantable device outside of the clinic excluding No Patient Requires Transmission-Based Precautions: No cellular tissue based products placed in the center Patient Has Alerts: Yes since last visit: Patient Alerts: Right ABI/TBI: 1.35 Has Dressing in Place as Prescribed: Yes Pain Present Now: Yes Electronic Signature(s) Signed: 10/30/2020 7:52:14 AM By: Sandre Kitty Entered By: Sandre Kitty on 10/28/2020 08:16:09 -------------------------------------------------------------------------------- Clinic Level of Care Assessment Details Patient Name: Date of Service: Nicholas, DESANTIS. 10/28/2020 8:00 A M Medical Record Number: [id number] Patient Account Number: [id number] Date of Birth/Sex: Treating RN: 09-06-1956 (64 y.o. Burnadette Pop, Lauren Primary Care Markell Schrier: [name] Other Clinician: Referring Dam Ashraf: Treating Lakesha Levinson/Extender: Randon Goldsmith in Treatment: 20 Clinic Level of Care Assessment Items TOOL 4 Quantity Score X- 1 0 Use when only an  EandM is performed on FOLLOW-UP visit ASSESSMENTS - Nursing Assessment / Reassessment X- 1 10 Reassessment of Co-morbidities (includes updates in patient status) X- 1 5 Reassessment of Adherence to Treatment Plan ASSESSMENTS - Wound and Skin A ssessment / Reassessment X - Simple Wound Assessment / Reassessment - one wound 1 5 []  - 0 Complex Wound Assessment / Reassessment - multiple wounds X- 1 10 Dermatologic / Skin Assessment (not related to wound area) ASSESSMENTS - Focused Assessment []  - 0 Circumferential Edema Measurements - multi extremities []  - 0 Nutritional Assessment / Counseling / Intervention []  - 0 Lower Extremity Assessment (monofilament, tuning fork, pulses) []  - 0 Peripheral Arterial Disease Assessment (using hand held doppler) ASSESSMENTS - Ostomy and/or Continence Assessment and Care []  - 0 Incontinence Assessment and Management []  - 0 Ostomy Care Assessment and Management (repouching, etc.) PROCESS - Coordination of Care X - Simple Patient / Family Education for ongoing care 1 15 []  - 0 Complex (extensive) Patient / Family Education for ongoing care X- 1 10 Staff obtains Programmer, systems, Records, T Results / Process Orders est []  - 0 Staff telephones HHA, Nursing Homes / Clarify orders / etc []  - 0 Routine Transfer to another Facility (non-emergent condition) []  - 0 Routine Hospital Admission (non-emergent condition) []  - 0 New Admissions / Biomedical engineer / Ordering NPWT Apligraf, etc. , []  - 0 Emergency Hospital Admission (emergent condition) X- 1 10 Simple Discharge Coordination []  - 0 Complex (extensive) Discharge Coordination PROCESS - Special Needs []  - 0 Pediatric / Minor Patient Management []  - 0 Isolation Patient Management []  - 0 Hearing / Language / Visual special needs []  - 0 Assessment of Community assistance (transportation, D/C planning, etc.) []  - 0 Additional assistance / Altered mentation []  - 0 Support Surface(s)  Assessment (bed, cushion, seat, etc.) INTERVENTIONS - Wound Cleansing / Measurement X - Simple Wound Cleansing - one wound 1 5 []  - 0 Complex Wound Cleansing - multiple wounds  X- 1 5 Wound Imaging (photographs - any number of wounds) []  - 0 Wound Tracing (instead of photographs) X- 1 5 Simple Wound Measurement - one wound []  - 0 Complex Wound Measurement - multiple wounds INTERVENTIONS - Wound Dressings X - Small Wound Dressing one or multiple wounds 1 10 []  - 0 Medium Wound Dressing one or multiple wounds []  - 0 Large Wound Dressing one or multiple wounds []  - 0 Application of Medications - topical []  - 0 Application of Medications - injection INTERVENTIONS - Miscellaneous []  - 0 External ear exam []  - 0 Specimen Collection (cultures, biopsies, blood, body fluids, etc.) []  - 0 Specimen(s) / Culture(s) sent or taken to Lab for analysis []  - 0 Patient Transfer (multiple staff / Civil Service fast streamer / Similar devices) []  - 0 Simple Staple / Suture removal (25 or less) []  - 0 Complex Staple / Suture removal (26 or more) []  - 0 Hypo / Hyperglycemic Management (close monitor of Blood Glucose) []  - 0 Ankle / Brachial Index (ABI) - do not check if billed separately X- 1 5 Vital Signs Has the patient been seen at the hospital within the last three years: Yes Total Score: 95 Level Of Care: New/Established - Level 3 Electronic Signature(s) Signed: 10/29/2020 9:59:17 AM By: Rhae Hammock RN Entered By: Rhae Hammock on 10/28/2020 08:48:58 -------------------------------------------------------------------------------- Encounter Discharge Information Details Patient Name: Date of Service: Nicholas Sheen EL L. 10/28/2020 8:00 A M Medical Record Number: [id number] Patient Account Number: [id number] Date of Birth/Sex: Treating RN: 1955/12/07 (64 y.o. Lorette Ang, Meta.Reding Primary Care Jovanni Eckhart: [name] Other Clinician: Referring Othelia Riederer: Treating Thaniel Coluccio/Extender: Randon Goldsmith in Treatment: 20 Encounter Discharge Information Items Discharge Condition: Stable Ambulatory Status: Ambulatory Discharge Destination: Home Transportation: Private Auto Accompanied By: self Schedule Follow-up Appointment: Yes Clinical Summary of Care: Electronic Signature(s) Signed: 10/28/2020 4:43:44 PM By: Deon Pilling Entered By: Deon Pilling on 10/28/2020 09:11:15 -------------------------------------------------------------------------------- Multi Wound Chart Details Patient Name: Date of Service: Nicholas Sheen EL L. 10/28/2020 8:00 A M Medical Record Number: [id number] Patient Account Number: [id number] Date of Birth/Sex: Treating RN: 03/21/1956 (64 y.o. [name] Primary Care Kippy Gohman: [name] Other Clinician: Referring Letta Cargile: Treating Kirstin Kugler/Extender: Randon Goldsmith in Treatment: 20 Vital Signs Height(in): 73 Pulse(bpm): 103 Weight(lbs): 250 Blood Pressure(mmHg): 96/56 Body Mass Index(BMI): 33 Temperature(F): 98.3 Respiratory Rate(breaths/min): 18 Photos: [3:No Photos Right, Distal Gluteus] [7:No Photos Right, Proximal Gluteus] [N/A:N/A N/A] Wound Location: [3:Gradually Appeared] [7:Gradually Appeared] [N/A:N/A] Wounding Event: [3:Pressure Ulcer] [7:Atypical] [N/A:N/A] Primary Etiology: [3:Cataracts, Sleep Apnea, Congestive] [7:N/A] [N/A:N/A] Comorbid History: [3:Heart Failure, Hypertension, Hepatitis C, Type II Diabetes, End Stage Renal Disease, Neuropathy 03/15/2020] [7:08/15/2020] [N/A:N/A] Date Acquired: [3:20] [7:9] [N/A:N/A] Weeks of Treatment: [3:Open] [7:Open] [N/A:N/A] Wound Status: [3:0.3x0.7x0.1] [7:0x0x0] [N/A:N/A] Measurements L x W x D (cm) [3:0.165] [7:0] [N/A:N/A] A (cm) : rea [3:0.016] [7:0] [N/A:N/A] Volume (cm) : [3:99.30%] [7:100.00%] [N/A:N/A] % Reduction in Area: [3:100.00%] [7:100.00%] [N/A:N/A] % Reduction in Volume: [3:Category/Stage III] [7:Full Thickness Without  Exposed] [N/A:N/A] Classification: [3:Medium] [7:Support Structures N/A] [N/A:N/A] Exudate Amount: [3:Serosanguineous] [7:N/A] [N/A:N/A] Exudate Type: [3:red, brown] [7:N/A] [N/A:N/A] Exudate Color: [3:Flat and Intact] [7:N/A] [N/A:N/A] Wound Margin: [3:Large (67-100%)] [7:N/A] [N/A:N/A] Granulation Amount: [3:Red] [7:N/A] [N/A:N/A] Granulation Quality: [3:None Present (0%)] [7:N/A] [N/A:N/A] Necrotic Amount: [3:Fat Layer (Subcutaneous Tissue): Yes N/A] [N/A:N/A] Exposed Structures: [3:Fascia: No Tendon: No Muscle: No Joint: No Bone: No Large (67-100%)] [7:N/A] [N/A:N/A] Treatment Notes Electronic Signature(s) Signed: 10/28/2020 4:37:04 PM By: Carlene Coria RN Signed: 10/28/2020 5:21:51 PM By: Dellia Nims,  Legrand Como MD Entered By: Linton Ham on 10/28/2020 08:48:39 -------------------------------------------------------------------------------- Multi-Disciplinary Care Plan Details Patient Name: Date of Service: Nicholas, Duran 10/28/2020 8:00 A M Medical Record Number: [id number] Patient Account Number: [id number] Date of Birth/Sex: Treating RN: June 04, 1956 (64 y.o. Burnadette Pop, Lauren Primary Care Raquelle Pietro: [name] Other Clinician: Referring Deshanae Lindo: Treating Linkyn Gobin/Extender: Randon Goldsmith in Treatment: 20 Active Inactive Wound/Skin Impairment Nursing Diagnoses: Impaired tissue integrity Knowledge deficit related to ulceration/compromised skin integrity Goals: Patient/caregiver will verbalize understanding of skin care regimen Date Initiated: 06/05/2020 Target Resolution Date: 12/11/2020 Goal Status: Active Interventions: Assess patient/caregiver ability to obtain necessary supplies Assess patient/caregiver ability to perform ulcer/skin care regimen upon admission and as needed Assess ulceration(s) every visit Provide education on ulcer and skin care Notes: Electronic Signature(s) Signed: 10/28/2020 8:14:37 AM By: Rhae Hammock  RN Entered By: Rhae Hammock on 10/28/2020 08:14:36 -------------------------------------------------------------------------------- Pain Assessment Details Patient Name: Date of Service: Nicholas Sheen EL L. 10/28/2020 8:00 A M Medical Record Number: [id number] Patient Account Number: [id number] Date of Birth/Sex: Treating RN: [birth year] (64 y.o. [name] Primary Care Jolene Guyett: [name] Other Clinician: Referring Avant Printy: Treating Omega Durante/Extender: Randon Goldsmith in Treatment: 20 Active Problems Location of Pain Severity and Description of Pain Patient Has Paino Yes Site Locations Rate the pain. Current Pain Level: 7 Pain Management and Medication Current Pain Management: Electronic Signature(s) Signed: 10/28/2020 4:37:04 PM By: Carlene Coria RN Signed: 10/30/2020 7:52:14 AM By: Sandre Kitty Entered By: Sandre Kitty on 10/28/2020 08:20:29 -------------------------------------------------------------------------------- Patient/Caregiver Education Details Patient Name: Date of Service: [name] 12/14/2021andnbsp8:00 A M Medical Record Number: [id number] Patient Account Number: [id number] Date of Birth/Gender: Treating RN: 1956/06/11 (64 y.o. Burnadette Pop, Lauren Primary Care Physician: [name] Other Clinician: Referring Physician: Treating Physician/Extender: Randon Goldsmith in Treatment: 20 Education Assessment Education Provided To: Patient Education Topics Provided Wound/Skin Impairment: Methods: Explain/Verbal Responses: State content correctly Electronic Signature(s) Signed: 10/29/2020 9:59:17 AM By: Rhae Hammock RN Entered By: Rhae Hammock on 10/28/2020 08:14:51 -------------------------------------------------------------------------------- Wound Assessment Details Patient Name: Date of Service: Nicholas Sheen EL L. 10/28/2020 8:00 A M Medical Record Number:  [id number] Patient Account Number: [id number] Date of Birth/Sex: Treating RN: [birth year] (64 y.o. [name] Primary Care Lelynd Poer: [name] Other Clinician: Referring Meerab Maselli: Treating Abdalrahman Clementson/Extender: Randon Goldsmith in Treatment: 20 Wound Status Wound Number: 3 Primary Pressure Ulcer Etiology: Wound Location: Right, Distal Gluteus Wound Open Wounding Event: Gradually Appeared Status: Date Acquired: 03/15/2020 Comorbid Cataracts, Sleep Apnea, Congestive Heart Failure, Hypertension, Weeks Of Treatment: 20 History: Hepatitis C, Type II Diabetes, End Stage Renal Disease, Clustered Wound: No Neuropathy Wound Measurements Length: (cm) 0.3 Width: (cm) 0.7 Depth: (cm) 0.1 Area: (cm) 0.165 Volume: (cm) 0.016 % Reduction in Area: 99.3% % Reduction in Volume: 100% Epithelialization: Large (67-100%) Tunneling: No Undermining: No Wound Description Classification: Category/Stage III Wound Margin: Flat and Intact Exudate Amount: Medium Exudate Type: Serosanguineous Exudate Color: red, brown Foul Odor After Cleansing: No Slough/Fibrino No Wound Bed Granulation Amount: Large (67-100%) Exposed Structure Granulation Quality: Red Fascia Exposed: No Necrotic Amount: None Present (0%) Fat Layer (Subcutaneous Tissue) Exposed: Yes Tendon Exposed: No Muscle Exposed: No Joint Exposed: No Bone Exposed: No Treatment Notes Wound #3 (Gluteus) Wound Laterality: Right, Distal Cleanser Soap and Water Discharge Instruction: May shower and wash wound with dial antibacterial soap and water prior to dressing change. Peri-Wound Care Topical Primary Dressing PolyMem Non-Adhesive Dressing, 4x4 in Discharge Instruction: Apply to wound bed as instructed  Secondary Dressing ComfortFoam Border, 4x4 in (silicone border) Discharge Instruction: Apply over primary dressing as directed. Secured With Compression Wrap Compression Stockings Sport and exercise psychologist) Signed: 10/28/2020 4:37:04 PM By: Carlene Coria RN Signed: 10/28/2020 4:43:44 PM By: Deon Pilling Entered By: Deon Pilling on 10/28/2020 08:31:12 -------------------------------------------------------------------------------- Wound Assessment Details Patient Name: Date of Service: Nicholas Sheen EL L. 10/28/2020 8:00 A M Medical Record Number: [id number] Patient Account Number: [id number] Date of Birth/Sex: Treating RN: [birth year] (64 y.o. [name] Primary Care Vanda Waskey: [name] Other Clinician: Referring Aliegha Paullin: Treating Altagracia Rone/Extender: Randon Goldsmith in Treatment: 20 Wound Status Wound Number: 7 Primary Etiology: Atypical Wound Location: Right, Proximal Gluteus Wound Status: Open Wounding Event: Gradually Appeared Date Acquired: 08/15/2020 Weeks Of Treatment: 9 Clustered Wound: No Wound Measurements Length: (cm) Width: (cm) Depth: (cm) Area: (cm) Volume: (cm) 0 % Reduction in Area: 100% 0 % Reduction in Volume: 100% 0 0 0 Wound Description Classification: Full Thickness Without Exposed Support Structur es Electronic Signature(s) Signed: 10/28/2020 4:37:04 PM By: Carlene Coria RN Signed: 10/30/2020 7:52:14 AM By: Sandre Kitty Entered By: Sandre Kitty on 10/28/2020 08:25:11 -------------------------------------------------------------------------------- Vitals Details Patient Name: Date of Service: Nicholas Sheen EL L. 10/28/2020 8:00 A M Medical Record Number: [id number] Patient Account Number: [id number] Date of Birth/Sex: Treating RN: 1956-05-30 (64 y.o. [name] Primary Care Kaliopi Blyden: [name] Other Clinician: Referring Lowell Mcgurk: Treating Audrina Marten/Extender: Randon Goldsmith in Treatment: 20 Vital Signs Time Taken: 08:20 Temperature (F): 98.3 Height (in): 73 Pulse (bpm): 103 Weight (lbs): 250 Respiratory Rate (breaths/min): 18 Body Mass Index (BMI): 33 Blood  Pressure (mmHg): 96/56 Reference Range: 80 - 120 mg / dl Electronic Signature(s) Signed: 10/30/2020 7:52:14 AM By: Sandre Kitty Entered By: Sandre Kitty on 10/28/2020 08:20:25

## 2020-10-29 NOTE — Progress Notes (Signed)
DREXLER, MALAND ([id number]) Visit Report for 10/28/2020 HPI Details Patient Name: Date of Service: [name], [name] 10/28/2020 8:00 A M Medical Record Number: [id number] Patient Account Number: [id number] Date of Birth/Sex: Treating RN: 03/18/1956 (64 y.o. [name] Primary Care Provider: [name] Other Clinician: Referring Provider: Treating Provider/Extender: Randon Goldsmith in Treatment: 20 History of Present Illness HPI Description: ADMISSION 06/05/2020 This is a 64 year old man who has been on dialysis secondary to diabetes for about 5 years. He was recently seen by Dr. Amalia Hailey I think for routine and prophylactic nail care and foot care. He mentioned that he had in this area on the right lateral leg and he was referred here for review of this. He has been using gentamicin cream on the wound. He also uses a cam boot for Charcot ankle. He tells me that he does not have a wound on his left foot or ankle. Almost as a side he mentioned that he had a wound on the right gluteal area. He states that this wound had been present since he was hospitalized on 2 different occasions in March. Firstly from 2/28 through 3/6 for Covid and strep bacteremia. He was rehospitalized from 3/9 through 3/12 secondary to hypotension and hypothermia. It was notable during that admission that he had a wound on the right lateral leg they did not mention the area on the right gluteal. In any case the patient is using gentamicin on this area as well his wife changes the dressing. Past medical history; end-stage renal disease on dialysis secondary to type 2 diabetes, hypertension, hyperlipidemia Charcot ankle on the left foot and ankle, he is an ex-smoker 10 years ago remote history of hepatitis C and congestive heart failure. The patient had vascular studies on 05/01/2020 this showed ABIs on the right at 2.06 but with triphasic and biphasic waveforms his TBI was actually elevated  at 1.25. He did not allow the cam boot to be removed and therefore arterial studies on the left were not done 8/10-Patient returns to clinic has continued discomfort in the right gluteal ischial area where he has a wound and less discomfort the right lateral leg wound. We have been using Santyl to these areas. He was biopsied at last visit and the results indicate inflamed granulation tissue and vasculopathy-the latter seems consistent with calciphylaxis and given the location of wound not clearly pressure etiology this may well be the case 8/17; I have reviewed the pathology report from the punch biopsies I did a few weeks ago. Calciphylaxis was not seen in these sections there was fibrinoid necrosis at the edges. They suggested clinical correlation and/or additional punch biopsies of calciphylaxis is strongly suspected. We are using Santyl to the area on the right buttock. As well as an area on the right leg which I think is a venous insufficiency ulcer, we have been using silver alginate and this is just about healed 8/31; 2-week follow-up. I spoke to his nephrologist after his visit 2 weeks ago we both agreed that thiosulfate at least on a trial basis might be indicated. Although we did not have biopsy-proven calciphylaxis this is certainly clinically what this is. However, apparently the patient states that they have not yet started the thiosulfate at dialysis. He is dialyzing at the [facility]. We have been using Santyl to the wound surface. He has 3 wounds on the right buttock. I think 2 of these have formed since the last time he was here. These were the  original satellite lesions I describe last time. the 2 new areas have a completely necrotic surface the other looks somewhat better. The induration and firmness around this area seems somewhat better today. 9/14; 2-week follow-up. He has 3 wounds on the right buttock to the original wound and 2 satellite late lesions. The satellite  lesions expanded but have a clean base the original wound is necrotic. He just started on the thiosulfate within the last 2 dialysis sessions. Apparently his nephrologist wants to speak to me about how long to run this. The other worrisome thing here is skin breakdown around the original wound. This looks somewhat angry with a small area loss of epithelialization. Almost looks like a contact dermatitis. 10/7; the patient has not been here in 3 weeks. He still has 3 areas to the right buttock the original wound is now split into 2 and a new area. This is the area I thought was calciphylaxis he apparently was receiving thiosulfate at dialysis in [place]. He arrives with an increasing pain over this area He also has 2 new areas on the left lateral upper leg and a necrotic wound on the tip of the left mid finger. He tells me that finger wound has been there for about a month although he has not mentioned it in a body. He thinks he was bitten by something 11/2; the patient continues on thiosulfate at dialysis. The areas on his right buttock are considerably better today. I am more convinced than ever that this was calciphylaxis. The last time he was here he had a new wound on the left third finger as well as purulence on the right buttock. The right buttock cultured Klebsiella Proteus and Staph aureus methicillin sensitive, the finger cultured Proteus Morganella and methicillin sensitive staph aureus all sensitive to quinolones I gave him Levaquin postdialysis. This seems to have been satisfactory. His wounds on his legs are healed. The area on the right buttock considerably better. 2 wounds just about healed. He has an area on the left third finger still a small opening but this is considerably better than last time The vascular ultrasound I did of the left upper extremity was normal with triphasic waveforms extending throughout the radial and ulnar arteries extending into the palmar arch. He does  not have a vascular issue in his upper extremity. He has never had a shunt in his left arm 11/16; everything is closed here except for the 2 areas that are left on the right buttock. These are smaller. We have been using silver alginate on these wounds 11/30 I was hoping these will be closed today however there is still 2 open areas 1 in each of the original sites. We have been using silver alginate and border foam. I believe he is still receiving thiosulfate at dialysis 12/14; anterior wound on the right buttock is closed. The posterior one is just about closed. Healthy surface. Tissue around here appears healthy. He finished his thiosulfate 2 weeks ago. Wound was secondary to calciphylaxis Electronic Signature(s) Signed: 10/28/2020 5:21:51 PM By: Linton Ham MD Entered By: Linton Ham on 10/28/2020 08:50:43 -------------------------------------------------------------------------------- Physical Exam Details Patient Name: Date of Service: Nicholas Sheen EL L. 10/28/2020 8:00 A M Medical Record Number: [id number] Patient Account Number: [id number] Date of Birth/Sex: Treating RN: 06-Aug-1956 (64 y.o. [name] Primary Care Provider: [name] Other Clinician: Referring Provider: Treating Provider/Extender: Randon Goldsmith in Treatment: 20 Constitutional Patient is hypotensive. But he appears very well. Pulse regular and within  target range for patient.Marland Kitchen Respirations regular, non-labored and within target range.. Temperature is normal and within the target range for the patient.Marland Kitchen Appears in no distress. Notes Wound exam Only the posterior wound is still open and this is very superficial. I was particularly impressed with the tissue around the wound which was a one-point firm and tender but now appears normal.o Calciphylaxis Electronic Signature(s) Signed: 10/28/2020 5:21:51 PM By: Linton Ham MD Entered By: Linton Ham on 10/28/2020  08:51:39 -------------------------------------------------------------------------------- Physician Orders Details Patient Name: Date of Service: Nicholas Sheen EL L. 10/28/2020 8:00 A M Medical Record Number: [id number] Patient Account Number: [id number] Date of Birth/Sex: Treating RN: 06-May-1956 (64 y.o. [name] Primary Care Provider: [name] Other Clinician: Referring Provider: Treating Provider/Extender: Randon Goldsmith in Treatment: 20 Verbal / Phone Orders: No Diagnosis Coding Follow-up Appointments Return Appointment in 1 week. Bathing/ Shower/ Hygiene May shower and wash wound with soap and water. - on days that dressing is changed Wound Treatment Wound #3 - Gluteus Wound Laterality: Right, Distal Cleanser: Soap and Water Every Other Day/30 Days Discharge Instructions: May shower and wash wound with dial antibacterial soap and water prior to dressing change. Prim Dressing: PolyMem Non-Adhesive Dressing, 4x4 in Every Other Day/30 Days ary Discharge Instructions: Apply to wound bed as instructed Secondary Dressing: ComfortFoam Border, 4x4 in (silicone border) Every Other Day/30 Days Discharge Instructions: Apply over primary dressing as directed. Electronic Signature(s) Signed: 10/28/2020 5:21:51 PM By: Linton Ham MD Signed: 10/29/2020 9:59:17 AM By: Rhae Hammock RN Previous Signature: 10/28/2020 8:22:53 AM Version By: Rhae Hammock RN Previous Signature: 10/28/2020 8:21:53 AM Version By: Rhae Hammock RN Previous Signature: 10/28/2020 8:21:17 AM Version By: Rhae Hammock RN Entered By: Rhae Hammock on 10/28/2020 08:47:52 -------------------------------------------------------------------------------- Problem List Details Patient Name: Date of Service: Nicholas Sheen EL L. 10/28/2020 8:00 A M Medical Record Number: [id number] Patient Account Number: [id number] Date of Birth/Sex: Treating RN: 01/05/1956 (64 y.o.  [name]) Dolores Lory, Morey Hummingbird Primary Care Provider: [name] Other Clinician: Referring Provider: Treating Provider/Extender: Randon Goldsmith in Treatment: 20 Active Problems ICD-10 Encounter Code Description Active Date MDM Diagnosis I87.331 Chronic venous hypertension (idiopathic) with ulcer and inflammation of right 06/05/2020 No Yes lower extremity L89.310 Pressure ulcer of right buttock, unstageable 06/05/2020 No Yes M70.942 Unspecified soft tissue disorder related to use, overuse and pressure, left 08/21/2020 No Yes hand L95.9 Vasculitis limited to the skin, unspecified 06/24/2020 No Yes L02.818 Cutaneous abscess of other sites 08/21/2020 No Yes E11.22 Type 2 diabetes mellitus with diabetic chronic kidney disease 06/05/2020 No Yes Inactive Problems ICD-10 Code Description Active Date Inactive Date L97.212 Non-pressure chronic ulcer of right calf with fat layer exposed 06/05/2020 06/05/2020 L97.828 Non-pressure chronic ulcer of other part of left lower leg with other specified severity 08/21/2020 08/21/2020 Resolved Problems Electronic Signature(s) Signed: 10/28/2020 5:21:51 PM By: Linton Ham MD Entered By: Linton Ham on 10/28/2020 08:48:30 -------------------------------------------------------------------------------- Progress Note Details Patient Name: Date of Service: Nicholas Sheen EL L. 10/28/2020 8:00 A M Medical Record Number: [id number] Patient Account Number: [id number] Date of Birth/Sex: Treating RN: 10/03/1956 (64 y.o. [name] Primary Care Provider: [name] Other Clinician: Referring Provider: Treating Provider/Extender: Randon Goldsmith in Treatment: 20 Subjective History of Present Illness (HPI) ADMISSION 06/05/2020 This is a 64 year old man who has been on dialysis secondary to diabetes for about 5 years. He was recently seen by Dr. Amalia Hailey I think for routine and prophylactic nail care and foot care. He mentioned  that he  had in this area on the right lateral leg and he was referred here for review of this. He has been using gentamicin cream on the wound. He also uses a cam boot for Charcot ankle. He tells me that he does not have a wound on his left foot or ankle. Almost as a side he mentioned that he had a wound on the right gluteal area. He states that this wound had been present since he was hospitalized on 2 different occasions in March. Firstly from 2/28 through 3/6 for Covid and strep bacteremia. He was rehospitalized from 3/9 through 3/12 secondary to hypotension and hypothermia. It was notable during that admission that he had a wound on the right lateral leg they did not mention the area on the right gluteal. In any case the patient is using gentamicin on this area as well his wife changes the dressing. Past medical history; end-stage renal disease on dialysis secondary to type 2 diabetes, hypertension, hyperlipidemia Charcot ankle on the left foot and ankle, he is an ex-smoker 10 years ago remote history of hepatitis C and congestive heart failure. The patient had vascular studies on 05/01/2020 this showed ABIs on the right at 2.06 but with triphasic and biphasic waveforms his TBI was actually elevated at 1.25. He did not allow the cam boot to be removed and therefore arterial studies on the left were not done 8/10-Patient returns to clinic has continued discomfort in the right gluteal ischial area where he has a wound and less discomfort the right lateral leg wound. We have been using Santyl to these areas. He was biopsied at last visit and the results indicate inflamed granulation tissue and vasculopathy-the latter seems consistent with calciphylaxis and given the location of wound not clearly pressure etiology this may well be the case 8/17; I have reviewed the pathology report from the punch biopsies I did a few weeks ago. Calciphylaxis was not seen in these sections there was fibrinoid necrosis at  the edges. They suggested clinical correlation and/or additional punch biopsies of calciphylaxis is strongly suspected. We are using Santyl to the area on the right buttock. As well as an area on the right leg which I think is a venous insufficiency ulcer, we have been using silver alginate and this is just about healed 8/31; 2-week follow-up. I spoke to his nephrologist after his visit 2 weeks ago we both agreed that thiosulfate at least on a trial basis might be indicated. Although we did not have biopsy-proven calciphylaxis this is certainly clinically what this is. However, apparently the patient states that they have not yet started the thiosulfate at dialysis. He is dialyzing at the [facility]. We have been using Santyl to the wound surface. He has 3 wounds on the right buttock. I think 2 of these have formed since the last time he was here. These were the original satellite lesions I describe last time. the 2 new areas have a completely necrotic surface the other looks somewhat better. The induration and firmness around this area seems somewhat better today. 9/14; 2-week follow-up. He has 3 wounds on the right buttock to the original wound and 2 satellite late lesions. The satellite lesions expanded but have a clean base the original wound is necrotic. He just started on the thiosulfate within the last 2 dialysis sessions. Apparently his nephrologist wants to speak to me about how long to run this. The other worrisome thing here is skin breakdown around the original wound. This looks somewhat angry  with a small area loss of epithelialization. Almost looks like a contact dermatitis. 10/7; the patient has not been here in 3 weeks. He still has 3 areas to the right buttock the original wound is now split into 2 and a new area. This is the area I thought was calciphylaxis he apparently was receiving thiosulfate at dialysis in [place]. He arrives with an increasing pain over this area He  also has 2 new areas on the left lateral upper leg and a necrotic wound on the tip of the left mid finger. He tells me that finger wound has been there for about a month although he has not mentioned it in a body. He thinks he was bitten by something 11/2; the patient continues on thiosulfate at dialysis. The areas on his right buttock are considerably better today. I am more convinced than ever that this was calciphylaxis. The last time he was here he had a new wound on the left third finger as well as purulence on the right buttock. The right buttock cultured Klebsiella Proteus and Staph aureus methicillin sensitive, the finger cultured Proteus Morganella and methicillin sensitive staph aureus all sensitive to quinolones I gave him Levaquin postdialysis. This seems to have been satisfactory. His wounds on his legs are healed. The area on the right buttock considerably better. 2 wounds just about healed. He has an area on the left third finger still a small opening but this is considerably better than last time The vascular ultrasound I did of the left upper extremity was normal with triphasic waveforms extending throughout the radial and ulnar arteries extending into the palmar arch. He does not have a vascular issue in his upper extremity. He has never had a shunt in his left arm 11/16; everything is closed here except for the 2 areas that are left on the right buttock. These are smaller. We have been using silver alginate on these wounds 11/30 I was hoping these will be closed today however there is still 2 open areas 1 in each of the original sites. We have been using silver alginate and border foam. I believe he is still receiving thiosulfate at dialysis 12/14; anterior wound on the right buttock is closed. The posterior one is just about closed. Healthy surface. Tissue around here appears healthy. He finished his thiosulfate 2 weeks ago. Wound was secondary to  calciphylaxis Objective Constitutional Patient is hypotensive. But he appears very well. Pulse regular and within target range for patient.Marland Kitchen Respirations regular, non-labored and within target range.. Temperature is normal and within the target range for the patient.Marland Kitchen Appears in no distress. Vitals Time Taken: 8:20 AM, Height: 73 in, Weight: 250 lbs, BMI: 33, Temperature: 98.3 F, Pulse: 103 bpm, Respiratory Rate: 18 breaths/min, Blood Pressure: 96/56 mmHg. General Notes: Wound exam ooOnly the posterior wound is still open and this is very superficial. I was particularly impressed with the tissue around the wound which was a one-point firm and tender but now appears normal.o Calciphylaxis Integumentary (Hair, Skin) Wound #3 status is Open. Original cause of wound was Gradually Appeared. The wound is located on the Right,Distal Gluteus. The wound measures 0.3cm length x 0.7cm width x 0.1cm depth; 0.165cm^2 area and 0.016cm^3 volume. There is Fat Layer (Subcutaneous Tissue) exposed. There is no tunneling or undermining noted. There is a medium amount of serosanguineous drainage noted. The wound margin is flat and intact. There is large (67-100%) red granulation within the wound bed. There is no necrotic tissue within the wound bed.  Wound #7 status is Open. Original cause of wound was Gradually Appeared. The wound is located on the Right,Proximal Gluteus. The wound measures 0cm length x 0cm width x 0cm depth; 0cm^2 area and 0cm^3 volume. Assessment Active Problems ICD-10 Chronic venous hypertension (idiopathic) with ulcer and inflammation of right lower extremity Pressure ulcer of right buttock, unstageable Unspecified soft tissue disorder related to use, overuse and pressure, left hand Vasculitis limited to the skin, unspecified Cutaneous abscess of other sites Type 2 diabetes mellitus with diabetic chronic kidney disease Plan Follow-up Appointments: Return Appointment in 1 week. Bathing/  Shower/ Hygiene: May shower and wash wound with soap and water. - on days that dressing is changed WOUND #3: - Gluteus Wound Laterality: Right, Distal Cleanser: Soap and Water Every Other Day/30 Days Discharge Instructions: May shower and wash wound with dial antibacterial soap and water prior to dressing change. Prim Dressing: PolyMem Non-Adhesive Dressing, 4x4 in Every Other Day/30 Days ary Discharge Instructions: Apply to wound bed as instructed Secondary Dressing: ComfortFoam Border, 4x4 in (silicone border) Every Other Day/30 Days Discharge Instructions: Apply over primary dressing as directed. 1. I change the primary dressing to polymen from silver alginate hopefully getting this area to close in by next week 2. I will bring him back before the holiday to see if this is closed. 3. Patient tells me his thiosulfate was stopped last visit at dialysis this is appropriate. He dialyzes at the [place] [facility]. I suspect this patient had calciphylaxis based on the appearance of the wound initially. Electronic Signature(s) Signed: 10/28/2020 5:21:51 PM By: Linton Ham MD Entered By: Linton Ham on 10/28/2020 08:52:45 -------------------------------------------------------------------------------- SuperBill Details Patient Name: Date of Service: Ladona Mow 10/28/2020 Medical Record Number: [id number] Patient Account Number: [id number] Date of Birth/Sex: Treating RN: November 25, 1955 (64 y.o. Burnadette Pop, Lauren Primary Care Provider: [name] Other Clinician: Referring Provider: Treating Provider/Extender: Randon Goldsmith in Treatment: 20 Diagnosis Coding ICD-10 Codes Code Description [phone] Chronic venous hypertension (idiopathic) with ulcer and inflammation of right lower extremity L89.310 Pressure ulcer of right buttock, unstageable M70.942 Unspecified soft tissue disorder related to use, overuse and pressure, left hand L95.9 Vasculitis limited to the skin,  unspecified L02.818 Cutaneous abscess of other sites E11.22 Type 2 diabetes mellitus with diabetic chronic kidney disease Facility Procedures CPT4 Code: [id number] Description: 99213 - WOUND CARE VISIT-LEV 3 EST PT Modifier: Quantity: 1 Physician Procedures : CPT4 Code Description Modifier [id number] - WC PHYS LEVEL 3 - EST PT ICD-10 Diagnosis Description L89.310 Pressure ulcer of right buttock, unstageable L95.9 Vasculitis limited to the skin, unspecified Quantity: 1 Electronic Signature(s) Signed: 10/28/2020 5:21:51 PM By: Linton Ham MD Entered By: Linton Ham on 10/28/2020 08:53:14

## 2020-11-04 ENCOUNTER — Encounter (HOSPITAL_BASED_OUTPATIENT_CLINIC_OR_DEPARTMENT_OTHER): Payer: Medicare (Managed Care) | Admitting: Internal Medicine

## 2020-11-04 ENCOUNTER — Other Ambulatory Visit: Payer: Self-pay

## 2020-11-18 ENCOUNTER — Encounter (HOSPITAL_BASED_OUTPATIENT_CLINIC_OR_DEPARTMENT_OTHER): Payer: Medicare Other | Attending: Internal Medicine | Admitting: Internal Medicine

## 2020-11-18 ENCOUNTER — Other Ambulatory Visit: Payer: Self-pay

## 2020-11-18 DIAGNOSIS — Z992 Dependence on renal dialysis: Secondary | ICD-10-CM | POA: Diagnosis not present

## 2020-11-18 DIAGNOSIS — E1140 Type 2 diabetes mellitus with diabetic neuropathy, unspecified: Secondary | ICD-10-CM | POA: Insufficient documentation

## 2020-11-18 DIAGNOSIS — L89310 Pressure ulcer of right buttock, unstageable: Secondary | ICD-10-CM | POA: Insufficient documentation

## 2020-11-18 DIAGNOSIS — Z87891 Personal history of nicotine dependence: Secondary | ICD-10-CM | POA: Insufficient documentation

## 2020-11-18 DIAGNOSIS — I509 Heart failure, unspecified: Secondary | ICD-10-CM | POA: Insufficient documentation

## 2020-11-18 DIAGNOSIS — I87311 Chronic venous hypertension (idiopathic) with ulcer of right lower extremity: Secondary | ICD-10-CM | POA: Diagnosis not present

## 2020-11-18 DIAGNOSIS — I132 Hypertensive heart and chronic kidney disease with heart failure and with stage 5 chronic kidney disease, or end stage renal disease: Secondary | ICD-10-CM | POA: Diagnosis not present

## 2020-11-18 DIAGNOSIS — E11622 Type 2 diabetes mellitus with other skin ulcer: Secondary | ICD-10-CM | POA: Diagnosis not present

## 2020-11-18 DIAGNOSIS — Z8619 Personal history of other infectious and parasitic diseases: Secondary | ICD-10-CM | POA: Diagnosis not present

## 2020-11-18 DIAGNOSIS — I87331 Chronic venous hypertension (idiopathic) with ulcer and inflammation of right lower extremity: Secondary | ICD-10-CM | POA: Diagnosis not present

## 2020-11-18 DIAGNOSIS — E1122 Type 2 diabetes mellitus with diabetic chronic kidney disease: Secondary | ICD-10-CM | POA: Insufficient documentation

## 2020-11-18 DIAGNOSIS — B1920 Unspecified viral hepatitis C without hepatic coma: Secondary | ICD-10-CM | POA: Insufficient documentation

## 2020-11-18 DIAGNOSIS — N186 End stage renal disease: Secondary | ICD-10-CM | POA: Insufficient documentation

## 2020-11-18 DIAGNOSIS — M70942 Unspecified soft tissue disorder related to use, overuse and pressure, left hand: Secondary | ICD-10-CM | POA: Insufficient documentation

## 2020-11-18 DIAGNOSIS — L959 Vasculitis limited to the skin, unspecified: Secondary | ICD-10-CM | POA: Diagnosis not present

## 2020-11-18 NOTE — Progress Notes (Signed)
ATHEN, RIEL ([id number]) Visit Report for 11/18/2020 HPI Details Patient Name: Date of Service: [name], [name] 11/18/2020 8:00 A M Medical Record Number: [id number] Patient Account Number: [id number] Date of Birth/Sex: Treating RN: [birth year] (65 y.o. [name] Primary Care Provider: Antony Duran Other Clinician: Referring Provider: Treating Provider/Extender: [name] in Treatment: 23 History of Present Illness HPI Description: ADMISSION 06/05/2020 This is a 65 year old man who has been on dialysis secondary to diabetes for about 5 years. He was recently seen by [name] I think for routine and prophylactic nail care and foot care. He mentioned that he had in this area on the right lateral leg and he was referred here for review of this. He has been using gentamicin cream on the wound. He also uses a cam boot for Charcot ankle. He tells me that he does not have a wound on his left foot or ankle. Almost as a side he mentioned that he had a wound on the right gluteal area. He states that this wound had been present since he was hospitalized on 2 different occasions in March. Firstly from 2/28 through 3/6 for Covid and strep bacteremia. He was rehospitalized from 3/9 through 3/12 secondary to hypotension and hypothermia. It was notable during that admission that he had a wound on the right lateral leg they did not mention the area on the right gluteal. In any case the patient is using gentamicin on this area as well his wife changes the dressing. Past medical history; end-stage renal disease on dialysis secondary to type 2 diabetes, hypertension, hyperlipidemia Charcot ankle on the left foot and ankle, he is an ex-smoker 10 years ago remote history of hepatitis C and congestive heart failure. The patient had vascular studies on 05/01/2020 this showed ABIs on the right at 2.06 but with triphasic and biphasic waveforms his TBI was actually elevated  at 1.25. He did not allow the cam boot to be removed and therefore arterial studies on the left were not done 8/10-Patient returns to clinic has continued discomfort in the right gluteal ischial area where he has a wound and less discomfort the right lateral leg wound. We have been using Santyl to these areas. He was biopsied at last visit and the results indicate inflamed granulation tissue and vasculopathy-the latter seems consistent with calciphylaxis and given the location of wound not clearly pressure etiology this may well be the case 8/17; I have reviewed the pathology report from the punch biopsies I did a few weeks ago. Calciphylaxis was not seen in these sections there was fibrinoid necrosis at the edges. They suggested clinical correlation and/or additional punch biopsies of calciphylaxis is strongly suspected. We are using Santyl to the area on the right buttock. As well as an area on the right leg which I think is a venous insufficiency ulcer, we have been using silver alginate and this is just about healed 8/31; 2-week follow-up. I spoke to his nephrologist after his visit 2 weeks ago we both agreed that thiosulfate at least on a trial basis might be indicated. Although we did not have biopsy-proven calciphylaxis this is certainly clinically what this is. However, apparently the patient states that they have not yet started the thiosulfate at dialysis. He is dialyzing at the [facility]. We have been using Santyl to the wound surface. He has 3 wounds on the right buttock. I think 2 of these have formed since the last time he was here. These were the  original satellite lesions I describe last time. the 2 new areas have a completely necrotic surface the other looks somewhat better. The induration and firmness around this area seems somewhat better today. 9/14; 2-week follow-up. He has 3 wounds on the right buttock to the original wound and 2 satellite late lesions. The satellite  lesions expanded but have a clean base the original wound is necrotic. He just started on the thiosulfate within the last 2 dialysis sessions. Apparently his nephrologist wants to speak to me about how long to run this. The other worrisome thing here is skin breakdown around the original wound. This looks somewhat angry with a small area loss of epithelialization. Almost looks like a contact dermatitis. 10/7; the patient has not been here in 3 weeks. He still has 3 areas to the right buttock the original wound is now split into 2 and a new area. This is the area I thought was calciphylaxis he apparently was receiving thiosulfate at dialysis in [place]. He arrives with an increasing pain over this area He also has 2 new areas on the left lateral upper leg and a necrotic wound on the tip of the left mid finger. He tells me that finger wound has been there for about a month although he has not mentioned it in a body. He thinks he was bitten by something 11/2; the patient continues on thiosulfate at dialysis. The areas on his right buttock are considerably better today. I am more convinced than ever that this was calciphylaxis. The last time he was here he had a new wound on the left third finger as well as purulence on the right buttock. The right buttock cultured Klebsiella Proteus and Staph aureus methicillin sensitive, the finger cultured Proteus Morganella and methicillin sensitive staph aureus all sensitive to quinolones I gave him Levaquin postdialysis. This seems to have been satisfactory. His wounds on his legs are healed. The area on the right buttock considerably better. 2 wounds just about healed. He has an area on the left third finger still a small opening but this is considerably better than last time The vascular ultrasound I did of the left upper extremity was normal with triphasic waveforms extending throughout the radial and ulnar arteries extending into the palmar arch. He does  not have a vascular issue in his upper extremity. He has never had a shunt in his left arm 11/16; everything is closed here except for the 2 areas that are left on the right buttock. These are smaller. We have been using silver alginate on these wounds 11/30 I was hoping these will be closed today however there is still 2 open areas 1 in each of the original sites. We have been using silver alginate and border foam. I believe he is still receiving thiosulfate at dialysis 12/14; anterior wound on the right buttock is closed. The posterior one is just about closed. Healthy surface. Tissue around here appears healthy. He finished his thiosulfate 2 weeks ago. Wound was secondary to calciphylaxis 11/18/2020; his buttock wounds are all closed. Healthy surface granulation. Wound secondary to calciphylaxis. Electronic Signature(s) Signed: 11/18/2020 4:50:25 PM By: Linton Ham MD Entered By: Linton Ham on 11/18/2020 08:24:44 -------------------------------------------------------------------------------- Physical Exam Details Patient Name: Date of Service: [name] 11/18/2020 8:00 A M Medical Record Number: [id number] Patient Account Number: [id number] Date of Birth/Sex: Treating RN: [birth year] (65 y.o. Burnadette Pop, Lauren Primary Care Provider: Antony Duran Other Clinician: Referring Provider: Treating Provider/Extender: [name] in Treatment:  23 Constitutional Sitting or standing Blood Pressure is within target range for patient.. Pulse regular and within target range for patient.Marland Kitchen Respirations regular, non-labored and within target range.. Temperature is normal and within the target range for the patient.Marland Kitchen Appears in no distress. Notes Wound exam; all of his wounds on the buttock are closed. No palpable tenderness no crepitus surface epithelialization looks satisfactory. There is no erythema around the wound Electronic Signature(s) Signed: 11/18/2020  4:50:25 PM By: Linton Ham MD Entered By: Linton Ham on 11/18/2020 08:25:52 -------------------------------------------------------------------------------- Physician Orders Details Patient Name: Date of Service: [name] 11/18/2020 8:00 A M Medical Record Number: [id number] Patient Account Number: [id number] Date of Birth/Sex: Treating RN: 02-Feb-1956 (65 y.o. Burnadette Pop, Lauren Primary Care Provider: Antony Duran Other Clinician: Referring Provider: Treating Provider/Extender: [name] in Treatment: 23 Verbal / Phone Orders: No Diagnosis Coding Discharge From [facility] Services Discharge from [place] [facility] Turn and reposition every 2 hours Electronic Signature(s) Signed: 11/18/2020 4:50:25 PM By: Linton Ham MD Signed: 11/18/2020 5:38:12 PM By: Rhae Hammock RN Entered By: Rhae Hammock on 11/18/2020 08:08:56 -------------------------------------------------------------------------------- Problem List Details Patient Name: Date of Service: [name] 11/18/2020 8:00 A M Medical Record Number: [id number] Patient Account Number: [id number] Date of Birth/Sex: Treating RN: 1955-12-19 (65 y.o. Burnadette Pop, Lauren Primary Care Provider: Other Clinician: Antony Duran Referring Provider: Treating Provider/Extender: [name] in Treatment: 23 Active Problems ICD-10 Encounter Code Description Active Date MDM Diagnosis I87.331 Chronic venous hypertension (idiopathic) with ulcer and inflammation of right 06/05/2020 No Yes lower extremity L89.310 Pressure ulcer of right buttock, unstageable 06/05/2020 No Yes M70.942 Unspecified soft tissue disorder related to use, overuse and pressure, left 08/21/2020 No Yes hand L95.9 Vasculitis limited to the skin, unspecified 06/24/2020 No Yes L02.818 Cutaneous abscess of other sites 08/21/2020 No Yes E11.22 Type 2 diabetes mellitus with diabetic chronic  kidney disease 06/05/2020 No Yes Inactive Problems ICD-10 Code Description Active Date Inactive Date L97.212 Non-pressure chronic 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 11/18/2020 8:00 A M Medical Record Number: [id number] Patient Account Number: [id number] Date of Birth/Sex: Treating RN: Dec 05, 1955 (65 y.o. [name] Primary Care Provider: Antony Duran Other Clinician: Referring Provider: Treating Provider/Extender: [name] in Treatment: 23 Subjective History of Present Illness (HPI) ADMISSION 06/05/2020 This is a 65 year old man who has been on dialysis secondary to diabetes for about 5 years. He was recently seen by [name] I think for routine and prophylactic nail care and foot care. He mentioned that he had in this area on the right lateral leg and he was referred here for review of this. He has been using gentamicin cream on the wound. He also uses a cam boot for Charcot ankle. He tells me that he does not have a wound on his left foot or ankle. Almost as a side he mentioned that he had a wound on the right gluteal area. He states that this wound had been present since he was hospitalized on 2 different occasions in March. Firstly from 2/28 through 3/6 for Covid and strep bacteremia. He was rehospitalized from 3/9 through 3/12 secondary to hypotension and hypothermia.  It was notable during that admission that he had a wound on the right lateral leg they did not mention the area on the right gluteal. In any case  the patient is using gentamicin on this area as well his wife changes the dressing. Past medical history; end-stage renal disease on dialysis secondary to type 2 diabetes, hypertension, hyperlipidemia Charcot ankle on the left foot and ankle, he is an ex-smoker 10 years ago remote history of hepatitis C and congestive heart failure. The patient had vascular studies on 05/01/2020 this showed ABIs on the right at 2.06 but with triphasic and biphasic waveforms his TBI was actually elevated at 1.25. He did not allow the cam boot to be removed and therefore arterial studies on the left were not done 8/10-Patient returns to clinic has continued discomfort in the right gluteal ischial area where he has a wound and less discomfort the right lateral leg wound. We have been using Santyl to these areas. He was biopsied at last visit and the results indicate inflamed granulation tissue and vasculopathy-the latter seems consistent with calciphylaxis and given the location of wound not clearly pressure etiology this may well be the case 8/17; I have reviewed the pathology report from the punch biopsies I did a few weeks ago. Calciphylaxis was not seen in these sections there was fibrinoid necrosis at the edges. They suggested clinical correlation and/or additional punch biopsies of calciphylaxis is strongly suspected. We are using Santyl to the area on the right buttock. As well as an area on the right leg which I think is a venous insufficiency ulcer, we have been using silver alginate and this is just about healed 8/31; 2-week follow-up. I spoke to his nephrologist after his visit 2 weeks ago we both agreed that thiosulfate at least on a trial basis might be indicated. Although we did not have biopsy-proven calciphylaxis this is certainly clinically what this is. However, apparently the patient states that they have not yet started the thiosulfate at dialysis. He is dialyzing at the [facility]. We have been  using Santyl to the wound surface. He has 3 wounds on the right buttock. I think 2 of these have formed since the last time he was here. These were the original satellite lesions I describe last time. the 2 new areas have a completely necrotic surface the other looks somewhat better. The induration and firmness around this area seems somewhat better today. 9/14; 2-week follow-up. He has 3 wounds on the right buttock to the original wound and 2 satellite late lesions. The satellite lesions expanded but have a clean base the original wound is necrotic. He just started on the thiosulfate within the last 2 dialysis sessions. Apparently his nephrologist wants to speak to me about how long to run this. The other worrisome thing here is skin breakdown around the original wound. This looks somewhat angry with a small area loss of epithelialization. Almost looks like a contact dermatitis. 10/7; the patient has not been here in 3 weeks. He still has 3 areas to the right buttock the original wound is now split into 2 and a new area. This is the area I thought was calciphylaxis he apparently was receiving thiosulfate at dialysis in [place]. He arrives with an increasing pain over this area He also has 2 new areas on the left lateral upper leg and a necrotic wound on the tip of the left mid finger. He tells me that finger wound has been there for about a month  although he has not mentioned it in a body. He thinks he was bitten by something 11/2; the patient continues on thiosulfate at dialysis. The areas on his right buttock are considerably better today. I am more convinced than ever that this was calciphylaxis. The last time he was here he had a new wound on the left third finger as well as purulence on the right buttock. The right buttock cultured Klebsiella Proteus and Staph aureus methicillin sensitive, the finger cultured Proteus Morganella and methicillin sensitive staph aureus all sensitive  to quinolones I gave him Levaquin postdialysis. This seems to have been satisfactory. His wounds on his legs are healed. The area on the right buttock considerably better. 2 wounds just about healed. He has an area on the left third finger still a small opening but this is considerably better than last time The vascular ultrasound I did of the left upper extremity was normal with triphasic waveforms extending throughout the radial and ulnar arteries extending into the palmar arch. He does not have a vascular issue in his upper extremity. He has never had a shunt in his left arm 11/16; everything is closed here except for the 2 areas that are left on the right buttock. These are smaller. We have been using silver alginate on these wounds 11/30 I was hoping these will be closed today however there is still 2 open areas 1 in each of the original sites. We have been using silver alginate and border foam. I believe he is still receiving thiosulfate at dialysis 12/14; anterior wound on the right buttock is closed. The posterior one is just about closed. Healthy surface. Tissue around here appears healthy. He finished his thiosulfate 2 weeks ago. Wound was secondary to calciphylaxis 11/18/2020; his buttock wounds are all closed. Healthy surface granulation. Wound secondary to calciphylaxis. Objective Constitutional Sitting or standing Blood Pressure is within target range for patient.. Pulse regular and within target range for patient.Marland Kitchen Respirations regular, non-labored and within target range.. Temperature is normal and within the target range for the patient.Marland Kitchen Appears in no distress. Vitals Time Taken: 7:56 AM, Height: 73 in, Source: Stated, Weight: 250 lbs, Source: Stated, BMI: 33, Temperature: 97.6 F, Pulse: 86 bpm, Respiratory Rate: 18 breaths/min, Blood Pressure: 120/83 mmHg. General Notes: Wound exam; all of his wounds on the buttock are closed. No palpable tenderness no crepitus surface  epithelialization looks satisfactory. There is no erythema around the wound Integumentary (Hair, Skin) Wound #3 status is Healed - Epithelialized. Original cause of wound was Gradually Appeared. The wound is located on the Right,Distal Gluteus. The wound measures 0cm length x 0cm width x 0cm depth; 0cm^2 area and 0cm^3 volume. There is no tunneling or undermining noted. There is a none present amount of drainage noted. The wound margin is flat and intact. There is no granulation within the wound bed. There is no necrotic tissue within the wound bed. Assessment Active Problems ICD-10 Chronic venous hypertension (idiopathic) with ulcer and inflammation of right lower extremity Pressure ulcer of right buttock, unstageable Unspecified soft tissue disorder related to use, overuse and pressure, left hand Vasculitis limited to the skin, unspecified Cutaneous abscess of other sites Type 2 diabetes mellitus with diabetic chronic kidney disease Plan Discharge From [facility] Services: Discharge from [place] [facility]: Turn and reposition every 2 hours #1 the patient's wounds are healed 2. I spent some time talking to him about calciphylaxis and the possibility of recurrence. What a wound secondary to calciphylaxis generally feels like an  looks like. I warned him that if he develops something that is compatible that is rapidly expanding and painful he should show this to his nephrologist because I think starting on thiosulfate at an earlier stage might be helpful. This would be in lieu of waiting a month to come in here, 3 months to get into a dermatologist etc. Electronic Signature(s) Signed: 11/18/2020 4:50:25 PM By: Linton Ham MD Entered By: Linton Ham on 11/18/2020 08:27:05 -------------------------------------------------------------------------------- SuperBill Details Patient Name: Date of Service: Ladona Mow. 11/18/2020 Medical Record Number: [id number] Patient Account  Number: [id number] Date of Birth/Sex: Treating RN: May 10, 1956 (65 y.o. Burnadette Pop, Lauren Primary Care Provider: Antony Duran Other Clinician: Referring Provider: Treating Provider/Extender: [name] in Treatment: 23 Diagnosis Coding ICD-10 Codes Code Description [phone] Chronic venous hypertension (idiopathic) with ulcer and inflammation of right lower extremity L89.310 Pressure ulcer of right buttock, unstageable M70.942 Unspecified soft tissue disorder related to use, overuse and pressure, left hand L95.9 Vasculitis limited to the skin, unspecified L02.818 Cutaneous abscess of other sites E11.22 Type 2 diabetes mellitus with diabetic chronic kidney disease Facility Procedures CPT4 Code: [id number] Description: 26415 - WOUND CARE VISIT-LEV 3 EST PT Modifier: Quantity: 1 Physician Procedures Electronic Signature(s) Signed: 11/18/2020 4:50:25 PM By: Linton Ham MD Entered By: Linton Ham on 11/18/2020 08:27:30

## 2020-11-18 NOTE — Progress Notes (Signed)
TAHA, DIMOND ([id number]) Visit Report for 11/18/2020 Arrival Information Details Patient Name: Date of Service: [name] 11/18/2020 8:00 A M Medical Record Number: [id number] Patient Account Number: [id number] Date of Birth/Sex: Treating RN: Jun 22, 1956 (65 y.o. Ernestene Mention Primary Care Leverne Tessler: [name] Other Clinician: Referring Nolie Bignell: Treating Obaloluwa Delatte/Extender: Randon Goldsmith in Treatment: 23 Visit Information History Since Last Visit Added or deleted any medications: No Patient Arrived: Ambulatory Any new allergies or adverse reactions: No Arrival Time: 07:53 Had a fall or experienced change in No Accompanied By: self activities of daily living that may affect Transfer Assistance: None risk of falls: Patient Identification Verified: Yes Signs or symptoms of abuse/neglect since last visito No Secondary Verification Process Completed: Yes Hospitalized since last visit: No Patient Requires Transmission-Based Precautions: No Implantable device outside of the clinic excluding No Patient Has Alerts: Yes cellular tissue based products placed in the center Patient Alerts: Right ABI/TBI: 1.35 since last visit: Has Dressing in Place as Prescribed: Yes Pain Present Now: Yes Electronic Signature(s) Signed: 11/18/2020 5:39:46 PM By: Baruch Gouty RN, BSN Entered By: Baruch Gouty on 11/18/2020 07:54:08 -------------------------------------------------------------------------------- Clinic Level of Care Assessment Details Patient Name: Date of Service: CARZELL, Nicholas EL L. 11/18/2020 8:00 A M Medical Record Number: [id number] Patient Account Number: [id number] Date of Birth/Sex: Treating RN: 1956-02-26 (65 y.o. Burnadette Pop, Lauren Primary Care Diannie Willner: [name] Other Clinician: Referring Braley Luckenbaugh: Treating Selene Peltzer/Extender: Randon Goldsmith in Treatment: 23 Clinic Level of Care Assessment Items TOOL 4 Quantity  Score X- 1 0 Use when only an EandM is performed on FOLLOW-UP visit ASSESSMENTS - Nursing Assessment / Reassessment X- 1 10 Reassessment of Co-morbidities (includes updates in patient status) X- 1 5 Reassessment of Adherence to Treatment Plan ASSESSMENTS - Wound and Skin A ssessment / Reassessment X - Simple Wound Assessment / Reassessment - one wound 1 5 []  - 0 Complex Wound Assessment / Reassessment - multiple wounds X- 1 10 Dermatologic / Skin Assessment (not related to wound area) ASSESSMENTS - Focused Assessment []  - 0 Circumferential Edema Measurements - multi extremities X- 1 10 Nutritional Assessment / Counseling / Intervention []  - 0 Lower Extremity Assessment (monofilament, tuning fork, pulses) []  - 0 Peripheral Arterial Disease Assessment (using hand held doppler) ASSESSMENTS - Ostomy and/or Continence Assessment and Care []  - 0 Incontinence Assessment and Management []  - 0 Ostomy Care Assessment and Management (repouching, etc.) PROCESS - Coordination of Care X - Simple Patient / Family Education for ongoing care 1 15 []  - 0 Complex (extensive) Patient / Family Education for ongoing care X- 1 10 Staff obtains Programmer, systems, Records, T Results / Process Orders est []  - 0 Staff telephones HHA, Nursing Homes / Clarify orders / etc []  - 0 Routine Transfer to another Facility (non-emergent condition) []  - 0 Routine Hospital Admission (non-emergent condition) []  - 0 New Admissions / Biomedical engineer / Ordering NPWT Apligraf, etc. , []  - 0 Emergency Hospital Admission (emergent condition) X- 1 10 Simple Discharge Coordination []  - 0 Complex (extensive) Discharge Coordination PROCESS - Special Needs []  - 0 Pediatric / Minor Patient Management []  - 0 Isolation Patient Management []  - 0 Hearing / Language / Visual special needs []  - 0 Assessment of Community assistance (transportation, D/C planning, etc.) []  - 0 Additional assistance / Altered  mentation []  - 0 Support Surface(s) Assessment (bed, cushion, seat, etc.) INTERVENTIONS - Wound Cleansing / Measurement X - Simple Wound Cleansing - one wound 1 5 []  -  0 Complex Wound Cleansing - multiple wounds X- 1 5 Wound Imaging (photographs - any number of wounds) []  - 0 Wound Tracing (instead of photographs) X- 1 5 Simple Wound Measurement - one wound []  - 0 Complex Wound Measurement - multiple wounds INTERVENTIONS - Wound Dressings X - Small Wound Dressing one or multiple wounds 1 10 []  - 0 Medium Wound Dressing one or multiple wounds []  - 0 Large Wound Dressing one or multiple wounds []  - 0 Application of Medications - topical []  - 0 Application of Medications - injection INTERVENTIONS - Miscellaneous []  - 0 External ear exam []  - 0 Specimen Collection (cultures, biopsies, blood, body fluids, etc.) []  - 0 Specimen(s) / Culture(s) sent or taken to Lab for analysis []  - 0 Patient Transfer (multiple staff / Civil Service fast streamer / Similar devices) []  - 0 Simple Staple / Suture removal (25 or less) []  - 0 Complex Staple / Suture removal (26 or more) []  - 0 Hypo / Hyperglycemic Management (close monitor of Blood Glucose) []  - 0 Ankle / Brachial Index (ABI) - do not check if billed separately X- 1 5 Vital Signs Has the patient been seen at the hospital within the last three years: Yes Total Score: 105 Level Of Care: New/Established - Level 3 Electronic Signature(s) Signed: 11/18/2020 5:38:12 PM By: Rhae Hammock RN Entered By: Rhae Hammock on 11/18/2020 08:10:03 -------------------------------------------------------------------------------- Lower Extremity Assessment Details Patient Name: Date of Service: [name] EL L. 11/18/2020 8:00 A M Medical Record Number: [id number] Patient Account Number: [id number] Date of Birth/Sex: Treating RN: 09/30/1956 (65 y.o. Ernestene Mention Primary Care Reginia Battie: [name] Other Clinician: Referring Hershal Eriksson: Treating  Vikkie Goeden/Extender: Randon Goldsmith in Treatment: 23 Electronic Signature(s) Signed: 11/18/2020 5:39:46 PM By: Baruch Gouty RN, BSN Entered By: Baruch Gouty on 11/18/2020 07:57:16 -------------------------------------------------------------------------------- Multi Wound Chart Details Patient Name: Date of Service: [name] EL L. 11/18/2020 8:00 A M Medical Record Number: [id number] Patient Account Number: [id number] Date of Birth/Sex: Treating RN: 1955-11-26 (65 y.o. Burnadette Pop, Lauren Primary Care Raymone Pembroke: [name] Other Clinician: Referring Lezette Kitts: Treating Katheryn Culliton/Extender: Randon Goldsmith in Treatment: 23 Vital Signs Height(in): 73 Pulse(bpm): 86 Weight(lbs): 250 Blood Pressure(mmHg): 120/83 Body Mass Index(BMI): 33 Temperature(F): 97.6 Respiratory Rate(breaths/min): 18 Photos: [3:No Photos Right, Distal Gluteus] [N/A:N/A N/A] Wound Location: [3:Gradually Appeared] [N/A:N/A] Wounding Event: [3:Pressure Ulcer] [N/A:N/A] Primary Etiology: [3:Cataracts, Sleep Apnea, Congestive] [N/A:N/A] Comorbid History: [3:Heart Failure, Hypertension, Hepatitis C, Type II Diabetes, End Stage Renal Disease, Neuropathy 03/15/2020] [N/A:N/A] Date Acquired: [3:23] [N/A:N/A] Weeks of Treatment: [3:Healed - Epithelialized] [N/A:N/A] Wound Status: [3:0x0x0] [N/A:N/A] Measurements L x W x D (cm) [3:0] [N/A:N/A] A (cm) : rea [3:0] [N/A:N/A] Volume (cm) : [3:100.00%] [N/A:N/A] % Reduction in Area: [3:100.00%] [N/A:N/A] % Reduction in Volume: [3:Category/Stage III] [N/A:N/A] Classification: [3:None Present] [N/A:N/A] Exudate Amount: [3:Flat and Intact] [N/A:N/A] Wound Margin: [3:None Present (0%)] [N/A:N/A] Granulation Amount: [3:None Present (0%)] [N/A:N/A] Necrotic Amount: [3:Fascia: No] [N/A:N/A] Exposed Structures: [3:Fat Layer (Subcutaneous Tissue): No Tendon: No Muscle: No Joint: No Bone: No Large (67-100%)] [N/A:N/A] Treatment  Notes Electronic Signature(s) Signed: 11/18/2020 4:50:25 PM By: Linton Ham MD Signed: 11/18/2020 5:38:12 PM By: Rhae Hammock RN Entered By: Linton Ham on 11/18/2020 08:24:14 -------------------------------------------------------------------------------- Multi-Disciplinary Care Plan Details Patient Name: Date of Service: [name] EL L. 11/18/2020 8:00 A M Medical Record Number: [id number] Patient Account Number: [id number] Date of Birth/Sex: Treating RN: 12/25/1955 (65 y.o. Burnadette Pop, Lauren Primary Care Owais Pruett: [name] Other Clinician: Referring Tyronda Vizcarrondo: Treating Shaquera Ansley/Extender: Linton Ham  Tessie Eke in Treatment: 23 Active Inactive Electronic Signature(s) Signed: 11/18/2020 5:38:12 PM By: Rhae Hammock RN Entered By: Rhae Hammock on 11/18/2020 08:07:57 -------------------------------------------------------------------------------- Pain Assessment Details Patient Name: Date of Service: [name] EL L. 11/18/2020 8:00 A M Medical Record Number: [id number] Patient Account Number: [id number] Date of Birth/Sex: Treating RN: 1956/01/25 (65 y.o. Ernestene Mention Primary Care Shomari Matusik: [name] Other Clinician: Referring Eavan Gonterman: Treating Ly Bacchi/Extender: Randon Goldsmith in Treatment: 23 Active Problems Location of Pain Severity and Description of Pain Patient Has Paino Yes Site Locations Pain Location: Pain Location: Generalized Pain With Dressing Change: No Rate the pain. Current Pain Level: 8 Pain Management and Medication Current Pain Management: Notes c/o chronic back pain Electronic Signature(s) Signed: 11/18/2020 5:39:46 PM By: Baruch Gouty RN, BSN Entered By: Baruch Gouty on 11/18/2020 07:54:40 -------------------------------------------------------------------------------- Patient/Caregiver Education Details Patient Name: Date of Service: [name] 1/4/2022andnbsp8:00  A M Medical Record Number: [id number] Patient Account Number: [id number] Date of Birth/Gender: Treating RN: [birth year] (65 y.o. [name] Primary Care Physician: [name] Other Clinician: Referring Physician: Treating Physician/Extender: Randon Goldsmith in Treatment: 23 Education Assessment Education Provided To: Patient Education Topics Provided Wound/Skin Impairment: Handouts: Caring for Your Ulcer Methods: Explain/Verbal Responses: State content correctly Electronic Signature(s) Signed: 11/18/2020 5:38:12 PM By: Rhae Hammock RN Entered By: Rhae Hammock on 11/18/2020 07:55:27 -------------------------------------------------------------------------------- Wound Assessment Details Patient Name: Date of Service: [name] EL L. 11/18/2020 8:00 A M Medical Record Number: [id number] Patient Account Number: [id number] Date of Birth/Sex: Treating RN: [birth year] (65 y.o. Ernestene Mention Primary Care Bobbyjoe Pabst: [name] Other Clinician: Referring Wei Poplaski: Treating Jayona Mccaig/Extender: Randon Goldsmith in Treatment: 23 Wound Status Wound Number: 3 Primary Pressure Ulcer Etiology: Wound Location: Right, Distal Gluteus Wound Healed - Epithelialized Wounding Event: Gradually Appeared Status: Date Acquired: 03/15/2020 Comorbid Cataracts, Sleep Apnea, Congestive Heart Failure, Hypertension, Weeks Of Treatment: 23 History: Hepatitis C, Type II Diabetes, End Stage Renal Disease, Clustered Wound: No Neuropathy Wound Measurements Length: (cm) Width: (cm) Depth: (cm) Area: (cm) Volume: (cm) 0 % Reduction in Area: 100% 0 % Reduction in Volume: 100% 0 Epithelialization: Large (67-100%) 0 Tunneling: No 0 Undermining: No Wound Description Classification: Category/Stage III Wound Margin: Flat and Intact Exudate Amount: None Present Foul Odor After Cleansing: No Slough/Fibrino No Wound Bed Granulation Amount:  None Present (0%) Exposed Structure Necrotic Amount: None Present (0%) Fascia Exposed: No Fat Layer (Subcutaneous Tissue) Exposed: No Tendon Exposed: No Muscle Exposed: No Joint Exposed: No Bone Exposed: No Electronic Signature(s) Signed: 11/18/2020 5:39:46 PM By: Baruch Gouty RN, BSN Entered By: Baruch Gouty on 11/18/2020 07:59:43 -------------------------------------------------------------------------------- Divide Details Patient Name: Date of Service: [name] EL L. 11/18/2020 8:00 A M Medical Record Number: [id number] Patient Account Number: [id number] Date of Birth/Sex: Treating RN: Apr 08, 1956 (65 y.o. Ernestene Mention Primary Care Melodi Happel: [name] Other Clinician: Referring Donis Kotowski: Treating Asahel Risden/Extender: Randon Goldsmith in Treatment: 23 Vital Signs Time Taken: 07:56 Temperature (F): 97.6 Height (in): 73 Pulse (bpm): 86 Source: Stated Respiratory Rate (breaths/min): 18 Weight (lbs): 250 Blood Pressure (mmHg): 120/83 Source: Stated Reference Range: 80 - 120 mg / dl Body Mass Index (BMI): 33 Electronic Signature(s) Signed: 11/18/2020 5:39:46 PM By: Baruch Gouty RN, BSN Entered By: Baruch Gouty on 11/18/2020 07:56:49

## 2020-12-23 ENCOUNTER — Ambulatory Visit (INDEPENDENT_AMBULATORY_CARE_PROVIDER_SITE_OTHER): Payer: PRIVATE HEALTH INSURANCE | Admitting: Podiatry

## 2020-12-23 ENCOUNTER — Encounter: Payer: Self-pay | Admitting: Podiatry

## 2020-12-23 ENCOUNTER — Other Ambulatory Visit: Payer: Self-pay

## 2020-12-23 DIAGNOSIS — B351 Tinea unguium: Secondary | ICD-10-CM | POA: Diagnosis not present

## 2020-12-23 DIAGNOSIS — E1142 Type 2 diabetes mellitus with diabetic polyneuropathy: Secondary | ICD-10-CM | POA: Diagnosis not present

## 2020-12-23 DIAGNOSIS — M79674 Pain in right toe(s): Secondary | ICD-10-CM | POA: Diagnosis not present

## 2020-12-23 DIAGNOSIS — M79675 Pain in left toe(s): Secondary | ICD-10-CM

## 2020-12-23 DIAGNOSIS — M14672 Charcot's joint, left ankle and foot: Secondary | ICD-10-CM

## 2020-12-23 NOTE — Patient Instructions (Signed)
For normal skin: Moisturize feet once daily; do not apply between toes A.  CeraVe Daily Moisturizing Lotion B.  Vaseline Intensive Care Lotion C.  Lubriderm Lotion D.  Gold Bond Diabetic Foot Lotion E.  Eucerin Intensive Repair Moisturizing Lotion  For extremely dry, cracked feet: moisturize feet once daily; do not apply between toes A. CeraVe Healing Ointment B. Aquaphor Healing Ointment C. Vaseline Petroleum Healing Jelly   If you have problems reaching your feet: apply to feet once daily; do not apply between toes A.  Aquaphor Advanced Therapy Ointment Body Spray B.  Vaseline Intensive Care Spray Lotion Advanced Repair    

## 2020-12-28 NOTE — Progress Notes (Signed)
Subjective: [name] presents today for follow up of at risk foot care with history of diabetic neuropathy and painful thick toenails that are difficult to trim. Pain interferes with ambulation. Aggravating factors include wearing enclosed shoe gear. Pain is relieved with periodic professional debridement.   Patient states he was advised to get compression hose.    Allergies  Allergen Reactions  . Celecoxib     Other reaction(s): stomach pain  . Other     Other reaction(s): vomiting  . Penicillin G Nausea And Vomiting    Other reaction(s): Nausea and vomiting, Low blood pressure  . Penicillin V     Other reaction(s): nausea, HA  . Penicillins Nausea And Vomiting and Other (See Comments)    Tolerated Augmentin. Has patient had a PCN reaction causing immediate rash, facial/tongue/throat swelling, SOB or lightheadedness with hypotension: No Has patient had a PCN reaction causing severe rash involving mucus membranes or skin necrosis: No Has patient had a PCN reaction that required hospitalization No Has patient had a PCN reaction occurring within the last 10 years: No If all of the above answers are "NO", then may proceed with Cephalosporin use.  Marland Kitchen Propolis Nausea And Vomiting  . Rofecoxib Nausea And Vomiting    Other reaction(s): Dizziness, Abdominal pain, Dizziness, Abdominal pain     Objective: There were no vitals filed for this visit.  Vascular Examination:  Capillary fill time to digits <3 seconds b/l lower extremities. Palpable pedal pulses b/l LE. Pedal hair absent. Lower extremity skin temperature gradient within normal limits. +1 pitting edema b/l lower extremities. Varicosities present b/l.Marland Kitchen  Dermatological Examination: Pedal skin with normal turgor, texture and tone bilaterally., No open wounds bilaterally. and No interdigital macerations bilaterally.  Toenails 1-5 b/l thickened, elongated, dystrophic, discolored with subungual debris and pain to palpation of nail  beds.  Musculoskeletal: Normal muscle strength 5/5 to all lower extremity muscle groups bilaterally, limited ROM left ankle and Charcot deformity left LE.  Neurological: Protective sensation diminished with 10g monofilament b/l. Vibratory sensation diminished b/l.  Assessment: 1. Pain due to onychomycosis of toenails of both feet   2. Charcot's joint of left foot   3. Diabetic peripheral neuropathy associated with type 2 diabetes mellitus ([place])     Plan: -Examined patient. -Continue diabetic foot care principles. -Toenails 1-5 b/l were debrided in length and girth with sterile nail nippers and dremel without iatrogenic bleeding.  -Patient to report any pedal injuries to medical professional immediately. -For dry skin, patient was given written list of OTC moisturizers. Patient/POA instructed to apply to foot/feet once daily avoiding application between toes.  -Advised patient if he gets compression hose to choose open toed compression hose to minimize pressure to his digits. He related understanding. -Toenails 1-5 b/l were debrided in length and girth without iatrogenic bleeding.  Return in about 3 months (around 03/22/2021).

## 2021-03-03 DIAGNOSIS — Z794 Long term (current) use of insulin: Secondary | ICD-10-CM | POA: Diagnosis not present

## 2021-03-03 DIAGNOSIS — E0842 Diabetes mellitus due to underlying condition with diabetic polyneuropathy: Secondary | ICD-10-CM | POA: Diagnosis not present

## 2021-03-03 DIAGNOSIS — M14672 Charcot's joint, left ankle and foot: Secondary | ICD-10-CM | POA: Diagnosis not present

## 2021-03-03 DIAGNOSIS — M545 Low back pain, unspecified: Secondary | ICD-10-CM | POA: Diagnosis not present

## 2021-03-03 DIAGNOSIS — I509 Heart failure, unspecified: Secondary | ICD-10-CM | POA: Diagnosis not present

## 2021-03-03 DIAGNOSIS — N186 End stage renal disease: Secondary | ICD-10-CM | POA: Diagnosis not present

## 2021-04-07 ENCOUNTER — Ambulatory Visit (INDEPENDENT_AMBULATORY_CARE_PROVIDER_SITE_OTHER): Payer: Medicare Other | Admitting: Podiatry

## 2021-04-07 ENCOUNTER — Encounter: Payer: Self-pay | Admitting: Podiatry

## 2021-04-07 ENCOUNTER — Other Ambulatory Visit: Payer: Self-pay

## 2021-04-07 DIAGNOSIS — M14672 Charcot's joint, left ankle and foot: Secondary | ICD-10-CM

## 2021-04-07 DIAGNOSIS — R0989 Other specified symptoms and signs involving the circulatory and respiratory systems: Secondary | ICD-10-CM | POA: Diagnosis not present

## 2021-04-07 DIAGNOSIS — E119 Type 2 diabetes mellitus without complications: Secondary | ICD-10-CM | POA: Diagnosis not present

## 2021-04-07 DIAGNOSIS — E1142 Type 2 diabetes mellitus with diabetic polyneuropathy: Secondary | ICD-10-CM

## 2021-04-07 DIAGNOSIS — B351 Tinea unguium: Secondary | ICD-10-CM | POA: Diagnosis not present

## 2021-04-07 DIAGNOSIS — B353 Tinea pedis: Secondary | ICD-10-CM | POA: Diagnosis not present

## 2021-04-07 MED ORDER — KETOCONAZOLE 2 % EX CREA: 1.0000 "application " | TOPICAL_CREAM | Freq: Every day | CUTANEOUS | 1 refills | Status: AC

## 2021-04-07 NOTE — Progress Notes (Signed)
ANNUAL DIABETIC FOOT EXAM  Subjective: [name] presents today for at risk foot care with history of diabetic neuropathy as well as Charcot neuroarthropathy left ankle and thick, elongated toenails b/l lower extremities which are tender when wearing enclosed shoe gear.  Patient relates 11 year h/o diabetes.  Patient denies any h/o foot wounds.  Patient does have symptoms of neuropathy and has been diagnosed/treated for Charcot neuroarthropathy of LLE. Patient states he was diagnosed with Charcot about 4 years ago. He wears a CROW walker on the LLE.  He just received a new one about 4 months ago from [facility].   Patient did not check blood glucose stating his glucometer is broken.  [name], MD is patient's PCP. Last visit was six weeks ago per patient recollection.  Past Medical History:  Diagnosis Date  . Alcohol abuse   . Congestive heart disease ([place]) 12/2011  . Diabetes mellitus   . GERD (gastroesophageal reflux disease)   . Gout   . Headache   . Hepatitis    being treated for Hepatitis C  . HOH (hard of hearing)    occasional wears hearing aids  . Hyperlipidemia   . Hyperlipidemia   . Hypertension   . Monoclonal gammopathies 02/25/2012  . Obesity   . Pedal edema   . Pneumonia   . Renal insufficiency    MWD - [facility]  . Sleep apnea    does not use cpap  . Tobacco abuse   . Wears hearing aid    b/l   Patient Active Problem List   Diagnosis Date Noted  . Pressure injury of skin 01/12/2020  . Pneumonia due to COVID-19 virus 01/11/2020  . Hyponatremia 01/11/2020  . Hyperkalemia 01/11/2020  . Anemia 01/11/2020  . Respiratory failure with hypoxia ([place]) 01/11/2020  . BMI 37.0-37.9, adult 01/11/2020  . Effusion of lower leg joint 07/13/2018  . Absolute anemia 07/13/2018  . Benign essential HTN 07/13/2018  . Carpal tunnel syndrome of right wrist 05/19/2017  . Hand pain, right 05/10/2017  . Gram-neg septicemia ([place])   . Scrotal edema   . ESRD  (end stage renal disease) on dialysis ([place])   . Scrotal pain   . Sepsis ([place])   . Acute respiratory failure with hypoxia ([place]) 03/04/2017  . Scrotal swelling 03/04/2017  . Chronic hepatitis C without hepatic coma ([place]) 11/17/2016  . Pulmonary nodule 04/02/2014  . Sleep apnea 04/02/2014  . Allergic rhinitis 04/02/2014  . End stage renal disease ([place]) 02/13/2014  . Diabetes mellitus type 2, uncontrolled ([place]) 01/21/2014  . PNA (pneumonia) 01/21/2014  . Pedal edema   . Monoclonal gammopathies 02/25/2012  . Obesity 01/10/2012  . Chronic kidney disease 01/10/2012  . Diabetes mellitus ([place]) 01/10/2012  . Hypertension   . Hyperlipidemia   . Gout   . Alcohol abuse   . Tobacco abuse    Past Surgical History:  Procedure Laterality Date  . AV FISTULA PLACEMENT Right 01/10/2014   Procedure: ARTERIOVENOUS (AV) FISTULA CREATION- RIGHT RADIOCEPHALIC ;  Surgeon: Angelia Mould, MD;  Location: [place];  Service: Vascular;  Laterality: Right;  . BASCILIC VEIN TRANSPOSITION Right 03/06/2020   Procedure: RIGHT ARM BASILIC VEIN TRANSPOSITION FISTULA;  Surgeon: Rosetta Posner, MD;  Location: [place];  Service: Vascular;  Laterality: Right;  . COLONOSCOPY    . EYE SURGERY     cataract surgery, bilateral  . Ganglion cyst removal x 2    . INSERTION OF DIALYSIS CATHETER N/A 01/07/2014  Procedure: INSERTION OF DIALYSIS CATHETER;  Surgeon: Conrad [place]stown, MD;  Location: [place];  Service: Vascular;  Laterality: N/A;  . IR FLUORO GUIDE CV LINE LEFT  01/18/2020  . IR REMOVAL TUN CV CATH W/O FL  01/16/2020  . IR US GUIDE VASC ACCESS LEFT  01/18/2020  . kidney remvoval Right 09/2019   [place]  . Left knee arthroscopy with generalized joint synovectomy,  01/2006  . LIGATION OF COMPETING BRANCHES OF ARTERIOVENOUS FISTULA Right 05/28/2014   Procedure: LIGATION OF COMPETING BRANCHES OF ARTERIOVENOUS FISTULA;  Surgeon: Angelia Mould, MD;  Location: [place];  Service: Vascular;  Laterality: Right;  .  REVISON OF ARTERIOVENOUS FISTULA Right 0/07/3266   Procedure: PLICATION REPAIR OF ARTERIOVENOUS FISTULA PSEUDOANEURYSM;  Surgeon: Serafina Mitchell, MD;  Location: [place];  Service: Vascular;  Laterality: Right;  . REVISON OF ARTERIOVENOUS FISTULA Right 08/08/2018   Procedure: REVISION PLICATION OF ARTERIOVENOUS FISTULA FOREARM;  Surgeon: Angelia Mould, MD;  Location: [place];  Service: Vascular;  Laterality: Right;  . REVISON OF ARTERIOVENOUS FISTULA Right 08/02/2019   Procedure: Artegraft placement right arm arteriovenous graft;  Surgeon: Waynetta Sandy, MD;  Location: [place];  Service: Vascular;  Laterality: Right;  . SCROTAL EXPLORATION N/A 03/11/2017   Procedure: SCROTUM EXPLORATION AND DEBRIDEMENT;  Surgeon: Franchot Gallo, MD;  Location: [place];  Service: Urology;  Laterality: N/A;  . SCROTAL EXPLORATION N/A 03/21/2017   Procedure: REPEAT SCROTAL DEBRIDEMENT;  Surgeon: Franchot Gallo, MD;  Location: [place];  Service: Urology;  Laterality: N/A;  . SHUNTOGRAM Right 05/14/2014   Procedure: FISTULOGRAM;  Surgeon: Serafina Mitchell, MD;  Location: [facility] CATH LAB;  Service: Cardiovascular;  Laterality: Right;  . THROMBECTOMY W/ EMBOLECTOMY Right 07/04/2017   Procedure: EXPLORATION AND THROMBECTOMY ARTERIOVENOUS FISTULA RIGHT ARM;  Surgeon: Rosetta Posner, MD;  Location: [facility];  Service: Vascular;  Laterality: Right;   Current Outpatient Medications on File Prior to Visit  Medication Sig Dispense Refill  . acetaminophen (TYLENOL) 500 MG tablet Take by mouth.    Marland Kitchen allopurinol (ZYLOPRIM) 100 MG tablet Take 1 tablet (100 mg total) by mouth daily. (Patient taking differently: Take 100 mg by mouth 2 (two) times daily. ) 30 tablet 2  . allopurinol (ZYLOPRIM) 100 MG tablet 2 tablets    . amLODipine (NORVASC) 5 MG tablet Take 5 mg by mouth daily.    Marland Kitchen amLODipine (NORVASC) 5 MG tablet 1 tablet    . carboxymethylcellulose (REFRESH PLUS) 0.5 % SOLN INSTILL 1 DROP IN BOTH EYES FOUR TIMES A DAY AS  NEEDED    . Carboxymethylcellulose Sodium 1 % GEL APPLY 1 DROP TO EACH EYE AT BEDTIME    . carvedilol (COREG) 12.5 MG tablet Take 0.5 tablets by mouth 2 (two) times daily.    . carvedilol (COREG) 3.125 MG tablet Take 3.125 mg by mouth 2 (two) times daily.    . cinacalcet (SENSIPAR) 60 MG tablet Take 2 tablets by mouth daily.    . colchicine 0.6 MG tablet Take 0.6 mg by mouth daily.    Marland Kitchen dicyclomine (BENTYL) 20 MG tablet Take 1 tablet by mouth 3 (three) times daily.    . diphenhydrAMINE (BENADRYL) 25 mg capsule 1 capsule    . diphenhydrAMINE (BENADRYL) 25 MG tablet Take 25 mg by mouth every Monday, Wednesday, and Friday.    Marland Kitchen doxycycline (MONODOX) 100 MG capsule Take 100 mg by mouth 2 (two) times daily.    . ferric citrate (AURYXIA) 1 GM 210  MG(Fe) tablet 2 tablets with meals    . furosemide (LASIX) 40 MG tablet Take by mouth.    Marland Kitchen gentamicin cream (GARAMYCIN) 0.1 % Apply 1 application topically 2 (two) times daily. 30 g 1  . Insulin Detemir (LEVEMIR FLEXTOUCH) 100 UNIT/ML Pen Inject 20 Units into the skin daily.     Marland Kitchen levofloxacin (LEVAQUIN) 500 MG tablet Take 500 mg by mouth daily.    Marland Kitchen omeprazole (PRILOSEC) 20 MG capsule TAKE ONE CAPSULE BY MOUTH DAILY FOR REFLUX/HEARTBURN (TAKE ON AN EMPTY STOMACH 30 MINUTES PRIOR TO A MEAL)    . ondansetron (ZOFRAN) 4 MG tablet 1 tablet    . peg 3350 powder (MOVIPREP) 100 g SOLR TAKE 1 KIT BY MOUTH ONCE    . pravastatin (PRAVACHOL) 20 MG tablet Take 20 mg by mouth daily.     Marland Kitchen SANTYL ointment Apply topically.    . sevelamer carbonate (RENVELA) 800 MG tablet TAKE ONE TABLET BY MOUTH THREE TIMES A DAY BEFORE MEALS    . sildenafil (VIAGRA) 25 MG tablet TAKE ONE-HALF TABLET BY MOUTH AS DIRECTED (TAKE 1 HOUR PRIOR TO SEXUAL ACTIVITY *DO NOT EXCEED 1 DOSE PER 24 HOUR PERIOD*)     No current facility-administered medications on file prior to visit.    Allergies  Allergen Reactions  . Celecoxib     Other reaction(s): stomach pain  . Other     Other  reaction(s): vomiting  . Penicillin G Nausea And Vomiting    Other reaction(s): Nausea and vomiting, Low blood pressure  . Penicillin V     Other reaction(s): nausea, HA  . Penicillins Nausea And Vomiting and Other (See Comments)    Tolerated Augmentin. Has patient had a PCN reaction causing immediate rash, facial/tongue/throat swelling, SOB or lightheadedness with hypotension: No Has patient had a PCN reaction causing severe rash involving mucus membranes or skin necrosis: No Has patient had a PCN reaction that required hospitalization No Has patient had a PCN reaction occurring within the last 10 years: No If all of the above answers are "NO", then may proceed with Cephalosporin use.  Marland Kitchen Propolis Nausea And Vomiting  . Rofecoxib Nausea And Vomiting    Other reaction(s): Dizziness, Abdominal pain, Dizziness, Abdominal pain   Social History   Occupational History  . Occupation: Security  Tobacco Use  . Smoking status: Former Smoker    Packs/day: 0.30    Years: 33.00    Pack years: 9.90    Types: Cigarettes    Quit date: 07/16/2010    Years since quitting: 10.7  . Smokeless tobacco: Never Used  Vaping Use  . Vaping Use: Never used  Substance and Sexual Activity  . Alcohol use: No  . Drug use: No  . Sexual activity: Yes   Family History  Problem Relation Age of Onset  . Diabetes Mother   . Heart disease Mother   . Lung cancer Father    Immunization History  Administered Date(s) Administered  . Hepatitis B, adult 01/30/2014, 05/27/2014, 07/26/2014, 12/15/2015, 12/19/2019  . Influenza Split 12/16/2013  . Influenza, Seasonal, Injecte, Preservative Fre 08/15/2016  . Influenza,inj,Quad PF,6+ Mos 08/13/2019  . Influenza,inj,quad, With Preservative 08/09/2018  . Moderna Sars-Covid-2 Vaccination 03/17/2020  . Pneumococcal Conjugate-13 01/22/2015  . Pneumococcal Polysaccharide-23 01/05/2014, 02/14/2019     Review of Systems: Negative except as noted in the HPI.   Objective: There were no vitals filed for this visit.  [name] is a pleasant 66 y.o. African American male, morbidly obese in  NAD. AAO X 3.  Vascular Examination: Capillary refill time to digits <5 seconds b/l lower extremities. Nonpalpable pedal pulse(s) b/l lower extremities. Pedal hair absent. Lower extremity skin temperature gradient warm to cool. No pain with calf compression b/l. Trace edema noted b/l lower extremities. No ischemia or gangrene noted b/l lower extremities.  Dermatological Examination: Pedal skin with normal turgor, texture and tone bilaterally. No open wounds bilaterally. No interdigital macerations bilaterally. Toenails 1-5 b/l elongated, discolored, dystrophic, thickened, crumbly with subungual debris and tenderness to dorsal palpation. No hyperkeratotic nor porokeratotic lesions present on today's visit. Diffuse scaling noted peripherally and plantarly b/l feet with mild foot odor.  No interdigital macerations.  No blisters, no weeping. No signs of secondary bacterial infection noted.   Musculoskeletal Examination: Normal muscle strength 5/5 to all lower extremity muscle groups bilaterally. Utilized CROW walker LLE. Charcot deformity noted LLE.  Footwear Assessment: Does the patient wear appropriate shoes? Yes. Does the patient need inserts/orthotics? Yes.  Neurological Examination: Protective sensation diminished with 10g monofilament b/l. Vibratory sensation decreased right lower extremity. Vibratory sensation diminished left lower extremity.  Assessment: 1. Diabetic peripheral neuropathy ([place])   2. Onychomycosis   3. Diminished pulses in lower extremity   4. Tinea pedis of both feet   5. Charcot's joint of left foot   6. Encounter for diabetic foot exam ([place])     High Risk  Patient has one or more of the following: Loss of protective sensation Absent pedal pulses Severe Foot deformity History of foot ulcer  Plan: -Examined patient. -Diabetic  foot examination performed on today's visit. -Patient to continue soft, supportive shoe gear daily. -Toenails 1-5 b/l were debrided in length and girth with sterile nail nippers and dremel without iatrogenic bleeding.  -Patient to report any pedal injuries to medical professional immediately. -For tinea pedis, prescription sent to pharmacy for Ketoconazole Cream 2% to be applied to both feet and between toes qd x six weeks. -For diminished pedal pulses, ordered ABIs and segmental pressures b/l LE. -Patient/POA to call should there be question/concern in the interim.  Return in about 3 months (around 07/08/2021) for diabetic nail trim.  Marzetta Board, DPM

## 2021-04-09 DIAGNOSIS — T8249XA Other complication of vascular dialysis catheter, initial encounter: Secondary | ICD-10-CM | POA: Diagnosis not present

## 2021-04-09 DIAGNOSIS — Z992 Dependence on renal dialysis: Secondary | ICD-10-CM | POA: Diagnosis not present

## 2021-04-09 DIAGNOSIS — N186 End stage renal disease: Secondary | ICD-10-CM | POA: Diagnosis not present

## 2021-04-28 DIAGNOSIS — Z1211 Encounter for screening for malignant neoplasm of colon: Secondary | ICD-10-CM | POA: Diagnosis not present

## 2021-04-28 DIAGNOSIS — K648 Other hemorrhoids: Secondary | ICD-10-CM | POA: Diagnosis not present

## 2021-04-28 DIAGNOSIS — K219 Gastro-esophageal reflux disease without esophagitis: Secondary | ICD-10-CM | POA: Diagnosis not present

## 2021-04-28 DIAGNOSIS — M109 Gout, unspecified: Secondary | ICD-10-CM | POA: Diagnosis not present

## 2021-04-28 DIAGNOSIS — K5730 Diverticulosis of large intestine without perforation or abscess without bleeding: Secondary | ICD-10-CM | POA: Diagnosis not present

## 2021-04-28 DIAGNOSIS — Z794 Long term (current) use of insulin: Secondary | ICD-10-CM | POA: Diagnosis not present

## 2021-04-28 DIAGNOSIS — N186 End stage renal disease: Secondary | ICD-10-CM | POA: Diagnosis not present

## 2021-04-28 DIAGNOSIS — Z992 Dependence on renal dialysis: Secondary | ICD-10-CM | POA: Diagnosis not present

## 2021-04-28 DIAGNOSIS — E785 Hyperlipidemia, unspecified: Secondary | ICD-10-CM | POA: Diagnosis not present

## 2021-04-28 DIAGNOSIS — E1122 Type 2 diabetes mellitus with diabetic chronic kidney disease: Secondary | ICD-10-CM | POA: Diagnosis not present

## 2021-04-28 DIAGNOSIS — Z79899 Other long term (current) drug therapy: Secondary | ICD-10-CM | POA: Diagnosis not present

## 2021-07-14 ENCOUNTER — Encounter: Payer: Self-pay | Admitting: Podiatry

## 2021-07-14 ENCOUNTER — Ambulatory Visit (INDEPENDENT_AMBULATORY_CARE_PROVIDER_SITE_OTHER): Payer: Medicare Other | Admitting: Podiatry

## 2021-07-14 ENCOUNTER — Other Ambulatory Visit: Payer: Self-pay

## 2021-07-14 DIAGNOSIS — F4323 Adjustment disorder with mixed anxiety and depressed mood: Secondary | ICD-10-CM | POA: Insufficient documentation

## 2021-07-14 DIAGNOSIS — Z961 Presence of intraocular lens: Secondary | ICD-10-CM | POA: Insufficient documentation

## 2021-07-14 DIAGNOSIS — E1142 Type 2 diabetes mellitus with diabetic polyneuropathy: Secondary | ICD-10-CM | POA: Diagnosis not present

## 2021-07-14 DIAGNOSIS — E559 Vitamin D deficiency, unspecified: Secondary | ICD-10-CM | POA: Insufficient documentation

## 2021-07-14 DIAGNOSIS — L853 Xerosis cutis: Secondary | ICD-10-CM | POA: Diagnosis not present

## 2021-07-14 DIAGNOSIS — B353 Tinea pedis: Secondary | ICD-10-CM

## 2021-07-14 DIAGNOSIS — B351 Tinea unguium: Secondary | ICD-10-CM

## 2021-07-14 DIAGNOSIS — K029 Dental caries, unspecified: Secondary | ICD-10-CM | POA: Insufficient documentation

## 2021-07-14 MED ORDER — KETOCONAZOLE 2 % EX CREA: TOPICAL_CREAM | CUTANEOUS | 1 refills | Status: AC

## 2021-07-14 MED ORDER — AMMONIUM LACTATE 12 % EX LOTN: 1.0000 "application " | TOPICAL_LOTION | Freq: Two times a day (BID) | CUTANEOUS | 6 refills | Status: DC

## 2021-07-16 NOTE — Progress Notes (Signed)
ANNUAL DIABETIC FOOT EXAM  Subjective: [name] presents today for at risk foot care with history of diabetic neuropathy as well as Charcot neuroarthropathy left ankle and thick, elongated toenails b/l lower extremities which are tender when wearing enclosed shoe gear.  He just received a new CROW Walker in January of 2022 from [facility]   Patient did not check blood glucose stating his glucometer is broken.  Nicholas Contras, MD is patient's PCP. Last visit was three months ago.  [name] is requesting cream for his scaling skin on his feet. States last prescription cleared it up, but it has started again. Denies any redness, blistering, swelling, or drainage.  Allergies  Allergen Reactions   Celecoxib     Other reaction(s): stomach pain   Contrast Media [Iodinated Diagnostic Agents] Nausea And Vomiting   Other     Other reaction(s): vomiting   Penicillin G Nausea And Vomiting    Other reaction(s): Nausea and vomiting, Low blood pressure   Penicillin V     Other reaction(s): nausea, HA   Penicillins Nausea And Vomiting and Other (See Comments)    Tolerated Augmentin. Has patient had a PCN reaction causing immediate rash, facial/tongue/throat swelling, SOB or lightheadedness with hypotension: No Has patient had a PCN reaction causing severe rash involving mucus membranes or skin necrosis: No Has patient had a PCN reaction that required hospitalization No Has patient had a PCN reaction occurring within the last 10 years: No If all of the above answers are "NO", then may proceed with Cephalosporin use.   Propolis Nausea And Vomiting   Rofecoxib Nausea And Vomiting    Other reaction(s): Dizziness, Abdominal pain, Dizziness, Abdominal pain   Review of Systems: Negative except as noted in the HPI.  Objective: There were no vitals filed for this visit.  [name] is a pleasant 65 y.o. African American male, morbidly obese in NAD. AAO X 3.  Vascular  Examination: Capillary refill time to digits <5 seconds b/l lower extremities. Nonpalpable pedal pulse(s) b/l lower extremities. Pedal hair absent. Lower extremity skin temperature gradient warm to cool. No pain with calf compression b/l. Trace edema noted b/l lower extremities. Varicosities present b/l. Evidence of chronic venous insufficiency b/l lower extremities. No ischemia or gangrene noted b/l lower extremities.  Dermatological Examination: Pedal skin with normal turgor, texture and tone b/l lower extremities. No open wounds b/l lower extremities. No interdigital macerations b/l lower extremities. Toenails 1-5 b/l elongated, discolored, dystrophic, thickened, crumbly with subungual debris and tenderness to dorsal palpation. No hyperkeratotic nor porokeratotic lesions present on today's visit. Skin changes consistent with venous stasis noted b/l lower extremities. Hyperpigmentation consistent with findings of chronic venous insufficiency is present b/l lower extremities. Diffuse scaling noted peripherally and plantarly b/l feet with mild foot odor.  No interdigital macerations.  No blisters, no weeping. No signs of secondary bacterial infection noted.   Musculoskeletal Examination: Normal muscle strength 5/5 to all lower extremity muscle groups bilaterally. Utilized CROW walker LLE. Charcot deformity noted LLE.  Neurological Examination: Protective sensation diminished with 10g monofilament b/l. Vibratory sensation decreased right lower extremity. Vibratory sensation diminished left lower extremity.  Assessment: 1. Onychomycosis   2. Tinea pedis of both feet   3. Xerosis cutis   4. Diabetic peripheral neuropathy (HCC)     High Risk  Patient has one or more of the following: Loss of protective sensation Absent pedal pulses Severe Foot deformity History of foot ulcer  Plan: -Examined patient. -New orders written on  today's visit. -Patient to continue soft, supportive shoe gear daily.  Continue CROW walker for Charcot deformity LLE. -Toenails 1-5 b/l were debrided in length and girth with sterile nail nippers and dremel without iatrogenic bleeding.  -Patient to report any pedal injuries to medical professional immediately. -For tinea pedis, prescription sent to [facility] for Ketoconazole Cream 2% to be applied to both feet and between toes qd x six weeks. -Rx sent to [facility] for ammonium lactate 12% to be applied to feet twice daily. Patient instructed to avoid application between toes. -For diminished pedal pulses, ordered ABIs and segmental pressures b/l LE. -Patient/POA to call should there be question/concern in the interim.  Return in about 3 months (around 10/14/2021).  Marzetta Board, DPM

## 2021-10-05 DIAGNOSIS — T82898A Other specified complication of vascular prosthetic devices, implants and grafts, initial encounter: Secondary | ICD-10-CM | POA: Diagnosis not present

## 2021-10-05 DIAGNOSIS — T82858A Stenosis of vascular prosthetic devices, implants and grafts, initial encounter: Secondary | ICD-10-CM | POA: Diagnosis not present

## 2021-10-15 DIAGNOSIS — Z8616 Personal history of COVID-19: Secondary | ICD-10-CM | POA: Diagnosis not present

## 2021-10-15 DIAGNOSIS — Z0000 Encounter for general adult medical examination without abnormal findings: Secondary | ICD-10-CM | POA: Diagnosis not present

## 2021-10-15 DIAGNOSIS — I10 Essential (primary) hypertension: Secondary | ICD-10-CM | POA: Diagnosis not present

## 2021-10-15 DIAGNOSIS — M14672 Charcot's joint, left ankle and foot: Secondary | ICD-10-CM | POA: Diagnosis not present

## 2021-10-15 DIAGNOSIS — E0842 Diabetes mellitus due to underlying condition with diabetic polyneuropathy: Secondary | ICD-10-CM | POA: Diagnosis not present

## 2021-10-15 DIAGNOSIS — E1122 Type 2 diabetes mellitus with diabetic chronic kidney disease: Secondary | ICD-10-CM | POA: Diagnosis not present

## 2021-10-15 DIAGNOSIS — E782 Mixed hyperlipidemia: Secondary | ICD-10-CM | POA: Diagnosis not present

## 2021-10-15 DIAGNOSIS — Z1389 Encounter for screening for other disorder: Secondary | ICD-10-CM | POA: Diagnosis not present

## 2021-10-15 DIAGNOSIS — I700 Atherosclerosis of aorta: Secondary | ICD-10-CM | POA: Diagnosis not present

## 2021-10-15 DIAGNOSIS — D472 Monoclonal gammopathy: Secondary | ICD-10-CM | POA: Diagnosis not present

## 2021-10-15 DIAGNOSIS — N186 End stage renal disease: Secondary | ICD-10-CM | POA: Diagnosis not present

## 2021-10-15 DIAGNOSIS — I509 Heart failure, unspecified: Secondary | ICD-10-CM | POA: Diagnosis not present

## 2021-10-20 ENCOUNTER — Ambulatory Visit (INDEPENDENT_AMBULATORY_CARE_PROVIDER_SITE_OTHER): Payer: Non-veteran care | Admitting: Podiatry

## 2021-10-20 DIAGNOSIS — Z91199 Patient's noncompliance with other medical treatment and regimen due to unspecified reason: Secondary | ICD-10-CM

## 2021-10-20 NOTE — Progress Notes (Signed)
No show for appointment. Patient being prepared for kidney transplant. No charge.

## 2021-12-29 ENCOUNTER — Encounter: Payer: Self-pay | Admitting: Podiatry

## 2021-12-29 ENCOUNTER — Ambulatory Visit (INDEPENDENT_AMBULATORY_CARE_PROVIDER_SITE_OTHER): Payer: Non-veteran care | Admitting: Podiatry

## 2021-12-29 ENCOUNTER — Other Ambulatory Visit: Payer: Self-pay

## 2021-12-29 DIAGNOSIS — B351 Tinea unguium: Secondary | ICD-10-CM | POA: Diagnosis not present

## 2021-12-29 DIAGNOSIS — E1142 Type 2 diabetes mellitus with diabetic polyneuropathy: Secondary | ICD-10-CM | POA: Diagnosis not present

## 2021-12-29 DIAGNOSIS — I2510 Atherosclerotic heart disease of native coronary artery without angina pectoris: Secondary | ICD-10-CM | POA: Insufficient documentation

## 2021-12-29 DIAGNOSIS — L853 Xerosis cutis: Secondary | ICD-10-CM | POA: Diagnosis not present

## 2021-12-29 MED ORDER — AMMONIUM LACTATE 12 % EX LOTN: 1.0000 "application " | TOPICAL_LOTION | Freq: Two times a day (BID) | CUTANEOUS | 11 refills | Status: AC

## 2022-01-04 NOTE — Progress Notes (Signed)
°  Subjective:  Patient ID: [name], male    DOB: 23-Oct-1956,  MRN: [id number]  [name] presents to clinic today for at risk foot care with history of diabetic neuropathy and painful thick toenails that are difficult to trim. Pain interferes with ambulation. Aggravating factors include wearing enclosed shoe gear. Pain is relieved with periodic professional debridement.   Patient did not check blood glucose today.  Patient's wife is present during today's visit. Mr. Kinker is requesting refill of ammonium lactate lotion 12%.  PCP is [name], MD , and last visit was August 27, 2021.  Allergies  Allergen Reactions   Celecoxib     Other reaction(s): stomach pain   Contrast Media [Iodinated Contrast Media] Nausea And Vomiting   Other     Other reaction(s): vomiting   Penicillin G Nausea And Vomiting    Other reaction(s): Nausea and vomiting, Low blood pressure   Penicillin V     Other reaction(s): nausea, HA   Penicillins Nausea And Vomiting and Other (See Comments)    Tolerated Augmentin. Has patient had a PCN reaction causing immediate rash, facial/tongue/throat swelling, SOB or lightheadedness with hypotension: No Has patient had a PCN reaction causing severe rash involving mucus membranes or skin necrosis: No Has patient had a PCN reaction that required hospitalization No Has patient had a PCN reaction occurring within the last 10 years: No If all of the above answers are "NO", then may proceed with Cephalosporin use.   Propolis Nausea And Vomiting   Rofecoxib Nausea And Vomiting    Other reaction(s): Dizziness, Abdominal pain, Dizziness, Abdominal pain Other reaction(s): Abdominal pain, Dizziness    Review of Systems: Negative except as noted in the HPI. Objective:   Constitutional [name] is a pleasant 66 y.o. African American male, morbidly obese in NAD. AAO x 3.   Vascular CFT <5 seconds b/l LE. Diminished pedal pulses b/l LE. Pedal hair absent. No  pain with calf compression b/l. Lower extremity skin temperature gradient within normal limits. Dependent edema noted b/l LE. Evidence of chronic venous insufficiency b/l LE. No ischemia or gangrene noted b/l LE. No cyanosis or clubbing noted b/l LE.  Neurologic Normal speech. Oriented to person, place, and time. Protective sensation diminished with 10g monofilament b/l. Vibratory sensation diminished b/l.  Dermatologic Pedal skin thin, shiny and atrophic b/l LE. No open wounds b/l LE. No interdigital macerations noted b/l LE. Toenails 1-5 b/l elongated, discolored, dystrophic, thickened, crumbly with subungual debris and tenderness to dorsal palpation. No hyperkeratotic nor porokeratotic lesions present on today's visit. Pedal skin noted to be dry b/l lower extremities.  Orthopedic: Muscle strength 5/5 to all lower extremity muscle groups bilaterally. Wearing CROW Walker LLE. Charcot deformity noted left lower extremity.   Radiographs: None Assessment:   1. Onychomycosis   2. Xerosis cutis   3. Diabetic peripheral neuropathy ([place])    Plan:  Patient was evaluated and treated and all questions answered. Consent given for treatment as described below: -Refilled Amlactin Lotion. -Toenails 1-5 b/l were debrided in length and girth with sterile nail nippers and dremel without iatrogenic bleeding.  -Patient/POA to call should there be question/concern in the interim.  Return in about 3 months (around 03/28/2022).  Marzetta Board, DPM

## 2022-03-30 ENCOUNTER — Ambulatory Visit (INDEPENDENT_AMBULATORY_CARE_PROVIDER_SITE_OTHER): Payer: Self-pay | Admitting: Podiatry

## 2022-03-30 DIAGNOSIS — Z91199 Patient's noncompliance with other medical treatment and regimen due to unspecified reason: Secondary | ICD-10-CM

## 2022-03-30 NOTE — Progress Notes (Signed)
   Complete physical exam  Patient: [name]   DOB: 09/04/1998   66 y.o. Male  MRN: [id number]  Subjective:    No chief complaint on file.   [name] is a 66 y.o. male who presents today for a complete physical exam. She reports consuming a {diet types:17450} diet. {types:19826} She generally feels {DESC; WELL/FAIRLY WELL/POORLY:18703}. She reports sleeping {DESC; WELL/FAIRLY WELL/POORLY:18703}. She {does/does not:200015} have additional problems to discuss today.    Most recent fall risk assessment:    05/12/2022   10:42 AM  Fall Risk   Falls in the past year? 0  Number falls in past yr: 0  Injury with Fall? 0  Risk for fall due to : No Fall Risks  Follow up Falls evaluation completed     Most recent depression screenings:    05/12/2022   10:42 AM 04/02/2021   10:46 AM  PHQ 2/9 Scores  PHQ - 2 Score 0 0  PHQ- 9 Score 5     {VISON DENTAL STD PSA (Optional):27386}  {History (Optional):23778}  Patient Care Team: Jessup, Joy, NP as PCP - General (Nurse Practitioner)   Outpatient Medications Prior to Visit  Medication Sig   fluticasone (FLONASE) 50 MCG/ACT nasal spray Place 2 sprays into both nostrils in the morning and at bedtime. After 7 days, reduce to once daily.   norgestimate-ethinyl estradiol (SPRINTEC 28) 0.25-35 MG-MCG tablet Take 1 tablet by mouth daily.   Nystatin POWD Apply liberally to affected area 2 times per day   spironolactone (ALDACTONE) 100 MG tablet Take 1 tablet (100 mg total) by mouth daily.   No facility-administered medications prior to visit.    ROS        Objective:     There were no vitals taken for this visit. {Vitals History (Optional):23777}  Physical Exam   No results found for any visits on 06/17/22. {Show previous labs (optional):23779}    Assessment & Plan:    Routine Health Maintenance and Physical Exam  Immunization History  Administered Date(s) Administered   DTaP 11/18/1999, 01/14/2000,  03/24/2000, 12/08/2000, 06/23/2004   Hepatitis A 04/19/2008, 04/25/2009   Hepatitis B 09/05/1999, 10/13/1999, 03/24/2000   HiB (PRP-OMP) 11/18/1999, 01/14/2000, 03/24/2000, 12/08/2000   IPV 11/18/1999, 01/14/2000, 09/12/2000, 06/23/2004   Influenza,inj,Quad PF,6+ Mos 07/26/2014   Influenza-Unspecified 10/25/2012   MMR 09/12/2001, 06/23/2004   Meningococcal Polysaccharide 04/24/2012   Pneumococcal Conjugate-13 12/08/2000   Pneumococcal-Unspecified 03/24/2000, 06/07/2000   Tdap 04/24/2012   Varicella 09/12/2000, 04/19/2008    Health Maintenance  Topic Date Due   HIV Screening  Never done   Hepatitis C Screening  Never done   INFLUENZA VACCINE  06/15/2022   PAP-Cervical Cytology Screening  06/17/2022 (Originally 09/03/2020)   PAP SMEAR-Modifier  06/17/2022 (Originally 09/03/2020)   TETANUS/TDAP  06/17/2022 (Originally 04/24/2022)   HPV VACCINES  Discontinued   COVID-19 Vaccine  Discontinued    Discussed health benefits of physical activity, and encouraged her to engage in regular exercise appropriate for her age and condition.  Problem List Items Addressed This Visit   None Visit Diagnoses     Annual physical exam    -  Primary   Cervical cancer screening       Need for Tdap vaccination          No follow-ups on file.     Joy Jessup, NP   

## 2022-04-21 DIAGNOSIS — I10 Essential (primary) hypertension: Secondary | ICD-10-CM | POA: Diagnosis not present

## 2022-04-21 DIAGNOSIS — E1122 Type 2 diabetes mellitus with diabetic chronic kidney disease: Secondary | ICD-10-CM | POA: Diagnosis not present

## 2022-04-21 DIAGNOSIS — I953 Hypotension of hemodialysis: Secondary | ICD-10-CM | POA: Diagnosis not present

## 2022-04-21 DIAGNOSIS — Z91041 Radiographic dye allergy status: Secondary | ICD-10-CM | POA: Diagnosis not present

## 2022-04-21 DIAGNOSIS — E119 Type 2 diabetes mellitus without complications: Secondary | ICD-10-CM | POA: Diagnosis not present

## 2022-04-21 DIAGNOSIS — K219 Gastro-esophageal reflux disease without esophagitis: Secondary | ICD-10-CM | POA: Diagnosis not present

## 2022-04-21 DIAGNOSIS — I1311 Hypertensive heart and chronic kidney disease without heart failure, with stage 5 chronic kidney disease, or end stage renal disease: Secondary | ICD-10-CM | POA: Diagnosis not present

## 2022-04-21 DIAGNOSIS — E1165 Type 2 diabetes mellitus with hyperglycemia: Secondary | ICD-10-CM | POA: Diagnosis not present

## 2022-04-21 DIAGNOSIS — E8770 Fluid overload, unspecified: Secondary | ICD-10-CM | POA: Diagnosis not present

## 2022-04-21 DIAGNOSIS — R001 Bradycardia, unspecified: Secondary | ICD-10-CM | POA: Diagnosis not present

## 2022-04-21 DIAGNOSIS — D649 Anemia, unspecified: Secondary | ICD-10-CM | POA: Diagnosis not present

## 2022-04-21 DIAGNOSIS — N269 Renal sclerosis, unspecified: Secondary | ICD-10-CM | POA: Diagnosis not present

## 2022-04-21 DIAGNOSIS — I120 Hypertensive chronic kidney disease with stage 5 chronic kidney disease or end stage renal disease: Secondary | ICD-10-CM | POA: Diagnosis not present

## 2022-04-21 DIAGNOSIS — Z7682 Awaiting organ transplant status: Secondary | ICD-10-CM | POA: Diagnosis not present

## 2022-04-21 DIAGNOSIS — E875 Hyperkalemia: Secondary | ICD-10-CM | POA: Diagnosis not present

## 2022-04-21 DIAGNOSIS — N186 End stage renal disease: Secondary | ICD-10-CM | POA: Diagnosis not present

## 2022-04-21 DIAGNOSIS — E11610 Type 2 diabetes mellitus with diabetic neuropathic arthropathy: Secondary | ICD-10-CM | POA: Diagnosis not present

## 2022-04-21 DIAGNOSIS — I701 Atherosclerosis of renal artery: Secondary | ICD-10-CM | POA: Diagnosis not present

## 2022-04-21 DIAGNOSIS — Z905 Acquired absence of kidney: Secondary | ICD-10-CM | POA: Diagnosis not present

## 2022-04-21 DIAGNOSIS — N261 Atrophy of kidney (terminal): Secondary | ICD-10-CM | POA: Diagnosis not present

## 2022-04-21 DIAGNOSIS — R000 Tachycardia, unspecified: Secondary | ICD-10-CM | POA: Diagnosis not present

## 2022-04-21 DIAGNOSIS — Z01818 Encounter for other preprocedural examination: Secondary | ICD-10-CM | POA: Diagnosis not present

## 2022-04-21 DIAGNOSIS — Z4822 Encounter for aftercare following kidney transplant: Secondary | ICD-10-CM | POA: Diagnosis not present

## 2022-04-21 DIAGNOSIS — Z992 Dependence on renal dialysis: Secondary | ICD-10-CM | POA: Diagnosis not present

## 2022-04-21 DIAGNOSIS — E871 Hypo-osmolality and hyponatremia: Secondary | ICD-10-CM | POA: Diagnosis not present

## 2022-04-21 DIAGNOSIS — I2109 ST elevation (STEMI) myocardial infarction involving other coronary artery of anterior wall: Secondary | ICD-10-CM | POA: Diagnosis not present

## 2022-04-21 DIAGNOSIS — E785 Hyperlipidemia, unspecified: Secondary | ICD-10-CM | POA: Diagnosis not present

## 2022-04-21 DIAGNOSIS — G4733 Obstructive sleep apnea (adult) (pediatric): Secondary | ICD-10-CM | POA: Diagnosis not present

## 2022-04-21 DIAGNOSIS — Z87891 Personal history of nicotine dependence: Secondary | ICD-10-CM | POA: Diagnosis not present

## 2022-04-21 DIAGNOSIS — T8619 Other complication of kidney transplant: Secondary | ICD-10-CM | POA: Diagnosis not present

## 2022-04-21 DIAGNOSIS — Z940 Kidney transplant status: Secondary | ICD-10-CM | POA: Diagnosis not present

## 2022-04-21 DIAGNOSIS — R140 Abdominal distension (gaseous): Secondary | ICD-10-CM | POA: Diagnosis not present

## 2022-04-21 DIAGNOSIS — M109 Gout, unspecified: Secondary | ICD-10-CM | POA: Diagnosis not present

## 2022-04-29 DIAGNOSIS — I10 Essential (primary) hypertension: Secondary | ICD-10-CM | POA: Diagnosis not present

## 2022-04-29 DIAGNOSIS — Z79899 Other long term (current) drug therapy: Secondary | ICD-10-CM | POA: Diagnosis not present

## 2022-04-29 DIAGNOSIS — Z87891 Personal history of nicotine dependence: Secondary | ICD-10-CM | POA: Diagnosis not present

## 2022-04-29 DIAGNOSIS — E11610 Type 2 diabetes mellitus with diabetic neuropathic arthropathy: Secondary | ICD-10-CM | POA: Diagnosis not present

## 2022-04-29 DIAGNOSIS — Z794 Long term (current) use of insulin: Secondary | ICD-10-CM | POA: Diagnosis not present

## 2022-04-29 DIAGNOSIS — G4733 Obstructive sleep apnea (adult) (pediatric): Secondary | ICD-10-CM | POA: Diagnosis not present

## 2022-04-29 DIAGNOSIS — R509 Fever, unspecified: Secondary | ICD-10-CM | POA: Diagnosis not present

## 2022-04-29 DIAGNOSIS — Z7952 Long term (current) use of systemic steroids: Secondary | ICD-10-CM | POA: Diagnosis not present

## 2022-04-29 DIAGNOSIS — K219 Gastro-esophageal reflux disease without esophagitis: Secondary | ICD-10-CM | POA: Diagnosis not present

## 2022-04-29 DIAGNOSIS — Z7982 Long term (current) use of aspirin: Secondary | ICD-10-CM | POA: Diagnosis not present

## 2022-04-29 DIAGNOSIS — Z888 Allergy status to other drugs, medicaments and biological substances status: Secondary | ICD-10-CM | POA: Diagnosis not present

## 2022-04-29 DIAGNOSIS — Z79621 Long term (current) use of calcineurin inhibitor: Secondary | ICD-10-CM | POA: Diagnosis not present

## 2022-04-29 DIAGNOSIS — Z940 Kidney transplant status: Secondary | ICD-10-CM | POA: Diagnosis not present

## 2022-04-29 DIAGNOSIS — R338 Other retention of urine: Secondary | ICD-10-CM | POA: Diagnosis not present

## 2022-04-29 DIAGNOSIS — Z880 Allergy status to penicillin: Secondary | ICD-10-CM | POA: Diagnosis not present

## 2022-04-29 DIAGNOSIS — E785 Hyperlipidemia, unspecified: Secondary | ICD-10-CM | POA: Diagnosis not present

## 2022-04-29 DIAGNOSIS — M109 Gout, unspecified: Secondary | ICD-10-CM | POA: Diagnosis not present

## 2022-04-29 DIAGNOSIS — Z91041 Radiographic dye allergy status: Secondary | ICD-10-CM | POA: Diagnosis not present

## 2022-04-29 DIAGNOSIS — I499 Cardiac arrhythmia, unspecified: Secondary | ICD-10-CM | POA: Diagnosis not present

## 2022-04-29 DIAGNOSIS — D6489 Other specified anemias: Secondary | ICD-10-CM | POA: Diagnosis not present

## 2022-04-30 DIAGNOSIS — I499 Cardiac arrhythmia, unspecified: Secondary | ICD-10-CM | POA: Diagnosis not present

## 2022-05-03 DIAGNOSIS — Z4822 Encounter for aftercare following kidney transplant: Secondary | ICD-10-CM | POA: Diagnosis not present

## 2022-05-03 DIAGNOSIS — Z79899 Other long term (current) drug therapy: Secondary | ICD-10-CM | POA: Diagnosis not present

## 2022-05-03 DIAGNOSIS — Z794 Long term (current) use of insulin: Secondary | ICD-10-CM | POA: Diagnosis not present

## 2022-05-03 DIAGNOSIS — E119 Type 2 diabetes mellitus without complications: Secondary | ICD-10-CM | POA: Diagnosis not present

## 2022-05-03 DIAGNOSIS — Z7952 Long term (current) use of systemic steroids: Secondary | ICD-10-CM | POA: Diagnosis not present

## 2022-05-03 DIAGNOSIS — Z79621 Long term (current) use of calcineurin inhibitor: Secondary | ICD-10-CM | POA: Diagnosis not present

## 2022-05-03 DIAGNOSIS — E785 Hyperlipidemia, unspecified: Secondary | ICD-10-CM | POA: Diagnosis not present

## 2022-05-03 DIAGNOSIS — I10 Essential (primary) hypertension: Secondary | ICD-10-CM | POA: Diagnosis not present

## 2022-05-03 DIAGNOSIS — D649 Anemia, unspecified: Secondary | ICD-10-CM | POA: Diagnosis not present

## 2022-05-03 DIAGNOSIS — Z940 Kidney transplant status: Secondary | ICD-10-CM | POA: Diagnosis not present

## 2022-05-03 DIAGNOSIS — D8989 Other specified disorders involving the immune mechanism, not elsewhere classified: Secondary | ICD-10-CM | POA: Diagnosis not present

## 2022-05-03 DIAGNOSIS — E875 Hyperkalemia: Secondary | ICD-10-CM | POA: Diagnosis not present

## 2022-05-06 DIAGNOSIS — Z4822 Encounter for aftercare following kidney transplant: Secondary | ICD-10-CM | POA: Diagnosis not present

## 2022-05-06 DIAGNOSIS — E119 Type 2 diabetes mellitus without complications: Secondary | ICD-10-CM | POA: Diagnosis not present

## 2022-05-06 DIAGNOSIS — I10 Essential (primary) hypertension: Secondary | ICD-10-CM | POA: Diagnosis not present

## 2022-05-06 DIAGNOSIS — Z940 Kidney transplant status: Secondary | ICD-10-CM | POA: Diagnosis not present

## 2022-05-06 DIAGNOSIS — E875 Hyperkalemia: Secondary | ICD-10-CM | POA: Diagnosis not present

## 2022-05-06 DIAGNOSIS — Z79811 Long term (current) use of aromatase inhibitors: Secondary | ICD-10-CM | POA: Diagnosis not present

## 2022-05-06 DIAGNOSIS — Z794 Long term (current) use of insulin: Secondary | ICD-10-CM | POA: Diagnosis not present

## 2022-05-06 DIAGNOSIS — Z79899 Other long term (current) drug therapy: Secondary | ICD-10-CM | POA: Diagnosis not present

## 2022-05-06 DIAGNOSIS — D849 Immunodeficiency, unspecified: Secondary | ICD-10-CM | POA: Diagnosis not present

## 2022-05-06 DIAGNOSIS — D649 Anemia, unspecified: Secondary | ICD-10-CM | POA: Diagnosis not present

## 2022-05-06 DIAGNOSIS — E785 Hyperlipidemia, unspecified: Secondary | ICD-10-CM | POA: Diagnosis not present

## 2022-05-06 DIAGNOSIS — E872 Acidosis, unspecified: Secondary | ICD-10-CM | POA: Diagnosis not present

## 2022-05-06 DIAGNOSIS — Z7952 Long term (current) use of systemic steroids: Secondary | ICD-10-CM | POA: Diagnosis not present

## 2022-05-06 DIAGNOSIS — Z7969 Long term (current) use of other immunomodulators and immunosuppressants: Secondary | ICD-10-CM | POA: Diagnosis not present

## 2022-05-06 DIAGNOSIS — Z79621 Long term (current) use of calcineurin inhibitor: Secondary | ICD-10-CM | POA: Diagnosis not present

## 2022-05-06 DIAGNOSIS — Z792 Long term (current) use of antibiotics: Secondary | ICD-10-CM | POA: Diagnosis not present

## 2022-05-10 DIAGNOSIS — E1165 Type 2 diabetes mellitus with hyperglycemia: Secondary | ICD-10-CM | POA: Diagnosis not present

## 2022-05-10 DIAGNOSIS — Z7952 Long term (current) use of systemic steroids: Secondary | ICD-10-CM | POA: Diagnosis not present

## 2022-05-10 DIAGNOSIS — Z5181 Encounter for therapeutic drug level monitoring: Secondary | ICD-10-CM | POA: Diagnosis not present

## 2022-05-10 DIAGNOSIS — E875 Hyperkalemia: Secondary | ICD-10-CM | POA: Diagnosis not present

## 2022-05-10 DIAGNOSIS — Z794 Long term (current) use of insulin: Secondary | ICD-10-CM | POA: Diagnosis not present

## 2022-05-10 DIAGNOSIS — Z4822 Encounter for aftercare following kidney transplant: Secondary | ICD-10-CM | POA: Diagnosis not present

## 2022-05-10 DIAGNOSIS — E785 Hyperlipidemia, unspecified: Secondary | ICD-10-CM | POA: Diagnosis not present

## 2022-05-10 DIAGNOSIS — G4733 Obstructive sleep apnea (adult) (pediatric): Secondary | ICD-10-CM | POA: Diagnosis not present

## 2022-05-10 DIAGNOSIS — D84821 Immunodeficiency due to drugs: Secondary | ICD-10-CM | POA: Diagnosis not present

## 2022-05-10 DIAGNOSIS — K219 Gastro-esophageal reflux disease without esophagitis: Secondary | ICD-10-CM | POA: Diagnosis not present

## 2022-05-10 DIAGNOSIS — I10 Essential (primary) hypertension: Secondary | ICD-10-CM | POA: Diagnosis not present

## 2022-05-10 DIAGNOSIS — Z79621 Long term (current) use of calcineurin inhibitor: Secondary | ICD-10-CM | POA: Diagnosis not present

## 2022-05-10 DIAGNOSIS — D649 Anemia, unspecified: Secondary | ICD-10-CM | POA: Diagnosis not present

## 2022-05-10 DIAGNOSIS — Z79899 Other long term (current) drug therapy: Secondary | ICD-10-CM | POA: Diagnosis not present

## 2022-05-13 DIAGNOSIS — E119 Type 2 diabetes mellitus without complications: Secondary | ICD-10-CM | POA: Diagnosis not present

## 2022-05-13 DIAGNOSIS — Z79621 Long term (current) use of calcineurin inhibitor: Secondary | ICD-10-CM | POA: Diagnosis not present

## 2022-05-13 DIAGNOSIS — N2581 Secondary hyperparathyroidism of renal origin: Secondary | ICD-10-CM | POA: Diagnosis not present

## 2022-05-13 DIAGNOSIS — Z940 Kidney transplant status: Secondary | ICD-10-CM | POA: Diagnosis not present

## 2022-05-13 DIAGNOSIS — G4733 Obstructive sleep apnea (adult) (pediatric): Secondary | ICD-10-CM | POA: Diagnosis not present

## 2022-05-13 DIAGNOSIS — D649 Anemia, unspecified: Secondary | ICD-10-CM | POA: Diagnosis not present

## 2022-05-13 DIAGNOSIS — Z794 Long term (current) use of insulin: Secondary | ICD-10-CM | POA: Diagnosis not present

## 2022-05-13 DIAGNOSIS — Z960 Presence of urogenital implants: Secondary | ICD-10-CM | POA: Diagnosis not present

## 2022-05-13 DIAGNOSIS — I10 Essential (primary) hypertension: Secondary | ICD-10-CM | POA: Diagnosis not present

## 2022-05-13 DIAGNOSIS — Z4822 Encounter for aftercare following kidney transplant: Secondary | ICD-10-CM | POA: Diagnosis not present

## 2022-05-13 DIAGNOSIS — D8989 Other specified disorders involving the immune mechanism, not elsewhere classified: Secondary | ICD-10-CM | POA: Diagnosis not present

## 2022-05-13 DIAGNOSIS — Z7952 Long term (current) use of systemic steroids: Secondary | ICD-10-CM | POA: Diagnosis not present

## 2022-05-13 DIAGNOSIS — Z862 Personal history of diseases of the blood and blood-forming organs and certain disorders involving the immune mechanism: Secondary | ICD-10-CM | POA: Diagnosis not present

## 2022-05-13 DIAGNOSIS — K219 Gastro-esophageal reflux disease without esophagitis: Secondary | ICD-10-CM | POA: Diagnosis not present

## 2022-05-13 DIAGNOSIS — Z79624 Long term (current) use of inhibitors of nucleotide synthesis: Secondary | ICD-10-CM | POA: Diagnosis not present

## 2022-05-13 DIAGNOSIS — E875 Hyperkalemia: Secondary | ICD-10-CM | POA: Diagnosis not present

## 2022-05-13 DIAGNOSIS — E785 Hyperlipidemia, unspecified: Secondary | ICD-10-CM | POA: Diagnosis not present

## 2022-05-13 DIAGNOSIS — Z79899 Other long term (current) drug therapy: Secondary | ICD-10-CM | POA: Diagnosis not present

## 2022-05-13 DIAGNOSIS — Z87891 Personal history of nicotine dependence: Secondary | ICD-10-CM | POA: Diagnosis not present

## 2022-05-17 DIAGNOSIS — I10 Essential (primary) hypertension: Secondary | ICD-10-CM | POA: Diagnosis not present

## 2022-05-17 DIAGNOSIS — Z4822 Encounter for aftercare following kidney transplant: Secondary | ICD-10-CM | POA: Diagnosis not present

## 2022-05-17 DIAGNOSIS — D649 Anemia, unspecified: Secondary | ICD-10-CM | POA: Diagnosis not present

## 2022-05-17 DIAGNOSIS — Z992 Dependence on renal dialysis: Secondary | ICD-10-CM | POA: Diagnosis not present

## 2022-05-17 DIAGNOSIS — E872 Acidosis, unspecified: Secondary | ICD-10-CM | POA: Diagnosis not present

## 2022-05-17 DIAGNOSIS — Z79621 Long term (current) use of calcineurin inhibitor: Secondary | ICD-10-CM | POA: Diagnosis not present

## 2022-05-17 DIAGNOSIS — Z794 Long term (current) use of insulin: Secondary | ICD-10-CM | POA: Diagnosis not present

## 2022-05-17 DIAGNOSIS — E119 Type 2 diabetes mellitus without complications: Secondary | ICD-10-CM | POA: Diagnosis not present

## 2022-05-17 DIAGNOSIS — E785 Hyperlipidemia, unspecified: Secondary | ICD-10-CM | POA: Diagnosis not present

## 2022-05-17 DIAGNOSIS — Z792 Long term (current) use of antibiotics: Secondary | ICD-10-CM | POA: Diagnosis not present

## 2022-05-17 DIAGNOSIS — N2581 Secondary hyperparathyroidism of renal origin: Secondary | ICD-10-CM | POA: Diagnosis not present

## 2022-05-17 DIAGNOSIS — E875 Hyperkalemia: Secondary | ICD-10-CM | POA: Diagnosis not present

## 2022-05-20 DIAGNOSIS — I10 Essential (primary) hypertension: Secondary | ICD-10-CM | POA: Diagnosis not present

## 2022-05-20 DIAGNOSIS — E119 Type 2 diabetes mellitus without complications: Secondary | ICD-10-CM | POA: Diagnosis not present

## 2022-05-20 DIAGNOSIS — R791 Abnormal coagulation profile: Secondary | ICD-10-CM | POA: Diagnosis not present

## 2022-05-20 DIAGNOSIS — D649 Anemia, unspecified: Secondary | ICD-10-CM | POA: Diagnosis not present

## 2022-05-20 DIAGNOSIS — Z940 Kidney transplant status: Secondary | ICD-10-CM | POA: Diagnosis not present

## 2022-05-20 DIAGNOSIS — Z4822 Encounter for aftercare following kidney transplant: Secondary | ICD-10-CM | POA: Diagnosis not present

## 2022-05-20 DIAGNOSIS — Z79899 Other long term (current) drug therapy: Secondary | ICD-10-CM | POA: Diagnosis not present

## 2022-05-20 DIAGNOSIS — D8989 Other specified disorders involving the immune mechanism, not elsewhere classified: Secondary | ICD-10-CM | POA: Diagnosis not present

## 2022-05-20 DIAGNOSIS — Z7952 Long term (current) use of systemic steroids: Secondary | ICD-10-CM | POA: Diagnosis not present

## 2022-05-27 DIAGNOSIS — Z978 Presence of other specified devices: Secondary | ICD-10-CM | POA: Diagnosis not present

## 2022-05-27 DIAGNOSIS — I10 Essential (primary) hypertension: Secondary | ICD-10-CM | POA: Diagnosis not present

## 2022-05-27 DIAGNOSIS — E785 Hyperlipidemia, unspecified: Secondary | ICD-10-CM | POA: Diagnosis not present

## 2022-05-27 DIAGNOSIS — Z79811 Long term (current) use of aromatase inhibitors: Secondary | ICD-10-CM | POA: Diagnosis not present

## 2022-05-27 DIAGNOSIS — Z794 Long term (current) use of insulin: Secondary | ICD-10-CM | POA: Diagnosis not present

## 2022-05-27 DIAGNOSIS — Z4822 Encounter for aftercare following kidney transplant: Secondary | ICD-10-CM | POA: Diagnosis not present

## 2022-05-27 DIAGNOSIS — Z940 Kidney transplant status: Secondary | ICD-10-CM | POA: Diagnosis not present

## 2022-05-27 DIAGNOSIS — E875 Hyperkalemia: Secondary | ICD-10-CM | POA: Diagnosis not present

## 2022-05-27 DIAGNOSIS — Z79899 Other long term (current) drug therapy: Secondary | ICD-10-CM | POA: Diagnosis not present

## 2022-05-27 DIAGNOSIS — D649 Anemia, unspecified: Secondary | ICD-10-CM | POA: Diagnosis not present

## 2022-05-27 DIAGNOSIS — Z79621 Long term (current) use of calcineurin inhibitor: Secondary | ICD-10-CM | POA: Diagnosis not present

## 2022-05-27 DIAGNOSIS — Z7952 Long term (current) use of systemic steroids: Secondary | ICD-10-CM | POA: Diagnosis not present

## 2022-05-27 DIAGNOSIS — D849 Immunodeficiency, unspecified: Secondary | ICD-10-CM | POA: Diagnosis not present

## 2022-06-03 DIAGNOSIS — Z79621 Long term (current) use of calcineurin inhibitor: Secondary | ICD-10-CM | POA: Diagnosis not present

## 2022-06-03 DIAGNOSIS — Z4822 Encounter for aftercare following kidney transplant: Secondary | ICD-10-CM | POA: Diagnosis not present

## 2022-06-03 DIAGNOSIS — Z7969 Long term (current) use of other immunomodulators and immunosuppressants: Secondary | ICD-10-CM | POA: Diagnosis not present

## 2022-06-03 DIAGNOSIS — E872 Acidosis, unspecified: Secondary | ICD-10-CM | POA: Diagnosis not present

## 2022-06-03 DIAGNOSIS — Z79899 Other long term (current) drug therapy: Secondary | ICD-10-CM | POA: Diagnosis not present

## 2022-06-03 DIAGNOSIS — R8281 Pyuria: Secondary | ICD-10-CM | POA: Diagnosis not present

## 2022-06-03 DIAGNOSIS — Z7952 Long term (current) use of systemic steroids: Secondary | ICD-10-CM | POA: Diagnosis not present

## 2022-06-03 DIAGNOSIS — G4733 Obstructive sleep apnea (adult) (pediatric): Secondary | ICD-10-CM | POA: Diagnosis not present

## 2022-06-03 DIAGNOSIS — E875 Hyperkalemia: Secondary | ICD-10-CM | POA: Diagnosis not present

## 2022-06-03 DIAGNOSIS — Z940 Kidney transplant status: Secondary | ICD-10-CM | POA: Diagnosis not present

## 2022-06-03 DIAGNOSIS — E119 Type 2 diabetes mellitus without complications: Secondary | ICD-10-CM | POA: Diagnosis not present

## 2022-06-03 DIAGNOSIS — Z792 Long term (current) use of antibiotics: Secondary | ICD-10-CM | POA: Diagnosis not present

## 2022-06-03 DIAGNOSIS — E785 Hyperlipidemia, unspecified: Secondary | ICD-10-CM | POA: Diagnosis not present

## 2022-06-03 DIAGNOSIS — I10 Essential (primary) hypertension: Secondary | ICD-10-CM | POA: Diagnosis not present

## 2022-06-03 DIAGNOSIS — D649 Anemia, unspecified: Secondary | ICD-10-CM | POA: Diagnosis not present

## 2022-06-03 DIAGNOSIS — Z794 Long term (current) use of insulin: Secondary | ICD-10-CM | POA: Diagnosis not present

## 2022-06-03 DIAGNOSIS — E1121 Type 2 diabetes mellitus with diabetic nephropathy: Secondary | ICD-10-CM | POA: Diagnosis not present

## 2022-06-04 DIAGNOSIS — Z940 Kidney transplant status: Secondary | ICD-10-CM | POA: Diagnosis not present

## 2022-06-04 DIAGNOSIS — Z79899 Other long term (current) drug therapy: Secondary | ICD-10-CM | POA: Diagnosis not present

## 2022-06-10 DIAGNOSIS — N322 Vesical fistula, not elsewhere classified: Secondary | ICD-10-CM | POA: Diagnosis not present

## 2022-06-10 DIAGNOSIS — E785 Hyperlipidemia, unspecified: Secondary | ICD-10-CM | POA: Diagnosis not present

## 2022-06-10 DIAGNOSIS — R8290 Unspecified abnormal findings in urine: Secondary | ICD-10-CM | POA: Diagnosis not present

## 2022-06-10 DIAGNOSIS — Z789 Other specified health status: Secondary | ICD-10-CM | POA: Diagnosis not present

## 2022-06-10 DIAGNOSIS — T8619 Other complication of kidney transplant: Secondary | ICD-10-CM | POA: Diagnosis not present

## 2022-06-10 DIAGNOSIS — Z794 Long term (current) use of insulin: Secondary | ICD-10-CM | POA: Diagnosis not present

## 2022-06-10 DIAGNOSIS — G4733 Obstructive sleep apnea (adult) (pediatric): Secondary | ICD-10-CM | POA: Diagnosis not present

## 2022-06-10 DIAGNOSIS — M109 Gout, unspecified: Secondary | ICD-10-CM | POA: Diagnosis not present

## 2022-06-10 DIAGNOSIS — I10 Essential (primary) hypertension: Secondary | ICD-10-CM | POA: Diagnosis not present

## 2022-06-10 DIAGNOSIS — R188 Other ascites: Secondary | ICD-10-CM | POA: Diagnosis not present

## 2022-06-10 DIAGNOSIS — E11610 Type 2 diabetes mellitus with diabetic neuropathic arthropathy: Secondary | ICD-10-CM | POA: Diagnosis not present

## 2022-06-10 DIAGNOSIS — Z960 Presence of urogenital implants: Secondary | ICD-10-CM | POA: Diagnosis not present

## 2022-06-10 DIAGNOSIS — Z940 Kidney transplant status: Secondary | ICD-10-CM | POA: Diagnosis not present

## 2022-06-10 DIAGNOSIS — Z87891 Personal history of nicotine dependence: Secondary | ICD-10-CM | POA: Diagnosis not present

## 2022-06-10 DIAGNOSIS — Z7952 Long term (current) use of systemic steroids: Secondary | ICD-10-CM | POA: Diagnosis not present

## 2022-06-10 DIAGNOSIS — Z91041 Radiographic dye allergy status: Secondary | ICD-10-CM | POA: Diagnosis not present

## 2022-06-10 DIAGNOSIS — K651 Peritoneal abscess: Secondary | ICD-10-CM | POA: Diagnosis not present

## 2022-06-10 DIAGNOSIS — E875 Hyperkalemia: Secondary | ICD-10-CM | POA: Diagnosis not present

## 2022-06-10 DIAGNOSIS — N3289 Other specified disorders of bladder: Secondary | ICD-10-CM | POA: Diagnosis not present

## 2022-06-10 DIAGNOSIS — Z79899 Other long term (current) drug therapy: Secondary | ICD-10-CM | POA: Diagnosis not present

## 2022-06-10 DIAGNOSIS — E119 Type 2 diabetes mellitus without complications: Secondary | ICD-10-CM | POA: Diagnosis not present

## 2022-06-10 DIAGNOSIS — L02211 Cutaneous abscess of abdominal wall: Secondary | ICD-10-CM | POA: Diagnosis not present

## 2022-06-10 DIAGNOSIS — D849 Immunodeficiency, unspecified: Secondary | ICD-10-CM | POA: Diagnosis not present

## 2022-06-10 DIAGNOSIS — Z4822 Encounter for aftercare following kidney transplant: Secondary | ICD-10-CM | POA: Diagnosis not present

## 2022-06-10 DIAGNOSIS — K6811 Postprocedural retroperitoneal abscess: Secondary | ICD-10-CM | POA: Diagnosis not present

## 2022-06-10 DIAGNOSIS — K219 Gastro-esophageal reflux disease without esophagitis: Secondary | ICD-10-CM | POA: Diagnosis not present

## 2022-06-11 DIAGNOSIS — Z960 Presence of urogenital implants: Secondary | ICD-10-CM | POA: Diagnosis not present

## 2022-06-11 DIAGNOSIS — R8290 Unspecified abnormal findings in urine: Secondary | ICD-10-CM | POA: Diagnosis not present

## 2022-06-11 DIAGNOSIS — Z7952 Long term (current) use of systemic steroids: Secondary | ICD-10-CM | POA: Diagnosis not present

## 2022-06-11 DIAGNOSIS — Z79899 Other long term (current) drug therapy: Secondary | ICD-10-CM | POA: Diagnosis not present

## 2022-06-11 DIAGNOSIS — R188 Other ascites: Secondary | ICD-10-CM | POA: Diagnosis not present

## 2022-06-11 DIAGNOSIS — L02211 Cutaneous abscess of abdominal wall: Secondary | ICD-10-CM | POA: Diagnosis not present

## 2022-06-11 DIAGNOSIS — E119 Type 2 diabetes mellitus without complications: Secondary | ICD-10-CM | POA: Diagnosis not present

## 2022-06-11 DIAGNOSIS — Z4822 Encounter for aftercare following kidney transplant: Secondary | ICD-10-CM | POA: Diagnosis not present

## 2022-06-12 DIAGNOSIS — Z79899 Other long term (current) drug therapy: Secondary | ICD-10-CM | POA: Diagnosis not present

## 2022-06-12 DIAGNOSIS — E119 Type 2 diabetes mellitus without complications: Secondary | ICD-10-CM | POA: Diagnosis not present

## 2022-06-12 DIAGNOSIS — Z7952 Long term (current) use of systemic steroids: Secondary | ICD-10-CM | POA: Diagnosis not present

## 2022-06-12 DIAGNOSIS — L02211 Cutaneous abscess of abdominal wall: Secondary | ICD-10-CM | POA: Diagnosis not present

## 2022-06-12 DIAGNOSIS — K651 Peritoneal abscess: Secondary | ICD-10-CM | POA: Diagnosis not present

## 2022-06-12 DIAGNOSIS — Z4822 Encounter for aftercare following kidney transplant: Secondary | ICD-10-CM | POA: Diagnosis not present

## 2022-06-13 DIAGNOSIS — L02211 Cutaneous abscess of abdominal wall: Secondary | ICD-10-CM | POA: Diagnosis not present

## 2022-06-13 DIAGNOSIS — E119 Type 2 diabetes mellitus without complications: Secondary | ICD-10-CM | POA: Diagnosis not present

## 2022-06-13 DIAGNOSIS — Z7952 Long term (current) use of systemic steroids: Secondary | ICD-10-CM | POA: Diagnosis not present

## 2022-06-13 DIAGNOSIS — Z79899 Other long term (current) drug therapy: Secondary | ICD-10-CM | POA: Diagnosis not present

## 2022-06-13 DIAGNOSIS — Z4822 Encounter for aftercare following kidney transplant: Secondary | ICD-10-CM | POA: Diagnosis not present

## 2022-06-14 DIAGNOSIS — E119 Type 2 diabetes mellitus without complications: Secondary | ICD-10-CM | POA: Diagnosis not present

## 2022-06-14 DIAGNOSIS — L02211 Cutaneous abscess of abdominal wall: Secondary | ICD-10-CM | POA: Diagnosis not present

## 2022-06-14 DIAGNOSIS — Z79899 Other long term (current) drug therapy: Secondary | ICD-10-CM | POA: Diagnosis not present

## 2022-06-14 DIAGNOSIS — Z940 Kidney transplant status: Secondary | ICD-10-CM | POA: Diagnosis not present

## 2022-06-15 DIAGNOSIS — Z940 Kidney transplant status: Secondary | ICD-10-CM | POA: Diagnosis not present

## 2022-06-15 DIAGNOSIS — Z4822 Encounter for aftercare following kidney transplant: Secondary | ICD-10-CM | POA: Diagnosis not present

## 2022-06-15 DIAGNOSIS — Z789 Other specified health status: Secondary | ICD-10-CM | POA: Diagnosis not present

## 2022-06-15 DIAGNOSIS — T8619 Other complication of kidney transplant: Secondary | ICD-10-CM | POA: Diagnosis not present

## 2022-06-15 DIAGNOSIS — L02211 Cutaneous abscess of abdominal wall: Secondary | ICD-10-CM | POA: Diagnosis not present

## 2022-06-15 DIAGNOSIS — Z7952 Long term (current) use of systemic steroids: Secondary | ICD-10-CM | POA: Diagnosis not present

## 2022-06-15 DIAGNOSIS — E119 Type 2 diabetes mellitus without complications: Secondary | ICD-10-CM | POA: Diagnosis not present

## 2022-06-15 DIAGNOSIS — Z79899 Other long term (current) drug therapy: Secondary | ICD-10-CM | POA: Diagnosis not present

## 2022-06-16 DIAGNOSIS — L02211 Cutaneous abscess of abdominal wall: Secondary | ICD-10-CM | POA: Diagnosis not present

## 2022-06-17 DIAGNOSIS — L02211 Cutaneous abscess of abdominal wall: Secondary | ICD-10-CM | POA: Diagnosis not present

## 2022-06-21 DIAGNOSIS — E119 Type 2 diabetes mellitus without complications: Secondary | ICD-10-CM | POA: Diagnosis not present

## 2022-06-21 DIAGNOSIS — Z5181 Encounter for therapeutic drug level monitoring: Secondary | ICD-10-CM | POA: Diagnosis not present

## 2022-06-21 DIAGNOSIS — Z4822 Encounter for aftercare following kidney transplant: Secondary | ICD-10-CM | POA: Diagnosis not present

## 2022-06-21 DIAGNOSIS — Z794 Long term (current) use of insulin: Secondary | ICD-10-CM | POA: Diagnosis not present

## 2022-06-21 DIAGNOSIS — Z79624 Long term (current) use of inhibitors of nucleotide synthesis: Secondary | ICD-10-CM | POA: Diagnosis not present

## 2022-06-21 DIAGNOSIS — I10 Essential (primary) hypertension: Secondary | ICD-10-CM | POA: Diagnosis not present

## 2022-06-21 DIAGNOSIS — Z79899 Other long term (current) drug therapy: Secondary | ICD-10-CM | POA: Diagnosis not present

## 2022-06-21 DIAGNOSIS — I151 Hypertension secondary to other renal disorders: Secondary | ICD-10-CM | POA: Diagnosis not present

## 2022-06-21 DIAGNOSIS — E1121 Type 2 diabetes mellitus with diabetic nephropathy: Secondary | ICD-10-CM | POA: Diagnosis not present

## 2022-06-21 DIAGNOSIS — Z79621 Long term (current) use of calcineurin inhibitor: Secondary | ICD-10-CM | POA: Diagnosis not present

## 2022-06-21 DIAGNOSIS — Z7952 Long term (current) use of systemic steroids: Secondary | ICD-10-CM | POA: Diagnosis not present

## 2022-06-21 DIAGNOSIS — D649 Anemia, unspecified: Secondary | ICD-10-CM | POA: Diagnosis not present

## 2022-06-21 DIAGNOSIS — Z940 Kidney transplant status: Secondary | ICD-10-CM | POA: Diagnosis not present

## 2022-06-21 DIAGNOSIS — Z8619 Personal history of other infectious and parasitic diseases: Secondary | ICD-10-CM | POA: Diagnosis not present

## 2022-06-21 DIAGNOSIS — Z792 Long term (current) use of antibiotics: Secondary | ICD-10-CM | POA: Diagnosis not present

## 2022-06-23 DIAGNOSIS — L02211 Cutaneous abscess of abdominal wall: Secondary | ICD-10-CM | POA: Diagnosis not present

## 2022-06-24 DIAGNOSIS — L02211 Cutaneous abscess of abdominal wall: Secondary | ICD-10-CM | POA: Diagnosis not present

## 2022-07-06 DIAGNOSIS — Z940 Kidney transplant status: Secondary | ICD-10-CM | POA: Diagnosis not present

## 2022-07-06 DIAGNOSIS — I151 Hypertension secondary to other renal disorders: Secondary | ICD-10-CM | POA: Diagnosis not present

## 2022-07-06 DIAGNOSIS — Z4822 Encounter for aftercare following kidney transplant: Secondary | ICD-10-CM | POA: Diagnosis not present

## 2022-07-06 DIAGNOSIS — Z5181 Encounter for therapeutic drug level monitoring: Secondary | ICD-10-CM | POA: Diagnosis not present

## 2022-07-06 DIAGNOSIS — D849 Immunodeficiency, unspecified: Secondary | ICD-10-CM | POA: Diagnosis not present

## 2022-07-06 DIAGNOSIS — Z79899 Other long term (current) drug therapy: Secondary | ICD-10-CM | POA: Diagnosis not present

## 2022-07-12 DIAGNOSIS — L02211 Cutaneous abscess of abdominal wall: Secondary | ICD-10-CM | POA: Diagnosis not present

## 2022-07-13 DIAGNOSIS — N2581 Secondary hyperparathyroidism of renal origin: Secondary | ICD-10-CM | POA: Diagnosis not present

## 2022-07-13 DIAGNOSIS — Z4822 Encounter for aftercare following kidney transplant: Secondary | ICD-10-CM | POA: Diagnosis not present

## 2022-07-13 DIAGNOSIS — D849 Immunodeficiency, unspecified: Secondary | ICD-10-CM | POA: Diagnosis not present

## 2022-07-13 DIAGNOSIS — Z792 Long term (current) use of antibiotics: Secondary | ICD-10-CM | POA: Diagnosis not present

## 2022-07-13 DIAGNOSIS — D649 Anemia, unspecified: Secondary | ICD-10-CM | POA: Diagnosis not present

## 2022-07-13 DIAGNOSIS — Z79899 Other long term (current) drug therapy: Secondary | ICD-10-CM | POA: Diagnosis not present

## 2022-07-13 DIAGNOSIS — E875 Hyperkalemia: Secondary | ICD-10-CM | POA: Diagnosis not present

## 2022-07-13 DIAGNOSIS — Z8639 Personal history of other endocrine, nutritional and metabolic disease: Secondary | ICD-10-CM | POA: Diagnosis not present

## 2022-07-13 DIAGNOSIS — Z79621 Long term (current) use of calcineurin inhibitor: Secondary | ICD-10-CM | POA: Diagnosis not present

## 2022-07-13 DIAGNOSIS — T86898 Other complications of other transplanted tissue: Secondary | ICD-10-CM | POA: Diagnosis not present

## 2022-07-13 DIAGNOSIS — D72819 Decreased white blood cell count, unspecified: Secondary | ICD-10-CM | POA: Diagnosis not present

## 2022-07-13 DIAGNOSIS — E785 Hyperlipidemia, unspecified: Secondary | ICD-10-CM | POA: Diagnosis not present

## 2022-07-13 DIAGNOSIS — E1121 Type 2 diabetes mellitus with diabetic nephropathy: Secondary | ICD-10-CM | POA: Diagnosis not present

## 2022-07-13 DIAGNOSIS — Z79624 Long term (current) use of inhibitors of nucleotide synthesis: Secondary | ICD-10-CM | POA: Diagnosis not present

## 2022-07-13 DIAGNOSIS — I10 Essential (primary) hypertension: Secondary | ICD-10-CM | POA: Diagnosis not present

## 2022-07-13 DIAGNOSIS — Z7952 Long term (current) use of systemic steroids: Secondary | ICD-10-CM | POA: Diagnosis not present

## 2022-07-13 DIAGNOSIS — E119 Type 2 diabetes mellitus without complications: Secondary | ICD-10-CM | POA: Diagnosis not present

## 2022-07-13 DIAGNOSIS — Z794 Long term (current) use of insulin: Secondary | ICD-10-CM | POA: Diagnosis not present

## 2022-07-22 DIAGNOSIS — L02211 Cutaneous abscess of abdominal wall: Secondary | ICD-10-CM | POA: Diagnosis not present

## 2022-07-26 DIAGNOSIS — Z794 Long term (current) use of insulin: Secondary | ICD-10-CM | POA: Diagnosis not present

## 2022-07-26 DIAGNOSIS — E785 Hyperlipidemia, unspecified: Secondary | ICD-10-CM | POA: Diagnosis not present

## 2022-07-26 DIAGNOSIS — Z5181 Encounter for therapeutic drug level monitoring: Secondary | ICD-10-CM | POA: Diagnosis not present

## 2022-07-26 DIAGNOSIS — N2581 Secondary hyperparathyroidism of renal origin: Secondary | ICD-10-CM | POA: Diagnosis not present

## 2022-07-26 DIAGNOSIS — Z79899 Other long term (current) drug therapy: Secondary | ICD-10-CM | POA: Diagnosis not present

## 2022-07-26 DIAGNOSIS — Z7952 Long term (current) use of systemic steroids: Secondary | ICD-10-CM | POA: Diagnosis not present

## 2022-07-26 DIAGNOSIS — E119 Type 2 diabetes mellitus without complications: Secondary | ICD-10-CM | POA: Diagnosis not present

## 2022-07-26 DIAGNOSIS — E875 Hyperkalemia: Secondary | ICD-10-CM | POA: Diagnosis not present

## 2022-07-26 DIAGNOSIS — I10 Essential (primary) hypertension: Secondary | ICD-10-CM | POA: Diagnosis not present

## 2022-07-26 DIAGNOSIS — Z4822 Encounter for aftercare following kidney transplant: Secondary | ICD-10-CM | POA: Diagnosis not present

## 2022-07-26 DIAGNOSIS — Z79624 Long term (current) use of inhibitors of nucleotide synthesis: Secondary | ICD-10-CM | POA: Diagnosis not present

## 2022-07-26 DIAGNOSIS — D649 Anemia, unspecified: Secondary | ICD-10-CM | POA: Diagnosis not present

## 2022-07-26 DIAGNOSIS — Z79621 Long term (current) use of calcineurin inhibitor: Secondary | ICD-10-CM | POA: Diagnosis not present

## 2022-07-26 DIAGNOSIS — D72819 Decreased white blood cell count, unspecified: Secondary | ICD-10-CM | POA: Diagnosis not present

## 2022-08-23 DIAGNOSIS — N319 Neuromuscular dysfunction of bladder, unspecified: Secondary | ICD-10-CM | POA: Diagnosis not present

## 2022-08-23 DIAGNOSIS — Z940 Kidney transplant status: Secondary | ICD-10-CM | POA: Diagnosis not present

## 2022-08-26 DIAGNOSIS — N35011 Post-traumatic bulbous urethral stricture: Secondary | ICD-10-CM | POA: Diagnosis not present

## 2022-08-26 DIAGNOSIS — Z936 Other artificial openings of urinary tract status: Secondary | ICD-10-CM | POA: Diagnosis not present

## 2022-08-26 DIAGNOSIS — N319 Neuromuscular dysfunction of bladder, unspecified: Secondary | ICD-10-CM | POA: Diagnosis not present

## 2022-09-14 DIAGNOSIS — Z79899 Other long term (current) drug therapy: Secondary | ICD-10-CM | POA: Diagnosis not present

## 2022-09-14 DIAGNOSIS — E875 Hyperkalemia: Secondary | ICD-10-CM | POA: Diagnosis not present

## 2022-09-14 DIAGNOSIS — E785 Hyperlipidemia, unspecified: Secondary | ICD-10-CM | POA: Diagnosis not present

## 2022-09-14 DIAGNOSIS — N139 Obstructive and reflux uropathy, unspecified: Secondary | ICD-10-CM | POA: Diagnosis not present

## 2022-09-14 DIAGNOSIS — Z940 Kidney transplant status: Secondary | ICD-10-CM | POA: Diagnosis not present

## 2022-09-14 DIAGNOSIS — E1121 Type 2 diabetes mellitus with diabetic nephropathy: Secondary | ICD-10-CM | POA: Diagnosis not present

## 2022-09-14 DIAGNOSIS — Z978 Presence of other specified devices: Secondary | ICD-10-CM | POA: Diagnosis not present

## 2022-09-14 DIAGNOSIS — I10 Essential (primary) hypertension: Secondary | ICD-10-CM | POA: Diagnosis not present

## 2022-09-14 DIAGNOSIS — Z5181 Encounter for therapeutic drug level monitoring: Secondary | ICD-10-CM | POA: Diagnosis not present

## 2022-09-14 DIAGNOSIS — D849 Immunodeficiency, unspecified: Secondary | ICD-10-CM | POA: Diagnosis not present

## 2022-09-14 DIAGNOSIS — Z79621 Long term (current) use of calcineurin inhibitor: Secondary | ICD-10-CM | POA: Diagnosis not present

## 2022-09-28 DIAGNOSIS — E785 Hyperlipidemia, unspecified: Secondary | ICD-10-CM | POA: Diagnosis not present

## 2022-09-28 DIAGNOSIS — G4733 Obstructive sleep apnea (adult) (pediatric): Secondary | ICD-10-CM | POA: Diagnosis not present

## 2022-09-28 DIAGNOSIS — Z79899 Other long term (current) drug therapy: Secondary | ICD-10-CM | POA: Diagnosis not present

## 2022-09-28 DIAGNOSIS — D509 Iron deficiency anemia, unspecified: Secondary | ICD-10-CM | POA: Diagnosis not present

## 2022-09-28 DIAGNOSIS — Z940 Kidney transplant status: Secondary | ICD-10-CM | POA: Diagnosis not present

## 2022-09-28 DIAGNOSIS — E119 Type 2 diabetes mellitus without complications: Secondary | ICD-10-CM | POA: Diagnosis not present

## 2022-09-28 DIAGNOSIS — Z794 Long term (current) use of insulin: Secondary | ICD-10-CM | POA: Diagnosis not present

## 2022-09-28 DIAGNOSIS — Z8619 Personal history of other infectious and parasitic diseases: Secondary | ICD-10-CM | POA: Diagnosis not present

## 2022-09-28 DIAGNOSIS — K219 Gastro-esophageal reflux disease without esophagitis: Secondary | ICD-10-CM | POA: Diagnosis not present

## 2022-09-28 DIAGNOSIS — N35011 Post-traumatic bulbous urethral stricture: Secondary | ICD-10-CM | POA: Diagnosis not present

## 2022-09-28 DIAGNOSIS — Z7985 Long-term (current) use of injectable non-insulin antidiabetic drugs: Secondary | ICD-10-CM | POA: Diagnosis not present

## 2022-09-28 DIAGNOSIS — I10 Essential (primary) hypertension: Secondary | ICD-10-CM | POA: Diagnosis not present

## 2022-11-26 DIAGNOSIS — Z940 Kidney transplant status: Secondary | ICD-10-CM | POA: Diagnosis not present

## 2022-12-27 DIAGNOSIS — Z940 Kidney transplant status: Secondary | ICD-10-CM | POA: Diagnosis not present

## 2023-01-06 DIAGNOSIS — I700 Atherosclerosis of aorta: Secondary | ICD-10-CM | POA: Diagnosis not present

## 2023-01-06 DIAGNOSIS — E1122 Type 2 diabetes mellitus with diabetic chronic kidney disease: Secondary | ICD-10-CM | POA: Diagnosis not present

## 2023-01-06 DIAGNOSIS — I130 Hypertensive heart and chronic kidney disease with heart failure and stage 1 through stage 4 chronic kidney disease, or unspecified chronic kidney disease: Secondary | ICD-10-CM | POA: Diagnosis not present

## 2023-01-06 DIAGNOSIS — N1832 Chronic kidney disease, stage 3b: Secondary | ICD-10-CM | POA: Diagnosis not present

## 2023-01-06 DIAGNOSIS — I509 Heart failure, unspecified: Secondary | ICD-10-CM | POA: Diagnosis not present

## 2023-01-06 DIAGNOSIS — D84821 Immunodeficiency due to drugs: Secondary | ICD-10-CM | POA: Diagnosis not present

## 2023-01-06 DIAGNOSIS — D472 Monoclonal gammopathy: Secondary | ICD-10-CM | POA: Diagnosis not present

## 2023-01-06 DIAGNOSIS — Z0000 Encounter for general adult medical examination without abnormal findings: Secondary | ICD-10-CM | POA: Diagnosis not present

## 2023-01-06 DIAGNOSIS — Z940 Kidney transplant status: Secondary | ICD-10-CM | POA: Diagnosis not present

## 2023-01-06 DIAGNOSIS — E1142 Type 2 diabetes mellitus with diabetic polyneuropathy: Secondary | ICD-10-CM | POA: Diagnosis not present

## 2023-01-12 DIAGNOSIS — Z940 Kidney transplant status: Secondary | ICD-10-CM | POA: Diagnosis not present

## 2023-01-20 DIAGNOSIS — R7881 Bacteremia: Secondary | ICD-10-CM | POA: Diagnosis not present

## 2023-01-20 DIAGNOSIS — L02211 Cutaneous abscess of abdominal wall: Secondary | ICD-10-CM | POA: Diagnosis not present

## 2023-01-25 DIAGNOSIS — Z940 Kidney transplant status: Secondary | ICD-10-CM | POA: Diagnosis not present

## 2023-01-26 DIAGNOSIS — R7881 Bacteremia: Secondary | ICD-10-CM | POA: Diagnosis not present

## 2023-01-26 DIAGNOSIS — L02211 Cutaneous abscess of abdominal wall: Secondary | ICD-10-CM | POA: Diagnosis not present

## 2023-02-22 DIAGNOSIS — E785 Hyperlipidemia, unspecified: Secondary | ICD-10-CM | POA: Diagnosis not present

## 2023-02-22 DIAGNOSIS — I4892 Unspecified atrial flutter: Secondary | ICD-10-CM | POA: Diagnosis not present

## 2023-02-22 DIAGNOSIS — E1121 Type 2 diabetes mellitus with diabetic nephropathy: Secondary | ICD-10-CM | POA: Diagnosis not present

## 2023-02-22 DIAGNOSIS — G4733 Obstructive sleep apnea (adult) (pediatric): Secondary | ICD-10-CM | POA: Diagnosis not present

## 2023-02-22 DIAGNOSIS — E119 Type 2 diabetes mellitus without complications: Secondary | ICD-10-CM | POA: Diagnosis not present

## 2023-02-22 DIAGNOSIS — I10 Essential (primary) hypertension: Secondary | ICD-10-CM | POA: Diagnosis not present

## 2023-02-22 DIAGNOSIS — I483 Typical atrial flutter: Secondary | ICD-10-CM | POA: Diagnosis not present

## 2023-02-22 DIAGNOSIS — Z940 Kidney transplant status: Secondary | ICD-10-CM | POA: Diagnosis not present

## 2023-02-22 DIAGNOSIS — E212 Other hyperparathyroidism: Secondary | ICD-10-CM | POA: Diagnosis not present

## 2023-02-22 DIAGNOSIS — D849 Immunodeficiency, unspecified: Secondary | ICD-10-CM | POA: Diagnosis not present

## 2023-02-23 DIAGNOSIS — I499 Cardiac arrhythmia, unspecified: Secondary | ICD-10-CM | POA: Diagnosis not present

## 2023-02-25 DIAGNOSIS — Z940 Kidney transplant status: Secondary | ICD-10-CM | POA: Diagnosis not present

## 2023-03-03 DIAGNOSIS — E212 Other hyperparathyroidism: Secondary | ICD-10-CM | POA: Diagnosis not present

## 2023-03-03 DIAGNOSIS — D351 Benign neoplasm of parathyroid gland: Secondary | ICD-10-CM | POA: Diagnosis not present

## 2023-03-27 DIAGNOSIS — Z940 Kidney transplant status: Secondary | ICD-10-CM | POA: Diagnosis not present

## 2023-03-28 DIAGNOSIS — T86898 Other complications of other transplanted tissue: Secondary | ICD-10-CM | POA: Diagnosis not present

## 2023-03-28 DIAGNOSIS — Z940 Kidney transplant status: Secondary | ICD-10-CM | POA: Diagnosis not present

## 2023-03-28 DIAGNOSIS — D849 Immunodeficiency, unspecified: Secondary | ICD-10-CM | POA: Diagnosis not present

## 2023-03-28 DIAGNOSIS — Z466 Encounter for fitting and adjustment of urinary device: Secondary | ICD-10-CM | POA: Diagnosis not present

## 2023-03-28 DIAGNOSIS — I10 Essential (primary) hypertension: Secondary | ICD-10-CM | POA: Diagnosis not present

## 2023-03-28 DIAGNOSIS — E1121 Type 2 diabetes mellitus with diabetic nephropathy: Secondary | ICD-10-CM | POA: Diagnosis not present

## 2023-04-18 DIAGNOSIS — E1121 Type 2 diabetes mellitus with diabetic nephropathy: Secondary | ICD-10-CM | POA: Diagnosis not present

## 2023-04-18 DIAGNOSIS — Z940 Kidney transplant status: Secondary | ICD-10-CM | POA: Diagnosis not present

## 2023-04-18 DIAGNOSIS — I4892 Unspecified atrial flutter: Secondary | ICD-10-CM | POA: Diagnosis not present

## 2023-04-18 DIAGNOSIS — G4733 Obstructive sleep apnea (adult) (pediatric): Secondary | ICD-10-CM | POA: Diagnosis not present

## 2023-04-18 DIAGNOSIS — Z7901 Long term (current) use of anticoagulants: Secondary | ICD-10-CM | POA: Diagnosis not present

## 2023-04-27 DIAGNOSIS — Z940 Kidney transplant status: Secondary | ICD-10-CM | POA: Diagnosis not present

## 2023-05-26 DIAGNOSIS — N99528 Other complication of other external stoma of urinary tract: Secondary | ICD-10-CM | POA: Diagnosis not present

## 2023-05-26 DIAGNOSIS — G4733 Obstructive sleep apnea (adult) (pediatric): Secondary | ICD-10-CM | POA: Diagnosis not present

## 2023-05-26 DIAGNOSIS — E785 Hyperlipidemia, unspecified: Secondary | ICD-10-CM | POA: Diagnosis not present

## 2023-05-26 DIAGNOSIS — Z940 Kidney transplant status: Secondary | ICD-10-CM | POA: Diagnosis not present

## 2023-05-26 DIAGNOSIS — E1121 Type 2 diabetes mellitus with diabetic nephropathy: Secondary | ICD-10-CM | POA: Diagnosis not present

## 2023-05-26 DIAGNOSIS — M109 Gout, unspecified: Secondary | ICD-10-CM | POA: Diagnosis not present

## 2023-05-26 DIAGNOSIS — Z7901 Long term (current) use of anticoagulants: Secondary | ICD-10-CM | POA: Diagnosis not present

## 2023-05-26 DIAGNOSIS — T8619 Other complication of kidney transplant: Secondary | ICD-10-CM | POA: Diagnosis not present

## 2023-05-26 DIAGNOSIS — N179 Acute kidney failure, unspecified: Secondary | ICD-10-CM | POA: Diagnosis not present

## 2023-05-26 DIAGNOSIS — Z794 Long term (current) use of insulin: Secondary | ICD-10-CM | POA: Diagnosis not present

## 2023-05-26 DIAGNOSIS — I10 Essential (primary) hypertension: Secondary | ICD-10-CM | POA: Diagnosis not present

## 2023-05-26 DIAGNOSIS — D849 Immunodeficiency, unspecified: Secondary | ICD-10-CM | POA: Diagnosis not present

## 2023-05-26 DIAGNOSIS — Z79899 Other long term (current) drug therapy: Secondary | ICD-10-CM | POA: Diagnosis not present

## 2023-05-26 DIAGNOSIS — K219 Gastro-esophageal reflux disease without esophagitis: Secondary | ICD-10-CM | POA: Diagnosis not present

## 2023-05-27 DIAGNOSIS — Z794 Long term (current) use of insulin: Secondary | ICD-10-CM | POA: Diagnosis not present

## 2023-05-27 DIAGNOSIS — T8619 Other complication of kidney transplant: Secondary | ICD-10-CM | POA: Diagnosis not present

## 2023-05-27 DIAGNOSIS — D849 Immunodeficiency, unspecified: Secondary | ICD-10-CM | POA: Diagnosis not present

## 2023-05-27 DIAGNOSIS — Z79899 Other long term (current) drug therapy: Secondary | ICD-10-CM | POA: Diagnosis not present

## 2023-05-27 DIAGNOSIS — Z940 Kidney transplant status: Secondary | ICD-10-CM | POA: Diagnosis not present

## 2023-05-27 DIAGNOSIS — G4733 Obstructive sleep apnea (adult) (pediatric): Secondary | ICD-10-CM | POA: Diagnosis not present

## 2023-05-27 DIAGNOSIS — M109 Gout, unspecified: Secondary | ICD-10-CM | POA: Diagnosis not present

## 2023-05-27 DIAGNOSIS — Z7901 Long term (current) use of anticoagulants: Secondary | ICD-10-CM | POA: Diagnosis not present

## 2023-05-27 DIAGNOSIS — E785 Hyperlipidemia, unspecified: Secondary | ICD-10-CM | POA: Diagnosis not present

## 2023-05-27 DIAGNOSIS — N99528 Other complication of other external stoma of urinary tract: Secondary | ICD-10-CM | POA: Diagnosis not present

## 2023-05-27 DIAGNOSIS — K219 Gastro-esophageal reflux disease without esophagitis: Secondary | ICD-10-CM | POA: Diagnosis not present

## 2023-05-27 DIAGNOSIS — N179 Acute kidney failure, unspecified: Secondary | ICD-10-CM | POA: Diagnosis not present

## 2023-05-27 DIAGNOSIS — I10 Essential (primary) hypertension: Secondary | ICD-10-CM | POA: Diagnosis not present

## 2023-05-29 DIAGNOSIS — N99528 Other complication of other external stoma of urinary tract: Secondary | ICD-10-CM | POA: Diagnosis not present

## 2023-05-29 DIAGNOSIS — T83022A Displacement of nephrostomy catheter, initial encounter: Secondary | ICD-10-CM | POA: Diagnosis not present

## 2023-05-30 DIAGNOSIS — Z940 Kidney transplant status: Secondary | ICD-10-CM | POA: Diagnosis not present

## 2023-05-30 DIAGNOSIS — T83092A Other mechanical complication of nephrostomy catheter, initial encounter: Secondary | ICD-10-CM | POA: Diagnosis not present

## 2023-06-07 DIAGNOSIS — Z794 Long term (current) use of insulin: Secondary | ICD-10-CM | POA: Diagnosis not present

## 2023-06-07 DIAGNOSIS — D849 Immunodeficiency, unspecified: Secondary | ICD-10-CM | POA: Diagnosis not present

## 2023-06-07 DIAGNOSIS — Z79899 Other long term (current) drug therapy: Secondary | ICD-10-CM | POA: Diagnosis not present

## 2023-06-07 DIAGNOSIS — Z940 Kidney transplant status: Secondary | ICD-10-CM | POA: Diagnosis not present

## 2023-06-07 DIAGNOSIS — E119 Type 2 diabetes mellitus without complications: Secondary | ICD-10-CM | POA: Diagnosis not present

## 2023-06-07 DIAGNOSIS — N99528 Other complication of other external stoma of urinary tract: Secondary | ICD-10-CM | POA: Diagnosis not present

## 2023-06-17 DIAGNOSIS — E1121 Type 2 diabetes mellitus with diabetic nephropathy: Secondary | ICD-10-CM | POA: Diagnosis not present

## 2023-06-17 DIAGNOSIS — T86898 Other complications of other transplanted tissue: Secondary | ICD-10-CM | POA: Diagnosis not present

## 2023-06-17 DIAGNOSIS — N35912 Unspecified bulbous urethral stricture, male: Secondary | ICD-10-CM | POA: Diagnosis not present

## 2023-06-21 DIAGNOSIS — Z940 Kidney transplant status: Secondary | ICD-10-CM | POA: Diagnosis not present

## 2023-06-21 DIAGNOSIS — Z978 Presence of other specified devices: Secondary | ICD-10-CM | POA: Diagnosis not present

## 2023-06-21 DIAGNOSIS — D849 Immunodeficiency, unspecified: Secondary | ICD-10-CM | POA: Diagnosis not present

## 2023-06-21 DIAGNOSIS — I10 Essential (primary) hypertension: Secondary | ICD-10-CM | POA: Diagnosis not present

## 2023-06-21 DIAGNOSIS — E1121 Type 2 diabetes mellitus with diabetic nephropathy: Secondary | ICD-10-CM | POA: Diagnosis not present

## 2023-06-27 DIAGNOSIS — Z940 Kidney transplant status: Secondary | ICD-10-CM | POA: Diagnosis not present

## 2023-07-11 DIAGNOSIS — E1142 Type 2 diabetes mellitus with diabetic polyneuropathy: Secondary | ICD-10-CM | POA: Diagnosis not present

## 2023-07-11 DIAGNOSIS — N184 Chronic kidney disease, stage 4 (severe): Secondary | ICD-10-CM | POA: Diagnosis not present

## 2023-07-11 DIAGNOSIS — G4730 Sleep apnea, unspecified: Secondary | ICD-10-CM | POA: Diagnosis not present

## 2023-07-11 DIAGNOSIS — D472 Monoclonal gammopathy: Secondary | ICD-10-CM | POA: Diagnosis not present

## 2023-07-11 DIAGNOSIS — I700 Atherosclerosis of aorta: Secondary | ICD-10-CM | POA: Diagnosis not present

## 2023-07-11 DIAGNOSIS — I130 Hypertensive heart and chronic kidney disease with heart failure and stage 1 through stage 4 chronic kidney disease, or unspecified chronic kidney disease: Secondary | ICD-10-CM | POA: Diagnosis not present

## 2023-07-11 DIAGNOSIS — M109 Gout, unspecified: Secondary | ICD-10-CM | POA: Diagnosis not present

## 2023-07-11 DIAGNOSIS — E1122 Type 2 diabetes mellitus with diabetic chronic kidney disease: Secondary | ICD-10-CM | POA: Diagnosis not present

## 2023-07-11 DIAGNOSIS — Z940 Kidney transplant status: Secondary | ICD-10-CM | POA: Diagnosis not present

## 2023-07-11 DIAGNOSIS — E782 Mixed hyperlipidemia: Secondary | ICD-10-CM | POA: Diagnosis not present

## 2023-07-11 DIAGNOSIS — D849 Immunodeficiency, unspecified: Secondary | ICD-10-CM | POA: Diagnosis not present

## 2023-07-11 DIAGNOSIS — I509 Heart failure, unspecified: Secondary | ICD-10-CM | POA: Diagnosis not present

## 2023-07-26 DIAGNOSIS — Z79899 Other long term (current) drug therapy: Secondary | ICD-10-CM | POA: Diagnosis not present

## 2023-07-26 DIAGNOSIS — E139 Other specified diabetes mellitus without complications: Secondary | ICD-10-CM | POA: Diagnosis not present

## 2023-07-26 DIAGNOSIS — Z79621 Long term (current) use of calcineurin inhibitor: Secondary | ICD-10-CM | POA: Diagnosis not present

## 2023-07-26 DIAGNOSIS — Z940 Kidney transplant status: Secondary | ICD-10-CM | POA: Diagnosis not present

## 2023-07-26 DIAGNOSIS — Z4822 Encounter for aftercare following kidney transplant: Secondary | ICD-10-CM | POA: Diagnosis not present

## 2023-07-26 DIAGNOSIS — I10 Essential (primary) hypertension: Secondary | ICD-10-CM | POA: Diagnosis not present

## 2023-07-26 DIAGNOSIS — D84821 Immunodeficiency due to drugs: Secondary | ICD-10-CM | POA: Diagnosis not present

## 2023-07-26 DIAGNOSIS — D509 Iron deficiency anemia, unspecified: Secondary | ICD-10-CM | POA: Diagnosis not present

## 2023-07-28 DIAGNOSIS — Z940 Kidney transplant status: Secondary | ICD-10-CM | POA: Diagnosis not present

## 2023-08-02 DIAGNOSIS — Z940 Kidney transplant status: Secondary | ICD-10-CM | POA: Diagnosis not present

## 2023-08-09 DIAGNOSIS — E119 Type 2 diabetes mellitus without complications: Secondary | ICD-10-CM | POA: Diagnosis not present

## 2023-08-09 DIAGNOSIS — I2510 Atherosclerotic heart disease of native coronary artery without angina pectoris: Secondary | ICD-10-CM | POA: Diagnosis not present

## 2023-08-09 DIAGNOSIS — I10 Essential (primary) hypertension: Secondary | ICD-10-CM | POA: Diagnosis not present

## 2023-08-09 DIAGNOSIS — E785 Hyperlipidemia, unspecified: Secondary | ICD-10-CM | POA: Diagnosis not present

## 2023-08-09 DIAGNOSIS — I4892 Unspecified atrial flutter: Secondary | ICD-10-CM | POA: Diagnosis not present

## 2023-08-31 ENCOUNTER — Institutional Professional Consult (permissible substitution): Payer: No Typology Code available for payment source | Admitting: Emergency Medicine

## 2023-09-13 DIAGNOSIS — N35912 Unspecified bulbous urethral stricture, male: Secondary | ICD-10-CM | POA: Diagnosis not present

## 2023-09-13 DIAGNOSIS — Z79621 Long term (current) use of calcineurin inhibitor: Secondary | ICD-10-CM | POA: Diagnosis not present

## 2023-09-13 DIAGNOSIS — N99528 Other complication of other external stoma of urinary tract: Secondary | ICD-10-CM | POA: Diagnosis not present

## 2023-09-13 DIAGNOSIS — Z79899 Other long term (current) drug therapy: Secondary | ICD-10-CM | POA: Diagnosis not present

## 2023-09-13 DIAGNOSIS — K219 Gastro-esophageal reflux disease without esophagitis: Secondary | ICD-10-CM | POA: Diagnosis not present

## 2023-09-13 DIAGNOSIS — Z940 Kidney transplant status: Secondary | ICD-10-CM | POA: Diagnosis not present

## 2023-09-13 DIAGNOSIS — Z8619 Personal history of other infectious and parasitic diseases: Secondary | ICD-10-CM | POA: Diagnosis not present

## 2023-09-13 DIAGNOSIS — D849 Immunodeficiency, unspecified: Secondary | ICD-10-CM | POA: Diagnosis not present

## 2023-09-13 DIAGNOSIS — N2581 Secondary hyperparathyroidism of renal origin: Secondary | ICD-10-CM | POA: Diagnosis not present

## 2023-09-13 DIAGNOSIS — Z7901 Long term (current) use of anticoagulants: Secondary | ICD-10-CM | POA: Diagnosis not present

## 2023-09-13 DIAGNOSIS — T86898 Other complications of other transplanted tissue: Secondary | ICD-10-CM | POA: Diagnosis not present

## 2023-09-13 DIAGNOSIS — M14672 Charcot's joint, left ankle and foot: Secondary | ICD-10-CM | POA: Diagnosis not present

## 2023-09-13 DIAGNOSIS — E1142 Type 2 diabetes mellitus with diabetic polyneuropathy: Secondary | ICD-10-CM | POA: Diagnosis not present

## 2023-09-13 DIAGNOSIS — Z5181 Encounter for therapeutic drug level monitoring: Secondary | ICD-10-CM | POA: Diagnosis not present

## 2023-09-13 DIAGNOSIS — E1121 Type 2 diabetes mellitus with diabetic nephropathy: Secondary | ICD-10-CM | POA: Diagnosis not present

## 2023-09-13 DIAGNOSIS — G4733 Obstructive sleep apnea (adult) (pediatric): Secondary | ICD-10-CM | POA: Diagnosis not present

## 2023-10-18 DIAGNOSIS — Z5181 Encounter for therapeutic drug level monitoring: Secondary | ICD-10-CM | POA: Diagnosis not present

## 2023-10-18 DIAGNOSIS — Z79899 Other long term (current) drug therapy: Secondary | ICD-10-CM | POA: Diagnosis not present

## 2023-10-18 DIAGNOSIS — S80821A Blister (nonthermal), right lower leg, initial encounter: Secondary | ICD-10-CM | POA: Diagnosis not present

## 2023-10-18 DIAGNOSIS — Z79621 Long term (current) use of calcineurin inhibitor: Secondary | ICD-10-CM | POA: Diagnosis not present

## 2023-10-18 DIAGNOSIS — Z940 Kidney transplant status: Secondary | ICD-10-CM | POA: Diagnosis not present

## 2023-10-18 DIAGNOSIS — E139 Other specified diabetes mellitus without complications: Secondary | ICD-10-CM | POA: Diagnosis not present

## 2023-10-18 DIAGNOSIS — Z4822 Encounter for aftercare following kidney transplant: Secondary | ICD-10-CM | POA: Diagnosis not present

## 2023-10-28 ENCOUNTER — Institutional Professional Consult (permissible substitution): Payer: No Typology Code available for payment source | Admitting: Emergency Medicine

## 2023-11-17 DIAGNOSIS — Z79621 Long term (current) use of calcineurin inhibitor: Secondary | ICD-10-CM | POA: Diagnosis not present

## 2023-11-17 DIAGNOSIS — E139 Other specified diabetes mellitus without complications: Secondary | ICD-10-CM | POA: Diagnosis not present

## 2023-11-17 DIAGNOSIS — D849 Immunodeficiency, unspecified: Secondary | ICD-10-CM | POA: Diagnosis not present

## 2023-11-17 DIAGNOSIS — Z4822 Encounter for aftercare following kidney transplant: Secondary | ICD-10-CM | POA: Diagnosis not present

## 2023-11-17 DIAGNOSIS — Z940 Kidney transplant status: Secondary | ICD-10-CM | POA: Diagnosis not present

## 2023-11-17 DIAGNOSIS — Z5181 Encounter for therapeutic drug level monitoring: Secondary | ICD-10-CM | POA: Diagnosis not present

## 2023-11-17 DIAGNOSIS — S80821D Blister (nonthermal), right lower leg, subsequent encounter: Secondary | ICD-10-CM | POA: Diagnosis not present

## 2023-11-29 DIAGNOSIS — Z936 Other artificial openings of urinary tract status: Secondary | ICD-10-CM | POA: Diagnosis not present

## 2023-11-29 DIAGNOSIS — I509 Heart failure, unspecified: Secondary | ICD-10-CM | POA: Diagnosis not present

## 2023-11-29 DIAGNOSIS — E1122 Type 2 diabetes mellitus with diabetic chronic kidney disease: Secondary | ICD-10-CM | POA: Diagnosis not present

## 2023-11-29 DIAGNOSIS — E782 Mixed hyperlipidemia: Secondary | ICD-10-CM | POA: Diagnosis not present

## 2023-11-29 DIAGNOSIS — Z940 Kidney transplant status: Secondary | ICD-10-CM | POA: Diagnosis not present

## 2023-11-29 DIAGNOSIS — E1142 Type 2 diabetes mellitus with diabetic polyneuropathy: Secondary | ICD-10-CM | POA: Diagnosis not present

## 2023-11-29 DIAGNOSIS — N184 Chronic kidney disease, stage 4 (severe): Secondary | ICD-10-CM | POA: Diagnosis not present

## 2023-11-29 DIAGNOSIS — I4891 Unspecified atrial fibrillation: Secondary | ICD-10-CM | POA: Diagnosis not present

## 2023-11-29 DIAGNOSIS — Z23 Encounter for immunization: Secondary | ICD-10-CM | POA: Diagnosis not present

## 2023-12-14 ENCOUNTER — Institutional Professional Consult (permissible substitution): Payer: No Typology Code available for payment source | Admitting: Emergency Medicine

## 2023-12-15 ENCOUNTER — Telehealth: Payer: Self-pay | Admitting: Emergency Medicine

## 2023-12-15 NOTE — Telephone Encounter (Signed)
Patient cannot cancel appointment it will be no longer authorized by [facility]-as of 12/15/23 must keep 2/27 apppointment

## 2023-12-15 NOTE — Telephone Encounter (Signed)
Patient's [facility] authorization, [id number] is valid through 06/11/2024.

## 2023-12-22 DIAGNOSIS — Z794 Long term (current) use of insulin: Secondary | ICD-10-CM | POA: Diagnosis not present

## 2023-12-22 DIAGNOSIS — Z940 Kidney transplant status: Secondary | ICD-10-CM | POA: Diagnosis not present

## 2023-12-22 DIAGNOSIS — I10 Essential (primary) hypertension: Secondary | ICD-10-CM | POA: Diagnosis not present

## 2023-12-22 DIAGNOSIS — Z978 Presence of other specified devices: Secondary | ICD-10-CM | POA: Diagnosis not present

## 2023-12-22 DIAGNOSIS — E212 Other hyperparathyroidism: Secondary | ICD-10-CM | POA: Diagnosis not present

## 2023-12-22 DIAGNOSIS — E1121 Type 2 diabetes mellitus with diabetic nephropathy: Secondary | ICD-10-CM | POA: Diagnosis not present

## 2023-12-22 DIAGNOSIS — D849 Immunodeficiency, unspecified: Secondary | ICD-10-CM | POA: Diagnosis not present

## 2024-01-11 ENCOUNTER — Telehealth: Payer: Self-pay | Admitting: *Deleted

## 2024-01-11 NOTE — Telephone Encounter (Signed)
 I called the patient and advised him that I was unable to get anyone at the [place] on the phone to give [place] access to the CT scan he had done in September of 2024.  I told him we would work on authorization to have him seen at a later time while he gets his CT scan put on a disc at the [place].  I told him I would cancel his appointment for tomorrow and reschedule once we have authorization from the [place] and he has the CT scan on a disc.  He verbalized understanding.  Nothing further needed.

## 2024-01-11 NOTE — Telephone Encounter (Signed)
 I called the patient regarding the CT scan, he does not have a copy of the scan.  Advised I would call the [facility] and see what I could find out and see if they could get the scan to [place].  He verbalized understanding.  I attempted to reach the [place] numerous times at several different numbers.  I was transferred to CT and they transferred me to a number that just rang and no one answered.

## 2024-01-12 ENCOUNTER — Institutional Professional Consult (permissible substitution): Payer: No Typology Code available for payment source | Admitting: Emergency Medicine

## 2024-02-13 DIAGNOSIS — M109 Gout, unspecified: Secondary | ICD-10-CM | POA: Diagnosis not present

## 2024-02-13 DIAGNOSIS — E1122 Type 2 diabetes mellitus with diabetic chronic kidney disease: Secondary | ICD-10-CM | POA: Diagnosis not present

## 2024-02-13 DIAGNOSIS — I509 Heart failure, unspecified: Secondary | ICD-10-CM | POA: Diagnosis not present

## 2024-02-13 DIAGNOSIS — I4891 Unspecified atrial fibrillation: Secondary | ICD-10-CM | POA: Diagnosis not present

## 2024-02-13 DIAGNOSIS — I130 Hypertensive heart and chronic kidney disease with heart failure and stage 1 through stage 4 chronic kidney disease, or unspecified chronic kidney disease: Secondary | ICD-10-CM | POA: Diagnosis not present

## 2024-02-13 DIAGNOSIS — Z23 Encounter for immunization: Secondary | ICD-10-CM | POA: Diagnosis not present

## 2024-02-13 DIAGNOSIS — D472 Monoclonal gammopathy: Secondary | ICD-10-CM | POA: Diagnosis not present

## 2024-02-13 DIAGNOSIS — N184 Chronic kidney disease, stage 4 (severe): Secondary | ICD-10-CM | POA: Diagnosis not present

## 2024-02-13 DIAGNOSIS — Z0000 Encounter for general adult medical examination without abnormal findings: Secondary | ICD-10-CM | POA: Diagnosis not present

## 2024-02-13 DIAGNOSIS — E782 Mixed hyperlipidemia: Secondary | ICD-10-CM | POA: Diagnosis not present

## 2024-02-13 DIAGNOSIS — E1142 Type 2 diabetes mellitus with diabetic polyneuropathy: Secondary | ICD-10-CM | POA: Diagnosis not present

## 2024-04-13 ENCOUNTER — Emergency Department (HOSPITAL_COMMUNITY)

## 2024-04-13 ENCOUNTER — Other Ambulatory Visit: Payer: Self-pay

## 2024-04-13 ENCOUNTER — Emergency Department (HOSPITAL_COMMUNITY)
Admission: EM | Admit: 2024-04-13 | Discharge: 2024-04-14 | Disposition: A | Discharge status: Other Acute Inpatient Hospital | Attending: Emergency Medicine | Admitting: Emergency Medicine

## 2024-04-13 DIAGNOSIS — Z794 Long term (current) use of insulin: Secondary | ICD-10-CM | POA: Diagnosis not present

## 2024-04-13 DIAGNOSIS — R600 Localized edema: Secondary | ICD-10-CM | POA: Insufficient documentation

## 2024-04-13 DIAGNOSIS — E119 Type 2 diabetes mellitus without complications: Secondary | ICD-10-CM | POA: Insufficient documentation

## 2024-04-13 DIAGNOSIS — Z87891 Personal history of nicotine dependence: Secondary | ICD-10-CM | POA: Diagnosis not present

## 2024-04-13 DIAGNOSIS — R1031 Right lower quadrant pain: Secondary | ICD-10-CM | POA: Insufficient documentation

## 2024-04-13 DIAGNOSIS — N179 Acute kidney failure, unspecified: Secondary | ICD-10-CM | POA: Diagnosis not present

## 2024-04-13 DIAGNOSIS — Z79899 Other long term (current) drug therapy: Secondary | ICD-10-CM | POA: Insufficient documentation

## 2024-04-13 DIAGNOSIS — R531 Weakness: Secondary | ICD-10-CM | POA: Diagnosis present

## 2024-04-13 DIAGNOSIS — I10 Essential (primary) hypertension: Secondary | ICD-10-CM | POA: Insufficient documentation

## 2024-04-13 LAB — CBC
HCT: 31.7 % — ABNORMAL LOW (ref 39.0–52.0)
Hemoglobin: 9.7 g/dL — ABNORMAL LOW (ref 13.0–17.0)
MCH: 21.1 pg — ABNORMAL LOW (ref 26.0–34.0)
MCHC: 30.6 g/dL (ref 30.0–36.0)
MCV: 69.1 fL — ABNORMAL LOW (ref 80.0–100.0)
Platelets: 294 10*3/uL (ref 150–400)
RBC: 4.59 MIL/uL (ref 4.22–5.81)
RDW: 16.8 % — ABNORMAL HIGH (ref 11.5–15.5)
WBC: 16.6 10*3/uL — ABNORMAL HIGH (ref 4.0–10.5)
nRBC: 0.1 % (ref 0.0–0.2)

## 2024-04-13 LAB — BLOOD GAS, VENOUS
Acid-base deficit: 15.9 mmol/L — ABNORMAL HIGH (ref 0.0–2.0)
Bicarbonate: 10.6 mmol/L — ABNORMAL LOW (ref 20.0–28.0)
O2 Saturation: 63.1 %
Patient temperature: 37
pCO2, Ven: 27 mmHg — ABNORMAL LOW (ref 44–60)
pH, Ven: 7.2 — ABNORMAL LOW (ref 7.25–7.43)
pO2, Ven: 36 mmHg (ref 32–45)

## 2024-04-13 LAB — COMPREHENSIVE METABOLIC PANEL WITH GFR
ALT: 15 U/L (ref 0–44)
AST: 22 U/L (ref 15–41)
Albumin: 2.4 g/dL — ABNORMAL LOW (ref 3.5–5.0)
Alkaline Phosphatase: 148 U/L — ABNORMAL HIGH (ref 38–126)
Anion gap: 19 — ABNORMAL HIGH (ref 5–15)
BUN: 119 mg/dL — ABNORMAL HIGH (ref 8–23)
CO2: 11 mmol/L — ABNORMAL LOW (ref 22–32)
Calcium: 6.7 mg/dL — ABNORMAL LOW (ref 8.9–10.3)
Chloride: 97 mmol/L — ABNORMAL LOW (ref 98–111)
Creatinine, Ser: 14.57 mg/dL — ABNORMAL HIGH (ref 0.61–1.24)
GFR, Estimated: 3 mL/min — ABNORMAL LOW (ref >60)
Glucose, Bld: 454 mg/dL — ABNORMAL HIGH (ref 70–99)
Potassium: 4.4 mmol/L (ref 3.5–5.1)
Sodium: 127 mmol/L — ABNORMAL LOW (ref 135–145)
Total Bilirubin: 0.7 mg/dL (ref 0.0–1.2)
Total Protein: 6.8 g/dL (ref 6.5–8.1)

## 2024-04-13 LAB — I-STAT CG4 LACTIC ACID, ED
Lactic Acid, Venous: 1.2 mmol/L (ref 0.5–1.9)
Lactic Acid, Venous: 1.1 mmol/L (ref 0.5–1.9)

## 2024-04-13 LAB — BRAIN NATRIURETIC PEPTIDE: B Natriuretic Peptide: 181.6 pg/mL — ABNORMAL HIGH (ref 0.0–100.0)

## 2024-04-13 LAB — TROPONIN I (HIGH SENSITIVITY)
Troponin I (High Sensitivity): 26 ng/L — ABNORMAL HIGH (ref <18)
Troponin I (High Sensitivity): 28 ng/L — ABNORMAL HIGH (ref <18)

## 2024-04-13 LAB — PROTIME-INR
INR: 2.4 — ABNORMAL HIGH (ref 0.8–1.2)
Prothrombin Time: 26.1 s — ABNORMAL HIGH (ref 11.4–15.2)

## 2024-04-13 MED ORDER — VANCOMYCIN HCL 10 G IV SOLR
2000.0000 mg | Freq: Once | INTRAVENOUS | Status: AC
  Administered 2024-04-13: 2000 mg via INTRAVENOUS
  Filled 2024-04-13: qty 40

## 2024-04-13 MED ORDER — VANCOMYCIN HCL 2000 MG/400ML IV SOLN
2000.0000 mg | Freq: Once | INTRAVENOUS | Status: DC
  Filled 2024-04-13: qty 400

## 2024-04-13 MED ORDER — LACTATED RINGERS IV BOLUS
1000.0000 mL | Freq: Once | INTRAVENOUS | Status: AC
  Administered 2024-04-13: 1000 mL via INTRAVENOUS

## 2024-04-13 MED ORDER — SODIUM CHLORIDE 0.9 % IV SOLN
2.0000 g | Freq: Once | INTRAVENOUS | Status: AC
  Administered 2024-04-13: 2 g via INTRAVENOUS
  Filled 2024-04-13: qty 20

## 2024-04-13 NOTE — Discharge Instructions (Signed)
 Pt will be transferred to [facility]

## 2024-04-13 NOTE — ED Provider Notes (Signed)
 CRITICAL CARE Performed by: Cheyenne Cotta Total critical care time: 55 minutes Critical care time was exclusive of separately billable procedures and treating other patients. Critical care was necessary to treat or prevent imminent or life-threatening deterioration. Critical care was time spent personally by me on the following activities: development of treatment plan with patient and/or surrogate as well as nursing, discussions with consultants, evaluation of patient's response to treatment, examination of patient, obtaining history from patient or surrogate, ordering and performing treatments and interventions, ordering and review of laboratory studies, ordering and review of radiographic studies, pulse oximetry and re-evaluation of patient's condition.    Cheyenne Cotta, MD 04/13/24 2106

## 2024-04-13 NOTE — ED Notes (Addendum)
 CT queried re: timeframe, "will be next 1 or 2"

## 2024-04-13 NOTE — ED Notes (Signed)
 To CT at this time. Alert, NAD, calm, no changes. Vanc paused. Wife at [facility]. Pt and spouse verbalize, "has barely made urine (void or in urostomy tube) since nephrostomy tube placed".

## 2024-04-13 NOTE — ED Provider Triage Note (Signed)
 Emergency Medicine Provider Triage Evaluation Note  [name] , a 68 y.o. male  was evaluated in triage.  Pt complains of shortness of breath and weakness.  He status post nephrostomy tube placement.  Status post renal transplant and has a urethroplasty scheduled in August.  Patient's wife says that he always gets "like this" when he has his nephrostomy tube change.  She is afraid he is sick again.  He apparently has oxygen  at home but has never had any real problems with shortness of breath or breathing issues.  Patient is notably very tachypneic  Review of Systems  Positive: Shortness of breath and weakness Negative: Vomiting  Physical Exam  BP 113/68 (BP Location: Left Arm)   Pulse 95   Temp 98.3 F (36.8 C) (Oral)   Resp (!) 22   SpO2 98%  Gen:   Awake, no distress   Resp:  Normal effort  MSK:   Moves extremities without difficulty  Other:    Medical Decision Making  Medically screening exam initiated at 2:31 PM.  Appropriate orders placed.  [name] was informed that the remainder of the evaluation will be completed by another provider, this initial triage assessment does not replace that evaluation, and the importance of remaining in the ED until their evaluation is complete.     Tama Fails, PA-C 04/13/24 1432

## 2024-04-13 NOTE — Progress Notes (Signed)
 Pharmacy Note   A consult was received from an ED physician for vancomycin  per pharmacy dosing.    The patient's profile has been reviewed for ht/wt/allergies/indication/available labs.    A one time order has been placed for vancomycin  2000 mg IV x1 .    Further antibiotics/pharmacy consults should be ordered by admitting physician if indicated.                       Thank you,  Raydell Maners, PharmD, BCPS 04/13/2024 3:44 PM

## 2024-04-13 NOTE — ED Notes (Signed)
 No urine in urostomy bag, will monitor, pending UA and Cx

## 2024-04-13 NOTE — ED Provider Notes (Signed)
 [place] EMERGENCY DEPARTMENT AT [facility] Provider Note  CSN: [id number] Arrival date & time: 04/13/24 1409  Chief Complaint(s) Weakness (Pt c/o feeling "bad" after nephrostomy tube change last Monday. Pt had kidney transplant two years ago, and this feeling has happened before. Pt is sob, headache. )  HPI [name] is a 68 y.o. male history of CHF, diabetes, GERD, hypertension, hyperlipidemia, renal transplant, urethral stricture status post nephrostomy, presenting to the emergency department with fatigue and generalized weakness.  Patient reports that had his nephrostomy tube placed around a week ago.  Has been having gradually worsening fatigue and generalized weakness, no fevers but reports chills.  Reports some abdominal pain around nephrostomy site.  Reports it is draining okay.  Reports sometimes when he has his tube changed he gets very sick and needs to stay in the hospital.  Reports that he feels like that today.  Has been having nausea and vomiting at home as well.  Also feels some shortness of breath and fatigue.  Having normal bowel movement.   Past Medical History Past Medical History:  Diagnosis Date   Alcohol abuse    Congestive heart disease (HCC) 12/2011   Diabetes mellitus    GERD (gastroesophageal reflux disease)    Gout    Headache    Hepatitis    being treated for Hepatitis C   HOH (hard of hearing)    occasional wears hearing aids   Hyperlipidemia    Hyperlipidemia    Hypertension    Monoclonal gammopathies 02/25/2012   Obesity    Pedal edema    Pneumonia    Renal insufficiency    MWD - [facility]   Sleep apnea    does not use cpap   Tobacco abuse    Wears hearing aid    b/l   Patient Active Problem List   Diagnosis Date Noted   Coronary artery disease 12/29/2021   Vitamin D  deficiency 07/14/2021   Adjustment disorder with mixed anxiety and depressed mood 07/14/2021   Bilateral pseudophakia 07/14/2021   Dental caries  07/14/2021   Abnormal finding on imaging of liver 08/28/2020   Colon cancer screening 08/28/2020   Bacterial infection, unspecified 02/25/2020   COVID-19 01/21/2020   Pressure injury of skin 01/12/2020   Pneumonia due to COVID-19 virus 01/11/2020   Hyponatremia 01/11/2020   Hyperkalemia 01/11/2020   Anemia 01/11/2020   Respiratory failure with hypoxia (HCC) 01/11/2020   BMI 37.0-37.9, adult 01/11/2020   Charcot's joint of left foot 12/29/2018   Effusion of lower leg joint 07/13/2018   Absolute anemia 07/13/2018   Benign essential HTN 07/13/2018   Carpal tunnel syndrome of right wrist 05/19/2017   Hand pain, right 05/10/2017   Bacteremia 03/15/2017   Gram-neg septicemia (HCC)    Scrotal edema    ESRD (end stage renal disease) on dialysis (HCC)    Scrotal pain    Sepsis (HCC)    Acute respiratory failure with hypoxia (HCC) 03/04/2017   Scrotal swelling 03/04/2017   Chronic hepatitis C without hepatic coma (HCC) 11/17/2016   Dependence on renal dialysis (HCC) 09/03/2015   Complication of vascular dialysis catheter 08/01/2014   Pulmonary nodule 04/02/2014   Sleep apnea 04/02/2014   Allergic rhinitis 04/02/2014   End stage renal disease (HCC) 02/13/2014   Diabetes mellitus type 2, uncontrolled 01/21/2014   PNA (pneumonia) 01/21/2014   Anemia in chronic kidney disease 01/12/2014   Coagulation defect, unspecified (HCC) 01/12/2014   Pedal edema  Monoclonal gammopathies 02/25/2012   Obesity 01/10/2012   Chronic kidney disease 01/10/2012   Diabetes mellitus (HCC) 01/10/2012   Hypertension    Hyperlipidemia    Gout    Alcohol abuse    Tobacco abuse    Home Medication(s) Prior to Admission medications   Medication Sig Start Date End Date Taking? Authorizing Provider  acetaminophen  (TYLENOL ) 500 MG tablet Take by mouth.    [provider]  ACETAMINOPHEN  PO Take 1 tablet by mouth 3 (three) times daily as needed. 04/17/19   [provider]  allopurinol   (ZYLOPRIM ) 100 MG tablet Take 1 tablet (100 mg total) by mouth daily. Patient taking differently: Take 100 mg by mouth 2 (two) times daily.  03/14/17   Rai, Hurman Maiden, MD  allopurinol  (ZYLOPRIM ) 100 MG tablet 2 tablets    [provider]  amLODipine  (NORVASC ) 5 MG tablet Take 5 mg by mouth daily.    [provider]  amLODipine  (NORVASC ) 5 MG tablet 1 tablet    [provider]  ammonium lactate  (AMLACTIN) 12 % lotion Apply 1 application topically 2 (two) times daily. 12/29/21   Luella Sager, DPM  carboxymethylcellulose (REFRESH PLUS) 0.5 % SOLN INSTILL 1 DROP IN BOTH EYES FOUR TIMES A DAY AS NEEDED 12/16/20   [provider]  Carboxymethylcellulose Sodium 1 % GEL APPLY 1 DROP TO EACH EYE AT BEDTIME 12/16/20   [provider]  carvedilol  (COREG ) 12.5 MG tablet Take 0.5 tablets by mouth 2 (two) times daily. 02/29/20   [provider]  carvedilol  (COREG ) 3.125 MG tablet Take 3.125 mg by mouth 2 (two) times daily. 05/07/20   [provider]  cinacalcet  (SENSIPAR ) 60 MG tablet Take 2 tablets by mouth daily. 06/16/20   [provider]  colchicine  0.6 MG tablet Take 0.6 mg by mouth daily.    [provider]  dicyclomine (BENTYL) 20 MG tablet Take 1 tablet by mouth 3 (three) times daily. 11/27/20   [provider]  diphenhydrAMINE  (BENADRYL ) 25 mg capsule 1 capsule    [provider]  diphenhydrAMINE  (BENADRYL ) 25 MG tablet Take 25 mg by mouth every Monday, Wednesday, and Friday.    [provider]  diphenhydrAMINE -APAP, sleep, (DIPHENHYDRAMINE -APAP PO) TAKE 1 CAPSULE BY MOUTH THREE TIMES A DAY AS NEEDED 04/17/19   [provider]  doxycycline (MONODOX) 100 MG capsule Take 100 mg by mouth 2 (two) times daily. 08/21/20   [provider]  ferric citrate  (AURYXIA ) 1 GM 210 MG(Fe) tablet 2 tablets with meals    [provider]  furosemide  (LASIX ) 40 MG tablet Take by mouth.     [provider]  FUROSEMIDE  PO Take 1 tablet by mouth daily. 04/17/19   [provider]  gentamicin  cream (GARAMYCIN ) 0.1 % Apply 1 application topically 2 (two) times daily. 05/07/20   Dot Gazella, DPM  insulin  detemir (LEVEMIR  FLEXTOUCH) 100 UNIT/ML FlexPen Inject into the skin. 05/03/17   [provider]  ketoconazole  (NIZORAL ) 2 % cream Apply to scaly skin on both feet and between toes qd x 6 weeks. 07/14/21   Luella Sager, DPM  levofloxacin  (LEVAQUIN ) 500 MG tablet Take 500 mg by mouth daily. 08/26/20   [provider]  midodrine  (PROAMATINE ) 5 MG tablet TAKE TWO TABLETS BY MOUTH MONDAY, WEDNESDAY AND FRIDAY -- TAKE 30 MINUTES BEFORE STARTING DIALYSIS 05/20/21   [provider]  omeprazole (PRILOSEC) 20 MG capsule TAKE ONE CAPSULE BY MOUTH DAILY FOR REFLUX/HEARTBURN (TAKE ON AN  EMPTY STOMACH 30 MINUTES PRIOR TO A MEAL) 04/11/20   [provider]  omeprazole (PRILOSEC) 20 MG capsule Take 1 capsule by mouth every morning. 04/11/20   [provider]  ondansetron  (ZOFRAN ) 4 MG tablet 1 tablet 01/07/20   [provider]  peg 3350  powder (MOVIPREP ) 100 g SOLR TAKE 1 KIT BY MOUTH ONCE 11/27/20   [provider]  pravastatin  (PRAVACHOL ) 20 MG tablet Take 1 tablet by mouth at bedtime. 04/11/20   [provider]  SANTYL ointment Apply topically. 08/19/20   [provider]  sevelamer carbonate (RENVELA) 800 MG tablet TAKE ONE TABLET BY MOUTH THREE TIMES A DAY BEFORE MEALS 03/09/21   [provider]  sildenafil (VIAGRA) 25 MG tablet TAKE ONE-HALF TABLET BY MOUTH AS DIRECTED (TAKE 1 HOUR PRIOR TO SEXUAL ACTIVITY *DO NOT EXCEED 1 DOSE PER 24 HOUR PERIOD*) 12/01/20   [provider]  sildenafil (VIAGRA) 25 MG tablet TAKE ONE-HALF TABLET BY MOUTH AS DIRECTED (TAKE 1 HOUR PRIOR TO SEXUAL ACTIVITY *DO NOT EXCEED 1 DOSE PER 24 HOUR PERIOD*) 12/01/20   [provider]                                                                                                                                     Past Surgical History Past Surgical History:  Procedure Laterality Date   AV FISTULA PLACEMENT Right 01/10/2014   Procedure: ARTERIOVENOUS (AV) FISTULA CREATION- RIGHT RADIOCEPHALIC ;  Surgeon: Dannis Dy, MD;  Location: [facility] OR;  Service: Vascular;  Laterality: Right;   BASCILIC VEIN TRANSPOSITION Right 03/06/2020   Procedure: RIGHT ARM BASILIC VEIN TRANSPOSITION FISTULA;  Surgeon: Mayo Speck, MD;  Location: [facility] OR;  Service: Vascular;  Laterality: Right;   COLONOSCOPY     EYE SURGERY     cataract surgery, bilateral   Ganglion cyst removal x 2     INSERTION OF DIALYSIS CATHETER N/A 01/07/2014   Procedure: INSERTION OF DIALYSIS CATHETER;  Surgeon: Arvil Lauber, MD;  Location: [facility] OR;  Service: Vascular;  Laterality: N/A;   IR FLUORO GUIDE CV LINE LEFT  01/18/2020   IR REMOVAL TUN CV CATH W/O FL  01/16/2020   IR US  GUIDE VASC ACCESS LEFT  01/18/2020   kidney remvoval Right 09/2019   [facility]   Left knee arthroscopy with generalized joint synovectomy,  01/2006   LIGATION OF COMPETING BRANCHES OF ARTERIOVENOUS FISTULA Right 05/28/2014   Procedure: LIGATION OF COMPETING BRANCHES OF ARTERIOVENOUS FISTULA;  Surgeon: Dannis Dy, MD;  Location: [facility] OR;  Service: Vascular;  Laterality: Right;   REVISON OF ARTERIOVENOUS FISTULA Right 01/19/2017   Procedure: PLICATION REPAIR OF ARTERIOVENOUS FISTULA PSEUDOANEURYSM;  Surgeon: Margherita Shell, MD;  Location: [facility] OR;  Service: Vascular;  Laterality: Right;   REVISON OF ARTERIOVENOUS FISTULA Right 08/08/2018   Procedure: REVISION PLICATION OF ARTERIOVENOUS FISTULA FOREARM;  Surgeon: Dannis Dy, MD;  Location: [facility] OR;  Service: Vascular;  Laterality: Right;   REVISON OF ARTERIOVENOUS FISTULA Right 08/02/2019   Procedure: Artegraft placement right arm arteriovenous graft;  Surgeon: Adine Hoof, MD;  Location: [facility] OR;   Service: Vascular;  Laterality: Right;   SCROTAL EXPLORATION N/A 03/11/2017   Procedure: SCROTUM EXPLORATION AND DEBRIDEMENT;  Surgeon: Trent Frizzle, MD;  Location: [facility] OR;  Service: Urology;  Laterality: N/A;   SCROTAL EXPLORATION N/A 03/21/2017   Procedure: REPEAT SCROTAL DEBRIDEMENT;  Surgeon: Trent Frizzle, MD;  Location: [facility] OR;  Service: Urology;  Laterality: N/A;   SHUNTOGRAM Right 05/14/2014   Procedure: FISTULOGRAM;  Surgeon: Margherita Shell, MD;  Location: [facility] CATH LAB;  Service: Cardiovascular;  Laterality: Right;   THROMBECTOMY W/ EMBOLECTOMY Right 07/04/2017   Procedure: EXPLORATION AND THROMBECTOMY ARTERIOVENOUS FISTULA RIGHT ARM;  Surgeon: Mayo Speck, MD;  Location: [facility] OR;  Service: Vascular;  Laterality: Right;   Family History Family History  Problem Relation Age of Onset   Diabetes Mother    Heart disease Mother    Lung cancer Father     Social History Social History   Tobacco Use   Smoking status: Former    Current packs/day: 0.00    Average packs/day: 0.3 packs/day for 33.0 years (9.9 ttl pk-yrs)    Types: Cigarettes    Start date: 07/16/1977    Quit date: 07/16/2010    Years since quitting: 13.7   Smokeless tobacco: Never  Vaping Use   Vaping status: Never Used  Substance Use Topics   Alcohol use: No   Drug use: No   Allergies Celecoxib, Contrast media [iodinated contrast media], Other, Penicillin g, Penicillin v, Penicillins, Propolis, and Rofecoxib  Review of Systems Review of Systems  All other systems reviewed and are negative.   Physical Exam Vital Signs  I have reviewed the triage vital signs BP 119/73   Pulse 99   Temp 98 F (36.7 C)   Resp (!) 25   Ht 6\' 1"  (1.854 m)   Wt (!) 140.6 kg   SpO2 100%   BMI 40.90 kg/m  Physical Exam Vitals and nursing note reviewed.  Constitutional:      General: He is in acute distress.     Appearance: Normal appearance. He is obese. He is ill-appearing.  HENT:     Mouth/Throat:     Mouth:  Mucous membranes are dry.  Eyes:     Conjunctiva/sclera: Conjunctivae normal.  Cardiovascular:     Rate and Rhythm: Normal rate and regular rhythm.  Pulmonary:     Effort: Pulmonary effort is normal. No respiratory distress.     Breath sounds: Normal breath sounds.  Abdominal:     General: Abdomen is flat.     Palpations: Abdomen is soft.     Tenderness: There is abdominal tenderness (RLQ around nephrostomy).     Comments: Right lower quadrant nephrostomy tube in place, some purulent drainage around tube site insurance  Musculoskeletal:     Right lower leg: Edema present.     Left lower leg: Edema present.     Comments: Right upper extremity fistula with thrill  Skin:    General: Skin is warm and dry.     Capillary Refill: Capillary refill takes less than 2 seconds.  Neurological:     Mental Status: He is alert and oriented to person, place, and time. Mental status is at baseline.  Psychiatric:        Mood and Affect: Mood normal.  Behavior: Behavior normal.     ED Results and Treatments Labs (all labs ordered are listed, but only abnormal results are displayed) Labs Reviewed  BLOOD CULTURE ID PANEL (REFLEXED) - BCID2 - Abnormal; Notable for the following components:      Result Value   Enterobacterales DETECTED (*)    Serratia marcescens DETECTED (*)    All other components within normal limits  CBC - Abnormal; Notable for the following components:   WBC 16.6 (*)    Hemoglobin 9.7 (*)    HCT 31.7 (*)    MCV 69.1 (*)    MCH 21.1 (*)    RDW 16.8 (*)    All other components within normal limits  PROTIME-INR - Abnormal; Notable for the following components:   Prothrombin Time 26.1 (*)    INR 2.4 (*)    All other components within normal limits  COMPREHENSIVE METABOLIC PANEL WITH GFR - Abnormal; Notable for the following components:   Sodium 127 (*)    Chloride 97 (*)    CO2 11 (*)    Glucose, Bld 454 (*)    BUN 119 (*)    Creatinine, Ser 14.57 (*)    Calcium   6.7 (*)    Albumin 2.4 (*)    Alkaline Phosphatase 148 (*)    GFR, Estimated 3 (*)    Anion gap 19 (*)    All other components within normal limits  BRAIN NATRIURETIC PEPTIDE - Abnormal; Notable for the following components:   B Natriuretic Peptide 181.6 (*)    All other components within normal limits  BLOOD GAS, VENOUS - Abnormal; Notable for the following components:   pH, Ven 7.2 (*)    pCO2, Ven 27 (*)    Bicarbonate 10.6 (*)    Acid-base deficit 15.9 (*)    All other components within normal limits  TROPONIN I (HIGH SENSITIVITY) - Abnormal; Notable for the following components:   Troponin I (High Sensitivity) 26 (*)    All other components within normal limits  TROPONIN I (HIGH SENSITIVITY) - Abnormal; Notable for the following components:   Troponin I (High Sensitivity) 28 (*)    All other components within normal limits  CULTURE, BLOOD (ROUTINE X 2)  CULTURE, BLOOD (ROUTINE X 2)  I-STAT CG4 LACTIC ACID, ED  I-STAT VENOUS BLOOD GAS, ED  I-STAT CG4 LACTIC ACID, ED                                                                                                                          Radiology CT Renal Stone Study Result Date: 04/13/2024 CLINICAL DATA:  Shortness of breath nephrostomy catheter displacement tachypnea EXAM: CT ABDOMEN AND PELVIS WITHOUT CONTRAST TECHNIQUE: Multidetector CT imaging of the abdomen and pelvis was performed following the standard protocol without IV contrast. RADIATION DOSE REDUCTION: This exam was performed according to the departmental dose-optimization program which includes automated exposure control, adjustment of the mA and/or kV according to patient size and/or use of  iterative reconstruction technique. COMPARISON:  CT 01/13/2020 FINDINGS: Lower chest: Lung bases demonstrate subsegmental atelectasis or scarring at the bases. Small hiatal hernia Hepatobiliary: contracted gallbladder. No calcified stone. No biliary dilatation Pancreas: Unremarkable.  No pancreatic ductal dilatation or surrounding inflammatory changes. Spleen: Normal in size without focal abnormality. Adrenals/Urinary Tract: Adrenal glands are normal. Atrophic native left kidney. Right nephrectomy. Right lower quadrant transplanted kidney. Right lower quadrant percutaneous nephroureteral tube with pigtail formed in the decompressed urinary bladder. Right lower quadrant transplant kidney appears enlarged with moderate perinephric fat stranding. There is minimal right hydronephrosis. Stomach/Bowel: The stomach is nonenlarged. There is no dilated small bowel. No acute bowel wall thickening. Diverticular disease of the left colon Vascular/Lymphatic: Aortic atherosclerosis. No enlarged abdominal or pelvic lymph nodes. Reproductive: Prostate is unremarkable. Other: Negative for pelvic effusion or free air. Small fat containing inguinal hernias. Musculoskeletal: No acute osseous abnormality. Mild diffuse increased bony density which may be due to chronic kidney disease. IMPRESSION: 1. Status post right lower quadrant transplanted kidney. Transplanted kidney appears enlarged with moderate perinephric fat stranding of uncertain chronicity given lack of interval exams since 2021, correlate for any signs or symptoms of upper urinary tract infection. Right lower quadrant percutaneous nephroureteral tube with pigtail formed in the decompressed urinary bladder. There is minimal right hydronephrosis. 2. Diverticular disease of the left colon without acute inflammatory process. 3. Atrophic native left kidney. Right nephrectomy. 4. Aortic atherosclerosis. Aortic Atherosclerosis (ICD10-I70.0). Electronically Signed   By: Esmeralda Hedge M.D.   On: 04/13/2024 20:02    Pertinent labs & imaging results that were available during my care of the patient were reviewed by me and considered in my medical decision making (see MDM for details).  Medications Ordered in ED Medications  cefTRIAXone  (ROCEPHIN ) 2 g in  sodium chloride  0.9 % 100 mL IVPB (0 g Intravenous Stopped 04/13/24 1552)  lactated ringers  bolus 1,000 mL (0 mLs Intravenous Stopped 04/13/24 1712)  vancomycin  (VANCOCIN ) 2,000 mg in sodium chloride  0.9 % 500 mL IVPB (0 mg Intravenous Stopped 04/13/24 1941)  lactated ringers  bolus 1,000 mL (0 mLs Intravenous Stopped 04/13/24 1941)                                                                                                                                     Procedures Procedures  (including critical care time)  Medical Decision Making / ED Course   MDM:  68 year old presenting to the emergency department generalized weakness, feeling bad.  Patient appears mildly ill, in mild distress.  Vitals notable for mild hypotension  Concern for underlying infection given recent nephrostomy tube replacement.  He reports that he has had previous infections due to this.  Will send urinalysis although limited since it is left nephrostomy.  Blood cultures have also been obtained.  Will give small fluid bolus appears mildly dehydrated but does have CHF so would hold off on 30 mL/kg bolus.  Will cover  with broad-spectrum antibiotics.  Does have some shortness of breath, on auscultation lungs sound clear, will check chest x-ray.  He is on chronic Eliquis so doubt pulmonary embolism.  Will obtain CT scan to evaluate for intra-abdominal process such as abscess or nephrostomy tube dislodgment.  Will reassess.  Anticipate patient will need to be admitted.   Clinical Course as of 04/14/24 1514  Fri Apr 13, 2024  1656 Signed out to Dr. Bryna Car pending CMP, CT renal stone study.  [WS]    Clinical Course User Index [WS] Mordecai Applebaum, MD     Additional history obtained: -Additional history obtained from spouse -External records from outside source obtained and reviewed including: Chart review including previous notes, labs, imaging, consultation notes including prior [facility] notes    Lab  Tests: -I ordered, reviewed, and interpreted labs.   The pertinent results include:   Labs Reviewed  BLOOD CULTURE ID PANEL (REFLEXED) - BCID2 - Abnormal; Notable for the following components:      Result Value   Enterobacterales DETECTED (*)    Serratia marcescens DETECTED (*)    All other components within normal limits  CBC - Abnormal; Notable for the following components:   WBC 16.6 (*)    Hemoglobin 9.7 (*)    HCT 31.7 (*)    MCV 69.1 (*)    MCH 21.1 (*)    RDW 16.8 (*)    All other components within normal limits  PROTIME-INR - Abnormal; Notable for the following components:   Prothrombin Time 26.1 (*)    INR 2.4 (*)    All other components within normal limits  COMPREHENSIVE METABOLIC PANEL WITH GFR - Abnormal; Notable for the following components:   Sodium 127 (*)    Chloride 97 (*)    CO2 11 (*)    Glucose, Bld 454 (*)    BUN 119 (*)    Creatinine, Ser 14.57 (*)    Calcium  6.7 (*)    Albumin 2.4 (*)    Alkaline Phosphatase 148 (*)    GFR, Estimated 3 (*)    Anion gap 19 (*)    All other components within normal limits  BRAIN NATRIURETIC PEPTIDE - Abnormal; Notable for the following components:   B Natriuretic Peptide 181.6 (*)    All other components within normal limits  BLOOD GAS, VENOUS - Abnormal; Notable for the following components:   pH, Ven 7.2 (*)    pCO2, Ven 27 (*)    Bicarbonate 10.6 (*)    Acid-base deficit 15.9 (*)    All other components within normal limits  TROPONIN I (HIGH SENSITIVITY) - Abnormal; Notable for the following components:   Troponin I (High Sensitivity) 26 (*)    All other components within normal limits  TROPONIN I (HIGH SENSITIVITY) - Abnormal; Notable for the following components:   Troponin I (High Sensitivity) 28 (*)    All other components within normal limits  CULTURE, BLOOD (ROUTINE X 2)  CULTURE, BLOOD (ROUTINE X 2)  I-STAT CG4 LACTIC ACID, ED  I-STAT VENOUS BLOOD GAS, ED  I-STAT CG4 LACTIC ACID, ED    Notable for  leukocytosis   EKG   EKG Interpretation Date/Time:  Friday Apr 13 2024 14:16:12 EDT Ventricular Rate:  97 PR Interval:  148 QRS Duration:  93 QT Interval:  357 QTC Calculation: 454 R Axis:   -22  Text Interpretation: Sinus rhythm Inferior infarct, old Probable anterior infarct, age indeterminate Confirmed by Hiawatha Lout ([id number]) on 04/13/2024 4:05:35  PM         Imaging Studies ordered: I ordered imaging studies including CT renal stone On my interpretation imaging demonstrates pending at sign out     Medicines ordered and prescription drug management: Meds ordered this encounter  Medications   cefTRIAXone  (ROCEPHIN ) 2 g in sodium chloride  0.9 % 100 mL IVPB    Antibiotic Indication::   UTI   lactated ringers  bolus 1,000 mL   DISCONTD: vancomycin  (VANCOREADY) IVPB 2000 mg/400 mL    Indication::   Cellulitis   vancomycin  (VANCOCIN ) 2,000 mg in sodium chloride  0.9 % 500 mL IVPB   lactated ringers  bolus 1,000 mL    -I have reviewed the patients home medicines and have made adjustments as needed  Social Determinants of Health:  Diagnosis or treatment significantly limited by social determinants of health: obesity   Reevaluation: After the interventions noted above, I reevaluated the patient and found that their symptoms have improved  Co morbidities that complicate the patient evaluation  Past Medical History:  Diagnosis Date   Alcohol abuse    Congestive heart disease (HCC) 12/2011   Diabetes mellitus    GERD (gastroesophageal reflux disease)    Gout    Headache    Hepatitis    being treated for Hepatitis C   HOH (hard of hearing)    occasional wears hearing aids   Hyperlipidemia    Hyperlipidemia    Hypertension    Monoclonal gammopathies 02/25/2012   Obesity    Pedal edema    Pneumonia    Renal insufficiency    MWD - [facility]   Sleep apnea    does not use cpap   Tobacco abuse    Wears hearing aid    b/l      Dispostion: Disposition  decision including need for hospitalization was considered, and patient disposition pending at time of sign out.    Final Clinical Impression(s) / ED Diagnoses Final diagnoses:  Acute renal failure, unspecified acute renal failure type [facility])     This chart was dictated using voice recognition software.  Despite best efforts to proofread,  errors can occur which can change the documentation meaning.    Mordecai Applebaum, MD 04/14/24 [phone]

## 2024-04-13 NOTE — ED Provider Notes (Signed)
 Patient with acute renal failure.  Nephrostomy tube seems to be in the correct place.  Patient's creatinine was supposedly to just a few weeks ago.  I contacted [facility] and they have accepted the patient in transfer.  The accepting doctor is  Dr. Virgene Griffin, MD 04/13/24 2106

## 2024-04-14 LAB — BLOOD CULTURE ID PANEL (REFLEXED) - BCID2

## 2024-04-14 NOTE — ED Notes (Signed)
 04/14/24 1212 Received a call from [facility] at [facility], pt has 2 of 2 positive cultures Gram stain (-) rods. Information given to Mason Sole, [place] who reviewed chart, pt was admitted to [facility].

## 2024-04-16 LAB — CULTURE, BLOOD (ROUTINE X 2): Special Requests: ADEQUATE

## 2024-04-17 ENCOUNTER — Telehealth (HOSPITAL_BASED_OUTPATIENT_CLINIC_OR_DEPARTMENT_OTHER): Payer: Self-pay | Admitting: *Deleted

## 2024-04-17 NOTE — Telephone Encounter (Signed)
 Post ED Visit - Positive Culture Follow-up  Culture report reviewed by antimicrobial stewardship pharmacist: [facility] [x]  Denson Flake, Pharm.D. []  Skeet Duke, Pharm.D., BCPS AQ-ID []  Leslee Rase, Pharm.D., BCPS []  Garland Junk, Pharm.D., BCPS []  [place], [place].D., BCPS, AAHIVP []  Alcide Aly, Pharm.D., BCPS, AAHIVP []  Jerri Morale, PharmD, BCPS []  Graham Laws, PharmD, BCPS []  Cleda Curly, PharmD, BCPS []  Tamar Fairly, PharmD []  Ballard Levels, PharmD, BCPS []  Ollen Beverage, PharmD  [facility] []  Arlyne Bering, PharmD []  Sherryle Don, PharmD []  Van Gelinas, PharmD []  Delila Felty, Rph []  [place]) Cleora Daft, PharmD []  Augustina Block, PharmD []  Arie Kurtz, PharmD []  Sharlyn Deaner, PharmD []  Agnes Hose, PharmD []  Kendall Pauls, PharmD []  Gladstone Lamer, PharmD []  Armanda Bern, PharmD []  Tera Fellows, PharmD   Positive blood culture Patient currently admitted at [facility] no further patient follow-up is required at this time.  Lovell Rubenstein Sharion Davidson 04/17/2024, 11:55 AM

## 2024-04-28 DIAGNOSIS — N12 Tubulo-interstitial nephritis, not specified as acute or chronic: Secondary | ICD-10-CM | POA: Diagnosis not present

## 2024-04-28 DIAGNOSIS — Z992 Dependence on renal dialysis: Secondary | ICD-10-CM | POA: Diagnosis not present

## 2024-04-28 DIAGNOSIS — J9601 Acute respiratory failure with hypoxia: Secondary | ICD-10-CM | POA: Diagnosis not present

## 2024-04-28 DIAGNOSIS — N179 Acute kidney failure, unspecified: Secondary | ICD-10-CM | POA: Diagnosis not present

## 2024-04-28 DIAGNOSIS — G4733 Obstructive sleep apnea (adult) (pediatric): Secondary | ICD-10-CM | POA: Diagnosis not present

## 2024-04-28 DIAGNOSIS — I509 Heart failure, unspecified: Secondary | ICD-10-CM | POA: Diagnosis not present

## 2024-04-28 DIAGNOSIS — T8613 Kidney transplant infection: Secondary | ICD-10-CM | POA: Diagnosis not present

## 2024-04-28 DIAGNOSIS — I132 Hypertensive heart and chronic kidney disease with heart failure and with stage 5 chronic kidney disease, or end stage renal disease: Secondary | ICD-10-CM | POA: Diagnosis not present

## 2024-04-28 DIAGNOSIS — N186 End stage renal disease: Secondary | ICD-10-CM | POA: Diagnosis not present

## 2024-04-28 DIAGNOSIS — E1122 Type 2 diabetes mellitus with diabetic chronic kidney disease: Secondary | ICD-10-CM | POA: Diagnosis not present

## 2024-07-03 ENCOUNTER — Telehealth: Payer: Self-pay | Admitting: Pharmacist

## 2024-07-03 NOTE — Progress Notes (Signed)
   07/03/2024  Patient ID: [name], male   DOB: [birth year], 68 y.o.   MRN: [id number]  Called and spoke with the patient's wife on the phone this morning, per DPR. Patient is on [employer] DM list. Was only able to speak briefly as she is at work.  [facility]. Most recent A1c listings are as follows: [facility]: A1c 6/2 10.8% [facility]: 6/11 9.9% eGFR 27  Gets medications from the [place]. Had Nausea with Ozempic prior.  Believes he is doing much better than the last time seen and has lost some weight. Having frequent close follow-ups with the transplant team at this time adjusting medications and monitoring kidney function. Had kidney transplant in June 2023.   Upcoming appointments listed: 8/27: [facility] 9/30: OV with Dr. Seabron  Reports regarding the DM, he is doing well. Opted to stay with [facility] for management currently and MANY visits with transplant at this time.    Aloysius Lewis, PharmD [facility]  Phone Number: [phone]

## 2024-08-14 DIAGNOSIS — E1142 Type 2 diabetes mellitus with diabetic polyneuropathy: Secondary | ICD-10-CM | POA: Diagnosis not present

## 2024-08-14 DIAGNOSIS — E782 Mixed hyperlipidemia: Secondary | ICD-10-CM | POA: Diagnosis not present

## 2024-08-14 DIAGNOSIS — M109 Gout, unspecified: Secondary | ICD-10-CM | POA: Diagnosis not present

## 2024-08-14 DIAGNOSIS — I509 Heart failure, unspecified: Secondary | ICD-10-CM | POA: Diagnosis not present

## 2024-08-14 DIAGNOSIS — I4891 Unspecified atrial fibrillation: Secondary | ICD-10-CM | POA: Diagnosis not present

## 2024-08-14 DIAGNOSIS — D849 Immunodeficiency, unspecified: Secondary | ICD-10-CM | POA: Diagnosis not present

## 2024-08-14 DIAGNOSIS — Z940 Kidney transplant status: Secondary | ICD-10-CM | POA: Diagnosis not present

## 2024-08-14 DIAGNOSIS — N184 Chronic kidney disease, stage 4 (severe): Secondary | ICD-10-CM | POA: Diagnosis not present

## 2024-08-14 DIAGNOSIS — D472 Monoclonal gammopathy: Secondary | ICD-10-CM | POA: Diagnosis not present

## 2024-08-14 DIAGNOSIS — I130 Hypertensive heart and chronic kidney disease with heart failure and stage 1 through stage 4 chronic kidney disease, or unspecified chronic kidney disease: Secondary | ICD-10-CM | POA: Diagnosis not present

## 2024-08-14 DIAGNOSIS — M14672 Charcot's joint, left ankle and foot: Secondary | ICD-10-CM | POA: Diagnosis not present

## 2024-12-06 NOTE — Telephone Encounter (Signed)
 Returned call to patients spouse to assist with filling out new patient paperwork for upcoming appt with [facility]. Discussed local bela and encouraged them to determine timeline of how soon first infusion could be arranged locally as his [place] auth for our infusion center is expiring 12/26/24. Tentative plan would be to extend our auth 1 month and then have local infusion via new provider starting March. Spouse to follow up after tomorrows appt to confirm if reasonable or more time will be needed. Electronically signed by: Anette Jenkins Manners, RN 12/06/2024 2:20 PM
